# Patient Record
Sex: Female | Born: 1955
Health system: Southern US, Community
[De-identification: ages and names within clinical notes are randomized; demographics above are authoritative.]

## PROBLEM LIST (undated history)

## (undated) DIAGNOSIS — I739 Peripheral vascular disease, unspecified: Secondary | ICD-10-CM

## (undated) DIAGNOSIS — R519 Headache, unspecified: Secondary | ICD-10-CM

## (undated) DIAGNOSIS — M1712 Unilateral primary osteoarthritis, left knee: Secondary | ICD-10-CM

## (undated) DIAGNOSIS — G4730 Sleep apnea, unspecified: Secondary | ICD-10-CM

## (undated) DIAGNOSIS — M545 Low back pain, unspecified: Secondary | ICD-10-CM

## (undated) DIAGNOSIS — E785 Hyperlipidemia, unspecified: Secondary | ICD-10-CM

## (undated) DIAGNOSIS — N289 Disorder of kidney and ureter, unspecified: Secondary | ICD-10-CM

## (undated) DIAGNOSIS — M1990 Unspecified osteoarthritis, unspecified site: Secondary | ICD-10-CM

## (undated) DIAGNOSIS — E119 Type 2 diabetes mellitus without complications: Secondary | ICD-10-CM

## (undated) DIAGNOSIS — K219 Gastro-esophageal reflux disease without esophagitis: Secondary | ICD-10-CM

## (undated) DIAGNOSIS — Z950 Presence of cardiac pacemaker: Secondary | ICD-10-CM

## (undated) DIAGNOSIS — Z973 Presence of spectacles and contact lenses: Secondary | ICD-10-CM

## (undated) DIAGNOSIS — F329 Major depressive disorder, single episode, unspecified: Secondary | ICD-10-CM

## (undated) DIAGNOSIS — E1149 Type 2 diabetes mellitus with other diabetic neurological complication: Secondary | ICD-10-CM

## (undated) DIAGNOSIS — Z862 Personal history of diseases of the blood and blood-forming organs and certain disorders involving the immune mechanism: Secondary | ICD-10-CM

## (undated) DIAGNOSIS — K7581 Nonalcoholic steatohepatitis (NASH): Secondary | ICD-10-CM

## (undated) DIAGNOSIS — Z9289 Personal history of other medical treatment: Secondary | ICD-10-CM

## (undated) DIAGNOSIS — M7500 Adhesive capsulitis of unspecified shoulder: Secondary | ICD-10-CM

## (undated) DIAGNOSIS — E042 Nontoxic multinodular goiter: Secondary | ICD-10-CM

## (undated) DIAGNOSIS — F419 Anxiety disorder, unspecified: Secondary | ICD-10-CM

## (undated) DIAGNOSIS — G4733 Obstructive sleep apnea (adult) (pediatric): Secondary | ICD-10-CM

## (undated) DIAGNOSIS — L408 Other psoriasis: Secondary | ICD-10-CM

## (undated) DIAGNOSIS — D649 Anemia, unspecified: Secondary | ICD-10-CM

## (undated) DIAGNOSIS — E7800 Pure hypercholesterolemia, unspecified: Secondary | ICD-10-CM

## (undated) DIAGNOSIS — E1140 Type 2 diabetes mellitus with diabetic neuropathy, unspecified: Secondary | ICD-10-CM

## (undated) DIAGNOSIS — T7840XA Allergy, unspecified, initial encounter: Secondary | ICD-10-CM

## (undated) DIAGNOSIS — G609 Hereditary and idiopathic neuropathy, unspecified: Secondary | ICD-10-CM

## (undated) DIAGNOSIS — I872 Venous insufficiency (chronic) (peripheral): Secondary | ICD-10-CM

## (undated) DIAGNOSIS — H269 Unspecified cataract: Secondary | ICD-10-CM

## (undated) DIAGNOSIS — Z86010 Personal history of colonic polyps: Secondary | ICD-10-CM

## (undated) DIAGNOSIS — Z972 Presence of dental prosthetic device (complete) (partial): Secondary | ICD-10-CM

## (undated) DIAGNOSIS — K08109 Complete loss of teeth, unspecified cause, unspecified class: Secondary | ICD-10-CM

## (undated) DIAGNOSIS — Z800 Family history of malignant neoplasm of digestive organs: Secondary | ICD-10-CM

## (undated) DIAGNOSIS — Z780 Asymptomatic menopausal state: Secondary | ICD-10-CM

## (undated) DIAGNOSIS — J189 Pneumonia, unspecified organism: Secondary | ICD-10-CM

## (undated) DIAGNOSIS — I10 Essential (primary) hypertension: Secondary | ICD-10-CM

## (undated) DIAGNOSIS — R151 Fecal smearing: Secondary | ICD-10-CM

## (undated) DIAGNOSIS — L03116 Cellulitis of left lower limb: Secondary | ICD-10-CM

## (undated) DIAGNOSIS — R159 Full incontinence of feces: Secondary | ICD-10-CM

## (undated) DIAGNOSIS — R51 Headache: Secondary | ICD-10-CM

## (undated) DIAGNOSIS — L03115 Cellulitis of right lower limb: Secondary | ICD-10-CM

## (undated) HISTORY — DX: Hyperlipidemia, unspecified: E78.5

## (undated) HISTORY — DX: Unspecified cataract: H26.9

## (undated) HISTORY — DX: Family history of malignant neoplasm of digestive organs: Z80.0

## (undated) HISTORY — DX: Presence of cardiac pacemaker: Z95.0

## (undated) HISTORY — DX: Hereditary and idiopathic neuropathy, unspecified: G60.9

## (undated) HISTORY — DX: Anxiety disorder, unspecified: F41.9

## (undated) HISTORY — DX: Personal history of colonic polyps: Z86.010

## (undated) HISTORY — DX: Other psoriasis: L40.8

## (undated) HISTORY — DX: Unilateral primary osteoarthritis, left knee: M17.12

## (undated) HISTORY — PX: LEG SURGERY: SHX1003

## (undated) HISTORY — PX: FLEXIBLE SIGMOIDOSCOPY: SHX1649

## (undated) HISTORY — DX: Nonalcoholic steatohepatitis (NASH): K75.81

## (undated) HISTORY — PX: OTHER SURGICAL HISTORY: SHX169

## (undated) HISTORY — PX: JOINT REPLACEMENT: SHX530

## (undated) HISTORY — DX: Venous insufficiency (chronic) (peripheral): I87.2

## (undated) HISTORY — DX: Pure hypercholesterolemia, unspecified: E78.00

## (undated) HISTORY — DX: Type 2 diabetes mellitus with other diabetic neurological complication: E11.49

## (undated) HISTORY — DX: Cellulitis of left lower limb: L03.116

## (undated) HISTORY — PX: VARICOSE VEIN SURGERY: SHX832

## (undated) HISTORY — DX: Unspecified osteoarthritis, unspecified site: M19.90

## (undated) HISTORY — DX: Essential (primary) hypertension: I10

## (undated) HISTORY — PX: COLONOSCOPY: SHX174

## (undated) HISTORY — DX: Obstructive sleep apnea (adult) (pediatric): G47.33

## (undated) HISTORY — DX: Allergy, unspecified, initial encounter: T78.40XA

## (undated) HISTORY — PX: GASTRIC BYPASS: SHX52

## (undated) HISTORY — PX: CATARACT EXTRACTION W/ INTRAOCULAR LENS  IMPLANT, BILATERAL: SHX1307

## (undated) HISTORY — DX: Nontoxic multinodular goiter: E04.2

## (undated) HISTORY — DX: Sleep apnea, unspecified: G47.30

## (undated) HISTORY — PX: MULTIPLE TOOTH EXTRACTIONS: SHX2053

## (undated) HISTORY — PX: SHOULDER ARTHROSCOPY W/ ROTATOR CUFF REPAIR: SHX2400

## (undated) HISTORY — DX: Cellulitis of right lower limb: L03.115

## (undated) HISTORY — PX: TONSILLECTOMY: SUR1361

## (undated) HISTORY — DX: Personal history of diseases of the blood and blood-forming organs and certain disorders involving the immune mechanism: Z86.2

## (undated) HISTORY — DX: Major depressive disorder, single episode, unspecified: F32.9

## (undated) HISTORY — DX: Low back pain: M54.5

## (undated) HISTORY — DX: Asymptomatic menopausal state: Z78.0

---

## 1998-10-16 ENCOUNTER — Encounter: Admission: RE | Admit: 1998-10-16 | Discharge: 1999-01-14 | Payer: Self-pay | Admitting: Family Medicine

## 1999-07-06 ENCOUNTER — Other Ambulatory Visit: Admission: RE | Admit: 1999-07-06 | Discharge: 1999-07-06 | Payer: Self-pay | Admitting: Obstetrics and Gynecology

## 1999-08-13 ENCOUNTER — Encounter: Payer: Self-pay | Admitting: Obstetrics and Gynecology

## 1999-08-13 ENCOUNTER — Ambulatory Visit (HOSPITAL_COMMUNITY): Admission: RE | Admit: 1999-08-13 | Discharge: 1999-08-13 | Payer: Self-pay | Admitting: Obstetrics and Gynecology

## 2000-05-07 ENCOUNTER — Encounter: Payer: Self-pay | Admitting: Orthopedic Surgery

## 2000-05-07 ENCOUNTER — Ambulatory Visit (HOSPITAL_COMMUNITY): Admission: RE | Admit: 2000-05-07 | Discharge: 2000-05-07 | Payer: Self-pay | Admitting: Orthopedic Surgery

## 2000-05-22 ENCOUNTER — Encounter: Payer: Self-pay | Admitting: Orthopedic Surgery

## 2000-05-22 ENCOUNTER — Ambulatory Visit (HOSPITAL_COMMUNITY): Admission: RE | Admit: 2000-05-22 | Discharge: 2000-05-22 | Payer: Self-pay | Admitting: Orthopedic Surgery

## 2000-05-22 ENCOUNTER — Ambulatory Visit (HOSPITAL_COMMUNITY): Admission: RE | Admit: 2000-05-22 | Discharge: 2000-05-22 | Payer: Self-pay | Admitting: Gastroenterology

## 2000-05-22 ENCOUNTER — Encounter: Payer: Self-pay | Admitting: Gastroenterology

## 2000-05-29 ENCOUNTER — Encounter: Admission: RE | Admit: 2000-05-29 | Discharge: 2000-08-27 | Payer: Self-pay | Admitting: Family Medicine

## 2000-06-23 ENCOUNTER — Encounter: Admission: RE | Admit: 2000-06-23 | Discharge: 2000-09-21 | Payer: Self-pay | Admitting: Orthopedic Surgery

## 2000-10-22 ENCOUNTER — Encounter: Admission: RE | Admit: 2000-10-22 | Discharge: 2000-11-12 | Payer: Self-pay | Admitting: Orthopedic Surgery

## 2001-01-23 ENCOUNTER — Encounter: Payer: Self-pay | Admitting: Obstetrics and Gynecology

## 2001-01-23 ENCOUNTER — Ambulatory Visit (HOSPITAL_COMMUNITY): Admission: RE | Admit: 2001-01-23 | Discharge: 2001-01-23 | Payer: Self-pay | Admitting: Obstetrics and Gynecology

## 2001-06-24 HISTORY — PX: KNEE ARTHROSCOPY: SUR90

## 2004-06-24 HISTORY — PX: CARDIAC CATHETERIZATION: SHX172

## 2004-09-24 ENCOUNTER — Ambulatory Visit: Payer: Self-pay | Admitting: Endocrinology

## 2004-09-25 ENCOUNTER — Encounter: Admission: RE | Admit: 2004-09-25 | Discharge: 2004-12-24 | Payer: Self-pay | Admitting: Endocrinology

## 2004-09-26 ENCOUNTER — Ambulatory Visit (HOSPITAL_COMMUNITY): Admission: RE | Admit: 2004-09-26 | Discharge: 2004-09-26 | Payer: Self-pay | Admitting: Endocrinology

## 2004-10-09 ENCOUNTER — Ambulatory Visit: Payer: Self-pay | Admitting: Endocrinology

## 2004-10-23 ENCOUNTER — Ambulatory Visit: Payer: Self-pay | Admitting: Endocrinology

## 2004-11-13 ENCOUNTER — Ambulatory Visit: Payer: Self-pay | Admitting: Endocrinology

## 2004-11-29 ENCOUNTER — Ambulatory Visit (HOSPITAL_COMMUNITY): Admission: RE | Admit: 2004-11-29 | Discharge: 2004-11-29 | Payer: Self-pay | Admitting: Family Medicine

## 2004-12-04 ENCOUNTER — Ambulatory Visit: Payer: Self-pay | Admitting: Endocrinology

## 2005-01-15 ENCOUNTER — Ambulatory Visit: Payer: Self-pay | Admitting: Endocrinology

## 2005-01-18 ENCOUNTER — Ambulatory Visit: Payer: Self-pay | Admitting: Endocrinology

## 2005-01-24 ENCOUNTER — Ambulatory Visit: Payer: Self-pay

## 2005-01-24 HISTORY — PX: OTHER SURGICAL HISTORY: SHX169

## 2005-02-08 ENCOUNTER — Ambulatory Visit: Payer: Self-pay | Admitting: Cardiology

## 2005-02-12 ENCOUNTER — Ambulatory Visit: Payer: Self-pay | Admitting: Endocrinology

## 2005-02-15 ENCOUNTER — Inpatient Hospital Stay (HOSPITAL_BASED_OUTPATIENT_CLINIC_OR_DEPARTMENT_OTHER): Admission: RE | Admit: 2005-02-15 | Discharge: 2005-02-15 | Payer: Self-pay

## 2005-02-15 ENCOUNTER — Ambulatory Visit: Payer: Self-pay | Admitting: Cardiology

## 2005-03-15 ENCOUNTER — Ambulatory Visit: Payer: Self-pay | Admitting: Endocrinology

## 2005-06-05 ENCOUNTER — Ambulatory Visit: Payer: Self-pay | Admitting: Internal Medicine

## 2005-08-06 ENCOUNTER — Emergency Department (HOSPITAL_COMMUNITY): Admission: EM | Admit: 2005-08-06 | Discharge: 2005-08-07 | Payer: Self-pay | Admitting: Emergency Medicine

## 2005-08-20 ENCOUNTER — Ambulatory Visit: Payer: Self-pay | Admitting: Gastroenterology

## 2005-08-21 ENCOUNTER — Ambulatory Visit: Payer: Self-pay | Admitting: Endocrinology

## 2005-08-25 ENCOUNTER — Emergency Department (HOSPITAL_COMMUNITY): Admission: EM | Admit: 2005-08-25 | Discharge: 2005-08-25 | Payer: Self-pay | Admitting: Emergency Medicine

## 2005-09-05 ENCOUNTER — Ambulatory Visit: Payer: Self-pay | Admitting: Endocrinology

## 2005-09-12 ENCOUNTER — Encounter (INDEPENDENT_AMBULATORY_CARE_PROVIDER_SITE_OTHER): Payer: Self-pay | Admitting: Specialist

## 2005-09-12 ENCOUNTER — Ambulatory Visit: Payer: Self-pay | Admitting: Gastroenterology

## 2006-02-26 ENCOUNTER — Ambulatory Visit: Payer: Self-pay | Admitting: Endocrinology

## 2006-03-05 ENCOUNTER — Ambulatory Visit: Payer: Self-pay | Admitting: Endocrinology

## 2006-03-12 ENCOUNTER — Ambulatory Visit: Payer: Self-pay | Admitting: Endocrinology

## 2006-03-12 ENCOUNTER — Other Ambulatory Visit: Admission: RE | Admit: 2006-03-12 | Discharge: 2006-03-12 | Payer: Self-pay | Admitting: Obstetrics and Gynecology

## 2006-04-09 ENCOUNTER — Ambulatory Visit: Payer: Self-pay | Admitting: Endocrinology

## 2006-04-09 LAB — CONVERTED CEMR LAB
Albumin: 3.6 g/dL (ref 3.5–5.2)
Alkaline Phosphatase: 123 units/L — ABNORMAL HIGH (ref 39–117)
BUN: 21 mg/dL (ref 6–23)
Basophils Absolute: 0 10*3/uL (ref 0.0–0.1)
Bilirubin Urine: NEGATIVE
Cholesterol: 163 mg/dL (ref 0–200)
Creatinine, Ser: 1.1 mg/dL (ref 0.4–1.2)
Creatinine,U: 53.2 mg/dL
Glomerular Filtration Rate, Af Am: 67 mL/min/{1.73_m2}
HDL: 37.9 mg/dL — ABNORMAL LOW (ref >39.0)
Ketones, ur: NEGATIVE mg/dL
Leukocytes, UA: NEGATIVE
Lymphocytes Relative: 20.4 % (ref 12.0–46.0)
MCHC: 33.4 g/dL (ref 30.0–36.0)
MCV: 81.5 fL (ref 78.0–100.0)
Microalb Creat Ratio: 13.2 mg/g (ref 0.0–30.0)
Monocytes Absolute: 0.5 10*3/uL (ref 0.2–0.7)
Nitrite: NEGATIVE
Platelets: 260 10*3/uL (ref 150–400)
TSH: 3.4 microintl units/mL (ref 0.35–5.50)
Total Bilirubin: 0.6 mg/dL (ref 0.3–1.2)
Total Protein, Urine: NEGATIVE mg/dL
Total Protein: 7.1 g/dL (ref 6.0–8.3)

## 2006-04-16 ENCOUNTER — Ambulatory Visit: Payer: Self-pay | Admitting: Endocrinology

## 2006-04-16 HISTORY — PX: ELECTROCARDIOGRAM: SHX264

## 2006-05-14 ENCOUNTER — Ambulatory Visit: Payer: Self-pay | Admitting: Endocrinology

## 2006-06-11 ENCOUNTER — Ambulatory Visit: Payer: Self-pay | Admitting: Endocrinology

## 2006-06-24 HISTORY — PX: ROTATOR CUFF REPAIR: SHX139

## 2006-07-09 ENCOUNTER — Ambulatory Visit: Payer: Self-pay | Admitting: Endocrinology

## 2006-07-12 ENCOUNTER — Ambulatory Visit: Payer: Self-pay | Admitting: Family Medicine

## 2006-07-15 ENCOUNTER — Ambulatory Visit: Payer: Self-pay | Admitting: Endocrinology

## 2006-07-30 ENCOUNTER — Ambulatory Visit: Payer: Self-pay | Admitting: Endocrinology

## 2006-09-03 ENCOUNTER — Encounter: Payer: Self-pay | Admitting: Vascular Surgery

## 2006-09-03 ENCOUNTER — Ambulatory Visit: Payer: Self-pay | Admitting: Vascular Surgery

## 2006-09-03 ENCOUNTER — Ambulatory Visit (HOSPITAL_COMMUNITY): Admission: RE | Admit: 2006-09-03 | Discharge: 2006-09-03 | Payer: Self-pay | Admitting: Endocrinology

## 2006-09-03 ENCOUNTER — Ambulatory Visit: Payer: Self-pay | Admitting: Endocrinology

## 2006-09-10 ENCOUNTER — Ambulatory Visit: Payer: Self-pay | Admitting: Endocrinology

## 2006-10-08 ENCOUNTER — Ambulatory Visit: Payer: Self-pay | Admitting: Endocrinology

## 2006-10-08 LAB — CONVERTED CEMR LAB
ALT: 19 units/L (ref 0–40)
Albumin: 3.6 g/dL (ref 3.5–5.2)
HDL: 40.2 mg/dL (ref >39.0)
Hgb A1c MFr Bld: 11 % — ABNORMAL HIGH (ref 4.6–6.0)
Total CHOL/HDL Ratio: 4.3
Total Protein: 7.6 g/dL (ref 6.0–8.3)
Triglycerides: 210 mg/dL (ref 0–149)
VLDL: 42 mg/dL — ABNORMAL HIGH (ref 0–40)

## 2006-12-12 ENCOUNTER — Ambulatory Visit: Payer: Self-pay | Admitting: Endocrinology

## 2006-12-12 LAB — CONVERTED CEMR LAB
ALT: 21 units/L (ref 0–40)
AST: 18 units/L (ref 0–37)
Alkaline Phosphatase: 154 units/L — ABNORMAL HIGH (ref 39–117)
Bilirubin, Direct: 0.1 mg/dL (ref 0.0–0.3)
Hgb A1c MFr Bld: 11.7 % — ABNORMAL HIGH (ref 4.6–6.0)
LDL Cholesterol: 98 mg/dL (ref 0–99)
Total Bilirubin: 0.3 mg/dL (ref 0.3–1.2)
Total CHOL/HDL Ratio: 4.8

## 2006-12-17 ENCOUNTER — Ambulatory Visit: Payer: Self-pay | Admitting: Endocrinology

## 2006-12-24 ENCOUNTER — Ambulatory Visit: Payer: Self-pay | Admitting: Internal Medicine

## 2007-01-12 ENCOUNTER — Ambulatory Visit: Payer: Self-pay | Admitting: Endocrinology

## 2007-01-19 ENCOUNTER — Encounter: Payer: Self-pay | Admitting: Endocrinology

## 2007-03-24 ENCOUNTER — Ambulatory Visit: Payer: Self-pay | Admitting: Pulmonary Disease

## 2007-04-06 ENCOUNTER — Encounter: Payer: Self-pay | Admitting: Pulmonary Disease

## 2007-04-06 ENCOUNTER — Ambulatory Visit (HOSPITAL_BASED_OUTPATIENT_CLINIC_OR_DEPARTMENT_OTHER): Admission: RE | Admit: 2007-04-06 | Discharge: 2007-04-06 | Payer: Self-pay | Admitting: Pulmonary Disease

## 2007-04-10 ENCOUNTER — Ambulatory Visit: Payer: Self-pay | Admitting: Endocrinology

## 2007-04-14 ENCOUNTER — Ambulatory Visit: Payer: Self-pay | Admitting: Pulmonary Disease

## 2007-04-23 ENCOUNTER — Telehealth: Payer: Self-pay | Admitting: Endocrinology

## 2007-05-08 ENCOUNTER — Ambulatory Visit: Payer: Self-pay | Admitting: Pulmonary Disease

## 2007-05-12 ENCOUNTER — Ambulatory Visit: Payer: Self-pay | Admitting: Endocrinology

## 2007-05-12 DIAGNOSIS — M79609 Pain in unspecified limb: Secondary | ICD-10-CM

## 2007-05-13 ENCOUNTER — Encounter: Payer: Self-pay | Admitting: Pulmonary Disease

## 2007-05-13 DIAGNOSIS — E042 Nontoxic multinodular goiter: Secondary | ICD-10-CM | POA: Insufficient documentation

## 2007-05-13 DIAGNOSIS — Z862 Personal history of diseases of the blood and blood-forming organs and certain disorders involving the immune mechanism: Secondary | ICD-10-CM | POA: Insufficient documentation

## 2007-05-13 DIAGNOSIS — E785 Hyperlipidemia, unspecified: Secondary | ICD-10-CM

## 2007-05-13 HISTORY — DX: Nontoxic multinodular goiter: E04.2

## 2007-05-13 HISTORY — DX: Hyperlipidemia, unspecified: E78.5

## 2007-05-13 HISTORY — DX: Personal history of diseases of the blood and blood-forming organs and certain disorders involving the immune mechanism: Z86.2

## 2007-06-02 ENCOUNTER — Encounter: Payer: Self-pay | Admitting: Endocrinology

## 2007-06-02 ENCOUNTER — Ambulatory Visit: Payer: Self-pay

## 2007-06-12 ENCOUNTER — Ambulatory Visit: Payer: Self-pay | Admitting: Pulmonary Disease

## 2007-06-12 DIAGNOSIS — G4733 Obstructive sleep apnea (adult) (pediatric): Secondary | ICD-10-CM

## 2007-06-12 DIAGNOSIS — E669 Obesity, unspecified: Secondary | ICD-10-CM | POA: Insufficient documentation

## 2007-06-12 HISTORY — DX: Obstructive sleep apnea (adult) (pediatric): G47.33

## 2007-07-06 ENCOUNTER — Encounter: Payer: Self-pay | Admitting: Pulmonary Disease

## 2007-09-14 ENCOUNTER — Emergency Department (HOSPITAL_COMMUNITY): Admission: EM | Admit: 2007-09-14 | Discharge: 2007-09-15 | Payer: Self-pay | Admitting: Emergency Medicine

## 2007-09-14 ENCOUNTER — Telehealth (INDEPENDENT_AMBULATORY_CARE_PROVIDER_SITE_OTHER): Payer: Self-pay | Admitting: *Deleted

## 2007-09-15 ENCOUNTER — Encounter (INDEPENDENT_AMBULATORY_CARE_PROVIDER_SITE_OTHER): Payer: Self-pay | Admitting: *Deleted

## 2007-09-15 ENCOUNTER — Ambulatory Visit: Payer: Self-pay | Admitting: Endocrinology

## 2007-09-15 DIAGNOSIS — M25569 Pain in unspecified knee: Secondary | ICD-10-CM

## 2007-09-15 DIAGNOSIS — R51 Headache: Secondary | ICD-10-CM

## 2007-09-15 DIAGNOSIS — R519 Headache, unspecified: Secondary | ICD-10-CM | POA: Insufficient documentation

## 2007-09-18 ENCOUNTER — Telehealth (INDEPENDENT_AMBULATORY_CARE_PROVIDER_SITE_OTHER): Payer: Self-pay | Admitting: *Deleted

## 2007-09-21 ENCOUNTER — Ambulatory Visit: Payer: Self-pay | Admitting: Endocrinology

## 2007-09-22 LAB — CONVERTED CEMR LAB: Sed Rate: 67 mm/hr — ABNORMAL HIGH (ref 0–22)

## 2007-09-23 ENCOUNTER — Telehealth: Payer: Self-pay | Admitting: Endocrinology

## 2007-09-23 ENCOUNTER — Encounter: Payer: Self-pay | Admitting: Endocrinology

## 2007-09-24 ENCOUNTER — Telehealth: Payer: Self-pay | Admitting: Endocrinology

## 2007-09-28 ENCOUNTER — Ambulatory Visit: Payer: Self-pay | Admitting: Endocrinology

## 2007-10-06 ENCOUNTER — Inpatient Hospital Stay (HOSPITAL_COMMUNITY): Admission: AD | Admit: 2007-10-06 | Discharge: 2007-10-09 | Payer: Self-pay | Admitting: Internal Medicine

## 2007-10-06 ENCOUNTER — Ambulatory Visit: Payer: Self-pay | Admitting: Internal Medicine

## 2007-10-06 DIAGNOSIS — IMO0002 Reserved for concepts with insufficient information to code with codable children: Secondary | ICD-10-CM

## 2007-10-06 DIAGNOSIS — F329 Major depressive disorder, single episode, unspecified: Secondary | ICD-10-CM

## 2007-10-06 DIAGNOSIS — F3289 Other specified depressive episodes: Secondary | ICD-10-CM

## 2007-10-06 DIAGNOSIS — F32A Depression, unspecified: Secondary | ICD-10-CM | POA: Insufficient documentation

## 2007-10-06 HISTORY — DX: Major depressive disorder, single episode, unspecified: F32.9

## 2007-10-06 HISTORY — DX: Depression, unspecified: F32.A

## 2007-10-06 HISTORY — DX: Other specified depressive episodes: F32.89

## 2007-10-07 ENCOUNTER — Encounter (INDEPENDENT_AMBULATORY_CARE_PROVIDER_SITE_OTHER): Payer: Self-pay | Admitting: General Surgery

## 2007-10-16 ENCOUNTER — Ambulatory Visit: Payer: Self-pay | Admitting: Endocrinology

## 2007-10-29 ENCOUNTER — Encounter: Payer: Self-pay | Admitting: Endocrinology

## 2007-11-19 ENCOUNTER — Ambulatory Visit: Payer: Self-pay | Admitting: Endocrinology

## 2007-11-19 DIAGNOSIS — M545 Low back pain, unspecified: Secondary | ICD-10-CM

## 2007-11-19 DIAGNOSIS — M25519 Pain in unspecified shoulder: Secondary | ICD-10-CM

## 2007-11-19 HISTORY — DX: Low back pain, unspecified: M54.50

## 2007-11-20 ENCOUNTER — Encounter: Payer: Self-pay | Admitting: Endocrinology

## 2007-11-23 ENCOUNTER — Telehealth (INDEPENDENT_AMBULATORY_CARE_PROVIDER_SITE_OTHER): Payer: Self-pay | Admitting: *Deleted

## 2007-11-30 ENCOUNTER — Telehealth (INDEPENDENT_AMBULATORY_CARE_PROVIDER_SITE_OTHER): Payer: Self-pay | Admitting: *Deleted

## 2007-12-03 ENCOUNTER — Encounter: Payer: Self-pay | Admitting: Endocrinology

## 2007-12-10 ENCOUNTER — Ambulatory Visit (HOSPITAL_COMMUNITY): Admission: RE | Admit: 2007-12-10 | Discharge: 2007-12-11 | Payer: Self-pay | Admitting: Orthopedic Surgery

## 2007-12-17 ENCOUNTER — Ambulatory Visit: Payer: Self-pay | Admitting: Endocrinology

## 2007-12-24 ENCOUNTER — Encounter: Admission: RE | Admit: 2007-12-24 | Discharge: 2007-12-24 | Payer: Self-pay | Admitting: Neurology

## 2008-01-25 ENCOUNTER — Ambulatory Visit (HOSPITAL_COMMUNITY): Admission: RE | Admit: 2008-01-25 | Discharge: 2008-01-25 | Payer: Self-pay | Admitting: Obstetrics and Gynecology

## 2008-01-25 LAB — HM MAMMOGRAPHY: HM Mammogram: NORMAL

## 2008-01-29 ENCOUNTER — Encounter (INDEPENDENT_AMBULATORY_CARE_PROVIDER_SITE_OTHER): Payer: Self-pay | Admitting: *Deleted

## 2008-01-29 ENCOUNTER — Ambulatory Visit: Payer: Self-pay | Admitting: Endocrinology

## 2008-01-29 DIAGNOSIS — R209 Unspecified disturbances of skin sensation: Secondary | ICD-10-CM

## 2008-01-31 ENCOUNTER — Encounter: Admission: RE | Admit: 2008-01-31 | Discharge: 2008-01-31 | Payer: Self-pay | Admitting: Orthopedic Surgery

## 2008-02-02 ENCOUNTER — Encounter: Payer: Self-pay | Admitting: Endocrinology

## 2008-02-03 ENCOUNTER — Telehealth (INDEPENDENT_AMBULATORY_CARE_PROVIDER_SITE_OTHER): Payer: Self-pay | Admitting: *Deleted

## 2008-02-05 ENCOUNTER — Telehealth (INDEPENDENT_AMBULATORY_CARE_PROVIDER_SITE_OTHER): Payer: Self-pay | Admitting: *Deleted

## 2008-02-10 ENCOUNTER — Encounter: Payer: Self-pay | Admitting: Endocrinology

## 2008-02-11 ENCOUNTER — Ambulatory Visit: Payer: Self-pay | Admitting: Endocrinology

## 2008-02-11 LAB — CONVERTED CEMR LAB
ALT: 41 units/L — ABNORMAL HIGH (ref 0–35)
AST: 58 units/L — ABNORMAL HIGH (ref 0–37)
Alkaline Phosphatase: 167 units/L — ABNORMAL HIGH (ref 39–117)
Calcium: 9.6 mg/dL (ref 8.4–10.5)
Chloride: 104 meq/L (ref 96–112)
Creatinine, Ser: 1.1 mg/dL (ref 0.4–1.2)
Creatinine,U: 133.7 mg/dL
Eosinophils Absolute: 0.2 10*3/uL (ref 0.0–0.7)
Eosinophils Relative: 3.1 % (ref 0.0–5.0)
HCT: 41 % (ref 36.0–46.0)
Hemoglobin, Urine: NEGATIVE
Leukocytes, UA: NEGATIVE
Lymphocytes Relative: 25.8 % (ref 12.0–46.0)
Microalb Creat Ratio: 28.4 mg/g (ref 0.0–30.0)
Monocytes Relative: 7.4 % (ref 3.0–12.0)
Neutro Abs: 4.9 10*3/uL (ref 1.4–7.7)
Nitrite: NEGATIVE
Potassium: 3.4 meq/L — ABNORMAL LOW (ref 3.5–5.1)
Sodium: 141 meq/L (ref 135–145)
Specific Gravity, Urine: 1.025 (ref 1.000–1.03)
Total Bilirubin: 0.5 mg/dL (ref 0.3–1.2)
Total Protein, Urine: NEGATIVE mg/dL
Triglycerides: 185 mg/dL — ABNORMAL HIGH (ref 0–149)
Urine Glucose: NEGATIVE mg/dL
VLDL: 37 mg/dL (ref 0–40)

## 2008-02-12 ENCOUNTER — Encounter: Payer: Self-pay | Admitting: Internal Medicine

## 2008-02-15 LAB — CONVERTED CEMR LAB
HCV Ab: NEGATIVE
Hep A IgM: NEGATIVE

## 2008-02-18 ENCOUNTER — Telehealth: Payer: Self-pay | Admitting: Endocrinology

## 2008-02-18 ENCOUNTER — Encounter: Payer: Self-pay | Admitting: Endocrinology

## 2008-02-18 ENCOUNTER — Ambulatory Visit: Payer: Self-pay | Admitting: Endocrinology

## 2008-02-18 DIAGNOSIS — Z780 Asymptomatic menopausal state: Secondary | ICD-10-CM | POA: Insufficient documentation

## 2008-02-18 DIAGNOSIS — E1140 Type 2 diabetes mellitus with diabetic neuropathy, unspecified: Secondary | ICD-10-CM | POA: Insufficient documentation

## 2008-02-18 DIAGNOSIS — E1149 Type 2 diabetes mellitus with other diabetic neurological complication: Secondary | ICD-10-CM

## 2008-02-18 HISTORY — DX: Asymptomatic menopausal state: Z78.0

## 2008-02-18 HISTORY — DX: Type 2 diabetes mellitus with diabetic neuropathy, unspecified: E11.40

## 2008-02-18 HISTORY — DX: Type 2 diabetes mellitus with other diabetic neurological complication: E11.49

## 2008-02-22 ENCOUNTER — Ambulatory Visit (HOSPITAL_COMMUNITY): Admission: RE | Admit: 2008-02-22 | Discharge: 2008-02-22 | Payer: Self-pay | Admitting: Endocrinology

## 2008-02-22 ENCOUNTER — Telehealth (INDEPENDENT_AMBULATORY_CARE_PROVIDER_SITE_OTHER): Payer: Self-pay | Admitting: *Deleted

## 2008-02-22 ENCOUNTER — Encounter: Payer: Self-pay | Admitting: Endocrinology

## 2008-03-07 ENCOUNTER — Encounter: Payer: Self-pay | Admitting: Endocrinology

## 2008-04-22 ENCOUNTER — Emergency Department (HOSPITAL_COMMUNITY): Admission: EM | Admit: 2008-04-22 | Discharge: 2008-04-22 | Payer: Self-pay | Admitting: Emergency Medicine

## 2008-04-28 ENCOUNTER — Telehealth (INDEPENDENT_AMBULATORY_CARE_PROVIDER_SITE_OTHER): Payer: Self-pay | Admitting: *Deleted

## 2008-08-19 ENCOUNTER — Telehealth (INDEPENDENT_AMBULATORY_CARE_PROVIDER_SITE_OTHER): Payer: Self-pay | Admitting: *Deleted

## 2008-08-22 ENCOUNTER — Telehealth (INDEPENDENT_AMBULATORY_CARE_PROVIDER_SITE_OTHER): Payer: Self-pay | Admitting: *Deleted

## 2008-11-04 ENCOUNTER — Telehealth (INDEPENDENT_AMBULATORY_CARE_PROVIDER_SITE_OTHER): Payer: Self-pay | Admitting: *Deleted

## 2008-11-08 ENCOUNTER — Ambulatory Visit: Payer: Self-pay | Admitting: Endocrinology

## 2008-12-16 ENCOUNTER — Telehealth (INDEPENDENT_AMBULATORY_CARE_PROVIDER_SITE_OTHER): Payer: Self-pay | Admitting: *Deleted

## 2008-12-20 ENCOUNTER — Telehealth: Payer: Self-pay | Admitting: Endocrinology

## 2009-01-03 ENCOUNTER — Telehealth: Payer: Self-pay | Admitting: Endocrinology

## 2009-01-12 ENCOUNTER — Other Ambulatory Visit: Admission: RE | Admit: 2009-01-12 | Discharge: 2009-01-12 | Payer: Self-pay | Admitting: Internal Medicine

## 2009-01-12 ENCOUNTER — Ambulatory Visit: Payer: Self-pay | Admitting: Internal Medicine

## 2009-01-12 ENCOUNTER — Encounter: Payer: Self-pay | Admitting: Internal Medicine

## 2009-01-12 DIAGNOSIS — G609 Hereditary and idiopathic neuropathy, unspecified: Secondary | ICD-10-CM

## 2009-01-12 DIAGNOSIS — B373 Candidiasis of vulva and vagina: Secondary | ICD-10-CM

## 2009-01-12 DIAGNOSIS — G629 Polyneuropathy, unspecified: Secondary | ICD-10-CM

## 2009-01-12 HISTORY — DX: Polyneuropathy, unspecified: G62.9

## 2009-01-12 HISTORY — DX: Hereditary and idiopathic neuropathy, unspecified: G60.9

## 2009-01-12 LAB — CONVERTED CEMR LAB
AST: 31 units/L (ref 0–37)
BUN: 17 mg/dL (ref 6–23)
Basophils Relative: 0.6 % (ref 0.0–3.0)
Bilirubin, Direct: 0.1 mg/dL (ref 0.0–0.3)
Chloride: 106 meq/L (ref 96–112)
Cholesterol: 185 mg/dL (ref 0–200)
Creatinine, Ser: 0.9 mg/dL (ref 0.4–1.2)
Direct LDL: 110.9 mg/dL
Eosinophils Absolute: 0.2 10*3/uL (ref 0.0–0.7)
Eosinophils Relative: 3.4 % (ref 0.0–5.0)
Folate: 16.9 ng/mL
HDL: 35.9 mg/dL — ABNORMAL LOW (ref >39.00)
Hgb A1c MFr Bld: 12.3 % — ABNORMAL HIGH (ref 4.6–6.5)
Leukocytes, UA: NEGATIVE
Microalb Creat Ratio: 25 mg/g (ref 0.0–30.0)
Monocytes Relative: 7.5 % (ref 3.0–12.0)
Platelets: 246 10*3/uL (ref 150.0–400.0)
Potassium: 3.6 meq/L (ref 3.5–5.1)
RBC: 4.87 M/uL (ref 3.87–5.11)
RDW: 14.4 % (ref 11.5–14.6)
Sodium: 141 meq/L (ref 135–145)
TSH: 2.74 microintl units/mL (ref 0.35–5.50)
Total Protein: 6.8 g/dL (ref 6.0–8.3)
Triglycerides: 268 mg/dL — ABNORMAL HIGH (ref 0.0–149.0)
Urobilinogen, UA: 0.2 (ref 0.0–1.0)
VLDL: 53.6 mg/dL — ABNORMAL HIGH (ref 0.0–40.0)
Vitamin B-12: 301 pg/mL (ref 211–911)
pH: 5.5 (ref 5.0–8.0)

## 2009-01-17 ENCOUNTER — Encounter: Payer: Self-pay | Admitting: Internal Medicine

## 2009-02-14 ENCOUNTER — Telehealth: Payer: Self-pay | Admitting: Endocrinology

## 2009-03-20 ENCOUNTER — Telehealth: Payer: Self-pay | Admitting: Endocrinology

## 2009-05-02 ENCOUNTER — Ambulatory Visit: Payer: Self-pay | Admitting: Internal Medicine

## 2009-05-02 DIAGNOSIS — L309 Dermatitis, unspecified: Secondary | ICD-10-CM

## 2009-05-02 HISTORY — DX: Dermatitis, unspecified: L30.9

## 2009-06-02 ENCOUNTER — Observation Stay (HOSPITAL_COMMUNITY): Admission: EM | Admit: 2009-06-02 | Discharge: 2009-06-02 | Payer: Self-pay | Admitting: Emergency Medicine

## 2009-06-20 ENCOUNTER — Telehealth: Payer: Self-pay | Admitting: Endocrinology

## 2009-07-21 ENCOUNTER — Telehealth: Payer: Self-pay | Admitting: Endocrinology

## 2009-08-21 ENCOUNTER — Telehealth (INDEPENDENT_AMBULATORY_CARE_PROVIDER_SITE_OTHER): Payer: Self-pay | Admitting: *Deleted

## 2009-08-22 ENCOUNTER — Encounter: Payer: Self-pay | Admitting: Endocrinology

## 2009-11-16 ENCOUNTER — Telehealth (INDEPENDENT_AMBULATORY_CARE_PROVIDER_SITE_OTHER): Payer: Self-pay | Admitting: *Deleted

## 2009-12-19 ENCOUNTER — Telehealth: Payer: Self-pay | Admitting: Endocrinology

## 2010-01-09 ENCOUNTER — Telehealth: Payer: Self-pay | Admitting: Endocrinology

## 2010-02-01 ENCOUNTER — Telehealth: Payer: Self-pay | Admitting: Endocrinology

## 2010-03-14 ENCOUNTER — Telehealth: Payer: Self-pay | Admitting: Endocrinology

## 2010-03-20 ENCOUNTER — Ambulatory Visit: Payer: Self-pay | Admitting: Endocrinology

## 2010-03-20 DIAGNOSIS — I10 Essential (primary) hypertension: Secondary | ICD-10-CM

## 2010-03-20 DIAGNOSIS — R21 Rash and other nonspecific skin eruption: Secondary | ICD-10-CM | POA: Insufficient documentation

## 2010-03-20 DIAGNOSIS — E7800 Pure hypercholesterolemia, unspecified: Secondary | ICD-10-CM

## 2010-03-20 HISTORY — DX: Essential (primary) hypertension: I10

## 2010-03-20 HISTORY — DX: Pure hypercholesterolemia, unspecified: E78.00

## 2010-03-28 ENCOUNTER — Telehealth: Payer: Self-pay | Admitting: Endocrinology

## 2010-03-28 ENCOUNTER — Ambulatory Visit: Payer: Self-pay | Admitting: Endocrinology

## 2010-03-28 LAB — CONVERTED CEMR LAB
AST: 20 units/L (ref 0–37)
BUN: 12 mg/dL (ref 6–23)
Basophils Absolute: 0.1 10*3/uL (ref 0.0–0.1)
Bilirubin, Direct: 0.2 mg/dL (ref 0.0–0.3)
Calcium: 9.7 mg/dL (ref 8.4–10.5)
Direct LDL: 85.9 mg/dL
Glucose, Bld: 146 mg/dL — ABNORMAL HIGH (ref 70–99)
HCT: 40.7 % (ref 36.0–46.0)
HDL: 38.7 mg/dL — ABNORMAL LOW (ref >39.00)
Hemoglobin, Urine: NEGATIVE
Hemoglobin: 13.8 g/dL (ref 12.0–15.0)
Hgb A1c MFr Bld: 12.9 % — ABNORMAL HIGH (ref 4.6–6.5)
Lymphocytes Relative: 23.2 % (ref 12.0–46.0)
MCV: 79.7 fL (ref 78.0–100.0)
Monocytes Absolute: 0.6 10*3/uL (ref 0.1–1.0)
Monocytes Relative: 7.7 % (ref 3.0–12.0)
Neutro Abs: 5.4 10*3/uL (ref 1.4–7.7)
Nitrite: NEGATIVE
Platelets: 243 10*3/uL (ref 150.0–400.0)
Potassium: 3.7 meq/L (ref 3.5–5.1)
RBC: 5.11 M/uL (ref 3.87–5.11)
RDW: 16.1 % — ABNORMAL HIGH (ref 11.5–14.6)
Specific Gravity, Urine: 1.02 (ref 1.000–1.030)
TSH: 1.92 microintl units/mL (ref 0.35–5.50)
Total CHOL/HDL Ratio: 4
Total Protein, Urine: NEGATIVE mg/dL
Triglycerides: 246 mg/dL — ABNORMAL HIGH (ref 0.0–149.0)
VLDL: 49.2 mg/dL — ABNORMAL HIGH (ref 0.0–40.0)
WBC: 8.3 10*3/uL (ref 4.5–10.5)
pH: 7.5 (ref 5.0–8.0)

## 2010-04-16 ENCOUNTER — Telehealth: Payer: Self-pay | Admitting: Endocrinology

## 2010-05-24 LAB — HM DIABETES EYE EXAM: HM Diabetic Eye Exam: NORMAL

## 2010-05-28 ENCOUNTER — Ambulatory Visit: Payer: Self-pay | Admitting: Family Medicine

## 2010-05-28 ENCOUNTER — Encounter: Payer: Self-pay | Admitting: Endocrinology

## 2010-05-28 DIAGNOSIS — I872 Venous insufficiency (chronic) (peripheral): Secondary | ICD-10-CM | POA: Insufficient documentation

## 2010-05-28 DIAGNOSIS — L408 Other psoriasis: Secondary | ICD-10-CM | POA: Insufficient documentation

## 2010-05-28 HISTORY — DX: Other psoriasis: L40.8

## 2010-05-28 HISTORY — DX: Venous insufficiency (chronic) (peripheral): I87.2

## 2010-05-28 LAB — CONVERTED CEMR LAB
ALT: 18 units/L (ref 0–35)
Calcium: 9.1 mg/dL (ref 8.4–10.5)
Chloride: 101 meq/L (ref 96–112)
Cholesterol: 169 mg/dL (ref 0–200)
Creatinine, Ser: 0.77 mg/dL (ref 0.40–1.20)
Eosinophils Absolute: 0.2 10*3/uL (ref 0.0–0.7)
Glucose, Bld: 291 mg/dL — ABNORMAL HIGH (ref 70–99)
HCT: 43.1 % (ref 36.0–46.0)
HDL: 43 mg/dL (ref >39)
Hemoglobin: 14.1 g/dL (ref 12.0–15.0)
LDL Cholesterol: 94 mg/dL (ref 0–99)
Lymphs Abs: 1.9 10*3/uL (ref 0.7–4.0)
MCHC: 32.7 g/dL (ref 30.0–36.0)
Neutrophils Relative %: 64 % (ref 43–77)
Platelets: 265 10*3/uL (ref 150–400)
Potassium: 3.8 meq/L (ref 3.5–5.3)
Sodium: 138 meq/L (ref 135–145)
Total Bilirubin: 0.3 mg/dL (ref 0.3–1.2)
Total CHOL/HDL Ratio: 3.9
Total Protein: 7 g/dL (ref 6.0–8.3)
VLDL: 32 mg/dL (ref 0–40)
Vitamin B-12: 300 pg/mL (ref 211–911)

## 2010-05-28 LAB — HM DIABETES FOOT EXAM

## 2010-06-08 ENCOUNTER — Telehealth: Payer: Self-pay | Admitting: Endocrinology

## 2010-06-11 ENCOUNTER — Ambulatory Visit: Payer: Self-pay | Admitting: Endocrinology

## 2010-06-20 ENCOUNTER — Ambulatory Visit: Payer: Self-pay | Admitting: Family Medicine

## 2010-06-26 ENCOUNTER — Telehealth: Payer: Self-pay | Admitting: Endocrinology

## 2010-07-12 ENCOUNTER — Ambulatory Visit: Admission: RE | Admit: 2010-07-12 | Discharge: 2010-07-12 | Payer: Self-pay | Source: Home / Self Care

## 2010-07-12 LAB — CONVERTED CEMR LAB
Albumin/Creatinine Ratio, Urine, POC: ABNORMAL
Creatinine,U: 50 mg/dL
Microalbumin U total vol: 80 mg/L

## 2010-07-16 ENCOUNTER — Encounter: Payer: Self-pay | Admitting: Orthopedic Surgery

## 2010-07-16 ENCOUNTER — Telehealth: Payer: Self-pay | Admitting: Endocrinology

## 2010-07-17 ENCOUNTER — Telehealth (INDEPENDENT_AMBULATORY_CARE_PROVIDER_SITE_OTHER): Payer: Self-pay | Admitting: *Deleted

## 2010-07-19 ENCOUNTER — Encounter: Payer: Self-pay | Admitting: Endocrinology

## 2010-07-26 NOTE — Assessment & Plan Note (Signed)
Summary: f/u/rh   Vital Signs:  Patient profile:   55 year old female Weight:      297 pounds Temp:     97.9 degrees F oral Pulse rate:   91 / minute Pulse rhythm:   regular BP sitting:   146 / 81  (left arm) Cuff size:   large  Vitals Entered By: Audelia Hives CMA (June 20, 2010 10:08 AM)  Primary Provider:  Loanne Drilling   History of Present Illness: pt is here for f/u DM.  she is continuing to see Dr. Loanne Drilling most recently 9 days ago, wherein her insulin was adjusted to 250 in the am and 100 in the pm.  however, pt continues to do self titration out of fear of hypoglycemia fluctuating with 100-250 units in the am.  pt again forgot her log book, but per her report her CBGs in the am are 275-389.  however she randomly checks her blood sugars getting an occasional reading of 100s prior to bedtime.    she continues to have lower extremity pain and numbness.  she was unable to fill the lyrica due to cost. symptoms are otherwise stable.   Allergies: 1)  ! Keflex 2)  ! Neosporin 3)  ! Adhesive Tape  Physical Exam  General:  Well-developed,well-nourished,in no acute distress; alert,appropriate and cooperative throughout examination Lungs:  Normal respiratory effort, chest expands symmetrically. Lungs are clear to auscultation, no crackles or wheezes. Heart:  Normal rate and regular rhythm. S1 and S2 normal without gallop, murmur, click, rub or other extra sounds. Extremities:  trace 1+ pitting edema bilaterally venous stasis changes of the right lower extremity, unchanged from previous. Skin:  + psoriasis on the dorsum of left foot   Impression & Recommendations:  Problem # 1:  PERIPHERAL NEUROPATHY (ICD-356.9) Assessment Unchanged  added neurontin 300 three times a day instead of lyrica.  pt advised this may cause some drowsiness and adjust dosage as tolerated to three times a day dosing.   Orders: [place]- Est Level  3 ([address])  Problem # 2:  DIABETES MELLITUS, WITH NEUROLOGICAL  COMPLICATIONS (ACZ-660.63) Assessment: Unchanged  Her updated medication list for this problem includes:    Adult Aspirin Low Strength 81 Mg Tbdp (Aspirin) .Marland Kitchen... Take 1 by mouth qd    Novolin 70/30 70-30 % Susp (Insulin isophane & regular) .Marland Kitchen... 250 units each am and 100 each pm    Accuretic 20-12.5 Mg Tabs (Quinapril-hydrochlorothiazide) .Marland Kitchen... 2 tablets by mouth daily    Metformin Hcl 500 Mg Tabs (Metformin hcl) ..... One tab by mouth two times a day for dm  encouraged pt compliance with the current regiment.  since her insulin was just recently changed, i did not change her doses today.  advised pt to check her sugars 5 times daily, qam/qhs and 2 hrs postprandial.  pt expressed understanding in that regard.  additionally pt advised to either bring glucometer or sugar log to her next appointment.  results discussed with the pt.   in regards to Dr. Loanne Drilling, she states she is trying to change over due to the high co pays. will continue to follow along.   Orders: [place]- Est Level  3 ([address])  Complete Medication List: 1)  Adult Aspirin Low Strength 81 Mg Tbdp (Aspirin) .... Take 1 by mouth qd 2)  Cyanocobalamin 1000 Mcg/ml Soln (Cyanocobalamin) .... Administer 1 ml intramuscularly once a month 3)  Vicodin 5-500 Mg Tabs (Hydrocodone-acetaminophen) .... Q4h as needed pain 4)  Insulin Syringe 29g X 1/2" 1  Ml Misc (Insulin syringe-needle u-100) .... Use two times a day 5)  Novolin 70/30 70-30 % Susp (Insulin isophane & regular) .... 250 units each am and 100 each pm 6)  Onetouch Ultra Test Strp (Glucose blood) .... Use two times a day, and lancets 7)  Furosemide 20 Mg Tabs (Furosemide) .... Take 1 by mouth qd 8)  Venlafaxine Hcl 225 Mg Xr24h-tab (Venlafaxine hcl) .Marland Kitchen.. 1 once daily 9)  Fluconazole 200 Mg Tabs (Fluconazole) .... Once daily for 5 days 10)  Lipitor 40 Mg Tabs (Atorvastatin calcium) .... Take 2 tablets by mouth at bedtime 11)  Accuretic 20-12.5 Mg Tabs (Quinapril-hydrochlorothiazide)  .... 2 tablets by mouth daily 12)  Triamcinolone Acetonide 0.5 % Crea (Triamcinolone acetonide) .... Three times a day as needed for itching 13)  Metformin Hcl 500 Mg Tabs (Metformin hcl) .... One tab by mouth two times a day for dm 14)  Neurontin 300 Mg Caps (Gabapentin) .... One tab by mouth three times a day for neuropathy  Patient Instructions: 1)  it was a pleasure caring for you today.  2)  please make an appointment for 3-4 weeks f/u DM 3)  PLEASE CHECK YOUR BLOOD SUGAR EVERY MORNING AND EVENING AS WELL AS 2 HOURS AFTER EACH MEAL! 4)  take medication as it is prescribed.  Prescriptions: METFORMIN HCL 500 MG TABS (METFORMIN HCL) one tab by mouth two times a day for DM  #180 x 0   Entered and Authorized by:   Willaim Bane MD   Signed by:   Willaim Bane MD on 06/20/2010   Method used:   Print then Give to Patient   RxID:   [id number] NEURONTIN 300 MG CAPS (GABAPENTIN) one tab by mouth three times a day for neuropathy  #90 x 0   Entered and Authorized by:   Willaim Bane MD   Signed by:   Willaim Bane MD on 06/20/2010   Method used:   Print then Give to Patient   RxID:   [id number]    Orders Added: 1)  [facility]- Est Level  3 [[address]]

## 2010-07-26 NOTE — Progress Notes (Signed)
Summary: Rx refill req  Phone Note Refill Request Message from:  Patient on March 14, 2010 8:15 AM  Refills Requested: Medication #1:  VENLAFAXINE HCL 225 MG XR24H-TAB qd   Dosage confirmed as above?Dosage Confirmed   Supply Requested: 3 months Pt advised that there willbe on additional refill until OV.   Method Requested: Fax to [facility] Initial call taken by: Crissie Sickles, CMA,  March 14, 2010 8:16 AM    Prescriptions: VENLAFAXINE HCL 225 MG XR24H-TAB (VENLAFAXINE HCL) qd  #30 x 1   Entered by:   Crissie Sickles, CMA   Authorized by:   Donavan Foil MD   Signed by:   Crissie Sickles, CMA on 03/14/2010   Method used:   Faxed to ...       [facility] (retail)       [address] [place], [place]  [address]       Ph: [phone]       Fax: [phone]   RxID:   [id number]

## 2010-07-26 NOTE — Assessment & Plan Note (Signed)
Summary: ?ulcer foot, it has been over a year/cd   Vital Signs:  Patient profile:   55 year old female Height:      67 inches (170.18 cm) Weight:      298 pounds (135.45 kg) BMI:     46.84 O2 Sat:      95 % on Room air Temp:     98.5 degrees F (36.94 degrees C) oral Pulse rate:   84 / minute BP sitting:   128 / 72  (left arm) Cuff size:   large  Vitals Entered By: Rebeca Alert MA (March 20, 2010 3:20 PM)  O2 Flow:  Room air CC: Ulcers on left foot, leg/foot pain/pt is no longer taking Lisinopril-HCTZ, Zocor, Vicodin, Furosemide, Fluconazole or Lidex/aj Is Patient Diabetic? Yes   Primary Elliotte Marsalis:  Loanne Drilling  CC:  Ulcers on left foot, leg/foot pain/pt is no longer taking Lisinopril-HCTZ, Zocor, Vicodin, Furosemide, and Fluconazole or Lidex/aj.  History of Present Illness: pt states few mos of moderate rash at the left foot, and assoc itching.   she takes lipitor as rx'ed.   no cbg redcord, but pt states cbg's are high.  Current Medications (verified): 1)  Lisinopril-Hydrochlorothiazide 20-12.5 Mg  Tabs (Lisinopril-Hydrochlorothiazide) .... Take 2 By Mouth Qd 2)  Adult Aspirin Low Strength 81 Mg  Tbdp (Aspirin) .... Take 1 By Mouth Qd 3)  Vitamin B-12 Cr 1000 Mcg  Tbcr (Cyanocobalamin) .... Take 1 Injection Q Month 4)  Cyanocobalamin 1000 Mcg/ml Soln (Cyanocobalamin) .... Administer 1 Ml Intramuscularly Once A Month 5)  Zocor 80 Mg Tabs (Simvastatin) .... Take 1 Tablet By Mouth At Bedtime 6)  Vicodin 5-500 Mg  Tabs (Hydrocodone-Acetaminophen) .... Q4h As Needed Pain 7)  Insulin Syringe 29g X 1/2" 1 Ml  Misc (Insulin Syringe-Needle U-100) .... Use Two Times A Day 8)  Novolin 70/30 70-30 %  Susp (Insulin Isophane & Regular) .Marland KitchenMarland KitchenMarland Kitchen 175 Units Qam and 75 Qpm 9)  Onetouch Ultra Test  Strp (Glucose Blood) .... Use Two Times A Day 10)  Furosemide 20 Mg Tabs (Furosemide) .... Take 1 By Mouth Qd 11)  Venlafaxine Hcl 225 Mg Xr24h-Tab (Venlafaxine Hcl) .... Qd 12)  Fluconazole 200 Mg  Tabs (Fluconazole) .... Once Daily For 5 Days 13)  Lidex 0.05 % Crea (Fluocinonide) .... Three Times A Day As Needed Rash 14)  Lipitor 40 Mg Tabs (Atorvastatin Calcium) .... Take 2 Tablets By Mouth At Bedtime 15)  Accuretic 20-12.5 Mg Tabs (Quinapril-Hydrochlorothiazide) .... 2 Tablets By Mouth Daily  Allergies (verified): 1)  ! Keflex 2)  ! Neosporin 3)  ! Adhesive Tape  Past History:  Past Medical History: Last updated: 05/13/2007 DYSLIPIDEMIA (ICD-272.4) GOITER, MULTINODULAR (ICD-241.1) ANEMIA, PERNICIOUS, HX OF (ICD-V12.3) PAIN IN SOFT TISSUES OF LIMB (ICD-729.5) DIABETES MELLITUS, TYPE I (ICD-250.01) NASH OA Knees DM Peripheral Sensory Neuropathy  Review of Systems  The patient denies fever.         she has 2 years of moderate numbness and pain at the feet, somewhat less at night.    Physical Exam  General:  morbidly obese.  no distress  Pulses:  dorsalis pedis intact bilat.   Extremities:  no deformity.  no ulcer on the feet.  feet are of normal temp.   there is slight rust-colored hyperpigmentation of the legs. 1+ right pedal edema and 1+ left pedal edema.   on the dorsal aspect of the left foot, there are 2 shiny plaques, each 4 cm diameter. Neurologic:  sensation is intact to touch on  the feet, but decreased from normal.   Impression & Recommendations:  Problem # 1:  SKIN RASH (WPV-948.0) uncertain etiology  Problem # 2:  HYPERCHOLESTEROLEMIA (ICD-272.0)  Problem # 3:  DIABETES MELLITUS, WITH NEUROLOGICAL COMPLICATIONS (XKP-537.48) needs increased rx  Medications Added to Medication List This Visit: 1)  Novolin 70/30 70-30 % Susp (Insulin isophane & regular) .... 250 units each am and 100 each pm 2)  Onetouch Ultra Test Strp (Glucose blood) .... Use two times a day, and lancets 3)  Venlafaxine Hcl 225 Mg Xr24h-tab (Venlafaxine hcl) .Marland Kitchen.. 1 once daily 4)  Triamcinolone Acetonide 0.5 % Crea (Triamcinolone acetonide) .... Three times a day as needed for  itching  Other Orders: TLB-Lipid Panel (80061-LIPID) TLB-BMP (Basic Metabolic Panel-BMET) (27078-MLJQGBE) TLB-CBC Platelet - w/Differential (85025-CBCD) TLB-Hepatic/Liver Function Pnl (80076-HEPATIC) TLB-TSH (Thyroid Stimulating Hormone) (84443-TSH) TLB-A1C / Hgb A1C (Glycohemoglobin) (83036-A1C) TLB-Microalbumin/Creat Ratio, Urine (82043-MALB) TLB-Udip w/ Micro (81001-URINE) Est. Patient Level III (01007)  Patient Instructions: 1)  in my opinion, you are completely and permanently disabled.  2)  let me know if you decide to see a dermatologist.  in the meantime, take triamcinolone cream three times a day as needed for itching.  3)  check your blood sugar 1 time a day.  vary the time of day when you check, between before the 3 meals, and at bedtime.  also check if you have symptoms of your blood sugar being too high or too low.  please keep a record of the readings and bring it to your next appointment here.  please call us sooner if you are having low blood sugar episodes. 4)  Please schedule a follow-up appointment in 1 month. 5)  increase insulin to 250 units with breakfast, and 100 units with the evening meal. 6)  (update:  pt was gived lab forms, but no results.  pt will be called to see if she did labs). Prescriptions: ONETOUCH ULTRA TEST  STRP (GLUCOSE BLOOD) use two times a day, and lancets  #200 x 0   Entered and Authorized by:   Donavan Foil MD   Signed by:   Donavan Foil MD on 03/20/2010   Method used:   Faxed to ...       [facility] (retail)       [place], [place]  [address]       Ph: [phone]       Fax: [phone]   RxID:   [id number] LIPITOR 40 MG TABS (ATORVASTATIN CALCIUM) Take 2 tablets by mouth at bedtime  #180 x 0   Entered and Authorized by:   Donavan Foil MD   Signed by:   Donavan Foil MD on 03/20/2010   Method used:   Faxed to ...       [facility] (retail)       [place], [place]  [address]       Ph: [phone]       Fax: [phone]   RxID:   [id number] VENLAFAXINE HCL 225 MG XR24H-TAB (VENLAFAXINE HCL) 1 once daily  #90 x 0   Entered and Authorized by:   Donavan Foil MD   Signed by:   Donavan Foil MD on 03/20/2010   Method used:   Faxed to ...       [facility] (retail)  [place], [place]  [address]       Ph: [phone]       Fax: [phone]   RxID:   [id number] FUROSEMIDE 20 MG TABS (FUROSEMIDE) take 1 by mouth qd  #90 x 0   Entered and Authorized by:   Donavan Foil MD   Signed by:   Donavan Foil MD on 03/20/2010   Method used:   Faxed to ...       [facility] (retail)       [place], [place]  [address]       Ph: [phone]       Fax: [phone]   RxID:   [id number] NOVOLIN 70/30 70-30 %  SUSP (INSULIN ISOPHANE & REGULAR) 250 units each am and 100 each pm  #35 vials x 0   Entered and Authorized by:   Donavan Foil MD   Signed by:   Donavan Foil MD on 03/20/2010   Method used:   Faxed to ...       [facility] (retail)       [place], [place]  [address]       Ph: [phone]       Fax: [phone]   RxID:   [id number] INSULIN SYRINGE 29G X 1/2" 1 ML  MISC (INSULIN SYRINGE-NEEDLE U-100) use two times a day  #180 x 0   Entered and Authorized by:   Donavan Foil MD   Signed by:   Donavan Foil MD on 03/20/2010   Method used:   Faxed to ...       [facility] (retail)       [place], [place]  [address]       Ph: [phone]       Fax: [phone]   RxID:   [id number] CYANOCOBALAMIN 1000 MCG/ML SOLN (CYANOCOBALAMIN) Administer 1 ml intramuscularly once a month  #10 cc x 0   Entered and Authorized by:   Donavan Foil MD   Signed by:   Donavan Foil MD on 03/20/2010   Method used:   Faxed to ...        [facility] (retail)       [place], [place]  [address]       Ph: [phone]       Fax: [phone]   RxID:   [id number] ACCURETIC 20-12.5 MG TABS (QUINAPRIL-HYDROCHLOROTHIAZIDE) 2 tablets by mouth daily  #180 x 0   Entered and Authorized by:   Donavan Foil MD   Signed by:   Donavan Foil MD on 03/20/2010   Method used:   Faxed to ...       [facility] (retail)       [place], [place]  [address]       Ph: [phone]       Fax: [phone]   RxID:   [id number] ACCURETIC 20-12.5 MG TABS (QUINAPRIL-HYDROCHLOROTHIAZIDE) 2 tablets by mouth daily  #180 x 0   Entered and Authorized by:   Donavan Foil MD   Signed by:   Donavan Foil MD on 03/20/2010   Method used:  Print then Give to Patient   RxID:   [id number] TRIAMCINOLONE ACETONIDE 0.5 % CREA (TRIAMCINOLONE ACETONIDE) three times a day as needed for itching  #1 med tube x 2   Entered and Authorized by:   Donavan Foil MD   Signed by:   Donavan Foil MD on 03/20/2010   Method used:   Electronically to        [facility] [phone]* (retail)       [address]       [place], [place]  [address]       Ph: [phone]       Fax: [phone]   RxID:   [id number]

## 2010-07-26 NOTE — Progress Notes (Signed)
Summary: med request  Phone Note From Pharmacy   Caller: [facility] Dept. Summary of Call: Received refill request from [facility] for pt for Diflucan 232m tablets 1 tablet by mouth for 5 days-Please advise Initial call taken by: ARebeca AlertCMA (Deborra Medina,  June 08, 2010 2:47 PM  Follow-up for Phone Call        please refill x 1 ov overdue Follow-up by: SDonavan FoilMD,  June 08, 2010 2:52 PM  Additional Follow-up for Phone Call Additional follow up Details #1::        [facility] 06/11/2010 9:45am Additional Follow-up by: ARebeca AlertCMA (Deborra Medina,  June 08, 2010 3:24 PM    New/Updated Medications: DIFLUCAN 200 MG TABS (FLUCONAZOLE) take 1 tablet by mouth once daily for 5 days Prescriptions: DIFLUCAN 200 MG TABS (FLUCONAZOLE) take 1 tablet by mouth once daily for 5 days  #5 x 0   Entered by:   ARebeca AlertCMA (AOlivia Lopez de Gutierrez   Authorized by:   SDonavan FoilMD   Signed by:   ARebeca AlertCMA (ASocial Circle on 06/08/2010   Method used:   [facility]       [address] [place]  [address]      Ph: [phone]      Fax: [phone]  RxID:   [id number]

## 2010-07-26 NOTE — Progress Notes (Signed)
Summary: simvastatin/lisinopril rx  Phone Note From Pharmacy   Caller: [facility] Department Summary of Call: refill request sent in from pharmacy for Simvastatin to be changed to either Lipitor 77m or Crestor 147mand for Lisinopril-HCTZ to be changed to Accuretic 20/12.81m89mPharmacy states pt would receive RX's free and/or at a reduced cost if changed.--pt was scheduled for OV to discuss changes with Dr. EllLoanne Drillingis month but canceled both appts-please advise Initial call taken by: AshRebeca Alert,  December 19, 2009 4:15 PM  Follow-up for Phone Call        ok - change simvastatin to lipitor 40 and may change lisinopril HCT to accuretic as requested - thanks  Follow-up by: ValRowe Clack,  December 19, 2009 4:56 PM    New/Updated Medications: LIPITOR 40 MG TABS (ATORVASTATIN CALCIUM) Take 2 tablets by mouth at bedtime ACCURETIC 20-12.5 MG TABS (QUINAPRIL-HYDROCHLOROTHIAZIDE) 2 tablets by mouth daily Prescriptions: ACCURETIC 20-12.5 MG TABS (QUINAPRIL-HYDROCHLOROTHIAZIDE) 2 tablets by mouth daily  #60 x 5   Entered by:   AshRebeca Alert   Authorized by:   SeaDonavan Foil   Signed by:   AshRebeca Alert on 12/20/2009   Method used:   [facility] ...       [facility]       [address]  [address]    Ph: [phone]    Fax: [phone]:   [id number] 40 MG TABS (ATORVASTATIN CALCIUM) Take 2 tablets by mouth at bedtime  #60 x 5   Entered by:   AshRebeca Alert   Authorized by:   SeaDonavan Foil   Signed by:   AshRebeca Alert on 12/20/2009   Method used:   [facility]       [facility]       [address]  [address]    Ph: [phone]    Fax: [phone]:   [id number]

## 2010-07-26 NOTE — Progress Notes (Signed)
Summary: rx refill req  Phone Note Refill Request Message from:  Fax from Pharmacy on April 16, 2010 4:33 PM  Refills Requested: Medication #1:  FLUCONAZOLE 200 MG TABS once daily for 5 days   Dosage confirmed as above?Dosage Confirmed   Last Refilled: 01/08/2010 please advise-[facility].   Method Requested: Fax to [facility] Initial call taken by: Rebeca Alert MA,  April 16, 2010 4:34 PM  Follow-up for Phone Call        please refill x 1 Follow-up by: Donavan Foil MD,  April 16, 2010 4:37 PM    Prescriptions: FLUCONAZOLE 200 MG TABS (FLUCONAZOLE) once daily for 5 days  #5 x 0   Entered by:   Rebeca Alert MA   Authorized by:   Donavan Foil MD   Signed by:   Rebeca Alert MA on 04/16/2010   Method used:   Faxed to ...       [facility] (retail)       [address] [place], [place]  [address]       Ph: [phone]       Fax: [phone]   RxID:   [id number]

## 2010-07-26 NOTE — Progress Notes (Signed)
  Phone Note From Pharmacy   Caller: [facility] Dept. Summary of Call: Pharmacy would like to know if MD would like to change Lisinopril to Accuretic 20/12.63m and Simvastin to Lipitor 466mor Crestor 1034mPharmacy states pt could get RX's free if changed. Please advise? Initial call taken by: ChrGardenia PhlegmA,  Nov 16, 2009 9:05 AM  Follow-up for Phone Call        ov would be necessary to evaluate these medical conditions. Follow-up by: SeaDonavan Foil,  Nov 16, 2009 12:16 PM  Additional Follow-up for Phone Call Additional follow up Details #1::        Left message for pt to return my call. Additional Follow-up by: ChrGardenia PhlegmA,  Nov 16, 2009 12:56 PM    Additional Follow-up for Phone Call Additional follow up Details #2::    Pts husband scheduling appt for pt Follow-up by: ChrGardenia PhlegmA,  Nov 17, 2009 8:51 AM

## 2010-07-26 NOTE — Progress Notes (Signed)
Summary: Paperwork Estate manager/land agent)  Phone Note Outgoing Call   Summary of Call: Left message for [place] to return my call. MD would like to see if they will just send paperwork to medical records so MD doesn't have to fill out paperwork and charge her since pt doesn't have insurance. Initial call taken by: Gardenia Phlegm RMA,  August 21, 2009 9:30 AM  Follow-up for Phone Call        Paperwork completed. Follow-up by: Ernestene Mention,  August 22, 2009 11:44 AM

## 2010-07-26 NOTE — Assessment & Plan Note (Signed)
Summary: np,df   Vital Signs:  Patient profile:   55 year old female Height:      67 inches Weight:      299 pounds BMI:     47.00 Temp:     98.2 degrees F oral Pulse rate:   73 / minute BP sitting:   147 / 79  (right arm) Cuff size:   large  Vitals Entered By: Schuyler Amor CMA (May 28, 2010 1:38 PM) CC: NEW PT Is Patient Diabetic? Yes Pain Assessment Patient in pain? yes     Location: bilateral foot Intensity: 5   Primary Joslynn Jamroz:  Loanne Drilling  CC:  NEW PT.  History of Present Illness: pt is here to establish/transfer her medical care.  1. DM II- pt is currently on NPH 70/30 150Units in the AM and 100 Units in the pm without any meal insulin.  per her report her HgA1C is 12-13.  she states being compliant however, she was instructed by her previous physician to increase the am to 250 units.  but pt was hesitant to do so, thus she will do anywhere from 100-200 units in the am depending upon her FBG.  typically her fasting sugars are 200-400s.  with that she also has peripheral neuropathy with numbness and tingling in bilateral lower extremities for which she is currently not on any medication currently.  she did acheive some improved glycemic control with nutrasystem which was concomitant with weight loss however, due to finances she was unable to continue the program resulting in regaining of some of her weight.   2. Peripheral neuropathy-bilateral lower extremity pain, not currently on any medication.  pt is s/p EMG, however, no discernable etiology obtained.  pt does have a history of B12 deficiency by which she gives herself IM injections.   Allergies: 1)  ! Keflex 2)  ! Neosporin 3)  ! Adhesive Tape  Past History:  Past medical, surgical, family and social histories (including risk factors) reviewed, and no changes noted (except as noted below).  Past Medical History: DYSLIPIDEMIA (ICD-272.4) GOITER, MULTINODULAR (ICD-241.1) ANEMIA, PERNICIOUS, HX OF  (ICD-V12.3) PAIN IN SOFT TISSUES OF LIMB (ICD-729.5) DIABETES MELLITUS, TYPE I (ICD-250.01) NASH OA Knees DM Peripheral Sensory Neuropathy Depression OSA  Past Surgical History: Reviewed history from 10/06/2007 and no changes required. Exercise Myoview (01/24/2005) EKG (04/16/2006) arthroscopy right knee - '03 vein stripping righ leg- remotef Tonsillectomy  Family History: Reviewed history from 10/06/2007 and no changes required. father-deceased @ 69: pneumonia, HTN, Lipid, cirrhosis, colon cancer mother - deceased @50 : cerebral aneurysm. Neg- breast cancer, DM, CAD Sister with colon cancer  Social History: Reviewed history from 02/18/2008 and no changes required. HSG married '91  [id number] x2, 1son '91 8.5 lbs, 1 daughter '92 10lbs unemployed  Review of Systems       denies all not mentioned in the HPI  Physical Exam  General:  Well-developed,well-nourished,in no acute distress; alert,appropriate and cooperative throughout examination Eyes:  No corneal or conjunctival inflammation noted. EOMI. Perrla. Funduscopic exam benign, without hemorrhages, exudates or papilledema. Vision grossly normal. Ears:  External ear exam shows no significant lesions or deformities.  Otoscopic examination reveals clear canals, tympanic membranes are intact bilaterally without bulging, retraction, inflammation or discharge. Hearing is grossly normal bilaterally. Nose:  External nasal examination shows no deformity or inflammation. Nasal mucosa are pink and moist without lesions or exudates. Mouth:  Oral mucosa and oropharynx without lesions or exudates.  Teeth in good repair. Neck:  No deformities, masses, or  tenderness noted. Lungs:  Normal respiratory effort, chest expands symmetrically. Lungs are clear to auscultation, no crackles or wheezes. Heart:  Normal rate and regular rhythm. S1 and S2 normal without gallop, murmur, click, rub or other extra sounds. Abdomen:  Bowel sounds  positive,abdomen soft and non-tender without masses, organomegaly or hernias noted., obese Neurologic:  Poor sensation to light touch bilateral lower extremities. Skin:  superficial scaly patches on dorsum of left foot, venous stasis changes on RLE with mild varicosities present.   Diabetes Management Exam:    Foot Exam (with socks and/or shoes not present):       Sensory-Pinprick/Light touch:          Left medial foot (L-4): diminished          Left dorsal foot (L-5): diminished          Left lateral foot (S-1): diminished          Right medial foot (L-4): diminished          Right dorsal foot (L-5): diminished          Right lateral foot (S-1): diminished       Inspection:          Left foot: abnormal             Comments: callous, multiple areas of scaly erythematous patches on the dorsum of the foot    Eye Exam:       Eye Exam done elsewhere          Date: 05/24/2010          Results: normal          Done by: OSH   Impression & Recommendations:  Problem # 1:  DIABETES MELLITUS, WITH NEUROLOGICAL COMPLICATIONS (CRF-543.60) Assessment Deteriorated  Her updated medication list for this problem includes:    Adult Aspirin Low Strength 81 Mg Tbdp (Aspirin) .Marland Kitchen... Take 1 by mouth qd    Novolin 70/30 70-30 % Susp (Insulin isophane & regular) .Marland Kitchen... 250 units each am and 100 each pm    Accuretic 20-12.5 Mg Tabs (Quinapril-hydrochlorothiazide) .Marland Kitchen... 2 tablets by mouth daily    Metformin Hcl 500 Mg Tabs (Metformin hcl) ..... One tab by mouth two times a day for dm  Orders: Comp Met-[facility] (67703-40352) Lipid-[facility] (48185-90931) A1C-[facility] (12162) UA Microalbumin-[facility] (44695)  added metformin.  may need to adjust based on creatinine.  currently pending. will follow up results.   Problem # 2:  HYPERCHOLESTEROLEMIA (ICD-272.0) Assessment: Unchanged  Her updated medication list for this problem includes:    Lipitor 40 Mg Tabs (Atorvastatin calcium) .Marland Kitchen... Take 2 tablets by mouth at  bedtime  Orders: Comp Met-[facility] [id number]) Lipid-[facility] (83358-25189)  continue current regiment  Problem # 3:  PERIPHERAL NEUROPATHY (ICD-356.9) Assessment: Unchanged  Orders: TSH-[facility] (84210-31281) B12-[facility] (18867-73736)  Peripheral neuropathy likely due to uncontrolled diabetes.  will start lyrica slowly esp with concomitant effexor.  will monitor closely.  awaiting labs   Problem # 4:  PSORIASIS (ICD-696.1) Assessment: New continue lidex cream as needed  will montor closely.   Problem # 5:  ROUTINE GENERAL MEDICAL EXAM@HEALTH  CARE FACL (ICD-V70.0) Assessment: Unchanged  Orders: Mammogram (Mammogram) Colonoscopy (Colon)  +Fhx colon cancer in the 57s.  will check colonoscopy.   will check mammogram for well woman component.  Complete Medication List: 1)  Adult Aspirin Low Strength 81 Mg Tbdp (Aspirin) .... Take 1 by mouth qd 2)  Cyanocobalamin 1000 Mcg/ml Soln (Cyanocobalamin) .... Administer 1 ml intramuscularly once a month 3)  Vicodin 5-500 Mg Tabs (Hydrocodone-acetaminophen) .... Q4h as needed pain 4)  Insulin Syringe 29g X 1/2" 1 Ml Misc (Insulin syringe-needle u-100) .... Use two times a day 5)  Novolin 70/30 70-30 % Susp (Insulin isophane & regular) .... 250 units each am and 100 each pm 6)  Onetouch Ultra Test Strp (Glucose blood) .... Use two times a day, and lancets 7)  Furosemide 20 Mg Tabs (Furosemide) .... Take 1 by mouth qd 8)  Venlafaxine Hcl 225 Mg Xr24h-tab (Venlafaxine hcl) .Marland Kitchen.. 1 once daily 9)  Fluconazole 200 Mg Tabs (Fluconazole) .... Once daily for 5 days 10)  Lipitor 40 Mg Tabs (Atorvastatin calcium) .... Take 2 tablets by mouth at bedtime 11)  Accuretic 20-12.5 Mg Tabs (Quinapril-hydrochlorothiazide) .... 2 tablets by mouth daily 12)  Triamcinolone Acetonide 0.5 % Crea (Triamcinolone acetonide) .... Three times a day as needed for itching 13)  Metformin Hcl 500 Mg Tabs (Metformin hcl) .... One tab by mouth two times a day for dm 14)  Lyrica 50 Mg  Caps (Pregabalin) .... One tab by mouth nightly for one week then one tab by mouth two times a day for one week then one tab by mouth three times a day for neuropathy  Other Orders: CBC w/Diff-[facility] (62836)  Patient Instructions: 1)  it was a pleasure to meet you today.  2)  Please make a follow up appointment for 2 weeks for results and DM management.  3)  starting lyrica for nerve pain.  be cautious with effexor as it may tend to make you more sleepy.  take initially at night and allow adequate time for sleep.  discontinue if you notice any adverse reactions to the medication and call the office.  4)  go to the emergency room if with any fever, chills, difficulty breathing, chest pain, or any other concerning symptoms.  Prescriptions: LYRICA 50 MG CAPS (PREGABALIN) one tab by mouth nightly for one week then one tab by mouth two times a day for one week then one tab by mouth three times a day for neuropathy  #90 x 0   Entered and Authorized by:   Willaim Bane MD   Signed by:   Willaim Bane MD on 05/28/2010   Method used:   Print then Give to Patient   RxID:   [id number] METFORMIN HCL 500 MG TABS (METFORMIN HCL) one tab by mouth two times a day for DM  #60 x 0   Entered and Authorized by:   Willaim Bane MD   Signed by:   Willaim Bane MD on 05/28/2010   Method used:   Print then Give to Patient   RxID:   [id number]    Orders Added: 1)  Comp Met-[facility] [96759-16384] 2)  Lipid-[facility] [66599-35701] 3)  TSH-[facility] [77939-03009] 4)  B12-[facility] [23300-76226] 5)  CBC w/Diff-[facility] [85025] 6)  A1C-[facility] [83036] 7)  Mammogram [Mammogram] 8)  Colonoscopy [Colon] 9)  UA Microalbumin-[facility] [33354] 10)  [place]- [facility] Level 4 [56256]

## 2010-07-26 NOTE — Progress Notes (Signed)
  Phone Note Other Incoming   Request: Send information Summary of Call: Request for records received from [facility]. Request forwarded to [facility].     

## 2010-07-26 NOTE — Assessment & Plan Note (Signed)
Summary: Follow up per MD/aj   Vital Signs:  Patient profile:   55 year old female Height:      67 inches (170.18 cm) Weight:      304.50 pounds (138.41 kg) BMI:     47.86 O2 Sat:      98 % on Room air Temp:     98.4 degrees F (36.89 degrees C) oral Pulse rate:   73 / minute Pulse rhythm:   regular BP sitting:   110 / 62  (left arm) Cuff size:   large  Vitals Entered By: Rebeca Alert CMA Deborra Medina) (June 11, 2010 10:03 AM)  O2 Flow:  Room air CC: Follow-up visit/aj Comments Pt is due for mammogram and maybe due for pap   Primary Dayzha Pogosyan:  Loanne Drilling  CC:  Follow-up visit/aj.  History of Present Illness: pt states she feels well in general, except for difficulty with concentration.  she takes less than the prescribed insulin dosage, out of fear of hypoglycemia.  no cbg record, but states cbg's are 200-300's.    Current Medications (verified): 1)  Adult Aspirin Low Strength 81 Mg  Tbdp (Aspirin) .... Take 1 By Mouth Qd 2)  Cyanocobalamin 1000 Mcg/ml Soln (Cyanocobalamin) .... Administer 1 Ml Intramuscularly Once A Month 3)  Vicodin 5-500 Mg  Tabs (Hydrocodone-Acetaminophen) .... Q4h As Needed Pain 4)  Insulin Syringe 29g X 1/2" 1 Ml  Misc (Insulin Syringe-Needle U-100) .... Use Two Times A Day 5)  Novolin 70/30 70-30 %  Susp (Insulin Isophane & Regular) .... 250 Units Each Am and 100 Each Pm 6)  Onetouch Ultra Test  Strp (Glucose Blood) .... Use Two Times A Day, and Lancets 7)  Furosemide 20 Mg Tabs (Furosemide) .... Take 1 By Mouth Qd 8)  Venlafaxine Hcl 225 Mg Xr24h-Tab (Venlafaxine Hcl) .Marland Kitchen.. 1 Once Daily 9)  Fluconazole 200 Mg Tabs (Fluconazole) .... Once Daily For 5 Days 10)  Lipitor 40 Mg Tabs (Atorvastatin Calcium) .... Take 2 Tablets By Mouth At Bedtime 11)  Accuretic 20-12.5 Mg Tabs (Quinapril-Hydrochlorothiazide) .... 2 Tablets By Mouth Daily 12)  Triamcinolone Acetonide 0.5 % Crea (Triamcinolone Acetonide) .... Three Times A Day As Needed For Itching 13)  Metformin  Hcl 500 Mg Tabs (Metformin Hcl) .... One Tab By Mouth Two Times A Day For Dm 14)  Lyrica 50 Mg Caps (Pregabalin) .... One Tab By Mouth Nightly For One Week Then One Tab By Mouth Two Times A Day For One Week Then One Tab By Mouth Three Times A Day For Neuropathy 15)  Diflucan 200 Mg Tabs (Fluconazole) .... Take 1 Tablet By Mouth Once Daily For 5 Days  Allergies (verified): 1)  ! Keflex 2)  ! Neosporin 3)  ! Adhesive Tape  Past History:  Past Medical History: Last updated: 05/28/2010 DYSLIPIDEMIA (ICD-272.4) GOITER, MULTINODULAR (ICD-241.1) ANEMIA, PERNICIOUS, HX OF (ICD-V12.3) PAIN IN SOFT TISSUES OF LIMB (ICD-729.5) DIABETES MELLITUS, TYPE I (ICD-250.01) NASH OA Knees DM Peripheral Sensory Neuropathy Depression OSA  Review of Systems  The patient denies hypoglycemia.    Physical Exam  General:  morbidly obese.  no distress  Neck:  there is a ? of a right thyroid nodule   Impression & Recommendations:  Problem # 1:  DIABETES MELLITUS, WITH NEUROLOGICAL COMPLICATIONS (QBH-419.37) needs increased rx therapy limited by cost considerations.  Problem # 2:  ? of thyroid nodule  Other Orders: Est. Patient Level II (90240)  Patient Instructions: 1)  check your blood sugar 1 time a day.  vary the  time of day when you check, between before the 3 meals, and at bedtime.  also check if you have symptoms of your blood sugar being too high or too low.  please keep a record of the readings and bring it to your next appointment here.  please call us sooner if you are having low blood sugar episodes. 2)  Please schedule a follow-up appointment in 2 months. 3)  increase insulin to 250 units with breakfast, and 100 units with the evening meal. 4)  if you wish, we can wait until you get medicaid, to check out the thyroid nodule.   Orders Added: 1)  Est. Patient Level II [79987]

## 2010-07-26 NOTE — Progress Notes (Signed)
  Phone Note Refill Request Message from:  Fax from Pharmacy on February 01, 2010 11:38 AM  Refills Requested: Medication #1:  NOVOLIN 70/30 70-30 %  SUSP 175 units qam and 75 qpm   Dosage confirmed as above?Dosage Confirmed   Last Refilled: 02/14/2009  Method Requested: Fax to [facility] Initial call taken by: Rebeca Alert MA,  February 01, 2010 11:38 AM    Prescriptions: NOVOLIN 70/30 70-30 %  SUSP (INSULIN ISOPHANE & REGULAR) 175 units qam and 75 qpm  #30 x 1   Entered by:   Rebeca Alert MA   Authorized by:   Donavan Foil MD   Signed by:   Rebeca Alert MA on 02/01/2010   Method used:   Faxed to ...       [facility] (retail)       [address] [place], [place]  [address]       Ph: [phone]       Fax: [phone]   RxID:   [id number]

## 2010-07-26 NOTE — Progress Notes (Signed)
Summary: rx refill req  Phone Note Refill Request Message from:  Patient on March 28, 2010 9:02 AM  Refills Requested: Medication #1:  CYANOCOBALAMIN 1000 MCG/ML SOLN Administer 1 ml intramuscularly once a month   Dosage confirmed as above?Dosage Confirmed   Notes: [employer] [address]  Method Requested: Electronic Initial call taken by: Rebeca Alert MA,  March 28, 2010 9:02 AM    Prescriptions: CYANOCOBALAMIN 1000 MCG/ML SOLN (CYANOCOBALAMIN) Administer 1 ml intramuscularly once a month  #10 cc x 0   Entered by:   Rebeca Alert MA   Authorized by:   Donavan Foil MD   Signed by:   Rebeca Alert MA on 03/28/2010   Method used:   Electronically to        [facility] [phone]* (retail)       [address]       [place], [place]  [address]       Ph: [phone]       Fax: [phone]   RxID:   [id number]

## 2010-07-26 NOTE — Progress Notes (Signed)
Summary: rx refill req  Phone Note Refill Request Message from:  Fax from Pharmacy on June 26, 2010 11:04 AM  Refills Requested: Medication #1:  FLUCONAZOLE 200 MG TABS once daily for 5 days   Dosage confirmed as above?Dosage Confirmed   Last Refilled: 06/11/2010  Method Requested: Fax to [facility] Next Appointment Scheduled: none Initial call taken by: Rebeca Alert CMA ([place]),  June 26, 2010 11:06 AM    Prescriptions: FLUCONAZOLE 200 MG TABS (FLUCONAZOLE) once daily for 5 days  #5 x 0   Entered by:   Rebeca Alert CMA ([place])   Authorized by:   Donavan Foil MD   Signed by:   Rebeca Alert CMA ([place]) on 06/26/2010   Method used:   Faxed to ...       [facility] (retail)       [address] [place], [place]  [address]       Ph: [phone]       Fax: [phone]   RxID:   [id number]

## 2010-07-26 NOTE — Progress Notes (Signed)
Summary: [facility]_ [facility]  ROI_ [facility]   Imported By: Audie Clear 07/20/2010 15:47:57  _____________________________________________________________________  External Attachment:    Type:   Image     Comment:   External Document

## 2010-07-26 NOTE — Assessment & Plan Note (Signed)
Summary: F/U  [facility]   Vital Signs:  Patient profile:   55 year old female Height:      67 inches Weight:      308 pounds BMI:     48.41 Temp:     98.2 degrees F oral Pulse rate:   78 / minute BP sitting:   149 / 73  (left arm) Cuff size:   large  Vitals Entered By: Schuyler Amor CMA (July 12, 2010 1:33 PM)  CC: F/U DM Is Patient Diabetic? Yes Pain Assessment Patient in pain? yes     Location: right hand Intensity: 7   Primary Provider:  Loanne Drilling  CC:  F/U DM.  History of Present Illness: pt presents for follow up of DM  1. pt states she is compliant with her current regiment of 250/100.  she states her FBS are at best 120 but may be up to 200s in the am.  she did have one episode of asymptomatic hypoglycemia of 67 due to lack of eating.  her avg blood sugar is 200s.    2. DM peripheral neuropathy-improved on neurontin.  she does notice some somnolence so she takes it twice daily.    3. she also notes sharp right hand pain no associated with any known trauma, radiation, or paresthesias for the past 2-3 days that is intermittent.  she has not tried any medication.    Allergies: 1)  ! Keflex 2)  ! Neosporin 3)  ! Adhesive Tape  Past History:  Past medical, surgical, family and social histories (including risk factors) reviewed, and no changes noted (except as noted below).  Past Medical History: Reviewed history from 05/28/2010 and no changes required. DYSLIPIDEMIA (ICD-272.4) GOITER, MULTINODULAR (ICD-241.1) ANEMIA, PERNICIOUS, HX OF (ICD-V12.3) PAIN IN SOFT TISSUES OF LIMB (ICD-729.5) DIABETES MELLITUS, TYPE I (ICD-250.01) NASH OA Knees DM Peripheral Sensory Neuropathy Depression OSA  Past Surgical History: Reviewed history from 10/06/2007 and no changes required. Exercise Myoview (01/24/2005) EKG (04/16/2006) arthroscopy right knee - '03 vein stripping righ leg- remotef Tonsillectomy  Family History: Reviewed history from 10/06/2007 and no changes  required. father-deceased @ 51: pneumonia, HTN, Lipid, cirrhosis, colon cancer mother - deceased @50 : cerebral aneurysm. Neg- breast cancer, DM, CAD Sister with colon cancer  Social History: Reviewed history from 05/28/2010 and no changes required. HSG married '91  [id number] x2, 1son '91 8.5 lbs, 1 daughter '92 10lbs unemployed  Physical Exam  General:  Well-developed,well-nourished,in no acute distress; alert,appropriate and cooperative throughout examination Lungs:  Normal respiratory effort, chest expands symmetrically. Lungs are clear to auscultation, no crackles or wheezes. Heart:  Normal rate and regular rhythm. S1 and S2 normal without gallop, murmur, click, rub or other extra sounds. Extremities:  right hand with some mild tenderness to palpation the dorsum of the hand with ecchymosis present.  no wrist pain, no joint swelling, full ROM. unable to elicit pain.  Skin:  bilateral hands with thickened hands with fissures and cracking.  extremely dry.   Impression & Recommendations:  Problem # 1:  HYPERTENSION (ICD-401.9) Assessment Unchanged  Her updated medication list for this problem includes:    Furosemide 20 Mg Tabs (Furosemide) .Marland Kitchen... Take 1 by mouth qd    Accuretic 20-12.5 Mg Tabs (Quinapril-hydrochlorothiazide) .Marland Kitchen... 2 tablets by mouth daily    Norvasc 5 Mg Tabs (Amlodipine besylate) ..... One tab by mouth daily for hypertension  BP not at goal.  pt currently on max of quinapril and HCTZ.  added norvasc.  pt to follow up in  2 weeks for BP check.  pt given pneumovax today as well.   Orders: [place]- Est  Level 4 (53664)  Problem # 2:  DIABETES MELLITUS, WITH NEUROLOGICAL COMPLICATIONS (QIH-474.25) Assessment: Improved  Her updated medication list for this problem includes:    Adult Aspirin Low Strength 81 Mg Tbdp (Aspirin) .Marland Kitchen... Take 1 by mouth qd    Novolin 70/30 70-30 % Susp (Insulin isophane & regular) .Marland Kitchen... 250 units each am and 100 each pm    Accuretic  20-12.5 Mg Tabs (Quinapril-hydrochlorothiazide) .Marland Kitchen... 2 tablets by mouth daily    Metformin Hcl 500 Mg Tabs (Metformin hcl) ..... One tab by mouth two times a day for dm  Orders: A1C-[facility] (95638) UA Microalbumin-[facility] (75643) [place]- Est  Level 4 (32951)  continue current medication regiment.  awaiting A1C measurement.   Problem # 3:  HAND PAIN, RIGHT (ICD-729.5) Assessment: New  unsure of etiology at this time, but clinically suggestive of neuropathic origiin.  will continue neurotin and pt may take tylenol as needed for the pain.    Orders: [place]- Est  Level 4 (88416)  Complete Medication List: 1)  Adult Aspirin Low Strength 81 Mg Tbdp (Aspirin) .... Take 1 by mouth qd 2)  Cyanocobalamin 1000 Mcg/ml Soln (Cyanocobalamin) .... Administer 1 ml intramuscularly once a month 3)  Vicodin 5-500 Mg Tabs (Hydrocodone-acetaminophen) .... Q4h as needed pain 4)  Insulin Syringe 29g X 1/2" 1 Ml Misc (Insulin syringe-needle u-100) .... Use two times a day 5)  Novolin 70/30 70-30 % Susp (Insulin isophane & regular) .... 250 units each am and 100 each pm 6)  Onetouch Ultra Test Strp (Glucose blood) .... Use two times a day, and lancets 7)  Furosemide 20 Mg Tabs (Furosemide) .... Take 1 by mouth qd 8)  Venlafaxine Hcl 225 Mg Xr24h-tab (Venlafaxine hcl) .Marland Kitchen.. 1 once daily 9)  Fluconazole 200 Mg Tabs (Fluconazole) .... Once daily for 5 days 10)  Lipitor 40 Mg Tabs (Atorvastatin calcium) .... Take 2 tablets by mouth at bedtime 11)  Accuretic 20-12.5 Mg Tabs (Quinapril-hydrochlorothiazide) .... 2 tablets by mouth daily 12)  Triamcinolone Acetonide 0.5 % Crea (Triamcinolone acetonide) .... Three times a day as needed for itching 13)  Metformin Hcl 500 Mg Tabs (Metformin hcl) .... One tab by mouth two times a day for dm 14)  Neurontin 300 Mg Caps (Gabapentin) .... One tab by mouth three times a day for neuropathy 15)  Norvasc 5 Mg Tabs (Amlodipine besylate) .... One tab by mouth daily for hypertension  Other  Orders: Pneumococcal Vaccine (60630) Admin 1st Vaccine [phone])  Patient Instructions: 1)  It was a pleasure to care for you today.  2)  Please schedule a follow-up appointment in 2 weeks for BP check and follow up diabetes. 3)  Increase your evening insulin to 150 Units.   4)  continue to check your blood sugar 5 times daily-once in the morning and evening as well as 2 hours after eating.  5)  you may take tylenol as needed for musculoskeletal pain.  6)  return to the ED if with worsening pain, any stroke symptoms, chest pain, or any other concerning symptoms.  7)  You may check your blood pressure at home as you see necessary.   Prescriptions: NORVASC 5 MG TABS (AMLODIPINE BESYLATE) one tab by mouth daily for hypertension  #30 x 1   Entered and Authorized by:   Willaim Bane MD   Signed by:   Willaim Bane MD on 07/12/2010   Method  used:   Print then Give to Patient   RxID:   [id number]    Orders Added: 1)  A1C-[facility] [83036] 2)  UA Microalbumin-[facility] [82044] 3)  Pneumococcal Vaccine [90732] 4)  Admin 1st Vaccine [90471] 5)  [place]- Est  Level 4 [85277]   Immunizations Administered:  Pneumonia Vaccine:    Vaccine Type: Pneumovax    Site: left deltoid    Mfr: merk & co    Dose: 0.5 ml    Route: IM    Given by: Schuyler Amor CMA    Exp. Date: 11/16/2011    Lot #: [id number]    VIS given: 05/29/09 version given July 12, 2010.   Immunizations Administered:  Pneumonia Vaccine:    Vaccine Type: Pneumovax    Site: left deltoid    Mfr: merk & co    Dose: 0.5 ml    Route: IM    Given by: Schuyler Amor CMA    Exp. Date: 11/16/2011    Lot #: [id number]    VIS given: 05/29/09 version given July 12, 2010.  Laboratory Results   Urine Tests  Date/Time Received: July 12, 2010 2:09 PM  Date/Time Reported: July 12, 2010 2:31 PM   Microalbumin (urine): 80 mg/L Creatinine: 25m/dL  A:C Ratio 30 - 300 Abnormal Comments: ...............test performed  by.......Marland Kitchenonnie A. JMartinique MLS (ASCP)cm   Blood Tests   Date/Time Received: July 12, 2010 2:09 PM  Date/Time Reported: July 12, 2010 2:30 PM   HGBA1C: 11.1%   (Normal Range: Non-Diabetic - 3-6%   Control Diabetic - 6-8%)  Comments: ...............test performed by.......Marland Kitchenonnie A. JMartinique MLS (ASCP)cm

## 2010-07-26 NOTE — Progress Notes (Signed)
Summary: Rash  Phone Note Call from Patient   Caller: Patient [phone] Summary of Call: pt called stating that rash she was treated for last OV with Dr. Asa Lente has not gotten any better. pt says the cream has did not help. pt is requesting a stronger medication. Initial call taken by: Crissie Sickles, [place],  July 21, 2009 9:49 AM  Follow-up for Phone Call        rx sent Follow-up by: Donavan Foil MD,  July 21, 2009 12:51 PM  Additional Follow-up for Phone Call Additional follow up Details #1::        pt informed Additional Follow-up by: Crissie Sickles, CMA,  July 21, 2009 1:20 PM    New/Updated Medications: LIDEX 0.05 % CREA (FLUOCINONIDE) three times a day as needed rash Prescriptions: VENLAFAXINE HCL 225 MG XR24H-TAB (VENLAFAXINE HCL) qd  #30 x 4   Entered by:   Crissie Sickles, CMA   Authorized by:   Donavan Foil MD   Signed by:   Crissie Sickles, CMA on 07/21/2009   Method used:   Faxed to ...       [facility] (retail)       [place], [place]  [address]       Ph: [phone]       Fax: [phone]   RxID:   [id number] LIDEX 0.05 % CREA (FLUOCINONIDE) three times a day as needed rash  #1 med tube x 1   Entered and Authorized by:   Donavan Foil MD   Signed by:   Donavan Foil MD on 07/21/2009   Method used:   Electronically to        [facility] [phone]* (retail)       [address]       [place],   [address]       Ph: [phone]       Fax: [phone]   RxID:   [id number]

## 2010-07-26 NOTE — Progress Notes (Signed)
Summary: rx refill req  Phone Note Refill Request Message from:  Fax from Pharmacy on July 16, 2010 11:20 AM  Refills Requested: Medication #1:  FLUCONAZOLE 200 MG TABS once daily for 5 days   Dosage confirmed as above?Dosage Confirmed  Medication #2:  ACCURETIC 20-12.5 MG TABS 2 tablets by mouth daily   Dosage confirmed as above?Dosage Confirmed  Method Requested: Fax to Local Pharmacy Next Appointment Scheduled: 08/06/2010 Initial call taken by: Rebeca Alert CMA ([place]),  July 16, 2010 11:28 AM    Prescriptions: ACCURETIC 20-12.5 MG TABS (QUINAPRIL-HYDROCHLOROTHIAZIDE) 2 tablets by mouth daily  #180 x 0   Entered by:   Rebeca Alert CMA ([place])   Authorized by:   Donavan Foil MD   Signed by:   Rebeca Alert CMA ([place]) on 07/16/2010   Method used:   Faxed to ...       [facility] (retail)       [address], [place]  [address]       Ph: [phone]       Fax: [phone]   RxID:   [id number] LIPITOR 40 MG TABS (ATORVASTATIN CALCIUM) Take 2 tablets by mouth at bedtime  #180 x 0   Entered by:   Rebeca Alert CMA ([place])   Authorized by:   Donavan Foil MD   Signed by:   Rebeca Alert CMA ([place]) on 07/16/2010   Method used:   Faxed to ...       [facility] (retail)       [address] [place], [place]  [address]       Ph: [phone]       Fax: [phone]   RxID:   [id number]

## 2010-07-26 NOTE — Medication Information (Signed)
Summary: Medication Assistance Program / [place] / [place] By: Rise Patience 08/24/2009 14:52:41  _____________________________________________________________________  External Attachment:    Type:   Image     Comment:   External Document

## 2010-07-26 NOTE — Progress Notes (Signed)
Summary: Venlafaxine  Phone Note Refill Request Message from:  Fax from Pharmacy on January 09, 2010 10:51 AM  Refills Requested: Medication #1:  VENLAFAXINE HCL 225 MG XR24H-TAB qd   Dosage confirmed as above?Dosage Confirmed   Last Refilled: 07/21/2009  Method Requested: Fax to [facility] Initial call taken by: Rebeca Alert MA,  January 09, 2010 10:51 AM    Prescriptions: VENLAFAXINE HCL 225 MG XR24H-TAB (VENLAFAXINE HCL) qd  #30 x 1   Entered by:   Rebeca Alert MA   Authorized by:   Donavan Foil MD   Signed by:   Rebeca Alert MA on 01/09/2010   Method used:   Faxed to ...       [facility] (retail)       [address] [place], [place]  [address]       Ph: [phone]       Fax: [phone]   RxID:   [id number]

## 2010-08-06 ENCOUNTER — Ambulatory Visit (INDEPENDENT_AMBULATORY_CARE_PROVIDER_SITE_OTHER): Payer: Self-pay | Admitting: Family Medicine

## 2010-08-06 ENCOUNTER — Encounter: Payer: Self-pay | Admitting: Family Medicine

## 2010-08-06 VITALS — BP 148/80 | HR 86 | Temp 98.5°F | Ht 70.0 in | Wt 308.0 lb

## 2010-08-06 DIAGNOSIS — E1149 Type 2 diabetes mellitus with other diabetic neurological complication: Secondary | ICD-10-CM

## 2010-08-06 DIAGNOSIS — I10 Essential (primary) hypertension: Secondary | ICD-10-CM

## 2010-08-06 DIAGNOSIS — Z0000 Encounter for general adult medical examination without abnormal findings: Secondary | ICD-10-CM

## 2010-08-06 DIAGNOSIS — Z1231 Encounter for screening mammogram for malignant neoplasm of breast: Secondary | ICD-10-CM

## 2010-08-06 MED ORDER — AMLODIPINE BESYLATE 10 MG PO TABS: 10.0000 mg | ORAL_TABLET | Freq: Every day | ORAL | Status: DC

## 2010-08-06 MED ORDER — GABAPENTIN 300 MG PO CAPS: 300.0000 mg | ORAL_CAPSULE | Freq: Three times a day (TID) | ORAL | Status: DC

## 2010-08-06 MED ORDER — METFORMIN HCL 1000 MG PO TABS: 1000.0000 mg | ORAL_TABLET | Freq: Two times a day (BID) | ORAL | Status: DC

## 2010-08-06 NOTE — Patient Instructions (Signed)
It was a pleasure to care for you today.  Please make a follow up appointment in 2 weeks for blood pressure check.  Take new medication as prescribed.  Return to the ED or immediately to the office if with any worsening headache, blurry vision, hypoglycemia, etc.

## 2010-08-06 NOTE — Progress Notes (Signed)
Subjective:     [name] is an 55 y.o. female who presents for follow up of diabetes. Current symptoms include: hypoglycemia  and paresthesia of the feet. Patient denies nausea, polydipsia, polyuria, visual disturbances, vomiting and weight loss. Evaluation to date has included: hemoglobin A1C and microalbuminuria. Home sugars: BGs range between 100 and 200 in the am. Current treatments: added metformin however, she is only taking 1g once daily at night.  she also has been recently added on neurontin for her neuropathy which she is only taking once daily as well. . Last dilated eye exam 2011.    Pt also states she has a positive family history of colon cancer in dad and sister in their 4-50s.   She currently denies any hematochezia, melena or weight loss.   She also is currently UTD on her pap smear.  However, she does need a mammogram.   Review of Systems Pertinent items are noted in HPI.    Objective:    BP 148/80  Pulse 86  Temp(Src) 98.5 F (36.9 C) (Oral)  Ht 5' 10"  (1.778 m)  Wt 308 lb (139.708 kg)  BMI 44.19 kg/m2  General Appearance:    Alert, cooperative, no distress, appears stated age  Head:    Normocephalic, without obvious abnormality, atraumatic  Eyes:    PERRL, conjunctiva/corneas clear, EOM's intact, fundi    benign, both eyes  Ears:    Normal TM's and external ear canals, both ears  Nose:   Nares normal, septum midline, mucosa normal, no drainage    or sinus tenderness  Throat:   Lips, mucosa, and tongue normal; teeth and gums normal  Neck:   Supple, symmetrical, trachea midline, no adenopathy;    thyroid:  no enlargement/tenderness/nodules; no carotid   bruit or JVD  Back:     Symmetric, no curvature, ROM normal, no CVA tenderness  Lungs:     Clear to auscultation bilaterally, respirations unlabored  Chest Wall:    No tenderness or deformity   Heart:    Regular rate and rhythm, S1 and S2 normal, no murmur, rub   or gallop  Breast Exam:    No tenderness,  masses, or nipple abnormality  Abdomen:     Soft, non-tender, bowel sounds active all four quadrants,    no masses, no organomegaly  Genitalia:    Normal female without lesion, discharge or tenderness  Rectal:    Normal tone, normal prostate, no masses or tenderness;   guaiac negative stool  Extremities:   Extremities normal, atraumatic, no cyanosis or edema  Pulses:   2+ and symmetric all extremities  Skin:   Skin color, texture, turgor normal, no rashes or lesions  Lymph nodes:   Cervical, supraclavicular, and axillary nodes normal  Neurologic:   CNII-XII intact, normal strength, sensation and reflexes    throughout    Laboratory: No components found with this basename: A1C      Assessment:    Diabetes related complications: peripheral neuropathy and cardiovascular disease    Plan:    Addressed ADA diet. Encouraged aerobic exercise. Discussed ways to avoid symptomatic hypoglycemia. Increased dose of metformin; see  medication orders. Nutritionist referral. Follow up in 2 weeks or as needed.  Increased Neurontin to 361m TID Increased norvasc to 176mdaily for better blood pressure control.

## 2010-08-06 NOTE — Progress Notes (Signed)
Asha  To complete the referrals for this pt.

## 2010-08-06 NOTE — Progress Notes (Deleted)
  Subjective:    Patient ID: [name], female    DOB: June 09, 1956, 55 y.o.   MRN: [id number]  HPI    Review of Systems     Objective:   Physical Exam        Assessment & Plan:

## 2010-08-09 ENCOUNTER — Other Ambulatory Visit: Payer: Self-pay | Admitting: Family Medicine

## 2010-08-09 DIAGNOSIS — E1149 Type 2 diabetes mellitus with other diabetic neurological complication: Secondary | ICD-10-CM

## 2010-08-09 MED ORDER — ONETOUCH ULTRA MINI W/DEVICE KIT: PACK | Status: DC

## 2010-08-10 ENCOUNTER — Ambulatory Visit (HOSPITAL_COMMUNITY): Payer: Self-pay

## 2010-08-17 ENCOUNTER — Encounter (HOSPITAL_COMMUNITY): Payer: Self-pay

## 2010-08-17 ENCOUNTER — Ambulatory Visit (HOSPITAL_COMMUNITY)
Admission: RE | Admit: 2010-08-17 | Discharge: 2010-08-17 | Disposition: A | Discharge status: Home / Self Care | Payer: Self-pay | Source: Ambulatory Visit | Attending: Family Medicine | Admitting: Family Medicine

## 2010-08-17 DIAGNOSIS — Z1231 Encounter for screening mammogram for malignant neoplasm of breast: Secondary | ICD-10-CM

## 2010-08-22 ENCOUNTER — Ambulatory Visit: Payer: Self-pay

## 2010-08-24 ENCOUNTER — Ambulatory Visit: Payer: Self-pay | Admitting: Family Medicine

## 2010-08-27 ENCOUNTER — Ambulatory Visit: Payer: Self-pay | Admitting: Family Medicine

## 2010-08-27 ENCOUNTER — Encounter: Payer: Self-pay | Admitting: Family Medicine

## 2010-08-27 ENCOUNTER — Ambulatory Visit (INDEPENDENT_AMBULATORY_CARE_PROVIDER_SITE_OTHER): Payer: Self-pay | Admitting: Family Medicine

## 2010-08-27 DIAGNOSIS — I10 Essential (primary) hypertension: Secondary | ICD-10-CM

## 2010-08-27 DIAGNOSIS — L408 Other psoriasis: Secondary | ICD-10-CM

## 2010-08-27 DIAGNOSIS — E1149 Type 2 diabetes mellitus with other diabetic neurological complication: Secondary | ICD-10-CM

## 2010-08-27 MED ORDER — CLOBETASOL PROPIONATE 0.05 % EX CREA: TOPICAL_CREAM | Freq: Two times a day (BID) | CUTANEOUS | Status: AC

## 2010-08-27 MED ORDER — METOPROLOL SUCCINATE ER 50 MG PO TB24: 50.0000 mg | ORAL_TABLET | Freq: Every day | ORAL | Status: DC

## 2010-08-27 NOTE — Progress Notes (Signed)
Medical Nutrition Therapy:  Appt start time: 4818 end time:  1600.  Assessment:  Primary concerns today: Weight management, Blood sugar control and appetite management. Usual eating pattern is erratic; usually eats breakfast, but frequently misses meals.  Experiences hypoglycemia (i.e., 50's) 2-3 X mo.  Everyday foods include 2, 2-liter bottles of diet cola, 1-2 c coffee w/ artif swtnr & flvrd creamer or sk milk 3 X wk.  Avoided foods include most fruit (but will eat oranges, apples, bananas).  No consistent physical activity, but would like to start; esp loves to swim.   Ms. Forrest lost 40 lb on Nutri-system in 2010, and she has lost 30 lb w/ Wt [employer]chers in the past.  24-hr recall suggests intake of ~1800 kcal: B- none; L (2 PM)- large diet coke, 2 hotdogs, med onion rings; D (8 PM)- large can of chx noodle soup, whole sleeve Saltines, handful Pringles, diet Coke; Snk ( PM)- soft Sweet Tarts, diet Coke.  On many days, night time eating is much greater.  Ms. Rincon describes herself as "addicted" to both TV and food.  She often feels out of control w/ respect to eating at night.  Ms. Pokorney' husband has MS, and he requires quite a lot of care.  She usually does not go to bed until 3 or 4 AM, and is always up by ~8 AM to help her husband.    Progress Towards Goal(s):  In progress.   Nutritional Diagnosis:  NB-1.5 Disordered eating pattern As related to compulsive eating.  As evidenced by pt report of frequently feeling out of control with respect to night-time eating. NB-2.1 Physical inactivity As related to poor motivation.  As evidenced by no current exercise.  .    Intervention:  Nutrition education.   Monitoring/Evaluation:  Dietary intake, activity, blood sugars, and weight in 4 week(s).

## 2010-08-27 NOTE — Patient Instructions (Signed)
-   Eat at least 3 meals and 1-2 snacks per day.  Aim for no more than 5 hours between eating. - SLEEP DEPRIVATION IS PROBABLY CONTRIBUTING TO YOUR WEIGHT MANAGEMENT PROBLEMS!  This means to start using your CPAP machine and to start going to bed earlier!!!! - The way to go to bed earlier, is to cut back by about an hour each week, then continue to go to bed progressively earlier each week.   - Obtain twice as many veg's as protein or carbohydrate foods for both lunch and dinner, and USE A SMALL PLATE. - Physical activity:  Stepper at home for 3-5 min 3-5 X wk.  Record # minutes you exercise; bring to follow-up appt.

## 2010-08-27 NOTE — Patient Instructions (Signed)
It was a pleasure to care for you today.  Stop the norvasc.  Start the metoprolol.  Please be cautious that it may cause some slowing of the heart rate so change positions slowly.  Be watchful of lightheadedness or dizziness.   I also started a higher dose medication of the steroid cream for the psoriasis.  Please use as instructed and increase moisturizing lotions.  Please follow up in 2-3 weeks for blood pressure.

## 2010-08-27 NOTE — Progress Notes (Signed)
Subjective:     [name] is a 55 y.o. female.  Chief Complaint: follow up DM, HTN, and psoriasis   HTN-pt stable.  Started on norvasc but now complains of lower extremity swelling.  She is otherwise taking lisinopril/hctz 20/12.5 two tabs twice daily.  She denies any other complaints.   DM-pt states fasting blood sugars are 130s-160s.  She is unsure of her other blood sugar readings.  In regards to her neuropathy she is still only remembering to take neurontin at the most twice daily.    Psoriasis-pt states her symptoms are worsening despite triamcinolone cream.  Her hands and shins are most affected.    Review of Systems Pertinent items are noted in HPI.    Objective:    BP 130/82  Pulse 77  Temp(Src) 98.1 F (36.7 C) (Oral)  Ht 5' 10"  (1.778 m)  Wt 323 lb 12.8 oz (146.875 kg)  BMI 46.46 kg/m2 BP 130/82  Pulse 77  Temp(Src) 98.1 F (36.7 C) (Oral)  Ht 5' 10"  (1.778 m)  Wt 323 lb 12.8 oz (146.875 kg)  BMI 46.46 kg/m2 General appearance: alert and cooperative Lungs: clear to auscultation bilaterally Heart: regular rate and rhythm, S1, S2 normal, no murmur, click, rub or gallop Abdomen: soft, non-tender; bowel sounds normal; no masses,  no organomegaly Skin: Skin color, texture, turgor normal. No rashes or lesions or psoriatic lesions on her bilateral hands with skin thickening and fissures, affected areas on her shins greatest on left LE shin    Assessment:     1. Other psoriasis   2. Unspecified essential hypertension   3. Type II or unspecified type diabetes mellitus without mention of complication, uncontrolled         Plan:     1. D/c norvasc.  Start metoprolol XL 50 mg daily.  2. Start clobetasol cream for psoriatic lesions 3. Neuropathy-continue neurontin pt may take two tabs po in the am and then one tab po in the evening.  4. Follow up in 2 weeks for BP check

## 2010-09-10 ENCOUNTER — Ambulatory Visit (INDEPENDENT_AMBULATORY_CARE_PROVIDER_SITE_OTHER): Payer: Self-pay | Admitting: Family Medicine

## 2010-09-10 DIAGNOSIS — E1149 Type 2 diabetes mellitus with other diabetic neurological complication: Secondary | ICD-10-CM

## 2010-09-10 NOTE — Progress Notes (Signed)
Medical Nutrition Therapy:  Appt start time: 5974 end time:  1630.  Assessment:  Primary concerns today: Weight management and Blood sugar control. [name] said BG has been great, but she has not been exercising yet.  She said stress has been part of the reason, especially the constant care of her husband who has MS, and she doesn't feel that she handles the stress well.  She has the grandson of a friend renting a room at their house, which has caused more stress.  [name] says she is addicted to TV, and this is another obstacle.  The reality is that her night time eating and TV is her one escape and stress relief, and her only leisure time during the day.  She has not yet used her CPAP machine for sleep; simply has not got around to dusting and rinsing it off, and doesn't feel like it when she is finally getting to bed ~3 AM.  At least this bedtime represents an improvement over the 5 AM bedtime she was previously doing.  I have not yet emphasized dietary changes for [name] to work on so as to not overwhelm her.   Progress Towards Goal(s):  In progress.   Nutritional Diagnosis:  No progress on disordered eating pattern (NB-1.5) as related to compulsive eating as evidenced by pt report of continued out-of-control night-time eating.  No progress on physical inactivity (NB-2.1) as related to poor motivation as evidenced by no current exercise yet.    Intervention:  Nutrition counseling.   Monitoring/Evaluation:  Dietary intake in 2 week(s).   Nutritional Diagnosis:  NB-1.5 Disordered eating pattern As related to compulsive eating.  As evidenced by pt report of frequently feeling out of control with respect to night-time eating. NB-2.1 Physical inactivity As related to poor motivation.  As evidenced by no current exercise.  Marland Kitchen

## 2010-09-10 NOTE — Patient Instructions (Addendum)
-   TV:  Ask yourself if what you are watching is enhancing your life.  Is it feeding your soul?  Try this experiment:  Pick a [employer] channel show; pay attn to how you feel during and after it.  Contrast this to one of the celebrity shows.  (Planet Earth; Frozen Planet are great choices.) - CPAP machine nightly!  Dust it off, and rinse it when you get home this afternoon.   - Bedtime goals:  No later than 2 AM this week, and 1 AM next week.   - Record:  # minutes of exercise (& what type), days CPAP is used, bedtimes.   (Food goals to work toward:  Eat at least 3 meals and 1-2 snacks per day.  Aim for no more than 5 hours between eating.) Unlimited foods:  Vegetables (nonstarchy) and fruits (except eat only 1/2 banana at a time).   Obtain twice as many veg's as protein or carbohydrate foods for both lunch and dinner.  Happy Birthday!  NEXT APPT:  1:30 ON APR 3 (EVEN THOUGH YOUR REMINDER PHONE CALL WILL SAY 2 PM).   At this appt, we will explore some more specific dietary recommendations.

## 2010-09-17 ENCOUNTER — Encounter: Payer: Self-pay | Admitting: Family Medicine

## 2010-09-17 ENCOUNTER — Ambulatory Visit (INDEPENDENT_AMBULATORY_CARE_PROVIDER_SITE_OTHER): Payer: Self-pay | Admitting: Family Medicine

## 2010-09-17 VITALS — BP 114/69 | HR 76 | Temp 97.7°F | Ht 70.0 in | Wt 323.0 lb

## 2010-09-17 DIAGNOSIS — E1149 Type 2 diabetes mellitus with other diabetic neurological complication: Secondary | ICD-10-CM

## 2010-09-17 LAB — BASIC METABOLIC PANEL
BUN: 17 mg/dL (ref 6–23)
Chloride: 101 mEq/L (ref 96–112)
Potassium: 4.3 mEq/L (ref 3.5–5.3)
Sodium: 141 mEq/L (ref 135–145)

## 2010-09-17 MED ORDER — GABAPENTIN 300 MG PO CAPS: 600.0000 mg | ORAL_CAPSULE | Freq: Three times a day (TID) | ORAL | Status: DC

## 2010-09-17 NOTE — Progress Notes (Signed)
Diabetes Mellitus Type II, Follow-up Patient here for follow-up of Type 2 diabetes mellitus.  Current symptoms/problems include paresthesia of the feet and have been worsening. Symptoms have been present for several months.  Known diabetic complications: peripheral neuropathy and peripheral vascular disease Cardiovascular risk factors: advanced age (older than 83 for men, 58 for women), diabetes mellitus, dyslipidemia, hypertension, obesity (BMI >= 30 kg/m2) and sedentary lifestyle Current diabetic medications include metformin and 70/30 insulin.   Eye exam current (within one year): yes Weight trend: stable Prior visit with dietician: yes - with successful lifestyle changes and improved sleep and diet Current diet: pt attempting to adjust eating habits and make healthier choices. Current exercise: none  Current monitoring regimen: home blood tests - 3 times daily Home blood sugar records: fasting range: 100s Any episodes of hypoglycemia? yes - only with decreased po intake.    Is She on ACE inhibitor or angiotensin II receptor blocker?  Yes  quinapril (Accupril) no arb  She also is being seen for her psoriasis which is improved with application of topical steroids twice daily.   Regarding her hypertension, she is using her CPAP machine and having decreased night time awakenings.    Regarding her neuropathy. She states her feet are still burning despite the medication.  She denies any lesions.    VS: see above Gen: nad, aao4 Chest: CTAB CV: RRR no m/r/g Extr: LLE with anterior skin changes from previous vein surgery, 2+DP bilaterally, no lesions notable on exam, tr pitting edema bilateral lower extremities, Bilateral hands with marked improvement with decreased fissures and erythema. Increased mobility.   A/P:  1. DMII with complications: check WLN9G today with urine microalbumin and BMP for creatinine.  Continue current regiment 2. Neuropathy: increase neurontin to 622m TID 3.  Well adult care: pap uptodate. mammo up to date 4. Psoriasis-continue topical BID application 5. F/u one month.

## 2010-09-17 NOTE — Patient Instructions (Signed)
It was a pleasure to care for you today.  Please complete your labs today.  Please make a follow up appointment in one month to reassess your neuropathy and DM.

## 2010-09-18 LAB — MICROALBUMIN / CREATININE URINE RATIO: Creatinine, Urine: 100.4 mg/dL

## 2010-09-20 ENCOUNTER — Telehealth: Payer: Self-pay | Admitting: Family Medicine

## 2010-09-20 NOTE — Telephone Encounter (Signed)
Saw Dr Megan Salon last week and was told that she could have some Lasix - it was not called in and now patient needs it.  Not sure what to do  [facility]

## 2010-09-20 NOTE — Telephone Encounter (Signed)
Pt returned call and will be waiting at phone for you to call her back.  Please leave notation in chart that contact was made.

## 2010-09-25 ENCOUNTER — Ambulatory Visit (INDEPENDENT_AMBULATORY_CARE_PROVIDER_SITE_OTHER): Payer: Self-pay | Admitting: Family Medicine

## 2010-09-25 DIAGNOSIS — E1149 Type 2 diabetes mellitus with other diabetic neurological complication: Secondary | ICD-10-CM

## 2010-09-25 LAB — BASIC METABOLIC PANEL
Calcium: 9.6 mg/dL (ref 8.4–10.5)
GFR calc Af Amer: >60 mL/min (ref >60)
GFR calc non Af Amer: >60 mL/min (ref >60)
Glucose, Bld: 290 mg/dL — ABNORMAL HIGH (ref 70–99)
Potassium: 4.1 mEq/L (ref 3.5–5.1)
Sodium: 138 mEq/L (ref 135–145)

## 2010-09-25 LAB — CBC
HCT: 41.2 % (ref 36.0–46.0)
Hemoglobin: 13.8 g/dL (ref 12.0–15.0)
RBC: 5.11 MIL/uL (ref 3.87–5.11)
RDW: 14.8 % (ref 11.5–15.5)
WBC: 10.4 10*3/uL (ref 4.0–10.5)

## 2010-09-25 LAB — DIFFERENTIAL
Eosinophils Relative: 2 % (ref 0–5)
Lymphocytes Relative: 18 % (ref 12–46)
Lymphs Abs: 1.9 10*3/uL (ref 0.7–4.0)
Monocytes Absolute: 0.7 10*3/uL (ref 0.1–1.0)
Monocytes Relative: 7 % (ref 3–12)
Neutro Abs: 7.5 10*3/uL (ref 1.7–7.7)

## 2010-09-25 LAB — GLUCOSE, CAPILLARY

## 2010-09-25 NOTE — Progress Notes (Signed)
Medical Nutrition Therapy:  Appt start time: 8333 end time:  1630.  Assessment:  Primary concerns today: Weight management and Blood sugar control. 24-hr recall:  (Up ~8-9 AM; back to bed till 12 PM);  B (12 PM)- 2 instant high-fiber brown sugar cinnamon oatmeal, 2 c coffee w/ ff vanilla creamer, art swtnr; L (3 PM)- 2 slc baloney sandwich on whwht w/ mustard, water; D (PM)- 4 oz catfish, 2 c canned no-salt green beans, water; Snk (11 PM)- 6 oz albacore tuna, handful Pringles, diet flavored water.  Eulanda has moved her bedtimes up, as early as 12:40 AM last night.  She has also been using her CPAP machine nightly.  She has not been able to increase activity b/c her back has been too painful.  FBG has been 120s to 150s, continued improvement since starting metformin.  Timber stopped drinking sodas Sunday.  She is drinking exclusively water and diet flavored water.  She has also started making an effort to eat discrete meals rather than grazing all day.  We discussed today the effects of hyperpalatable foods on appetite, as well as the need for Doshia to find fulfillment and pleasure in her life independently of food.    Progress Towards Goal(s):  In progress.   Nutritional Diagnosis:  Some progress on disordered eating pattern (NB-1.5) as related to compulsive eating as evidenced by yesterday's evening snack of tuna and flavored water.  No progress on physical inactivity (NB-2.1) as related to back pain as evidenced by no current exercise yet.    Intervention:  Nutrition counseling.   Monitoring/Evaluation:  Dietary intake in 2 weeks.

## 2010-09-25 NOTE — Patient Instructions (Addendum)
-   April 25, 8:30 AM at [facility] ([facility] wing of hospital; ground floor across from parking deck):  [name] will be speaking on The End of Overeating.   - Highly palatable foods make [name] feel good.  Google [name] (60 Minutes interview, 09-23-10 or other info).   - Use your Food Decision algorithm this week, and write about it at least once.  Bring to next appt.  - Also in your journal:  Record minutes of exercise you do.    - Physical activity:  Pay attention to movements that are easier on your back, and try to incorporate a few minutes at a time of these activities.  (Try your husband's arm cycle ergometer.) - Obtain twice as many veg's as protein or carbohydrate foods for both lunch and dinner.  Choose fresh or frozen veg's instead of canned.

## 2010-09-26 ENCOUNTER — Other Ambulatory Visit: Payer: Self-pay | Admitting: Endocrinology

## 2010-09-26 NOTE — Telephone Encounter (Signed)
R'cd Fax from [facility] for refills for Novolin 70/30 and Diflucan (25m-1 tablet daily for 5 days). Please advise regarding Diflucan rx.

## 2010-09-27 NOTE — Telephone Encounter (Signed)
i reviewed centricity emr.  Pt now goes to [facility].

## 2010-09-27 NOTE — Telephone Encounter (Signed)
Rx faxed to [facility].

## 2010-10-02 ENCOUNTER — Other Ambulatory Visit: Payer: Self-pay | Admitting: Family Medicine

## 2010-10-02 MED ORDER — INSULIN ASPART PROT & ASPART (70-30 MIX) 100 UNIT/ML ~~LOC~~ SUSP: 250.0000 [IU] | Freq: Every day | SUBCUTANEOUS | Status: DC

## 2010-10-02 MED ORDER — INSULIN ASPART PROT & ASPART (70-30 MIX) 100 UNIT/ML ~~LOC~~ SUSP: 150.0000 [IU] | Freq: Every day | SUBCUTANEOUS | Status: DC

## 2010-10-02 NOTE — Telephone Encounter (Signed)
Refilled via fax request.  Called and verified large dose of insulin.  Also, she takes 150 units qhs (not prn).

## 2010-10-03 ENCOUNTER — Other Ambulatory Visit: Payer: Self-pay

## 2010-10-03 MED ORDER — INSULIN ASPART PROT & ASPART (70-30 MIX) 100 UNIT/ML ~~LOC~~ SUSP: 250.0000 [IU] | Freq: Every day | SUBCUTANEOUS | Status: DC

## 2010-10-03 MED ORDER — INSULIN ASPART PROT & ASPART (70-30 MIX) 100 UNIT/ML ~~LOC~~ SUSP: 150.0000 [IU] | Freq: Every day | SUBCUTANEOUS | Status: DC

## 2010-10-03 NOTE — Telephone Encounter (Signed)
A user error has taken place: encounter opened in error, closed for administrative reasons.

## 2010-10-03 NOTE — Telephone Encounter (Signed)
[facility] called stating pt was at their pharmacy to pick up refills that were requested 04/03. Per pharmacy pt states she is continuing to receive medication through them. Rx's refilled.

## 2010-10-10 ENCOUNTER — Emergency Department (HOSPITAL_COMMUNITY): Payer: Self-pay

## 2010-10-10 ENCOUNTER — Emergency Department (HOSPITAL_COMMUNITY)
Admission: EM | Admit: 2010-10-10 | Discharge: 2010-10-10 | Disposition: A | Discharge status: Home / Self Care | Payer: Self-pay | Attending: Emergency Medicine | Admitting: Emergency Medicine

## 2010-10-10 DIAGNOSIS — S91009A Unspecified open wound, unspecified ankle, initial encounter: Secondary | ICD-10-CM | POA: Insufficient documentation

## 2010-10-10 DIAGNOSIS — G8929 Other chronic pain: Secondary | ICD-10-CM | POA: Insufficient documentation

## 2010-10-10 DIAGNOSIS — S81009A Unspecified open wound, unspecified knee, initial encounter: Secondary | ICD-10-CM | POA: Insufficient documentation

## 2010-10-10 DIAGNOSIS — S8010XA Contusion of unspecified lower leg, initial encounter: Secondary | ICD-10-CM | POA: Insufficient documentation

## 2010-10-10 DIAGNOSIS — Z79899 Other long term (current) drug therapy: Secondary | ICD-10-CM | POA: Insufficient documentation

## 2010-10-10 DIAGNOSIS — M79609 Pain in unspecified limb: Secondary | ICD-10-CM | POA: Insufficient documentation

## 2010-10-10 DIAGNOSIS — M549 Dorsalgia, unspecified: Secondary | ICD-10-CM | POA: Insufficient documentation

## 2010-10-10 DIAGNOSIS — Z794 Long term (current) use of insulin: Secondary | ICD-10-CM | POA: Insufficient documentation

## 2010-10-10 DIAGNOSIS — Z7982 Long term (current) use of aspirin: Secondary | ICD-10-CM | POA: Insufficient documentation

## 2010-10-10 DIAGNOSIS — R209 Unspecified disturbances of skin sensation: Secondary | ICD-10-CM | POA: Insufficient documentation

## 2010-10-10 DIAGNOSIS — S8990XA Unspecified injury of unspecified lower leg, initial encounter: Secondary | ICD-10-CM | POA: Insufficient documentation

## 2010-10-10 DIAGNOSIS — E7800 Pure hypercholesterolemia, unspecified: Secondary | ICD-10-CM | POA: Insufficient documentation

## 2010-10-10 DIAGNOSIS — I10 Essential (primary) hypertension: Secondary | ICD-10-CM | POA: Insufficient documentation

## 2010-10-10 DIAGNOSIS — X58XXXA Exposure to other specified factors, initial encounter: Secondary | ICD-10-CM | POA: Insufficient documentation

## 2010-10-10 DIAGNOSIS — E119 Type 2 diabetes mellitus without complications: Secondary | ICD-10-CM | POA: Insufficient documentation

## 2010-10-11 ENCOUNTER — Encounter: Payer: Self-pay | Admitting: Family Medicine

## 2010-10-11 ENCOUNTER — Ambulatory Visit (INDEPENDENT_AMBULATORY_CARE_PROVIDER_SITE_OTHER): Payer: Self-pay | Admitting: Family Medicine

## 2010-10-11 VITALS — BP 145/72 | HR 84 | Temp 98.3°F | Ht 68.75 in | Wt 328.0 lb

## 2010-10-11 DIAGNOSIS — L02419 Cutaneous abscess of limb, unspecified: Secondary | ICD-10-CM

## 2010-10-11 DIAGNOSIS — L03115 Cellulitis of right lower limb: Secondary | ICD-10-CM | POA: Insufficient documentation

## 2010-10-11 DIAGNOSIS — I10 Essential (primary) hypertension: Secondary | ICD-10-CM

## 2010-10-11 HISTORY — DX: Cellulitis of right lower limb: L03.115

## 2010-10-11 MED ORDER — TRIAMTERENE-HCTZ 37.5-25 MG PO TABS: 1.0000 | ORAL_TABLET | Freq: Every day | ORAL | Status: DC

## 2010-10-11 MED ORDER — CLINDAMYCIN HCL 300 MG PO CAPS: 300.0000 mg | ORAL_CAPSULE | Freq: Three times a day (TID) | ORAL | Status: AC

## 2010-10-11 MED ORDER — QUINAPRIL HCL 40 MG PO TABS: 40.0000 mg | ORAL_TABLET | Freq: Every day | ORAL | Status: DC

## 2010-10-11 NOTE — Patient Instructions (Signed)
It was a pleasure to care for you today.  Please take the antibiotics as prescribed.  Please keep the area clean and dry.   Please return in one week for suture removal. I changed your blood pressure medication and you will need your labs checked in one to two weeks for potassium and creatinine.   Continue elevation and you may use compression stockings as needed for swelling.

## 2010-10-11 NOTE — Progress Notes (Signed)
Subjective:     [name] is a 55 y.o. female and is here for follow up from ED and follow up of DM. The patient reports problems - Right LE lesion s/p sutures, LE pain, and bilateral lower extremity swelling. . Pt states that her blood sugar is now back in the 200s but she ran out of her metformin.  She denies any fever,chills, nausea, or vomiting.  She is currently keeping her LE clean and dry. She continues to take her accupril with refractory edema.  She has a history of venous insufficiency greatest on the right lower extremity. She continues to complain of le paresthesias and pain along with weight gain.   History   Social History  . Marital Status: Married    Spouse Name: N/A    Number of Children: N/A  . Years of Education: N/A   Occupational History  . Not on file.   Social History Main Topics  . Smoking status: Former Smoker    Quit date: 08/06/1994  . Smokeless tobacco: Not on file  . Alcohol Use: Not on file  . Drug Use: Not on file  . Sexually Active: Not on file   Other Topics Concern  . Not on file   Social History Narrative  . No narrative on file   Health Maintenance  Topic Date Due  . Pap Smear  09/09/1972  . Colonoscopy  09/09/2004  . Influenza Vaccine  03/25/2011  . Mammogram  08/17/2012  . Tetanus/tdap  01/19/2015    The following portions of the patient's history were reviewed and updated as appropriate: allergies, current medications, past family history, past medical history, past social history, past surgical history and problem list.  Review of Systems Pertinent items are noted in HPI.   Objective:    BP 145/72  Pulse 84  Temp(Src) 98.3 F (36.8 C) (Oral)  Ht 5' 8.75" (1.746 m)  Wt 328 lb (148.78 kg)  BMI 48.79 kg/m2 General appearance: alert and cooperative Extremities: edema 1-2+ pitting edema with tenderness to palpation, 1+ DP bilaterally Pulses:  L brachial 2+ R brachial 2+  L radial 2+ R radial 2+  L inguinal 2+ R inguinal 2+    L popliteal 2+ R popliteal 2+  L posterior tibial 2+ R posterior tibial 2+  L dorsalis pedis 1+ R dorsalis pedis tr   Skin:  Vascular congestion changes on R LE with approximately 0.5 cm area ulcer w/ purulent discharge on the anterior aspect on her shin, sutures are present. No active bleeding notable.  Tenderness to palpation, +surrounding erythema Assessment:    1. Cellulitis 2. Refractory edema 3. Venous insufficiency 4. Hypertension 5. DM II     Plan:    1. Clindamycin for 10 days-keep the area clean and dry. RTC 1 week for suture removal. Wound culture taken.  2. Refractory edema-d/c accupretic. Restart accupril, start maxide.  Due to the pt being on ACEi and K sparing diuretic pt needs serial labs to monitor for hyperkalemia.  3. DM II-refilll metformin.  Pt concerned about weight gain.  Discussed with pt food journal and exercise.  Continue current care.  See After Visit Summary for Counseling Recommendations

## 2010-10-14 LAB — WOUND CULTURE
Gram Stain: NONE SEEN
Gram Stain: NONE SEEN

## 2010-10-16 ENCOUNTER — Ambulatory Visit (INDEPENDENT_AMBULATORY_CARE_PROVIDER_SITE_OTHER): Payer: Self-pay | Admitting: Family Medicine

## 2010-10-16 DIAGNOSIS — E1149 Type 2 diabetes mellitus with other diabetic neurological complication: Secondary | ICD-10-CM

## 2010-10-16 NOTE — Patient Instructions (Addendum)
-   When you eat soup, get in the habit of ALWAYS adding frozen veg's to it.  Microwave veg's in bowl; add soup, and reheat.  STOCK UP ON FROZEN VEG'S, AND USE THESE INSTEAD OF CANNED.   - Eat at least 3 meals and 1-2 snacks per day.  Aim for no more than 5 hours between eating.  - PLAN AHEAD FOR MEALS AND SNACKS.   - Make a list of 7-10 meals that are quick and easy to prepare, taste good, and that meet your nutritional needs (which means include 1/2 vegetables, 1/4 protein, and 1/4 starch).  Use this list as a basis for shopping, and keep it accessible.  Bring this to your next appt.    - When you are inclined to make a poor food choice, use the three Ds:  Delay (no urge lasts forever); Distract (get your mind on something other than food); Distance (get away from the food source).  - Write a list of benefits of taking better care of yourself.  Bring this to your next appt.    - AT YOUR NEXT APPT, WE WILL TALK ABOUT MORE SPECIFICS WITH REGARD TO CARBOHYDRATES IN YOUR DIET.  For now, let the "plate" model be your guide:  Twice as many veg's as protein or carbohydrate foods for both lunch and dinner.

## 2010-10-16 NOTE — Progress Notes (Signed)
Medical Nutrition Therapy:  Appt start time: 7494 end time:  1630.  Assessment:  Primary concerns today: Weight management and Blood sugar control. Whittney's daughter Elmyra Ricks accompanied her today.  Kista has been pretty successful getting to bed by 1 AM.  She is using her CPAP nightly.  24-hr recall suggests intake of 1400 kcal: B (2 PM)- 3 scrmbld eggs in marg, 2 toast w/ 1 tbsp marg, 4 sausage patties, coffee w/ diet creamer, 2 pkts Swt 'n Low; L- none; D (8 PM)- 1 hot dog, 2 c tomato soup, 1 sleeve saltines, diet flvrd water; Snk ( PM)- 1/2 can Pringles.  Roisin has been eating 2-3 meals/day.  BG have been running high since running out of her Metformin.  She will fill her Rxs tomorrow.  Jynesis has not managed to do any exercise, especially since her leg injury last week, when she had to go to the ER.  She gets the stitches out on Thur.  Today we talked about Stella's need to place herself as a priority, that this process is not so much about weight loss as it is about learning to take care of herself.    Progress Towards Goal(s):  In progress.   Nutritional Diagnosis:  Some progress on disordered eating pattern (NB-1.5) as related to compulsive eating as evidenced by yesterday's evening snack of tuna and flavored water.  No progress on physical inactivity (NB-2.1) as related to back pain as evidenced by no current exercise yet.    Intervention:  Nutrition counseling.   Monitoring/Evaluation:  Dietary intake in 2 weeks.

## 2010-10-18 ENCOUNTER — Ambulatory Visit (INDEPENDENT_AMBULATORY_CARE_PROVIDER_SITE_OTHER): Payer: Self-pay | Admitting: *Deleted

## 2010-10-18 ENCOUNTER — Ambulatory Visit: Payer: Self-pay

## 2010-10-18 DIAGNOSIS — L03119 Cellulitis of unspecified part of limb: Secondary | ICD-10-CM

## 2010-10-18 DIAGNOSIS — L02419 Cutaneous abscess of limb, unspecified: Secondary | ICD-10-CM

## 2010-10-18 DIAGNOSIS — L03115 Cellulitis of right lower limb: Secondary | ICD-10-CM

## 2010-10-18 NOTE — Progress Notes (Signed)
Patient in for suture removal to ulcer on right leg.  Wound is open with sutures in place.  There is much redness and swelling to leg around ulcer. Patient states leg is painful . She only started antibiotic that she was given last week,  yesterday . Dr. Erin Hearing consulted and he advises patient needs to be seen by MD today.

## 2010-10-18 NOTE — Assessment & Plan Note (Signed)
Patient had run out of metformin and just got it filled yesterday.  She reports high blood sugars since running out, which likely contributed to infection.

## 2010-10-18 NOTE — Assessment & Plan Note (Signed)
Pt has chronic venous stasis changes bilaterally, but right leg is much more erythematous, warm, and painful, indicating infection.

## 2010-10-18 NOTE — Assessment & Plan Note (Signed)
Patient with pain and erythema, and open ulcer that was sutured in ED.  No signs of systemic infection. Patient was Rx clindamycin a week ago for the cellulitis and LE ulcer, but did not get it filled today.  Advised completing the course of antibiotics.

## 2010-10-18 NOTE — Patient Instructions (Addendum)
  It was good to see you. Please keep a dressing on your leg wound until you see Dr. Megan Salon next month. It may take several weeks to heal. If the wound becomes more painful, the area of redness gets bigger, or you start having fevers or chills, please contact the office and ask to be seen. Please take the clindamycin (antibiotic) as prescribed. You should also keep your leg elevated as much as possible. It is also important to keep your blood sugar under good control because this will help the healing. If you blood sugars in the morning are usually above 200, please call the office and ask to be seen.

## 2010-10-20 NOTE — Progress Notes (Signed)
  Subjective:    Patient ID: [name], female    DOB: [birth year], 55 y.o.   MRN: [id number]  HPI  Patient presented to nurse's clinic to have sutures removed from her leg wound.  The patient says that she has had skin changes on her legs for a long time, but  A few weeks ago noticed an open sore.  About one week ago, she says she was cleaning the sore with hydrogen peroxide, and it began bleeding profusely.  She says she could not get the bleeding to stop and her kitchen "looked like a murder scene from a movie."  She called EMS and was taken to the ED, where they put stitches in.   The patient reports that she had been off her metformin for a few weeks because she ran out, and her blood sugars had been high, fastings above 200.  She says she was also prescribed an antibiotic for her leg,  But she could not get that filled til yesterday either.    Review of Systems  Constitutional: Negative for fever and chills.  HENT: Negative for congestion.   Eyes: Negative for visual disturbance.  Respiratory: Negative for cough and shortness of breath.   Cardiovascular: Positive for leg swelling. Negative for chest pain.  Gastrointestinal: Negative for nausea and vomiting.  Genitourinary: Negative for dysuria.  Musculoskeletal: Positive for arthralgias.  Skin: Positive for color change and wound.  Neurological: Negative for headaches.       Objective:   Physical Exam  Vitals reviewed. Cardiovascular: Intact distal pulses.   Musculoskeletal:       Patient's lower right leg has a 1.5 x 1 cm open wound with several sutures.  There is an area or erythema around the wound.  Patient has trace edema.  She has bilateral chronic venous stasis changes in her leg, but the right leg appears to have a cellulitis, around the ulcer.    Skin: Skin is warm. There is erythema.          Assessment & Plan:

## 2010-10-23 ENCOUNTER — Telehealth: Payer: Self-pay | Admitting: Family Medicine

## 2010-10-23 NOTE — Telephone Encounter (Signed)
Pt was started on abx last wed by Dr. Barbra Sarks, says she had to stop on Sat b/c it caused severe diarrhea. Pt now feels like she has come down with something, wants to know if MD wants to prescribe something else or should she be seen again?

## 2010-10-23 NOTE — Telephone Encounter (Signed)
Had 3 days of the antibiotic then stopped due to diarrhea. Bled last night. Large scab now. Used teflon bandage. Uses health dept for meds. askes that we send something else in. Per Dr. Erin Hearing will need to be seen. Pt will make appt for tomorrow

## 2010-10-24 ENCOUNTER — Ambulatory Visit (INDEPENDENT_AMBULATORY_CARE_PROVIDER_SITE_OTHER): Payer: Self-pay | Admitting: Family Medicine

## 2010-10-24 ENCOUNTER — Emergency Department (HOSPITAL_COMMUNITY)
Admission: EM | Admit: 2010-10-24 | Discharge: 2010-10-24 | Disposition: A | Discharge status: Home / Self Care | Payer: Self-pay | Attending: Emergency Medicine | Admitting: Emergency Medicine

## 2010-10-24 ENCOUNTER — Encounter: Payer: Self-pay | Admitting: Family Medicine

## 2010-10-24 VITALS — BP 132/80 | HR 80 | Temp 98.2°F | Ht 69.0 in | Wt 321.1 lb

## 2010-10-24 DIAGNOSIS — L03119 Cellulitis of unspecified part of limb: Secondary | ICD-10-CM

## 2010-10-24 DIAGNOSIS — L02419 Cutaneous abscess of limb, unspecified: Secondary | ICD-10-CM

## 2010-10-24 DIAGNOSIS — L03115 Cellulitis of right lower limb: Secondary | ICD-10-CM

## 2010-10-24 DIAGNOSIS — I10 Essential (primary) hypertension: Secondary | ICD-10-CM | POA: Insufficient documentation

## 2010-10-24 DIAGNOSIS — E119 Type 2 diabetes mellitus without complications: Secondary | ICD-10-CM | POA: Insufficient documentation

## 2010-10-24 DIAGNOSIS — Z0000 Encounter for general adult medical examination without abnormal findings: Secondary | ICD-10-CM | POA: Insufficient documentation

## 2010-10-24 NOTE — Assessment & Plan Note (Signed)
Cellulitis appears to be much improved, will not restart any antibiotics at this time.  Advised patient to continue to monitor closely.  Plan to follow-up in one week for recheck tomake sure it is continuing to heal.  Given red flags for signs of infection to return sooner.

## 2010-10-24 NOTE — Progress Notes (Signed)
  Subjective:    Patient ID: [name], female    DOB: [birth year], 55 y.o.   MRN: [id number]  HPI Here for follow-up of right lower leg cellulitis April 18th: went to ER for LE wound, states wound not stop bleeding. History of chornic LE venous stasis.  XR shows no osetomyletis.  Had sutures placed. April 19th:  Seen by Dr. Megan Salon who started clindamycin April 25th:  Patient picked up clindamycin and started April 26th:  Patient seen in clinic, sutured removed, advised to continue taking clindamycin  Patient states she was only able to take 3 days worth due to diarrhea.  Today states she a sore throat.  No fevr, chills, cough, dyspnea.   Review of Systems See hpi    Objective:   Physical Exam  Constitutional: She appears well-developed and well-nourished.  HENT:  Head: Normocephalic and atraumatic.  Right Ear: External ear normal.  Left Ear: External ear normal.  Mouth/Throat: Oropharynx is clear and moist. No oropharyngeal exudate.  Eyes: Conjunctivae are normal. Pupils are equal, round, and reactive to light.  Neck: Neck supple.  Cardiovascular: Normal rate and regular rhythm.   Pulmonary/Chest: Effort normal and breath sounds normal. No respiratory distress. She has no wheezes. She has no rales.  Abdominal: Soft.  Skin:       Right LE with some chronic venous stasis changes.  Has overlying eschar.  Minimal surround erythema.  Tender to palpation.  No fluctuance or drainage.  psoriatic plaques noted on left shin.        Assessment & Plan:

## 2010-10-24 NOTE — Patient Instructions (Signed)
Drink lots of water to stay well hydrated Follow-up in 1 week with Dr. Megan Salon  Let us know sooner if you notice increased redness, pus drainage, fevers, or other concerns

## 2010-10-25 ENCOUNTER — Encounter: Payer: Self-pay | Admitting: Family Medicine

## 2010-10-25 ENCOUNTER — Ambulatory Visit (INDEPENDENT_AMBULATORY_CARE_PROVIDER_SITE_OTHER): Payer: Self-pay | Admitting: Family Medicine

## 2010-10-25 VITALS — BP 114/74 | HR 79 | Temp 98.2°F | Wt 319.0 lb

## 2010-10-25 DIAGNOSIS — L03115 Cellulitis of right lower limb: Secondary | ICD-10-CM

## 2010-10-25 DIAGNOSIS — L02419 Cutaneous abscess of limb, unspecified: Secondary | ICD-10-CM

## 2010-10-25 MED ORDER — TRAMADOL HCL 50 MG PO TABS: 50.0000 mg | ORAL_TABLET | Freq: Two times a day (BID) | ORAL | Status: DC | PRN

## 2010-10-25 NOTE — Patient Instructions (Signed)
Elevate your foot. Take Tramadol as needed as directed for pain. Follow up with Dr. Megan Salon on Monday I hope you feel better!  - Dr. Sherilyn Cooter

## 2010-10-26 NOTE — Assessment & Plan Note (Addendum)
Cellulitis appears to be much improved, will not restart any antibiotics at this time. Bleeding of wound was likely secondary to unknown trauma with peripheral neuropathy. Advised patient to continue to monitor closely.  Plan to follow-up with Dr. Megan Salon as previously scheduled. Given red flags for signs of infection to return sooner. Short course of Tramadol for pain - refill at PCP discretion.

## 2010-10-26 NOTE — Progress Notes (Signed)
  Subjective:    Patient ID: [name], female    DOB: [birth year], 55 y.o.   MRN: [id number]  HPI  Here for follow-up of right lower leg cellulitis April 18th: went to ER for LE wound, states wound not stop bleeding. History of chornic LE venous stasis.  XR shows no osetomyletis.  Had sutures placed. April 19th:  Seen by Dr. Megan Salon who started clindamycin April 25th:  Patient picked up clindamycin and started April 26th:  Patient seen in clinic, sutured removed, advised to continue taking clindamycin April 29th: Stopped taking clindamycin secondary to diarrhea May 2nd: Seen by Dr. Doreene Nest - cellulitis was much improved, patient was advised on wound care and antibiotics were not restarted May 2nd: Wound started bleeding while patient was at [facility]. Patient went to ER - no antibiotics were started, wound was cleaned and re-bandaged  Denies fever, diarrhea currently, nausea, emesis, sore elsewhere, drainage from wound. Does not recall if she hit her leg   Pertinent past medical history reviewed   Review of Systems As per HPI     Objective:   Physical Exam  Constitutional: She appears well-developed and well-nourished.  HENT:  Head: Normocephalic and atraumatic.  Right Ear: External ear normal.  Left Ear: External ear normal.  Mouth/Throat: Oropharynx is clear and moist. No oropharyngeal exudate.  Eyes: Conjunctivae are normal. Pupils are equal, round, and reactive to light.  Neck: Neck supple.  Cardiovascular: Normal rate and regular rhythm.  1+ dorsalis pedis pulses Pulmonary/Chest: Effort normal and breath sounds normal. No respiratory distress. She has no wheezes. She has no rales.  Abdominal: Soft.  Skin:       Right LE with some chronic venous stasis changes.  Has overlying eschar.  Minimal surround erythema.  Tender to palpation.  No fluctuance or drainage.  Extremities: Decreased sensation bilateral lower extremities from soles of feet to mid shins        Assessment & Plan:

## 2010-10-29 ENCOUNTER — Encounter: Payer: Self-pay | Admitting: Family Medicine

## 2010-10-29 ENCOUNTER — Ambulatory Visit (INDEPENDENT_AMBULATORY_CARE_PROVIDER_SITE_OTHER): Payer: Self-pay | Admitting: Family Medicine

## 2010-10-29 DIAGNOSIS — I872 Venous insufficiency (chronic) (peripheral): Secondary | ICD-10-CM

## 2010-10-29 DIAGNOSIS — E1149 Type 2 diabetes mellitus with other diabetic neurological complication: Secondary | ICD-10-CM

## 2010-10-29 DIAGNOSIS — I10 Essential (primary) hypertension: Secondary | ICD-10-CM

## 2010-10-29 LAB — COMPREHENSIVE METABOLIC PANEL
ALT: 20 U/L (ref 0–35)
AST: 23 U/L (ref 0–37)
BUN: 18 mg/dL (ref 6–23)
Calcium: 9.8 mg/dL (ref 8.4–10.5)
Creat: 0.99 mg/dL (ref 0.40–1.20)
Total Bilirubin: 0.3 mg/dL (ref 0.3–1.2)

## 2010-10-29 NOTE — Patient Instructions (Signed)
It was a pleasure to care for you today.   Please make a follow up appointment in 2 weeks to follow up LE ulcer and DM.  Return if with any fever, chills, increased redness, swelling, discharge, or any other concerning symptoms.   Continue taking medication as instructed.

## 2010-10-29 NOTE — Progress Notes (Signed)
Subjective:    [name] is a 55 y.o. female who presents for evaluation of a possible skin infection located Right lower extremity.  Pt recently seen by multiple providers and s/p suture removal and antibiotics. Symptoms include moderate pain and erythema located right lower extremity. Patient denies chills and fever greater than 100. Precipitating event: break in the skin. Treatment to date has included elevation of the area, antibiotics started 2 weeks ago and wound care with moderate relief.  2. Pt also followed for her DM.  She states she has recently  The following portions of the patient's history were reviewed and updated as appropriate: allergies, current medications, past family history, past medical history, past social history, past surgical history and problem list.  Review of Systems Pertinent items are noted in HPI.     Objective:    BP 138/74  Pulse 86  Temp(Src) 98.3 F (36.8 C) (Oral)  Ht 5' 9"  (1.753 m)  Wt 320 lb 6.4 oz (145.332 kg)  BMI 47.31 kg/m2 General appearance: alert and cooperative Lungs: clear to auscultation bilaterally Heart: regular rate and rhythm, S1, S2 normal, no murmur, click, rub or gallop Extremities: venous stasis dermatitis noted and Right lower extremity with chronic venous changes along with superimposed ulcer with minimal drainage, +surrounding erythema with some tenderness to palpation.  appears better healed.  Skin: see above LE findings     Assessment:    Cellulitis of the Right lower extremity.  DM II with complications Hypertension   Plan:    Wound dressing applied. Follow up in 2 weeks for wound check.   2. DM II with HTN-check CMP because she is on ACE1 and K sparing diuretic.  Edema improved per patient report.   3. Venous insufficiency-chronic venous dermatitis s/p vein stripping.  Will check venous ultrasound.  And since pt also c/o LE pain and has risk factors for arterial insufficiency will add arterial ultrasound as  well.

## 2010-10-31 ENCOUNTER — Encounter: Payer: Self-pay | Admitting: Family Medicine

## 2010-10-31 ENCOUNTER — Ambulatory Visit (INDEPENDENT_AMBULATORY_CARE_PROVIDER_SITE_OTHER): Payer: Self-pay | Admitting: Family Medicine

## 2010-10-31 DIAGNOSIS — E11622 Type 2 diabetes mellitus with other skin ulcer: Secondary | ICD-10-CM | POA: Insufficient documentation

## 2010-10-31 DIAGNOSIS — E1169 Type 2 diabetes mellitus with other specified complication: Secondary | ICD-10-CM

## 2010-10-31 DIAGNOSIS — L97909 Non-pressure chronic ulcer of unspecified part of unspecified lower leg with unspecified severity: Secondary | ICD-10-CM

## 2010-10-31 NOTE — Assessment & Plan Note (Addendum)
Precepted with Dr. Andria Frames.  Overall appears to be healing.  There are a couple superficial veins that are likely the source of significant bleeding.  Used silver nitrate sticks to cauterize a couple of the superficial veins do hopefully help with the bleeding.  Advised her to continue using the gauze and ACE bandage.  No need for antibiotics at this point.  Precautions given.  Close follow up.  If bleeding persist would work on getting her with either the Vascular surgeons or wound care.

## 2010-10-31 NOTE — Progress Notes (Signed)
  Subjective:    Patient ID: [name], female    DOB: Jun 06, 1956, 55 y.o.   MRN: [id number]  HPI 1. Right shin ulcer:  She has had this ulcer for the past month.  She has seen multiple different providers in the office and has been to the ED twice.  The ulcer has continued to bleed.  She has had two episodes in the past week where it was bleeding profusely.  One required pressure in the ED.  She is concerned about changing the covering because she doesn't want it to start bleeding again.  It is still bleeding some and has soaked through the gauze and ACE bandage.  PMHx: Hx of vein stripping of that leg about 30 years ago.  Has been to Vascular surgery and had it sclerosed but hasn't been able to go back there because she lost her insurance.   Review of Systems Denies purulent material coming from the leg.  Denies redness, swelling, or warmth.  Denies light-headedness, chest pain, or shortness of breath    Objective:   Physical Exam  Constitutional: She appears well-developed and well-nourished.  HENT:  Head: Normocephalic and atraumatic.  Right Ear: External ear normal.  Left Ear: External ear normal.  Mouth/Throat: Oropharynx is clear and moist. No oropharyngeal exudate.  Eyes: Conjunctivae are normal. Pupils are equal, round, and reactive to light.  Neck: Neck supple.  Cardiovascular: Normal rate and regular rhythm.   Pulmonary/Chest: Effort normal and breath sounds normal. No respiratory distress. She has no wheezes. She has no rales.  Abdominal: Soft.  Skin:       Right LE with some chronic venous stasis changes.  Has about a 1.5x1.5 cm ulcer on the anterior shin.  There is no active bleeding.  There does appear to be multiple superficial veins near the surface that could begin bleeding.  No signs of secondary infection.  Minimal surround erythema.  Tender to palpation.  No fluctuance or drainage.          Assessment & Plan:

## 2010-11-06 ENCOUNTER — Encounter: Payer: Self-pay | Admitting: Family Medicine

## 2010-11-06 ENCOUNTER — Ambulatory Visit (HOSPITAL_COMMUNITY)
Admission: RE | Admit: 2010-11-06 | Discharge: 2010-11-06 | Disposition: A | Discharge status: Home / Self Care | Payer: Self-pay | Source: Ambulatory Visit | Attending: Family Medicine | Admitting: Family Medicine

## 2010-11-06 ENCOUNTER — Ambulatory Visit (INDEPENDENT_AMBULATORY_CARE_PROVIDER_SITE_OTHER): Payer: Self-pay | Admitting: Family Medicine

## 2010-11-06 ENCOUNTER — Ambulatory Visit: Payer: Self-pay | Admitting: Family Medicine

## 2010-11-06 DIAGNOSIS — E1169 Type 2 diabetes mellitus with other specified complication: Secondary | ICD-10-CM

## 2010-11-06 DIAGNOSIS — M7989 Other specified soft tissue disorders: Secondary | ICD-10-CM | POA: Insufficient documentation

## 2010-11-06 DIAGNOSIS — L97909 Non-pressure chronic ulcer of unspecified part of unspecified lower leg with unspecified severity: Secondary | ICD-10-CM

## 2010-11-06 DIAGNOSIS — E1149 Type 2 diabetes mellitus with other diabetic neurological complication: Secondary | ICD-10-CM

## 2010-11-06 DIAGNOSIS — E11622 Type 2 diabetes mellitus with other skin ulcer: Secondary | ICD-10-CM

## 2010-11-06 DIAGNOSIS — M79609 Pain in unspecified limb: Secondary | ICD-10-CM | POA: Insufficient documentation

## 2010-11-06 DIAGNOSIS — G629 Polyneuropathy, unspecified: Secondary | ICD-10-CM

## 2010-11-06 DIAGNOSIS — G609 Hereditary and idiopathic neuropathy, unspecified: Secondary | ICD-10-CM

## 2010-11-06 MED ORDER — TRAMADOL HCL (ER BIPHASIC) 100 MG PO CP24: 1.0000 | ORAL_CAPSULE | Freq: Two times a day (BID) | ORAL | Status: DC | PRN

## 2010-11-06 MED ORDER — GABAPENTIN 300 MG PO CAPS: ORAL_CAPSULE | ORAL | Status: DC

## 2010-11-06 MED ORDER — PREGABALIN 75 MG PO CAPS: ORAL_CAPSULE | ORAL | Status: DC

## 2010-11-06 NOTE — Discharge Summary (Signed)
NAMESANDE, [name]                 ACCOUNT NO.:  [id number]   MEDICAL RECORD NO.:  [id number]          PATIENT TYPE:  INP   LOCATION:  5032                         FACILITY:  [facility]   PHYSICIAN:  [name] Knuckles. Norins, MD  DATE OF BIRTH:  1955/10/20   DATE OF ADMISSION:  10/06/2007  DATE OF DISCHARGE:  10/09/2007                               DISCHARGE SUMMARY   ADMITTING DIAGNOSIS:  Boil on the right forearm with cellulitis.   DISCHARGE DIAGNOSIS:  Boil on the right forearm with cellulitis.   CONSULTANTS:  [name] Ape. Rise Patience, MD for Surgery.   PROCEDURES:  Incision and drainage at the bedside.   HISTORY OF PRESENT ILLNESS:  The patient is a 55 year old diabetic  patient of [name] who presented to the office on the day of  admission for a painful right forearm.  She had a lesion on her forearm  for several months.  She admits that she would pick at this lesion.  Over the past 7-10 days prior to admission, she developed a red,  swollen, draining wound on her right forearm with surrounding erythema.  On examination in the office, she had significant erythema, calor from  the wrist to the elbow with a 3 cm furuncle with an open center.  She  also had a tenderness in the axilla with a question of adenopathy.  She  was admitted to the hospital for IV vancomycin for possible community-  acquired MRSA infection.   Please see the office generated H&P and Dr. Cheral Bay consult note for  details of past medical history, family history, social history and  exam.   HOSPITAL COURSE:  The patient was admitted to the hospital.  Wound  culture was obtained.  This grew out abundant staph aureus.  Final  sensitivities were pansensitive except penicillin.   The patient was seen in consultation by Dr. Rise [name].  He performed  incision and drainage of the bedside.  Tissue culture from that  procedure were pending at this dictation.   The patient did well over several days in hospital  with improvement and  the erythema and tenderness about her forearm.  The patient's laboratory  remained stable during her hospitalization.   With the patient having had incision and drainage.  With sensitivities  regards for staph aureus returned showing sensitivity to oral  medications.  She is now for discharge home.   DISCHARGE EXAMINATION:  GENERAL:  The patient is awake, alert, she is in  the bed.  This is an overweight woman in no distress.  VITALS:  Temperature was 98.3, blood pressure 132/74, pulse 79,  respirations 20, O2 sats 96% on room air.  CHEST:  The patient is moving air well.  No wheezes were audible.  CARDIOVASCULAR:  The patient had a regular rate and rhythm.  DERM:  The patient has got a dressing over the right forearm.  This was  gently peeled back and it revealed that the surrounding erythema was  markedly reduced.  There was no streaking from the wound.  There is no  tenderness along the inner aspect of  the proximal upper extremity.   FINAL LABORATORY:  Wound culture with abundant staph aureus was  pansensitive except to penicillin.  Final CBC was done at admission  actually with a hemoglobin 12.4 grams and a white count at 10,000.  Chemistries at admission were unremarkable except for a glucose of 288.  No additional labs were drawn.   DISCHARGE MEDICATIONS:  The patient will resume all of her home  medications.  She will also take doxycycline 100 mg p.o. b.i.d.  We will  obtain wound care instructions from the Surgical Service for the patient  to follow at home.  She will be asked to see Dr. Rise [name] on Monday or  Tuesday in the office.  She will see [name] in 7-10 days for  followup.   The patient's condition at the time of discharge dictation is stable and  improved.      [name] Knuckles Norins, MD  Electronically Signed     MEN/MEDQ  D:  10/09/2007  T:  10/09/2007  Job:  [id number]   cc:   Hilliard Clark A. Loanne Drilling, MD  [place] Rise [name], M.D.

## 2010-11-06 NOTE — Patient Instructions (Signed)
The ulcer is healing well.  Continue to keep it clean. For the neuropathy we will switch from Neurontin to Lyrica.  Taper up the Lyrica and taper down the Neurontin as directed You can also take Tramadol as needed. Please schedule a follow up appointment in 4 weeks

## 2010-11-06 NOTE — Progress Notes (Signed)
  Subjective:    Patient ID: [name], female    DOB: 1956/03/15, 55 y.o.   MRN: [id number]  HPI  1. Right shin ulcer:    Last visit:  She has had this ulcer for the past month.  She has seen multiple different providers in the office and has been to the ED twice.  The ulcer has continued to bleed.  She has had two episodes in the past week where it was bleeding profusely.  One required pressure in the ED.  She is concerned about changing the covering because she doesn't want it to start bleeding again.  It is still bleeding some and has soaked through the gauze and ACE bandage.  This visit: The ulcer is healing well.  It is still draining a little bit but she hasn't had any more bleeding.  She is having some sharp pains in that area now  PMHx: Hx of vein stripping of that leg about 30 years ago.  Has been to Vascular surgery and had it sclerosed but hasn't been able to go back there because she lost her insurance.  2. Peripheral neuropathy:  It is getting worse.  She is on Neurontin and Ultram and it is not controlling it.  She would like to switch to something stronger.  Described as "putting her feet in snow".  It does keep her up at night.   Review of Systems  Denies purulent material coming from the leg.  Denies redness, swelling, or warmth.  Denies light-headedness, chest pain, or shortness of breath    Objective:   Physical Exam  Constitutional: She appears well-developed and well-nourished.  HENT:  Head: Normocephalic and atraumatic.  Right Ear: External ear normal.  Left Ear: External ear normal.  Mouth/Throat: Oropharynx is clear and moist. No oropharyngeal exudate.  Eyes: Conjunctivae are normal. Pupils are equal, round, and reactive to light.  Neck: Neck supple.  Cardiovascular: Normal rate and regular rhythm.   Pulmonary/Chest: Effort normal and breath sounds normal. No respiratory distress. She has no wheezes. She has no rales.  Abdominal: Soft.  Neurological:    Decreased sensation bilateral feet  Skin:       Right LE with some chronic venous stasis changes.  Has about a 1.5x1.5 cm ulcer on the anterior shin that is more healed compared to last time.  There is no active bleeding.  There does appear to be multiple superficial veins near the surface that could begin bleeding.  No signs of secondary infection.  Minimal surrounding erythema.  Tender to palpation.  No fluctuance or drainage.          Assessment & Plan:

## 2010-11-06 NOTE — Procedures (Signed)
[name], [name]                 ACCOUNT NO.:  [id number]   MEDICAL RECORD NO.:  [id number]          PATIENT TYPE:  OUT   LOCATION:  SLEEP CENTER                 FACILITY:  [facility]   PHYSICIAN:  Kathee Delton, MD,FCCPDATE OF BIRTH:  Dec 04, 1955   DATE OF STUDY:  04/06/2007                            NOCTURNAL POLYSOMNOGRAM   REFERRING PHYSICIAN:  Kathee Delton, MD,FCCP   INDICATION FOR STUDY:  Hypersomnia with sleep apnea.   EPWORTH SLEEPINESS SCORE:  8.   SLEEP ARCHITECTURE:  The patient had a total sleep time of 372 minutes  with very little slow wave sleep or REM.  Sleep onset latency was normal  at 20 minutes, and REM onset was very prolonged at 247 minutes.  Sleep  efficiency was decreased at 89%.   RESPIRATORY DATA:  The patient was found to have 232 obstructive  hypopneas and 21 obstructive apneas. This gave the patient an  apnea/hypopnea index of 41 events per hour.  The events were not  positional, and there was very loud snoring noted throughout.   OXYGEN DATA:  There was O2 desaturation as low as 76% with the patient's  obstructive events.   CARDIAC DATA:  Occasional PVCs and PACs were noted throughout.   MOVEMENT-PARASOMNIA:  The patient was found to have 29 leg jerks with  very little sleep disruption.   IMPRESSIONS-RECOMMENDATIONS:  1. Severe obstructive sleep apnea/hypopnea syndrome with an      apnea/hypopnea index of 41 events per hour and O2 desaturation as      low as 76%.  Treatment for this degree of sleep apnea can include      weight loss, upper airway surgery, oral appliance, as well as CPAP.      However, CPAP with weight loss offers the patient her best chance      of resolution of her obstructive sleep apnea.  2. Occasional PVCs and PACs but no overly significant cardiac      arrhythmias were noted.      Kathee Delton, MD,FCCP  [place], [place] Board of Sleep  Medicine  Electronically Signed    KMC/MEDQ  D:  04/14/2007 06:48:03   T:  04/14/2007 15:09:22  Job:  [id number]

## 2010-11-06 NOTE — Op Note (Signed)
NAMEHARLEY, [name]                 ACCOUNT NO.:  [id number]   MEDICAL RECORD NO.:  [id number]          PATIENT TYPE:  INP   LOCATION:  5032                         FACILITY:  [facility]   PHYSICIAN:  Orson Ape. Weatherly, M.D.DATE OF BIRTH:  1955/09/13   DATE OF PROCEDURE:  10/07/2007  DATE OF DISCHARGE:                               OPERATIVE REPORT   ,   PREOPERATIVE DIAGNOSES:  Abscess, right forearm.   OPERATION:  Debridement and drainage of abscess right forearm.   ANESTHESIA:  Local anesthesia at bedside.   HISTORY:  [name] is a 55 year old female who is admitted by Dr.  [name] Hedges and associates with an abscess on the right forearm of about, she  says, 3 weeks' duration.  Over the last 3 days, it has markedly  increased and she was seen by Dr Loanne Drilling  and placed on, as she is a  diabetic, Lantus insulin.  She said that she had had low lesion on the  forearm for several months which she would pick and then over the 3 days  this has been increased with erythema may approximately about the size  of 3 inches or so.  The area looks as if it is possibly of staph MRSA  infection I think.  Gram-positive bacteria has been seen on the culture  that was done from Dr. [name] Hedges' office and I was asked to see the  patient, and on examination, there is obviously an acute inflammatory  process with drainage and multiple little opens that have been  made by  squeezing, etc.  She has already been started on vancomycin and  I would  recommend that we go ahead and just excise this area and sent it for  path exam and also do repeat cultures and most likely we will see a kind  of a chronic abscess.  The other possibility could be a little sebaceous  cyst or possibly an insect bite.  I think it is very unlikely that this  likely to be a tumor.  The patient agreed to do this at bedside and we  prepped the area, then made a little wheel of 1% Xylocaine plain in the  central area that is about 2 cm in  size and very little bleeding was  controlled with a 4 x4s.  The underlying muscles were found to be  inflamed, but not really on actual abscess.  We looked the pockets of  infection for gonorrhea and the subcutaneous tissues grown out towards  the axial elbow __________ .  We cultured the area and then placed a  little Betadine solution with an 4x4s and a light dressing over it.  We  will continue with the arm elevated, the IV antibiotics and await for  the results of the cultures and I am also going sent it for pathology  exam.  The patient is in agreement with this plan and hopefully the  inflammation will now start decreasing, and after we identify the  organism, probably we can continue on oral antibiotics and follow up in  the office in approximately 4 to  5 days.           ______________________________  Orson Ape. Rise Patience, M.D.     WJW/MEDQ  D:  10/07/2007  T:  10/08/2007  Job:  [id number]   cc:   Heinz Knuckles. Norins, MD

## 2010-11-06 NOTE — Consult Note (Signed)
[name], [name]                 ACCOUNT NO.:  [id number]   MEDICAL RECORD NO.:  [id number]          PATIENT TYPE:  INP   LOCATION:  5032                         FACILITY:  [facility]   PHYSICIAN:  Orson Ape. Weatherly, M.D.DATE OF BIRTH:  [birth year]   DATE OF CONSULTATION:  10/07/2007  DATE OF DISCHARGE:                                 CONSULTATION   REFERRING PHYSICIAN:  Heinz Knuckles. Norins, M.D.   REASON FOR CONSULTATION:  Cellulitis/abscess of the right forearm.   HISTORY OF PRESENT ILLNESS:  This is a 55 year old white female who  began getting a bump on her arm approximately three weeks ago.  She  states that she is a picker and began to pick this bump.  She states  that it began to get worse and over the next several weeks began to  increase in size and increase in pain.  She states that by the beginning  of this week, the pain was getting quite bad and at that time requested  to see Dr. [name] Hedges.  After his evaluation of her, he felt that she needed  to be admitted to the hospital for IV antibiotics as well as possible  I&D.  Once the patient was admitted, she was placed on IV Vancomycin and  preliminary cultures showed gram positive cocci in pairs and clusters.  At this time her white blood cell count is normal at 10,000.  She is  currently having some minimal purulent and serosanguineous drainage.  At  this time, Dr. [name] Hedges felt that this area probably needed to be I&D'd  and therefore we were consulted.   REVIEW OF SYSTEMS:  See HPI.  Otherwise all other systems are negative.   FAMILY HISTORY:  Noncontributory.   PAST MEDICAL HISTORY:  1. Obstructive sleep apnea.  2. Dyslipidemia.  3. Pernicious anemia.  4. Diabetes.  5. Hypertension.   PAST SURGICAL HISTORY:  1. She had an arthroscopy of her right knee in 2003 and she had vein      stripping of her right leg.  2. Tonsillectomy.   SOCIAL HISTORY:  The patient has been married since 21.  She has one  son and one  daughter.  She works as a host at [employer].   ALLERGIES:  1. KEFLEX.  2. NEOSPORIN.  3. PLASTIC TAPE.   HOME MEDICATIONS:  1. Simvastatin 80 mg daily.  2. Lisinopril 20/120 two tablets daily.  3. Aspirin 81 mg daily.  4. Effexor 75 mg three tablets at bedtime.  5. Furosemide 20 mg daily.  6. Lantus 200 units subcu daily at bedtime.   PHYSICAL EXAMINATION:  GENERAL:  This is a pleasant 55 year old white  female who is obese and sitting up in bed in no acute distress.  VITAL SIGNS:  Temperature 97.6, pulse 75, respirations 20, blood  pressure 150/75.  HEENT:  Eyes; sclerae not injected.  Pupils equal, round, and reactive  to light.  Ears, nose, mouth, and throat with no obvious masses.  NECK:  Supple.  Trachea is midline.  No thyromegaly.  LUNGS:  Clear to auscultation bilaterally with no wheezes,  rhonchi, or  rales noted.  Respiratory effort is normal.  HEART:  Regular rate and rhythm.  Normal S1 and S2 with no murmurs,  rubs, or gallops.  ABDOMEN:  Soft, nontender, and nondistended with positive bowel sounds  and obese.  No obvious masses or hernias.  MUSCULOSKELETAL:  All four extremities are symmetrical with no cyanosis,  clubbing, or edema.  SKIN:  Unremarkable except for a right forearm abscess that measures 3.5  x 3.5 cm of induration.  There is an open area that has erupted  producing some purulent and serosanguineous drainage.  There is an area  of erythema that measures approximately 5 x 5 cm surrounding the  abscess.  The patient's arm is quite tender just proximal to her elbow  that proceeds down into her mid forearm region.   LABORATORY DATA:  Labs that were done on April 14 show a white blood  cell count of 10,000, hemoglobin 12.4, hematocrit 36.2, platelets  291,000.  Sodium 133, potassium 4.3, BUN 10, creatinine 0.92, glucose  288.  Preliminary wound culture shows gram positive cocci in pairs and  clusters.  No diagnostics have been done.   IMPRESSION:  1.  Right forearm cellulitis/abscess.  2. Diabetes.  3. Dyslipidemia.  4. Hypertension.  5. Obstructive sleep apnea.   PLAN:  At this time, I will have an I&D tray brought up to the room for  possible bedside I&D.  If Dr. Rise Patience feels that this can be done at  the bedside, then we will proceed with a bedside I&D, however, if he  feels that this is too large or the patient will be unable to get  adequate pain control during the procedure, then the patient may need to  go to the OR to have this done.  Therefore in preparation for the latter  possibility, I will make the patient NPO for right now.  If we are able  to do the bedside I&D then I will resume the patient's current diet  after the procedure.  Any further recommendations will be per Dr.  Rise Patience after his evaluation of the patient.      Henreitta Cea, PA    ______________________________  Orson Ape. Rise Patience, M.D.   KEO/MEDQ  D:  10/07/2007  T:  10/07/2007  Job:  [id number]   cc:   Heinz Knuckles. Norins, MD

## 2010-11-06 NOTE — Op Note (Signed)
[name], [name]                 ACCOUNT NO.:  [id number]   MEDICAL RECORD NO.:  [id number]          PATIENT TYPE:  AMB   LOCATION:  DAY                          FACILITY:  [facility]   PHYSICIAN:  Ronald A. Gioffre, M.D.DATE OF BIRTH:  Nov 10, 1955   DATE OF PROCEDURE:  12/10/2007  DATE OF DISCHARGE:                               OPERATIVE REPORT   SURGEON:  Kipp Brood. Gladstone Lighter, M.D.   ASSISTANT:  Evert Kohl, P.A.   PREOPERATIVE DIAGNOSES:  1. Complete retracted tear of the right rotator cuff tendon.  2. Severe impingement syndrome right shoulder.   POSTOPERATIVE DIAGNOSES:  1. Complete retracted tear of the right rotator cuff tendon.  2. Severe impingement syndrome right shoulder.   OPERATION:  1. Open acromionectomy and acromioplasty right shoulder.  2. Repair of a complete retracted tear tendon of rotator cuff right      shoulder utilizing two PEEK anchors, each had 4 strands, 4 needles      and strands.  The graft used was TissueMend.   PROCEDURE IN DETAIL:  Under general anesthesia routine orthopedic prep  and draping of the right shoulder was carried out.  She was given 1 gram  of IV vancomycin preop because of past history of staph infection in her  skin.  At this time the patient prior to surgery had an interscalene  nerve block.  She was taken back to surgery under general anesthesia.  Routine orthopedic prep and draping of the right shoulder was carried  out.  Incision was made over the anterior aspect of the right shoulder.  Bleeders identified and cauterized.  At that time the incision was  carried down to the acromion.  Self-retaining retractors were advanced.  Following that I stripped the deltoid tendon from the acromion and split  the proximal part of the deltoid muscle.  Once we were down there I  excised the subdeltoid bursa and I noticed the extreme tightness of the  subacromial space.  He had marked overgrowth and downsloping of the  acromion.  Basically we  had a great deal of difficulty getting a finger  between the humeral head and the acromion.  I then inserted a Bennett  retractor to protect the cuff and did a partial acromionectomy with the  oscillating saw then utilized the bur to even out the undersurface of  the acromion.  Following that I then easily was able to identify the  tear.  The rotator cuff was split longitudinally and transversely and  retracted off of the humeral head.  I gently retracted the tendon and  utilized the bur to bur down the lateral articular surface of the  humerus to a layer of good bleeding bone.  I then thoroughly irrigated  out the area and sutured the longitudinal tear from side-to-side with  Ethibond suture #1 Ethibond.  Following that I put two PEEK anchors.  Each anchor had four sutures.  I inserted two those to the proximal  humerus.  I brought the tendon down under tension into the bleeding bed  that we created and then used the suture  anchors to suture down the  rotator cuff.  Once that was done then I utilized a second anchor for  reinforcement by utilizing a TissueMend graft over the graft site repair  site.  I thoroughly irrigated out the area and  closed the deltoid tendon and muscle in the usual fashion.  Subcu was  closed with 3-0 Vicryl, skin was closed with metal staples.  A sterile  Neosporin dressing was applied.  She was then placed in a shoulder  immobilizer.  She left the operating room in satisfactory condition.           ______________________________  Kipp Brood Gladstone Lighter, M.D.     RAG/MEDQ  D:  12/10/2007  T:  12/10/2007  Job:  [id number]   cc:   Hilliard Clark A. Loanne Drilling, [place]  [place] [address]

## 2010-11-06 NOTE — Assessment & Plan Note (Signed)
Overall worse.  Not controlled with Neurontin and Ultram.  She was requesting something stronger.  Will switch to Lyrica and increase the dose of Ultram.  Advised the patient the increased risk of medication interactions with all of her medicines.  She understood her risks and wanted to switch.  She was also asking about "IVIG".  I am not familiar with that treatment but it may be something worth looking into.

## 2010-11-06 NOTE — Assessment & Plan Note (Signed)
[place]                             PULMONARY OFFICE NOTE   TIJA, BISS                          MRN:          [id number]  DATE:03/24/2007                            DOB:          04/09/1956    HISTORY OF PRESENT ILLNESS:  The patient is a 55 year old female who I  have been asked to see for possible sleep apnea.  The patient states  that she recently accompanied her son to the sleep center, and while  staying with him that night the sleep technicians heard her snoring very  loudly and observed apneas through the infrared camera.  The patient  admits to having loud snoring, but denies choking arousals while at  home.  She typically gets to bed between 12 and 1 a.m. and gets up  between 6:30 and 7:30 a.m. to start her day.  She is not rested upon  arising.  The patient works at [employer] and has significant sleep pressure  during the day with periods of inactivity.  She will doze with watching  TV or movies.  She rarely has a problem with driving.  Of note, the  patient's weight is up 10 pounds from 2 years ago.   PAST MEDICAL HISTORY:  1. Hypertension.  2. Diabetes.  3. Dyslipidemia.  4. History of knee surgery.   CURRENT MEDICATIONS:  Not available   The patient is intolerant/allergic to [place] and [place].   SOCIAL HISTORY:  The patient is married and has children.  She has a  history of smoking 1-2 packs per day for 24 years.  She has not smoked  in 12 years.   FAMILY HISTORY:  Remarkable for father having colon cancer as well as  sister.   REVIEW OF SYSTEMS:  As per history of present illness, also see patient  intake form documented on the chart.   PHYSICAL EXAMINATION:  IN GENERAL:  She is an obese female in no acute  distress.  Blood pressure is 118/76, pulse 95, temperature is 98, weight is 310  pounds.  She is 5 feet 9-1/2 inches tall.  O2 saturation on room air is  97%.  HEENT:  Pupils are equal, round, and reactive to  light and  accommodation, extraocular muscles are intact.  Nares are narrowed but  patent.  Oropharynx does show significant elongation of the soft palate  and uvula.  NECK:  Supple without JVD or lymphadenopathy.  No palpable thyromegaly.  CHEST:  Totally clear.  CARDIAC EXAM:  Reveals regular rate and rhythm, no murmurs, rubs, or  gallops.  ABDOMEN:  Soft, nontender, with good bowel sounds.  GENITAL/RECTAL/BREAST EXAM:  Not done and not indicated.  LOWER EXTREMITIES:  Show definite edema on the right with compression  hose in place.   IMPRESSION:  Probable obstructive sleep apnea.  The patient is morbidly  obese, has abnormal upper airway anatomy, and has a history that is  classic for this.  I had a long discussion with her about sleep apnea,  and have recommended a sleep study at this point.  The patient is  in  agreement.   PLAN:  1. Schedule for nocturnal polysomnogram.  2. Work on weight loss.  3. The patient will followup after the above.     Kathee Delton, MD,FCCP  Electronically Signed    [facility]/Med[facility]  DD: 04/03/2007  DT: 04/03/2007  Job #: [id number]   cc:   Hilliard Clark A. Loanne Drilling, MD

## 2010-11-06 NOTE — Assessment & Plan Note (Signed)
Overall healing.  No signs of secondary infection.  Will continue to monitor.  Would consider sending her to a vascular surgeon when ulcer heals.  They may be able to try sclerosing some of the superficial veins.

## 2010-11-07 ENCOUNTER — Other Ambulatory Visit: Payer: Self-pay | Admitting: *Deleted

## 2010-11-07 DIAGNOSIS — E7800 Pure hypercholesterolemia, unspecified: Secondary | ICD-10-CM

## 2010-11-07 MED ORDER — ATORVASTATIN CALCIUM 80 MG PO TABS: 80.0000 mg | ORAL_TABLET | Freq: Every day | ORAL | Status: DC

## 2010-11-09 NOTE — Cardiovascular Report (Signed)
[name], [name]                 ACCOUNT NO.:  [id number]   MEDICAL RECORD NO.:  [id number]          PATIENT TYPE:  OIB   LOCATION:  6501                         FACILITY:  [facility]   PHYSICIAN:  Minus Breeding, M.D.   DATE OF BIRTH:  [birth year]   DATE OF PROCEDURE:  02/15/2005  DATE OF DISCHARGE:                              CARDIAC CATHETERIZATION   PRIMARY CARE PHYSICIAN:  Sean A. Loanne Drilling, M.D.   PROCEDURES:  1.  Left heart catheterization.  2.  Coronary arteriography.   INDICATIONS FOR PROCEDURE:  The patient with chest pain and Cardiolite  suggestive of inferior ischemia.   DESCRIPTION OF PROCEDURE:  Left heart catheterization is performed via the  right femoral artery.  The artery was cannulated using the anterior wall  puncture.  A #6 French arterial sheath was inserted via the modified  Seldinger technique.  Judkins and pigtail catheter was utilized.  The  patient tolerated the procedure well and left the lab in stable condition.   HEMODYNAMICS:  LV 155/22.   The left main had luminal irregularities.  The LAD had a mid 30% stenosis  after the first diagonal.  The first diagonal was large and normal.  The  circumflex in the AV groove was normal.  There was a large branching ramus  intermediate with osteal 30% stenosis.  There was a small first obtuse  marginal.  This was normal.  There were two moderate size posterolaterals  with luminal irregularities.   Right coronary artery was large dominant vessel.  He had proximal 25%  stenosis.   Left ventriculogram:  The left ventriculogram was obtained in the RAO  projection.  The EF was 65% with normal wall motion.   CONCLUSION:  1.  Mild coronary plaque.  2.  Normal left ventricular function.   PLAN:  No further cardiovascular testing is suggested.  The patient will  continue to have aggressive primary risk reduction.           ______________________________  Minus Breeding, M.D.     JH/MEDQ  D:  02/15/2005  T:   02/16/2005  Job:  [id number]   cc:   Hilliard Clark A. Loanne Drilling, M.D. [facility]  [address]  [place] [address]

## 2010-11-12 ENCOUNTER — Encounter: Payer: Self-pay | Admitting: Family Medicine

## 2010-11-12 ENCOUNTER — Ambulatory Visit (INDEPENDENT_AMBULATORY_CARE_PROVIDER_SITE_OTHER): Payer: Self-pay | Admitting: Family Medicine

## 2010-11-12 DIAGNOSIS — G629 Polyneuropathy, unspecified: Secondary | ICD-10-CM

## 2010-11-12 DIAGNOSIS — E119 Type 2 diabetes mellitus without complications: Secondary | ICD-10-CM

## 2010-11-12 DIAGNOSIS — E1149 Type 2 diabetes mellitus with other diabetic neurological complication: Secondary | ICD-10-CM

## 2010-11-12 DIAGNOSIS — G609 Hereditary and idiopathic neuropathy, unspecified: Secondary | ICD-10-CM

## 2010-11-12 MED ORDER — GABAPENTIN 300 MG PO CAPS: ORAL_CAPSULE | ORAL | Status: DC

## 2010-11-12 MED ORDER — OXYCODONE-ACETAMINOPHEN 5-325 MG PO TABS: 1.0000 | ORAL_TABLET | Freq: Four times a day (QID) | ORAL | Status: DC | PRN

## 2010-11-12 NOTE — Progress Notes (Signed)
Subjective:    [name] is a 55 y.o. female who presents for follow-up of Type 2 diabetes mellitus.  Current symptoms/problems include paresthesia of the feet and have been worsening. Symptoms have been present for several months. Pt presents for follow up of her lower extremity ulcer, which is slowly healing.  She continues to complain of severe diabetic peripheral neuropathy uncontrolled on her regimen of effexor and neurontin 128m/d.  Tramadol is also ineffective.    Known diabetic complications: peripheral neuropathy Cardiovascular risk factors: diabetes mellitus, dyslipidemia, hypertension and obesity (BMI >= 30 kg/m2) Current diabetic medications include oral agent (monotherapy): metformin (generic) and insulin injections: NPH/Reg 70/30: 250/150.  Eye exam current (within one year): yes Weight trend: fluctuating a bit Prior visit with dietician: yes - still underway Current diet: diabetic Current exercise: none  Current monitoring regimen: home blood tests - daily Home blood sugar records: stable without hypoglycemic episodes Any episodes of hypoglycemia? no  Is She on ACE inhibitor or angiotensin II receptor blocker?  Yes  lisinopril (Prinivil) she is also on maxide-hctz with recently normal cr and potassium  The following portions of the patient's history were reviewed and updated as appropriate: allergies, current medications, past family history, past medical history, past social history, past surgical history and problem list.  Review of Systems Pertinent items are noted in HPI.    Objective:    BP 128/65  Pulse 74  Temp(Src) 98.3 F (36.8 C) (Oral)  Wt 322 lb (146.058 kg)  General:  alert and cooperative  Oropharynx: lips, mucosa, and tongue normal; teeth and gums normal   Eyes:  conjunctivae/corneas clear. PERRL, EOM's intact. Fundi benign.   Ears:  normal TM's and external ear canals both ears  Neck: no adenopathy, no carotid bruit, no JVD, supple,  symmetrical, trachea midline and thyroid not enlarged, symmetric, no tenderness/mass/nodules  Thyroid:  no palpable nodule  Lung: clear to auscultation bilaterally  Heart:  regular rate and rhythm, S1, S2 normal, no murmur, click, rub or gallop  Abdomen: soft, non-tender; bowel sounds normal; no masses,  no organomegaly  Extremities: venous stasis dermatitis noted and left lower extremity ulcer with central weeping no purulent discharge, mild surrounding erythema, with some tenderness, +psoriatic changes in addition to venous changes.   Skin: warm and dry, no hyperpigmentation, vitiligo, or suspicious lesions and see about  Pulses: n/a  Neuro: abnormal neurological exam unchanged from prior examinations   Lab Review Glucose, Bld (mg/dL)  Date Value  10/29/2010 89   09/17/2010 233*  05/28/2010 291*  04/09/2006 358*     CO2 (mEq/L)  Date Value  10/29/2010 28   09/17/2010 26   05/28/2010 26      BUN (mg/dL)  Date Value  10/29/2010 18   09/17/2010 17   05/28/2010 16      Creat (mg/dL)  Date Value  10/29/2010 0.99   09/17/2010 0.91      Creatinine, Ser (mg/dL)  Date Value  05/28/2010 0.77   03/28/2010 0.7   06/02/2009 0.85    hgA1c 7.2    Assessment:    Diabetes Mellitus type II, under fair control.  LE ulcer DM neuropathy   Plan:    1.  Rx changes: none 2.  Education: Reviewed 'ABCs' of diabetes management (respective goals in parentheses):  A1C (<7), blood pressure (<130/80), and cholesterol (LDL <100). 3.  Compliance at present is estimated to be good. Efforts to improve compliance (if necessary) will be directed at regular blood sugar monitoring: 3  times daily. 4. Follow up: 1 months  5. Increased neurontin to 900 TID for total of 2734m/d.  Pt may have max of 36065md. Also added percocet for additional pain relief.  Pt already on maximum of effexor 22536m.   6. Wound ctr referral for ulcer.

## 2010-11-12 NOTE — Patient Instructions (Addendum)
-   Physical activity:  Use the arm ergometer at least 15 minutes  4 X wk and 100 crunches 4 X wk.  (If 100 crunches feels easy for you, increase by 25, as tolerated.)  With any exercise, stop if it's painful.   - Reminder:  [place] and at least a snack.  PRIORITIES:  You need three meals and at least one snack EVERY day regardless of what is going on! - Obtain twice as many veg's as protein or carbohydrate foods for both lunch and dinner. - GOAL:  Spend at least 45- 60 minutes writing 3 X wk.

## 2010-11-12 NOTE — Patient Instructions (Signed)
It was a pleasure to care for you today.  Please schedule a follow up for one month for wound care, hypertension and diabetes.  Please take medication as instructed.  Stop the tramadol.

## 2010-11-14 ENCOUNTER — Other Ambulatory Visit: Payer: Self-pay | Admitting: Family Medicine

## 2010-11-14 MED ORDER — VENLAFAXINE HCL ER 75 MG PO CP24: 75.0000 mg | ORAL_CAPSULE | Freq: Every day | ORAL | Status: DC

## 2010-11-21 ENCOUNTER — Telehealth: Payer: Self-pay | Admitting: *Deleted

## 2010-11-21 NOTE — Telephone Encounter (Signed)
Helene Kelp from [facility] calls to let MD know that Lyrica is not available thru [facility] .

## 2010-11-22 ENCOUNTER — Encounter (HOSPITAL_BASED_OUTPATIENT_CLINIC_OR_DEPARTMENT_OTHER): Payer: Self-pay | Attending: Internal Medicine

## 2010-11-22 DIAGNOSIS — M79609 Pain in unspecified limb: Secondary | ICD-10-CM | POA: Insufficient documentation

## 2010-11-22 DIAGNOSIS — I2510 Atherosclerotic heart disease of native coronary artery without angina pectoris: Secondary | ICD-10-CM | POA: Insufficient documentation

## 2010-11-22 DIAGNOSIS — Z79899 Other long term (current) drug therapy: Secondary | ICD-10-CM | POA: Insufficient documentation

## 2010-11-22 DIAGNOSIS — I10 Essential (primary) hypertension: Secondary | ICD-10-CM | POA: Insufficient documentation

## 2010-11-22 DIAGNOSIS — Z794 Long term (current) use of insulin: Secondary | ICD-10-CM | POA: Insufficient documentation

## 2010-11-22 DIAGNOSIS — L97809 Non-pressure chronic ulcer of other part of unspecified lower leg with unspecified severity: Secondary | ICD-10-CM | POA: Insufficient documentation

## 2010-11-22 DIAGNOSIS — E1142 Type 2 diabetes mellitus with diabetic polyneuropathy: Secondary | ICD-10-CM | POA: Insufficient documentation

## 2010-11-22 DIAGNOSIS — E1149 Type 2 diabetes mellitus with other diabetic neurological complication: Secondary | ICD-10-CM | POA: Insufficient documentation

## 2010-11-22 DIAGNOSIS — I872 Venous insufficiency (chronic) (peripheral): Secondary | ICD-10-CM | POA: Insufficient documentation

## 2010-11-22 DIAGNOSIS — Z8614 Personal history of Methicillin resistant Staphylococcus aureus infection: Secondary | ICD-10-CM | POA: Insufficient documentation

## 2010-11-22 DIAGNOSIS — Z7982 Long term (current) use of aspirin: Secondary | ICD-10-CM | POA: Insufficient documentation

## 2010-11-22 NOTE — Telephone Encounter (Signed)
Helene Kelp from  [facility] calls back asking if MD could switch Lyrica to Cymbalta and if so,   send in a new RX. Will forward to MD.

## 2010-11-23 NOTE — Assessment & Plan Note (Signed)
[facility]  NAME:  [name], [name]                 ACCOUNT NO.:  [id number]  MEDICAL RECORD NO.:  [id number]      DATE OF BIRTH:  03/17/1956  PHYSICIAN:  Ricard Dillon, M.D.      VISIT DATE:                                  OFFICE VISIT   [name] is a 55 year old woman with type 2 diabetes and diabetic neuropathy.  She tells me that she developed a painful bleeding area on the right anterior leg 2 months ago.  She treated this with topical agents including hydrogen peroxide.  Nevertheless the bleeding from this was severe enough that she apparently went to an ER in Urgent Care. This was sutured and followed by Dr. Loanne Drilling.  One week later she was at a dollar store, felt liquid coming from her leg and again there was extensive bleeding from this area.  She again apparently was seen in an Urgent Care of the ER.  Since then the lesion has been very painful. She has had to increase her Neurontin.  C&S was apparently done that was negative at some point.  I have researched her in [facility] ER.  She did have an x-ray on April 18 that showed no evidence of osteomyelitis.  She underwent a venous evaluation on May 15 that was negative for any significant DVT.  There was no evidence of superficial thrombosis.  No evidence of a Baker's cyst.  PAST MEDICAL HISTORY:  Type 2 diabetes with diabetic neuropathy.  She has nonocclusive coronary artery disease, hypertension, rotator cuff surgery.  She had an MRSA infection on her right arm that required surgical I&D in 2009, although I do not actually see the cultures.  She has had previous right knee surgery.  MEDICATIONS:  List reviewed and she is on: 1. ASA. 2. Lipitor. 3. Gabapentin. 4. NovoLog insulin 70/30, 250 units in the morning and 150 units at     night. 5. She is on metformin 1000 units b.i.d. 6. Accupril 40 daily. 7. Hydrochlorothiazide/ACE inhibitor 37.5/25, 1 daily. 8. Effexor 75, 3 times daily.  REVIEW OF  SYSTEMS:  CARDIAC:  She does not complain of exertional chest pain.  RESPIRATORY:  No cough.  EXTREMITIES:  There may be a claudication complaint here which makes it difficult for her to walk, however, it is difficult to sort out from her neuropathic pain which she says is fairly constant.  PHYSICAL EXAMINATION:  VITAL SIGNS:  Her temperature was 97.7, pulse 79, respirations 20, blood pressure 112/67, CBG was 67. EXTREMITIES:  The area in question was on the right anterior leg measuring 1 x 1.4 x 0.3.  There was some surrounding surface eschar here which was manually debrided.  There was a fair amount of bleeding from one area of this that required a fair amount of silver nitrate and pressure to stop.  I wonder whether there might be a very superficial varicosity here.  In any case the wound was cultured, although there was no evidence of infection, no soft tissue cellulitis.  There is very significant surrounding stasis dermatitis mostly inferior to this wound. Also of note is her peripheral circulation, it is very difficult to obtain.  I even had difficulty feeling anything in her femoral pulses. Her popliteal, dorsalis pedis,  and posterior tibial were absent on the right. GENERAL/RESPIRATORY:  Clear. CARDIAC:  No convincing evidence of heart failure.  IMPRESSION:  Venous stasis ulceration on the right leg.  We applied collagen to this under Kerlix and Coban wrap.  We have ordered arterial Doppler's and ABI evaluation.  A culture was done, although no empiric antibiotics were started at this time.  It was felt that a 2 layer compression would be all that would be safe given her underlying likely PAD.  We will see her back in a weeks' time and she will be called earlier if the cultures return positive.          ______________________________ Ricard Dillon, M.D.     MGR/MEDQ  D:  11/22/2010  T:  11/23/2010  Job:  [id number]

## 2010-11-26 NOTE — Telephone Encounter (Signed)
Spoke with Dr. Megan Salon and she advises that patient is on Neurontin and also is on Effexor and she does not need Cymbalta. Message left for HD to call back

## 2010-11-26 NOTE — Telephone Encounter (Signed)
[facility] notified.

## 2010-11-29 ENCOUNTER — Encounter (HOSPITAL_BASED_OUTPATIENT_CLINIC_OR_DEPARTMENT_OTHER): Payer: Self-pay | Attending: Internal Medicine

## 2010-11-29 DIAGNOSIS — Z8614 Personal history of Methicillin resistant Staphylococcus aureus infection: Secondary | ICD-10-CM | POA: Insufficient documentation

## 2010-11-29 DIAGNOSIS — M79609 Pain in unspecified limb: Secondary | ICD-10-CM | POA: Insufficient documentation

## 2010-11-29 DIAGNOSIS — Z7982 Long term (current) use of aspirin: Secondary | ICD-10-CM | POA: Insufficient documentation

## 2010-11-29 DIAGNOSIS — I10 Essential (primary) hypertension: Secondary | ICD-10-CM | POA: Insufficient documentation

## 2010-11-29 DIAGNOSIS — L97809 Non-pressure chronic ulcer of other part of unspecified lower leg with unspecified severity: Secondary | ICD-10-CM | POA: Insufficient documentation

## 2010-11-29 DIAGNOSIS — Z794 Long term (current) use of insulin: Secondary | ICD-10-CM | POA: Insufficient documentation

## 2010-11-29 DIAGNOSIS — I872 Venous insufficiency (chronic) (peripheral): Secondary | ICD-10-CM | POA: Insufficient documentation

## 2010-11-29 DIAGNOSIS — E1149 Type 2 diabetes mellitus with other diabetic neurological complication: Secondary | ICD-10-CM | POA: Insufficient documentation

## 2010-11-29 DIAGNOSIS — Z79899 Other long term (current) drug therapy: Secondary | ICD-10-CM | POA: Insufficient documentation

## 2010-11-29 DIAGNOSIS — E1142 Type 2 diabetes mellitus with diabetic polyneuropathy: Secondary | ICD-10-CM | POA: Insufficient documentation

## 2010-11-29 DIAGNOSIS — I2510 Atherosclerotic heart disease of native coronary artery without angina pectoris: Secondary | ICD-10-CM | POA: Insufficient documentation

## 2010-12-04 ENCOUNTER — Encounter: Payer: Self-pay | Admitting: Family Medicine

## 2010-12-04 ENCOUNTER — Ambulatory Visit (INDEPENDENT_AMBULATORY_CARE_PROVIDER_SITE_OTHER): Payer: Self-pay | Admitting: Family Medicine

## 2010-12-04 VITALS — BP 113/69 | HR 80 | Temp 97.8°F | Wt 327.0 lb

## 2010-12-04 DIAGNOSIS — E1149 Type 2 diabetes mellitus with other diabetic neurological complication: Secondary | ICD-10-CM

## 2010-12-04 DIAGNOSIS — Z860100 Personal history of colon polyps, unspecified: Secondary | ICD-10-CM

## 2010-12-04 DIAGNOSIS — R748 Abnormal levels of other serum enzymes: Secondary | ICD-10-CM | POA: Insufficient documentation

## 2010-12-04 HISTORY — DX: Personal history of colon polyps, unspecified: Z86.0100

## 2010-12-04 HISTORY — DX: Personal history of colonic polyps: Z86.010

## 2010-12-04 NOTE — Progress Notes (Signed)
Medical Nutrition Therapy:  Appt start time: 1500 end time:  1600.  Assessment:  Primary concerns today: Weight management and Blood sugar control.  A lot of good things have happened for [name] and her husband, including getting their bathroom modified so her husband can use it with his wheelchair.  This has increased his confidence and independence, which has freed up some of [name]'s time and alleviated much of her stress.  Still, she has not made any significant behavior changes.  FBG have been 62-265, with most 119-149.  [name] has been out of Metformin for almost 2 wk, which she is filling today.  Did not do a 24-hr recall today b/c [name] had been sick Fri thru Sun, and is just starting to get back to normal eating.  She said she has been getting veg's about 1 X day.  Recent physical activity includes none.  [name] has a friend who has asked her to join [employer], which she is considering.    Progress Towards Goal(s):  In progress.   Nutritional Diagnosis:  NB-1.5 Disordered eating pattern As related to compulsive eating.  As evidenced by no behavior changes per patient. NB-2.1 Physical inactivity As related to pain and poor fitness level.  As evidenced by no current exercise.    Intervention:  Nutrition counseling.  Monitoring/Evaluation:  Dietary intake, exercise, BG, and body weight in 4 weeks.

## 2010-12-04 NOTE — Patient Instructions (Addendum)
-   Look into a scholarship at the [facility]:  [phone]. - Goals:    1. At least 3 meals and one snack a day.  (Snacks might be yogurt, fruit, or veg's.)  2. Vegetables twice a day.   3. Drink at least 6 to 8 cups of water per day, with at least 4 cups being plain water.      - Record your successes with above daily goals, using an Nurse, mental health.  Email Jeannie every Friday.   - Keep me posted re. Weight Watchers and also how much you are exercising.   - Cereals:  Look for at least 5 grams of fiber per serving, i.e., All Bran, Wheat Chex, Frosted Mini-Wheats, most bran cereal, Shredded Wheat 'n Bran.

## 2010-12-04 NOTE — Patient Instructions (Signed)
It was a pleasure to care for you today.  Please make a follow up appointment in 2 months for re-evaluation.  Please call and schedule your colonscopy within the next month.  Return sooner if you have any feelings of decreased urine output, fever, chills, nausea, vomiting, worsening of your leg, poor control of your sugars, or any other concerning symptom.

## 2010-12-04 NOTE — Progress Notes (Signed)
Subjective:     [name] is a 55 y.o. female and is here for a follow up of lower extremity ulcer and DM neuropathy. The patient reports no problems.  History   Social History  . Marital Status: Married    Spouse Name: N/A    Number of Children: N/A  . Years of Education: N/A   Occupational History  . Not on file.   Social History Main Topics  . Smoking status: Former Smoker    Quit date: 08/06/1994  . Smokeless tobacco: Former Systems developer    Quit date: 10/18/1994  . Alcohol Use: Not on file  . Drug Use: Not on file  . Sexually Active: Not on file   Other Topics Concern  . Not on file   Social History Narrative  . No narrative on file   Health Maintenance  Topic Date Due  . Pap Smear  09/09/1972  . Colonoscopy  09/09/2004  . Influenza Vaccine  03/25/2011  . Mammogram  08/17/2012  . Tetanus/tdap  01/19/2015    The following portions of the patient's history were reviewed and updated as appropriate: allergies, current medications, past family history, past medical history, past social history, past surgical history and problem list.  Review of Systems Pertinent items are noted in HPI.   Objective:    BP 113/69  Pulse 80  Temp(Src) 97.8 F (36.6 C) (Oral)  Wt 327 lb (148.326 kg) General appearance: alert, cooperative and appears stated age Lungs: clear to auscultation bilaterally Heart: regular rate and rhythm, S1, S2 normal, no murmur, click, rub or gallop Extremities: RLE with overlying bandage. unable to examine, LLE with psoriatic changes on the anterior surface, improving    Labs: elevated alk phos, nl ast/alt Assessment:    1. Hypertension-well controlled 2. DM II with neuropathy-improving with neurontin and current medication regiment.  Last HgA1c 7.2 3. Well adult care-+FHx colon cancer, h/o colon polyps  4. Elevated alk phos     Plan:    1. Continue wound center evaluation every thurs.  2. Cont current diabetic regiment 3. Colonoscopy referral for  CRCS 4. UTD on pap and mammo.  5. HTN on ACEi and maxide/hctz-currently normotensive and normal K+ and creatinine.  Monitor with serial labs every 3-6 mo.  Next due in 2 mo 6. Will check GGT for elev alk phos 7. F/u in 2 mo.  See After Visit Summary for Counseling Recommendations

## 2010-12-24 ENCOUNTER — Ambulatory Visit (INDEPENDENT_AMBULATORY_CARE_PROVIDER_SITE_OTHER): Payer: Self-pay | Admitting: Endocrinology

## 2010-12-24 ENCOUNTER — Encounter: Payer: Self-pay | Admitting: Endocrinology

## 2010-12-24 ENCOUNTER — Encounter (HOSPITAL_BASED_OUTPATIENT_CLINIC_OR_DEPARTMENT_OTHER): Payer: Self-pay | Attending: Internal Medicine

## 2010-12-24 DIAGNOSIS — E1149 Type 2 diabetes mellitus with other diabetic neurological complication: Secondary | ICD-10-CM

## 2010-12-24 DIAGNOSIS — L97809 Non-pressure chronic ulcer of other part of unspecified lower leg with unspecified severity: Secondary | ICD-10-CM | POA: Insufficient documentation

## 2010-12-24 DIAGNOSIS — E119 Type 2 diabetes mellitus without complications: Secondary | ICD-10-CM | POA: Insufficient documentation

## 2010-12-24 DIAGNOSIS — I872 Venous insufficiency (chronic) (peripheral): Secondary | ICD-10-CM | POA: Insufficient documentation

## 2010-12-24 NOTE — Progress Notes (Signed)
Subjective:    Patient ID: [name], female    DOB: 1956-02-05, 55 y.o.   MRN: [id number]  HPI Pt is taking 250 units qam, and 150 in the evening.  no cbg record, but states cbg's are well-controlled.  She has mild hypoglycemia (40's).  This usually happens in the afternoon, approx 4-5/month.  It happens more often with a missed or delayed meal. Past Medical History  Diagnosis Date  . GOITER, MULTINODULAR 05/13/2007  . DIABETES MELLITUS, WITH NEUROLOGICAL COMPLICATIONS 3/71/6967  . HYPERCHOLESTEROLEMIA 03/20/2010  . DYSLIPIDEMIA 05/13/2007  . DEPRESSION, CHRONIC 10/06/2007  . OBSTRUCTIVE SLEEP APNEA 06/12/2007  . PERIPHERAL NEUROPATHY 01/12/2009  . HYPERTENSION 03/20/2010  . UNSPECIFIED VENOUS INSUFFICIENCY 05/28/2010  . PSORIASIS 05/28/2010  . BACK PAIN, LUMBAR 11/19/2007  . ANEMIA, PERNICIOUS, HX OF 05/13/2007  . ASYMPTOMATIC POSTMENOPAUSAL STATUS 02/18/2008  . Hx of colonic polyps 12/04/2010  . NASH (nonalcoholic steatohepatitis)     Past Surgical History  Procedure Date  . Knee arthroscopy 2003    right  . Varicose vein surgery     Remotef  . Tonsillectomy   . Exercise myoview 01/24/2005  . Electrocardiogram 04/16/2006    History   Social History  . Marital Status: Married    Spouse Name: N/A    Number of Children: 2  . Years of Education: HSG   Occupational History  . Unemployed    Social History Main Topics  . Smoking status: Former Smoker    Quit date: 08/06/1994  . Smokeless tobacco: Former Systems developer    Quit date: 10/18/1994  . Alcohol Use: Not on file  . Drug Use: Not on file  . Sexually Active: Not on file   Other Topics Concern  . Not on file   Social History Narrative   Married '[id number] x 2, 1 son '91 8.5lbs, 1 daughter '92 10lbs    Current Outpatient Prescriptions on File Prior to Visit  Medication Sig Dispense Refill  . aspirin 81 MG tablet Take 81 mg by mouth daily.        Marland Kitchen atorvastatin (LIPITOR) 80 MG tablet Take 1 tablet (80 mg total)  by mouth daily.  90 tablet  0  . Blood Glucose Monitoring Suppl (ONE TOUCH ULTRA MINI) W/DEVICE KIT Use glucometer for home glucose monitoring, one time daily  1 each  0  . clobetasol (TEMOVATE) 0.05 % cream Apply topically 2 (two) times daily.  30 g  0  . fish oil-omega-3 fatty acids 1000 MG capsule Take 2 g by mouth daily.        Marland Kitchen gabapentin (NEURONTIN) 300 MG capsule Take three tabs po TID for neuropathy  270 capsule  1  . insulin aspart protamine-insulin aspart (NOVOLOG 70/30) (70-30) 100 UNIT/ML injection Inject 150 Units into the skin at bedtime.  40 mL  2  . metFORMIN (GLUCOPHAGE) 1000 MG tablet Take 1 tablet (1,000 mg total) by mouth 2 (two) times daily with meals.  60 tablet  11  . metoprolol (TOPROL XL) 50 MG 24 hr tablet Take 1 tablet (50 mg total) by mouth daily.  30 tablet  11  . oxyCODONE-acetaminophen (ROXICET) 5-325 MG per tablet Take 1 tablet by mouth every 6 (six) hours as needed for pain.  60 tablet  0  . quinapril (ACCUPRIL) 40 MG tablet Take 1 tablet (40 mg total) by mouth daily.  30 tablet  11  . triamterene-hydrochlorothiazide (MAXZIDE-25) 37.5-25 MG per tablet Take 1 tablet by mouth daily.  30 tablet  11  .  venlafaxine (EFFEXOR-XR) 75 MG 24 hr capsule Take 1 capsule (75 mg total) by mouth daily. Ignore previous sig.  Take 3 caps daily.  90 capsule  0    Allergies  Allergen Reactions  . Keflex (Cephalexin) Hives  . Neosporin (Triple Antibiotic) Rash    Family History  Problem Relation Age of Onset  . Colon cancer Father   . Cancer Father     Colon Cancer  . Hyperlipidemia Father   . Hypertension Father   . Cirrhosis Father   . Colon cancer Sister   . Cancer Sister     Colon Cancer  . Aneurysm Mother   . Drug abuse Neg Hx   . Heart disease Neg Hx     CAD    BP 122/72  Pulse 79  Temp(Src) 98.5 F (36.9 C) (Oral)  Ht 5' 9"  (1.753 m)  Wt 331 lb (150.141 kg)  BMI 48.88 kg/m2  SpO2 95%    Review of Systems Denies loc    Objective:   Physical  Exam Feet: (sees wound care). SKIN:  Insulin injection sites at the anterior abdomen are normal.      Lab Results  Component Value Date   HGBA1C 7.2 11/12/2010   Assessment & Plan:  Dm, she needs some adjustment in her therapy.  this is the best control this pt should aim for, given this regimen, which does match insulin to her changing needs throughout the day

## 2010-12-24 NOTE — Patient Instructions (Addendum)
Decrease the morning insulin to 225 units.  Continue 150 units with the evening meal. good diet and exercise habits significanly improve the control of your diabetes.  please let me know if you wish to be referred to a dietician.  high blood sugar is very risky to your health.  you should see an eye doctor every year. controlling your blood pressure and cholesterol drastically reduces the damage diabetes does to your body.  this also applies to quitting smoking.  please discuss these with your doctor.  you should take an aspirin every day, unless you have been advised by a doctor not to. check your blood sugar 1 time a day.  vary the time of day when you check, between before the 3 meals, and at bedtime.  also check if you have symptoms of your blood sugar being too high or too low.  please keep a record of the readings and bring it to your next appointment here.  please call us sooner if you are having low blood sugar episodes. Please make a follow-up appointment in 3 months. (update: i left message on phone-tree:  rx as we discussed).

## 2011-01-01 ENCOUNTER — Other Ambulatory Visit: Payer: Self-pay | Admitting: Family Medicine

## 2011-01-01 DIAGNOSIS — E7800 Pure hypercholesterolemia, unspecified: Secondary | ICD-10-CM

## 2011-01-01 MED ORDER — ATORVASTATIN CALCIUM 80 MG PO TABS: 80.0000 mg | ORAL_TABLET | Freq: Every day | ORAL | Status: DC

## 2011-01-03 ENCOUNTER — Ambulatory Visit: Payer: Self-pay | Admitting: Family Medicine

## 2011-01-10 ENCOUNTER — Ambulatory Visit (INDEPENDENT_AMBULATORY_CARE_PROVIDER_SITE_OTHER): Payer: Self-pay | Admitting: Family Medicine

## 2011-01-10 DIAGNOSIS — E1149 Type 2 diabetes mellitus with other diabetic neurological complication: Secondary | ICD-10-CM

## 2011-01-10 NOTE — Patient Instructions (Addendum)
-   Yesterday's intake:    Hotdogs, [place] fries, crackers, and soup are all high in sodium.    Hotdogs, [place] fries, and crackers are all high in fat as well.    Where were yesterday's vegetables?  Where was yesterday's breakfast? - Reminder:  You need to keep your appointments in a calendar! - Reminder:  Look into scholarship at the [facility]. - Lack of sleep is a serious problem for your health.    - GOALS:  1. At least 3 meals and one snack a day. (Snacks might be yogurt, fruit, or veg's.)   2. Vegetables twice a day.   3. Get to bed no later than midnight every night.  - Try microwaving onions and squash.

## 2011-01-10 NOTE — Progress Notes (Signed)
Medical Nutrition Therapy:  Appt start time: 1200 end time:  1230.  Assessment:  Primary concerns today: Weight management and Blood sugar control.  Yenty is planning to join [employer] in August when she can afford it.  She has not recorded successes, as discussed at last appt, but she did make a list of why she is not doing better meeting her goals.  This list included the following:  "lazy, poor memory, tired, sleepy, distracted, and too much else on my mind."  I pointed out to Arthella that most of these could be improved with more and better quality sleep.  She had run out of distilled water for her CPAP machine, but now has some, and will re-start using her CPAP tonight.  Kessler has had company, so has had 2-5 hrs of sleep for several nights.  24-hr recall: (up ~ 9 AM); B- none; Snk (12 PM)- 16 oz; L (2 PM)- 1 can tomato soup w/ f-f milk, 1 sleeve Ritz crackers, diet sweet tea; Snk (6 PM)- 1-2 c Frosted Miniwheats; D (10 PM)- 2 hotdogs in buns w/ mustard, French fries (from frozen), diet sweet tea; Snk (6 PM)- 1-2 c Frosted Miniwheats.  Anitha's R leg wound is not healing well.  The physician there has advised her to NOT exercise, and to keep her legs elevated as much as she can until healed.    Progress Towards Goal(s):  In progress.   Nutritional Diagnosis:  NB-1.5 Disordered eating pattern As related to compulsive eating.  As evidenced by no behavior changes per patient. NB-2.1 Physical inactivity As related to pain and poor fitness level.  As evidenced by no current exercise.    Intervention:  Nutrition counseling.  Monitoring/Evaluation:  Dietary intake, exercise, BG, and body weight in 3 weeks.

## 2011-01-17 ENCOUNTER — Encounter: Payer: Self-pay | Admitting: Family Medicine

## 2011-01-17 ENCOUNTER — Other Ambulatory Visit: Payer: Self-pay | Admitting: Gastroenterology

## 2011-01-24 ENCOUNTER — Encounter (HOSPITAL_BASED_OUTPATIENT_CLINIC_OR_DEPARTMENT_OTHER): Payer: Self-pay | Attending: Internal Medicine

## 2011-01-24 DIAGNOSIS — I872 Venous insufficiency (chronic) (peripheral): Secondary | ICD-10-CM | POA: Insufficient documentation

## 2011-01-24 DIAGNOSIS — L97809 Non-pressure chronic ulcer of other part of unspecified lower leg with unspecified severity: Secondary | ICD-10-CM | POA: Insufficient documentation

## 2011-01-24 DIAGNOSIS — E119 Type 2 diabetes mellitus without complications: Secondary | ICD-10-CM | POA: Insufficient documentation

## 2011-01-31 ENCOUNTER — Ambulatory Visit: Payer: Self-pay | Admitting: Family Medicine

## 2011-01-31 ENCOUNTER — Other Ambulatory Visit (HOSPITAL_BASED_OUTPATIENT_CLINIC_OR_DEPARTMENT_OTHER): Payer: Self-pay | Admitting: Internal Medicine

## 2011-01-31 LAB — GLUCOSE, CAPILLARY: Glucose-Capillary: 323 mg/dL — ABNORMAL HIGH (ref 70–99)

## 2011-02-15 ENCOUNTER — Ambulatory Visit (AMBULATORY_SURGERY_CENTER): Payer: Self-pay | Admitting: *Deleted

## 2011-02-15 VITALS — Ht 69.0 in | Wt 337.2 lb

## 2011-02-15 DIAGNOSIS — Z1211 Encounter for screening for malignant neoplasm of colon: Secondary | ICD-10-CM

## 2011-02-15 NOTE — Progress Notes (Signed)
Pt given sample of Suprep. [name]

## 2011-02-21 ENCOUNTER — Encounter: Payer: Self-pay | Admitting: Gastroenterology

## 2011-02-21 ENCOUNTER — Ambulatory Visit (AMBULATORY_SURGERY_CENTER): Payer: Self-pay | Admitting: Gastroenterology

## 2011-02-21 DIAGNOSIS — D126 Benign neoplasm of colon, unspecified: Secondary | ICD-10-CM

## 2011-02-21 DIAGNOSIS — Z800 Family history of malignant neoplasm of digestive organs: Secondary | ICD-10-CM

## 2011-02-21 DIAGNOSIS — Z1211 Encounter for screening for malignant neoplasm of colon: Secondary | ICD-10-CM

## 2011-02-21 DIAGNOSIS — Z86010 Personal history of colonic polyps: Secondary | ICD-10-CM

## 2011-02-21 LAB — GLUCOSE, CAPILLARY: Glucose-Capillary: 184 mg/dL — ABNORMAL HIGH (ref 70–99)

## 2011-02-21 MED ORDER — SODIUM CHLORIDE 0.9 % IV SOLN: 500.0000 mL | INTRAVENOUS | Status: DC

## 2011-02-21 NOTE — Progress Notes (Signed)
Called Dr. Hilarie Fredrickson this a.m.- she mixed this a.m.'s prep with grape juice.  Dr. Hilarie Fredrickson told her that was "fine, just mention it to Dr .Deatra Ina"  Tilman Neat, CRNA administering propofol.

## 2011-02-21 NOTE — Patient Instructions (Signed)
Polyps, Colon  A polyp is extra tissue that grows inside your body. Colon polyps grow in the large intestine. The large intestine, also called the colon, is part of your digestive system. It is a long, hollow tube at the end of your digestive tract where your body makes and stores stool. Most polyps are not dangerous. They are benign. This means they are not cancerous. But over time, some types of polyps can turn into cancer. Polyps that are smaller than a pea are usually not harmful. But larger polyps could someday become or may already be cancerous. To be safe, doctors remove all polyps and test them.  WHO GETS POLYPS? Anyone can get polyps, but certain people are more likely than others. You may have a greater chance of getting polyps if:  You are over 50.   You have had polyps before.   Someone in your family has had polyps.   Someone in your family has had cancer of the large intestine.   Find out if someone in your family has had polyps. You may also be more likely to get polyps if you:   Eat a lot of fatty foods   Smoke   Drink alcohol   Do not exercise  Eat too much  SYMPTOMS Most small polyps do not cause symptoms. People often do not know they have one until their caregiver finds it during a regular checkup or while testing them for something else. Some people do have symptoms like these:  Bleeding from the anus. You might notice blood on your underwear or on toilet paper after you have had a bowel movement.   Constipation or diarrhea that lasts more than a week.   Blood in the stool. Blood can make stool look black or it can show up as red streaks in the stool.  If you have any of these symptoms, see your caregiver. HOW DOES THE DOCTOR TEST FOR POLYPS? The doctor can use four tests to check for polyps:  Digital rectal exam. The caregiver wears gloves and checks your rectum (the last part of the large intestine) to see if it feels normal. This test would find polyps only  in the rectum. Your caregiver may need to do one of the other tests listed below to find polyps higher up in the intestine.   Barium enema. The caregiver puts a liquid called barium into your rectum before taking x-rays of your large intestine. Barium makes your intestine look white in the pictures. Polyps are dark, so they are easy to see.   Sigmoidoscopy. With this test, the caregiver can see inside your large intestine. A thin flexible tube is placed into your rectum. The device is called a sigmoidoscope, which has a light and a tiny video camera in it. The caregiver uses the sigmoidoscope to look at the last third of your large intestine.   Colonoscopy. This test is like sigmoidoscopy, but the caregiver looks at all of the large intestine. It usually requires sedation. This is the most common method for finding and removing polyps.  TREATMENT  The caregiver will remove the polyp during sigmoidoscopy or colonoscopy. The polyp is then tested for cancer.   If you have had polyps, your caregiver may want you to get tested regularly in the future.  PREVENTION There is not one sure way to prevent polyps. You might be able to lower your risk of getting them if you:  Eat more fruits and vegetables and less fatty food.  Do not smoke.   Avoid alcohol.   Exercise every day.   Lose weight if you are overweight.   Eating more calcium and folate can also lower your risk of getting polyps. Some foods that are rich in calcium are milk, cheese, and broccoli. Some foods that are rich in folate are chickpeas, kidney beans, and spinach.   Aspirin might help prevent polyps. Studies are under way.  Document Released: 03/06/2004 Document Re-Released: 11/28/2009 [facility] Patient Information 2011 [place].

## 2011-02-22 ENCOUNTER — Telehealth: Payer: Self-pay

## 2011-02-22 NOTE — Telephone Encounter (Signed)
Left message

## 2011-02-28 ENCOUNTER — Encounter (HOSPITAL_BASED_OUTPATIENT_CLINIC_OR_DEPARTMENT_OTHER): Payer: Self-pay | Attending: Internal Medicine

## 2011-02-28 ENCOUNTER — Other Ambulatory Visit: Payer: Self-pay | Admitting: Gastroenterology

## 2011-02-28 DIAGNOSIS — I872 Venous insufficiency (chronic) (peripheral): Secondary | ICD-10-CM | POA: Insufficient documentation

## 2011-02-28 DIAGNOSIS — L97809 Non-pressure chronic ulcer of other part of unspecified lower leg with unspecified severity: Secondary | ICD-10-CM | POA: Insufficient documentation

## 2011-03-01 ENCOUNTER — Encounter: Payer: Self-pay | Admitting: Family Medicine

## 2011-03-01 ENCOUNTER — Other Ambulatory Visit: Payer: Self-pay | Admitting: *Deleted

## 2011-03-01 DIAGNOSIS — F329 Major depressive disorder, single episode, unspecified: Secondary | ICD-10-CM

## 2011-03-01 MED ORDER — VENLAFAXINE HCL ER 75 MG PO CP24: ORAL_CAPSULE | ORAL | Status: DC

## 2011-03-01 NOTE — Telephone Encounter (Signed)
Received refill request from pharmacy for effexor XR. Consulted Dr. Erin Hearing and he advises can send in a month's worth and patient will need appointment before next refill. Patient notified of this.

## 2011-03-01 NOTE — Telephone Encounter (Signed)
Called patient to clarify dosage on the Effexor XR. States she has been taking 75 mg. 3 capsules daily. Has been on this dose over a year she states. Dr. McDiarmid ok'd refill and faxed to pharmacy. Will need appointment for further refills.

## 2011-03-12 ENCOUNTER — Encounter: Payer: Self-pay | Admitting: Family Medicine

## 2011-03-12 ENCOUNTER — Ambulatory Visit (INDEPENDENT_AMBULATORY_CARE_PROVIDER_SITE_OTHER): Payer: Self-pay | Admitting: Family Medicine

## 2011-03-12 DIAGNOSIS — M545 Low back pain: Secondary | ICD-10-CM

## 2011-03-12 DIAGNOSIS — IMO0002 Reserved for concepts with insufficient information to code with codable children: Secondary | ICD-10-CM

## 2011-03-12 DIAGNOSIS — L259 Unspecified contact dermatitis, unspecified cause: Secondary | ICD-10-CM

## 2011-03-12 MED ORDER — DOXYCYCLINE HYCLATE 100 MG PO TABS: 100.0000 mg | ORAL_TABLET | Freq: Two times a day (BID) | ORAL | Status: AC

## 2011-03-12 NOTE — Assessment & Plan Note (Signed)
Back pain worse, no red flags.  Pt desires referral to ortho.  I will send that in today.

## 2011-03-12 NOTE — Progress Notes (Signed)
  Subjective:    Patient ID: TEKA CHANDA, female    DOB: [birth year], 55 y.o.   MRN: [id number]  HPI  Back Pain- chronic pain x several years in lower back.  Over the last 4-6 weeks, pain has been more constant.  She has pain from back through buttocks down both legs.  She denies weakness, or loss of bowel or bladder.    Boil on wrist- has been there for 2 weeks, seems to be getting bigger but has not drained.  No streaking.  Painful. No fevers.    Skin irritation- hands with dry irritated itchy skin.  Has not tried anything for it.   Review of Systems Denies CP, SOB, HA, N/V/D, fever     Objective:   Physical Exam Vital signs reviewed General appearance - alert, well appearing, and in no distress and oriented to person, place, and time Arm- left arm with 1.5 cm red indurated area with scab.  No surrounding redness, no heat Hands- dry, crusted skin on several fingers of each hand.  Some white scale present and some breaks in skin.  Pt was consented for procedure including risks of bleeding and infection.  The area was cleaned with alcohol.  3 cc of lidocaine were injected.  The area was cleaned with betadine and the I and D was done with an 11 blade needle.   A pressure dressing was applied.       Assessment & Plan:  CELLULITIS/ABSCESS, ARM New small abcess on right wrist.  I and D today.  Since no pus was drained, will start on abx today.  BACK PAIN, LUMBAR Back pain worse, no red flags.  Pt desires referral to ortho.  I will send that in today.    Pt to RTC if boil is worsening and for f/u with new doctor.

## 2011-03-12 NOTE — Assessment & Plan Note (Signed)
ezcema vs psoriasis.  Advised OTC cortisone BID and lotion during day and vaseline at night.  RTC for her PCP to follow up.

## 2011-03-12 NOTE — Patient Instructions (Addendum)
Please keep a bandage on your abcess and clean it with soap and water daily  Please come back if it gets worse instead of better or you can't tolerate the antibiotic I sent in doxycycline to your pharmacy  We sent in your referral for orthopedics.  Your new doctor is Dr. Nehemiah Settle

## 2011-03-12 NOTE — Assessment & Plan Note (Addendum)
New small abcess on right wrist.  I and D today.  Since no pus was drained, will start on abx today.

## 2011-03-19 LAB — DIFFERENTIAL
Basophils Absolute: 0
Lymphocytes Relative: 19
Lymphs Abs: 1.9
Neutro Abs: 7.3

## 2011-03-19 LAB — COMPREHENSIVE METABOLIC PANEL
ALT: 33
BUN: 10
CO2: 26
Calcium: 8.6
Creatinine, Ser: 0.92
GFR calc non Af Amer: >60
Glucose, Bld: 288 — ABNORMAL HIGH
Total Protein: 6.7

## 2011-03-19 LAB — WOUND CULTURE

## 2011-03-19 LAB — TISSUE CULTURE: Gram Stain: NONE SEEN

## 2011-03-19 LAB — CBC
HCT: 36.2
Hemoglobin: 12.4
MCHC: 34.3
MCV: 79.3
RBC: 4.57
RDW: 15.5

## 2011-03-21 LAB — DIFFERENTIAL
Eosinophils Relative: 3
Lymphocytes Relative: 24
Lymphs Abs: 1.5
Monocytes Relative: 7

## 2011-03-21 LAB — CBC
MCHC: 33.3
MCV: 78.6
Platelets: 234
RDW: 15.8 — ABNORMAL HIGH

## 2011-03-21 LAB — COMPREHENSIVE METABOLIC PANEL
ALT: 31
AST: 38 — ABNORMAL HIGH
Albumin: 3.4 — ABNORMAL LOW
CO2: 30
Calcium: 9.6
Creatinine, Ser: 0.92
GFR calc Af Amer: >60
GFR calc non Af Amer: >60
Sodium: 138
Total Protein: 6.9

## 2011-03-21 LAB — URINE MICROSCOPIC-ADD ON

## 2011-03-21 LAB — URINALYSIS, ROUTINE W REFLEX MICROSCOPIC
Glucose, UA: NEGATIVE
Protein, ur: NEGATIVE
Specific Gravity, Urine: 1.024
Urobilinogen, UA: 1

## 2011-03-21 LAB — BASIC METABOLIC PANEL
CO2: 25
Chloride: 102
GFR calc Af Amer: >60
Sodium: 136

## 2011-03-21 LAB — PROTIME-INR: Prothrombin Time: 12.8

## 2011-03-26 ENCOUNTER — Ambulatory Visit (INDEPENDENT_AMBULATORY_CARE_PROVIDER_SITE_OTHER): Payer: Self-pay | Admitting: Family Medicine

## 2011-03-26 ENCOUNTER — Encounter: Payer: Self-pay | Admitting: Family Medicine

## 2011-03-26 DIAGNOSIS — F329 Major depressive disorder, single episode, unspecified: Secondary | ICD-10-CM

## 2011-03-26 DIAGNOSIS — M545 Low back pain: Secondary | ICD-10-CM

## 2011-03-26 MED ORDER — METHOCARBAMOL 500 MG PO TABS: 1000.0000 mg | ORAL_TABLET | Freq: Four times a day (QID) | ORAL | Status: AC

## 2011-03-26 MED ORDER — VENLAFAXINE HCL ER 75 MG PO CP24: ORAL_CAPSULE | ORAL | Status: DC

## 2011-03-26 MED ORDER — LIDOCAINE 5 % EX PTCH: 1.0000 | MEDICATED_PATCH | CUTANEOUS | Status: AC

## 2011-03-26 NOTE — Patient Instructions (Signed)
It was nice meeting you today.  Please try the lidocaine patches to see if these help you.  Also I would like for you to make an appointment with your new primary care doctor, Dr. Nehemiah Settle.

## 2011-03-26 NOTE — Assessment & Plan Note (Signed)
Seems to be acute exacerbation of chronic back pain.  I will start muscle relaxants, will use Robaxin as this is a little less sedating than other muscle relaxants and she does have complaint of her other medicines make her sedated. We'll avoid narcotic pain medications as well because of their sedating effect. I will start on lidocaine patches to be applied to the lower back daily to see if this helps relieve her pain. I've also refilled her Effexor. She will followup in the next couple weeks with her primary care physician.

## 2011-03-26 NOTE — Progress Notes (Signed)
  Subjective:    Patient ID: [name], female    DOB: 09/12/1955, 55 y.o.   MRN: [id number]  HPI 1.  low back pain: Comes in today with complaint of low back pain. Pain is chronic but has a nature of waxing and waning. Her pain does radiate into her buttocks and down the back of her legs and her feet. She is to like her right leg is going to give out at times. Recently her pain has been getting worse and feels like her back catches. She has been having to care for her husband more recently because he has MS. Currently she is using gabapentin 900 mg 3 times a day which says seems to help some.  She is also on Effexor her and tramadol which does not help her a whole lot. Does have a previous MRI from 2009 that shows a herniated nucleus pulposus on the left side of L4-L5. She has been referred to orthopedics for her knee pain as well as her back pain however she has the orange card and received a letter that no one was able to see her at this time. She denies fever, chills, urinary dysfunction, bowel dysfunction.   Review of Systems     Objective:   Physical Exam Generally: Morbidly obese no acute distress MSK: Difficulty with climbing onto the exam table. Initial inspection is normal. She does have tenderness to palpation in the lumbar paraspinal musculature most evident at L4-L5 level, with some associated spasm. Straight leg raise with pain however she does not have symptoms of radiculopathy. Strength is 5 out of 5 in lower extremities bilaterally reflexes at patella and Achilles are 2+ bilaterally. Corky Sox test is tight bilaterally and with mild pain. She is unable to do hyperextension or one legged stork test.        Assessment & Plan:

## 2011-03-28 ENCOUNTER — Encounter (HOSPITAL_BASED_OUTPATIENT_CLINIC_OR_DEPARTMENT_OTHER): Payer: Self-pay | Attending: Internal Medicine

## 2011-03-28 DIAGNOSIS — L97809 Non-pressure chronic ulcer of other part of unspecified lower leg with unspecified severity: Secondary | ICD-10-CM | POA: Insufficient documentation

## 2011-03-28 DIAGNOSIS — I872 Venous insufficiency (chronic) (peripheral): Secondary | ICD-10-CM | POA: Insufficient documentation

## 2011-04-04 ENCOUNTER — Ambulatory Visit (INDEPENDENT_AMBULATORY_CARE_PROVIDER_SITE_OTHER): Payer: Self-pay | Admitting: Family Medicine

## 2011-04-04 ENCOUNTER — Encounter: Payer: Self-pay | Admitting: Family Medicine

## 2011-04-04 VITALS — BP 157/64 | HR 74 | Temp 98.3°F | Ht 69.0 in | Wt 339.7 lb

## 2011-04-04 DIAGNOSIS — E1149 Type 2 diabetes mellitus with other diabetic neurological complication: Secondary | ICD-10-CM

## 2011-04-04 LAB — TSH: TSH: 4.449 u[IU]/mL (ref 0.350–4.500)

## 2011-04-04 NOTE — Patient Instructions (Signed)
1.  Please check your blood sugars 4 times a day (before meals and at bedtime) 2.  Continue to avoid dietary sugar (including pasta, rice, potato, breads, etc) 3.  Do the three back exercises I taught you - twice in the morning and twice at night.  Hold each position for 15 seconds 4.  Use heat for your back (15 minutes at a time.  You may do this 2-4 times a day) 5.  Pick up the lidocaine patches.

## 2011-04-04 NOTE — Progress Notes (Signed)
  Subjective:    Patient ID: [name], female    DOB: Oct 13, 1955, 55 y.o.   MRN: [id number]  HPI Patient seen for followup of DM2, Back pain, and weight. Subjective:     [name] is a 55 y.o. female who presents for follow up of diabetes.. Current symptoms include: none. Patient denies foot ulcerations, hyperglycemia, hypoglycemia , polydipsia, polyuria, visual disturbances, vomiting and weight loss. Evaluation to date has been: fasting blood sugar and hemoglobin A1C. Home sugars: patient checks her blood sugars twice daily - at night and fasting. Current treatments: Continued insulin which has been somewhat effective and Continued metformin which has been effective.   Patient also seen for chronic back pain, which has continued to get worse.  The pain is made worse with ambulation and activity.  Her pain is constant with radiation down her legs bilaterally.  Has not picked up lidocaine patches due to lack of money.  She has continued to gain weight, with a #30 wt gain over the past 6 months.  She reports no changes in her diet, though admits to "cheating" and eating sweats.  She tries to avoid pasta and grains.  Review of Systems Pertinent items are noted in HPI.    Objective:    General appearance: alert, cooperative and no distress Head: Normocephalic, without obvious abnormality, atraumatic Eyes: conjunctivae/corneas clear. PERRL, EOM's intact. Fundi benign. Back: symmetric, no curvature. ROM normal. No CVA tenderness. Heart: regular rate and rhythm, S1, S2 normal, no murmur, click, rub or gallop Abdomen: obese, nontender Extremities: edema 2+ nonpitting edema with chronic venous stasis changes and Homans sign is negative, no sign of DVT Pulses: 2+ and symmetric    Patient was evaluated for proper footwear and sizing.  Laboratory: No components found with this basename: A1C      Assessment:    Diabetes mellitus Type II, under fair control.  Back Pain Obesity  Plan:      Discussed general issues about diabetes pathophysiology and management. Addressed ADA diet. Recommended patient check her blood sugars 4 times a day.  Needs to bring log book next appointment to see when her blood sugars are high.  Consider changing regimen to LA insulin at night with SA insulin premeal.   Will have patient use lidocaine patches. Stretching Pool therapy Heating pad    Review of Systems     Objective:   Physical Exam        Assessment & Plan:

## 2011-04-15 ENCOUNTER — Telehealth: Payer: Self-pay | Admitting: *Deleted

## 2011-04-15 ENCOUNTER — Encounter: Payer: Self-pay | Admitting: Family Medicine

## 2011-04-15 ENCOUNTER — Ambulatory Visit (INDEPENDENT_AMBULATORY_CARE_PROVIDER_SITE_OTHER): Payer: Self-pay | Admitting: Family Medicine

## 2011-04-15 DIAGNOSIS — I10 Essential (primary) hypertension: Secondary | ICD-10-CM

## 2011-04-15 DIAGNOSIS — R609 Edema, unspecified: Secondary | ICD-10-CM

## 2011-04-15 DIAGNOSIS — G4733 Obstructive sleep apnea (adult) (pediatric): Secondary | ICD-10-CM

## 2011-04-15 LAB — COMPREHENSIVE METABOLIC PANEL
ALT: 19 U/L (ref 0–35)
AST: 24 U/L (ref 0–37)
Albumin: 4 g/dL (ref 3.5–5.2)
Calcium: 9.5 mg/dL (ref 8.4–10.5)
Chloride: 102 mEq/L (ref 96–112)
Potassium: 3.9 mEq/L (ref 3.5–5.3)
Sodium: 140 mEq/L (ref 135–145)
Total Protein: 7.1 g/dL (ref 6.0–8.3)

## 2011-04-15 MED ORDER — TORSEMIDE 20 MG PO TABS: 20.0000 mg | ORAL_TABLET | Freq: Every day | ORAL | Status: DC

## 2011-04-15 NOTE — Telephone Encounter (Signed)
Faxed referral form and notes to [facility] orthopedic surgery, they will review notes and decide whether or not they will see patient.Busick, Kevin Fenton

## 2011-04-15 NOTE — Patient Instructions (Addendum)
It was good to see today I am going to draw some baseline labs to evaluate your lower extremity swelling I'm starting you on torsemide for your swelling I'm getting an echocardiogram of the heart  Follow up with Dr. Nehemiah Settle in one to 2 weeks Be sure to wear your CPAP nightly Call with any questions God Bless Shanda Howells MD

## 2011-04-16 ENCOUNTER — Ambulatory Visit (HOSPITAL_COMMUNITY)
Admission: RE | Admit: 2011-04-16 | Discharge: 2011-04-16 | Disposition: A | Discharge status: Home / Self Care | Payer: Self-pay | Source: Ambulatory Visit | Attending: Family Medicine | Admitting: Family Medicine

## 2011-04-16 DIAGNOSIS — R609 Edema, unspecified: Secondary | ICD-10-CM

## 2011-04-16 DIAGNOSIS — I10 Essential (primary) hypertension: Secondary | ICD-10-CM | POA: Insufficient documentation

## 2011-04-16 DIAGNOSIS — E119 Type 2 diabetes mellitus without complications: Secondary | ICD-10-CM | POA: Insufficient documentation

## 2011-04-16 DIAGNOSIS — I509 Heart failure, unspecified: Secondary | ICD-10-CM | POA: Insufficient documentation

## 2011-04-16 DIAGNOSIS — I059 Rheumatic mitral valve disease, unspecified: Secondary | ICD-10-CM | POA: Insufficient documentation

## 2011-04-16 LAB — BRAIN NATRIURETIC PEPTIDE: Brain Natriuretic Peptide: 48.3 pg/mL (ref 0.0–100.0)

## 2011-04-17 DIAGNOSIS — R609 Edema, unspecified: Secondary | ICD-10-CM | POA: Insufficient documentation

## 2011-04-17 NOTE — Assessment & Plan Note (Signed)
Overall history and physical exam higly concerning for heart failure. Pt noted with multiple CV risk factors. Will check Cr and K as well as BNP. Will also set up pt for 2D ECHO. Wil place pt on torsemide for diuresis in setting of morbid obesity. WIl follow up in 1 week.   -Pt is noted to be going to [facility] for the weekene. CV red flags discussed at length.

## 2011-04-17 NOTE — Progress Notes (Signed)
  Subjective:    Patient ID: [name], female    DOB: [birth year], 55 y.o.   MRN: [id number]  HPI LE swelling x 3 days. Pt states that she has a baseline history of this in the past. However, this has gotten acutely worse. Pt does report high sodium intake above baseline over the last week. Other sxs include orthopnea, DOE. No chest pain. Pt also with baseline hx/o sleep apnea. Pt has not been wearing CPAP.    Review of Systems See HPI     Objective:   Physical Exam Gen: up in chair, NAD, morbidly obese  HEENT: large neck girth, neck full ROM CV: RRR, no murmurs auscultated PULM: distant lung sounds, no wheezes ABD: S/NT/morbidly obese EXT: 2+ peripheral pulses, 2-3+ pitting edema bilaterally          Assessment & Plan:

## 2011-04-17 NOTE — Assessment & Plan Note (Signed)
Likely major contribution of volume and sodium to this today. Will start pt on torsemide to help with fluid/Na status. Will also check Cr and K. Discussed low salt diet. Will follow up in 1 week.

## 2011-04-17 NOTE — Assessment & Plan Note (Signed)
Currenty not using. Stressed compliance.

## 2011-04-23 ENCOUNTER — Encounter: Payer: Self-pay | Admitting: Family Medicine

## 2011-04-23 ENCOUNTER — Ambulatory Visit (INDEPENDENT_AMBULATORY_CARE_PROVIDER_SITE_OTHER): Payer: Self-pay | Admitting: Family Medicine

## 2011-04-23 VITALS — BP 158/66 | HR 73 | Temp 97.8°F | Ht 69.0 in | Wt 330.5 lb

## 2011-04-23 DIAGNOSIS — M1712 Unilateral primary osteoarthritis, left knee: Secondary | ICD-10-CM

## 2011-04-23 DIAGNOSIS — M1710 Unilateral primary osteoarthritis, unspecified knee: Secondary | ICD-10-CM

## 2011-04-23 DIAGNOSIS — M545 Low back pain: Secondary | ICD-10-CM

## 2011-04-23 DIAGNOSIS — Z23 Encounter for immunization: Secondary | ICD-10-CM

## 2011-04-23 HISTORY — DX: Unilateral primary osteoarthritis, left knee: M17.12

## 2011-04-23 NOTE — Patient Instructions (Signed)
Osteoarthritis Osteoarthritis is the most common form of arthritis. It is redness, soreness, and swelling (inflammation) affecting the cartilage. Cartilage acts as a cushion, covering the ends of bones where they meet to form a joint. CAUSES  Over time, the cartilage begins to wear away. This causes bone to rub on bone. This produces pain and stiffness in the affected joints. Factors that contribute to this problem are:  Excessive body weight.   Age.   Overuse of joints.  SYMPTOMS   People with osteoarthritis usually experience joint pain, swelling, or stiffness.   Over time, the joint may lose its normal shape.   Small deposits of bone (osteophytes) may grow on the edges of the joint.   Bits of bone or cartilage can break off and float inside the joint space. This may cause more pain and damage.   Osteoarthritis can lead to depression, anxiety, feelings of helplessness, and limitations on daily activities.  The most commonly affected joints are in the:  Ends of the fingers.   Thumbs.   Neck.   Lower back.   Knees.   Hips.  DIAGNOSIS  Diagnosis is mostly based on your symptoms and exam. Tests may be helpful, including:  X-rays of the affected joint.   A computerized magnetic scan (MRI).   Blood tests to rule out other types of arthritis.   Joint fluid tests. This involves using a needle to draw fluid from the joint and examining the fluid under a microscope.  TREATMENT  Goals of treatment are to control pain, improve joint function, maintain a normal body weight, and maintain a healthy lifestyle. Treatment approaches may include:  A prescribed exercise program with rest and joint relief.   Weight control with nutritional education.   Pain relief techniques such as:   Properly applied heat and cold.   Electric pulses delivered to nerve endings under the skin (transcutaneous electrical nerve stimulation, TENS).   Massage.   Certain supplements. Ask your  caregiver before using any supplements, especially in combination with prescribed drugs.   Medicines to control pain, such as:   Acetaminophen.   Nonsteroidal anti-inflammatory drugs (NSAIDs), such as naproxen.   Narcotic or central-acting agents, such as tramadol. This drug carries a risk of addiction and is generally prescribed for short-term use.   Corticosteroids. These can be given orally or as injection. This is a short-term treatment, not recommended for routine use.   Surgery to reposition the bones and relieve pain (osteotomy) or to remove loose pieces of bone and cartilage. Joint replacement may be needed in advanced states of osteoarthritis.  HOME CARE INSTRUCTIONS  Your caregiver can recommend specific types of exercise. These may include:  Strengthening exercises. These are done to strengthen the muscles that support joints affected by arthritis. They can be performed with weights or with exercise bands to add resistance.   Aerobic activities. These are exercises, such as brisk walking or low-impact aerobics, that get your heart pumping. They can help keep your lungs and circulatory system in shape.   Range-of-motion activities. These keep your joints limber.   Balance and agility exercises. These help you maintain daily living skills.  Learning about your condition and being actively involved in your care will help improve the course of your osteoarthritis. SEEK MEDICAL CARE IF:   You feel hot or your skin turns red.   You develop a rash in addition to your joint pain.   You have an oral temperature above 102 F (38.9 C).  FOR  [place] of Arthritis and Musculoskeletal and Skin Diseases: www.niams.SouthExposed.es [employer] on Aging: http://kim-miller.com/ American College of Rheumatology: www.rheumatology.org Document Released: 06/10/2005 Document Revised: 02/20/2011 Document Reviewed: 09/21/2009 [facility] Patient Information 2012 [place].

## 2011-04-23 NOTE — Assessment & Plan Note (Signed)
I discussed with the patient that she does not except by [facility] orthopedics for evaluation of her back pain. I will look into other options and discussed this at the patient's next visit.

## 2011-04-23 NOTE — Assessment & Plan Note (Signed)
I discussed at length with the patient management options including NSAIDs, steroid injections, knee replacement. Due to the patient's age and hasn't to refer the patient for orthopedic evaluation for a replacement due to the likely need for revision at a future date. She has not had a trial of steroid injections. I discussed the risks and benefits of receiving steroid injections, which we did today. Consent was signed and is on the chart.

## 2011-04-23 NOTE — Progress Notes (Signed)
  Subjective:    Patient ID: [name], female    DOB: 18-Apr-1956, 55 y.o.   MRN: [id number]  HPI Patient seen for bilateral knee pain that started several years ago and has been gradually getting worse over the past several years. The right knee pain is worse than the left the pain. The pain gets worse with use and at the end of the day. Needs better with rest. Patient takes exercise Tylenol to help with pain. She also uses Percocet for chronic back pain. She has difficulty ambulating and climbing stairs. The pain is located on the medial and lateral aspects of the knee joint. Is no radiation of the pain.   Review of Systems  Constitutional: Negative for fever, chills and fatigue.  Musculoskeletal: Positive for back pain, arthralgias and gait problem. Negative for myalgias and joint swelling.  Skin: Positive for color change.  Neurological: Negative for dizziness, tremors, weakness, light-headedness, numbness and headaches.       Objective:   Physical Exam  Constitutional: She is oriented to person, place, and time. She appears well-developed and well-nourished.  Musculoskeletal:       Pain along the medial and lateral aspects of the meniscus bilaterally. There is crepitus in the right knee joint with flexion and extension. Knee extension is to 0 and knee flexion is to 100.  Neurological: She is alert and oriented to person, place, and time. She has normal reflexes. Coordination normal.  Skin: Skin is warm and dry.       Chronic venous stasis changes the lower extremities bilaterally.          Assessment & Plan:  #1 osteoarthritis of the right knee. I discussed at length with the patient management options including NSAIDs, steroid injections, knee replacement. Due to the patient's age and hasn't to refer the patient for orthopedic evaluation for a replacement due to the likely need for revision at a future date. She has not had a trial of steroid injections. I discussed the  risks and benefits of receiving steroid injections, which we did today. Consent was signed and is on the chart.  The skin was cleansed with Betadine. Kenalog 20 mg and bupivacaine 0.25% 2 mL was injected into the right knee joint.  There was minimal bleeding noted. A Band-Aid was placed over the entry site. Followup in 2 weeks.

## 2011-04-25 ENCOUNTER — Ambulatory Visit (INDEPENDENT_AMBULATORY_CARE_PROVIDER_SITE_OTHER): Payer: Self-pay | Admitting: Family Medicine

## 2011-04-25 ENCOUNTER — Encounter: Payer: Self-pay | Admitting: Family Medicine

## 2011-04-25 VITALS — BP 175/80 | HR 73 | Temp 97.6°F | Ht 69.0 in | Wt 326.0 lb

## 2011-04-25 DIAGNOSIS — L559 Sunburn, unspecified: Secondary | ICD-10-CM | POA: Insufficient documentation

## 2011-04-25 NOTE — Patient Instructions (Signed)
Patient given verbal instructions

## 2011-04-25 NOTE — Assessment & Plan Note (Signed)
Patient sunburn this rash seems to be only in the sun exposed areas. Patient was on doxycycline and not wear any sunscreen. Discussed that at this point and should continue to improve on its own she wear sunscreen when she is out side and it should resolve in the next 2 days patient given red flags and when to seek medical attention Patient will be following up with her primary care provider in the next week for unrelated problems

## 2011-04-25 NOTE — Progress Notes (Signed)
  Subjective:    Patient ID: TANAYIA WAHLQUIST, female    DOB: 1956-01-10, 55 y.o.   MRN: [id number]  HPI 55 year old female coming in with rash. Patient states it has been around for approximately one day states it is only on her forearms as well as her chest. Denies that the seems to be spreading but may have some on her face. Patient has not had any new medication other than a steroid injection and fairly recently did get off her doxycycline that she was being treated for a cellulitis. Patient denies any fevers or chills denies any sick contacts denies any new animals or any new detergents or anything else in the environment that it causes rash. Patient states it does not itch but does seem to be warm and kind of hurt.   Review of Systems Patient denies fever, chills, shortness of breath, chest pain, trouble swallowing, or abdominal pain    Objective:   Physical Exam General: Well-appearing obese female Skin: Patient does have a macular type rash on the dorsal aspect of the forearm as well as her upper chest in the sun exposed areas of her skin. Patient has not had any rash on her back stomach or any non-sun exposed skin areas       Assessment & Plan:

## 2011-05-08 ENCOUNTER — Ambulatory Visit (INDEPENDENT_AMBULATORY_CARE_PROVIDER_SITE_OTHER): Payer: Self-pay | Admitting: Family Medicine

## 2011-05-08 ENCOUNTER — Encounter: Payer: Self-pay | Admitting: Family Medicine

## 2011-05-08 DIAGNOSIS — E11622 Type 2 diabetes mellitus with other skin ulcer: Secondary | ICD-10-CM

## 2011-05-08 DIAGNOSIS — L97909 Non-pressure chronic ulcer of unspecified part of unspecified lower leg with unspecified severity: Secondary | ICD-10-CM

## 2011-05-08 DIAGNOSIS — E1149 Type 2 diabetes mellitus with other diabetic neurological complication: Secondary | ICD-10-CM

## 2011-05-08 DIAGNOSIS — M545 Low back pain: Secondary | ICD-10-CM

## 2011-05-08 DIAGNOSIS — M1710 Unilateral primary osteoarthritis, unspecified knee: Secondary | ICD-10-CM

## 2011-05-08 DIAGNOSIS — E1169 Type 2 diabetes mellitus with other specified complication: Secondary | ICD-10-CM

## 2011-05-08 NOTE — Assessment & Plan Note (Signed)
Encourage patient to check blood sugars twice daily rotating times a week she checks her blood sugars. Continue taking metformin and insulin. Bring logbook to next appointment.

## 2011-05-08 NOTE — Patient Instructions (Signed)
Knee Injection Joint injections are shots. Your caregiver will place a needle into your knee joint. The needle is used to put medicine into the joint. These shots can be used to help treat different painful knee conditions such as osteoarthritis, bursitis, local flare-ups of rheumatoid arthritis, and pseudogout. Anti-inflammatory medicines such as corticosteroids and anesthetics are the most common medicines used for joint and soft tissue injections.  PROCEDURE  The skin over the kneecap will be cleaned with an antiseptic solution.   Your caregiver will inject a small amount of a local anesthetic (a medicine like Novocaine) just under the skin in the area that was cleaned.   After the area becomes numb, a second injection is done. This second injection usually includes an anesthetic and an anti-inflammatory medicine called a steroid or cortisone. The needle is carefully placed in between the kneecap and the knee, and the medicine is injected into the joint space.   After the injection is done, the needle is removed. Your caregiver may place a bandage over the injection site. The whole procedure takes no more than a couple of minutes.  BEFORE THE PROCEDURE  Wash all of the skin around the entire knee area. Try to remove any loose, scaling skin. There is no other specific preparation necessary unless advised otherwise by your caregiver. LET YOUR CAREGIVER KNOW ABOUT:   Allergies.   Medications taken including herbs, eye drops, over the counter medications, and creams.   Use of steroids (by mouth or creams).   Possible pregnancy, if applicable.   Previous problems with anesthetics or Novocaine.   History of blood clots (thrombophlebitis).   History of bleeding or blood problems.   Previous surgery.   Other health problems.  RISKS AND COMPLICATIONS Side effects from cortisone shots are rare. They include:   Slight bruising of the skin.   Shrinkage of the normal fatty tissue under the  skin where the shot was given.   Increase in pain after the shot.   Infection.   Weakening of tendons or tendon rupture.   Allergic reaction to the medicine.   Diabetics may have a temporary increase in their blood sugar after a shot.   Cortisone can temporarily weaken the immune system. While receiving these shots, you should not get certain vaccines. Also, avoid contact with anyone who has chickenpox or measles. Especially if you have never had these diseases or have not been previously immunized. Your immune system may not be strong enough to fight off the infection while the cortisone is in your system.  AFTER THE PROCEDURE   You can go home after the procedure.   You may need to put ice on the joint 15 to 20 minutes every 3 or 4 hours until the pain goes away.   You may need to put an elastic bandage on the joint.  HOME CARE INSTRUCTIONS   Only take over-the-counter or prescription medicines for pain, discomfort, or fever as directed by your caregiver.   You should avoid stressing the joint. Unless advised otherwise, avoid activities that put a lot of pressure on a knee joint, such as:   Jogging.   Bicycling.   Recreational climbing.   Hiking.   Laying down and elevating the leg/knee above the level of your heart can help to minimize swelling.  SEEK MEDICAL CARE IF:   You have repeated or worsening swelling.   There is drainage from the puncture area.   You develop red streaking that extends above or below the  site where the needle was inserted.  SEEK IMMEDIATE MEDICAL CARE IF:   You develop a fever.   You have pain that gets worse even though you are taking pain medicine.   The area is red and warm, and you have trouble moving the joint.  MAKE SURE YOU:   Understand these instructions.   Will watch your condition.   Will get help right away if you are not doing well or get worse.  Document Released: 09/01/2006 Document Revised: 02/20/2011 Document  Reviewed: 05/29/2007 [facility] Patient Information 2012 [place].

## 2011-05-08 NOTE — Progress Notes (Signed)
  Subjective:    Patient ID: [name], female    DOB: 01/17/1956, 55 y.o.   MRN: [id number]  HPI [name] returns to clinic for followup of right knee pain, back pain, diabetes. The patient reports great results with the right knee injection that occurred last appointment. She reports no difficulties or complications. Her pain in her knee is 0/10 and reports being able to ambulate without difficulty. She also rates her back pain had a 0-1/10 today with the pain being 4/10 at its worst since the knee injection. She does have continued left knee pain which she rates 6- 7/10.  For her diabetes, she reports improved fasting blood sugars in the 60s to 80s. She did not her logbook to the appointment today. She reports no hypoglycemic episodes. She denies polyuria, polydipsia, or blurred vision.  The patient has a new wound on her right lower extremity that started yesterday after rubbing the area within. The wound has continued to weep. It is nonpainful, red, or warm. Has not changed since yesterday.  Review of Systems  Constitutional: Negative for fever and fatigue.  Respiratory: Negative for shortness of breath.   Cardiovascular: Negative for chest pain and leg swelling.  Gastrointestinal: Negative for nausea, vomiting and diarrhea.  Musculoskeletal: Negative for joint swelling and gait problem.  Neurological: Negative for dizziness and headaches.       Objective:   Physical Exam  Constitutional: She is oriented to person, place, and time. She appears well-developed and well-nourished.  HENT:  Head: Normocephalic and atraumatic.  Cardiovascular: Normal rate, regular rhythm and normal heart sounds.   Pulmonary/Chest: Effort normal and breath sounds normal. No respiratory distress. She has no wheezes. She has no rales. She exhibits no tenderness.  Abdominal: Soft. Bowel sounds are normal. She exhibits no distension and no mass. There is no tenderness. There is no rebound and no guarding.      obese  Musculoskeletal: She exhibits edema.       Tenderness to the left medial and lateral knee joint line. Mild crepitus to flexion and extension.  Neurological: She is alert and oriented to person, place, and time.  Skin: Skin is warm and dry. No rash noted. No erythema. No pallor.       A chronic venous stasis changes the lower extremity bilaterally. There is a 0.5 cm open ulcer on her right lower extremity.          Assessment & Plan:

## 2011-05-08 NOTE — Assessment & Plan Note (Signed)
New right lower leg wound.  Due to patient's history of wound ulcers, will refer the patient to wound care.  Appointment was made for the patient on November 29.

## 2011-05-08 NOTE — Assessment & Plan Note (Signed)
Left knee injected today with Kenalog 20 mg (2 mL) and bupivacaine 0.25% 2 mL after the skin was cleaned with Betadine. Consent was signed and on the chart. Patient tolerated the procedure without difficulty.

## 2011-05-08 NOTE — Assessment & Plan Note (Signed)
Improved following right knee injection, likely due to improved body mechanics with ambulation. We'll hold on pain management referral of less become symptomatic again.

## 2011-05-20 ENCOUNTER — Ambulatory Visit (INDEPENDENT_AMBULATORY_CARE_PROVIDER_SITE_OTHER): Payer: Self-pay | Admitting: Family Medicine

## 2011-05-20 ENCOUNTER — Encounter: Payer: Self-pay | Admitting: Family Medicine

## 2011-05-20 DIAGNOSIS — L02219 Cutaneous abscess of trunk, unspecified: Secondary | ICD-10-CM | POA: Insufficient documentation

## 2011-05-20 DIAGNOSIS — L03019 Cellulitis of unspecified finger: Secondary | ICD-10-CM

## 2011-05-20 DIAGNOSIS — IMO0001 Reserved for inherently not codable concepts without codable children: Secondary | ICD-10-CM | POA: Insufficient documentation

## 2011-05-20 MED ORDER — DOXYCYCLINE HYCLATE 100 MG PO TABS: 100.0000 mg | ORAL_TABLET | Freq: Two times a day (BID) | ORAL | Status: AC

## 2011-05-20 NOTE — Assessment & Plan Note (Signed)
Drained today 11 blade. Gave prescription for doxycycline for wound on abdomen.

## 2011-05-20 NOTE — Assessment & Plan Note (Signed)
Start doxycycline today. See back on Thursday or Friday for recheck. 2-3 times a day warm compresses.

## 2011-05-20 NOTE — Patient Instructions (Signed)
Please make an appointment to be seen on Thursday for recheck of your skin infection. Please use warm compresses to 3 times a day on both your abdomen and your finger. Please put bacitracin on your finger until it heals and put bacitracin on abdomen if the wound opens. I have written for doxycycline to take for 14 days. Please try not to pick on your skin because this can allow in bacteria. You can make an appointment for a future date for skin tag removal

## 2011-05-20 NOTE — Progress Notes (Signed)
  Subjective:    Patient ID: [name], female    DOB: September 30, 1955, 55 y.o.   MRN: [id number]  HPI Abscess or cellulitis abdomen-patient with one week of swelling and hardness in a focal area of the abdomen. No fevers. Patient with significant history of MRSA infection in past. Patient was on doxycycline and ended the antibiotic course one month ago. Patient reports some serosanguineous drainage with some minimal pus 2 or 3 days ago. She thinks that the redness has increased slightly however it is not increasing very fast. She says that the area of induration has decreased significantly in size.  Patient reports frequent picking at skin. She also reports very frequent handwashing. Paronychia right hand-1 week of pain and swelling on the tip of the third finger of left hand. No drainage no swelling. She has had similar problems before which have help with draining.  Review of Systems     Objective:   Physical Exam  Vital signs reviewed General appearance - alert, well appearing, and in no distress and oriented to person, place, and time  Abdomen-there is an area on the left side with 2 cm of induration and 5 cm of redness with some skin peeling. No fluctuance.  Hands-Third finger on left hand with paronychia. Significant swelling and fluctuance in this area. Very dry skin on both hands.     Assessment & Plan:  Pt was consented for procedure including risks of bleeding and infection.    The area was cleaned with betadine and the incision was done with an 11 blade needle underneath the cuticle lifting up the cuticle. Pus was drained..   A gauze dressing was applied.

## 2011-05-22 ENCOUNTER — Other Ambulatory Visit: Payer: Self-pay | Admitting: Family Medicine

## 2011-05-22 DIAGNOSIS — F329 Major depressive disorder, single episode, unspecified: Secondary | ICD-10-CM

## 2011-05-22 MED ORDER — VENLAFAXINE HCL ER 75 MG PO CP24: ORAL_CAPSULE | ORAL | Status: DC

## 2011-05-23 ENCOUNTER — Encounter (HOSPITAL_BASED_OUTPATIENT_CLINIC_OR_DEPARTMENT_OTHER): Payer: Self-pay | Attending: Internal Medicine

## 2011-05-23 DIAGNOSIS — Z8614 Personal history of Methicillin resistant Staphylococcus aureus infection: Secondary | ICD-10-CM | POA: Insufficient documentation

## 2011-05-23 DIAGNOSIS — I2510 Atherosclerotic heart disease of native coronary artery without angina pectoris: Secondary | ICD-10-CM | POA: Insufficient documentation

## 2011-05-23 DIAGNOSIS — E119 Type 2 diabetes mellitus without complications: Secondary | ICD-10-CM | POA: Insufficient documentation

## 2011-05-23 DIAGNOSIS — X58XXXA Exposure to other specified factors, initial encounter: Secondary | ICD-10-CM | POA: Insufficient documentation

## 2011-05-23 DIAGNOSIS — I872 Venous insufficiency (chronic) (peripheral): Secondary | ICD-10-CM | POA: Insufficient documentation

## 2011-05-23 DIAGNOSIS — I10 Essential (primary) hypertension: Secondary | ICD-10-CM | POA: Insufficient documentation

## 2011-05-23 DIAGNOSIS — S81009A Unspecified open wound, unspecified knee, initial encounter: Secondary | ICD-10-CM | POA: Insufficient documentation

## 2011-05-23 DIAGNOSIS — L02419 Cutaneous abscess of limb, unspecified: Secondary | ICD-10-CM | POA: Insufficient documentation

## 2011-05-23 NOTE — Progress Notes (Signed)
[facility]  NAME:  [name], [name]                 ACCOUNT NO.:  [id number]  MEDICAL RECORD NO.:  [id number]      DATE OF BIRTH:  12-06-1955  PHYSICIAN:  Ricard Dillon, M.D. VISIT DATE:  05/23/2011                                  OFFICE VISIT   [name] is a patient we know fairly well from a stay in our clinic from May 2012 through August 2012.  At that point, she had a venous stasis wound on her right leg.  This eventually healed.  This initially had been complicated by a cellulitis around the area.  The patient is a type 2 diabetic.  She has nonocclusive coronary artery disease, hypertension.  She had an MRSA infection in her right arm that required surgical I and D in 2009.  She has had previous right total knee replacement.  On this occasion, she arrives with two problems, firstly she had a recurrent wound on the right anterior leg.  She has also recently been treated at her primary [facility] office for an infection on her left anterior abdominal wall.  She is here for our review of both of these.  PHYSICAL EXAMINATION:  Her temperature was 98.2, pulse 69, respirations 18, blood pressure 154/83.  Her blood glucose is 283.  The wound over the right anterior lower extremity measures 1.4 x 1 x 0.1.  This does not appear to be in need of debridement, however, there was surrounding erythema here, some of this is her venous stasis, probably most of it, although I could not exactly rule out cellulitis.  She is, as mentioned, on doxycycline already.  The other area was on the left anterior abdomen in the flank area.  This was an indurated area that I felt might have an underlying abscess.  I anesthetized the skin with lidocaine injectable and made a small incision, however, there was only a mild amount of sanguineous discharge, nevertheless, I did culture this area.  IMPRESSION:  Recurrent venous stasis wound on the right leg.  She is already on  doxycycline.  I did not re-culture this area.  We applied foam TCA to the leg and put her back in a Unna boot with a Kerlix, Coban wrap.  To the left abdominal wall, we simply applied a dry dressing. This should be allowed to drain.  As mentioned, I did do a deep culture of the left abdominal abscess.  However, there was really not a lot to obtain which was surprising.  She is to continue on doxycycline.  We will review this again in a week.          ______________________________ Ricard Dillon, M.D.     MGR/MEDQ  D:  05/23/2011  T:  05/23/2011  Job:  [id number]

## 2011-05-24 ENCOUNTER — Encounter: Payer: Self-pay | Admitting: Family Medicine

## 2011-05-24 ENCOUNTER — Ambulatory Visit (INDEPENDENT_AMBULATORY_CARE_PROVIDER_SITE_OTHER): Payer: Self-pay | Admitting: Family Medicine

## 2011-05-24 VITALS — BP 156/81 | HR 82 | Temp 97.7°F | Ht 69.0 in | Wt 322.0 lb

## 2011-05-24 DIAGNOSIS — L03319 Cellulitis of trunk, unspecified: Secondary | ICD-10-CM

## 2011-05-24 DIAGNOSIS — L02219 Cutaneous abscess of trunk, unspecified: Secondary | ICD-10-CM

## 2011-05-24 NOTE — Assessment & Plan Note (Signed)
Managed by wound care

## 2011-05-24 NOTE — Progress Notes (Signed)
  Subjective:    Patient ID: [name], female    DOB: 1955/07/15, 55 y.o.   MRN: [id number]  HPI  Patient does not desire a full visit today. She comes in to tell me that she is now being followed by the wound care center for her infections. She is taking the doxycycline but would like a wound care center to manage her from this point on. She will return if she needs [place] for these wounds.  Review of Systems     Objective:   Physical Exam        Assessment & Plan:

## 2011-05-27 ENCOUNTER — Encounter (HOSPITAL_BASED_OUTPATIENT_CLINIC_OR_DEPARTMENT_OTHER): Payer: Self-pay | Attending: Internal Medicine

## 2011-05-27 DIAGNOSIS — I872 Venous insufficiency (chronic) (peripheral): Secondary | ICD-10-CM | POA: Insufficient documentation

## 2011-05-27 DIAGNOSIS — E119 Type 2 diabetes mellitus without complications: Secondary | ICD-10-CM | POA: Insufficient documentation

## 2011-05-27 DIAGNOSIS — Z79899 Other long term (current) drug therapy: Secondary | ICD-10-CM | POA: Insufficient documentation

## 2011-05-27 DIAGNOSIS — L97809 Non-pressure chronic ulcer of other part of unspecified lower leg with unspecified severity: Secondary | ICD-10-CM | POA: Insufficient documentation

## 2011-05-27 DIAGNOSIS — I739 Peripheral vascular disease, unspecified: Secondary | ICD-10-CM | POA: Insufficient documentation

## 2011-05-27 DIAGNOSIS — Z8614 Personal history of Methicillin resistant Staphylococcus aureus infection: Secondary | ICD-10-CM | POA: Insufficient documentation

## 2011-05-27 DIAGNOSIS — I2510 Atherosclerotic heart disease of native coronary artery without angina pectoris: Secondary | ICD-10-CM | POA: Insufficient documentation

## 2011-05-27 DIAGNOSIS — I10 Essential (primary) hypertension: Secondary | ICD-10-CM | POA: Insufficient documentation

## 2011-05-27 DIAGNOSIS — L02219 Cutaneous abscess of trunk, unspecified: Secondary | ICD-10-CM | POA: Insufficient documentation

## 2011-05-27 DIAGNOSIS — L03319 Cellulitis of trunk, unspecified: Secondary | ICD-10-CM | POA: Insufficient documentation

## 2011-05-29 ENCOUNTER — Other Ambulatory Visit: Payer: Self-pay | Admitting: Family Medicine

## 2011-05-29 DIAGNOSIS — I10 Essential (primary) hypertension: Secondary | ICD-10-CM

## 2011-05-29 MED ORDER — METOPROLOL SUCCINATE ER 50 MG PO TB24: 50.0000 mg | ORAL_TABLET | Freq: Every day | ORAL | Status: DC

## 2011-06-10 ENCOUNTER — Encounter: Payer: Self-pay | Admitting: Family Medicine

## 2011-06-10 ENCOUNTER — Ambulatory Visit (INDEPENDENT_AMBULATORY_CARE_PROVIDER_SITE_OTHER): Payer: Self-pay | Admitting: Family Medicine

## 2011-06-10 VITALS — BP 129/71 | HR 100 | Ht 69.0 in | Wt 324.0 lb

## 2011-06-10 DIAGNOSIS — K062 Gingival and edentulous alveolar ridge lesions associated with trauma: Secondary | ICD-10-CM

## 2011-06-10 DIAGNOSIS — K1370 Unspecified lesions of oral mucosa: Secondary | ICD-10-CM

## 2011-06-10 DIAGNOSIS — Z9181 History of falling: Secondary | ICD-10-CM

## 2011-06-10 DIAGNOSIS — R296 Repeated falls: Secondary | ICD-10-CM

## 2011-06-10 MED ORDER — MAGIC MOUTHWASH W/LIDOCAINE: 5.0000 mL | Freq: Three times a day (TID) | ORAL | Status: DC | PRN

## 2011-06-10 NOTE — Progress Notes (Signed)
  Subjective:    Patient ID: [name], female    DOB: 1956/04/14, 55 y.o.   MRN: [id number]  HPI Frequent falls x2-3 months: In the past 2-3 months patient has fallen 5-6 times. Sometimes experiences a little the dizziness prior fall but not always. A couple time she got up quickly and lost balance and fell. Last fall was in a parking lot and all of a sudden she lost balance and fell. No visual changes. No vision loss. No loss of consciousness. No blacking out. No palpitations. No chest pain. No shortness of breath. No vertigo. Patient has severe neuropathy. On arriving for this. Very rare low blood sugar. Blood sugars currently in the 200s. Has severe knee arthritis and chronic back problems-per patient report.  Ill fitting dentures: After visit over patient return to care area requesting Magic mouthwash. States that dentures are ill fitting and are sometimes uncomfortable. Has had Magic mouthwash in past and this helps. States she does not have money at this time to get dentures refitted. No ulcers. No lesions. Some redness at times at areas of irritation.  In the hall of clinic at time of request, did not remove dentures for exam.  Review of Systems As per above    Objective:   Physical Exam  Constitutional: She is oriented to person, place, and time. She appears well-developed and well-nourished.  HENT:  Head: Normocephalic and atraumatic.       Dental/gum exam not performed  Eyes: Right eye exhibits no discharge. Left eye exhibits no discharge.  Neck: Normal range of motion.  Cardiovascular: Normal rate, regular rhythm and normal heart sounds.   No murmur heard. Pulmonary/Chest: Effort normal and breath sounds normal. No respiratory distress. She has no wheezes.  Musculoskeletal: She exhibits no edema.       Strength 5 out of 5 in upper and lower extremities  Equal bilateral  Decrease sensation in feet bilateral  Neurological: She is alert and oriented to person, place, and time.  She has normal reflexes. She displays normal reflexes. She exhibits normal muscle tone. Coordination normal.       Cranial nerve exam within normal limits-except for droop of right side of mouth when smiling.-Patient has history of Bell's palsy states this is her baseline.  Gait slowed-walks with cane.  Skin: No rash noted.  Psychiatric: She has a normal mood and affect. Her behavior is normal.          Assessment & Plan:

## 2011-06-10 NOTE — Patient Instructions (Signed)
I think your falls are due to neuropathy and lack of balance associated with your neuropathy.  I feel that physical therapy could really benefit you.  My office will call you with your physical therapy appointment.  Return in 1 month or sooner if needed to follow up with primary care md regarding this issue.

## 2011-06-12 DIAGNOSIS — K062 Gingival and edentulous alveolar ridge lesions associated with trauma: Secondary | ICD-10-CM | POA: Insufficient documentation

## 2011-06-12 DIAGNOSIS — Z9181 History of falling: Secondary | ICD-10-CM | POA: Insufficient documentation

## 2011-06-12 NOTE — Assessment & Plan Note (Signed)
I feel that her falls are most likely related to severe neuropathy in her feet bilateral-causing her to have like a per perception. Will refer her to physical therapy for evaluation and treatment for balance issues. Also patient may need to have assistive devices fitted for her. Patient to return in one to 2 months for followup. Will call patient with appointment for physical therapy.

## 2011-06-12 NOTE — Assessment & Plan Note (Signed)
Gave patient prescription for Magic mouthwash. Patient to use as needed. Encouraged patient to go see dentist since irritation can lead to ulcers. Patient states she will get an appointment with the provider who gave her the dentures initially--so she can get these refitted.

## 2011-06-20 ENCOUNTER — Encounter (HOSPITAL_BASED_OUTPATIENT_CLINIC_OR_DEPARTMENT_OTHER): Payer: Self-pay

## 2011-06-21 ENCOUNTER — Encounter: Payer: Self-pay | Admitting: Rehabilitative and Restorative Service Providers"

## 2011-06-27 ENCOUNTER — Encounter (HOSPITAL_BASED_OUTPATIENT_CLINIC_OR_DEPARTMENT_OTHER): Payer: Self-pay | Attending: Internal Medicine

## 2011-06-27 DIAGNOSIS — G589 Mononeuropathy, unspecified: Secondary | ICD-10-CM | POA: Insufficient documentation

## 2011-06-27 DIAGNOSIS — I10 Essential (primary) hypertension: Secondary | ICD-10-CM | POA: Insufficient documentation

## 2011-06-27 DIAGNOSIS — I2510 Atherosclerotic heart disease of native coronary artery without angina pectoris: Secondary | ICD-10-CM | POA: Insufficient documentation

## 2011-06-27 DIAGNOSIS — I739 Peripheral vascular disease, unspecified: Secondary | ICD-10-CM | POA: Insufficient documentation

## 2011-06-27 DIAGNOSIS — Z7982 Long term (current) use of aspirin: Secondary | ICD-10-CM | POA: Insufficient documentation

## 2011-06-27 DIAGNOSIS — E119 Type 2 diabetes mellitus without complications: Secondary | ICD-10-CM | POA: Insufficient documentation

## 2011-06-27 DIAGNOSIS — Z79899 Other long term (current) drug therapy: Secondary | ICD-10-CM | POA: Insufficient documentation

## 2011-06-27 DIAGNOSIS — I872 Venous insufficiency (chronic) (peripheral): Secondary | ICD-10-CM | POA: Insufficient documentation

## 2011-06-27 DIAGNOSIS — L97809 Non-pressure chronic ulcer of other part of unspecified lower leg with unspecified severity: Secondary | ICD-10-CM | POA: Insufficient documentation

## 2011-07-02 ENCOUNTER — Ambulatory Visit: Payer: Self-pay | Attending: Family Medicine | Admitting: Physical Therapy

## 2011-07-02 DIAGNOSIS — IMO0001 Reserved for inherently not codable concepts without codable children: Secondary | ICD-10-CM | POA: Insufficient documentation

## 2011-07-02 DIAGNOSIS — R269 Unspecified abnormalities of gait and mobility: Secondary | ICD-10-CM | POA: Insufficient documentation

## 2011-07-04 ENCOUNTER — Ambulatory Visit: Payer: Self-pay | Admitting: Physical Therapy

## 2011-07-09 ENCOUNTER — Ambulatory Visit: Payer: Self-pay | Admitting: Physical Therapy

## 2011-07-12 ENCOUNTER — Ambulatory Visit: Payer: Self-pay | Admitting: Family Medicine

## 2011-07-12 ENCOUNTER — Ambulatory Visit: Payer: Self-pay | Admitting: Physical Therapy

## 2011-07-16 ENCOUNTER — Ambulatory Visit: Payer: Self-pay | Admitting: Physical Therapy

## 2011-07-17 ENCOUNTER — Other Ambulatory Visit: Payer: Self-pay | Admitting: Family Medicine

## 2011-07-17 DIAGNOSIS — G629 Polyneuropathy, unspecified: Secondary | ICD-10-CM

## 2011-07-17 DIAGNOSIS — E1149 Type 2 diabetes mellitus with other diabetic neurological complication: Secondary | ICD-10-CM

## 2011-07-17 MED ORDER — GABAPENTIN 300 MG PO CAPS: ORAL_CAPSULE | ORAL | Status: DC

## 2011-07-17 NOTE — Telephone Encounter (Signed)
Patient has appointment with Dr. Nehemiah Settle 01/29. Consulted with Dr. Doreene Nest and  She advises may send in enough to last until that appointment. Message left on voicemail. [facility]

## 2011-07-17 NOTE — Telephone Encounter (Signed)
[name] is calling about the refill request for Neurontin that [facility].  She has been out of them for 3 weeks now.  They were giving her samples, but they cannot do that anymore.

## 2011-07-18 ENCOUNTER — Ambulatory Visit: Payer: Self-pay | Admitting: Physical Therapy

## 2011-07-22 ENCOUNTER — Ambulatory Visit: Payer: Self-pay | Admitting: Physical Therapy

## 2011-07-23 ENCOUNTER — Ambulatory Visit: Payer: Self-pay | Admitting: Family Medicine

## 2011-07-25 ENCOUNTER — Ambulatory Visit: Payer: Self-pay | Admitting: Physical Therapy

## 2011-07-29 ENCOUNTER — Ambulatory Visit: Payer: Self-pay | Attending: Family Medicine | Admitting: Physical Therapy

## 2011-07-29 DIAGNOSIS — R269 Unspecified abnormalities of gait and mobility: Secondary | ICD-10-CM | POA: Insufficient documentation

## 2011-07-29 DIAGNOSIS — IMO0001 Reserved for inherently not codable concepts without codable children: Secondary | ICD-10-CM | POA: Insufficient documentation

## 2011-08-01 ENCOUNTER — Ambulatory Visit: Payer: Self-pay | Admitting: Physical Therapy

## 2011-08-01 ENCOUNTER — Encounter (HOSPITAL_BASED_OUTPATIENT_CLINIC_OR_DEPARTMENT_OTHER): Payer: Self-pay

## 2011-08-23 ENCOUNTER — Ambulatory Visit (INDEPENDENT_AMBULATORY_CARE_PROVIDER_SITE_OTHER): Payer: Self-pay | Admitting: Family Medicine

## 2011-08-23 ENCOUNTER — Encounter: Payer: Self-pay | Admitting: Family Medicine

## 2011-08-23 VITALS — BP 146/84 | HR 89 | Temp 98.8°F | Ht 69.0 in | Wt 312.2 lb

## 2011-08-23 DIAGNOSIS — J069 Acute upper respiratory infection, unspecified: Secondary | ICD-10-CM | POA: Insufficient documentation

## 2011-08-23 DIAGNOSIS — J029 Acute pharyngitis, unspecified: Secondary | ICD-10-CM

## 2011-08-23 DIAGNOSIS — K069 Disorder of gingiva and edentulous alveolar ridge, unspecified: Secondary | ICD-10-CM

## 2011-08-23 DIAGNOSIS — K056 Periodontal disease, unspecified: Secondary | ICD-10-CM | POA: Insufficient documentation

## 2011-08-23 NOTE — Progress Notes (Signed)
  Subjective:    Patient ID: [name], female    DOB: 1956-03-23, 56 y.o.   MRN: [id number]  HPI 56 yo here for 1-2 days of cough and sore throat.  Very concerned because her husband is admitted to he hospital with pneumonia. NO dyspnea, no fever but felt warm, no diarrhea or emesis.  Mouth sores:  Also notes several months of sore lower gums where he dentures rub  I have reviewed patient's  PMH, FH, and Social history and Medications as related to this visit. No COPD, asthma.  Non smoker- quit 17 years ago Review of Systems See hpi    Objective:   Physical Exam  GEN: Alert & Oriented, No acute distress HEENT: [place]/AT. EOMI, PERRLA, no conjunctival injection or scleral icterus.  Right tympanic membranes intact without erythema or effusion, left obscurred by cerumen  .  Nares without edema or rhinorrhea.  Oropharynx is without erythema or exudates.  No anterior or posterior cervical lymphadenopathy.   Oral mucosa without any obvious ulcers, no signs of infection. Edentulous. CV:  Regular Rate & Rhythm, no murmur Respiratory:  Normal work of breathing, CTAB        Assessment & Plan:

## 2011-08-23 NOTE — Assessment & Plan Note (Signed)
No obvious ulcer or abrasion or infection.  Advised OTC creams for canker sores toh elp with pain and cure is really to get a better fitting or permanent dental prosthesis.

## 2011-08-23 NOTE — Patient Instructions (Signed)

## 2011-08-23 NOTE — Assessment & Plan Note (Signed)
Neg strep.  Minimal symptoms at this point, advised supportive care, discussed red flags for return

## 2011-08-26 ENCOUNTER — Ambulatory Visit (INDEPENDENT_AMBULATORY_CARE_PROVIDER_SITE_OTHER): Payer: Self-pay | Admitting: Family Medicine

## 2011-08-26 ENCOUNTER — Encounter: Payer: Self-pay | Admitting: Family Medicine

## 2011-08-26 VITALS — BP 159/79 | HR 78 | Temp 97.8°F | Ht 69.0 in | Wt 315.0 lb

## 2011-08-26 DIAGNOSIS — J069 Acute upper respiratory infection, unspecified: Secondary | ICD-10-CM

## 2011-08-26 MED ORDER — BENZONATATE 100 MG PO CAPS: 100.0000 mg | ORAL_CAPSULE | Freq: Four times a day (QID) | ORAL | Status: DC | PRN

## 2011-08-26 NOTE — Progress Notes (Signed)
  Subjective:    Patient ID: [name], female    DOB: May 12, 1956, 56 y.o.   MRN: [id number]  HPI Pt is here for eval of 1 week of cough and subjective fevers.  No fevers in the last 3 days.  Cough is still productive and she has a burning in her chest when she coughs.  She is able to walk around without SOB.  Husband is in the hospital with PNA.   She has some diarrhea but denies N/V.    Review of Systems See above    Objective:   Physical Exam Vital signs reviewed General appearance - alert, well appearing, and in no distress and oriented to person, place, and time Heart - normal rate, regular rhythm, normal S1, S2, no murmurs, rubs, clicks or gallops Lungs- no wheeze, CTAB HEENT- nose- some clear drainage present.         Assessment & Plan:

## 2011-08-26 NOTE — Assessment & Plan Note (Signed)
Continued viral cough. Recommend Mucinex, Tessalon Perles, lots of fluids. To return if gets another fever or feels worse.

## 2011-08-26 NOTE — Patient Instructions (Signed)
I have sent in [facility] for you. These will help with your cough Please drink lots of warm fluids, try honey for the cough, and take Tylenol for discomfort You can also try Mucinex to help break it up

## 2011-09-11 ENCOUNTER — Telehealth: Payer: Self-pay | Admitting: Family Medicine

## 2011-09-11 NOTE — Telephone Encounter (Signed)
Pt is taking neurontin 3 pills 3xtimes daily and didn't get enough to last her a whole month.  Needs at least 270 pills for the month. [facility]

## 2011-09-11 NOTE — Telephone Encounter (Signed)
Left message on voicemail to call our office back.  Nolene Ebbs, RN

## 2011-09-11 NOTE — Telephone Encounter (Addendum)
Spoke with patient. stares she has  a current RX for gabapentin from pharmacy but she only gets 10 days worth  at the time.  States she is taking 300 mg, three tabs three times daily. The rx that was given by Dr. Nehemiah Settle is for # 90 tabs.. Not on med list when this was sent in. Last we have noted is Jul 17, 2011 . She had appointment with Dr. Nehemiah Settle on 07/23/2011 and did not come in. Has been seen  twice  in March here by another provider for a problem  . Scheduled appointment to come in next week for appointment with PCP.

## 2011-09-11 NOTE — Telephone Encounter (Signed)
[place] and was told they have a current RX from Dr. Nehemiah Settle from 07/23/2011 for # 90 tabs  taking 3 tabs, three times daily.

## 2011-09-17 ENCOUNTER — Encounter: Payer: Self-pay | Admitting: Family Medicine

## 2011-09-17 ENCOUNTER — Emergency Department (HOSPITAL_COMMUNITY): Payer: Self-pay

## 2011-09-17 ENCOUNTER — Emergency Department (HOSPITAL_COMMUNITY)
Admission: EM | Admit: 2011-09-17 | Discharge: 2011-09-17 | Disposition: A | Discharge status: Home / Self Care | Payer: Self-pay | Attending: Emergency Medicine | Admitting: Emergency Medicine

## 2011-09-17 ENCOUNTER — Encounter (HOSPITAL_COMMUNITY): Payer: Self-pay

## 2011-09-17 DIAGNOSIS — I10 Essential (primary) hypertension: Secondary | ICD-10-CM | POA: Insufficient documentation

## 2011-09-17 DIAGNOSIS — W010XXA Fall on same level from slipping, tripping and stumbling without subsequent striking against object, initial encounter: Secondary | ICD-10-CM | POA: Insufficient documentation

## 2011-09-17 DIAGNOSIS — R51 Headache: Secondary | ICD-10-CM | POA: Insufficient documentation

## 2011-09-17 DIAGNOSIS — R220 Localized swelling, mass and lump, head: Secondary | ICD-10-CM | POA: Insufficient documentation

## 2011-09-17 DIAGNOSIS — S0083XA Contusion of other part of head, initial encounter: Secondary | ICD-10-CM

## 2011-09-17 DIAGNOSIS — E1142 Type 2 diabetes mellitus with diabetic polyneuropathy: Secondary | ICD-10-CM | POA: Insufficient documentation

## 2011-09-17 DIAGNOSIS — S1093XA Contusion of unspecified part of neck, initial encounter: Secondary | ICD-10-CM | POA: Insufficient documentation

## 2011-09-17 DIAGNOSIS — Z794 Long term (current) use of insulin: Secondary | ICD-10-CM | POA: Insufficient documentation

## 2011-09-17 DIAGNOSIS — S0003XA Contusion of scalp, initial encounter: Secondary | ICD-10-CM | POA: Insufficient documentation

## 2011-09-17 DIAGNOSIS — E1149 Type 2 diabetes mellitus with other diabetic neurological complication: Secondary | ICD-10-CM | POA: Insufficient documentation

## 2011-09-17 MED ORDER — HYDROCODONE-ACETAMINOPHEN 5-325 MG PO TABS
1.0000 | ORAL_TABLET | Freq: Once | ORAL | Status: AC
  Administered 2011-09-17: 1 via ORAL
  Filled 2011-09-17: qty 1

## 2011-09-17 MED ORDER — TETANUS-DIPHTH-ACELL PERTUSSIS 5-2.5-18.5 LF-MCG/0.5 IM SUSP
0.5000 mL | Freq: Once | INTRAMUSCULAR | Status: AC
  Administered 2011-09-17: 0.5 mL via INTRAMUSCULAR
  Filled 2011-09-17: qty 0.5

## 2011-09-17 MED ORDER — METFORMIN HCL 1000 MG PO TABS: 1000.0000 mg | ORAL_TABLET | Freq: Two times a day (BID) | ORAL | Status: DC

## 2011-09-17 NOTE — ED Notes (Signed)
Patient fell today hitting blunt object, patient with swelling to left eye and nose, patient with PERRL, and equal muscular eye movement bilatrerally

## 2011-09-17 NOTE — ED Provider Notes (Signed)
History     CSN: [id number]  Arrival date & time 09/17/11  1551   First MD Initiated Contact with Patient 09/17/11 1654      Chief Complaint  Patient presents with  . Fall    (Consider location/radiation/quality/duration/timing/severity/associated sxs/prior treatment) HPI Comments: Patient was at her daughter's house and notes that she has unsteadiness on her feet due to diabetic neuropathy at baseline and tripped and fell forward.  She hit her face on the side of the tub.  She did not loose consciousness.  She has swelling and pain around her left eye her nose and her left cheek.  She notes that she's on aspirin but no other blood thinners or anticoagulation agents.  She denies pain elsewhere.  No vision changes.  Patient is a 56 y.o. female presenting with fall. The history is provided by the patient. No language interpreter was used.  Fall The accident occurred 1 to 2 hours ago. The fall occurred while walking. She landed on a hard floor. The volume of blood lost was minimal. The point of impact was the head. The pain is present in the head. The pain is mild. She was ambulatory at the scene. There was no entrapment after the fall. There was no drug use involved in the accident. There was no alcohol use involved in the accident. Associated symptoms include headaches. Pertinent negatives include no fever, no abdominal pain, no nausea and no vomiting.    Past Medical History  Diagnosis Date  . GOITER, MULTINODULAR 05/13/2007  . DIABETES MELLITUS, WITH NEUROLOGICAL COMPLICATIONS 1/60/1093  . HYPERCHOLESTEROLEMIA 03/20/2010  . DYSLIPIDEMIA 05/13/2007  . DEPRESSION, CHRONIC 10/06/2007  . OBSTRUCTIVE SLEEP APNEA 06/12/2007    uses CPAP  . PERIPHERAL NEUROPATHY 01/12/2009  . HYPERTENSION 03/20/2010  . UNSPECIFIED VENOUS INSUFFICIENCY 05/28/2010  . PSORIASIS 05/28/2010  . BACK PAIN, LUMBAR 11/19/2007  . ANEMIA, PERNICIOUS, HX OF 05/13/2007  . ASYMPTOMATIC POSTMENOPAUSAL STATUS 02/18/2008  .  Hx of colonic polyps 12/04/2010  . NASH (nonalcoholic steatohepatitis)     Past Surgical History  Procedure Date  . Knee arthroscopy 2003    right  . Varicose vein surgery     Remotef  . Tonsillectomy   . Exercise myoview 01/24/2005  . Electrocardiogram 04/16/2006  . Rotator cuff repair 2008    right    Family History  Problem Relation Age of Onset  . Cancer Father     Colon Cancer  . Hyperlipidemia Father   . Hypertension Father   . Cirrhosis Father   . Colon cancer Father 35  . Cancer Sister     Colon Cancer  . Colon cancer Sister 12  . Aneurysm Mother   . Drug abuse Neg Hx   . Heart disease Neg Hx     CAD  . Stomach cancer Neg Hx     History  Substance Use Topics  . Smoking status: Former Smoker    Quit date: 08/06/1994  . Smokeless tobacco: Former Systems developer    Quit date: 10/18/1994  . Alcohol Use: No    OB History    Grav Para Term Preterm Abortions TAB SAB Ect Mult Living                  Review of Systems  Constitutional: Negative.  Negative for fever and chills.  HENT: Positive for facial swelling.   Eyes: Negative.  Negative for discharge and redness.  Respiratory: Negative.  Negative for cough and shortness of breath.   Cardiovascular: Negative.  Negative for chest pain.  Gastrointestinal: Negative.  Negative for nausea, vomiting, abdominal pain and diarrhea.  Genitourinary: Negative.  Negative for dysuria and vaginal discharge.  Musculoskeletal: Negative.  Negative for back pain.  Skin: Negative.  Negative for color change and rash.  Neurological: Positive for headaches. Negative for syncope.  Hematological: Negative.  Negative for adenopathy.  Psychiatric/Behavioral: Negative.  Negative for confusion.  All other systems reviewed and are negative.    Allergies  Keflex and Neosporin  Home Medications   Current Outpatient Rx  Name Route Sig Dispense Refill  . ASPIRIN 81 MG PO TABS Oral Take 81 mg by mouth daily.      . ATORVASTATIN CALCIUM  80 MG PO TABS Oral Take 80 mg by mouth daily.    Marland Kitchen BENZONATATE 100 MG PO CAPS Oral Take 100 mg by mouth every 6 (six) hours as needed. For cough    . ONETOUCH ULTRA MINI W/DEVICE KIT  Use glucometer for home glucose monitoring, one time daily 1 each 0  . OMEGA-3 FATTY ACIDS 1000 MG PO CAPS Oral Take 2 g by mouth daily.      Marland Kitchen GABAPENTIN 300 MG PO CAPS Oral Take 900 mg by mouth 3 (three) times daily. Take three tabs po TID for neuropathy    . INSULIN ASPART PROT & ASPART (70-30) 100 UNIT/ML [place] SUSP Subcutaneous Inject 230 Units into the skin daily before breakfast.     . INSULIN ASPART PROT & ASPART (70-30) 100 UNIT/ML [place] SUSP Subcutaneous Inject 150 Units into the skin at bedtime.    Marland Kitchen METFORMIN HCL 1000 MG PO TABS Oral Take 1,000 mg by mouth 2 (two) times daily with a meal.    . METOPROLOL SUCCINATE ER 50 MG PO TB24 Oral Take 50 mg by mouth daily.    . QUINAPRIL HCL 40 MG PO TABS Oral Take 40 mg by mouth daily.    . TORSEMIDE 20 MG PO TABS Oral Take 20 mg by mouth daily.    . TRIAMTERENE-HCTZ 37.5-25 MG PO TABS Oral Take 1 tablet by mouth daily.    . VENLAFAXINE HCL ER 75 MG PO CP24 Oral Take 225 mg by mouth daily.    . OXYCODONE-ACETAMINOPHEN 5-325 MG PO TABS Oral Take 1 tablet by mouth every 6 (six) hours as needed for pain. 60 tablet 0    BP 160/52  Pulse 68  Temp(Src) 98.3 F (36.8 C) (Oral)  Resp 18  SpO2 92%  Physical Exam  Nursing note and vitals reviewed. Constitutional: She is oriented to person, place, and time. She appears well-developed and well-nourished.  Non-toxic appearance. She does not have a sickly appearance.  HENT:  Head: Normocephalic.         Dried blood present in her nose and on her tongue.  No lacs to tongue on exam.  Pt wears dentures, so not tooth fractures.    Eyes: Conjunctivae, EOM and lids are normal. Pupils are equal, round, and reactive to light. No scleral icterus.  Neck: Trachea normal and normal range of motion. Neck supple.  Cardiovascular:  Normal rate, regular rhythm and normal heart sounds.   Pulmonary/Chest: Effort normal and breath sounds normal. No respiratory distress. She has no wheezes. She has no rales.  Abdominal: Soft. Normal appearance. There is no tenderness. There is no rebound, no guarding and no CVA tenderness.  Musculoskeletal: Normal range of motion.  Neurological: She is alert and oriented to person, place, and time. She has normal strength.  Skin: Skin  is warm, dry and intact. No rash noted.  Psychiatric: She has a normal mood and affect. Her behavior is normal. Judgment and thought content normal.    ED Course  Procedures (including critical care time)  Ct Head Wo Contrast  09/17/2011  *RADIOLOGY REPORT*  Clinical Data: Fall  CT HEAD WITHOUT CONTRAST,CT MAXILLOFACIAL WITHOUT CONTRAST  Technique:  Contiguous axial images were obtained from the base of the skull through the vertex without contrast.,Technique: Multidetector CT imaging of the maxillofacial structures was performed. Multiplanar CT image reconstructions were also generated.  Comparison: 09/15/2007  Findings: No skull fracture is noted.  Paranasal sinuses and mastoid air cells are unremarkable.  No intracranial hemorrhage, mass effect or midline shift.  No acute infarction.  No mass lesion is noted on this unenhanced scan.  IMPRESSION: No acute intracranial abnormality.  CT maxillofacial without IV contrast findings:  Comparison exam 06/02/2009  Axial images shows no nasal bone fracture.  There is mild left perinasal soft tissue swelling.  There is left periorbital anteriorly soft tissue swelling.  Mild soft tissue swelling and subcutaneous stranding of the left face.  No facial hematoma or fluid collection is noted.  No intra orbital hematoma.  Bilateral eye globe is symmetrical in appearance.  Coronal images shows no mandibular fracture. There is chronic mucosal thickening superior aspect of the left maxillary sinus without change from prior exam.  No  paranasal sinuses air fluid levels are noted.  No mandibular fracture is noted.  Sagittal images shows  patent oral pharyngeal and nasopharyngeal airway.  No retropharyngeal soft tissue swelling.  No zygomatic fracture is noted.  Impression: 1.  There is left periorbital soft tissue swelling and subcutaneous stranding.  No intra orbital hematoma.  Mild soft tissue swelling noted left perinasal region and left face.  No facial fluid collection is noted. 2.  No facial fractures are noted. 3.  Stable chronic mucosal thickening superior aspect of the left maxillary sinus. 4.  No nasal bone fracture is identified.  Original Report Authenticated By: Lahoma Crocker, M.D.   Ct Maxillofacial Wo Cm  09/17/2011  *RADIOLOGY REPORT*  Clinical Data: Fall  CT HEAD WITHOUT CONTRAST,CT MAXILLOFACIAL WITHOUT CONTRAST  Technique:  Contiguous axial images were obtained from the base of the skull through the vertex without contrast.,Technique: Multidetector CT imaging of the maxillofacial structures was performed. Multiplanar CT image reconstructions were also generated.  Comparison: 09/15/2007  Findings: No skull fracture is noted.  Paranasal sinuses and mastoid air cells are unremarkable.  No intracranial hemorrhage, mass effect or midline shift.  No acute infarction.  No mass lesion is noted on this unenhanced scan.  IMPRESSION: No acute intracranial abnormality.  CT maxillofacial without IV contrast findings:  Comparison exam 06/02/2009  Axial images shows no nasal bone fracture.  There is mild left perinasal soft tissue swelling.  There is left periorbital anteriorly soft tissue swelling.  Mild soft tissue swelling and subcutaneous stranding of the left face.  No facial hematoma or fluid collection is noted.  No intra orbital hematoma.  Bilateral eye globe is symmetrical in appearance.  Coronal images shows no mandibular fracture. There is chronic mucosal thickening superior aspect of the left maxillary sinus without change from  prior exam.  No paranasal sinuses air fluid levels are noted.  No mandibular fracture is noted.  Sagittal images shows  patent oral pharyngeal and nasopharyngeal airway.  No retropharyngeal soft tissue swelling.  No zygomatic fracture is noted.  Impression: 1.  There is left periorbital soft tissue  swelling and subcutaneous stranding.  No intra orbital hematoma.  Mild soft tissue swelling noted left perinasal region and left face.  No facial fluid collection is noted. 2.  No facial fractures are noted. 3.  Stable chronic mucosal thickening superior aspect of the left maxillary sinus. 4.  No nasal bone fracture is identified.  Original Report Authenticated By: Lahoma Crocker, M.D.     MDM  Patient with fall there was a mechanical fall.  I did obtain CT scans of her head and face to rule out intracranial hemorrhage given her headache and facial fractures given the swelling and tenderness on exam.  She shows no signs of intracranial bleeding or fractures at this time.  She has a normal mental status I believe is safe for discharge home      [place], MD 09/17/11 [phone]

## 2011-09-17 NOTE — ED Notes (Signed)
Pt reports falling frequently due to diabetic neuropathy, today stood up in the bathroom and fell hitting left face on the bathtub, swellling and bruising of the left eye, swelling of the nose, left arm pain, pain 8/10

## 2011-09-17 NOTE — Progress Notes (Signed)
This encounter was created in error - please disregard.

## 2011-09-17 NOTE — Discharge Instructions (Signed)

## 2011-10-02 ENCOUNTER — Telehealth: Payer: Self-pay | Admitting: *Deleted

## 2011-10-02 ENCOUNTER — Other Ambulatory Visit: Payer: Self-pay | Admitting: Family Medicine

## 2011-10-02 MED ORDER — QUINAPRIL HCL 40 MG PO TABS: 40.0000 mg | ORAL_TABLET | Freq: Every day | ORAL | Status: DC

## 2011-10-02 NOTE — Telephone Encounter (Signed)
Received refill request from  [facility] for Accupril 40 mg, one tab daily. Will route message to Dr. Nehemiah Settle. Can be faxed to [phone], Phone number [phone]. Or send message back to [place] and I will call in.

## 2011-10-02 NOTE — Telephone Encounter (Signed)
Medication refilled

## 2011-10-08 ENCOUNTER — Encounter: Payer: Self-pay | Admitting: Family Medicine

## 2011-10-08 ENCOUNTER — Ambulatory Visit (INDEPENDENT_AMBULATORY_CARE_PROVIDER_SITE_OTHER): Payer: Self-pay | Admitting: Family Medicine

## 2011-10-08 VITALS — BP 161/78 | HR 69 | Temp 98.0°F | Ht 69.0 in | Wt 328.0 lb

## 2011-10-08 DIAGNOSIS — E1149 Type 2 diabetes mellitus with other diabetic neurological complication: Secondary | ICD-10-CM

## 2011-10-08 DIAGNOSIS — E11622 Type 2 diabetes mellitus with other skin ulcer: Secondary | ICD-10-CM

## 2011-10-08 DIAGNOSIS — E1169 Type 2 diabetes mellitus with other specified complication: Secondary | ICD-10-CM

## 2011-10-08 DIAGNOSIS — E1142 Type 2 diabetes mellitus with diabetic polyneuropathy: Secondary | ICD-10-CM

## 2011-10-08 DIAGNOSIS — I10 Essential (primary) hypertension: Secondary | ICD-10-CM

## 2011-10-08 DIAGNOSIS — L97909 Non-pressure chronic ulcer of unspecified part of unspecified lower leg with unspecified severity: Secondary | ICD-10-CM

## 2011-10-08 MED ORDER — TRIAMTERENE-HCTZ 37.5-25 MG PO TABS: 1.0000 | ORAL_TABLET | Freq: Every morning | ORAL | Status: DC

## 2011-10-08 MED ORDER — GABAPENTIN 300 MG PO CAPS: 900.0000 mg | ORAL_CAPSULE | Freq: Three times a day (TID) | ORAL | Status: DC

## 2011-10-08 MED ORDER — VENLAFAXINE HCL ER 75 MG PO CP24: 225.0000 mg | ORAL_CAPSULE | Freq: Every morning | ORAL | Status: DC

## 2011-10-08 MED ORDER — METOPROLOL SUCCINATE ER 50 MG PO TB24: 50.0000 mg | ORAL_TABLET | Freq: Every morning | ORAL | Status: DC

## 2011-10-08 MED ORDER — ATORVASTATIN CALCIUM 80 MG PO TABS: 80.0000 mg | ORAL_TABLET | Freq: Every day | ORAL | Status: DC

## 2011-10-08 MED ORDER — OXYCODONE-ACETAMINOPHEN 5-325 MG PO TABS: 1.0000 | ORAL_TABLET | Freq: Four times a day (QID) | ORAL | Status: DC | PRN

## 2011-10-08 NOTE — Assessment & Plan Note (Signed)
Has not been taking maxzide.  Will restart and recheck at next appt.

## 2011-10-08 NOTE — Patient Instructions (Addendum)
If fasting blood sugar is less than 100, take 75 units of insulin If fasting blood sugar is less than 150, take 115 units of insulin If fasting blood sugar is greater than 150, take full dose.  If blood sugar before dinner is less than 100, take 50 units of insulin If blood sugar before dinner is less than 150, take 100 units of insulin If blood sugar before dinner is greater than 150, take full dose  Please check blood sugars four times a day - blood sugar values and how much insulin you took. Please bring your log book to your next appointment.

## 2011-10-08 NOTE — Assessment & Plan Note (Signed)
Patient to call wound center.  If needs referral, will provide.

## 2011-10-08 NOTE — Assessment & Plan Note (Addendum)
Stressed importance of taking insulin all the time due to long acting component.  Though I was hesitant to make regimen more complicated, will adjust insulin to the following:  If fasting blood sugar is less than 100, take 75 units of insulin If fasting blood sugar is less than 150, take 115 units of insulin If fasting blood sugar is greater than 150, take full dose.  If blood sugar before dinner is less than 100, take 50 units of insulin If blood sugar before dinner is less than 150, take 100 units of insulin If blood sugar before dinner is greater than 150, take full dose  Check blood sugars 4 times daily.  Follow up in 2 weeks with blood sugar log.

## 2011-10-08 NOTE — Progress Notes (Signed)
  Subjective:    Patient ID: [name], female    DOB: [birth year], 56 y.o.   MRN: [id number]  HPI Patient seen for follow up of DM2.  Fasting blood sugar 189 this morning.  Symptomatic around 90.  If less than 130, does not take insulin.  Has been taking metformin. Has been trying to improve diet, although weight still increasing.  Has wound on right lower leg for 3 weeks with extreme pain.  Has been seen at wound care in the past for similar wounds in the past several months.  No purulent drainage from site.  HTN - trying to watch salt intake.  Has not been taking Maxzide.    Review of Systems     Objective:   Physical Exam  Constitutional: She is oriented to person, place, and time. She appears well-developed and well-nourished.  HENT:  Head: Normocephalic and atraumatic.  Cardiovascular: Normal rate.   Neurological: She is alert and oriented to person, place, and time.  Skin:       Right lower extremity erythematous though not hot to touch. Large Band-Aid covering wound  Psychiatric: She has a normal mood and affect. Her behavior is normal. Judgment and thought content normal.       Assessment & Plan:

## 2011-10-10 ENCOUNTER — Encounter (HOSPITAL_BASED_OUTPATIENT_CLINIC_OR_DEPARTMENT_OTHER): Payer: Self-pay | Attending: Internal Medicine

## 2011-10-10 DIAGNOSIS — E1142 Type 2 diabetes mellitus with diabetic polyneuropathy: Secondary | ICD-10-CM | POA: Insufficient documentation

## 2011-10-10 DIAGNOSIS — Z794 Long term (current) use of insulin: Secondary | ICD-10-CM | POA: Insufficient documentation

## 2011-10-10 DIAGNOSIS — E1149 Type 2 diabetes mellitus with other diabetic neurological complication: Secondary | ICD-10-CM | POA: Insufficient documentation

## 2011-10-10 DIAGNOSIS — E1169 Type 2 diabetes mellitus with other specified complication: Secondary | ICD-10-CM | POA: Insufficient documentation

## 2011-10-10 DIAGNOSIS — L97509 Non-pressure chronic ulcer of other part of unspecified foot with unspecified severity: Secondary | ICD-10-CM | POA: Insufficient documentation

## 2011-10-10 DIAGNOSIS — I739 Peripheral vascular disease, unspecified: Secondary | ICD-10-CM | POA: Insufficient documentation

## 2011-10-10 DIAGNOSIS — I872 Venous insufficiency (chronic) (peripheral): Secondary | ICD-10-CM | POA: Insufficient documentation

## 2011-10-10 DIAGNOSIS — I10 Essential (primary) hypertension: Secondary | ICD-10-CM | POA: Insufficient documentation

## 2011-10-10 DIAGNOSIS — I2510 Atherosclerotic heart disease of native coronary artery without angina pectoris: Secondary | ICD-10-CM | POA: Insufficient documentation

## 2011-10-10 NOTE — Progress Notes (Signed)
[facility]  NAME:  [name], [name]                 ACCOUNT NO.:  [id number]  MEDICAL RECORD NO.:  [id number]      DATE OF BIRTH:  1956-04-05  PHYSICIAN:  Ricard Dillon, M.D.      VISIT DATE:                                  OFFICE VISIT   [name] is a lady we know from previous stays here with severe venous stasis ulceration in the right leg.  She is 56 years old.  She tells me that wound developed on her right anterior leg actually just below where the previous one was.  We had discharged her from this clinic in December 2012.  About a week ago, this developed surrounding erythema and pain, and she is here for our review of this.  There is no trauma history here that she is aware of.  She is a type 2 diabetic on insulin. She also has diabetic neuropathy, hypertension, coronary artery disease, and peripheral vascular disease.  On examination, her temperature was 98.5, pulse 79, respirations 18, blood pressure 159/71.  The area in question was a superficial wound over that measuring 2.6 x 3.4 x 0.1 on the right anterior leg.  This is just below her previous wound.  The more concerning thing was an intense area of painful erythema around this with warmth.  I did culture the surface of the wound, although there was nothing deeper here that really looked amendable to culturing.  IMPRESSION:  A new wound on the right anterior leg in the setting of significant venous stasis and some degree of PAD.  I cannot actually put my hands on her previous vascular studies, although I know these were done, and we will look at this next week.  In the mean time, we have started her on Septra DS 1 p.o. b.i.d., and we will do a wound check early next week.          ______________________________ Ricard Dillon, M.D.     MGR/MEDQ  D:  10/10/2011  T:  10/10/2011  Job:  [id number]

## 2011-10-22 ENCOUNTER — Ambulatory Visit: Payer: Self-pay | Admitting: Family Medicine

## 2011-10-24 ENCOUNTER — Encounter (HOSPITAL_BASED_OUTPATIENT_CLINIC_OR_DEPARTMENT_OTHER): Payer: Self-pay | Attending: Internal Medicine

## 2011-10-24 DIAGNOSIS — I872 Venous insufficiency (chronic) (peripheral): Secondary | ICD-10-CM | POA: Insufficient documentation

## 2011-10-24 DIAGNOSIS — I10 Essential (primary) hypertension: Secondary | ICD-10-CM | POA: Insufficient documentation

## 2011-10-24 DIAGNOSIS — Z794 Long term (current) use of insulin: Secondary | ICD-10-CM | POA: Insufficient documentation

## 2011-10-24 DIAGNOSIS — L02419 Cutaneous abscess of limb, unspecified: Secondary | ICD-10-CM | POA: Insufficient documentation

## 2011-10-24 DIAGNOSIS — E119 Type 2 diabetes mellitus without complications: Secondary | ICD-10-CM | POA: Insufficient documentation

## 2011-10-24 DIAGNOSIS — L97809 Non-pressure chronic ulcer of other part of unspecified lower leg with unspecified severity: Secondary | ICD-10-CM | POA: Insufficient documentation

## 2011-10-24 DIAGNOSIS — Z79899 Other long term (current) drug therapy: Secondary | ICD-10-CM | POA: Insufficient documentation

## 2011-11-07 ENCOUNTER — Encounter (HOSPITAL_BASED_OUTPATIENT_CLINIC_OR_DEPARTMENT_OTHER): Payer: Self-pay

## 2011-11-10 ENCOUNTER — Emergency Department (HOSPITAL_COMMUNITY): Payer: Self-pay

## 2011-11-10 ENCOUNTER — Encounter (HOSPITAL_COMMUNITY): Payer: Self-pay | Admitting: Emergency Medicine

## 2011-11-10 ENCOUNTER — Emergency Department (HOSPITAL_COMMUNITY)
Admission: EM | Admit: 2011-11-10 | Discharge: 2011-11-10 | Disposition: A | Discharge status: Home / Self Care | Payer: Self-pay | Attending: Emergency Medicine | Admitting: Emergency Medicine

## 2011-11-10 DIAGNOSIS — E119 Type 2 diabetes mellitus without complications: Secondary | ICD-10-CM | POA: Insufficient documentation

## 2011-11-10 DIAGNOSIS — R071 Chest pain on breathing: Secondary | ICD-10-CM | POA: Insufficient documentation

## 2011-11-10 DIAGNOSIS — I10 Essential (primary) hypertension: Secondary | ICD-10-CM | POA: Insufficient documentation

## 2011-11-10 DIAGNOSIS — E7800 Pure hypercholesterolemia, unspecified: Secondary | ICD-10-CM | POA: Insufficient documentation

## 2011-11-10 DIAGNOSIS — Z79899 Other long term (current) drug therapy: Secondary | ICD-10-CM | POA: Insufficient documentation

## 2011-11-10 MED ORDER — OXYCODONE-ACETAMINOPHEN 5-325 MG PO TABS
2.0000 | ORAL_TABLET | Freq: Once | ORAL | Status: AC
  Administered 2011-11-10: 2 via ORAL
  Filled 2011-11-10: qty 2

## 2011-11-10 MED ORDER — OXYCODONE-ACETAMINOPHEN 5-325 MG PO TABS: 2.0000 | ORAL_TABLET | Freq: Once | ORAL | Status: AC

## 2011-11-10 NOTE — ED Notes (Signed)
Patient transported to X-ray 

## 2011-11-10 NOTE — ED Notes (Signed)
Pt reports taking extra strength Excedrin this morning with moderate relief.

## 2011-11-10 NOTE — ED Provider Notes (Signed)
Medical screening examination/treatment/procedure(s) were performed by non-physician practitioner and as supervising physician I was immediately available for consultation/collaboration.   Ezequiel Essex, MD 11/10/11 2352
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
## 2011-11-28 ENCOUNTER — Encounter (HOSPITAL_BASED_OUTPATIENT_CLINIC_OR_DEPARTMENT_OTHER): Payer: Medicaid Other | Attending: Internal Medicine

## 2011-11-28 DIAGNOSIS — I872 Venous insufficiency (chronic) (peripheral): Secondary | ICD-10-CM | POA: Insufficient documentation

## 2011-11-28 DIAGNOSIS — E1169 Type 2 diabetes mellitus with other specified complication: Secondary | ICD-10-CM | POA: Insufficient documentation

## 2011-11-28 DIAGNOSIS — L97809 Non-pressure chronic ulcer of other part of unspecified lower leg with unspecified severity: Secondary | ICD-10-CM | POA: Insufficient documentation

## 2011-12-12 NOTE — Progress Notes (Signed)
[facility]  NAME:  [name], [name]                      ACCOUNT NO.:  MEDICAL RECORD NO.:  [id number]      DATE OF BIRTH:  07/25/1955  PHYSICIAN:  Elesa Hacker, M.D.              VISIT DATE:                                  OFFICE VISIT   ADMISSION DIAGNOSES:  Diabetes mellitus with chronic venous insufficiency and leg ulceration.  DISCHARGE DIAGNOSES:  Diabetes mellitus with chronic venous insufficiency and leg ulceration.  CLINIC COURSE:  The patient was started on Silvercel, Unna boot, Lasix and Coban.  Her course was one of gradual improvement.  On December 12, 2011 the wound was completely healed.  Her discharge condition is good.  RECOMMENDATIONS:  Elevation when possible and support stockings.  See [place] p.r.n.     Elesa Hacker, M.D.     RA/MEDQ  D:  12/12/2011  T:  12/12/2011  Job:  [id number]

## 2011-12-30 ENCOUNTER — Other Ambulatory Visit: Payer: Self-pay | Admitting: Family Medicine

## 2011-12-30 DIAGNOSIS — E119 Type 2 diabetes mellitus without complications: Secondary | ICD-10-CM

## 2011-12-30 MED ORDER — INSULIN ASPART PROT & ASPART (70-30 MIX) 100 UNIT/ML ~~LOC~~ SUSP: 230.0000 [IU] | Freq: Every day | SUBCUTANEOUS | Status: DC

## 2011-12-30 NOTE — Telephone Encounter (Signed)
Faxed in refill to [facility] department for novolin 70/30. 250 units in AM and 150 units PM with sliding scale as previously directed.

## 2012-01-02 ENCOUNTER — Emergency Department (HOSPITAL_COMMUNITY)
Admission: EM | Admit: 2012-01-02 | Discharge: 2012-01-02 | Disposition: A | Discharge status: Home / Self Care | Payer: Medicaid Other | Attending: Emergency Medicine | Admitting: Emergency Medicine

## 2012-01-02 ENCOUNTER — Encounter (HOSPITAL_COMMUNITY): Payer: Self-pay | Admitting: *Deleted

## 2012-01-02 ENCOUNTER — Encounter (HOSPITAL_BASED_OUTPATIENT_CLINIC_OR_DEPARTMENT_OTHER): Payer: Medicaid Other | Attending: Internal Medicine

## 2012-01-02 DIAGNOSIS — I739 Peripheral vascular disease, unspecified: Secondary | ICD-10-CM | POA: Insufficient documentation

## 2012-01-02 DIAGNOSIS — Z8614 Personal history of Methicillin resistant Staphylococcus aureus infection: Secondary | ICD-10-CM | POA: Insufficient documentation

## 2012-01-02 DIAGNOSIS — G4733 Obstructive sleep apnea (adult) (pediatric): Secondary | ICD-10-CM | POA: Insufficient documentation

## 2012-01-02 DIAGNOSIS — E1169 Type 2 diabetes mellitus with other specified complication: Secondary | ICD-10-CM | POA: Insufficient documentation

## 2012-01-02 DIAGNOSIS — A498 Other bacterial infections of unspecified site: Secondary | ICD-10-CM | POA: Insufficient documentation

## 2012-01-02 DIAGNOSIS — E162 Hypoglycemia, unspecified: Secondary | ICD-10-CM

## 2012-01-02 DIAGNOSIS — I872 Venous insufficiency (chronic) (peripheral): Secondary | ICD-10-CM | POA: Insufficient documentation

## 2012-01-02 DIAGNOSIS — I10 Essential (primary) hypertension: Secondary | ICD-10-CM | POA: Insufficient documentation

## 2012-01-02 DIAGNOSIS — D649 Anemia, unspecified: Secondary | ICD-10-CM | POA: Insufficient documentation

## 2012-01-02 DIAGNOSIS — Z87891 Personal history of nicotine dependence: Secondary | ICD-10-CM | POA: Insufficient documentation

## 2012-01-02 DIAGNOSIS — L0390 Cellulitis, unspecified: Secondary | ICD-10-CM

## 2012-01-02 DIAGNOSIS — G589 Mononeuropathy, unspecified: Secondary | ICD-10-CM | POA: Insufficient documentation

## 2012-01-02 DIAGNOSIS — L02419 Cutaneous abscess of limb, unspecified: Secondary | ICD-10-CM | POA: Insufficient documentation

## 2012-01-02 DIAGNOSIS — L03119 Cellulitis of unspecified part of limb: Secondary | ICD-10-CM | POA: Insufficient documentation

## 2012-01-02 DIAGNOSIS — E785 Hyperlipidemia, unspecified: Secondary | ICD-10-CM | POA: Insufficient documentation

## 2012-01-02 DIAGNOSIS — L97509 Non-pressure chronic ulcer of other part of unspecified foot with unspecified severity: Secondary | ICD-10-CM | POA: Insufficient documentation

## 2012-01-02 DIAGNOSIS — Z794 Long term (current) use of insulin: Secondary | ICD-10-CM | POA: Insufficient documentation

## 2012-01-02 DIAGNOSIS — Z79899 Other long term (current) drug therapy: Secondary | ICD-10-CM | POA: Insufficient documentation

## 2012-01-02 DIAGNOSIS — Z7982 Long term (current) use of aspirin: Secondary | ICD-10-CM | POA: Insufficient documentation

## 2012-01-02 DIAGNOSIS — I2510 Atherosclerotic heart disease of native coronary artery without angina pectoris: Secondary | ICD-10-CM | POA: Insufficient documentation

## 2012-01-02 DIAGNOSIS — E7800 Pure hypercholesterolemia, unspecified: Secondary | ICD-10-CM | POA: Insufficient documentation

## 2012-01-02 LAB — GLUCOSE, CAPILLARY
Glucose-Capillary: 69 mg/dL — ABNORMAL LOW (ref 70–99)
Glucose-Capillary: 88 mg/dL (ref 70–99)
Glucose-Capillary: 92 mg/dL (ref 70–99)

## 2012-01-02 MED ORDER — HYDROCODONE-ACETAMINOPHEN 5-325 MG PO TABS: 2.0000 | ORAL_TABLET | ORAL | Status: DC | PRN

## 2012-01-02 NOTE — ED Notes (Signed)
Pt reports CBG 198 this am, took 200units novolog 70/30, did not eat afterwards, went to PCP and was checked after having glucerna and snacks and was 69. Pt was complaining of weakness, dizziness then. Currently feels "okay." CBG in dept 88. Sts a "good BS for her is anything less than 200."

## 2012-01-02 NOTE — ED Provider Notes (Signed)
History     CSN: [id number]  Arrival date & time 01/02/12  1455   First MD Initiated Contact with Patient 01/02/12 1617      Chief Complaint  Patient presents with  . Hypoglycemia    (Consider location/radiation/quality/duration/timing/severity/associated sxs/prior treatment) HPI Comments: Patient comes in today with a chief complaint of hypoglycemia.  This morning the patient had a CBG of 198.  She then took her Insulin and Metformin, but did not eat.  She then went to her Wound Management doctor and was feeling lightheaded and dizzy.  Her blood sugar was found to be 69.  She was then given Glucerna and crackers and sent to the ED.  Upon arrival in the ED she is asymptomatic and has a blood sugar of 88.  She has been seeing Wound Management for a cellulitis of her RLE.  Cellulitis has been present for the past 2 weeks.  She reports that she saw Wound Management today and they switched her antibiotic to Doxycycline.  She denies any fever or chills.  She reports that the area is painful.  The history is provided by the patient.    Past Medical History  Diagnosis Date  . GOITER, MULTINODULAR 05/13/2007  . DIABETES MELLITUS, WITH NEUROLOGICAL COMPLICATIONS 7/90/2409  . HYPERCHOLESTEROLEMIA 03/20/2010  . DYSLIPIDEMIA 05/13/2007  . DEPRESSION, CHRONIC 10/06/2007  . OBSTRUCTIVE SLEEP APNEA 06/12/2007    uses CPAP  . PERIPHERAL NEUROPATHY 01/12/2009  . HYPERTENSION 03/20/2010  . UNSPECIFIED VENOUS INSUFFICIENCY 05/28/2010  . PSORIASIS 05/28/2010  . BACK PAIN, LUMBAR 11/19/2007  . ANEMIA, PERNICIOUS, HX OF 05/13/2007  . ASYMPTOMATIC POSTMENOPAUSAL STATUS 02/18/2008  . Hx of colonic polyps 12/04/2010  . NASH (nonalcoholic steatohepatitis)     Past Surgical History  Procedure Date  . Knee arthroscopy 2003    right  . Varicose vein surgery     Remotef  . Tonsillectomy   . Exercise myoview 01/24/2005  . Electrocardiogram 04/16/2006  . Rotator cuff repair 2008    right    Family  History  Problem Relation Age of Onset  . Cancer Father     Colon Cancer  . Hyperlipidemia Father   . Hypertension Father   . Cirrhosis Father   . Colon cancer Father 66  . Cancer Sister     Colon Cancer  . Colon cancer Sister 41  . Aneurysm Mother   . Drug abuse Neg Hx   . Heart disease Neg Hx     CAD  . Stomach cancer Neg Hx     History  Substance Use Topics  . Smoking status: Former Smoker    Quit date: 08/06/1994  . Smokeless tobacco: Former Systems developer    Quit date: 10/18/1994  . Alcohol Use: No    OB History    Grav Para Term Preterm Abortions TAB SAB Ect Mult Living                  Review of Systems  Constitutional: Negative for fever, chills and diaphoresis.  Respiratory: Negative for shortness of breath.   Gastrointestinal: Negative for nausea, vomiting and abdominal pain.  Skin:       Cellulitis of right LE  Neurological: Negative for dizziness, syncope, weakness, light-headedness, numbness and headaches.  All other systems reviewed and are negative.    Allergies  Keflex; Adhesive; and Neosporin  Home Medications   Current Outpatient Rx  Name Route Sig Dispense Refill  . ASPIRIN EC 81 MG PO TBEC Oral Take 81 mg by mouth  daily.    . ATORVASTATIN CALCIUM 40 MG PO TABS Oral Take 80 mg by mouth at bedtime. Pt takes two tabs of 40 mg = 80 mg at night    . ONETOUCH ULTRA MINI W/DEVICE KIT  Use glucometer for home glucose monitoring, one time daily 1 each 0  . INSULIN ASPART PROT & ASPART (70-30) 100 UNIT/ML [place] SUSP Subcutaneous Inject 150 Units into the skin at bedtime. If blood sugar before dinner is less than 100, take 50 units of insulin If blood sugar before dinner is less than 150, take 100 units of insulin If blood sugar before dinner is greater than 150, take full dose    . INSULIN ASPART PROT & ASPART (70-30) 100 UNIT/ML [place] SUSP Subcutaneous Inject 230 Units into the skin daily before breakfast. If fasting blood sugar is less than 100, take 75 units of  insulin If fasting blood sugar is less than 150, take 115 units of insulin If fasting blood sugar is greater than 150, take full dose. 360 mL 12  . METFORMIN HCL 1000 MG PO TABS Oral Take 1,000 mg by mouth 2 (two) times daily with a meal.    . METOPROLOL SUCCINATE ER 50 MG PO TB24 Oral Take 1 tablet (50 mg total) by mouth every morning. 30 tablet 11  . QUINAPRIL HCL 40 MG PO TABS Oral Take 1 tablet (40 mg total) by mouth daily. 30 tablet 11  . TORSEMIDE 20 MG PO TABS Oral Take 20 mg by mouth daily.    . TRIAMTERENE-HCTZ 37.5-25 MG PO TABS Oral Take 1 each (1 tablet total) by mouth every morning. 30 tablet 11  . VENLAFAXINE HCL ER 75 MG PO CP24 Oral Take 3 capsules (225 mg total) by mouth every morning. 30 capsule 11    BP 131/63  Pulse 58  Temp 98.3 F (36.8 C) (Oral)  Resp 22  SpO2 93%  Physical Exam  Nursing note and vitals reviewed. Constitutional: She is oriented to person, place, and time. She appears well-developed and well-nourished.  HENT:  Head: Normocephalic and atraumatic.  Mouth/Throat: Oropharynx is clear and moist.  Eyes: EOM are normal. Pupils are equal, round, and reactive to light.  Neck: Normal range of motion. Neck supple.  Cardiovascular: Normal rate, regular rhythm and normal heart sounds.   Pulmonary/Chest: Effort normal and breath sounds normal.  Abdominal: Soft. There is no tenderness.  Musculoskeletal: Normal range of motion.  Neurological: She is alert and oriented to person, place, and time. She has normal strength. No cranial nerve deficit or sensory deficit. Gait normal.  Skin:     Psychiatric: She has a normal mood and affect.    ED Course  Procedures (including critical care time)   Labs Reviewed  GLUCOSE, CAPILLARY   No results found.   No diagnosis found.    MDM  Patient being treated by Wound Management for Cellulitis of RLE.  While at Wound Management she was feeling lightheaded and had CBG of 69.  She was given Glucerna and food.   Upon arrival in the ED her blood sugar was 88 and she was asymptomatic.  Patient instructed to eat small regular meals and to follow up with PCP regarding her DM medications and fluctuating blood sugars.        Sherlyn Lees [place], PA-C 01/03/12 [phone]

## 2012-01-05 NOTE — ED Provider Notes (Signed)
Medical screening examination/treatment/procedure(s) were performed by non-physician practitioner and as supervising physician I was immediately available for consultation/collaboration.    Dot Lanes, MD 01/05/12 [phone]

## 2012-01-06 ENCOUNTER — Emergency Department (HOSPITAL_COMMUNITY): Payer: Medicaid Other

## 2012-01-06 ENCOUNTER — Inpatient Hospital Stay (HOSPITAL_COMMUNITY)
Admission: EM | Admit: 2012-01-06 | Discharge: 2012-01-09 | DRG: 603 | Disposition: A | Discharge status: Home / Self Care | Payer: Medicaid Other | Attending: Family Medicine | Admitting: Family Medicine

## 2012-01-06 ENCOUNTER — Encounter (HOSPITAL_COMMUNITY): Payer: Self-pay | Admitting: *Deleted

## 2012-01-06 DIAGNOSIS — K7689 Other specified diseases of liver: Secondary | ICD-10-CM | POA: Diagnosis present

## 2012-01-06 DIAGNOSIS — Z794 Long term (current) use of insulin: Secondary | ICD-10-CM

## 2012-01-06 DIAGNOSIS — E669 Obesity, unspecified: Secondary | ICD-10-CM | POA: Diagnosis present

## 2012-01-06 DIAGNOSIS — E876 Hypokalemia: Secondary | ICD-10-CM | POA: Diagnosis not present

## 2012-01-06 DIAGNOSIS — E1149 Type 2 diabetes mellitus with other diabetic neurological complication: Secondary | ICD-10-CM | POA: Diagnosis present

## 2012-01-06 DIAGNOSIS — Z860100 Personal history of colon polyps, unspecified: Secondary | ICD-10-CM

## 2012-01-06 DIAGNOSIS — L03119 Cellulitis of unspecified part of limb: Secondary | ICD-10-CM

## 2012-01-06 DIAGNOSIS — E119 Type 2 diabetes mellitus without complications: Secondary | ICD-10-CM

## 2012-01-06 DIAGNOSIS — L02419 Cutaneous abscess of limb, unspecified: Secondary | ICD-10-CM

## 2012-01-06 DIAGNOSIS — I10 Essential (primary) hypertension: Secondary | ICD-10-CM | POA: Diagnosis present

## 2012-01-06 DIAGNOSIS — G4733 Obstructive sleep apnea (adult) (pediatric): Secondary | ICD-10-CM | POA: Diagnosis present

## 2012-01-06 DIAGNOSIS — L97809 Non-pressure chronic ulcer of other part of unspecified lower leg with unspecified severity: Secondary | ICD-10-CM | POA: Diagnosis present

## 2012-01-06 DIAGNOSIS — E042 Nontoxic multinodular goiter: Secondary | ICD-10-CM | POA: Diagnosis present

## 2012-01-06 DIAGNOSIS — Z7982 Long term (current) use of aspirin: Secondary | ICD-10-CM

## 2012-01-06 DIAGNOSIS — E7800 Pure hypercholesterolemia, unspecified: Secondary | ICD-10-CM | POA: Diagnosis present

## 2012-01-06 DIAGNOSIS — Z79899 Other long term (current) drug therapy: Secondary | ICD-10-CM

## 2012-01-06 DIAGNOSIS — I872 Venous insufficiency (chronic) (peripheral): Secondary | ICD-10-CM | POA: Diagnosis present

## 2012-01-06 DIAGNOSIS — M7989 Other specified soft tissue disorders: Secondary | ICD-10-CM

## 2012-01-06 DIAGNOSIS — L408 Other psoriasis: Secondary | ICD-10-CM | POA: Diagnosis present

## 2012-01-06 DIAGNOSIS — E1142 Type 2 diabetes mellitus with diabetic polyneuropathy: Secondary | ICD-10-CM | POA: Diagnosis present

## 2012-01-06 DIAGNOSIS — Z6841 Body Mass Index (BMI) 40.0 and over, adult: Secondary | ICD-10-CM

## 2012-01-06 LAB — CBC WITH DIFFERENTIAL/PLATELET
HCT: 40.8 % (ref 36.0–46.0)
Hemoglobin: 12.5 g/dL (ref 12.0–15.0)
Lymphocytes Relative: 22 % (ref 12–46)
Lymphs Abs: 2 10*3/uL (ref 0.7–4.0)
Monocytes Absolute: 0.7 10*3/uL (ref 0.1–1.0)
Monocytes Relative: 8 % (ref 3–12)
Neutro Abs: 5.9 10*3/uL (ref 1.7–7.7)
Neutrophils Relative %: 65 % (ref 43–77)
RBC: 5.16 MIL/uL — ABNORMAL HIGH (ref 3.87–5.11)
WBC: 8.9 10*3/uL (ref 4.0–10.5)

## 2012-01-06 LAB — BASIC METABOLIC PANEL
BUN: 10 mg/dL (ref 6–23)
CO2: 28 mEq/L (ref 19–32)
Chloride: 104 mEq/L (ref 96–112)
Creatinine, Ser: 0.81 mg/dL (ref 0.50–1.10)
Potassium: 3.6 mEq/L (ref 3.5–5.1)

## 2012-01-06 MED ORDER — SODIUM CHLORIDE 0.9 % IV SOLN
INTRAVENOUS | Status: DC
  Administered 2012-01-06: 16:00:00 via INTRAVENOUS

## 2012-01-06 MED ORDER — VANCOMYCIN HCL 1000 MG IV SOLR
1250.0000 mg | Freq: Three times a day (TID) | INTRAVENOUS | Status: DC
  Filled 2012-01-06 (×2): qty 1250

## 2012-01-06 MED ORDER — ATORVASTATIN CALCIUM 80 MG PO TABS
80.0000 mg | ORAL_TABLET | Freq: Every day | ORAL | Status: DC
  Administered 2012-01-07 – 2012-01-08 (×3): 80 mg via ORAL
  Filled 2012-01-06 (×4): qty 1

## 2012-01-06 MED ORDER — LISINOPRIL 40 MG PO TABS
40.0000 mg | ORAL_TABLET | Freq: Every day | ORAL | Status: DC
  Administered 2012-01-07 – 2012-01-09 (×3): 40 mg via ORAL
  Filled 2012-01-06 (×3): qty 1

## 2012-01-06 MED ORDER — CIPROFLOXACIN IN D5W 400 MG/200ML IV SOLN
400.0000 mg | Freq: Once | INTRAVENOUS | Status: AC
  Administered 2012-01-06: 400 mg via INTRAVENOUS
  Filled 2012-01-06: qty 200

## 2012-01-06 MED ORDER — ONDANSETRON HCL 4 MG/2ML IJ SOLN: 4.0000 mg | Freq: Three times a day (TID) | INTRAMUSCULAR | Status: DC | PRN

## 2012-01-06 MED ORDER — ONDANSETRON HCL 4 MG/2ML IJ SOLN
4.0000 mg | Freq: Once | INTRAMUSCULAR | Status: AC
  Administered 2012-01-06: 4 mg via INTRAVENOUS
  Filled 2012-01-06: qty 2

## 2012-01-06 MED ORDER — ACETAMINOPHEN 650 MG RE SUPP: 650.0000 mg | Freq: Four times a day (QID) | RECTAL | Status: DC | PRN

## 2012-01-06 MED ORDER — SODIUM CHLORIDE 0.9 % IV SOLN: INTRAVENOUS | Status: DC

## 2012-01-06 MED ORDER — LEVOFLOXACIN IN D5W 750 MG/150ML IV SOLN
750.0000 mg | INTRAVENOUS | Status: DC
  Administered 2012-01-07 – 2012-01-08 (×2): 750 mg via INTRAVENOUS
  Filled 2012-01-06 (×2): qty 150

## 2012-01-06 MED ORDER — ACETAMINOPHEN 325 MG PO TABS
650.0000 mg | ORAL_TABLET | Freq: Four times a day (QID) | ORAL | Status: DC | PRN
Start: 2012-01-06 — End: 2012-01-09

## 2012-01-06 MED ORDER — QUINAPRIL HCL 10 MG PO TABS: 40.0000 mg | ORAL_TABLET | Freq: Every day | ORAL | Status: DC

## 2012-01-06 MED ORDER — ASPIRIN EC 81 MG PO TBEC
81.0000 mg | DELAYED_RELEASE_TABLET | Freq: Every day | ORAL | Status: DC
  Administered 2012-01-07 – 2012-01-09 (×3): 81 mg via ORAL
  Filled 2012-01-06 (×3): qty 1

## 2012-01-06 MED ORDER — CLOBETASOL PROPIONATE 0.05 % EX CREA
TOPICAL_CREAM | Freq: Two times a day (BID) | CUTANEOUS | Status: DC
  Administered 2012-01-07: 1 via TOPICAL
  Administered 2012-01-07 – 2012-01-09 (×5): via TOPICAL
  Filled 2012-01-06 (×2): qty 15

## 2012-01-06 MED ORDER — INSULIN ASPART 100 UNIT/ML ~~LOC~~ SOLN
0.0000 [IU] | Freq: Three times a day (TID) | SUBCUTANEOUS | Status: DC
  Administered 2012-01-07 – 2012-01-08 (×4): 5 [IU] via SUBCUTANEOUS
  Administered 2012-01-08: 09:00:00 via SUBCUTANEOUS
  Administered 2012-01-08: 5 [IU] via SUBCUTANEOUS
  Administered 2012-01-09 (×2): 8 [IU] via SUBCUTANEOUS

## 2012-01-06 MED ORDER — HYDROMORPHONE HCL PF 1 MG/ML IJ SOLN
1.0000 mg | Freq: Once | INTRAMUSCULAR | Status: AC
  Administered 2012-01-06: 1 mg via INTRAVENOUS
  Filled 2012-01-06: qty 1

## 2012-01-06 MED ORDER — SODIUM CHLORIDE 0.9 % IV SOLN
INTRAVENOUS | Status: DC
  Administered 2012-01-08: 03:00:00 via INTRAVENOUS

## 2012-01-06 MED ORDER — HYDROCODONE-ACETAMINOPHEN 5-325 MG PO TABS
1.0000 | ORAL_TABLET | ORAL | Status: DC | PRN
  Administered 2012-01-07 – 2012-01-09 (×7): 2 via ORAL
  Filled 2012-01-06 (×7): qty 2

## 2012-01-06 MED ORDER — TRIAMTERENE-HCTZ 37.5-25 MG PO TABS
1.0000 | ORAL_TABLET | Freq: Every day | ORAL | Status: DC
  Administered 2012-01-07 – 2012-01-09 (×3): 1 via ORAL
  Filled 2012-01-06 (×3): qty 1

## 2012-01-06 MED ORDER — VENLAFAXINE HCL ER 75 MG PO CP24
225.0000 mg | ORAL_CAPSULE | Freq: Every day | ORAL | Status: DC
  Administered 2012-01-07 – 2012-01-09 (×3): 225 mg via ORAL
  Filled 2012-01-06 (×3): qty 1

## 2012-01-06 MED ORDER — HYDROMORPHONE HCL PF 1 MG/ML IJ SOLN: 1.0000 mg | INTRAMUSCULAR | Status: DC | PRN

## 2012-01-06 MED ORDER — ACETAMINOPHEN 325 MG PO TABS: 650.0000 mg | ORAL_TABLET | ORAL | Status: DC | PRN

## 2012-01-06 MED ORDER — SODIUM CHLORIDE 0.9 % IV BOLUS (SEPSIS)
500.0000 mL | Freq: Once | INTRAVENOUS | Status: AC
  Administered 2012-01-06: 500 mL via INTRAVENOUS

## 2012-01-06 MED ORDER — METOPROLOL SUCCINATE ER 50 MG PO TB24
50.0000 mg | ORAL_TABLET | Freq: Every day | ORAL | Status: DC
  Administered 2012-01-07 – 2012-01-09 (×3): 50 mg via ORAL
  Filled 2012-01-06 (×3): qty 1

## 2012-01-06 MED ORDER — HEPARIN SODIUM (PORCINE) 5000 UNIT/ML IJ SOLN
5000.0000 [IU] | Freq: Three times a day (TID) | INTRAMUSCULAR | Status: DC
  Administered 2012-01-07 – 2012-01-09 (×9): 5000 [IU] via SUBCUTANEOUS
  Filled 2012-01-06 (×11): qty 1

## 2012-01-06 MED ORDER — GABAPENTIN 300 MG PO CAPS
900.0000 mg | ORAL_CAPSULE | Freq: Three times a day (TID) | ORAL | Status: DC
  Administered 2012-01-07 – 2012-01-09 (×8): 900 mg via ORAL
  Filled 2012-01-06 (×10): qty 3

## 2012-01-06 NOTE — ED Notes (Signed)
Pt states "was sent here from the [facility], they said I need IV abx, may need an amputation, it scared me"

## 2012-01-06 NOTE — Progress Notes (Addendum)
ANTIBIOTIC CONSULT NOTE - INITIAL  Pharmacy Consult for Vancomycin Indication: cellulitis  Allergies  Allergen Reactions  . Keflex (Cephalexin) Hives  . Adhesive (Tape) Rash  . Neosporin (Neomycin-Bacitracin Zn-Polymyx) Rash   Labs:  Basename 01/06/12 1600  WBC 8.9  HGB 12.5  PLT 292  LABCREA --  CREATININE 0.81   Estimated Creatinine Clearance: 122.5 ml/min (by C-G formula based on Cr of 0.81). No results found for this basename: VANCOTROUGH:2,VANCOPEAK:2,VANCORANDOM:2,GENTTROUGH:2,GENTPEAK:2,GENTRANDOM:2,TOBRATROUGH:2,TOBRAPEAK:2,TOBRARND:2,AMIKACINPEAK:2,AMIKACINTROU:2,AMIKACIN:2, in the last 72 hours   Microbiology: No results found for this or any previous visit (from the past 720 hour(s)).  Medical History: Past Medical History  Diagnosis Date  . GOITER, MULTINODULAR 05/13/2007  . DIABETES MELLITUS, WITH NEUROLOGICAL COMPLICATIONS 02/08/5630  . HYPERCHOLESTEROLEMIA 03/20/2010  . DYSLIPIDEMIA 05/13/2007  . DEPRESSION, CHRONIC 10/06/2007  . OBSTRUCTIVE SLEEP APNEA 06/12/2007    uses CPAP  . PERIPHERAL NEUROPATHY 01/12/2009  . HYPERTENSION 03/20/2010  . UNSPECIFIED VENOUS INSUFFICIENCY 05/28/2010  . PSORIASIS 05/28/2010  . BACK PAIN, LUMBAR 11/19/2007  . ANEMIA, PERNICIOUS, HX OF 05/13/2007  . ASYMPTOMATIC POSTMENOPAUSAL STATUS 02/18/2008  . Hx of colonic polyps 12/04/2010  . NASH (nonalcoholic steatohepatitis)     Assessment: 56 year old beginning empiric antibiotics for cellulitis Also on Levaquin, allergy to cephalosporins Weight = 154 kg CrCl > 100 ml/min  Goal of Therapy:  Vancomycin trough level 10-15 mcg/ml  Plan:  1) Vancomycin 2000 mg iv x 1 now 1) Vancomycin 1500 mg IV q 12 hours 2) Follow up plan, Scr, cultures  Thank you. Anette Guarneri, PharmD 01/06/2012,11:56 PM

## 2012-01-06 NOTE — Progress Notes (Signed)
VASCULAR LAB PRELIMINARY  PRELIMINARY  PRELIMINARY  PRELIMINARY  Right lower extremity venous duplex completed.    Preliminary report:  Right:  No obvious evidence of DVT, superficial thrombosis, or Baker's cyst.  Technically difficult study due to edema in the calf.   Yzabella Crunk, RVT 01/06/2012, 4:25 PM

## 2012-01-06 NOTE — ED Provider Notes (Signed)
History     CSN: [id number]  Arrival date & time 01/06/12  1355   First MD Initiated Contact with Patient 01/06/12 [phone]      Chief Complaint  Patient presents with  . Wound Infection    (Consider location/radiation/quality/duration/timing/severity/associated sxs/prior treatment) HPI Comments: [name] is a 57 y.o. Female who was sent here after a brief evaluation at the wound care center, today, by a nurse. Her wound culture returned positive for an Escherichia coli wound infection. Lines that were drawn around the reddened area on her own have been exceeded by the redness and swelling. Patient denies fever, chills, nausea, or vomiting. She has no weakness, dizziness, abdominal pain, or back pain. She's able to walk. She states her sugars have been running about 110.  She's been taking her medications as usual. There are no palliative or exacerbating factors. She is taking doxycycline.   The history is provided by the patient.    Past Medical History  Diagnosis Date  . GOITER, MULTINODULAR 05/13/2007  . DIABETES MELLITUS, WITH NEUROLOGICAL COMPLICATIONS 5/95/6387  . HYPERCHOLESTEROLEMIA 03/20/2010  . DYSLIPIDEMIA 05/13/2007  . DEPRESSION, CHRONIC 10/06/2007  . OBSTRUCTIVE SLEEP APNEA 06/12/2007    uses CPAP  . PERIPHERAL NEUROPATHY 01/12/2009  . HYPERTENSION 03/20/2010  . UNSPECIFIED VENOUS INSUFFICIENCY 05/28/2010  . PSORIASIS 05/28/2010  . BACK PAIN, LUMBAR 11/19/2007  . ANEMIA, PERNICIOUS, HX OF 05/13/2007  . ASYMPTOMATIC POSTMENOPAUSAL STATUS 02/18/2008  . Hx of colonic polyps 12/04/2010  . NASH (nonalcoholic steatohepatitis)     Past Surgical History  Procedure Date  . Knee arthroscopy 2003    right  . Varicose vein surgery     Remotef  . Tonsillectomy   . Exercise myoview 01/24/2005  . Electrocardiogram 04/16/2006  . Rotator cuff repair 2008    right    Family History  Problem Relation Age of Onset  . Cancer Father     Colon Cancer  . Hyperlipidemia Father     . Hypertension Father   . Cirrhosis Father   . Colon cancer Father 24  . Cancer Sister     Colon Cancer  . Colon cancer Sister 41  . Aneurysm Mother   . Drug abuse Neg Hx   . Heart disease Neg Hx     CAD  . Stomach cancer Neg Hx     History  Substance Use Topics  . Smoking status: Former Smoker    Quit date: 08/06/1994  . Smokeless tobacco: Former Systems developer    Quit date: 10/18/1994  . Alcohol Use: No    OB History    Grav Para Term Preterm Abortions TAB SAB Ect Mult Living                  Review of Systems  All other systems reviewed and are negative.    Allergies  Keflex; Adhesive; and Neosporin  Home Medications   Current Outpatient Rx  Name Route Sig Dispense Refill  . ASPIRIN EC 81 MG PO TBEC Oral Take 81 mg by mouth daily.    . ATORVASTATIN CALCIUM 40 MG PO TABS Oral Take 80 mg by mouth at bedtime. Pt takes two tabs of 40 mg = 80 mg at night    . ONETOUCH ULTRA MINI W/DEVICE KIT  Use glucometer for home glucose monitoring, one time daily 1 each 0  . GABAPENTIN 300 MG PO CAPS Oral Take 900 mg by mouth 3 (three) times daily. Pt takes three 300 mg 3 times daily = 2700  mg    . INSULIN ASPART PROT & ASPART (70-30) 100 UNIT/ML [place] SUSP Subcutaneous Inject 75-230 Units into the skin daily with breakfast. Inject 230 units into skin daily before breakfast. If fasting blood sugar is less than 100, take 75 units of insulin. If fasting blood sugar is less than 150, take 115 units of insulin. If blood sugar is greater than 150, take full dose    . METFORMIN HCL 1000 MG PO TABS Oral Take 1,000 mg by mouth 2 (two) times daily with a meal.    . METOPROLOL SUCCINATE ER 50 MG PO TB24 Oral Take 50 mg by mouth daily. Take with or immediately following a meal.    . QUINAPRIL HCL 40 MG PO TABS Oral Take 40 mg by mouth daily.    . TORSEMIDE 20 MG PO TABS Oral Take 20 mg by mouth daily.    . TRIAMTERENE-HCTZ 37.5-25 MG PO TABS Oral Take 1 tablet by mouth daily.    . VENLAFAXINE HCL ER 75  MG PO CP24 Oral Take 225 mg by mouth daily. Pt takes 3 capsules of 75 mg daily = 225 mg      BP 154/55  Pulse 68  Temp 98.5 F (36.9 C) (Oral)  Resp 16  Wt 330 lb (149.687 kg)  SpO2 95%  Physical Exam  Nursing note and vitals reviewed. Constitutional: She is oriented to person, place, and time. She appears well-developed and well-nourished.  HENT:  Head: Normocephalic and atraumatic.  Eyes: Conjunctivae and EOM are normal. Pupils are equal, round, and reactive to light.  Neck: Normal range of motion and phonation normal. Neck supple.  Cardiovascular: Normal rate, regular rhythm and intact distal pulses.   Pulmonary/Chest: Effort normal and breath sounds normal. She exhibits no tenderness.  Abdominal: Soft. She exhibits no distension. There is no tenderness. There is no guarding.  Musculoskeletal: Normal range of motion. She exhibits edema (Right leg, swollen, as compared to the left).       Draining wound, anterior lower shin area drainage; material is purulent. Diffuse redness of the anterior shin. The drawn lines have been eclipsed by about 300%. She has diffuse calf tenderness. No palpable popliteal cord.  Neurological: She is alert and oriented to person, place, and time. She has normal strength. She exhibits normal muscle tone.  Skin: Skin is warm and dry.  Psychiatric: She has a normal mood and affect. Her behavior is normal. Judgment and thought content normal.    ED Course  Procedures (including critical care time)  Emergency department treatment: IV fluid bolus, and drip. IV Cipro, and IV Dilaudid.    Labs Reviewed  CBC WITH DIFFERENTIAL - Abnormal; Notable for the following:    RBC 5.16 (*)     MCH 24.2 (*)     RDW 15.6 (*)     All other components within normal limits  BASIC METABOLIC PANEL - Abnormal; Notable for the following:    GFR calc non Af Amer 79 (*)     All other components within normal limits  URINALYSIS, ROUTINE W REFLEX MICROSCOPIC  URINE CULTURE     Dg Tibia/fibula Right  01/06/2012  *RADIOLOGY REPORT*  Clinical Data: Soft tissue wound anteriorly.  Diabetes.  RIGHT TIBIA AND FIBULA - 2 VIEW  Comparison: 10/10/2010  Findings: Vascular clips are present.  There is a chronic focal cortical prominence of the anterior mid tibial diaphysis.  The remainder the bone appears normal.  Chronic osteomyelitis is a possibility, but not favored.  The remainder the bones appear unremarkable other than degenerative change of the knee.  IMPRESSION: Focal cortical prominence of the anterior mid tibial diaphysis. This was present previously and appears quite similar.  Though not favored, chronic osteomyelitis cannot be excluded.  Original Report Authenticated By: Jules Schick, M.D.     1. Cellulitis, leg       MDM  Cellulitis, right lower leg, associated with chronic wound. Possible subacute or chronic osteomyelitis. This can be evaluated further with a focal CT of the mid tibia versus an MRI. Doubt sepsis, metabolic instability, or impending vascular collapse. Patient needs to be admitted for further management.    Plan: [place] [facility] (PCP)        Richarda Blade, MD 01/06/12 [phone]

## 2012-01-06 NOTE — H&P (Signed)
[facility] Admission History and Physical Service Pager: [phone]  Patient name: [name] Medical record number: [id number] Date of birth: Nov 16, 1955 Age: 56 y.o. Gender: female  Primary Care Provider: Garret Reddish, MD  Chief Complaint: right leg cellulitis  Assessment and Plan: LATESE DUFAULT is a 56 y.o. year old female presenting with about 1 week of increased right leg pain and swelling.  1. Right leg cellulitis - Given x-ray in [facility] showing possible osteomyelitis and our x-ray showing likely chronic osteomyelitis with currently worsened symptoms, high index of suspicion for osteomyelitis v. Cellulitis 1. Had one dose of cipro in [facility]; Start Vanc/Levaquin for empiric broad-spectrum coverage, dosing per pharmacy (patient allergic to keflex with hives so zosyn not a good option) 2. Likely order AM MRI right leg to evaluate for deep tissue infection/osteomyelitis 3. Consider orthopedic surgery c/s if +osteomyelitis 4. F/u wound and blood cultures, understanding that already received one dose cipro 5. F/u CBC, BMP 6. Wound care c/s in AM  7. Pain: tylenol prn, norco prn 2. Eczema - Per patient, worsening over past few weeks.   1. Clobetasol cream BID to bilateral hands. 3. Diabetes - Pt has h/o DM on metformin and novolog 70/30, with recent hypoglycemic episodes in 60s. 1. Moderate SSI for now 2. Holding home metformin and novolog 70/30 3. Consider increasing if very elevated CBGs, although err on the side of hyperglycemia vs. hypoglycemia during hospitalization 4. F/u HgbA1c 4. Hypercholesterolemia 1. Continue home lipitor 2. Continue home aspirin 81  5. Diabetic Neuropathy  1. Continue home gabapentin 6. Hypertension  1. Continue home metoprolol, quinapril, and triamterene-HCTZ 7. Depression 1. Continue home venlafaxine XR 8. FEN/GI: NS@100cc /hr, Carb modified diet 9. Prophylaxis: Heparin SQ 10. Disposition: Admit to [facility] inpatient service  for cellulitis, possibly osteomyelitis  History of Present Illness: [name] is a 56 y.o. year old female presenting with about 1 week of increased right leg pain, redness, and swelling.  Pain is crampy/throbbing, 5/10 that sometimes worsens to the point of making patient cry.  She states that years ago she had vein stripping and implantation of metal clips in the right leg, and the area became infected and required surgery after which she did not receive a skin graft.  Then, about 9 months ago, she started having erythema, swelling, and pain on and off with bloody drainage of this area that did not get a graft as well as drainage from a birth mark just below this area.  She has been going to the wound center for this.  For the past week, symptoms have been worse and make it difficult for the patient to walk.  Four days ago, pt was diagnosed with cellulitis at the wound center and was told to go to [facility] ED for follow-up, where x-rays and an ultrasound of the right leg were done.  At that time, no clots were seen but Dr. Junious Silk noted a possible infection to the bone, so pt received one dose of ciprofloxacin 400 mg IV and was sent here to [facility] ED.    Patient denies fever, nausea, vomiting, abdominal pain, chest pain, worsened SOB.  Has some SOB at baseline and has not been using CPAP machine at home for the past year due to it needing servicing.   Patient Active Problem List  Diagnosis  . CANDIDIASIS OF VULVA AND VAGINA  . GOITER, MULTINODULAR  . DIABETES MELLITUS, WITH NEUROLOGICAL COMPLICATIONS  . HYPERCHOLESTEROLEMIA  . DYSLIPIDEMIA  .  DEPRESSION, CHRONIC  . OBSTRUCTIVE SLEEP APNEA  . PERIPHERAL NEUROPATHY  . HYPERTENSION  . UNSPECIFIED VENOUS INSUFFICIENCY  . CELLULITIS/ABSCESS, ARM  . DERMATITIS, HANDS  . PSORIASIS  . SHOULDER PAIN, RIGHT  . KNEE PAIN  . BACK PAIN, LUMBAR  . Pain in Soft Tissues of Limb  . NUMBNESS  . SKIN RASH  . HEADACHE  . ANEMIA, PERNICIOUS, HX  OF  . ASYMPTOMATIC POSTMENOPAUSAL STATUS  . Cellulitis of leg, right  . Diabetic ulcer of lower extremity  . Hx of colonic polyps  . Elevated alkaline phosphatase level  . Edema  . Osteoarthritis of knee  . Sunburn  . Cellulitis/abscess - trunk  . Paronychia of third finger of left hand  . Frequent falls  . Denture irritation  . URI (upper respiratory infection)  . Sore gums   Past Medical History: Past Medical History  Diagnosis Date  . GOITER, MULTINODULAR 05/13/2007  . DIABETES MELLITUS, WITH NEUROLOGICAL COMPLICATIONS 6/83/4196  . HYPERCHOLESTEROLEMIA 03/20/2010  . DYSLIPIDEMIA 05/13/2007  . DEPRESSION, CHRONIC 10/06/2007  . OBSTRUCTIVE SLEEP APNEA 06/12/2007    uses CPAP  . PERIPHERAL NEUROPATHY 01/12/2009  . HYPERTENSION 03/20/2010  . UNSPECIFIED VENOUS INSUFFICIENCY 05/28/2010  . PSORIASIS 05/28/2010  . BACK PAIN, LUMBAR 11/19/2007  . ANEMIA, PERNICIOUS, HX OF 05/13/2007  . ASYMPTOMATIC POSTMENOPAUSAL STATUS 02/18/2008  . Hx of colonic polyps 12/04/2010  . NASH (nonalcoholic steatohepatitis)   Hasn't used CPAP x 1 year  Past Surgical History: Past Surgical History  Procedure Date  . Knee arthroscopy 2003    right  . Varicose vein surgery     Remotef  . Tonsillectomy   . Exercise myoview 01/24/2005  . Electrocardiogram 04/16/2006  . Rotator cuff repair 2008    right   Social History: History  Substance Use Topics  . Smoking status: Former Smoker    Quit date: 08/06/1994  . Smokeless tobacco: Former Systems developer    Quit date: 10/18/1994  . Alcohol Use: No   For any additional social history documentation, please refer to relevant sections of EMR.  Family History: Family History  Problem Relation Age of Onset  . Cancer Father     Colon Cancer  . Hyperlipidemia Father   . Hypertension Father   . Cirrhosis Father   . Colon cancer Father 12  . Cancer Sister     Colon Cancer  . Colon cancer Sister 46  . Aneurysm Mother   . Drug abuse Neg Hx   . Heart disease  Neg Hx     CAD  . Stomach cancer Neg Hx    Allergies: Allergies  Allergen Reactions  . Keflex (Cephalexin) Hives  . Adhesive (Tape) Rash  . Neosporin (Neomycin-Bacitracin Zn-Polymyx) Rash   No current facility-administered medications on file prior to encounter.   Current Outpatient Prescriptions on File Prior to Encounter  Medication Sig Dispense Refill  . aspirin EC 81 MG tablet Take 81 mg by mouth daily.      Marland Kitchen atorvastatin (LIPITOR) 40 MG tablet Take 80 mg by mouth at bedtime. Pt takes two tabs of 40 mg = 80 mg at night      . Blood Glucose Monitoring Suppl (ONE TOUCH ULTRA MINI) W/DEVICE KIT Use glucometer for home glucose monitoring, one time daily  1 each  0  . gabapentin (NEURONTIN) 300 MG capsule Take 900 mg by mouth 3 (three) times daily. Pt takes three 300 mg 3 times daily = 2700 mg      . insulin aspart protamine-insulin  aspart (NOVOLOG 70/30) (70-30) 100 UNIT/ML injection Inject 75-230 Units into the skin daily with breakfast. Inject 230 units into skin daily before breakfast. If fasting blood sugar is less than 100, take 75 units of insulin. If fasting blood sugar is less than 150, take 115 units of insulin. If blood sugar is greater than 150, take full dose      . metFORMIN (GLUCOPHAGE) 1000 MG tablet Take 1,000 mg by mouth 2 (two) times daily with a meal.      . metoprolol succinate (TOPROL-XL) 50 MG 24 hr tablet Take 50 mg by mouth daily. Take with or immediately following a meal.      . quinapril (ACCUPRIL) 40 MG tablet Take 40 mg by mouth daily.      Marland Kitchen torsemide (DEMADEX) 20 MG tablet Take 20 mg by mouth daily.      Marland Kitchen triamterene-hydrochlorothiazide (MAXZIDE-25) 37.5-25 MG per tablet Take 1 tablet by mouth daily.      Marland Kitchen venlafaxine XR (EFFEXOR-XR) 75 MG 24 hr capsule Take 225 mg by mouth daily. Pt takes 3 capsules of 75 mg daily = 225 mg      . DISCONTD: insulin aspart protamine-insulin aspart (NOVOLOG 70/30) (70-30) 100 UNIT/ML injection Inject 150 Units into the skin at  bedtime. If blood sugar before dinner is less than 100, take 50 units of insulin If blood sugar before dinner is less than 150, take 100 units of insulin If blood sugar before dinner is greater than 150, take full dose      . DISCONTD: Alum & Mag Hydroxide-Simeth (MAGIC MOUTHWASH W/LIDOCAINE) SOLN Take 5 mLs by mouth 3 (three) times daily as needed.  100 mL  0   Review Of Systems: Per HPI with the following additions: Worsening eczematous rash on bilateral hands; New falls within the past 6 mo, and falling onto her knees; relapsing/remitting low back pain that occasionally is severe; knee problems; treadmill test in recent past with 40% block in one artery; somewhat a brittle diabetic with recent BG down to 60s.  Otherwise 12 point review of systems was performed and was unremarkable.  Physical Exam: BP 143/55  Pulse 69  Temp 98.7 F (37.1 C) (Oral)  Resp 24  Ht 5' 9"  (1.753 m)  Wt 339 lb 9.6 oz (154.042 kg)  BMI 50.15 kg/m2  SpO2 92% Exam: General: Pleasant, obese female sitting in bed in gown in NAD HEENT: Normocephalic, MMM, EOMI Cardiovascular: Distant heart sounds, RRR Respiratory: CTAB, no wheezes, rales, ronchi, no increased WOB Abdomen: Soft/nontender/nondistended, obese, with NABS and no organomegaly noted Extremities: moving all four extremities symmetrically; right leg with erythema up to knee and uclerated area with increased erythema and moderate amount of swelling at mid-shin, not draining currently Skin: Generalized scabbed areas, worse at bilateral extensor surfaces of hands, right worse than left, appears like eczematous rash Neuro: alert and oriented x 3  Labs and Imaging: CBC BMET   Lab 01/06/12 1600  WBC 8.9  HGB 12.5  HCT 40.8  PLT 292    Lab 01/06/12 1600  NA 142  K 3.6  CL 104  CO2 28  BUN 10  CREATININE 0.81  GLUCOSE 75  CALCIUM 9.8     Right tib-fib x-ray: Focal cortical prominence of the anterior mid tibial diaphysis.  This was present  previously and appears quite similar. Though not  favored, chronic osteomyelitis cannot be excluded.  Conni Slipper, MD 01/06/2012, 10:01 PM   PGY-2 addendum. Agree with excellent note of PGY-1.  Briefly  Mrs. Rieger is a 56 y/o with hx of chronic bilateral venostasis changes and new cellulitis of right LE. This cellulitis is concerning due to the extension and also the possibility of Osteomyelitis. Xrays at [facility] showed unchanged cortical prominence of tibia but can't exclude osteo. We will treat with broad spectrum abx and MRSA coverage and will f/u clinically. MRI would be the next step to determine depth of infection into bone tissue. For her other medical conditions will continue during hospitalization her ambulatory regimen except her DM treatment. Pt has mentioned that Glucose were low at home and has been here below 100. We know that in an infection tight glucose control is preferred but we are concern of her glucose levels even without taking her insulin today. Will start here only SSI moderated and adjust as needed. F/u A1C tomorrow to restart if needed home 70/30 regimen.  D. [place] Philippa Sicks, MD Family Medicine  PGY-2

## 2012-01-07 ENCOUNTER — Inpatient Hospital Stay (HOSPITAL_COMMUNITY): Payer: Medicaid Other

## 2012-01-07 LAB — CBC
Hemoglobin: 10.8 g/dL — ABNORMAL LOW (ref 12.0–15.0)
MCH: 23.9 pg — ABNORMAL LOW (ref 26.0–34.0)
MCH: 24.6 pg — ABNORMAL LOW (ref 26.0–34.0)
MCV: 79.5 fL (ref 78.0–100.0)
MCV: 80.3 fL (ref 78.0–100.0)
Platelets: 293 10*3/uL (ref 150–400)
RBC: 4.51 MIL/uL (ref 3.87–5.11)
RBC: 4.79 MIL/uL (ref 3.87–5.11)

## 2012-01-07 LAB — BASIC METABOLIC PANEL
CO2: 26 mEq/L (ref 19–32)
Calcium: 8.8 mg/dL (ref 8.4–10.5)
Glucose, Bld: 237 mg/dL — ABNORMAL HIGH (ref 70–99)
Potassium: 3.4 mEq/L — ABNORMAL LOW (ref 3.5–5.1)
Sodium: 140 mEq/L (ref 135–145)

## 2012-01-07 LAB — GLUCOSE, CAPILLARY: Glucose-Capillary: 228 mg/dL — ABNORMAL HIGH (ref 70–99)

## 2012-01-07 MED ORDER — POTASSIUM CHLORIDE CRYS ER 20 MEQ PO TBCR
20.0000 meq | EXTENDED_RELEASE_TABLET | Freq: Once | ORAL | Status: AC
  Administered 2012-01-07: 20 meq via ORAL
  Filled 2012-01-07: qty 1

## 2012-01-07 MED ORDER — INSULIN GLARGINE 100 UNIT/ML ~~LOC~~ SOLN
50.0000 [IU] | Freq: Two times a day (BID) | SUBCUTANEOUS | Status: DC
  Administered 2012-01-07 – 2012-01-09 (×5): 50 [IU] via SUBCUTANEOUS

## 2012-01-07 MED ORDER — METRONIDAZOLE IN NACL 5-0.79 MG/ML-% IV SOLN
500.0000 mg | Freq: Three times a day (TID) | INTRAVENOUS | Status: DC
  Administered 2012-01-07 – 2012-01-08 (×3): 500 mg via INTRAVENOUS
  Filled 2012-01-07 (×6): qty 100

## 2012-01-07 MED ORDER — LIVING WELL WITH DIABETES BOOK
Freq: Once | Status: AC
  Administered 2012-01-07: 13:00:00
  Filled 2012-01-07: qty 1

## 2012-01-07 MED ORDER — VANCOMYCIN HCL 1000 MG IV SOLR
2000.0000 mg | Freq: Once | INTRAVENOUS | Status: AC
  Administered 2012-01-07: 2000 mg via INTRAVENOUS
  Filled 2012-01-07: qty 2000

## 2012-01-07 MED ORDER — SODIUM CHLORIDE 0.9 % IV SOLN
750.0000 mg | Freq: Once | INTRAVENOUS | Status: DC
  Filled 2012-01-07: qty 750

## 2012-01-07 MED ORDER — VANCOMYCIN HCL 1000 MG IV SOLR
1500.0000 mg | Freq: Two times a day (BID) | INTRAVENOUS | Status: DC
  Administered 2012-01-08 – 2012-01-09 (×3): 1500 mg via INTRAVENOUS
  Filled 2012-01-07 (×4): qty 1500

## 2012-01-07 MED ORDER — GADOBENATE DIMEGLUMINE 529 MG/ML IV SOLN
20.0000 mL | Freq: Once | INTRAVENOUS | Status: AC
  Administered 2012-01-07: 20 mL via INTRAVENOUS

## 2012-01-07 NOTE — Progress Notes (Signed)
Referral received for diet concerns.  Will see and talk with patient and print out ed'l materials on diet choices.  Also ordered RD consult to talk with patient regarding nutrtional requirements. Ordered assistance for pt to watch diabetes videos on nutrition and glucose control and chronic complications of diabetes Will follow glucose levels as well.  Please order HgbA1C, as there is no documented value since April of this year with a result of 9.3%. Thank you, Rosita Kea, RN, CNS, Diabetes Coordinator [phone])

## 2012-01-07 NOTE — Progress Notes (Signed)
PCP Social Visit Note [name], MD, PGY-2  I have seen patient and discussed current hospitalization. I agree with the excellent care provided by the primary team of [facility] and want to thank them for their continued efforts in caring for [name] Finland, MD, PGY1 01/07/2012 1:04 PM

## 2012-01-07 NOTE — H&P (Signed)
[facility] Attending Admission Note: Dorcas Mcmurray MD [phone] pager office [phone] I  have seen and examined this patient this morning, reviewed their chart. I have discussed this patient with the resident. I agree with the resident's findings, assessment and care plan. I am a little concerned that the area of her recurrent ulcer is evidently at teh site of a 'birthmark". On exam  Today, the whole right shin is red, macerated and any lesions poorly demarcated. My concern would be if this is an underlying neoplasm (Squamous cell etc) that has been unrecognized. I am not sure how to further define this. I have discussed with teh inpatient team and attending, Dr. Andria Frames. I agree with treatment of cellulitis and MRI to further eval her bony abnormality. At some point she might benefit from a small skin punch biopsy.

## 2012-01-07 NOTE — Care Management Note (Signed)
  Page 2 of 2   01/09/2012     2:46:18 PM   CARE MANAGEMENT NOTE 01/09/2012  Patient:  [name], [name]   Account Number:  [id number]  Date Initiated:  01/07/2012  Documentation initiated by:  Magdalen Spatz  Subjective/Objective Assessment:   likely chronic osteomyelitis     Action/Plan:   Admit to [facility] inpatient service for cellulitis, possibly osteomyelitis; upon d/c, discharge pt on vancomycin+(levaquin or avelox or carbepenem) with home health   Anticipated DC Date:  01/09/2012   Anticipated DC Plan:  [place]  CM consult      Choice offered to / List presented to:             Status of service:  Completed, signed off Medicare Important Message given?   (If response is "NO", the following Medicare IM given date fields will be blank) Date Medicare IM given:   Date Additional Medicare IM given:    Discharge Disposition:  HOME/SELF CARE  Per UR Regulation:  Reviewed for med. necessity/level of care/duration of stay  If discussed at [place] of Stay Meetings, dates discussed:    Comments:    01-09-12 PT recommending rolling walker.  Patietn concerned regarding cost. Corinne Ports with [facility] . A 2 wheel rolling walking costs $64.00.  Patient denied. States her husband has a 4 wheel walker at home that he does not use.  PT showing patient how to adjust walker to correct height. Magdalen Spatz RN BSN [phone]  01-09-12 Patient refusing home health at present. MD aware. MD will discuss home health with patient again at her follow up appointment. Magdalen Spatz RN BSN [phone]   01-07-12 Spoke with patient at bedside. Patient waiting on MRI to be done this afternoon.  Mentioned possibilty of going home with home health RN for IV antibiotics ( noted in progress note ) . Patient stated she didn't think that is necessary , but agreeable if needed. Patient lives in [place] , list of [place] in [place] given to patient . Patient  wants [place] , if home health needed at discharge.  Face sheet in EPIC is correct.  Patietn lives with her husband who has MS.  Patient's daughter lives nine miles away from patient.  Patient feels like she could adminster IV antibiotics to her self at home, but if she needed assistance her daughter would be available.  Will continue to follow.  Magdalen Spatz RN BSN [phone]

## 2012-01-07 NOTE — Progress Notes (Signed)
Discussed in rounds.  Seen today by attending, Dr. Nori Riis.  Agree with treatment for cellulitis, possible osteo and work up as described by Dr. Darene Lamer.

## 2012-01-07 NOTE — Progress Notes (Signed)
PGY-1 Daily Progress Note Family Medicine Teaching Service Conni Slipper, MD Service Pager: [phone]   Subjective: Patient doing well today, with continued pain and nervous about leg  (did not sleep overnight)  Objective:  VITALS Temp:  [97.8 F (36.6 C)-98.7 F (37.1 C)] 98.3 F (36.8 C) (07/16 0626) Pulse Rate:  [63-75] 75  (07/16 0626) Resp:  [16-24] 20  (07/16 0626) BP: (137-154)/(45-55) 140/54 mmHg (07/16 0626) SpO2:  [92 %-95 %] 94 % (07/16 0626) Weight:  [330 lb (149.687 kg)-339 lb 9.6 oz (154.042 kg)] 339 lb 9.6 oz (154.042 kg) (07/15 1951)  In/Out  Intake/Output Summary (Last 24 hours) at 01/07/12 0950 Last data filed at 01/07/12 0650  Gross per 24 hour  Intake 1761.66 ml  Output   1375 ml  Net 386.66 ml    Physical Exam: Exam:  General: Pleasant, obese female sitting in bed in gown in NAD  HEENT: Normocephalic, EOMI Cardiovascular: Distant heart sounds secondary to habitus, RRR  Respiratory: CTAB, no wheezes, rales, ronchi, no increased WOB  Abdomen: Soft/nontender/nondistended, obese, with NABS and no organomegaly noted  Extremities: moving all four extremities symmetrically; right leg with erythema up to knee and shallowly uclerated area with increased erythema and moderate amount of swelling at mid-shin, not draining currently  Skin: Generalized scabbed areas, worse at bilateral extensor surfaces of hands, right worse than left, appears like eczematous rash  Neuro: alert and oriented x 3   MEDS - reviewed   Labs and imaging:   CBC  Lab 01/07/12 0555 01/07/12 0002 01/06/12 1600  WBC 7.1 7.4 8.9  HGB 10.8* 11.8* 12.5  HCT 36.2 38.1 40.8  PLT 231 293 292   BMET/CMET  Lab 01/07/12 0555 01/07/12 0002 01/06/12 1600  NA 140 -- 142  K 3.4* -- 3.6  CL 103 -- 104  CO2 26 -- 28  BUN 12 -- 10  CREATININE 0.84 0.83 0.81  CALCIUM 8.8 -- 9.8  PROT -- -- --  BILITOT -- -- --  ALKPHOS -- -- --  ALT -- -- --  AST -- -- --  GLUCOSE 237* -- 75   Dg  Tibia/fibula Right  01/06/2012  RIGHT TIBIA AND FIBULA - 2 VIEW  -  IMPRESSION: Focal cortical prominence of the anterior mid tibial diaphysis. This was present previously and appears quite similar.  Though not favored, chronic osteomyelitis cannot be excluded.  Original Report Authenticated By: Jules Schick, M.D.   Wound culture - no wbc, squamous cells, or organisms  Assessment  [name] is a 56 y.o. year old female presenting with about 1 week of increased right leg pain and swelling.  1. Right leg cellulitis - Given x-ray in [facility] showing possible osteomyelitis and our x-ray showing likely chronic osteomyelitis with currently worsened symptoms, high index of suspicion for osteomyelitis v. Cellulitis  1. Had one dose of cipro in [facility]; Start Vanc/Levaquin for empiric broad-spectrum coverage, dosing per pharmacy (patient allergic to keflex with hives so zosyn not a good option) 2. 7/16 Started flagyl 500 mg q8hrs IV for anaerobic coverage 3. F/u MRI right leg to evaluate for deep tissue infection/osteomyelitis 4. Consider orthopedic surgery c/s if +osteomyelitis 5. F/u wound and blood cultures, understanding that already received one dose cipro 6. F/u AM CBC, BMP 7. Regular dressing changes 8. Pain: tylenol prn, norco prn  9. PT/OT c/s once decide if involve ortho or not, to evaluate ambulation with leg pain and any home health needs 2. Eczema - Per patient, worsening  over past few weeks.  1. Clobetasol cream BID to bilateral hands.  3. Diabetes - Pt has h/o DM on metformin and novolog 70/30, with recent hypoglycemic episodes in 60s.  1. Moderate SSI for now + added lantus 50 units BID on 7/16;  2. Holding home metformin and novolog 70/30 F/u HgbA1c  3. Evaluate CBG as treat infection to avoid hypoglycemia with current management 4. Hypercholesterolemia  1. Continue home lipitor 2. Continue home aspirin 81  5. Diabetic Neuropathy  1. Continue home gabapentin  6. Hypertension   1. Continue home metoprolol, quinapril, and triamterene-HCTZ  7. Depression  1. Continue home venlafaxine XR ] 8. Hypoglycemia - lab value of K 3.4 7/16 with no symptoms 1. repleted with KCl 20 meq po x 1; f/u K tomorrow 9. FEN/GI: NS@100cc /hr, Carb modified diet 10. Prophylaxis: Heparin SQ 11. Disposition: Admit to FM inpatient service for cellulitis, possibly osteomyelitis; upon d/c, discharge pt on vancomycin+(levaquin or avelox or carbepenem) with home health    Conni Slipper, MD FMTS PGY-1

## 2012-01-07 NOTE — Discharge Summary (Signed)
Discharge Summary 01/09/2012 3:37 PM  [name] DOB: 03-04-1956 MRN: [id number]  Date of Admission: 01/06/2012 Date of Discharge: 01/09/2012   PCP: Garret Reddish, MD Consultants: none  Reason for Admission: Right leg cellulitis with concern for osteomyelitis  Discharge Diagnosis Primary 1. Right leg cellulitis Secondary 1. Eczema 2. Diabetes 3. Hypercholesterolemia 4. Diabetic neuropathy 5. HTN 6. Depression 7. Hypokalemia  Hospital Course: [name] is a 56 y.o. year old female presenting with about 1 week of increased right leg pain and swelling.   1. Right leg cellulitis - Pt had x-ray that could not rule out chronic osteomyelitis and was admitted with worsened symptoms. Received one dose of ciprofloxacin in the [facility] and on admission at [facility] was started on vancomycin/levaquin for empiric broad-spectrum coverage (pt allergic to keflex with hives in past so did not give zosyn).  Started flagyl for anaerobic coverage.  MRI right leg done and was negative for osteomyelitis.  Wound culture showed methicillin sensitive staph aureus.  BCx x 2 were NGTD.  Pt remained afebrile and with stable blood counts through hospital course and wound was dressed regularly.  Pt given norco prn pain.  PT consult recommended home health PT for gait/balance, but patient did not have the financial resources for this and therefore wanted to reconsider it as an outpatient.  She was discharged on PO augmentin x 14 days for staph aureus and gm negative coverage (h/o keflex allergy, but on questioning, pt stated this was >30 years ago and she is not even sure it is an allergy for her anymore), norco prn pain, and close PCP f/u and patient states she will continue to visit the [place].  Given the chronicity of this area of her skin that she calls a "birthmark" giving her issues, consider punch biopsy to evaluate for neoplasm as outpatient.  Pt to discuss PT with PCP.  Social work also  discussed Insurance underwriter with patient. Eczema - Per patient, worsening over past few weeks. Gave pt clobetasol cream BID to bilateral hands with significant improvement.  Discontinued this medication on day of discharge to avoid causing steroid-induced atrophy.  Re-evaluate as outpatient. 2. Diabetes - Pt has h/o DM on metformin and novolog 70/30, with recent hypoglycemic episodes in 60s, with A1c 9.1.  Kept patient on SSI during hospital course and added lantus 50 units BID.  Held home metformin after contrast studies as well as 70/30 novolog.  On discharge, resumed home metformin and home dose of novolog 70/30 and discontinued SSI and lantus.  Re-evaluate as outpatient as patient has elevated A1c. 3. Hypercholesterolemia - Pt stable on continued home  lipitor and home aspirin 81. 4. Diabetic Neuropathy - Continued home gabapentin.  On day of discharge, pt was requesting increased gabapentin dose and will follow this up with PCP. 5. Hypertension - Continued home metoprolol, quinapril, and triamterene-HCTZ with pt remaining stable. 6. Depression - Continued home venlafaxine XR with pt remaining stable.  Pt was anxious about leg throughout hospitalization and on occasion was having a tough time feeling like she was able to take deep breaths.  She was given an incentive spirometer and requested taking this home.   7. Hypokalemia - Pt had lab value of K+ 3.4 on 7/16 with no symptoms and was repleted with KCl 49mq PO x 1 with improvement in labs.  Physical exam: BP 157/52  Pulse 59  Temp 97.9 F (36.6 C) (Oral)  Resp 18  Ht 5' 9"  (1.753 m)  Wt 339 lb 9.6 oz (154.042 kg)  BMI 50.15 kg/m2  SpO2 96% Exam:  General: Pleasant, obese female sitting in bed in gown in NAD  HEENT: Normocephalic, EOMI  Cardiovascular: Distant heart sounds secondary to habitus, RRR  Respiratory: CTAB, no wheezes, rales, ronchi, no increased WOB  Abdomen: Soft/nontender/nondistended, obese, with NABS and no  organomegaly noted  Extremities: moving all four extremities symmetrically; right lower leg wrapped Skin: Generalized scabbed eczematous areas on extensor surface of hands that look much improved  Neuro: alert and oriented x 3  Procedures: None  Discharge Medications Medication List  As of 01/09/2012  3:37 PM   START taking these medications         amoxicillin-clavulanate 875-125 MG per tablet   Commonly known as: AUGMENTIN   Take 1 tablet by mouth 2 (two) times daily.      HYDROcodone-acetaminophen 5-325 MG per tablet   Commonly known as: NORCO/VICODIN   Take 1-2 tablets by mouth every 4 (four) hours as needed.         CONTINUE taking these medications         aspirin EC 81 MG tablet      atorvastatin 40 MG tablet   Commonly known as: LIPITOR      gabapentin 300 MG capsule   Commonly known as: NEURONTIN      insulin aspart protamine-insulin aspart (70-30) 100 UNIT/ML injection   Commonly known as: NOVOLOG 70/30      metFORMIN 1000 MG tablet   Commonly known as: GLUCOPHAGE      metoprolol succinate 50 MG 24 hr tablet   Commonly known as: TOPROL-XL      ONE TOUCH ULTRA MINI W/DEVICE Kit   Use glucometer for home glucose monitoring, one time daily      quinapril 40 MG tablet   Commonly known as: ACCUPRIL      torsemide 20 MG tablet   Commonly known as: DEMADEX      triamterene-hydrochlorothiazide 37.5-25 MG per tablet   Commonly known as: MAXZIDE-25      venlafaxine XR 75 MG 24 hr capsule   Commonly known as: EFFEXOR-XR          Where to get your medications    These are the prescriptions that you need to pick up.   You may get these medications from any pharmacy.         amoxicillin-clavulanate 875-125 MG per tablet   HYDROcodone-acetaminophen 5-325 MG per tablet            Pertinent Hospital Labs CBC    Component Value Date/Time   WBC 5.8 01/09/2012 0500   RBC 4.88 01/09/2012 0500   HGB 11.8* 01/09/2012 0500   HCT 38.9 01/09/2012 0500   PLT  216 01/09/2012 0500   MCV 79.7 01/09/2012 0500   MCH 24.2* 01/09/2012 0500   MCHC 30.3 01/09/2012 0500   RDW 15.7* 01/09/2012 0500   LYMPHSABS 2.0 01/06/2012 1600   MONOABS 0.7 01/06/2012 1600   EOSABS 0.4 01/06/2012 1600   BASOSABS 0.1 01/06/2012 1600    BMET    Component Value Date/Time   NA 138 01/08/2012 0550   K 4.1 01/08/2012 0550   CL 101 01/08/2012 0550   CO2 26 01/08/2012 0550   GLUCOSE 209* 01/08/2012 0550   GLUCOSE 358* 04/09/2006 0904   BUN 12 01/08/2012 0550   CREATININE 0.80 01/08/2012 0550   CREATININE 0.86 04/15/2011 1553   CALCIUM 9.4 01/08/2012 0550   GFRNONAA 80* 01/08/2012 0550  GFRAA >90 01/08/2012 0550   A1c - 9.1  MRI Tibia/fibula Right - IMPRESSION:  1. Findings compatible with cellulitis of the right lower leg.  Milder degree of subcutaneous edema in the left lower leg could be  due to dependent change and/or cellulitis. Negative for abscess or  osteomyelitis.  2. Bilateral fatty atrophy of the musculature of the lower legs  most consistent with disuse.  Discharge instructions: see AVS  Condition at discharge: stable  Disposition:  Home  Pending Tests: None  Follow up: Follow-up Information    Follow up with Garret Reddish, MD on 01/14/2012. (3pm)    Contact information:   [address] [phone]          Follow up Issues:   - Discuss PT as outpatient - PT recommended Home Health PT but patient was worried about cost.  - Pt was planning to apply for medicaid, with help from SW.  - Make sure pt taking 2 wk course of augmentin, no side effects  - Consider punch biopsy of right leg "birthmark"   - DM control with elevated A1c.  - The patient may benefit from information regarding [employer].

## 2012-01-07 NOTE — Progress Notes (Signed)
Nutrition Brief Note:  RD noted consult for diet education.  Attempted to meet with pt this afternoon, however not available.  RD will check back with pt tomorrow.  Brynda Greathouse, MS RD LDN Clinical Inpatient Dietitian Pager: [phone] Weekend/After hours pager: [phone]

## 2012-01-07 NOTE — Progress Notes (Signed)
Inpatient Diabetes Program Recommendations  AACE/ADA: New Consensus Statement on Inpatient Glycemic Control (2009)  Target Ranges:  Prepandial:   less than 140 mg/dL      Peak postprandial:   less than 180 mg/dL (1-2 hours)      Critically ill patients:  140 - 180 mg/dL   Reason for Visit: Insulin management while in the hospital.  Pt takes 70/30 insulin at home which consists of 70% basal NPH and 30% either Regular or Novolog meal coverage.   Pt takes 70/30 insulin before breakfast at home, dosage according to fasting glucose: If fasting cbg is less than 100, pt takes 75 units 70/30, if cbg is 100 - 150, pt takes 115 units, if cbg is greater than 150 mg/dL, pt takes 230 units. Basal insulin portion of 70/30 ranges from 50 units (NPH) to 153 units basal (NPH). Pt ordered a total of 100 units basal Lantus which exceeds max amount of highest dose of 70/30. Lantus alone does not have peak times in order to cover meals/po intake.  Would recommend giving 50 units of Lantus once a day and addition of Novolog meal coverage of 8 units tidwc to start in addition to the present correction scale ordered. I am concerned that 100 units of basal insulin may be too high and cause fasting hypoglycemia. Will follow and glad to assist in any way.  Note: Thank you, Rosita Kea, RN, CNS, Diabetes Coordinator [phone])

## 2012-01-07 NOTE — Progress Notes (Signed)
[facility]  NAME:  [name], [name]                 ACCOUNT NO.:  [id number]  MEDICAL RECORD NO.:  [id number]      DATE OF BIRTH:  10/25/1955  PHYSICIAN:  Theodoro Kos, DO       VISIT DATE:  01/06/2012                                  OFFICE VISIT   The patient is a patient of Dr. Dellia Nims.  He sees her for the right lower extremity diabetic ulcer.  She had a wrap this past week and had a little bit of redness.  She comes in today for a nurse check with significant increase in the redness and the depth of the ulceration.  It is warm to touch, swollen, extremely tender, erythematous, and cellulitic in nature.  She does not have a Homans sign but is swollen up to her knee from her toes up with the upper middle and lower third of her lower leg involved.  MEDICATIONS:  As a review, her medications include aspirin, Lipitor, tramadol, gabapentin, insulin, metformin, Toprol, Effexor, hydrochlorothiazide, and she was on Septra back in April.  ALLERGIES:  Include KEFLEX and NEOSPORIN.  PAST MEDICAL HISTORY:  MRSA, hypertension, diabetes, neuropathy, coronary artery disease, peripheral vascular disease.  PAST SURGICAL HISTORY:  Include right knee surgery, tonsillectomy, and rotator cuff surgery.  Her cultures from a week ago show moderate E. coli with few group B strep and she is susceptible to Cipro, resistant to Bactrim and gentamicin and ampicillin.  We did not give her the prescription for Cipro but sent her to the emergency room for further evaluation and had discussion with her about the need for IV antibiotics in order to prevent this from getting much worse and leading to an amputation and we will see her back in a week.     Lyndee Leo Sanger, DO     CS/MEDQ  D:  01/06/2012  T:  01/07/2012  Job:  [id number]

## 2012-01-08 LAB — BASIC METABOLIC PANEL
CO2: 26 mEq/L (ref 19–32)
Calcium: 9.4 mg/dL (ref 8.4–10.5)
Creatinine, Ser: 0.8 mg/dL (ref 0.50–1.10)
GFR calc non Af Amer: 80 mL/min — ABNORMAL LOW (ref >90)
Glucose, Bld: 209 mg/dL — ABNORMAL HIGH (ref 70–99)
Sodium: 138 mEq/L (ref 135–145)

## 2012-01-08 LAB — CBC
HCT: 38.6 % (ref 36.0–46.0)
Hemoglobin: 11.7 g/dL — ABNORMAL LOW (ref 12.0–15.0)
MCH: 24.1 pg — ABNORMAL LOW (ref 26.0–34.0)
MCHC: 30.3 g/dL (ref 30.0–36.0)
MCV: 79.6 fL (ref 78.0–100.0)
Platelets: 230 K/uL (ref 150–400)
RBC: 4.85 MIL/uL (ref 3.87–5.11)
RDW: 15.8 % — ABNORMAL HIGH (ref 11.5–15.5)
WBC: 6.1 K/uL (ref 4.0–10.5)

## 2012-01-08 LAB — GLUCOSE, CAPILLARY
Glucose-Capillary: 190 mg/dL — ABNORMAL HIGH (ref 70–99)
Glucose-Capillary: 249 mg/dL — ABNORMAL HIGH (ref 70–99)

## 2012-01-08 MED ORDER — LEVOFLOXACIN 750 MG PO TABS
750.0000 mg | ORAL_TABLET | Freq: Every day | ORAL | Status: DC
  Administered 2012-01-09: 750 mg via ORAL
  Filled 2012-01-08 (×4): qty 1

## 2012-01-08 MED ORDER — METRONIDAZOLE 500 MG PO TABS
500.0000 mg | ORAL_TABLET | Freq: Three times a day (TID) | ORAL | Status: DC
  Administered 2012-01-08 – 2012-01-09 (×3): 500 mg via ORAL
  Filled 2012-01-08 (×6): qty 1

## 2012-01-08 NOTE — Progress Notes (Signed)
PGY-1 Daily Progress Note Family Medicine Teaching Service Conni Slipper, MD Service Pager: [phone]   Subjective: Patient doing well today, though complains that nervous about leg prognosis and of hands being itchy, requesting benadryl.  Objective:  VITALS Temp:  [97.7 F (36.5 C)-98.6 F (37 C)] 98.6 F (37 C) (07/17 0553) Pulse Rate:  [56-64] 56  (07/17 0553) Resp:  [18-20] 20  (07/17 0553) BP: (120-175)/(53-65) 175/62 mmHg (07/17 0553) SpO2:  [90 %-95 %] 94 % (07/17 0553)  In/Out  Intake/Output Summary (Last 24 hours) at 01/08/12 0947 Last data filed at 01/08/12 0600  Gross per 24 hour  Intake 2236.67 ml  Output      0 ml  Net 2236.67 ml    Physical Exam: Exam:  General: Pleasant, obese female sitting in bed in gown in NAD  HEENT: Normocephalic, EOMI Cardiovascular: Distant heart sounds secondary to habitus, RRR  Respiratory: CTAB, no wheezes, rales, ronchi, no increased WOB  Abdomen: Soft/nontender/nondistended, obese, with NABS and no organomegaly noted  Extremities: moving all four extremities symmetrically; right leg with erythema that has decreased below knee now to mid-leg; shallowly uclerated area with stable amt of erythema and moderate amount of swelling at mid-shin, minimal drainage  Skin: Generalized scabbed eczematous areas on extensor surface of hands that look much improved  Neuro: alert and oriented x 3   MEDS - reviewed   Labs and imaging:   A1c - 9.1  CBC  Lab 01/08/12 0550 01/07/12 0555 01/07/12 0002  WBC 6.1 7.1 7.4  HGB 11.7* 10.8* 11.8*  HCT 38.6 36.2 38.1  PLT 230 231 293   BMET/CMET  Lab 01/08/12 0550 01/07/12 0555 01/07/12 0002 01/06/12 1600  NA 138 140 -- 142  K 4.1 3.4* -- 3.6  CL 101 103 -- 104  CO2 26 26 -- 28  BUN 12 12 -- 10  CREATININE 0.80 0.84 0.83 --  CALCIUM 9.4 8.8 -- 9.8  PROT -- -- -- --  BILITOT -- -- -- --  ALKPHOS -- -- -- --  ALT -- -- -- --  AST -- -- -- --  GLUCOSE 209* 237* -- 75   Dg  Tibia/fibula Right  01/06/2012  RIGHT TIBIA AND FIBULA - 2 VIEW  -  IMPRESSION: Focal cortical prominence of the anterior mid tibial diaphysis. This was present previously and appears quite similar.  Though not favored, chronic osteomyelitis cannot be excluded.  Original Report Authenticated By: Jules Schick, M.D.   Wound culture - no wbc, squamous cells, or organisms  Assessment  [name] is a 56 y.o. year old female presenting with about 1 week of increased right leg pain and swelling.  1. Right leg cellulitis - Given x-ray in [facility] showing possible osteomyelitis and our x-ray showing likely chronic osteomyelitis with currently worsened symptoms, high index of suspicion for osteomyelitis v. Cellulitis  1. Had one dose of cipro in [facility]; Start Vanc/Levaquin for empiric broad-spectrum coverage, dosing per pharmacy (patient allergic to keflex with hives so zosyn not a good option) 2. 7/16 Started flagyl 500 mg q8hrs IV for anaerobic coverage 3. F/u MRI right leg to evaluate for deep tissue infection/osteomyelitis 4. Consider orthopedic surgery c/s if +osteomyelitis 5. F/u wound and blood cultures, understanding that already received one dose cipro; BCx x 2 NGTD; wound cx with moderate staph aureus 6. F/u AM CBC, BMP 7. Regular dressing changes 8. Pain: tylenol prn, norco prn  9. PT/OT c/s ordered to evaluate ambulation with leg pain and any  home health needs 2. Eczema - Per patient, worsening over past few weeks.  1. Clobetasol cream BID to bilateral hands.  2. Add benadryl prn given itching 3. Diabetes - Pt has h/o DM on metformin and novolog 70/30, with recent hypoglycemic episodes in 60s, with A1c 9.1 1. Moderate SSI for now + added lantus 50 units BID on 7/16;  2. Holding home metformin and novolog 70/30 F/u HgbA1c  3. Evaluate CBG as treat infection to avoid hypoglycemia with current management 4. F/u management as outpatient with elevated A1c 4. Hypercholesterolemia  1. Continue  home lipitor 2. Continue home aspirin 81  5. Diabetic Neuropathy  1. Continue home gabapentin  6. Hypertension  1. Continue home metoprolol, quinapril, and triamterene-HCTZ  7. Depression  1. Continue home venlafaxine XR ] 8. Hypokalemia - lab value of K 3.4 7/16 with no symptoms 1. repleted with KCl 20 meq po x 1 with improvement in labs 9. FEN/GI: NS@100cc /hr, Carb modified diet 10. Prophylaxis: Heparin SQ 11. Disposition: Admit to FM inpatient service for cellulitis, possibly osteomyelitis; upon d/c, discharge pt on vancomycin+(levaquin or avelox or carbepenem) with home health; revisit DM management   Conni Slipper, MD FMTS PGY-1

## 2012-01-08 NOTE — Evaluation (Signed)
Physical Therapy Evaluation Patient Details Name: [name] MRN: [id number] DOB: [birth year] Today's Date: 01/08/2012 Time: 9675-9163 PT Time Calculation (min): 30 min  PT Assessment / Plan / Recommendation Clinical Impression  [name] is 56 y/o female admitted for cellulitis of RLE. Presents to physical therapy today with impaired balance and gait deficits affecting her safety at home (pt reporting multiple falls recently). She will benefit physical therapy in the acute setting for mobility, balance and gait training so as to maximize safety for d/c home. I would also recommend HHPT for f/u. She appears to have a difficult family situation at home where she is the primary caregiver of her husband who has progressive MS and is w/c bound. I have recommended to her to get an aide for her husband to decrease her physical responsibilities (pt reports he is eligible for at least 30 hours a week but is reluctant to take this assistance). I also recommended that she ask her daughter to come help with transfers at home.     PT Assessment  Patient needs continued PT services    Follow Up Recommendations  Home health PT    Barriers to Discharge Decreased caregiver support      Equipment Recommendations  None recommended by PT    Recommendations for Other Services     Frequency Min 3X/week    Precautions / Restrictions Precautions Precautions: Fall Precaution Comments: pt reports multiple falls recently         Mobility  Bed Mobility Bed Mobility: Supine to Sit Supine to Sit: 6: Modified independent (Device/Increase time);HOB elevated (50 degrees) Transfers Transfers: Sit to Stand;Stand to Sit Sit to Stand: 6: Modified independent (Device/Increase time);With upper extremity assist;From bed Stand to Sit: 6: Modified independent (Device/Increase time);With upper extremity assist;To chair/3-in-1;With armrests Ambulation/Gait Ambulation/Gait Assistance: 4: Min guard Ambulation Distance  (Feet): 150 Feet Assistive device: Straight cane Ambulation/Gait Assistance Details: wide base and decreased step length, decreased speed, no overt imbalance but pt does not appear to have great reaction time if she was challenged  Gait Pattern: Step-through pattern;Decreased stride length;Wide base of support    Exercises     PT Diagnosis: Difficulty walking;Abnormality of gait;Acute pain  PT Problem List: Decreased activity tolerance;Impaired sensation;Obesity;Decreased knowledge of use of DME;Decreased mobility;Decreased balance;Pain;Decreased skin integrity PT Treatment Interventions: DME instruction;Gait training;Functional mobility training;Therapeutic activities;Therapeutic exercise;Patient/family education;Neuromuscular re-education;Balance training   PT Goals Acute Rehab PT Goals PT Goal Formulation: With patient Time For Goal Achievement: 01/22/12 Potential to Achieve Goals: Fair Pt will Transfer Bed to Chair/Chair to Bed: with modified independence PT Transfer Goal: Bed to Chair/Chair to Bed - Progress: Goal set today Pt will Ambulate: >150 feet;with modified independence;with least restrictive assistive device;with gait velocity >(comment) ft/second (1.8 ft/sec ) PT Goal: Ambulate - Progress: Goal set today Pt will Perform Home Exercise Program: Independently PT Goal: Perform Home Exercise Program - Progress: Goal set today Additional Goals Additional Goal #1: Pt will demonstrate decreased risk of falls with Berg balance score >/=45/56.  PT Goal: Additional Goal #1 - Progress: Goal set today  Visit Information  Last PT Received On: 01/08/12 Assistance Needed: +1    Subjective Data  Subjective: I've been getting up around the room without my cane.  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 She assists husband to his hover round in the morning but uses hoyer to get him back to bed in the evening. Prior Function Level of Independence: Independent Able to Take Stairs?: Yes (slowly) Driving: Yes (reports some difficulty because of neuropathy) Vocation: On disability Comments: no insurance, reports she has been falling a lot more recently and just finished up time at outpatient physical therapy for balance training but couldn't afford to continue Communication Communication: No difficulties    Cognition  Overall Cognitive Status: Appears within functional limits for tasks assessed/performed Arousal/Alertness: Awake/alert Orientation Level: Appears intact for tasks assessed Behavior During Session: [facility] for tasks performed    Extremity/Trunk Assessment Right Upper Extremity Assessment RUE ROM/Strength/Tone: [facility] for tasks assessed RUE Sensation: WFL - Light Touch;WFL - Proprioception RUE Coordination: WFL - gross/fine motor Left Upper Extremity Assessment LUE ROM/Strength/Tone: WFL for tasks assessed LUE Sensation: WFL - Light Touch;WFL - Proprioception LUE Coordination: WFL - gross/fine motor Right Lower Extremity Assessment RLE ROM/Strength/Tone: WFL for tasks assessed RLE Sensation: History of peripheral neuropathy;Deficits RLE Sensation Deficits: appears able to feel pressure but c/o burning/cold sensation at times and at times feet completely numb RLE Coordination: WFL - gross/fine motor Left Lower Extremity Assessment LLE ROM/Strength/Tone: WFL for tasks assessed LLE Sensation: History of peripheral neuropathy;Deficits LLE Sensation Deficits: appears able to feel pressure but c/o burning/cold  sensation at times and at times feet completely numb LLE Coordination: WFL - gross/fine motor   Balance Static Standing Balance Static Standing - Balance Support: No upper extremity supported Static Standing - Level of Assistance: Other (comment) Static Standing - Comment/# of Minutes: unable to assume full romberg positioning because of tissue approximation; unable to assume tandem stance with RLE in front but able to take a larger step forward and hold it with minA Single Leg Stance - Left Leg: 5  (able to assume SLS but unable to hold past 5 sec) Tandem Stance - Right Leg: 0  (minA)  End of Session PT - End of Session Equipment Utilized During Treatment: Gait belt Activity Tolerance: Patient tolerated treatment well Patient left: in chair;with call bell/phone within reach  GP     [place] 01/08/2012, 12:35 PM

## 2012-01-08 NOTE — Progress Notes (Signed)
Discussed in rounds.  Agree with management by Dr. T. 

## 2012-01-08 NOTE — Progress Notes (Signed)
Inpatient Diabetes Program Recommendations  AACE/ADA: New Consensus Statement on Inpatient Glycemic Control (2009)  Target Ranges:  Prepandial:   less than 140 mg/dL      Peak postprandial:   less than 180 mg/dL (1-2 hours)      Critically ill patients:  140 - 180 mg/dL   Reason for Visit: Hyperglycemia  Inpatient Diabetes Program Recommendations Insulin - Meal Coverage: Please add meal coverage of 6-8units Novolog tidwc please.  Note: Pls disregard note from yesterday, as 100 units of Lantus per day is not causing  Hypoglycemia thus far. However, pt does need meal coverage (percentage of meal coverage at home is 8 to 25 units tidwc. Thank you, Rosita Kea, RN, CNS, Diabetes Coordinator [phone])

## 2012-01-08 NOTE — Progress Notes (Signed)
Nutrition Education Note  RD consulted for nutrition education regarding diabetes.   Lab Results  Component Value Date   HGBA1C 9.1* 01/07/2012    RD provided "Carbohydrate Counting for Diabetes" handout from the Academy of Nutrition and Dietetics. Discussed different food groups and their effects on blood sugar, emphasizing carbohydrate-containing foods. Provided list of carbohydrates and recommended serving sizes of common foods.  Discussed importance of controlled and consistent carbohydrate intake throughout the day. Provided examples of ways to balance meals/snacks and encouraged intake of high-fiber, whole grain complex carbohydrates.  Discussed low CHO snacks with pt. Pt also with wt goals- would like to lose 150 lbs.  Discussed basic principles of healthy eating.  Expect good compliance.  Pt very motivated.  Does not want to have an amputation.  Pt was able to demonstrate knowledge gained through practice with nutrition fact panel and ordering lunch.  Body mass index is 50.15 kg/(m^2). Pt meets criteria for morbid obesity based on current BMI.  Current diet order is Low CHO Modified, patient is consuming approximately 100% of meals at this time. Labs and medications reviewed. No further nutrition interventions warranted at this time. RD contact information provided. If additional nutrition issues arise, please re-consult RD.  Brynda Greathouse, MS RD LDN Clinical Inpatient Dietitian Pager: [phone] Weekend/After hours pager: [phone]

## 2012-01-09 LAB — GLUCOSE, CAPILLARY
Glucose-Capillary: 271 mg/dL — ABNORMAL HIGH (ref 70–99)
Glucose-Capillary: 261 mg/dL — ABNORMAL HIGH (ref 70–99)

## 2012-01-09 LAB — CBC
HCT: 38.9 % (ref 36.0–46.0)
Hemoglobin: 11.8 g/dL — ABNORMAL LOW (ref 12.0–15.0)
MCHC: 30.3 g/dL (ref 30.0–36.0)
RBC: 4.88 MIL/uL (ref 3.87–5.11)

## 2012-01-09 LAB — WOUND CULTURE: Special Requests: NORMAL

## 2012-01-09 MED ORDER — AMOXICILLIN-POT CLAVULANATE 875-125 MG PO TABS: 1.0000 | ORAL_TABLET | Freq: Two times a day (BID) | ORAL | Status: AC

## 2012-01-09 MED ORDER — HYDROCODONE-ACETAMINOPHEN 5-325 MG PO TABS: 1.0000 | ORAL_TABLET | ORAL | Status: AC | PRN

## 2012-01-09 NOTE — Discharge Summary (Signed)
Seen and examined.  Agree with DC as outlined by Dr. Darene Lamer.

## 2012-01-09 NOTE — Progress Notes (Addendum)
Patient wants MD to consider adding Metformin to her med's as she takes at home Thanks  Will restart this 48 hours after her contrast study. [place]

## 2012-01-09 NOTE — Progress Notes (Signed)
Patient discharged to home with instructions and spirometer, verbalized understanding, escorted by daughter.

## 2012-01-09 NOTE — Progress Notes (Addendum)
Physical Therapy Treatment Patient Details Name: [name] MRN: [id number] DOB: 1956/05/06 Today's Date: 01/09/2012 Time: 7092-9574 PT Time Calculation (min): 31 min  PT Assessment / Plan / Recommendation Comments on Treatment Session  Pt tolerated session well and will likely d/c this afternoon. Treatment session focused on pt education about safety and ways to decrease fall risk while out in the community as well as using a RW. Pt verabilized understanding of physical therapist's recommendation to use RW while out in community to decrease fall risk.    Follow Up Recommendations  Outpatient PT (Pt has appointment to see if she qualifies for Medicaid-when pt has insurance, recommend outpatient PT)    Barriers to Discharge        Equipment Recommendations  None recommended by PT (has walker at home (belongs to husband))    Recommendations for Other Services    Frequency Min 3X/week   Plan Discharge plan remains appropriate    Precautions / Restrictions Precautions Precautions: Fall Precaution Comments: pt reports multiple recent falls in the last 8 months Restrictions Weight Bearing Restrictions: No       Mobility  Bed Mobility Details for Bed Mobility Assistance: pt was in bathroom upon presentation Transfers Transfers: Stand to Sit Stand to Sit: 6: Modified independent (Device/Increase time);With upper extremity assist;To bed Details for Transfer Assistance: Modified independent due to increased time to preform tasks Ambulation/Gait Ambulation/Gait Assistance: 5: Supervision Ambulation Distance (Feet): 200 Feet Assistive device: Straight cane;Rolling walker Ambulation/Gait Assistance Details: Pt initially used straight cane but also used RW for increased stability and safety to decrease fall risk. Pt exhibited less lateral trunk lean and increased in speed and stability with RW. Gait Pattern: Step-through pattern;Decreased stride length;Wide base of support      PT  Goals Acute Rehab PT Goals PT Transfer Goal: Bed to Chair/Chair to Bed - Progress: Met PT Goal: Ambulate - Progress: Progressing toward goal PT Goal: Perform Home Exercise Program - Progress: Progressing toward goal  Visit Information  Last PT Received On: 01/09/12 Assistance Needed: +1    Subjective Data  Subjective: I am getting ready to go home.   Cognition  Overall Cognitive Status: Appears within functional limits for tasks assessed/performed Arousal/Alertness: Awake/alert Orientation Level: Appears intact for tasks assessed Behavior During Session: [facility] for tasks performed    Balance  Balance Balance Assessed: Yes  End of Session PT - End of Session Equipment Utilized During Treatment: Gait belt Activity Tolerance: Patient tolerated treatment well Patient left: in bed;with call bell/phone within reach;with nursing in room   GP     Jazari Ober, [place] 01/09/2012, 3:30 PM

## 2012-01-09 NOTE — Progress Notes (Signed)
I have read and agree with the below note.  Shelah Lewandowsky PT, DPT Pager: [phone]

## 2012-01-09 NOTE — Progress Notes (Addendum)
ANTIBIOTIC CONSULT NOTE - FOLLOW UP  Pharmacy Consult for Vancomycin Indication: Cellulitis  Allergies  Allergen Reactions  . Keflex (Cephalexin) Hives  . Adhesive (Tape) Rash  . Neosporin (Neomycin-Bacitracin Zn-Polymyx) Rash    Patient Measurements: Height: 5' 9"  (175.3 cm) Weight: 339 lb 9.6 oz (154.042 kg) IBW/kg (Calculated) : 66.2  Adjusted Body Weight: C  Vital Signs: Temp: 98.2 F (36.8 C) (07/18 0545) Temp src: Oral (07/18 0545) BP: 134/94 mmHg (07/18 0545) Pulse Rate: 66  (07/18 0545) Intake/Output from previous day: 07/17 0701 - 07/18 0700 In: 1160 [P.O.:360; I.V.:800] Out: -  Intake/Output from this shift:    Labs:  Basename 01/09/12 0500 01/08/12 0550 01/07/12 0555 01/07/12 0002  WBC 5.8 6.1 7.1 --  HGB 11.8* 11.7* 10.8* --  PLT 216 230 231 --  LABCREA -- -- -- --  CREATININE -- 0.80 0.84 0.83   Estimated Creatinine Clearance: 124.1 ml/min (by C-G formula based on Cr of 0.8). No results found for this basename: VANCOTROUGH:2,VANCOPEAK:2,VANCORANDOM:2,GENTTROUGH:2,GENTPEAK:2,GENTRANDOM:2,TOBRATROUGH:2,TOBRAPEAK:2,TOBRARND:2,AMIKACINPEAK:2,AMIKACINTROU:2,AMIKACIN:2, in the last 72 hours   Microbiology: Recent Results (from the past 720 hour(s))  CULTURE, BLOOD (ROUTINE X 2)     Status: Normal (Preliminary result)   Collection Time   01/07/12 12:17 AM      Component Value Range Status Comment   Specimen Description BLOOD RIGHT HAND   Final    Special Requests BOTTLES DRAWN AEROBIC AND ANAEROBIC 5CC EA   Final    Culture  Setup Time 01/07/2012 09:03   Final    Culture     Final    Value:        BLOOD CULTURE RECEIVED NO GROWTH TO DATE CULTURE WILL BE HELD FOR 5 DAYS BEFORE ISSUING A FINAL NEGATIVE REPORT   Report Status PENDING   Incomplete   WOUND CULTURE     Status: Normal   Collection Time   01/07/12 12:21 AM      Component Value Range Status Comment   Specimen Description WOUND RIGHT LEG   Final    Special Requests Normal   Final    Gram Stain      Final    Value: NO WBC SEEN     NO SQUAMOUS EPITHELIAL CELLS SEEN     NO ORGANISMS SEEN   Culture     Final    Value: MODERATE STAPHYLOCOCCUS AUREUS     Note: RIFAMPIN AND GENTAMICIN SHOULD NOT BE USED AS SINGLE DRUGS FOR TREATMENT OF STAPH INFECTIONS.   Report Status 01/09/2012 FINAL   Final    Organism ID, Bacteria STAPHYLOCOCCUS AUREUS   Final   CULTURE, BLOOD (ROUTINE X 2)     Status: Normal (Preliminary result)   Collection Time   01/07/12 12:26 AM      Component Value Range Status Comment   Specimen Description BLOOD LEFT HAND   Final    Special Requests BOTTLES DRAWN AEROBIC AND ANAEROBIC 5CC EA   Final    Culture  Setup Time 01/07/2012 09:03   Final    Culture     Final    Value:        BLOOD CULTURE RECEIVED NO GROWTH TO DATE CULTURE WILL BE HELD FOR 5 DAYS BEFORE ISSUING A FINAL NEGATIVE REPORT   Report Status PENDING   Incomplete     Anti-infectives     Start     Dose/Rate Route Frequency Ordered Stop   01/09/12 0000   levofloxacin (LEVAQUIN) tablet 750 mg        750 mg  Oral Daily 01/08/12 1128     01/08/12 1300   vancomycin (VANCOCIN) 1,500 mg in sodium chloride 0.9 % 500 mL IVPB        1,500 mg 250 mL/hr over 120 Minutes Intravenous Every 12 hours 01/07/12 0159     01/08/12 1300   metroNIDAZOLE (FLAGYL) tablet 500 mg        500 mg Oral 3 times per day 01/08/12 1128     01/07/12 1230   metroNIDAZOLE (FLAGYL) IVPB 500 mg  Status:  Discontinued        500 mg 100 mL/hr over 60 Minutes Intravenous Every 8 hours 01/07/12 1115 01/08/12 1126   01/07/12 0215   vancomycin (VANCOCIN) 2,000 mg in sodium chloride 0.9 % 500 mL IVPB        2,000 mg 250 mL/hr over 120 Minutes Intravenous  Once 01/07/12 0214 01/07/12 0428   01/07/12 0200   vancomycin (VANCOCIN) 750 mg in sodium chloride 0.9 % 150 mL IVPB  Status:  Discontinued        750 mg 150 mL/hr over 60 Minutes Intravenous  Once 01/07/12 0158 01/07/12 0213   01/07/12 0000   levofloxacin (LEVAQUIN) IVPB 750 mg  Status:   Discontinued        750 mg 100 mL/hr over 90 Minutes Intravenous Every 24 hours 01/06/12 2356 01/08/12 1126   01/07/12 0000   vancomycin (VANCOCIN) 1,250 mg in sodium chloride 0.9 % 250 mL IVPB  Status:  Discontinued        1,250 mg 166.7 mL/hr over 90 Minutes Intravenous Every 8 hours 01/06/12 2359 01/07/12 0159   01/06/12 1530   ciprofloxacin (CIPRO) IVPB 400 mg        400 mg 200 mL/hr over 60 Minutes Intravenous  Once 01/06/12 1524 01/06/12 1756          Assessment: Ms. Pippen continues on Vancomycin for R LEcellulitis. Patient also on Levaquin. Noted tib/fib MRI is negative for osteo. Patient has been afebrile, WBC is WNL, and wound cx is growing MSSA. Scr has been stable. D/w Dr. Darene Lamer, FPTS is planning on discharging the patient home on PO abx (Levaquin + Flagyl). They plan to d/c Vanc today.  Goal of Therapy:  Vancomycin trough level 10-15 mcg/ml  Plan:  - Continue Vanc for now, FPTS plans to d/c today, so will hold off on checking a trough for now - Consider Bactrim as home antibiotic upon discharge for better activity against MSSA.  Bitha Fauteux K. Posey Pronto, PharmD, BCPS.  Clinical Pharmacist Pager [phone]. 01/09/2012 10:51 AM

## 2012-01-10 LAB — GLUCOSE, CAPILLARY
Glucose-Capillary: 222 mg/dL — ABNORMAL HIGH (ref 70–99)
Glucose-Capillary: 217 mg/dL — ABNORMAL HIGH (ref 70–99)

## 2012-01-13 LAB — CULTURE, BLOOD (ROUTINE X 2)

## 2012-01-14 ENCOUNTER — Encounter: Payer: Self-pay | Admitting: Family Medicine

## 2012-01-14 ENCOUNTER — Ambulatory Visit (INDEPENDENT_AMBULATORY_CARE_PROVIDER_SITE_OTHER): Payer: Medicaid Other | Admitting: Family Medicine

## 2012-01-14 VITALS — BP 146/71 | HR 62 | Ht 69.0 in | Wt 331.0 lb

## 2012-01-14 DIAGNOSIS — L03115 Cellulitis of right lower limb: Secondary | ICD-10-CM

## 2012-01-14 DIAGNOSIS — L02419 Cutaneous abscess of limb, unspecified: Secondary | ICD-10-CM

## 2012-01-14 DIAGNOSIS — L03119 Cellulitis of unspecified part of limb: Secondary | ICD-10-CM

## 2012-01-14 DIAGNOSIS — I10 Essential (primary) hypertension: Secondary | ICD-10-CM

## 2012-01-14 NOTE — Patient Instructions (Addendum)
Dear [name],   It was great to see you today. Thank you for coming to clinic. Please read below regarding the issues that we discussed.   1. I think you should follow up at wound care on Thursday.  2. I am slightly worried that you have not been on the antibiotic but I am encouraged that things have not gotten worse. If you start to develop fever, chills, worsening redness, swelling, I want you to come back in.  3. Please bring all of your medicines in next time so we can review your blood pressure and diabetes regimens.  4. Make sure to use your CPAP.   Please follow up in clinic in 2-4 weeks to follow up with your diabetes. Please call earlier if you have any questions or concerns.   Sincerely,  Dr. Garret Reddish

## 2012-01-15 NOTE — Assessment & Plan Note (Signed)
STRONGLY encouraged patient to fill and take augmentin-patient plans to start first dose tonight after picking up from health department. Patient is at high risk for rehospitalization if she does not take this medication. Fortunately, cellulitis appears stable at this tie.   Advised patient to keep wound care appointment-venous stasis changes as well as cellulitis with previous unna boot  Which patient may benefit from. Due to edema and pour wound healing, will also refill torsemide.   Per d/c summary-consider punch biopsy of right leg "birthmark". Do not think prudent at this time given acute infection.

## 2012-01-15 NOTE — Assessment & Plan Note (Addendum)
Mild elevation today. Patient unclear if taking maxzide-to bring all meds to next visit for review. Will not add another agent until current regimen clear. Did restart torsemide. Consider BMET next visit.   See edema comments from 2012 but plan was for echo at that time which was never completed. Would strongly consider once medicaid available.

## 2012-01-15 NOTE — Progress Notes (Signed)
Subjective:  1. Right leg cellulits0Patient presents for hospital follow up after recent hospitalization for right leg cellullitis. Patient had an MRI at that time which ruled out osteomyeltis. Hospitalized from 7/15 to 7/18. Treated with vanc/levaquin and eventually sent home on 14 days of augmentin for staph aureus and gm negative coverage. Patient states that from 7/19-7/22 she did not take medication as she could not afford it. She now has the resources to purchase it and is planning on doing that today. Patient states despite not being on medicine that she has been afebrile and that wound has been stable.   Patient also has a wound care appointment for Thursday (encouraged patient to folllow up and keep this appointment).   Patient is also still considering PT sessions but needs pending medicaid to be finalized first so asks to defer.   2. HTN-mildly elevated today. Reviewed meds and patient unclear if taking maxzide. Has not been taking torsemide as ran out. No CP/SOB  ROS--See HPI  Past Medical History-smoking status noted: nonsmoker. Reviewed problem list.  Medications- reviewed and updated Chief complaint-noted  Objective:  BP 146/71  Pulse 62  Ht 5' 9"  (1.753 m)  Wt 331 lb (150.141 kg)  BMI 48.88 kg/m2 Gen : NAD, morbidly obese CV: distant heart sounds but no appreciable murmur and regular rate and rhythm Lungs: CTAB  Extremities: shallow ulcerated lesion on lower 1/3 of shin. Mild erythema and warmth extending as far as 2-3 cm above shin. 1+ distal pulses.  Gait: walks with cain Neuro: alert and oriented x 3

## 2012-01-23 ENCOUNTER — Encounter (HOSPITAL_BASED_OUTPATIENT_CLINIC_OR_DEPARTMENT_OTHER): Payer: Medicaid Other | Attending: Internal Medicine

## 2012-01-23 DIAGNOSIS — I872 Venous insufficiency (chronic) (peripheral): Secondary | ICD-10-CM | POA: Insufficient documentation

## 2012-01-23 DIAGNOSIS — E1169 Type 2 diabetes mellitus with other specified complication: Secondary | ICD-10-CM | POA: Insufficient documentation

## 2012-01-23 DIAGNOSIS — L97809 Non-pressure chronic ulcer of other part of unspecified lower leg with unspecified severity: Secondary | ICD-10-CM | POA: Insufficient documentation

## 2012-01-29 ENCOUNTER — Encounter: Payer: Self-pay | Admitting: Family Medicine

## 2012-01-29 ENCOUNTER — Ambulatory Visit (INDEPENDENT_AMBULATORY_CARE_PROVIDER_SITE_OTHER): Payer: Medicaid Other | Admitting: Family Medicine

## 2012-01-29 VITALS — BP 147/76 | HR 71 | Temp 98.0°F | Ht 69.0 in | Wt 333.0 lb

## 2012-01-29 DIAGNOSIS — E1142 Type 2 diabetes mellitus with diabetic polyneuropathy: Secondary | ICD-10-CM

## 2012-01-29 DIAGNOSIS — F3289 Other specified depressive episodes: Secondary | ICD-10-CM

## 2012-01-29 DIAGNOSIS — L03119 Cellulitis of unspecified part of limb: Secondary | ICD-10-CM

## 2012-01-29 DIAGNOSIS — L03115 Cellulitis of right lower limb: Secondary | ICD-10-CM

## 2012-01-29 DIAGNOSIS — I10 Essential (primary) hypertension: Secondary | ICD-10-CM

## 2012-01-29 DIAGNOSIS — E1149 Type 2 diabetes mellitus with other diabetic neurological complication: Secondary | ICD-10-CM

## 2012-01-29 DIAGNOSIS — L02419 Cutaneous abscess of limb, unspecified: Secondary | ICD-10-CM

## 2012-01-29 DIAGNOSIS — R609 Edema, unspecified: Secondary | ICD-10-CM

## 2012-01-29 DIAGNOSIS — F329 Major depressive disorder, single episode, unspecified: Secondary | ICD-10-CM

## 2012-01-29 MED ORDER — METOPROLOL SUCCINATE ER 50 MG PO TB24: 50.0000 mg | ORAL_TABLET | Freq: Every day | ORAL | Status: DC

## 2012-01-29 MED ORDER — VENLAFAXINE HCL ER 75 MG PO CP24: 225.0000 mg | ORAL_CAPSULE | Freq: Every day | ORAL | Status: DC

## 2012-01-29 MED ORDER — TRIAMTERENE-HCTZ 37.5-25 MG PO TABS: 1.0000 | ORAL_TABLET | Freq: Every day | ORAL | Status: DC

## 2012-01-29 NOTE — Patient Instructions (Signed)
Dear Leda Gauze,   It was great to see you today. Thank you for coming to clinic. Please read below regarding the issues that we discussed.   1. For your diabetes:  A. we are going to check sugars 4x per day and record (before breakfast and dinner adn 2 hours after each meal).   B. Insulin before breakfast and before dinner (instead of before bedtime).   C. Continue to take metformin  D. Follow up in 2 weeks.  2. For your blood pressure, we are going to restart the combination pill and see you back in 2 weeks.  3. For your cellulitis, Please make sure you don't have fevers or expanding redness. Come in immediately if this occurs.   Make a visit to talk specifically about your recurrent yeast infections. Please call earlier if you have any questions or concerns.   Sincerely,  Dr. Garret Reddish

## 2012-01-30 ENCOUNTER — Encounter (HOSPITAL_BASED_OUTPATIENT_CLINIC_OR_DEPARTMENT_OTHER): Payer: Self-pay

## 2012-01-30 NOTE — Assessment & Plan Note (Signed)
Educated patient on proper use of novolog 70/30 before dinner instead of before bedtime. Just restarted metformin 2 weeks ago. Plan is to log sugars over next 2 weeks and reeval. Hope with appropriate timing of medication, restarting metformin, may see a1c closer to goal in October on next check.   Need to consider repeat diabetic education classes with patient.

## 2012-01-30 NOTE — Progress Notes (Signed)
Subjective:   1. Cellulitis follow up-patient states she could only afford 5 days of Augmentin. She has been without fever/chills/nausea/vomiting/increasing pain or swelling. She is unable to see if erythema is spreading around the wound because wound care placed her in a unna boot a few days after last visit (no redness above unna boot). . It is to be removed tomorrow at wound care.   2. HTN-blood pressure elevated today. Review of patient's meds reveals that she has been out of Maxzide for at least a month.   3. DIABETES II, high insulin requirement Medications taking and tolerating-yes, does report had been out of metformin until 2 weeks ago. Discussed timing of 70/30 insulin and patient states that she takes it before breakfast and at bedtime and has been doing this for years.   Blood Sugars per patient-fasting-mornings with wide variation of 100-200. Then checas at nighttime and typically low 200s before her insulin shot.   Hypoglycemia symptoms (shaky, sweaty, hungry,  anxious, tremor, palpitations, confusion, behavior change)-yes occasionally after insulin at night     ROS--See HPI  Past Medical History-hyperlipidemia, diabetic neuropathy.  Reviewed problem list.  Medications- reviewed and updated Chief complaint-noted  Objective:  Gen: NAD, obese CV: RRR no mrg Lungs: CTAB MSK: unnaboot in place over right leg with no extending erythema, warmth, induration.    Assessment/Plan: See problem oriented charted  Patient wants to make an appointment to discuss recurrent yeast infections. Admits likely due to poor blood sugar control.

## 2012-01-30 NOTE — Assessment & Plan Note (Signed)
Venous stasis changes. Appears from previous notes concern for heart failure. Will need to reevaluated at future visit-does not appear patient ever received echocardiogram as planned.

## 2012-01-30 NOTE — Assessment & Plan Note (Signed)
Patient was not taking Maxzide. Have refilled that and metoprolol at this time. DId not refill torsemide (patient did not receive any refills last visit)  as appears being used for edema without known heart failure-will need to eval for heart failure per edema section (but once again limited by no insurance-medicaid pending) , otherwise should treat with compression and elevation.

## 2012-01-30 NOTE — Assessment & Plan Note (Signed)
Concerned that patient took abbreviated course after period of not treating. Fortunately, no systemic signs. Patient to follow up with wound care who will alert [place] if any signs of worsening cellulitis.

## 2012-02-12 ENCOUNTER — Ambulatory Visit: Payer: Self-pay | Admitting: Family Medicine

## 2012-02-25 ENCOUNTER — Encounter: Payer: Self-pay | Admitting: Family Medicine

## 2012-02-25 ENCOUNTER — Ambulatory Visit (INDEPENDENT_AMBULATORY_CARE_PROVIDER_SITE_OTHER): Payer: Medicaid Other | Admitting: Family Medicine

## 2012-02-25 VITALS — BP 134/73 | HR 89 | Temp 98.3°F | Ht 69.0 in | Wt 328.0 lb

## 2012-02-25 DIAGNOSIS — E1149 Type 2 diabetes mellitus with other diabetic neurological complication: Secondary | ICD-10-CM

## 2012-02-25 DIAGNOSIS — M1710 Unilateral primary osteoarthritis, unspecified knee: Secondary | ICD-10-CM

## 2012-02-25 DIAGNOSIS — R609 Edema, unspecified: Secondary | ICD-10-CM

## 2012-02-25 DIAGNOSIS — E1142 Type 2 diabetes mellitus with diabetic polyneuropathy: Secondary | ICD-10-CM

## 2012-02-25 DIAGNOSIS — I10 Essential (primary) hypertension: Secondary | ICD-10-CM

## 2012-02-25 NOTE — Assessment & Plan Note (Addendum)
Well controlled when compliant with medications. Still holding torsemide.

## 2012-02-25 NOTE — Assessment & Plan Note (Signed)
Patient currently without symptoms to push me towards echocardiogram. Believe patient needs to wear compression stockings. Would get echo if any worsening SOB, orthopnea, PND, increasing swelling despite compliance.

## 2012-02-25 NOTE — Assessment & Plan Note (Signed)
Poorly controlled based off CBGs. Tempted to go up on insulin but believe diet may be more important factor. Will do diabetes education and make decision for change based off of a1c in octobor.

## 2012-02-25 NOTE — Progress Notes (Signed)
Subjective:   1. Uncontrolled DM-patient instructed to check CBGs 4x daily including AM, before dinner, and postprandial by 2 hours. Has not checked postprandials and did not bring log book as discussed on last visit. Patient states morning CBGs typically 200-300 while before dinner CBGs 100-190. She has not had any repeat episodes of hypoglycemia once switching nighttime insulin to before her meal. She has noted an occasional before dinner reading of 70-100 for which she feels some shakiness, sweatiness, hunger but mid 70s has been her lowest. Admits she likely needs some help with her diet choices and very open to seeing DM educator. Patient also requests inquiry about lap band but after discussion, decides she needs to work on diet and exercise before pursuing this option.   2. HTN-compliant with medication changes without side effects and BP well controlled today. Denies any CP, HA, SOB, blurry vision, LE edema, transient weakness, orthopnea, PND.  BP Readings from Last 3 Encounters:  02/25/12 134/73  01/29/12 147/76  01/14/12 146/71    3. Osteoarthritis of knee-left knee has had worsening pain starting 2 weeks ago which has been improving to baseline. Wants to know if there is anything else she can do besides OTC medicines.   4. Venous stasis changes-unnaboot still not removed. Has not been wearing compression stockings on left leg.   ROS--See HPI  Past Medical History-dperession, OSA, arthritis  Reviewed problem list.  Medications- reviewed and updated Chief complaint-noted  Objective: BP 134/73  Pulse 89  Temp 98.3 F (36.8 C) (Oral)  Ht 5' 9"  (1.753 m)  Wt 328 lb (148.78 kg)  BMI 48.44 kg/m2 Gen: NAD CV: RRR no mrg Lungs: CTAB Ext: unnaboot on right leg, 1+ edema left leg  Assessment/Plan: See problem oriented charted

## 2012-02-25 NOTE — Patient Instructions (Signed)
Your blood pressure looked better today.  I think we can improve your diabetes by getting you back in with diabetic education to help with food choices.  Keep taking your insulin and blood pressure medicines as prescribed.  Once your boot is off, consider the [facility] for water aerobics.  If we can see progress with food choices, exercise, and weight loss, we will continue to have the discussion about lap band or gastric bypass.

## 2012-02-25 NOTE — Assessment & Plan Note (Signed)
Poor control in left knee, encouraged patient to elevate leg and ice it for 1 week and follow up if not improved.

## 2012-02-27 ENCOUNTER — Encounter (HOSPITAL_BASED_OUTPATIENT_CLINIC_OR_DEPARTMENT_OTHER): Payer: Medicaid Other | Attending: Internal Medicine

## 2012-02-27 DIAGNOSIS — I872 Venous insufficiency (chronic) (peripheral): Secondary | ICD-10-CM | POA: Insufficient documentation

## 2012-02-27 DIAGNOSIS — L97809 Non-pressure chronic ulcer of other part of unspecified lower leg with unspecified severity: Secondary | ICD-10-CM | POA: Insufficient documentation

## 2012-02-27 DIAGNOSIS — E119 Type 2 diabetes mellitus without complications: Secondary | ICD-10-CM | POA: Insufficient documentation

## 2012-02-27 LAB — GLUCOSE, CAPILLARY: Glucose-Capillary: 277 mg/dL — ABNORMAL HIGH (ref 70–99)

## 2012-02-28 ENCOUNTER — Ambulatory Visit: Payer: Medicaid Other | Admitting: Dietician

## 2012-03-02 ENCOUNTER — Encounter: Payer: Self-pay | Admitting: Family Medicine

## 2012-03-04 ENCOUNTER — Ambulatory Visit (INDEPENDENT_AMBULATORY_CARE_PROVIDER_SITE_OTHER): Payer: Self-pay | Admitting: Family Medicine

## 2012-03-04 DIAGNOSIS — E119 Type 2 diabetes mellitus without complications: Secondary | ICD-10-CM

## 2012-03-04 DIAGNOSIS — M179 Osteoarthritis of knee, unspecified: Secondary | ICD-10-CM

## 2012-03-04 DIAGNOSIS — F3289 Other specified depressive episodes: Secondary | ICD-10-CM

## 2012-03-04 DIAGNOSIS — M1710 Unilateral primary osteoarthritis, unspecified knee: Secondary | ICD-10-CM

## 2012-03-04 DIAGNOSIS — F329 Major depressive disorder, single episode, unspecified: Secondary | ICD-10-CM

## 2012-03-04 DIAGNOSIS — M545 Low back pain, unspecified: Secondary | ICD-10-CM

## 2012-03-04 DIAGNOSIS — E785 Hyperlipidemia, unspecified: Secondary | ICD-10-CM

## 2012-03-04 DIAGNOSIS — I10 Essential (primary) hypertension: Secondary | ICD-10-CM

## 2012-03-04 NOTE — Patient Instructions (Addendum)
Dear [name],   It was great to see you again today.   1. I am going to resend all your prescriptions to [facility]. 2. Due to your continued knee and back pain, I am going to get you reestablished with your former doctors  Please follow up in clinic in 4 weeks to follow up on your diabetes and other issues. Please call earlier if you have any questions or concerns.   Sincerely,  Dr. Garret Reddish  Health Maintenance Due  Topic Date Due  . Ophthalmology Exam  05/25/2011  . Foot Exam  05/29/2011  . Urine Microalbumin  09/17/2011  . Pap Smear  01/13/2012

## 2012-03-05 DIAGNOSIS — M545 Low back pain, unspecified: Secondary | ICD-10-CM

## 2012-03-05 HISTORY — DX: Low back pain, unspecified: M54.50

## 2012-03-05 LAB — GLUCOSE, CAPILLARY: Glucose-Capillary: 213 mg/dL — ABNORMAL HIGH (ref 70–99)

## 2012-03-05 MED ORDER — METFORMIN HCL 1000 MG PO TABS: 1000.0000 mg | ORAL_TABLET | Freq: Two times a day (BID) | ORAL | Status: DC

## 2012-03-05 MED ORDER — ASPIRIN EC 81 MG PO TBEC: 81.0000 mg | DELAYED_RELEASE_TABLET | Freq: Every day | ORAL | Status: DC

## 2012-03-05 MED ORDER — GABAPENTIN 300 MG PO CAPS: 900.0000 mg | ORAL_CAPSULE | Freq: Three times a day (TID) | ORAL | Status: DC

## 2012-03-05 MED ORDER — METOPROLOL SUCCINATE ER 50 MG PO TB24: 50.0000 mg | ORAL_TABLET | Freq: Every day | ORAL | Status: DC

## 2012-03-05 MED ORDER — AMLODIPINE BESYLATE 10 MG PO TABS: 10.0000 mg | ORAL_TABLET | Freq: Every day | ORAL | Status: DC

## 2012-03-05 MED ORDER — INSULIN ASPART PROT & ASPART (70-30 MIX) 100 UNIT/ML ~~LOC~~ SUSP: 75.0000 [IU] | Freq: Every day | SUBCUTANEOUS | Status: DC

## 2012-03-05 MED ORDER — QUINAPRIL HCL 40 MG PO TABS: 40.0000 mg | ORAL_TABLET | Freq: Every day | ORAL | Status: DC

## 2012-03-05 MED ORDER — ATORVASTATIN CALCIUM 40 MG PO TABS: 80.0000 mg | ORAL_TABLET | Freq: Every day | ORAL | Status: DC

## 2012-03-05 MED ORDER — VENLAFAXINE HCL ER 75 MG PO CP24: 225.0000 mg | ORAL_CAPSULE | Freq: Every day | ORAL | Status: DC

## 2012-03-05 MED ORDER — GLUCOSE BLOOD VI STRP: ORAL_STRIP | Status: DC

## 2012-03-05 MED ORDER — LUMBAR BACK BRACE/SUPPORT PAD MISC: 1.0000 | Freq: Every day | Status: DC

## 2012-03-05 NOTE — Progress Notes (Signed)
Subjective:   1. Back pain-patient reports pain for 5-6 years centered in her low back which can be up to a 7-8/10 sharp pain with standing even for brief periods of time such as doing the dishes. Improved with sitting and putting a pillow behind her back. Patient states had MRI previously which showed degenerative disk disease  And possibly a pinched nerve. She was previously seen by Lonia Farber, MD before she lost insurance who did a rotator cuff surgery and she says she wants a referral to discuss back surgery. Patient hen declines and states that she wants to research to find a doctor that works well for her that takes medicaid and she is unsure. SHe plans to call. Patient is willing to do PT. Patient also aware that weight loss is probably most important to help her pain. Her mother had multiple back surgeries with little benefit and she is concerned that surgery may not help.   2. Knee pain-bilateral knee pain for 10 years.She has to walk with a cane due to her back and knee pain and states she cannot walk very far without stopping for rest due to pain. Patient previously seen by Alphonzo Severance, MD for arthroscopic surgery after a fall. She says he told her she would have ot have knee replacements by the age of 72. Pain is a dull ache 4/10 worse with standing improved with sitting or rest. She would like to reestablish with Dr. Marlou Sa.   3. Refills-patient now has medicaid and needs all medications sent to [place] [facility]. She also requests test strips.   ROS--See HPI  Past Medical History-DM, depression, HLD Reviewed problem list.  Medications- reviewed and updated Chief complaint-noted  Objective: There were no vitals taken for this visit. Gen: NAD CV: RRR no mrg Lungs: CTAB MSK:  Back - Normal skin, Spine with normal alignment and no deformity.  No tenderness to vertebral process palpation.  Paraspinous muscles are not tender and without spasm.   Range of motion is full at neck.  Patient cannot reach her toes by approximately 1 foot.  Knees-full range of motion. Severe crepitus noted. NO joint effusions. Skin normal around knee.   Assessment/Plan: See problem oriented charted

## 2012-03-05 NOTE — Assessment & Plan Note (Signed)
Considering referral to orthopedic surgery per patietn request as previously established with Lonia Farber, MD. Stressed to patient that weight loss and PT may be most effective. Patient willing to try PT and considering back brace. Have ordered back brace and sent to [facility] pharmacy-may need to handwrite at next appointment.

## 2012-03-05 NOTE — Assessment & Plan Note (Signed)
Encouraged weight loss. Referred to Alphonzo Severance, MD of orthopedic surgery as patient previously established with him and wants to follow up.

## 2012-03-06 ENCOUNTER — Telehealth: Payer: Self-pay | Admitting: Family Medicine

## 2012-03-06 ENCOUNTER — Other Ambulatory Visit: Payer: Self-pay | Admitting: *Deleted

## 2012-03-06 DIAGNOSIS — F329 Major depressive disorder, single episode, unspecified: Secondary | ICD-10-CM

## 2012-03-06 DIAGNOSIS — I10 Essential (primary) hypertension: Secondary | ICD-10-CM

## 2012-03-06 DIAGNOSIS — E785 Hyperlipidemia, unspecified: Secondary | ICD-10-CM

## 2012-03-06 DIAGNOSIS — E119 Type 2 diabetes mellitus without complications: Secondary | ICD-10-CM

## 2012-03-06 MED ORDER — GLUCOSE BLOOD VI STRP: ORAL_STRIP | Status: DC

## 2012-03-06 MED ORDER — ASPIRIN EC 81 MG PO TBEC: 81.0000 mg | DELAYED_RELEASE_TABLET | Freq: Every day | ORAL | Status: DC

## 2012-03-06 MED ORDER — VENLAFAXINE HCL ER 75 MG PO CP24: 225.0000 mg | ORAL_CAPSULE | Freq: Every day | ORAL | Status: DC

## 2012-03-06 MED ORDER — METFORMIN HCL 1000 MG PO TABS: 1000.0000 mg | ORAL_TABLET | Freq: Two times a day (BID) | ORAL | Status: DC

## 2012-03-06 MED ORDER — METOPROLOL SUCCINATE ER 50 MG PO TB24: 50.0000 mg | ORAL_TABLET | Freq: Every day | ORAL | Status: DC

## 2012-03-06 MED ORDER — QUINAPRIL HCL 40 MG PO TABS: 40.0000 mg | ORAL_TABLET | Freq: Every day | ORAL | Status: DC

## 2012-03-06 MED ORDER — ATORVASTATIN CALCIUM 40 MG PO TABS: 80.0000 mg | ORAL_TABLET | Freq: Every day | ORAL | Status: DC

## 2012-03-06 MED ORDER — AMLODIPINE BESYLATE 10 MG PO TABS: 10.0000 mg | ORAL_TABLET | Freq: Every day | ORAL | Status: DC

## 2012-03-06 MED ORDER — GABAPENTIN 300 MG PO CAPS: 900.0000 mg | ORAL_CAPSULE | Freq: Three times a day (TID) | ORAL | Status: DC

## 2012-03-06 MED ORDER — INSULIN ASPART PROT & ASPART (70-30 MIX) 100 UNIT/ML ~~LOC~~ SUSP: 75.0000 [IU] | Freq: Every day | SUBCUTANEOUS | Status: DC

## 2012-03-06 NOTE — Telephone Encounter (Signed)
Will have donna check [place] tracks on Monday.  MD informed about placard and DM shoes, he will complete and give back to me. Donella Pascarella, [name]

## 2012-03-06 NOTE — Telephone Encounter (Signed)
Patient is calling with info requested earlier.  The correct MD on the MCD card should be corrected in their computer system by tomorrow and she will get the corrected card by next week.  She also needs a referral/RX for diabetic shoes and she needs a Handicap [place] because hers has expired.

## 2012-03-09 NOTE — Telephone Encounter (Signed)
LM with husband for pt to call back.  Please inform of message from MD as well as let her know that we will process the referral next month.  Unfortunately we see where she has medicaid but our name is not on the card.  Usually these change in the sytem the 1st of every month, we will process it as soon as it changes.[name], [name]

## 2012-03-09 NOTE — Telephone Encounter (Signed)
Placard and DM shoes filled out and placed in front office box for patient to pick up. Nursing to inform patient.

## 2012-03-17 ENCOUNTER — Ambulatory Visit: Payer: Medicaid Other | Attending: Family Medicine | Admitting: Physical Therapy

## 2012-03-17 DIAGNOSIS — M545 Low back pain, unspecified: Secondary | ICD-10-CM | POA: Insufficient documentation

## 2012-03-17 DIAGNOSIS — IMO0001 Reserved for inherently not codable concepts without codable children: Secondary | ICD-10-CM | POA: Insufficient documentation

## 2012-03-17 DIAGNOSIS — M2560 Stiffness of unspecified joint, not elsewhere classified: Secondary | ICD-10-CM | POA: Insufficient documentation

## 2012-03-17 DIAGNOSIS — R293 Abnormal posture: Secondary | ICD-10-CM | POA: Insufficient documentation

## 2012-03-17 DIAGNOSIS — M25569 Pain in unspecified knee: Secondary | ICD-10-CM | POA: Insufficient documentation

## 2012-03-26 ENCOUNTER — Encounter (HOSPITAL_BASED_OUTPATIENT_CLINIC_OR_DEPARTMENT_OTHER): Payer: Medicaid Other

## 2012-03-26 ENCOUNTER — Encounter (HOSPITAL_BASED_OUTPATIENT_CLINIC_OR_DEPARTMENT_OTHER): Payer: Medicaid Other | Attending: Internal Medicine

## 2012-03-26 DIAGNOSIS — L97809 Non-pressure chronic ulcer of other part of unspecified lower leg with unspecified severity: Secondary | ICD-10-CM | POA: Insufficient documentation

## 2012-03-26 DIAGNOSIS — E1169 Type 2 diabetes mellitus with other specified complication: Secondary | ICD-10-CM | POA: Insufficient documentation

## 2012-03-30 ENCOUNTER — Encounter: Payer: Medicaid Other | Admitting: Physical Therapy

## 2012-04-03 ENCOUNTER — Encounter: Payer: Self-pay | Admitting: Family Medicine

## 2012-04-03 ENCOUNTER — Ambulatory Visit (INDEPENDENT_AMBULATORY_CARE_PROVIDER_SITE_OTHER): Payer: Medicaid Other | Admitting: Family Medicine

## 2012-04-03 VITALS — BP 139/61 | HR 73 | Ht 69.0 in | Wt 331.0 lb

## 2012-04-03 DIAGNOSIS — E1149 Type 2 diabetes mellitus with other diabetic neurological complication: Secondary | ICD-10-CM

## 2012-04-03 DIAGNOSIS — Z23 Encounter for immunization: Secondary | ICD-10-CM

## 2012-04-03 DIAGNOSIS — G609 Hereditary and idiopathic neuropathy, unspecified: Secondary | ICD-10-CM

## 2012-04-03 NOTE — Patient Instructions (Addendum)
Make sure to call diabetes education back as this is likely one of the most important things we can do to control your blood sugar.  Schedule an appointment at the front with Dr. Valentina Lucks our pharmacist to see if he can give [place] any extra advice on how to control your blood sugar.   Here is your new sliding scale:  In the morning If fasting blood sugar is less than 100, take 80 units of insulin  If fasting blood sugar is less than 150, take 120 units of insulin  If fasting blood sugar is greater than 150, take full dose of 235 units of insulin.   MAKE SURE TO EAT BREAKFAST LUNCH AND DINNER!!!!!!!!!!!!!!!! Do not eat after midnight.   Before dinner: If blood sugar before dinner is less than 100, take 55 units of insulin  If blood sugar before dinner is less than 150, take 105 units of insulin  If blood sugar before dinner is greater than 150, take full dose of 155 units.  Check blood sugars 4 times daily. Follow up in 2 weeks with blood sugar log.  See me back in 3 months or sooner if having low blood sugars.

## 2012-04-03 NOTE — Progress Notes (Signed)
Subjective:   1. DIABETES Type II Medications taking and tolerating-yes, high doses 230 in AM and 150 in PM with sliding scales Patient admits to missing multiple doses before dinner as she has taken it at bedtime her entire life.  Blood Sugars per patient-fasting-180s-260s. Mealtime at 9pm and before meal 150s-280.  Diet-eating a lot of sweets Regular Exercise-working on getting [employer], no exercise at this time  Last eye exam-unsure Last foot exam-unsure, will perform next visit Last microalbumin/on ace inhibitor-on ace Daily foot monitoring-yes  ROS- (+)Polyuria/Polydipsia/nocturia, (decreased over years, no recent changes) Vision changes, (yes on neurontin) feet or hand numbness/pain/tingling. Hypoglycemia symptoms (shaky, sweaty, hungry,  anxious, tremor, palpitations, confusion, behavior change)-yes when she doesnt eat breakfast or lunch. Has been as low as 55.   Diabetic Labs:  A1c continues to climb today to 9.5 up from 9.1 at last visit.   ROS--See HPI  Past Medical History-smoking status noted: does not smoke.  Reviewed problem list.  Medications- reviewed and updated Chief complaint-noted  Objective: BP 139/61  Pulse 73  Ht 5' 9"  (1.753 m)  Wt 331 lb (150.141 kg)  BMI 48.88 kg/m2 Gen: NAD, morbidly obese CV: RRR no mrg Lungs: CTAB Ext: 1+ pitting edema on both legs, venous stasis changes both legs with slightly red but not particularly warm calves bilaterally R worse than left, patient with bandage over small wound that is healing  Assessment/Plan: See problem oriented charted

## 2012-04-06 MED ORDER — GLUCOSE BLOOD VI STRP: ORAL_STRIP | Status: DC

## 2012-04-06 NOTE — Assessment & Plan Note (Addendum)
Poorly controlled. Have increased very high dose of 70/30 at each meal by 5 units. STrongly encouraged patient to keep schedule of taking insulin before dinner instead of at bedtime. Have some concerns about absorption of insulin at such high doses so will refer to our pharmacy clinic.   Will refer to optho. STRONGLY encouraged patient to keep appointment with diabetes educator (she will have to call) as dietary indiscretions are a huge part of poor control (patient says eats diet high in sweets).

## 2012-04-07 ENCOUNTER — Ambulatory Visit: Payer: Medicaid Other | Attending: Family Medicine | Admitting: Physical Therapy

## 2012-04-07 ENCOUNTER — Ambulatory Visit: Payer: Medicaid Other | Admitting: Physical Therapy

## 2012-04-07 DIAGNOSIS — M2560 Stiffness of unspecified joint, not elsewhere classified: Secondary | ICD-10-CM | POA: Insufficient documentation

## 2012-04-07 DIAGNOSIS — R293 Abnormal posture: Secondary | ICD-10-CM | POA: Insufficient documentation

## 2012-04-07 DIAGNOSIS — M545 Low back pain, unspecified: Secondary | ICD-10-CM | POA: Insufficient documentation

## 2012-04-07 DIAGNOSIS — M25569 Pain in unspecified knee: Secondary | ICD-10-CM | POA: Insufficient documentation

## 2012-04-07 DIAGNOSIS — IMO0001 Reserved for inherently not codable concepts without codable children: Secondary | ICD-10-CM | POA: Insufficient documentation

## 2012-04-09 ENCOUNTER — Ambulatory Visit: Payer: Medicaid Other | Admitting: Pharmacist

## 2012-04-14 ENCOUNTER — Encounter: Payer: Medicaid Other | Admitting: Physical Therapy

## 2012-04-14 ENCOUNTER — Ambulatory Visit: Payer: Medicaid Other | Admitting: Physical Therapy

## 2012-04-17 ENCOUNTER — Encounter: Payer: Self-pay | Admitting: Pharmacist

## 2012-04-17 ENCOUNTER — Ambulatory Visit (INDEPENDENT_AMBULATORY_CARE_PROVIDER_SITE_OTHER): Payer: Medicaid Other | Admitting: Pharmacist

## 2012-04-17 VITALS — BP 144/77 | Ht 68.5 in | Wt 335.1 lb

## 2012-04-17 DIAGNOSIS — E1149 Type 2 diabetes mellitus with other diabetic neurological complication: Secondary | ICD-10-CM

## 2012-04-17 MED ORDER — EXENATIDE 5 MCG/0.02ML ~~LOC~~ SOPN: 5.0000 ug | PEN_INJECTOR | Freq: Two times a day (BID) | SUBCUTANEOUS | Status: DC

## 2012-04-17 NOTE — Patient Instructions (Addendum)
Start Byetta 74mg twice daily prior to meals.  Continue 70/30 insulin same for now.   [facility] Consider eating more green vegetables!  Next visit in 2 weeks with me.

## 2012-04-17 NOTE — Progress Notes (Signed)
Patient ID: [name], female   DOB: 06-19-1956, 56 y.o.   MRN: [id number] Reviewed and agree with Dr. Graylin Shiver documentation and management.
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
## 2012-04-17 NOTE — Assessment & Plan Note (Signed)
Diabetes of 17 yrs duration (7 years on inuslin tx) currently under poor control of blood glucose based on   Lab Results  Component Value Date   HGBA1C 9.5 04/03/2012    ,home fasting CBG readings of 250-350  and random CBG readings of similar level with occasional low readings of 70-90.  Control is suboptimal due to poor regimen for daily routine. Reports mild hypoglycemic events which were appropriately managed.   Able to verbalize appropriate hypoglycemia management plan. Continued 70/30 insulin at this time. Will ADD Byetta and reevaluate after changes in diet, exercise and living situation (husband with MS  moving to care facility) have happened in the next two weeks.   We anticipate changing to a basal and bolus insulin regimen.   Written patient instructions provided.  Consider change from gabapentin to Lyrica at next visit if OK with PCP to minimize sedation. Follow up in  Pharmacist Clinic Visit 2 weeks.   Total time in face to face counseling 59mnutes.  Patient seen with MWenda Low PharmD Candidate and ADewaine Oats OSM IV

## 2012-04-21 ENCOUNTER — Ambulatory Visit: Payer: Medicaid Other | Admitting: Physical Therapy

## 2012-04-25 ENCOUNTER — Encounter: Payer: Self-pay | Admitting: Family Medicine

## 2012-04-28 ENCOUNTER — Ambulatory Visit: Payer: Medicaid Other | Attending: Family Medicine | Admitting: Physical Therapy

## 2012-04-28 DIAGNOSIS — R293 Abnormal posture: Secondary | ICD-10-CM | POA: Insufficient documentation

## 2012-04-28 DIAGNOSIS — IMO0001 Reserved for inherently not codable concepts without codable children: Secondary | ICD-10-CM | POA: Insufficient documentation

## 2012-04-28 DIAGNOSIS — M2560 Stiffness of unspecified joint, not elsewhere classified: Secondary | ICD-10-CM | POA: Insufficient documentation

## 2012-04-28 DIAGNOSIS — M545 Low back pain, unspecified: Secondary | ICD-10-CM | POA: Insufficient documentation

## 2012-04-28 DIAGNOSIS — M25569 Pain in unspecified knee: Secondary | ICD-10-CM | POA: Insufficient documentation

## 2012-05-01 ENCOUNTER — Ambulatory Visit: Payer: Medicaid Other | Admitting: Pharmacist

## 2012-05-01 ENCOUNTER — Encounter: Payer: Medicaid Other | Attending: Family Medicine | Admitting: *Deleted

## 2012-05-01 ENCOUNTER — Encounter: Payer: Self-pay | Admitting: *Deleted

## 2012-05-01 VITALS — Ht 69.0 in | Wt 341.2 lb

## 2012-05-01 DIAGNOSIS — IMO0001 Reserved for inherently not codable concepts without codable children: Secondary | ICD-10-CM | POA: Insufficient documentation

## 2012-05-01 DIAGNOSIS — E1165 Type 2 diabetes mellitus with hyperglycemia: Secondary | ICD-10-CM

## 2012-05-01 DIAGNOSIS — Z713 Dietary counseling and surveillance: Secondary | ICD-10-CM | POA: Insufficient documentation

## 2012-05-01 NOTE — Patient Instructions (Addendum)
Goals:  Aim for 3 Carb Choices (45 grams) per meal +/- 1 either way  Aim for 1-2 Carbs per snack if hungry  Consider reading food labels for total carb of foods in your home  Ask MD for Rx for Accu Chek strips which are covered by Medicaid  Continue checking your BG daily, consider checking at alternate times of day

## 2012-05-01 NOTE — Progress Notes (Signed)
  Medical Nutrition Therapy:  Appt start time: 0945 end time:  1045.  Assessment:  Primary concerns today: patient here for diabetes education. She lives with her invalid husband who is very negative about his life, which is stressful for her. She states history of DM2 for 17 years and her current A1c = 9.5% on 04/03/2012. She stays up late at night watching tv or on the computer and sleeps until late morning. Meal preparation is limited to very simple meals or skipped. She also states history of leg wound, [facility] and she is currently having oozing from the leg wound.  MEDICATIONS: see list. Diabetes medications include Byetta BID, Novolin 70/30 235 units in AM and 155 units in PM and Metformin 1000 mg BID.   DIETARY INTAKE:  Usual eating pattern includes 2-3 meals and stated no snacks per day.  Everyday foods include simple foods including starches and often high fat meats.  Avoided foods include regular soda, and she states she is staying away from chips,.    24-hr recall:  B ( 1-2 PM): forgets to eat often, cereal with fruit, skim OR   Snk ( AM): none  L (8 PM): soup with crackers OR casserole, diet soda or Crystal Light Snk ( PM): chews ice OR occasionally chocolate candy  D (12 AM): PNB or meat sandwich Snk ( PM): none stated Beverages: coffee with cream and Sugar Free [place] Vanilla, Crystal Light, water,   Usual physical activity: cares for invalid husband, would enjoy pool exercises when leg is not oozing,   Estimated energy needs: 1500 calories 170 g carbohydrates 112 g protein 42 g fat  Progress Towards Goal(s):  In progress.   Nutritional Diagnosis:  NB-1.1 Food and nutrition-related knowledge deficit As related to diabetes management.  As evidenced by A1c of 9.5%.    Intervention:  Nutrition counseling and diabetes education initiated. Discussed basic physiology of diabetes, SMBG and rationale of checking BG at alternate times of day, A1c, Carb  Counting and reading food labels, and benefits of increased activity. Also discussed food shopping to have healthy foods in the house to make food preparation easier, and planning meals ahead of time to improve nutritional intake. Goals:  Aim for 3 Carb Choices (45 grams) per meal +/- 1 either way  Aim for 1-2 Carbs per snack if hungry  Consider reading food labels for total carb of foods in your home  Ask MD for Rx for Accu Chek strips which are covered by Medicaid  Continue checking your BG daily, consider checking at alternate times of day   Handouts given during visit include: Living Well with Diabetes Carb Counting and Food Label handouts Meal Plan Card Grocery List Menu Planner  Monitoring/Evaluation:  Dietary intake, exercise, reading food labels, and body weight in 6 weeks.

## 2012-05-02 ENCOUNTER — Encounter: Payer: Self-pay | Admitting: Family Medicine

## 2012-05-04 ENCOUNTER — Encounter: Payer: Self-pay | Admitting: Home Health Services

## 2012-05-05 ENCOUNTER — Encounter: Payer: Self-pay | Admitting: Home Health Services

## 2012-05-07 ENCOUNTER — Encounter (HOSPITAL_BASED_OUTPATIENT_CLINIC_OR_DEPARTMENT_OTHER): Payer: Medicaid Other | Attending: Internal Medicine

## 2012-05-07 DIAGNOSIS — L02419 Cutaneous abscess of limb, unspecified: Secondary | ICD-10-CM | POA: Insufficient documentation

## 2012-05-07 DIAGNOSIS — E119 Type 2 diabetes mellitus without complications: Secondary | ICD-10-CM | POA: Insufficient documentation

## 2012-05-07 DIAGNOSIS — A498 Other bacterial infections of unspecified site: Secondary | ICD-10-CM | POA: Insufficient documentation

## 2012-05-07 DIAGNOSIS — L97809 Non-pressure chronic ulcer of other part of unspecified lower leg with unspecified severity: Secondary | ICD-10-CM | POA: Insufficient documentation

## 2012-05-07 DIAGNOSIS — L03119 Cellulitis of unspecified part of limb: Secondary | ICD-10-CM | POA: Insufficient documentation

## 2012-05-07 DIAGNOSIS — I872 Venous insufficiency (chronic) (peripheral): Secondary | ICD-10-CM | POA: Insufficient documentation

## 2012-05-08 ENCOUNTER — Ambulatory Visit (INDEPENDENT_AMBULATORY_CARE_PROVIDER_SITE_OTHER): Payer: Medicaid Other | Admitting: Pharmacist

## 2012-05-08 VITALS — Ht 68.5 in | Wt 336.0 lb

## 2012-05-08 DIAGNOSIS — E1165 Type 2 diabetes mellitus with hyperglycemia: Secondary | ICD-10-CM

## 2012-05-08 MED ORDER — INSULIN GLARGINE 100 UNIT/ML ~~LOC~~ SOLN: 100.0000 [IU] | Freq: Two times a day (BID) | SUBCUTANEOUS | Status: DC

## 2012-05-08 MED ORDER — EXENATIDE 10 MCG/0.04ML ~~LOC~~ SOPN: 10.0000 ug | PEN_INJECTOR | Freq: Two times a day (BID) | SUBCUTANEOUS | Status: DC

## 2012-05-08 MED ORDER — INSULIN ASPART 100 UNIT/ML ~~LOC~~ SOLN: 60.0000 [IU] | Freq: Three times a day (TID) | SUBCUTANEOUS | Status: DC

## 2012-05-08 NOTE — Patient Instructions (Addendum)
Increase Byetta to 77mg twice daily Finish current supply of 70/30 insulin.  THEN D/C  Once completed 70/30 START Lantus 100units twice daily prior to two meals AND Novolog 60 units prior to mid-day meal and 90 units prior to evening meal.   Follow up in 2-3 weeks in Pharmacy Clinic.   Please bring your blood glucose meter to that visit.   Short-term goal should be another 5 # weight loss.

## 2012-05-08 NOTE — Progress Notes (Signed)
  Subjective:    Patient ID: [name], female    DOB: [birth year], 56 y.o.   MRN: [id number]  HPI Patient with complicated hx arives in good spirits.   She states that her social situation remains unchanged with her Husband remaining to live at home at this time.   He was considering placement. She remains stressed but was able to achieve the 5# weight loss goal we had set at last visit.   She was seen by nutritionist and by wound care of right leg would since my last visit.   She reports adherence with current regimen. Denies any GI symptoms with Byetta.   She is willing to switch insulin tx to improve control .    Review of Systems     Objective:   Physical Exam  Blood glucose readings from meter downloaded: Average 202 However range of 54-366 with 7/40 readings being low      Assessment & Plan:   Diabetes currently under suboptimal  control of blood glucose based on   Lab Results  Component Value Date   HGBA1C 9.5 04/03/2012    ,home fasting CBG readings of > 200 and random CBG readings of 54 -366. Control is suboptimal due to many factors including . Reports hypoglycemic events which are mangaged acceptably.  Able to verbalize appropriate hypoglycemia management plan.  Will finish and then discontinue 70/30 insulin.    Initiated a trial of basal insulin Lantus (insulin glargine) to 100 units twice daily and. Initiated a trial of rapid insulin Novolog (insulin aspart) at a dose of 60 units prior to mid-day meal and 90 units prior to evening meal.  Increased dose of Byetta to 31mg prior to meals BID.   Written patient instructions provided.  Follow up in  Pharmacist Clinic Visit in 2-3 weeks.  Asked to bring meter to visit. [facility]  Total time in face to face counseling 550mutes.

## 2012-05-08 NOTE — Assessment & Plan Note (Signed)
Diabetes currently under suboptimal  control of blood glucose based on   Lab Results  Component Value Date   HGBA1C 9.5 04/03/2012    ,home fasting CBG readings of > 200 and random CBG readings of 54 -366. Control is suboptimal due to many factors including . Reports hypoglycemic events which are mangaged acceptably.  Able to verbalize appropriate hypoglycemia management plan.  Will finish and then discontinue 70/30 insulin.    Initiated a trial of basal insulin Lantus (insulin glargine) to 100 units twice daily and. Initiated a trial of rapid insulin Novolog (insulin aspart) at a dose of 60 units prior to mid-day meal and 90 units prior to evening meal.  Increased dose of Byetta to 61mg prior to meals BID.   Written patient instructions provided.  Follow up in  Pharmacist Clinic Visit in 2-3 weeks.  Asked to bring meter to visit. [facility]  Total time in face to face counseling 563mutes.

## 2012-05-08 NOTE — Progress Notes (Signed)
[facility]  NAME:  [name], [name]                      ACCOUNT NO.:  MEDICAL RECORD NO.:  [id number]      DATE OF BIRTH:  Oct 04, 1955  PHYSICIAN:  Ricard Dillon, M.D. VISIT DATE:  05/07/2012                                  OFFICE VISIT   LOCATION:  [place].  HISTORY:  [name] is a 56 year old woman that we know well from previous stays here with predominantly venous insufficiency ulcers on her right leg.  She is also a diabetic.  We last discharged her on March 26, 2012  with the wounds on the right anterior leg, resolved.  She tells me she did well for until about a week ago.  She noted some drainage on her compression stocking.  The leg progressively developed increasing drainage, pain, swelling and warmth.  There is now 2 open areas on the anterior leg.  She is in here for our review of this.  She is not running a fever.  During her last stay here in July 2013, we did a duplex ultrasound of the right leg which was negative for DVT.  I do not actually see her arterial studies although her dorsalis pedis pulse has been easily palpable in this clinic.  On examination, the area in question is on the right anterior leg. There are 2 open areas both of which are small; however there is drainage.  More worrisome than this, there is some chronic surrounding erythema which I think is mostly chronic stasis dermatitis.  However, there is swelling, warmth, and pain around this area.  The differential in my mind, came down to cellulitis versus deep venous thrombosis.  I think if this is probably cellulitis.  There was no tenderness, swelling and warmth on the back of her leg and venous thrombosis tends to be more confluent in this.  IMPRESSIONS:  Recurrent ulcers on the anterior leg, which are probably mostly venous insufficiency in etiology.  There is no trauma.  I think the whole thing has been complicated by cellulitis.  Previous C and S  of this area grew E. Coli 2 or 3 months ago.  Therefore, I prescribed ciprofloxacin.  I wanted her to be able to watch this.  So, we used Kerlix and an Ace wrap that she can change.  I have advised her that if the swelling progresses up her legs and/or she develops systemic toxicity like fever, she will need to be attended to in the emergency room.  Other than that, we will see her back early next week in follow up.          ______________________________ Ricard Dillon, M.D.     MGR/MEDQ  D:  05/07/2012  T:  05/08/2012  Job:  [id number]

## 2012-05-12 ENCOUNTER — Encounter: Payer: Self-pay | Admitting: Pharmacist

## 2012-05-12 ENCOUNTER — Other Ambulatory Visit: Payer: Self-pay | Admitting: Family Medicine

## 2012-05-12 DIAGNOSIS — Z1231 Encounter for screening mammogram for malignant neoplasm of breast: Secondary | ICD-10-CM

## 2012-05-12 NOTE — Progress Notes (Signed)
Patient ID: [name], female   DOB: 1956-04-13, 56 y.o.   MRN: [id number] Reviewed and agree with Dr. Graylin Shiver documentation and management.

## 2012-05-20 ENCOUNTER — Ambulatory Visit: Payer: Medicaid Other | Admitting: Family Medicine

## 2012-05-28 ENCOUNTER — Encounter (HOSPITAL_BASED_OUTPATIENT_CLINIC_OR_DEPARTMENT_OTHER): Payer: Medicaid Other | Attending: Internal Medicine

## 2012-05-28 ENCOUNTER — Ambulatory Visit (INDEPENDENT_AMBULATORY_CARE_PROVIDER_SITE_OTHER): Payer: Medicaid Other | Admitting: Pharmacist

## 2012-05-28 ENCOUNTER — Encounter: Payer: Self-pay | Admitting: Pharmacist

## 2012-05-28 VITALS — BP 179/81 | HR 59 | Temp 98.5°F | Ht 68.5 in | Wt 335.0 lb

## 2012-05-28 DIAGNOSIS — L97809 Non-pressure chronic ulcer of other part of unspecified lower leg with unspecified severity: Secondary | ICD-10-CM | POA: Insufficient documentation

## 2012-05-28 DIAGNOSIS — G609 Hereditary and idiopathic neuropathy, unspecified: Secondary | ICD-10-CM

## 2012-05-28 DIAGNOSIS — IMO0001 Reserved for inherently not codable concepts without codable children: Secondary | ICD-10-CM

## 2012-05-28 DIAGNOSIS — I872 Venous insufficiency (chronic) (peripheral): Secondary | ICD-10-CM | POA: Insufficient documentation

## 2012-05-28 DIAGNOSIS — I8390 Asymptomatic varicose veins of unspecified lower extremity: Secondary | ICD-10-CM | POA: Insufficient documentation

## 2012-05-28 DIAGNOSIS — E1149 Type 2 diabetes mellitus with other diabetic neurological complication: Secondary | ICD-10-CM

## 2012-05-28 DIAGNOSIS — E1165 Type 2 diabetes mellitus with hyperglycemia: Secondary | ICD-10-CM

## 2012-05-28 DIAGNOSIS — E1169 Type 2 diabetes mellitus with other specified complication: Secondary | ICD-10-CM | POA: Insufficient documentation

## 2012-05-28 MED ORDER — INSULIN PEN NEEDLE 29G X 12.7MM MISC: 1.0000 | Freq: Two times a day (BID) | Status: DC

## 2012-05-28 MED ORDER — GLUCOSE BLOOD VI STRP: ORAL_STRIP | Status: DC

## 2012-05-28 MED ORDER — "INSULIN SYRINGE-NEEDLE U-100 30G X 1/2"" 1 ML MISC": 1.0000 | Freq: Two times a day (BID) | Status: DC

## 2012-05-28 MED ORDER — "INSULIN SYRINGE-NEEDLE U-100 30G X 1/2"" 1 ML MISC": Status: DC

## 2012-05-28 MED ORDER — ACCU-CHEK FASTCLIX LANCETS MISC: 1.0000 | Freq: Three times a day (TID) | Status: DC

## 2012-05-28 NOTE — Patient Instructions (Addendum)
Start taking Lantus 100 units twice a day.  Start taking Novolog: Inject 60 units prior to mid-day meal and 90 units prior to evening meal.  Do not skip meals, especially breakfast.  Schedule an appointment with Dr. Valentina Lucks in early January.

## 2012-05-29 NOTE — Progress Notes (Signed)
Patient ID: [name], female   DOB: 06-29-1955, 56 y.o.   MRN: [id number] Reviewed and agree with Dr. Graylin Shiver documentation and management.

## 2012-05-29 NOTE — Assessment & Plan Note (Signed)
Continues to complain of neuropathic pain most likely related to poor control of diabetes.   Patient is interested in considering a trial of another agent.   Discussed possibility of starting lyrica if CBG improvement does NOT improve pain.   Will defer decision to Dr. Yong Channel for consideration.   She is currently running low on gabapentin supply.

## 2012-05-29 NOTE — Progress Notes (Signed)
  Subjective:    Patient ID: [name], female    DOB: 1956/05/10, 56 y.o.   MRN: [id number]  HPI Patient arrives in fair spirits.  She states that her home stress situation remains unchanged.   She continues to be stressed by her husband's health and his demands on her.   She has increased her Byetta dose to 33mg twice daily by using two shots of the Byetta 558m pens.     She is almost out of the previous supply of the Novoloin 70/30.   She plans to start Byetta 1023mpens 1 injection twice daily prior to meals,  Novolog 60units prior to mid-day meal and 90 units prior to late day meal, AND Lantus 100 units twice daily at the same time as other shots.    She was happy that she lost 1 pound since last visit however she realizes she has NOT met her goal of 5 lbs.   We reestablished that as our goal for the next visit.    Review of Systems     Objective:   Physical Exam  Downloaded CBG readings from [employer]:  11/6-12/5 Average 198 with Range of 53-366       Assessment & Plan:   Diabetes currently under poor control of blood glucose based on  . Lab Results  Component Value Date   HGBA1C 9.5 04/03/2012     ,home fasting CBG readings > 200 routinely and readings of < 80 when she takes her insulin and then is distracted or avoids eating due to stress.  Control is suboptimal due to stress and suboptimal drug regimen. Reports hypoglycemic events with novolin 70/30 and skipping meals..  Able to verbalize appropriate hypoglycemia management plan. Started basal insulin Lantus (insulin glargine) with initial dose of 100units BID.   This dose was selected as the dose of 70/30 was > 300 units daily.  She will also start Novolog with two meals of her usual day.   Written patient instructions provided.  Follow up in  Pharmacist Clinic Visit in early January.   Supply prescriptions for needles AND strips sent to Pharmacy.   Total time in face to face counseling 45 minutes.  Patient seen with  StaNicoletta BaharmD candidate AND NatEverlean Alstromed Student.      .Marland Kitchen

## 2012-05-29 NOTE — Assessment & Plan Note (Signed)
Diabetes currently under poor control of blood glucose based on  . Lab Results  Component Value Date   HGBA1C 9.5 04/03/2012     ,home fasting CBG readings > 200 routinely and readings of < 80 when she takes her insulin and then is distracted or avoids eating due to stress.  Control is suboptimal due to stress and suboptimal drug regimen. Reports hypoglycemic events with novolin 70/30 and skipping meals..  Able to verbalize appropriate hypoglycemia management plan. Started basal insulin Lantus (insulin glargine) with initial dose of 100units BID.   This dose was selected as the dose of 70/30 was > 300 units daily.  She will also start Novolog with two meals of her usual day.   Written patient instructions provided.  Follow up in  Pharmacist Clinic Visit in early January.   Supply prescriptions for needles AND strips sent to Pharmacy.   Total time in face to face counseling 45 minutes.  Patient seen with Nicoletta Ba, PharmD candidate AND Everlean Alstrom, Med Student.

## 2012-05-31 ENCOUNTER — Other Ambulatory Visit: Payer: Self-pay | Admitting: Family Medicine

## 2012-06-02 ENCOUNTER — Ambulatory Visit (HOSPITAL_COMMUNITY)
Admission: RE | Admit: 2012-06-02 | Discharge: 2012-06-02 | Disposition: A | Discharge status: Home / Self Care | Payer: Medicaid Other | Source: Ambulatory Visit | Attending: Family Medicine | Admitting: Family Medicine

## 2012-06-02 DIAGNOSIS — Z1231 Encounter for screening mammogram for malignant neoplasm of breast: Secondary | ICD-10-CM | POA: Insufficient documentation

## 2012-06-04 ENCOUNTER — Encounter (HOSPITAL_BASED_OUTPATIENT_CLINIC_OR_DEPARTMENT_OTHER): Payer: Medicaid Other

## 2012-06-04 ENCOUNTER — Ambulatory Visit: Payer: Medicaid Other | Admitting: Family Medicine

## 2012-06-04 ENCOUNTER — Encounter: Payer: Self-pay | Admitting: Family Medicine

## 2012-06-04 DIAGNOSIS — N182 Chronic kidney disease, stage 2 (mild): Secondary | ICD-10-CM | POA: Insufficient documentation

## 2012-06-09 ENCOUNTER — Other Ambulatory Visit: Payer: Self-pay | Admitting: Family Medicine

## 2012-06-11 ENCOUNTER — Ambulatory Visit: Payer: Medicaid Other | Admitting: *Deleted

## 2012-06-12 ENCOUNTER — Encounter: Payer: Self-pay | Admitting: Pharmacist

## 2012-06-12 ENCOUNTER — Ambulatory Visit (INDEPENDENT_AMBULATORY_CARE_PROVIDER_SITE_OTHER): Payer: Medicaid Other | Admitting: Pharmacist

## 2012-06-12 VITALS — BP 125/79 | HR 65 | Ht 68.5 in | Wt 329.1 lb

## 2012-06-12 DIAGNOSIS — G609 Hereditary and idiopathic neuropathy, unspecified: Secondary | ICD-10-CM

## 2012-06-12 DIAGNOSIS — E1165 Type 2 diabetes mellitus with hyperglycemia: Secondary | ICD-10-CM

## 2012-06-12 DIAGNOSIS — I10 Essential (primary) hypertension: Secondary | ICD-10-CM

## 2012-06-12 MED ORDER — AMLODIPINE BESYLATE 10 MG PO TABS: 10.0000 mg | ORAL_TABLET | Freq: Every day | ORAL | Status: DC

## 2012-06-12 MED ORDER — EXENATIDE 10 MCG/0.04ML ~~LOC~~ SOPN: 10.0000 ug | PEN_INJECTOR | Freq: Two times a day (BID) | SUBCUTANEOUS | Status: DC

## 2012-06-12 MED ORDER — PREGABALIN 75 MG PO CAPS: 75.0000 mg | ORAL_CAPSULE | Freq: Two times a day (BID) | ORAL | Status: DC

## 2012-06-12 MED ORDER — TRIAMTERENE-HCTZ 37.5-25 MG PO TABS: 1.0000 | ORAL_TABLET | Freq: Every day | ORAL | Status: DC

## 2012-06-12 MED ORDER — INSULIN GLARGINE 100 UNIT/ML ~~LOC~~ SOLN: 100.0000 [IU] | Freq: Two times a day (BID) | SUBCUTANEOUS | Status: DC

## 2012-06-12 MED ORDER — INSULIN ASPART 100 UNIT/ML ~~LOC~~ SOLN: 60.0000 [IU] | Freq: Two times a day (BID) | SUBCUTANEOUS | Status: DC

## 2012-06-12 NOTE — Assessment & Plan Note (Signed)
Discontinued Gabapentin and initiated lyrica 69m BID in attempt to minimize daytime sedation and improve chronic pain control.   Encouraged continued weight loss plan

## 2012-06-12 NOTE — Patient Instructions (Addendum)
Take Lantus, Byetta AND Novolog prior to meals twice daily.   Stop the GABApentin  And start Lyrica twice daily.   Enjoy the holidays!   Keep up the great work you are doing on losing weight!!!  See you again early in the year.

## 2012-06-12 NOTE — Progress Notes (Signed)
  Subjective:    Patient ID: [name], female    DOB: 27-Apr-1956, 56 y.o.   MRN: [id number]  HPI Patient arrives for CBG follow up and medication review prior to end of year AND change of insurance coverage.  She states she will have Medicare coverage in March of 2014.    She is interested in several changes to medications including a change from her Gabapentin to lyrica as discussed previously and discussed with Dr. Yong Channel.    She is ready to start Lantus, Novolog and Byetta regimen in the next few days.     Review of Systems     Objective:   Physical Exam        Assessment & Plan:   Diabetes currently under poor control of blood glucose based on   Lab Results  Component Value Date   HGBA1C 9.5 04/03/2012   wide variety of random CBG readings due to continued use of 70/30.   She plans to switch over to Lantus/Novolog and Byetta regimen in the next few days.  Control is suboptimal due to stressful lifestyle, poor diet and exercise regimen combined with suboptimal insulin regimen.  Blood glucose reconrds show low readings in the late afternoon most likely due to 70/30.   Goal would be to avoid these  hypoglycemic events.  Able to verbalize appropriate hypoglycemia management plan. Continued plan to start basal insulin Lantus (insulin glargine) at 100 units BID dose. Continued plan to start rapid insulin Novolog (insulin aspart) 60-90 units prior to each of TWO meals.  Written patient instructions provided.  Refilled multiple medications with 90 day supplies to help cover her supply of medication issue until March and her period of new insurance coverage.  Follow up with Dr. Yong Channel next week and then  Pharmacist Clinic Visit in late January.   Total time in face to face counseling 45 minutes.  Patient seen with Nicoletta Ba, PharmD Candidate  Discontinued Gabapentin and initiated lyrica 71m BID in attempt to minimize daytime sedation and improve chronic pain control.   Encouraged  continued weight loss plan.

## 2012-06-12 NOTE — Assessment & Plan Note (Signed)
Diabetes currently under poor control of blood glucose based on   Lab Results  Component Value Date   HGBA1C 9.5 04/03/2012   wide variety of random CBG readings due to continued use of 70/30.   She plans to switch over to Lantus/Novolog and Byetta regimen in the next few days.  Control is suboptimal due to stressful lifestyle, poor diet and exercise regimen combined with suboptimal insulin regimen.  Blood glucose reconrds show low readings in the late afternoon most likely due to 70/30.   Goal would be to avoid these  hypoglycemic events.  Able to verbalize appropriate hypoglycemia management plan. Continued plan to start basal insulin Lantus (insulin glargine) at 100 units BID dose. Continued plan to start rapid insulin Novolog (insulin aspart) 60-90 units prior to each of TWO meals.  Written patient instructions provided.  Refilled multiple medications with 90 day supplies to help cover her supply of medication issue until March and her period of new insurance coverage.  Follow up with Dr. Yong Channel next week and then  Pharmacist Clinic Visit in late January.   Total time in face to face counseling 45 minutes.  Patient seen with Nicoletta Ba, PharmD Candidate  Discontinued Gabapentin and initiated lyrica 19m BID in attempt to minimize daytime sedation and improve chronic pain control.   Encouraged continued weight loss plan

## 2012-06-15 NOTE — Progress Notes (Signed)
Patient ID: [name], female   DOB: [birth year], 56 y.o.   MRN: [id number] Reviewed: Agree with Dr. Graylin Shiver management and documentation.

## 2012-06-18 ENCOUNTER — Ambulatory Visit: Payer: Medicaid Other

## 2012-06-19 ENCOUNTER — Encounter: Payer: Self-pay | Admitting: Family Medicine

## 2012-06-19 ENCOUNTER — Ambulatory Visit (INDEPENDENT_AMBULATORY_CARE_PROVIDER_SITE_OTHER): Payer: Medicaid Other | Admitting: Family Medicine

## 2012-06-19 VITALS — BP 150/69 | HR 73 | Temp 98.1°F | Ht 68.5 in | Wt 323.0 lb

## 2012-06-19 DIAGNOSIS — I10 Essential (primary) hypertension: Secondary | ICD-10-CM

## 2012-06-19 DIAGNOSIS — E1165 Type 2 diabetes mellitus with hyperglycemia: Secondary | ICD-10-CM

## 2012-06-19 DIAGNOSIS — G609 Hereditary and idiopathic neuropathy, unspecified: Secondary | ICD-10-CM

## 2012-06-19 DIAGNOSIS — D509 Iron deficiency anemia, unspecified: Secondary | ICD-10-CM | POA: Insufficient documentation

## 2012-06-19 DIAGNOSIS — E785 Hyperlipidemia, unspecified: Secondary | ICD-10-CM

## 2012-06-19 DIAGNOSIS — R634 Abnormal weight loss: Secondary | ICD-10-CM

## 2012-06-19 DIAGNOSIS — D649 Anemia, unspecified: Secondary | ICD-10-CM

## 2012-06-19 HISTORY — DX: Iron deficiency anemia, unspecified: D50.9

## 2012-06-19 LAB — LIPID PANEL
Total CHOL/HDL Ratio: 4.5 Ratio
VLDL: 61 mg/dL — ABNORMAL HIGH (ref 0–40)

## 2012-06-19 LAB — HEPATIC FUNCTION PANEL
Alkaline Phosphatase: 212 U/L — ABNORMAL HIGH (ref 39–117)
Indirect Bilirubin: 0.4 mg/dL (ref 0.0–0.9)
Total Bilirubin: 0.5 mg/dL (ref 0.3–1.2)

## 2012-06-19 LAB — CBC
Platelets: 329 10*3/uL (ref 150–400)
RBC: 5.38 MIL/uL — ABNORMAL HIGH (ref 3.87–5.11)
WBC: 9.1 10*3/uL (ref 4.0–10.5)

## 2012-06-19 LAB — TSH: TSH: 2.9 u[IU]/mL (ref 0.350–4.500)

## 2012-06-19 MED ORDER — TRIAMTERENE-HCTZ 37.5-25 MG PO TABS: 1.0000 | ORAL_TABLET | Freq: Every day | ORAL | Status: DC

## 2012-06-19 NOTE — Assessment & Plan Note (Signed)
Poorly controlled due to noncompliance/misunderstanding as she only got 9 pills of Maxzide and she wasn't sure if she should take it regularly. F/u with BP at next appointment and expect improved control when taking full regimen.

## 2012-06-19 NOTE — Progress Notes (Signed)
Subjective:   1. DIABETES Type II Medications taking and tolerating-yes but typically only noon dose of Lantus 100 units, Byetta, Novolog 100 units. States CBGs as low as 120 at night so she doesn't take any insulin at that time then awakens in AM with sugars over 300. Only takes evening dose if Blood sugar over 200.   Blood Sugars per patient-fasting-typically 250-350. Before dinner 120 low-200 high.  Diet-went to DM education, only occasionally eating sweets, has lost 18 lbs over last few months. Overall feels like eating better.  Regular Exercise- no exercise at this time. Planning on silver sneakers once gets medicare in march.   Last eye exam-needs to wait until medicare.  Last foot exam-unsure, will perform next visit (planned on today but has unnaboot) Last microalbumin/on ace inhibitor-on ace Daily foot monitoring-yes  ROS- (+)Polyuria/Polydipsia/nocturia, (decreased over years, no recent changes) Vision changes, (yes on gabapentin) feet or hand numbness/pain/tingling. Hypoglycemia symptoms (shaky, sweaty, hungry,  anxious, tremor, palpitations, confusion, behavior change)-none since change in meds.   Diabetic Labs:  Lab Results  Component Value Date   HGBA1C 9.5 04/03/2012   HGBA1C 9.1* 01/07/2012   HGBA1C 9.3 10/08/2011   2. Diabetic Neuropathy-never received Lyrica. She complains of continued pain and daytime somnolence from gabapentin. She also doesn't like the dosing of medicine. She had planned to switch to Lyrica but they did not have it at pharmacy when she left. States she has now run out of medicaid and won't have medicare until march. Has gabapentin and will continue to take at this time. Also discussed possible worsened swelling (patient already with venous insufficiency and on amlodipine) and patient once again would like to take more time to consider change.   3. Hypertension- BP Readings from Last 3 Encounters:  06/19/12 150/69  06/12/12 125/79  05/28/12 179/81    Home BP monitoring-no Compliant with medications-yes without side effects with exception of triamterene/hctz which she didn't know if she was supposed to take.  Denies any CP, HA,  worsening blurry vision, worsening LE edema, transient weakness, orthopnea, PND.    ROS--See HPI \  Past Medical History: . Diabetes mellitus out of control  . HYPERCHOLESTEROLEMIA  . DEPRESSION, CHRONIC  . OBSTRUCTIVE SLEEP APNEA  . PERIPHERAL NEUROPATHY  . HYPERTENSION  . UNSPECIFIED VENOUS INSUFFICIENCY  . Diabetic ulcer of lower extremity  . Osteoarthritis of knee  . Low back pain  . CKD (chronic kidney disease), stage II    Reviewed problem list.  Medications- reviewed and updated Chief complaint-noted  Objective: BP 150/69  Pulse 73  Temp 98.1 F (36.7 C) (Oral)  Ht 5' 8.5" (1.74 m)  Wt 323 lb (146.512 kg)  BMI 48.40 kg/m2 Gen: NAD CV: RRR no mrg Lungs: CTAB Skin: right leg wrapped in unna boot, 1+ edema on left leg with associated venous stasis changes.   Assessment/Plan: See problem oriented charted  *Please note that over 50% of 50 minute office visit was spent on counseling as patient is not taking insulin as prescribed, still struggles with dietary indiscretions and has a # of concerns about potential medication changes. . Instructed patient to follow instructions given by Dr. Valentina Lucks and to only not take insulin if sugar is running low after doses and to report to me immediately by phone or mychart.   *Labwork today including CBC for history of anemia in hospital, LFTs for longterm drug use,  Fasting lipids for hyperlipidemia with only LDL since 2011, B12 for neuropathy and history of low B12,  TSH for weight loss.

## 2012-06-19 NOTE — Assessment & Plan Note (Signed)
Moderate control. Patient never got Lyrica and is losing insurance. She would like to continue gabapentin at present-her concerns are that Lyrica may cause some sleepiness as well and cost. She will continue to consider.

## 2012-06-19 NOTE — Patient Instructions (Addendum)
Great to see you today. Congrats again on the 18 lbs weight loss. I am thrilled to hear about your hard work.  I want you to take the insulin as prescribed before each meal. Don't take the evening dose before dinner only if you are not going to eat or ir your blood sugar is very low. If you are getting lows when you take all the medicine, I want you to call us so we can adjust.   We are going to consider the Lyrica once your medicare goes through. See me in 2 months and Dr. Valentina Lucks in 1 month. We will do a   Health Maintenance Due  Topic Date Due  . Foot Exam  05/29/2011  . Pap Smear  01/13/2012

## 2012-06-19 NOTE — Assessment & Plan Note (Addendum)
Poorly controlled due to compliance issues with prescribed regimen. Did praise patient for 18 lbs weight loss through counseling with Dr. Valentina Lucks. Reinforced that patient should take Lantus twice a day for baseline control of blood sugars and need for novolog, byetta at meals. Encouraged patient to check CBGs regularly.  Follow up 1 month with pharmacy clinic and 2 months with me. Would follow up sooner but patient needs to minimize visits due to losing insurance until march.   Deferred eye exam until she has insurance as well and diabetic foot exam until out of unnaboot.

## 2012-06-22 ENCOUNTER — Encounter: Payer: Self-pay | Admitting: *Deleted

## 2012-06-22 ENCOUNTER — Encounter: Payer: Medicaid Other | Attending: Family Medicine | Admitting: *Deleted

## 2012-06-22 VITALS — Ht 69.0 in | Wt 323.7 lb

## 2012-06-22 DIAGNOSIS — Z713 Dietary counseling and surveillance: Secondary | ICD-10-CM | POA: Insufficient documentation

## 2012-06-22 DIAGNOSIS — E1165 Type 2 diabetes mellitus with hyperglycemia: Secondary | ICD-10-CM

## 2012-06-22 DIAGNOSIS — IMO0001 Reserved for inherently not codable concepts without codable children: Secondary | ICD-10-CM | POA: Insufficient documentation

## 2012-06-22 NOTE — Progress Notes (Signed)
  Medical Nutrition Therapy:  Appt start time: 1230 end time:  1300  Assessment:  Primary concerns today: patient returns for diabetes education. Weight loss of almost 20 pounds noted! She is drinking almost 32 oz water every day. No potatoes, candy or chips since last visit, eating all vegetables and meat now. Son is home from [employer] school and she states he has been very supportive. Still struggles with disabled husband.   MEDICATIONS: see list. Diabetes medications include Byetta BID, insulin changed from Novolin 70/30 235 units in AM and 155 units in PM to Lantus @ 100 units BID and Novolog ordered as 60 units at mid day meal and 90 at supper meal. HOWEVER, she has been taking it 100 at mid day and 60 at supper, so she is hoping that by correcting these doses that will bring down her FBG.  Metformin 1000 mg BID.   DIETARY INTAKE:  Usual eating pattern includes 2-3 meals and stated no snacks per day.  Everyday foods include simple foods including starches and often high fat meats.  Avoided foods include regular soda, and she states she is staying away from chips,.    24-hr recall:  B ( 1-2 PM): forgets to eat often, cereal with fruit, skim OR   Snk ( AM): none  L (8 PM): soup with crackers OR casserole, diet soda or Crystal Light Snk ( PM): chews ice OR occasionally chocolate candy  D (12 AM): PNB or meat sandwich Snk ( PM): none stated Beverages: coffee with cream and Sugar Free [place] Vanilla, Crystal Light, water,   Usual physical activity: cares for invalid husband, would enjoy pool exercises when leg is not oozing,   Estimated energy needs: 1500 calories 170 g carbohydrates 112 g protein 42 g fat  Progress Towards Goal(s):  In progress.   Nutritional Diagnosis:  NB-1.1 Food and nutrition-related knowledge deficit As related to diabetes management.  As evidenced by A1c of 9.5%.    Intervention:  Nutrition counseling and diabetes education continued. Commended her on her  continued weight loss. Encouraged her to continue with 3 meals a day as she is able and with increased water intake. Reviewed the action of her 2 types of insulin and encouraged her to take the Lantus at consistent times.  Reviewed carb counting again and the rationale of SMBG and how to trouble shoot those results.  Goals:  Continue to aim for 3 Carb Choices (45 grams) per meal +/- 1 either way  Continue to aim for 1-2 Carbs per snack if hungry  Consider reading food labels for total carb of foods in your home  Continue checking your BG daily, consider checking at alternate times of day  Continue drinking 32 oz water each day   Start taking Lantus at more consistent times of day  Start taking Novolog as prescribed by MD with 60 units at mid day meal and 90 units at evening meal.  Contact MD if you start having low blood glucose as your weight continues to decrease so they can adjust your insulin doses as needed. Handouts given during visit include: Carb Counting and Food Label handouts Grocery List Menu Planner  Monitoring/Evaluation:  Dietary intake, exercise, reading food labels, and body weight in 12 weeks. She will not have insurance coverage until March when she will be going on Medicare.

## 2012-06-22 NOTE — Patient Instructions (Addendum)
Goals:  Continue to aim for 3 Carb Choices (45 grams) per meal +/- 1 either way  Continue to aim for 1-2 Carbs per snack if hungry  Consider reading food labels for total carb of foods in your home  Continue checking your BG daily, consider checking at alternate times of day  Continue drinking 32 oz water each day  Start taking Lantus at more consistent times of day  Start taking Novolog as prescribed by MD with 60 units at mid day meal and 90 units at evening meal.  Contact MD if you start having low blood glucose as your weight continues to decrease so they can adjust your insulin doses as needed.

## 2012-06-25 ENCOUNTER — Encounter (HOSPITAL_BASED_OUTPATIENT_CLINIC_OR_DEPARTMENT_OTHER): Payer: Medicaid Other | Attending: Internal Medicine

## 2012-06-25 DIAGNOSIS — I8390 Asymptomatic varicose veins of unspecified lower extremity: Secondary | ICD-10-CM | POA: Insufficient documentation

## 2012-06-25 DIAGNOSIS — L97809 Non-pressure chronic ulcer of other part of unspecified lower leg with unspecified severity: Secondary | ICD-10-CM | POA: Insufficient documentation

## 2012-06-25 DIAGNOSIS — E1169 Type 2 diabetes mellitus with other specified complication: Secondary | ICD-10-CM | POA: Insufficient documentation

## 2012-06-25 DIAGNOSIS — I872 Venous insufficiency (chronic) (peripheral): Secondary | ICD-10-CM | POA: Insufficient documentation

## 2012-07-09 LAB — GLUCOSE, CAPILLARY

## 2012-07-21 ENCOUNTER — Ambulatory Visit: Payer: Medicaid Other | Admitting: Pharmacist

## 2012-07-30 ENCOUNTER — Encounter (HOSPITAL_BASED_OUTPATIENT_CLINIC_OR_DEPARTMENT_OTHER): Payer: Medicaid Other

## 2012-07-31 ENCOUNTER — Telehealth: Payer: Self-pay | Admitting: Family Medicine

## 2012-07-31 NOTE — Telephone Encounter (Signed)
I spoke with patient after the first message and advised that we have 2 samples of Lantus pens I can give her.  This is only going to last for 3 days however. She stated she would come and pick up. States medicare will go into effect in March .

## 2012-07-31 NOTE — Telephone Encounter (Signed)
Pt is now out of her Lantus today and she has no money or insurance and wants to know if we have any samples until march when she gets her insurance back.  pls advise

## 2012-07-31 NOTE — Telephone Encounter (Signed)
Pt is asking to change her meds to something different instead of Lantus since she cannot afford this.

## 2012-08-02 NOTE — Telephone Encounter (Signed)
Spoke with patient. Has enough lantus and novolog to get her through until the end of the week. Will have medicaid reportedly in March so we will need to tide her over until then when we can transition back to lantus/novolog.   We are going to transition back to a 70/30 mix but as patient has no insurance will have to transition to [place].   Patient is going to sign up for patient portal today and send me a message early this week and I will send in her prescription.   I am going to request Dr. Graylin Shiver input for her regimen as he is helping me manage her diabetes. She previously was on 230 units novolog 70/30 in AM and 155 in PM but has intermittent hypoglycemia between meals with this regimen but also a poorly controlled a1c. Given we will be using regular insulin my thought would be to use lower doses such as 200 in AM and 100 in PM (currently on 350 units of lantus/novolog combined).

## 2012-08-20 ENCOUNTER — Encounter (HOSPITAL_BASED_OUTPATIENT_CLINIC_OR_DEPARTMENT_OTHER): Payer: Self-pay | Attending: Internal Medicine

## 2012-08-20 DIAGNOSIS — L97809 Non-pressure chronic ulcer of other part of unspecified lower leg with unspecified severity: Secondary | ICD-10-CM | POA: Insufficient documentation

## 2012-08-20 DIAGNOSIS — L02419 Cutaneous abscess of limb, unspecified: Secondary | ICD-10-CM | POA: Insufficient documentation

## 2012-08-20 DIAGNOSIS — I872 Venous insufficiency (chronic) (peripheral): Secondary | ICD-10-CM | POA: Insufficient documentation

## 2012-08-20 DIAGNOSIS — E1169 Type 2 diabetes mellitus with other specified complication: Secondary | ICD-10-CM | POA: Insufficient documentation

## 2012-08-24 ENCOUNTER — Encounter (HOSPITAL_BASED_OUTPATIENT_CLINIC_OR_DEPARTMENT_OTHER): Payer: Medicare HMO | Attending: Internal Medicine

## 2012-08-24 DIAGNOSIS — L97809 Non-pressure chronic ulcer of other part of unspecified lower leg with unspecified severity: Secondary | ICD-10-CM | POA: Insufficient documentation

## 2012-08-24 DIAGNOSIS — I872 Venous insufficiency (chronic) (peripheral): Secondary | ICD-10-CM | POA: Insufficient documentation

## 2012-08-24 DIAGNOSIS — E1169 Type 2 diabetes mellitus with other specified complication: Secondary | ICD-10-CM | POA: Insufficient documentation

## 2012-08-24 DIAGNOSIS — L539 Erythematous condition, unspecified: Secondary | ICD-10-CM | POA: Insufficient documentation

## 2012-08-27 ENCOUNTER — Encounter: Payer: Self-pay | Admitting: Pharmacist

## 2012-08-27 ENCOUNTER — Ambulatory Visit (HOSPITAL_COMMUNITY)
Admission: RE | Admit: 2012-08-27 | Discharge: 2012-08-27 | Disposition: A | Discharge status: Home / Self Care | Payer: Medicare HMO | Source: Ambulatory Visit | Attending: Family Medicine | Admitting: Family Medicine

## 2012-08-27 ENCOUNTER — Ambulatory Visit (INDEPENDENT_AMBULATORY_CARE_PROVIDER_SITE_OTHER): Payer: Medicare HMO | Admitting: Pharmacist

## 2012-08-27 ENCOUNTER — Ambulatory Visit (INDEPENDENT_AMBULATORY_CARE_PROVIDER_SITE_OTHER): Payer: Medicare HMO | Admitting: Family Medicine

## 2012-08-27 ENCOUNTER — Other Ambulatory Visit: Payer: Self-pay | Admitting: Family Medicine

## 2012-08-27 VITALS — BP 138/70 | HR 63 | Resp 18 | Ht 69.0 in | Wt 326.7 lb

## 2012-08-27 VITALS — BP 138/70 | HR 63 | Ht 69.0 in | Wt 326.7 lb

## 2012-08-27 DIAGNOSIS — I10 Essential (primary) hypertension: Secondary | ICD-10-CM

## 2012-08-27 DIAGNOSIS — IMO0002 Reserved for concepts with insufficient information to code with codable children: Secondary | ICD-10-CM | POA: Insufficient documentation

## 2012-08-27 DIAGNOSIS — G8929 Other chronic pain: Secondary | ICD-10-CM | POA: Insufficient documentation

## 2012-08-27 MED ORDER — TRIAMTERENE-HCTZ 37.5-25 MG PO TABS: 1.0000 | ORAL_TABLET | Freq: Every day | ORAL | Status: DC

## 2012-08-27 MED ORDER — INSULIN GLARGINE 100 UNIT/ML ~~LOC~~ SOLN: 100.0000 [IU] | Freq: Two times a day (BID) | SUBCUTANEOUS | Status: DC

## 2012-08-27 MED ORDER — METFORMIN HCL 1000 MG PO TABS: 1000.0000 mg | ORAL_TABLET | Freq: Two times a day (BID) | ORAL | Status: DC

## 2012-08-27 MED ORDER — INSULIN ASPART 100 UNIT/ML ~~LOC~~ SOLN: 60.0000 [IU] | Freq: Two times a day (BID) | SUBCUTANEOUS | Status: DC

## 2012-08-27 MED ORDER — PREGABALIN 50 MG PO CAPS: 50.0000 mg | ORAL_CAPSULE | Freq: Two times a day (BID) | ORAL | Status: DC

## 2012-08-27 NOTE — Patient Instructions (Addendum)
Stop 70/30 insulin  Restart Lantus 100units twice daily  Restart Novolog 60units prior to lunch and 90 units prior to later day meal.  Continue Byetta 15mg twice daily Restart Metformin 10070mtwice daily.   Next visit with Dr. KoValentina Lucksn 4-5 weeks.

## 2012-08-27 NOTE — Progress Notes (Signed)
  Subjective:    Patient ID: [name], female    DOB: 1955-12-13, 57 y.o.   MRN: [id number]  HPI  Patient arrives in good spirits.   Continues to have stress related to her husband and his progressive MS.   She has recently been seen by wound management and is taking Bactrim and Cipro for right lower leg cellulitis.  She has ~ 5 more days of tx.     Her insurance coverage has change as she NOW has Medicare.   She has been taking 70/30 insulin due to cost for the past few weeks.   She desires to switch back to Lantus and Novolog.   Her control is reported to be fair, denies hypoglycemia.      Review of Systems     Objective:   Physical Exam  Meter     Download report showed avg CBG ~248.       Assessment & Plan:   Diabetes of many yrs duration currently under fair control of blood glucose based on   Lab Results  Component Value Date   HGBA1C 9.5 04/03/2012    ,home CBG readings < 300 and denies any recent readings < 100. Control is suboptimal due to stress, high level of insulin resistance, current cellulitis of Right leg and suboptimal dosing of insulin and other DM meds.  Restarted basal insulin Lantus (insulin glargine) at 100units BID.  Restarted rapid insulin Novolog (insulin aspart) to 60 units prior to lunch and 90 units prior to evening meal.  Written patient instructions provided.  Deferred chronic neuropathic pain and cellulitis follow up with Dr. Yong Channel today.  Follow up in  Pharmacist Clinic Visit in 4-5 weeks.   Total time in face to face counseling 50 minutes.  Patient seen with Luna Fuse, PharmD candidate.

## 2012-08-27 NOTE — Assessment & Plan Note (Signed)
Diabetes of many yrs duration currently under fair control of blood glucose based on   Lab Results  Component Value Date   HGBA1C 9.5 04/03/2012    ,home CBG readings < 300 and denies any recent readings < 100. Control is suboptimal due to stress, high level of insulin resistance, current cellulitis of Right leg and suboptimal dosing of insulin and other DM meds.  Restarted basal insulin Lantus (insulin glargine) at 100units BID.  Restarted rapid insulin Novolog (insulin aspart) to 60 units prior to lunch and 90 units prior to evening meal.  Written patient instructions provided.  Deferred chronic neuropathic pain and cellulitis follow up with Dr. Yong Channel today.  Follow up in  Pharmacist Clinic Visit in 4-5 weeks.   Total time in face to face counseling 50 minutes.  Patient seen with Luna Fuse, PharmD candidate.

## 2012-08-27 NOTE — Patient Instructions (Addendum)
1. For your knees, let's start out with x-rays. Depending on what they look like we may send you back to Dr. Marlou Sa. Another option is to do steroid injections (depends on severity).  2. For your diabetic neuropathy, let's give Lyrica a try considering you have burning pain even on the gabapentin. Remember the side effects we talked about. You can switch back if need be.  3. I am going to refer you to our patient centered medical home team for help with your diabetes and the fact that you are high risk for falls.   Thanks, Dr. Yong Channel  Please see me at minimum every 3 months. We need to catch up on a few things though so please schedule my next available appointment.   Health Maintenance Due  Topic Date Due  . Foot Exam  05/29/2011  . Pap Smear  01/13/2012  . Hemoglobin A1c  10/02/2012  . Influenza Vaccine  02/22/2013  . Ophthalmology Exam  05/18/2013  . Mammogram  06/03/2014  . Pneumococcal Polysaccharide Vaccine (#2) 07/13/2015  . Colonoscopy  02/21/2016  . Tetanus/tdap  09/16/2021

## 2012-08-28 NOTE — Progress Notes (Signed)
Subjective:   1. Knee pain/high risk of falls-bilateral knee pain for 10 years L slightly worse than R. 4/10 constantly if up on her feet, slight relief with resting. Walks with cane. Stops multiple times on [employer] due to pain. Previously we had discussed referring to previous orthopedist Dr. Marlou Sa who she says did arthroscopic surgery on her knees many years ago. Was told needed knee replacements by the age of 18. Patient has worked on weight loss and is down 15 lbs from peak weight of 341 but with minimal relief. She has had 4 falls in last 6 months (2 on the ice) but states at least one of these falls was due to severity of knee pain.  Patient does have numbness and tingling in her feet related to neuropathy and difficult to tell if any radiation of pain.   2. Diabetic Neuropathy-Patient on gabapentin 376m TID previously due to cost concerns. Has heard some good things about Lyrica and would like to try it. She still has moderate burning pain in her feet even with gabapentin. No fevers/chills. Does have a cellulitis of right leg currently on cipro and bactrim and cared for at wound care center and leg is currently wrapped. Her right big toe also had some slight pus 3 weeks ago which has resolved with the antibiotics (still has a few days). Slight redness around the nailbed continues to improve as well.   ROS--See HPI  Past Medical History-DM, depression, HLD Reviewed problem list.  Medications- reviewed and updated Chief complaint-noted  Objective: BP 138/70  Pulse 63  Resp 18  Ht 5' 9"  (1.753 m)  Wt 326 lb 11.2 oz (148.19 kg)  BMI 48.22 kg/m2 Gen: NAD CV: RRR no mrg Lungs: CTAB Knees-full range of motion. Severe crepitus noted. NO joint effusions. Skin normal around knee.  Diabetic foot exam-patient without sensation on dorsum of foot. She does have some sensation around her ankle but it is decreased bilaterally compared to area around her knee. Right 1st toenail with mild erythema.  Appears ingrown. No pus noted or able to be expressed.   Assessment/Plan:  Ingrown toenail-Patient may have had an infection related to her ingrown toenail. Encouraged her to lift toenail up daily and use warm soaks with soapy water TID. Continue antibiotics for cellulitis. If doesn't continue to improve, may need to remove toenail but given history of poor wound healing I am tentative to be aggressive with this.   Additionally discussed with patient due to multiple medical problems will need frequent visits in order to address multiple issues such as back pain, knee pain, DM, risk of falls, diabetic neuropathy with at minimum visits likely every 2 months.

## 2012-08-28 NOTE — Progress Notes (Signed)
Patient ID: [name], female   DOB: 1955-12-01, 57 y.o.   MRN: [id number] Reviewed:  Agree with Dr. Graylin Shiver documentation and management.

## 2012-08-28 NOTE — Assessment & Plan Note (Addendum)
Moderate to severe tricompartmental disease bilaterally on x-rays (unfortunately did not order as standing as intended) consistent with exam and history. Will trial injection and give 2 months. If relief not as desired, plan referral to Dr. Marlou Sa of orthopedics for consideration of knee replacement. Weight loss would certainly help patient.

## 2012-08-28 NOTE — Assessment & Plan Note (Signed)
Well controlled off amlodipine and given history of peripheral edema, will hold amlodipine at this time. May restart in future if needed.

## 2012-08-28 NOTE — Assessment & Plan Note (Signed)
Will refer to geriatrics clinic for further assessment. Hopeful that better management of knee pain will reduce risk of falls but this certainly will not reduce severe neuropathy that she has due to DM.

## 2012-08-28 NOTE — Assessment & Plan Note (Signed)
Moderate control but patient would like to see if we can improve management. Will change to Lyrica 50 mg BID and titrate up to 127m BID next visit with goal 1537mBID.

## 2012-08-31 ENCOUNTER — Telehealth: Payer: Self-pay | Admitting: *Deleted

## 2012-08-31 NOTE — Telephone Encounter (Signed)
Message copied by Bobbye Riggs on Mon Aug 31, 2012  2:22 PM ------      Message from: Marin Olp      Created: Fri Aug 28, 2012 12:36 PM       LVM for patient to return call: I am referring her to geriatrics clinic for her fall risk. I am planning to do an injection of both knees at a future visit (will need visit dedicated to this) due to moderate-severe arthritis. If not satisfactory results in 2-3 months, will refer to Dr. Marlou Sa.  ------

## 2012-08-31 NOTE — Telephone Encounter (Signed)
If pt returns call, please schedule her an appt with geri clinic and appt with Annie Main.  Please make sure she knows that the geri clinic one is discuss her falling and that the knee injection appointment will ONLY be to get the injections, other issues will have to be discussed at later appointment. [place], [name]

## 2012-08-31 NOTE — Progress Notes (Signed)
So since [name] is 40 should wouldn't be in cohort for falls. Must be 79 and older.  Was your visit with her last week to review her DM?  If so, I need to schedule a visit with her, if you did not discuss DM with her then I don't need to see her.

## 2012-09-01 NOTE — Progress Notes (Signed)
It looks as if she did her PCMH requirements at her last visit and she under 58 years of age for fall risk cohort.  However, Dr Walker Kehr wouldn't mind seeing her to assess her function etc.

## 2012-09-08 ENCOUNTER — Ambulatory Visit (INDEPENDENT_AMBULATORY_CARE_PROVIDER_SITE_OTHER): Payer: Medicare HMO | Admitting: Family Medicine

## 2012-09-08 ENCOUNTER — Encounter: Payer: Self-pay | Admitting: Family Medicine

## 2012-09-08 VITALS — BP 140/72 | HR 71 | Temp 97.6°F | Ht 69.0 in | Wt 319.0 lb

## 2012-09-08 DIAGNOSIS — M1710 Unilateral primary osteoarthritis, unspecified knee: Secondary | ICD-10-CM

## 2012-09-08 DIAGNOSIS — M25569 Pain in unspecified knee: Secondary | ICD-10-CM

## 2012-09-08 MED ORDER — METHYLPREDNISOLONE ACETATE 80 MG/ML IJ SUSP
160.0000 mg | Freq: Once | INTRAMUSCULAR | Status: AC
  Administered 2012-09-08: 160 mg via INTRA_ARTICULAR

## 2012-09-08 MED ORDER — PREGABALIN 50 MG PO CAPS: 50.0000 mg | ORAL_CAPSULE | Freq: Two times a day (BID) | ORAL | Status: DC

## 2012-09-08 NOTE — Patient Instructions (Signed)
Try the Lyrica.  For your blood pressure, you may need to restart amlodipine. See me within the month to follow up on diabetes and back pain.   Knee Injection Joint injections are shots. Your caregiver will place a needle into your knee joint. The needle is used to put medicine into the joint. These shots can be used to help treat different painful knee conditions such as osteoarthritis, bursitis, local flare-ups of rheumatoid arthritis, and pseudogout. Anti-inflammatory medicines such as corticosteroids and anesthetics are the most common medicines used for joint and soft tissue injections.  PROCEDURE  The skin over the kneecap will be cleaned with an antiseptic solution.  Your caregiver will inject a small amount of a local anesthetic (a medicine like Novocaine) just under the skin in the area that was cleaned.  After the area becomes numb, a second injection is done. This second injection usually includes an anesthetic and an anti-inflammatory medicine called a steroid or cortisone. The needle is carefully placed in between the kneecap and the knee, and the medicine is injected into the joint space.  After the injection is done, the needle is removed. Your caregiver may place a bandage over the injection site. The whole procedure takes no more than a couple of minutes. BEFORE THE PROCEDURE  Wash all of the skin around the entire knee area. Try to remove any loose, scaling skin. There is no other specific preparation necessary unless advised otherwise by your caregiver. LET YOUR CAREGIVER KNOW ABOUT:   Allergies.  Medications taken including herbs, eye drops, over the counter medications, and creams.  Use of steroids (by mouth or creams).  Possible pregnancy, if applicable.  Previous problems with anesthetics or Novocaine.  History of blood clots (thrombophlebitis).  History of bleeding or blood problems.  Previous surgery.  Other health problems. RISKS AND COMPLICATIONS Side  effects from cortisone shots are rare. They include:   Slight bruising of the skin.  Shrinkage of the normal fatty tissue under the skin where the shot was given.  Increase in pain after the shot.  Infection.  Weakening of tendons or tendon rupture.  Allergic reaction to the medicine.  Diabetics may have a temporary increase in their blood sugar after a shot.  Cortisone can temporarily weaken the immune system. While receiving these shots, you should not get certain vaccines. Also, avoid contact with anyone who has chickenpox or measles. Especially if you have never had these diseases or have not been previously immunized. Your immune system may not be strong enough to fight off the infection while the cortisone is in your system. AFTER THE PROCEDURE   You can go home after the procedure.  You may need to put ice on the joint 15 to 20 minutes every 3 or 4 hours until the pain goes away.  You may need to put an elastic bandage on the joint. HOME CARE INSTRUCTIONS   Only take over-the-counter or prescription medicines for pain, discomfort, or fever as directed by your caregiver.  You should avoid stressing the joint. Unless advised otherwise, avoid activities that put a lot of pressure on a knee joint, such as:  Jogging.  Bicycling.  Recreational climbing.  Hiking.  Laying down and elevating the leg/knee above the level of your heart can help to minimize swelling. SEEK MEDICAL CARE IF:   You have repeated or worsening swelling.  There is drainage from the puncture area.  You develop red streaking that extends above or below the site where the needle  was inserted. SEEK IMMEDIATE MEDICAL CARE IF:   You develop a fever.  You have pain that gets worse even though you are taking pain medicine.  The area is red and warm, and you have trouble moving the joint. MAKE SURE YOU:   Understand these instructions.  Will watch your condition.  Will get help right away if you  are not doing well or get worse. Document Released: 09/01/2006 Document Revised: 09/02/2011 Document Reviewed: 05/29/2007 [facility] Patient Information 2013 [place].

## 2012-09-09 ENCOUNTER — Encounter: Payer: Self-pay | Admitting: *Deleted

## 2012-09-09 NOTE — Progress Notes (Signed)
Subjective:   1.  Hypertension- BP Readings from Last 3 Encounters:  09/08/12 140/72  08/27/12 138/70  08/28/12 138/70   Home BP monitoring-no Compliant with medications-yes without side effects, stopped amlodipine last visit Denies any CP, HA, SOB, blurry vision, LE edema, transient weakness other than that due to pain, orthopnea, PND.   2. OA knees-discussed x-ray results. Counseled patient on referral at this time vs injection today. Pain 4/10. Patient would liek to trial bilateral injections and refer if not good relief for 2-3 months. Counseled patient on risks of some wt gain and higher CBGs.   ROS--See HPI  Past Medical History Patient Active Problem List  Diagnosis  . GOITER, MULTINODULAR  . DM (diabetes mellitus), type 2, uncontrolled  . HYPERCHOLESTEROLEMIA  . DEPRESSION, CHRONIC  . OBSTRUCTIVE SLEEP APNEA  . PERIPHERAL NEUROPATHY  . HYPERTENSION  . UNSPECIFIED VENOUS INSUFFICIENCY  . DERMATITIS, HANDS  . PSORIASIS  . Edema  . Osteoarthritis of knee  . At high risk for falls  . Low back pain  . CKD (chronic kidney disease), stage II  . Anemia   Reviewed problem list.  Medications- reviewed and updated Chief complaint-noted  Objective: BP 140/72  Pulse 71  Temp(Src) 97.6 F (36.4 C) (Oral)  Ht 5' 9"  (1.753 m)  Wt 319 lb (144.697 kg)  BMI 47.09 kg/m2 Gen: NAD, resting comfortably, anxious when discussing injection CV: RRR no murmurs rubs or gallops Lungs: CTAB no crackles, wheeze, rhonchi Neuro: grossly normal, moves all extremities Bilateral knees with moderate crepitus.   Bilateral Knee Injections for OA: Consent obtained and verified. Sterile betadine prep. Furthur cleansed with alcohol. Topical analgesic spray: Ethyl chloride. Joint: bilateral knee injections Approached in typical fashion with: medial approach Completed without difficulty Meds: 4 cc lidocaine 1%, 1 cc 92mmL Needle:1.5 inch 25 gauge Aftercare instructions and Red flags  advised. Patient experienced immediate relief of symptoms and noted 0-1/10 pain with walking  Assessment/Plan:  Discussed need for return visit to discuss back pain and DM as well as f/u neuropathy on lyrica.  DId tell patient she can titrate lyrica up to 1010mBID if no relief of pain at 2 weeks.

## 2012-09-09 NOTE — Assessment & Plan Note (Signed)
Patient experienced immediate relief after injection. Will follow over next 2-3 months and refer vs repeat injection at 3 months.

## 2012-09-09 NOTE — Assessment & Plan Note (Signed)
With large cuff, systolic BP mildly elevated. Previously <130. Given patients history of edema and leg wounds, will try to avoid amlodipine if able. If elevated at next appointment, will restart.

## 2012-09-24 ENCOUNTER — Encounter (HOSPITAL_BASED_OUTPATIENT_CLINIC_OR_DEPARTMENT_OTHER): Payer: Medicare HMO | Attending: Internal Medicine

## 2012-09-24 DIAGNOSIS — I872 Venous insufficiency (chronic) (peripheral): Secondary | ICD-10-CM | POA: Insufficient documentation

## 2012-09-24 DIAGNOSIS — E1169 Type 2 diabetes mellitus with other specified complication: Secondary | ICD-10-CM | POA: Insufficient documentation

## 2012-09-24 DIAGNOSIS — L97809 Non-pressure chronic ulcer of other part of unspecified lower leg with unspecified severity: Secondary | ICD-10-CM | POA: Insufficient documentation

## 2012-09-28 ENCOUNTER — Ambulatory Visit: Payer: Medicare HMO | Admitting: Pharmacist

## 2012-10-08 ENCOUNTER — Telehealth: Payer: Self-pay | Admitting: Family Medicine

## 2012-10-08 DIAGNOSIS — E1165 Type 2 diabetes mellitus with hyperglycemia: Secondary | ICD-10-CM

## 2012-10-08 DIAGNOSIS — I10 Essential (primary) hypertension: Secondary | ICD-10-CM

## 2012-10-08 DIAGNOSIS — E1149 Type 2 diabetes mellitus with other diabetic neurological complication: Secondary | ICD-10-CM

## 2012-10-08 DIAGNOSIS — E785 Hyperlipidemia, unspecified: Secondary | ICD-10-CM

## 2012-10-08 DIAGNOSIS — F329 Major depressive disorder, single episode, unspecified: Secondary | ICD-10-CM

## 2012-10-08 DIAGNOSIS — G609 Hereditary and idiopathic neuropathy, unspecified: Secondary | ICD-10-CM

## 2012-10-08 NOTE — Telephone Encounter (Signed)
Husband is calling because they are changing their pharmacy to [facility] and they need all of her Rx's faxed to [phone].  On the cover letter it needs to have her DOB, her address and Member ID # which is [id number].

## 2012-10-12 ENCOUNTER — Telehealth: Payer: Self-pay | Admitting: Family Medicine

## 2012-10-12 MED ORDER — "INSULIN SYRINGE-NEEDLE U-100 30G X 1/2"" 1 ML MISC": Status: DC

## 2012-10-12 MED ORDER — METFORMIN HCL 1000 MG PO TABS: 1000.0000 mg | ORAL_TABLET | Freq: Two times a day (BID) | ORAL | Status: DC

## 2012-10-12 MED ORDER — INSULIN PEN NEEDLE 29G X 12.7MM MISC: 1.0000 | Freq: Four times a day (QID) | Status: DC

## 2012-10-12 MED ORDER — ACCU-CHEK FASTCLIX LANCETS MISC: 1.0000 | Freq: Three times a day (TID) | Status: DC

## 2012-10-12 MED ORDER — ATORVASTATIN CALCIUM 40 MG PO TABS: 80.0000 mg | ORAL_TABLET | Freq: Every day | ORAL | Status: DC

## 2012-10-12 MED ORDER — TRIAMTERENE-HCTZ 37.5-25 MG PO TABS: 1.0000 | ORAL_TABLET | Freq: Every day | ORAL | Status: DC

## 2012-10-12 MED ORDER — QUINAPRIL HCL 40 MG PO TABS: 40.0000 mg | ORAL_TABLET | Freq: Every day | ORAL | Status: DC

## 2012-10-12 MED ORDER — METOPROLOL SUCCINATE ER 50 MG PO TB24: 50.0000 mg | ORAL_TABLET | Freq: Every day | ORAL | Status: DC

## 2012-10-12 MED ORDER — VENLAFAXINE HCL ER 75 MG PO CP24: 225.0000 mg | ORAL_CAPSULE | Freq: Every day | ORAL | Status: DC

## 2012-10-12 MED ORDER — GLUCOSE BLOOD VI STRP: ORAL_STRIP | Status: DC

## 2012-10-12 MED ORDER — ASPIRIN EC 81 MG PO TBEC: 81.0000 mg | DELAYED_RELEASE_TABLET | Freq: Every day | ORAL | Status: DC

## 2012-10-12 MED ORDER — EXENATIDE 10 MCG/0.04ML ~~LOC~~ SOPN: 10.0000 ug | PEN_INJECTOR | Freq: Two times a day (BID) | SUBCUTANEOUS | Status: DC

## 2012-10-12 MED ORDER — PREGABALIN 50 MG PO CAPS: 50.0000 mg | ORAL_CAPSULE | Freq: Two times a day (BID) | ORAL | Status: DC

## 2012-10-12 MED ORDER — INSULIN ASPART 100 UNIT/ML ~~LOC~~ SOLN: 60.0000 [IU] | Freq: Two times a day (BID) | SUBCUTANEOUS | Status: DC

## 2012-10-12 MED ORDER — INSULIN GLARGINE 100 UNIT/ML ~~LOC~~ SOLN: 100.0000 [IU] | Freq: Two times a day (BID) | SUBCUTANEOUS | Status: DC

## 2012-10-12 NOTE — Telephone Encounter (Signed)
Clarification needed for injection of 2 times daily or 4 times daily.

## 2012-10-12 NOTE — Telephone Encounter (Signed)
Placed in to be faxed area.

## 2012-10-12 NOTE — Telephone Encounter (Signed)
4x daily. Thanks Janett Billow.

## 2012-10-12 NOTE — Telephone Encounter (Signed)
The pharmacy is confused because the Rx for the needles Says 4X daily as well as 2X daily.  I think that it should be 2X daily as this is what her medications say, but will forward to MD for clarification [name], [name]

## 2012-10-12 NOTE — Telephone Encounter (Signed)
Verified with pharmacy .

## 2012-10-13 ENCOUNTER — Ambulatory Visit: Payer: Medicare HMO | Admitting: Pharmacist

## 2012-10-13 ENCOUNTER — Ambulatory Visit: Payer: Medicare HMO | Admitting: Family Medicine

## 2012-10-20 ENCOUNTER — Telehealth: Payer: Self-pay | Admitting: *Deleted

## 2012-10-20 DIAGNOSIS — E7800 Pure hypercholesterolemia, unspecified: Secondary | ICD-10-CM

## 2012-10-20 NOTE — Telephone Encounter (Signed)
Received fax from [facility] requesting prior authorization of atorvastatin .  Form placed in MD's box for completion.  Nolene Ebbs, RN

## 2012-10-21 NOTE — Assessment & Plan Note (Signed)
Has been on atorvastatin 80 since 10/2010 and previously LDL difficult to control apparently. Most recent LDL <50. Prior Auth filled out 10/21/12.

## 2012-10-21 NOTE — Telephone Encounter (Signed)
Placed in area to be faxed.

## 2012-10-22 ENCOUNTER — Ambulatory Visit (INDEPENDENT_AMBULATORY_CARE_PROVIDER_SITE_OTHER): Payer: Medicare HMO | Admitting: Pharmacist

## 2012-10-22 ENCOUNTER — Encounter: Payer: Self-pay | Admitting: Pharmacist

## 2012-10-22 ENCOUNTER — Ambulatory Visit (INDEPENDENT_AMBULATORY_CARE_PROVIDER_SITE_OTHER): Payer: Medicare HMO | Admitting: Family Medicine

## 2012-10-22 ENCOUNTER — Encounter: Payer: Self-pay | Admitting: Family Medicine

## 2012-10-22 VITALS — BP 146/78 | HR 63 | Ht 69.0 in | Wt 319.0 lb

## 2012-10-22 VITALS — BP 162/78 | HR 60 | Ht 69.0 in | Wt 319.0 lb

## 2012-10-22 DIAGNOSIS — E1165 Type 2 diabetes mellitus with hyperglycemia: Secondary | ICD-10-CM

## 2012-10-22 DIAGNOSIS — M1710 Unilateral primary osteoarthritis, unspecified knee: Secondary | ICD-10-CM

## 2012-10-22 DIAGNOSIS — G609 Hereditary and idiopathic neuropathy, unspecified: Secondary | ICD-10-CM

## 2012-10-22 DIAGNOSIS — I10 Essential (primary) hypertension: Secondary | ICD-10-CM

## 2012-10-22 LAB — COMPREHENSIVE METABOLIC PANEL
ALT: 12 U/L (ref 0–35)
AST: 14 U/L (ref 0–37)
Albumin: 3.9 g/dL (ref 3.5–5.2)
CO2: 29 mEq/L (ref 19–32)
Calcium: 9.8 mg/dL (ref 8.4–10.5)
Chloride: 104 mEq/L (ref 96–112)
Potassium: 3.6 mEq/L (ref 3.5–5.3)
Sodium: 143 mEq/L (ref 135–145)
Total Protein: 6.6 g/dL (ref 6.0–8.3)

## 2012-10-22 LAB — POCT GLYCOSYLATED HEMOGLOBIN (HGB A1C): Hemoglobin A1C: 10.2

## 2012-10-22 MED ORDER — TRIAMTERENE-HCTZ 37.5-25 MG PO TABS: 1.0000 | ORAL_TABLET | Freq: Every day | ORAL | Status: DC

## 2012-10-22 MED ORDER — INSULIN ASPART 100 UNIT/ML ~~LOC~~ SOLN: 60.0000 [IU] | Freq: Every day | SUBCUTANEOUS | Status: DC

## 2012-10-22 NOTE — Assessment & Plan Note (Signed)
Poorly controlled today but patient has not taken meds yet. At last visit without amlodipine, BP was 140/72. WIll make no changes. Asked patient to take meds before next visit.   Patient on both triamterene and ace-i. At risk for hyperkalemia so will check potassium today. If elevated, will stop triamterene (even without hyperkalemia will strongly consider stopping as appears patient used to be on this while she was on torsemide and she is no longer taking this).

## 2012-10-22 NOTE — Assessment & Plan Note (Signed)
a1c worsened today to >10. Patient very upset by this information. SHe is trying very hard to improve her control but has many barriers (financial, home stressors with husband who has MS, polypharmacy and difficulty understanding all medications). I appreciate pharmacy's help with comanagement and agree with change to 60 short acting in am and 90 long acting in PM given afternoon hypoglycemia when using 70/30 in AM. WIll plan to convert back to lantus if it becomes available with [facility] help.

## 2012-10-22 NOTE — Patient Instructions (Addendum)
It was nice to see you today!  Use your Novolin 70/30 in the evening for the second meal. Inject 100 units.  Use your Novolog at your first meal. Inject 60 units. No Lantus at this time due to not available.   When you can afford to get back on the Byetta, start it again.  If you see multiple readings <70 in one week, let us know.  Return to see Dr. Yong Channel or Dr. Valentina Lucks in a month.

## 2012-10-22 NOTE — Assessment & Plan Note (Signed)
Injection 3/18. Pain significantly improved since that time. Hopeful she will have good response for up to 3 months then would consider repeat injection. If fails conservative measures, will refer to ortho.

## 2012-10-22 NOTE — Patient Instructions (Addendum)
1. Knee pain-i am glad the injection helped.  2. Blood pressure-please continue current medications. We are going to check some labs today.  3. DM-see other handout. Let me know if they get you the new medications through the help.  4. Look online for  bariatric surgery information classes. This likely could help you in the long run.  5. Make sure to go get your pap smear and have your results sent to [place]. We are happy to do it for you if you like.  6. Go back and see the wound care doctors.   See Dr. Valentina Lucks in 1 month for diabetes and see me in 2 months to follow up on knee pain,  Dr. Yong Channel  P.s. You are welcome to see me sooner if you want to talk about the eczema on your hands.

## 2012-10-22 NOTE — Progress Notes (Signed)
  Subjective:    Patient ID: [name], female    DOB: 04/25/1956, 57 y.o.   MRN: [id number]  HPI Patient arrives to clinic in good spirits but under significant stress due to her husband's MS and financial constraints.  She brings with her a glucometer and her medications.  She reports that she could not afford her Byetta or Lantus so she has been taking Novolin 70/30 60 units in the morning and 90 units in the evening. She reports that she has ~15 vials at home and a few vials of Novolog.   Patient reports that she is still interested in losing weight and would like to restart the Byetta when she can afford it (Nov 11, 2012).    Review of Systems     Objective:   Physical Exam   Novolog Samples 2 vials Lot# [id number] Exp: 06/2013     Assessment & Plan:   Diabetes of many yrs duration currently under fair control of blood glucose based on   Lab Results  Component Value Date   HGBA1C 9.5 04/03/2012    ,home fasting CBG readings of 107-326 and random CBG readings of 67-471. Control is suboptimal due to financial difficulties in affording medications. Blood glucose log shows multiple hypoglycemic events, mostly occurring in the late afternoon.  Able to verbalize appropriate hypoglycemia management plan. Instructed patient to take Novolog (insulin aspart) 60 units with her first meal of the day and to take Novolin 70/30 insulin 100 units with her second meal of the day. Patient to restart Byetta when she can afford it. Patient was referred to [employer] (met with Erenest Rasher today). Written patient instructions provided.  Prefer to return to Lantus 100units BID, Novolog 60 Units with early meal and 90 units with late meal AND Byetta with her two meals daily.   Follow up in  Pharmacist Clinic Visit in one month.   Total time in face to face counseling 20 minutes.  Patient seen with Nicoletta Ba, PharmD Candidate.

## 2012-10-22 NOTE — Telephone Encounter (Signed)
Received fax from [facility] authorization for atorvastatin denied.  Info placed in Dr. Ansel Bong box.  Nolene Ebbs, RN

## 2012-10-22 NOTE — Assessment & Plan Note (Signed)
Diabetes of many yrs duration currently under fair control of blood glucose based on   Lab Results  Component Value Date   HGBA1C 9.5 04/03/2012    ,home fasting CBG readings of 107-326 and random CBG readings of 67-471. Control is suboptimal due to financial difficulties in affording medications. Blood glucose log shows multiple hypoglycemic events, mostly occurring in the late afternoon.  Able to verbalize appropriate hypoglycemia management plan. Instructed patient to take Novolog (insulin aspart) 60 units with her first meal of the day and to take Novolin 70/30 insulin 100 units with her second meal of the day. Patient to restart Byetta when she can afford it. Patient was referred to [employer] (met with Erenest Rasher today). Written patient instructions provided.  Prefer to return to Lantus 100units BID, Novolog 60 Units with early meal and 90 units with late meal AND Byetta with her two meals daily.   Follow up in  Pharmacist Clinic Visit in one month.   Total time in face to face counseling 20 minutes.  Patient seen with Nicoletta Ba, PharmD Candidate.

## 2012-10-22 NOTE — Assessment & Plan Note (Signed)
Change back to gabapentin due to affordability with understanding that control is suboptimal.

## 2012-10-22 NOTE — Progress Notes (Signed)
Subjective:   # Hypertension BP Readings from Last 3 Encounters:  10/22/12 146/78  10/22/12 162/78  09/08/12 140/72   Home BP monitoring-no Compliant with medications-yes(amlodipine 25m, metoprolol 562m quinapril [id number]) without side effects. Patient did not take yet today.  Denies any CP, HA, SOB, blurry vision, LE edema, transient weakness other than that due to pain, orthopnea, PND.   # OA knees injection 3/18 into bilateral knees. Pain 1/10 right knee. Left knee 1-2/10 down from 4/10 in both knees. Numbness and tingling and pain in feet but that's from neuropathy. No falls related to pain as patient had previously.   # DIABETES Type II with neuropathy Medications taking and tolerating-cannot afford byetta or lantus. Has been taking 70/30 60 units in AM and 90 units in PM. She has had frequent afternoon hypoglycemic events before nighttime meal. Byetta seemed to be helping pating with weight loss and she is hopeful to restart it.  CBGs  typically 300s over last few weeks with above reported hypoglycemic events. Patient couldn't afford lyrica and is taking gabapentin again.   Diet-went to DM education, only occasionally eating sweets, has lost 18 lbs over last few months (stable since last visit). Overall feels like eating better.  Regular Exercise- no exercise at this time. Planning on silver sneakers soon.   Last eye exam-will refer to optho today  Last foot exam-08/27/12 Last microalbumin/on ace inhibitor-on ace Daily foot monitoring-yes On aspirin-yes On statin-yes  ROS- continues to endorse Polyuria/Polydipsia/nocturia and worsened since out of insulin lantus. Denies vision changes. ENdorses neuropathy (on gabapentin)   ROS--See HPI  Past Medical History-poorly controlled DM, high risk falls, venous insufficiency, HLD, HTN, depression, OSA Reviewed problem list.  Medications- reviewed and updated Chief complaint-noted  Objective: BP 146/78  Pulse 63  Ht  5' 9"  (1.753 m)  Wt 319 lb (144.697 kg)  BMI 47.09 kg/m2 Gen: NAD, resting comfortably in chair, becomes anxious at times when discussing life stressors at home CV: RRR no murmurs rubs or gallops Lungs: CTAB no crackles, wheeze, rhonchi Neuro: grossly normal, moves all extremities Bilateral knees with moderate crepitus.  Ext: venous stasis changes on bilateral legs. Mild erythema to about 2/3 up right calf without associated warmth or edema. Bilateral 1+ edema.   Assessment/Plan:  2 month follow up for OA HM-Kathy RicMarvel Planll perform PAP, patient's gynecologist and patient to have records sent here.

## 2012-10-23 NOTE — Telephone Encounter (Signed)
Attempted to resubmit as 86m daily as denial appeared to have issue with 2 470mtablets not 8084motal dosing.

## 2012-10-23 NOTE — Addendum Note (Signed)
Addended by: Marin Olp on: 10/23/2012 12:27 PM   Modules accepted: Orders

## 2012-10-23 NOTE — Progress Notes (Signed)
Patient ID: [name], female   DOB: [birth year], 57 y.o.   MRN: [id number] Reviewed:  Agree with Dr. Graylin Shiver documentation and management.

## 2012-10-26 ENCOUNTER — Telehealth: Payer: Self-pay | Admitting: Family Medicine

## 2012-10-26 MED ORDER — ATORVASTATIN CALCIUM 40 MG PO TABS: 40.0000 mg | ORAL_TABLET | Freq: Every day | ORAL | Status: DC

## 2012-10-26 NOTE — Telephone Encounter (Signed)
See phone note 4/17-addendum added.

## 2012-10-26 NOTE — Addendum Note (Signed)
Addended by: Marin Olp on: 10/26/2012 03:41 PM   Modules accepted: Orders

## 2012-10-26 NOTE — Telephone Encounter (Signed)
Changed atorvastatin to 40 mg as 80 mg dose was rejected by [facility] despite prior authorization.

## 2012-10-27 NOTE — Telephone Encounter (Signed)
Received fax from [employer]---Atorvastatin 80 mg tablet available for patient and no authorization is required.  Nolene Ebbs, RN

## 2012-12-18 ENCOUNTER — Encounter: Payer: Self-pay | Admitting: Family Medicine

## 2012-12-18 ENCOUNTER — Ambulatory Visit (INDEPENDENT_AMBULATORY_CARE_PROVIDER_SITE_OTHER): Payer: Medicare HMO | Admitting: Family Medicine

## 2012-12-18 VITALS — BP 156/84 | HR 71 | Temp 98.1°F | Ht 69.0 in | Wt 318.0 lb

## 2012-12-18 DIAGNOSIS — L02419 Cutaneous abscess of limb, unspecified: Secondary | ICD-10-CM

## 2012-12-18 DIAGNOSIS — S81801A Unspecified open wound, right lower leg, initial encounter: Secondary | ICD-10-CM | POA: Insufficient documentation

## 2012-12-18 DIAGNOSIS — L03115 Cellulitis of right lower limb: Secondary | ICD-10-CM

## 2012-12-18 MED ORDER — SULFAMETHOXAZOLE-TRIMETHOPRIM 800-160 MG PO TABS: 2.0000 | ORAL_TABLET | Freq: Two times a day (BID) | ORAL | Status: DC

## 2012-12-18 NOTE — Assessment & Plan Note (Signed)
Patient with prior episodes of cellulitis treated with hyperbaric treatment, also has been hospitalized for treatment failure in the past.  Will start with oral abx to cover MRSA (Bactrim) and application of Unna boot.  She is to keep leg elevated.  I have very low suspicion for VTE, as edema is mostly anterior aspect of leg and associated with superficial opening in the skin.  She has a previously scheduled appt with her primary physician and will keep that for July 3rd. We discussed red flags that should prompt her to come back or call before then, including possible side effects from Bactrim (including SJS).

## 2012-12-18 NOTE — Progress Notes (Signed)
  Subjective:    Patient ID: [name], female    DOB: January 25, 1956, 57 y.o.   MRN: [id number]  HPI Patient here for SDA for complaint of R leg redness and edema.  She has DM with most recent A1C in May 2014 of 10%.  She reports worsening redness over the past 2 days, similar to prior episodes of cellulitis in lower extremities.  Had been seeing wound center and having hyperbaric treatment for 2 yrs for healing of DM wound.  The current area of concern came about fairly rapidly over past 2 days.  No fevers or chills; has had some generalized malaise.   Has had to cut the elastic on her tube socks to accommodate the leg swelling.  No calf pain.  No shortness of breath.    Review of Systems     Objective:   Physical Exam Generally well appearing, no apparent distress HEENT neck supple.  LEs: Anterior aspect Right leg with marked erythema above ankle, up to area distal to knee.  Deroofed blister 3x2cm in middle of R shin.  No calf tenderness, no cords.  Full active/passive ROM ankles bilaterally. No fluctuance over area of redness on R shin.        Assessment & Plan:

## 2012-12-18 NOTE — Patient Instructions (Addendum)
It was a pleasure to see you today. For your right leg soft tissue infection ("cellulitis"), I am ordering the following:  1. We are placing an Unna boot on the leg to keep the swelling controlled and for the antimicrobial properties to fight infection;  2. I sent a prescription for Bactrim DS (also known as Septra DS), take 2 tablets together, by mouth, twice daily for 10 days (total of 4 tabs in a day).     It is important that you keep the appointment already scheduled with Dr. Yong Channel for Thursday, July 3 at 2:00pm.    Call our office if you have fevers over 101F, if you feel that you are becoming more ill, if you have increasing pain in the right leg, or with other concerns.  Stop the Bactrim immediately and call our office if you develop a generalized rash, or if you have lesions or burning in your mouth.

## 2012-12-24 ENCOUNTER — Encounter: Payer: Self-pay | Admitting: Family Medicine

## 2012-12-24 ENCOUNTER — Ambulatory Visit (INDEPENDENT_AMBULATORY_CARE_PROVIDER_SITE_OTHER): Payer: Medicare HMO | Admitting: Family Medicine

## 2012-12-24 VITALS — BP 146/73 | HR 70 | Temp 98.4°F | Wt 325.0 lb

## 2012-12-24 DIAGNOSIS — IMO0002 Reserved for concepts with insufficient information to code with codable children: Secondary | ICD-10-CM

## 2012-12-24 DIAGNOSIS — L02419 Cutaneous abscess of limb, unspecified: Secondary | ICD-10-CM

## 2012-12-24 DIAGNOSIS — L03119 Cellulitis of unspecified part of limb: Secondary | ICD-10-CM

## 2012-12-24 DIAGNOSIS — IMO0001 Reserved for inherently not codable concepts without codable children: Secondary | ICD-10-CM

## 2012-12-24 DIAGNOSIS — I10 Essential (primary) hypertension: Secondary | ICD-10-CM

## 2012-12-24 DIAGNOSIS — E1165 Type 2 diabetes mellitus with hyperglycemia: Secondary | ICD-10-CM

## 2012-12-24 DIAGNOSIS — L03115 Cellulitis of right lower limb: Secondary | ICD-10-CM

## 2012-12-24 MED ORDER — QUINAPRIL HCL 40 MG PO TABS: 40.0000 mg | ORAL_TABLET | Freq: Every day | ORAL | Status: DC

## 2012-12-24 NOTE — Progress Notes (Signed)
[facility] Garret Reddish, MD Phone: [phone]  Subjective:   # Right leg redness and edema Small ulcer noted on right leg likely due to venous stasis changes a week ago. Had unnaboot applied at that time and started on bactrim. Patient denies fever/chills/expanding redness. Actually think intensity of redness has improved. Edema has improved as well. Denies chest pain or shortness of breath. Compliant with bactrim and has kept unnaboot on as instructed.   # DIABETES Type II Medications taking and tolerating-taking 60 units of novolog and 60 units of 70/30 before lunch as well as  90 units of novolog and 60 units of 70/30 before dinner. Patient has 12 vials of 70/30. 4 of novolog and 4 of lantus. She has been considering "experimenting with lantus". She has not called in past as instructed for highs or lows. Stressed this to patient. Often forgets doses.  Blood Sugars per patient-only checking in AM and typically around 300 or greater Diet-multiple dietary indiscretions, eats enormous dinner and snacks through evening Regular Exercise-no  Health Maintenance Due  Topic Date Due  . Pap Smear  01/13/2012  On Aspirin-yes On statin-yes lipitor 2m.  Daily foot monitoring-most days  ROS- Endorses Polyuria,Polydipsia, nocturia. Denies Vision changes. Endorses feet or hand numbness/pain/tingling with known neuropathy. Endorses Hypoglycemia symptoms (shaky, sweaty, hungry,  anxious, tremor, palpitations, confusion, behavior change) at least 2-3x a week in late afternoon when she eats a lighter lunch and doesn't eat breakfast.   Hemoglobin a1c:  Lab Results  Component Value Date   HGBA1C 10.2 10/22/2012   HGBA1C 9.5 04/03/2012   HGBA1C 9.1* 01/07/2012   # Hypertension BP Readings from Last 3 Encounters:  12/24/12 146/73  12/18/12 156/84  10/22/12 146/78  Home BP monitoring-no Compliant with medications-does not have lisinopril or triamterene-hctz. Has amlodipine and  metoprolol.  Denies any CP, HA, SOB, blurry vision, worsening LE edema (actually improved in right leg from last week), transient weakness, orthopnea, PND.    ROS--See HPI  Past Medical History Patient Active Problem List   Diagnosis Date Noted  . DM (diabetes mellitus), type 2, uncontrolled 02/18/2008    Priority: High  . At high risk for falls 06/12/2011    Priority: Medium  . HYPERCHOLESTEROLEMIA 03/20/2010    Priority: Medium  . HYPERTENSION 03/20/2010    Priority: Medium  . PERIPHERAL NEUROPATHY 01/12/2009    Priority: Medium  . DEPRESSION, CHRONIC 10/06/2007    Priority: Medium  . OBSTRUCTIVE SLEEP APNEA 06/12/2007    Priority: Medium  . CKD (chronic kidney disease), stage II 06/04/2012    Priority: Low  . Edema 04/17/2011    Priority: Low  . UNSPECIFIED VENOUS INSUFFICIENCY 05/28/2010    Priority: Low  . Cellulitis of leg, right 12/18/2012  . Anemia 06/19/2012  . Low back pain 03/05/2012  . Osteoarthritis of knee 04/23/2011  . PSORIASIS 05/28/2010  . DERMATITIS, HANDS 05/02/2009  . GOITER, MULTINODULAR 05/13/2007   Reviewed problem list.  Medications- reviewed and updated Chief complaint-noted  Objective: BP 146/73  Pulse 70  Temp(Src) 98.4 F (36.9 C) (Oral)  Wt 325 lb (147.419 kg)  BMI 47.97 kg/m2 Gen: NAD, morbidly obese, tearful at times upset that she is not doing better with DM CV: RRR no murmurs rubs or gallops Lungs: CTAB no crackles, wheeze, rhonchi Skin/Ext: Unnaboot in place and removed. Bilateral lower extremities with 1+ edema with R>L. Both legs erythematous with venous stasis changes. Right leg with mildly more intense erythema compared to left going up  to 10cm below tibial plateau. Small 3 x 2 cm lesion in middle of right shin that appears to be healing. No warmth to surrounding tissues.  Redressed area with unnaboot.  Normal strength and ROM in lower extremities.   Assessment/Plan:

## 2012-12-24 NOTE — Assessment & Plan Note (Addendum)
Poorly controlled but improved. Interesting that better though only on amlodipine and metoprolol. Will try to restart quinapril by [facility] in hopes of reaching goal <140/90. Sent at this time.

## 2012-12-24 NOTE — Assessment & Plan Note (Signed)
Appears to be improving with unnaboot and bactrim. To complete therapy. Rewrapped with unnaboot. Follow up in 1 week for removal and monitoring.

## 2012-12-24 NOTE — Patient Instructions (Addendum)
1. The leg is looking better. Finish the antibiotics. See me in 1 week for [place] to take the boot off again.   *Call our office if you have fevers over 101F, if you feel that you are becoming more ill, if you have increasing pain in the right leg, or with other concerns.   2. For your diabetes:  *DO NOT TAKE NOVOLOG/ASPART or LANTUS. ONLY TAKE 70/30.   *Take 40 units of 70/30 before lunch and 100 units of 70/30 before dinner.   *do not make any changes without calling us. We want to know about any low blood sugars.   *See me in 1 week. Check your blood sugar in the morning, before lunch, 2 hours after lunch, before dinner, and 2 hours after dinner.   *We will have you see Dr. Valentina Lucks in 1 month, you can book that today.   3. For your blood pressure:  *it was slightly high.   * I am going to try to get your quinapril again through [facility].   See me next week, Dr. Yong Channel

## 2012-12-24 NOTE — Assessment & Plan Note (Addendum)
Poorly controlled. Frequent missed doses due to finanacial and home stressors. Patient has large # of vials 70/30 and consulted with Dr. Valentina Lucks. We will try to run down 70/30 supply then transition to Lantus (likely at least 80 days at current rates). She will take 40 units 70/30 before breakfast (decreased by 20 to try to prevent afternoon lows) and 100 units 70/30 in PM. Continue metformin. She will take byetta if she can get it again.   Sent to [facility] after visit for [facility] aid as well as [facility] coordinator to see if patient can get more help on medications (# of Rx confuses her and frequent changes in pharmacies). I went through each medication with patient on AVS and told her what they were for. She brought all meds but insulin to this visit and I praised her for this.   I am concerned by patient's changing and "experimenting" with insulin without consulting our office and i stressed to her that I wanted to know about any lows and any plans she had. F/u 1 week with me. 1 month with Dr. Valentina Lucks.

## 2013-01-06 ENCOUNTER — Ambulatory Visit (INDEPENDENT_AMBULATORY_CARE_PROVIDER_SITE_OTHER): Payer: Medicare HMO | Admitting: Family Medicine

## 2013-01-06 ENCOUNTER — Encounter: Payer: Self-pay | Admitting: Family Medicine

## 2013-01-06 VITALS — BP 134/68 | HR 69 | Temp 98.0°F | Wt 313.8 lb

## 2013-01-06 DIAGNOSIS — IMO0001 Reserved for inherently not codable concepts without codable children: Secondary | ICD-10-CM

## 2013-01-06 DIAGNOSIS — L02419 Cutaneous abscess of limb, unspecified: Secondary | ICD-10-CM

## 2013-01-06 DIAGNOSIS — G609 Hereditary and idiopathic neuropathy, unspecified: Secondary | ICD-10-CM

## 2013-01-06 DIAGNOSIS — L03119 Cellulitis of unspecified part of limb: Secondary | ICD-10-CM

## 2013-01-06 DIAGNOSIS — L03115 Cellulitis of right lower limb: Secondary | ICD-10-CM

## 2013-01-06 DIAGNOSIS — I10 Essential (primary) hypertension: Secondary | ICD-10-CM

## 2013-01-06 DIAGNOSIS — E1165 Type 2 diabetes mellitus with hyperglycemia: Secondary | ICD-10-CM

## 2013-01-06 NOTE — Patient Instructions (Addendum)
Sign up for mychart. I want you to let me know about ANY blood sugar less than 90.  Ask your son to set an alarm at night before dinner to help you remember to taking your nighttime dose of insulin.  Bring this sheet to next visit.   1. The leg is looking better. We can leave the boot off for now. As long as it is still just a scab and not open next Monday, you can start going to the [facility] for swimming.    *Call our office if you have fevers over 101F, if you feel that you are becoming more ill, if you have increasing pain in the right leg, or with other concerns.   *Please wear compression stockings as often as possible and elevate your legs! Bring them with you to next visit.   2. For your diabetes:  *DO NOT TAKE NOVOLOG/ASPART or LANTUS. ONLY TAKE 70/30.    *call me when you have about a week left of 70/30.   *Take 60 units of 70/30 before lunch and 110 units of 70/30 before dinner.   *do not make any changes without calling us. We want to know about any low blood sugars less than 90.   *See me in 1 week. Check your blood sugar in the morning, before lunch, 2 hours after lunch, before dinner, and 2 hours after dinner.   3. For your blood pressure:  *keep taking current medications. It looks great today!   See Dr. Valentina Lucks in 2 weeks and me in a month, Dr. Yong Channel  Health Maintenance Due  Topic Date Due  . Pap Smear -you can schedule a visit before a month for a well woman exam 01/13/2012

## 2013-01-07 NOTE — Assessment & Plan Note (Addendum)
Keep AM dose at 60 units since no afternoon lows and add 110 units at night (increase in 10). Patient still hoping to get Byetta [facility] helping her). Very poorly controlled (has lost about 10 lbs likely due to diuresis from hyperglycemia) as patient only taking 60 units of insulin about 1/2 the days of the week when plan was for 40 in Am and 100 in PM. Patient made adjustment again without contacting us (going up to 60) and I have stressed to her we want to be involved in any changes. She is going to try to get son to set alarm for nighttime insulin for her. She is going to sign up for mychart to send me messages if any lows on new regimen. Follow up in 2 weeks.   With history of neuropathy and frequent lesions on lower legs, will send to podiatry to help prevent diabetic wounds on feet (To this point these have been minimal).

## 2013-01-07 NOTE — Assessment & Plan Note (Signed)
cellultis has resolved. Small wound left healing well. Per AVS for care. Discussed compression stockings to help. Patient with good pulses but could still consider ABIs at some point due to poor wound healing though likely related to venous stasis primarily.

## 2013-01-07 NOTE — Assessment & Plan Note (Addendum)
Well controlled. Continue current medications: amlodipine 75m, qunapril 465m metoprolol.

## 2013-01-07 NOTE — Progress Notes (Signed)
[name] Garret Reddish, MD Phone: [phone]  Subjective:   # DIABETES Type II Medications taking and tolerating-taking 60 units 70/30 in AM mainly, forgets at least 3x a week to take evening dose. Was told last time to take 40 units at night and 100 in PM. No afternoon lows with 60 units in AM even if taking dose nitght before.  Blood Sugars per patient-fasting-400-500 in AM with some in 300s if takes insulin the night before.  Regular Exercise-no, considering [employer] for swimming  Health Maintenance Due  Topic Date Due  . Pap Smear -scheduled with her OBGYN 01/13/2012  UPtodate on DM HM.  Last microalbumin/on ace inhibitor-on ace On Aspirin-yes On statin-yes Daily foot monitoring-yes  ROS- Endorses Polyuria,Polydipsia, nocturia. No recent changes in vision. Endorses chronic issues with neuropathy-hits legs frequently due to pain. Denies Hypoglycemia symptoms (shaky, sweaty, hungry, weakness, anxious, tremor, palpitations, confusion, behavior change).   Hemoglobin a1c:  Lab Results  Component Value Date   HGBA1C 10.2 10/22/2012   HGBA1C 9.5 04/03/2012   HGBA1C 9.1* 01/07/2012   # Right leg redness and edema Small ulcer noted on right leg likely due to venous stasis changes on 12/18/12 when patient came in for office visit. Has had unnaboot x2 in this time. Finished course of bactrim for local cellulitis. Patient denies fever/chills/expanding redness. Diffuse redness in leg c/w venous stasis changes at baseline. Leg previously larger than left by some degree now after unnaboot and compression R<L in size.    # Hypertension BP Readings from Last 3 Encounters:  01/06/13 134/68  12/24/12 146/73  12/18/12 156/84   Home BP monitoring-no Compliant with medications-es without side effects, got quinapril in mail so now on amlodipine, metoprolol, and quinapril Denies any CP, HA, SOB, blurry vision, worsened LE edema, transient weakness, orthopnea, PND.    ROS--See HPI  Past Medical History Patient Active Problem List   Diagnosis Date Noted  . DM (diabetes mellitus), type 2, uncontrolled 02/18/2008    Priority: High  . At high risk for falls 06/12/2011    Priority: Medium  . HYPERCHOLESTEROLEMIA 03/20/2010    Priority: Medium  . HYPERTENSION 03/20/2010    Priority: Medium  . PERIPHERAL NEUROPATHY 01/12/2009    Priority: Medium  . DEPRESSION, CHRONIC 10/06/2007    Priority: Medium  . OBSTRUCTIVE SLEEP APNEA 06/12/2007    Priority: Medium  . CKD (chronic kidney disease), stage II 06/04/2012    Priority: Low  . Edema 04/17/2011    Priority: Low  . UNSPECIFIED VENOUS INSUFFICIENCY 05/28/2010    Priority: Low  . Cellulitis of leg, right 12/18/2012  . Anemia 06/19/2012  . Low back pain 03/05/2012  . Osteoarthritis of knee 04/23/2011  . PSORIASIS 05/28/2010  . DERMATITIS, HANDS 05/02/2009  . GOITER, MULTINODULAR 05/13/2007  Reviewed problem list.  Medications- reviewed and updated Chief complaint-noted  Objective: BP 134/68  Pulse 69  Temp(Src) 98 F (36.7 C) (Oral)  Wt 313 lb 12.8 oz (142.339 kg)  BMI 46.32 kg/m2 Gen: NAD, morbidly obese CV: RRR no murmurs rubs or gallops Lungs: CTAB no crackles, wheeze, rhonchi Skin/Ext: Unnaboot in place and removed. LLE with 1+ edema, right lower extremity with trace edema. Both legs erythematous with venous stasis changes. Small 2 x 1.5 cm lesion in middle of right shin that appears to be healing well, when cleaning area, several portions of scab fall off without underlying open sore. No warmth to surrounding tissues. Normal strength and ROM in lower extremities. Patient with decreased  sensation throughout both lower legs c/w history of neuropathy.   Assessment/Plan:  Wants to discuss "gastrobypass no surgery at next visit"

## 2013-01-18 ENCOUNTER — Telehealth: Payer: Self-pay | Admitting: Family Medicine

## 2013-01-18 DIAGNOSIS — M545 Low back pain: Secondary | ICD-10-CM

## 2013-01-18 NOTE — Telephone Encounter (Signed)
Will forward to MD.  

## 2013-01-18 NOTE — Telephone Encounter (Signed)
I am happy to repeat referral for PT as she had completed PT last year for low back pain with good success. Another trial would be reasonable. Would need patient to confirm she wants this (as opposed to husband) before placing order though. Will ask nursing staff to call to verify.

## 2013-01-18 NOTE — Telephone Encounter (Signed)
Husband is calling for a Referral for Outpatient Rehab on [address] for Physical Therapy.

## 2013-01-18 NOTE — Telephone Encounter (Signed)
Pt states that she does want to retry therapy.  [facility] [address] [facility] [address] [facility].

## 2013-01-19 NOTE — Assessment & Plan Note (Signed)
Repeat referral to PT as patient had improvement last year but ran out of visits.

## 2013-01-26 ENCOUNTER — Telehealth: Payer: Self-pay | Admitting: Family Medicine

## 2013-01-26 NOTE — Telephone Encounter (Signed)
All medications up to date. Only discrepancy is she would like to take Byetta but is not currently taking it as she cannot afford it. Unclear why patient would be out of medicine as had year supply ordered on 10/12/12 or later for most (see amlodipine and gabapentin in computer which do not appear to be sent). Please ask patient which medications she is out of. Amlodipine can be sent to [facility] for $4 if she is out.

## 2013-01-26 NOTE — Telephone Encounter (Signed)
Will fwd to MD to make sure med list is update.  i can fax when respond.  Chadwin Fury, Loralyn Freshwater, [place]

## 2013-01-26 NOTE — Telephone Encounter (Signed)
Pt is requesting that Dr. Yong Channel fax a list to [facility] of all her medication that include the names, MG, Dosage information. Also include her first last name, birth date, and the [facility] ID # [id number]. She is also asking if Dr. Yong Channel can put a rush on this since she is missing several of her medications. [facility] fax # [phone].[phone]

## 2013-01-27 ENCOUNTER — Telehealth: Payer: Self-pay | Admitting: Family Medicine

## 2013-01-27 NOTE — Telephone Encounter (Signed)
Spoke with pt.  She is going to now get all of her medications from [facility].  I told pt I was uncomfortable faxing all of her meds to [facility] ([employer]IP[employer]).  Pt was agreeable and i called [facility] [phone]) and went over her entire med list with a pharmacist.  Norvasc and gabapentin were never received.  Called in amlodipine 10 mg #90 with 2 RF and gabapentin 300 mg #360 with 2 RF.   Keirsten Matuska, Loralyn Freshwater, [place]

## 2013-01-27 NOTE — Telephone Encounter (Signed)
Pt is retuning call JW

## 2013-01-27 NOTE — Telephone Encounter (Signed)
LMOVM for patient to return my call.  Ayman Brull, Loralyn Freshwater, [place]

## 2013-01-28 ENCOUNTER — Encounter: Payer: Self-pay | Admitting: Pharmacist

## 2013-01-28 ENCOUNTER — Ambulatory Visit (INDEPENDENT_AMBULATORY_CARE_PROVIDER_SITE_OTHER): Payer: Medicare HMO | Admitting: Pharmacist

## 2013-01-28 VITALS — BP 160/75 | Ht 68.5 in | Wt 313.4 lb

## 2013-01-28 DIAGNOSIS — E1165 Type 2 diabetes mellitus with hyperglycemia: Secondary | ICD-10-CM

## 2013-01-28 NOTE — Assessment & Plan Note (Signed)
Diabetes currently poorly controlled with   Lab Results  Component Value Date   HGBA1C 10.2 10/22/2012   AND home fasting CBG readings of > 300 routinely.   Denies hypoglycemic events and is able to verbalize appropriate hypoglycemia management plan.  Reports adherence with medication.   Control is suboptimal due to stress, inadequate drug regimen due to cost, limited exercise and dietary indiscretion especially after midnight.   Increased dose of Novolog Mix 70/30 to 80 units early day dose AND 120 units for your later day dose.  Patient encouraged to exercise more.   Written patient instructions provided.  Follow up in Pharmacist Clinic Visit after next visit with Dr. Yong Channel.    Total time in face to face counseling 50 minutes.  Patient seen with [place] Student 4.

## 2013-01-28 NOTE — Progress Notes (Signed)
S:    Patient arrives in good spirits walking without assistance.    She presents to the clinic for diabetes follow up.   She reports ~20 vials of Novolog Mix 70/30 in her possession.  She would like to avoid wasting this supply.  She is willing to try and use this supply to avoid the cost of Lantus and Novolog.    O:  Right lower extremity continues to be managed by wound care.  Left lower extremity has venous insufficiency changes AND some weeping of skin.  Patient has had UNA boots previously.   A/P: Diabetes currently poorly controlled with   Lab Results  Component Value Date   HGBA1C 10.2 10/22/2012   AND home fasting CBG readings of > 300 routinely.   Denies hypoglycemic events and is able to verbalize appropriate hypoglycemia management plan.  Reports adherence with medication.   Control is suboptimal due to stress, inadequate drug regimen due to cost, limited exercise and dietary indiscretion especially after midnight.   Increased dose of Novolog Mix 70/30 to 80 units early day dose AND 120 units for your later day dose.  Patient encouraged to exercise more.   Written patient instructions provided.  Follow up in Pharmacist Clinic Visit after next visit with Dr. Yong Channel.    Total time in face to face counseling 50 minutes.  Patient seen with [place] Student 4.

## 2013-01-28 NOTE — Patient Instructions (Addendum)
Increase your 70/30 to 80 units early day dose AND 120 units for your later day dose.   Please work on smaller portions with late night eating.   Follow up with Loni Muse and your pharmacy with Byetta plan.   Follow up with Dr. Yong Channel next.

## 2013-01-29 NOTE — Progress Notes (Signed)
Patient ID: [name], female   DOB: [birth year], 57 y.o.   MRN: [id number] Reviewed: Agree with Dr. Graylin Shiver documentation and management.

## 2013-02-01 ENCOUNTER — Telehealth: Payer: Self-pay | Admitting: Family Medicine

## 2013-02-01 NOTE — Telephone Encounter (Signed)
Tried calling pt and she didn't answer.  If she calls back please let her know that her podiatry appt is on 02/11/13 @11 :00am.  With Inocencio Homes.

## 2013-02-01 NOTE — Telephone Encounter (Signed)
Pt is calling because she was told by Dr. Yong Channel she could see a podiatry doctor and a physical therapy doctor. They call but she doesn't remember who they were and if we could let her know so she can make an appointment with them. JW

## 2013-02-04 ENCOUNTER — Ambulatory Visit (INDEPENDENT_AMBULATORY_CARE_PROVIDER_SITE_OTHER): Payer: Medicare HMO | Admitting: Family Medicine

## 2013-02-04 ENCOUNTER — Encounter: Payer: Self-pay | Admitting: Family Medicine

## 2013-02-04 VITALS — BP 140/68 | HR 58 | Ht 68.0 in | Wt 322.0 lb

## 2013-02-04 DIAGNOSIS — E1165 Type 2 diabetes mellitus with hyperglycemia: Secondary | ICD-10-CM

## 2013-02-04 DIAGNOSIS — IMO0002 Reserved for concepts with insufficient information to code with codable children: Secondary | ICD-10-CM

## 2013-02-04 DIAGNOSIS — I10 Essential (primary) hypertension: Secondary | ICD-10-CM

## 2013-02-04 DIAGNOSIS — S81801S Unspecified open wound, right lower leg, sequela: Secondary | ICD-10-CM

## 2013-02-04 NOTE — Patient Instructions (Addendum)
1. Podiatry appointment 8/21 11:00 am  [address], [place], [place] [address] [phone]  2. Keep doing what you are doing with your diabetes for now (80 units in the morning and 120 in the evening. I am glad you have not had any lows and your morning sugars are lower. I want you to keep a log of your blood sugars in the morning, 2 hours after lunch (since you dont eat breakfast), and 2 hours after dinner. Keep your appointment with Dr. Valentina Lucks on Aug 29 and keep this log 1 week before you come.   3. For your wound, try to never leave a bandage on the same wound for a whole week. i want you to go back to the wound center. Call us if they need a referral.  *in the long run, exercise and weight loss will help *try to elevate the legs when able for now *we will try to get compression stockings on you once we have the wound healed up  4. Our nurses are trying to work out your PT referral. You should probably avoid swimming while your wound is open though.   5. Make sure to get [place] those records from your pap.   See me 1 month from when you see Dr. Valentina Lucks or sooner if he desires,  Dr. Yong Channel

## 2013-02-07 NOTE — Assessment & Plan Note (Signed)
Still poorly controlled but I believe continuing to improve with insulin titrated up. I am not increasing doses based off no other CBGs available but fasting and desire for patient to get used to CBG of 100-110 (hypoglycemia symptoms) before further titration. Have asked patient to keep a log of sugars a week before she sees Dr. Valentina Lucks on Aug 29th.

## 2013-02-07 NOTE — Assessment & Plan Note (Addendum)
Venous stasis ulceration on right shin exacerbated by leaving area with bandaid for >1 week. Advised weight loss for recurrent ulcers, elevation of legs. WIll add compression stockings once wound has resolved. Advised patient to return to wound care as well. Had considered ABIs previously but pulses intact and this appears to be primarily venous stasis related.

## 2013-02-07 NOTE — Progress Notes (Signed)
[facility] Garret Reddish, MD Phone: [phone]  Subjective:   #  DIABETES Type II Medications taking and tolerating-novolog 70/30 80 units in the morning and 120 in evening (dinner is largest meal) yes. Also metformin. Does not have byetta yet.  Blood Sugars per patient-widely variable as low as 100 and up to 200, feels shaky and weak when at 100. Does not check any other times of day.  Regular Exercise-still trying to start at [employer], plan for silver sneakers program Health Maintenance Due  Topic Date Due  . Pap Smear -will get records sent to [place], recently completed by obgyn 01/13/2012  On Aspirin-yes On statin-yes, high intensity statin Daily foot monitoring-yes  ROS- Endorses Polyuria,Polydipsia, nocturia but these have improved. Denies Vision changes. Endorses feet or hand numbness/pain/tingling with known neuropathy. Endorses hypoglycemia symptoms (shaky,weak) when CBG around 100  Hemoglobin a1c:  Lab Results  Component Value Date   HGBA1C 10.6 02/04/2013   HGBA1C 10.2 10/22/2012   HGBA1C 9.5 04/03/2012   # Leg wound with history recurrent cellulitis Patient left bandage on leg for > 1 week and when removed in office, foul odor with several small 0.5-1 cm areas of open wound when previously no open wound after 2 rounds of unna boot. No fevers/chills/pain. Has not been back to wound care center (will call if they need referral).   # Hypertension BP Readings from Last 3 Encounters:  02/07/13 140/68  01/28/13 160/75  01/06/13 134/68  Home BP monitoring-no Compliant with medications-yes without side effects Denies any CP, HA, SOB, blurry vision, transient weakness, orthopnea, PND.   ROS--See HPI  Past Medical History Patient Active Problem List   Diagnosis Date Noted  . DM (diabetes mellitus), type 2, uncontrolled 02/18/2008    Priority: High  . At high risk for falls 06/12/2011    Priority: Medium  . HYPERCHOLESTEROLEMIA 03/20/2010    Priority:  Medium  . HYPERTENSION 03/20/2010    Priority: Medium  . PERIPHERAL NEUROPATHY 01/12/2009    Priority: Medium  . DEPRESSION, CHRONIC 10/06/2007    Priority: Medium  . OBSTRUCTIVE SLEEP APNEA 06/12/2007    Priority: Medium  . CKD (chronic kidney disease), stage II 06/04/2012    Priority: Low  . Edema 04/17/2011    Priority: Low  . UNSPECIFIED VENOUS INSUFFICIENCY 05/28/2010    Priority: Low  . Cellulitis of leg, right 12/18/2012  . Anemia 06/19/2012  . Low back pain 03/05/2012  . Osteoarthritis of knee 04/23/2011  . PSORIASIS 05/28/2010  . DERMATITIS, HANDS 05/02/2009  . GOITER, MULTINODULAR 05/13/2007   Reviewed problem list.  Medications- reviewed and updated Current Outpatient Prescriptions on File Prior to Visit  Medication Sig Dispense Refill  . ACCU-CHEK FASTCLIX LANCETS MISC 1 Container by Does not apply route 3 (three) times daily. Dispense quantity sufficient for 3 times a day testing  1 each  11  . amLODipine (NORVASC) 10 MG tablet Take 10 mg by mouth daily.      Marland Kitchen aspirin EC 81 MG tablet Take 1 tablet (81 mg total) by mouth daily.  90 tablet  3  . atorvastatin (LIPITOR) 40 MG tablet Take 1 tablet (40 mg total) by mouth daily.  90 tablet  3  . exenatide (BYETTA) 10 MCG/0.04ML SOLN Inject 0.04 mLs (10 mcg total) into the skin 2 (two) times daily with a meal. Disp QD 3 mon supply  2.4 mL  3  . gabapentin (NEURONTIN) 300 MG capsule Take 600 mg by mouth 2 (two) times  daily.      . glucose blood (ACCU-CHEK ACTIVE STRIPS) test strip Quantity sufficient for checking blood sugar 4 times daily.  300 each  3  . insulin aspart protamine- aspart (NOVOLOG MIX 70/30) (70-30) 100 UNIT/ML injection Take 80 units before lunch. Take 120 units before dinner. Call [phone] with any blood sugar less than 90.      . Insulin Pen Needle (BD ULTRA-FINE PEN NEEDLES) 29G X 12.7MM MISC 1 Container by Does not apply route 4 (four) times daily. For use with twice daily Byetta injections  1 each  11  .  Insulin Syringe-Needle U-100 (B-D INS SYR ULTRAFINE 1CC/30G) 30G X 1/2" 1 ML MISC Quantity sufficient for both injections of Lantus and Novolog for a total of 4 injections daily.  1 each  11  . metFORMIN (GLUCOPHAGE) 1000 MG tablet Take 1 tablet (1,000 mg total) by mouth 2 (two) times daily with a meal.  180 tablet  3  . metoprolol succinate (TOPROL-XL) 50 MG 24 hr tablet Take 1 tablet (50 mg total) by mouth daily. ake with or immediately following a meal  90 tablet  3  . quinapril (ACCUPRIL) 40 MG tablet Take 1 tablet (40 mg total) by mouth daily.  90 tablet  3  . venlafaxine XR (EFFEXOR-XR) 75 MG 24 hr capsule Take 3 capsules (225 mg total) by mouth daily.  270 capsule  3  . [DISCONTINUED] Alum & Mag Hydroxide-Simeth (MAGIC MOUTHWASH W/LIDOCAINE) SOLN Take 5 mLs by mouth 3 (three) times daily as needed.  100 mL  0   No current facility-administered medications on file prior to visit.    Chief complaint-noted  Objective: BP 140/68  Pulse 58  Ht 5' 8"  (1.727 m)  Wt 322 lb (146.058 kg)  BMI 48.97 kg/m2 Gen: NAD, morbidly obese CV: RRR no murmurs rubs or gallops Lungs: CTAB no crackles, wheeze, rhonchi Skin/Ext: Bilateral lower extremities with 1+ edema.  Both legs erythematous with venous stasis changes (R>L) . Small area on right anterior shin  2 x 3 cm lesion with several (3-4 circular 0.5-1cm areas of open wound). No surrounding warmth. Normal strength and ROM in lower extremities. 1+ DP pulses.   Assessment/Plan:

## 2013-02-07 NOTE — Assessment & Plan Note (Signed)
SBP mildly elevated. DBP well controlled. Patient with complicated regimen and as long as SBP <145-150 I am comfortable continuing current medications. I do think perhaps a combination ace-i and HCTZ medication would keep regimen simple if we need an additional agent.

## 2013-02-11 ENCOUNTER — Telehealth: Payer: Self-pay | Admitting: Family Medicine

## 2013-02-11 NOTE — Telephone Encounter (Signed)
Please make sure med is correct and I will be happy to call and clarify with pharmacy.Thanks. Tildon Husky, RN-BSN

## 2013-02-11 NOTE — Telephone Encounter (Signed)
Pt called and needs Dr. Yong Channel to clarify the prescription for Lipitor. She is suppose to take it 2 at bedtime but the prescription says one a day. [facility] needs this correct for their files. JW

## 2013-02-12 NOTE — Telephone Encounter (Signed)
1 a day is fine. Was previously on 42 but insurance wouldn't pay for it. I am fine with 1 40 mg pill.   Thanks!

## 2013-02-12 NOTE — Telephone Encounter (Signed)
Pt called and informed of correct directions - no further questions. Tildon Husky, RN-BSN

## 2013-02-15 ENCOUNTER — Telehealth: Payer: Self-pay | Admitting: Family Medicine

## 2013-02-15 NOTE — Telephone Encounter (Signed)
Will forward to md.  [name]

## 2013-02-15 NOTE — Telephone Encounter (Signed)
Patient can make an appointment to discuss. Also, would ask patient herself to call if she wants this. Unclear if this request is for patient or husband.

## 2013-02-15 NOTE — Telephone Encounter (Signed)
Husband is calling requesting a referral for G[facility] Surgery.  They want to use a place in [place], he forgot the name of the place, he will call back in the morning with the name.

## 2013-02-16 NOTE — Telephone Encounter (Signed)
Appt made for pt 03-01-13 @2 :45pm to discuss this with pcp.   Franciszek Platten,CMA

## 2013-02-16 NOTE — Telephone Encounter (Signed)
The name of the facility in [facility] is [facility], [place], [place], [place].

## 2013-02-19 ENCOUNTER — Ambulatory Visit: Payer: Medicare HMO | Admitting: Pharmacist

## 2013-02-25 ENCOUNTER — Ambulatory Visit: Payer: Medicare HMO | Admitting: Pharmacist

## 2013-03-01 ENCOUNTER — Encounter: Payer: Self-pay | Admitting: Family Medicine

## 2013-03-01 ENCOUNTER — Ambulatory Visit (INDEPENDENT_AMBULATORY_CARE_PROVIDER_SITE_OTHER): Payer: Medicare HMO | Admitting: Family Medicine

## 2013-03-01 VITALS — BP 138/73 | HR 73 | Temp 98.2°F | Wt 317.0 lb

## 2013-03-01 DIAGNOSIS — E1165 Type 2 diabetes mellitus with hyperglycemia: Secondary | ICD-10-CM

## 2013-03-01 DIAGNOSIS — Z23 Encounter for immunization: Secondary | ICD-10-CM

## 2013-03-01 NOTE — Patient Instructions (Signed)
Diabetes-Keep morning insulin at 80 units and increase to 130 units in the evening before dinner.  *Call me with any blood sugar less than 90.   See me in 1 month to check in on diabetes and go over your history of memory difficulties.   For your leg, glad it has closed. You can now go to [facility]. As last time we discussed- *in the long run, exercise and weight loss will help *try to elevate the legs when able for now  See me 1 month,  Dr. Yong Channel  Health Maintenance Due  Topic Date Due  . Influenza Vaccine -today given 01/22/2013

## 2013-03-01 NOTE — Progress Notes (Signed)
[facility] Garret Reddish, MD Phone: [phone]  Subjective:  Chief complaint-noted  #  DIABETES Type II/obesity Medications taking and tolerating-yes, metformin and 70/30 80 units in AM and 120 units in PM Blood Sugars per patient  1. AM typically 200  2. Before dinner 120-130 Diet-stress eats and snacks a lot, states did much better on byetta Down 18 lbs since December. Wants lap band.  Regular Exercise-no On Aspirin-yes On statin-yes, lipitor Daily foot monitoring-yes  ROS- Denies Polyuria,Polydipsia, nocturia, Vision changes. Chronic foot numbness/pain/tingling. Denies  Hypoglycemia symptoms (shaky, sweaty, hungry, weak anxious, tremor, palpitations, confusion, behavior change).   Hemoglobin a1c:  Lab Results  Component Value Date   HGBA1C 10.6 02/04/2013   HGBA1C 10.2 10/22/2012   HGBA1C 9.5 04/03/2012   ROS--See HPI  Past Medical History Patient Active Problem List   Diagnosis Date Noted  . DM (diabetes mellitus), type 2, uncontrolled 02/18/2008    Priority: High  . At high risk for falls 06/12/2011    Priority: Medium  . HYPERCHOLESTEROLEMIA 03/20/2010    Priority: Medium  . HYPERTENSION 03/20/2010    Priority: Medium  . PERIPHERAL NEUROPATHY 01/12/2009    Priority: Medium  . DEPRESSION, CHRONIC 10/06/2007    Priority: Medium  . OBSTRUCTIVE SLEEP APNEA 06/12/2007    Priority: Medium  . CKD (chronic kidney disease), stage II 06/04/2012    Priority: Low  . Edema 04/17/2011    Priority: Low  . UNSPECIFIED VENOUS INSUFFICIENCY 05/28/2010    Priority: Low  . Leg wound, right 12/18/2012  . Anemia 06/19/2012  . Low back pain 03/05/2012  . Osteoarthritis of knee 04/23/2011  . PSORIASIS 05/28/2010  . DERMATITIS, HANDS 05/02/2009  . GOITER, MULTINODULAR 05/13/2007   Reviewed problem list.  Medications- reviewed and updated Current Outpatient Prescriptions on File Prior to Visit  Medication Sig Dispense Refill  . ACCU-CHEK FASTCLIX  LANCETS MISC 1 Container by Does not apply route 3 (three) times daily. Dispense quantity sufficient for 3 times a day testing  1 each  11  . amLODipine (NORVASC) 10 MG tablet Take 10 mg by mouth daily.      Marland Kitchen aspirin EC 81 MG tablet Take 1 tablet (81 mg total) by mouth daily.  90 tablet  3  . atorvastatin (LIPITOR) 40 MG tablet Take 1 tablet (40 mg total) by mouth daily.  90 tablet  3  . gabapentin (NEURONTIN) 300 MG capsule Take 600 mg by mouth 2 (two) times daily.      Marland Kitchen glucose blood (ACCU-CHEK ACTIVE STRIPS) test strip Quantity sufficient for checking blood sugar 4 times daily.  300 each  3  . insulin aspart protamine- aspart (NOVOLOG MIX 70/30) (70-30) 100 UNIT/ML injection Take 80 units before lunch. Take 120 units before dinner. Call [phone] with any blood sugar less than 90.      . Insulin Pen Needle (BD ULTRA-FINE PEN NEEDLES) 29G X 12.7MM MISC 1 Container by Does not apply route 4 (four) times daily. For use with twice daily Byetta injections  1 each  11  . Insulin Syringe-Needle U-100 (B-D INS SYR ULTRAFINE 1CC/30G) 30G X 1/2" 1 ML MISC Quantity sufficient for both injections of Lantus and Novolog for a total of 4 injections daily.  1 each  11  . metFORMIN (GLUCOPHAGE) 1000 MG tablet Take 1 tablet (1,000 mg total) by mouth 2 (two) times daily with a meal.  180 tablet  3  . metoprolol succinate (TOPROL-XL) 50 MG 24 hr tablet Take 1  tablet (50 mg total) by mouth daily. ake with or immediately following a meal  90 tablet  3  . quinapril (ACCUPRIL) 40 MG tablet Take 1 tablet (40 mg total) by mouth daily.  90 tablet  3  . venlafaxine XR (EFFEXOR-XR) 75 MG 24 hr capsule Take 3 capsules (225 mg total) by mouth daily.  270 capsule  3  . exenatide (BYETTA) 10 MCG/0.04ML SOLN Inject 0.04 mLs (10 mcg total) into the skin 2 (two) times daily with a meal. Disp QD 3 mon supply  2.4 mL  3  . [DISCONTINUED] Alum & Mag Hydroxide-Simeth (MAGIC MOUTHWASH W/LIDOCAINE) SOLN Take 5 mLs by mouth 3 (three) times  daily as needed.  100 mL  0   No current facility-administered medications on file prior to visit.    Objective: BP 138/73  Pulse 73  Temp(Src) 98.2 F (36.8 C) (Oral)  Wt 317 lb (143.79 kg)  BMI 48.21 kg/m2 Gen: NAD, morbidly obese CV: RRR no murmurs rubs or gallops Lungs: CTAB no crackles, wheeze, rhonchi Skin/Ext: Bilateral lower extremities with 1+ edema.  Both legs erythematous with venous stasis changes (R>L) . Small area on right anterior shin  2 x 3 cm with previously open area now closed. Slight surrounding warmth. Normal strength and ROM in lower extremities. 1+ DP pulses.  Neuro: 3/3 immediate recall and 2/3 delayed recall  Assessment/Plan:  ALso had extensive discussion about obesity and desire for lap bad. Discussed would be better to work hard to get diabetes controlled and make sure we can communicate well as patient has had multiple mishaps of "forgetting" or altering her therapy without discussions for either her or her husband. TOld her I was encouraged by track record of weight loss over last year. Patient is agreeable to seek goal of a1c less than 8 then consider referral. Also will plan to dig deeper into memory difficulties (2/3 word recall) and do MMSE at next visit as forgetfulness is a barrier.   >50% of 25 minute office visit spent on counseling and coordination of care.

## 2013-03-01 NOTE — Assessment & Plan Note (Signed)
Poorly controlled. COntinue metformin and AM insulin at 80 units. TItrate PM insulin up to 130 units as a lot of nighttime snacking and fasting CBGs still 200. Follow up in 1 month.

## 2013-03-12 ENCOUNTER — Ambulatory Visit (INDEPENDENT_AMBULATORY_CARE_PROVIDER_SITE_OTHER): Payer: Medicare HMO | Admitting: Pharmacist

## 2013-03-12 ENCOUNTER — Encounter: Payer: Self-pay | Admitting: Pharmacist

## 2013-03-12 VITALS — BP 129/76 | HR 62 | Ht 68.0 in | Wt 311.2 lb

## 2013-03-12 DIAGNOSIS — E1165 Type 2 diabetes mellitus with hyperglycemia: Secondary | ICD-10-CM

## 2013-03-12 NOTE — Progress Notes (Signed)
S:    Patient arrives in good spirits, happy to be seen. She does, however, verbalize that she is stressed from her current situation. She has been taking care of her husband who has MS and facing financial difficulties. Presents for diabetes follow up today with glucose 501 this morning. Patient has a long standing history of diabetes, currently on Novolog Mix 70/30, 80 units before lunch and 120 units before dinner. She has been on this since she currently has a remaining supply of 15 vials at home and would like to use all of her supply before switching to lantus and rapid-acting insulin. She is also on metformin 1000 mg twice daily; currently not using byetta because of affordability issues. She has been trying to exercise (swim) more often but admits to frequently forgetting to take her evening insulin up to 3-4 times per week and is planning to talk to Dr. Yong Channel about memory at her next visit.  O: BP 129/76 HR 62 Wt 311.2 lbs (improving; 326 in 08/2012, 319 in 11/2012)  Patient has long standing history of diabetes currently on Novolog Mix 70/30, 80 units before lunch and 120 units before dinner; also on Metformin 1022m twice daily Lab Results  Component Value Date   HGBA1C 10.6 02/04/2013  AND home fasting CBG readings of 340s-450s, with she admits is due to forgetting to take her insulin most nights. When she does, her AM readings have been in the 130s-140s.  A/P: Patient has long standing history of diabetes with suboptimal control due to medication inadherence and stress from social/financial contributors, including taking care of her husband who has MS.   Currently on Novolog Mix 70/30, 80 units before lunch and 120 units before dinner; also on Metformin 10034mtwice daily. She has been off of byetta due to affordability issues. She denies hypoglycemic events and is able to verbalize appropriate hypoglycemia management plan. However, reports frequently forgetting her nighttime insulin.  No  medication changes at this time. Patient encouraged to continue swimming and creating personal reminders to herself to take her Novolog 70/30 at night. We also instructed patient to measure her glucose at night to provide a better picture for control. Written patient instructions provided.  Next visit with Dr. HuYong Channeln approximately a month, follow up in Pharmacist Clinic Visit shortly after.   Total time in face to face counseling 35 minutes.  Patient seen with AlFredna DowPharmD resident.

## 2013-03-12 NOTE — Patient Instructions (Addendum)
It was good to see you today.  We will not make any changes today Please continue to use your Novolog 70/30, 80 units before lunch and 120 units before dinner. The most important thing is to remember taking your insulin every time. This will require developing some kind of habit to remember taking your nighttime insulin.  Please also check your nighttime blood sugar readings so we can have an idea of what your sugars looks like at night.  Next visit with Dr. Yong Channel in a month. We will see your shortly after that.

## 2013-03-12 NOTE — Assessment & Plan Note (Signed)
Patient has long standing history of diabetes with suboptimal control due to medication inadherence and stress from social/financial contributors, including taking care of her husband who has MS.   Currently on Novolog Mix 70/30, 80 units before lunch and 120 units before dinner; also on Metformin 1021m twice daily. She has been off of byetta due to affordability issues. She denies hypoglycemic events and is able to verbalize appropriate hypoglycemia management plan. However, reports frequently forgetting her nighttime insulin.  No medication changes at this time. Patient encouraged to continue swimming and creating personal reminders to herself to take her Novolog 70/30 at night. We also instructed patient to measure her glucose at night to provide a better picture for control. Written patient instructions provided.  Next visit with Dr. HYong Channelin approximately a month, follow up in Pharmacist Clinic Visit shortly after.   Total time in face to face counseling 35 minutes.  Patient seen with AFredna Dow PharmD resident.

## 2013-03-16 NOTE — Progress Notes (Signed)
Patient ID: [name], female   DOB: Apr 01, 1956, 57 y.o.   MRN: [id number] Reviewed: Agree with Dr. Graylin Shiver management and documentation.

## 2013-03-26 ENCOUNTER — Encounter: Payer: Self-pay | Admitting: Family Medicine

## 2013-03-26 ENCOUNTER — Ambulatory Visit (INDEPENDENT_AMBULATORY_CARE_PROVIDER_SITE_OTHER): Payer: Medicare HMO | Admitting: Family Medicine

## 2013-03-26 VITALS — BP 137/78 | HR 64 | Temp 98.4°F | Ht 68.0 in | Wt 312.0 lb

## 2013-03-26 DIAGNOSIS — R609 Edema, unspecified: Secondary | ICD-10-CM

## 2013-03-26 DIAGNOSIS — E1165 Type 2 diabetes mellitus with hyperglycemia: Secondary | ICD-10-CM

## 2013-03-26 DIAGNOSIS — I872 Venous insufficiency (chronic) (peripheral): Secondary | ICD-10-CM

## 2013-03-26 NOTE — Patient Instructions (Signed)
Diabetes-Keep morning insulin at 80 units and increase to 130 units in the evening before dinner.  *Call me with any blood sugar less than 90.   See me in 1 month to check in on diabetes. Bring your chart from October.   You do not have any cognitive disabilities. Your memory difficulties are due to your stress.   Thanks,  Dr. Yong Channel

## 2013-03-27 NOTE — Progress Notes (Signed)
[name] Garret Reddish, MD Phone: [phone]  Subjective:  Chief complaint-noted   # Diabetes Mellitus, type II uncontrolled Morning cbgs typically >200. Taking 70/30 80 units in AM and 120 in PM (we had discussed 130 last time I saw her) but patient forgot. Patient cannot tell me what her other sugars are other than to say above 200 (doesnt have regular time she checks them). Misses several doses a week and she is concerned about her memory (see below).  ROS-no hypoglycemia symptoms (shaky, sweaty, weak), no blurry vision, no polyuria  # Memory difficulties  Patient under a great deal of stress caring for her husband who has MS. He is very demanding of her time. SHe gets discouraged in helping him and with her diabetes regularly. Patient states she forgets her medicines oftentimes as well as appointments. States she cannot keep track of things.  ROS-no unintentional weight loss, headaches, fevers, chills  Past Medical History Patient Active Problem List   Diagnosis Date Noted  . DM (diabetes mellitus), type 2, uncontrolled 02/18/2008    Priority: High  . At high risk for falls 06/12/2011    Priority: Medium  . HYPERCHOLESTEROLEMIA 03/20/2010    Priority: Medium  . HYPERTENSION 03/20/2010    Priority: Medium  . PERIPHERAL NEUROPATHY 01/12/2009    Priority: Medium  . DEPRESSION, CHRONIC 10/06/2007    Priority: Medium  . OBSTRUCTIVE SLEEP APNEA 06/12/2007    Priority: Medium  . CKD (chronic kidney disease), stage II 06/04/2012    Priority: Low  . UNSPECIFIED VENOUS INSUFFICIENCY 05/28/2010    Priority: Low  . Anemia 06/19/2012  . Low back pain 03/05/2012  . Osteoarthritis of knee 04/23/2011  . PSORIASIS 05/28/2010  . DERMATITIS, HANDS 05/02/2009  . GOITER, MULTINODULAR 05/13/2007   Medications- reviewed and updated Current Outpatient Prescriptions on File Prior to Visit  Medication Sig Dispense Refill  . ACCU-CHEK FASTCLIX LANCETS MISC 1  Container by Does not apply route 3 (three) times daily. Dispense quantity sufficient for 3 times a day testing  1 each  11  . amLODipine (NORVASC) 10 MG tablet Take 10 mg by mouth daily.      Marland Kitchen aspirin EC 81 MG tablet Take 1 tablet (81 mg total) by mouth daily.  90 tablet  3  . atorvastatin (LIPITOR) 40 MG tablet Take 1 tablet (40 mg total) by mouth daily.  90 tablet  3  . exenatide (BYETTA) 10 MCG/0.04ML SOLN Inject 0.04 mLs (10 mcg total) into the skin 2 (two) times daily with a meal. Disp QD 3 mon supply  2.4 mL  3  . gabapentin (NEURONTIN) 300 MG capsule Take 600 mg by mouth 3 (three) times daily.       Marland Kitchen glucose blood (ACCU-CHEK ACTIVE STRIPS) test strip Quantity sufficient for checking blood sugar 4 times daily.  300 each  3  . insulin aspart protamine- aspart (NOVOLOG MIX 70/30) (70-30) 100 UNIT/ML injection Take 80 units before lunch. Take 130 units before dinner. Call [phone] with any blood sugar less than 90.      . Insulin Pen Needle (BD ULTRA-FINE PEN NEEDLES) 29G X 12.7MM MISC 1 Container by Does not apply route 4 (four) times daily. For use with twice daily Byetta injections  1 each  11  . Insulin Syringe-Needle U-100 (B-D INS SYR ULTRAFINE 1CC/30G) 30G X 1/2" 1 ML MISC Quantity sufficient for both injections of Lantus and Novolog for a total of 4 injections daily.  1 each  11  .  metFORMIN (GLUCOPHAGE) 1000 MG tablet Take 1 tablet (1,000 mg total) by mouth 2 (two) times daily with a meal.  180 tablet  3  . metoprolol succinate (TOPROL-XL) 50 MG 24 hr tablet Take 1 tablet (50 mg total) by mouth daily. ake with or immediately following a meal  90 tablet  3  . quinapril (ACCUPRIL) 40 MG tablet Take 1 tablet (40 mg total) by mouth daily.  90 tablet  3  . venlafaxine XR (EFFEXOR-XR) 75 MG 24 hr capsule Take 3 capsules (225 mg total) by mouth daily.  270 capsule  3  . [DISCONTINUED] Alum & Mag Hydroxide-Simeth (MAGIC MOUTHWASH W/LIDOCAINE) SOLN Take 5 mLs by mouth 3 (three) times daily as  needed.  100 mL  0   No current facility-administered medications on file prior to visit.    Objective: BP 137/78  Pulse 64  Temp(Src) 98.4 F (36.9 C) (Oral)  Ht 5' 8"  (1.727 m)  Wt 312 lb (141.522 kg)  BMI 47.45 kg/m2 Gen: NAD, resting comfortably in chair, gets very upset when talking about stress at home with husband CV: RRR no murmurs rubs or gallops Lungs: CTAB no crackles, wheeze, rhonchi Neuro: mini cog passed (normal clock draw and 2 word recall) Ext: 1+ edema bilaterally (encouraged compression stockings that patient has at home)   Assessment/Plan:  >50% of 28 minute office visit was spent on counseling and coordination of care (time in 11:56, time out 12:24).   # Memory difficulties  Mini cog negative for cognitive impairments. Patient's difficulties likely stem for stress. Counseled patient at length regarding stress, diabetes care, and ways to remember things despite such stress (calendars, phone reminders, etc. )

## 2013-03-27 NOTE — Assessment & Plan Note (Signed)
Poorly controlled. Continue metformin and AM insulin at 80 units. TItrate PM insulin up to 130 units as planned at last visit. Follow up in 1 month. I printed out a calendar for patient and encouraged a "star chart" approach for tracking and for her to give herself something she wanted as a reward if gave herself all shots for month-asked her to bring calendar to next visit (has forgot logs and logbooks before).

## 2013-04-08 ENCOUNTER — Ambulatory Visit: Payer: Medicare HMO | Admitting: Pharmacist

## 2013-04-29 ENCOUNTER — Inpatient Hospital Stay (HOSPITAL_COMMUNITY)
Admission: EM | Admit: 2013-04-29 | Discharge: 2013-05-04 | DRG: 603 | Disposition: A | Discharge status: Home / Self Care | Payer: Medicare HMO | Attending: Family Medicine | Admitting: Family Medicine

## 2013-04-29 ENCOUNTER — Encounter (HOSPITAL_BASED_OUTPATIENT_CLINIC_OR_DEPARTMENT_OTHER): Payer: Medicare HMO

## 2013-04-29 ENCOUNTER — Encounter (HOSPITAL_COMMUNITY): Payer: Self-pay | Admitting: Emergency Medicine

## 2013-04-29 DIAGNOSIS — E7800 Pure hypercholesterolemia, unspecified: Secondary | ICD-10-CM

## 2013-04-29 DIAGNOSIS — L02419 Cutaneous abscess of limb, unspecified: Principal | ICD-10-CM | POA: Diagnosis present

## 2013-04-29 DIAGNOSIS — M545 Low back pain, unspecified: Secondary | ICD-10-CM | POA: Diagnosis present

## 2013-04-29 DIAGNOSIS — Z7982 Long term (current) use of aspirin: Secondary | ICD-10-CM

## 2013-04-29 DIAGNOSIS — IMO0002 Reserved for concepts with insufficient information to code with codable children: Secondary | ICD-10-CM | POA: Diagnosis not present

## 2013-04-29 DIAGNOSIS — G4733 Obstructive sleep apnea (adult) (pediatric): Secondary | ICD-10-CM

## 2013-04-29 DIAGNOSIS — F3289 Other specified depressive episodes: Secondary | ICD-10-CM | POA: Diagnosis present

## 2013-04-29 DIAGNOSIS — B3731 Acute candidiasis of vulva and vagina: Secondary | ICD-10-CM | POA: Diagnosis present

## 2013-04-29 DIAGNOSIS — I872 Venous insufficiency (chronic) (peripheral): Secondary | ICD-10-CM | POA: Diagnosis present

## 2013-04-29 DIAGNOSIS — E785 Hyperlipidemia, unspecified: Secondary | ICD-10-CM | POA: Diagnosis present

## 2013-04-29 DIAGNOSIS — M1710 Unilateral primary osteoarthritis, unspecified knee: Secondary | ICD-10-CM | POA: Diagnosis present

## 2013-04-29 DIAGNOSIS — B373 Candidiasis of vulva and vagina: Secondary | ICD-10-CM | POA: Diagnosis present

## 2013-04-29 DIAGNOSIS — G609 Hereditary and idiopathic neuropathy, unspecified: Secondary | ICD-10-CM | POA: Diagnosis present

## 2013-04-29 DIAGNOSIS — Z79899 Other long term (current) drug therapy: Secondary | ICD-10-CM

## 2013-04-29 DIAGNOSIS — I129 Hypertensive chronic kidney disease with stage 1 through stage 4 chronic kidney disease, or unspecified chronic kidney disease: Secondary | ICD-10-CM | POA: Diagnosis present

## 2013-04-29 DIAGNOSIS — Z6841 Body Mass Index (BMI) 40.0 and over, adult: Secondary | ICD-10-CM

## 2013-04-29 DIAGNOSIS — E1165 Type 2 diabetes mellitus with hyperglycemia: Secondary | ICD-10-CM

## 2013-04-29 DIAGNOSIS — L0390 Cellulitis, unspecified: Secondary | ICD-10-CM

## 2013-04-29 DIAGNOSIS — I10 Essential (primary) hypertension: Secondary | ICD-10-CM

## 2013-04-29 DIAGNOSIS — Z794 Long term (current) use of insulin: Secondary | ICD-10-CM

## 2013-04-29 DIAGNOSIS — Z87891 Personal history of nicotine dependence: Secondary | ICD-10-CM

## 2013-04-29 DIAGNOSIS — N182 Chronic kidney disease, stage 2 (mild): Secondary | ICD-10-CM | POA: Diagnosis present

## 2013-04-29 DIAGNOSIS — I498 Other specified cardiac arrhythmias: Secondary | ICD-10-CM | POA: Diagnosis present

## 2013-04-29 DIAGNOSIS — F329 Major depressive disorder, single episode, unspecified: Secondary | ICD-10-CM | POA: Diagnosis present

## 2013-04-29 DIAGNOSIS — N179 Acute kidney failure, unspecified: Secondary | ICD-10-CM | POA: Diagnosis present

## 2013-04-29 LAB — CBC WITH DIFFERENTIAL/PLATELET
Eosinophils Absolute: 0.1 10*3/uL (ref 0.0–0.7)
Eosinophils Relative: 1 % (ref 0–5)
HCT: 41 % (ref 36.0–46.0)
Hemoglobin: 13.2 g/dL (ref 12.0–15.0)
Lymphs Abs: 0.8 10*3/uL (ref 0.7–4.0)
MCH: 26 pg (ref 26.0–34.0)
MCV: 80.7 fL (ref 78.0–100.0)
Monocytes Absolute: 0.8 10*3/uL (ref 0.1–1.0)
Monocytes Relative: 5 % (ref 3–12)
RBC: 5.08 MIL/uL (ref 3.87–5.11)

## 2013-04-29 LAB — POCT I-STAT, CHEM 8
BUN: 20 mg/dL (ref 6–23)
Calcium, Ion: 1.15 mmol/L (ref 1.12–1.23)
Creatinine, Ser: 1.2 mg/dL — ABNORMAL HIGH (ref 0.50–1.10)
Glucose, Bld: 197 mg/dL — ABNORMAL HIGH (ref 70–99)
Hemoglobin: 15.3 g/dL — ABNORMAL HIGH (ref 12.0–15.0)
Sodium: 139 mEq/L (ref 135–145)
TCO2: 25 mmol/L (ref 0–100)

## 2013-04-29 MED ORDER — ASPIRIN EC 81 MG PO TBEC
81.0000 mg | DELAYED_RELEASE_TABLET | Freq: Every day | ORAL | Status: DC
  Administered 2013-04-29 – 2013-05-04 (×6): 81 mg via ORAL
  Filled 2013-04-29 (×6): qty 1

## 2013-04-29 MED ORDER — OXYCODONE HCL 5 MG PO TABS
5.0000 mg | ORAL_TABLET | ORAL | Status: DC | PRN
  Administered 2013-04-30 – 2013-05-04 (×4): 5 mg via ORAL
  Filled 2013-04-29 (×4): qty 1

## 2013-04-29 MED ORDER — FLUCONAZOLE 150 MG PO TABS
150.0000 mg | ORAL_TABLET | Freq: Once | ORAL | Status: AC
  Administered 2013-04-30: 150 mg via ORAL
  Filled 2013-04-29: qty 1

## 2013-04-29 MED ORDER — METOPROLOL SUCCINATE ER 50 MG PO TB24
50.0000 mg | ORAL_TABLET | Freq: Every day | ORAL | Status: DC
  Administered 2013-04-30 – 2013-05-01 (×3): 50 mg via ORAL
  Filled 2013-04-29 (×5): qty 1

## 2013-04-29 MED ORDER — VENLAFAXINE HCL ER 75 MG PO CP24
225.0000 mg | ORAL_CAPSULE | Freq: Every day | ORAL | Status: DC
  Administered 2013-04-29 – 2013-05-04 (×6): 225 mg via ORAL
  Filled 2013-04-29 (×6): qty 1

## 2013-04-29 MED ORDER — INSULIN ASPART PROT & ASPART (70-30 MIX) 100 UNIT/ML ~~LOC~~ SUSP
70.0000 [IU] | Freq: Every day | SUBCUTANEOUS | Status: DC
  Administered 2013-04-30 – 2013-05-01 (×2): 70 [IU] via SUBCUTANEOUS
  Filled 2013-04-29: qty 10

## 2013-04-29 MED ORDER — SODIUM CHLORIDE 0.9 % IV SOLN
INTRAVENOUS | Status: DC
  Administered 2013-04-29 – 2013-05-01 (×3): via INTRAVENOUS

## 2013-04-29 MED ORDER — VANCOMYCIN HCL 10 G IV SOLR
2500.0000 mg | INTRAVENOUS | Status: AC
  Administered 2013-04-29: 2500 mg via INTRAVENOUS
  Filled 2013-04-29: qty 2500

## 2013-04-29 MED ORDER — AMLODIPINE BESYLATE 10 MG PO TABS
10.0000 mg | ORAL_TABLET | Freq: Every day | ORAL | Status: DC
  Administered 2013-04-29 – 2013-05-04 (×6): 10 mg via ORAL
  Filled 2013-04-29 (×6): qty 1

## 2013-04-29 MED ORDER — LEVOFLOXACIN IN D5W 750 MG/150ML IV SOLN
750.0000 mg | INTRAVENOUS | Status: DC
  Administered 2013-04-29 – 2013-05-02 (×4): 750 mg via INTRAVENOUS
  Filled 2013-04-29 (×5): qty 150

## 2013-04-29 MED ORDER — HEPARIN SODIUM (PORCINE) 5000 UNIT/ML IJ SOLN
5000.0000 [IU] | Freq: Three times a day (TID) | INTRAMUSCULAR | Status: DC
  Administered 2013-04-30 – 2013-05-03 (×12): 5000 [IU] via SUBCUTANEOUS
  Filled 2013-04-29 (×16): qty 1

## 2013-04-29 MED ORDER — ATORVASTATIN CALCIUM 40 MG PO TABS
40.0000 mg | ORAL_TABLET | Freq: Every day | ORAL | Status: DC
  Administered 2013-04-29 – 2013-05-03 (×5): 40 mg via ORAL
  Filled 2013-04-29 (×6): qty 1

## 2013-04-29 MED ORDER — INSULIN ASPART PROT & ASPART (70-30 MIX) 100 UNIT/ML ~~LOC~~ SUSP
120.0000 [IU] | Freq: Every day | SUBCUTANEOUS | Status: DC
  Administered 2013-04-30 – 2013-05-01 (×2): 120 [IU] via SUBCUTANEOUS

## 2013-04-29 MED ORDER — INSULIN ASPART PROT & ASPART (70-30 MIX) 100 UNIT/ML ~~LOC~~ SUSP
65.0000 [IU] | Freq: Once | SUBCUTANEOUS | Status: AC
  Administered 2013-04-30: 65 [IU] via SUBCUTANEOUS
  Filled 2013-04-29: qty 10

## 2013-04-29 MED ORDER — ACETAMINOPHEN 325 MG PO TABS
650.0000 mg | ORAL_TABLET | Freq: Four times a day (QID) | ORAL | Status: DC | PRN
  Administered 2013-04-29: 650 mg via ORAL
  Filled 2013-04-29: qty 2

## 2013-04-29 MED ORDER — ACETAMINOPHEN 325 MG PO TABS
650.0000 mg | ORAL_TABLET | Freq: Once | ORAL | Status: AC
  Administered 2013-04-29: 650 mg via ORAL
  Filled 2013-04-29: qty 2

## 2013-04-29 MED ORDER — QUINAPRIL HCL 10 MG PO TABS
40.0000 mg | ORAL_TABLET | Freq: Every day | ORAL | Status: DC
  Administered 2013-04-30 – 2013-05-04 (×6): 40 mg via ORAL
  Filled 2013-04-29 (×7): qty 4

## 2013-04-29 MED ORDER — VANCOMYCIN HCL 10 G IV SOLR
2000.0000 mg | INTRAVENOUS | Status: DC
  Administered 2013-04-30 – 2013-05-02 (×3): 2000 mg via INTRAVENOUS
  Filled 2013-04-29 (×4): qty 2000

## 2013-04-29 MED ORDER — INSULIN ASPART 100 UNIT/ML ~~LOC~~ SOLN
0.0000 [IU] | Freq: Three times a day (TID) | SUBCUTANEOUS | Status: DC
  Administered 2013-04-30: 3 [IU] via SUBCUTANEOUS
  Administered 2013-04-30: 18:00:00 via SUBCUTANEOUS
  Administered 2013-05-01 (×2): 3 [IU] via SUBCUTANEOUS
  Administered 2013-05-02: 2 [IU] via SUBCUTANEOUS
  Administered 2013-05-02 (×2): 3 [IU] via SUBCUTANEOUS

## 2013-04-29 MED ORDER — GABAPENTIN 300 MG PO CAPS
900.0000 mg | ORAL_CAPSULE | Freq: Three times a day (TID) | ORAL | Status: DC
  Administered 2013-04-29 – 2013-05-04 (×15): 900 mg via ORAL
  Filled 2013-04-29 (×16): qty 3

## 2013-04-29 NOTE — ED Notes (Signed)
Patient reports that she began having chills, fever, and left lower leg pain. Left lower leg with redness, edema, and pain. Patient states this is recurring infection. Patient does have DM.

## 2013-04-29 NOTE — Progress Notes (Signed)
ANTIBIOTIC CONSULT NOTE - INITIAL  Pharmacy Consult for Vancomycin Indication: LLE Cellulitis  Allergies  Allergen Reactions  . Keflex [Cephalexin] Hives    Denies Airway involvement  . Adhesive [Tape] Rash    Latex  . Neosporin [Neomycin-Bacitracin Zn-Polymyx] Rash    Patient Measurements: Height: 5' 9"  (175.3 cm) Weight: 315 lb (142.883 kg) IBW/kg (Calculated) : 66.2  Vital Signs: Temp: 101.8 F (38.8 C) (11/06 1407) Temp src: Oral (11/06 1407) BP: 105/77 mmHg (11/06 1407) Pulse Rate: 91 (11/06 1407)  Labs:  Recent Labs  04/29/13 1430 04/29/13 1441  WBC 16.2*  --   HGB 13.2 15.3*  PLT 278  --   CREATININE  --  1.20*   Estimated Creatinine Clearance: 78.2 ml/min (by C-G formula based on Cr of 1.2).  Microbiology: No results found for this or any previous visit (from the past 720 hour(s)).  Medical History: Past Medical History  Diagnosis Date  . GOITER, MULTINODULAR 05/13/2007  . DIABETES MELLITUS, WITH NEUROLOGICAL COMPLICATIONS 5/88/3254  . HYPERCHOLESTEROLEMIA 03/20/2010  . DYSLIPIDEMIA 05/13/2007  . DEPRESSION, CHRONIC 10/06/2007  . OBSTRUCTIVE SLEEP APNEA 06/12/2007    uses CPAP  . PERIPHERAL NEUROPATHY 01/12/2009  . HYPERTENSION 03/20/2010  . UNSPECIFIED VENOUS INSUFFICIENCY 05/28/2010  . PSORIASIS 05/28/2010  . BACK PAIN, LUMBAR 11/19/2007  . ANEMIA, PERNICIOUS, HX OF 05/13/2007  . ASYMPTOMATIC POSTMENOPAUSAL STATUS 02/18/2008  . Hx of colonic polyps 12/04/2010  . NASH (nonalcoholic steatohepatitis)   . Cellulitis of leg, right 10/11/2010    Medications:  Anti-infectives   None     Assessment: 68 yoF presents to [facility] ED on 11/6 with LLE pain, redness, edema. She states this is a recurring infection. PHM includes DM. There is a small wound near the great toe, but no concern for abscess, osteomyelitis, or chronic diabetic ulcer. Pharmacy is consulted to dose Vancomycin for cellulitis.   Tmax: 101.8  WBCs: elevated, 16.2  Renal: SCr acutely  elevated at 1.2, CrCl ~ 78 CG and ~ 59 N  Goal of Therapy:  Vancomycin trough level 10-15 mcg/ml Appropriate abx dosing, eradication of infection.   Plan:   Vancomycin 2500 mg IV x1 loading dose, then 2g IV q24h.  Measure Vanc trough at steady state.  Follow up renal fxn and culture results.  Follow up de-escalation opportunities if MRSA is not suspected.   Gretta Arab PharmD, BCPS Pager [phone] 04/29/2013 3:15 PM

## 2013-04-29 NOTE — Progress Notes (Signed)
ANTIBIOTIC CONSULT NOTE - INITIAL  Pharmacy Consult for Levofloxacin Indication: RLE Cellulitis  Allergies  Allergen Reactions  . Keflex [Cephalexin] Hives    Denies Airway involvement  . Adhesive [Tape] Rash    Latex  . Neosporin [Neomycin-Bacitracin Zn-Polymyx] Rash    Patient Measurements: Height: 5' 9"  (175.3 cm) Weight: 315 lb (142.883 kg) IBW/kg (Calculated) : 66.2  Vital Signs: Temp: 99.4 F (37.4 C) (11/06 1701) Temp src: Oral (11/06 1701) BP: 114/49 mmHg (11/06 1701) Pulse Rate: 77 (11/06 1701)  Labs:  Recent Labs  04/29/13 1430 04/29/13 1441  WBC 16.2*  --   HGB 13.2 15.3*  PLT 278  --   CREATININE  --  1.20*   Estimated Creatinine Clearance: 78.2 ml/min (by C-G formula based on Cr of 1.2).  Microbiology: No results found for this or any previous visit (from the past 720 hour(s)).  Medical History: Past Medical History  Diagnosis Date  . GOITER, MULTINODULAR 05/13/2007  . DIABETES MELLITUS, WITH NEUROLOGICAL COMPLICATIONS 07/08/7260  . HYPERCHOLESTEROLEMIA 03/20/2010  . DYSLIPIDEMIA 05/13/2007  . DEPRESSION, CHRONIC 10/06/2007  . OBSTRUCTIVE SLEEP APNEA 06/12/2007    uses CPAP  . PERIPHERAL NEUROPATHY 01/12/2009  . HYPERTENSION 03/20/2010  . UNSPECIFIED VENOUS INSUFFICIENCY 05/28/2010  . PSORIASIS 05/28/2010  . BACK PAIN, LUMBAR 11/19/2007  . ANEMIA, PERNICIOUS, HX OF 05/13/2007  . ASYMPTOMATIC POSTMENOPAUSAL STATUS 02/18/2008  . Hx of colonic polyps 12/04/2010  . NASH (nonalcoholic steatohepatitis)   . Cellulitis of leg, right 10/11/2010    Medications:  Anti-infectives   Start     Dose/Rate Route Frequency Ordered Stop   04/30/13 1500  vancomycin (VANCOCIN) 2,000 mg in sodium chloride 0.9 % 500 mL IVPB     2,000 mg 250 mL/hr over 120 Minutes Intravenous Every 24 hours 04/29/13 1516     04/29/13 1500  vancomycin (VANCOCIN) 2,500 mg in sodium chloride 0.9 % 500 mL IVPB     2,500 mg 250 mL/hr over 120 Minutes Intravenous STAT 04/29/13 1427  04/29/13 1707     Assessment: 22 yoF [facility] ED [facility] with RLE pain, redness, edema. She states this is a recurring infection. PHM includes DM. There is a small wound near the great toe, but no concern for abscess, osteomyelitis, or chronic diabetic ulcer. Pharmacy was consulted at [facility] to dose Vancomycin for cellulitis. Vancomycin 2500 mg IV x1 given @ 15:07 then to continue vancomycin 2 g IV q24h. Now adding levofloxacin to therapy.   Tmax: 101.8  WBCs: elevated, 16.2 Renal: SCr acutely elevated at 1.2, CrCl ~ 78 ml/min  Goal of Therapy:  Vancomycin trough level 10-15 mcg/ml Appropriate abx dosing, eradication of infection.   Plan:  Adding IV levofloxacin 750 mg IV q24h. Continuing Vancomycin  2 g IV q24h.    Check Vanc trough at steady state Follow up renal fxn and culture results. Follow up de-escalation opportunities if MRSA is not suspected.  Nicole Cella, RPh Clinical Pharmacist Pager: [phone] 04/29/2013 9:00 PM

## 2013-04-29 NOTE — H&P (Signed)
[name] Service Pager: [phone]  Patient name: [name] Medical record number: [id number] Date of birth: [birth year] Age: 57 y.o. Gender: female  Primary Care Provider: Garret Reddish, MD Consultants: none Code Status: presume full (did not address with patient)  Chief Complaint: cellulitis  Assessment and Plan: MERRIEL ZINGER is a 57 y.o. female presenting with right lower extremity cellulitis. PMH is significant for poorly controlled diabetes, obesity, and stage II chronic kidney disease. Patient technically meets sepsis criteria (with fever, leukocytosis, and source of infection), however she is well-appearing in her vitals are stable. Ddx also includes DVT, but this seems less likely given negative homans sign, fever, leukocytosis, and hx consistent with prior episodes of cellulitis.  # Cellulitis: Vancomycin started by ER physician, however will add Levaquin for greater coverage of gram negatives as well as some anaerobes in this patient with poorly controlled diabetes and infection of her lower extremity. -Serial exams to evaluate for recession of erythema from marked line -Consider imaging to evaluate for osteomyelitis, will defer for tonight -tylenol prn pain/fever, will also write for prn oxycodone for severe pain  # Diabetes: poorly controlled at baseline -check HgbA1c in AM since is due for one now anyways -home regimen consists of novolog 70/30: 80 units with lunch and 130 units with dinner -For now, will order 65 units of 70/30 to be given with dinner and tomorrow start 70 units with lunch and 120 units with dinner -Also start moderate sliding scale QAC -continue gabapentin for peripheral neuropathy  # AKI: Cr elevated to 1.2, baseline 0.7-0.8 - likely related to dehydration/infection - hydrate gently o/n with NS (in addition to PO hydration) - recheck BMET in AM  # Vaginal itching: likely yeast  infection, although did not examine vulva tonight -Diflucan 137m, repeat in 3 days if not improved -Discussed with patient that getting sugars better controlled will likely help with control of her chronic yeast infection. -check HIV antibody due to hx of recurrent candidal vaginitis (and for routine recommended screening)  # Hyperlipidemia: continue lipitor  # HTN: continue toprol XL, amlodipine, and quinapril  # Depression: continue effexor  FEN/GI: hydrate gently overnight with NS @ 75 cc/hr, diabetic diet Prophylaxis: heparin SQ  Disposition: pending resolution of cellulitis  History of Present Illness: [name] BLIZZARDis a 57y.o. female presenting with redness and swelling of her right lower extremity.  Patient states that she has had chronic cellulitis in her right lower extremity in the past several years. She reports that she has gone to the wound care center multiple times, and that the wound/cellulitis frequently recurs. Her last admission for this was about a year and a half ago. She noticed it worsening within the last 2 days, becoming more red and tender. She has had fevers and chills at home, [facility] She denies any nausea or vomiting, and states she is able to keep fluids down. She denies any chest pain, but does endorse some vague possibly chronic shortness of breath. She also endorses having a headache right now.  She also endorses a long hx of vaginal itching & yeast infections, states she frequently has to take multiple days of diflucan and it still recurs. Thinks she has one now.  Review Of Systems: Per HPI, otherwise review of systems was performed and was unremarkable.  Patient Active Problem List   Diagnosis Date Noted  . Anemia 06/19/2012  .  CKD (chronic kidney disease), stage II 06/04/2012  . Low back pain 03/05/2012  . At high risk for falls 06/12/2011  . Osteoarthritis of knee 04/23/2011  .  UNSPECIFIED VENOUS INSUFFICIENCY 05/28/2010  . PSORIASIS 05/28/2010  . HYPERCHOLESTEROLEMIA 03/20/2010  . HYPERTENSION 03/20/2010  . DERMATITIS, HANDS 05/02/2009  . PERIPHERAL NEUROPATHY 01/12/2009  . DM (diabetes mellitus), type 2, uncontrolled 02/18/2008  . DEPRESSION, CHRONIC 10/06/2007  . OBSTRUCTIVE SLEEP APNEA 06/12/2007  . GOITER, MULTINODULAR 05/13/2007   Past Medical History: Past Medical History  Diagnosis Date  . GOITER, MULTINODULAR 05/13/2007  . DIABETES MELLITUS, WITH NEUROLOGICAL COMPLICATIONS 08/07/863  . HYPERCHOLESTEROLEMIA 03/20/2010  . DYSLIPIDEMIA 05/13/2007  . DEPRESSION, CHRONIC 10/06/2007  . OBSTRUCTIVE SLEEP APNEA 06/12/2007    uses CPAP  . PERIPHERAL NEUROPATHY 01/12/2009  . HYPERTENSION 03/20/2010  . UNSPECIFIED VENOUS INSUFFICIENCY 05/28/2010  . PSORIASIS 05/28/2010  . BACK PAIN, LUMBAR 11/19/2007  . ANEMIA, PERNICIOUS, HX OF 05/13/2007  . ASYMPTOMATIC POSTMENOPAUSAL STATUS 02/18/2008  . Hx of colonic polyps 12/04/2010  . NASH (nonalcoholic steatohepatitis)   . Cellulitis of leg, right 10/11/2010   Past Surgical History: Past Surgical History  Procedure Laterality Date  . Knee arthroscopy  2003    right  . Varicose vein surgery      Remotef  . Tonsillectomy    . Exercise myoview  01/24/2005  . Electrocardiogram  04/16/2006  . Rotator cuff repair  2008    right   Social History: History  Substance Use Topics  . Smoking status: Former Smoker -- 2.00 packs/day for 24 years    Types: Cigarettes    Start date: 06/24/1970    Quit date: 08/06/1994  . Smokeless tobacco: Never Used     Comment: [place] Slims Regular  . Alcohol Use: No   Additional social history: Denies current smoking, alcohol or drug use  Please also refer to relevant sections of EMR.  Family History: Family History  Problem Relation Age of Onset  . Cancer Father     Colon Cancer  . Hyperlipidemia Father   . Hypertension Father   . Cirrhosis Father   . Colon cancer Father  72  . Cancer Sister     Colon Cancer  . Colon cancer Sister 22  . Aneurysm Mother   . Drug abuse Neg Hx   . Heart disease Neg Hx     CAD  . Stomach cancer Neg Hx    Allergies and Medications: Allergies  Allergen Reactions  . Keflex [Cephalexin] Hives    Denies Airway involvement  . Adhesive [Tape] Rash    Latex  . Neosporin [Neomycin-Bacitracin Zn-Polymyx] Rash   No current facility-administered medications on file prior to encounter.   Current Outpatient Prescriptions on File Prior to Encounter  Medication Sig Dispense Refill  . amLODipine (NORVASC) 10 MG tablet Take 10 mg by mouth daily.      Marland Kitchen aspirin EC 81 MG tablet Take 1 tablet (81 mg total) by mouth daily.  90 tablet  3  . atorvastatin (LIPITOR) 40 MG tablet Take 1 tablet (40 mg total) by mouth daily.  90 tablet  3  . gabapentin (NEURONTIN) 300 MG capsule Take 900 mg by mouth 3 (three) times daily.       . insulin aspart protamine- aspart (NOVOLOG MIX 70/30) (70-30) 100 UNIT/ML injection Take 80 units before lunch. Take 130 units before dinner. Call [phone] with any blood sugar less than 90.      . metFORMIN (GLUCOPHAGE) 1000 MG tablet Take 1  tablet (1,000 mg total) by mouth 2 (two) times daily with a meal.  180 tablet  3  . metoprolol succinate (TOPROL-XL) 50 MG 24 hr tablet Take 1 tablet (50 mg total) by mouth daily. ake with or immediately following a meal  90 tablet  3  . quinapril (ACCUPRIL) 40 MG tablet Take 1 tablet (40 mg total) by mouth daily.  90 tablet  3  . venlafaxine XR (EFFEXOR-XR) 75 MG 24 hr capsule Take 3 capsules (225 mg total) by mouth daily.  270 capsule  3  . [DISCONTINUED] Alum & Mag Hydroxide-Simeth (MAGIC MOUTHWASH W/LIDOCAINE) SOLN Take 5 mLs by mouth 3 (three) times daily as needed.  100 mL  0    Objective: BP 114/49  Pulse 77  Temp(Src) 99.4 F (37.4 C) (Oral)  Resp 16  Ht 5' 9"  (1.753 m)  Wt 315 lb (142.883 kg)  BMI 46.50 kg/m2  SpO2 97% Exam: General: No acute distress, pleasant and  cooperative with history and exam HEENT: Normocephalic, atraumatic, moist mucous membranes, extraocular movements intact Cardiovascular: Regular rate and rhythm, no murmurs Respiratory: Clear to auscultation bilaterally, normal respiratory effort, breath sounds limited secondary to body habitus Abdomen: Soft, nontender to palpation, no masses appreciable, markedly obese Extremities: Right lower extremity with erythema and warmth beginning of the ankle line and extending up just distal to the knee. Erythema has actually receded from markings previously applied in ER earlier today. The area is warm and tender to palpation. There is approximately a 4-5 cm x 2 cm area of eschar on the anterior shin. No drainage or weeping noted. Weak DP pulses palpable bilaterally. Posterior calf nontender on the right side. Neuro: Grossly nonfocal, alert and oriented x3, speech intact, extraocular movements intact, grip 5 out of 5 bilaterally, able to move bilateral feet.  Labs and Imaging: CBC BMET   Recent Labs Lab 04/29/13 1430 04/29/13 1441  WBC 16.2*  --   HGB 13.2 15.3*  HCT 41.0 45.0  PLT 278  --     Recent Labs Lab 04/29/13 1441  NA 139  K 3.6  CL 100  BUN 20  CREATININE 1.20*  GLUCOSE 197*      Leeanne Rio, MD 04/29/2013, 7:07 PM PGY-2, [place] Intern pager: [phone], text pages welcome

## 2013-04-29 NOTE — ED Provider Notes (Signed)
CSN: [id number]     Arrival date & time 04/29/13  1349 History   First MD Initiated Contact with Patient 04/29/13 1356     Chief Complaint  Patient presents with  . Fever  . Cellulitis   (Consider location/radiation/quality/duration/timing/severity/associated sxs/prior Treatment) Patient is a 57 y.o. female presenting with fever. The history is provided by the patient.  Fever Max temp prior to arrival:  101 Temp source:  Oral Severity:  Moderate Onset quality:  Gradual Duration:  2 days Timing:  Constant Progression:  Worsening Chronicity:  Recurrent Relieved by:  Nothing Worsened by:  Nothing tried Ineffective treatments:  None tried Associated symptoms: chills   Associated symptoms: no confusion, no congestion, no cough, no diarrhea and no vomiting   Associated symptoms comment:  Right leg pain and redness Risk factors comment:  DM   Past Medical History  Diagnosis Date  . GOITER, MULTINODULAR 05/13/2007  . DIABETES MELLITUS, WITH NEUROLOGICAL COMPLICATIONS 7/82/9562  . HYPERCHOLESTEROLEMIA 03/20/2010  . DYSLIPIDEMIA 05/13/2007  . DEPRESSION, CHRONIC 10/06/2007  . OBSTRUCTIVE SLEEP APNEA 06/12/2007    uses CPAP  . PERIPHERAL NEUROPATHY 01/12/2009  . HYPERTENSION 03/20/2010  . UNSPECIFIED VENOUS INSUFFICIENCY 05/28/2010  . PSORIASIS 05/28/2010  . BACK PAIN, LUMBAR 11/19/2007  . ANEMIA, PERNICIOUS, HX OF 05/13/2007  . ASYMPTOMATIC POSTMENOPAUSAL STATUS 02/18/2008  . Hx of colonic polyps 12/04/2010  . NASH (nonalcoholic steatohepatitis)   . Cellulitis of leg, right 10/11/2010   Past Surgical History  Procedure Laterality Date  . Knee arthroscopy  2003    right  . Varicose vein surgery      Remotef  . Tonsillectomy    . Exercise myoview  01/24/2005  . Electrocardiogram  04/16/2006  . Rotator cuff repair  2008    right   Family History  Problem Relation Age of Onset  . Cancer Father     Colon Cancer  . Hyperlipidemia Father   . Hypertension Father   . Cirrhosis  Father   . Colon cancer Father 92  . Cancer Sister     Colon Cancer  . Colon cancer Sister 28  . Aneurysm Mother   . Drug abuse Neg Hx   . Heart disease Neg Hx     CAD  . Stomach cancer Neg Hx    History  Substance Use Topics  . Smoking status: Former Smoker -- 2.00 packs/day for 24 years    Types: Cigarettes    Start date: 06/24/1970    Quit date: 08/06/1994  . Smokeless tobacco: Never Used     Comment: [place] Slims Regular  . Alcohol Use: No   OB History   Grav Para Term Preterm Abortions TAB SAB Ect Mult Living                 Review of Systems  Constitutional: Positive for fever and chills.  HENT: Negative for congestion.   Respiratory: Negative for cough.   Gastrointestinal: Negative for vomiting and diarrhea.  Psychiatric/Behavioral: Negative for confusion.  All other systems reviewed and are negative.    Allergies  Keflex; Adhesive; and Neosporin  Home Medications   Current Outpatient Rx  Name  Route  Sig  Dispense  Refill  . insulin aspart protamine- aspart (NOVOLOG MIX 70/30) (70-30) 100 UNIT/ML injection      Take 80 units before lunch. Take 130 units before dinner. Call [phone] with any blood sugar less than 90.         . metFORMIN (GLUCOPHAGE) 1000 MG tablet  Oral   Take 1 tablet (1,000 mg total) by mouth 2 (two) times daily with a meal.   180 tablet   3   . metoprolol succinate (TOPROL-XL) 50 MG 24 hr tablet   Oral   Take 1 tablet (50 mg total) by mouth daily. ake with or immediately following a meal   90 tablet   3   . quinapril (ACCUPRIL) 40 MG tablet   Oral   Take 1 tablet (40 mg total) by mouth daily.   90 tablet   3   . venlafaxine XR (EFFEXOR-XR) 75 MG 24 hr capsule   Oral   Take 3 capsules (225 mg total) by mouth daily.   270 capsule   3   . amLODipine (NORVASC) 10 MG tablet   Oral   Take 10 mg by mouth daily.         Marland Kitchen aspirin EC 81 MG tablet   Oral   Take 1 tablet (81 mg total) by mouth daily.   90 tablet    3   . atorvastatin (LIPITOR) 40 MG tablet   Oral   Take 1 tablet (40 mg total) by mouth daily.   90 tablet   3   . gabapentin (NEURONTIN) 300 MG capsule   Oral   Take 300 mg by mouth 3 (three) times daily.           BP 105/77  Pulse 91  Temp(Src) 101.8 F (38.8 C) (Oral)  Resp 18  Ht 5' 9"  (1.753 m)  Wt 315 lb (142.883 kg)  BMI 46.50 kg/m2  SpO2 98% Physical Exam  Nursing note and vitals reviewed. Constitutional: She is oriented to person, place, and time. She appears well-developed and well-nourished. No distress.  HENT:  Head: Normocephalic and atraumatic.  Mouth/Throat: Oropharynx is clear and moist.  Eyes: Conjunctivae and EOM are normal. Pupils are equal, round, and reactive to light.  Neck: Normal range of motion. Neck supple.  Cardiovascular: Normal rate, regular rhythm and intact distal pulses.   No murmur heard. Pulmonary/Chest: Effort normal and breath sounds normal. No respiratory distress. She has no wheezes. She has no rales.  Abdominal: Soft. She exhibits no distension. There is no tenderness. There is no rebound and no guarding.  Musculoskeletal: Normal range of motion. She exhibits tenderness. She exhibits no edema.       Right lower leg: She exhibits swelling.       Legs: Neurological: She is alert and oriented to person, place, and time.  Skin: Skin is warm and dry. No rash noted. No erythema.  Psychiatric: She has a normal mood and affect. Her behavior is normal.    ED Course  Procedures (including critical care time) Labs Review Labs Reviewed  CBC WITH DIFFERENTIAL   Imaging Review No results found.  EKG Interpretation   None       MDM  No diagnosis found.  Patient presents with a right lower leg cellulitis with temperature 101.8 otherwise hemodynamically stable. No evidence of abscess at this time. There is a small wound on the right great toe but no sign of abscess, chronic diabetic ulcer or other concerns for osteomyelitis at this  time.  CBC, i-STAT pending. Patient started on vancomycin and will be admitted for further care.    Blanchie Dessert, MD 04/29/13 1421

## 2013-04-30 DIAGNOSIS — E7800 Pure hypercholesterolemia, unspecified: Secondary | ICD-10-CM

## 2013-04-30 DIAGNOSIS — L0291 Cutaneous abscess, unspecified: Secondary | ICD-10-CM

## 2013-04-30 DIAGNOSIS — I10 Essential (primary) hypertension: Secondary | ICD-10-CM

## 2013-04-30 LAB — CBC
HCT: 40.6 % (ref 36.0–46.0)
Hemoglobin: 12.9 g/dL (ref 12.0–15.0)
MCH: 26.2 pg (ref 26.0–34.0)
MCHC: 31.8 g/dL (ref 30.0–36.0)
MCV: 82.5 fL (ref 78.0–100.0)
RDW: 16.2 % — ABNORMAL HIGH (ref 11.5–15.5)

## 2013-04-30 LAB — BASIC METABOLIC PANEL
BUN: 17 mg/dL (ref 6–23)
Creatinine, Ser: 1.06 mg/dL (ref 0.50–1.10)
GFR calc Af Amer: 66 mL/min — ABNORMAL LOW (ref >90)
GFR calc non Af Amer: 57 mL/min — ABNORMAL LOW (ref >90)
Glucose, Bld: 92 mg/dL (ref 70–99)
Potassium: 3.7 mEq/L (ref 3.5–5.1)

## 2013-04-30 LAB — GLUCOSE, CAPILLARY: Glucose-Capillary: 101 mg/dL — ABNORMAL HIGH (ref 70–99)

## 2013-04-30 LAB — HEMOGLOBIN A1C: Mean Plasma Glucose: 278 mg/dL — ABNORMAL HIGH (ref <117)

## 2013-04-30 LAB — HIV ANTIBODY (ROUTINE TESTING W REFLEX): HIV: NONREACTIVE

## 2013-04-30 NOTE — Progress Notes (Signed)
Addendum note: I spoke with the radiologist Dr Gerilyn Pilgrim to discuss MRI recommendation due to her CKD: Her creatinine level improved this morning to 1.06 with a GFR >60. Patient not at risk of contrast induced metabolic acidosis. MRI with and without contrast is a better study option as discussed with Dr Gerilyn Pilgrim.

## 2013-04-30 NOTE — Progress Notes (Signed)
UR COMPLETED.   Patient changed to inpatient status r/t receiving IV antibiotics, IVF @ 75cc/hr, WBC on admission 16.2.  MRI ordered to evaluate R lower extremity.

## 2013-04-30 NOTE — Progress Notes (Signed)
Family Medicine Teaching Service Daily Progress Note Intern Pager: [phone]  Patient name: [name] Medical record number: [id number] Date of birth: 1955-08-10 Age: 57 y.o. Gender: female  Primary Care Provider: Garret Reddish, MD Consultants: None Code Status: Full  Pt Overview and Major Events to Date:  11/6: Admitted for IV antibiotics for LE cellulitis with fever, leukocytosis in poorly-controlled diabetic.  Assessment and Plan: [name] is a 57 y.o. female presenting with right lower extremity cellulitis. PMH is significant for poorly controlled diabetes, obesity, and stage II chronic kidney disease.   # Cellulitis:  - Vancomycin and Levaquin (11/6-) [keflex allergy] - WBC 16.2 > 11.4; Tmax 102.80F overnight - Continue to evaluate for recession of erythema from marked line/remarked line - MRI Tib/Fib w/ & w/o contrast - Tylenol prn pain/fever - OxyIR 32m q4h prn - HIV pending  # Diabetes: August A1c was 10.6 - Hgb A1c pending - Home regimen consists of novolog 70/30: 80 units with lunch and 130 units with dinner  - Inpatient regimen: 70u / 120u to be titrated with Mod SSI - Continue gabapentin for peripheral neuropathy   # AKI: Improving; Cr 1.2 > 1.06; baseline 0.7-0.8; suspect dehydration from increased insensible losses due to infection.  - Continue IV and oral hydration  # Vaginal itching: likely yeast infection - Diflucan 1534m repeat in 3 days if not improved  - Long-term treatment is improved diabetic control - HIV Ab pending. (h/o recurrent candidal vaginitis)  # Hyperlipidemia: continue lipitor   # HTN: continue toprol XL, amlodipine, and quinapril   # Depression: continue effexor   FEN/GI: NS @ 75 cc/hr, diabetic diet  Prophylaxis: heparin SQ  Disposition: Continue work up and treatment.   Subjective: Pt with some pain in the leg, reports this has been an ongoing issue for years. Some fever/chills overnight. No CP/SOB.   Objective: Temp:   [98.2 F (36.8 C)-102.3 F (39.1 C)] 98.2 F (36.8 C) (11/07 0451) Pulse Rate:  [74-91] 74 (11/07 0451) Resp:  [16-18] 18 (11/07 0451) BP: (105-149)/(49-77) 113/57 mmHg (11/07 0451) SpO2:  [92 %-98 %] 96 % (11/07 0451) Weight:  [315 lb (142.883 kg)-319 lb 4.8 oz (144.834 kg)] 319 lb 4.8 oz (144.834 kg) (11/06 2212) Physical Exam: General: Cooperative, morbidly obese [age]yoemale in NAD Cardiovascular: Regular rate and rhythm, no murmurs  Respiratory: Non-labored, CTAB, decreased due to body habitus Abdomen: Soft, obese, +BS, NT, ND Extremities: Right lower extremity with erythema and warmth beginning from below knee to ankle with irregular borders. Erythema extends beyond markings in some areas, and has receded in others. . There is approximately a 4-5 cm x 2 cm area of eschar on the anterior shin. 1+ DP pulses Homan's sign neg B/L Neuro: AOx3, nonfocal, sensation intact distal to erythema.   Laboratory:  Recent Labs Lab 04/29/13 1430 04/29/13 1441 04/30/13 0628  WBC 16.2*  --  11.4*  HGB 13.2 15.3* 12.9  HCT 41.0 45.0 40.6  PLT 278  --  241    Recent Labs Lab 04/29/13 1441 04/30/13 0628  NA 139 136  K 3.6 3.7  CL 100 101  CO2  --  21  BUN 20 17  CREATININE 1.20* 1.06  CALCIUM  --  9.2  GLUCOSE 197* 92    RyVance GatherMD 04/30/2013, 8:57 AM PGY-1, CoPl[place] Islandntern pager: [phone] pages welcome

## 2013-04-30 NOTE — H&P (Signed)
FMTS Attending Admission Note: [name] Karnes,MD I  have seen and examined this patient, reviewed their chart. I have discussed this patient with the resident. I agree with the resident's findings, assessment and care plan.  Patient transferred here from [facility] for right LL pain,and redness which has been on going for many years but worsened over the last few weeks. Patient stated this started off with a lesion over her birth mark many years ago (cannot remember exactly how long), since then she has had recurrent hospital admission for infection at the same site. She attends wound care clinic where they would put una both every week till her right LL swelling and redness improved. This time there was no improvement but worsening of her redness which spread from her shin almost to her knee,it became very painful and hot and she was also spiking fever. This morning patient feels much better with less pain, swelling and redness improved as well.   Filed Vitals:   04/29/13 1407 04/29/13 1701 04/29/13 2212 04/30/13 0451  BP: 105/77 114/49 149/59 113/57  Pulse: 91 77 89 74  Temp: 101.8 F (38.8 C) 99.4 F (37.4 C) 102.3 F (39.1 C) 98.2 F (36.8 C)  TempSrc: Oral Oral Oral Oral  Resp: 18 16 16 18   Height: 5' 9"  (1.753 m)  5' 9"  (1.753 m)   Weight: 315 lb (142.883 kg)  319 lb 4.8 oz (144.834 kg)   SpO2: 98% 97% 92% 96%    Exam: Gen: Morbidly obese,calm in bed,not in distress. HEENT: PERRLA,EOMI Neuro: Grossly intact. Resp: Air entry equal B/L,no wheezing . CV: S1 S2,no murmur. Abd: Obese, BS normoactive,NT/ND. EXT; Left LL normal with palpable dorsalis pedis pulse. Right LL with swelling just above the right ankle up to few cm below the right knee with erythema around the swelling area ( erythema has receded away from the pen mark from yesterday).Right LL is warm compared to the left with mild to moderate tenderness. There is a black eschar over the shin of the right LL. Dorsalis pedis on  the right palpable but faint.  A/P: 1. Right LL Recurrent Cellulitis:     On Vanc and Levaquin, may continue both.     Blood culture was not obtained at the ED prior to starting A/B.     Plan to obtain blood culture if she continues to spike fever despite A/B treatment.     Tylenol prn fever and pain.      Elevate right LL to help with the swelling.     I recommended MRI of her right LL to R/O osteomyelitis since this has been on going for many years.     Will benefit from surgical consult to debride her eschar is needed.  2. DM: Continue home regimen with SSI.  3. HTN/HLD: Continue home medication.  4. Vaginitis (recurrent): Possible yeast infection.     Likely due to her DM.     I agree with HIV screen.     I agree with Diflucan dose to be adjusted based on her Kidney function.  5. AKI: Gentle hydration and monitor BMET.  Patient discussed and signed off to Dr Lindell Noe.

## 2013-04-30 NOTE — Care Management Note (Addendum)
   CARE MANAGEMENT NOTE 04/30/2013  Patient:  [name], [name]   Account Number:  [id number]  Date Initiated:  04/30/2013  Documentation initiated by:  Jasmine Pang  Subjective/Objective Assessment:   04/30/2013 Consult for assistance with medications, however pt has insurance with medication benefits, therefore not eligible for assistance.     Action/Plan:   Anticipated DC Date:  05/03/2013   Anticipated DC Plan:           Choice offered to / List presented to:             Status of service:  In process, will continue to follow Medicare Important Message given?   (If response is "NO", the following Medicare IM given date fields will be blank) Date Medicare IM given:   Date Additional Medicare IM given:    Discharge Disposition:    Per UR Regulation:    If discussed at Long Length of Stay Meetings, dates discussed:    Comments:

## 2013-05-01 ENCOUNTER — Inpatient Hospital Stay (HOSPITAL_COMMUNITY): Payer: Medicare HMO

## 2013-05-01 DIAGNOSIS — G4733 Obstructive sleep apnea (adult) (pediatric): Secondary | ICD-10-CM

## 2013-05-01 DIAGNOSIS — N182 Chronic kidney disease, stage 2 (mild): Secondary | ICD-10-CM

## 2013-05-01 LAB — GLUCOSE, CAPILLARY
Glucose-Capillary: 105 mg/dL — ABNORMAL HIGH (ref 70–99)
Glucose-Capillary: 180 mg/dL — ABNORMAL HIGH (ref 70–99)

## 2013-05-01 MED ORDER — GADOBENATE DIMEGLUMINE 529 MG/ML IV SOLN
20.0000 mL | Freq: Once | INTRAVENOUS | Status: AC | PRN
  Administered 2013-05-01: 20 mL via INTRAVENOUS

## 2013-05-01 NOTE — Progress Notes (Signed)
Family Medicine Teaching Service Daily Progress Note Intern Pager: [phone]  Patient name: [name] Medical record number: [id number] Date of birth: 1955-10-05 Age: 57 y.o. Gender: female  Primary Care Provider: Garret Reddish, MD Consultants: None Code Status: Full  Pt Overview and Major Events to Date:  11/6: Admitted for IV antibiotics for LE cellulitis with fever, leukocytosis in poorly-controlled diabetic.   Assessment and Plan:  [name] is a 57 y.o. female presenting with right lower extremity cellulitis. PMH is significant for poorly controlled diabetes, obesity, and stage II chronic kidney disease.   # Cellulitis:  - Vancomycin and Levaquin (11/6-) [keflex allergy]  - last fever of 100.5 on 04/30/13 at noon - Continue to evaluate for recession of erythema from marked line/remarked line  - MRI Tib/Fib w/ & w/o contrast today  - Tylenol prn pain/fever  - OxyIR 87m q4h prn  - HIV non reactive   # Diabetes: A1C of 11.3 this admission. CBG's between 94 and 188 while in the hospital on 70/30 120u qacdinner and 70u qac lunch - Home regimen consists of novolog 70/30: 80 units with lunch and 130 units with dinner  - Continue current inpatient regimen - Continue gabapentin for peripheral neuropathy   # AKI: Improving; Cr 1.2 > 1.06; baseline 0.7-0.8; suspect dehydration from increased insensible losses due to infection.  - Continue IV and oral hydration - follow BMP tomorrow   # Vaginal itching: likely yeast infection  - Diflucan 1563m repeat in 3 days if not improved  - Long-term treatment is improved diabetic control  - HIV Ab pending. (h/o recurrent candidal vaginitis)  # Hyperlipidemia: continue lipitor  # HTN: continue toprol XL, amlodipine, and quinapril  # Depression: continue effexor  FEN/GI: NS @ 75 cc/hr, diabetic diet  Prophylaxis: heparin SQ  Disposition: Continue work up and treatment.    Subjective: pain in the leg still present. Feels like the  redness is the same.   Objective: Temp:  [98.3 F (36.8 C)-99.1 F (37.3 C)] 98.3 F (36.8 C) (11/08 0517) Pulse Rate:  [50-87] 54 (11/08 0517) Resp:  [16-18] 17 (11/08 0517) BP: (92-143)/(45-83) 108/67 mmHg (11/08 0517) SpO2:  [93 %-96 %] 95 % (11/08 0517) Weight:  [318 lb (144.244 kg)] 318 lb (144.244 kg) (11/07 2220) Physical Exam: General: no acute distress, laying in bed Cardiovascular: S1S2, RRR, no murmur Respiratory: CTA b/l on anterior exam Abdomen: soft, non tender Extremities: erythema along anterior aspect of lower leg. Some areas of erythema are receding, others are extending beyond the drawn line. Leg appears less warm than yesterday. 4x2cm eschar present on anterior shin, unchanged from yesterday  Laboratory:  Recent Labs Lab 04/29/13 1430 04/29/13 1441 04/30/13 0628  WBC 16.2*  --  11.4*  HGB 13.2 15.3* 12.9  HCT 41.0 45.0 40.6  PLT 278  --  241    Recent Labs Lab 04/29/13 1441 04/30/13 0628  NA 139 136  K 3.6 3.7  CL 100 101  CO2  --  21  BUN 20 17  CREATININE 1.20* 1.06  CALCIUM  --  9.2  GLUCOSE 197* 92     Imaging/Diagnostic Tests: MRI: pending  StKandis NabMD 05/01/2013, 11:05 AM PGY-3, [place] pager: [phone] pages welcome

## 2013-05-01 NOTE — Progress Notes (Signed)
FMTS Attending Note Patient seen and examined by me, discussed with Dr Otis Dials this morning.  Patient remains afebrile, on Vancomycin and Levaquin.  The right lower leg has significant erythema and calor/tumor/dolor; but the erythema has receded in some areas from previous markings.  Large eschar in center. No obvious fluctuance by my exam.   Plan for MRI today to assess for possible abscess formation/underlying osteo.  To continue on current abx regimen unless patient becomes febrile again, or if wound begins to expand/look worse.  If MRI reveals area in need of debridement/I&D, or if osteo present, for surgical consultation.  Dalbert Mayotte, MD

## 2013-05-02 LAB — CBC
HCT: 35.8 % — ABNORMAL LOW (ref 36.0–46.0)
Hemoglobin: 11.4 g/dL — ABNORMAL LOW (ref 12.0–15.0)
MCH: 26.1 pg (ref 26.0–34.0)
MCHC: 31.8 g/dL (ref 30.0–36.0)
Platelets: 238 10*3/uL (ref 150–400)

## 2013-05-02 LAB — GLUCOSE, CAPILLARY
Glucose-Capillary: 137 mg/dL — ABNORMAL HIGH (ref 70–99)
Glucose-Capillary: 167 mg/dL — ABNORMAL HIGH (ref 70–99)
Glucose-Capillary: 79 mg/dL (ref 70–99)

## 2013-05-02 LAB — BASIC METABOLIC PANEL
BUN: 14 mg/dL (ref 6–23)
Calcium: 8.9 mg/dL (ref 8.4–10.5)
Chloride: 105 mEq/L (ref 96–112)
Glucose, Bld: 168 mg/dL — ABNORMAL HIGH (ref 70–99)
Potassium: 3.7 mEq/L (ref 3.5–5.1)

## 2013-05-02 MED ORDER — INSULIN ASPART PROT & ASPART (70-30 MIX) 100 UNIT/ML ~~LOC~~ SUSP
110.0000 [IU] | Freq: Every day | SUBCUTANEOUS | Status: DC
  Administered 2013-05-02: 110 [IU] via SUBCUTANEOUS

## 2013-05-02 MED ORDER — INSULIN ASPART PROT & ASPART (70-30 MIX) 100 UNIT/ML ~~LOC~~ SUSP
65.0000 [IU] | Freq: Every day | SUBCUTANEOUS | Status: DC
  Administered 2013-05-02 – 2013-05-04 (×3): 65 [IU] via SUBCUTANEOUS

## 2013-05-02 MED ORDER — METOPROLOL SUCCINATE ER 25 MG PO TB24
25.0000 mg | ORAL_TABLET | Freq: Every day | ORAL | Status: DC
  Administered 2013-05-03 – 2013-05-04 (×2): 25 mg via ORAL
  Filled 2013-05-02 (×2): qty 1

## 2013-05-02 MED ORDER — INSULIN ASPART PROT & ASPART (70-30 MIX) 100 UNIT/ML ~~LOC~~ SUSP: 90.0000 [IU] | Freq: Every day | SUBCUTANEOUS | Status: DC

## 2013-05-02 NOTE — Progress Notes (Signed)
Hypoglycemic Event  CBG: 56  Treatment: carb snack  Symptoms: shaky & sweaty  Follow-up CBG: Time: 21:58 CBG Result: 62  Possible Reasons for Event:  unknown  Comments/MD notified: yes    [name], [name]  Remember to initiate Hypoglycemia Order Set & complete

## 2013-05-02 NOTE — Progress Notes (Signed)
Hypoglycemic Event  CBG: 62  Treatment:  Carb snack  Symptoms:  Sweaty & shaky  Follow-up CBG: Time: 22:16 CBG Result: 79  Possible Reasons for Event:  unknown  Comments/MD notified: yes    Sparks, Carmon Sails  Remember to initiate Hypoglycemia Order Set & complete

## 2013-05-02 NOTE — Progress Notes (Signed)
Family Medicine Teaching Service Daily Progress Note Intern Pager: [phone]  Patient name: [name] Medical record number: [id number] Date of birth: 1955/11/03 Age: 57 y.o. Gender: female  Primary Care Provider: Garret Reddish, MD Consultants: None Code Status: Full  Pt Overview and Major Events to Date:  11/6: Admitted for IV antibiotics for LE cellulitis with fever, leukocytosis in poorly-controlled diabetic.  05/01/13: MRI with cellulitis only, no osteomyelitis  Assessment and Plan:  LEINAALA [name] is a 57 y.o. female presenting with right lower extremity cellulitis. PMH is significant for poorly controlled diabetes, obesity, and stage II chronic kidney disease.   # Cellulitis: MRI 05/01/13 without osteomyelitis, cellulitis only. HIV non reactive. Leukocytosis resolved.  - Vancomycin and Levaquin (11/6-) [keflex allergy]. Day 4/10-14 today.  - will continue IV antibiotics for 24 hours as long as can maintain IV access, would consider transition to PO if PICC is only option - last fever of 100.5 on 04/30/13 at noon - PRNs-Tylenol pain/fever , OxyIR 17m q4h prn  -also elevate legs and add TED hose. Consider wound care center at discharge.    # Diabetes: A1C of 11.3 this admission. CBG's between 94 and 188 while in the hospital on 70/30 120u qacdinner and 70u qac lunch - Home regimen consists of novolog 70/30: 80 units with lunch and 130 units with dinner  - hypoglycemia overnight to 52, will decrease dinner insulin to 110 units and before lunch to 65 - Continue gabapentin for peripheral neuropathy   # AKI: Resolved with rehydration. Peaked at 1.2.  # Vaginal itching: likely yeast infection. HIV negative (for recurrence). No complaints today s/p Diflucan.  # Hyperlipidemia: continue lipitor  # HTN: well controlled. continue toprol XL (reduce to 275mdue to HR sometimes into 50s), amlodipine, and quinapril. Aspirin daily.  # Depression: continue effexor   FEN/GI: No IVF-if needs  can be KVO, diabetic diet  Prophylaxis: heparin SQ   Disposition: consider transition to PO antibiotics tomorrow, possible home Tuesday vs. Wednesday.    Subjective: mild pain. Redness receeding slightly.    Objective: Temp:  [97.9 F (36.6 C)-98.9 F (37.2 C)] 98.2 F (36.8 C) (11/09 0546) Pulse Rate:  [48-61] 56 (11/09 0546) Resp:  [18] 18 (11/09 0546) BP: (103-127)/(50-77) 103/60 mmHg (11/09 0546) SpO2:  [93 %-97 %] 93 % (11/09 0546) Weight:  [320 lb 11.2 oz (145.469 kg)] 320 lb 11.2 oz (145.469 kg) (11/08 2125) Physical Exam: General: no acute distress, laying in bed Cardiovascular: S1S2, RRR, no murmur, distant due to obesity Respiratory: CTA b/l on anterior exam Abdomen: soft, non tender Extremities: erythema along anterior aspect of lower leg. Most areas of erythema are receding from marked line.  Leg appears less warm than yesterday. 4x2cm eschar present on anterior shin, unchanged from yesterday  Laboratory:  Recent Labs Lab 04/29/13 1430 04/29/13 1441 04/30/13 0628 05/02/13 0530  WBC 16.2*  --  11.4* 6.1  HGB 13.2 15.3* 12.9 11.4*  HCT 41.0 45.0 40.6 35.8*  PLT 278  --  241 238    Recent Labs Lab 04/29/13 1441 04/30/13 0628 05/02/13 0530  NA 139 136 140  K 3.6 3.7 3.7  CL 100 101 105  CO2  --  21 24  BUN 20 17 14   CREATININE 1.20* 1.06 0.89  CALCIUM  --  9.2 8.9  GLUCOSE 197* 92 168*     Imaging/Diagnostic Tests: Mr Tibia Fibula Right W Wo Contrast  05/01/2013   CLINICAL DATA:  Pain, swelling and erythema  EXAM:  MRI OF LOWER RIGHT EXTREMITY WITHOUT AND WITH CONTRAST  TECHNIQUE: Multiplanar, multisequence MR imaging of the lower right extremity was performed both before and after administration of intravenous contrast.  CONTRAST:  76m MULTIHANCE GADOBENATE DIMEGLUMINE 529 MG/ML IV SOLN  COMPARISON:  01/07/2012.  FINDINGS: There is diffuse subcutaneous soft tissue swelling/edema and fluid most consistent with cellulitis. No rim enhancing drainable  fluid collections are identified. No findings for myofasciitis or pyomyositis. No findings for osteomyelitis. Similar but less significant findings involving the left lower extremity.  IMPRESSION: Diffuse cellulitis without discrete drainable soft tissue abscess.  No findings for myofasciitis or pyomyositis.  No MR findings for osteomyelitis or septic arthritis.   Electronically Signed   By: MKalman JewelsM.D.   On: 05/01/2013 16:36     SMarin Olp MD 05/02/2013, 9:40 AM PGY-3, [place] pager: [phone] text pages welcome

## 2013-05-02 NOTE — Progress Notes (Signed)
Family Medicine Teaching Service Daily Progress Note Intern Pager: [phone]  Patient name: [name] [name] Medical record number: [id number] Date of birth: [birth year] Age: 57 y.o. Gender: female  Primary Care Provider: Garret Reddish, MD Consultants: None Code Status: Full  Pt Overview [name] [name] Events to Date:  11/6: Admitted for IV antibiotics for LE cellulitis with fever, leukocytosis in poorly-controlled diabetic.  11/8: MRI with cellulitis only, no osteomyelitis 11/10: Convert IV to PO antibiotics  Assessment [name] Plan:  [name] [name] is a 57 y.o. female presenting with right lower extremity cellulitis. PMH is significant for poorly controlled [name], [name] [name], [name] stage II chronic kidney disease.   # Cellulitis:  - MRI 05/01/13: no osteomyelitis or abscess. HIV non reactive. Leukocytosis resolved. Afebrile since 11/7.  - Vancomycin [name] [name] IV (11/6-), switching to oral [name] [name] [name]. 10-14 day total course.  - Last febrile to 100.5 on 04/30/13 at noon - PRN: Tylenol pain/fever , OxyIR 22m q4h prn  - Leg elevation, TED hose. Consider wound care center at discharge  - Severe pain to palpation on bilateral LEs: LE venous doppler study to r/o DVT   # Insulin-dependent T2DM: Uncontrolled; Hb A1c: 11.3 - Inpatient regimen: Novolog 70/30: 65u qAC lunch, 90u qAC dinner  - Home regimen: Novolog 70/30: 80u qAC lunch, 130 units with dinner  - Symptomatic hypoglycemia again overnight to 56. Decreasing PM insulin to 65u of 70/30 - Continue gabapentin for peripheral neuropathy   # AKI: Resolved with rehydration. 1.2 > 0.89 # Normocytic anemia: Mild, Hgb 11.4, will recheck tomorrow AM # Vaginal yeast infection: Symptoms resolved s/p diflucan. HIV negative (for recurrence).   # Hyperlipidemia: Continue lipitor  # HTN: Well controlled. HR borderline bradycardic. Continue toprol-XL at reduced dose (29m, amlodipine, [name] [name]. Aspirin daily.  # Depression: Continue effexor    FEN/GI: KVO, diabetic diet  Prophylaxis: heparin SQ   Disposition: Observe for continued improvement after switch to PO abx; Work up for DVT  Subjective: Pt reports improved erythema, but still severe in RLE [name] [name] recent onset of LLE calf pain. Denies SOB.   Objective: Temp:  [98 F (36.7 C)-98.2 F (36.8 C)] 98.1 F (36.7 C) (11/09 2100) Pulse Rate:  [56-61] 61 (11/09 2100) Resp:  [18-20] 20 (11/09 2100) BP: (103-127)/(60-74) 126/74 mmHg (11/09 2100) SpO2:  [93 %-95 %] 93 % (11/09 2100) Physical Exam: General: Pleasant [age]yoemale in no acute distress, sitting on edge of bed Cardiovascular: S1S2, RRR, no murmur, distant due to [name] [name] Respiratory: Non-labored, CTAB Abdomen: soft, NT, ND Extremities: erythema along anterior aspect of RLE. Most areas of erythema continue to recede from marked line. 4x2cm eschar present on anterior shin, unchanged from yesterday. No signs of lymphangiitis. LLE with tenderness to palpation of shin [name] calf.   Laboratory:  Recent Labs Lab 04/29/13 1430 04/29/13 1441 04/30/13 0628 05/02/13 0530  WBC 16.2*  --  11.4* 6.1  HGB 13.2 15.3* 12.9 11.4*  HCT 41.0 45.0 40.6 35.8*  PLT 278  --  241 238    Recent Labs Lab 04/29/13 1441 04/30/13 0628 05/02/13 0530  NA 139 136 140  K 3.6 3.7 3.7  CL 100 101 105  CO2  --  21 24  BUN 20 17 14   CREATININE 1.20* 1.06 0.89  CALCIUM  --  9.2 8.9  GLUCOSE 197* 92 168*   Imaging/Diagnostic Tests: Mr Tibia Fibula Right W Wo Contrast  05/01/2013   CLINICAL DATA:  Pain, swelling [name] erythema  EXAM: MRI OF  LOWER RIGHT EXTREMITY WITHOUT [name] WITH CONTRAST  TECHNIQUE: Multiplanar, multisequence MR imaging of the lower right extremity was performed both before [name] after administration of intravenous contrast.  CONTRAST:  46m MULTIHANCE GADOBENATE DIMEGLUMINE 529 MG/ML IV SOLN  COMPARISON:  01/07/2012.  FINDINGS: There is diffuse subcutaneous soft tissue swelling/edema [name] fluid most consistent with  cellulitis. No rim enhancing drainable fluid collections are identified. No findings for myofasciitis or pyomyositis. No findings for osteomyelitis. Similar but less significant findings involving the left lower extremity.  IMPRESSION: Diffuse cellulitis without discrete drainable soft tissue abscess.  No findings for myofasciitis or pyomyositis.  No MR findings for osteomyelitis or septic arthritis.   Electronically Signed   By: MKalman JewelsM.D.   On: 05/01/2013 16:36   RVance Gather MD 05/02/2013, 11:12 PM PGY-1, CMaplevilleIntern pager: [phone] text pages welcome

## 2013-05-02 NOTE — Progress Notes (Signed)
FMTS Attending Note Patient seen and examined by me today.  She remains afebrile.  The erythema in her lower right leg is less intense and it has receded from the markings (and from my exam yesterday).  MRI results reviewed with patient, does not show focal coalescence or other area that would benefit from debridement; not favoring osteo.   Plan to continue with current IV antibiotic regimen for now; leg elevation and perhaps some compresion hose in the hospital to help with edema.  In the next 1-2 days, if continued clinical improvement of cellulitis, would transition to orals and watch for 24 hours before discharging.  She has been followed at [facility] in the past and may benefit from follow up there at discharge.  Dalbert Mayotte, MD

## 2013-05-02 NOTE — Progress Notes (Signed)
Hypoglycemic Event  CBG: 52  Treatment: carb snack  Symptoms: shaky  Follow-up CBG: Time: 00:56 CBG Result: 82  Possible Reasons for Event:  Unknown; pt stated she ate 100% breakfast, lunch, dinner & ate night snack  Comments/MD notified: yes   Sparks, Carmon Sails  Remember to initiate Hypoglycemia Order Set & complete

## 2013-05-02 NOTE — Progress Notes (Signed)
ANTIBIOTIC CONSULT NOTE - FOLLOW UP  Pharmacy Consult for Vancomycin and Levaquin Indication: RLE cellulitis  Allergies  Allergen Reactions  . Keflex [Cephalexin] Hives    Denies Airway involvement  . Adhesive [Tape] Rash    Latex  . Neosporin [Neomycin-Bacitracin Zn-Polymyx] Rash    Patient Measurements: Height: 5' 9"  (175.3 cm) Weight: 320 lb 11.2 oz (145.469 kg) IBW/kg (Calculated) : 66.2  Vital Signs: Temp: 98 F (36.7 C) (11/09 0940) Temp src: Oral (11/09 0940) BP: 121/65 mmHg (11/09 0940) Pulse Rate: 60 (11/09 0940) Intake/Output from previous day: 11/08 0701 - 11/09 0700 In: 1901.3 [P.O.:480; I.V.:1421.3] Out: -  Intake/Output from this shift: Total I/O In: 240 [P.O.:240] Out: -   Labs:  Recent Labs  04/29/13 1430 04/29/13 1441 04/30/13 0628 05/02/13 0530  WBC 16.2*  --  11.4* 6.1  HGB 13.2 15.3* 12.9 11.4*  PLT 278  --  241 238  CREATININE  --  1.20* 1.06 0.89   Estimated Creatinine Clearance: 106.5 ml/min (by C-G formula based on Cr of 0.89).  Assessment: 57yof continues on day #4 vancomycin and levaquin for RLE cellulitis. Erythema improving per MD note. Renal function stable. Plan to change to oral antibiotics within 1-2 days.  11/6 Vancomycin >> 11/6 Levaquin>> 11/7 PO Diflucan x 1  No cultures  Goal of Therapy:  Vancomycin trough level 10-15 mcg/ml  Plan:  1) Continue vancomycin 2g IV q24 - hold off on trough since switching to orals soon 2) Continue levaquin 717m IV q24  MDeboraha Sprang11/02/2013,1:30 PM

## 2013-05-02 NOTE — Progress Notes (Signed)
FMTS Attending Note Patient seen and examined by me today, discussed with Dr Yong Channel and I agree with his assessment and plan as per his note.  Patient remains afebrile and showing signs of clinical improvement with IV Vancomycin.  Would prefer to continue IV vanc for the time being before transitioning to oral abx. If she were to lose IV access, could consider transition to oral anti-MRSA regimen of abx and watch overnight for improvement.  If she were to spike a fever or show any clinical signs of regression, I would opt for IV access via PICC or central line for continued IV vancomycin. To draw blood cx x2 if spikes fever.  Dalbert Mayotte, MD

## 2013-05-03 DIAGNOSIS — M7989 Other specified soft tissue disorders: Secondary | ICD-10-CM

## 2013-05-03 DIAGNOSIS — M79609 Pain in unspecified limb: Secondary | ICD-10-CM

## 2013-05-03 LAB — GLUCOSE, CAPILLARY
Glucose-Capillary: 107 mg/dL — ABNORMAL HIGH (ref 70–99)
Glucose-Capillary: 209 mg/dL — ABNORMAL HIGH (ref 70–99)
Glucose-Capillary: 296 mg/dL — ABNORMAL HIGH (ref 70–99)

## 2013-05-03 MED ORDER — SULFAMETHOXAZOLE-TMP DS 800-160 MG PO TABS
1.0000 | ORAL_TABLET | Freq: Two times a day (BID) | ORAL | Status: DC
  Administered 2013-05-03 – 2013-05-04 (×3): 1 via ORAL
  Filled 2013-05-03 (×4): qty 1

## 2013-05-03 MED ORDER — INSULIN ASPART PROT & ASPART (70-30 MIX) 100 UNIT/ML ~~LOC~~ SUSP
65.0000 [IU] | Freq: Every day | SUBCUTANEOUS | Status: DC
  Administered 2013-05-03: 65 [IU] via SUBCUTANEOUS

## 2013-05-03 MED ORDER — LEVOFLOXACIN 750 MG PO TABS
750.0000 mg | ORAL_TABLET | Freq: Every day | ORAL | Status: DC
  Administered 2013-05-03 – 2013-05-04 (×2): 750 mg via ORAL
  Filled 2013-05-03 (×2): qty 1

## 2013-05-03 NOTE — Progress Notes (Signed)
Inpatient Diabetes Program Recommendations  AACE/ADA: New Consensus Statement on Inpatient Glycemic Control (2013)  Target Ranges:  Prepandial:   less than 140 mg/dL      Peak postprandial:   less than 180 mg/dL (1-2 hours)      Critically ill patients:  140 - 180 mg/dL     Results for [name], [name] (MRN [id number]) as of 05/03/2013 12:01  Ref. Range 05/02/2013 07:30 05/02/2013 11:28 05/02/2013 16:15 05/02/2013 21:40 05/02/2013 21:58 05/02/2013 22:15  Glucose-Capillary Latest Range: 70-99 mg/dL 137 (H) 167 (H) 159 (H) 56 (L) 62 (L) 79    **Noted patient had hypoglycemia last night at bedtime after receiving 110 units of Novolog 70/30 insulin.  Evening dose of 70/30 insulin reduced to 90 units for this evening.  **Confused as to why patient is taking 70/30 insulin with lunch and supper versus taking it with breakfast and supper.  Concerned that 70/30 insulin will overlap when given so close together.    **MD- Please consider switching 70/30 insulin to breakfast and supper   Note: Spoke with patient about her elevated A1c of 11.3%.  Explained what an A1c is and what it measures.  Reminded patient that her goal A1c is 7% or less per ADA standards to prevent both acute and long-term complications.  Encouraged patient to check her CBGs at least bid at home and to record all CBGs in a logbook for his PCP to review.  Also reviewed basic carbohydrate counting principles with patient and reviewed how to read a food label with patient.  Gave patient several informational pamphlets on carbohydrate counting to take home.  Patient seems very motivated to change her eating habits and explained to me that her CBG control here in the hospital is the best it has ever been   Will follow. Wyn Quaker RN, MSN, CDE Diabetes Coordinator Inpatient Diabetes Program Team Pager: [phone] (8a-10p)

## 2013-05-03 NOTE — Discharge Summary (Signed)
[facility] Discharge Summary  Patient name: [name] Medical record number: [id number] Date of birth: [birth year] Age: 57 y.o. Gender: female Date of Admission: 04/29/2013  Date of Discharge: 05/04/2013 Admitting Physician: Willeen Niece, MD  Primary Care Provider: Garret Reddish, MD Consultants: Diabetes coordinator  Indication for Hospitalization: RLE cellulitis  Discharge Diagnoses/Problem List:  Right lower extremity cellulitis Patient Active Problem List   Diagnosis Date Noted  . Anemia 06/19/2012  . CKD (chronic kidney disease), stage II 06/04/2012  . Low back pain 03/05/2012  . At high risk for falls 06/12/2011  . Osteoarthritis of knee 04/23/2011  . UNSPECIFIED VENOUS INSUFFICIENCY 05/28/2010  . PSORIASIS 05/28/2010  . HYPERCHOLESTEROLEMIA 03/20/2010  . HYPERTENSION 03/20/2010  . DERMATITIS, HANDS 05/02/2009  . PERIPHERAL NEUROPATHY 01/12/2009  . DM (diabetes mellitus), type 2, uncontrolled 02/18/2008  . DEPRESSION, CHRONIC 10/06/2007  . OBSTRUCTIVE SLEEP APNEA 06/12/2007  . GOITER, MULTINODULAR 05/13/2007   Disposition: Discharged home  Discharge Condition: Stable, improved  Discharge Exam:  General: Pleasant 57yo female in no acute distress Cardiovascular: S1S2, RRR, no murmur, distant due to body habitus Respiratory: Non-labored, CTAB Abdomen: soft, NT, ND Extremities: erythema along anterior aspect of RLE receded from demarcations. 4x2cm solid eschar present on anterior shin, unchanged from yesterday. No signs of lymphangiitis. LLE with tenderness to palpation of shin.   Brief Hospital Course:  [name] is a 57 y.o. female presenting on 11/6 with recurrent right lower extremity cellulitis, fever, and leukocytosis. PMH is significant for poorly controlled diabetes, obesity, and stage II chronic kidney disease.  She was admitted for IV antibiotics, vancomycin and levaquin, with defervescence the following day.  An MRI on  05/01/13 showed no osteomyelitis or abscess. Bilateral LE venous doppler study showed no sign of DVT. HIV was non reactive. The erythema continued to slowly recede from pen markings around its original perimeter, leukocytosis resolved and antibiotics were transitioned to oral levaquin and bactrim on 11/10. She was discharged on 11/11 with prescriptions to complete a 14 total day course. Leg elevation and TED hose were also employed and encouraged to continue on discharge.   There is an eschar on RLE in the center of the cellulitis which seems chronic per records. This failure to resolve is concerning and a biopsy is recommended as an outpatient.   The patient's poorly controlled insulin-dependent T2DM (last Hb A1c: 11.3) was treated with a decreased dose of her home regimen of Novolog 70/30: 80u qAC lunch, 130 units qAC with dinner. This caused 2 episodes of late evening symptomatic hypoglycemia. The PM dose was decreased to 65u with resolution of this, though with poorer control of blood sugars. On discharge, her recommended doses were 70u with lunch and 70u with dinner. As there is a history of non-adherence with multifactorial etiology, it was thought that this simplification may aid in compliance. Gabapentin was continued for peripheral neuropathy. She requested and received diabetes education prior to discharge and handouts concerning dietary carb counting.   Diflucan was given for and successfully treated a vaginal yeast infection. Lipitor and effexor were continued. Anti-hypertensives were continued with good BP control, but some occasional bradycardia. Toprol-XL dose was decreased with good response.   Issues for Follow Up:  - f/u Resolution of cellulitis - RLE eschar requires biopsy, and may benefit from wound care center - Reinforcement of diabetic diet, as pt requested education about it in the hospital.  - f/u social stressors, as pt's husband has MS and requires a large  investment of time  and energy to care for.   Significant Procedures: None  Significant Labs and Imaging:   Recent Labs Lab 04/29/13 1430 04/29/13 1441 04/30/13 0628 05/02/13 0530  WBC 16.2*  --  11.4* 6.1  HGB 13.2 15.3* 12.9 11.4*  HCT 41.0 45.0 40.6 35.8*  PLT 278  --  241 238    Recent Labs Lab 04/29/13 1441 04/30/13 0628 05/02/13 0530  NA 139 136 140  K 3.6 3.7 3.7  CL 100 101 105  CO2  --  21 24  GLUCOSE 197* 92 168*  BUN 20 17 14   CREATININE 1.20* 1.06 0.89  CALCIUM  --  9.2 8.9    LE VENOUS DOPPLER STUDY Summary: - No evidence of deep vein or superficial thrombosis involving the right lower extremity and left lower extremity. - No evidence of Baker's cyst on the right or left.  Results/Tests Pending at Time of Discharge: None  Discharge Medications:    Medication List    ASK your doctor about these medications       amLODipine 10 MG tablet  Commonly known as:  NORVASC  Take 10 mg by mouth daily.     aspirin EC 81 MG tablet  Take 1 tablet (81 mg total) by mouth daily.     atorvastatin 40 MG tablet  Commonly known as:  LIPITOR  Take 1 tablet (40 mg total) by mouth daily.     gabapentin 300 MG capsule  Commonly known as:  NEURONTIN  Take 900 mg by mouth 3 (three) times daily.     insulin aspart protamine- aspart (70-30) 100 UNIT/ML injection  Commonly known as:  NOVOLOG MIX 70/30  Take 80 units before lunch. Take 130 units before dinner. Call [phone] with any blood sugar less than 90.     metFORMIN 1000 MG tablet  Commonly known as:  GLUCOPHAGE  Take 1 tablet (1,000 mg total) by mouth 2 (two) times daily with a meal.     metoprolol succinate 50 MG 24 hr tablet  Commonly known as:  TOPROL-XL  Take 1 tablet (50 mg total) by mouth daily. ake with or immediately following a meal     quinapril 40 MG tablet  Commonly known as:  ACCUPRIL  Take 1 tablet (40 mg total) by mouth daily.     venlafaxine XR 75 MG 24 hr capsule  Commonly known as:  EFFEXOR-XR  Take  3 capsules (225 mg total) by mouth daily.        Discharge Instructions: Please refer to Patient Instructions section of EMR for full details.  Patient was counseled important signs and symptoms that should prompt return to medical care, changes in medications, dietary instructions, activity restrictions, and follow up appointments.   Follow-Up Appointments:     Follow-up Information   Follow up with Garret Reddish, MD On 05/11/2013. (1:45pm)    Specialty:  Family Medicine   Contact information:   [address] [place] [address] [phone]       Vance Gather, MD 05/03/2013, 12:11 PM PGY-1, [place]

## 2013-05-03 NOTE — Plan of Care (Signed)
Problem: Limited Adherence to Nutrition-Related Recommendations (NB-1.6) Goal: Nutrition education Formal process to instruct or train a patient/client in a skill or to impart knowledge to help patients/clients voluntarily manage or modify food choices and eating behavior to maintain or improve health. Outcome: Completed/Met Date Met:  05/03/13  RD consulted for nutrition education regarding diabetes.     Lab Results  Component Value Date    HGBA1C 11.3* 04/30/2013    RD provided "Carbohydrate Counting for People with Diabetes" handout from the Academy of Nutrition and Dietetics. Discussed different food groups and their effects on blood sugar, emphasizing carbohydrate-containing foods. Provided list of carbohydrates and recommended serving sizes of common foods.  Discussed importance of controlled and consistent carbohydrate intake throughout the day. Provided examples of ways to balance meals/snacks and encouraged intake of high-fiber, whole grain complex carbohydrates. Teach back method used.  Expect fair compliance.  Body mass index is 47.34 kg/(m^2). Pt meets criteria for Obesity Class III based on current BMI.  Current diet order is Carbohydrate Modified Medium Calorie, patient is consuming approximately 100% of meals at this time. Labs and medications reviewed. No further nutrition interventions warranted at this time. If additional nutrition issues arise, please re-consult RD.  Arthur Holms, RD, LDN Pager #: [phone] After-Hours Pager #: [phone]

## 2013-05-03 NOTE — Progress Notes (Signed)
Attending Addendum  I examined the patient and discussed the assessment and plan with Dr. Berkley Harvey. I have reviewed the note and agree.  Patient doing well with improving cellulitis, transitioned to PO meds. Adjusted insulin to avoid nighttime hypoglycemia. Anticipate d/c in two days.     [name], [place]

## 2013-05-03 NOTE — Progress Notes (Signed)
VASCULAR LAB PRELIMINARY  PRELIMINARY  PRELIMINARY  PRELIMINARY  Bilateral lower extremity venous Dopplers completed.    Preliminary report:  There is no DVT or SVT noted in the bilateral lower extremities.  Palmina Clodfelter, RVT 05/03/2013, 12:31 PM

## 2013-05-04 LAB — GLUCOSE, CAPILLARY
Glucose-Capillary: 153 mg/dL — ABNORMAL HIGH (ref 70–99)
Glucose-Capillary: 243 mg/dL — ABNORMAL HIGH (ref 70–99)

## 2013-05-04 MED ORDER — INSULIN ASPART PROT & ASPART (70-30 MIX) 100 UNIT/ML ~~LOC~~ SUSP: SUBCUTANEOUS | Status: DC

## 2013-05-04 MED ORDER — LEVOFLOXACIN 750 MG PO TABS: 750.0000 mg | ORAL_TABLET | Freq: Every day | ORAL | Status: DC

## 2013-05-04 MED ORDER — OXYCODONE HCL 5 MG PO TABS: 5.0000 mg | ORAL_TABLET | ORAL | Status: DC | PRN

## 2013-05-04 MED ORDER — SULFAMETHOXAZOLE-TMP DS 800-160 MG PO TABS: 1.0000 | ORAL_TABLET | Freq: Two times a day (BID) | ORAL | Status: DC

## 2013-05-04 NOTE — Progress Notes (Signed)
Discharged home.  Patient educated on discharge instructions, discharge medications, and follow-up appointments.  Patient given information on a diabetic diet with meal planning.  Patient verbalized understanding.  AVS signed.  Belongings gathered.  Patient refused 1700 CBG due to the fact that patient was being discharged and awaiting ride.  Telemetry removed, box returned and CMT notified.  Patient escorted out via wheelchair with NT.

## 2013-05-04 NOTE — Progress Notes (Signed)
Attending Addendum  I examined the patient and discussed the assessment and plan with Dr. Bonner Puna. I have reviewed the note and agree. Patient medically stable for d/c with improved R LE cellulitis. Will need biopsy of wound on RLE after completing cellulitis treatment.     [name], [place]

## 2013-05-04 NOTE — Progress Notes (Signed)
Family Medicine Teaching Service Daily Progress Note Intern Pager: [phone]  Patient name: [name] Medical record number: [id number] Date of birth: August 28, 1955 Age: 57 y.o. Gender: female  Primary Care Provider: Garret Reddish, MD Consultants: None Code Status: Full  Pt Overview and Major Events to Date:  11/6: Admitted for IV antibiotics for LE cellulitis with fever, leukocytosis in poorly-controlled diabetic.  11/8: MRI with cellulitis only, no osteomyelitis 11/10: Convert IV to PO antibiotics 11/11: Tolerating po abx, cellulitis slowly continues to improve, ready to go home.   Assessment and Plan:  [name] is a 57 y.o. female presenting with right lower extremity cellulitis. PMH is significant for poorly controlled diabetes, obesity, and stage II chronic kidney disease.   # Cellulitis:  - MRI 05/01/13: no osteomyelitis or abscess. HIV non reactive. Leukocytosis resolved. Afebrile since 11/7.  - Vancomycin and Levaquin IV (11/6-11/10) > oral levaquin and bactrim (11/10-) 10-14 day total course.  - Last febrile to 100.5 on 04/30/13 at noon - PRN: Tylenol pain/fever , OxyIR 4m q4h prn  - Leg elevation, TED hose. Consider wound care center at discharge  - Bilateral LE venous doppler study showed no sign of DVT  # Eschar on RLE seems chronic per records. Concerning for failure to resolve.  - Recommend biopsy soon as outpatient.   # Insulin-dependent T2DM: Uncontrolled; Hb A1c: 11.3 - Inpatient regimen: Novolog 70/30: 65u qAC lunch, 90u qAC dinner  - Home regimen was: Novolog 70/30: 80u qAC lunch, 130 units with dinner  - No hypoglycemia with reduced PM lantus of 65u of 70/30 - Continue gabapentin for peripheral neuropathy  - Received diabetes education yesterday.  - Will D/C on 70u BID and have PCP follow up adherence from there.   # Obesity # AKI: Resolved with rehydration. 1.2 > 0.89 # Normocytic anemia: Mild, Hgb 11.4, will recheck tomorrow AM # Vaginal yeast  infection: Symptoms resolved s/p diflucan. HIV negative (for recurrence).   # Hyperlipidemia: Continue lipitor  # HTN: Well controlled. HR borderline bradycardic. Continue toprol-XL at reduced dose (22m, amlodipine, and quinapril. Aspirin daily.  # Depression: Continue effexor   FEN/GI: KVO, diabetic diet  Prophylaxis: heparin SQ   Disposition: Observe for continued improvement after switch to PO abx  Subjective: Pt reports improved erythema, tolerating abx, tolerating po, wants to go home.   Objective: Temp:  [97.9 F (36.6 C)-98.4 F (36.9 C)] 98 F (36.7 C) (11/11 0451) Pulse Rate:  [56-60] 56 (11/11 0451) Resp:  [19-20] 19 (11/11 0451) BP: (127-145)/(61-87) 134/87 mmHg (11/11 0451) SpO2:  [95 %-97 %] 95 % (11/11 0451) Physical Exam: General: [name] Cardiovascular: S1S2, RRR, no murmur, distant due to body habitus Respiratory: Non-labored, CTAB Abdomen: soft, NT, ND Extremities: erythema along anterior aspect of RLE receded from demarcations. 4x2cm solid eschar present on anterior shin, unchanged from yesterday. No signs of lymphangiitis. LLE with tenderness to palpation of shin.   Laboratory:  Recent Labs Lab 04/29/13 1430 04/29/13 1441 04/30/13 0628 05/02/13 0530  WBC 16.2*  --  11.4* 6.1  HGB 13.2 15.3* 12.9 11.4*  HCT 41.0 45.0 40.6 35.8*  PLT 278  --  241 238    Recent Labs Lab 04/29/13 1441 04/30/13 0628 05/02/13 0530  NA 139 136 140  K 3.6 3.7 3.7  CL 100 101 105  CO2  --  21 24  BUN 20 17 14   CREATININE 1.20* 1.06 0.89  CALCIUM  --  9.2 8.9  GLUCOSE  197* 92 168*   Bilateral LE venous dopplers negative for DVT   Vance Gather, MD 05/04/2013, 9:34 AM PGY-1, [place] Intern pager: [phone], text pages welcome

## 2013-05-08 NOTE — Discharge Summary (Signed)
Attending Addendum  I examined the patient and discussed the discharge plan with Dr. Bonner Puna. I have reviewed the note and agree.    [name], [place]

## 2013-05-11 ENCOUNTER — Ambulatory Visit: Payer: Medicare HMO | Admitting: Family Medicine

## 2013-05-25 ENCOUNTER — Telehealth: Payer: Self-pay | Admitting: Family Medicine

## 2013-05-25 DIAGNOSIS — I10 Essential (primary) hypertension: Secondary | ICD-10-CM

## 2013-05-25 NOTE — Telephone Encounter (Signed)
Discussed with patient that since she is having only local anesthesia for cataract surgery, she does not need further cardiac evaluation. She mentioned to me that she had a stress test within 10 years that was abnormal but later had a "normal test from my groin" which she believes was catheterization.

## 2013-06-22 ENCOUNTER — Telehealth: Payer: Self-pay | Admitting: Family Medicine

## 2013-06-22 NOTE — Telephone Encounter (Signed)
Will complete and fax within 2 weeks. Please inform patient.

## 2013-06-22 NOTE — Telephone Encounter (Signed)
Pt called and would like Dr. Yong Channel to write a letter of support for Avya stating that gastric bypass surgery would help her. Please fax it to [phone] ATTN: Marlan Palau

## 2013-06-23 ENCOUNTER — Telehealth: Payer: Self-pay | Admitting: Family Medicine

## 2013-06-23 DIAGNOSIS — E7800 Pure hypercholesterolemia, unspecified: Secondary | ICD-10-CM

## 2013-06-23 MED ORDER — ATORVASTATIN CALCIUM 40 MG PO TABS: 40.0000 mg | ORAL_TABLET | Freq: Every day | ORAL | Status: DC

## 2013-06-23 NOTE — Telephone Encounter (Signed)
LMOVM for pt to call back. Demaris Leavell, [name]

## 2013-06-23 NOTE — Telephone Encounter (Signed)
Pt called and needs a refill on her Lipitor sent to her online pharmacy. jw

## 2013-06-23 NOTE — Telephone Encounter (Signed)
Attempted to e-prescribe to [facility] and [place] (only only pharmacy currently listed).

## 2013-06-28 ENCOUNTER — Ambulatory Visit: Payer: Self-pay | Admitting: Bariatrics

## 2013-06-30 ENCOUNTER — Ambulatory Visit: Payer: Self-pay | Admitting: Bariatrics

## 2013-06-30 DIAGNOSIS — E669 Obesity, unspecified: Secondary | ICD-10-CM

## 2013-06-30 LAB — CBC WITH DIFFERENTIAL/PLATELET
Basophil #: 0.1 10*3/uL (ref 0.0–0.1)
Basophil %: 1.1 %
EOS PCT: 5.1 %
Eosinophil #: 0.4 10*3/uL (ref 0.0–0.7)
HCT: 40.5 % (ref 35.0–47.0)
HGB: 13.2 g/dL (ref 12.0–16.0)
Lymphocyte #: 1.7 10*3/uL (ref 1.0–3.6)
Lymphocyte %: 24.6 %
MCH: 25.8 pg — ABNORMAL LOW (ref 26.0–34.0)
MCHC: 32.5 g/dL (ref 32.0–36.0)
MCV: 80 fL (ref 80–100)
MONO ABS: 0.6 x10 3/mm (ref 0.2–0.9)
MONOS PCT: 7.9 %
NEUTROS ABS: 4.3 10*3/uL (ref 1.4–6.5)
Neutrophil %: 61.3 %
Platelet: 266 10*3/uL (ref 150–440)
RBC: 5.1 10*6/uL (ref 3.80–5.20)
RDW: 15.9 % — AB (ref 11.5–14.5)
WBC: 7.1 10*3/uL (ref 3.6–11.0)

## 2013-06-30 LAB — HEMOGLOBIN A1C: Hemoglobin A1C: 11.2 % — ABNORMAL HIGH (ref 4.2–6.3)

## 2013-06-30 LAB — COMPREHENSIVE METABOLIC PANEL
ANION GAP: 7 (ref 7–16)
Albumin: 3.4 g/dL (ref 3.4–5.0)
Alkaline Phosphatase: 173 U/L — ABNORMAL HIGH
BUN: 21 mg/dL — ABNORMAL HIGH (ref 7–18)
Bilirubin,Total: 0.4 mg/dL (ref 0.2–1.0)
CREATININE: 1.03 mg/dL (ref 0.60–1.30)
Calcium, Total: 9.3 mg/dL (ref 8.5–10.1)
Chloride: 103 mmol/L (ref 98–107)
Co2: 29 mmol/L (ref 21–32)
EGFR (African American): >60
GFR CALC NON AF AMER: 60 — AB
Glucose: 238 mg/dL — ABNORMAL HIGH (ref 65–99)
Osmolality: 288 (ref 275–301)
Potassium: 3.8 mmol/L (ref 3.5–5.1)
SGOT(AST): 15 U/L (ref 15–37)
SGPT (ALT): 23 U/L (ref 12–78)
SODIUM: 139 mmol/L (ref 136–145)
Total Protein: 7.5 g/dL (ref 6.4–8.2)

## 2013-06-30 LAB — TSH: Thyroid Stimulating Horm: 5.7 u[IU]/mL — ABNORMAL HIGH

## 2013-06-30 LAB — APTT: Activated PTT: 26.4 secs (ref 23.6–35.9)

## 2013-06-30 LAB — FOLATE: FOLIC ACID: 14.7 ng/mL (ref 3.1–100.0)

## 2013-06-30 LAB — IRON AND TIBC
Iron Bind.Cap.(Total): 331 ug/dL (ref 250–450)
Iron Saturation: 15 %
Iron: 48 ug/dL — ABNORMAL LOW (ref 50–170)
Unbound Iron-Bind.Cap.: 283 ug/dL

## 2013-06-30 LAB — PHOSPHORUS: Phosphorus: 3.2 mg/dL (ref 2.5–4.9)

## 2013-06-30 LAB — PROTIME-INR
INR: 0.9
PROTHROMBIN TIME: 12.2 s (ref 11.5–14.7)

## 2013-06-30 LAB — FERRITIN: Ferritin (ARMC): 98 ng/mL (ref 8–388)

## 2013-06-30 LAB — LIPASE, BLOOD: LIPASE: 171 U/L (ref 73–393)

## 2013-06-30 LAB — BILIRUBIN, DIRECT: Bilirubin, Direct: <0.1 mg/dL (ref 0.00–0.20)

## 2013-06-30 LAB — MAGNESIUM: Magnesium: 1.7 mg/dL — ABNORMAL LOW

## 2013-06-30 LAB — AMYLASE: AMYLASE: 35 U/L (ref 25–115)

## 2013-07-05 ENCOUNTER — Ambulatory Visit (HOSPITAL_BASED_OUTPATIENT_CLINIC_OR_DEPARTMENT_OTHER): Payer: Medicare HMO | Attending: Bariatrics

## 2013-07-05 ENCOUNTER — Encounter: Payer: Self-pay | Admitting: Family Medicine

## 2013-07-05 ENCOUNTER — Ambulatory Visit (INDEPENDENT_AMBULATORY_CARE_PROVIDER_SITE_OTHER): Payer: Medicare HMO | Admitting: Family Medicine

## 2013-07-05 VITALS — Ht 68.0 in | Wt 305.0 lb

## 2013-07-05 VITALS — BP 132/70 | HR 64 | Temp 97.8°F | Wt 305.0 lb

## 2013-07-05 DIAGNOSIS — E1165 Type 2 diabetes mellitus with hyperglycemia: Secondary | ICD-10-CM

## 2013-07-05 DIAGNOSIS — Z9989 Dependence on other enabling machines and devices: Secondary | ICD-10-CM

## 2013-07-05 DIAGNOSIS — R0989 Other specified symptoms and signs involving the circulatory and respiratory systems: Secondary | ICD-10-CM | POA: Insufficient documentation

## 2013-07-05 DIAGNOSIS — I10 Essential (primary) hypertension: Secondary | ICD-10-CM

## 2013-07-05 DIAGNOSIS — IMO0001 Reserved for inherently not codable concepts without codable children: Secondary | ICD-10-CM

## 2013-07-05 DIAGNOSIS — R0609 Other forms of dyspnea: Secondary | ICD-10-CM | POA: Insufficient documentation

## 2013-07-05 DIAGNOSIS — E7800 Pure hypercholesterolemia, unspecified: Secondary | ICD-10-CM

## 2013-07-05 DIAGNOSIS — G4733 Obstructive sleep apnea (adult) (pediatric): Secondary | ICD-10-CM

## 2013-07-05 DIAGNOSIS — Z6841 Body Mass Index (BMI) 40.0 and over, adult: Secondary | ICD-10-CM | POA: Insufficient documentation

## 2013-07-05 DIAGNOSIS — IMO0002 Reserved for concepts with insufficient information to code with codable children: Secondary | ICD-10-CM

## 2013-07-05 LAB — LDL CHOLESTEROL, DIRECT: LDL DIRECT: 113 mg/dL — AB

## 2013-07-05 NOTE — Patient Instructions (Addendum)
Diabetes  Restart morning insulin at 80 units and 120 units in the evening before dinner. Keep taking metformin.   *Call me with any blood sugar less than 90.   *call me or send me a message in about 2 weeks to let me know what your sugars are doing, i may increase your doses.   *see me back in 1 month  High cholesterol  Call [facility] and tell them I e-prescribed the medicine.   Call us with the fax # if not and I will fax to them the lipitor  We will recheck your cholesterol today and then show how much it has improved in about 6 weeks.   Gastric Bypass  I will send your doctor a letter soon  Thanks for thinking about getting them to send me the information as well. That will be helpful  Great job on losing 7 lbs since i saw you last!   Thanks,  Dr. Yong Channel

## 2013-07-05 NOTE — Progress Notes (Signed)
[name] Reddish, MD Phone: [phone]  Subjective:  Chief complaint-noted   # DIABETES Type II Medications taking and tolerating-yes, metformin and 70/30 taking 70 and 70 in AM/PM. Sometimes takes 80 at night.  Blood Sugars per patient-fasting-200-300 in AM, 200-300 at night.  Diet-working on gastric bypass surgery, down 15 lbs from hospital d/c Regular Exercise-not yet, encouraged  On Aspirin-yes On statin-no, out for 1 month Daily foot monitoring-yes  ROS- Endorses occasional Polyuria,Polydipsia. Denies  Hypoglycemia symptoms (shaky, sweaty, hungry, weak anxious, tremor, palpitations, confusion, behavior change).   Hemoglobin a1c:  Lab Results  Component Value Date   HGBA1C 11.3* 04/30/2013   HGBA1C 10.6 02/04/2013   HGBA1C 10.2 10/22/2012   Hypertension/hyperlipidemia BP Readings from Last 3 Encounters:  07/05/13 156/76-repeat improved to <140/90  05/04/13 134/87  03/26/13 137/78  Compliant with medications-yes without side effects, amlodipine, metoprolol, quinapril. Ran out of lipitor 1 month ago.  ROS-Denies any CP, SOB, blurry vision.   Past Medical History Patient Active Problem List   Diagnosis Date Noted  . DM (diabetes mellitus), type 2, uncontrolled 02/18/2008    Priority: High  . At high risk for falls 06/12/2011    Priority: Medium  . HYPERCHOLESTEROLEMIA 03/20/2010    Priority: Medium  . HYPERTENSION 03/20/2010    Priority: Medium  . PERIPHERAL NEUROPATHY 01/12/2009    Priority: Medium  . DEPRESSION, CHRONIC 10/06/2007    Priority: Medium  . OBSTRUCTIVE SLEEP APNEA 06/12/2007    Priority: Medium  . CKD (chronic kidney disease), stage II 06/04/2012    Priority: Low  . UNSPECIFIED VENOUS INSUFFICIENCY 05/28/2010    Priority: Low  . Anemia 06/19/2012  . Low back pain 03/05/2012  . Osteoarthritis of knee 04/23/2011  . PSORIASIS 05/28/2010  . DERMATITIS, HANDS 05/02/2009  . GOITER, MULTINODULAR 05/13/2007    Medications- reviewed and  updated Current Outpatient Prescriptions  Medication Sig Dispense Refill  . amLODipine (NORVASC) 10 MG tablet Take 10 mg by mouth daily.      Marland Kitchen aspirin EC 81 MG tablet Take 1 tablet (81 mg total) by mouth daily.  90 tablet  3  . atorvastatin (LIPITOR) 40 MG tablet Take 1 tablet (40 mg total) by mouth daily.  90 tablet  3  . gabapentin (NEURONTIN) 300 MG capsule Take 900 mg by mouth 3 (three) times daily.       . insulin aspart protamine- aspart (NOVOLOG MIX 70/30) (70-30) 100 UNIT/ML injection Take 80 units before lunch and 100 units before dinner. Call [phone] with any blood sugar less than 90.      . metFORMIN (GLUCOPHAGE) 1000 MG tablet Take 1 tablet (1,000 mg total) by mouth 2 (two) times daily with a meal.  180 tablet  3  . metoprolol succinate (TOPROL-XL) 50 MG 24 hr tablet Take 1 tablet (50 mg total) by mouth daily. ake with or immediately following a meal  90 tablet  3  . quinapril (ACCUPRIL) 40 MG tablet Take 1 tablet (40 mg total) by mouth daily.  90 tablet  3  . venlafaxine XR (EFFEXOR-XR) 75 MG 24 hr capsule Take 3 capsules (225 mg total) by mouth daily.  270 capsule  3  . [DISCONTINUED] Alum & Mag Hydroxide-Simeth (MAGIC MOUTHWASH W/LIDOCAINE) SOLN Take 5 mLs by mouth 3 (three) times daily as needed.  100 mL  0   No current facility-administered medications for this visit.    Objective: BP 132/70  Pulse 64  Temp(Src) 97.8 F (36.6 C) (Oral)  Wt 305 lb (138.347 kg)  Gen: NAD, resting comfortably, obese CV: RRR no murmurs rubs or gallops Lungs: CTAB no crackles, wheeze, rhonchi Ext: trace edema, right leg with moderate erythema (similar to baseline) without any warmth or tenderness  Assessment/Plan:  DM (diabetes mellitus), type 2, uncontrolled Poorly controlled with most CBGs >200. Titrated home insulin back up to 80 units in AM and 120 in PM. Patient will call in 2 weeks b/c previously on 120-130 in PM and may need to continue to titrate up. Discussed hypoglycemia and to  call if this occurred.   Patient is working toward gastric bypass and I think this will be very helpful for patient's diabetes.   HYPERCHOLESTEROLEMIA Off of atorvastatin x 1 month. Asked patient to call [facility] and if they do not have rx, to call me so I can fax instead of e prescribe. Check LDL today-suspect very elevated and will need assist of statin to improve.   HYPERTENSION Well controlled on manual repeat. Continue current meds: quinapril, metoprolol, ,amlodipine.       Orders Placed This Encounter  Procedures  . LDL Cholesterol, Direct    Meds ordered this encounter  Medications  . insulin aspart protamine- aspart (NOVOLOG MIX 70/30) (70-30) 100 UNIT/ML injection    Sig: Take 80 units before lunch and 100 units before dinner. Call [phone] with any blood sugar less than 90.

## 2013-07-05 NOTE — Assessment & Plan Note (Signed)
Well controlled on manual repeat. Continue current meds: quinapril, metoprolol, ,amlodipine.

## 2013-07-05 NOTE — Assessment & Plan Note (Addendum)
Poorly controlled with most CBGs >200. Titrated home insulin back up to 80 units in AM and 120 in PM. Patient will call in 2 weeks b/c previously on 120-130 in PM and may need to continue to titrate up. Discussed hypoglycemia and to call if this occurred.   Patient is working toward gastric bypass and I think this will be very helpful for patient's diabetes.

## 2013-07-05 NOTE — Assessment & Plan Note (Signed)
Off of atorvastatin x 1 month. Asked patient to call [facility] and if they do not have rx, to call me so I can fax instead of e prescribe. Check LDL today-suspect very elevated and will need assist of statin to improve.

## 2013-07-06 ENCOUNTER — Encounter: Payer: Self-pay | Admitting: Family Medicine

## 2013-07-06 ENCOUNTER — Ambulatory Visit (HOSPITAL_BASED_OUTPATIENT_CLINIC_OR_DEPARTMENT_OTHER): Payer: Commercial Managed Care - HMO

## 2013-07-08 ENCOUNTER — Encounter: Payer: Self-pay | Admitting: Family Medicine

## 2013-07-08 NOTE — Telephone Encounter (Signed)
Have completed letter and routed to blue team to fax to bariatric surgeons

## 2013-07-10 DIAGNOSIS — G4733 Obstructive sleep apnea (adult) (pediatric): Secondary | ICD-10-CM

## 2013-07-10 NOTE — Sleep Study (Signed)
   NAME: [name] DATE OF BIRTH:  06-Mar-1956 MEDICAL RECORD NUMBER [id number]  LOCATION: [facility]  PHYSICIAN: Yeimi Debnam D  DATE OF STUDY: 07/05/2013  SLEEP STUDY TYPE: Nocturnal Polysomnogram               REFERRING PHYSICIAN: Ladora Daniel, MD  INDICATION FOR STUDY: Hypersomnia with sleep apnea  EPWORTH SLEEPINESS SCORE:   9/24 HEIGHT: 5' 8"  (172.7 cm)  WEIGHT: 305 lb (138.347 kg)    Body mass index is 46.39 kg/(m^2).  NECK SIZE: 19 in.  MEDICATIONS: Charted for review  SLEEP ARCHITECTURE: Split study protocol. During the diagnostic phase, total sleep time 152 minutes with sleep efficiency 82.8%. Stage I was 2.3%, stage II 97.7%, stage III and REM were absent. Sleep latency 24.5 minutes, awake after sleep onset 7 minutes, arousal index 1.2. Bedtime medication: None.  RESPIRATORY DATA: Split study protocol. Apnea hypopneas index (AHI) 15 per hour. There were 38 total events recorded, including 8 obstructive apneas, 1 central apnea, 29 hypopneas. Events were associated with non-supine sleep position. REM was absent. CPAP was titrated to 12 CWP with occasional residual hypopneas before study time ended. A few central apneas were noted during titration. A medium ResMed air fit F10 mask was worn with heated humidifier.  OXYGEN DATA: Moderate snoring before CPAP with oxygen desaturation to a nadir of 80% on room air. After CPAP control, snoring was prevented and mean oxygen saturation held 92.1% on room air.  CARDIAC DATA: Sinus rhythm with PVCs  MOVEMENT/PARASOMNIA: A few limb jerks were noted with little effect on sleep. Bathroom x1.  IMPRESSION/ RECOMMENDATION:   1) Moderate obstructive sleep apnea/hypoxia syndrome, AHI 15 per hour with nonsupine events. Moderate snoring with oxygen desaturation to a nadir of 80% on room air.  2) Successful CPAP titration to 12 CWP with a few residual hypopneas and an occasional central apnea. She wore a medium ResMed  air fit F10 fullface mask with heated humidifier. Snoring was prevented and mean oxygen saturation held 92.1% on room air. 3) A previous diagnostic polysomnogram on 04/06/2007 had recorded AHI of 41 per hour. Body weight for that study is not available.  Deneise Lever Diplomate, American Board of Sleep Medicine  Signed Baird Lyons M.D. ELECTRONICALLY SIGNED ON:  07/10/2013, 9:56 AM [place] PH: [phone]   FX: [phone] [place]

## 2013-07-12 ENCOUNTER — Encounter: Payer: Self-pay | Admitting: Family Medicine

## 2013-07-25 ENCOUNTER — Ambulatory Visit: Payer: Self-pay | Admitting: Bariatrics

## 2013-07-26 ENCOUNTER — Other Ambulatory Visit: Payer: Self-pay | Admitting: Family Medicine

## 2013-07-28 ENCOUNTER — Telehealth: Payer: Self-pay | Admitting: Family Medicine

## 2013-07-28 NOTE — Telephone Encounter (Signed)
[facility] rep called stating that she has been unable to contact patient. She states she has sent out numerous mailings & phone calls. They are not closing referral and would like Dr. Yong Channel to speak to patient regarding referral at her next visit. Patient scheduled to see Dr. Yong Channel on 2/12 @ 1:45 pm.

## 2013-08-05 ENCOUNTER — Ambulatory Visit: Payer: Medicare HMO | Admitting: Family Medicine

## 2013-08-11 ENCOUNTER — Ambulatory Visit: Payer: Medicare HMO | Admitting: Family Medicine

## 2013-08-16 ENCOUNTER — Other Ambulatory Visit: Payer: Self-pay | Admitting: Family Medicine

## 2013-08-16 DIAGNOSIS — Z1231 Encounter for screening mammogram for malignant neoplasm of breast: Secondary | ICD-10-CM

## 2013-08-25 ENCOUNTER — Ambulatory Visit (HOSPITAL_COMMUNITY)
Admission: RE | Admit: 2013-08-25 | Discharge: 2013-08-25 | Disposition: A | Discharge status: Home / Self Care | Payer: Medicare HMO | Source: Ambulatory Visit | Attending: Family Medicine | Admitting: Family Medicine

## 2013-08-25 ENCOUNTER — Encounter: Payer: Self-pay | Admitting: *Deleted

## 2013-08-25 DIAGNOSIS — Z1231 Encounter for screening mammogram for malignant neoplasm of breast: Secondary | ICD-10-CM | POA: Insufficient documentation

## 2013-08-26 ENCOUNTER — Ambulatory Visit (INDEPENDENT_AMBULATORY_CARE_PROVIDER_SITE_OTHER): Payer: Medicare HMO | Admitting: Cardiology

## 2013-08-26 ENCOUNTER — Encounter: Payer: Self-pay | Admitting: Cardiology

## 2013-08-26 VITALS — BP 137/67 | HR 57 | Ht 68.0 in | Wt 311.0 lb

## 2013-08-26 DIAGNOSIS — I2510 Atherosclerotic heart disease of native coronary artery without angina pectoris: Secondary | ICD-10-CM

## 2013-08-26 DIAGNOSIS — E785 Hyperlipidemia, unspecified: Secondary | ICD-10-CM

## 2013-08-26 DIAGNOSIS — G4733 Obstructive sleep apnea (adult) (pediatric): Secondary | ICD-10-CM

## 2013-08-26 DIAGNOSIS — Z9989 Dependence on other enabling machines and devices: Secondary | ICD-10-CM

## 2013-08-26 DIAGNOSIS — I10 Essential (primary) hypertension: Secondary | ICD-10-CM

## 2013-08-26 HISTORY — DX: Atherosclerotic heart disease of native coronary artery without angina pectoris: I25.10

## 2013-08-26 NOTE — Progress Notes (Signed)
Patient ID: [name], female   DOB: 01-19-1956, 58 y.o.   MRN: [id number]     Patient Name: [name] Date of Encounter: 08/26/2013  Primary Care Provider:  Garret Reddish, MD Primary Cardiologist:  Dorothy Spark  Problem List   Past Medical History  Diagnosis Date  . GOITER, MULTINODULAR 05/13/2007  . DIABETES MELLITUS, WITH NEUROLOGICAL COMPLICATIONS 5/64/3329  . HYPERCHOLESTEROLEMIA 03/20/2010  . DYSLIPIDEMIA 05/13/2007  . DEPRESSION, CHRONIC 10/06/2007  . OBSTRUCTIVE SLEEP APNEA 06/12/2007    uses CPAP  . PERIPHERAL NEUROPATHY 01/12/2009  . HYPERTENSION 03/20/2010  . UNSPECIFIED VENOUS INSUFFICIENCY 05/28/2010  . PSORIASIS 05/28/2010  . BACK PAIN, LUMBAR 11/19/2007  . ANEMIA, PERNICIOUS, HX OF 05/13/2007  . ASYMPTOMATIC POSTMENOPAUSAL STATUS 02/18/2008  . Hx of colonic polyps 12/04/2010  . NASH (nonalcoholic steatohepatitis)   . Cellulitis of leg, right 10/11/2010   Past Surgical History  Procedure Laterality Date  . Knee arthroscopy  2003    right  . Varicose vein surgery      Remotef  . Tonsillectomy    . Exercise myoview  01/24/2005  . Electrocardiogram  04/16/2006  . Rotator cuff repair  2008    right    Allergies  Allergies  Allergen Reactions  . Keflex [Cephalexin] Hives    Denies Airway involvement  . Adhesive [Tape] Rash    Latex  . Neosporin [Neomycin-Bacitracin Zn-Polymyx] Rash    HPI  A very pleasant 58 year old female with prior medical history of morbid obesity, obstructive sleep apnea, hypertension, insulin-dependent diabetes mellitus, hyperlipidemia, and known nonobstructive coronary artery disease who is being evaluated for possible gastric bypass surgery and is coming for preoperative evaluation. The patient has prior cardiac workup in 2006 when she underwent a stress test for abnormal EKG. The stress test was nonconclusive and she underwent cardiac catheterization that showed 30% stenosis in the LAD in 25% stenosis in RCA she was  treated medically. The patient states that she has been fairly active and doesn't have any chest pain, palpitations or syncope. She also denies orthopnea paroxysmal nocturnal dyspnea or significant lower extremity edema. She develops dyspnea on moderate exertion. She doesn't have family history of coronary artery disease she also has a history of objective sleep apnea using CPAP machine at home.  Home Medications  Prior to Admission medications   Medication Sig Start Date End Date Taking? Authorizing Provider  amLODipine (NORVASC) 10 MG tablet Take 10 mg by mouth daily.   Yes Historical Provider, MD  aspirin EC 81 MG tablet Take 1 tablet (81 mg total) by mouth daily. 10/12/12  Yes Marin Olp, MD  atorvastatin (LIPITOR) 40 MG tablet Take 1 tablet (40 mg total) by mouth daily. 06/23/13  Yes Marin Olp, MD  gabapentin (NEURONTIN) 300 MG capsule Take 900 mg by mouth 3 (three) times daily.    Yes Marin Olp, MD  insulin aspart protamine- aspart (NOVOLOG MIX 70/30) (70-30) 100 UNIT/ML injection Take 80 units before lunch and 100 units before dinner. Call [phone] with any blood sugar less than 90. 05/04/13  Yes Vance Gather, MD  metFORMIN (GLUCOPHAGE) 1000 MG tablet Take 1 tablet (1,000 mg total) by mouth 2 (two) times daily with a meal. 10/12/12 11/01/21 Yes Marin Olp, MD  metoprolol succinate (TOPROL-XL) 50 MG 24 hr tablet Take 1 tablet (50 mg total) by mouth daily. ake with or immediately following a meal 10/12/12  Yes Marin Olp, MD  quinapril (ACCUPRIL) 40 MG tablet Take 1 tablet (  40 mg total) by mouth daily. 12/24/12  Yes Marin Olp, MD  venlafaxine XR (EFFEXOR-XR) 75 MG 24 hr capsule Take 3 capsules (225 mg total) by mouth daily. 10/12/12  Yes Marin Olp, MD    Family History  Family History  Problem Relation Age of Onset  . Hyperlipidemia Father   . Hypertension Father   . Cirrhosis Father   . Colon cancer Father 51  . Colon cancer Sister 19  . Aneurysm  Mother   . Drug abuse Neg Hx   . CAD Neg Hx   . Stomach cancer Neg Hx   . Cerebral aneurysm Mother     Social History  History   Social History  . Marital Status: Married    Spouse Name: N/A    Number of Children: 2  . Years of Education: HSG   Occupational History  . Unemployed    Social History Main Topics  . Smoking status: Former Smoker -- 2.00 packs/day for 24 years    Types: Cigarettes    Start date: 06/24/1970    Quit date: 08/06/1994  . Smokeless tobacco: Never Used     Comment: [place] Slims Regular  . Alcohol Use: No  . Drug Use: No  . Sexual Activity: Not on file   Other Topics Concern  . Not on file   Social History Narrative   Married '91   [id number] x 2, 1 son '91 8.5lbs, 1 daughter '92 10lbs           Review of Systems, as per HPI, otherwise negative General:  No chills, fever, night sweats or weight changes.  Cardiovascular:  No chest pain, dyspnea on exertion, edema, orthopnea, palpitations, paroxysmal nocturnal dyspnea. Dermatological: No rash, lesions/masses Respiratory: No cough, dyspnea Urologic: No hematuria, dysuria Abdominal:   No nausea, vomiting, diarrhea, bright red blood per rectum, melena, or hematemesis Neurologic:  No visual changes, wkns, changes in mental status. All other systems reviewed and are otherwise negative except as noted above.  Physical Exam  Blood pressure 137/67, pulse 57, height 5' 8"  (1.727 m), weight 311 lb (141.069 kg).  General: Pleasant, NAD, obese Psych: Normal affect. Neuro: Alert and oriented X 3. Moves all extremities spontaneously. HEENT: Normal  Neck: Supple without bruits or JVD. Lungs:  Resp regular and unlabored, CTA. Heart: RRR no s3, s4, or murmurs. Abdomen: Soft, non-tender, non-distended, BS + x 4.  Extremities: No clubbing, cyanosis, mild B/P non-pitting edema. DP/PT/Radials 2+ and equal bilaterally.  Labs:  No results found for this basename: CKTOTAL, CKMB, TROPONINI,  in the last  72 hours Lab Results  Component Value Date   WBC 6.1 05/02/2013   HGB 11.4* 05/02/2013   HCT 35.8* 05/02/2013   MCV 81.9 05/02/2013   PLT 238 05/02/2013   No results found for this basename: NA, K, CL, CO2, BUN, CREATININE, CALCIUM, LABALBU, PROT, BILITOT, ALKPHOS, ALT, AST, GLUCOSE,  in the last 168 hours Lab Results  Component Value Date   CHOL 136 06/19/2012   HDL 30* 06/19/2012   LDLCALC 45 06/19/2012   TRIG 304* 06/19/2012   Accessory Clinical Findings  echocardiogram  ECG - sinus bradycardia, 57 beats per minute, rightward axis, nonspecific ST-T wave abnormalities.    Assessment & Plan  58 year old female with history of diabetes, hypertension, hyperlipidemia, obstructive sleep apnea, morbid obesity that is coming for preoperative evaluation for gastric bypass surgery. The patient has known nonobstructive coronary artery disease based on cardiac catheterization in 2006. This cardiac catheterization was  performed based on abnormal EKG and the patient has never been symptomatic. She gets short of breath on moderate exertion however her dyspnea can most probably be attributed to her obesity. Her symptoms haven't changed since her last cardiac workup and her EKG is unchanged as well. She can certainly achieve 4 METS and has no signs suggestive obstructive CAD or congestive heart failure. She is currently considered a low risk for an intermediate risk surgery. There is no contraindication from cardiac standpoint for this patient to undergo gastric bypass surgery. We recommend that she continues taking metoprolol in the perioperative period.  Thank you for the referral please call us with you have any questions.  Dorothy Spark, MD, [facility] 08/26/2013, 2:35 PM

## 2013-08-26 NOTE — Patient Instructions (Signed)
**Note De-Identified  Obfuscation** Your physician recommends that you continue on your current medications as directed. Please refer to the Current Medication list given to you today.  Your physician recommends that you schedule a follow-up appointment in: as needed

## 2013-10-06 ENCOUNTER — Encounter: Payer: Self-pay | Admitting: Family Medicine

## 2013-10-06 ENCOUNTER — Ambulatory Visit (INDEPENDENT_AMBULATORY_CARE_PROVIDER_SITE_OTHER): Payer: Medicare HMO | Admitting: Family Medicine

## 2013-10-06 VITALS — BP 153/79 | HR 73 | Temp 98.3°F | Ht 68.0 in | Wt 306.6 lb

## 2013-10-06 DIAGNOSIS — IMO0002 Reserved for concepts with insufficient information to code with codable children: Secondary | ICD-10-CM

## 2013-10-06 DIAGNOSIS — IMO0001 Reserved for inherently not codable concepts without codable children: Secondary | ICD-10-CM

## 2013-10-06 DIAGNOSIS — E1165 Type 2 diabetes mellitus with hyperglycemia: Secondary | ICD-10-CM

## 2013-10-06 DIAGNOSIS — I10 Essential (primary) hypertension: Secondary | ICD-10-CM

## 2013-10-06 LAB — POCT GLYCOSYLATED HEMOGLOBIN (HGB A1C): HEMOGLOBIN A1C: 12.3

## 2013-10-06 NOTE — Assessment & Plan Note (Signed)
SBP poorly controlled today but patient took medicine within 30 minutes of check. Will continue to monitor for now, may have some decrease after bypass and weight loss.

## 2013-10-06 NOTE — Patient Instructions (Signed)
I would encourage you to take your insulin regularly. Try finding something at home to set an alarm. Your a1c continues to go up. Hopefully the exercise and the gastric bypass will help.   Blood pressure up slightly but we will continue to monitor for now as you just took your medicine before coming in.   Health Maintenance Due  Topic Date Due  . Foot Exam - today 08/27/2013   Thanks and see [place] every 3 months at least (Im ok with waiting until after surgery),  Dr. Yong Channel

## 2013-10-06 NOTE — Progress Notes (Signed)
Garret Reddish, MD Phone: [phone]  Subjective:   [name] is a 58 y.o. year old very pleasant female patient who presents with the following:  DIABETES Type II Medications taking and tolerating-novolog 70/30 80-140 units int he morning, states rarely takes PM dose due to forgetting Blood Sugars per patient-fasting- 200-300 (occasionally over 400 and she takes 140 units) Regular Exercise-started at [facility] 1 week ago   There are no preventive care reminders to display for this patient. On Aspirin-yes On statin-yes Daily foot monitoring-yes  ROS- Denies Polyuria,Polydipsia, nocturia, Vision changes. Endorses foot numbness/pain/tingling improved by gabapentin. Denies Hypoglycemia symptoms (shaky, sweaty, hungry, weak anxious, tremor, palpitations, confusion, behavior change).   Hemoglobin a1c:  Lab Results  Component Value Date   HGBA1C 12.3 10/06/2013   HGBA1C 11.3* 04/30/2013   HGBA1C 10.6 02/04/2013   Hypertension BP Readings from Last 3 Encounters:  10/06/13 153/79  08/26/13 137/67  07/05/13 132/70  Home BP monitoring-no Compliant with medications-yes without side effects but just took doses before leaving 30 minutes ago.  ROS-Denies any chest pain or shortness of breath  Past Medical History- Patient Active Problem List   Diagnosis Date Noted  . DM (diabetes mellitus), type 2, uncontrolled 02/18/2008    Priority: High  . At high risk for falls 06/12/2011    Priority: Medium  . HYPERCHOLESTEROLEMIA 03/20/2010    Priority: Medium  . HYPERTENSION 03/20/2010    Priority: Medium  . PERIPHERAL NEUROPATHY 01/12/2009    Priority: Medium  . DEPRESSION, CHRONIC 10/06/2007    Priority: Medium  . OBSTRUCTIVE SLEEP APNEA 06/12/2007    Priority: Medium  . CKD (chronic kidney disease), stage II 06/04/2012    Priority: Low  . UNSPECIFIED VENOUS INSUFFICIENCY 05/28/2010    Priority: Low  . Hyperlipidemia 08/26/2013  . CAD (coronary artery disease), native coronary  artery 08/26/2013  . Morbid obesity 08/26/2013  . OSA on CPAP 08/26/2013  . Anemia 06/19/2012  . Low back pain 03/05/2012  . Osteoarthritis of knee 04/23/2011  . PSORIASIS 05/28/2010  . DERMATITIS, HANDS 05/02/2009  . GOITER, MULTINODULAR 05/13/2007   Medications- reviewed and updated Current Outpatient Prescriptions  Medication Sig Dispense Refill  . amLODipine (NORVASC) 10 MG tablet Take 10 mg by mouth daily.      Marland Kitchen aspirin EC 81 MG tablet Take 1 tablet (81 mg total) by mouth daily.  90 tablet  3  . atorvastatin (LIPITOR) 40 MG tablet Take 1 tablet (40 mg total) by mouth daily.  90 tablet  3  . gabapentin (NEURONTIN) 300 MG capsule Take 900 mg by mouth 3 (three) times daily.       . insulin aspart protamine- aspart (NOVOLOG MIX 70/30) (70-30) 100 UNIT/ML injection Take 80 units before lunch and 100 units before dinner. Call [phone] with any blood sugar less than 90.      . metFORMIN (GLUCOPHAGE) 1000 MG tablet Take 1 tablet (1,000 mg total) by mouth 2 (two) times daily with a meal.  180 tablet  3  . metoprolol succinate (TOPROL-XL) 50 MG 24 hr tablet Take 1 tablet (50 mg total) by mouth daily. ake with or immediately following a meal  90 tablet  3  . quinapril (ACCUPRIL) 40 MG tablet Take 1 tablet (40 mg total) by mouth daily.  90 tablet  3  . venlafaxine XR (EFFEXOR-XR) 75 MG 24 hr capsule Take 3 capsules (225 mg total) by mouth daily.  270 capsule  3  . [DISCONTINUED] Alum & Mag Hydroxide-Simeth (MAGIC MOUTHWASH W/LIDOCAINE)  SOLN Take 5 mLs by mouth 3 (three) times daily as needed.  100 mL  0   No current facility-administered medications for this visit.    Objective: BP 153/79  Pulse 73  Temp(Src) 98.3 F (36.8 C) (Oral)  Ht 5' 8"  (1.727 m)  Wt 306 lb 9.6 oz (139.073 kg)  BMI 46.63 kg/m2 Gen: NAD, resting comfortably, morbidly obese CV: RRR no murmurs rubs or gallops Lungs: CTAB no crackles, wheeze, rhonchi Ext: trace edema DM foot exam: no sensation to monofilament on  bottom of feet, 2+ DP and PT pulses bilaterall, patient had recently peeled some skin off of left big toe near nail and there is mild local skin irritation with some erythema without warmth  Assessment/Plan:  DM (diabetes mellitus), type 2, uncontrolled Very poorly controlled and worsening. Little I can do for patient as she admits she just continues to forget PM doses. Counseled on use of alarm as well as importance of regular use of BID insulin. Would prefer for her to be on Lantus but she has not been able to afford this in the past and still has 4 vials of 70/30. I also suspect she has missed many AM doses of insulin as well based on how much insulin she has left. She seems to have put all her hope in her bypass and is forgetting daily maintenance of diabetes. I am hopeful she will see drastically decreased insulin need after gastric bypass and we can find a suitable regimen for her.   Patient was previously lost to [facility] coordination due to inability to maintain contact. I also fear for patient's compliance with diet after gastric bypass and regular follow up needed.   HYPERTENSION SBP poorly controlled today but patient took medicine within 30 minutes of check. Will continue to monitor for now, may have some decrease after bypass and weight loss.    Patient mentioned she may need to crush meds after surgery. Discussed with Dr. Valentina Lucks and if that is case only needed changes would be to venlafaxine IR 31m TID and Metoprolol tartrate 271mBID.   Orders Placed This Encounter  Procedures  . POCT A1C

## 2013-10-06 NOTE — Assessment & Plan Note (Addendum)
Very poorly controlled and worsening. Little I can do for patient as she admits she just continues to forget PM doses. Counseled on use of alarm as well as importance of regular use of BID insulin. Would prefer for her to be on Lantus but she has not been able to afford this in the past and still has 4 vials of 70/30. I also suspect she has missed many AM doses of insulin as well based on how much insulin she has left. She seems to have put all her hope in her bypass and is forgetting daily maintenance of diabetes. I am hopeful she will see drastically decreased insulin need after gastric bypass and we can find a suitable regimen for her.   Patient was previously lost to [facility] coordination due to inability to maintain contact. I also fear for patient's compliance with diet after gastric bypass and regular follow up needed.

## 2013-10-28 ENCOUNTER — Inpatient Hospital Stay (HOSPITAL_COMMUNITY)
Admission: EM | Admit: 2013-10-28 | Discharge: 2013-10-31 | DRG: 603 | Disposition: A | Discharge status: Home / Self Care | Payer: Medicare HMO | Attending: Family Medicine | Admitting: Family Medicine

## 2013-10-28 ENCOUNTER — Emergency Department (HOSPITAL_COMMUNITY): Payer: Medicare HMO

## 2013-10-28 ENCOUNTER — Encounter (HOSPITAL_COMMUNITY): Payer: Self-pay | Admitting: Radiology

## 2013-10-28 ENCOUNTER — Telehealth: Payer: Self-pay | Admitting: *Deleted

## 2013-10-28 DIAGNOSIS — R739 Hyperglycemia, unspecified: Secondary | ICD-10-CM

## 2013-10-28 DIAGNOSIS — M179 Osteoarthritis of knee, unspecified: Secondary | ICD-10-CM

## 2013-10-28 DIAGNOSIS — Z79899 Other long term (current) drug therapy: Secondary | ICD-10-CM

## 2013-10-28 DIAGNOSIS — L03119 Cellulitis of unspecified part of limb: Principal | ICD-10-CM

## 2013-10-28 DIAGNOSIS — E876 Hypokalemia: Secondary | ICD-10-CM | POA: Diagnosis present

## 2013-10-28 DIAGNOSIS — R079 Chest pain, unspecified: Secondary | ICD-10-CM

## 2013-10-28 DIAGNOSIS — F3289 Other specified depressive episodes: Secondary | ICD-10-CM | POA: Diagnosis present

## 2013-10-28 DIAGNOSIS — L03116 Cellulitis of left lower limb: Secondary | ICD-10-CM

## 2013-10-28 DIAGNOSIS — I872 Venous insufficiency (chronic) (peripheral): Secondary | ICD-10-CM

## 2013-10-28 DIAGNOSIS — N182 Chronic kidney disease, stage 2 (mild): Secondary | ICD-10-CM | POA: Diagnosis present

## 2013-10-28 DIAGNOSIS — I10 Essential (primary) hypertension: Secondary | ICD-10-CM | POA: Diagnosis present

## 2013-10-28 DIAGNOSIS — R109 Unspecified abdominal pain: Secondary | ICD-10-CM

## 2013-10-28 DIAGNOSIS — L0390 Cellulitis, unspecified: Secondary | ICD-10-CM | POA: Diagnosis present

## 2013-10-28 DIAGNOSIS — G629 Polyneuropathy, unspecified: Secondary | ICD-10-CM | POA: Diagnosis present

## 2013-10-28 DIAGNOSIS — E1142 Type 2 diabetes mellitus with diabetic polyneuropathy: Secondary | ICD-10-CM | POA: Diagnosis present

## 2013-10-28 DIAGNOSIS — E662 Morbid (severe) obesity with alveolar hypoventilation: Secondary | ICD-10-CM | POA: Diagnosis present

## 2013-10-28 DIAGNOSIS — Z7982 Long term (current) use of aspirin: Secondary | ICD-10-CM

## 2013-10-28 DIAGNOSIS — Z6841 Body Mass Index (BMI) 40.0 and over, adult: Secondary | ICD-10-CM

## 2013-10-28 DIAGNOSIS — M7989 Other specified soft tissue disorders: Secondary | ICD-10-CM

## 2013-10-28 DIAGNOSIS — E669 Obesity, unspecified: Secondary | ICD-10-CM | POA: Diagnosis present

## 2013-10-28 DIAGNOSIS — F329 Major depressive disorder, single episode, unspecified: Secondary | ICD-10-CM | POA: Diagnosis present

## 2013-10-28 DIAGNOSIS — M1710 Unilateral primary osteoarthritis, unspecified knee: Secondary | ICD-10-CM

## 2013-10-28 DIAGNOSIS — E1165 Type 2 diabetes mellitus with hyperglycemia: Secondary | ICD-10-CM

## 2013-10-28 DIAGNOSIS — R0602 Shortness of breath: Secondary | ICD-10-CM

## 2013-10-28 DIAGNOSIS — I129 Hypertensive chronic kidney disease with stage 1 through stage 4 chronic kidney disease, or unspecified chronic kidney disease: Secondary | ICD-10-CM | POA: Diagnosis present

## 2013-10-28 DIAGNOSIS — Z794 Long term (current) use of insulin: Secondary | ICD-10-CM

## 2013-10-28 DIAGNOSIS — E1140 Type 2 diabetes mellitus with diabetic neuropathy, unspecified: Secondary | ICD-10-CM | POA: Diagnosis present

## 2013-10-28 DIAGNOSIS — E785 Hyperlipidemia, unspecified: Secondary | ICD-10-CM | POA: Diagnosis present

## 2013-10-28 DIAGNOSIS — IMO0002 Reserved for concepts with insufficient information to code with codable children: Secondary | ICD-10-CM

## 2013-10-28 DIAGNOSIS — Z87891 Personal history of nicotine dependence: Secondary | ICD-10-CM

## 2013-10-28 DIAGNOSIS — G4730 Sleep apnea, unspecified: Secondary | ICD-10-CM | POA: Diagnosis present

## 2013-10-28 DIAGNOSIS — I498 Other specified cardiac arrhythmias: Secondary | ICD-10-CM | POA: Diagnosis not present

## 2013-10-28 DIAGNOSIS — I2510 Atherosclerotic heart disease of native coronary artery without angina pectoris: Secondary | ICD-10-CM | POA: Diagnosis present

## 2013-10-28 DIAGNOSIS — G4733 Obstructive sleep apnea (adult) (pediatric): Secondary | ICD-10-CM | POA: Diagnosis present

## 2013-10-28 DIAGNOSIS — L02419 Cutaneous abscess of limb, unspecified: Principal | ICD-10-CM | POA: Diagnosis present

## 2013-10-28 DIAGNOSIS — E1149 Type 2 diabetes mellitus with other diabetic neurological complication: Secondary | ICD-10-CM | POA: Diagnosis present

## 2013-10-28 HISTORY — DX: Cellulitis of left lower limb: L03.116

## 2013-10-28 LAB — CBC WITH DIFFERENTIAL/PLATELET
BASOS PCT: 0 % (ref 0–1)
Basophils Absolute: 0 10*3/uL (ref 0.0–0.1)
EOS ABS: 0 10*3/uL (ref 0.0–0.7)
EOS PCT: 0 % (ref 0–5)
HCT: 41 % (ref 36.0–46.0)
Hemoglobin: 13.6 g/dL (ref 12.0–15.0)
Lymphocytes Relative: 10 % — ABNORMAL LOW (ref 12–46)
Lymphs Abs: 1.4 10*3/uL (ref 0.7–4.0)
MCH: 26.2 pg (ref 26.0–34.0)
MCHC: 33.2 g/dL (ref 30.0–36.0)
MCV: 79 fL (ref 78.0–100.0)
MONO ABS: 1 10*3/uL (ref 0.1–1.0)
MONOS PCT: 7 % (ref 3–12)
NEUTROS PCT: 83 % — AB (ref 43–77)
Neutro Abs: 10.9 10*3/uL — ABNORMAL HIGH (ref 1.7–7.7)
Platelets: 206 10*3/uL (ref 150–400)
RBC: 5.19 MIL/uL — ABNORMAL HIGH (ref 3.87–5.11)
RDW: 14.7 % (ref 11.5–15.5)
WBC: 13.2 10*3/uL — ABNORMAL HIGH (ref 4.0–10.5)

## 2013-10-28 LAB — URINALYSIS, ROUTINE W REFLEX MICROSCOPIC
Glucose, UA: 250 mg/dL — AB
Hgb urine dipstick: NEGATIVE
Ketones, ur: 15 mg/dL — AB
Leukocytes, UA: NEGATIVE
NITRITE: NEGATIVE
Protein, ur: 100 mg/dL — AB
SPECIFIC GRAVITY, URINE: 1.031 — AB (ref 1.005–1.030)
UROBILINOGEN UA: 0.2 mg/dL (ref 0.0–1.0)
pH: 5.5 (ref 5.0–8.0)

## 2013-10-28 LAB — CBG MONITORING, ED
Glucose-Capillary: 214 mg/dL — ABNORMAL HIGH (ref 70–99)
Glucose-Capillary: 207 mg/dL — ABNORMAL HIGH (ref 70–99)

## 2013-10-28 LAB — URINE MICROSCOPIC-ADD ON

## 2013-10-28 LAB — COMPREHENSIVE METABOLIC PANEL
ALT: 12 U/L (ref 0–35)
AST: 17 U/L (ref 0–37)
Albumin: 3.2 g/dL — ABNORMAL LOW (ref 3.5–5.2)
Alkaline Phosphatase: 155 U/L — ABNORMAL HIGH (ref 39–117)
BUN: 24 mg/dL — AB (ref 6–23)
CO2: 23 mEq/L (ref 19–32)
CREATININE: 1.18 mg/dL — AB (ref 0.50–1.10)
Calcium: 9.5 mg/dL (ref 8.4–10.5)
Chloride: 93 mEq/L — ABNORMAL LOW (ref 96–112)
GFR calc non Af Amer: 49 mL/min — ABNORMAL LOW (ref >90)
GFR, EST AFRICAN AMERICAN: 57 mL/min — AB (ref >90)
Glucose, Bld: 230 mg/dL — ABNORMAL HIGH (ref 70–99)
POTASSIUM: 3.3 meq/L — AB (ref 3.7–5.3)
Sodium: 137 mEq/L (ref 137–147)
TOTAL PROTEIN: 7.5 g/dL (ref 6.0–8.3)
Total Bilirubin: 0.6 mg/dL (ref 0.3–1.2)

## 2013-10-28 LAB — I-STAT CG4 LACTIC ACID, ED: Lactic Acid, Venous: 1.68 mmol/L (ref 0.5–2.2)

## 2013-10-28 LAB — TROPONIN I

## 2013-10-28 LAB — GLUCOSE, CAPILLARY: Glucose-Capillary: 199 mg/dL — ABNORMAL HIGH (ref 70–99)

## 2013-10-28 LAB — LIPASE, BLOOD: LIPASE: 32 U/L (ref 11–59)

## 2013-10-28 MED ORDER — ASPIRIN EC 81 MG PO TBEC
81.0000 mg | DELAYED_RELEASE_TABLET | Freq: Every day | ORAL | Status: DC
  Administered 2013-10-29 – 2013-10-31 (×3): 81 mg via ORAL
  Filled 2013-10-28 (×3): qty 1

## 2013-10-28 MED ORDER — PIPERACILLIN-TAZOBACTAM 3.375 G IVPB: 3.3750 g | Freq: Once | INTRAVENOUS | Status: DC

## 2013-10-28 MED ORDER — VENLAFAXINE HCL ER 75 MG PO CP24
225.0000 mg | ORAL_CAPSULE | Freq: Every day | ORAL | Status: DC
  Administered 2013-10-29 – 2013-10-31 (×3): 225 mg via ORAL
  Filled 2013-10-28 (×3): qty 1

## 2013-10-28 MED ORDER — METOPROLOL SUCCINATE ER 50 MG PO TB24
50.0000 mg | ORAL_TABLET | Freq: Every day | ORAL | Status: DC
  Administered 2013-10-29 – 2013-10-30 (×2): 50 mg via ORAL
  Filled 2013-10-28 (×3): qty 1

## 2013-10-28 MED ORDER — HEPARIN SODIUM (PORCINE) 5000 UNIT/ML IJ SOLN
5000.0000 [IU] | Freq: Three times a day (TID) | INTRAMUSCULAR | Status: DC
  Administered 2013-10-29 – 2013-10-31 (×8): 5000 [IU] via SUBCUTANEOUS
  Filled 2013-10-28 (×11): qty 1

## 2013-10-28 MED ORDER — AMLODIPINE BESYLATE 10 MG PO TABS
10.0000 mg | ORAL_TABLET | Freq: Every day | ORAL | Status: DC
  Administered 2013-10-29: 10 mg via ORAL
  Filled 2013-10-28 (×2): qty 1

## 2013-10-28 MED ORDER — PIPERACILLIN-TAZOBACTAM 3.375 G IVPB
3.3750 g | Freq: Three times a day (TID) | INTRAVENOUS | Status: DC
  Administered 2013-10-29 (×3): 3.375 g via INTRAVENOUS
  Filled 2013-10-28 (×5): qty 50

## 2013-10-28 MED ORDER — SODIUM CHLORIDE 0.9 % IJ SOLN
3.0000 mL | Freq: Two times a day (BID) | INTRAMUSCULAR | Status: DC
  Administered 2013-10-28 – 2013-10-29 (×2): 3 mL via INTRAVENOUS

## 2013-10-28 MED ORDER — SODIUM CHLORIDE 0.9 % IV SOLN
INTRAVENOUS | Status: AC
  Administered 2013-10-28: 18:00:00 via INTRAVENOUS

## 2013-10-28 MED ORDER — CLINDAMYCIN PHOSPHATE 600 MG/50ML IV SOLN
600.0000 mg | Freq: Once | INTRAVENOUS | Status: DC
  Filled 2013-10-28: qty 50

## 2013-10-28 MED ORDER — GABAPENTIN 300 MG PO CAPS
900.0000 mg | ORAL_CAPSULE | Freq: Three times a day (TID) | ORAL | Status: DC
  Administered 2013-10-29 – 2013-10-31 (×8): 900 mg via ORAL
  Filled 2013-10-28 (×10): qty 3

## 2013-10-28 MED ORDER — SODIUM CHLORIDE 0.9 % IV BOLUS (SEPSIS)
1000.0000 mL | Freq: Once | INTRAVENOUS | Status: AC
  Administered 2013-10-28: 1000 mL via INTRAVENOUS

## 2013-10-28 MED ORDER — VANCOMYCIN HCL IN DEXTROSE 1-5 GM/200ML-% IV SOLN
1000.0000 mg | Freq: Once | INTRAVENOUS | Status: AC
  Administered 2013-10-28: 1000 mg via INTRAVENOUS
  Filled 2013-10-28: qty 200

## 2013-10-28 MED ORDER — MORPHINE SULFATE 4 MG/ML IJ SOLN
4.0000 mg | Freq: Once | INTRAMUSCULAR | Status: AC
  Administered 2013-10-28: 4 mg via INTRAVENOUS
  Filled 2013-10-28: qty 1

## 2013-10-28 MED ORDER — INSULIN ASPART 100 UNIT/ML ~~LOC~~ SOLN
0.0000 [IU] | Freq: Three times a day (TID) | SUBCUTANEOUS | Status: DC
  Administered 2013-10-29: 11 [IU] via SUBCUTANEOUS
  Administered 2013-10-29: 4 [IU] via SUBCUTANEOUS
  Administered 2013-10-29: 11 [IU] via SUBCUTANEOUS
  Administered 2013-10-30: 7 [IU] via SUBCUTANEOUS
  Administered 2013-10-30 (×2): 11 [IU] via SUBCUTANEOUS
  Administered 2013-10-31 (×2): 7 [IU] via SUBCUTANEOUS

## 2013-10-28 MED ORDER — ONDANSETRON HCL 4 MG/2ML IJ SOLN
4.0000 mg | Freq: Three times a day (TID) | INTRAMUSCULAR | Status: AC | PRN
  Filled 2013-10-28: qty 2

## 2013-10-28 MED ORDER — ONDANSETRON HCL 4 MG/2ML IJ SOLN
4.0000 mg | INTRAMUSCULAR | Status: AC
  Administered 2013-10-28: 4 mg via INTRAVENOUS
  Filled 2013-10-28: qty 2

## 2013-10-28 MED ORDER — ATORVASTATIN CALCIUM 40 MG PO TABS
40.0000 mg | ORAL_TABLET | Freq: Every day | ORAL | Status: DC
  Administered 2013-10-29 – 2013-10-31 (×3): 40 mg via ORAL
  Filled 2013-10-28 (×3): qty 1

## 2013-10-28 MED ORDER — VANCOMYCIN HCL 10 G IV SOLR
1250.0000 mg | Freq: Two times a day (BID) | INTRAVENOUS | Status: DC
  Administered 2013-10-29 (×2): 1250 mg via INTRAVENOUS
  Filled 2013-10-28 (×3): qty 1250

## 2013-10-28 MED ORDER — SODIUM CHLORIDE 0.9 % IV SOLN
INTRAVENOUS | Status: DC
  Administered 2013-10-28 – 2013-10-30 (×3): via INTRAVENOUS

## 2013-10-28 MED ORDER — QUINAPRIL HCL 10 MG PO TABS
40.0000 mg | ORAL_TABLET | Freq: Every day | ORAL | Status: DC
  Administered 2013-10-29: 40 mg via ORAL
  Filled 2013-10-28 (×3): qty 4

## 2013-10-28 MED ORDER — PIPERACILLIN-TAZOBACTAM 3.375 G IVPB 30 MIN
3.3750 g | Freq: Once | INTRAVENOUS | Status: AC
  Administered 2013-10-28: 3.375 g via INTRAVENOUS
  Filled 2013-10-28: qty 50

## 2013-10-28 MED ORDER — INSULIN GLARGINE 100 UNIT/ML ~~LOC~~ SOLN
25.0000 [IU] | Freq: Every day | SUBCUTANEOUS | Status: DC
  Administered 2013-10-29: 25 [IU] via SUBCUTANEOUS
  Filled 2013-10-28 (×2): qty 0.25

## 2013-10-28 MED ORDER — SODIUM CHLORIDE 0.9 % IV SOLN
2000.0000 mg | Freq: Once | INTRAVENOUS | Status: AC
  Administered 2013-10-29: 2000 mg via INTRAVENOUS
  Filled 2013-10-28: qty 2000

## 2013-10-28 MED ORDER — IOHEXOL 300 MG/ML  SOLN
100.0000 mL | Freq: Once | INTRAMUSCULAR | Status: AC | PRN
  Administered 2013-10-28: 100 mL via INTRAVENOUS

## 2013-10-28 MED ORDER — IOHEXOL 300 MG/ML  SOLN
50.0000 mL | Freq: Once | INTRAMUSCULAR | Status: AC | PRN
  Administered 2013-10-28: 50 mL via ORAL

## 2013-10-28 NOTE — Progress Notes (Signed)
ANTIBIOTIC CONSULT NOTE - INITIAL  Pharmacy Consult for Vancocin and Zosyn Indication: cellulitis  Allergies  Allergen Reactions  . Keflex [Cephalexin] Hives    Denies Airway involvement  . Adhesive [Tape] Rash    Latex  . Neosporin [Neomycin-Bacitracin Zn-Polymyx] Rash    Patient Measurements: Height: 5' 8.11" (173 cm) Weight: 306 lb 7 oz (139 kg) IBW/kg (Calculated) : 64.15  Vital Signs: Temp: 98.8 F (37.1 C) (05/07 2300) Temp src: Oral (05/07 2300) BP: 139/79 mmHg (05/07 2300) Pulse Rate: 82 (05/07 2300)  Labs:  Recent Labs  10/28/13 1305  WBC 13.2*  HGB 13.6  PLT 206  CREATININE 1.18*   Estimated Creatinine Clearance: 76.3 ml/min (by C-G formula based on Cr of 1.18).   Microbiology: No results found for this or any previous visit (from the past 720 hour(s)).  Medical History: Past Medical History  Diagnosis Date  . GOITER, MULTINODULAR 05/13/2007  . DIABETES MELLITUS, WITH NEUROLOGICAL COMPLICATIONS 09/30/8117  . HYPERCHOLESTEROLEMIA 03/20/2010  . DYSLIPIDEMIA 05/13/2007  . DEPRESSION, CHRONIC 10/06/2007  . OBSTRUCTIVE SLEEP APNEA 06/12/2007    uses CPAP  . PERIPHERAL NEUROPATHY 01/12/2009  . HYPERTENSION 03/20/2010  . UNSPECIFIED VENOUS INSUFFICIENCY 05/28/2010  . PSORIASIS 05/28/2010  . BACK PAIN, LUMBAR 11/19/2007  . ANEMIA, PERNICIOUS, HX OF 05/13/2007  . ASYMPTOMATIC POSTMENOPAUSAL STATUS 02/18/2008  . Hx of colonic polyps 12/04/2010  . NASH (nonalcoholic steatohepatitis)   . Cellulitis of leg, right 10/11/2010    Medications:  Prescriptions prior to admission  Medication Sig Dispense Refill  . acetaminophen (TYLENOL) 325 MG tablet Take 650 mg by mouth every 6 (six) hours as needed for headache.      Marland Kitchen amLODipine (NORVASC) 10 MG tablet Take 10 mg by mouth daily.      Marland Kitchen aspirin EC 81 MG tablet Take 1 tablet (81 mg total) by mouth daily.  90 tablet  3  . atorvastatin (LIPITOR) 40 MG tablet Take 1 tablet (40 mg total) by mouth daily.  90 tablet  3  .  gabapentin (NEURONTIN) 300 MG capsule Take 900 mg by mouth 3 (three) times daily.       . insulin aspart protamine- aspart (NOVOLOG MIX 70/30) (70-30) 100 UNIT/ML injection Take 80 units before lunch and 100 units before dinner. Call [phone] with any blood sugar less than 90.      . metFORMIN (GLUCOPHAGE) 1000 MG tablet Take 1 tablet (1,000 mg total) by mouth 2 (two) times daily with a meal.  180 tablet  3  . metoprolol succinate (TOPROL-XL) 50 MG 24 hr tablet Take 1 tablet (50 mg total) by mouth daily. ake with or immediately following a meal  90 tablet  3  . quinapril (ACCUPRIL) 40 MG tablet Take 1 tablet (40 mg total) by mouth daily.  90 tablet  3  . venlafaxine XR (EFFEXOR-XR) 75 MG 24 hr capsule Take 3 capsules (225 mg total) by mouth daily.  270 capsule  3   Scheduled:  . sodium chloride   Intravenous STAT  . [START ON 10/29/2013] amLODipine  10 mg Oral Daily  . [START ON 10/29/2013] aspirin EC  81 mg Oral Daily  . [START ON 10/29/2013] atorvastatin  40 mg Oral Daily  . gabapentin  900 mg Oral TID  . heparin  5,000 Units Subcutaneous 3 times per day  . [START ON 10/29/2013] insulin aspart  0-20 Units Subcutaneous TID WC  . insulin glargine  25 Units Subcutaneous QHS  . [START ON 10/29/2013] metoprolol succinate  50  mg Oral Daily  . [START ON 10/29/2013] piperacillin-tazobactam (ZOSYN)  IV  3.375 g Intravenous Q8H  . [START ON 10/29/2013] quinapril  40 mg Oral Daily  . sodium chloride  3 mL Intravenous Q12H  . [START ON 10/29/2013] vancomycin  1,250 mg Intravenous Q12H  . vancomycin  2,000 mg Intravenous Once  . [START ON 10/29/2013] venlafaxine XR  225 mg Oral Daily   Infusions:  . sodium chloride      Assessment: 58yo female c/o N/V and elevated CBG at home, noted in ED to have erythematous area on LLE warm and tender, negative for DVT, has h/o RLE cellulitis, to begin IV ABX.  Goal of Therapy:  Vancomycin trough level 10-15 mcg/ml  Plan:  Rec'd vanc 1g at ~1500 and Zosyn 3.375g IV at ~1900;  will load with vancomycin 2019m IV then begin vancomycin 1254mIV Q12H and Zosyn 3.375g IV Q8H and monitor CBC, Cx, levels prn.  VeWynona NeatPharmD, BCPS  10/28/2013,11:21 PM

## 2013-10-28 NOTE — Telephone Encounter (Signed)
Spoke with patient's sister Antonieta Loveless concerning patient's recent elevated GBS.  It was running 221-400s in the mornings over the past 2 days and she hasn't been able to eat anything given the nausea and vomiting she is having.  Pt was offered a 2pm appt today but sister insisted that she needed to be seen sooner.  She has been having hot flashes and sweating more than usual.  Informed sister that it would be best if she took patient to ED for this.  Will send message to MD so he is aware. Jazmin Hartsell,CMA

## 2013-10-28 NOTE — ED Provider Notes (Signed)
CSN: [id number]     Arrival date & time 10/28/13  1249 History   First MD Initiated Contact with Patient 10/28/13 1303     Chief Complaint  Patient presents with  . Hyperglycemia  . Shortness of Breath     (Consider location/radiation/quality/duration/timing/severity/associated sxs/prior Treatment) HPI Comments: Patient is a 58 year old female with a history of dyslipidemia, diabetes mellitus, hypertension, and back pain who presents to the emergency department for a variety of complaints. Patient states that 2 days ago she noted she had a blood sugar of 574. Patient states that she took Lantus for this, but is not supposed to have this prescription; she only had one bottle left because the medication was too expensive. She states that this improved her blood sugar to between 200-300 for the next 2 days. She states she normally runs in the 200s. Over the last 48 hours, patient states that she has had anorexia, chills, and sweats intermittently. She also endorses shortness of breath, nausea, dry heaves, and nonbloody emesis x2. Patient states she was confused yesterday and was unable to recall where she was. The symptoms however has resolved and friend at bedside states that patient is acting normally and mentating at baseline. Patient noticed to have an area of redness to her left lower extremity which was warm to touch and tender; she has a history of right lower extremity cellulitis.  Patient denies documented fever, chest pain, loss of consciousness, diarrhea, melena or hematochezia, urinary symptoms, vaginal complaints, new or worsening numbness/tingling. Patient denies a history of abdominal surgeries. She endorses compliance with her diabetes regimen despite her anorexia.  Patient is a 58 y.o. female presenting with hyperglycemia and shortness of breath. The history is provided by the patient. No language interpreter was used.  Hyperglycemia Associated symptoms: abdominal pain, diaphoresis,  fatigue, nausea, shortness of breath and vomiting   Associated symptoms: no dysuria   Shortness of Breath Associated symptoms: abdominal pain, diaphoresis and vomiting     Past Medical History  Diagnosis Date  . GOITER, MULTINODULAR 05/13/2007  . DIABETES MELLITUS, WITH NEUROLOGICAL COMPLICATIONS 6/37/8588  . HYPERCHOLESTEROLEMIA 03/20/2010  . DYSLIPIDEMIA 05/13/2007  . DEPRESSION, CHRONIC 10/06/2007  . OBSTRUCTIVE SLEEP APNEA 06/12/2007    uses CPAP  . PERIPHERAL NEUROPATHY 01/12/2009  . HYPERTENSION 03/20/2010  . UNSPECIFIED VENOUS INSUFFICIENCY 05/28/2010  . PSORIASIS 05/28/2010  . BACK PAIN, LUMBAR 11/19/2007  . ANEMIA, PERNICIOUS, HX OF 05/13/2007  . ASYMPTOMATIC POSTMENOPAUSAL STATUS 02/18/2008  . Hx of colonic polyps 12/04/2010  . NASH (nonalcoholic steatohepatitis)   . Cellulitis of leg, right 10/11/2010   Past Surgical History  Procedure Laterality Date  . Knee arthroscopy  2003    right  . Varicose vein surgery      Remotef  . Tonsillectomy    . Exercise myoview  01/24/2005  . Electrocardiogram  04/16/2006  . Rotator cuff repair  2008    right   Family History  Problem Relation Age of Onset  . Hyperlipidemia Father   . Hypertension Father   . Cirrhosis Father   . Colon cancer Father 57  . Colon cancer Sister 42  . Aneurysm Mother   . Drug abuse Neg Hx   . CAD Neg Hx   . Stomach cancer Neg Hx   . Cerebral aneurysm Mother    History  Substance Use Topics  . Smoking status: Former Smoker -- 2.00 packs/day for 24 years    Types: Cigarettes    Start date: 06/24/1970    Quit date:  08/06/1994  . Smokeless tobacco: Never Used     Comment: [place] Slims Regular  . Alcohol Use: No   OB History   Grav Para Term Preterm Abortions TAB SAB Ect Mult Living                  Review of Systems  Constitutional: Positive for chills, diaphoresis, appetite change (anorexia) and fatigue.  Respiratory: Positive for shortness of breath.   Gastrointestinal: Positive for  nausea, vomiting and abdominal pain. Negative for diarrhea and blood in stool.  Genitourinary: Negative for dysuria and hematuria.  Skin: Positive for color change. Negative for wound.  Neurological: Negative for syncope, weakness and numbness.  All other systems reviewed and are negative.     Allergies  Keflex; Adhesive; and Neosporin  Home Medications   Prior to Admission medications   Medication Sig Start Date End Date Taking? Authorizing Provider  acetaminophen (TYLENOL) 325 MG tablet Take 650 mg by mouth every 6 (six) hours as needed for headache.   Yes Historical Provider, MD  amLODipine (NORVASC) 10 MG tablet Take 10 mg by mouth daily.   Yes Historical Provider, MD  aspirin EC 81 MG tablet Take 1 tablet (81 mg total) by mouth daily. 10/12/12  Yes Marin Olp, MD  atorvastatin (LIPITOR) 40 MG tablet Take 1 tablet (40 mg total) by mouth daily. 06/23/13  Yes Marin Olp, MD  gabapentin (NEURONTIN) 300 MG capsule Take 900 mg by mouth 3 (three) times daily.    Yes Marin Olp, MD  insulin aspart protamine- aspart (NOVOLOG MIX 70/30) (70-30) 100 UNIT/ML injection Take 80 units before lunch and 100 units before dinner. Call [phone] with any blood sugar less than 90. 05/04/13  Yes Vance Gather, MD  metFORMIN (GLUCOPHAGE) 1000 MG tablet Take 1 tablet (1,000 mg total) by mouth 2 (two) times daily with a meal. 10/12/12 11/01/21 Yes Marin Olp, MD  metoprolol succinate (TOPROL-XL) 50 MG 24 hr tablet Take 1 tablet (50 mg total) by mouth daily. ake with or immediately following a meal 10/12/12  Yes Marin Olp, MD  quinapril (ACCUPRIL) 40 MG tablet Take 1 tablet (40 mg total) by mouth daily. 12/24/12  Yes Marin Olp, MD  venlafaxine XR (EFFEXOR-XR) 75 MG 24 hr capsule Take 3 capsules (225 mg total) by mouth daily. 10/12/12  Yes Marin Olp, MD   BP 146/67  Pulse 74  Temp(Src) 98.3 F (36.8 C) (Oral)  Resp 18  SpO2 98%  Physical Exam  Nursing note and vitals  reviewed. Constitutional: She is oriented to person, place, and time. She appears well-developed and well-nourished. No distress.  Nontoxic/nonseptic appearing  HENT:  Head: Normocephalic and atraumatic.  Mouth/Throat: No oropharyngeal exudate.  Oropharynx clear. Dry mm.  Eyes: Conjunctivae and EOM are normal. Pupils are equal, round, and reactive to light. No scleral icterus.  Neck: Normal range of motion.  Cardiovascular: Normal rate, regular rhythm, normal heart sounds and intact distal pulses.   Pulmonary/Chest: Effort normal and breath sounds normal. No respiratory distress. She has no wheezes. She has no rales.  No tachypnea or dyspnea. Chest expansion symmetric.  Abdominal: Soft. She exhibits no mass. There is tenderness. There is no rebound and no guarding.  Morbidly obese abdomen is soft to palpation. Patient has tenderness in her bilateral lower quadrants, right greater than left. Patient has some tenderness appreciated to be at McBurney's point. No peritoneal signs.  Musculoskeletal: Normal range of motion.  Left knee: Normal.       Left ankle: Normal.       Left lower leg: She exhibits tenderness and swelling. She exhibits no bony tenderness, no deformity and no laceration.       Legs: Neurological: She is alert and oriented to person, place, and time. No cranial nerve deficit. Coordination normal.  GCS 15. Patient moves extremities without ataxia. Speech is goal oriented.  Skin: Skin is warm and dry. She is not diaphoretic. There is erythema. No pallor.  Patient has area of erythema to her distal left lower extremity extending from her posterior ankle to her proximal medial calf. Area of erythema it is mildly tender to palpation as well as warm to the touch. No red streaking appreciated. Findings consistent with cellulitis. No erythema, swelling, or tenderness of the left knee. Normal range of motion of left knee and left ankle joint.  Psychiatric: She has a normal mood and  affect. Her behavior is normal.    ED Course  Procedures (including critical care time) Labs Review Labs Reviewed  CBC WITH DIFFERENTIAL - Abnormal; Notable for the following:    WBC 13.2 (*)    RBC 5.19 (*)    Neutrophils Relative % 83 (*)    Neutro Abs 10.9 (*)    Lymphocytes Relative 10 (*)    All other components within normal limits  COMPREHENSIVE METABOLIC PANEL - Abnormal; Notable for the following:    Potassium 3.3 (*)    Chloride 93 (*)    Glucose, Bld 230 (*)    BUN 24 (*)    Creatinine, Ser 1.18 (*)    Albumin 3.2 (*)    Alkaline Phosphatase 155 (*)    GFR calc non Af Amer 49 (*)    GFR calc Af Amer 57 (*)    All other components within normal limits  CBG MONITORING, ED - Abnormal; Notable for the following:    Glucose-Capillary 214 (*)    All other components within normal limits  LIPASE, BLOOD  URINALYSIS, ROUTINE W REFLEX MICROSCOPIC  I-STAT CG4 LACTIC ACID, ED    Imaging Review No results found.   EKG Interpretation   Date/Time:  Thursday Oct 28 2013 12:52:20 EDT Ventricular Rate:  70 PR Interval:  160 QRS Duration: 100 QT Interval:  498 QTC Calculation: 537 R Axis:   39 Text Interpretation:  Sinus rhythm Sinus pause Anteroseptal infarct, age  indeterminate Prolonged QT interval Similar to previous except QT  Confirmed by ZAVITZ  MD, JOSHUA ([id number]) on 10/28/2013 3:50:15 PM      MDM   1. Cellulitis 2. Dehydration 3. Elevated creatinine 4. Abdominal pain   58 year old female presents to the emergency department for a variety of complaints including hyperglycemia, which has resolved, shortness of breath, fatigue, anorexia, and nausea/vomiting. Physical exam significant for a cellulitis to the left lower extremity as well as abdominal tenderness to palpation. Imaging order to rule out necrotizing fasciitis/sub-Q gas, though my suspicion for this is low given leukocytosis of 13.2 and normal lactate.   Chest x-ray also ordered for further evaluation  of shortness of breath. Patient satting between 94-100% on room air during my encounter with her in the exam room. No tachypnea, dyspnea, retractions, or accessory muscle use noted. Patient also endorsing hyperglycemia. This seems to have resolved and CBG on arrival to 14. Patient has a slight metabolic acidosis; however, believe this to be secondary to her infection rather than DKA.  Physical exam noted to have abdominal tenderness  to palpation. No evidence of acute surgical abdomen, however, difficult to assess given body habitus. Will obtain CT abdomen and pelvis for further evaluation of symptoms.  IV fluids ordered as patient appears dehydrated and creatinine function mildly elevated. Also ordered vancomycin be started for cellulitis. Patient has a history of this for which she was admitted 6 months ago. Patient signed out to Jeannett Senior, PA-C at end of shift who will follow patient care and disposition appropriately.    Filed Vitals:   10/28/13 1251 10/28/13 1302 10/28/13 1309  BP: 146/67    Pulse: 74 77 74  Temp: 98.3 F (36.8 C)    TempSrc: Oral    Resp: 25 36 18  SpO2: 100% 99% 98%     Antonietta Breach, PA-C 10/28/13 1556

## 2013-10-28 NOTE — ED Notes (Signed)
Patient transported to CT 

## 2013-10-28 NOTE — ED Notes (Signed)
[facility] present to transport patient to [facility] 

## 2013-10-28 NOTE — ED Notes (Signed)
[facility] called and made aware of need for transport Per [facility] staff, there will be a 2 hour delay for transport Patient is aware

## 2013-10-28 NOTE — H&P (Signed)
[facility] Admission History and Physical Service Pager: [phone]  Patient name: [name] Medical record number: [id number] Date of birth: October 13, 1955 Age: 58 y.o. Gender: female  Primary Care Provider: Garret Reddish, MD Consultants: None  Code Status: Full   Chief Complaint: lower extremity swelling   Assessment and Plan: IYLAH DWORKIN is a 58 y.o. female presenting with LLE erythema/edema c/w cellulitis.  PMH is significant for poorly controlled diabetes, OSA/obesity, HTN, remote CAD, recurrent LE cellultitis and stage II chronic kidney disease.  # Recurrent Cellulitis, LE in diabetic pt - Pt with hospitalization in November 2014 for similar presentation, but on R extremity.   - Admit to telemetry, vitals per unit - Will cover with Vanc/Zosyn per IDSA 2014 guidelines for soft tissue infection in decreased immunity host (Diabetic).  Pt previous allergy to keflex, however, she unsure if she ever had a rash or not to this.  Will go ahead and give a dose and if reaction, switch to IV Levaquin for gram negative and Pseudomonas coverage and can consider Flagyl for anaerobes.  Has taken PO augmentin w/o problems in the past  - Will draw BCx, however, after ABx given  - Repeat AM CBC and BMET  - F/U LE duplex on L  - Consider MRI to r/o underlying infxn   # Dyspnea - Could very well be obesity hypoventilation syndrome vs MSK in nature due to cough.  CXR unrevealing.  Echo in 2012 showing EF 60% w/ RV enlargement.  Expect this is 2/2 to her obesity hypoventilation and OSA.   - Negative LLE Duplex.  Well's score of 0, consider D-Dimer if persistent SOB or hypoxemia.  If positive, would evaluate further with CTA - O2 PRN for sats > 92% - F/U Troponin and BNP  - Consider repeat echo to evaluate for RV/LV dysfunction and Pulmonary HTN   # Abdominal Pain - CT abdomen negative for acute process - Continue to follow exams, benign on initial   # Hx of CAD - 30 %  stenosis of LAD and 25% of RCA stenosis on cath from 02/15/2005.  Heart Score 1, TIMI score 2.  - Will get troponin x 3 and repeat EKG in AM, however her chest pain more c/w MSK in nature 2/2 cough.  With her RF and DM, could be masked.  - Continue home Beta Blocker/ASA/Lipitor/ACE - May need repeat stress testing in outpatient vs Lexiscan Myoview if inpatient w/u is negative for acute event.    # HTN  - Stable, continue home medications (Norvasc/Accupril)   # Hyperlipidemia  -Continue home Lipitor   # DM II, poorly controlled - Last A1C on 4/15 showing 12.3.  Hold home Insulin-aspart, and glucophage  - Will start Lantus at 25 U tonight and resistant SSI.  However, with her home insulin regimen and concurrent infection, expect she will need large increase in her Lantus dosing  #Diabetic Neuropathy - No evidence of ulcerations on her feet -Continue home neurontin 900 mg TID  #OSA - Continue home CPAP   #Hx of Depression -Continue home Effexor   # CKD Stage II -baseline somewhere between 0.8 to 1.0.  1.18 on admission today  - Expect some renal insult 2/2 contrast and IV vancomycin - Monitor creatinine daily - IVF @ 100 cc/hr  - Limit nephrotoxic agents   FEN/GI: NS @ 100 cc/hr, Carb modified diet.  Prophylaxis: SQ heparin   Disposition: Telemetry pending further evaluation. PT/OT consulted   History of Present  Illness: [name] is a 58 y.o. female presenting with LLE erythema.  PMH is significant for poorly controlled diabetes, OSA/obesity, HTN, remote CAD, recurrent LE cellultitis and stage II chronic kidney disease.   Pt states she was in her normal state of health up until three days ago when she noticed her sugars were extremely elevated (578) and became alarmed 2/2 this.  She started to have some generalized weakness, nausea, two episodes of Non bloody/non bilious vomiting during that time and one episode of non bloody diarrhea.  As well, she was limited to being in bed due  to her generalized malaise and noticed some increased cough during that time, non productive.  She describes some chest pain a/w her cough, but no substernal pressure, not worse with walking or movement and not relieved by rest.  She sleeps with two pillows due to comfort and unsure if she could lay flat without becoming short of breath.  She is also unsure if she has any PND.  Denies increase in LE edema recently, denies HA, blurred vision, diplopia, recent sick contacts, travel, numbness or tingling down her arms, weight loss.  Pt called the ambulance today due to prolonged fatigue and elevated sugars (250s today) prior to be transported to the ED.   In the ED pt had extensive evaluation including CMET showing hypokalemia to 3.3, baseline creatinine 1.18 (baseline 0.8-1.0), CBC w/ leukocytosis to 13.2 w/ left shift, normal lactic acid at 1.68, troponin < 0.30, Lower extremity x-rays to r/o subq gas which were negative, EKG unchanged from previous except QTc 537, CXR no acute disease, CT abdomen which was negative for acute process.  She was given one dose of vancomycin   She has a remote smoking history of 1-2 ppd for 20-30 yrs but quit over 15 yrs ago.  No alcohol use and no IVDA.  She is on disability due to her chronic conditions.    Review Of Systems: Per HPI   Patient Active Problem List   Diagnosis Date Noted  . Cellulitis of left lower leg 10/28/2013  . Hyperlipidemia 08/26/2013  . CAD (coronary artery disease), native coronary artery 08/26/2013  . Morbid obesity 08/26/2013  . OSA on CPAP 08/26/2013  . Anemia 06/19/2012  . CKD (chronic kidney disease), stage II 06/04/2012  . Low back pain 03/05/2012  . At high risk for falls 06/12/2011  . Osteoarthritis of knee 04/23/2011  . UNSPECIFIED VENOUS INSUFFICIENCY 05/28/2010  . PSORIASIS 05/28/2010  . HYPERCHOLESTEROLEMIA 03/20/2010  . HYPERTENSION 03/20/2010  . DERMATITIS, HANDS 05/02/2009  . PERIPHERAL NEUROPATHY 01/12/2009  . DM  (diabetes mellitus), type 2, uncontrolled 02/18/2008  . DEPRESSION, CHRONIC 10/06/2007  . OBSTRUCTIVE SLEEP APNEA 06/12/2007  . GOITER, MULTINODULAR 05/13/2007   Past Medical History: Past Medical History  Diagnosis Date  . GOITER, MULTINODULAR 05/13/2007  . DIABETES MELLITUS, WITH NEUROLOGICAL COMPLICATIONS 10/07/6061  . HYPERCHOLESTEROLEMIA 03/20/2010  . DYSLIPIDEMIA 05/13/2007  . DEPRESSION, CHRONIC 10/06/2007  . OBSTRUCTIVE SLEEP APNEA 06/12/2007    uses CPAP  . PERIPHERAL NEUROPATHY 01/12/2009  . HYPERTENSION 03/20/2010  . UNSPECIFIED VENOUS INSUFFICIENCY 05/28/2010  . PSORIASIS 05/28/2010  . BACK PAIN, LUMBAR 11/19/2007  . ANEMIA, PERNICIOUS, HX OF 05/13/2007  . ASYMPTOMATIC POSTMENOPAUSAL STATUS 02/18/2008  . Hx of colonic polyps 12/04/2010  . NASH (nonalcoholic steatohepatitis)   . Cellulitis of leg, right 10/11/2010   Past Surgical History: Past Surgical History  Procedure Laterality Date  . Knee arthroscopy  2003    right  . Varicose vein surgery  Remotef  . Tonsillectomy    . Exercise myoview  01/24/2005  . Electrocardiogram  04/16/2006  . Rotator cuff repair  2008    right   Social History: History  Substance Use Topics  . Smoking status: Former Smoker -- 2.00 packs/day for 24 years    Types: Cigarettes    Start date: 06/24/1970    Quit date: 08/06/1994  . Smokeless tobacco: Never Used     Comment: [place] Slims Regular  . Alcohol Use: No   Family History: Family History  Problem Relation Age of Onset  . Hyperlipidemia Father   . Hypertension Father   . Cirrhosis Father   . Colon cancer Father 49  . Colon cancer Sister 17  . Aneurysm Mother   . Drug abuse Neg Hx   . CAD Neg Hx   . Stomach cancer Neg Hx   . Cerebral aneurysm Mother    Allergies and Medications: Allergies  Allergen Reactions  . Keflex [Cephalexin] Hives    Denies Airway involvement  . Adhesive [Tape] Rash    Latex  . Neosporin [Neomycin-Bacitracin Zn-Polymyx] Rash   No  current facility-administered medications on file prior to encounter.   Current Outpatient Prescriptions on File Prior to Encounter  Medication Sig Dispense Refill  . amLODipine (NORVASC) 10 MG tablet Take 10 mg by mouth daily.      Marland Kitchen aspirin EC 81 MG tablet Take 1 tablet (81 mg total) by mouth daily.  90 tablet  3  . atorvastatin (LIPITOR) 40 MG tablet Take 1 tablet (40 mg total) by mouth daily.  90 tablet  3  . gabapentin (NEURONTIN) 300 MG capsule Take 900 mg by mouth 3 (three) times daily.       . insulin aspart protamine- aspart (NOVOLOG MIX 70/30) (70-30) 100 UNIT/ML injection Take 80 units before lunch and 100 units before dinner. Call [phone] with any blood sugar less than 90.      . metFORMIN (GLUCOPHAGE) 1000 MG tablet Take 1 tablet (1,000 mg total) by mouth 2 (two) times daily with a meal.  180 tablet  3  . metoprolol succinate (TOPROL-XL) 50 MG 24 hr tablet Take 1 tablet (50 mg total) by mouth daily. ake with or immediately following a meal  90 tablet  3  . quinapril (ACCUPRIL) 40 MG tablet Take 1 tablet (40 mg total) by mouth daily.  90 tablet  3  . venlafaxine XR (EFFEXOR-XR) 75 MG 24 hr capsule Take 3 capsules (225 mg total) by mouth daily.  270 capsule  3  . [DISCONTINUED] Alum & Mag Hydroxide-Simeth (MAGIC MOUTHWASH W/LIDOCAINE) SOLN Take 5 mLs by mouth 3 (three) times daily as needed.  100 mL  0    Objective: BP 145/72  Pulse 93  Temp(Src) 98.2 F (36.8 C) (Oral)  Resp 18  SpO2 95% Exam: General: NAD, comfortable in bed, obese  HEENT: [place]/AT, MMM, EOMI B/L  Neck: No JVD, no LAD  Cardiovascular: RRR, no murmurs appreciated  Respiratory: CTAB, no wheezes  Abdomen: Soft/NT/ND, NABS, no HSM  Extremities: + 2 pulses B/L LE, trace pitting edema B/L  Skin: LLE erythema from foot to mid shin/posterior calf, TTP LLE, - Homan sign B/L, chronic venous changes on R side  Neuro: No focal Deficits   Labs and Imaging: CBC BMET   Recent Labs Lab 10/28/13 1305  WBC 13.2*   HGB 13.6  HCT 41.0  PLT 206    Recent Labs Lab 10/28/13 1305  NA 137  K 3.3*  CL 93*  CO2 23  BUN 24*  CREATININE 1.18*  GLUCOSE 230*  CALCIUM 9.5     EKG - NSR, unchanged from previous except QTc 537 ms   DG Ankle - No acute changes or gas noted   CXR - No acute Cardiopulmonary disease  CT abdomen Pelvis   IMPRESSION:  No acute intra-abdominal or pelvic process.  Moderate fat containing periumbilical hernia.  Nolon Rod, DO 10/28/2013, 5:54 PM PGY-2, [facility] pager: [phone], text pages welcome

## 2013-10-28 NOTE — ED Notes (Signed)
Patient placed on bedpan, attempting to provide urine sample.

## 2013-10-28 NOTE — Progress Notes (Signed)
Left lower extremity venous duplex completed.  Left:  No evidence of DVT, superficial thrombosis, or Baker's cyst.  Right:  Negative for DVT in the common femoral vein.  

## 2013-10-28 NOTE — ED Notes (Signed)
Report to [place] at [facility] transfer delay at present time

## 2013-10-28 NOTE — ED Notes (Signed)
Bed: GM71 Expected date:  Expected time:  Means of arrival:  Comments: EMS- 58 yr old, weakness

## 2013-10-28 NOTE — ED Provider Notes (Signed)
Medical screening examination/treatment/procedure(s) were performed by non-physician practitioner and as supervising physician I was immediately available for consultation/collaboration.   EKG Interpretation   Date/Time:  Thursday Oct 28 2013 12:52:20 EDT Ventricular Rate:  70 PR Interval:  160 QRS Duration: 100 QT Interval:  498 QTC Calculation: 537 R Axis:   39 Text Interpretation:  Sinus rhythm Sinus pause Anteroseptal infarct, age  indeterminate Prolonged QT interval Similar to previous except QT  Confirmed by Reather Converse  MD, Edward Trevino ([id number]) on 10/28/2013 3:50:15 PM        Mariea Clonts, MD 10/28/13 [phone]

## 2013-10-28 NOTE — ED Notes (Signed)
Pt attempted to urinate in bed pan but was unsuccessful at this time

## 2013-10-28 NOTE — ED Notes (Addendum)
Per EMS patient has had difficulty stabilizing high blood sugar for two days, nausea, dry heaves, has been unable to take medications today including neuropathy medication. Pt states she has not eaten for 2 days, has had SOB, was confused yesterday, unable to tell where she was, states confusion has resolved.

## 2013-10-29 ENCOUNTER — Encounter (HOSPITAL_COMMUNITY): Payer: Self-pay | Admitting: *Deleted

## 2013-10-29 DIAGNOSIS — Z6841 Body Mass Index (BMI) 40.0 and over, adult: Secondary | ICD-10-CM | POA: Diagnosis not present

## 2013-10-29 DIAGNOSIS — L03119 Cellulitis of unspecified part of limb: Principal | ICD-10-CM

## 2013-10-29 DIAGNOSIS — E1165 Type 2 diabetes mellitus with hyperglycemia: Secondary | ICD-10-CM

## 2013-10-29 DIAGNOSIS — L02419 Cutaneous abscess of limb, unspecified: Secondary | ICD-10-CM | POA: Diagnosis not present

## 2013-10-29 DIAGNOSIS — IMO0001 Reserved for inherently not codable concepts without codable children: Secondary | ICD-10-CM

## 2013-10-29 DIAGNOSIS — R7309 Other abnormal glucose: Secondary | ICD-10-CM

## 2013-10-29 DIAGNOSIS — E1142 Type 2 diabetes mellitus with diabetic polyneuropathy: Secondary | ICD-10-CM | POA: Diagnosis not present

## 2013-10-29 DIAGNOSIS — E662 Morbid (severe) obesity with alveolar hypoventilation: Secondary | ICD-10-CM | POA: Diagnosis not present

## 2013-10-29 LAB — BASIC METABOLIC PANEL
BUN: 20 mg/dL (ref 6–23)
CHLORIDE: 98 meq/L (ref 96–112)
CO2: 22 mEq/L (ref 19–32)
Calcium: 8.7 mg/dL (ref 8.4–10.5)
Creatinine, Ser: 1.16 mg/dL — ABNORMAL HIGH (ref 0.50–1.10)
GFR calc non Af Amer: 50 mL/min — ABNORMAL LOW (ref >90)
GFR, EST AFRICAN AMERICAN: 59 mL/min — AB (ref >90)
Glucose, Bld: 185 mg/dL — ABNORMAL HIGH (ref 70–99)
POTASSIUM: 3.4 meq/L — AB (ref 3.7–5.3)
Sodium: 137 mEq/L (ref 137–147)

## 2013-10-29 LAB — CBC WITH DIFFERENTIAL/PLATELET
BASOS PCT: 0 % (ref 0–1)
Basophils Absolute: 0 10*3/uL (ref 0.0–0.1)
EOS ABS: 0.1 10*3/uL (ref 0.0–0.7)
Eosinophils Relative: 2 % (ref 0–5)
HCT: 38.5 % (ref 36.0–46.0)
Hemoglobin: 12.2 g/dL (ref 12.0–15.0)
Lymphocytes Relative: 15 % (ref 12–46)
Lymphs Abs: 1.1 10*3/uL (ref 0.7–4.0)
MCH: 25.4 pg — ABNORMAL LOW (ref 26.0–34.0)
MCHC: 31.7 g/dL (ref 30.0–36.0)
MCV: 80.2 fL (ref 78.0–100.0)
Monocytes Absolute: 0.8 10*3/uL (ref 0.1–1.0)
Monocytes Relative: 11 % (ref 3–12)
NEUTROS ABS: 5.4 10*3/uL (ref 1.7–7.7)
NEUTROS PCT: 72 % (ref 43–77)
Platelets: 199 10*3/uL (ref 150–400)
RBC: 4.8 MIL/uL (ref 3.87–5.11)
RDW: 14.9 % (ref 11.5–15.5)
WBC: 7.5 10*3/uL (ref 4.0–10.5)

## 2013-10-29 LAB — CREATININE, SERUM
CREATININE: 1.1 mg/dL (ref 0.50–1.10)
GFR, EST AFRICAN AMERICAN: 62 mL/min — AB (ref >90)
GFR, EST NON AFRICAN AMERICAN: 54 mL/min — AB (ref >90)

## 2013-10-29 LAB — CBC
HEMATOCRIT: 40 % (ref 36.0–46.0)
Hemoglobin: 12.7 g/dL (ref 12.0–15.0)
MCH: 25.4 pg — ABNORMAL LOW (ref 26.0–34.0)
MCHC: 31.8 g/dL (ref 30.0–36.0)
MCV: 80 fL (ref 78.0–100.0)
Platelets: 192 10*3/uL (ref 150–400)
RBC: 5 MIL/uL (ref 3.87–5.11)
RDW: 14.8 % (ref 11.5–15.5)
WBC: 10.3 10*3/uL (ref 4.0–10.5)

## 2013-10-29 LAB — GLUCOSE, CAPILLARY
GLUCOSE-CAPILLARY: 172 mg/dL — AB (ref 70–99)
Glucose-Capillary: 252 mg/dL — ABNORMAL HIGH (ref 70–99)
Glucose-Capillary: 279 mg/dL — ABNORMAL HIGH (ref 70–99)
Glucose-Capillary: 343 mg/dL — ABNORMAL HIGH (ref 70–99)

## 2013-10-29 LAB — TROPONIN I
Troponin I: <0.3 ng/mL (ref <0.30)
Troponin I: <0.3 ng/mL (ref <0.30)

## 2013-10-29 LAB — PRO B NATRIURETIC PEPTIDE: Pro B Natriuretic peptide (BNP): 537.1 pg/mL — ABNORMAL HIGH (ref 0–125)

## 2013-10-29 LAB — TSH: TSH: 8.58 u[IU]/mL — AB (ref 0.350–4.500)

## 2013-10-29 MED ORDER — INSULIN GLARGINE 100 UNIT/ML ~~LOC~~ SOLN
35.0000 [IU] | Freq: Every day | SUBCUTANEOUS | Status: DC
  Administered 2013-10-29: 35 [IU] via SUBCUTANEOUS
  Filled 2013-10-29 (×2): qty 0.35

## 2013-10-29 MED ORDER — SULFAMETHOXAZOLE-TMP DS 800-160 MG PO TABS
1.0000 | ORAL_TABLET | Freq: Two times a day (BID) | ORAL | Status: DC
  Administered 2013-10-29 – 2013-10-31 (×4): 1 via ORAL
  Filled 2013-10-29 (×5): qty 1

## 2013-10-29 MED ORDER — LEVOFLOXACIN 750 MG PO TABS
750.0000 mg | ORAL_TABLET | Freq: Every day | ORAL | Status: DC
  Administered 2013-10-29: 750 mg via ORAL
  Filled 2013-10-29 (×2): qty 1

## 2013-10-29 MED ORDER — POTASSIUM CHLORIDE CRYS ER 20 MEQ PO TBCR
40.0000 meq | EXTENDED_RELEASE_TABLET | Freq: Once | ORAL | Status: AC
  Administered 2013-10-29: 40 meq via ORAL
  Filled 2013-10-29: qty 2

## 2013-10-29 NOTE — Progress Notes (Signed)
Inpatient Diabetes Program Recommendations  AACE/ADA: New Consensus Statement on Inpatient Glycemic Control (2013)  Target Ranges:  Prepandial:   less than 140 mg/dL      Peak postprandial:   less than 180 mg/dL (1-2 hours)      Critically ill patients:  140 - 180 mg/dL   Reason for Visit: Diabetes Consult  Diabetes history: DM2 Outpatient Diabetes medications: 70/30 80 units acL, 100 units acDinner, metformin 1000 mg bid Current orders for Inpatient glycemic control: Lantus 35 units QHS and Novolog resistant Q4H  Pt states she is having bariatric surgery on 5/19 at [facility]. Has been on Lantus and Novolog in the past, but states she could not afford it and was changed to 70/30. Monitors blood sugars at home and usually they run in the 200 - 300s. Also requests prescription for glucose strips and lancets.   Inpatient Diabetes Program Recommendations Insulin - Basal: Increase Lantus to 35 units QHS - continue to titrate until FBS < 180 mg/dL Insulin - Meal Coverage: Consider addition of Novolog 10 units tidwc for meal coverage insulin if pt eats >50% meal HgbA1C: 12.3% - uncontrolled  Note: Lengthy discussion regarding glucose control and importance of reducing HgbA1C.  Pt  Looking forward to bariatric surgery in 2 weeks. To f/u with PCP for glucose management.  Thank you. Lorenda Peck, RD, LDN, CDE Inpatient Diabetes Coordinator [phone]

## 2013-10-29 NOTE — H&P (Signed)
FMTS Attending Admit Note Patient seen and examined by me, discussed with Dr Awanda Mink and I agree with his A/P.  Patient's Left Lower Extremity edema/erythema is markedly improved per her report; she reports to feeling somewhat fatigued but overall better than yesterday.  She presented initially because of widely fluctuating CBGs (to the 500's), her usual home glucose readings are closer to 200.  She denies chest pain, cough, diarrhea/vomiting, or dysuria. She has had a negative lower extremity doppler US. Exam Alert, no apparent distress. EXTS: left leg with trace erythema along shin and calf, about 2/3 up the shin.  Full active ROM ankle and knee bilaterally. Great toe (L) with appearance of onychomycosis and recent trauma to the nailbed; no skin maceration between digits.  Palpable dp pulses bilat.  A/P: Patient with DM and soft tissue infection in LLE; clinically improving on Vanc/Zosyn.  Would continue to monitor for improvement on IV abx today, transition to oral abx (Bactrim DS 2 tabs BID) or doxycycline later this evening for MRSA coverage, for possible discharge tomorrow.  Watch for blood culture results, although unlikely to turn positive. Patient is primary caregiver for her husband who has progressive MS, requires 24-hour care at home. Her son and daughter are helping out while she is in the hospital. Dalbert Mayotte, MD

## 2013-10-29 NOTE — Progress Notes (Signed)
Utilization review completed. Catherine Cubero, RN, BSN. 

## 2013-10-29 NOTE — Progress Notes (Signed)
Family Medicine Teaching Service Daily Progress Note Intern Pager: [phone]  Patient name: [name] Medical record number: [id number] Date of birth: [birth year] Age: 58 y.o. Gender: female  Primary Care Provider: Garret Reddish, MD Consultants: none Code Status: full   Pt Overview and Major Events to Date:  5/7: admitted for lower extremity swelling   ABX/Culture Vanc 5/7>> Zosyn 5/7>>  Blood culture 5/7  Assessment and Plan: [name] is a 58 y.o. female presenting with LLE erythema/edema c/w cellulitis. PMH is significant for poorly controlled diabetes, OSA/obesity, HTN, remote CAD, recurrent LE cellultitis and stage II chronic kidney disease.   # Recurrent Cellulitis, LE in diabetic pt - improving today. Will transition to bactrim or doxycycline once she has received 24 hours of IV ABX tonight around 1900.   - Vanc  -  BCx, however, after ABx given  -  LE duplex on L: no DVT.   # Dyspnea - Improved today and most likely 2/2 to her obesity hypoventilation and OSA.  - Negative LLE Duplex.  - BNP 537 - O2 PRN for sats > 92%   # Abdominal Pain: improved today. Lipase normal and lactic acid normal  - CT abdomen negative for acute process  - Continue to follow exams, benign on initial   # Hx of CAD - 30 % stenosis of LAD and 25% of RCA stenosis on cath from 02/15/2005. Heart Score 1, TIMI score 2.  - troponin (-) x 3 .  - Continue home Beta Blocker/ASA/Lipitor/ACEi  #Prolonged Qtc: observed on initial EKG, resolved with am EKG on 5/8 - EKG: sinus rhythm with occasional PVC, QTc 451  # HTN:  Stable,  - continue home medications (Norvasc/Accupril)   # Hyperlipidemia: stable  -Continue home Lipitor   # DM II, poorly controlled - Last A1C on 4/15 showing 12.3. Hold home Insulin-aspart, and glucophage  - insulin 35 units  - Severe SSI  - CBG's   #Diabetic Neuropathy - No evidence of ulcerations on her feet  -Continue home neurontin 900 mg TID   #OSA  -  Continue home CPAP   #Hx of Depression  -Continue home Effexor   # CKD Stage II -baseline somewhere between 0.8 to 1.0. 1.18 on admission today  - Expect some renal insult 2/2 contrast and IV vancomycin  - Monitor creatinine daily  - IVF @ 100 cc/hr  - Limit nephrotoxic agents   FEN/GI: NS @ 100 cc/hr, Carb modified diet.  Prophylaxis: SQ heparin   Disposition: pending improvement   Subjective: Patient feeling well today. She doesn't have any complaints.   Objective: Temp:  [98.1 F (36.7 C)-98.8 F (37.1 C)] 98.3 F (36.8 C) (05/08 1316) Pulse Rate:  [64-93] 65 (05/08 1316) Resp:  [13-21] 18 (05/08 1316) BP: (98-147)/(51-79) 113/66 mmHg (05/08 1316) SpO2:  [91 %-97 %] 91 % (05/08 1316) Weight:  [306 lb 7 oz (139 kg)] 306 lb 7 oz (139 kg) (05/07 2300) Physical Exam: General: NAD, comfortable in bed, obese  Neck: No JVD, no LAD  Cardiovascular: RRR, no murmurs appreciated  Respiratory: CTAB, no wheezes  Abdomen: Soft/NT/ND, NABS, no HSM  Extremities: + 2 pulses B/L LE, trace pitting edema B/L  Skin: LLE erythema from foot to mid shin/posterior calf, TTP LLE,  chronic venous changes on R side  Neuro: No focal Deficits    Laboratory:  Recent Labs Lab 10/28/13 0015 10/28/13 1305 10/29/13 0716  WBC 10.3 13.2* 7.5  HGB 12.7 13.6 12.2  HCT 40.0 41.0  38.5  PLT 192 206 199    Recent Labs Lab 10/28/13 0015 10/28/13 1305 10/29/13 0716  NA  --  137 137  K  --  3.3* 3.4*  CL  --  93* 98  CO2  --  23 22  BUN  --  24* 20  CREATININE 1.10 1.18* 1.16*  CALCIUM  --  9.5 8.7  PROT  --  7.5  --   BILITOT  --  0.6  --   ALKPHOS  --  155*  --   ALT  --  12  --   AST  --  17  --   GLUCOSE  --  230* 185*    Recent Labs Lab 10/28/13 1253 10/28/13 1648 10/28/13 2342 10/29/13 0741 10/29/13 1150  GLUCAP 214* 207* 199* 172* 252*    Recent Labs Lab 10/28/13 0015  PROBNP 537.1*       Recent Labs Lab 10/28/13 0015 10/28/13 1619 10/29/13 0716  10/29/13 1030  TROPONINI <0.30 <0.30 <0.30 <0.30     EKG - NSR, unchanged from previous except QTc 537 ms    Imaging/Diagnostic Tests: DG Ankle - No acute changes or gas noted   CXR - No acute Cardiopulmonary disease   CT abdomen Pelvis  IMPRESSION:  No acute intra-abdominal or pelvic process.  Moderate fat containing periumbilical hernia.   Rosemarie Ax, MD 10/29/2013, 2:25 PM PGY-1, [place] Intern pager: [phone], text pages welcome

## 2013-10-29 NOTE — Progress Notes (Signed)
Nutrition Brief Note  Patient identified on the Malnutrition Screening Tool (MST) Report for recent weight lost without trying and eating poorly because of a decreased appetite.  Wt Readings from Last 15 Encounters:  10/28/13 306 lb 7 oz (139 kg)  10/06/13 306 lb 9.6 oz (139.073 kg)  08/26/13 311 lb (141.069 kg)  07/05/13 305 lb (138.347 kg)  07/05/13 305 lb (138.347 kg)  05/01/13 320 lb 11.2 oz (145.469 kg)  03/26/13 312 lb (141.522 kg)  03/12/13 311 lb 3.2 oz (141.159 kg)  03/01/13 317 lb (143.79 kg)  02/04/13 322 lb (146.058 kg)  01/28/13 313 lb 6.4 oz (142.157 kg)  01/06/13 313 lb 12.8 oz (142.339 kg)  12/24/12 325 lb (147.419 kg)  12/18/12 318 lb (144.244 kg)  10/22/12 319 lb (144.697 kg)    Body mass index is 46.44 kg/(m^2). Patient meets criteria for Obesity Class III based on current BMI.   Current diet order is Carbohydrate Modified.  Patient reports her appetite is much better. Labs and medications reviewed.   No nutrition interventions warranted at this time. If nutrition issues arise, please consult RD.   Arthur Holms, RD, LDN Pager #: [phone] After-Hours Pager #: [phone]

## 2013-10-29 NOTE — Progress Notes (Signed)
OT Cancellation Note  Patient Details Name: [name] MRN: [id number] DOB: 03-11-1956   Cancelled Treatment:    Reason Eval/Treat Not Completed: OT screened, no needs identified, will sign off.   10/29/2013 Luther [name] OTR/L Pager [phone] Office [phone]

## 2013-10-29 NOTE — Progress Notes (Signed)
FMTS Attending Note  The plan of care was discussed with the resident team. I agree with the assessment and plan as documented by the resident.   Transition to PO antibiotics after 24 hours of IV. As patient is a diabetic would consider coverage PO coverage for pseudomonas however given her prolonged QTc cannot use Flouroquinolones, therefore transition to PO Bactrim and observe for 24 hours, if patient worsens she will need IV antibiotics   Dossie Arbour MD

## 2013-10-29 NOTE — Evaluation (Signed)
Physical Therapy Evaluation/ Discharge Patient Details Name: [name] MRN: [id number] DOB: November 28, 1955 Today's Date: 10/29/2013   History of Present Illness  Pt admitted to [facility] with hyperglycemia and cellulitis LLE  Clinical Impression  Pt very pleasant, moving well but clearly overwhelmed with caring for spouse at home. Pt has been neglecting her diet, health and exercise due to her report of very demanding spouse who is total assist for care at home. Pt educated for setting a schedule to be able to meet her and spouses needs with understanding of allotted time for her health. Pt educated for diet, need for increased exercise and ways to make that happen as well as stress relief. Pt would greatly benefit from [facility] for spouse to reduce her burden and assist with her own health care. Pt educated for checking her feet daily due to neuropathy. Pt pleased and relieved after education and session and states she will follow through with education. Pt currently at baseline, all education completed and no further acute needs. Will sign off with pt agreement and recommend daily ambulation with staff.     Follow Up Recommendations No PT follow up    Equipment Recommendations  None recommended by PT    Recommendations for Other Services       Precautions / Restrictions Precautions Precautions: None Restrictions Weight Bearing Restrictions: No      Mobility  Bed Mobility Overal bed mobility: Modified Independent                Transfers Overall transfer level: Modified independent                  Ambulation/Gait Ambulation/Gait assistance: Modified independent (Device/Increase time) Ambulation Distance (Feet): 500 Feet Assistive device: None Gait Pattern/deviations: Step-through pattern   Gait velocity interpretation: Below normal speed for age/gender General Gait Details: pt with standing rest halfway of grossly 4 min  Stairs            Wheelchair  Mobility    Modified Rankin (Stroke Patients Only)       Balance                                             Pertinent Vitals/Pain No pain    Home Living Family/patient expects to be discharged to:: Private residence Living Arrangements: Spouse/significant other Available Help at Discharge: Available PRN/intermittently;Family Type of Home: House Home Access: Ramped entrance     Home Layout: One level Home Equipment: Clinical cytogeneticist - 2 wheels;Grab bars - toilet Additional Comments: 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 House adapted by independent living as [employer] and she had equipment she could use if needed from him. Hoyer lift at home for transferring him bed to and from scooter    Prior Function Level of Independence: Independent               Hand Dominance        Extremity/Trunk Assessment   Upper Extremity Assessment: Generalized weakness           Lower Extremity Assessment: Generalized weakness      Cervical / Trunk Assessment: Normal  Communication   Communication: No difficulties  Cognition Arousal/Alertness: Awake/alert Behavior During Therapy: WFL for tasks assessed/performed Overall Cognitive Status: Within Functional Limits for tasks assessed  General Comments      Exercises        Assessment/Plan    PT Assessment Patent does not need any further PT services  PT Diagnosis     PT Problem List    PT Treatment Interventions     PT Goals (Current goals can be found in the Care Plan section) Acute Rehab PT Goals PT Goal Formulation: No goals set, d/c therapy    Frequency     Barriers to discharge        Co-evaluation               End of Session   Activity Tolerance: Patient tolerated treatment well Patient left: in chair;with call bell/phone within reach Nurse Communication: Mobility status         Time: 5366-4403 PT Time  Calculation (min): 32 min   Charges:   PT Evaluation $Initial PT Evaluation Tier I: 1 Procedure PT Treatments $Self Care/Home Management: 23-37   PT G Codes:          Danaysha Kirn B Asencion Guisinger 10/29/2013, 11:02 AM Elwyn Reach, [place]

## 2013-10-30 LAB — GLUCOSE, CAPILLARY
GLUCOSE-CAPILLARY: 261 mg/dL — AB (ref 70–99)
Glucose-Capillary: 239 mg/dL — ABNORMAL HIGH (ref 70–99)
Glucose-Capillary: 266 mg/dL — ABNORMAL HIGH (ref 70–99)
Glucose-Capillary: 282 mg/dL — ABNORMAL HIGH (ref 70–99)

## 2013-10-30 LAB — BASIC METABOLIC PANEL
BUN: 30 mg/dL — AB (ref 6–23)
CO2: 20 mEq/L (ref 19–32)
CREATININE: 1.48 mg/dL — AB (ref 0.50–1.10)
Calcium: 8.4 mg/dL (ref 8.4–10.5)
Chloride: 98 mEq/L (ref 96–112)
GFR calc Af Amer: 44 mL/min — ABNORMAL LOW (ref >90)
GFR, EST NON AFRICAN AMERICAN: 38 mL/min — AB (ref >90)
Glucose, Bld: 240 mg/dL — ABNORMAL HIGH (ref 70–99)
Potassium: 4.6 mEq/L (ref 3.7–5.3)
Sodium: 133 mEq/L — ABNORMAL LOW (ref 137–147)

## 2013-10-30 LAB — CBC WITH DIFFERENTIAL/PLATELET
Basophils Absolute: 0 10*3/uL (ref 0.0–0.1)
Basophils Relative: 0 % (ref 0–1)
EOS ABS: 0.3 10*3/uL (ref 0.0–0.7)
EOS PCT: 7 % — AB (ref 0–5)
HCT: 37.7 % (ref 36.0–46.0)
HEMOGLOBIN: 11.7 g/dL — AB (ref 12.0–15.0)
Lymphocytes Relative: 21 % (ref 12–46)
Lymphs Abs: 1.1 10*3/uL (ref 0.7–4.0)
MCH: 25.3 pg — AB (ref 26.0–34.0)
MCHC: 31 g/dL (ref 30.0–36.0)
MCV: 81.4 fL (ref 78.0–100.0)
Monocytes Absolute: 0.7 10*3/uL (ref 0.1–1.0)
Monocytes Relative: 13 % — ABNORMAL HIGH (ref 3–12)
Neutro Abs: 3.1 10*3/uL (ref 1.7–7.7)
Neutrophils Relative %: 59 % (ref 43–77)
PLATELETS: 188 10*3/uL (ref 150–400)
RBC: 4.63 MIL/uL (ref 3.87–5.11)
RDW: 15.3 % (ref 11.5–15.5)
WBC: 5.1 10*3/uL (ref 4.0–10.5)

## 2013-10-30 MED ORDER — SODIUM CHLORIDE 0.9 % IV BOLUS (SEPSIS)
500.0000 mL | Freq: Once | INTRAVENOUS | Status: AC
  Administered 2013-10-30: 500 mL via INTRAVENOUS

## 2013-10-30 MED ORDER — INSULIN GLARGINE 100 UNIT/ML ~~LOC~~ SOLN
40.0000 [IU] | Freq: Every day | SUBCUTANEOUS | Status: DC
  Administered 2013-10-30: 40 [IU] via SUBCUTANEOUS
  Filled 2013-10-30 (×2): qty 0.4

## 2013-10-30 MED ORDER — SODIUM CHLORIDE 0.9 % IV SOLN
INTRAVENOUS | Status: AC
  Administered 2013-10-30: 18:00:00 via INTRAVENOUS
  Administered 2013-10-30: 125 mL/h via INTRAVENOUS
  Administered 2013-10-31: 02:00:00 via INTRAVENOUS

## 2013-10-30 MED ORDER — SODIUM CHLORIDE 0.9 % IV SOLN: INTRAVENOUS | Status: DC

## 2013-10-30 MED ORDER — INSULIN ASPART 100 UNIT/ML ~~LOC~~ SOLN
5.0000 [IU] | Freq: Three times a day (TID) | SUBCUTANEOUS | Status: DC
  Administered 2013-10-30 – 2013-10-31 (×4): 5 [IU] via SUBCUTANEOUS

## 2013-10-30 MED ORDER — LEVOFLOXACIN 750 MG PO TABS
750.0000 mg | ORAL_TABLET | ORAL | Status: DC
  Administered 2013-10-31: 750 mg via ORAL
  Filled 2013-10-30: qty 1

## 2013-10-30 NOTE — Progress Notes (Signed)
Family Medicine Teaching Service Daily Progress Note Intern Pager: [phone]  Patient name: [name] Medical record number: [id number] Date of birth: Jul 17, 1955 Age: 58 y.o. Gender: female  Primary Care Provider: Garret Reddish, MD Consultants: none Code Status: full   Pt Overview and Major Events to Date:  5/7: admitted for lower extremity swelling  5/8 PM: transitioned to bactrim/levaquin from Vanc/zosyn  ABX/Culture Vanc 5/7>> 5/8 Zosyn 5/7>> 5/8 Bactrim 5/8 >> Levaquin 5/8>>  Blood culture 5/7  Assessment and Plan: IVAL BASQUEZ is a 58 y.o. female presenting with LLE erythema/edema c/w cellulitis. PMH is significant for poorly controlled diabetes, OSA/obesity, HTN, remote CAD, recurrent LE cellultitis and stage II chronic kidney disease.   # Recurrent Cellulitis, LE in diabetic pt - last cellulitis was on opposite leg, now on left leg. continues to improve today now on day 3 of antibiotics with transition to bactrim and levaquin yesterday evening (had started levaquin as QT no longer prolonged)   -  BCx NGTD, however, after ABx given  -  LE duplex on L: no DVT.   # AKI with known CKD Stage II- creatinine bumped today from 0.9-1.1 baseline to 1.5 -blood pressure this AM 99/60-plan to hold quinapril and amlodipine and continue metoprolol  -bolus 500 cc and then start IVF x 16 hours -change levaquin to every other day for now until GFR >49. Ordered Levaquin per pharmacy.  -may be due to vancomycin and iv contrast which have been stopped -am bmet (consider further workup if not improving)  # Dyspnea - Improved today and most likely 2/2 to her obesity hypoventilation and OSA.  - Negative LLE Duplex.  - BNP 537 - O2 PRN for sats > 92%   # Abdominal Pain: resolved yesterday. Lipase normal and lactic acid normal  - CT abdomen negative for acute process  - Continue to follow exams, benign on initial   # Hx of CAD - 30 % stenosis of LAD and 25% of RCA stenosis on cath from  02/15/2005. Heart Score 1, TIMI score 2.  - troponin (-) x 3 .  - Continue home Beta Blocker/ASA/Lipitor -hold ace-i for now due to [facility] but restart as able  #Prolonged Qtc: observed on initial EKG, resolved with am EKG on 5/8 - EKG: sinus rhythm with occasional PVC, QTc 451 on 5/8 -repeat EKG today  # HTN:  Stable,  - continue home medications (Norvasc/Accupril)   # Hyperlipidemia: stable  -Continue home Lipitor   # DM II, poorly controlled - Last A1C on 4/15 showing 12.3. Hold home Insulin-aspart, and glucophage  - insulin 35 units --> increase to 40 units today - Severe SSI and add 5 units with meals - CBG's  - will resume outpatient regimen on discharge and this will be titrated/changed after upcoming gastric bypass  #Diabetic Neuropathy - No evidence of ulcerations on her feet  -Continue home neurontin 900 mg TID   #OSA  - Continue home CPAP   #Hx of Depression  -Continue home Effexor   FEN/GI: NS @ 125 cc/hr x 20 hours, Carb modified diet.  Prophylaxis: SQ heparin   Disposition: pending improvement in creatinine, hopeful for d/c tomorrow if creatinine improves  Subjective: Patient feeling well today. She doesn't have any complaints. Is worried about the AKI but explained we would monitor  Objective: Temp:  [97.7 F (36.5 C)-98.3 F (36.8 C)] 98 F (36.7 C) (05/09 0440) Pulse Rate:  [60-65] 60 (05/09 0440) Resp:  [18] 18 (05/09 0440) BP: (113-137)/(66-72)  137/70 mmHg (05/09 0440) SpO2:  [91 %-94 %] 94 % (05/09 0440) Physical Exam: General: NAD, comfortable in bed, obese  Neck: No JVD, no LAD  Cardiovascular: RRR, no murmurs appreciated  Respiratory: CTAB, no wheezes  Abdomen: Soft/NT/ND, NABS, no HSM  Extremities: + 2 pulses B/L LE, trace pitting edema B/L  Skin: LLE erythema from foot to mid shin/posterior calf (continues to lighten and regress from marked lines), TTP LLE,  chronic venous changes on R side  Neuro: No focal Deficits   Laboratory:  Recent  Labs Lab 10/28/13 1305 10/29/13 0716 10/30/13 0700  WBC 13.2* 7.5 5.1  HGB 13.6 12.2 11.7*  HCT 41.0 38.5 37.7  PLT 206 199 188    Recent Labs Lab 10/28/13 1305 10/29/13 0716 10/30/13 0700  NA 137 137 133*  K 3.3* 3.4* 4.6  CL 93* 98 98  CO2 23 22 20   BUN 24* 20 30*  CREATININE 1.18* 1.16* 1.48*  CALCIUM 9.5 8.7 8.4  PROT 7.5  --   --   BILITOT 0.6  --   --   ALKPHOS 155*  --   --   ALT 12  --   --   AST 17  --   --   GLUCOSE 230* 185* 240*    Recent Labs Lab 10/29/13 0741 10/29/13 1150 10/29/13 1652 10/29/13 2100 10/30/13 0740  GLUCAP 172* 252* 279* 343* 239*    Recent Labs Lab 10/28/13 0015  PROBNP 537.1*     Recent Labs Lab 10/28/13 0015 10/28/13 1619 10/29/13 0716 10/29/13 1030  TROPONINI <0.30 <0.30 <0.30 <0.30   Imaging/Diagnostic Tests: DG Ankle - No acute changes or gas noted   CXR - No acute Cardiopulmonary disease   CT abdomen Pelvis  IMPRESSION:  No acute intra-abdominal or pelvic process.  Moderate fat containing periumbilical hernia.  Marin Olp, MD 10/30/2013, 9:53 AM PGY-3, [place] Intern pager: [phone], text pages welcome

## 2013-10-30 NOTE — Progress Notes (Signed)
RT spoke to patient about wearing CPAP and patient stated that she didn't want to wear tonight. Advised if she changed her mind to give RT a call. RT will continue to monitor.

## 2013-10-30 NOTE — Progress Notes (Signed)
FMTS ATTENDING  NOTE [name] Mandeville,MD I  have seen and examined this patient, reviewed their chart. I have discussed this patient with the resident. I agree with the resident's findings, assessment and care plan. 

## 2013-10-30 NOTE — Progress Notes (Signed)
MD contacted about BP 99/60 and HR 59 to clarify which scheduled 10am BP medications are okay to give. Orders received to hold amlodipine.

## 2013-10-31 LAB — CBC WITH DIFFERENTIAL/PLATELET
BASOS ABS: 0.1 10*3/uL (ref 0.0–0.1)
Basophils Relative: 1 % (ref 0–1)
EOS PCT: 7 % — AB (ref 0–5)
Eosinophils Absolute: 0.4 10*3/uL (ref 0.0–0.7)
HCT: 37.3 % (ref 36.0–46.0)
Hemoglobin: 11.5 g/dL — ABNORMAL LOW (ref 12.0–15.0)
Lymphocytes Relative: 26 % (ref 12–46)
Lymphs Abs: 1.5 10*3/uL (ref 0.7–4.0)
MCH: 25.4 pg — ABNORMAL LOW (ref 26.0–34.0)
MCHC: 30.8 g/dL (ref 30.0–36.0)
MCV: 82.3 fL (ref 78.0–100.0)
MONOS PCT: 12 % (ref 3–12)
Monocytes Absolute: 0.7 10*3/uL (ref 0.1–1.0)
NEUTROS PCT: 54 % (ref 43–77)
Neutro Abs: 3.1 10*3/uL (ref 1.7–7.7)
PLATELETS: 224 10*3/uL (ref 150–400)
RBC: 4.53 MIL/uL (ref 3.87–5.11)
RDW: 15.3 % (ref 11.5–15.5)
WBC: 5.8 10*3/uL (ref 4.0–10.5)

## 2013-10-31 LAB — BASIC METABOLIC PANEL
BUN: 22 mg/dL (ref 6–23)
CHLORIDE: 104 meq/L (ref 96–112)
CO2: 21 mEq/L (ref 19–32)
Calcium: 8.8 mg/dL (ref 8.4–10.5)
Creatinine, Ser: 1.18 mg/dL — ABNORMAL HIGH (ref 0.50–1.10)
GFR calc non Af Amer: 49 mL/min — ABNORMAL LOW (ref >90)
GFR, EST AFRICAN AMERICAN: 57 mL/min — AB (ref >90)
Glucose, Bld: 221 mg/dL — ABNORMAL HIGH (ref 70–99)
POTASSIUM: 5.2 meq/L (ref 3.7–5.3)
Sodium: 140 mEq/L (ref 137–147)

## 2013-10-31 LAB — GLUCOSE, CAPILLARY
GLUCOSE-CAPILLARY: 240 mg/dL — AB (ref 70–99)
GLUCOSE-CAPILLARY: 242 mg/dL — AB (ref 70–99)
Glucose-Capillary: 203 mg/dL — ABNORMAL HIGH (ref 70–99)

## 2013-10-31 MED ORDER — METOPROLOL SUCCINATE ER 25 MG PO TB24: 25.0000 mg | ORAL_TABLET | Freq: Every day | ORAL | Status: DC

## 2013-10-31 MED ORDER — AMLODIPINE BESYLATE 10 MG PO TABS
10.0000 mg | ORAL_TABLET | Freq: Every day | ORAL | Status: DC
  Administered 2013-10-31: 10 mg via ORAL
  Filled 2013-10-31: qty 1

## 2013-10-31 MED ORDER — LEVOFLOXACIN 750 MG PO TABS: 750.0000 mg | ORAL_TABLET | ORAL | Status: DC

## 2013-10-31 MED ORDER — METOPROLOL SUCCINATE ER 25 MG PO TB24
25.0000 mg | ORAL_TABLET | Freq: Every day | ORAL | Status: DC
  Filled 2013-10-31: qty 1

## 2013-10-31 MED ORDER — SULFAMETHOXAZOLE-TMP DS 800-160 MG PO TABS: 1.0000 | ORAL_TABLET | Freq: Two times a day (BID) | ORAL | Status: DC

## 2013-10-31 MED ORDER — LEVOFLOXACIN 750 MG PO TABS: 750.0000 mg | ORAL_TABLET | Freq: Every day | ORAL | Status: DC

## 2013-10-31 MED ORDER — INSULIN ASPART 100 UNIT/ML ~~LOC~~ SOLN
5.0000 [IU] | Freq: Three times a day (TID) | SUBCUTANEOUS | Status: DC
Start: 2013-10-31 — End: 2013-10-31

## 2013-10-31 NOTE — Progress Notes (Signed)
Family Medicine Teaching Service Daily Progress Note Intern Pager: [phone]  Patient name: [name] Medical record number: [id number] Date of birth: Jan 17, 1956 Age: 58 y.o. Gender: female  Primary Care Provider: Garret Reddish, MD Consultants: none Code Status: full   Pt Overview and Major Events to Date:  5/7: admitted for lower extremity swelling  5/8 PM: transitioned to bactrim/levaquin from Vanc/zosyn  ABX/Culture Vanc 5/7>> 5/8 Zosyn 5/7>> 5/8 Bactrim 5/8 >> Levaquin 5/8>>  Blood culture 5/7  Assessment and Plan: [name] is a 58 y.o. female presenting with LLE erythema/edema c/w cellulitis. PMH is significant for poorly controlled diabetes, OSA/obesity, HTN, remote CAD, recurrent LE cellultitis and stage II chronic kidney disease.   # Recurrent Cellulitis, LE in diabetic pt - last cellulitis was on opposite leg, now on left leg. continues to improve today now on day 3 of antibiotics with transition to bactrim and levaquin yesterday evening (had started levaquin as QT no longer prolonged)   -  BCx NGTD, however, after ABx given  -  LE duplex on L: no DVT.   # AKI with known CKD Stage II- creatinine decreased today from 0.9-1.48>1.18 -change levaquin to every other day for now until GFR >49.  - Ordered Levaquin per pharmacy.   # Dyspnea - Improved today and most likely 2/2 to her obesity hypoventilation and OSA.  - Negative LLE Duplex.  - BNP 537 - O2 PRN for sats > 92%   # Abdominal Pain: resolved yesterday. Lipase normal and lactic acid normal  - CT abdomen negative for acute process  - Continue to follow exams, benign on initial   # Hx of CAD - 30 % stenosis of LAD and 25% of RCA stenosis on cath from 02/15/2005. Heart Score 1, TIMI score 2.  - troponin (-) x 3 .  - changed toprol 50 mg to 25 mg  - restarted norvasc, will start acei upon discharge with f/u this week for BMP check   #Prolonged Qtc: observed on initial EKG, resolved with am EKG on 5/8 -  EKG: sinus rhythm with occasional PVC, QTc 451 on 5/8 -repeat EKG today  # HTN:  Stable,  - continue home medications (Norvasc/Accupril)   # Hyperlipidemia: stable  -Continue home Lipitor   # DM II, poorly controlled - Last A1C on 4/15 showing 12.3. Hold home Insulin-aspart, and glucophage  -  increase to 40 units - Severe SSI and add 5 units with meals - CBG's  - will resume outpatient regimen on discharge and this will be titrated/changed after upcoming gastric bypass  #Diabetic Neuropathy - No evidence of ulcerations on her feet  -Continue home neurontin 900 mg TID   #OSA  - Continue home CPAP   #Hx of Depression  -Continue home Effexor   FEN/GI: NS @ 125 cc/hr x 20 hours, Carb modified diet.  Prophylaxis: SQ heparin   Disposition: dc today with f/u in office to trend Cr.   Subjective: patient feeling well today with no complaints.   Objective: Temp:  [97.2 F (36.2 C)-98.3 F (36.8 C)] 97.2 F (36.2 C) (05/10 0522) Pulse Rate:  [49-59] 50 (05/10 0522) Resp:  [17-18] 17 (05/10 0522) BP: (95-156)/(60-80) 156/80 mmHg (05/10 0522) SpO2:  [95 %-98 %] 97 % (05/10 0522) Physical Exam: General: NAD, comfortable in bed, obese  Cardiovascular: RRR, no murmurs appreciated  Respiratory: CTAB, no wheezes  Abdomen: Soft/NT/ND, NABS, no HSM  Extremities: + 2 pulses B/L LE, trace pitting edema B/L  Skin: LLE  erythema from foot to mid shin/posterior calf (continues to lighten and regress from marked lines),  chronic venous changes on R side  Neuro: No focal Deficits   Laboratory:  Recent Labs Lab 10/29/13 0716 10/30/13 0700 10/31/13 0526  WBC 7.5 5.1 5.8  HGB 12.2 11.7* 11.5*  HCT 38.5 37.7 37.3  PLT 199 188 224    Recent Labs Lab 10/28/13 1305 10/29/13 0716 10/30/13 0700 10/31/13 0526  NA 137 137 133* 140  K 3.3* 3.4* 4.6 5.2  CL 93* 98 98 104  CO2 23 22 20 21   BUN 24* 20 30* 22  CREATININE 1.18* 1.16* 1.48* 1.18*  CALCIUM 9.5 8.7 8.4 8.8  PROT 7.5  --    --   --   BILITOT 0.6  --   --   --   ALKPHOS 155*  --   --   --   ALT 12  --   --   --   AST 17  --   --   --   GLUCOSE 230* 185* 240* 221*    Recent Labs Lab 10/30/13 0740 10/30/13 1207 10/30/13 1651 10/30/13 2206 10/31/13 0751  GLUCAP 239* 266* 282* 261* 203*    Recent Labs Lab 10/28/13 0015  PROBNP 537.1*     Recent Labs Lab 10/28/13 0015 10/28/13 1619 10/29/13 0716 10/29/13 1030  TROPONINI <0.30 <0.30 <0.30 <0.30   Imaging/Diagnostic Tests: DG Ankle - No acute changes or gas noted   CXR - No acute Cardiopulmonary disease   CT abdomen Pelvis  IMPRESSION:  No acute intra-abdominal or pelvic process.  Moderate fat containing periumbilical hernia.  Rosemarie Ax, MD 10/31/2013, 8:33 AM PGY-3, [place] Intern pager: [phone], text pages welcome

## 2013-10-31 NOTE — Progress Notes (Signed)
NURSING PROGRESS NOTE  CARRI SPILLERS [id number] Discharge Data: 10/31/2013 6:20 PM Attending Provider: No att. providers found WUJ:[id number], Annie Main, MD   Nena Jordan Common to be D/C'd Home per MD order.    All IV's will be discontinued and monitored for bleeding.  All belongings will be returned to patient for patient to take home.  Last Documented Vital Signs:  Blood pressure 164/80, pulse 55, temperature 97.2 F (36.2 C), temperature source Oral, resp. rate 17, height 5' 8.11" (1.73 m), weight 139 kg (306 lb 7 oz), SpO2 97.00%.  Joslyn Hy, MSN, RN, CMSRN    \

## 2013-10-31 NOTE — Discharge Instructions (Signed)
Ms. Doke, you were admitted for cellulitis. We will need you to take the rest of the antibiotics as prescribed.  One thing to watch for is if your feel fatigued, check your heart rate, if it is below 55 beats per minutes then call the family medicine clinic so they may advise you about your medications.   I also want you to hold off taking your metformin and quinapril for one more day and drink plenty of water. This is to make sure that your kidneys are doing ok.   Please follow up as scheduled.   Cellulitis Cellulitis is an infection of the skin and the tissue beneath it. The infected area is usually red and tender. Cellulitis occurs most often in the arms and lower legs.  CAUSES  Cellulitis is caused by bacteria that enter the skin through cracks or cuts in the skin. The most common types of bacteria that cause cellulitis are Staphylococcus and Streptococcus. SYMPTOMS   Redness and warmth.  Swelling.  Tenderness or pain.  Fever. DIAGNOSIS  Your caregiver can usually determine what is wrong based on a physical exam. Blood tests may also be done. TREATMENT  Treatment usually involves taking an antibiotic medicine. HOME CARE INSTRUCTIONS   Take your antibiotics as directed. Finish them even if you start to feel better.  Keep the infected arm or leg elevated to reduce swelling.  Apply a warm cloth to the affected area up to 4 times per day to relieve pain.  Only take over-the-counter or prescription medicines for pain, discomfort, or fever as directed by your caregiver.  Keep all follow-up appointments as directed by your caregiver. SEEK MEDICAL CARE IF:   You notice red streaks coming from the infected area.  Your red area gets larger or turns dark in color.  Your bone or joint underneath the infected area becomes painful after the skin has healed.  Your infection returns in the same area or another area.  You notice a swollen bump in the infected area.  You develop new  symptoms. SEEK IMMEDIATE MEDICAL CARE IF:   You have a fever.  You feel very sleepy.  You develop vomiting or diarrhea.  You have a general ill feeling (malaise) with muscle aches and pains. MAKE SURE YOU:   Understand these instructions.  Will watch your condition.  Will get help right away if you are not doing well or get worse. Document Released: 03/20/2005 Document Revised: 12/10/2011 Document Reviewed: 08/26/2011 [facility] Patient Information 2014 [place].

## 2013-10-31 NOTE — Discharge Summary (Signed)
[facility] Discharge Summary  Patient name: [name] Medical record number: [id number] Date of birth: 07/05/1955 Age: 58 y.o. Gender: female Date of Admission: 10/28/2013  Date of Discharge: 10/31/13 Admitting Physician: Willeen Niece, MD  Primary Care Provider: Garret Reddish, MD Consultants: none  Indication for Hospitalization: left leg swelling  Discharge Diagnoses/Problem List:  Patient Active Problem List   Diagnosis Date Noted  . Cellulitis of left lower leg 10/28/2013  . Cellulitis 10/28/2013  . Hyperlipidemia 08/26/2013  . CAD (coronary artery disease), native coronary artery 08/26/2013  . Morbid obesity 08/26/2013  . OSA on CPAP 08/26/2013  . Anemia 06/19/2012  . CKD (chronic kidney disease), stage II 06/04/2012  . Low back pain 03/05/2012  . At high risk for falls 06/12/2011  . Osteoarthritis of knee 04/23/2011  . UNSPECIFIED VENOUS INSUFFICIENCY 05/28/2010  . PSORIASIS 05/28/2010  . HYPERCHOLESTEROLEMIA 03/20/2010  . HYPERTENSION 03/20/2010  . DERMATITIS, HANDS 05/02/2009  . PERIPHERAL NEUROPATHY 01/12/2009  . DM (diabetes mellitus), type 2, uncontrolled 02/18/2008  . DEPRESSION, CHRONIC 10/06/2007  . OBSTRUCTIVE SLEEP APNEA 06/12/2007  . GOITER, MULTINODULAR 05/13/2007     Disposition: home   Discharge Condition: improved   Discharge Exam:  General: NAD, comfortable in bed, obese  Cardiovascular: RRR, no murmurs appreciated  Respiratory: CTAB, no wheezes  Abdomen: Soft/NT/ND, NABS, no HSM  Extremities: + 2 pulses B/L LE, trace pitting edema B/L  Skin: LLE erythema from foot to mid shin/posterior calf (continues to lighten and regress from marked lines), chronic venous changes on R side  Neuro: No focal Deficits    Brief Hospital Course:  [name] is a 58 y.o. female presenting with LLE erythema/edema c/w cellulitis. PMH is significant for poorly controlled diabetes, OSA/obesity, HTN, remote CAD, recurrent LE  cellultitis and stage II chronic kidney disease.   # Recurrent Cellulitis, LE in diabetic pt - Patient with history of cellulitis but in right leg during last admission in November 2014. Presented with Left LE cellulitis and swelling. She was started on Vanc/zosyn. Blood culture has been no growth to date. Lower left venous duplex was negative for DVT.  EKG was repeated and her QTc was normalized to 451. She was transitioned to bactrim and levaquin and did well. Her erythema was regressing inside the lines demarcated on presentation. She was discharged to complete a 10 day course of antibiotics.   # AKI with known CKD Stage II-  Patient with a baseline of cr 0.9-1.1. Noticed to have an elevation in her Cr to 1.48. She was given fluids with quinapril and amlodipine being held. Levaquin was changed to every other day dosing due to her GFR. The elevation in Cr could have been related to vanc or iv contrast. Cr. Improved prior to discharge and levaquin was changed to daily dosing. Her metformin and quinapril were held for another day after discharge and will need f/u bmp.     # Dyspnea - Complained about upon admission. CXR was unrevealing. Her dyspnea is most likely 2/2 to obesity hypoventilation and OSA. Pro-BNp was 537 and troponin's were negative. Resolved prior to discharge.   # Abdominal Pain: CT abdomen was negative for acute process.  Lipase normal and lactic acid normal. Resolved during admission.   # Hx of CAD - 30 % stenosis of LAD and 25% of RCA stenosis on cath from 02/15/2005. Heart Score 1, TIMI score 2.  Patient with continued bradycardia (55-60) during admission and was transitioned to metoprolol 25 mg  from 50 mg. She was continued on home norvasc and quinapril was held an extra day after discharge due to AKI as described above.   #Prolonged Qtc: observed on initial EKG, resolved with am EKG on 5/8.   # HTN: Slightly elevated on admission but not all home medications were continued. Follow  up as necessary as outpatient.   # Hyperlipidemia: Continued home Lipitor   # DM II, poorly controlled - Last A1C on 4/15 showing 12.3. Hold home Insulin-aspart, and glucophage. Metformin as held another day after discharge due to AKI as described above. She will resume outpatient regimen one day after discharge and this will be titrated/changed after upcoming gastric bypass   #Diabetic Neuropathy - No evidence of ulcerations on her feet. Continue home neurontin 900 mg TID   #OSA: On home CPAP   #Hx of Depression: Continue home Effexor    Issues for Follow Up:  1. Increase in Cr. Will need f/u BMP to check cr. Metformin and ACEi were held an extra day after discharge.  2. Bradycardia: metoprolol was decreased from 50 mg to 25 mg prior to discharge.  3. Cellulitis is resolved with ABX.   Significant Procedures: none  Significant Labs and Imaging:   Recent Labs Lab 10/29/13 0716 10/30/13 0700 10/31/13 0526  WBC 7.5 5.1 5.8  HGB 12.2 11.7* 11.5*  HCT 38.5 37.7 37.3  PLT 199 188 224    Recent Labs Lab 10/28/13 0015  10/28/13 1305 10/29/13 0716 10/30/13 0700 10/31/13 0526  NA  --   --  137 137 133* 140  K  --   < > 3.3* 3.4* 4.6 5.2  CL  --   --  93* 98 98 104  CO2  --   --  23 22 20 21   GLUCOSE  --   --  230* 185* 240* 221*  BUN  --   --  24* 20 30* 22  CREATININE 1.10  --  1.18* 1.16* 1.48* 1.18*  CALCIUM  --   --  9.5 8.7 8.4 8.8  ALKPHOS  --   --  155*  --   --   --   AST  --   --  17  --   --   --   ALT  --   --  12  --   --   --   ALBUMIN  --   --  3.2*  --   --   --   < > = values in this interval not displayed.     Recent Labs Lab 10/28/13 0015 10/28/13 1619 10/29/13 0716 10/29/13 1030  TROPONINI <0.30 <0.30 <0.30 <0.30    Recent Labs Lab 10/30/13 1651 10/30/13 2206 10/31/13 0751 10/31/13 0929 10/31/13 1155  GLUCAP 282* 261* 203* 240* 242*    Recent Labs Lab 10/28/13 0015  PROBNP 537.1*   Urinalysis    Component Value Date/Time    COLORURINE YELLOW 10/28/2013 1620   APPEARANCEUR CLOUDY* 10/28/2013 1620   LABSPEC 1.031* 10/28/2013 1620   PHURINE 5.5 10/28/2013 1620   GLUCOSEU 250* 10/28/2013 1620   GLUCOSEU 100 (?) 03/28/2010 1026   HGBUR NEGATIVE 10/28/2013 1620   BILIRUBINUR SMALL* 10/28/2013 1620   KETONESUR 15* 10/28/2013 1620   PROTEINUR 100* 10/28/2013 1620   UROBILINOGEN 0.2 10/28/2013 1620   NITRITE NEGATIVE 10/28/2013 1620   LEUKOCYTESUR NEGATIVE 10/28/2013 1620   DG Ankle - No acute changes or gas noted   CXR - No acute Cardiopulmonary disease   CT  abdomen Pelvis  IMPRESSION:  No acute intra-abdominal or pelvic process.  Moderate fat containing periumbilical hernia.  Results/Tests Pending at Time of Discharge: blood culture (5/8)   Discharge Medications:    Medication List         acetaminophen 325 MG tablet  Commonly known as:  TYLENOL  Take 650 mg by mouth every 6 (six) hours as needed for headache.     amLODipine 10 MG tablet  Commonly known as:  NORVASC  Take 10 mg by mouth daily.     aspirin EC 81 MG tablet  Take 1 tablet (81 mg total) by mouth daily.     atorvastatin 40 MG tablet  Commonly known as:  LIPITOR  Take 1 tablet (40 mg total) by mouth daily.     gabapentin 300 MG capsule  Commonly known as:  NEURONTIN  Take 900 mg by mouth 3 (three) times daily.     insulin aspart 100 UNIT/ML injection  Commonly known as:  novoLOG  Inject 5 Units into the skin 3 (three) times daily with meals.     insulin aspart protamine- aspart (70-30) 100 UNIT/ML injection  Commonly known as:  NOVOLOG MIX 70/30  Take 80 units before lunch and 100 units before dinner. Call [phone] with any blood sugar less than 90.     levofloxacin 750 MG tablet  Commonly known as:  LEVAQUIN  Take 1 tablet (750 mg total) by mouth every other day.     metFORMIN 1000 MG tablet  Commonly known as:  GLUCOPHAGE  Take 1 tablet (1,000 mg total) by mouth 2 (two) times daily with a meal.     metoprolol succinate 25 MG 24 hr tablet   Commonly known as:  TOPROL-XL  Take 1 tablet (25 mg total) by mouth daily.     quinapril 40 MG tablet  Commonly known as:  ACCUPRIL  Take 1 tablet (40 mg total) by mouth daily.     sulfamethoxazole-trimethoprim 800-160 MG per tablet  Commonly known as:  BACTRIM DS  Take 1 tablet by mouth every 12 (twelve) hours.     venlafaxine XR 75 MG 24 hr capsule  Commonly known as:  EFFEXOR-XR  Take 3 capsules (225 mg total) by mouth daily.        Discharge Instructions: Please refer to Patient Instructions section of EMR for full details.  Patient was counseled important signs and symptoms that should prompt return to medical care, changes in medications, dietary instructions, activity restrictions, and follow up appointments.   Follow-Up Appointments: Follow-up Information   Follow up with Melrose Nakayama, MD On 11/05/2013. (2:00)    Specialty:  Family Medicine   Contact information:   [place] [address] [phone]       Rosemarie Ax, MD 10/31/2013, 1:14 PM PGY-1, [place]

## 2013-10-31 NOTE — Progress Notes (Signed)
FMTS ATTENDING  NOTE [name] Marmo,MD I  have seen and examined this patient, reviewed their chart. I have discussed this patient with the resident. I agree with the resident's findings, assessment and care plan. 

## 2013-11-01 NOTE — Care Management Note (Signed)
    Page 1 of 1   11/01/2013     5:55:06 PM CARE MANAGEMENT NOTE 11/01/2013  Patient:  [name], [name]   Account Number:  [id number]  Date Initiated:  11/01/2013  Documentation initiated by:  Tomi Bamberger  Subjective/Objective Assessment:   dx high blood sugar  admit- lives with spouse.     Action/Plan:   Anticipated DC Date:  10/31/2013   Anticipated DC Plan:  [place]  CM consult      Choice offered to / List presented to:             Status of service:  Completed, signed off Medicare Important Message given?  YES (If response is "NO", the following Medicare IM given date fields will be blank) Date Medicare IM given:  10/28/2013 Date Additional Medicare IM given:    Discharge Disposition:  HOME/SELF CARE  Per UR Regulation:  Reviewed for med. necessity/level of care/duration of stay  If discussed at [place] of Stay Meetings, dates discussed:    Comments:

## 2013-11-02 NOTE — Discharge Summary (Signed)
FMTS ATTENDING  NOTE [name] Whitfill,MD I  have seen and examined this patient, reviewed their chart. I have discussed this patient with the resident. I agree with the resident's findings, assessment and care plan. 

## 2013-11-03 ENCOUNTER — Emergency Department: Payer: Self-pay | Admitting: Emergency Medicine

## 2013-11-03 LAB — URINALYSIS, COMPLETE
BLOOD: NEGATIVE
Bacteria: NONE SEEN
Bilirubin,UR: NEGATIVE
Glucose,UR: >=500 mg/dL (ref 0–75)
KETONE: NEGATIVE
Nitrite: NEGATIVE
Ph: 6 (ref 4.5–8.0)
Protein: NEGATIVE
RBC,UR: <1 /HPF (ref 0–5)
Specific Gravity: 1.013 (ref 1.003–1.030)

## 2013-11-03 LAB — CBC
HCT: 37.7 % (ref 35.0–47.0)
HGB: 12.2 g/dL (ref 12.0–16.0)
MCH: 25.7 pg — AB (ref 26.0–34.0)
MCHC: 32.4 g/dL (ref 32.0–36.0)
MCV: 79 fL — ABNORMAL LOW (ref 80–100)
Platelet: 245 10*3/uL (ref 150–440)
RBC: 4.76 10*6/uL (ref 3.80–5.20)
RDW: 16.1 % — AB (ref 11.5–14.5)
WBC: 6.5 10*3/uL (ref 3.6–11.0)

## 2013-11-03 LAB — COMPREHENSIVE METABOLIC PANEL
ALBUMIN: 3.2 g/dL — AB (ref 3.4–5.0)
ALK PHOS: 199 U/L — AB
ALT: 24 U/L (ref 12–78)
Anion Gap: 7 (ref 7–16)
BUN: 11 mg/dL (ref 7–18)
Bilirubin,Total: 0.4 mg/dL (ref 0.2–1.0)
CALCIUM: 9.6 mg/dL (ref 8.5–10.1)
CO2: 28 mmol/L (ref 21–32)
Chloride: 102 mmol/L (ref 98–107)
Creatinine: 1.22 mg/dL (ref 0.60–1.30)
EGFR (African American): 56 — ABNORMAL LOW
EGFR (Non-African Amer.): 48 — ABNORMAL LOW
Glucose: 329 mg/dL — ABNORMAL HIGH (ref 65–99)
OSMOLALITY: 286 (ref 275–301)
Potassium: 3.9 mmol/L (ref 3.5–5.1)
SGOT(AST): 22 U/L (ref 15–37)
Sodium: 137 mmol/L (ref 136–145)
TOTAL PROTEIN: 7.6 g/dL (ref 6.4–8.2)

## 2013-11-04 LAB — CULTURE, BLOOD (ROUTINE X 2)
CULTURE: NO GROWTH
Culture: NO GROWTH

## 2013-11-05 ENCOUNTER — Inpatient Hospital Stay: Payer: Medicare HMO | Admitting: Family Medicine

## 2013-11-08 ENCOUNTER — Ambulatory Visit: Payer: Self-pay | Admitting: Bariatrics

## 2013-11-08 LAB — COMPREHENSIVE METABOLIC PANEL
ALK PHOS: 157 U/L — AB
Albumin: 3.5 g/dL (ref 3.4–5.0)
Anion Gap: 5 — ABNORMAL LOW (ref 7–16)
BILIRUBIN TOTAL: 0.4 mg/dL (ref 0.2–1.0)
BUN: 22 mg/dL — AB (ref 7–18)
CHLORIDE: 99 mmol/L (ref 98–107)
Calcium, Total: 9.8 mg/dL (ref 8.5–10.1)
Co2: 32 mmol/L (ref 21–32)
Creatinine: 1.14 mg/dL (ref 0.60–1.30)
EGFR (Non-African Amer.): 53 — ABNORMAL LOW
GLUCOSE: 365 mg/dL — AB (ref 65–99)
OSMOLALITY: 290 (ref 275–301)
POTASSIUM: 4 mmol/L (ref 3.5–5.1)
SGOT(AST): 15 U/L (ref 15–37)
SGPT (ALT): 19 U/L (ref 12–78)
SODIUM: 136 mmol/L (ref 136–145)
TOTAL PROTEIN: 7.5 g/dL (ref 6.4–8.2)

## 2013-11-08 LAB — CBC WITH DIFFERENTIAL/PLATELET
Basophil #: 0.1 10*3/uL (ref 0.0–0.1)
Basophil %: 0.7 %
EOS PCT: 2.8 %
Eosinophil #: 0.2 10*3/uL (ref 0.0–0.7)
HCT: 39.4 % (ref 35.0–47.0)
HGB: 12.6 g/dL (ref 12.0–16.0)
LYMPHS PCT: 17.4 %
Lymphocyte #: 1.5 10*3/uL (ref 1.0–3.6)
MCH: 25.2 pg — ABNORMAL LOW (ref 26.0–34.0)
MCHC: 31.9 g/dL — ABNORMAL LOW (ref 32.0–36.0)
MCV: 79 fL — ABNORMAL LOW (ref 80–100)
Monocyte #: 0.5 x10 3/mm (ref 0.2–0.9)
Monocyte %: 5.5 %
NEUTROS PCT: 73.6 %
Neutrophil #: 6.2 10*3/uL (ref 1.4–6.5)
Platelet: 283 10*3/uL (ref 150–440)
RBC: 4.98 10*6/uL (ref 3.80–5.20)
RDW: 15.9 % — ABNORMAL HIGH (ref 11.5–14.5)
WBC: 8.4 10*3/uL (ref 3.6–11.0)

## 2013-11-09 ENCOUNTER — Inpatient Hospital Stay: Payer: Self-pay | Admitting: Bariatrics

## 2013-11-09 HISTORY — PX: GASTRIC BYPASS: SHX52

## 2013-11-10 LAB — BASIC METABOLIC PANEL
Anion Gap: 4 — ABNORMAL LOW (ref 7–16)
BUN: 14 mg/dL (ref 7–18)
CREATININE: 1.28 mg/dL (ref 0.60–1.30)
Calcium, Total: 8.8 mg/dL (ref 8.5–10.1)
Chloride: 106 mmol/L (ref 98–107)
Co2: 28 mmol/L (ref 21–32)
EGFR (African American): 53 — ABNORMAL LOW
EGFR (Non-African Amer.): 46 — ABNORMAL LOW
GLUCOSE: 152 mg/dL — AB (ref 65–99)
Osmolality: 279 (ref 275–301)
Potassium: 4 mmol/L (ref 3.5–5.1)
Sodium: 138 mmol/L (ref 136–145)

## 2013-11-10 LAB — CBC WITH DIFFERENTIAL/PLATELET
Basophil #: 0.1 10*3/uL (ref 0.0–0.1)
Basophil %: 0.5 %
EOS ABS: 0.1 10*3/uL (ref 0.0–0.7)
Eosinophil %: 0.5 %
HCT: 40.3 % (ref 35.0–47.0)
HGB: 12.8 g/dL (ref 12.0–16.0)
Lymphocyte #: 1.4 10*3/uL (ref 1.0–3.6)
Lymphocyte %: 11.2 %
MCH: 25.3 pg — ABNORMAL LOW (ref 26.0–34.0)
MCHC: 31.7 g/dL — ABNORMAL LOW (ref 32.0–36.0)
MCV: 80 fL (ref 80–100)
MONOS PCT: 4.6 %
Monocyte #: 0.6 x10 3/mm (ref 0.2–0.9)
Neutrophil #: 10.5 10*3/uL — ABNORMAL HIGH (ref 1.4–6.5)
Neutrophil %: 83.2 %
Platelet: 293 10*3/uL (ref 150–440)
RBC: 5.06 10*6/uL (ref 3.80–5.20)
RDW: 16.2 % — AB (ref 11.5–14.5)
WBC: 12.6 10*3/uL — ABNORMAL HIGH (ref 3.6–11.0)

## 2013-11-10 LAB — PATHOLOGY REPORT

## 2013-11-10 LAB — MAGNESIUM: Magnesium: 1.6 mg/dL — ABNORMAL LOW

## 2013-12-01 ENCOUNTER — Ambulatory Visit: Payer: Self-pay | Admitting: Bariatrics

## 2013-12-02 ENCOUNTER — Encounter: Payer: Self-pay | Admitting: Family Medicine

## 2013-12-02 ENCOUNTER — Ambulatory Visit (INDEPENDENT_AMBULATORY_CARE_PROVIDER_SITE_OTHER): Payer: Medicare HMO | Admitting: Family Medicine

## 2013-12-02 VITALS — BP 152/72 | HR 75 | Temp 98.4°F | Wt 276.0 lb

## 2013-12-02 DIAGNOSIS — Z9884 Bariatric surgery status: Secondary | ICD-10-CM

## 2013-12-02 DIAGNOSIS — IMO0002 Reserved for concepts with insufficient information to code with codable children: Secondary | ICD-10-CM

## 2013-12-02 DIAGNOSIS — I2510 Atherosclerotic heart disease of native coronary artery without angina pectoris: Secondary | ICD-10-CM

## 2013-12-02 DIAGNOSIS — N182 Chronic kidney disease, stage 2 (mild): Secondary | ICD-10-CM

## 2013-12-02 DIAGNOSIS — I10 Essential (primary) hypertension: Secondary | ICD-10-CM

## 2013-12-02 DIAGNOSIS — IMO0001 Reserved for inherently not codable concepts without codable children: Secondary | ICD-10-CM

## 2013-12-02 DIAGNOSIS — E1165 Type 2 diabetes mellitus with hyperglycemia: Secondary | ICD-10-CM

## 2013-12-02 NOTE — Patient Instructions (Signed)
I have altered your medicines, please see list. I want you to take all the medications listed.  I would like for you to call your bariatric surgeon. You have a history of nonobstructive coronary artery disease and if you can take aspirin, I would like for you to. Please let me know if you cannot due to the surgery but start taking it if he allows you to (the risk here is ulcers in your stomach). I am ok if you cannot as long as you are taking your cholesterol medicine.  For your diabetes, only take metformin for now. If your blood sugars are over 250, please give me a call. Log your sugars and follow up with [place] in a month to meet your new doctor.   Thanks and it has been a pleasure being your physician,  Dr. Yong Channel

## 2013-12-04 ENCOUNTER — Encounter: Payer: Self-pay | Admitting: Family Medicine

## 2013-12-04 DIAGNOSIS — Z9884 Bariatric surgery status: Secondary | ICD-10-CM | POA: Insufficient documentation

## 2013-12-04 HISTORY — DX: Bariatric surgery status: Z98.84

## 2013-12-04 NOTE — Assessment & Plan Note (Signed)
Now off large dose insulin with bariatric surgery. Majority of CBGs <200 with only prn insulin (3x >200 in last 2 weeks). GIven 40 lbs weight loss from bariatric surgery, have asked patient to stop insulin and start metformin back. I asked her to call me if she has readings over 200 over the next few weeks and she will see me at the end of the month. WIll wait on a1c for another 2 months but suspect drastic improvement will be evident.

## 2013-12-04 NOTE — Progress Notes (Signed)
Garret Reddish, MD Phone: [phone]  Subjective:   [name] is a 58 y.o. year old very pleasant female patient who presents with the following:  Patient recently had a Duodenal switch with sleeve gastrectomy. She was taken off of metformin, quinapril, metoprolol, atorvastatin, and aspirin. SHe remains on prn insulin, amlodipine, venlafaxine, and gabapentin. She presents today for medication management.   DIABETES Type II Medications taking and tolerating- was taken off of all medications after recent bariatric surgery except for prn insulin Blood Sugars per patient-fasting- typically 90-130. She has had some that were 200-250 (3x in last 2 weeks) for which she gives herself 40 units of insulin without hypoglycemia after Diet-small meals due to bariatric surgery Regular Exercise-yes going to gym several days a week including doing some swimming  On Aspirin-no, taken off after surgery On statin-no, taken off after surgery Daily foot monitoring-yes  ROS- Denies Polyuria,Polydipsia, nocturia, Vision changes, feet or hand numbness/pain/tingling. Denies Hypoglycemia symptoms other than in first week after surgery (sweaty, hungry, weak anxious, tremor, palpitations, confusion, behavior change).   Hemoglobin a1c:  Lab Results  Component Value Date   HGBA1C 12.3 10/06/2013   HGBA1C 11.3* 04/30/2013   HGBA1C 10.6 02/04/2013    Hypertension Nonobstructive CAD CKD Stage II BP Readings from Last 3 Encounters:  12/02/13 152/72  10/31/13 164/80  10/06/13 153/79   Home BP monitoring-no Compliant with medications-amlodipine only as was taken off of quinapril ROS-Denies any CP, HA, SOB, blurry vision, LE edema, transient weakness, orthopnea, PND.   Past Medical History- Patient Active Problem List   Diagnosis Date Noted  . S/P bariatric surgery-duodenal switch with sleeve gastrectomy 12/04/2013    Priority: High  . Morbid obesity 08/26/2013    Priority: High  . DM (diabetes  mellitus), type 2, uncontrolled 02/18/2008    Priority: High  . Nonobstructive CAD  08/26/2013    Priority: Medium  . At high risk for falls 06/12/2011    Priority: Medium  . HYPERTENSION 03/20/2010    Priority: Medium  . PERIPHERAL NEUROPATHY 01/12/2009    Priority: Medium  . DEPRESSION, CHRONIC 10/06/2007    Priority: Medium  . OBSTRUCTIVE SLEEP APNEA 06/12/2007    Priority: Medium  . GOITER, MULTINODULAR 05/13/2007    Priority: Medium  . Anemia 06/19/2012    Priority: Low  . CKD (chronic kidney disease), stage II 06/04/2012    Priority: Low  . Low back pain 03/05/2012    Priority: Low  . Osteoarthritis of knee 04/23/2011    Priority: Low  . UNSPECIFIED VENOUS INSUFFICIENCY 05/28/2010    Priority: Low  . PSORIASIS 05/28/2010    Priority: Low  . HYPERCHOLESTEROLEMIA 03/20/2010    Priority: Low  . DERMATITIS, HANDS 05/02/2009    Priority: Low   Medications- reviewed and updated Current Outpatient Prescriptions  Medication Sig Dispense Refill  . amLODipine (NORVASC) 10 MG tablet Take 10 mg by mouth daily.      Marland Kitchen atorvastatin (LIPITOR) 40 MG tablet Take 1 tablet (40 mg total) by mouth daily.  90 tablet  3  . gabapentin (NEURONTIN) 300 MG capsule Take 900 mg by mouth 3 (three) times daily.       . metFORMIN (GLUCOPHAGE) 1000 MG tablet Take 1 tablet (1,000 mg total) by mouth 2 (two) times daily with a meal.  180 tablet  3  . quinapril (ACCUPRIL) 40 MG tablet Take 1 tablet (40 mg total) by mouth daily.  90 tablet  3  . venlafaxine XR (EFFEXOR-XR) 75 MG 24  hr capsule Take 3 capsules (225 mg total) by mouth daily.  270 capsule  3  . [DISCONTINUED] Alum & Mag Hydroxide-Simeth (MAGIC MOUTHWASH W/LIDOCAINE) SOLN Take 5 mLs by mouth 3 (three) times daily as needed.  100 mL  0   No current facility-administered medications for this visit.    Objective: BP 152/72  Pulse 75  Temp(Src) 98.4 F (36.9 C) (Oral)  Wt 276 lb (125.193 kg) Gen: NAD, resting comfortably CV: RRR no  murmurs rubs or gallops Lungs: CTAB no crackles, wheeze, rhonchi Abdomen: well healing scars on abdomen. soft/nontender/nondistended/normal bowel sounds. No rebound or guarding.  Ext: 1+ edema Skin: warm, dry, no rash   Assessment/Plan:  DM (diabetes mellitus), type 2, uncontrolled Now off large dose insulin with bariatric surgery. Majority of CBGs <200 with only prn insulin (3x >200 in last 2 weeks). GIven 40 lbs weight loss from bariatric surgery, have asked patient to stop insulin and start metformin back. I asked her to call me if she has readings over 200 over the next few weeks and she will see me at the end of the month. WIll wait on a1c for another 2 months but suspect drastic improvement will be evident.   S/P bariatric surgery-duodenal switch with sleeve gastrectomy Patient has lost 40 lbs. Was taken off multiple medications some of which I have restarted.   HYPERTENSION SBP poorly controlled. Will restart quinapril. Hold off on low dose metoprolol for now.   Nonobstructive CAD  I think risks of aspirin with only nonobstructive CAD likely outweigh benefits of it but will ask patient to seek bariatric surgeons input on this topic.    CKD (chronic kidney disease), stage II Restart quinapril. Recent readings of creatinine with GFR in stage III range but will monitor for now, these seem to have been around time of surgery.

## 2013-12-04 NOTE — Assessment & Plan Note (Addendum)
Patient has lost 40 lbs. Was taken off multiple medications some of which I have restarted.

## 2013-12-04 NOTE — Assessment & Plan Note (Signed)
I think risks of aspirin with only nonobstructive CAD likely outweigh benefits of it but will ask patient to seek bariatric surgeons input on this topic.

## 2013-12-04 NOTE — Assessment & Plan Note (Signed)
Restart quinapril. Recent readings of creatinine with GFR in stage III range but will monitor for now, these seem to have been around time of surgery.

## 2013-12-04 NOTE — Assessment & Plan Note (Signed)
SBP poorly controlled. Will restart quinapril. Hold off on low dose metoprolol for now.

## 2013-12-13 ENCOUNTER — Telehealth: Payer: Self-pay | Admitting: Family Medicine

## 2013-12-13 DIAGNOSIS — E1149 Type 2 diabetes mellitus with other diabetic neurological complication: Secondary | ICD-10-CM

## 2013-12-13 MED ORDER — GLUCOSE BLOOD VI STRP: ORAL_STRIP | Status: DC

## 2013-12-13 MED ORDER — ACCU-CHEK AVIVA PLUS W/DEVICE KIT: PACK | Status: DC

## 2013-12-13 NOTE — Telephone Encounter (Signed)
Changed to aviva plus as [facility] no longer carries prior strips/meter

## 2013-12-13 NOTE — Addendum Note (Signed)
Addended by: Marin Olp on: 12/13/2013 01:50 PM   Modules accepted: Orders

## 2013-12-13 NOTE — Telephone Encounter (Signed)
Pt called and needs a refill on her test strips. She needs this called into the [facility] at [address]. She is completley out. jw

## 2013-12-13 NOTE — Telephone Encounter (Signed)
Sent in Rx.

## 2013-12-13 NOTE — Addendum Note (Signed)
Addended by: Marin Olp on: 12/13/2013 03:13 PM   Modules accepted: Orders

## 2013-12-15 ENCOUNTER — Other Ambulatory Visit: Payer: Self-pay | Admitting: Family Medicine

## 2013-12-15 NOTE — Telephone Encounter (Signed)
Patient needs test strip ASAP. Completely out.

## 2013-12-15 NOTE — Telephone Encounter (Signed)
Blue team-can you check with pharmacy to make sure this is filled as this is the 2nd time I have sent this during this week. Ask them to call patient.I have sent in 2 different types as well per requests received.

## 2013-12-16 NOTE — Telephone Encounter (Signed)
[facility] (spoke with Lilia Pro) they have the Rx and it is being processed.  She states that they will stop the additional refill request from coming, and that the pt should have them in the next few days.  Pt informed. [place], [name]

## 2013-12-20 ENCOUNTER — Ambulatory Visit (INDEPENDENT_AMBULATORY_CARE_PROVIDER_SITE_OTHER): Payer: Medicare HMO | Admitting: Family Medicine

## 2013-12-20 ENCOUNTER — Encounter: Payer: Self-pay | Admitting: Family Medicine

## 2013-12-20 VITALS — BP 169/75 | HR 56 | Temp 98.3°F | Wt 263.0 lb

## 2013-12-20 DIAGNOSIS — I10 Essential (primary) hypertension: Secondary | ICD-10-CM

## 2013-12-20 DIAGNOSIS — H9190 Unspecified hearing loss, unspecified ear: Secondary | ICD-10-CM

## 2013-12-20 DIAGNOSIS — H9193 Unspecified hearing loss, bilateral: Secondary | ICD-10-CM

## 2013-12-20 MED ORDER — HYDROCHLOROTHIAZIDE 25 MG PO TABS
25.0000 mg | ORAL_TABLET | Freq: Every day | ORAL | Status: DC
Start: 2013-12-20 — End: 2014-02-20

## 2013-12-20 NOTE — Assessment & Plan Note (Signed)
Considered audiology vs. ENT but given tinnitus, Refer to ENT for further evaluation. I have viewed TM in past but did not reinspect today. Suspect tinnitus simply from sensorineural hearing loss.

## 2013-12-20 NOTE — Patient Instructions (Signed)
Hypertension - Take Hydrochlorothiazide, one pill daily, regularly. Follow up in 1 to 2 weeks to meet new physician and discuss how you are tolerating new medication.  Hearing - We will refer you to an [place] (ENT) doctor.   It was a pleasure caring for you this afternoon!

## 2013-12-20 NOTE — Progress Notes (Signed)
Garret Reddish, MD Phone: [phone]  Subjective:   [name] is a 57 y.o. year old very pleasant female patient who presents with the following:  Hypertension BP Readings from Last 3 Encounters:  12/20/13 169/75  12/02/13 152/72  10/31/13 164/80  Home BP monitoring-no Compliant with medications-yes without side effects, amlodipine and quinapril. Had been taken off metoprolol by bariatric surgeon.  ROS-Denies any CP, HA, SOB, blurry vision, LE edema worsening.   Hearing loss Slowly worsening over several years. States people have to repeat things for her and louder at times. She has not had her hearing checked. Does have some mild humming in her left ear. She thinks left hearing is worse than right.  ROS- no headaches. No ear pain. No history of trauma.   Also of note discussed with bariatric surgeon and decision made to avoid aspirin for now.   Past Medical History- Patient Active Problem List   Diagnosis Date Noted  . S/P bariatric surgery-duodenal switch with sleeve gastrectomy 12/04/2013    Priority: High  . Morbid obesity 08/26/2013    Priority: High  . DM (diabetes mellitus), type 2, uncontrolled 02/18/2008    Priority: High  . Nonobstructive CAD  08/26/2013    Priority: Medium  . At high risk for falls 06/12/2011    Priority: Medium  . HYPERTENSION 03/20/2010    Priority: Medium  . PERIPHERAL NEUROPATHY 01/12/2009    Priority: Medium  . DEPRESSION, CHRONIC 10/06/2007    Priority: Medium  . OBSTRUCTIVE SLEEP APNEA 06/12/2007    Priority: Medium  . GOITER, MULTINODULAR 05/13/2007    Priority: Medium  . Anemia 06/19/2012    Priority: Low  . CKD (chronic kidney disease), stage II 06/04/2012    Priority: Low  . Low back pain 03/05/2012    Priority: Low  . Osteoarthritis of knee 04/23/2011    Priority: Low  . UNSPECIFIED VENOUS INSUFFICIENCY 05/28/2010    Priority: Low  . PSORIASIS 05/28/2010    Priority: Low  . HYPERCHOLESTEROLEMIA 03/20/2010   Priority: Low  . DERMATITIS, HANDS 05/02/2009    Priority: Low  . Hearing loss 12/20/2013   Medications- reviewed and updated Current Outpatient Prescriptions  Medication Sig Dispense Refill  . amLODipine (NORVASC) 10 MG tablet TAKE 1 TABLET EVERY DAY  90 tablet  2  . atorvastatin (LIPITOR) 40 MG tablet Take 1 tablet (40 mg total) by mouth daily.  90 tablet  3  . gabapentin (NEURONTIN) 300 MG capsule TAKE 2 CAPSULES TWICE DAILY  (D/C  LYRICA)  360 capsule  2  . metFORMIN (GLUCOPHAGE) 1000 MG tablet TAKE 1 TABLET TWICE DAILY  WITH  A  MEAL  180 tablet  3  . quinapril (ACCUPRIL) 40 MG tablet Take 1 tablet (40 mg total) by mouth daily.  90 tablet  3  . venlafaxine XR (EFFEXOR-XR) 75 MG 24 hr capsule TAKE 3 CAPSULES EVERY DAY  270 capsule  3    Objective: BP 169/75  Pulse 56  Temp(Src) 98.3 F (36.8 C) (Oral)  Wt 263 lb (119.296 kg) Gen: NAD, resting comfortably on table  CV: RRR no murmurs rubs or gallops Lungs: CTAB no crackles, wheeze, rhonchi Abdomen: soft/nontender/nondistended/normal bowel sounds. No rebound or guarding.  Ext: no edema Skin: warm, dry Neuro: grossly normal, moves all extremities  Assessment/Plan:  Hearing loss Considered audiology vs. ENT but given tinnitus, Refer to ENT for further evaluation. I have viewed TM in past but did not reinspect today. Suspect tinnitus simply from sensorineural hearing loss.  HYPERTENSION Poorly controlled on quinapril and amlodipine alone. Added HCTZ. Check bmet. Follow up in 1-2 weeks. On exam and history, no signs end organ damage.    Orders Placed This Encounter  Procedures  . Basic metabolic panel   Meds ordered this encounter  Medications  . hydrochlorothiazide (HYDRODIURIL) 25 MG tablet    Sig: Take 1 tablet (25 mg total) by mouth daily.    Dispense:  30 tablet    Refill:  3

## 2013-12-20 NOTE — Assessment & Plan Note (Addendum)
Poorly controlled on quinapril and amlodipine alone. Added HCTZ. Check bmet. Follow up in 1-2 weeks. On exam and history, no signs end organ damage.

## 2013-12-21 LAB — BASIC METABOLIC PANEL
BUN: 10 mg/dL (ref 6–23)
CALCIUM: 9.7 mg/dL (ref 8.4–10.5)
CO2: 30 mEq/L (ref 19–32)
CREATININE: 0.88 mg/dL (ref 0.50–1.10)
Chloride: 100 mEq/L (ref 96–112)
Glucose, Bld: 218 mg/dL — ABNORMAL HIGH (ref 70–99)
Potassium: 3.7 mEq/L (ref 3.5–5.3)
Sodium: 140 mEq/L (ref 135–145)

## 2013-12-22 ENCOUNTER — Ambulatory Visit: Payer: Self-pay | Admitting: Bariatrics

## 2014-01-03 ENCOUNTER — Telehealth: Payer: Self-pay | Admitting: Family Medicine

## 2014-01-03 NOTE — Telephone Encounter (Signed)
Emergency Line / After Hours Call  Pt's husband called the Emergency Line because he is concerned about her blood pressure medicine. She had gastric bypass surgery on May 19th. At recent visit on June 29 with Dr. Yong Channel (previous PCP) she was started on new medication (HCTZ). He thinks she has been very dizzy on this medicine. Since Saturday has been stumbling with walking. She has not complained of CP, SOB, speech problems. Husband not able to provide reliable information regarding BP numbers at home (said one reading with systolic lower than diastolic).  Due to stumbling with walking over the last 1-2 das (which could represent posterior CVA) advised husband to bring pt to ER for further evaluation tonight. If they do not want to do that tonight, advised calling clinic for appointment in the AM to have her evaluated. Recommended that in the meantime it is okay to stop the HCTZ if she feels it is negatively affecting her.   Chrisandra Netters, MD Family Medicine PGY-3

## 2014-01-04 ENCOUNTER — Ambulatory Visit (INDEPENDENT_AMBULATORY_CARE_PROVIDER_SITE_OTHER): Payer: Commercial Managed Care - HMO | Admitting: Family Medicine

## 2014-01-04 ENCOUNTER — Encounter: Payer: Self-pay | Admitting: Family Medicine

## 2014-01-04 VITALS — BP 112/60 | HR 60 | Ht 68.0 in | Wt 257.0 lb

## 2014-01-04 DIAGNOSIS — I10 Essential (primary) hypertension: Secondary | ICD-10-CM

## 2014-01-04 DIAGNOSIS — M545 Low back pain, unspecified: Secondary | ICD-10-CM

## 2014-01-04 DIAGNOSIS — W19XXXA Unspecified fall, initial encounter: Secondary | ICD-10-CM | POA: Insufficient documentation

## 2014-01-04 DIAGNOSIS — Y92009 Unspecified place in unspecified non-institutional (private) residence as the place of occurrence of the external cause: Secondary | ICD-10-CM

## 2014-01-04 MED ORDER — IBUPROFEN 400 MG PO TABS: 400.0000 mg | ORAL_TABLET | Freq: Three times a day (TID) | ORAL | Status: DC | PRN

## 2014-01-04 NOTE — Assessment & Plan Note (Signed)
Likely due to hypotension. Patient instructed to continue to hold HCTZ. Continue home BP monitoring, if rising to follow up soon. F/U with PCP in 2 wks for reassessment. Fall precaution instruction given. Tail bone mildly tender but no sign of inflammation or swelling. Ibuprofen recommended prn pain.

## 2014-01-04 NOTE — Progress Notes (Signed)
Subjective:     Patient ID: [name], female   DOB: 04-06-1956, 58 y.o.   MRN: [id number]  HPI [id number] here for follow up after starting HCTZ in addition to other BP medications she is on,she stated since she started her HCTZ she has been feeling dizzy to the extent that she fell yesterday. She called the emergency line and was advised to stop her HCTZ. She stated she did not take her medication since yesterday. She continued to take other antihypertensive medication. Fall:Patient fell on her tail bone yesterday and has been having pain of about 8/10 in severity. She attributed this to use of HCTZ which makes her feel dizzy, she denies head injury, no LOC. Denies dizziness today. Back pain:C/O low back pain which has worsened since she fell yesterday, she stated pain has been on going for months. Patient is concern about this because she has to lift her husband most of the time. Pain is about 8/10 in severity, aching in nature, worse with ambulation and weight lifting.  Current Outpatient Prescriptions on File Prior to Visit  Medication Sig Dispense Refill  . ACCU-CHEK SMARTVIEW test strip CHECK BLOOD SUGARS 4 TIMES DAILY  100 each  12  . amLODipine (NORVASC) 10 MG tablet TAKE 1 TABLET EVERY DAY  90 tablet  2  . atorvastatin (LIPITOR) 40 MG tablet Take 1 tablet (40 mg total) by mouth daily.  90 tablet  3  . Blood Glucose Monitoring Suppl (ACCU-CHEK AVIVA PLUS) W/DEVICE KIT Please provide 1 device  1 kit  0  . gabapentin (NEURONTIN) 300 MG capsule TAKE 2 CAPSULES TWICE DAILY  (D/C  LYRICA)  360 capsule  2  . glucose blood (ACCU-CHEK ACTIVE STRIPS) test strip Quantity sufficient for checking blood sugar 4 times daily.  300 each  3  . glucose blood test strip Check 4x a day.  100 each  12  . hydrochlorothiazide (HYDRODIURIL) 25 MG tablet Take 1 tablet (25 mg total) by mouth daily.  30 tablet  3  . metFORMIN (GLUCOPHAGE) 1000 MG tablet TAKE 1 TABLET TWICE DAILY  WITH  A  MEAL  180 tablet  3  .  quinapril (ACCUPRIL) 40 MG tablet Take 1 tablet (40 mg total) by mouth daily.  90 tablet  3  . venlafaxine XR (EFFEXOR-XR) 75 MG 24 hr capsule TAKE 3 CAPSULES EVERY DAY  270 capsule  3  . [DISCONTINUED] Alum & Mag Hydroxide-Simeth (MAGIC MOUTHWASH W/LIDOCAINE) SOLN Take 5 mLs by mouth 3 (three) times daily as needed.  100 mL  0   No current facility-administered medications on file prior to visit.   Past Medical History  Diagnosis Date  . GOITER, MULTINODULAR 05/13/2007  . DIABETES MELLITUS, WITH NEUROLOGICAL COMPLICATIONS 0/93/8182  . HYPERCHOLESTEROLEMIA 03/20/2010  . DYSLIPIDEMIA 05/13/2007  . DEPRESSION, CHRONIC 10/06/2007  . OBSTRUCTIVE SLEEP APNEA 06/12/2007    uses CPAP  . PERIPHERAL NEUROPATHY 01/12/2009  . HYPERTENSION 03/20/2010  . UNSPECIFIED VENOUS INSUFFICIENCY 05/28/2010  . PSORIASIS 05/28/2010  . BACK PAIN, LUMBAR 11/19/2007  . ANEMIA, PERNICIOUS, HX OF 05/13/2007  . ASYMPTOMATIC POSTMENOPAUSAL STATUS 02/18/2008  . Hx of colonic polyps 12/04/2010  . Cellulitis of leg, right 10/11/2010  . NASH (nonalcoholic steatohepatitis)   . Cellulitis of left lower leg 10/28/2013      Review of Systems  Respiratory: Negative.   Cardiovascular: Negative.   Gastrointestinal: Negative.   Musculoskeletal: Positive for back pain.  Neurological: Negative for light-headedness and headaches.  All other systems reviewed and are  negative.  Filed Vitals:   01/04/14 1036  BP: 122/62  Pulse: 60  Height: 5' 8"  (1.727 m)  Weight: 257 lb (116.574 kg)       Objective:   Physical Exam  Nursing note and vitals reviewed. Constitutional: She appears well-developed. No distress.  Cardiovascular: Normal rate, regular rhythm, normal heart sounds and intact distal pulses.   No murmur heard. Pulmonary/Chest: Effort normal and breath sounds normal. No respiratory distress. She has no wheezes.  Abdominal: Soft. Bowel sounds are normal. She exhibits no distension and no mass. There is no tenderness.   Musculoskeletal:       Lumbar back: She exhibits decreased range of motion.  + mild bony tenderness around the tail bone. No bony tenderness of her lumbosacral joint. ROM of the spine reduced due to pain.       Assessment:     HTN Fall Back pain      Plan:     Check problem list

## 2014-01-04 NOTE — Assessment & Plan Note (Signed)
BP looks good despite not taking HCTZ. Plan to continue to hold HCTZ. Advised to continue home BP check. F/U in 2 wks with her PCP for reassessment.

## 2014-01-04 NOTE — Patient Instructions (Addendum)
It was nice seeing you today [name], your blood pressure looks good even without taking your new medication (HCTZ),please continue to hold your HCTZ and check your BP at home. If elevated please give [place] a call. Your fall can be due to low BP, as mentioned hold the HCTZ for now. Your tail bone pain should get better with time. Use Ibuprofen prn pain.

## 2014-01-04 NOTE — Assessment & Plan Note (Signed)
Ibuprofen 458m prn pain recommended. Prescription sent to her pharmacy. F/U with PCP for further management.

## 2014-01-21 ENCOUNTER — Ambulatory Visit (INDEPENDENT_AMBULATORY_CARE_PROVIDER_SITE_OTHER): Payer: Commercial Managed Care - HMO | Admitting: Family Medicine

## 2014-01-21 ENCOUNTER — Encounter: Payer: Self-pay | Admitting: Family Medicine

## 2014-01-21 VITALS — BP 128/78 | HR 63 | Wt 248.0 lb

## 2014-01-21 DIAGNOSIS — M170 Bilateral primary osteoarthritis of knee: Secondary | ICD-10-CM

## 2014-01-21 DIAGNOSIS — H9192 Unspecified hearing loss, left ear: Secondary | ICD-10-CM

## 2014-01-21 DIAGNOSIS — H905 Unspecified sensorineural hearing loss: Secondary | ICD-10-CM

## 2014-01-21 DIAGNOSIS — H9190 Unspecified hearing loss, unspecified ear: Secondary | ICD-10-CM

## 2014-01-21 DIAGNOSIS — IMO0001 Reserved for inherently not codable concepts without codable children: Secondary | ICD-10-CM

## 2014-01-21 DIAGNOSIS — E1165 Type 2 diabetes mellitus with hyperglycemia: Secondary | ICD-10-CM

## 2014-01-21 DIAGNOSIS — Z9884 Bariatric surgery status: Secondary | ICD-10-CM

## 2014-01-21 DIAGNOSIS — IMO0002 Reserved for concepts with insufficient information to code with codable children: Secondary | ICD-10-CM

## 2014-01-21 DIAGNOSIS — M1710 Unilateral primary osteoarthritis, unspecified knee: Secondary | ICD-10-CM

## 2014-01-21 LAB — POCT GLYCOSYLATED HEMOGLOBIN (HGB A1C): Hemoglobin A1C: 8.7

## 2014-01-21 MED ORDER — GLUCOSE BLOOD VI STRP: 1.0000 | ORAL_STRIP | Freq: Four times a day (QID) | Status: DC

## 2014-01-21 NOTE — Patient Instructions (Signed)
Good work on the weight and sugar control!  Your hemoglobin A1c is 8.7 today.  Follow up in a month and we can discuss whether you would like to have a referral for your knees

## 2014-01-21 NOTE — Progress Notes (Signed)
Patient ID: [name], female   DOB: 04/13/1956, 58 y.o.   MRN: [id number]   Subjective:    Patient ID: [name], female    DOB: 03/30/1956, 58 y.o.   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  Weight significantly down  Has had diarrhea since that time, followed up by surgeons and given additional medication (can't remember name of it)  Has tried OTC medications like loperamide ROS: no blood in stool, no abdominal pain  # Left hearing loss  Has had progressive hearing loss of left ear, has had prior visits where ENT referral was supposed to be ordered but hasn't had this done  No pain associated with the ear  No drainage/bleeding from ear  Review of Systems   See HPI for ROS. Objective:  BP 128/78  Pulse 63  Wt 248 lb (112.492 kg)  General: NAD, obese HEENT: Right TM pearly gray, minimal cerumen in canal. Left TM not visualized with apparent complete occlusion of ?impacted dark cerumen, no erythema of canal. Cardiac: RRR, normal heart sounds, no murmurs. 2+ radial and PT pulses bilaterally Respiratory: CTAB, normal effort Extremities: no edema or cyanosis. WWP. Mild generalized tenderness to palpation of both knees, ROM is largely intact, no effusions noted. Skin: warm and dry. Right lower shin with chronic changes related to old, severe cellulitis Neuro: alert and oriented, no focal deficits     Assessment & Plan:    See Problem List Documentation

## 2014-01-23 NOTE — Assessment & Plan Note (Signed)
Left sided hearing loss. On exam TM not visualized from suspected impacted cerumen on the TM.  Plan: irrigation rather than q-tips as she has been using, ENT referral.

## 2014-01-23 NOTE — Assessment & Plan Note (Signed)
Has persistent diarrhea after surgery Plan: deferred to surgeon as she has already been followed for this and started on medications.

## 2014-01-23 NOTE — Assessment & Plan Note (Signed)
Significant bilateral osteoarthritis on chart review of plain films done in 2014. Pt endorsing continued pain. Not interested in additional injections as they helped "for a little while" Plan: OTC pain regimen, went over some exercises (especially in pool for the next 1 week), f/u in 1 month and if she is still interested, ortho referral for surgical eval.

## 2014-01-23 NOTE — Assessment & Plan Note (Signed)
A1c 8.9, significantly improved compared to prior. CBGs by report appear to be better controlled, 150-250s while off insulin (previously on 200 units in the past). Foot exam today shows no sensation to both feet. Plan: continue metformin, encouraged continued weight loss and diet management.

## 2014-01-24 ENCOUNTER — Telehealth: Payer: Self-pay | Admitting: Gastroenterology

## 2014-01-24 NOTE — Telephone Encounter (Signed)
Pt was not available, husband states he will let pt know that I called.

## 2014-01-26 NOTE — Telephone Encounter (Signed)
Pt states that her middle sister was just diagnosed with colon cancer. Pt just had gastric bypass surgery and states she has seen some dark stools and is concerned. Pt scheduled to come see Dr. Deatra Ina in September. Pt aware of appt.

## 2014-02-18 ENCOUNTER — Ambulatory Visit (INDEPENDENT_AMBULATORY_CARE_PROVIDER_SITE_OTHER): Payer: Commercial Managed Care - HMO | Admitting: Family Medicine

## 2014-02-18 ENCOUNTER — Encounter: Payer: Self-pay | Admitting: Family Medicine

## 2014-02-18 VITALS — BP 163/79 | HR 66 | Temp 98.5°F | Wt 238.0 lb

## 2014-02-18 DIAGNOSIS — I10 Essential (primary) hypertension: Secondary | ICD-10-CM

## 2014-02-18 DIAGNOSIS — Z9884 Bariatric surgery status: Secondary | ICD-10-CM

## 2014-02-18 DIAGNOSIS — M170 Bilateral primary osteoarthritis of knee: Secondary | ICD-10-CM

## 2014-02-18 DIAGNOSIS — M1710 Unilateral primary osteoarthritis, unspecified knee: Secondary | ICD-10-CM

## 2014-02-18 MED ORDER — METOPROLOL SUCCINATE ER 25 MG PO TB24: 25.0000 mg | ORAL_TABLET | Freq: Every day | ORAL | Status: DC

## 2014-02-18 NOTE — Patient Instructions (Addendum)
Try decreasing the metformin to 1/2 tablet twice a day for 2 weeks. If symptoms improve, try decreasing to 1/2 tablet once a day for 2 weeks. If symptoms improve even more, try increasing again and see if the symptoms worsen. If they do, please call the clinic and get an appointment to see me and we will discuss other diabetes medications/check your A1c.  Another over the counter medication you can try is Loperamide 38m, over the counter. You can take up to 815ma day. Don't try this while you are decreasing or increasing the metformin.   Please schedule an appt for 2 weeks for a nursing visit blood pressure check

## 2014-02-18 NOTE — Progress Notes (Signed)
Patient ID: [name], female   DOB: [birth year], 58 y.o.   MRN: [id number]   Subjective:    Patient ID: [name], female    DOB: 05-Nov-1955, 58 y.o.   MRN: [id number]  HPI  CC: follow up  # Diarrhea:  Still having symptoms, seen by GI and taken off HCTZ.   Continues to use lomotil but without much change  Has had multiple episodes out in public where she loses control of bowels.  She is wondering if the metformin might be contributing  ROS: no bloody or dark stools  # Knee pain/osteoarthritis  Continues to have pain, working on exercises  Does not want to be referred to ortho at this time  # Hypertension  Stopped hctz after GI Visit  No CP, no SOb, no headache, no changes in vision  Review of Systems   See HPI for ROS. All other systems reviewed and are negative. Objective:  BP 163/79  Pulse 66  Temp(Src) 98.5 F (36.9 C) (Oral)  Wt 238 lb (107.956 kg) Vitals reviewed  General: NAD Cardiac: RRR, normal heart sounds, no murmurs. 2+ radial and PT pulses bilaterally  Respiratory: CTAB, normal effort  Extremities: no edema or cyanosis. WWP. Mild generalized tenderness to palpation of both knees, ROM is intact, no effusions appreciated.   Assessment & Plan:  See Problem List Documentation

## 2014-02-20 NOTE — Assessment & Plan Note (Signed)
BP elevated today after discontinuing HCTZ. No evidence of hypertensive urgency. On chart review she had toprol XL dose decreased after hospitalization 3 months ago, and discontinued at outpatient visit however no mention of reason for this. Patient denies any adverse effects of the medication for causing discontinuation. Patient's heart rate not bradycardic over past several visits. Plan: restart toprol xl 48m, nursing BP visit 2 weeks, f/u 2 months

## 2014-02-20 NOTE — Assessment & Plan Note (Signed)
Continued symptoms despite treatment, recently seen by GI.  Plan: given better control of diabetes and continued weight loss, will undergo trial of titrating/discontinuing metformin and restarting. If symptoms related to metformin, will have her follow up and will need to consider change in therapy.

## 2014-02-20 NOTE — Assessment & Plan Note (Signed)
Patient symptoms stable at this time. Plan: patient will get GI symptoms under control, and at that she will consider doing referral for possible joint replacement.

## 2014-03-04 ENCOUNTER — Ambulatory Visit (INDEPENDENT_AMBULATORY_CARE_PROVIDER_SITE_OTHER): Payer: Commercial Managed Care - HMO | Admitting: *Deleted

## 2014-03-04 VITALS — BP 150/72 | HR 52

## 2014-03-04 DIAGNOSIS — I10 Essential (primary) hypertension: Secondary | ICD-10-CM

## 2014-03-04 NOTE — Progress Notes (Signed)
   Pt in nurse clinic for blood pressure check.  BP 150/72, heart rate 52.  Pt stated she have had spells of dizziness recently, but not today.  Pt denies any chest pain, SOB, visual changes.  Pt has not taken any blood pressure medication today prior to appointment.  Will forward to PCP.  Derl Barrow, RN

## 2014-03-23 ENCOUNTER — Ambulatory Visit: Payer: Commercial Managed Care - HMO | Admitting: Gastroenterology

## 2014-04-14 ENCOUNTER — Ambulatory Visit (INDEPENDENT_AMBULATORY_CARE_PROVIDER_SITE_OTHER): Payer: Commercial Managed Care - HMO | Admitting: Family Medicine

## 2014-04-14 ENCOUNTER — Encounter: Payer: Self-pay | Admitting: Family Medicine

## 2014-04-14 VITALS — BP 138/60 | HR 78 | Temp 98.3°F | Wt 220.0 lb

## 2014-04-14 DIAGNOSIS — I10 Essential (primary) hypertension: Secondary | ICD-10-CM

## 2014-04-14 DIAGNOSIS — Z23 Encounter for immunization: Secondary | ICD-10-CM

## 2014-04-14 DIAGNOSIS — F32A Depression, unspecified: Secondary | ICD-10-CM

## 2014-04-14 DIAGNOSIS — F329 Major depressive disorder, single episode, unspecified: Secondary | ICD-10-CM

## 2014-04-14 DIAGNOSIS — E1165 Type 2 diabetes mellitus with hyperglycemia: Secondary | ICD-10-CM

## 2014-04-14 DIAGNOSIS — IMO0002 Reserved for concepts with insufficient information to code with codable children: Secondary | ICD-10-CM

## 2014-04-14 DIAGNOSIS — L309 Dermatitis, unspecified: Secondary | ICD-10-CM

## 2014-04-14 LAB — POCT GLYCOSYLATED HEMOGLOBIN (HGB A1C): HEMOGLOBIN A1C: 7.1

## 2014-04-14 MED ORDER — TRIAMCINOLONE ACETONIDE 0.5 % EX OINT: 1.0000 "application " | TOPICAL_OINTMENT | Freq: Two times a day (BID) | CUTANEOUS | Status: DC

## 2014-04-14 NOTE — Assessment & Plan Note (Signed)
On effexor x 3 years; dual benefit with neuropathic pain. No evidence of depression currently. Can attempt titrate off or continue. Will address again at next visit.

## 2014-04-14 NOTE — Progress Notes (Signed)
Patient ID: [name], female   DOB: 1955-08-24, 57 y.o.   MRN: [id number]   Subjective:    Patient ID: [name], female    DOB: 29-Jan-1956, 58 y.o.   MRN: [id number]  HPI  CC: f/u  # Hypertension:  Restarted toprol XL at last visit: tolerating well  Taking all meds: amlodipine, toprol, quinapril ROS: no syncope (some 10-15 seconds of lightheadedness occasionally), no CP, no SOB  # Diabetes  Recent CBG:  Checking 2-3 times a day. No lows.   Tried titrating metformin down and stopped for 1-2 weeks, no effect on her diarrhea, restarted and back on 1044m BID ROS: no polyuria/dysuria, no changes in vision  # Depression  Has been on effexor since 2012 (dual effect with neuropathic pain)  Denies any current feelings of depression  Has missed some doses and does notice a difference.  Would like to continue this for now  # Hand rash  Present for few years, off/on  Itchy, dry, skin cracks and bleeds  States the rash was almost resolved/at its best when she was consistently swimming in a pool  Review of Systems   See HPI for ROS. All other systems reviewed and are negative.  Past medical history, surgical, family, and social history reviewed and updated in the EMR as appropriate. Objective:  BP 138/60  Pulse 78  Temp(Src) 98.3 F (36.8 C) (Oral)  Wt 220 lb (99.791 kg) Vitals reviewed  General: NAD CV: RRR, normal s1/s2, no murmurs. 2+ radial and PT pulses bilaterally Resp: CTAB, normal effort Abdomen: soft, nontender, +BS Ext: no edema or cyanosis, WWP.  Assessment & Plan:  See Problem List Documentation

## 2014-04-14 NOTE — Assessment & Plan Note (Signed)
Advised moisturizer, medium potency topical steroid. F/u as needed.

## 2014-04-14 NOTE — Assessment & Plan Note (Signed)
At goal.  Plan: continue toprol xl 107m, amlodipine 181m quinapril 409mF/u 3 months

## 2014-04-14 NOTE — Patient Instructions (Addendum)
Your A1c was 7.1. Keep up the good work! You can stop checking your sugars so often a day, maybe once a day. If the a1c stays below 7.5 we can stop checking completely. Continue taking the metformin 1021m twice a day.  For rash:  Daily moisturizer like Eucerin cream. After 1-2 weeks try using the steroid cream 1-2 times a day. Thin layer.   See you again in 3 months.

## 2014-04-14 NOTE — Assessment & Plan Note (Signed)
A1c 7.1. Excellent control with weight loss and metformin. Advised to no longer check CBG 4 times a day, may do it 1 time a day and if A1c continues to be well controlled at next visit can stop completely. F/u 3 months

## 2014-05-25 ENCOUNTER — Encounter: Payer: Self-pay | Admitting: Gastroenterology

## 2014-05-25 ENCOUNTER — Ambulatory Visit (INDEPENDENT_AMBULATORY_CARE_PROVIDER_SITE_OTHER): Payer: Commercial Managed Care - HMO | Admitting: Gastroenterology

## 2014-05-25 ENCOUNTER — Other Ambulatory Visit: Payer: Commercial Managed Care - HMO

## 2014-05-25 VITALS — BP 156/78 | HR 68 | Ht 68.5 in | Wt 212.2 lb

## 2014-05-25 DIAGNOSIS — N823 Fistula of vagina to large intestine: Secondary | ICD-10-CM

## 2014-05-25 DIAGNOSIS — R197 Diarrhea, unspecified: Secondary | ICD-10-CM

## 2014-05-25 HISTORY — DX: Fistula of vagina to large intestine: N82.3

## 2014-05-25 MED ORDER — CHOLESTYRAMINE 4 G PO PACK: 4.0000 g | PACK | Freq: Three times a day (TID) | ORAL | Status: DC

## 2014-05-25 NOTE — Patient Instructions (Addendum)
You have been scheduled for a flexible sigmoidoscopy. Please follow the written instructions given to you at your visit today. If you use inhalers (even only as needed), please bring them with you on the day of your procedure.  Use 2 Imodium every 6 hours as needed Go to the basement for labs   Call if bloated or distended

## 2014-05-25 NOTE — Addendum Note (Signed)
Addended by: Oda Kilts on: 05/25/2014 04:48 PM   Modules accepted: Orders

## 2014-05-25 NOTE — Progress Notes (Signed)
_                                                                                                                History of Present Illness:  [name] 58 year old white female with diabetes, status post gastric bypass surgery, referred for evaluation of diarrhea.  Since her surgery in early 2015 has had severe diarrhea over 10 times a day with urgency and incontinence.  She will awaken at night to move her bowels.  She's tried Lomotil without much improvement.  She denies rectal bleeding or abdominal pain.  She has lost over 100 pounds since surgery .  She claims to be passing gas through her vagina and she has seen brown discharge as well.  She has not had any UTIs.  Adenomatous polyps were removed in 2012.   Past Medical History  Diagnosis Date  . GOITER, MULTINODULAR 05/13/2007  . DIABETES MELLITUS, WITH NEUROLOGICAL COMPLICATIONS 08/05/9415  . HYPERCHOLESTEROLEMIA 03/20/2010  . DYSLIPIDEMIA 05/13/2007  . DEPRESSION, CHRONIC 10/06/2007  . OBSTRUCTIVE SLEEP APNEA 06/12/2007    uses CPAP  . PERIPHERAL NEUROPATHY 01/12/2009  . HYPERTENSION 03/20/2010  . UNSPECIFIED VENOUS INSUFFICIENCY 05/28/2010  . PSORIASIS 05/28/2010  . BACK PAIN, LUMBAR 11/19/2007  . ANEMIA, PERNICIOUS, HX OF 05/13/2007  . ASYMPTOMATIC POSTMENOPAUSAL STATUS 02/18/2008  . Hx of colonic polyps 12/04/2010  . Cellulitis of leg, right 10/11/2010  . NASH (nonalcoholic steatohepatitis)   . Cellulitis of left lower leg 10/28/2013   Past Surgical History  Procedure Laterality Date  . Knee arthroscopy  2003    right  . Varicose vein surgery      Remotef  . Tonsillectomy    . Exercise myoview  01/24/2005  . Electrocardiogram  04/16/2006  . Rotator cuff repair  2008    right   family history includes Aneurysm in her mother; Cerebral aneurysm in her mother; Cirrhosis in her father; Colon cancer (age of onset: 71) in her father and sister; Hyperlipidemia in her father; Hypertension in her father. There is no  history of Drug abuse, CAD, or Stomach cancer. Current Outpatient Prescriptions  Medication Sig Dispense Refill  . amLODipine (NORVASC) 10 MG tablet TAKE 1 TABLET EVERY DAY 90 tablet 2  . atorvastatin (LIPITOR) 40 MG tablet Take 1 tablet (40 mg total) by mouth daily. 90 tablet 3  . Blood Glucose Monitoring Suppl (ACCU-CHEK AVIVA PLUS) W/DEVICE KIT Please provide 1 device 1 kit 0  . gabapentin (NEURONTIN) 300 MG capsule TAKE 2 CAPSULES TWICE DAILY  (D/C  LYRICA) 360 capsule 2  . glucose blood (ACCU-CHEK SMARTVIEW) test strip 1 each by Other route 4 (four) times daily. Use as instructed to check sugar 100 each 12  . ibuprofen (ADVIL,MOTRIN) 400 MG tablet Take 1 tablet (400 mg total) by mouth every 8 (eight) hours as needed. 30 tablet 0  . metFORMIN (GLUCOPHAGE) 1000 MG tablet TAKE 1 TABLET TWICE DAILY  WITH  A  MEAL 180 tablet 3  . metoprolol succinate (TOPROL-XL) 25 MG 24 hr  tablet Take 1 tablet (25 mg total) by mouth daily. 30 tablet 2  . quinapril (ACCUPRIL) 40 MG tablet Take 1 tablet (40 mg total) by mouth daily. 90 tablet 3  . triamcinolone ointment (KENALOG) 0.5 % Apply 1 application topically 2 (two) times daily. 30 g 0  . venlafaxine XR (EFFEXOR-XR) 75 MG 24 hr capsule TAKE 3 CAPSULES EVERY DAY 270 capsule 3  . [DISCONTINUED] Alum & Mag Hydroxide-Simeth (MAGIC MOUTHWASH W/LIDOCAINE) SOLN Take 5 mLs by mouth 3 (three) times daily as needed. 100 mL 0   No current facility-administered medications for this visit.   Allergies as of 05/25/2014 - Review Complete 05/25/2014  Allergen Reaction Noted  . Keflex [cephalexin] Hives   . Adhesive [tape] Rash 11/10/2011  . Neosporin [neomycin-bacitracin zn-polymyx] Rash     reports that she quit smoking about 19 years ago. Her smoking use included Cigarettes. She started smoking about 43 years ago. She has a 48 pack-year smoking history. She has never used smokeless tobacco. She reports that she does not drink alcohol or use illicit drugs.   Review  of Systems: Pertinent positive and negative review of systems were noted in the above HPI section. All other review of systems were otherwise negative.  Vital signs were reviewed in today's medical record Physical Exam: General: Well developed , well nourished, no acute distress Skin: anicteric Head: Normocephalic and atraumatic Eyes:  sclerae anicteric, EOMI Ears: Normal auditory acuity Mouth: No deformity or lesions Neck: Supple, no masses or thyromegaly Lungs: Clear throughout to auscultation Heart: Regular rate and rhythm; no murmurs, rubs or bruits Abdomen: Soft, non tender and non distended. No masses, hepatosplenomegaly or hernias noted. Normal Bowel sounds Rectal:deferred Musculoskeletal: Symmetrical with no gross deformities  Skin: No lesions on visible extremities Pulses:  Normal pulses noted Extremities: No clubbing, cyanosis, edema or deformities noted Neurological: Alert oriented x 4, grossly nonfocal Cervical Nodes:  No significant cervical adenopathy Inguinal Nodes: No significant inguinal adenopathy Psychological:  Alert and cooperative. Normal mood and affect  See Assessment and Plan under Problem List

## 2014-05-25 NOTE — Assessment & Plan Note (Signed)
Severe diarrhea with incontinence since undergoing gastric bypass surgery.  Topping syndrome with rapid transit and malabsorption are considerations.  Doubt infection although pseudomembranous colitis should be ruled out.  Recommendations #1 trial of Imodium 2 tabs every 6 hours #2 begin cholestyramine one packet 4 times a day #3 stool pathogen panel  Patient was carefully instructed to stop Imodium if she develops constipation or abdominal bloating

## 2014-05-25 NOTE — Assessment & Plan Note (Signed)
Symptoms of passage of air through the vagina and brown discharge raise the question of a fistula.  Recommendations #1 sigmoidoscopy #2 GYN exam #3 to consider CT the abdomen and pelvis pending results of above

## 2014-05-26 LAB — TISSUE TRANSGLUTAMINASE, IGA: Tissue Transglutaminase Ab, IgA: 1 U/mL (ref <4)

## 2014-05-26 LAB — GLIADIN ANTIBODIES, SERUM
GLIADIN IGA: 9 U/mL (ref <20)
Gliadin IgG: 2 U/mL (ref <20)

## 2014-05-27 ENCOUNTER — Encounter: Payer: Self-pay | Admitting: Family Medicine

## 2014-05-27 ENCOUNTER — Telehealth: Payer: Self-pay | Admitting: Gastroenterology

## 2014-05-27 ENCOUNTER — Encounter: Payer: Self-pay | Admitting: Gastroenterology

## 2014-05-27 LAB — RETICULIN ANTIBODIES, IGA W TITER: Reticulin Ab, IgA: NEGATIVE

## 2014-05-27 NOTE — Telephone Encounter (Signed)
Advised patient to take what dr Deatra Ina has directed her to take since he is the one treating her diarrhea. Okay to use Imodium or Imodium AD. Recommended she recollect the stool for pathogen since she was unable to get the specimen to the lab before 24 hrs.

## 2014-05-27 NOTE — Progress Notes (Unsigned)
Patient called and said that a referral has not been put in yet for her to have colonoscopy which is scheduled with [facility] next Tuesday.

## 2014-05-27 NOTE — Progress Notes (Unsigned)
Check silverback.  Referral has been put in as of 05/27/14 @ 8:05am.  At this time it is suspended, so we are waiting on approval from insurance.  LM with husband to have pt call back. Herbie Lehrmann, [name]

## 2014-05-31 ENCOUNTER — Ambulatory Visit (AMBULATORY_SURGERY_CENTER): Payer: Commercial Managed Care - HMO | Admitting: Gastroenterology

## 2014-05-31 ENCOUNTER — Encounter: Payer: Self-pay | Admitting: Gastroenterology

## 2014-05-31 ENCOUNTER — Other Ambulatory Visit: Payer: Commercial Managed Care - HMO

## 2014-05-31 VITALS — BP 165/77 | HR 47 | Temp 97.0°F | Resp 25 | Ht 68.5 in | Wt 212.0 lb

## 2014-05-31 DIAGNOSIS — K621 Rectal polyp: Secondary | ICD-10-CM

## 2014-05-31 DIAGNOSIS — N823 Fistula of vagina to large intestine: Secondary | ICD-10-CM

## 2014-05-31 DIAGNOSIS — R197 Diarrhea, unspecified: Secondary | ICD-10-CM

## 2014-05-31 DIAGNOSIS — D128 Benign neoplasm of rectum: Secondary | ICD-10-CM

## 2014-05-31 DIAGNOSIS — K599 Functional intestinal disorder, unspecified: Secondary | ICD-10-CM

## 2014-05-31 DIAGNOSIS — D129 Benign neoplasm of anus and anal canal: Secondary | ICD-10-CM

## 2014-05-31 LAB — GLUCOSE, CAPILLARY
GLUCOSE-CAPILLARY: 125 mg/dL — AB (ref 70–99)
Glucose-Capillary: 110 mg/dL — ABNORMAL HIGH (ref 70–99)

## 2014-05-31 MED ORDER — SODIUM CHLORIDE 0.9 % IV SOLN: 500.0000 mL | INTRAVENOUS | Status: DC

## 2014-05-31 NOTE — Patient Instructions (Addendum)
Discharge instructions given. Low carbohydrate diet given. Office visit in 4 weeks.l Resume previous medications.  Dumping Syndrome Diet Dumping syndrome is term used to describe a group of symptoms that occur when the stomach empties too quickly. Dumping syndrome usually happens after a stomach surgery or gastric bypass surgery for weight loss. The symptoms may occur 10 minutes to 3 hours after eating.  SYMPTOMS  Symptoms of the stomach emptying too quickly are:  Abdominal cramps.  Abdominal pain.  Nausea.  Feeling dizzy.  Feeling weak.  High heart rate.  Sweating. These symptoms can be controlled by changing the type of food you eat. Avoiding sweet and fatty foods can be helpful. Foods that are too sweet are known as concentrated sweets.  CHOOSING FOODS  Breads and Starches. These should be limited in general, depending on type of gastric surgery.  Allowed: Whole wheat bread, brown rice, whole grain pasta, corn, beans, potatoes without added fat.  Avoid: Sweet rolls, pastries, sugary cereals of any type. Vegetables  Allowed: Fresh, frozen, and canned vegetables.  Avoid: Creamed or breaded vegetables. Vegetables in a cheese sauce. Fruit  Allowed: All fresh, canned in natural juice or light syrup, or frozen fruits.  Avoid: Canned fruit in heavy syrup, commercially prepared fruit smoothies, fruit juice. Meat and Meat Substitutes  Allowed: Plain beef, chicken, fish, [place], lamb, veal, pork, ham. Eggs, soy meat substitutes, nuts.  Avoid: Creamed, breaded or fried meat, fish, or fowl. Full-fat sausage products. Milk  Allowed: Low-fat or fat-free milk, low-fat plain yogurt or light yogurt products.  Avoid: Milk (whole, 2%). Half and half, heavy cream, whipping cream. Soups and Combination Foods  Allowed: Bouillon, broth, vegetable soups, clear soups, consomms. Homemade soups made with low-fat ingredients  Avoid: Cream soups, chowders. Macaroni and cheese,  pizza. Desserts and Sweets  Allowed: Sugar-free gelatin, sugar-free pudding.  Avoid: All desserts including sugar-free pies, cakes, candies, cookies. Fats and Oils  Allowed: Small amounts of added fat (1 tsp) including butter, margarines, dressings, vegetable oils, shortening, mayonnaise, sugar-free nondairy cream. Reduced-fat cream cheese, low-fat sour cream, peanut butter (1 tbs).  Avoid: Bacon, cream, cream cheese, sour cream, fried foods. Beverages  Allowed: Sugar-free flavored water, decaf unsweetened tea and coffee. Sugar-free hot chocolate.  Avoid: Hot chocolate. Regular sugar and diet carbonated beverages, juice, any other regular sugar sweetened beverages Condiments  Allowed: Sugar substitutes, sugar-free jam and jelly, sugar-free syrup.  Avoid: Honey, sugar, syrup, jam, jelly, chocolate syrup, molasses, brown sugar, corn syrup, agave nectar. Document Released: 05/30/2011 Document Revised: 09/02/2011 Document Reviewed: 05/30/2011 [employer] Patient Information 2015 [place], [place]. This information is not intended to replace advice given to you by your health care provider. Make sure you discuss any questions you have with your health care provider.      YOU HAD AN ENDOSCOPIC PROCEDURE TODAY AT [facility]: Refer to the procedure report that was given to you for any specific questions about what was found during the examination.  If the procedure report does not answer your questions, please call your gastroenterologist to clarify.  If you requested that your care partner not be given the details of your procedure findings, then the procedure report has been included in a sealed envelope for you to review at your convenience later.  YOU SHOULD EXPECT: Some feelings of bloating in the abdomen. Passage of more gas than usual.  Walking can help get rid of the air that was put into your GI tract during the procedure and reduce the bloating. If  you had a lower  endoscopy (such as a colonoscopy or flexible sigmoidoscopy) you may notice spotting of blood in your stool or on the toilet paper. If you underwent a bowel prep for your procedure, then you may not have a normal bowel movement for a few days.  DIET: Your first meal following the procedure should be a light meal and then it is ok to progress to your normal diet.  A half-sandwich or bowl of soup is an example of a good first meal.  Heavy or fried foods are harder to digest and may make you feel nauseous or bloated.  Likewise meals heavy in dairy and vegetables can cause extra gas to form and this can also increase the bloating.  Drink plenty of fluids but you should avoid alcoholic beverages for 24 hours.  ACTIVITY: Your care partner should take you home directly after the procedure.  You should plan to take it easy, moving slowly for the rest of the day.  You can resume normal activity the day after the procedure however you should NOT DRIVE or use heavy machinery for 24 hours (because of the sedation medicines used during the test).    SYMPTOMS TO REPORT IMMEDIATELY: A gastroenterologist can be reached at any hour.  During normal business hours, 8:30 AM to 5:00 PM Monday through Friday, call [phone].  After hours and on weekends, please call the GI answering service at [phone] who will take a message and have the physician on call contact you.   Following lower endoscopy (colonoscopy or flexible sigmoidoscopy):  Excessive amounts of blood in the stool  Significant tenderness or worsening of abdominal pains  Swelling of the abdomen that is new, acute  Fever of 100F or higher  FOLLOW UP: If any biopsies were taken you will be contacted by phone or by letter within the next 1-3 weeks.  Call your gastroenterologist if you have not heard about the biopsies in 3 weeks.  Our staff will call the home number listed on your records the next business day following your procedure to check on you  and address any questions or concerns that you may have at that time regarding the information given to you following your procedure. This is a courtesy call and so if there is no answer at the home number and we have not heard from you through the emergency physician on call, we will assume that you have returned to your regular daily activities without incident.  SIGNATURES/CONFIDENTIALITY: You and/or your care partner have signed paperwork which will be entered into your electronic medical record.  These signatures attest to the fact that that the information above on your After Visit Summary has been reviewed and is understood.  Full responsibility of the confidentiality of this discharge information lies with you and/or your care-partner.

## 2014-05-31 NOTE — Op Note (Signed)
[facility]  [address] [place], [id number]   FLEXIBLE SIGMOIDOSCOPY PROCEDURE REPORT  PATIENT: [name], [name]  MR#: [id number] BIRTHDATE: [birth year] , 11  yrs. old GENDER: female ENDOSCOPIST: Inda Castle, MD REFERRED BY: PROCEDURE DATE:  05/31/2014 PROCEDURE:   Sigmoidoscopy with biopsy ASA CLASS:   Class II INDICATIONS:unexplained diarrhea. MEDICATIONS: Monitored anesthesia care and Propofol 180 mg IV  DESCRIPTION OF PROCEDURE:   After the risks benefits and alternatives of the procedure were thoroughly explained, informed consent was obtained.  Digital exam revealed no abnormalities of the rectum. The LB PCF-Q180 [id number]  endoscope was introduced through the anus  and advanced to the descending colon , The exam was Without limitations.    The quality of the prep was    .  The instrument was then slowly withdrawn as the mucosa was fully examined.         COLON FINDINGS: A sessile polyp measuring 2 mm in size was found in the rectum.  A polypectomy was performed with cold forceps.   The colon mucosa was otherwise normal.  Random biopsies were taken to rule out microscopic colitis  Retroflexed views revealed no abnormalities.    The scope was then withdrawn from the patient and the procedure terminated.  COMPLICATIONS: There were no immediate complications.  ENDOSCOPIC IMPRESSION: 1.   Sessile polyp was found in the rectum; polypectomy was performed with cold forceps 2.   The colon mucosa was otherwise normal  RECOMMENDATIONS: Await biopsy results low carbohydrate diet Imodium and cholestyramine GYN exam Office visit 4 weeks  REPEAT EXAM:  eSigned:  Inda Castle, MD 05/31/2014 3:11 PM   CC: Tawanna Sat, MD

## 2014-05-31 NOTE — Progress Notes (Signed)
Called to room to assist during endoscopic procedure.  Patient ID and intended procedure confirmed with present staff. Received instructions for my participation in the procedure from the performing physician.  

## 2014-05-31 NOTE — Progress Notes (Signed)
A/ox3 pleased with MAC, report to celia RN

## 2014-06-01 ENCOUNTER — Telehealth: Payer: Self-pay

## 2014-06-01 LAB — GASTROINTESTINAL PATHOGEN PANEL PCR
C. DIFFICILE TOX A/B, PCR: NEGATIVE
Campylobacter, PCR: NEGATIVE
Cryptosporidium, PCR: NEGATIVE
E COLI (STEC) STX1/STX2, PCR: NEGATIVE
E coli (ETEC) LT/ST PCR: NEGATIVE
E coli 0157, PCR: NEGATIVE
GIARDIA LAMBLIA, PCR: NEGATIVE
Norovirus, PCR: NEGATIVE
Rotavirus A, PCR: NEGATIVE
Salmonella, PCR: NEGATIVE
Shigella, PCR: NEGATIVE

## 2014-06-01 NOTE — Telephone Encounter (Signed)
Left message on answering machine. 

## 2014-06-07 ENCOUNTER — Encounter: Payer: Self-pay | Admitting: Gastroenterology

## 2014-06-27 ENCOUNTER — Encounter (HOSPITAL_COMMUNITY)
Admission: RE | Admit: 2014-06-27 | Discharge: 2014-06-27 | Disposition: A | Discharge status: Home / Self Care | Payer: Commercial Managed Care - HMO | Source: Ambulatory Visit | Attending: Obstetrics and Gynecology | Admitting: Obstetrics and Gynecology

## 2014-06-27 DIAGNOSIS — Z79899 Other long term (current) drug therapy: Secondary | ICD-10-CM | POA: Diagnosis not present

## 2014-06-27 DIAGNOSIS — Z87891 Personal history of nicotine dependence: Secondary | ICD-10-CM | POA: Diagnosis not present

## 2014-06-27 DIAGNOSIS — Z7982 Long term (current) use of aspirin: Secondary | ICD-10-CM | POA: Diagnosis not present

## 2014-06-27 DIAGNOSIS — N823 Fistula of vagina to large intestine: Secondary | ICD-10-CM | POA: Diagnosis present

## 2014-06-27 DIAGNOSIS — Z881 Allergy status to other antibiotic agents status: Secondary | ICD-10-CM | POA: Diagnosis not present

## 2014-06-27 DIAGNOSIS — R197 Diarrhea, unspecified: Secondary | ICD-10-CM | POA: Diagnosis not present

## 2014-06-27 DIAGNOSIS — Z9884 Bariatric surgery status: Secondary | ICD-10-CM | POA: Diagnosis not present

## 2014-06-27 LAB — BASIC METABOLIC PANEL
Anion gap: 6 (ref 5–15)
BUN: 15 mg/dL (ref 6–23)
CO2: 32 mmol/L (ref 19–32)
Calcium: 9.8 mg/dL (ref 8.4–10.5)
Chloride: 105 mEq/L (ref 96–112)
Creatinine, Ser: 0.84 mg/dL (ref 0.50–1.10)
GFR calc Af Amer: 86 mL/min — ABNORMAL LOW (ref >90)
GFR calc non Af Amer: 75 mL/min — ABNORMAL LOW (ref >90)
GLUCOSE: 139 mg/dL — AB (ref 70–99)
Potassium: 3.1 mmol/L — ABNORMAL LOW (ref 3.5–5.1)
SODIUM: 143 mmol/L (ref 135–145)

## 2014-06-27 LAB — CBC
HCT: 37.9 % (ref 36.0–46.0)
Hemoglobin: 11.9 g/dL — ABNORMAL LOW (ref 12.0–15.0)
MCH: 25.5 pg — AB (ref 26.0–34.0)
MCHC: 31.4 g/dL (ref 30.0–36.0)
MCV: 81.2 fL (ref 78.0–100.0)
PLATELETS: 198 10*3/uL (ref 150–400)
RBC: 4.67 MIL/uL (ref 3.87–5.11)
RDW: 14.6 % (ref 11.5–15.5)
WBC: 5.5 10*3/uL (ref 4.0–10.5)

## 2014-06-27 NOTE — Patient Instructions (Signed)
   Your procedure is scheduled on: Jun 29 2014 at 730am  Enter through the [facility] at:6am Pick up the phone at the desk and dial [phone] and inform us of your arrival.  Please call this number if you have any problems the morning of surgery: [phone]  Remember: Do not eat food after midnight: jan 5 Do not drink clear liquids after:jan 5 Take these medicines the morning of surgery with a SIP OF WATER: do not take metformin day of surgery.. Please take blood pressure as you normally do....  Do not wear jewelry, make-up, or FINGER nail polish No metal in your hair or on your body. Do not wear lotions, powders, perfumes.  You may wear deodorant.  Do not bring valuables to the hospital. Contacts, dentures or bridgework may not be worn into surgery.  Leave suitcase in the car. After Surgery it may be brought to your room. For patients being admitted to the hospital, checkout time is 11:00am the day of discharge.    Patients discharged on the day of surgery will not be allowed to drive home.

## 2014-06-28 ENCOUNTER — Encounter (HOSPITAL_COMMUNITY): Payer: Self-pay | Admitting: Obstetrics and Gynecology

## 2014-06-28 DIAGNOSIS — R151 Fecal smearing: Secondary | ICD-10-CM | POA: Diagnosis present

## 2014-06-28 NOTE — Anesthesia Preprocedure Evaluation (Addendum)
Anesthesia Evaluation  Patient identified by MRN, date of birth, ID band Patient awake    Reviewed: Allergy & Precautions, NPO status , Patient's Chart, lab work & pertinent test results, reviewed documented beta blocker date and time   Airway Mallampati: II  TM Distance: >3 FB Neck ROM: Full    Dental  (+) Edentulous Lower, Edentulous Upper   Pulmonary sleep apnea , former smoker,  breath sounds clear to auscultation  Pulmonary exam normal       Cardiovascular hypertension, Pt. on medications and Pt. on home beta blockers + CAD and + Peripheral Vascular Disease Rhythm:Regular Rate:Normal  Non obstructive CAD   Neuro/Psych PSYCHIATRIC DISORDERS Depression  Neuromuscular disease    GI/Hepatic (+) Hepatitis -, UnspecifiedS/P Gastric bypass   Endo/Other  diabetes, Well Controlled, Type 2, Oral Hypoglycemic Agents  Renal/GU Renal InsufficiencyRenal disease  negative genitourinary   Musculoskeletal  (+) Arthritis -, Osteoarthritis,  Chronic LBP   Abdominal (+) + obese,   Peds  Hematology  (+) anemia , Hx/o Pernicious anemia   Anesthesia Other Findings   Reproductive/Obstetrics Recto vaginal fistula                            Anesthesia Physical Anesthesia Plan  ASA: III  Anesthesia Plan: General   Post-op Pain Management:    Induction: Intravenous  Airway Management Planned: LMA  Additional Equipment:   Intra-op Plan:   Post-operative Plan: Extubation in OR  Informed Consent: I have reviewed the patients History and Physical, chart, labs and discussed the procedure including the risks, benefits and alternatives for the proposed anesthesia with the patient or authorized representative who has indicated his/her understanding and acceptance.   Dental advisory given  Plan Discussed with: CRNA, Anesthesiologist and Surgeon  Anesthesia Plan Comments:         Anesthesia Quick  Evaluation

## 2014-06-28 NOTE — H&P (Signed)
[name] is an 59 y.o. female [phone] presents to office s/p gastric bypass, weight loss of 104lbs, with diarrhea 1-10x/day.  Pt feels diarrhea/stool coming from vagina.  Advised to see GYN prn.  Pt describes explosive diarrhea.  She has folds of tissue in vulvar area and butticks given extreme weight loss.  Office evaluation did not reveal fistula, but "pieces" of stool present in upper vagina.  Feels that severely limits life.    Pertinent Gynecological History: Menses: post-menopausal Sexually transmitted diseases: no past history Previous GYN Procedures: DNC  Last pap: normal Date: 7/14 WNL, HR HPV neg OB History: [id number], s/p SAB, TAB, SVD x2 (9-10#)   Menstrual History:  No LMP recorded. Patient is postmenopausal.    Past Medical History  Diagnosis Date  . GOITER, MULTINODULAR 05/13/2007  . DIABETES MELLITUS, WITH NEUROLOGICAL COMPLICATIONS 08/13/2540  . HYPERCHOLESTEROLEMIA 03/20/2010  . DYSLIPIDEMIA 05/13/2007  . DEPRESSION, CHRONIC 10/06/2007  . OBSTRUCTIVE SLEEP APNEA 06/12/2007    uses CPAP  . PERIPHERAL NEUROPATHY 01/12/2009  . HYPERTENSION 03/20/2010  . UNSPECIFIED VENOUS INSUFFICIENCY 05/28/2010  . PSORIASIS 05/28/2010  . BACK PAIN, LUMBAR 11/19/2007  . ANEMIA, PERNICIOUS, HX OF 05/13/2007  . ASYMPTOMATIC POSTMENOPAUSAL STATUS 02/18/2008  . Hx of colonic polyps 12/04/2010  . Cellulitis of leg, right 10/11/2010  . NASH (nonalcoholic steatohepatitis)   . Cellulitis of left lower leg 10/28/2013  . Fecal soiling 06/28/2014    Past Surgical History  Procedure Laterality Date  . Knee arthroscopy  2003    right  . Varicose vein surgery      Remotef  . Tonsillectomy    . Exercise myoview  01/24/2005  . Electrocardiogram  04/16/2006  . Rotator cuff repair  2008    right  . Gastric bypass  11/09/2013    Family History  Problem Relation Age of Onset  . Hyperlipidemia Father   . Hypertension Father   . Cirrhosis Father   . Colon cancer Father 17  . Colon cancer Sister  38  . Aneurysm Mother   . Cerebral aneurysm Mother   . Drug abuse Neg Hx   . CAD Neg Hx   . Stomach cancer Neg Hx   . Colon cancer Sister     Social History:  reports that she quit smoking about 19 years ago. Her smoking use included Cigarettes. She started smoking about 44 years ago. She has a 48 pack-year smoking history. She has never used smokeless tobacco. She reports that she does not drink alcohol or use illicit drugs.married, homemaker, caregiver  Allergies:  Allergies  Allergen Reactions  . Keflex [Cephalexin] Hives    Denies Airway involvement  . Adhesive [Tape] Rash    Latex  . Neosporin [Neomycin-Bacitracin Zn-Polymyx] Rash    No prescriptions prior to admission  extensive med list: amlodipine, ASA, atorvastatin, cholestyramine, diphenoxylate atropine, gabapentin, HCTZ, ketorlac eyedrops, metformin, metoprolol, oflox, prilosec, prednisolone eye drops, quinipril, triamterence HCTZ, venlafexine  Review of Systems  Constitutional: Negative.   HENT: Negative.   Eyes: Negative.   Respiratory: Negative.   Cardiovascular: Negative.   Gastrointestinal: Positive for diarrhea.  Genitourinary: Negative.   Musculoskeletal: Negative.   Skin: Negative.   Neurological: Negative.   Psychiatric/Behavioral: Negative.     There were no vitals taken for this visit. Physical Exam  Constitutional: She is oriented to person, place, and time.  obese  HENT:  Head: Normocephalic and atraumatic.  Cardiovascular: Normal rate and regular rhythm.   Respiratory: Effort normal and breath sounds normal.  GI:  Soft. Bowel sounds are normal.  Obese, scarring from gastric bypass  Musculoskeletal: Normal range of motion.  Neurological: She is alert and oriented to person, place, and time.  Skin: Skin is warm and dry.  Psychiatric: She has a normal mood and affect. Her behavior is normal.    No results found for this or any previous visit (from the past 24 hour(s)).  No results  found.  Assessment/Plan: 58yo with fecal soiling, ? Rectovaginal fistula Exam under anesthesia - dye into rectum, see if leak into vagina D/w pt unable to find RV fistula on exam in office If unable to ID fistula or prove presence, will return to GI/Gen surgery   Bovard-Stuckert, Kamani Lewter 06/28/2014, 7:58 PM

## 2014-06-29 ENCOUNTER — Ambulatory Visit (HOSPITAL_COMMUNITY): Payer: Commercial Managed Care - HMO | Admitting: Anesthesiology

## 2014-06-29 ENCOUNTER — Encounter (HOSPITAL_COMMUNITY)
Admission: RE | Disposition: A | Discharge status: Home / Self Care | Payer: Self-pay | Source: Ambulatory Visit | Attending: Obstetrics and Gynecology

## 2014-06-29 ENCOUNTER — Ambulatory Visit (HOSPITAL_COMMUNITY)
Admission: RE | Admit: 2014-06-29 | Discharge: 2014-06-29 | Disposition: A | Discharge status: Home / Self Care | Payer: Commercial Managed Care - HMO | Source: Ambulatory Visit | Attending: Obstetrics and Gynecology | Admitting: Obstetrics and Gynecology

## 2014-06-29 DIAGNOSIS — Z881 Allergy status to other antibiotic agents status: Secondary | ICD-10-CM | POA: Insufficient documentation

## 2014-06-29 DIAGNOSIS — Z79899 Other long term (current) drug therapy: Secondary | ICD-10-CM | POA: Insufficient documentation

## 2014-06-29 DIAGNOSIS — R197 Diarrhea, unspecified: Secondary | ICD-10-CM | POA: Insufficient documentation

## 2014-06-29 DIAGNOSIS — Z7982 Long term (current) use of aspirin: Secondary | ICD-10-CM | POA: Diagnosis not present

## 2014-06-29 DIAGNOSIS — Z87891 Personal history of nicotine dependence: Secondary | ICD-10-CM | POA: Insufficient documentation

## 2014-06-29 DIAGNOSIS — E119 Type 2 diabetes mellitus without complications: Secondary | ICD-10-CM | POA: Diagnosis not present

## 2014-06-29 DIAGNOSIS — Z9884 Bariatric surgery status: Secondary | ICD-10-CM | POA: Diagnosis not present

## 2014-06-29 DIAGNOSIS — R151 Fecal smearing: Secondary | ICD-10-CM | POA: Diagnosis not present

## 2014-06-29 DIAGNOSIS — E669 Obesity, unspecified: Secondary | ICD-10-CM | POA: Diagnosis not present

## 2014-06-29 DIAGNOSIS — N898 Other specified noninflammatory disorders of vagina: Secondary | ICD-10-CM | POA: Diagnosis not present

## 2014-06-29 HISTORY — PX: EXAMINATION UNDER ANESTHESIA: SHX1540

## 2014-06-29 HISTORY — DX: Fecal smearing: R15.1

## 2014-06-29 LAB — GLUCOSE, CAPILLARY
Glucose-Capillary: 110 mg/dL — ABNORMAL HIGH (ref 70–99)
Glucose-Capillary: 138 mg/dL — ABNORMAL HIGH (ref 70–99)

## 2014-06-29 SURGERY — EXAM UNDER ANESTHESIA
Anesthesia: General | Site: Uterus

## 2014-06-29 MED ORDER — GLYCOPYRROLATE 0.2 MG/ML IJ SOLN
INTRAMUSCULAR | Status: DC | PRN
  Administered 2014-06-29: 0.2 mg via INTRAVENOUS

## 2014-06-29 MED ORDER — PROPOFOL 10 MG/ML IV BOLUS
INTRAVENOUS | Status: DC | PRN
  Administered 2014-06-29: 170 mg via INTRAVENOUS

## 2014-06-29 MED ORDER — LACTATED RINGERS IV SOLN
INTRAVENOUS | Status: DC
  Administered 2014-06-29: 07:00:00 via INTRAVENOUS

## 2014-06-29 MED ORDER — PROPOFOL 10 MG/ML IV BOLUS
INTRAVENOUS | Status: AC
  Filled 2014-06-29: qty 20

## 2014-06-29 MED ORDER — EPHEDRINE SULFATE 50 MG/ML IJ SOLN
INTRAMUSCULAR | Status: DC | PRN
  Administered 2014-06-29: 10 mg via INTRAVENOUS

## 2014-06-29 MED ORDER — MIDAZOLAM HCL 2 MG/2ML IJ SOLN
INTRAMUSCULAR | Status: DC | PRN
  Administered 2014-06-29: 1 mg via INTRAVENOUS

## 2014-06-29 MED ORDER — ATROPINE SULFATE 0.4 MG/ML IJ SOLN
INTRAMUSCULAR | Status: AC
Start: 2014-06-29 — End: 2014-06-29
  Filled 2014-06-29: qty 1

## 2014-06-29 MED ORDER — SCOPOLAMINE 1 MG/3DAYS TD PT72: 1.0000 | MEDICATED_PATCH | Freq: Once | TRANSDERMAL | Status: DC

## 2014-06-29 MED ORDER — LIDOCAINE HCL (CARDIAC) 20 MG/ML IV SOLN
INTRAVENOUS | Status: AC
  Filled 2014-06-29: qty 5

## 2014-06-29 MED ORDER — MIDAZOLAM HCL 2 MG/2ML IJ SOLN
INTRAMUSCULAR | Status: AC
  Filled 2014-06-29: qty 2

## 2014-06-29 MED ORDER — LIDOCAINE HCL (CARDIAC) 20 MG/ML IV SOLN
INTRAVENOUS | Status: DC | PRN
  Administered 2014-06-29: 60 mg via INTRAVENOUS

## 2014-06-29 MED ORDER — METHYLENE BLUE 1 % INJ SOLN
INTRAMUSCULAR | Status: DC | PRN
  Administered 2014-06-29: 1 mL

## 2014-06-29 MED ORDER — ATROPINE SULFATE 0.4 MG/ML IJ SOLN
INTRAMUSCULAR | Status: DC | PRN
  Administered 2014-06-29: 0.4 mg via INTRAVENOUS

## 2014-06-29 MED ORDER — GLYCOPYRROLATE 0.2 MG/ML IJ SOLN
INTRAMUSCULAR | Status: AC
  Filled 2014-06-29: qty 1

## 2014-06-29 MED ORDER — EPHEDRINE 5 MG/ML INJ
INTRAVENOUS | Status: AC
  Filled 2014-06-29: qty 10

## 2014-06-29 MED ORDER — METHYLENE BLUE 1 % INJ SOLN
INTRAMUSCULAR | Status: AC
  Filled 2014-06-29: qty 1

## 2014-06-29 MED ORDER — FENTANYL CITRATE 0.05 MG/ML IJ SOLN
INTRAMUSCULAR | Status: AC
  Filled 2014-06-29: qty 5

## 2014-06-29 MED ORDER — ONDANSETRON HCL 4 MG/2ML IJ SOLN
INTRAMUSCULAR | Status: DC | PRN
  Administered 2014-06-29: 4 mg via INTRAVENOUS

## 2014-06-29 MED ORDER — 0.9 % SODIUM CHLORIDE (POUR BTL) OPTIME
TOPICAL | Status: DC | PRN
  Administered 2014-06-29: 1000 mL

## 2014-06-29 MED ORDER — FENTANYL CITRATE 0.05 MG/ML IJ SOLN
INTRAMUSCULAR | Status: DC | PRN
  Administered 2014-06-29: 50 ug via INTRAVENOUS

## 2014-06-29 MED ORDER — ONDANSETRON HCL 4 MG/2ML IJ SOLN
INTRAMUSCULAR | Status: AC
  Filled 2014-06-29: qty 2

## 2014-06-29 MED ORDER — METOPROLOL SUCCINATE ER 25 MG PO TB24
25.0000 mg | ORAL_TABLET | Freq: Every day | ORAL | Status: DC
  Administered 2014-06-29: 25 mg via ORAL
  Filled 2014-06-29 (×2): qty 1

## 2014-06-29 SURGICAL SUPPLY — 16 items
CATH SILICONE 16FRX5CC (CATHETERS) ×1 IMPLANT
CLOTH BEACON ORANGE TIMEOUT ST (SAFETY) ×2 IMPLANT
COVER BACK TABLE 60X90IN (DRAPES) ×2 IMPLANT
COVER LIGHT HANDLE  1/PK (MISCELLANEOUS) ×2
COVER LIGHT HANDLE 1/PK (MISCELLANEOUS) IMPLANT
DRAPE SHEET LG 3/4 BI-LAMINATE (DRAPES) ×1 IMPLANT
DRAPE UNDERBUTTOCKS STRL (DRAPE) ×1 IMPLANT
GLOVE BIO SURGEON STRL SZ 6.5 (GLOVE) ×1 IMPLANT
GLOVE BIOGEL PI IND STRL 7.0 (GLOVE) IMPLANT
GLOVE BIOGEL PI INDICATOR 7.0 (GLOVE) ×1
GLOVE SURG SS PI 6.5 STRL IVOR (GLOVE) ×1 IMPLANT
GLOVE SURG SS PI 7.0 STRL IVOR (GLOVE) ×1 IMPLANT
GOWN STRL REUS W/TWL LRG LVL3 (GOWN DISPOSABLE) ×4 IMPLANT
LEGGING LITHOTOMY PAIR STRL (DRAPES) ×1 IMPLANT
SPONGE LAP 4X18 X RAY DECT (DISPOSABLE) ×2 IMPLANT
SYR 3ML 23GX1 SAFETY (SYRINGE) ×1 IMPLANT

## 2014-06-29 NOTE — Interval H&P Note (Signed)
History and Physical Interval Note:  06/29/2014 7:20 AM  [name]  has presented today for surgery, with the diagnosis of Rectovaginal Communication, [id number], [phone]  The various methods of treatment have been discussed with the patient and family. After consideration of risks, benefits and other options for treatment, the patient has consented to  Procedure(s): EXAM UNDER ANESTHESIA (N/A) as a surgical intervention .  The patient's history has been reviewed, patient examined, no change in status, stable for surgery.  I have reviewed the patient's chart and labs.  Questions were answered to the patient's satisfaction.     Bovard-Stuckert, Shawnee Higham

## 2014-06-29 NOTE — Transfer of Care (Signed)
Immediate Anesthesia Transfer of Care Note  Patient: [name]  Procedure(s) Performed: Procedure(s): EXAM UNDER ANESTHESIA (N/A)  Patient Location: PACU  Anesthesia Type:General  Level of Consciousness: awake, alert  and oriented  Airway & Oxygen Therapy: Patient Spontanous Breathing and Patient connected to nasal cannula oxygen  Post-op Assessment: Report given to PACU RN and Post -op Vital signs reviewed and stable  Post vital signs: Reviewed and stable  Complications: No apparent anesthesia complications

## 2014-06-29 NOTE — Anesthesia Procedure Notes (Signed)
Procedure Name: LMA Insertion Date/Time: 06/29/2014 7:29 AM Performed by: Elenore Paddy Pre-anesthesia Checklist: Patient identified, Emergency Drugs available, Suction available, Timeout performed and Patient being monitored Patient Re-evaluated:Patient Re-evaluated prior to inductionOxygen Delivery Method: Circle system utilized Preoxygenation: Pre-oxygenation with 100% oxygen Intubation Type: IV induction LMA: LMA inserted LMA Size: 4.0 Number of attempts: 1 Placement Confirmation: positive ETCO2 and breath sounds checked- equal and bilateral

## 2014-06-29 NOTE — Discharge Instructions (Signed)

## 2014-06-29 NOTE — Op Note (Signed)
NAMEGOLDIE, TREGONING                 ACCOUNT NO.:  [id number]  MEDICAL RECORD NO.:  [id number]  LOCATION:  [facility]                          FACILITY:  [facility]  PHYSICIAN:  Thornell Sartorius, MD        DATE OF BIRTH:  05/22/1956  DATE OF PROCEDURE:  06/29/2014 DATE OF DISCHARGE:                              OPERATIVE REPORT   PREOPERATIVE DIAGNOSIS:  Rectovaginal communication, fecal smearing.  POSTOPERATIVE DIAGNOSIS:  Intact rectovaginal septum.  PROCEDURE:  Exam under anesthesia.  SURGEON:  Thornell Sartorius, MD  ANESTHESIA:  General by MAC.  IV FLUIDS:  900 mL.  URINE OUTPUT:  None.  EBL:  None.  PATHOLOGY:  None.  COMPLICATIONS:  None.  DESCRIPTION OF PROCEDURE:  After informed consent was reviewed with the patient including what we are hoping to identify with the use of dye in the rectum, the patient states that she has stool in her vagina.  She was transported to the operating room, placed on the table in supine position.  General anesthesia was induced, found to be adequate.  She was then placed in the [place].  She was not prepped.  A large open-sided speculum was placed into her vagina.  A careful inspection revealed no stool and merely physiologic creamy white discharge.  The speculum was slowly removed.  No spill was noted.  With pressure on the rectum, no stool was forced out.  The several R2 4 x 8 sponges were placed in her vagina, clipped to the drape, and an Asepto was filled with methylene blue-dyed saline.  This was placed in her anus and approximately 60 mL of this were injected, some was extravasated through the anus.  Gloves were carefully watched to make sure no blue was on them and the sponges were removed from the vagina.  There was no blue dying on the sponges.  Using a retractor in the anterior aspect of the vagina, a rectal exam was performed visualizing the posterior wall of the vagina and again no stool in the vagina.  There was no communication  noted.  The patient was returned to supine position, awake and in stable condition, transferred to the PACU.  Sponge, lap, and needle counts were correct x2 per the staff.     Thornell Sartorius, MD     JB/MEDQ  D:  06/29/2014  T:  06/29/2014  Job:  [id number]

## 2014-06-29 NOTE — OR Nursing (Signed)
Pt states her physician released her from using c-pap last may. Dr. Lyndle Herrlich aware. No need to use sleep apnea protocol. Darin Engels rn

## 2014-06-29 NOTE — Brief Op Note (Signed)
06/29/2014  8:03 AM  PATIENT:  [name]  59 y.o. female  PRE-OPERATIVE DIAGNOSIS:  Rectovaginal Communication, [id number], [phone]  POST-OPERATIVE DIAGNOSIS:  intact rectal vaginal septum   PROCEDURE:  Procedure(s): EXAM UNDER ANESTHESIA (N/A)  SURGEON:  Surgeon(s) and Role:    * Janyth Contes, MD - Primary  ANESTHESIA:   general by MAC  EBL:  Total I/O In: 900 [I.V.:900] Out: - none  BLOOD ADMINISTERED:none  DRAINS: none   LOCAL MEDICATIONS USED:  NONE  SPECIMEN:  No Specimen  DISPOSITION OF SPECIMEN:  N/A  COUNTS:  YES  TOURNIQUET:  * No tourniquets in log *  DICTATION: .Other Dictation: Dictation Number [id number]  PLAN OF CARE: Discharge to home after PACU  PATIENT DISPOSITION:  PACU - hemodynamically stable.   Delay start of Pharmacological VTE agent (>24hrs) due to surgical blood loss or risk of bleeding: not applicable

## 2014-06-30 ENCOUNTER — Encounter (HOSPITAL_COMMUNITY): Payer: Self-pay | Admitting: Obstetrics and Gynecology

## 2014-06-30 NOTE — Anesthesia Postprocedure Evaluation (Signed)
  Anesthesia Post-op Note  Patient: [name]  Procedure(s) Performed: Procedure(s): EXAM UNDER ANESTHESIA (N/A) Patient is awake and responsive. Pain and nausea are reasonably well controlled. Vital signs are stable and clinically acceptable. Oxygen saturation is clinically acceptable. There are no apparent anesthetic complications at this time. Patient is ready for discharge.

## 2014-07-11 ENCOUNTER — Ambulatory Visit: Payer: Commercial Managed Care - HMO | Admitting: Gastroenterology

## 2014-07-13 ENCOUNTER — Ambulatory Visit: Payer: Commercial Managed Care - HMO | Admitting: Family Medicine

## 2014-08-02 ENCOUNTER — Ambulatory Visit: Payer: Commercial Managed Care - HMO | Admitting: Family Medicine

## 2014-08-03 ENCOUNTER — Other Ambulatory Visit: Payer: Self-pay | Admitting: Family Medicine

## 2014-08-03 DIAGNOSIS — Z1231 Encounter for screening mammogram for malignant neoplasm of breast: Secondary | ICD-10-CM

## 2014-08-26 ENCOUNTER — Ambulatory Visit (INDEPENDENT_AMBULATORY_CARE_PROVIDER_SITE_OTHER): Payer: Commercial Managed Care - HMO | Admitting: Family Medicine

## 2014-08-26 ENCOUNTER — Encounter: Payer: Self-pay | Admitting: Family Medicine

## 2014-08-26 VITALS — BP 129/70 | HR 59 | Ht 69.0 in | Wt 195.0 lb

## 2014-08-26 DIAGNOSIS — H9193 Unspecified hearing loss, bilateral: Secondary | ICD-10-CM

## 2014-08-26 DIAGNOSIS — I10 Essential (primary) hypertension: Secondary | ICD-10-CM

## 2014-08-26 DIAGNOSIS — E7800 Pure hypercholesterolemia, unspecified: Secondary | ICD-10-CM

## 2014-08-26 DIAGNOSIS — IMO0002 Reserved for concepts with insufficient information to code with codable children: Secondary | ICD-10-CM

## 2014-08-26 DIAGNOSIS — G629 Polyneuropathy, unspecified: Secondary | ICD-10-CM | POA: Diagnosis not present

## 2014-08-26 DIAGNOSIS — E1165 Type 2 diabetes mellitus with hyperglycemia: Secondary | ICD-10-CM | POA: Diagnosis not present

## 2014-08-26 DIAGNOSIS — F329 Major depressive disorder, single episode, unspecified: Secondary | ICD-10-CM | POA: Diagnosis not present

## 2014-08-26 DIAGNOSIS — F32A Depression, unspecified: Secondary | ICD-10-CM

## 2014-08-26 LAB — POCT GLYCOSYLATED HEMOGLOBIN (HGB A1C): Hemoglobin A1C: 6.5

## 2014-08-26 MED ORDER — ATORVASTATIN CALCIUM 40 MG PO TABS: 40.0000 mg | ORAL_TABLET | Freq: Every day | ORAL | Status: DC

## 2014-08-26 MED ORDER — METFORMIN HCL 1000 MG PO TABS: ORAL_TABLET | ORAL | Status: DC

## 2014-08-26 MED ORDER — AMLODIPINE BESYLATE 10 MG PO TABS: 10.0000 mg | ORAL_TABLET | Freq: Every day | ORAL | Status: DC

## 2014-08-26 MED ORDER — QUINAPRIL HCL 40 MG PO TABS: 40.0000 mg | ORAL_TABLET | Freq: Every day | ORAL | Status: DC

## 2014-08-26 MED ORDER — GABAPENTIN 300 MG PO CAPS: 900.0000 mg | ORAL_CAPSULE | Freq: Three times a day (TID) | ORAL | Status: DC

## 2014-08-26 MED ORDER — METOPROLOL SUCCINATE ER 25 MG PO TB24: 25.0000 mg | ORAL_TABLET | Freq: Every day | ORAL | Status: DC

## 2014-08-26 MED ORDER — GLUCOSE BLOOD VI STRP: ORAL_STRIP | Status: DC

## 2014-08-26 NOTE — Progress Notes (Signed)
   Subjective:    Patient ID: [name], female    DOB: 08-08-1955, 59 y.o.   MRN: [id number]  HPI  CC: follow up  # Diabetes:  Doing well with current medications, taking as prescribed with no missed doses  Ran out of test strips, so no recent tests ROS: lower extremity pain/neuropathy at baseline, no changes in vision  # Weight loss  S/p bariatric surgery  Down 10lbs since January visit  Still having issues with diarrhea/incontinence... Had a study to evaluate for recto-vaginal fistula but none was found ROS: diarrhea  # Hypertension  Adherent to medication regimen  Doing well and no concerns ROS: no CP, no SOB  # Hair loss  Has been steadily losing hair over the past 1+ year.  Trying some OTC vitamin supplements recommended by daughter  # Depression  Doing well, no current issues  Still has significant stressors with taking care of husband and his health issues  Taking effexor 3 81m tablets a day  Review of Systems   See HPI for ROS. All other systems reviewed and are negative.  Past medical history, surgical, family, and social history reviewed and updated in the EMR as appropriate. Objective:  BP 129/70 mmHg  Pulse 59  Ht 5' 9"  (1.753 m)  Wt 195 lb (88.451 kg)  BMI 28.78 kg/m2 Vitals and nursing note reviewed  General: NAD, pleasant HEENT: Both TMs obstructed by cerumen too deep to attempt removal CV: RRR, normal s1s2 no mrg appreciated Resp: CTAB normal effort Ext: no edema or cyanosis  Assessment & Plan:  See Problem List Documentation

## 2014-08-26 NOTE — Patient Instructions (Addendum)
Debrox over the counter ear drops  Use the shower head to run water in the ears every day for 10-15 seconds. Don't use QTIPs because this pushes the earwax back into the canal  Try spray bottle with specific nozzle to spray more water into the ear:  Elephant Ear Washer Bottle System by [employer] - Ear Wax Remover  from [employer]   Or Rhino Ear Washer.  Can buy [employer] or ask pharmacy    Do the above for at least 1 month, if still not better come back and we can look in again and see

## 2014-08-28 NOTE — Assessment & Plan Note (Signed)
Stable. Taking 984m gabapentin TID, still getting relief with this. No changes.

## 2014-08-28 NOTE — Assessment & Plan Note (Signed)
Stable. Taking 229m effexor daily. Recommended to her to titrate down to 1571mdaily, if stable provide 15044mablets on next refill.

## 2014-08-28 NOTE — Assessment & Plan Note (Signed)
Pt continues to do well. A1c 6.5 today. On metformin. Recommended patient stop testing CBG. F/u 3 months.

## 2014-08-28 NOTE — Assessment & Plan Note (Addendum)
Stable. At goal. Pt brings in older medication no longer on EMR list triamterene-hctz, instructed to discontinue this and continue quinapril, toprol xl, amlodipine. Record BP as outpatient and if elevated schedule an appt sooner.

## 2014-08-29 NOTE — Assessment & Plan Note (Signed)
Continues to complain of hearing loss. Both TMs with impact cerumen unable to remove in office. Debrox and copious irrigation, no Qtips, return in 1 month if unable to remove and reassess if able to manually remove with scraper.

## 2014-08-31 ENCOUNTER — Ambulatory Visit (HOSPITAL_COMMUNITY)
Admission: RE | Admit: 2014-08-31 | Discharge: 2014-08-31 | Disposition: A | Discharge status: Home / Self Care | Payer: Commercial Managed Care - HMO | Source: Ambulatory Visit | Attending: Family Medicine | Admitting: Family Medicine

## 2014-08-31 DIAGNOSIS — Z1231 Encounter for screening mammogram for malignant neoplasm of breast: Secondary | ICD-10-CM | POA: Diagnosis not present

## 2014-09-09 ENCOUNTER — Telehealth: Payer: Self-pay | Admitting: Family Medicine

## 2014-09-09 DIAGNOSIS — M170 Bilateral primary osteoarthritis of knee: Secondary | ICD-10-CM

## 2014-09-09 NOTE — Telephone Encounter (Signed)
Husband called and requesting a referral for his wife to see an ortho doctor about her knees. Please call when this is done. jw

## 2014-09-09 NOTE — Telephone Encounter (Signed)
Will forward to MD. Yvanna Vidas,CMA  

## 2014-09-12 NOTE — Telephone Encounter (Signed)
Referral order made. Moderate to severe tricompartmental disease noted on x-rays 2014. See note dated 01/21/2014 regarding patient preference to defer knee replacement consultation until further out from gastric bypass surgery.

## 2014-10-15 NOTE — Consult Note (Signed)
PATIENT NAME:  [name], [name] MR#:  [id number] DATE OF BIRTH:  [birth year]  DATE OF CONSULTATION:  11/09/2013  REFERRING PHYSICIAN:  Dr. Duke Salvia CONSULTING PHYSICIAN:  Epifanio Lesches, MD  PRIMARY CARE PHYSICIAN: Nonlocal.  REASON FOR CONSULTATION: Diabetes management.  HISTORY OF PRESENT ILLNESS: This is a 59 year old obese who had gastric bypass today, and I was asked to oversee the patient for diabetes management. The patient right now has pain and she is started on PCA pump for postoperative pain. The patient's blood sugars have been running around 300s and we are asked to see the patient. The patient right now is n.p.o. The patient is right now on sliding scale with coverage. She is on NovoLog before meals and at bedtime with coverage.  PAST MEDICAL HISTORY: Significant for hypertension, diabetes mellitus type 2, hyperlipidemia, and depression.  HOME MEDICATIONS: Venlafaxine 75 mg p.o. daily, quinapril 40 mg p.o. daily, Novolin 70/30 (she says she takes 80 units in the morning and 100 units at night), metformin 1 gram p.o. b.i.d., Neurontin 300 mg p.o. t.i.d., atorvastatin 40 mg p.o. daily, aspirin 81 mg p.o. daily, and amlodipine 10 mg p.o. daily.   PAST SURGICAL HISTORY: Significant for knee arthroplasty, vein stripping on the right leg and rotator cuff repair.   SOCIAL HISTORY: Quit smoking. No alcohol. No drugs.   ALLERGIES: KEFLEX AND NEOSPORIN.   FAMILY HISTORY: No hypertension or diabetes.   PRESENT MEDICATIONS:  1.  Lovenox 40 mg subcu b.i.d. 2.  Morphine PCA. 3.  NovoLog insulin sliding scale with coverage before meals and at bedtime; she is on low-dose coverage with 2/4/11/29/08 units.  4.  IV fluids 1/2 normal saline with KCl 125 mL/h. 5.  Phenergan 12.5 mg IV q. 4 hours p.r.n. for nausea and vomiting.   DIET: She is n.p.o. The patient is supposed to have upper GI series tomorrow. After that she will be probably started on a diet.   REVIEW OF SYSTEMS: Right now she is in  pain because of surgery, unable to give me any complaints, but not appearing to be in any respiratory distress.  PHYSICAL EXAMINATION:  VITAL SIGNS: Temperature 97.5, heart rate 67, blood pressure 151/82, sats 93% on room air.  GENERAL: She is an alert, awake, oriented, obese female in slight discomfort because of the pain and complains of dry mouth. HEENT: Pupils equally reacting to light. Extraocular movements are intact. No conjunctivitis. Hearing is intact. Mucous membranes are dry.  NECK: Supple. No JVD. No carotid bruit. Normal range of motion.  RESPIRATORY: Clear to auscultation. No wheeze. No rales.  CARDIOVASCULAR: S1, S2 regular. PMI is not displaced. Good femoral and pedal pulses. No pedal edema.  ABDOMEN: The patient is status post surgery with lap band and biliopancreatic diversion.  MUSCULOSKELETAL: The patient's strength is 5/5 in upper extremities and lower extremities.  SKIN: No skin rashes.  NEUROLOGIC: Cranial nerves II through XII intact. DTRs 2+ bilateral. No dysarthria or dysphagia.  PSYCH: Mood and affect are within normal limits.   DIAGNOSTIC DATA: Laboratory: WBC yesterday 8.4, hemoglobin 12.6, hematocrit 39.4, and platelets 283,000.  Electrolytes: Sodium 136, potassium 4, chloride 99, bicarb 32, BUN 22, creatinine 1.14, glucose 365. These labs are from May 18th. Blood sugars today morning, 290. They are ranging between 330 to 320. The patient received multiple doses of insulin Aspart. She received a total of 73 units of insulin Aspart from morning to afternoon.   ASSESSMENT AND PLAN: A 59 year old female patient with morbid obesity status post gastric bypass,  found to have elevated blood sugars. The patient has a history of diabetes mellitus type 2. Now n.p.o. so I would recommend continuing holding the p.o. metformin and NovoLog 70/30. Continue her on sliding scale with coverage, but she needs more frequent monitoring with every 6 hour coverage with high-dose insulin,  which is 2/4/11/29/08 units of regular insulin. The patient will be followed by [place] every day and make further recommendations.   The patient's other diagnoses include hypertension and hyperlipidemia. The patient right now is n.p.o. so hold off all the p.o. medications and if needed can use hydralazine 20 mg q. 6 hours p.r.n. for systolic blood pressure more than 160/90.   TIME SPENT ON THIS CONSULTATION: About 55 minutes.   ____________________________ Epifanio Lesches, MD sk:sb D: 11/09/2013 14:52:23 ET T: 11/09/2013 15:18:34 ET JOB#: [id number]  cc: Epifanio Lesches, MD, <Dictator> Epifanio Lesches MD ELECTRONICALLY SIGNED 11/18/2013 12:49

## 2014-10-15 NOTE — Op Note (Signed)
PATIENT NAME:  [name], [name] MR#:  [id number] DATE OF BIRTH:  1955-09-07  DATE OF PROCEDURE:  11/09/2013  PROCEDURE PERFORMED: Laparoscopic duodenal switch with biliary pancreatic diversion with loop ileostomy, reduction of incarcerated omentum with lysis of adhesions and repair of umbilical hernia.   SURGEON: Arletta Bale, M.D.  ASSISTANT: Liliane Bade, PA.  ESTIMATED BLOOD LOSS: 50 mL.  SURGICAL SPECIMEN: Lateral stomach.   PREOPERATIVE DIAGNOSES: Long-standing morbid obesity associated with diabetes and hypertension along with obstructive sleep apnea.  POSTPROCEDURE DIAGNOSES: Long-standing morbid obesity associated with diabetes and hypertension along with obstructive sleep apnea with incarcerated umbilical hernia containing portions of omentum and periumbilical omental adhesions. Also, Spigelian hernia in the right lower quadrant.   DESCRIPTION OF PROCEDURE: The patient was brought to the operating room and placed in the supine position. General anesthesia was obtained with orotracheal intubation. The abdomen was sterilely prepped and draped after a Foley catheter inserted. TED hose and Thromboguards were applied. The patient had a 5 mm Optiview trocar introduced in the left upper quadrant of the abdomen under direct visualization. Four additional trocars were introduced across the upper abdomen. The patient was noted to have omental adhesions in the periumbilical area. To access these, an additional trocar was introduced in the right lower quadrant of the abdomen. The omental adhesions were taken down by use of the Harmonic scalpel. It was then followed by identification of a 1 cm fascial defect at the level of the umbilicus. Within it was portions of the omentum, which were reduced. This allowed mobilizing the omentum out of the central abdominal area. The ileocecal valve was identified. The bowel was then followed proximally 300 cm, at which point it was secured to the gastrocolic ligament.  Division of the arcade vessels along the distal greater curvature of the stomach was then performed. This was followed by mobilizing the inner curve of the duodenum to a point approximately 3 cm inferior to the pylorus. Blunt dissection used to create a window on the outer curve of the duodenum, at which point a blue load GI stapler was used to divide the duodenum. Some of the lateral attachments of the outer curve of the proximal duodenum were divided by use of the Harmonic scalpel, allowing increased mobility of the proximal duodenum.   At this point, a ileal duodenal anastomosis was performed. This was done with a 2 layered approach with running 2-0 Vicryl approximating the seromuscular aspects of the duodenum to the ileum. Enterotomies were made on the antimesenteric border of the ileum and the anterior aspect of the duodenum. A running 3-0 Vicryl suture was used to complete the inner layer of the anastomosis and over running of the anterior aspect then performed with an additional running 3-0 Vicryl suture. Insufflation of the stomach with occlusion of the ileum proximal and distal anastomosis was performed. There was no air leak identified. Next, a lateral gastrectomy was performed. This was accomplished with elevation of the left lobe of the liver. In the process, there was noted to be a hiatal hernia previous identified by way of the upper GI series. This was managed by division of the upper aspect of the gastrohepatic ligament and division of the peritoneum across the anterior hiatus. Blunt dissection was used to reduce the herniated peritoneum and mobilize the anterior esophagus away from the underlying pericardium. The peritoneum was incised along the lateral margin of the right crural musculature. Blunt dissection used to reduce herniated lesser sac fatty tissue. Phrenoesophageal ligaments divided along the left crural  margin and blunt dissection was performed circumferential to the distal esophagus  freeing it from the pleural surfaces, the esophagus and the overlying pericardium. This extended into the lower mediastinum over a distance of approximately 6 to 7 cm resulting in delivery of 1.5 cm of esophagus in the abdominal cavity.   Posterior crural repair was then performed with 2 interrupted 0 Ethibond sutures. Next, additional vascular pedicles were divided along the more distal aspect of the greater curvature of the stomach. A 40-French ViSiGi device was deployed in the area of the antrum and the stomach decompressed. It was then followed by a series of GIA staple firings to create a medially based gastric tube effect. The first firing was a black load stapler followed by a series of green load staples followed by tan load staples. The staple line ultimately being brought out just lateral to the angle of His leaving a small dog ear of stomach. This dog ear of the stomach was then pexied to the left crural margin to prevent proximal migration into the lower mediastinum. Again, the ViSiGi device was used for insufflation of the area of the anastomosis while along the staple line a saline bath was performed. No air leak identified. At this time, no additional intervention was felt to be necessary at the region of the gastric procedure. The umbilicus was addressed. A small incision was made in the inferior fold of the umbilicus. Two 0 Vicryl sutures were placed with a needle suture system to approximate the fascial margins of the umbilicus. A similar suture was used to close the 12 mm trocar site in the right upper quadrant. This was done under direct visualization. It should noted that the lateral stomach was retrieved through this site prior to closure of the fascia and reduction of the pneumoperitoneum. At this point, the residual pneumoperitoneum was relieved. The wound was injected with 0.25% Marcaine with epinephrine and closed with 4-0 Monocryl in the dermis followed by Dermabond.    ____________________________ Venia Carbon. Duke Salvia, MD mat:aw D: 11/11/2013 06:30:03 ET T: 11/11/2013 06:58:43 ET JOB#: [id number]  cc: Legrand Como A. Duke Salvia, MD, <Dictator> Ladora Daniel MD ELECTRONICALLY SIGNED 11/23/2013 8:08

## 2014-11-09 ENCOUNTER — Telehealth: Payer: Self-pay | Admitting: Family Medicine

## 2014-11-09 DIAGNOSIS — E1165 Type 2 diabetes mellitus with hyperglycemia: Secondary | ICD-10-CM

## 2014-11-09 DIAGNOSIS — IMO0002 Reserved for concepts with insufficient information to code with codable children: Secondary | ICD-10-CM

## 2014-11-09 NOTE — Telephone Encounter (Signed)
[name] is calling on behalf of [name] and she needs a [employer] referral for this patient to be seen tomorrow afternoon, if possible.

## 2014-11-09 NOTE — Telephone Encounter (Signed)
Referral to optometry placed.

## 2014-11-09 NOTE — Telephone Encounter (Signed)
Will forward to MD to place referral for patient.  Will also send to referral coordinator so she is aware.  Taiwan Talcott,CMA

## 2014-11-09 NOTE — Telephone Encounter (Signed)
Jerene Pitch is calling on behalf of [facility] and she needs a [facility] referral for this patient to be seen tomorrow afternoon, if possible. Address: [address], [place], [place] [address]; Phone: [phone]. Thank you, Fonda Kinder, ASA

## 2014-11-10 DIAGNOSIS — H35363 Drusen (degenerative) of macula, bilateral: Secondary | ICD-10-CM | POA: Diagnosis not present

## 2014-11-10 DIAGNOSIS — H5210 Myopia, unspecified eye: Secondary | ICD-10-CM | POA: Diagnosis not present

## 2014-12-15 ENCOUNTER — Telehealth: Payer: Self-pay | Admitting: Family Medicine

## 2014-12-15 DIAGNOSIS — IMO0002 Reserved for concepts with insufficient information to code with codable children: Secondary | ICD-10-CM

## 2014-12-15 DIAGNOSIS — E1165 Type 2 diabetes mellitus with hyperglycemia: Secondary | ICD-10-CM

## 2014-12-15 NOTE — Telephone Encounter (Signed)
This patient is calling because she needs a referral placed for her to see Dr. Baird Cancer with [facility],  Address: [address], [place], [place] [address], [phone]. Sadie Reynolds, ASA

## 2014-12-15 NOTE — Telephone Encounter (Signed)
Will forward to MD. Jazmin Hartsell,CMA  

## 2014-12-20 NOTE — Telephone Encounter (Signed)
Referral placed.

## 2014-12-21 ENCOUNTER — Other Ambulatory Visit: Payer: Self-pay | Admitting: Bariatrics

## 2014-12-21 DIAGNOSIS — R194 Change in bowel habit: Secondary | ICD-10-CM

## 2014-12-21 DIAGNOSIS — R110 Nausea: Secondary | ICD-10-CM

## 2014-12-21 DIAGNOSIS — I10 Essential (primary) hypertension: Secondary | ICD-10-CM | POA: Diagnosis not present

## 2014-12-21 DIAGNOSIS — E782 Mixed hyperlipidemia: Secondary | ICD-10-CM | POA: Diagnosis not present

## 2014-12-21 DIAGNOSIS — Z9884 Bariatric surgery status: Secondary | ICD-10-CM | POA: Diagnosis not present

## 2014-12-23 ENCOUNTER — Other Ambulatory Visit: Payer: Self-pay | Admitting: Bariatrics

## 2014-12-23 DIAGNOSIS — H3531 Nonexudative age-related macular degeneration: Secondary | ICD-10-CM | POA: Diagnosis not present

## 2014-12-23 DIAGNOSIS — E11329 Type 2 diabetes mellitus with mild nonproliferative diabetic retinopathy without macular edema: Secondary | ICD-10-CM | POA: Diagnosis not present

## 2014-12-23 DIAGNOSIS — R110 Nausea: Secondary | ICD-10-CM

## 2014-12-23 DIAGNOSIS — R194 Change in bowel habit: Secondary | ICD-10-CM

## 2014-12-27 ENCOUNTER — Ambulatory Visit
Admission: RE | Admit: 2014-12-27 | Discharge: 2014-12-27 | Disposition: A | Discharge status: Home / Self Care | Payer: Commercial Managed Care - HMO | Source: Ambulatory Visit | Attending: Bariatrics | Admitting: Bariatrics

## 2014-12-27 DIAGNOSIS — R110 Nausea: Secondary | ICD-10-CM | POA: Diagnosis not present

## 2014-12-27 DIAGNOSIS — Z9889 Other specified postprocedural states: Secondary | ICD-10-CM | POA: Insufficient documentation

## 2014-12-27 DIAGNOSIS — R194 Change in bowel habit: Secondary | ICD-10-CM

## 2014-12-27 DIAGNOSIS — K6389 Other specified diseases of intestine: Secondary | ICD-10-CM | POA: Diagnosis not present

## 2015-01-05 ENCOUNTER — Encounter: Payer: Self-pay | Admitting: Family Medicine

## 2015-01-05 ENCOUNTER — Ambulatory Visit (INDEPENDENT_AMBULATORY_CARE_PROVIDER_SITE_OTHER): Payer: Commercial Managed Care - HMO | Admitting: Family Medicine

## 2015-01-05 VITALS — Ht 69.0 in | Wt 193.5 lb

## 2015-01-05 DIAGNOSIS — E119 Type 2 diabetes mellitus without complications: Secondary | ICD-10-CM

## 2015-01-05 NOTE — Patient Instructions (Addendum)
-   Pro-Stat Sugar-free protein supplement, mixed in water or Crystal Light.    (Be sure to consume some plain water each day as well.)  - Try to get most of your protein from real food, using Pro-Stat as supplement.  - You can order more at [employer] or at [employer]. - Eat at least 3 REAL meals and 1-2 snacks per day.  Aim for no more than 5 hours between eating.  Eat breakfast within one hour of getting up.   - A REAL meal = include at least some protein and (double) veg's.  - Limit nuts to 1/4 cup per day.   - Come up with a list of snacks you will enjoy, and email that list to [email or web address] for review.  Be specific, and list all ingredients and quantities.    Here is what you want to ask your insurance company:  - Does my policy cover MEDICAL NUTRITION THERAPY (CPT code [phone] - initial appt and [phone] - follow-up appts)? - If yes, does it cover this service for ANY diagnosis?  If not all diagnoses, what diagnoses are covered? - What is my co-pay? - Am I limited to a certain number of visits per year? - If no, is NUTRITION COUNSELING COVERED under Preventive Services (CPT code [id number])?  Please let me know what they say.    Please schedule your appt for Thur, Sept 1 at 1:30.  Ask for 60 min.

## 2015-01-05 NOTE — Progress Notes (Signed)
Bariatric Surgeon: Legrand Como A. Tyner, MD   Medical Nutrition Therapy:  Appt start time: 1430 end time:  9892.  Assessment:  Primary concerns today: Weight management.   [name] had bariatric (roux-en-Y) surg Nov 05, 2013 (through a [facility] surgical practice), and has lost >135 lb since then.  She has had IBS for many yrs, but since the surgery, she has had very loose, even liquid, stools 1-10 X day, with incontinence.  She has recently had a GI workup, but has not yet been to her surgeon for results.  [name] has been advised to eat 80-90 g per day, which has been difficult to achieve.  Other dietary recommendations she has been given:  Avoid corn, fatty meats, most carb's, carbonated drinks, certain veg's, sugar, and high-Na foods.    She has tried to go walking at times, but hasn't been able to exercise much since her surgery b/c of incontinence.  [name] became tearful today as she talked about living with this problem, which she has not been able to link to any particular food or trigger.  [name] has been checking BG daily; usually <110.  Last A1C was 6.5 (March 2016).      Learning Readiness: Change in progress  Barriers to learning/adherence to lifestyle change: diarrhea and incontenence currently prevents exercise.    Usual eating pattern includes 1-2 meals and 3-4 snacks per day. Frequent foods and beverages include oz 60-90 oz Crystal Light diet tea, water, 1-3 c coffee w/ s-f, f-f flavrored creamer, 2 pks artif swtnr, eggs, Cheerios w/ f-f milk.  Avoided foods include fatty meats, fried/fast fds, starchy and sweet foods, watermelon.   Usual physical activity includes occasional walking.  [name] takes care of her husband, who has MS, which is physically demanding.    24-hr recall: (Up at 10 AM) B (    )-  --- Snk (    )-  --- L (12:30 PM)-  2 slc deli [place], 2 slc cheese, 2 peeled cucumbersSnk ( PM)-   Snk (4 PM)-   4 saltines, 2 tsp peanut butter D ( PM)-  1/4 c potatoes, 2-3 oz venison,  1 c summer squash Snk ( PM)-  1/2 c salted cashews Typical day? Yes.  except that dinner does not usually include any starch, and usually has breakfast.      Progress Towards Goal(s):  In progress.   Nutritional Diagnosis:  Bradford-3.3 Overweight/obesity As related to history of severe obesity.  As evidenced by BMI still >28.    Intervention:  Nutrition education.  Handouts given during visit include:  AVS  Lists of protein, veg's, and starch foods.    Demonstrated degree of understanding via:  Teach Back   Monitoring/Evaluation:  Dietary intake, exercise, and body weight in 7 week(s).  No appt available before then.

## 2015-01-09 ENCOUNTER — Encounter: Payer: Self-pay | Admitting: Family Medicine

## 2015-01-25 DIAGNOSIS — K623 Rectal prolapse: Secondary | ICD-10-CM | POA: Diagnosis not present

## 2015-01-25 DIAGNOSIS — R195 Other fecal abnormalities: Secondary | ICD-10-CM | POA: Diagnosis not present

## 2015-02-01 ENCOUNTER — Telehealth: Payer: Self-pay | Admitting: Family Medicine

## 2015-02-01 DIAGNOSIS — R159 Full incontinence of feces: Secondary | ICD-10-CM

## 2015-02-01 DIAGNOSIS — Z9884 Bariatric surgery status: Secondary | ICD-10-CM

## 2015-02-01 DIAGNOSIS — R197 Diarrhea, unspecified: Secondary | ICD-10-CM

## 2015-02-01 NOTE — Telephone Encounter (Signed)
Will forward to MD. Harden Bramer,CMA  

## 2015-02-01 NOTE — Telephone Encounter (Signed)
Referral order placed.

## 2015-02-01 NOTE — Telephone Encounter (Signed)
Pt called because she needs a referral to go see Dr. Truddie Hidden who is a colon specialist. The office number is [phone]. Her appointment is for 02/14/15 at 12:45 pm. [name]

## 2015-02-02 ENCOUNTER — Telehealth: Payer: Self-pay | Admitting: Family Medicine

## 2015-02-02 NOTE — Telephone Encounter (Signed)
Per Martinique Holmes at [facility], patient has no OUT OF NETWORK benefits.

## 2015-02-02 NOTE — Telephone Encounter (Signed)
LMOVM. Please advise patient that Dr. Truddie Hidden, her colon specialist is not in Network with [facility] and I am unable to place a referral for her upcoming appointment with him. If she has any additional questions, she will need to contact [facility] and find out which colon specialist ARE in network. Thanks.

## 2015-02-15 DIAGNOSIS — Z9884 Bariatric surgery status: Secondary | ICD-10-CM | POA: Diagnosis not present

## 2015-02-15 DIAGNOSIS — E119 Type 2 diabetes mellitus without complications: Secondary | ICD-10-CM | POA: Diagnosis not present

## 2015-02-15 DIAGNOSIS — R197 Diarrhea, unspecified: Secondary | ICD-10-CM | POA: Diagnosis not present

## 2015-02-15 DIAGNOSIS — I10 Essential (primary) hypertension: Secondary | ICD-10-CM | POA: Diagnosis not present

## 2015-02-15 DIAGNOSIS — G4733 Obstructive sleep apnea (adult) (pediatric): Secondary | ICD-10-CM | POA: Diagnosis not present

## 2015-02-23 ENCOUNTER — Ambulatory Visit: Payer: Commercial Managed Care - HMO | Admitting: Family Medicine

## 2015-02-28 ENCOUNTER — Ambulatory Visit: Payer: Commercial Managed Care - HMO | Admitting: Family Medicine

## 2015-04-17 ENCOUNTER — Other Ambulatory Visit: Payer: Self-pay

## 2015-04-17 ENCOUNTER — Other Ambulatory Visit: Payer: Self-pay | Admitting: Family Medicine

## 2015-04-17 NOTE — Telephone Encounter (Signed)
Effexor-XR last refilled 12/15/13 for #270 with 3 refills. Ok to refill this medication?

## 2015-04-17 NOTE — Telephone Encounter (Signed)
No, this is not my patient- see PCP. Forwarding to [facility]. Thanks Janett Billow!

## 2015-04-18 MED ORDER — VENLAFAXINE HCL ER 75 MG PO CP24: ORAL_CAPSULE | ORAL | Status: DC

## 2015-04-18 NOTE — Telephone Encounter (Signed)
Will forward to MD.  Sent to previous pcp by epic error. Rodneshia Greenhouse,CMA

## 2015-04-18 NOTE — Addendum Note (Signed)
Addended by: Valerie Roys on: 04/18/2015 08:51 AM   Modules accepted: Orders

## 2015-05-11 DIAGNOSIS — R9431 Abnormal electrocardiogram [ECG] [EKG]: Secondary | ICD-10-CM | POA: Diagnosis not present

## 2015-05-11 DIAGNOSIS — E1169 Type 2 diabetes mellitus with other specified complication: Secondary | ICD-10-CM | POA: Diagnosis not present

## 2015-05-11 DIAGNOSIS — I10 Essential (primary) hypertension: Secondary | ICD-10-CM | POA: Diagnosis not present

## 2015-05-11 DIAGNOSIS — I739 Peripheral vascular disease, unspecified: Secondary | ICD-10-CM | POA: Diagnosis not present

## 2015-05-11 DIAGNOSIS — E782 Mixed hyperlipidemia: Secondary | ICD-10-CM | POA: Diagnosis not present

## 2015-05-11 DIAGNOSIS — I700 Atherosclerosis of aorta: Secondary | ICD-10-CM | POA: Diagnosis not present

## 2015-05-11 DIAGNOSIS — Z0000 Encounter for general adult medical examination without abnormal findings: Secondary | ICD-10-CM | POA: Diagnosis not present

## 2015-05-11 DIAGNOSIS — E1140 Type 2 diabetes mellitus with diabetic neuropathy, unspecified: Secondary | ICD-10-CM | POA: Diagnosis not present

## 2015-05-17 DIAGNOSIS — Z01411 Encounter for gynecological examination (general) (routine) with abnormal findings: Secondary | ICD-10-CM | POA: Diagnosis not present

## 2015-05-17 DIAGNOSIS — Z130 Encounter for screening for diseases of the blood and blood-forming organs and certain disorders involving the immune mechanism: Secondary | ICD-10-CM | POA: Diagnosis not present

## 2015-05-17 DIAGNOSIS — Z6830 Body mass index (BMI) 30.0-30.9, adult: Secondary | ICD-10-CM | POA: Diagnosis not present

## 2015-05-17 DIAGNOSIS — R151 Fecal smearing: Secondary | ICD-10-CM | POA: Diagnosis not present

## 2015-05-24 DIAGNOSIS — K623 Rectal prolapse: Secondary | ICD-10-CM | POA: Insufficient documentation

## 2015-05-24 DIAGNOSIS — F439 Reaction to severe stress, unspecified: Secondary | ICD-10-CM | POA: Insufficient documentation

## 2015-05-24 HISTORY — DX: Rectal prolapse: K62.3

## 2015-05-24 HISTORY — DX: Reaction to severe stress, unspecified: F43.9

## 2015-05-25 ENCOUNTER — Other Ambulatory Visit
Admission: RE | Admit: 2015-05-25 | Discharge: 2015-05-25 | Disposition: A | Discharge status: Home / Self Care | Payer: Commercial Managed Care - HMO | Source: Ambulatory Visit | Attending: Bariatrics | Admitting: Bariatrics

## 2015-05-25 DIAGNOSIS — Z9884 Bariatric surgery status: Secondary | ICD-10-CM | POA: Diagnosis not present

## 2015-05-25 LAB — CBC WITH DIFFERENTIAL/PLATELET
BASOS ABS: 0 10*3/uL (ref 0–0.1)
BASOS PCT: 1 %
Eosinophils Absolute: 0.2 10*3/uL (ref 0–0.7)
Eosinophils Relative: 4 %
HEMATOCRIT: 38.8 % (ref 35.0–47.0)
HEMOGLOBIN: 12.9 g/dL (ref 12.0–16.0)
LYMPHS PCT: 32 %
Lymphs Abs: 1.4 10*3/uL (ref 1.0–3.6)
MCH: 27.2 pg (ref 26.0–34.0)
MCHC: 33.2 g/dL (ref 32.0–36.0)
MCV: 82 fL (ref 80.0–100.0)
Monocytes Absolute: 0.4 10*3/uL (ref 0.2–0.9)
Monocytes Relative: 8 %
NEUTROS ABS: 2.5 10*3/uL (ref 1.4–6.5)
NEUTROS PCT: 55 %
Platelets: 174 10*3/uL (ref 150–440)
RBC: 4.72 MIL/uL (ref 3.80–5.20)
RDW: 14 % (ref 11.5–14.5)
WBC: 4.5 10*3/uL (ref 3.6–11.0)

## 2015-05-25 LAB — FERRITIN: FERRITIN: 47 ng/mL (ref 11–307)

## 2015-05-25 LAB — COMPREHENSIVE METABOLIC PANEL
ALBUMIN: 4 g/dL (ref 3.5–5.0)
ALK PHOS: 124 U/L (ref 38–126)
ALT: 20 U/L (ref 14–54)
ANION GAP: 7 (ref 5–15)
AST: 24 U/L (ref 15–41)
BILIRUBIN TOTAL: 0.5 mg/dL (ref 0.3–1.2)
BUN: 17 mg/dL (ref 6–20)
CALCIUM: 9.8 mg/dL (ref 8.9–10.3)
CO2: 29 mmol/L (ref 22–32)
Chloride: 109 mmol/L (ref 101–111)
Creatinine, Ser: 0.66 mg/dL (ref 0.44–1.00)
GLUCOSE: 103 mg/dL — AB (ref 65–99)
POTASSIUM: 3.3 mmol/L — AB (ref 3.5–5.1)
Sodium: 145 mmol/L (ref 135–145)
TOTAL PROTEIN: 6.9 g/dL (ref 6.5–8.1)

## 2015-05-25 LAB — FOLATE: Folate: 13.3 ng/mL (ref >5.9)

## 2015-05-25 LAB — MAGNESIUM: Magnesium: 2 mg/dL (ref 1.7–2.4)

## 2015-05-25 LAB — IRON: Iron: 39 ug/dL (ref 28–170)

## 2015-05-25 LAB — TSH: TSH: 1.705 u[IU]/mL (ref 0.350–4.500)

## 2015-05-26 LAB — HEMOGLOBIN A1C: HEMOGLOBIN A1C: 5.9 % (ref 4.0–6.0)

## 2015-05-26 LAB — PREALBUMIN: Prealbumin: 20.4 mg/dL (ref 18–38)

## 2015-05-26 LAB — VITAMIN B12: VITAMIN B 12: 210 pg/mL (ref 180–914)

## 2015-05-27 LAB — PTH, INTACT AND CALCIUM
Calcium, Total (PTH): 9.6 mg/dL (ref 8.7–10.3)
PTH: 104 pg/mL — ABNORMAL HIGH (ref 15–65)

## 2015-05-27 LAB — VITAMIN D 25 HYDROXY (VIT D DEFICIENCY, FRACTURES): Vit D, 25-Hydroxy: 18.3 ng/mL — ABNORMAL LOW (ref 30.0–100.0)

## 2015-05-27 LAB — VITAMIN E: Alpha-Tocopherol: 9.5 mg/L (ref 5.3–16.8)

## 2015-05-27 LAB — ZINC: Zinc: 63 ug/dL (ref 56–134)

## 2015-05-27 LAB — COPPER, SERUM: Copper: 127 ug/dL (ref 72–166)

## 2015-05-27 LAB — VITAMIN A: VITAMIN A (RETINOIC ACID): 49 ug/dL (ref 20–65)

## 2015-05-27 LAB — VITAMIN B1: Vitamin B1 (Thiamine): 168.5 nmol/L (ref 66.5–200.0)

## 2015-05-27 LAB — C-PEPTIDE: C-Peptide: 4.9 ng/mL — ABNORMAL HIGH (ref 1.1–4.4)

## 2015-05-27 LAB — INSULIN, RANDOM: Insulin: 27.9 u[IU]/mL — ABNORMAL HIGH (ref 2.6–24.9)

## 2015-05-28 LAB — MISC LABCORP TEST (SEND OUT): Labcorp test code: 121200

## 2015-07-24 ENCOUNTER — Other Ambulatory Visit: Payer: Self-pay | Admitting: Family Medicine

## 2015-07-24 DIAGNOSIS — I10 Essential (primary) hypertension: Secondary | ICD-10-CM

## 2015-07-24 DIAGNOSIS — E1140 Type 2 diabetes mellitus with diabetic neuropathy, unspecified: Secondary | ICD-10-CM

## 2015-07-24 DIAGNOSIS — E7800 Pure hypercholesterolemia, unspecified: Secondary | ICD-10-CM

## 2015-07-24 MED ORDER — AMLODIPINE BESYLATE 10 MG PO TABS: 10.0000 mg | ORAL_TABLET | Freq: Every day | ORAL | Status: DC

## 2015-07-24 MED ORDER — ATORVASTATIN CALCIUM 40 MG PO TABS: 40.0000 mg | ORAL_TABLET | Freq: Every day | ORAL | Status: DC

## 2015-07-24 MED ORDER — METFORMIN HCL 1000 MG PO TABS: ORAL_TABLET | ORAL | Status: DC

## 2015-07-24 MED ORDER — METOPROLOL SUCCINATE ER 25 MG PO TB24: 25.0000 mg | ORAL_TABLET | Freq: Every day | ORAL | Status: DC

## 2015-07-24 MED ORDER — QUINAPRIL HCL 40 MG PO TABS: 40.0000 mg | ORAL_TABLET | Freq: Every day | ORAL | Status: DC

## 2015-07-24 MED ORDER — VENLAFAXINE HCL ER 75 MG PO CP24: ORAL_CAPSULE | ORAL | Status: DC

## 2015-07-24 MED ORDER — GABAPENTIN 300 MG PO CAPS: 900.0000 mg | ORAL_CAPSULE | Freq: Three times a day (TID) | ORAL | Status: DC

## 2015-07-25 ENCOUNTER — Encounter: Payer: Self-pay | Admitting: Family Medicine

## 2015-07-25 ENCOUNTER — Ambulatory Visit (INDEPENDENT_AMBULATORY_CARE_PROVIDER_SITE_OTHER): Payer: Medicare Other | Admitting: Family Medicine

## 2015-07-25 VITALS — BP 171/98 | HR 111 | Temp 97.9°F | Ht 69.0 in | Wt 213.0 lb

## 2015-07-25 DIAGNOSIS — F32A Depression, unspecified: Secondary | ICD-10-CM

## 2015-07-25 DIAGNOSIS — F329 Major depressive disorder, single episode, unspecified: Secondary | ICD-10-CM

## 2015-07-25 DIAGNOSIS — Z23 Encounter for immunization: Secondary | ICD-10-CM

## 2015-07-25 DIAGNOSIS — I10 Essential (primary) hypertension: Secondary | ICD-10-CM | POA: Diagnosis not present

## 2015-07-25 DIAGNOSIS — E1140 Type 2 diabetes mellitus with diabetic neuropathy, unspecified: Secondary | ICD-10-CM

## 2015-07-25 MED ORDER — CLOBETASOL PROPIONATE 0.05 % EX CREA: 1.0000 "application " | TOPICAL_CREAM | Freq: Two times a day (BID) | CUTANEOUS | Status: DC

## 2015-07-25 MED ORDER — VITAMIN D3 5000 UNIT/ML PO LIQD
1.0000 mL | Freq: Every day | ORAL | Status: DC
Start: 2015-07-25 — End: 2015-10-25

## 2015-07-25 NOTE — Patient Instructions (Signed)
Nitrile gloves   Use the steroid cream as directed  Come back in about 6 weeks.    50,000 units of Vitamin D3, take this each week in divided doses. This would be about 7000 units per day. Do this for about 8 weeks and then we re-check your level with blood work.

## 2015-07-25 NOTE — Progress Notes (Signed)
   Subjective:    Patient ID: [name], female    DOB: [birth year], 60 y.o.   MRN: [id number]  HPI  CC: follow up   # Hypertension:  Having issues with taking her medicines  Uses a pill box, often will forget to take them. Also having issue with swallowing the pills ROS: no HA, no CP  # Diabetes  Going very well. Seems to be tolerating metformin  Has continued to also work on her diet with her husband (also diagnosed with DM) ROS: no polyuria/polydipsia  # Stress/anxiety  Has 2 primary stressors in her life: taking care of her husband, and her ongoing issue with uncontrollable diarrhea since having bariatric surgery  Diarrhea sounds to be a bigger cause of stress: she has difficulty going out in public because she cannot tell when she will have this, and really gets almost no warning. She is wearing depends. She is going to go to another specialist at [facility] sometime soon. She was also told by her current surgeon she may need a revision to give more of her intestine back.  Social Hx: former smoker  Review of Systems   See HPI for ROS.   Past medical history, surgical, family, and social history reviewed and updated in the EMR as appropriate. Objective:  BP 171/98 mmHg  Pulse 111  Temp(Src) 97.9 F (36.6 C) (Oral)  Ht 5' 9"  (1.753 m)  Wt 213 lb (96.616 kg)  BMI 31.44 kg/m2 Vitals and nursing note reviewed  General: no apparent distress CV: borderline tachycardic, regular rhythm, 2+ radial pulses bilaterally  Resp: clear to auscultation bilaterally, normal effort  Assessment & Plan:  Essential hypertension Not at goal today, did not take medicines. She seems to be having issues with adherence. Asked her to continue current dosing of amlodipine 10, quinapril 40, toprol xl 25 and schedule clinic BP visit/visit with me in about 6 weeks.   Type 2 diabetes, controlled, with neuropathy Stable and well controlled. A1c in December was 5.9. Continue metformin, if  stays stable below 6.5 may consider titrating off metformin. Follow up 3-6 months.  Chronic depression Has recent worsening in face of multiple stressors including taking care of husband and her own personal issue of chronic diarrhea. Given support in visit today. No medication changes. Follow up 6 weeks

## 2015-07-31 NOTE — Assessment & Plan Note (Signed)
Has recent worsening in face of multiple stressors including taking care of husband and her own personal issue of chronic diarrhea. Given support in visit today. No medication changes. Follow up 6 weeks

## 2015-07-31 NOTE — Assessment & Plan Note (Signed)
Stable and well controlled. A1c in December was 5.9. Continue metformin, if stays stable below 6.5 may consider titrating off metformin. Follow up 3-6 months.

## 2015-07-31 NOTE — Assessment & Plan Note (Signed)
Not at goal today, did not take medicines. She seems to be having issues with adherence. Asked her to continue current dosing of amlodipine 10, quinapril 40, toprol xl 25 and schedule clinic BP visit/visit with me in about 6 weeks.

## 2015-08-02 DIAGNOSIS — R159 Full incontinence of feces: Secondary | ICD-10-CM

## 2015-08-02 HISTORY — DX: Full incontinence of feces: R15.9

## 2015-08-08 DIAGNOSIS — R159 Full incontinence of feces: Secondary | ICD-10-CM | POA: Diagnosis not present

## 2015-08-11 ENCOUNTER — Telehealth: Payer: Self-pay | Admitting: Family Medicine

## 2015-08-11 MED ORDER — BETAMETHASONE DIPROPIONATE AUG 0.05 % EX CREA: TOPICAL_CREAM | Freq: Two times a day (BID) | CUTANEOUS | Status: DC

## 2015-08-11 NOTE — Telephone Encounter (Signed)
Spoke with Sherrie Sport with [employer] and he stated that Betamethasone is only $39.37 for 3 months but it is also a tier 4.  Will forward to MD. Johnney Ou

## 2015-08-11 NOTE — Telephone Encounter (Signed)
Pt called because she came for a rash and was prescribed some cream. The issue is that the cream is a tier 4 and her insurance only covers tier 1-3. Can he call in something in one of those tiers. Jw

## 2015-08-14 NOTE — Telephone Encounter (Signed)
Sent in [place] on 2/17.

## 2015-08-25 DIAGNOSIS — R159 Full incontinence of feces: Secondary | ICD-10-CM | POA: Diagnosis not present

## 2015-09-07 DIAGNOSIS — H2511 Age-related nuclear cataract, right eye: Secondary | ICD-10-CM | POA: Diagnosis not present

## 2015-09-07 DIAGNOSIS — H25011 Cortical age-related cataract, right eye: Secondary | ICD-10-CM | POA: Diagnosis not present

## 2015-09-07 DIAGNOSIS — E113391 Type 2 diabetes mellitus with moderate nonproliferative diabetic retinopathy without macular edema, right eye: Secondary | ICD-10-CM | POA: Diagnosis not present

## 2015-09-07 DIAGNOSIS — Z961 Presence of intraocular lens: Secondary | ICD-10-CM | POA: Diagnosis not present

## 2015-09-07 DIAGNOSIS — E113392 Type 2 diabetes mellitus with moderate nonproliferative diabetic retinopathy without macular edema, left eye: Secondary | ICD-10-CM | POA: Diagnosis not present

## 2015-09-25 ENCOUNTER — Other Ambulatory Visit: Payer: Self-pay | Admitting: *Deleted

## 2015-09-27 MED ORDER — GLUCOSE BLOOD VI STRP: 1.0000 | ORAL_STRIP | Freq: Four times a day (QID) | Status: DC

## 2015-10-19 ENCOUNTER — Ambulatory Visit (INDEPENDENT_AMBULATORY_CARE_PROVIDER_SITE_OTHER): Payer: Medicare Other | Admitting: Family Medicine

## 2015-10-19 VITALS — BP 160/90 | HR 62 | Ht 69.0 in

## 2015-10-19 DIAGNOSIS — M545 Low back pain: Secondary | ICD-10-CM

## 2015-10-19 DIAGNOSIS — E1140 Type 2 diabetes mellitus with diabetic neuropathy, unspecified: Secondary | ICD-10-CM | POA: Diagnosis not present

## 2015-10-19 DIAGNOSIS — M7502 Adhesive capsulitis of left shoulder: Secondary | ICD-10-CM

## 2015-10-19 LAB — POCT GLYCOSYLATED HEMOGLOBIN (HGB A1C): Hemoglobin A1C: 6.3

## 2015-10-19 NOTE — Patient Instructions (Signed)
Abduction exercise:

## 2015-10-19 NOTE — Progress Notes (Signed)
   Subjective:    Patient ID: [name], female    DOB: 1955-07-19, 60 y.o.   MRN: [id number]  HPI  CC: left shoulder pain  # Left shoulder pain:  Present for past 2-3 months  Doesn't remember any specific injury  Feels like it has been getting worse  Noticed for past few weeks has had difficulty with raising arm above shoulder  Ibuprofen does not really help.  ROS: no weakness, no numbness  # Low back pain  Present for years  Has had back brace in the past, however she has lost significant weight from bypass and this no longer fits  She gets pain from lifting her husband (paraplegic) and wants to get another brace for support  # Hypertension  States that she did not take her BP medicines today  Denies any lightheadedness or   Social Hx: former smoker, quit 1996 48 pack year history   Review of Systems   See HPI for ROS.   Past medical history, surgical, family, and social history reviewed and updated in the EMR as appropriate. Objective:  BP 160/90 mmHg  Pulse 62  Ht 5' 9"  (1.753 m)  Wt   SpO2 97% Vitals and nursing note reviewed  General: no apparent distress  Back: tender to palpation midline lumbar spine, no deformity noted MSK: left shoulder unable to abduct actively or passively past 90 degrees. Internal rotation/adduction limited (back scratch) on the left compared to the right. There is pain to palpation of AC, anterior, lateral aspects of the shoulder but no specific point tenderness.   Assessment & Plan:  Adhesive capsulitis of left shoulder Exam seems most consistent with frozen shoulder. She does have a history of DM (though has been well controlled for the past year), no specific injury that she recalls, has had surgery on right shoulder for rotator cuff repair. Discussed trial of steroid injection in office today vs referral to sports medicine for possible ultrasound guided injection, she decided to go with sports medicine. Also discussed  physical therapy referral. Follow up as needed .

## 2015-10-20 DIAGNOSIS — M7502 Adhesive capsulitis of left shoulder: Secondary | ICD-10-CM | POA: Insufficient documentation

## 2015-10-20 NOTE — Assessment & Plan Note (Signed)
Persistent. Rx for ne wback brace that will fit since she seemed to get good relief with the use of this while trying to move her husband. Follow up as needed.

## 2015-10-20 NOTE — Assessment & Plan Note (Signed)
Exam seems most consistent with frozen shoulder. She does have a history of DM (though has been well controlled for the past year), no specific injury that she recalls, has had surgery on right shoulder for rotator cuff repair. Discussed trial of steroid injection in office today vs referral to sports medicine for possible ultrasound guided injection, she decided to go with sports medicine. Also discussed physical therapy referral. Follow up as needed .

## 2015-10-25 ENCOUNTER — Encounter: Payer: Self-pay | Admitting: Gastroenterology

## 2015-10-25 ENCOUNTER — Ambulatory Visit (INDEPENDENT_AMBULATORY_CARE_PROVIDER_SITE_OTHER): Payer: Medicare Other | Admitting: Gastroenterology

## 2015-10-25 VITALS — BP 150/80 | HR 68 | Ht 69.0 in | Wt 218.0 lb

## 2015-10-25 DIAGNOSIS — Z800 Family history of malignant neoplasm of digestive organs: Secondary | ICD-10-CM

## 2015-10-25 DIAGNOSIS — R197 Diarrhea, unspecified: Secondary | ICD-10-CM

## 2015-10-25 DIAGNOSIS — R151 Fecal smearing: Secondary | ICD-10-CM

## 2015-10-25 DIAGNOSIS — Z86010 Personal history of colonic polyps: Secondary | ICD-10-CM

## 2015-10-25 NOTE — Patient Instructions (Addendum)
You have been scheduled for a colonoscopy. Please follow written instructions given to you at your visit today.  Please pick up your prep supplies at the pharmacy within the next 1-3 days. If you use inhalers (even only as needed), please bring them with you on the day of your procedure. Your physician has requested that you go to www.startemmi.com and enter the access code given to you at your visit today. This web site gives a general overview about your procedure. However, you should still follow specific instructions given to you by our office regarding your preparation for the procedure.  Today your blood pressure was elevated. Please follow up with your Primary Care Provider for blood pressure management.  If you are age 29 or older, your body mass index should be between 23-30. Your Body mass index is 32.18 kg/(m^2). If this is out of the aforementioned range listed, please consider follow up with your Primary Care Provider.  If you are age 71 or younger, your body mass index should be between 19-25. Your Body mass index is 32.18 kg/(m^2). If this is out of the aformentioned range listed, please consider follow up with your Primary Care Provider.   Thank you for choosing [facility]  Dr Wilfrid Lund III

## 2015-10-25 NOTE — Progress Notes (Signed)
[facility] Progress Note  Chief Complaint: Diarrhea and family history of colon cancer.  Subjective History:  This is the first time [name] is seeing me for her chronic diarrhea. She last saw Dr. Deatra Ina in December 2015, which appears to been her only office visit with him. 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 Her operative report is not available, she believes she has "short bowel syndrome". Dr. Deatra Ina performed a sigmoidoscopy in December 2015, random biopsies were were negative, and she had a hyperplastic polyp. There was some concern that she might have a colovaginal fistula because she reported seeing some brown liquid and sometimes air past per the vagina. However, I reviewed operative reports and the gynecologist were no fistula was found. Her last full colonoscopy was in August 2012, when 3 tubular adenomas were found.  She continues to have a severe diarrhea daily, with multiple episodes of incontinence, and this often keeps her from leaving the house. It is probably made worse by significant stress do to her chronically ill husband who has multiple sclerosis. Someone has prescribed her Creon 36, 2 tablets twice a day, and has not been helpful. She has also been on a GLP 2 agonist by injection for the last 4 weeks. This seems to been prescribed by primary care, and thus far it has not been helpful. She has nocturnal diarrhea, denies rectal bleeding or weight loss. ROS: Cardiovascular:  no chest pain Respiratory: no dyspnea  The patient's Past Medical, Family and Social History were reviewed and are on file in the EMR. Reports father and 2 sisters had CRC, at least 2 of which were under age 82 Objective:  Med list reviewed  Vital signs in last 24 hrs: Filed Vitals:   10/25/15 1133  BP: 142/78  Pulse: 68    Physical Exam   HEENT: sclera anicteric, oral mucosa moist without lesions  Neck: supple, no thyromegaly, JVD or  lymphadenopathy  Cardiac: RRR without murmurs, S1S2 heard, no peripheral edema  Pulm: clear to auscultation bilaterally, normal RR and effort noted  Abdomen: soft, No tenderness, with active bowel sounds. No guarding or palpable hepatosplenomegaly.  Skin; warm and dry, no jaundice or rash  Recent Labs:    Radiologic studies:  SBFT 12/2014 - shows quick passage from efferent limb to colon 01/2015 Defecogram - severe rectal prolapse  @ASSESSMENTPLANBEGIN @ Assessment: Encounter Diagnoses  Name Primary?  . Diarrhea, unspecified type Yes  . Fecal soiling   . Family history of colon cancer   . Personal history of colonic polyps     She appears to have diarrhea due to altered anatomy after gastric bypass causing some degree of malabsorption, though not leading to weight loss, as well as self-reported IBS. Bacterial overgrowth should be considered. Without knowing the details of her surgery, it is not clear to me that a GLP P2 agonist is exactly indicated in this scenario. Moreover, it has not improved her diarrhea. She reports failure of Lomotil and antispasmodic medicines in the past. Her reported family history of colon cancer is quite bothersome for the possibility of HNPCC.  Plan:  Colonoscopy due to family history of colon cancer and personal history of colon polyps. Additional right colon biopsies will also be taken due to the diarrhea, and order to rule out microscopic colitis. After that, it is probably worth a trial of Flagyl for possible bacterial overgrowth, as rifaximin may not be helpful given her altered surgical anatomy. I will have to  research Viberzi to see if he can be used in a gastric bypass patient. It would be worth trying what she can, since she reports that she will be returning to her bariatric surgeon to consider further surgery "to give me more small bowel".  35 minutes total time, over half spent reviewing records counseling and coordinating care.   [name]

## 2015-10-27 ENCOUNTER — Ambulatory Visit: Payer: Medicare Other | Admitting: Family Medicine

## 2015-10-30 ENCOUNTER — Ambulatory Visit (AMBULATORY_SURGERY_CENTER): Payer: Medicare Other | Admitting: Gastroenterology

## 2015-10-30 ENCOUNTER — Encounter: Payer: Self-pay | Admitting: Gastroenterology

## 2015-10-30 VITALS — BP 141/72 | HR 46 | Temp 98.6°F | Resp 13 | Ht 69.0 in | Wt 218.0 lb

## 2015-10-30 DIAGNOSIS — Z86010 Personal history of colonic polyps: Secondary | ICD-10-CM

## 2015-10-30 DIAGNOSIS — D122 Benign neoplasm of ascending colon: Secondary | ICD-10-CM

## 2015-10-30 DIAGNOSIS — D124 Benign neoplasm of descending colon: Secondary | ICD-10-CM

## 2015-10-30 DIAGNOSIS — R197 Diarrhea, unspecified: Secondary | ICD-10-CM

## 2015-10-30 DIAGNOSIS — Z800 Family history of malignant neoplasm of digestive organs: Secondary | ICD-10-CM | POA: Diagnosis not present

## 2015-10-30 DIAGNOSIS — G4733 Obstructive sleep apnea (adult) (pediatric): Secondary | ICD-10-CM | POA: Diagnosis not present

## 2015-10-30 MED ORDER — SODIUM CHLORIDE 0.9 % IV SOLN: 500.0000 mL | INTRAVENOUS | Status: DC

## 2015-10-30 MED ORDER — METRONIDAZOLE 500 MG PO TABS: 500.0000 mg | ORAL_TABLET | Freq: Three times a day (TID) | ORAL | Status: DC

## 2015-10-30 NOTE — Progress Notes (Signed)
To pacu vss patent aw

## 2015-10-30 NOTE — Op Note (Signed)
[facility] Patient Name: [name] Procedure Date: 10/30/2015 1:14 PM MRN: [id number] Endoscopist: Mallie Mussel L. Loletha Carrow , MD Age: 60 Date of Birth: 03/26/1956 Gender: Female Procedure:                Colonoscopy Indications:              Screening patient at increased risk: Family history                            of colorectal cancer in multiple 1st-degree                            relatives, Surveillance: Personal history of                            adenomatous polyps on last colonoscopy > 3 years                            ago, Incidental diarrhea noted Medicines:                Monitored Anesthesia Care Procedure:                Pre-Anesthesia Assessment:                           - Prior to the procedure, a History and Physical                            was performed, and patient medications and                            allergies were reviewed. The patient's tolerance of                            previous anesthesia was also reviewed. The risks                            and benefits of the procedure and the sedation                            options and risks were discussed with the patient.                            All questions were answered, and informed consent                            was obtained. Prior Anticoagulants: The patient has                            taken aspirin, last dose was 1 day prior to                            procedure. ASA Grade Assessment: II - A patient  with mild systemic disease. After reviewing the                            risks and benefits, the patient was deemed in                            satisfactory condition to undergo the procedure.                           After obtaining informed consent, the colonoscope                            was passed under direct vision. Throughout the                            procedure, the patient's blood pressure, pulse, and   oxygen saturations were monitored continuously. The                            Model CF-HQ190L [id number]) scope was introduced                            through the anus and advanced to the the cecum,                            identified by appendiceal orifice and ileocecal                            valve. The colonoscopy was performed without                            difficulty. The patient tolerated the procedure                            well. The quality of the bowel preparation was                            good. The ileocecal valve, appendiceal orifice, and                            rectum were photographed. The bowel preparation                            used was Miralax. Scope In: 1:18:29 PM Scope Out: 1:49:58 PM Scope Withdrawal Time: 0 hours 25 minutes 53 seconds  Total Procedure Duration: 0 hours 31 minutes 29 seconds  Findings:                 The perianal and digital rectal examinations were                            normal.                           A 12 mm polyp was found in the proximal ascending  colon. The polyp was semi-pedunculated. The polyp                            was removed with a hot snare. Resection and                            retrieval were complete.                           Two sessile polyps were found in the mid descending                            colon and proximal ascending colon. The polyps were                            4 to 6 mm in size. These polyps were removed with a                            cold snare. Resection and retrieval were complete.                            The descending colon polypectomy site oozed                            slightly, requiring treatment with cautery.                           Normal mucosa was found in the entire colon.                            Biopsies for histology were taken with a cold                            forceps from the right colon and left colon for                             evaluation of microscopic colitis.                           The exam was otherwise without abnormality on                            direct and retroflexion views. Complications:            No immediate complications. Estimated Blood Loss:     Estimated blood loss was minimal. Impression:               - One 12 mm polyp in the proximal ascending colon,                            removed with a hot snare. Resected and retrieved.                           - Two 4 to 6 mm  polyps in the mid descending colon                            and in the proximal ascending colon, removed with a                            cold snare. Resected and retrieved. The descending                            colon polypectomy site oozed slightly, requiring                            treatment with cautery.                           - Normal mucosa in the entire examined colon.                            Biopsied.                           - The examination was otherwise normal on direct                            and retroflexion views. Recommendation:           - Patient has a contact number available for                            emergencies. The signs and symptoms of potential                            delayed complications were discussed with the                            patient. Return to normal activities tomorrow.                            Written discharge instructions were provided to the                            patient.                           - Resume previous diet.                           - No aspirin, ibuprofen, naproxen, or other                            non-steroidal anti-inflammatory drugs for 7 days                            after polyp removal.                           - Await  pathology results.                           - Repeat colonoscopy is recommended for                            surveillance. The colonoscopy date will be                             determined after pathology results from today's                            exam become available for review.                           - Metronidazole 500 mg three times daily for 7 days                            (empiric treatment for possible small bowel                            bacterial overgrowth). Prescribe #21 tablets , no                            refills Dariush Mcnellis L. Loletha Carrow, MD 10/30/2015 2:01:34 PM This report has been signed electronically.

## 2015-10-30 NOTE — Progress Notes (Signed)
Called to room to assist during endoscopic procedure.  Patient ID and intended procedure confirmed with present staff. Received instructions for my participation in the procedure from the performing physician.  

## 2015-10-30 NOTE — Patient Instructions (Addendum)
No aspirin, ibuprofen, naproxen or other non-steroidal anti-inflammatory drugs for 1 week.   Await pathology results for final recommendations. Prescription for Metronidazole sent to [facility]:   Refer to the procedure report that was given to you for any specific questions about what was found during the examination.  If the procedure report does not answer your questions, please call your gastroenterologist to clarify.  If you requested that your care partner not be given the details of your procedure findings, then the procedure report has been included in a sealed envelope for you to review at your convenience later.  YOU SHOULD EXPECT: Some feelings of bloating in the abdomen. Passage of more gas than usual.  Walking can help get rid of the air that was put into your GI tract during the procedure and reduce the bloating. If you had a lower endoscopy (such as a colonoscopy or flexible sigmoidoscopy) you may notice spotting of blood in your stool or on the toilet paper. If you underwent a bowel prep for your procedure, you may not have a normal bowel movement for a few days.  Please Note:  You might notice some irritation and congestion in your nose or some drainage.  This is from the oxygen used during your procedure.  There is no need for concern and it should clear up in a day or so.  SYMPTOMS TO REPORT IMMEDIATELY:   Following lower endoscopy (colonoscopy or flexible sigmoidoscopy):  Excessive amounts of blood in the stool  Significant tenderness or worsening of abdominal pains  Swelling of the abdomen that is new, acute  Fever of 100F or higher   For urgent or emergent issues, a gastroenterologist can be reached at any hour by calling [phone].   DIET: Your first meal following the procedure should be a small meal and then it is ok to progress to your normal diet. Heavy or fried foods are harder to digest and  may make you feel nauseous or bloated.  Likewise, meals heavy in dairy and vegetables can increase bloating.  Drink plenty of fluids but you should avoid alcoholic beverages for 24 hours.  ACTIVITY:  You should plan to take it easy for the rest of today and you should NOT DRIVE or use heavy machinery until tomorrow (because of the sedation medicines used during the test).    FOLLOW UP: Our staff will call the number listed on your records the next business day following your procedure to check on you and address any questions or concerns that you may have regarding the information given to you following your procedure. If we do not reach you, we will leave a message.  However, if you are feeling well and you are not experiencing any problems, there is no need to return our call.  We will assume that you have returned to your regular daily activities without incident.  If any biopsies were taken you will be contacted by phone or by letter within the next 1-3 weeks.  Please call us at [phone] if you have not heard about the biopsies in 3 weeks.    SIGNATURES/CONFIDENTIALITY: You and/or your care partner have signed paperwork which will be entered into your electronic medical record.  These signatures attest to the fact that that the information above on your After Visit Summary has been reviewed and is understood.  Full responsibility of the confidentiality of this discharge information lies with you and/or  your care-partner.

## 2015-10-31 ENCOUNTER — Telehealth: Payer: Self-pay | Admitting: *Deleted

## 2015-10-31 NOTE — Telephone Encounter (Signed)
Left message on f/u call back

## 2015-11-01 ENCOUNTER — Telehealth: Payer: Self-pay | Admitting: Family Medicine

## 2015-11-01 NOTE — Telephone Encounter (Signed)
Patient states she found one, no longer needs this.

## 2015-11-01 NOTE — Telephone Encounter (Signed)
Need copy of medication list to be picked up before 1:00 pm

## 2015-11-03 ENCOUNTER — Encounter: Payer: Self-pay | Admitting: Gastroenterology

## 2015-11-09 ENCOUNTER — Ambulatory Visit: Payer: Medicare Other | Admitting: Family Medicine

## 2015-11-17 ENCOUNTER — Ambulatory Visit (INDEPENDENT_AMBULATORY_CARE_PROVIDER_SITE_OTHER): Payer: Medicare Other | Admitting: Family Medicine

## 2015-11-17 ENCOUNTER — Encounter: Payer: Self-pay | Admitting: Family Medicine

## 2015-11-17 VITALS — BP 187/57 | HR 60 | Temp 98.5°F | Ht 68.0 in

## 2015-11-17 DIAGNOSIS — R5383 Other fatigue: Secondary | ICD-10-CM

## 2015-11-17 DIAGNOSIS — E1140 Type 2 diabetes mellitus with diabetic neuropathy, unspecified: Secondary | ICD-10-CM

## 2015-11-17 DIAGNOSIS — E559 Vitamin D deficiency, unspecified: Secondary | ICD-10-CM

## 2015-11-17 DIAGNOSIS — I10 Essential (primary) hypertension: Secondary | ICD-10-CM | POA: Diagnosis not present

## 2015-11-17 DIAGNOSIS — M5489 Other dorsalgia: Secondary | ICD-10-CM

## 2015-11-17 LAB — TSH: TSH: 3.26 mIU/L

## 2015-11-17 MED ORDER — PNEUMOCOCCAL VAC POLYVALENT 25 MCG/0.5ML IJ INJ
0.5000 mL | INJECTION | INTRAMUSCULAR | Status: AC
  Administered 2015-11-17: 0.5 mL via INTRAMUSCULAR

## 2015-11-17 NOTE — Patient Instructions (Addendum)
Goal blood pressure is less than 150/90.  Take your blood pressure medicines every single day, check your BP at home with your cuff and if it is consistently above that number you need to return to the clinic and we need to discuss changing/adding new medicines.    Due for your pap smear

## 2015-11-17 NOTE — Progress Notes (Signed)
   Subjective:    Patient ID: [name], female    DOB: [birth year], 60 y.o.   MRN: [id number]  HPI  CC: question about testing  # Question about tests:  Visited by nurse from [facility], told she should get "genetic testing" done along with TSH and vitamin D  She is not sure what the genetic testing is  # Hypertension  Did not take BP medications today ROS: no CP, no SOB, no HA  # Back pain  Has continued back pain which she says is worsened trying to take care of her husband, they were unable to get an eletric hoyer so she continues to use the manual which is very painful for her  Pain is primarily midline upper and lower back  Also says she was always 5'10" but has recently been measuring 5'8"  # Vitamin D deficiency  Never had level re-checked  Not taking any supplementation right now  Does complain of worsening fatigue over past several months  Social Hx: never smoker  Review of Systems   See HPI for ROS.   Past medical history, surgical, family, and social history reviewed and updated in the EMR as appropriate. Objective:  BP 187/57 mmHg  Pulse 60  Temp(Src) 98.5 F (36.9 C) (Oral)  SpO2 97% Vitals and nursing note reviewed  General: no apparent distress  CV: normal rate, regular rhythm Resp: clear to auscultation bilaterally, normal effort Back: mildly tender midline spine thoracic and lumbar, no deformities noted Extremities:: DM foot exam done, decreased sensation to monofilament testing both great toes  Assessment & Plan:  Essential hypertension Not at goal, however also did not take medicines today. Discussed importance of taking medicines every day, to check BP at home and call if >150/90.  Midline back pain Discussed x-rays as a possibility of compression fractures vs kyphosis; she does have a history of vitamin D deficiency but no other risk factors that I see that would indicate early DEXA scanning. Ordered x-ray lumbar/thorax that  she can get at her convience if she wishes.  Vitamin D deficiency Re-check since this was not done after therapy.

## 2015-11-18 LAB — VITAMIN D 25 HYDROXY (VIT D DEFICIENCY, FRACTURES): Vit D, 25-Hydroxy: 17 ng/mL — ABNORMAL LOW (ref 30–100)

## 2015-11-19 NOTE — Assessment & Plan Note (Signed)
Not at goal, however also did not take medicines today. Discussed importance of taking medicines every day, to check BP at home and call if >150/90.

## 2015-11-30 ENCOUNTER — Telehealth (HOSPITAL_BASED_OUTPATIENT_CLINIC_OR_DEPARTMENT_OTHER): Payer: Self-pay | Admitting: Family Medicine

## 2015-11-30 MED ORDER — VITAMIN D3 1.25 MG (50000 UT) PO TABS: 1.0000 | ORAL_TABLET | ORAL | Status: DC

## 2015-11-30 NOTE — ED Notes (Signed)
Called and discussed still other wise Vitamin D results, will send in additional supplementation. She has gastric bypass, and I was unable to find if any dose form needs to be changed, so will send in usual 50k units x 8 weeks.   Leone Brand, MD 11/30/15 [phone]

## 2015-12-27 ENCOUNTER — Ambulatory Visit (INDEPENDENT_AMBULATORY_CARE_PROVIDER_SITE_OTHER): Payer: PRIVATE HEALTH INSURANCE | Admitting: Diagnostic Neuroimaging

## 2015-12-27 ENCOUNTER — Encounter: Payer: Self-pay | Admitting: *Deleted

## 2015-12-27 VITALS — BP 161/73 | HR 50 | Ht 68.0 in | Wt 220.8 lb

## 2015-12-27 DIAGNOSIS — E1142 Type 2 diabetes mellitus with diabetic polyneuropathy: Secondary | ICD-10-CM | POA: Diagnosis not present

## 2015-12-27 DIAGNOSIS — R269 Unspecified abnormalities of gait and mobility: Secondary | ICD-10-CM

## 2015-12-27 NOTE — Progress Notes (Signed)
[facility]  PATIENT: [name] DOB: [birth year]  REFERRING CLINICIAN: [facility] HISTORY FROM: patient  REASON FOR VISIT: new consult    HISTORICAL  CHIEF COMPLAINT:  Chief Complaint  Patient presents with  . Evaluate for peripheral neuropathy    rm 7, New Pt, "have had neuropathy for a long time; today I have mild pain in both feet and hands"    HISTORY OF PRESENT ILLNESS:   60 year old right-handed female here for evaluation of neuropathy. Patient has history of diabetes, hypertension, hypercholesterolemia. May 2015 patient had gastric bypass surgery. Patient has history of Hemoccult A1c in the 12-13 range, which has significantly improved after gastric bypass surgery and now is in the 6.3 range.  Patient has history of numbness and tingling in the bottom of her feet for at least 10 years. She also some numbness in the main or hands for past 7 years. Patient was diagnosed with diabetic peripheral neuropathy. Patient has pain with standing, walking, having more problems with balance and walking. Patient has fallen at least 1-2 times per year. Patient able to walk for 20 minutes at a time.  Patient has had electrical testing in the past consistent with neuropathy.    REVIEW OF SYSTEMS: Full 14 system review of systems performed and negative with exception of: Fatigue diarrhea rash.  ALLERGIES: Allergies  Allergen Reactions  . Keflex [Cephalexin] Hives    Denies Airway involvement  . Adhesive [Tape] Rash    Latex  . Latex Rash  . Neosporin [Neomycin-Bacitracin Zn-Polymyx] Rash    HOME MEDICATIONS: Outpatient Prescriptions Prior to Visit  Medication Sig Dispense Refill  . amLODipine (NORVASC) 10 MG tablet Take 1 tablet (10 mg total) by mouth daily. 90 tablet 3  . aspirin 81 MG tablet Take 81 mg by mouth daily.    Marland Kitchen atorvastatin (LIPITOR) 40 MG tablet Take 1 tablet (40 mg total) by mouth daily. 90 tablet 3  . Blood Glucose  Monitoring Suppl (ACCU-CHEK AVIVA PLUS) W/DEVICE KIT Please provide 1 device 1 kit 0  . Cholecalciferol (VITAMIN D3) 10000 units TABS Take 1 capsule by mouth daily.    . Cholecalciferol (VITAMIN D3) 50000 units TABS Take 1 tablet by mouth once a week. For 8 weeks 8 tablet 0  . gabapentin (NEURONTIN) 300 MG capsule Take 3 capsules (900 mg total) by mouth 3 (three) times daily. 810 capsule 3  . glucose blood (ACCU-CHEK SMARTVIEW) test strip 1 each by Other route 4 (four) times daily. Use as instructed to check sugar 100 each 12  . ibuprofen (ADVIL,MOTRIN) 400 MG tablet Take 1 tablet (400 mg total) by mouth every 8 (eight) hours as needed. 30 tablet 0  . metoprolol succinate (TOPROL-XL) 25 MG 24 hr tablet Take 1 tablet (25 mg total) by mouth daily. 90 tablet 3  . metroNIDAZOLE (FLAGYL) 500 MG tablet Take 1 tablet (500 mg total) by mouth 3 (three) times daily. 21 tablet 0  . quinapril (ACCUPRIL) 40 MG tablet Take 1 tablet (40 mg total) by mouth daily. 90 tablet 3  . Teduglutide, rDNA, (GATTEX [place]) Inject into the skin.    Marland Kitchen venlafaxine XR (EFFEXOR-XR) 75 MG 24 hr capsule TAKE 3 CAPSULES EVERY DAY 270 capsule 3   No facility-administered medications prior to visit.    PAST MEDICAL HISTORY: Past Medical History  Diagnosis Date  . GOITER, MULTINODULAR 05/13/2007  . DIABETES MELLITUS, WITH NEUROLOGICAL COMPLICATIONS 03/09/3845  . HYPERCHOLESTEROLEMIA 03/20/2010  . DYSLIPIDEMIA 05/13/2007  . DEPRESSION,  CHRONIC 10/06/2007  . OBSTRUCTIVE SLEEP APNEA 06/12/2007    uses CPAP  . PERIPHERAL NEUROPATHY 01/12/2009  . HYPERTENSION 03/20/2010  . UNSPECIFIED VENOUS INSUFFICIENCY 05/28/2010  . PSORIASIS 05/28/2010  . BACK PAIN, LUMBAR 11/19/2007  . ANEMIA, PERNICIOUS, HX OF 05/13/2007  . ASYMPTOMATIC POSTMENOPAUSAL STATUS 02/18/2008  . Hx of colonic polyps 12/04/2010  . Cellulitis of leg, right 10/11/2010  . NASH (nonalcoholic steatohepatitis)   . Cellulitis of left lower leg 10/28/2013  . Fecal soiling 06/28/2014    . Allergy   . Anxiety   . Arthritis   . Cataract   . Sleep apnea     PAST SURGICAL HISTORY: Past Surgical History  Procedure Laterality Date  . Knee arthroscopy  2003    right  . Varicose vein surgery      Remotef  . Tonsillectomy    . Exercise myoview  01/24/2005  . Electrocardiogram  04/16/2006  . Rotator cuff repair  2008    right  . Gastric bypass  11/09/2013  . Examination under anesthesia N/A 06/29/2014    Procedure: EXAM UNDER ANESTHESIA;  Surgeon: Janyth Contes, MD;  Location: [facility];  Service: Gynecology;  Laterality: N/A;  . Gastric bypass    . Flexible sigmoidoscopy    . Colonoscopy      FAMILY HISTORY: Family History  Problem Relation Age of Onset  . Hyperlipidemia Father   . Hypertension Father   . Cirrhosis Father   . Colon cancer Father 59  . Colon cancer Sister 75  . Aneurysm Mother   . Cerebral aneurysm Mother   . Drug abuse Neg Hx   . CAD Neg Hx   . Stomach cancer Neg Hx   . Colon cancer Sister     SOCIAL HISTORY:  Social History   Social History  . Marital Status: Married    Spouse Name: Marya Amsler  . Number of Children: 2  . Years of Education: [employer]   Occupational History  . Unemployed     disabled   Social History Main Topics  . Smoking status: Former Smoker -- 2.00 packs/day for 24 years    Types: Cigarettes    Start date: 06/24/1970    Quit date: 08/06/1994  . Smokeless tobacco: Never Used  . Alcohol Use: No  . Drug Use: No  . Sexual Activity: No   Other Topics Concern  . Not on file   Social History Narrative   Married '91, husband has MS, wheelchair bound   [id number] x 2, 1 son '91 8.5lbs, 1 daughter '92 10lbs      Caffeine use- coffee 3-4 cups daily        PHYSICAL EXAM  GENERAL EXAM/CONSTITUTIONAL: Vitals:  Filed Vitals:   12/27/15 1058  BP: 161/73  Pulse: 50  Height: 5' 8"  (1.727 m)  Weight: 220 lb 12.8 oz (100.154 kg)     Body mass index is 33.58 kg/(m^2).  Visual Acuity Screening   Right  eye Left eye Both eyes  Without correction:     With correction: 20/30 20/30      Patient is in no distress; well developed, nourished and groomed; neck is supple  CARDIOVASCULAR:  Examination of carotid arteries is normal; no carotid bruits  Regular rate and rhythm, no murmurs  Examination of peripheral vascular system by observation and palpation is normal  EYES:  Ophthalmoscopic exam of optic discs and posterior segments is normal; no papilledema or hemorrhages  MUSCULOSKELETAL:  Gait, strength, tone, movements noted in Neurologic exam  below  NEUROLOGIC: MENTAL STATUS:  No flowsheet data found.  awake, alert, oriented to person, place and time  recent and remote memory intact  normal attention and concentration  language fluent, comprehension intact, naming intact,   fund of knowledge appropriate  CRANIAL NERVE:   2nd - no papilledema on fundoscopic exam  2nd, 3rd, 4th, 6th - pupils equal and reactive to light, visual fields full to confrontation, extraocular muscles intact, no nystagmus  5th - facial sensation symmetric  7th - facial strength symmetric  8th - hearing intact  9th - palate elevates symmetrically, uvula midline  11th - shoulder shrug symmetric  12th - tongue protrusion midline  MOTOR:   normal bulk and tone, full strength in the BUE, BLE  SENSORY:   normal and symmetric to light touch; DECR VIB, TEMP AND PP IN THE FEET  COORDINATION:   finger-nose-finger, fine finger movements normal  REFLEXES:   deep tendon reflexes --> BUE 1; KNEES 1; ANKLES 0  GAIT/STATION:   narrow based gait; CAUTIOUS GAIT; romberg is negative     DIAGNOSTIC DATA (LABS, IMAGING, TESTING) - I reviewed patient records, labs, notes, testing and imaging myself where available.  Lab Results  Component Value Date   WBC 4.5 05/25/2015   HGB 12.9 05/25/2015   HCT 38.8 05/25/2015   MCV 82.0 05/25/2015   PLT 174 05/25/2015      Component Value  Date/Time   NA 145 05/25/2015 1551   NA 138 11/10/2013 0526   K 3.3* 05/25/2015 1551   K 4.0 11/10/2013 0526   CL 109 05/25/2015 1551   CL 106 11/10/2013 0526   CO2 29 05/25/2015 1551   CO2 28 11/10/2013 0526   GLUCOSE 103* 05/25/2015 1551   GLUCOSE 152* 11/10/2013 0526   GLUCOSE 358* 04/09/2006 0904   BUN 17 05/25/2015 1551   BUN 14 11/10/2013 0526   CREATININE 0.66 05/25/2015 1551   CREATININE 0.88 12/20/2013 1644   CREATININE 1.28 11/10/2013 0526   CALCIUM 9.8 05/25/2015 1551   CALCIUM 9.6 05/25/2015 1551   CALCIUM 8.8 11/10/2013 0526   PROT 6.9 05/25/2015 1551   PROT 7.5 11/08/2013 1618   ALBUMIN 4.0 05/25/2015 1551   ALBUMIN 3.5 11/08/2013 1618   AST 24 05/25/2015 1551   AST 15 11/08/2013 1618   ALT 20 05/25/2015 1551   ALT 19 11/08/2013 1618   ALKPHOS 124 05/25/2015 1551   ALKPHOS 157* 11/08/2013 1618   BILITOT 0.5 05/25/2015 1551   BILITOT 0.4 11/08/2013 1618   GFRNONAA >60 05/25/2015 1551   GFRNONAA 46* 11/10/2013 0526   GFRAA >60 05/25/2015 1551   GFRAA 53* 11/10/2013 0526   Lab Results  Component Value Date   CHOL 136 06/19/2012   HDL 30* 06/19/2012   LDLCALC 45 06/19/2012   LDLDIRECT 113* 07/05/2013   TRIG 304* 06/19/2012   CHOLHDL 4.5 06/19/2012   Lab Results  Component Value Date   HGBA1C 6.3 10/19/2015   Lab Results  Component Value Date   VITAMINB12 210 05/25/2015   Lab Results  Component Value Date   TSH 3.26 11/17/2015    02/01/08 MRI lumbar spine  - The main observation is an HNP on the left at L4-5.     ASSESSMENT AND PLAN  60 y.o. year old female here with diabetic peripheral neuropathy since 2007. Patient has continued numbness and tingling in the feet. She has some balance difficulty. Symptoms have stabilized since gastric bypass surgery, which in turn has improved her balance and  walking.   Dx:  1. Diabetic polyneuropathy associated with type 2 diabetes mellitus (HCC)   2. Gait difficulty      PLAN: - No further  neurodiagnostic testing at this time - Agree with continued treatment for diabetes and neuropathy with current medications  Return for return to vocational rehab.    Penni Bombard, MD 2/0/2334, 35:68 AM Certified in Neurology, Neurophysiology and Neuroimaging  [facility] [address] [place], [place] [address] [phone]

## 2015-12-27 NOTE — Patient Instructions (Signed)
Thank you for coming to see [place] at [facility]. I hope we have been able to provide you high quality care today.  You may receive a patient satisfaction survey over the next few weeks. We would appreciate your feedback and comments so that we may continue to improve ourselves and the health of our patients.   - continue gabapentin for diabetic nerve pain / neuropathy - continue vitamin supplements - continue mild-moderate exercise - caution with balance and walking   ~~~~~~~~~~~~~~~~~~~~~~~~~~~~~~~~~~~~~~~~~~~~~~~~~~~~~~~~~~~~~~~~~  DR. Aaliyah Gavel'S GUIDE TO HAPPY AND HEALTHY LIVING These are some of my general health and wellness recommendations. Some of them may apply to you better than others. Please use common sense as you try these suggestions and feel free to ask me any questions.   ACTIVITY/FITNESS Mental, social, emotional and physical stimulation are very important for brain and body health. Try learning a new activity (arts, music, language, sports, games).  Keep moving your body to the best of your abilities. You can do this at home, inside or outside, the park, community center, gym or anywhere you like. Consider a physical therapist or personal trainer to get started. Consider the app Sworkit. Fitness trackers such as smart-watches, smart-phones or Fitbits can help as well.   NUTRITION Eat more plants: colorful vegetables, nuts, seeds and berries.  Eat less sugar, salt, preservatives and processed foods.  Avoid toxins such as cigarettes and alcohol.  Drink water when you are thirsty. Warm water with a slice of lemon is an excellent morning drink to start the day.  Consider these websites for more information The Nutrition Source (https://www.henry-hernandez.biz/) [employer] Nutrition (WindowBlog.ch)   RELAXATION Consider practicing mindfulness meditation or other relaxation techniques such as deep breathing,  prayer, yoga, tai chi, massage. See website mindful.org or the apps Headspace or Calm to help get started.   SLEEP Try to get at least 7-8+ hours sleep per day. Regular exercise and reduced caffeine will help you sleep better. Practice good sleep hygeine techniques. See website sleep.org for more information.   PLANNING Prepare estate planning, living will, healthcare POA documents. Sometimes this is best planned with the help of an attorney. Theconversationproject.org and agingwithdignity.org are excellent resources.

## 2016-01-10 DIAGNOSIS — K912 Postsurgical malabsorption, not elsewhere classified: Secondary | ICD-10-CM | POA: Diagnosis not present

## 2016-01-10 DIAGNOSIS — R197 Diarrhea, unspecified: Secondary | ICD-10-CM | POA: Diagnosis not present

## 2016-01-10 DIAGNOSIS — Z9884 Bariatric surgery status: Secondary | ICD-10-CM | POA: Diagnosis not present

## 2016-01-17 DIAGNOSIS — Z9884 Bariatric surgery status: Secondary | ICD-10-CM | POA: Diagnosis not present

## 2016-01-17 DIAGNOSIS — K912 Postsurgical malabsorption, not elsewhere classified: Secondary | ICD-10-CM | POA: Diagnosis not present

## 2016-01-17 DIAGNOSIS — G8918 Other acute postprocedural pain: Secondary | ICD-10-CM | POA: Diagnosis not present

## 2016-01-17 DIAGNOSIS — R197 Diarrhea, unspecified: Secondary | ICD-10-CM | POA: Diagnosis not present

## 2016-01-17 DIAGNOSIS — R638 Other symptoms and signs concerning food and fluid intake: Secondary | ICD-10-CM | POA: Diagnosis not present

## 2016-01-17 DIAGNOSIS — R5381 Other malaise: Secondary | ICD-10-CM | POA: Diagnosis not present

## 2016-01-17 DIAGNOSIS — R5383 Other fatigue: Secondary | ICD-10-CM | POA: Diagnosis not present

## 2016-01-17 DIAGNOSIS — Z5181 Encounter for therapeutic drug level monitoring: Secondary | ICD-10-CM | POA: Diagnosis not present

## 2016-02-20 ENCOUNTER — Ambulatory Visit (INDEPENDENT_AMBULATORY_CARE_PROVIDER_SITE_OTHER): Payer: Medicare Other | Admitting: *Deleted

## 2016-02-20 ENCOUNTER — Ambulatory Visit (HOSPITAL_COMMUNITY)
Admission: RE | Admit: 2016-02-20 | Discharge: 2016-02-20 | Disposition: A | Discharge status: Home / Self Care | Payer: Medicare Other | Source: Ambulatory Visit | Attending: Family Medicine | Admitting: Family Medicine

## 2016-02-20 ENCOUNTER — Encounter: Payer: Self-pay | Admitting: *Deleted

## 2016-02-20 VITALS — BP 181/59 | HR 51 | Temp 98.5°F | Ht 67.5 in | Wt 213.6 lb

## 2016-02-20 DIAGNOSIS — Z0000 Encounter for general adult medical examination without abnormal findings: Secondary | ICD-10-CM | POA: Diagnosis not present

## 2016-02-20 DIAGNOSIS — Z23 Encounter for immunization: Secondary | ICD-10-CM | POA: Diagnosis not present

## 2016-02-20 DIAGNOSIS — R001 Bradycardia, unspecified: Secondary | ICD-10-CM

## 2016-02-20 DIAGNOSIS — R9431 Abnormal electrocardiogram [ECG] [EKG]: Secondary | ICD-10-CM | POA: Insufficient documentation

## 2016-02-20 DIAGNOSIS — I499 Cardiac arrhythmia, unspecified: Secondary | ICD-10-CM

## 2016-02-20 NOTE — Progress Notes (Signed)
Subjective:   [name] is a 60 y.o. female who presents for an Initial Medicare Annual Wellness Visit.  Cardiac Risk Factors include: diabetes mellitus;dyslipidemia;hypertension;obesity (BMI >30kg/m2);smoking/ tobacco exposure     Objective:    Today's Vitals   02/20/16 1451  BP: (!) 181/59  Pulse: (!) 51  Temp: 98.5 F (36.9 C)  TempSrc: Oral  SpO2: 98%  Weight: 213 lb 9.6 oz (96.9 kg)  Height: 5' 7.5" (1.715 m)   Body mass index is 32.96 kg/m.  Patient reports missing 2-3 days per week of her BP meds including today. BP 181/59 and pulse 47-51. On recheck BP 160/64 manually and pulse 58 and irregular. ECG obtained per Dr. Nori Riis. 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 amLODipine (NORVASC) 10 MG tablet Take 1 tablet (10 mg total) by mouth daily.  Marland Kitchen aspirin 81 MG tablet Take 81 mg by mouth daily.  Marland Kitchen atorvastatin (LIPITOR) 40 MG tablet Take 1 tablet (40 mg total) by mouth daily.  . Blood Glucose Monitoring Suppl (ACCU-CHEK AVIVA PLUS) W/DEVICE KIT Please provide 1 device  . Cholecalciferol (VITAMIN D3) 10000 units TABS Take 1 capsule by mouth daily.  Marland Kitchen gabapentin (NEURONTIN) 300 MG capsule Take 3 capsules (900 mg total) by mouth 3 (three) times daily.  Marland Kitchen glucose blood (ACCU-CHEK SMARTVIEW) test strip 1 each by Other route 4 (four) times daily. Use as instructed to check sugar  . metoprolol succinate (TOPROL-XL) 25 MG 24 hr tablet Take 1 tablet (25 mg total) by mouth daily.  . quinapril (ACCUPRIL) 40 MG tablet Take 1 tablet (40 mg total) by mouth daily.  . Teduglutide, rDNA, (GATTEX [place]) Inject into the skin.  Marland Kitchen venlafaxine XR (EFFEXOR-XR) 75 MG 24 hr capsule TAKE 3 CAPSULES EVERY DAY  . Cholecalciferol (VITAMIN D3) 50000 units TABS Take 1 tablet by mouth once a  week. For 8 weeks  . ibuprofen (ADVIL,MOTRIN) 400 MG tablet Take 1 tablet (400 mg total) by mouth every 8 (eight) hours as needed. (Patient not taking: Reported on 02/20/2016)  . [DISCONTINUED] metroNIDAZOLE (FLAGYL) 500 MG tablet Take 1 tablet (500 mg total) by mouth 3 (three) times daily. (Patient not taking: Reported on 02/20/2016)   No facility-administered encounter medications on file as of 02/20/2016.     Allergies (verified) Keflex [cephalexin]; Adhesive [tape]; Latex; and Neosporin [neomycin-bacitracin zn-polymyx]   History: Past Medical History:  Diagnosis Date  . Allergy   . ANEMIA, PERNICIOUS, HX OF 05/13/2007  . Anxiety   . Arthritis   . ASYMPTOMATIC POSTMENOPAUSAL STATUS 02/18/2008  . BACK PAIN, LUMBAR 11/19/2007  . Cataract   . Cellulitis of left lower leg 10/28/2013  . Cellulitis of leg, right 10/11/2010  . DEPRESSION, CHRONIC 10/06/2007  . DIABETES MELLITUS, WITH NEUROLOGICAL COMPLICATIONS 12/22/1599  . DYSLIPIDEMIA 05/13/2007  . Fecal soiling 06/28/2014  . GOITER, MULTINODULAR 05/13/2007  . Hx of colonic polyps 12/04/2010  . HYPERCHOLESTEROLEMIA 03/20/2010  . HYPERTENSION 03/20/2010  . NASH (nonalcoholic steatohepatitis)   . OBSTRUCTIVE SLEEP APNEA 06/12/2007   uses CPAP  . PERIPHERAL NEUROPATHY 01/12/2009  . PSORIASIS 05/28/2010  . Sleep apnea   . UNSPECIFIED VENOUS INSUFFICIENCY 05/28/2010   Past Surgical History:  Procedure Laterality Date  . COLONOSCOPY    . ELECTROCARDIOGRAM  04/16/2006  . EXAMINATION UNDER ANESTHESIA N/A 06/29/2014  Procedure: EXAM UNDER ANESTHESIA;  Surgeon: Janyth Contes, MD;  Location: [facility];  Service: Gynecology;  Laterality: N/A;  . Exercise myoview  01/24/2005  . FLEXIBLE SIGMOIDOSCOPY    . GASTRIC BYPASS  11/09/2013  . GASTRIC BYPASS    . KNEE ARTHROSCOPY  2003   right  . ROTATOR CUFF REPAIR  2008   right  . TONSILLECTOMY    . VARICOSE VEIN SURGERY     Remotef   Family History  Problem Relation Age of Onset  .  Hyperlipidemia Father   . Hypertension Father   . Cirrhosis Father   . Colon cancer Father 2  . Colon cancer Sister 32  . Aneurysm Mother   . Cerebral aneurysm Mother   . Colon cancer Sister   . Drug abuse Neg Hx   . CAD Neg Hx   . Stomach cancer Neg Hx    Social History   Occupational History  . Unemployed     disabled   Social History Main Topics  . Smoking status: Former Smoker    Packs/day: 2.00    Years: 24.00    Types: Cigarettes    Start date: 06/24/1970    Quit date: 08/06/1994  . Smokeless tobacco: Never Used  . Alcohol use No  . Drug use: No  . Sexual activity: No    Tobacco Counseling Counseling given: No Patient is a former smoker and does not plan to restart  Activities of Daily Living In your present state of health, do you have any difficulty performing the following activities: 02/20/2016  Hearing? Y  Vision? Y  Difficulty concentrating or making decisions? Y  Walking or climbing stairs? Y  Dressing or bathing? N  Doing errands, shopping? N  Preparing Food and eating ? N  Using the Toilet? N  In the past six months, have you accidently leaked urine? N  Do you have problems with loss of bowel control? Y  Managing your Medications? N  Managing your Finances? N  Housekeeping or managing your Housekeeping? N  Some recent data might be hidden   Home Safety:  My home has a working smoke alarm:  Yes X 2           My home throw rugs have been fastened down to the floor or removed:  Removed I have non-slip mats in the bathtub and shower:  , but will obtainNo         All my home's stairs have railings or bannisters: One level home with ramp in front and deck in back with steps with railings         My home's floors, stairs and hallways are free from clutter, wires and cords:  Yes       Immunizations and Health Maintenance Immunization History  Administered Date(s) Administered  . Influenza Split 04/23/2011, 04/03/2012  . Influenza,inj,Quad PF,36+ Mos  03/01/2013, 04/14/2014, 07/25/2015  . Pneumococcal Polysaccharide-23 07/12/2010, 11/17/2015  . Td 01/18/2005  . Tdap 09/17/2011   Health Maintenance Due  Topic Date Due  . ZOSTAVAX  09/10/2014  . PAP SMEAR  01/14/2015  . OPHTHALMOLOGY EXAM  11/10/2015  . INFLUENZA VACCINE  01/23/2016   Patient will obtain zostavax at local pharmacy Patient will sched f/u with PCP for pap Patient will call eye Dr for exam. List of providers who take [facility] given to patient  Flu vaccine given today  Patient Care Team: Smiley Houseman, MD as PCP - General (Family Medicine) Kennith Center, RD as Dietitian (  Family Medicine) Macarthur Critchley, OD as Referring Physician (Optometry) Ladora Daniel, MD as Attending Physician [facility])  Indicate any recent Medical Services you may have received from other than [facility] providers in the past year (date may be approximate).     Assessment:   This is a routine wellness examination for [name].   Hearing/Vision screen  Hearing Screening   Method: Audiometry   125Hz  250Hz  500Hz  1000Hz  2000Hz  3000Hz  4000Hz  6000Hz  8000Hz   Right ear:   40 40 Fail  40    Left ear:   40 40 40  40      Dietary issues and exercise activities discussed: Current Exercise Habits: Home exercise routine (wears fitbit and usually meets goal of 6000 steps per day), Type of exercise: walking, Time (Minutes): 30, Frequency (Times/Week): 2, Weekly Exercise (Minutes/Week): 60, Intensity: Moderate  Goals    . Blood Pressure < 140/90    . HEMOGLOBIN A1C < 7.0    . Weight (lb) < 198 lb (89.8 kg)          7% weight loss      Depression Screen PHQ 2/9 Scores 02/20/2016 11/17/2015 10/19/2015 07/25/2015 08/26/2014 07/05/2013 07/05/2013  PHQ - 2 Score 2 0 0 0 0 1 1  PHQ- 9 Score 8 - - - - - -  Patient given contact ingo for BH   Fall Risk Fall Risk  02/20/2016  Falls in the past year? Yes  Number falls in past yr: 2 or more  Injury with Fall? No  Risk Factor Category  High Fall Risk  Risk for fall  due to : History of fall(s);Impaired balance/gait  Follow up Education provided;Falls prevention discussed    Cognitive Function: Mini-Cog passed with score 5/5  TUG Test:  Done in 13 seconds. Patient used both hands to push out of chair and to sit back down. Used slow tentative steps. Fall prevention discussed and literature given.  Screening Tests Health Maintenance  Topic Date Due  . ZOSTAVAX  09/10/2014  . PAP SMEAR  01/14/2015  . OPHTHALMOLOGY EXAM  11/10/2015  . INFLUENZA VACCINE  01/23/2016  . HEMOGLOBIN A1C  04/19/2016  . MAMMOGRAM  08/30/2016  . FOOT EXAM  11/16/2016  . COLONOSCOPY  10/30/2018  . TETANUS/TDAP  09/16/2021  . PNEUMOCOCCAL POLYSACCHARIDE VACCINE  Completed  . Hepatitis C Screening  Completed  . HIV Screening  Completed      Plan:     During the course of the visit, [name] was educated and counseled about the following appropriate screening and preventive services:   Vaccines to include Pneumoccal, Influenza, Td, Zostavax  Electrocardiogram  Cardiovascular disease screening  Colorectal cancer screening  Bone density screening  Diabetes screening  Glaucoma screening  Mammography/PAP  Nutrition counseling  Patient Instructions (the written plan) were given to the patient.    Velora Heckler, RN   02/20/2016    ADDENDUM  I have reviewed this visit and discussed with Howell Rucks, RN, BSN, and agree with her documentation.  Smiley Houseman, MD PGY-2 [facility] Pager [phone]

## 2016-02-20 NOTE — Patient Instructions (Signed)
Diabetes and Foot Care Diabetes may cause you to have problems because of poor blood supply (circulation) to your feet and legs. This may cause the skin on your feet to become thinner, break easier, and heal more slowly. Your skin may become dry, and the skin may peel and crack. You may also have nerve damage in your legs and feet causing decreased feeling in them. You may not notice minor injuries to your feet that could lead to infections or more serious problems. Taking care of your feet is one of the most important things you can do for yourself.  HOME CARE INSTRUCTIONS  Wear shoes at all times, even in the house. Do not go barefoot. Bare feet are easily injured.  Check your feet daily for blisters, cuts, and redness. If you cannot see the bottom of your feet, use a mirror or ask someone for help.  Wash your feet with warm water (do not use hot water) and mild soap. Then pat your feet and the areas between your toes until they are completely dry. Do not soak your feet as this can dry your skin.  Apply a moisturizing lotion or petroleum jelly (that does not contain alcohol and is unscented) to the skin on your feet and to dry, brittle toenails. Do not apply lotion between your toes.  Trim your toenails straight across. Do not dig under them or around the cuticle. File the edges of your nails with an emery board or nail file.  Do not cut corns or calluses or try to remove them with medicine.  Wear clean socks or stockings every day. Make sure they are not too tight. Do not wear knee-high stockings since they may decrease blood flow to your legs.  Wear shoes that fit properly and have enough cushioning. To break in new shoes, wear them for just a few hours a day. This prevents you from injuring your feet. Always look in your shoes before you put them on to be sure there are no objects inside.  Do not cross your legs. This may decrease the blood flow to your feet.  If you find a minor scrape,  cut, or break in the skin on your feet, keep it and the skin around it clean and dry. These areas may be cleansed with mild soap and water. Do not cleanse the area with peroxide, alcohol, or iodine.  When you remove an adhesive bandage, be sure not to damage the skin around it.  If you have a wound, look at it several times a day to make sure it is healing.  Do not use heating pads or hot water bottles. They may burn your skin. If you have lost feeling in your feet or legs, you may not know it is happening until it is too late.  Make sure your health care provider performs a complete foot exam at least annually or more often if you have foot problems. Report any cuts, sores, or bruises to your health care provider immediately. SEEK MEDICAL CARE IF:   You have an injury that is not healing.  You have cuts or breaks in the skin.  You have an ingrown nail.  You notice redness on your legs or feet.  You feel burning or tingling in your legs or feet.  You have pain or cramps in your legs and feet.  Your legs or feet are numb.  Your feet always feel cold. SEEK IMMEDIATE MEDICAL CARE IF:   There is increasing redness,  swelling, or pain in or around a wound.  There is a red line that goes up your leg.  Pus is coming from a wound.  You develop a fever or as directed by your health care provider.  You notice a bad smell coming from an ulcer or wound.   This information is not intended to replace advice given to you by your health care provider. Make sure you discuss any questions you have with your health care provider.   Document Released: 06/07/2000 Document Revised: 02/10/2013 Document Reviewed: 11/17/2012 Elsevier Interactive Patient Education 2016 [place] in the Home  Falls can cause injuries. They can happen to people of all ages. There are many things you can do to make your home safe and to help prevent falls.  WHAT CAN I DO ON THE OUTSIDE OF MY  HOME?  Regularly fix the edges of walkways and driveways and fix any cracks.  Remove anything that might make you trip as you walk through a door, such as a raised step or threshold.  Trim any bushes or trees on the path to your home.  Use bright outdoor lighting.  Clear any walking paths of anything that might make someone trip, such as rocks or tools.  Regularly check to see if handrails are loose or broken. Make sure that both sides of any steps have handrails.  Any raised decks and porches should have guardrails on the edges.  Have any leaves, snow, or ice cleared regularly.  Use sand or salt on walking paths during winter.  Clean up any spills in your garage right away. This includes oil or grease spills. WHAT CAN I DO IN THE BATHROOM?   Use night lights.  Install grab bars by the toilet and in the tub and shower. Do not use towel bars as grab bars.  Use non-skid mats or decals in the tub or shower.  If you need to sit down in the shower, use a plastic, non-slip stool.  Keep the floor dry. Clean up any water that spills on the floor as soon as it happens.  Remove soap buildup in the tub or shower regularly.  Attach bath mats securely with double-sided non-slip rug tape.  Do not have throw rugs and other things on the floor that can make you trip. WHAT CAN I DO IN THE BEDROOM?  Use night lights.  Make sure that you have a light by your bed that is easy to reach.  Do not use any sheets or blankets that are too big for your bed. They should not hang down onto the floor.  Have a firm chair that has side arms. You can use this for support while you get dressed.  Do not have throw rugs and other things on the floor that can make you trip. WHAT CAN I DO IN THE KITCHEN?  Clean up any spills right away.  Avoid walking on wet floors.  Keep items that you use a lot in easy-to-reach places.  If you need to reach something above you, use a strong step stool that has a  grab bar.  Keep electrical cords out of the way.  Do not use floor polish or wax that makes floors slippery. If you must use wax, use non-skid floor wax.  Do not have throw rugs and other things on the floor that can make you trip. WHAT CAN I DO WITH MY STAIRS?  Do not leave any items on the stairs.  Make sure that there are handrails on both sides of the stairs and use them. Fix handrails that are broken or loose. Make sure that handrails are as long as the stairways.  Check any carpeting to make sure that it is firmly attached to the stairs. Fix any carpet that is loose or worn.  Avoid having throw rugs at the top or bottom of the stairs. If you do have throw rugs, attach them to the floor with carpet tape.  Make sure that you have a light switch at the top of the stairs and the bottom of the stairs. If you do not have them, ask someone to add them for you. WHAT ELSE CAN I DO TO HELP PREVENT FALLS?  Wear shoes that:  Do not have high heels.  Have rubber bottoms.  Are comfortable and fit you well.  Are closed at the toe. Do not wear sandals.  If you use a stepladder:  Make sure that it is fully opened. Do not climb a closed stepladder.  Make sure that both sides of the stepladder are locked into place.  Ask someone to hold it for you, if possible.  Clearly mark and make sure that you can see:  Any grab bars or handrails.  First and last steps.  Where the edge of each step is.  Use tools that help you move around (mobility aids) if they are needed. These include:  Canes.  Walkers.  Scooters.  Crutches.  Turn on the lights when you go into a dark area. Replace any light bulbs as soon as they burn out.  Set up your furniture so you have a clear path. Avoid moving your furniture around.  If any of your floors are uneven, fix them.  If there are any pets around you, be aware of where they are.  Review your medicines with your doctor. Some medicines can make  you feel dizzy. This can increase your chance of falling. Ask your doctor what other things that you can do to help prevent falls.   This information is not intended to replace advice given to you by your health care provider. Make sure you discuss any questions you have with your health care provider.   Document Released: 04/06/2009 Document Revised: 10/25/2014 Document Reviewed: 07/15/2014 Elsevier Interactive Patient Education 2016 [place] Maintenance, Female Adopting a healthy lifestyle and getting preventive care can go a long way to promote health and wellness. Talk with your health care provider about what schedule of regular examinations is right for you. This is a good chance for you to check in with your provider about disease prevention and staying healthy. In between checkups, there are plenty of things you can do on your own. Experts have done a lot of research about which lifestyle changes and preventive measures are most likely to keep you healthy. Ask your health care provider for more information. WEIGHT AND DIET  Eat a healthy diet  Be sure to include plenty of vegetables, fruits, low-fat dairy products, and lean protein.  Do not eat a lot of foods high in solid fats, added sugars, or salt.  Get regular exercise. This is one of the most important things you can do for your health.  Most adults should exercise for at least 150 minutes each week. The exercise should increase your heart rate and make you sweat (moderate-intensity exercise).  Most adults should also do strengthening exercises at least twice a week. This is in addition to the  moderate-intensity exercise.  Maintain a healthy weight  Body mass index (BMI) is a measurement that can be used to identify possible weight problems. It estimates body fat based on height and weight. Your health care provider can help determine your BMI and help you achieve or maintain a healthy weight.  For females 68  years of age and older:   A BMI below 18.5 is considered underweight.  A BMI of 18.5 to 24.9 is normal.  A BMI of 25 to 29.9 is considered overweight.  A BMI of 30 and above is considered obese.  Watch levels of cholesterol and blood lipids  You should start having your blood tested for lipids and cholesterol at 61 years of age, then have this test every 5 years.  You may need to have your cholesterol levels checked more often if:  Your lipid or cholesterol levels are high.  You are older than 61 years of age.  You are at high risk for heart disease.  CANCER SCREENING   Lung Cancer  Lung cancer screening is recommended for adults 43-29 years old who are at high risk for lung cancer because of a history of smoking.  A yearly low-dose CT scan of the lungs is recommended for people who:  Currently smoke.  Have quit within the past 15 years.  Have at least a 30-pack-year history of smoking. A pack year is smoking an average of one pack of cigarettes a day for 1 year.  Yearly screening should continue until it has been 15 years since you quit.  Yearly screening should stop if you develop a health problem that would prevent you from having lung cancer treatment.  Breast Cancer  Practice breast self-awareness. This means understanding how your breasts normally appear and feel.  It also means doing regular breast self-exams. Let your health care provider know about any changes, no matter how small.  If you are in your 20s or 30s, you should have a clinical breast exam (CBE) by a health care provider every 1-3 years as part of a regular health exam.  If you are 61 or older, have a CBE every year. Also consider having a breast X-ray (mammogram) every year.  If you have a family history of breast cancer, talk to your health care provider about genetic screening.  If you are at high risk for breast cancer, talk to your health care provider about having an MRI and a mammogram  every year.  Breast cancer gene (BRCA) assessment is recommended for women who have family members with BRCA-related cancers. BRCA-related cancers include:  Breast.  Ovarian.  Tubal.  Peritoneal cancers.  Results of the assessment will determine the need for genetic counseling and BRCA1 and BRCA2 testing. Cervical Cancer Your health care provider may recommend that you be screened regularly for cancer of the pelvic organs (ovaries, uterus, and vagina). This screening involves a pelvic examination, including checking for microscopic changes to the surface of your cervix (Pap test). You may be encouraged to have this screening done every 3 years, beginning at age 48.  For women ages 5-65, health care providers may recommend pelvic exams and Pap testing every 3 years, or they may recommend the Pap and pelvic exam, combined with testing for human papilloma virus (HPV), every 5 years. Some types of HPV increase your risk of cervical cancer. Testing for HPV may also be done on women of any age with unclear Pap test results.  Other health care providers may not recommend  any screening for nonpregnant women who are considered low risk for pelvic cancer and who do not have symptoms. Ask your health care provider if a screening pelvic exam is right for you.  If you have had past treatment for cervical cancer or a condition that could lead to cancer, you need Pap tests and screening for cancer for at least 20 years after your treatment. If Pap tests have been discontinued, your risk factors (such as having a new sexual partner) need to be reassessed to determine if screening should resume. Some women have medical problems that increase the chance of getting cervical cancer. In these cases, your health care provider may recommend more frequent screening and Pap tests. Colorectal Cancer  This type of cancer can be detected and often prevented.  Routine colorectal cancer screening usually begins at 61  years of age and continues through 61 years of age.  Your health care provider may recommend screening at an earlier age if you have risk factors for colon cancer.  Your health care provider may also recommend using home test kits to check for hidden blood in the stool.  A small camera at the end of a tube can be used to examine your colon directly (sigmoidoscopy or colonoscopy). This is done to check for the earliest forms of colorectal cancer.  Routine screening usually begins at age 20.  Direct examination of the colon should be repeated every 5-10 years through 61 years of age. However, you may need to be screened more often if early forms of precancerous polyps or small growths are found. Skin Cancer  Check your skin from head to toe regularly.  Tell your health care provider about any new moles or changes in moles, especially if there is a change in a mole's shape or color.  Also tell your health care provider if you have a mole that is larger than the size of a pencil eraser.  Always use sunscreen. Apply sunscreen liberally and repeatedly throughout the day.  Protect yourself by wearing long sleeves, pants, a wide-brimmed hat, and sunglasses whenever you are outside. HEART DISEASE, DIABETES, AND HIGH BLOOD PRESSURE   High blood pressure causes heart disease and increases the risk of stroke. High blood pressure is more likely to develop in:  People who have blood pressure in the high end of the normal range (130-139/85-89 mm Hg).  People who are overweight or obese.  People who are African American.  If you are 45-64 years of age, have your blood pressure checked every 3-5 years. If you are 75 years of age or older, have your blood pressure checked every year. You should have your blood pressure measured twice--once when you are at a hospital or clinic, and once when you are not at a hospital or clinic. Record the average of the two measurements. To check your blood pressure  when you are not at a hospital or clinic, you can use:  An automated blood pressure machine at a pharmacy.  A home blood pressure monitor.  If you are between 51 years and 35 years old, ask your health care provider if you should take aspirin to prevent strokes.  Have regular diabetes screenings. This involves taking a blood sample to check your fasting blood sugar level.  If you are at a normal weight and have a low risk for diabetes, have this test once every three years after 61 years of age.  If you are overweight and have a high risk for diabetes,  consider being tested at a younger age or more often. PREVENTING INFECTION  Hepatitis B  If you have a higher risk for hepatitis B, you should be screened for this virus. You are considered at high risk for hepatitis B if:  You were born in a country where hepatitis B is common. Ask your health care provider which countries are considered high risk.  Your parents were born in a high-risk country, and you have not been immunized against hepatitis B (hepatitis B vaccine).  You have HIV or AIDS.  You use needles to inject street drugs.  You live with someone who has hepatitis B.  You have had sex with someone who has hepatitis B.  You get hemodialysis treatment.  You take certain medicines for conditions, including cancer, organ transplantation, and autoimmune conditions. Hepatitis C  Blood testing is recommended for:  Everyone born from [birth year] through 1966.  Anyone with known risk factors for hepatitis C. Sexually transmitted infections (STIs)  You should be screened for sexually transmitted infections (STIs) including gonorrhea and chlamydia if:  You are sexually active and are younger than 61 years of age.  You are older than 60 years of age and your health care provider tells you that you are at risk for this type of infection.  Your sexual activity has changed since you were last screened and you are at an increased risk  for chlamydia or gonorrhea. Ask your health care provider if you are at risk.  If you do not have HIV, but are at risk, it may be recommended that you take a prescription medicine daily to prevent HIV infection. This is called pre-exposure prophylaxis (PrEP). You are considered at risk if:  You are sexually active and do not regularly use condoms or know the HIV status of your partner(s).  You take drugs by injection.  You are sexually active with a partner who has HIV. Talk with your health care provider about whether you are at high risk of being infected with HIV. If you choose to begin PrEP, you should first be tested for HIV. You should then be tested every 3 months for as long as you are taking PrEP.  PREGNANCY   If you are premenopausal and you may become pregnant, ask your health care provider about preconception counseling.  If you may become pregnant, take 400 to 800 micrograms (mcg) of folic acid every day.  If you want to prevent pregnancy, talk to your health care provider about birth control (contraception). OSTEOPOROSIS AND MENOPAUSE   Osteoporosis is a disease in which the bones lose minerals and strength with aging. This can result in serious bone fractures. Your risk for osteoporosis can be identified using a bone density scan.  If you are 71 years of age or older, or if you are at risk for osteoporosis and fractures, ask your health care provider if you should be screened.  Ask your health care provider whether you should take a calcium or vitamin D supplement to lower your risk for osteoporosis.  Menopause may have certain physical symptoms and risks.  Hormone replacement therapy may reduce some of these symptoms and risks. Talk to your health care provider about whether hormone replacement therapy is right for you.  HOME CARE INSTRUCTIONS   Schedule regular health, dental, and eye exams.  Stay current with your immunizations.   Do not use any tobacco products  including cigarettes, chewing tobacco, or electronic cigarettes.  If you are pregnant, do not drink alcohol.  If you are breastfeeding, limit how much and how often you drink alcohol.  Limit alcohol intake to no more than 1 drink per day for nonpregnant women. One drink equals 12 ounces of beer, 5 ounces of wine, or 1 ounces of hard liquor.  Do not use street drugs.  Do not share needles.  Ask your health care provider for help if you need support or information about quitting drugs.  Tell your health care provider if you often feel depressed.  Tell your health care provider if you have ever been abused or do not feel safe at home.   This information is not intended to replace advice given to you by your health care provider. Make sure you discuss any questions you have with your health care provider.   Document Released: 12/24/2010 Document Revised: 07/01/2014 Document Reviewed: 05/12/2013 Elsevier Interactive Patient Education [employer].

## 2016-03-04 ENCOUNTER — Other Ambulatory Visit: Payer: Medicare Other

## 2016-03-06 ENCOUNTER — Encounter
Admission: RE | Admit: 2016-03-06 | Discharge: 2016-03-06 | Disposition: A | Discharge status: Home / Self Care | Payer: Medicare Other | Source: Ambulatory Visit | Attending: Bariatrics | Admitting: Bariatrics

## 2016-03-06 DIAGNOSIS — Z01812 Encounter for preprocedural laboratory examination: Secondary | ICD-10-CM | POA: Diagnosis not present

## 2016-03-06 DIAGNOSIS — K912 Postsurgical malabsorption, not elsewhere classified: Secondary | ICD-10-CM | POA: Diagnosis not present

## 2016-03-06 DIAGNOSIS — Z0183 Encounter for blood typing: Secondary | ICD-10-CM | POA: Insufficient documentation

## 2016-03-06 HISTORY — DX: Low back pain, unspecified: M54.50

## 2016-03-06 HISTORY — DX: Adhesive capsulitis of unspecified shoulder: M75.00

## 2016-03-06 HISTORY — DX: Full incontinence of feces: R15.9

## 2016-03-06 HISTORY — DX: Low back pain: M54.5

## 2016-03-06 LAB — SURGICAL PCR SCREEN
MRSA, PCR: NEGATIVE
Staphylococcus aureus: POSITIVE — AB

## 2016-03-06 LAB — TYPE AND SCREEN
ABO/RH(D): O POS
Antibody Screen: NEGATIVE

## 2016-03-06 NOTE — Pre-Procedure Instructions (Signed)
Office sent the same orders a 2nd time called office for Levaquin dose.

## 2016-03-06 NOTE — Patient Instructions (Signed)
  Your procedure is scheduled on: 03/12/16 Tues Report to Same Day Surgery 2nd floor medical mall To find out your arrival time please call [phone] between 1PM - 3PM on 03/11/16 Mon  Remember: Instructions that are not followed completely may result in serious medical risk, up to and including death, or upon the discretion of your surgeon and anesthesiologist your surgery may need to be rescheduled.    _x___ 1. Do not eat food or drink liquids after midnight. No gum chewing or hard candies.     __x__ 2. No Alcohol for 24 hours before or after surgery.   __x__3. No Smoking for 24 prior to surgery.   ____  4. Bring all medications with you on the day of surgery if instructed.    __x__ 5. Notify your doctor if there is any change in your medical condition     (cold, fever, infections).     Do not wear jewelry, make-up, hairpins, clips or nail polish.  Do not wear lotions, powders, or perfumes. You may wear deodorant.  Do not shave 48 hours prior to surgery. Men may shave face and neck.  Do not bring valuables to the hospital.    [facility] is not responsible for any belongings or valuables.               Contacts, dentures or bridgework may not be worn into surgery.  Leave your suitcase in the car. After surgery it may be brought to your room.  For patients admitted to the hospital, discharge time is determined by your treatment team.   Patients discharged the day of surgery will not be allowed to drive home.    Please read over the following fact sheets that you were given:   [facility] Preparing for Surgery and or MRSA Information   _x___ Take these medicines the morning of surgery with A SIP OF WATER:    1. amLODipine (NORVASC) 10 MG tablet  2.atorvastatin (LIPITOR) 40 MG tablet  3.gabapentin (NEURONTIN) 300 MG capsule  4.metoprolol succinate (TOPROL-XL) 25 MG 24 hr tablet  5.quinapril (ACCUPRIL) 40 MG tablet  6.venlafaxine XR (EFFEXOR-XR) 75 MG 24 hr capsule  ____  Fleet Enema (as directed)   _x___ Use CHG Soap or sage wipes as directed on instruction sheet   ____ Use inhalers on the day of surgery and bring to hospital day of surgery  ____ Stop metformin 2 days prior to surgery    ____ Take 1/2 of usual insulin dose the night before surgery and none on the morning of           surgery.   ____ Stop aspirin or coumadin, or plavix  _x__ Stop Anti-inflammatories such as Advil, Aleve, Ibuprofen, Motrin, Naproxen,          Naprosyn, Goodies powders or aspirin products. Ok to take Tylenol.   ____ Stop supplements until after surgery.    ____ Bring C-Pap to the hospital.

## 2016-03-06 NOTE — Progress Notes (Signed)
[facility] Phone: [phone]   Date of Visit: 03/08/2016   HPI:  DM:  - stopped taking Metformin since last visit since patient's A1c has been at goal  - A1c: 6.4 today < 6.3 (09/2015) < 5.9 (05/2015) < 6.5 (08/2014) - ophthalmology exam: due;discussed with patient  - UTD on foot exam  - UTD on PNA vaccination (PNA-23V in 10/2015)  HTN: - usually takes Norvasc and Metoprolol XL daily but usually takes in the afternoon  - has not taking medications today - misses doses abuot 3 times a week  - denies chest pain, sob, orthopnea - does not check BP at home   Vitamin D Deficiency:  - reports she takes 10000 units five times a day  - she also has a weekly Vit D medicaiton on her med list  -  last checked 10/2015 17 (from 18.3)  Reports she was not feeling well the past two days. She had feer of 101.25F. Reports of dry throat/sore throat, post-nasal drip, cough with yellow sputum. No sinus pain, no ear pain. No nausea, vomiting, abdominal pain, sob, diarrhea. She is feeling better today and reports she is "almost . + sick contact: daughter was ill last week.   Reports she is getting surgery this coming Tuesday as she was diagnosed with short bowel syndrome as a complication of her bariatric surgery in 2015. She has issues with chronic diarrhea and hopes this surgery will improve/resolve her symptoms.   ROS: See HPI.  [place]:  PMH:  HTN, Venous Insufficiency, Nonobstructive CAD, OSA, DM2 with neuropathy, Hypercholesterolemia CKD II Multinodular goiter (last TSH 3.26; US soft tissue neck in 09/2004 showed multinodular goiter with specific small nodule for which was recommended to be followed by repeat US in 3-6 months Anemia S/p Bariatric surgery 2015: has chronic diarrhea Vit D Deficiency: last checked 10/2015 17 (from 18.3) History of tobacco use: 1ppd/day for 24 years; no longer smoking History of Colonic Tubular Adenoma: Last colonoscopy in 10/2015 with 3  biopsies with tubular adenoma; GI recommends follow up colonoscopy in 3 years.   PHYSICAL EXAM: BP 138/72   Pulse (!) 57   Temp 98.8 F (37.1 C) (Oral)   Ht 5' 8"  (1.727 m)   Wt 210 lb 6.4 oz (95.4 kg)   BMI 31.99 kg/m   Repeat BP: 138/72 (manual) GEN: NAD HEENT: Atraumatic, normocephalic, neck supple without cervical lymphadenopathy, EOMI, sclera clear, OP normal. Sinuses nontender to palpation, Right TM normal. Left TM: left portion normal, not fully visualized due to wax.  CV: RRR, no murmurs, rubs, or gallops PULM: CTAB, normal effort SKIN: No rash or cyanosis; warm and well-perfused PSYCH: Mood and affect euthymic, normal rate and volume of speech NEURO: Awake, alert, no focal deficits grossly, normal speech   ASSESSMENT/PLAN:  Health Maintenance: - Pap: will do at follow up visit in 2 weeks - Zostavax: reports she will get at pharmacy  - influenza vaccine: obtained in clinic today   Essential hypertension Initially elevated but normal on repeat. Misses multiple doses of medications. Additionally heart rate is mildly low; she does report of some fatigue. I will consider decreasing beta blocker at next visit to see if it helps with this symptom. Asked patient to record BP daily and bring to next visit in 2 weeks. Asked patient to remember to take meds.  - BMP today   Type 2 diabetes, controlled, with neuropathy A1c stable from 09/2015. Has been off Metformin since April.  Due for  ophthalmology exam   HYPERCHOLESTEROLEMIA Repeat lipid panel (fasting) in two weeks.   Vitamin D deficiency Last checked 10/2015 17 (from 18.3). Repeat level today   Viral Pharyngitis with Cough: Likely viral in etiology. Is currently afebrile and well appearing with no lung findings on exam. Symptoms improving and almost resolved per patienet. No sign of Group B strep pharyngitis;Centor score 0, so unlikely. Since symptoms are improving and no findings on lung exam, CXR is not indicated  currently.  - monitor; return if symptoms worsen or do not improve.   FOLLOW UP: Follow up in 2 weeks for HTN and PAP   Smiley Houseman, MD PGY [place]

## 2016-03-06 NOTE — Pre-Procedure Instructions (Signed)
Dr Bernette Mayers office notified regarding no Emend po and requested dosage for Levaquin.

## 2016-03-08 ENCOUNTER — Ambulatory Visit (INDEPENDENT_AMBULATORY_CARE_PROVIDER_SITE_OTHER): Payer: Medicare Other | Admitting: Internal Medicine

## 2016-03-08 ENCOUNTER — Encounter: Payer: Self-pay | Admitting: Internal Medicine

## 2016-03-08 VITALS — BP 138/72 | HR 57 | Temp 98.8°F | Ht 68.0 in | Wt 210.4 lb

## 2016-03-08 DIAGNOSIS — I10 Essential (primary) hypertension: Secondary | ICD-10-CM | POA: Diagnosis not present

## 2016-03-08 DIAGNOSIS — E559 Vitamin D deficiency, unspecified: Secondary | ICD-10-CM | POA: Diagnosis not present

## 2016-03-08 DIAGNOSIS — J029 Acute pharyngitis, unspecified: Secondary | ICD-10-CM | POA: Diagnosis not present

## 2016-03-08 DIAGNOSIS — E7800 Pure hypercholesterolemia, unspecified: Secondary | ICD-10-CM

## 2016-03-08 DIAGNOSIS — E1140 Type 2 diabetes mellitus with diabetic neuropathy, unspecified: Secondary | ICD-10-CM

## 2016-03-08 LAB — BASIC METABOLIC PANEL
BUN: 11 mg/dL (ref 7–25)
CHLORIDE: 104 mmol/L (ref 98–110)
CO2: 28 mmol/L (ref 20–31)
CREATININE: 0.7 mg/dL (ref 0.50–0.99)
Calcium: 9.4 mg/dL (ref 8.6–10.4)
Glucose, Bld: 88 mg/dL (ref 65–99)
Potassium: 4 mmol/L (ref 3.5–5.3)
Sodium: 141 mmol/L (ref 135–146)

## 2016-03-08 LAB — POCT GLYCOSYLATED HEMOGLOBIN (HGB A1C): Hemoglobin A1C: 6.4

## 2016-03-08 NOTE — Pre-Procedure Instructions (Signed)
Nasal swab results faxed to Dr. Bernette Mayers office. Spoke with Elmyra Ricks at his office regarding fax.

## 2016-03-08 NOTE — Patient Instructions (Addendum)
Please check your blood pressure daily and keep a log  Please remember to take your blood pressure medications daily  Follow up in 2 weeks for blood pressure and your PAP smear  We will get lab work today: to check kidney function and vitamin D level Please come in fasting for your next appointment so we can check your cholesterol .

## 2016-03-09 ENCOUNTER — Encounter: Payer: Self-pay | Admitting: Internal Medicine

## 2016-03-09 DIAGNOSIS — D126 Benign neoplasm of colon, unspecified: Secondary | ICD-10-CM | POA: Insufficient documentation

## 2016-03-09 DIAGNOSIS — E559 Vitamin D deficiency, unspecified: Secondary | ICD-10-CM | POA: Insufficient documentation

## 2016-03-09 HISTORY — DX: Benign neoplasm of colon, unspecified: D12.6

## 2016-03-09 HISTORY — DX: Vitamin D deficiency, unspecified: E55.9

## 2016-03-09 LAB — VITAMIN D 25 HYDROXY (VIT D DEFICIENCY, FRACTURES): Vit D, 25-Hydroxy: 24 ng/mL — ABNORMAL LOW (ref 30–100)

## 2016-03-09 NOTE — Assessment & Plan Note (Signed)
A1c stable from 09/2015. Has been off Metformin since April.  Due for ophthalmology exam

## 2016-03-09 NOTE — Assessment & Plan Note (Addendum)
Initially elevated but normal on repeat. Misses multiple doses of medications. Additionally heart rate is mildly low; she does report of some fatigue. I will consider decreasing beta blocker at next visit to see if it helps with this symptom. Asked patient to record BP daily and bring to next visit in 2 weeks. Asked patient to remember to take meds.  - BMP today

## 2016-03-09 NOTE — Assessment & Plan Note (Signed)
Last checked 10/2015 17 (from 18.3). Repeat level today

## 2016-03-09 NOTE — Assessment & Plan Note (Addendum)
Repeat lipid panel (fasting) in two weeks.

## 2016-03-12 ENCOUNTER — Encounter
Admission: RE | Disposition: A | Discharge status: Home / Self Care | Payer: Self-pay | Source: Ambulatory Visit | Attending: Bariatrics

## 2016-03-12 ENCOUNTER — Telehealth: Payer: Self-pay | Admitting: Internal Medicine

## 2016-03-12 ENCOUNTER — Inpatient Hospital Stay: Payer: Medicare Other | Admitting: Anesthesiology

## 2016-03-12 ENCOUNTER — Inpatient Hospital Stay
Admission: RE | Admit: 2016-03-12 | Discharge: 2016-03-14 | DRG: 331 | Disposition: A | Discharge status: Home / Self Care | Payer: Medicare Other | Source: Ambulatory Visit | Attending: Bariatrics | Admitting: Bariatrics

## 2016-03-12 ENCOUNTER — Encounter: Payer: Self-pay | Admitting: *Deleted

## 2016-03-12 DIAGNOSIS — K912 Postsurgical malabsorption, not elsewhere classified: Principal | ICD-10-CM | POA: Diagnosis present

## 2016-03-12 DIAGNOSIS — Z9889 Other specified postprocedural states: Secondary | ICD-10-CM

## 2016-03-12 DIAGNOSIS — Z800 Family history of malignant neoplasm of digestive organs: Secondary | ICD-10-CM | POA: Diagnosis not present

## 2016-03-12 DIAGNOSIS — G4730 Sleep apnea, unspecified: Secondary | ICD-10-CM | POA: Diagnosis present

## 2016-03-12 DIAGNOSIS — Z87891 Personal history of nicotine dependence: Secondary | ICD-10-CM

## 2016-03-12 DIAGNOSIS — R197 Diarrhea, unspecified: Secondary | ICD-10-CM | POA: Diagnosis not present

## 2016-03-12 DIAGNOSIS — I10 Essential (primary) hypertension: Secondary | ICD-10-CM | POA: Diagnosis not present

## 2016-03-12 DIAGNOSIS — Z823 Family history of stroke: Secondary | ICD-10-CM | POA: Diagnosis not present

## 2016-03-12 DIAGNOSIS — R339 Retention of urine, unspecified: Secondary | ICD-10-CM | POA: Diagnosis not present

## 2016-03-12 DIAGNOSIS — Z8249 Family history of ischemic heart disease and other diseases of the circulatory system: Secondary | ICD-10-CM | POA: Diagnosis not present

## 2016-03-12 DIAGNOSIS — E119 Type 2 diabetes mellitus without complications: Secondary | ICD-10-CM | POA: Diagnosis present

## 2016-03-12 DIAGNOSIS — E039 Hypothyroidism, unspecified: Secondary | ICD-10-CM | POA: Diagnosis present

## 2016-03-12 DIAGNOSIS — K909 Intestinal malabsorption, unspecified: Secondary | ICD-10-CM | POA: Diagnosis present

## 2016-03-12 DIAGNOSIS — I2510 Atherosclerotic heart disease of native coronary artery without angina pectoris: Secondary | ICD-10-CM | POA: Diagnosis not present

## 2016-03-12 DIAGNOSIS — Z9884 Bariatric surgery status: Secondary | ICD-10-CM

## 2016-03-12 HISTORY — PX: LAPAROSCOPY: SHX197

## 2016-03-12 HISTORY — DX: Intestinal malabsorption, unspecified: K90.9

## 2016-03-12 LAB — GLUCOSE, CAPILLARY
Glucose-Capillary: 108 mg/dL — ABNORMAL HIGH (ref 65–99)
Glucose-Capillary: 203 mg/dL — ABNORMAL HIGH (ref 65–99)

## 2016-03-12 LAB — ABO/RH: ABO/RH(D): O POS

## 2016-03-12 SURGERY — LAPAROSCOPY, DIAGNOSTIC
Anesthesia: General

## 2016-03-12 MED ORDER — GABAPENTIN 300 MG PO CAPS
900.0000 mg | ORAL_CAPSULE | Freq: Three times a day (TID) | ORAL | Status: DC
Start: 2016-03-13 — End: 2016-03-14
  Administered 2016-03-13 – 2016-03-14 (×5): 900 mg via ORAL
  Filled 2016-03-12 (×5): qty 3

## 2016-03-12 MED ORDER — LEVOFLOXACIN IN D5W 500 MG/100ML IV SOLN
500.0000 mg | INTRAVENOUS | Status: AC
  Administered 2016-03-12: 500 mg via INTRAVENOUS

## 2016-03-12 MED ORDER — LEVOFLOXACIN IN D5W 500 MG/100ML IV SOLN
INTRAVENOUS | Status: AC
  Filled 2016-03-12: qty 100

## 2016-03-12 MED ORDER — QUINAPRIL HCL 10 MG PO TABS
40.0000 mg | ORAL_TABLET | Freq: Every day | ORAL | Status: DC
Start: 2016-03-13 — End: 2016-03-13
  Filled 2016-03-12: qty 4

## 2016-03-12 MED ORDER — HYDROMORPHONE HCL 1 MG/ML IJ SOLN
1.0000 mg | INTRAMUSCULAR | Status: DC | PRN
  Administered 2016-03-12: 1 mg via INTRAVENOUS
  Filled 2016-03-12: qty 2
  Filled 2016-03-12: qty 1

## 2016-03-12 MED ORDER — FENTANYL CITRATE (PF) 100 MCG/2ML IJ SOLN
INTRAMUSCULAR | Status: DC | PRN
  Administered 2016-03-12 (×2): 50 ug via INTRAVENOUS
  Administered 2016-03-12: 100 ug via INTRAVENOUS

## 2016-03-12 MED ORDER — FAMOTIDINE IN NACL 20-0.9 MG/50ML-% IV SOLN
20.0000 mg | Freq: Two times a day (BID) | INTRAVENOUS | Status: AC
  Administered 2016-03-12 – 2016-03-13 (×2): 20 mg via INTRAVENOUS
  Filled 2016-03-12 (×3): qty 50

## 2016-03-12 MED ORDER — SODIUM CHLORIDE 0.9 % IV SOLN
INTRAVENOUS | Status: DC
  Administered 2016-03-12: 50 mL/h via INTRAVENOUS

## 2016-03-12 MED ORDER — ENOXAPARIN SODIUM 30 MG/0.3ML ~~LOC~~ SOLN
30.0000 mg | Freq: Two times a day (BID) | SUBCUTANEOUS | Status: DC
  Administered 2016-03-13 – 2016-03-14 (×3): 30 mg via SUBCUTANEOUS
  Filled 2016-03-12 (×3): qty 0.3

## 2016-03-12 MED ORDER — ONDANSETRON HCL 4 MG/2ML IJ SOLN
4.0000 mg | INTRAMUSCULAR | Status: DC | PRN
  Administered 2016-03-12 – 2016-03-13 (×3): 4 mg via INTRAVENOUS
  Filled 2016-03-12 (×3): qty 2

## 2016-03-12 MED ORDER — ENALAPRILAT 1.25 MG/ML IV SOLN
1.2500 mg | Freq: Four times a day (QID) | INTRAVENOUS | Status: DC | PRN
  Administered 2016-03-13 (×2): 1.25 mg via INTRAVENOUS
  Filled 2016-03-12 (×3): qty 1

## 2016-03-12 MED ORDER — ACETAMINOPHEN 10 MG/ML IV SOLN
INTRAVENOUS | Status: DC | PRN
  Administered 2016-03-12: 1000 mg via INTRAVENOUS

## 2016-03-12 MED ORDER — PROPOFOL 10 MG/ML IV BOLUS
INTRAVENOUS | Status: DC | PRN
  Administered 2016-03-12: 120 mg via INTRAVENOUS

## 2016-03-12 MED ORDER — DEXAMETHASONE SODIUM PHOSPHATE 10 MG/ML IJ SOLN
INTRAMUSCULAR | Status: DC | PRN
  Administered 2016-03-12: 10 mg via INTRAVENOUS

## 2016-03-12 MED ORDER — AMLODIPINE BESYLATE 10 MG PO TABS
10.0000 mg | ORAL_TABLET | Freq: Every day | ORAL | Status: DC
  Administered 2016-03-13 – 2016-03-14 (×2): 10 mg via ORAL
  Filled 2016-03-12 (×2): qty 1

## 2016-03-12 MED ORDER — FENTANYL CITRATE (PF) 100 MCG/2ML IJ SOLN
INTRAMUSCULAR | Status: AC
  Administered 2016-03-12: 25 ug via INTRAVENOUS
  Filled 2016-03-12: qty 2

## 2016-03-12 MED ORDER — MIDAZOLAM HCL 2 MG/2ML IJ SOLN
INTRAMUSCULAR | Status: DC | PRN
  Administered 2016-03-12: 2 mg via INTRAVENOUS

## 2016-03-12 MED ORDER — SCOPOLAMINE 1 MG/3DAYS TD PT72
1.0000 | MEDICATED_PATCH | Freq: Once | TRANSDERMAL | Status: DC
  Administered 2016-03-12: 1.5 mg via TRANSDERMAL

## 2016-03-12 MED ORDER — ONDANSETRON HCL 4 MG/2ML IJ SOLN: 4.0000 mg | Freq: Once | INTRAMUSCULAR | Status: DC | PRN

## 2016-03-12 MED ORDER — SCOPOLAMINE 1 MG/3DAYS TD PT72
MEDICATED_PATCH | TRANSDERMAL | Status: AC
  Administered 2016-03-12: 1.5 mg via TRANSDERMAL
  Filled 2016-03-12: qty 1

## 2016-03-12 MED ORDER — BUPIVACAINE-EPINEPHRINE (PF) 0.25% -1:200000 IJ SOLN
INTRAMUSCULAR | Status: AC
  Filled 2016-03-12: qty 60

## 2016-03-12 MED ORDER — BUPIVACAINE-EPINEPHRINE 0.25% -1:200000 IJ SOLN
INTRAMUSCULAR | Status: DC | PRN
  Administered 2016-03-12: 60 mL

## 2016-03-12 MED ORDER — POTASSIUM CHLORIDE IN NACL 20-0.45 MEQ/L-% IV SOLN
INTRAVENOUS | Status: DC
  Administered 2016-03-12 – 2016-03-14 (×5): via INTRAVENOUS
  Filled 2016-03-12 (×9): qty 1000

## 2016-03-12 MED ORDER — SUGAMMADEX SODIUM 200 MG/2ML IV SOLN
INTRAVENOUS | Status: DC | PRN
  Administered 2016-03-12: 200 mg via INTRAVENOUS

## 2016-03-12 MED ORDER — FENTANYL CITRATE (PF) 100 MCG/2ML IJ SOLN
25.0000 ug | INTRAMUSCULAR | Status: DC | PRN
  Administered 2016-03-12: 25 ug via INTRAVENOUS

## 2016-03-12 MED ORDER — VENLAFAXINE HCL ER 75 MG PO CP24
75.0000 mg | ORAL_CAPSULE | Freq: Every day | ORAL | Status: DC
  Administered 2016-03-13 – 2016-03-14 (×2): 75 mg via ORAL
  Filled 2016-03-12 (×2): qty 1

## 2016-03-12 MED ORDER — ATROPINE SULFATE 0.4 MG/ML IJ SOLN
INTRAMUSCULAR | Status: DC | PRN
  Administered 2016-03-12: 0.4 mg via INTRAVENOUS

## 2016-03-12 MED ORDER — PREMIER PROTEIN SHAKE
2.0000 [oz_av] | ORAL | Status: DC
  Administered 2016-03-14 (×3): 2 [oz_av] via ORAL

## 2016-03-12 MED ORDER — ACETAMINOPHEN 160 MG/5ML PO SOLN
650.0000 mg | ORAL | Status: DC | PRN
  Filled 2016-03-12: qty 20.3

## 2016-03-12 MED ORDER — ACETAMINOPHEN 10 MG/ML IV SOLN
INTRAVENOUS | Status: AC
  Filled 2016-03-12: qty 100

## 2016-03-12 MED ORDER — GLYCOPYRROLATE 0.2 MG/ML IJ SOLN
INTRAMUSCULAR | Status: DC | PRN
  Administered 2016-03-12: 0.2 mg via INTRAVENOUS

## 2016-03-12 MED ORDER — GLYCOPYRROLATE 0.2 MG/ML IJ SOLN
INTRAMUSCULAR | Status: AC
  Filled 2016-03-12: qty 1

## 2016-03-12 MED ORDER — LIDOCAINE HCL (CARDIAC) 20 MG/ML IV SOLN
INTRAVENOUS | Status: DC | PRN
  Administered 2016-03-12: 30 mg via INTRAVENOUS

## 2016-03-12 MED ORDER — METOPROLOL SUCCINATE ER 25 MG PO TB24
25.0000 mg | ORAL_TABLET | Freq: Every day | ORAL | Status: DC
  Administered 2016-03-13 – 2016-03-14 (×2): 25 mg via ORAL
  Filled 2016-03-12 (×2): qty 1

## 2016-03-12 MED ORDER — METRONIDAZOLE IN NACL 5-0.79 MG/ML-% IV SOLN
500.0000 mg | Freq: Once | INTRAVENOUS | Status: AC
  Administered 2016-03-12: 500 mg via INTRAVENOUS
  Filled 2016-03-12: qty 100

## 2016-03-12 MED ORDER — DIPHENHYDRAMINE HCL 50 MG/ML IJ SOLN: 25.0000 mg | Freq: Four times a day (QID) | INTRAMUSCULAR | Status: DC | PRN

## 2016-03-12 MED ORDER — ROCURONIUM BROMIDE 100 MG/10ML IV SOLN
INTRAVENOUS | Status: DC | PRN
  Administered 2016-03-12: 10 mg via INTRAVENOUS
  Administered 2016-03-12: 20 mg via INTRAVENOUS
  Administered 2016-03-12: 30 mg via INTRAVENOUS
  Administered 2016-03-12: 20 mg via INTRAVENOUS

## 2016-03-12 MED ORDER — ONDANSETRON HCL 4 MG/2ML IJ SOLN
INTRAMUSCULAR | Status: DC | PRN
  Administered 2016-03-12: 4 mg via INTRAVENOUS

## 2016-03-12 MED ORDER — HYDROCODONE-ACETAMINOPHEN 7.5-325 MG/15ML PO SOLN
5.0000 mL | ORAL | Status: DC | PRN
  Administered 2016-03-13 – 2016-03-14 (×2): 10 mL via ORAL
  Filled 2016-03-12 (×2): qty 15

## 2016-03-12 SURGICAL SUPPLY — 54 items
APPLIER CLIP ROT 10 11.4 M/L (STAPLE)
APR CLP MED LRG 11.4X10 (STAPLE)
BANDAGE ELASTIC 6 LF NS (GAUZE/BANDAGES/DRESSINGS) ×6 IMPLANT
BLADE SURG SZ11 CARB STEEL (BLADE) ×3 IMPLANT
BNDG CMPR MED 5X6 ELC HKLP NS (GAUZE/BANDAGES/DRESSINGS) ×2
CANISTER SUCT 1200ML W/VALVE (MISCELLANEOUS) ×3 IMPLANT
CHLORAPREP W/TINT 26ML (MISCELLANEOUS) ×3 IMPLANT
CLIP APPLIE ROT 10 11.4 M/L (STAPLE) IMPLANT
DEFOGGER SCOPE WARMER CLEARIFY (MISCELLANEOUS) ×3 IMPLANT
DRAPE UTILITY 15X26 TOWEL STRL (DRAPES) ×6 IMPLANT
FILTER LAP SMOKE EVAC STRL (MISCELLANEOUS) ×3 IMPLANT
GLOVE BIO SURGEON STRL SZ7 (GLOVE) ×3 IMPLANT
GLOVE BIOGEL PI IND STRL 8.5 (GLOVE) ×1 IMPLANT
GLOVE BIOGEL PI INDICATOR 8.5 (GLOVE) ×2
GLOVE SURG SYN 8.0 (GLOVE) ×3 IMPLANT
GLOVE SURG SYN 8.0 PF PI (GLOVE) ×1 IMPLANT
GOWN STRL REUS W/ TWL LRG LVL3 (GOWN DISPOSABLE) ×5 IMPLANT
GOWN STRL REUS W/TWL LRG LVL3 (GOWN DISPOSABLE) ×15
GRASPER SUT TROCAR 14GX15 (MISCELLANEOUS) ×3 IMPLANT
IRRIGATION STRYKERFLOW (MISCELLANEOUS) ×1 IMPLANT
IRRIGATOR STRYKERFLOW (MISCELLANEOUS)
IV NS 1000ML (IV SOLUTION)
IV NS 1000ML BAXH (IV SOLUTION) ×1 IMPLANT
KIT RM TURNOVER STRD PROC AR (KITS) ×3 IMPLANT
LABEL OR SOLS (LABEL) ×1 IMPLANT
LIQUID BAND (GAUZE/BANDAGES/DRESSINGS) ×3 IMPLANT
NDL HYPO 25X1 1.5 SAFETY (NEEDLE) IMPLANT
NDL SAFETY 22GX1.5 (NEEDLE) ×3 IMPLANT
NEEDLE HYPO 25X1 1.5 SAFETY (NEEDLE) ×3 IMPLANT
NS IRRIG 500ML POUR BTL (IV SOLUTION) ×3 IMPLANT
PACK LAP CHOLECYSTECTOMY (MISCELLANEOUS) ×3 IMPLANT
RELOAD BLUE (STAPLE) ×2 IMPLANT
RELOAD GOLD (STAPLE) ×2 IMPLANT
RELOAD GREEN (STAPLE) ×5 IMPLANT
RELOAD STAPLE 60 2.6 WHT THN (STAPLE) IMPLANT
RELOAD STAPLE 60 3.8 GOLD REG (STAPLE) IMPLANT
RELOAD STAPLE 60 4.1 GRN THCK (STAPLE) IMPLANT
RELOAD STAPLER GOLD 60MM (STAPLE) ×1 IMPLANT
RELOAD STAPLER GREEN 60MM (STAPLE) ×1 IMPLANT
RELOAD STAPLER WHITE 60MM (STAPLE) ×2 IMPLANT
SHEARS HARMONIC ACE PLUS 45CM (MISCELLANEOUS) IMPLANT
SLEEVE ENDOPATH XCEL 5M (ENDOMECHANICALS) ×6 IMPLANT
SLEEVE GASTRECTOMY 36FR VISIGI (MISCELLANEOUS) ×3 IMPLANT
STAPLER ECHELON LONG 60 440 (INSTRUMENTS) ×3 IMPLANT
STAPLER RELOAD GOLD 60MM (STAPLE) ×3
STAPLER RELOAD GREEN 60MM (STAPLE) ×3
STAPLER RELOAD WHITE 60MM (STAPLE) ×6
SUT DEVICE BRAIDED 2.0X39 (SUTURE) ×4 IMPLANT
SUT MNCRL AB 4-0 PS2 18 (SUTURE) ×3 IMPLANT
SUT VIC AB 0 CT2 27 (SUTURE) ×3 IMPLANT
SYR 20CC LL (SYRINGE) ×3 IMPLANT
TROCAR XCEL 12X100 BLDLESS (ENDOMECHANICALS) ×1 IMPLANT
TROCAR XCEL NON-BLD 5MMX100MML (ENDOMECHANICALS) ×3 IMPLANT
TUBING INSUFFLATOR HEATED (MISCELLANEOUS) ×3 IMPLANT

## 2016-03-12 NOTE — Progress Notes (Signed)
Levofloxacin Pre-op per pharmacy:  Patient condition/labs as above - will give 500 mg levofloxacin IV preop.

## 2016-03-12 NOTE — Telephone Encounter (Signed)
Attempted to call Ms. Septer to discuss lab results from recent visit. Went to [employer]; did not leave message. Will attempt again tomorrow.

## 2016-03-12 NOTE — Transfer of Care (Signed)
Immediate Anesthesia Transfer of Care Note  Patient: [name]  Procedure(s) Performed: Procedure(s): LAPAROSCOPIC ANASTOMOSIS OF INTESTINE (ENTEROENTEROSTOMY) (N/A)  Patient Location: PACU  Anesthesia Type:General  Level of Consciousness: sedated  Airway & Oxygen Therapy: Patient connected to face mask oxygen  Post-op Assessment: Post -op Vital signs reviewed and stable  Post vital signs: stable  Last Vitals:  Vitals:   03/12/16 1514 03/12/16 1834  BP: (!) 138/55 (!) 152/79  Pulse: (!) 49 62  Resp: 17 (!) 8  Temp: 36.8 C 36.2 C    Last Pain:  Vitals:   03/12/16 1834  TempSrc: Temporal         Complications: No apparent anesthesia complications

## 2016-03-12 NOTE — Anesthesia Preprocedure Evaluation (Signed)
Anesthesia Evaluation  Patient identified by MRN, date of birth, ID band Patient awake    Reviewed: Allergy & Precautions, NPO status , Patient's Chart, lab work & pertinent test results, reviewed documented beta blocker date and time   Airway Mallampati: II  TM Distance: >3 FB Neck ROM: Full    Dental  (+) Edentulous Lower, Edentulous Upper   Pulmonary sleep apnea , former smoker,    Pulmonary exam normal breath sounds clear to auscultation       Cardiovascular hypertension, Pt. on medications and Pt. on home beta blockers + CAD and + Peripheral Vascular Disease  Normal cardiovascular exam Rhythm:Regular Rate:Normal  Non obstructive CAD   Neuro/Psych PSYCHIATRIC DISORDERS Anxiety Depression Peripheral neuropathy  Neuromuscular disease    GI/Hepatic (+) Hepatitis -, UnspecifiedS/P Gastric bypass   Endo/Other  diabetes, Well Controlled, Type 2, Oral Hypoglycemic Agents  Renal/GU Renal InsufficiencyRenal disease  negative genitourinary   Musculoskeletal  (+) Arthritis , Osteoarthritis,  Chronic LBP   Abdominal (+) + obese,   Peds negative pediatric ROS (+)  Hematology  (+) anemia , Hx/o Pernicious anemia   Anesthesia Other Findings   Reproductive/Obstetrics Recto vaginal fistula                             Anesthesia Physical  Anesthesia Plan  ASA: III  Anesthesia Plan: General   Post-op Pain Management:    Induction: Intravenous  Airway Management Planned: Oral ETT  Additional Equipment:   Intra-op Plan:   Post-operative Plan: Extubation in OR  Informed Consent: I have reviewed the patients History and Physical, chart, labs and discussed the procedure including the risks, benefits and alternatives for the proposed anesthesia with the patient or authorized representative who has indicated his/her understanding and acceptance.   Dental advisory given  Plan Discussed with:  CRNA, Anesthesiologist and Surgeon  Anesthesia Plan Comments:         Anesthesia Quick Evaluation

## 2016-03-12 NOTE — Interval H&P Note (Signed)
History and Physical Interval Note:  03/12/2016 3:33 PM  [name]  has presented today for surgery, with the diagnosis of post surgical malabsorption  The various methods of treatment have been discussed with the patient and family. After consideration of risks, benefits and other options for treatment, the patient has consented to  Procedure(s): LAPAROSCOPIC ANASTOMOSIS OF INTESTINE (ENTEROENTEROSTOMY) (N/A) as a surgical intervention .  The patient's history has been reviewed, patient examined, no change in status, stable for surgery.  I have reviewed the patient's chart and labs.  Questions were answered to the patient's satisfaction.     Ladora Daniel

## 2016-03-12 NOTE — H&P (Signed)
Chief Complaint  Admin Visit  Patient's Care Team  Other: Janyth Contes MD: [address], [place], [place] [address], Ph [phone], Fax [phone] NPI: [id number] Patient's Pharmacies  [facility] [address] [facility]): [address] [place], [place] [address], Ph [phone], Fax [phone] Vitals  03/06/2016 11:08 am Ht:  5 ft 8 in  Wt:  214 lbs   BMI:  32.5   BP:  153/70 sitting L wrist  Pulse:  65 bpm      Allergies   Reviewed Allergies   ADHESIVE: Rash    KEFLEX: Hives    LATEX    NEOSPORIN (NEO-BAC-POLYM): Rash    Medications   Reviewed Medications       Accu-Chek Nano  07/07/15 filled  PRESCRIPTION [employer]UTIONS   Accu-Chek SmartView Control Solution  02/25/16 filled  PRESCRIPTION SOLUTIONS   Accu-Chek SmartView Test Strips  02/25/16 filled  PRESCRIPTION [employer]UTIONS   Alcohol Prep Pads  02/25/16 filled  PRESCRIPTION SOLUTIONS   amLODIPine 10 mg tablet  01/08/16 filled  PRESCRIPTION SOLUTIONS   aspirin 81 mg tablet,delayed release  10/27/12 filled  [place]   atorvastatin 40 mg tablet  01/08/16 filled  [place] MIS 30G  02/25/16 filled  PRESCRIPTION SOLUTIONS   gabapentin 300 mg capsule  01/08/16 filled  PRESCRIPTION SOLUTIONS   Gattex 30-Vial 5 mg subcutaneous kit  01/23/16 filled  PRESCRIPTION SOLUTIONS   Gmate Lancets 30 gauge  09/28/15 filled  PRESCRIPTION SOLUTIONS   lancing device  09/28/15 filled  PRESCRIPTION SOLUTIONS   metoprolol succinate ER 25 mg tablet,extended release 24 hr  01/08/16 filled  PRES[employer] SOLUTIONS   ondansetron 4 mg disintegrating tablet  Take 1 tablet(s) every 4-6 hours by oral route as needed.  01/17/16 filled  PRESCRIPTION SOLUTIONS   quinapril 40 mg tablet  01/08/16 filled  [place] MIS LANCING  02/25/16 filled  PRESCRIPTION SOLUTIONS   triamterene 37.5 mg-hydrochlorothiazide 25 mg tablet  06/22/13 filled  [place]    venlafaxine ER 75 mg capsule,extended release 24 hr  01/08/16 filled  PRESCRIPTION SOLUTIONS   Problems  Reviewed Problems Hypothyroidism  Diabetes mellitus  Hypercholesterolemia  Feeling stressed - Onset: 05/24/2015  Obstructive sleep apnea syndrome  Benign hypertension  Rectal prolapse - Onset: 05/24/2015  Nausea  Diarrhea  History of bariatric surgical procedure - Onset: 05/24/2015  Family History  Reviewed Family History Mother  - Hypertensive disorder    - Completed stroke   Father  - Hypertensive disorder    - Malignant neoplastic disease   Social History  Reviewed Social History General Surgery/Bariatric Smoking Status: Former smoker (Notes: quit 1996) Non-smoker Chewing Tobacco Use: N Alcohol: N Exercise: Y  Surgical History  Reviewed Surgical History Knee Surgery  Other - Rotator Cuff  Laparascopic duodenal switch - 11/09/2013  Laparascopic duodenal switch - 11/09/2013  GYN History  (not configured)  Obstetric History  Obstetric History not reviewed (last reviewed 02/15/2015) Past Medical History  Reviewed Past Medical History CPAP: Y Diabetes: Y Diarrhea: Y Dizziness: Y Excessive Thirst: Y Frequent Urination: Y High Cholesterol: Y Hypertension: Y Joint Pain: Y MRSA: Y Nausea/ Vomiting: Y Numbness/Tingling: Y Sleep apnea: Y Snoring: Y Swelling of Legs/Feet/Hands: Y Varicose Veins: Y Weight Gain: Y Weight loss: Y Notes: Eye Disease, Menopause, Slow to Heal after cuts,MRSA  Screening  None recorded.  HPI  Presents today for admin visit for sx on 9/19. Patient does taking 27m of  Aspirin but will stop taking today. Denies usage of NSAIDs, Vitamin E, fish oil, and blood thinners. Denies any recent or new medical history since previous visit. Pt is ready for bariatric surgery  ROS   Constitutional: Constitutional: no significant weight gain or loss, no fever, and (normal) chills.  Eyes: Eyes: no irritation, dry eyes, or vision change and (normal)  blurred vision and seeing double (diplopia).  ENMT: Ears: no difficulty hearing or ear pain and (normal) ringing in the ears (tinnitus). Nose: no frequent nosebleeds or nose/sinus problems. Mouth/Throat: no sore throat or mouth ulcers and (normal) swollen glands.  Cardiovascular: Cardiovascular: no palpitations or chest pain and limb swelling (Some); Varicose Veins.  Respiratory: Respiratory: no cough, wheezing, shortness of breath, or coughing up blood.  Gastrointestinal: Gastrointestinal: no vomiting, diarrhea, or abdominal pain and not vomiting blood and (normal) color: red blood in bowel movement (hematochezia).  Genitourinary: Genitourinary: no incontinence, hematuria, difficulty urinating, or increased frequency.  Musculoskeletal: Musculoskeletal: no muscle aches or weakness and arthralgias/joint pain.  Integumentary: Skin: no jaundice, rashes, or abnormal mole and (normal) breast pain.  Neurologic: Neurologic: no weakness, seizures, dizziness, headaches, or loss of consciousness and numbness (/Tingling).  Psychiatric: Psych: no sleep disturbances and depression [place]/Anxiety); Mood Disturbance.  Endocrine: Endocrine: no fatigue and (normal) dry skin.  Hematologic/Lymphatic: Hematologic/Lymphatic no bruising or blood clot.  Allergic/Immunologic: Allergy/Immunologic: no itching, hives, runny nose, sinus pressure, or frequent sneezing.  Physical Exam   Patient is a 60 year old female.  Constitutional: Level of Distress: NAD. Ambulation: ambulating normally.  Psychiatric: Insight: good judgement. Mental Status: normal mood and affect and active and alert. Orientation: to time, place, and person. Memory: recent memory normal and remote memory normal.  Head: Head: normocephalic and atraumatic.  Eyes: Lids and Conjunctivae: no discharge or pallor and non-injected. EOM: EOMI. Sclerae: non-icteric.  ENMT: Ears: no lesions on external ear. Nose: no lesions on external nose or nasal discharge.  Neck:  Neck: supple, FROM, trachea midline, and no masses. Lymph Nodes: no cervical LAD, supraclavicular LAD, axillary LAD, or inguinal LAD. Thyroid: no enlargement or nodules and non-tender.  Lungs: Respiratory effort: no dyspnea. Auscultation: no wheezing, rales/crackles, or rhonchi and breath sounds normal, good air movement, and CTA except as noted.  Cardiovascular: Heart Auscultation: normal S1 and S2; no murmurs, rubs, or gallops; and RRR. Neck vessels: no carotid bruits. Pulses including femoral / pedal: normal throughout.  Abdomen: Inspection and Palpation: no tenderness, guarding, masses, rebound tenderness, or CVA tenderness and soft and non-distended. Liver: non-tender and no hepatomegaly. Spleen: non-tender and no splenomegaly. Hernia: none palpable.  Musculoskeletal:: Motor Strength and Tone: normal tone and motor strength. Joints, Bones, and Muscles: no contractures, malalignment, tenderness, or bony abnormalities and normal movement of all extremities. Extremities: no cyanosis, edema, varicosities, or palpable cord.  Neurologic: Gait and Station: normal gait and station. Cranial Nerves: grossly intact.  Skin: Inspection and palpation: no rash, lesions, ulcer, induration, nodules, or jaundice and good turgor. Nails: normal.  Procedure Documentation  None recorded.  Assessment / Plan   ASSESSMENT:  1. Administrative visit for surgery on 03/12/2016 PLAN:  PRE-OPERATIVE ORDERS  Antibiotics: Give within 60 minutes prior to incision. Send antibiotic to OR/procedure room with patient unless otherwise stated   Colon Surgery/Bariatric Surgery   __ Cefoxitin (Mefoxin) ____ gram(s) IVPB X 1  __ Cefazolin (Ancef) ____ gram(s) IVPB X 1   If allergic to Cephalosporins or PCN give __ Cipro 476m IV OCTOR followed by Flagyl 5064mIV OCTOR NO SUBSTITUES X Metronidazole (  Flagyl) 544m IVPB X 1. Begin in OR Holding __ Clindamycin (Cleocin) ____ IVPB X 1. Begin in OR Holding. Follow with Gentamicin  (Garamycin) 1.51mkg IVPB X 1. __ Clindamycin (Cleocin) ____ IVPB X 1 X Levofloxacin (Levaquin) mg IVPB X 1 __ Vancomycin (Vancocin) gram(s) IVPB X 1. Begin in OR Holding. Follow with Gentamicin (Garamycin) 1.2m51mg IVPB X 1 Other Medication Orders __ Ofirmev 1gm IV X Scopolamine patch X Emend 26m67mSCD Stockings   1. Administrative reason for encounter  Z02.9: Encounter for administrative examinations, unspecified

## 2016-03-12 NOTE — Anesthesia Procedure Notes (Signed)
Procedure Name: Intubation Date/Time: 03/12/2016 4:00 PM Performed by: Jonna Clark Pre-anesthesia Checklist: Patient identified, Patient being monitored, Timeout performed, Emergency Drugs available and Suction available Patient Re-evaluated:Patient Re-evaluated prior to inductionOxygen Delivery Method: Circle system utilized Preoxygenation: Pre-oxygenation with 100% oxygen Intubation Type: IV induction Ventilation: Mask ventilation without difficulty Laryngoscope Size: Mac and 3 Grade View: Grade I Tube type: Oral Tube size: 7.0 mm Number of attempts: 1 Placement Confirmation: ETT inserted through vocal cords under direct vision,  positive ETCO2 and breath sounds checked- equal and bilateral Secured at: 21 cm Tube secured with: Tape Dental Injury: Teeth and Oropharynx as per pre-operative assessment

## 2016-03-12 NOTE — Op Note (Signed)
PATIENT: [name] [birth year]  PROCEDURE PERFORMED: Procedure(s): LAPAROSCOPIC ANASTOMOSIS OF INTESTINE (ENTEROENTEROSTOMY) (N/A), DUODENOJEJUNAL ANASTOMOSIS PRE-OP DIAGNOSIS: Post surgical malabsorption POST-OP DIAGNOSIS: Post surgical malabsorption ESTIMATED BLOOD LOSS: Minimal SURGEON: Arletta Bale A  ASSISTANT: Liliane Bade PA  PROCEDURE NOTE: The patient was brought to the operating room and placed in the supine position. General anesthesia was obtained with orotracheal intubation. Foley catheter inserted sterilely. TED hose and Thromboguards applied. A foot board applied at the enatd of the operative bed. The chest and abdomen were sterilely prepped and draped. A 5 mm Optiview trocar introduced under direct visualization in the left upper quadrant of the abdomen. Pneumoperitoneum obtained. Four additional trocars introduced across the upper abdomen. The cecum and distal ileum identified. The small bowel was then followed approximate 280 cm cm, at which point the area of the prior duodenal ileal anastomosis was identified because of the ongoing diarrhea the old was to length and the common channel. A new ileal ileal anastomosis was  constructed approximately 10 cm proximal to this duodenal ileal anastomosis. Enterotomies being made on the antimesenteric margin of the prior biliary limb and common channel. A blue load stapler was then delivered and fired at the 50 cm mark. The resulting enterotomy was closed with a repeat firing of a white load GI stapler. Tight torsion sutures placed distal to this anastomosis. Next the duodenal ileal anastomosis was resected with a green load stapler at the line of the prior anastomotic region. Next the bowel was March 2 100 cm proximally and a duodenal ileal anastomosis reconstructed.This was done with securing seromuscular layers of the distal duodenal staple line area and the antimesenteric border of the jejunum. Enterotomies then made on the anterior  aspect of the duodenum and opposing portion of the ileum. A full-thickness running 3-0 Polysorb suture used to complete the anastomosis, the suture line then reinforced anteriorly with an additional running 2-0 Polysorb suture. The intestine was occluded distally and insufflation of the gastric tube in area of anastomosis performed. A saline bath performed, no air leak identified. At this point the ViSiGi device was withdrawn.  At this point the fascia and peritoneum of the 12 mm trocar site was closed with 0 Vicryl suture as passed by a needle suture passing system under direct visualization. The pneumoperitoneum relieved. The trocars removed. The wounds injected with 0.25% Marcaine and closed with 4-0 Monocryl in the dermis followed by Dermabond.

## 2016-03-13 ENCOUNTER — Telehealth: Payer: Self-pay | Admitting: Internal Medicine

## 2016-03-13 ENCOUNTER — Encounter: Payer: Self-pay | Admitting: Bariatrics

## 2016-03-13 DIAGNOSIS — R339 Retention of urine, unspecified: Secondary | ICD-10-CM

## 2016-03-13 LAB — COMPREHENSIVE METABOLIC PANEL
ALT: 37 U/L (ref 14–54)
ANION GAP: 6 (ref 5–15)
AST: 49 U/L — ABNORMAL HIGH (ref 15–41)
Albumin: 3.6 g/dL (ref 3.5–5.0)
Alkaline Phosphatase: 108 U/L (ref 38–126)
BUN: 15 mg/dL (ref 6–20)
CHLORIDE: 109 mmol/L (ref 101–111)
CO2: 24 mmol/L (ref 22–32)
CREATININE: 0.82 mg/dL (ref 0.44–1.00)
Calcium: 9.1 mg/dL (ref 8.9–10.3)
Glucose, Bld: 156 mg/dL — ABNORMAL HIGH (ref 65–99)
POTASSIUM: 4.5 mmol/L (ref 3.5–5.1)
Sodium: 139 mmol/L (ref 135–145)
Total Bilirubin: 0.5 mg/dL (ref 0.3–1.2)
Total Protein: 6.6 g/dL (ref 6.5–8.1)

## 2016-03-13 LAB — CBC WITH DIFFERENTIAL/PLATELET
Basophils Absolute: 0 10*3/uL (ref 0–0.1)
Basophils Relative: 0 %
EOS PCT: 0 %
Eosinophils Absolute: 0 10*3/uL (ref 0–0.7)
HCT: 36.9 % (ref 35.0–47.0)
Hemoglobin: 12.7 g/dL (ref 12.0–16.0)
LYMPHS ABS: 0.8 10*3/uL — AB (ref 1.0–3.6)
LYMPHS PCT: 9 %
MCH: 27.6 pg (ref 26.0–34.0)
MCHC: 34.4 g/dL (ref 32.0–36.0)
MCV: 80.2 fL (ref 80.0–100.0)
MONO ABS: 0.3 10*3/uL (ref 0.2–0.9)
Monocytes Relative: 4 %
NEUTROS PCT: 87 %
Neutro Abs: 7.9 10*3/uL — ABNORMAL HIGH (ref 1.4–6.5)
PLATELETS: 209 10*3/uL (ref 150–440)
RBC: 4.6 MIL/uL (ref 3.80–5.20)
RDW: 13.9 % (ref 11.5–14.5)
WBC: 9.1 10*3/uL (ref 3.6–11.0)

## 2016-03-13 MED ORDER — LISINOPRIL 20 MG PO TABS
40.0000 mg | ORAL_TABLET | Freq: Every day | ORAL | Status: DC
  Administered 2016-03-13 – 2016-03-14 (×2): 40 mg via ORAL
  Filled 2016-03-13 (×2): qty 2

## 2016-03-13 MED ORDER — FAMOTIDINE 20 MG PO TABS: 20.0000 mg | ORAL_TABLET | Freq: Two times a day (BID) | ORAL | Status: DC

## 2016-03-13 MED ORDER — FAMOTIDINE 20 MG PO TABS
20.0000 mg | ORAL_TABLET | Freq: Two times a day (BID) | ORAL | Status: DC
  Administered 2016-03-13 – 2016-03-14 (×2): 20 mg via ORAL
  Filled 2016-03-13 (×2): qty 1

## 2016-03-13 MED ORDER — KETOROLAC TROMETHAMINE 15 MG/ML IJ SOLN
15.0000 mg | Freq: Four times a day (QID) | INTRAMUSCULAR | Status: DC
  Administered 2016-03-13 – 2016-03-14 (×6): 15 mg via INTRAVENOUS
  Filled 2016-03-13 (×6): qty 1

## 2016-03-13 NOTE — Progress Notes (Signed)
Subjective: Interval History: has no complaint of nausea and lower abd pain because of inability to void.  Objective: Vital signs in last 24 hours: Temp:  [97.2 F (36.2 C)-98.1 F (36.7 C)] 98.1 F (36.7 C) (09/20 1100) Pulse Rate:  [42-66] 52 (09/20 1100) Resp:  [8-22] 19 (09/20 1100) BP: (140-200)/(52-80) 166/78 (09/20 1100) SpO2:  [90 %-99 %] 97 % (09/20 1100)  Intake/Output from previous day: 09/19 0701 - 09/20 0700 In: 1937.5 [I.V.:1887.5] Out: 50 [Urine:50] Intake/Output this shift: Total I/O In: 1625 [I.V.:1625] Out: 1375 [Urine:1375]  General appearance: alert and no distress Lungs: clear to auscultation bilaterally Abdomen: soft, non-tender; bowel sounds normal; no masses,  no organomegaly  Results for orders placed or performed during the hospital encounter of 03/12/16 (from the past 24 hour(s))  Glucose, capillary     Status: Abnormal   Collection Time: 03/12/16  6:36 PM  Result Value Ref Range   Glucose-Capillary 203 (H) 65 - 99 mg/dL  CBC WITH DIFFERENTIAL     Status: Abnormal   Collection Time: 03/13/16  5:29 AM  Result Value Ref Range   WBC 9.1 3.6 - 11.0 K/uL   RBC 4.60 3.80 - 5.20 MIL/uL   Hemoglobin 12.7 12.0 - 16.0 g/dL   HCT 36.9 35.0 - 47.0 %   MCV 80.2 80.0 - 100.0 fL   MCH 27.6 26.0 - 34.0 pg   MCHC 34.4 32.0 - 36.0 g/dL   RDW 13.9 11.5 - 14.5 %   Platelets 209 150 - 440 K/uL   Neutrophils Relative % 87 %   Neutro Abs 7.9 (H) 1.4 - 6.5 K/uL   Lymphocytes Relative 9 %   Lymphs Abs 0.8 (L) 1.0 - 3.6 K/uL   Monocytes Relative 4 %   Monocytes Absolute 0.3 0.2 - 0.9 K/uL   Eosinophils Relative 0 %   Eosinophils Absolute 0.0 0 - 0.7 K/uL   Basophils Relative 0 %   Basophils Absolute 0.0 0 - 0.1 K/uL  Comprehensive metabolic panel     Status: Abnormal   Collection Time: 03/13/16  5:29 AM  Result Value Ref Range   Sodium 139 135 - 145 mmol/L   Potassium 4.5 3.5 - 5.1 mmol/L   Chloride 109 101 - 111 mmol/L   CO2 24 22 - 32 mmol/L   Glucose,  Bld 156 (H) 65 - 99 mg/dL   BUN 15 6 - 20 mg/dL   Creatinine, Ser 0.82 0.44 - 1.00 mg/dL   Calcium 9.1 8.9 - 10.3 mg/dL   Total Protein 6.6 6.5 - 8.1 g/dL   Albumin 3.6 3.5 - 5.0 g/dL   AST 49 (H) 15 - 41 U/L   ALT 37 14 - 54 U/L   Alkaline Phosphatase 108 38 - 126 U/L   Total Bilirubin 0.5 0.3 - 1.2 mg/dL   GFR calc non Af Amer >60 >60 mL/min   GFR calc Af Amer >60 >60 mL/min   Anion gap 6 5 - 15    Studies/Results: No results found.  Scheduled Meds: . amLODipine  10 mg Oral Daily  . enoxaparin (LOVENOX) injection  30 mg Subcutaneous Q12H  . famotidine  20 mg Oral BID  . gabapentin  900 mg Oral TID  . ketorolac  15 mg Intravenous Q6H  . lisinopril  40 mg Oral Daily  . metoprolol succinate  25 mg Oral Daily  . [START ON 03/14/2016] protein supplement shake  2 oz Oral Q2H  . scopolamine      . venlafaxine  XR  75 mg Oral Q breakfast   Continuous Infusions: . 0.45 % NaCl with KCl 20 mEq / L 125 mL/hr at 03/13/16 1531   PRN Meds:acetaminophen (TYLENOL) oral liquid 160 mg/5 mL, diphenhydrAMINE, enalaprilat, HYDROcodone-acetaminophen, HYDROmorphone (DILAUDID) injection, ondansetron (ZOFRAN) IV  Assessment/Plan: Treat nausea prn, foley per urology, encourage ambulation. Home when advances with above. Will resume home bp meds.   LOS: 1 day   Ladora Daniel

## 2016-03-13 NOTE — Progress Notes (Signed)
CONCERNING: IV to Oral Route Change Policy  RECOMMENDATION: This patient is receiving famotidine by the intravenous route.  Based on criteria approved by the Pharmacy and Therapeutics Committee, the intravenous medication(s) is/are being converted to the equivalent oral dose form(s).   DESCRIPTION: These criteria include:  The patient is eating (either orally or via tube) and/or has been taking other orally administered medications for a least 24 hours  The patient has no evidence of active gastrointestinal bleeding or impaired GI absorption (gastrectomy, short bowel, patient on TNA or NPO).  If you have questions about this conversion, please contact the Pharmacy Department  []   [phone] )  [facility] [x]   [phone] )  [facility] []   [phone] )  [facility] []   [phone] )  [facility] []   [phone] )  [place], [facility] 03/13/2016 9:37 AM

## 2016-03-13 NOTE — Consult Note (Signed)
Urology Consult  I have been asked to see the patient by Dr. Duke Salvia, for evaluation and management of urinary retention, difficult Foley.  Chief Complaint: Inability to void  History of Present Illness: [name] is a 60 y.o. year old female postop day 1 status  laparoscopic anastomosis of the intestines, duodenojejunal anastomosis for history of postsurgical malabsorption.  Overnight, she had difficulty voiding and only had 50 cc out. Foley catheter was attempted by the nursing staff twice and unable to be advanced into the bladder. The urethra was able to be visualized.  She denies a personal history of urinary retention. No voiding complaints at baseline. She has had a Foley catheter previously at the time of her pregnancy.  Past Medical History:  Diagnosis Date  . Allergy   . ANEMIA, PERNICIOUS, HX OF 05/13/2007  . Anxiety   . Arthritis   . ASYMPTOMATIC POSTMENOPAUSAL STATUS 02/18/2008  . BACK PAIN, LUMBAR 11/19/2007  . Bowel incontinence   . Cancer ([place])    pre canceer polyps colon  . Cataract   . Cellulitis of left lower leg 10/28/2013  . Cellulitis of leg, right 10/11/2010  . DEPRESSION, CHRONIC 10/06/2007  . DIABETES MELLITUS, WITH NEUROLOGICAL COMPLICATIONS 3/90/3009  . DYSLIPIDEMIA 05/13/2007  . Fecal soiling 06/28/2014  . Frozen shoulder    Lt  . GOITER, MULTINODULAR 05/13/2007  . Hx of colonic polyps 12/04/2010  . HYPERCHOLESTEROLEMIA 03/20/2010  . HYPERTENSION 03/20/2010  . Lower back pain   . NASH (nonalcoholic steatohepatitis)   . OBSTRUCTIVE SLEEP APNEA 06/12/2007   uses CPAP...has not used for 2.5 years  . PERIPHERAL NEUROPATHY 01/12/2009  . PSORIASIS 05/28/2010  . Sleep apnea   . UNSPECIFIED VENOUS INSUFFICIENCY 05/28/2010    Past Surgical History:  Procedure Laterality Date  . COLONOSCOPY    . ELECTROCARDIOGRAM  04/16/2006  . EXAMINATION UNDER ANESTHESIA N/A 06/29/2014   Procedure: EXAM UNDER ANESTHESIA;  Surgeon: Janyth Contes, MD;  Location: [facility];  Service: Gynecology;  Laterality: N/A;  . Exercise myoview  01/24/2005  . FLEXIBLE SIGMOIDOSCOPY    . GASTRIC BYPASS  11/09/2013  . GASTRIC BYPASS    . KNEE ARTHROSCOPY  2003   right  . LAPAROSCOPY N/A 03/12/2016   Procedure: LAPAROSCOPIC ANASTOMOSIS OF INTESTINE (ENTEROENTEROSTOMY);  Surgeon: Ladora Daniel, MD;  Location: [facility];  Service: General;  Laterality: N/A;  . LEG SURGERY     metal and pins in lower left leg  . mrsa Right    arm  . ROTATOR CUFF REPAIR  2008   right  . SHOULDER ARTHROSCOPY W/ ROTATOR CUFF REPAIR Right   . TONSILLECTOMY    . VARICOSE VEIN SURGERY     Remotef    Home Medications:  Current Meds  Medication Sig  . amLODipine (NORVASC) 10 MG tablet Take 1 tablet (10 mg total) by mouth daily.  Marland Kitchen aspirin 81 MG tablet Take 81 mg by mouth daily.  Marland Kitchen atorvastatin (LIPITOR) 40 MG tablet Take 1 tablet (40 mg total) by mouth daily.  Marland Kitchen gabapentin (NEURONTIN) 300 MG capsule Take 3 capsules (900 mg total) by mouth 3 (three) times daily.  . metoprolol succinate (TOPROL-XL) 25 MG 24 hr tablet Take 1 tablet (25 mg total) by mouth daily.  . quinapril (ACCUPRIL) 40 MG tablet Take 1 tablet (40 mg total) by mouth daily.  . Teduglutide, rDNA, (GATTEX) 5 MG KIT Inject 5 mg into the skin daily.  Marland Kitchen venlafaxine XR (EFFEXOR-XR) 75 MG 24 hr capsule  TAKE 3 CAPSULES EVERY DAY    Allergies:  Allergies  Allergen Reactions  . Keflex [Cephalexin] Hives    Denies Airway involvement  . Adhesive [Tape] Rash    Latex  . Latex Rash  . Neosporin [Neomycin-Bacitracin Zn-Polymyx] Rash    Family History  Problem Relation Age of Onset  . Hyperlipidemia Father   . Hypertension Father   . Cirrhosis Father   . Colon cancer Father 67  . Colon cancer Sister 79  . Aneurysm Mother   . Cerebral aneurysm Mother   . Colon cancer Sister   . Drug abuse Neg Hx   . CAD Neg Hx   . Stomach cancer Neg Hx     Social History:  reports that she quit smoking about 21 years ago. Her smoking  use included Cigarettes. She started smoking about 45 years ago. She has a 48.00 pack-year smoking history. She has never used smokeless tobacco. She reports that she does not drink alcohol or use drugs.  ROS: A complete review of systems was performed.  All systems are negative except for pertinent findings as noted other than incisional pain and mild nausea.  Physical Exam:  Vital signs in last 24 hours: Temp:  [97.2 F (36.2 C)-98.2 F (36.8 C)] 98.1 F (36.7 C) (09/20 0453) Pulse Rate:  [42-66] 47 (09/20 0933) Resp:  [8-22] 18 (09/20 0453) BP: (138-200)/(52-80) 198/68 (09/20 0933) SpO2:  [90 %-99 %] 98 % (09/20 0453) Weight:  [210 lb 9 oz (95.5 kg)] 210 lb 9 oz (95.5 kg) (09/19 1514) Constitutional:  Alert and oriented, No acute distress HEENT: [place] AT, moist mucus membranes.  Trachea midline, no masses Cardiovascular: No clubbing, cyanosis, or edema. Respiratory: Normal respiratory effort, no increased work of breathing GI: Abdomen is soft, nontender, nondistended, no abdominal masses.  Obese.  Incisions clean dry and intact. GU: Normal external genitalia. Normal urethral meatus.  Skin: No rashes, bruises or suspicious lesions Neurologic: Grossly intact, no focal deficits, moving all 4 extremities Psychiatric: Normal mood and affect   Laboratory Data:   Recent Labs  03/13/16 0529  WBC 9.1  HGB 12.7  HCT 36.9    Recent Labs  03/13/16 0529  NA 139  K 4.5  CL 109  CO2 24  GLUCOSE 156*  BUN 15  CREATININE 0.82  CALCIUM 9.1   No results for input(s): LABPT, INR in the last 72 hours. No results for input(s): LABURIN in the last 72 hours. Results for orders placed or performed during the hospital encounter of 03/06/16  Surgical pcr screen     Status: Abnormal   Collection Time: 03/06/16  2:19 PM  Result Value Ref Range Status   MRSA, PCR NEGATIVE NEGATIVE Final   Staphylococcus aureus POSITIVE (A) NEGATIVE Final    Procedure: Patient was prepped and draped for  Foley catheter placement in the standard fashion. 2 assistants were used for labial retraction. The urethral meatus was clearly visualized. A 16 French council tip catheter was able to be advanced through the urethra with no resistance or difficulty. There was return of approximately 700 cc of clear yellow urine. The balloon was filled with 10 cc of sterile water.  Impression/Assessment:  1.  Difficult Foley-status post placement by urologist, advanced easily  2.  Urinary retention- postop day 1 likely secondary to narcotics, medications, and immobility  Plan:  -Recommend leaving Foley catheter in 2-3 days given history of some difficulty with catheter placement by nursing staff and fairly significant bladder distention at the  time of placement -Consider Flomax for few days if able to tolerate capusule  03/13/2016, 11:09 AM  Hollice Espy,  MD

## 2016-03-13 NOTE — Anesthesia Postprocedure Evaluation (Signed)
Anesthesia Post Note  Patient: [name]  Procedure(s) Performed: Procedure(s) (LRB): LAPAROSCOPIC ANASTOMOSIS OF INTESTINE (ENTEROENTEROSTOMY) (N/A)  Patient location during evaluation: PACU Anesthesia Type: General Level of consciousness: awake and alert and oriented Pain management: pain level controlled Vital Signs Assessment: post-procedure vital signs reviewed and stable Respiratory status: spontaneous breathing Cardiovascular status: blood pressure returned to baseline Anesthetic complications: no    Last Vitals:  Vitals:   03/13/16 0029 03/13/16 0453  BP:  (!) 170/58  Pulse: 61 63  Resp:  18  Temp:  36.7 C    Last Pain:  Vitals:   03/13/16 0453  TempSrc: Oral  PainSc:                  Alta Shober

## 2016-03-13 NOTE — Progress Notes (Signed)
Pt had only 50 ml output throughout night. Pt was bladder scanned this AM with 618 ml noted. Primary nurse and secondary nurse was unsuccessful in attempting to place in and out cath. Dr. Duke Salvia notified and orders given for a urology consult to be ordered. Primary nurse to continue to monitor.

## 2016-03-13 NOTE — Progress Notes (Signed)
Pt manual BP 200/80 and manual HR 64.  Pt asymptomatic.  Dr. Duke Salvia notified and ordered to give scheduled anti-hypertensive medications early.  BP meds administered.  Will re-check BP in 1 hour.  Will continue to monitor.

## 2016-03-13 NOTE — Progress Notes (Signed)
Initial Nutrition Assessment  DOCUMENTATION CODES:   Not applicable  INTERVENTION:  -Recommend close follow-up with bariatric RD at Dr Bernette Mayers office at discharge regarding diet progression and overall nutrition.   -Discussed bariatric diet progression focusing mostly on liquids at this time until follow-up with MD post op.  Discussed importance of protein, how to pick an appropriate protein supplement, etc.  Discussed importance of sugar free items to assist with dumping syndrome.     NUTRITION DIAGNOSIS:   Inadequate oral intake related to altered GI function as evidenced by per patient/family report.    GOAL:   Patient will meet greater than or equal to 90% of their needs    MONITOR:   PO intake, Supplement acceptance, Weight trends, Labs  REASON FOR ASSESSMENT:   Consult Diet education  ASSESSMENT:      Pt s/p lap anastomosis of intestines, duodenojejunal anastomosis.  Pt reports had duodenal switch in 2015 and MD went back in to add more intestines to not bypass as much intestines as before.  Reports has dumping syndrome and has tried everything.  Taking sips of liquids this am, noted straw at bedside. Reports that she did take a protein supplement after surgery in 2015 but was not drinking one prior to this admission.    Medications reviewed: NS with KCL at 118m/hr Labs reviewed: glucose 156  Diet Order:  Diet bariatric clear liquid Room service appropriate? Yes with Assist; Fluid consistency: Thin  Skin:   none  Last BM:   PTA  Height:   Ht Readings from Last 1 Encounters:  03/12/16 5' 8"  (1.727 m)    Weight:   Wt Readings from Last 1 Encounters:  03/12/16 210 lb 9 oz (95.5 kg)    Ideal Body Weight:     BMI:  Body mass index is 32.02 kg/m.  Estimated Nutritional Needs:   Kcal:  14818-5909kcals/d  Protein:  81-102 g/d  Fluid:  >/= 16062md  EDUCATION NEEDS:   Education needs addressed  [name] B. [name] ResidesRDPatillasLDPotter [name](pager) Weekend/On-Call pager ([phone]

## 2016-03-13 NOTE — Telephone Encounter (Signed)
Called number noted in chart. Mr. Forsman, her husband answered the phone. Patient is currently in the hospital for scheduled surgery. I told Mr. Halteman that patient's vitamin D level has improved from prior but still low. I would like her to continue Vitamin D for now and we will recheck in 3 months.

## 2016-03-14 NOTE — Discharge Summary (Signed)
Physician Discharge Summary  Patient ID: [name] MRN: [id number] DOB/AGE: [birth year] 60 y.o.  Admit date: 03/12/2016 Discharge date: 03/14/2016  Admission Diagnoses:chronic diarrhea associated with malabsorption status post duodenal switch Discharge Diagnoses:  Active Problems:   Malabsorption    Procedure(s): LAPAROSCOPIC ANASTOMOSIS OF INTESTINE (ENTEROENTEROSTOMY) (N/A)  Discharged Condition: good  Hospital Course: patient tolerated operative procedure well with creating a common channel of 500 cm. Her initial course associated with aggravated nausea and hypertension both of which improved with medications. Patient also had urinary retention managed by way of placement of Foley catheter.Patient noted be ambulatory on the second postoperative day.  Consults: urology  Significant Diagnostic Studies: none  Treatments: IV hydration  Discharge Exam: Blood pressure (!) 152/56, pulse (!) 50, temperature 98 F (36.7 C), temperature source Oral, resp. rate 18, height _0  (1.727 m), weight 95.5 kg (210 lb 9 oz), SpO2 92 %. General appearance: alert and cooperative Resp: clear to auscultation bilaterally GI: soft, non-tender; bowel sounds normal; no masses,  no organomegaly  Disposition: 01-Home or Self Care  Discharge Instructions    Ambulate hourly while awake    Complete by:  As directed    Call MD for:  persistant nausea and vomiting    Complete by:  As directed    Call MD for:  severe uncontrolled pain    Complete by:  As directed    Call MD for:  temperature >101 F    Complete by:  As directed    Diet bariatric full liquid    Complete by:  As directed    Discharge instructions    Complete by:  As directed    F/u in office as scheduled ( two weeks)   Incentive spirometry    Complete by:  As directed    Perform hourly while awake       Medication List    TAKE these medications   ACCU-CHEK AVIVA PLUS w/Device Kit Please provide 1 device   amLODipine 10 MG  tablet Commonly known as:  NORVASC Take 1 tablet (10 mg total) by mouth daily.   aspirin 81 MG tablet Take 81 mg by mouth daily.   atorvastatin 40 MG tablet Commonly known as:  LIPITOR Take 1 tablet (40 mg total) by mouth daily.   gabapentin 300 MG capsule Commonly known as:  NEURONTIN Take 3 capsules (900 mg total) by mouth 3 (three) times daily.   GATTEX 5 MG Kit Generic drug:  Teduglutide (rDNA) Inject 5 mg into the skin daily.   glucose blood test strip Commonly known as:  ACCU-CHEK SMARTVIEW 1 each by Other route 4 (four) times daily. Use as instructed to check sugar   metoprolol succinate 25 MG 24 hr tablet Commonly known as:  TOPROL-XL Take 1 tablet (25 mg total) by mouth daily.   quinapril 40 MG tablet Commonly known as:  ACCUPRIL Take 1 tablet (40 mg total) by mouth daily.   venlafaxine XR 75 MG 24 hr capsule Commonly known as:  EFFEXOR-XR TAKE 3 CAPSULES EVERY DAY   Vitamin D3 10000 units Tabs Take 1 capsule by mouth daily.        Signed: Ladora Daniel 03/14/2016, 12:15 PM

## 2016-03-15 ENCOUNTER — Encounter: Payer: Self-pay | Admitting: Emergency Medicine

## 2016-03-15 ENCOUNTER — Emergency Department
Admission: EM | Admit: 2016-03-15 | Discharge: 2016-03-16 | Disposition: A | Discharge status: Other Acute Inpatient Hospital | Payer: Medicare Other | Attending: Student in an Organized Health Care Education/Training Program | Admitting: Student in an Organized Health Care Education/Training Program

## 2016-03-15 DIAGNOSIS — Z87891 Personal history of nicotine dependence: Secondary | ICD-10-CM | POA: Diagnosis not present

## 2016-03-15 DIAGNOSIS — Z79899 Other long term (current) drug therapy: Secondary | ICD-10-CM | POA: Diagnosis not present

## 2016-03-15 DIAGNOSIS — Z85038 Personal history of other malignant neoplasm of large intestine: Secondary | ICD-10-CM | POA: Insufficient documentation

## 2016-03-15 DIAGNOSIS — I129 Hypertensive chronic kidney disease with stage 1 through stage 4 chronic kidney disease, or unspecified chronic kidney disease: Secondary | ICD-10-CM | POA: Diagnosis not present

## 2016-03-15 DIAGNOSIS — K922 Gastrointestinal hemorrhage, unspecified: Secondary | ICD-10-CM | POA: Insufficient documentation

## 2016-03-15 DIAGNOSIS — E1122 Type 2 diabetes mellitus with diabetic chronic kidney disease: Secondary | ICD-10-CM | POA: Insufficient documentation

## 2016-03-15 DIAGNOSIS — R531 Weakness: Secondary | ICD-10-CM | POA: Diagnosis not present

## 2016-03-15 DIAGNOSIS — R404 Transient alteration of awareness: Secondary | ICD-10-CM | POA: Diagnosis not present

## 2016-03-15 DIAGNOSIS — Z7982 Long term (current) use of aspirin: Secondary | ICD-10-CM | POA: Diagnosis not present

## 2016-03-15 DIAGNOSIS — R5383 Other fatigue: Secondary | ICD-10-CM | POA: Diagnosis present

## 2016-03-15 DIAGNOSIS — N182 Chronic kidney disease, stage 2 (mild): Secondary | ICD-10-CM | POA: Insufficient documentation

## 2016-03-15 DIAGNOSIS — Z91040 Latex allergy status: Secondary | ICD-10-CM | POA: Diagnosis not present

## 2016-03-15 DIAGNOSIS — D62 Acute posthemorrhagic anemia: Secondary | ICD-10-CM | POA: Diagnosis not present

## 2016-03-15 HISTORY — DX: Gastrointestinal hemorrhage, unspecified: K92.2

## 2016-03-15 LAB — BASIC METABOLIC PANEL
ANION GAP: 4 — AB (ref 5–15)
BUN: 36 mg/dL — ABNORMAL HIGH (ref 6–20)
CALCIUM: 8.9 mg/dL (ref 8.9–10.3)
CO2: 25 mmol/L (ref 22–32)
CREATININE: 0.92 mg/dL (ref 0.44–1.00)
Chloride: 108 mmol/L (ref 101–111)
Glucose, Bld: 167 mg/dL — ABNORMAL HIGH (ref 65–99)
Potassium: 4.3 mmol/L (ref 3.5–5.1)
SODIUM: 137 mmol/L (ref 135–145)

## 2016-03-15 LAB — CBC
HCT: 24.2 % — ABNORMAL LOW (ref 35.0–47.0)
HEMOGLOBIN: 8.2 g/dL — AB (ref 12.0–16.0)
MCH: 27.6 pg (ref 26.0–34.0)
MCHC: 34 g/dL (ref 32.0–36.0)
MCV: 81.1 fL (ref 80.0–100.0)
PLATELETS: 235 10*3/uL (ref 150–440)
RBC: 2.98 MIL/uL — AB (ref 3.80–5.20)
RDW: 14.1 % (ref 11.5–14.5)
WBC: 9 10*3/uL (ref 3.6–11.0)

## 2016-03-15 LAB — PREPARE RBC (CROSSMATCH)

## 2016-03-15 MED ORDER — SODIUM CHLORIDE 0.9 % IV SOLN
8.0000 mg/h | INTRAVENOUS | Status: DC
  Administered 2016-03-15: 8 mg/h via INTRAVENOUS
  Filled 2016-03-15: qty 80

## 2016-03-15 MED ORDER — SODIUM CHLORIDE 0.9 % IV SOLN
80.0000 mg | Freq: Once | INTRAVENOUS | Status: AC
  Administered 2016-03-15: 80 mg via INTRAVENOUS
  Filled 2016-03-15: qty 80

## 2016-03-15 MED ORDER — PANTOPRAZOLE SODIUM 40 MG IV SOLR: 40.0000 mg | Freq: Two times a day (BID) | INTRAVENOUS | Status: DC

## 2016-03-15 NOTE — ED Notes (Signed)
Pharmacy called and notified of need of protonix drip.

## 2016-03-15 NOTE — ED Triage Notes (Signed)
Per [facility], patient comes from home with c/o generalized weakness. Patient was here Tuesday for bowel surgery ("they needed to make my bowels longer") and discharged yesterday. Since then patient has been feeling weak. Had 3 falls at home to her knees. 5 incisions noted to abdomen. No signs of infection noted. Patient A&O x4.

## 2016-03-15 NOTE — ED Notes (Signed)
Consent for blood transfusion has been signed.

## 2016-03-15 NOTE — ED Notes (Signed)
Blood bank called to check on status of blood. They state 2 units are now ready for pick up.

## 2016-03-15 NOTE — ED Notes (Addendum)
[facility]

## 2016-03-15 NOTE — ED Provider Notes (Addendum)
[facility] Emergency Department Provider Note    First MD Initiated Contact with Patient 03/15/16 1803     (approximate)  I have reviewed the triage vital signs and the nursing notes.   HISTORY  Chief Complaint Fatigue    HPI [name] is a 61 y.o. female who presents with 1 day of fatigue after being discharged from the hospital for laparoscopic enterostomy procedure. States that she went home yesterday was feeling very fatigued. States that she had several dark colored stools yesterday. Presented to the ER today. She's had several falls down to her knees from near syncopal symptoms upon standing. She denies any chest pain, shortness of breath. Is not on any blood thinners. Denies any history of gastritis.   Past Medical History:  Diagnosis Date  . Allergy   . ANEMIA, PERNICIOUS, HX OF 05/13/2007  . Anxiety   . Arthritis   . ASYMPTOMATIC POSTMENOPAUSAL STATUS 02/18/2008  . BACK PAIN, LUMBAR 11/19/2007  . Bowel incontinence   . Cancer ([place])    pre canceer polyps colon  . Cataract   . Cellulitis of left lower leg 10/28/2013  . Cellulitis of leg, right 10/11/2010  . DEPRESSION, CHRONIC 10/06/2007  . DIABETES MELLITUS, WITH NEUROLOGICAL COMPLICATIONS 9/62/9528  . DYSLIPIDEMIA 05/13/2007  . Fecal soiling 06/28/2014  . Frozen shoulder    Lt  . GOITER, MULTINODULAR 05/13/2007  . Hx of colonic polyps 12/04/2010  . HYPERCHOLESTEROLEMIA 03/20/2010  . HYPERTENSION 03/20/2010  . Lower back pain   . NASH (nonalcoholic steatohepatitis)   . OBSTRUCTIVE SLEEP APNEA 06/12/2007   uses CPAP...has not used for 2.5 years  . PERIPHERAL NEUROPATHY 01/12/2009  . PSORIASIS 05/28/2010  . Sleep apnea   . UNSPECIFIED VENOUS INSUFFICIENCY 05/28/2010    Patient Active Problem List   Diagnosis Date Noted  . Malabsorption 03/12/2016  . Tubular adenoma of colon 03/09/2016  . Vitamin D deficiency 03/09/2016  . Adhesive capsulitis of left shoulder 10/20/2015  . Diarrhea  05/25/2014  . Rectovaginal fistula 05/25/2014  . Fall at home 01/04/2014  . Hearing loss 12/20/2013  . S/P bariatric surgery-duodenal switch with sleeve gastrectomy 12/04/2013  . Nonobstructive CAD  08/26/2013  . Anemia 06/19/2012  . CKD (chronic kidney disease), stage II 06/04/2012  . Low back pain 03/05/2012  . At high risk for falls 06/12/2011  . Osteoarthritis of knee 04/23/2011  . UNSPECIFIED VENOUS INSUFFICIENCY 05/28/2010  . PSORIASIS 05/28/2010  . HYPERCHOLESTEROLEMIA 03/20/2010  . Essential hypertension 03/20/2010  . Chronic dermatitis of hands 05/02/2009  . Peripheral neuropathy ([place]) 01/12/2009  . Type 2 diabetes, controlled, with neuropathy ([place]) 02/18/2008  . Chronic depression 10/06/2007  . OBSTRUCTIVE SLEEP APNEA 06/12/2007  . GOITER, MULTINODULAR 05/13/2007    Past Surgical History:  Procedure Laterality Date  . COLONOSCOPY    . ELECTROCARDIOGRAM  04/16/2006  . EXAMINATION UNDER ANESTHESIA N/A 06/29/2014   Procedure: EXAM UNDER ANESTHESIA;  Surgeon: Janyth Contes, MD;  Location: [facility];  Service: Gynecology;  Laterality: N/A;  . Exercise myoview  01/24/2005  . FLEXIBLE SIGMOIDOSCOPY    . GASTRIC BYPASS  11/09/2013  . GASTRIC BYPASS    . KNEE ARTHROSCOPY  2003   right  . LAPAROSCOPY N/A 03/12/2016   Procedure: LAPAROSCOPIC ANASTOMOSIS OF INTESTINE (ENTEROENTEROSTOMY);  Surgeon: Ladora Daniel, MD;  Location: [facility];  Service: General;  Laterality: N/A;  . LEG SURGERY     metal and pins in lower left leg  . mrsa Right    arm  .  ROTATOR CUFF REPAIR  2008   right  . SHOULDER ARTHROSCOPY W/ ROTATOR CUFF REPAIR Right   . TONSILLECTOMY    . VARICOSE VEIN SURGERY     Remotef    Prior to Admission medications   Medication Sig Start Date End Date Taking? Authorizing Provider  amLODipine (NORVASC) 10 MG tablet Take 1 tablet (10 mg total) by mouth daily. 07/24/15   Leone Brand, MD  aspirin 81 MG tablet Take 81 mg by mouth daily.    Historical Provider,  MD  atorvastatin (LIPITOR) 40 MG tablet Take 1 tablet (40 mg total) by mouth daily. 07/24/15   Leone Brand, MD  Blood Glucose Monitoring Suppl (ACCU-CHEK AVIVA PLUS) W/DEVICE KIT Please provide 1 device 12/13/13   Marin Olp, MD  Cholecalciferol (VITAMIN D3) 10000 units TABS Take 1 capsule by mouth daily.    Historical Provider, MD  gabapentin (NEURONTIN) 300 MG capsule Take 3 capsules (900 mg total) by mouth 3 (three) times daily. 07/24/15   Leone Brand, MD  glucose blood (ACCU-CHEK SMARTVIEW) test strip 1 each by Other route 4 (four) times daily. Use as instructed to check sugar 09/27/15   Leone Brand, MD  metoprolol succinate (TOPROL-XL) 25 MG 24 hr tablet Take 1 tablet (25 mg total) by mouth daily. 07/24/15   Leone Brand, MD  quinapril (ACCUPRIL) 40 MG tablet Take 1 tablet (40 mg total) by mouth daily. 07/24/15   Leone Brand, MD  Teduglutide, rDNA, (GATTEX) 5 MG KIT Inject 5 mg into the skin daily.    Historical Provider, MD  venlafaxine XR (EFFEXOR-XR) 75 MG 24 hr capsule TAKE 3 CAPSULES EVERY DAY 07/24/15   Leone Brand, MD    Allergies Keflex [cephalexin]; Adhesive [tape]; Latex; and Neosporin [neomycin-bacitracin zn-polymyx]  Family History  Problem Relation Age of Onset  . Hyperlipidemia Father   . Hypertension Father   . Cirrhosis Father   . Colon cancer Father 71  . Colon cancer Sister 48  . Aneurysm Mother   . Cerebral aneurysm Mother   . Colon cancer Sister   . Drug abuse Neg Hx   . CAD Neg Hx   . Stomach cancer Neg Hx     Social History Social History  Substance Use Topics  . Smoking status: Former Smoker    Packs/day: 2.00    Years: 24.00    Types: Cigarettes    Start date: 06/24/1970    Quit date: 08/06/1994  . Smokeless tobacco: Never Used  . Alcohol use No    Review of Systems Patient denies headaches, rhinorrhea, blurry vision, numbness, shortness of breath, chest pain, edema, cough, abdominal pain, nausea, vomiting, diarrhea, dysuria,  fevers, rashes or hallucinations unless otherwise stated above in HPI. ____________________________________________   PHYSICAL EXAM:  VITAL SIGNS: Vitals:   03/15/16 2030 03/15/16 2130  BP: (!) 121/49 (!) 106/52  Pulse: 60 60  Resp: 17 17  Temp:      Constitutional: Alert and oriented. Ill appearing and very pale Eyes: Conjunctivae are pale PERRL. EOMI. Head: Atraumatic. Nose: No congestion/rhinnorhea. Mouth/Throat: Mucous membranes are moist.  Oropharynx non-erythematous. Neck: No stridor. Painless ROM. No cervical spine tenderness to palpation Hematological/Lymphatic/Immunilogical: No cervical lymphadenopathy. Cardiovascular: Normal rate, regular rhythm. Grossly normal heart sounds.  Good peripheral circulation. Respiratory: Normal respiratory effort.  No retractions. Lungs CTAB. Gastrointestinal: Soft and nontender.  Appears appropriate postop. Incisions are clean dry and intact intact. She has frankly positive gross maroon-colored stools that are guaiac positive.  No distention. No abdominal bruits. No CVA tenderness. Musculoskeletal: No lower extremity tenderness nor edema.  No joint effusions. Neurologic:  Normal speech and language. No gross focal neurologic deficits are appreciated. No gait instability. Skin:  Skin is warm, dry and intact. No rash noted. Psychiatric: Mood and affect are normal. Speech and behavior are normal.  ____________________________________________   LABS (all labs ordered are listed, but only abnormal results are displayed)  Results for orders placed or performed during the hospital encounter of 03/15/16 (from the past 24 hour(s))  Basic metabolic panel     Status: Abnormal   Collection Time: 03/15/16  6:07 PM  Result Value Ref Range   Sodium 137 135 - 145 mmol/L   Potassium 4.3 3.5 - 5.1 mmol/L   Chloride 108 101 - 111 mmol/L   CO2 25 22 - 32 mmol/L   Glucose, Bld 167 (H) 65 - 99 mg/dL   BUN 36 (H) 6 - 20 mg/dL   Creatinine, Ser 0.92 0.44 -  1.00 mg/dL   Calcium 8.9 8.9 - 10.3 mg/dL   GFR calc non Af Amer >60 >60 mL/min   GFR calc Af Amer >60 >60 mL/min   Anion gap 4 (L) 5 - 15  CBC     Status: Abnormal   Collection Time: 03/15/16  6:07 PM  Result Value Ref Range   WBC 9.0 3.6 - 11.0 K/uL   RBC 2.98 (L) 3.80 - 5.20 MIL/uL   Hemoglobin 8.2 (L) 12.0 - 16.0 g/dL   HCT 24.2 (L) 35.0 - 47.0 %   MCV 81.1 80.0 - 100.0 fL   MCH 27.6 26.0 - 34.0 pg   MCHC 34.0 32.0 - 36.0 g/dL   RDW 14.1 11.5 - 14.5 %   Platelets 235 150 - 440 K/uL   ____________________________________________ ED ECG REPORT I, Merlyn Lot, the attending physician, personally viewed and interpreted this ECG.   Date: 03/15/2016  EKG Time: 18:02  Rate: 60  Rhythm: sinus  Axis: normal  Intervals:normal intervals  ST&T Change: non specific ST changes likely secondary to demand ischemia in setting of acute anemia  ____________________________________________  RADIOLOGY   ____________________________________________   PROCEDURES  Procedure(s) performed: none    Critical Care performed: yes CRITICAL CARE Performed by: Merlyn Lot   Total critical care time: 50 minutes  Critical care time was exclusive of separately billable procedures and treating other patients.  Critical care was necessary to treat or prevent imminent or life-threatening deterioration.  Critical care was time spent personally by me on the following activities: development of treatment plan with patient and/or surrogate as well as nursing, discussions with consultants, evaluation of patient's response to treatment, examination of patient, obtaining history from patient or surrogate, ordering and performing treatments and interventions, ordering and review of laboratory studies, ordering and review of radiographic studies, pulse oximetry and re-evaluation of patient's condition.  ____________________________________________   INITIAL IMPRESSION / ASSESSMENT AND PLAN  / ED COURSE  Pertinent labs & imaging results that were available during my care of the patient were reviewed by me and considered in my medical decision making (see chart for details).  DDX: Acute blood loss anemia, upper GI bleed, lower GI bleed, coagulopathy, sepsis  Erynne Kealey Jessee is a 60 y.o. who presents to the ED with complaint of fatigue and lightheadedness and appears markedly pale status post GI procedure. Patient arrives afebrile and hemodynamically stable but with evidence of active maroon-colored hematochezia concerning for upper GI bleed given recent anastomosis. Her abdominal  exam is soft and appropriate in the postop setting. Do not feel emergent CT imaging clinically indicated. I do feel patient will require surgical and GI evaluation given an anastomotic bleed is the most likely in this clinical setting.  The patient will be placed on continuous pulse oximetry and telemetry for monitoring.  Laboratory evaluation will be sent to evaluate for the above complaints.     Clinical Course  Comment By Time  Spoke with Dr. love who is on-call for Dr. Duke Salvia  the surgeon  who performed the procedure and he agrees to accept the patient to [facility] for further evaluation of her acute blood loss anemia likely secondary to anastomosis bleed. Patient will be started on Protonix as she does have a history of reflux.  I will transfuse the patient with 2 units of packed red cells due to her acute symptomatic blood loss anemia secondary to have probable active GI bleed. Patient remained hemodynamically stable. Currently awaiting transfer to [facility]. Merlyn Lot, MD 09/22 2048     ____________________________________________   FINAL CLINICAL IMPRESSION(S) / ED DIAGNOSES  Final diagnoses:  Acute blood loss anemia  Gastrointestinal hemorrhage, unspecified gastritis, unspecified gastrointestinal hemorrhage type      NEW MEDICATIONS STARTED DURING THIS VISIT:  New Prescriptions     No medications on file     Note:  This document was prepared using Dragon voice recognition software and may include unintentional dictation errors.    Merlyn Lot, MD 03/15/16 2135    Merlyn Lot, MD 03/15/16 2214

## 2016-03-16 DIAGNOSIS — K449 Diaphragmatic hernia without obstruction or gangrene: Secondary | ICD-10-CM | POA: Diagnosis not present

## 2016-03-16 DIAGNOSIS — R197 Diarrhea, unspecified: Secondary | ICD-10-CM | POA: Diagnosis not present

## 2016-03-16 DIAGNOSIS — Z9889 Other specified postprocedural states: Secondary | ICD-10-CM | POA: Diagnosis not present

## 2016-03-16 DIAGNOSIS — K921 Melena: Secondary | ICD-10-CM | POA: Diagnosis not present

## 2016-03-16 DIAGNOSIS — Z79899 Other long term (current) drug therapy: Secondary | ICD-10-CM | POA: Diagnosis not present

## 2016-03-16 DIAGNOSIS — R1110 Vomiting, unspecified: Secondary | ICD-10-CM | POA: Diagnosis not present

## 2016-03-16 DIAGNOSIS — R531 Weakness: Secondary | ICD-10-CM | POA: Diagnosis not present

## 2016-03-17 DIAGNOSIS — R197 Diarrhea, unspecified: Secondary | ICD-10-CM | POA: Diagnosis not present

## 2016-03-17 DIAGNOSIS — R1110 Vomiting, unspecified: Secondary | ICD-10-CM | POA: Diagnosis not present

## 2016-03-17 DIAGNOSIS — Z79899 Other long term (current) drug therapy: Secondary | ICD-10-CM | POA: Diagnosis not present

## 2016-03-17 DIAGNOSIS — R531 Weakness: Secondary | ICD-10-CM | POA: Diagnosis not present

## 2016-03-17 DIAGNOSIS — Z9889 Other specified postprocedural states: Secondary | ICD-10-CM | POA: Diagnosis not present

## 2016-03-17 DIAGNOSIS — K449 Diaphragmatic hernia without obstruction or gangrene: Secondary | ICD-10-CM | POA: Diagnosis not present

## 2016-03-17 DIAGNOSIS — K921 Melena: Secondary | ICD-10-CM | POA: Diagnosis not present

## 2016-03-18 DIAGNOSIS — R1110 Vomiting, unspecified: Secondary | ICD-10-CM | POA: Diagnosis not present

## 2016-03-18 DIAGNOSIS — K921 Melena: Secondary | ICD-10-CM | POA: Diagnosis not present

## 2016-03-18 DIAGNOSIS — R531 Weakness: Secondary | ICD-10-CM | POA: Diagnosis not present

## 2016-03-18 DIAGNOSIS — R197 Diarrhea, unspecified: Secondary | ICD-10-CM | POA: Diagnosis not present

## 2016-03-18 DIAGNOSIS — K449 Diaphragmatic hernia without obstruction or gangrene: Secondary | ICD-10-CM | POA: Diagnosis not present

## 2016-03-18 DIAGNOSIS — Z9889 Other specified postprocedural states: Secondary | ICD-10-CM | POA: Diagnosis not present

## 2016-03-18 DIAGNOSIS — Z79899 Other long term (current) drug therapy: Secondary | ICD-10-CM | POA: Diagnosis not present

## 2016-03-19 DIAGNOSIS — R531 Weakness: Secondary | ICD-10-CM | POA: Diagnosis not present

## 2016-03-19 DIAGNOSIS — Z79899 Other long term (current) drug therapy: Secondary | ICD-10-CM | POA: Diagnosis not present

## 2016-03-19 DIAGNOSIS — Z9889 Other specified postprocedural states: Secondary | ICD-10-CM | POA: Diagnosis not present

## 2016-03-19 DIAGNOSIS — K921 Melena: Secondary | ICD-10-CM | POA: Diagnosis not present

## 2016-03-19 DIAGNOSIS — R1110 Vomiting, unspecified: Secondary | ICD-10-CM | POA: Diagnosis not present

## 2016-03-19 DIAGNOSIS — R197 Diarrhea, unspecified: Secondary | ICD-10-CM | POA: Diagnosis not present

## 2016-03-19 DIAGNOSIS — K449 Diaphragmatic hernia without obstruction or gangrene: Secondary | ICD-10-CM | POA: Diagnosis not present

## 2016-03-19 LAB — TYPE AND SCREEN
ABO/RH(D): O POS
ANTIBODY SCREEN: NEGATIVE
UNIT DIVISION: 0
UNIT DIVISION: 0

## 2016-03-20 DIAGNOSIS — R1110 Vomiting, unspecified: Secondary | ICD-10-CM | POA: Diagnosis not present

## 2016-03-20 DIAGNOSIS — K449 Diaphragmatic hernia without obstruction or gangrene: Secondary | ICD-10-CM | POA: Diagnosis not present

## 2016-03-20 DIAGNOSIS — Z79899 Other long term (current) drug therapy: Secondary | ICD-10-CM | POA: Diagnosis not present

## 2016-03-20 DIAGNOSIS — K921 Melena: Secondary | ICD-10-CM | POA: Diagnosis not present

## 2016-03-20 DIAGNOSIS — R197 Diarrhea, unspecified: Secondary | ICD-10-CM | POA: Diagnosis not present

## 2016-03-20 DIAGNOSIS — R531 Weakness: Secondary | ICD-10-CM | POA: Diagnosis not present

## 2016-03-20 DIAGNOSIS — Z9889 Other specified postprocedural states: Secondary | ICD-10-CM | POA: Diagnosis not present

## 2016-03-21 DIAGNOSIS — K449 Diaphragmatic hernia without obstruction or gangrene: Secondary | ICD-10-CM | POA: Diagnosis not present

## 2016-03-21 DIAGNOSIS — K921 Melena: Secondary | ICD-10-CM | POA: Diagnosis not present

## 2016-03-21 DIAGNOSIS — R531 Weakness: Secondary | ICD-10-CM | POA: Diagnosis not present

## 2016-03-21 DIAGNOSIS — R197 Diarrhea, unspecified: Secondary | ICD-10-CM | POA: Diagnosis not present

## 2016-03-21 DIAGNOSIS — Z9889 Other specified postprocedural states: Secondary | ICD-10-CM | POA: Diagnosis not present

## 2016-03-21 DIAGNOSIS — R1110 Vomiting, unspecified: Secondary | ICD-10-CM | POA: Diagnosis not present

## 2016-03-21 DIAGNOSIS — Z79899 Other long term (current) drug therapy: Secondary | ICD-10-CM | POA: Diagnosis not present

## 2016-03-22 ENCOUNTER — Ambulatory Visit: Payer: Medicare Other | Admitting: Internal Medicine

## 2016-04-08 ENCOUNTER — Other Ambulatory Visit: Payer: Self-pay | Admitting: Family Medicine

## 2016-04-08 DIAGNOSIS — Z1231 Encounter for screening mammogram for malignant neoplasm of breast: Secondary | ICD-10-CM

## 2016-04-19 ENCOUNTER — Ambulatory Visit: Payer: Medicare Other

## 2016-05-08 ENCOUNTER — Ambulatory Visit
Admission: RE | Admit: 2016-05-08 | Discharge: 2016-05-08 | Disposition: A | Discharge status: Home / Self Care | Payer: Medicare Other | Source: Ambulatory Visit | Attending: Family Medicine | Admitting: Family Medicine

## 2016-05-08 ENCOUNTER — Other Ambulatory Visit: Payer: Self-pay | Admitting: Family Medicine

## 2016-05-08 DIAGNOSIS — Z1231 Encounter for screening mammogram for malignant neoplasm of breast: Secondary | ICD-10-CM

## 2016-05-10 ENCOUNTER — Encounter: Payer: Self-pay | Admitting: Internal Medicine

## 2016-05-14 ENCOUNTER — Other Ambulatory Visit: Payer: Self-pay | Admitting: Internal Medicine

## 2016-05-14 NOTE — Telephone Encounter (Signed)
Pt needs a new glucose meter, pt would like the nano. Pt also needs test strips. Pt uses [facility]. Please advise. Thanks! ep

## 2016-05-20 ENCOUNTER — Other Ambulatory Visit: Payer: Self-pay | Admitting: *Deleted

## 2016-05-21 MED ORDER — ACCU-CHEK NANO SMARTVIEW W/DEVICE KIT: PACK | 0 refills | Status: DC

## 2016-05-21 MED ORDER — GLUCOSE BLOOD VI STRP: ORAL_STRIP | 12 refills | Status: DC

## 2016-05-29 DIAGNOSIS — H25811 Combined forms of age-related cataract, right eye: Secondary | ICD-10-CM | POA: Diagnosis not present

## 2016-06-05 DIAGNOSIS — Z9884 Bariatric surgery status: Secondary | ICD-10-CM | POA: Diagnosis not present

## 2016-06-05 DIAGNOSIS — R195 Other fecal abnormalities: Secondary | ICD-10-CM | POA: Diagnosis not present

## 2016-08-12 ENCOUNTER — Ambulatory Visit: Payer: Medicare Other | Admitting: Family Medicine

## 2016-08-28 DIAGNOSIS — Z9884 Bariatric surgery status: Secondary | ICD-10-CM | POA: Diagnosis not present

## 2016-08-28 DIAGNOSIS — Z5181 Encounter for therapeutic drug level monitoring: Secondary | ICD-10-CM | POA: Diagnosis not present

## 2016-08-28 DIAGNOSIS — K623 Rectal prolapse: Secondary | ICD-10-CM | POA: Diagnosis not present

## 2016-08-28 DIAGNOSIS — R638 Other symptoms and signs concerning food and fluid intake: Secondary | ICD-10-CM | POA: Diagnosis not present

## 2016-08-28 DIAGNOSIS — K912 Postsurgical malabsorption, not elsewhere classified: Secondary | ICD-10-CM | POA: Diagnosis not present

## 2016-09-19 DIAGNOSIS — E119 Type 2 diabetes mellitus without complications: Secondary | ICD-10-CM | POA: Diagnosis not present

## 2016-09-19 DIAGNOSIS — Z713 Dietary counseling and surveillance: Secondary | ICD-10-CM | POA: Diagnosis not present

## 2016-09-26 ENCOUNTER — Other Ambulatory Visit: Payer: Self-pay | Admitting: Pharmacy Technician

## 2016-09-26 NOTE — Patient Outreach (Signed)
At 1 time patient has been D/C on Metformin but is now taking the 1072m Twice daily. Patient had a hospitilization last year as well. Still on Quinapril and Atorvastatin and is not experiencing any barriers at this time. Gave patient my info for future if needed-03/27. Patient also has new Dr. however didn't remember his name.

## 2016-09-27 ENCOUNTER — Telehealth: Payer: Self-pay | Admitting: *Deleted

## 2016-09-27 NOTE — Telephone Encounter (Signed)
Received faxed refill request from [facility] for glucometer, alcohol pads, test strips, lancets, lancet device and control solution. Fax placed in PCP's box for review. Hubbard Hartshorn, RN, BSN

## 2016-10-01 NOTE — Telephone Encounter (Signed)
Completed and placed in the fax box.

## 2016-10-16 ENCOUNTER — Other Ambulatory Visit: Payer: Self-pay | Admitting: *Deleted

## 2016-10-16 NOTE — Telephone Encounter (Signed)
This request is from a different pharmacy.  You can refuse this refill request if you already sent supplies to somewhere else.  Derl Barrow, RN

## 2016-10-16 NOTE — Telephone Encounter (Signed)
Hi Lauren,   I received this refill request for this patient. However, I completed a form for all of her diabetic supplies a few weeks ago. Can you look into this?   Thanks [employer]

## 2016-10-24 NOTE — Progress Notes (Signed)
[facility] Phone: [phone]   Date of Visit: 10/25/2016   HPI:  Here for follow up of chronic medical conditions:   HTN: Reports she has been out of her medications for about 1 month: Norvasc 20m, Metoprolol, Quinapril. - no HA, blurred vision, chest pain, sob - does not check BP at home - report that one of her specialty doctors did lab work a few days ago. She asked them to fax the results to our clinic.   DM2:   - reports she has not been checking her cbgs lately because she has been out of strips - has been off Metformin since 09/2015.  - no polyuria or polydypsia  - reports that she has not been following a healthy diet as she did before - reports she had been out of neurontin for a while and then found a bottle so she restarted at 9052mTID which made her drowsy so she stopped.  Anemia:  Patient reports that she had blood work done at another provider's office and they are to fax [place] results. Reports her vit D level is low and they recommended taking Vit D 10,000 IU daily. She also reports that her Vit B12 was low and her iron was low. No bloody or dark tarry stools. No blood in urine.   Medication Management: - reviewed medications. She has been out of all medications (as noted above) - reports she had been out of effexor. She is not sure why she took this medication. Per chart review, this medication was for depression and for neuropathy. She reports that it did not seem to help. Reports she has had some new stressors in her life recently. Her husband was diagnosed with MS in 2017 and his health has deteriorated quickly. One of her good friends is living with her and has been a great support system. Patient denies SI/HI. We did not have time to further delve into discussion of her mood.   ROS: See HPI.  [place] Heights[place]H: HTN, Venous Insufficiency, Nonobstructive CAD, OSA, DM2 with neuropathy, Hypercholesterolemia CKD II Multinodular goiter (last  TSH 3.26; [place] tissue neck in 09/2004 showed multinodular goiter with specific small nodule for which was recommended to be followed by repeat US[place]orean 3-6 months Anemia S/p Bariatric surgery 2015: has chronic diarrhea Vit D Deficiency: last checked 10/2015 17 (from 18.3) History of tobacco use: 1ppd/day for 24 years; no longer smoking History of Colonic Tubular Adenoma: Last colonoscopy in 10/2015 with 3 biopsies with tubular adenoma; GI recommends follow up colonoscopy in 3 years.  Psoriasis Depression  PHYSICAL EXAM: BP 138/70   Pulse 68   Temp 98.4 F (36.9 C) (Oral)  GEN: NAD HEENT: neck supple, EOMI, sclera clear  CV: RRR, no murmurs, rubs, or gallops PULM: CTAB, normal effort SKIN: No rash or cyanosis; warm and well-perfused EXTR: No lower extremity edema or calf tenderness PSYCH: Mood and affect euthymic, normal rate and volume of speech NEURO: Awake, alert, no focal deficits grossly, normal speech   ASSESSMENT/PLAN:  Type 2 diabetes, controlled, with neuropathy A1c up tp 7.5 from prior normal. Likely due issues with adherence to a good diet. Discussed diet changes prior to restarting Metformin. Patient due to eye exam. Refilled strips so that patient is able to check cbgs. Follow up in 3 months for DM  Essential hypertension BP is below her goal for age. Will restart Metoprolol but at 12.34m38maily as her HR is already in the upper 60s.  She needs beta blocker due to history of CAD. Additionally will start low dose ACE for kidney protection in the setting of Diabetes.   Medication Management: - will hold off restarting Effexor or another agent for Depression. We did not have enough time to delve into her mood. She denies SI/HI. Follow up in 3-4 weeks for this.   Will also look for fax of her blood work from other provider. If I do not receive this, then she will need CBC, CMP, Lipid, and Vitamin D.    Smiley Houseman, MD PGY [facility]

## 2016-10-25 ENCOUNTER — Ambulatory Visit (INDEPENDENT_AMBULATORY_CARE_PROVIDER_SITE_OTHER): Payer: Medicare Other | Admitting: Internal Medicine

## 2016-10-25 ENCOUNTER — Encounter: Payer: Self-pay | Admitting: Internal Medicine

## 2016-10-25 VITALS — BP 138/70 | HR 68 | Temp 98.4°F

## 2016-10-25 DIAGNOSIS — E7800 Pure hypercholesterolemia, unspecified: Secondary | ICD-10-CM

## 2016-10-25 DIAGNOSIS — E1140 Type 2 diabetes mellitus with diabetic neuropathy, unspecified: Secondary | ICD-10-CM

## 2016-10-25 DIAGNOSIS — I10 Essential (primary) hypertension: Secondary | ICD-10-CM

## 2016-10-25 DIAGNOSIS — Z79899 Other long term (current) drug therapy: Secondary | ICD-10-CM | POA: Diagnosis not present

## 2016-10-25 LAB — POCT GLYCOSYLATED HEMOGLOBIN (HGB A1C): Hemoglobin A1C: 7.5

## 2016-10-25 MED ORDER — GLUCOSE BLOOD VI STRP: ORAL_STRIP | 12 refills | Status: DC

## 2016-10-25 MED ORDER — VITAMIN D3 250 MCG (10000 UT) PO TABS: 1.0000 | ORAL_TABLET | Freq: Every day | ORAL | 0 refills | Status: DC

## 2016-10-25 MED ORDER — ATORVASTATIN CALCIUM 40 MG PO TABS: 40.0000 mg | ORAL_TABLET | Freq: Every day | ORAL | 0 refills | Status: DC

## 2016-10-25 MED ORDER — GABAPENTIN 100 MG PO CAPS: 100.0000 mg | ORAL_CAPSULE | Freq: Three times a day (TID) | ORAL | 0 refills | Status: DC

## 2016-10-25 MED ORDER — ASPIRIN 81 MG PO TABS: 81.0000 mg | ORAL_TABLET | Freq: Every day | ORAL | 0 refills | Status: DC

## 2016-10-25 MED ORDER — QUINAPRIL HCL 5 MG PO TABS: 5.0000 mg | ORAL_TABLET | Freq: Every day | ORAL | 0 refills | Status: DC

## 2016-10-25 MED ORDER — METOPROLOL SUCCINATE ER 25 MG PO TB24: 12.5000 mg | ORAL_TABLET | Freq: Every day | ORAL | 0 refills | Status: DC

## 2016-10-25 NOTE — Assessment & Plan Note (Signed)
BP is below her goal for age. Will restart Metoprolol but at 12.20m daily as her HR is already in the upper 60s. She needs beta blocker due to history of CAD. Additionally will start low dose ACE for kidney protection in the setting of Diabetes.

## 2016-10-25 NOTE — Patient Instructions (Addendum)
  Diet Recommendations for Diabetes   Starchy (carb) foods include: Bread, rice, pasta, potatoes, corn, crackers, bagels, muffins, all baked goods.  (Fruits, milk, and yogurt also have carbohydrate, but most of these foods will not spike your blood sugar as the starchy foods will.)  A few fruits do cause high blood sugars; use small portions of bananas (limit to 1/2 at a time), grapes, and most tropical fruits.    Protein foods include: Meat, fish, poultry, eggs, dairy foods, and beans such as pinto and kidney beans (beans also provide carbohydrate).   1. Eat at least 3 meals and 1-2 snacks per day. Never go more than 4-5 hours while awake without eating.  2. Limit starchy foods to TWO per meal and ONE per snack. ONE portion of a starchy  food is equal to the following:   - ONE slice of bread (or its equivalent, such as half of a hamburger bun).   - 1/2 cup of a "scoopable" starchy food such as potatoes or rice.   - 15 grams of carbohydrate as shown on food label.  3. Both lunch and dinner should include a protein food, a carb food, and vegetables.   - Obtain twice as many veg's as protein or carbohydrate foods for both lunch and dinner.   - Fresh or frozen veg's are best.   - Try to keep frozen veg's on hand for a quick vegetable serving.    4. Breakfast should always include protein.     Please make an appointment in 1 month. For PAP and mood.

## 2016-10-25 NOTE — Assessment & Plan Note (Signed)
A1c up tp 7.5 from prior normal. Likely due issues with adherence to a good diet. Discussed diet changes prior to restarting Metformin. Patient due to eye exam. Refilled strips so that patient is able to check cbgs. Follow up in 3 months for DM

## 2016-11-21 NOTE — Progress Notes (Signed)
[facility] Phone: [phone]   Date of Visit: 11/22/2016   HPI:  Patient here for pap smear and to discuss mood.   Last pap in our chart was in 12/2008 with normal cytology.   S/p Bariatric Surgery 2015 with revision in 2017 - [facility]. Lab results will be placed in chart. Briefly, patient has iron deficiency anemia, Vitamin B12 deficiency, Vitamin D deficiency. Unable to see CBC, Vit K, Vit D, Copper  - patient reports she was started on Vitamin D and Vitamin B12 supplements - reports that she was on Vit B12 injections in the past. Was told to take PO tabs after recent lab results.  - reports the specialist referred her to hematology due to her anemia. She is taking iron supplements.   History of Multinodular Goiter:  - noted on US soft tissue neck in 09/2014 showed multinoduler goiter with a small nodule inferiorly in the right lobe likely benign. The stability of node can be addressed by performing follow up thyroid US in 3-6 months. Patient does not recall repeat ultrasound.  - TSH checked in March normal  Depression/Stress/Anxiety:  - main factor she believes is contributing is her husband's worsening health. He has MS and she is his main caregiver. Her husband's mood has also been down due to his worsening health.  - she feels that she is irritable towards her children  - reports of feeling down, some anhedonia, sleep disturbances (difficulty falling asleep but also taking naps during the day), tired/decreased energy. Denies SI/HI  - reports of racing thoughts of all the responsibilities she has had. Denies grandiose beliefs, inappropriate euphoria, markedly increased energy.  - reports she cries at times due to the stress - has not seen behavioral therapy - was on Venlafaxine in the past but did not see much difference. Last took over 6 months ago. Would like to try another medication.   ROS: See HPI.  [place]:  PMH:   HTN, Venous Insufficiency, Nonobstructive CAD, OSA, DM2 with peripheral neuropathy, Hypercholesterolemia CKD II Multinodular goiter Anemia S/p Bariatric surgery 2015 with revision Sept 2017 Vit D Deficiency, Vit B12 Deficiency, Iron deficiency Anemia  History of tobacco use: 1ppd/day for 24 years; no longer smoking History of Colonic Tubular Adenoma: Last colonoscopy in 10/2015 with 3 biopsies with tubular adenoma; GI recommends follow up colonoscopy in 3 years.   PHYSICAL EXAM: BP 122/64   Pulse 64   Temp 98.4 F (36.9 C) (Oral)   Wt 219 lb (99.3 kg)   SpO2 96%   BMI 34.30 kg/m  GEN: NAD HEENT:neck supple, no thyroid nodules noted on exam, EOMI, sclera clear  CV: RRR, no murmurs, rubs, or gallops PULM: CTAB, normal effort PSYCH: Mood and affect euthymic, normal rate and volume of speech NEURO: Awake, alert, no focal deficits grossly, normal speech  ASSESSMENT/PLAN:  Health maintenance:  - Pap today  - will need to discuss indication for Zostervax at next visit   Chronic depression PHQ9 score of 7 (somewhat difficult) and GAD7 score of 3. No SI/HI. Would like to start a different antidepressant. Will start Zoloft 41m daily for 1 week, then increase to 558mdaily. Interested in speaking with behavioral health. Contact information given. Follow up in 4 weeks (or sooner if needed).   S/P bariatric surgery-duodenal switch with sleeve gastrectomy With Vitamin B12 and Vitamin D deficiency, as well as iron deficiency anemia. Patient started supplements in late may. Plan to repeat labs in 3-4  months to monitor.   GOITER, MULTINODULAR Exam is unremarkable today. TSH in March normal. Will order Thyroid US to monitor nodule noted on imaging in 2006 per radiology recommendations.   Smiley Houseman, MD PGY [facility]

## 2016-11-22 ENCOUNTER — Other Ambulatory Visit (HOSPITAL_COMMUNITY)
Admission: RE | Admit: 2016-11-22 | Discharge: 2016-11-22 | Disposition: A | Discharge status: Home / Self Care | Payer: Medicare Other | Source: Ambulatory Visit | Attending: Family Medicine | Admitting: Family Medicine

## 2016-11-22 ENCOUNTER — Encounter: Payer: Self-pay | Admitting: Internal Medicine

## 2016-11-22 ENCOUNTER — Ambulatory Visit (INDEPENDENT_AMBULATORY_CARE_PROVIDER_SITE_OTHER): Payer: Medicare Other | Admitting: Internal Medicine

## 2016-11-22 VITALS — BP 122/64 | HR 64 | Temp 98.4°F | Wt 219.0 lb

## 2016-11-22 DIAGNOSIS — F329 Major depressive disorder, single episode, unspecified: Secondary | ICD-10-CM | POA: Diagnosis not present

## 2016-11-22 DIAGNOSIS — Z9884 Bariatric surgery status: Secondary | ICD-10-CM | POA: Diagnosis not present

## 2016-11-22 DIAGNOSIS — Z124 Encounter for screening for malignant neoplasm of cervix: Secondary | ICD-10-CM

## 2016-11-22 DIAGNOSIS — E042 Nontoxic multinodular goiter: Secondary | ICD-10-CM | POA: Diagnosis not present

## 2016-11-22 DIAGNOSIS — F32A Depression, unspecified: Secondary | ICD-10-CM

## 2016-11-22 LAB — LIPID PANEL
CHOL/HDL RATIO, SERUM: 2.52 (ref 0–5.0)
CHOLESTEROL, TOTAL: 189 mg/dL (ref 125–200)
HDL Cholesterol: 75 mg/dL (ref >46)
LDL Cholesterol: 101 mg/dL (ref <129)
NON-HDL CHOLESTEROL (CALC): 114 (ref <159)
Triglycerides: 63 mg/dL (ref <149)

## 2016-11-22 LAB — TSH: TSH: 2.5 u[IU]/mL (ref 0.4–3.33)

## 2016-11-22 MED ORDER — SERTRALINE HCL 25 MG PO TABS: ORAL_TABLET | ORAL | 0 refills | Status: DC

## 2016-11-22 NOTE — Assessment & Plan Note (Signed)
PHQ9 score of 7 (somewhat difficult) and GAD7 score of 3. No SI/HI. Would like to start a different antidepressant. Will start Zoloft 51m daily for 1 week, then increase to 53mdaily. Interested in speaking with behavioral health. Contact information given. Follow up in 4 weeks (or sooner if needed).

## 2016-11-22 NOTE — Assessment & Plan Note (Signed)
With Vitamin B12 and Vitamin D deficiency, as well as iron deficiency anemia. Patient started supplements in late may. Plan to repeat labs in 3-4 months to monitor.

## 2016-11-22 NOTE — Patient Instructions (Addendum)
Let's start you on Sertraline. Take one tablet a day for 1 week, then two tablets a day (45m).   Follow up in 4 weeks for mood.   We made an appointment for your thyroid ultrasound

## 2016-11-22 NOTE — Assessment & Plan Note (Signed)
Exam is unremarkable today. TSH in March normal. Will order Thyroid US to monitor nodule noted on imaging in 2006 per radiology recommendations.

## 2016-11-26 ENCOUNTER — Encounter: Payer: Self-pay | Admitting: Internal Medicine

## 2016-11-26 LAB — CYTOLOGY - PAP
DIAGNOSIS: NEGATIVE
HPV (WINDOPATH): NOT DETECTED

## 2016-11-26 NOTE — Progress Notes (Signed)
Sent letter to inform patient of normal pap results.

## 2016-11-28 ENCOUNTER — Encounter: Payer: Self-pay | Admitting: Bariatrics

## 2016-11-29 ENCOUNTER — Ambulatory Visit (HOSPITAL_COMMUNITY): Admission: RE | Admit: 2016-11-29 | Payer: Medicare Other | Source: Ambulatory Visit

## 2016-12-04 ENCOUNTER — Ambulatory Visit: Payer: Medicare Other | Admitting: Oncology

## 2016-12-10 NOTE — Progress Notes (Signed)
[place]  [phone] [phone]  ID: MEDIA PIZZINI OB: [birth year]  MR#: [id number]  QQP#:[id number]  Patient Care Team: Smiley Houseman, MD as PCP - General (Family Medicine) Kennith Center, RD as Dietitian (Family Medicine) Macarthur Critchley, [place] as Referring Physician (Optometry) Duke Salvia Venia Carbon, MD as Attending Physician [facility])  CHIEF COMPLAINT: Iron deficiency anemia.  INTERVAL HISTORY: Patient is a 61 year old female with history of adjuvant bypass surgery was noted to have a declining hemoglobin on routine blood work. She currently feels weak and fatigued, but otherwise feels well. She has no neurologic complaints. She denies any recent fevers or illnesses. She has a good appetite and denies weight loss. She denies any chest pain or shortness of breath. She denies any nausea, vomiting, constipation, diarrhea. She has no urinary complaint. Patient otherwise feels well but offers no further specific complaints.  REVIEW OF SYSTEMS:   Review of Systems  Constitutional: Positive for malaise/fatigue. Negative for fever and weight loss.  Respiratory: Negative.  Negative for cough and shortness of breath.   Cardiovascular: Negative.  Negative for chest pain and leg swelling.  Gastrointestinal: Negative.  Negative for abdominal pain, blood in stool and melena.  Genitourinary: Negative.   Musculoskeletal: Negative.   Skin: Negative.  Negative for rash.  Neurological: Positive for weakness.  Psychiatric/Behavioral: Negative.  The patient is not nervous/anxious.     As per HPI. Otherwise, a complete review of systems is negative.  PAST MEDICAL HISTORY: Past Medical History:  Diagnosis Date  . Allergy   . ANEMIA, PERNICIOUS, HX OF 05/13/2007  . Anxiety   . Arthritis   . ASYMPTOMATIC POSTMENOPAUSAL STATUS 02/18/2008  . BACK PAIN, LUMBAR 11/19/2007  . Bowel incontinence   . Cancer ([place])    pre canceer polyps colon  . Cataract   .  Cellulitis of left lower leg 10/28/2013  . Cellulitis of leg, right 10/11/2010  . DEPRESSION, CHRONIC 10/06/2007  . DIABETES MELLITUS, WITH NEUROLOGICAL COMPLICATIONS 01/03/4579  . DYSLIPIDEMIA 05/13/2007  . Fecal soiling 06/28/2014  . Frozen shoulder    Lt  . GOITER, MULTINODULAR 05/13/2007  . Hx of colonic polyps 12/04/2010  . HYPERCHOLESTEROLEMIA 03/20/2010  . HYPERTENSION 03/20/2010  . Lower back pain   . NASH (nonalcoholic steatohepatitis)   . OBSTRUCTIVE SLEEP APNEA 06/12/2007   uses CPAP...has not used for 2.5 years  . PERIPHERAL NEUROPATHY 01/12/2009  . PSORIASIS 05/28/2010  . Sleep apnea   . UNSPECIFIED VENOUS INSUFFICIENCY 05/28/2010    PAST SURGICAL HISTORY: Past Surgical History:  Procedure Laterality Date  . COLONOSCOPY    . ELECTROCARDIOGRAM  04/16/2006  . EXAMINATION UNDER ANESTHESIA N/A 06/29/2014   Procedure: EXAM UNDER ANESTHESIA;  Surgeon: Janyth Contes, MD;  Location: [facility];  Service: Gynecology;  Laterality: N/A;  . Exercise myoview  01/24/2005  . FLEXIBLE SIGMOIDOSCOPY    . GASTRIC BYPASS  11/09/2013  . GASTRIC BYPASS    . KNEE ARTHROSCOPY  2003   right  . LAPAROSCOPY N/A 03/12/2016   Procedure: LAPAROSCOPIC ANASTOMOSIS OF INTESTINE (ENTEROENTEROSTOMY);  Surgeon: Ladora Daniel, MD;  Location: [facility];  Service: General;  Laterality: N/A;  . LEG SURGERY     metal and pins in lower left leg  . mrsa Right    arm  . ROTATOR CUFF REPAIR  2008   right  . SHOULDER ARTHROSCOPY W/ ROTATOR CUFF REPAIR Right   . TONSILLECTOMY    . VARICOSE VEIN SURGERY     [place]    FAMILY  HISTORY: Family History  Problem Relation Age of Onset  . Hyperlipidemia Father   . Hypertension Father   . Cirrhosis Father   . Colon cancer Father 69  . Colon cancer Sister 32  . Aneurysm Mother   . Cerebral aneurysm Mother   . Colon cancer Sister   . Drug abuse Neg Hx   . CAD Neg Hx   . Stomach cancer Neg Hx     ADVANCED DIRECTIVES (Y/N):  N  HEALTH MAINTENANCE: Social  History  Substance Use Topics  . Smoking status: Former Smoker    Packs/day: 2.00    Years: 24.00    Types: Cigarettes    Start date: 06/24/1970    Quit date: 08/06/1994  . Smokeless tobacco: Never Used  . Alcohol use No     Colonoscopy:  PAP:  Bone density:  Lipid panel:  Allergies  Allergen Reactions  . Keflex [Cephalexin] Hives    Denies Airway involvement  . Adhesive [Tape] Rash    Latex  . Latex Rash  . Neosporin [Neomycin-Bacitracin Zn-Polymyx] Rash    Current Outpatient Prescriptions  Medication Sig Dispense Refill  . aspirin 81 MG tablet Take 1 tablet (81 mg total) by mouth daily. 90 tablet 0  . atorvastatin (LIPITOR) 40 MG tablet Take 1 tablet (40 mg total) by mouth daily. 90 tablet 0  . Blood Glucose Monitoring Suppl (ACCU-CHEK AVIVA PLUS) W/DEVICE KIT Please provide 1 device 1 kit 0  . Blood Glucose Monitoring Suppl (ACCU-CHEK NANO SMARTVIEW) w/Device KIT Use to check blood glucose four times a day 1 kit 0  . Cholecalciferol (VITAMIN D3) 10000 units TABS Take 1 capsule by mouth daily. 90 tablet 0  . gabapentin (NEURONTIN) 100 MG capsule Take 1 capsule (100 mg total) by mouth 3 (three) times daily. 270 capsule 0  . glucose blood (ACCU-CHEK SMARTVIEW) test strip 1 each by Other route 4 (four) times daily. Use as instructed to check sugar 100 each 12  . glucose blood (ACCU-CHEK SMARTVIEW) test strip CHECK BLOOD SUGARS 4 TIMES DAILY 120 each 12  . metoprolol succinate (TOPROL-XL) 25 MG 24 hr tablet Take 0.5 tablets (12.5 mg total) by mouth daily. 90 tablet 0  . omeprazole (PRILOSEC) 20 MG capsule Take 20 mg by mouth 2 (two) times daily before a meal.    . quinapril (ACCUPRIL) 5 MG tablet Take 1 tablet (5 mg total) by mouth daily. 90 tablet 0  . sertraline (ZOLOFT) 25 MG tablet Take 1 tablet daily for 1 week, then increase to 2 tablets daily. 60 tablet 0  . Teduglutide, rDNA, (GATTEX) 5 MG KIT Inject 5 mg into the skin daily.     No current facility-administered  medications for this visit.     OBJECTIVE: Vitals:   12/11/16 1113  BP: 131/78  Pulse: (!) 53  Resp: 20  Temp: (!) 96.6 F (35.9 C)     Body mass index is 33.63 kg/m.    ECOG FS:1 - Symptomatic but completely ambulatory  General: Well-developed, well-nourished, no acute distress. Eyes: Pink conjunctiva, anicteric sclera. HEENT: Normocephalic, moist mucous membranes, clear oropharnyx. Lungs: Clear to auscultation bilaterally. Heart: Regular rate and rhythm. No rubs, murmurs, or gallops. Abdomen: Soft, nontender, nondistended. No organomegaly noted, normoactive bowel sounds. Musculoskeletal: No edema, cyanosis, or clubbing. Neuro: Alert, answering all questions appropriately. Cranial nerves grossly intact. Skin: No rashes or petechiae noted. Psych: Normal affect. Lymphatics: No cervical, calvicular, axillary or inguinal LAD.   LAB RESULTS:  Lab Results  Component Value  Date   NA 137 03/15/2016   K 4.3 03/15/2016   CL 108 03/15/2016   CO2 25 03/15/2016   GLUCOSE 167 (H) 03/15/2016   BUN 36 (H) 03/15/2016   CREATININE 0.92 03/15/2016   CALCIUM 8.9 03/15/2016   PROT 6.6 03/13/2016   ALBUMIN 3.6 03/13/2016   AST 49 (H) 03/13/2016   ALT 37 03/13/2016   ALKPHOS 108 03/13/2016   BILITOT 0.5 03/13/2016   GFRNONAA >60 03/15/2016   GFRAA >60 03/15/2016    Lab Results  Component Value Date   WBC 5.5 12/11/2016   NEUTROABS 7.9 (H) 03/13/2016   HGB 11.1 (L) 12/11/2016   HCT 34.2 (L) 12/11/2016   MCV 71.7 (L) 12/11/2016   PLT 233 12/11/2016   Lab Results  Component Value Date   IRON 27 (L) 12/11/2016   TIBC 434 12/11/2016   IRONPCTSAT 6 (L) 12/11/2016   Lab Results  Component Value Date   FERRITIN 20 12/11/2016     STUDIES: No results found.  ASSESSMENT: Iron deficiency anemia  PLAN:    1. Iron deficiency anemia: Patient's hemoglobin is mildly decreased, but she does have significantly decreased iron stores. She also was noted to have a decreased vitamin  B-12 level. The remainder of her laboratory work was either negative or within normal limits. Patient will return to clinic in 1 and 2 weeks for 510 mg of IV Feraheme. She will also receive 1000 g intramuscular B-12. Patient will then return to clinic in 3 months with repeat laboratory work and further evaluation.  Approximately 45 minutes was spent in discussion of which greater than 50% was consultation.   Patient expressed understanding and was in agreement with this plan. She also understands that She can call clinic at any time with any questions, concerns, or complaints.    Lloyd Huger, MD   12/13/2016 4:39 PM

## 2016-12-11 ENCOUNTER — Inpatient Hospital Stay: Payer: Medicare Other | Attending: Oncology | Admitting: Oncology

## 2016-12-11 ENCOUNTER — Inpatient Hospital Stay: Payer: Medicare Other

## 2016-12-11 VITALS — BP 131/78 | HR 53 | Temp 96.6°F | Resp 20 | Wt 214.7 lb

## 2016-12-11 DIAGNOSIS — G4733 Obstructive sleep apnea (adult) (pediatric): Secondary | ICD-10-CM | POA: Diagnosis not present

## 2016-12-11 DIAGNOSIS — G629 Polyneuropathy, unspecified: Secondary | ICD-10-CM | POA: Diagnosis not present

## 2016-12-11 DIAGNOSIS — Z7982 Long term (current) use of aspirin: Secondary | ICD-10-CM | POA: Insufficient documentation

## 2016-12-11 DIAGNOSIS — Z79899 Other long term (current) drug therapy: Secondary | ICD-10-CM | POA: Insufficient documentation

## 2016-12-11 DIAGNOSIS — F329 Major depressive disorder, single episode, unspecified: Secondary | ICD-10-CM | POA: Insufficient documentation

## 2016-12-11 DIAGNOSIS — I872 Venous insufficiency (chronic) (peripheral): Secondary | ICD-10-CM | POA: Diagnosis not present

## 2016-12-11 DIAGNOSIS — Z87891 Personal history of nicotine dependence: Secondary | ICD-10-CM | POA: Diagnosis not present

## 2016-12-11 DIAGNOSIS — E7800 Pure hypercholesterolemia, unspecified: Secondary | ICD-10-CM | POA: Diagnosis not present

## 2016-12-11 DIAGNOSIS — E538 Deficiency of other specified B group vitamins: Secondary | ICD-10-CM | POA: Insufficient documentation

## 2016-12-11 DIAGNOSIS — I10 Essential (primary) hypertension: Secondary | ICD-10-CM | POA: Diagnosis not present

## 2016-12-11 DIAGNOSIS — F419 Anxiety disorder, unspecified: Secondary | ICD-10-CM | POA: Insufficient documentation

## 2016-12-11 DIAGNOSIS — D509 Iron deficiency anemia, unspecified: Secondary | ICD-10-CM | POA: Diagnosis not present

## 2016-12-11 DIAGNOSIS — L409 Psoriasis, unspecified: Secondary | ICD-10-CM | POA: Diagnosis not present

## 2016-12-11 DIAGNOSIS — Z9884 Bariatric surgery status: Secondary | ICD-10-CM | POA: Insufficient documentation

## 2016-12-11 DIAGNOSIS — E119 Type 2 diabetes mellitus without complications: Secondary | ICD-10-CM | POA: Diagnosis not present

## 2016-12-11 LAB — FERRITIN: Ferritin: 20 ng/mL (ref 11–307)

## 2016-12-11 LAB — CBC
HEMATOCRIT: 34.2 % — AB (ref 35.0–47.0)
Hemoglobin: 11.1 g/dL — ABNORMAL LOW (ref 12.0–16.0)
MCH: 23.3 pg — ABNORMAL LOW (ref 26.0–34.0)
MCHC: 32.5 g/dL (ref 32.0–36.0)
MCV: 71.7 fL — ABNORMAL LOW (ref 80.0–100.0)
Platelets: 233 10*3/uL (ref 150–440)
RBC: 4.77 MIL/uL (ref 3.80–5.20)
RDW: 16.9 % — AB (ref 11.5–14.5)
WBC: 5.5 10*3/uL (ref 3.6–11.0)

## 2016-12-11 LAB — VITAMIN B12: VITAMIN B 12: 163 pg/mL — AB (ref 180–914)

## 2016-12-11 LAB — IRON AND TIBC
Iron: 27 ug/dL — ABNORMAL LOW (ref 28–170)
SATURATION RATIOS: 6 % — AB (ref 10.4–31.8)
TIBC: 434 ug/dL (ref 250–450)
UIBC: 407 ug/dL

## 2016-12-11 LAB — RETICULOCYTES
RBC.: 4.87 MIL/uL (ref 3.80–5.20)
Retic Count, Absolute: 77.9 10*3/uL (ref 19.0–183.0)
Retic Ct Pct: 1.6 % (ref 0.4–3.1)

## 2016-12-11 LAB — LACTATE DEHYDROGENASE: LDH: 144 U/L (ref 98–192)

## 2016-12-11 LAB — DAT, POLYSPECIFIC AHG (ARMC ONLY): Polyspecific AHG test: NEGATIVE

## 2016-12-11 LAB — FOLATE: FOLATE: 8.2 ng/mL (ref >5.9)

## 2016-12-11 NOTE — Progress Notes (Signed)
Patient here today for new evaluation regarding anemia.

## 2016-12-12 LAB — HAPTOGLOBIN: Haptoglobin: 147 mg/dL (ref 34–200)

## 2016-12-13 DIAGNOSIS — E538 Deficiency of other specified B group vitamins: Secondary | ICD-10-CM | POA: Insufficient documentation

## 2016-12-13 HISTORY — DX: Deficiency of other specified B group vitamins: E53.8

## 2016-12-20 ENCOUNTER — Inpatient Hospital Stay: Payer: Medicare Other

## 2016-12-20 VITALS — BP 143/55 | HR 60 | Resp 20

## 2016-12-20 DIAGNOSIS — E7800 Pure hypercholesterolemia, unspecified: Secondary | ICD-10-CM | POA: Diagnosis not present

## 2016-12-20 DIAGNOSIS — G4733 Obstructive sleep apnea (adult) (pediatric): Secondary | ICD-10-CM | POA: Diagnosis not present

## 2016-12-20 DIAGNOSIS — Z9884 Bariatric surgery status: Secondary | ICD-10-CM | POA: Diagnosis not present

## 2016-12-20 DIAGNOSIS — Z87891 Personal history of nicotine dependence: Secondary | ICD-10-CM | POA: Diagnosis not present

## 2016-12-20 DIAGNOSIS — E538 Deficiency of other specified B group vitamins: Secondary | ICD-10-CM | POA: Diagnosis not present

## 2016-12-20 DIAGNOSIS — G629 Polyneuropathy, unspecified: Secondary | ICD-10-CM | POA: Diagnosis not present

## 2016-12-20 DIAGNOSIS — E119 Type 2 diabetes mellitus without complications: Secondary | ICD-10-CM | POA: Diagnosis not present

## 2016-12-20 DIAGNOSIS — D509 Iron deficiency anemia, unspecified: Secondary | ICD-10-CM | POA: Diagnosis not present

## 2016-12-20 DIAGNOSIS — Z79899 Other long term (current) drug therapy: Secondary | ICD-10-CM | POA: Diagnosis not present

## 2016-12-20 DIAGNOSIS — I10 Essential (primary) hypertension: Secondary | ICD-10-CM | POA: Diagnosis not present

## 2016-12-20 DIAGNOSIS — Z7982 Long term (current) use of aspirin: Secondary | ICD-10-CM | POA: Diagnosis not present

## 2016-12-20 DIAGNOSIS — I872 Venous insufficiency (chronic) (peripheral): Secondary | ICD-10-CM | POA: Diagnosis not present

## 2016-12-20 DIAGNOSIS — L409 Psoriasis, unspecified: Secondary | ICD-10-CM | POA: Diagnosis not present

## 2016-12-20 MED ORDER — SODIUM CHLORIDE 0.9 % IV SOLN
510.0000 mg | Freq: Once | INTRAVENOUS | Status: AC
  Administered 2016-12-20: 510 mg via INTRAVENOUS
  Filled 2016-12-20: qty 17

## 2016-12-20 MED ORDER — SODIUM CHLORIDE 0.9 % IV SOLN
Freq: Once | INTRAVENOUS | Status: AC
  Administered 2016-12-20: 14:00:00 via INTRAVENOUS
  Filled 2016-12-20: qty 1000

## 2016-12-20 MED ORDER — CYANOCOBALAMIN 1000 MCG/ML IJ SOLN
1000.0000 ug | Freq: Once | INTRAMUSCULAR | Status: AC
  Administered 2016-12-20: 1000 ug via INTRAMUSCULAR

## 2016-12-23 ENCOUNTER — Other Ambulatory Visit: Payer: Self-pay | Admitting: *Deleted

## 2016-12-23 MED ORDER — SERTRALINE HCL 25 MG PO TABS: 50.0000 mg | ORAL_TABLET | Freq: Every day | ORAL | 0 refills | Status: DC

## 2016-12-25 NOTE — Progress Notes (Signed)
[facility] Phone: [phone]   Date of Visit: 12/26/2016   HPI:  Patient is here for follow up for mood. She also reports of lesion on her right buttock/thigh area.   Depression/Stress/Anxiety: - as mentioned before, the main factor contributing to this is her husbands worsening health. Her husband's mood has also been down which is not helping his relationship with her.  - she denies SI/HI - reports that taking to someone about her feelings is what seems to help the most. Her friend to lives with her and her husband is a good support system. Her daughter is also supportive but she does not want to "burden her". - she is not sure if Zoloft is helping. He is taking 62m but taking 1 tablet (273m BID. She wonders if taking it together at the same time would make a difference.  - PHQ9-3, somewhat difficult  Sore on right Buttock: - patient noticed sore on the right buttock/thigh region several days ago while she driving home from vacation - she reports the area is painful - she denies drainage from the area - she denies fevers, chills, nausea, vomiting, abdominal pain, diarrhea - has not tried anything for this - no past history of similar symptoms  ROS: See HPI.  [place] PMH: HTN DM2 HLD CKD2 S/p gastric bypass, Vitamin B12 Deficiency, Fe Deficiency anemia, Vitamin D Deficiency Nonobstructive CAD Venous Insufficiency Peripheral Neuropathy OSA Goiter (multinodular) OA Psoriasis Depression Low Back Pain  PHYSICAL EXAM: BP (!) 142/62   Pulse 63   Temp 98.7 F (37.1 C) (Oral)   Wt 218 lb (98.9 kg)   SpO2 98%   BMI 34.14 kg/m  GEN: NAD, pleasant  CV: RRR, Pulse 63, no murmurs, rubs, or gallops PULM: CTAB, normal effort SKIN: Right Buttock/thing region: 2.5 by 1.5cm area of what looks like a stage 2 pressure wound with surrounding erythema. There is no induration or signs of abscess but she is tender to touch there. Mild increased warmth  in that area.; warm and well-perfused EXTR: No lower extremity edema or calf tenderness, varicose veins RLE >LLE PSYCH: Mood and affect euthymic, normal rate and volume of speech NEURO: Awake, alert, no focal deficits grossly, normal speech  Diabetic Foot Exam - Simple   Simple Foot Form Diabetic Foot exam was performed with the following findings:  Yes 12/26/2016 10:10 AM  Visual Inspection See comments:  Yes Sensation Testing See comments:  Yes Pulse Check Posterior Tibialis and Dorsalis pulse intact bilaterally:  Yes Comments Varicose veins noted RLE > LLE. Some oncychomycosis of the toe nails noted. No ulcers. Decreased senation to monofilament testing (only able to sense on the arch of the feet bilaterally)      ASSESSMENT/PLAN: Depression: PHQ9 has improved but when speaking to the patient her symptoms continue to be the same. She may have been overthinking when answering the PHQ-9. After discussion with patient, she would like to remain on the same dose of Zoloft. She would like to see if counseling helps. Contact information for Dr. KaDoneta Publicealth given.  - follow up in 4 weeks  Sore On Buttock:  Clinically looks like a stage 2 pressure ulcer but I am uncertain why she has this because she is active. This could be a cellulitis. No sign of abscess. She is allergic to Keflex.  - Clindamycin 300 QID x 7 days  - return precautions discussed  Peripheral neuropathy:  Likely due to DM2 but also contributed by vitamin deficiencies  due to her gastric bypass. She is receiving IV iron and IM vitamin B12 and PO Vitamin D which should help. She also has onychomycosis of the toenails and  I think she would benefit from referral to podiatry.    Smiley Houseman, MD PGY [place]

## 2016-12-26 ENCOUNTER — Encounter: Payer: Self-pay | Admitting: Internal Medicine

## 2016-12-26 ENCOUNTER — Ambulatory Visit (INDEPENDENT_AMBULATORY_CARE_PROVIDER_SITE_OTHER): Payer: Medicare Other | Admitting: Internal Medicine

## 2016-12-26 VITALS — BP 142/62 | HR 63 | Temp 98.7°F | Wt 218.0 lb

## 2016-12-26 DIAGNOSIS — B351 Tinea unguium: Secondary | ICD-10-CM

## 2016-12-26 DIAGNOSIS — F32A Depression, unspecified: Secondary | ICD-10-CM

## 2016-12-26 DIAGNOSIS — L989 Disorder of the skin and subcutaneous tissue, unspecified: Secondary | ICD-10-CM

## 2016-12-26 DIAGNOSIS — F329 Major depressive disorder, single episode, unspecified: Secondary | ICD-10-CM | POA: Diagnosis not present

## 2016-12-26 DIAGNOSIS — G63 Polyneuropathy in diseases classified elsewhere: Secondary | ICD-10-CM | POA: Diagnosis not present

## 2016-12-26 MED ORDER — CLINDAMYCIN HCL 300 MG PO CAPS: 300.0000 mg | ORAL_CAPSULE | Freq: Four times a day (QID) | ORAL | 0 refills | Status: DC

## 2016-12-26 NOTE — Patient Instructions (Signed)
Continue Zoloft as we discussed Follow up in 4 weeks We made a referral to podiatry (foot doctor)

## 2016-12-27 ENCOUNTER — Inpatient Hospital Stay: Payer: Medicare Other | Attending: Oncology

## 2016-12-27 VITALS — BP 172/71 | HR 55 | Temp 98.9°F | Resp 16

## 2016-12-27 DIAGNOSIS — D509 Iron deficiency anemia, unspecified: Secondary | ICD-10-CM | POA: Insufficient documentation

## 2016-12-27 DIAGNOSIS — Z79899 Other long term (current) drug therapy: Secondary | ICD-10-CM | POA: Insufficient documentation

## 2016-12-27 MED ORDER — SODIUM CHLORIDE 0.9 % IV SOLN
510.0000 mg | Freq: Once | INTRAVENOUS | Status: AC
  Administered 2016-12-27: 510 mg via INTRAVENOUS
  Filled 2016-12-27: qty 17

## 2016-12-27 MED ORDER — SODIUM CHLORIDE 0.9 % IV SOLN
Freq: Once | INTRAVENOUS | Status: AC
  Administered 2016-12-27: 15:00:00 via INTRAVENOUS
  Filled 2016-12-27: qty 1000

## 2016-12-31 ENCOUNTER — Ambulatory Visit (HOSPITAL_COMMUNITY): Payer: Medicare Other

## 2017-01-01 DIAGNOSIS — K432 Incisional hernia without obstruction or gangrene: Secondary | ICD-10-CM | POA: Diagnosis not present

## 2017-01-01 DIAGNOSIS — K429 Umbilical hernia without obstruction or gangrene: Secondary | ICD-10-CM | POA: Diagnosis not present

## 2017-01-02 ENCOUNTER — Other Ambulatory Visit: Payer: Self-pay | Admitting: Bariatrics

## 2017-01-02 DIAGNOSIS — K429 Umbilical hernia without obstruction or gangrene: Secondary | ICD-10-CM

## 2017-01-03 ENCOUNTER — Ambulatory Visit (INDEPENDENT_AMBULATORY_CARE_PROVIDER_SITE_OTHER): Payer: Medicare Other | Admitting: *Deleted

## 2017-01-03 ENCOUNTER — Ambulatory Visit (HOSPITAL_COMMUNITY)
Admission: RE | Admit: 2017-01-03 | Discharge: 2017-01-03 | Disposition: A | Discharge status: Home / Self Care | Payer: Medicare Other | Source: Ambulatory Visit | Attending: Family Medicine | Admitting: Family Medicine

## 2017-01-03 VITALS — BP 144/60 | HR 50

## 2017-01-03 DIAGNOSIS — Z9884 Bariatric surgery status: Secondary | ICD-10-CM | POA: Diagnosis not present

## 2017-01-03 DIAGNOSIS — E042 Nontoxic multinodular goiter: Secondary | ICD-10-CM | POA: Diagnosis not present

## 2017-01-03 DIAGNOSIS — Z0130 Encounter for examination of blood pressure without abnormal findings: Secondary | ICD-10-CM

## 2017-01-03 NOTE — Progress Notes (Signed)
   Patient in nurse clinic for blood pressure check.  Patient denies any chest pain, dizziness, visual changes or headaches.  Patient's heart rate is low 50, BP 144/60 manually left arm.  Precept with Dr. Gwendlyn Deutscher; patient is currently metoprolol 25 mg daily.  Medication could be cause of low rate. If patient is not having any symptoms to continue to monitor. Return precautions given. Will forward to PCP.  Derl Barrow, RN

## 2017-01-06 ENCOUNTER — Telehealth: Payer: Self-pay | Admitting: Internal Medicine

## 2017-01-06 NOTE — Telephone Encounter (Signed)
Attempted to call patient to discuss thyroid US results. Went to [facility]. Left message to call back. [name]

## 2017-01-07 ENCOUNTER — Telehealth: Payer: Self-pay | Admitting: Internal Medicine

## 2017-01-07 DIAGNOSIS — E042 Nontoxic multinodular goiter: Secondary | ICD-10-CM

## 2017-01-07 NOTE — Telephone Encounter (Signed)
Called patient to discuss Thyroid US report. Will need repeat US in 1 year. Questions answered

## 2017-01-13 ENCOUNTER — Ambulatory Visit
Admission: RE | Admit: 2017-01-13 | Discharge: 2017-01-13 | Disposition: A | Discharge status: Home / Self Care | Payer: Medicare Other | Source: Ambulatory Visit | Attending: Bariatrics | Admitting: Bariatrics

## 2017-01-13 DIAGNOSIS — Z9884 Bariatric surgery status: Secondary | ICD-10-CM | POA: Insufficient documentation

## 2017-01-13 DIAGNOSIS — K449 Diaphragmatic hernia without obstruction or gangrene: Secondary | ICD-10-CM | POA: Insufficient documentation

## 2017-01-13 DIAGNOSIS — I700 Atherosclerosis of aorta: Secondary | ICD-10-CM | POA: Insufficient documentation

## 2017-01-13 DIAGNOSIS — M5136 Other intervertebral disc degeneration, lumbar region: Secondary | ICD-10-CM | POA: Diagnosis not present

## 2017-01-13 DIAGNOSIS — K429 Umbilical hernia without obstruction or gangrene: Secondary | ICD-10-CM | POA: Diagnosis not present

## 2017-01-13 DIAGNOSIS — I517 Cardiomegaly: Secondary | ICD-10-CM | POA: Diagnosis not present

## 2017-01-13 HISTORY — DX: Type 2 diabetes mellitus without complications: E11.9

## 2017-01-13 HISTORY — DX: Disorder of kidney and ureter, unspecified: N28.9

## 2017-01-13 LAB — POCT I-STAT CREATININE: Creatinine, Ser: 0.9 mg/dL (ref 0.44–1.00)

## 2017-01-13 MED ORDER — IOPAMIDOL (ISOVUE-300) INJECTION 61%
100.0000 mL | Freq: Once | INTRAVENOUS | Status: AC | PRN
  Administered 2017-01-13: 100 mL via INTRAVENOUS

## 2017-01-21 DIAGNOSIS — E039 Hypothyroidism, unspecified: Secondary | ICD-10-CM | POA: Diagnosis not present

## 2017-01-21 DIAGNOSIS — K210 Gastro-esophageal reflux disease with esophagitis: Secondary | ICD-10-CM | POA: Diagnosis not present

## 2017-01-21 DIAGNOSIS — K429 Umbilical hernia without obstruction or gangrene: Secondary | ICD-10-CM | POA: Diagnosis not present

## 2017-01-21 DIAGNOSIS — I10 Essential (primary) hypertension: Secondary | ICD-10-CM | POA: Diagnosis not present

## 2017-01-21 DIAGNOSIS — E7800 Pure hypercholesterolemia, unspecified: Secondary | ICD-10-CM | POA: Diagnosis not present

## 2017-01-21 DIAGNOSIS — Z7982 Long term (current) use of aspirin: Secondary | ICD-10-CM | POA: Diagnosis not present

## 2017-01-21 DIAGNOSIS — K319 Disease of stomach and duodenum, unspecified: Secondary | ICD-10-CM | POA: Diagnosis not present

## 2017-01-21 DIAGNOSIS — I2510 Atherosclerotic heart disease of native coronary artery without angina pectoris: Secondary | ICD-10-CM | POA: Diagnosis not present

## 2017-01-21 DIAGNOSIS — Z636 Dependent relative needing care at home: Secondary | ICD-10-CM | POA: Diagnosis not present

## 2017-01-21 DIAGNOSIS — E1122 Type 2 diabetes mellitus with diabetic chronic kidney disease: Secondary | ICD-10-CM | POA: Diagnosis not present

## 2017-01-21 DIAGNOSIS — K219 Gastro-esophageal reflux disease without esophagitis: Secondary | ICD-10-CM | POA: Diagnosis not present

## 2017-01-21 DIAGNOSIS — G4730 Sleep apnea, unspecified: Secondary | ICD-10-CM | POA: Diagnosis not present

## 2017-01-21 DIAGNOSIS — E1142 Type 2 diabetes mellitus with diabetic polyneuropathy: Secondary | ICD-10-CM | POA: Diagnosis not present

## 2017-01-21 DIAGNOSIS — K432 Incisional hernia without obstruction or gangrene: Secondary | ICD-10-CM | POA: Diagnosis not present

## 2017-01-21 DIAGNOSIS — Z87891 Personal history of nicotine dependence: Secondary | ICD-10-CM | POA: Diagnosis not present

## 2017-01-21 DIAGNOSIS — N189 Chronic kidney disease, unspecified: Secondary | ICD-10-CM | POA: Diagnosis not present

## 2017-01-21 DIAGNOSIS — E119 Type 2 diabetes mellitus without complications: Secondary | ICD-10-CM | POA: Diagnosis not present

## 2017-01-21 DIAGNOSIS — I129 Hypertensive chronic kidney disease with stage 1 through stage 4 chronic kidney disease, or unspecified chronic kidney disease: Secondary | ICD-10-CM | POA: Diagnosis not present

## 2017-01-21 DIAGNOSIS — K2960 Other gastritis without bleeding: Secondary | ICD-10-CM | POA: Diagnosis not present

## 2017-01-22 ENCOUNTER — Other Ambulatory Visit: Payer: Self-pay | Admitting: Internal Medicine

## 2017-01-22 NOTE — Progress Notes (Deleted)
   [facility] Phone: [phone]   Date of Visit: 01/23/2017   HPI:  Depression/Anxiety:   ROS: See HPI.  [place]:  PMH:  Chronic Venous Insufficiency Nonobstructive CAD DM2 HTN CKDII HLD Peripheral Neuropathy OSA Hx of tubular adenoma of colon Hx of multinodular goiter Psoriasis Vitamin D Deficiency Iron Deficiency Anemia Vitamin B12 Deficiency S/p Bariatric Surgery Depression    PHYSICAL EXAM: There were no vitals taken for this visit. Gen: *** HEENT: *** Heart: *** Lungs: *** Neuro: *** Ext: ***  ASSESSMENT/PLAN:  Health maintenance:  -***  No problem-specific Assessment & Plan notes found for this encounter.  FOLLOW UP: Follow up in *** for ***  Smiley Houseman, MD PGY [place]

## 2017-01-23 ENCOUNTER — Ambulatory Visit: Payer: Medicare Other | Admitting: Internal Medicine

## 2017-02-09 NOTE — Progress Notes (Signed)
   [facility] Phone: [phone]   Date of Visit: 02/10/2017   HPI:  Anxiety/Depression:  - reports she is doing a little better since starting the Sertraline 128m daily - reports that she gets worried about her daughter when she goes out and also has the stress of caring for her husband who has MS.  - GAD7 4 (somewhat difficult), PHQ9 7 (somewhat difficult). No SI/HI - we discussed how it might be beneficial to find support group for those with lovedones who have MS. She agreed to this.   Diabetic Neuropathy: - reports that Gabapentin makes her very sleepy  - she takes one in the morning and one at night (1081mBID) but this dose is still too much. Reports she used to be on 9 pills a day without any side effects.  - She continues to have symptoms of neuropathy and asks if there is anything else we could try  - reports that she was on Lyrica years ago but did not feel a difference. Is willing to try again.   DM2: - last A1c: 7.5 - Medications: Gattex (taking for short bowel syndrome)  - denies polyuria/polydipsia   ROS: See HPI.  [place]  PMH: HTN DM2 with neuropathy HLD CKD2 Depression Psoriasis OA Knee Venous Insufficiency Nonobstructive CAD OSA Hx Tubular Adenoma of Colon S/p gastric bypass with vitamin deficiencies  PHYSICAL EXAM: BP 138/64   Pulse 78   Temp 98.4 F (36.9 C) (Oral)   Ht 5' 7"  (1.702 m)   Wt 213 lb (96.6 kg)   SpO2 98%   BMI 33.36 kg/m  GEN: NAD PSYCH: Mood and affect euthymic, normal rate and volume of speech NEURO: Awake, alert, no focal deficits grossly, normal speech   ASSESSMENT/PLAN:  Health maintenance:  -Shingrix sent to pharmacy   Type 2 diabetes, controlled, with neuropathy A1c is 6.5 today. She is on Gattex for short bowel syndrome. Per patient she had a trial off of this a while ago and her symptoms returned therefore she would like to continue medication. Follow-up in 3-6 months for repeat  A1c  Peripheral neuropathy Not tolerating gabapentin. Will switch to Lyrica 75 mg twice a day. Patient to message me on my chart in a few weeks to see if there is improvement.   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 No SI or HI. Continue sertraline 100 mg daily. I think she would really benefit from taking part in support group. Patient agrees. She will look into support group resources in the community.  FOLLOW UP: Follow up in 3 mo  KaSmiley HousemanMD PGY 3 [place]

## 2017-02-10 ENCOUNTER — Ambulatory Visit (INDEPENDENT_AMBULATORY_CARE_PROVIDER_SITE_OTHER): Payer: Medicare Other | Admitting: Internal Medicine

## 2017-02-10 ENCOUNTER — Encounter: Payer: Self-pay | Admitting: Internal Medicine

## 2017-02-10 VITALS — BP 138/64 | HR 78 | Temp 98.4°F | Ht 67.0 in | Wt 213.0 lb

## 2017-02-10 DIAGNOSIS — G6289 Other specified polyneuropathies: Secondary | ICD-10-CM

## 2017-02-10 DIAGNOSIS — E1140 Type 2 diabetes mellitus with diabetic neuropathy, unspecified: Secondary | ICD-10-CM

## 2017-02-10 DIAGNOSIS — F32A Depression, unspecified: Secondary | ICD-10-CM

## 2017-02-10 DIAGNOSIS — F329 Major depressive disorder, single episode, unspecified: Secondary | ICD-10-CM | POA: Diagnosis not present

## 2017-02-10 LAB — POCT GLYCOSYLATED HEMOGLOBIN (HGB A1C): Hemoglobin A1C: 6.5

## 2017-02-10 MED ORDER — PREGABALIN 75 MG PO CAPS: 75.0000 mg | ORAL_CAPSULE | Freq: Two times a day (BID) | ORAL | 0 refills | Status: DC

## 2017-02-10 MED ORDER — ZOSTER VAC RECOMB ADJUVANTED 50 MCG/0.5ML IM SUSR: 0.5000 mL | Freq: Once | INTRAMUSCULAR | 1 refills | Status: AC

## 2017-02-10 NOTE — Assessment & Plan Note (Addendum)
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 No SI or HI. Continue sertraline 100 mg daily. I think she would really benefit from taking part in support group. Patient agrees. She will look into support group resources in the community.

## 2017-02-10 NOTE — Assessment & Plan Note (Signed)
A1c is 6.5 today. She is on Gattex for short bowel syndrome. Per patient she had a trial off of this a while ago and her symptoms returned therefore she would like to continue medication. Follow-up in 3-6 months for repeat A1c

## 2017-02-10 NOTE — Assessment & Plan Note (Signed)
Not tolerating gabapentin. Will switch to Lyrica 75 mg twice a day. Patient to message me on my chart in a few weeks to see if there is improvement.

## 2017-02-10 NOTE — Patient Instructions (Addendum)
Let's try Lyrica for your neuropathy. Stop Gabapentin.  I think it would be beneficial if you see if there are support groups for those who have loved ones with MS.  Your A1c is doing very well.  See if there are any community resources for MS patients.  Let me know what dose of metoprolol you are taking.  Follow up in about 3 months

## 2017-02-11 ENCOUNTER — Encounter: Payer: Self-pay | Admitting: Internal Medicine

## 2017-02-19 DIAGNOSIS — Z9884 Bariatric surgery status: Secondary | ICD-10-CM | POA: Diagnosis not present

## 2017-02-19 DIAGNOSIS — K2960 Other gastritis without bleeding: Secondary | ICD-10-CM | POA: Diagnosis not present

## 2017-02-19 DIAGNOSIS — K439 Ventral hernia without obstruction or gangrene: Secondary | ICD-10-CM | POA: Diagnosis not present

## 2017-02-21 ENCOUNTER — Ambulatory Visit: Payer: PRIVATE HEALTH INSURANCE | Admitting: Podiatry

## 2017-02-25 ENCOUNTER — Telehealth: Payer: Self-pay | Admitting: Psychology

## 2017-02-25 NOTE — Telephone Encounter (Signed)
Patient called and left a VM.  Reviewed Dr. Perlie Mayo note.  Called patient and left a VM.

## 2017-03-06 NOTE — Telephone Encounter (Addendum)
Patient called stating that she had previously called and did not hear back.  I did return her call on 9/4 and left a VM.  I returned it again today and her mailbox is full.  Number was [phone].  I tried her at the number she called from [phone] as well.  I reached her there.  Dr. Dallas Schimke thought she should be seen.  Patient agrees that she is stressed.  Scheduled appointment for [facility]. 9/21 at 1:30.    Patient was able to report back appointment time date and time.

## 2017-03-11 NOTE — Progress Notes (Deleted)
[name]  [phone] [phone]) [phone]  ID: [name] OB: [birth year]  MR#: [id number]  QIH#:[id number]  Patient Care Team: [name] Houseman, MD as PCP - General (Family Medicine) [name], RD as Dietitian (Family Medicine) [name], [name] as Referring Physician (Optometry) [name] Salvia Venia Carbon, MD as Attending Physician [name])  CHIEF COMPLAINT: Iron deficiency anemia.  INTERVAL HISTORY: Patient is a 61 year old female with history of adjuvant bypass surgery was noted to have a declining hemoglobin on routine blood work. She currently feels weak and fatigued, but otherwise feels well. She has no neurologic complaints. She denies any recent fevers or illnesses. She has a good appetite and denies weight loss. She denies any chest pain or shortness of breath. She denies any nausea, vomiting, constipation, diarrhea. She has no urinary complaint. Patient otherwise feels well but offers no further specific complaints.  REVIEW OF SYSTEMS:   Review of Systems  Constitutional: Positive for malaise/fatigue. Negative for fever and weight loss.  Respiratory: Negative.  Negative for cough and shortness of breath.   Cardiovascular: Negative.  Negative for chest pain and leg swelling.  Gastrointestinal: Negative.  Negative for abdominal pain, blood in stool and melena.  Genitourinary: Negative.   Musculoskeletal: Negative.   Skin: Negative.  Negative for rash.  Neurological: Positive for weakness.  Psychiatric/Behavioral: Negative.  The patient is not nervous/anxious.     As per HPI. Otherwise, a complete review of systems is negative.  PAST MEDICAL HISTORY: Past Medical History:  Diagnosis Date  . Allergy   . ANEMIA, PERNICIOUS, HX OF 05/13/2007  . Anxiety   . Arthritis   . ASYMPTOMATIC POSTMENOPAUSAL STATUS 02/18/2008  . BACK PAIN, LUMBAR 11/19/2007  . Bowel incontinence   . Cancer ([place])    pre canceer polyps colon  . Cataract   .  Cellulitis of left lower leg 10/28/2013  . Cellulitis of leg, right 10/11/2010  . DEPRESSION, CHRONIC 10/06/2007  . Diabetes mellitus without complication ([place])   . DIABETES MELLITUS, WITH NEUROLOGICAL COMPLICATIONS 8/75/6433  . DYSLIPIDEMIA 05/13/2007  . Fecal soiling 06/28/2014  . Frozen shoulder    Lt  . GOITER, MULTINODULAR 05/13/2007  . Hx of colonic polyps 12/04/2010  . HYPERCHOLESTEROLEMIA 03/20/2010  . HYPERTENSION 03/20/2010  . Lower back pain   . NASH (nonalcoholic steatohepatitis)   . OBSTRUCTIVE SLEEP APNEA 06/12/2007   uses CPAP...has not used for 2.5 years  . PERIPHERAL NEUROPATHY 01/12/2009  . PSORIASIS 05/28/2010  . Renal insufficiency    stage 1 kd  . Sleep apnea   . UNSPECIFIED VENOUS INSUFFICIENCY 05/28/2010    PAST SURGICAL HISTORY: Past Surgical History:  Procedure Laterality Date  . COLONOSCOPY    . ELECTROCARDIOGRAM  04/16/2006  . EXAMINATION UNDER ANESTHESIA N/A 06/29/2014   Procedure: EXAM UNDER ANESTHESIA;  Surgeon: Janyth Contes, MD;  Location: [facility];  Service: Gynecology;  Laterality: N/A;  . Exercise myoview  01/24/2005  . FLEXIBLE SIGMOIDOSCOPY    . GASTRIC BYPASS  11/09/2013  . GASTRIC BYPASS    . KNEE ARTHROSCOPY  2003   right  . LAPAROSCOPY N/A 03/12/2016   Procedure: LAPAROSCOPIC ANASTOMOSIS OF INTESTINE (ENTEROENTEROSTOMY);  Surgeon: Ladora Daniel, MD;  Location: [facility];  Service: General;  Laterality: N/A;  . LEG SURGERY     metal and pins in lower left leg  . mrsa Right    arm  . ROTATOR CUFF REPAIR  2008   right  . SHOULDER ARTHROSCOPY W/ ROTATOR CUFF REPAIR Right   .  TONSILLECTOMY    . VARICOSE VEIN SURGERY     Remotef    FAMILY HISTORY: Family History  Problem Relation Age of Onset  . Hyperlipidemia Father   . Hypertension Father   . Cirrhosis Father   . Colon cancer Father 52  . Colon cancer Sister 50  . Aneurysm Mother   . Cerebral aneurysm Mother   . Colon cancer Sister   . Drug abuse Neg Hx   . CAD Neg Hx   .  Stomach cancer Neg Hx     ADVANCED DIRECTIVES (Y/N):  N  HEALTH MAINTENANCE: Social History  Substance Use Topics  . Smoking status: Former Smoker    Packs/day: 2.00    Years: 24.00    Types: Cigarettes    Start date: 06/24/1970    Quit date: 08/06/1994  . Smokeless tobacco: Never Used  . Alcohol use No     Colonoscopy:  PAP:  Bone density:  Lipid panel:  Allergies  Allergen Reactions  . Keflex [Cephalexin] Hives    Denies Airway involvement  . Adhesive [Tape] Rash    Latex  . Latex Rash  . Neosporin [Neomycin-Bacitracin Zn-Polymyx] Rash    Current Outpatient Prescriptions  Medication Sig Dispense Refill  . aspirin 81 MG tablet Take 1 tablet (81 mg total) by mouth daily. 90 tablet 0  . atorvastatin (LIPITOR) 40 MG tablet Take 1 tablet (40 mg total) by mouth daily. 90 tablet 0  . Blood Glucose Monitoring Suppl (ACCU-CHEK AVIVA PLUS) W/DEVICE KIT Please provide 1 device 1 kit 0  . Blood Glucose Monitoring Suppl (ACCU-CHEK NANO SMARTVIEW) w/Device KIT Use to check blood glucose four times a day 1 kit 0  . Cholecalciferol (VITAMIN D3) 10000 units TABS Take 1 capsule by mouth daily. 90 tablet 0  . glucose blood (ACCU-CHEK SMARTVIEW) test strip 1 each by Other route 4 (four) times daily. Use as instructed to check sugar 100 each 12  . glucose blood (ACCU-CHEK SMARTVIEW) test strip CHECK BLOOD SUGARS 4 TIMES DAILY 120 each 12  . metoprolol succinate (TOPROL-XL) 25 MG 24 hr tablet Take 0.5 tablets (12.5 mg total) by mouth daily. 90 tablet 0  . omeprazole (PRILOSEC) 20 MG capsule Take 20 mg by mouth 2 (two) times daily before a meal.    . pregabalin (LYRICA) 75 MG capsule Take 1 capsule (75 mg total) by mouth 2 (two) times daily. 60 capsule 0  . quinapril (ACCUPRIL) 5 MG tablet Take 1 tablet (5 mg total) by mouth daily. 90 tablet 0  . sertraline (ZOLOFT) 25 MG tablet TAKE 2 TABLETS BY MOUTH EVERY DAY 60 tablet 0  . Teduglutide, rDNA, (GATTEX) 5 MG KIT Inject 5 mg into the skin  daily.     No current facility-administered medications for this visit.     OBJECTIVE: There were no vitals filed for this visit.   There is no height or weight on file to calculate BMI.    ECOG FS:1 - Symptomatic but completely ambulatory  General: Well-developed, well-nourished, no acute distress. Eyes: Pink conjunctiva, anicteric sclera. HEENT: Normocephalic, moist mucous membranes, clear oropharnyx. Lungs: Clear to auscultation bilaterally. Heart: Regular rate and rhythm. No rubs, murmurs, or gallops. Abdomen: Soft, nontender, nondistended. No organomegaly noted, normoactive bowel sounds. Musculoskeletal: No edema, cyanosis, or clubbing. Neuro: Alert, answering all questions appropriately. Cranial nerves grossly intact. Skin: No rashes or petechiae noted. Psych: Normal affect. Lymphatics: No cervical, calvicular, axillary or inguinal LAD.   LAB RESULTS:  Lab Results  Component Value  Date   NA 137 03/15/2016   K 4.3 03/15/2016   CL 108 03/15/2016   CO2 25 03/15/2016   GLUCOSE 167 (H) 03/15/2016   BUN 36 (H) 03/15/2016   CREATININE 0.90 01/13/2017   CALCIUM 8.9 03/15/2016   PROT 6.6 03/13/2016   ALBUMIN 3.6 03/13/2016   AST 49 (H) 03/13/2016   ALT 37 03/13/2016   ALKPHOS 108 03/13/2016   BILITOT 0.5 03/13/2016   GFRNONAA >60 03/15/2016   GFRAA >60 03/15/2016    Lab Results  Component Value Date   WBC 5.5 12/11/2016   NEUTROABS 7.9 (H) 03/13/2016   HGB 11.1 (L) 12/11/2016   HCT 34.2 (L) 12/11/2016   MCV 71.7 (L) 12/11/2016   PLT 233 12/11/2016   Lab Results  Component Value Date   IRON 27 (L) 12/11/2016   TIBC 434 12/11/2016   IRONPCTSAT 6 (L) 12/11/2016   Lab Results  Component Value Date   FERRITIN 20 12/11/2016     STUDIES: No results found.  ASSESSMENT: Iron deficiency anemia  PLAN:    1. Iron deficiency anemia: Patient's hemoglobin is mildly decreased, but she does have significantly decreased iron stores. She also was noted to have a  decreased vitamin B-12 level. The remainder of her laboratory work was either negative or within normal limits. Patient will return to clinic in 1 and 2 weeks for 510 mg of IV Feraheme. She will also receive 1000 g intramuscular B-12. Patient will then return to clinic in 3 months with repeat laboratory work and further evaluation.  Approximately 45 minutes was spent in discussion of which greater than 50% was consultation.   Patient expressed understanding and was in agreement with this plan. She also understands that She can call clinic at any time with any questions, concerns, or complaints.    Lloyd Huger, MD   03/11/2017 10:08 PM

## 2017-03-12 ENCOUNTER — Other Ambulatory Visit: Payer: Self-pay

## 2017-03-12 DIAGNOSIS — D509 Iron deficiency anemia, unspecified: Secondary | ICD-10-CM

## 2017-03-13 ENCOUNTER — Inpatient Hospital Stay: Payer: Medicare Other

## 2017-03-13 ENCOUNTER — Inpatient Hospital Stay: Payer: Medicare Other | Admitting: Oncology

## 2017-03-14 ENCOUNTER — Ambulatory Visit: Payer: Medicare Other

## 2017-03-24 NOTE — Progress Notes (Deleted)
[facility]  [phone]) [phone] Fax:(336) [phone]  ID: [name] OB: [birth year]  MR#: [id number]  QMG#:[id number]  Patient Care Team: Smiley Houseman, MD as PCP - General (Family Medicine) Kennith Center, RD as Dietitian (Family Medicine) Macarthur Critchley, [place] as Referring Physician (Optometry) Duke Salvia Venia Carbon, MD as Attending Physician [name])  CHIEF COMPLAINT: Iron deficiency anemia.  INTERVAL HISTORY: Patient is a 61 year old female with history of adjuvant bypass surgery was noted to have a declining hemoglobin on routine blood work. She currently feels weak and fatigued, but otherwise feels well. She has no neurologic complaints. She denies any recent fevers or illnesses. She has a good appetite and denies weight loss. She denies any chest pain or shortness of breath. She denies any nausea, vomiting, constipation, diarrhea. She has no urinary complaint. Patient otherwise feels well but offers no further specific complaints.  REVIEW OF SYSTEMS:   Review of Systems  Constitutional: Positive for malaise/fatigue. Negative for fever and weight loss.  Respiratory: Negative.  Negative for cough and shortness of breath.   Cardiovascular: Negative.  Negative for chest pain and leg swelling.  Gastrointestinal: Negative.  Negative for abdominal pain, blood in stool and melena.  Genitourinary: Negative.   Musculoskeletal: Negative.   Skin: Negative.  Negative for rash.  Neurological: Positive for weakness.  Psychiatric/Behavioral: Negative.  The patient is not nervous/anxious.     As per HPI. Otherwise, a complete review of systems is negative.  PAST MEDICAL HISTORY: Past Medical History:  Diagnosis Date  . Allergy   . ANEMIA, PERNICIOUS, HX OF 05/13/2007  . Anxiety   . Arthritis   . ASYMPTOMATIC POSTMENOPAUSAL STATUS 02/18/2008  . BACK PAIN, LUMBAR 11/19/2007  . Bowel incontinence   . Cancer ([place])    pre canceer polyps colon  . Cataract   .  Cellulitis of left lower leg 10/28/2013  . Cellulitis of leg, right 10/11/2010  . DEPRESSION, CHRONIC 10/06/2007  . Diabetes mellitus without complication ([place])   . DIABETES MELLITUS, WITH NEUROLOGICAL COMPLICATIONS 8/91/6945  . DYSLIPIDEMIA 05/13/2007  . Fecal soiling 06/28/2014  . Frozen shoulder    Lt  . GOITER, MULTINODULAR 05/13/2007  . Hx of colonic polyps 12/04/2010  . HYPERCHOLESTEROLEMIA 03/20/2010  . HYPERTENSION 03/20/2010  . Lower back pain   . NASH (nonalcoholic steatohepatitis)   . OBSTRUCTIVE SLEEP APNEA 06/12/2007   uses CPAP...has not used for 2.5 years  . PERIPHERAL NEUROPATHY 01/12/2009  . PSORIASIS 05/28/2010  . Renal insufficiency    stage 1 kd  . Sleep apnea   . UNSPECIFIED VENOUS INSUFFICIENCY 05/28/2010    PAST SURGICAL HISTORY: Past Surgical History:  Procedure Laterality Date  . COLONOSCOPY    . ELECTROCARDIOGRAM  04/16/2006  . EXAMINATION UNDER ANESTHESIA N/A 06/29/2014   Procedure: EXAM UNDER ANESTHESIA;  Surgeon: Janyth Contes, MD;  Location: [facility];  Service: Gynecology;  Laterality: N/A;  . Exercise myoview  01/24/2005  . FLEXIBLE SIGMOIDOSCOPY    . GASTRIC BYPASS  11/09/2013  . GASTRIC BYPASS    . KNEE ARTHROSCOPY  2003   right  . LAPAROSCOPY N/A 03/12/2016   Procedure: LAPAROSCOPIC ANASTOMOSIS OF INTESTINE (ENTEROENTEROSTOMY);  Surgeon: Ladora Daniel, MD;  Location: [facility];  Service: General;  Laterality: N/A;  . LEG SURGERY     metal and pins in lower left leg  . mrsa Right    arm  . ROTATOR CUFF REPAIR  2008   right  . SHOULDER ARTHROSCOPY W/ ROTATOR CUFF REPAIR Right   .  TONSILLECTOMY    . VARICOSE VEIN SURGERY     Remotef    FAMILY HISTORY: Family History  Problem Relation Age of Onset  . Hyperlipidemia Father   . Hypertension Father   . Cirrhosis Father   . Colon cancer Father 52  . Colon cancer Sister 50  . Aneurysm Mother   . Cerebral aneurysm Mother   . Colon cancer Sister   . Drug abuse Neg Hx   . CAD Neg Hx   .  Stomach cancer Neg Hx     ADVANCED DIRECTIVES (Y/N):  N  HEALTH MAINTENANCE: Social History  Substance Use Topics  . Smoking status: Former Smoker    Packs/day: 2.00    Years: 24.00    Types: Cigarettes    Start date: 06/24/1970    Quit date: 08/06/1994  . Smokeless tobacco: Never Used  . Alcohol use No     Colonoscopy:  PAP:  Bone density:  Lipid panel:  Allergies  Allergen Reactions  . Keflex [Cephalexin] Hives    Denies Airway involvement  . Adhesive [Tape] Rash    Latex  . Latex Rash  . Neosporin [Neomycin-Bacitracin Zn-Polymyx] Rash    Current Outpatient Prescriptions  Medication Sig Dispense Refill  . aspirin 81 MG tablet Take 1 tablet (81 mg total) by mouth daily. 90 tablet 0  . atorvastatin (LIPITOR) 40 MG tablet Take 1 tablet (40 mg total) by mouth daily. 90 tablet 0  . Blood Glucose Monitoring Suppl (ACCU-CHEK AVIVA PLUS) W/DEVICE KIT Please provide 1 device 1 kit 0  . Blood Glucose Monitoring Suppl (ACCU-CHEK NANO SMARTVIEW) w/Device KIT Use to check blood glucose four times a day 1 kit 0  . Cholecalciferol (VITAMIN D3) 10000 units TABS Take 1 capsule by mouth daily. 90 tablet 0  . glucose blood (ACCU-CHEK SMARTVIEW) test strip 1 each by Other route 4 (four) times daily. Use as instructed to check sugar 100 each 12  . glucose blood (ACCU-CHEK SMARTVIEW) test strip CHECK BLOOD SUGARS 4 TIMES DAILY 120 each 12  . metoprolol succinate (TOPROL-XL) 25 MG 24 hr tablet Take 0.5 tablets (12.5 mg total) by mouth daily. 90 tablet 0  . omeprazole (PRILOSEC) 20 MG capsule Take 20 mg by mouth 2 (two) times daily before a meal.    . pregabalin (LYRICA) 75 MG capsule Take 1 capsule (75 mg total) by mouth 2 (two) times daily. 60 capsule 0  . quinapril (ACCUPRIL) 5 MG tablet Take 1 tablet (5 mg total) by mouth daily. 90 tablet 0  . sertraline (ZOLOFT) 25 MG tablet TAKE 2 TABLETS BY MOUTH EVERY DAY 60 tablet 0  . Teduglutide, rDNA, (GATTEX) 5 MG KIT Inject 5 mg into the skin  daily.     No current facility-administered medications for this visit.     OBJECTIVE: There were no vitals filed for this visit.   There is no height or weight on file to calculate BMI.    ECOG FS:1 - Symptomatic but completely ambulatory  General: Well-developed, well-nourished, no acute distress. Eyes: Pink conjunctiva, anicteric sclera. HEENT: Normocephalic, moist mucous membranes, clear oropharnyx. Lungs: Clear to auscultation bilaterally. Heart: Regular rate and rhythm. No rubs, murmurs, or gallops. Abdomen: Soft, nontender, nondistended. No organomegaly noted, normoactive bowel sounds. Musculoskeletal: No edema, cyanosis, or clubbing. Neuro: Alert, answering all questions appropriately. Cranial nerves grossly intact. Skin: No rashes or petechiae noted. Psych: Normal affect. Lymphatics: No cervical, calvicular, axillary or inguinal LAD.   LAB RESULTS:  Lab Results  Component Value  Date   NA 137 03/15/2016   K 4.3 03/15/2016   CL 108 03/15/2016   CO2 25 03/15/2016   GLUCOSE 167 (H) 03/15/2016   BUN 36 (H) 03/15/2016   CREATININE 0.90 01/13/2017   CALCIUM 8.9 03/15/2016   PROT 6.6 03/13/2016   ALBUMIN 3.6 03/13/2016   AST 49 (H) 03/13/2016   ALT 37 03/13/2016   ALKPHOS 108 03/13/2016   BILITOT 0.5 03/13/2016   GFRNONAA >60 03/15/2016   GFRAA >60 03/15/2016    Lab Results  Component Value Date   WBC 5.5 12/11/2016   NEUTROABS 7.9 (H) 03/13/2016   HGB 11.1 (L) 12/11/2016   HCT 34.2 (L) 12/11/2016   MCV 71.7 (L) 12/11/2016   PLT 233 12/11/2016   Lab Results  Component Value Date   IRON 27 (L) 12/11/2016   TIBC 434 12/11/2016   IRONPCTSAT 6 (L) 12/11/2016   Lab Results  Component Value Date   FERRITIN 20 12/11/2016     STUDIES: No results found.  ASSESSMENT: Iron deficiency anemia  PLAN:    1. Iron deficiency anemia: Patient's hemoglobin is mildly decreased, but she does have significantly decreased iron stores. She also was noted to have a  decreased vitamin B-12 level. The remainder of her laboratory work was either negative or within normal limits. Patient will return to clinic in 1 and 2 weeks for 510 mg of IV Feraheme. She will also receive 1000 g intramuscular B-12. Patient will then return to clinic in 3 months with repeat laboratory work and further evaluation.  Approximately 45 minutes was spent in discussion of which greater than 50% was consultation.   Patient expressed understanding and was in agreement with this plan. She also understands that She can call clinic at any time with any questions, concerns, or complaints.    Lloyd Huger, MD   03/24/2017 11:41 AM

## 2017-03-27 ENCOUNTER — Ambulatory Visit (INDEPENDENT_AMBULATORY_CARE_PROVIDER_SITE_OTHER): Payer: PRIVATE HEALTH INSURANCE | Admitting: Orthopedic Surgery

## 2017-03-27 ENCOUNTER — Inpatient Hospital Stay: Payer: Medicare Other

## 2017-03-27 ENCOUNTER — Inpatient Hospital Stay: Payer: Medicare Other | Admitting: Oncology

## 2017-03-30 NOTE — Progress Notes (Signed)
[place]  [phone] [phone]  ID: [name] OB: 18-Apr-1956  MR#: [id number]  AGT#:[id number]  Patient Care Team: Smiley Houseman, MD as PCP - General (Family Medicine) Kennith Center, RD as Dietitian (Family Medicine) Macarthur Critchley, [place] as Referring Physician (Optometry) Duke Salvia Venia Carbon, MD as Attending Physician [facility]  CHIEF COMPLAINT: Iron deficiency anemia.  INTERVAL HISTORY: Patient returns to clinic today for repeat laboratory work and further evaluation. After receiving IV iron in 3 months ago, she she feels significantly improved and no longer complains of weakness or fatigue. She currently feels well and is asymptomatic. She has no neurologic complaints. She denies any recent fevers or illnesses. She has a good appetite and denies weight loss. She denies any chest pain or shortness of breath. She denies any nausea, vomiting, constipation, diarrhea. She has no urinary complaint. Patient offers no specific complaints today.  REVIEW OF SYSTEMS:   Review of Systems  Constitutional: Negative.  Negative for fever, malaise/fatigue and weight loss.  Respiratory: Negative.  Negative for cough and shortness of breath.   Cardiovascular: Negative.  Negative for chest pain and leg swelling.  Gastrointestinal: Negative.  Negative for abdominal pain, blood in stool and melena.  Genitourinary: Negative.  Negative for hematuria.  Musculoskeletal: Negative.   Skin: Negative.  Negative for rash.  Neurological: Negative for weakness.  Psychiatric/Behavioral: Negative.  The patient is not nervous/anxious.     As per HPI. Otherwise, a complete review of systems is negative.  PAST MEDICAL HISTORY: Past Medical History:  Diagnosis Date  . Allergy   . ANEMIA, PERNICIOUS, HX OF 05/13/2007  . Anxiety   . Arthritis   . ASYMPTOMATIC POSTMENOPAUSAL STATUS 02/18/2008  . BACK PAIN, LUMBAR 11/19/2007  . Bowel incontinence   . Cancer ([place])    pre canceer polyps colon  . Cataract   . Cellulitis of left lower leg 10/28/2013  . Cellulitis of leg, right 10/11/2010  . DEPRESSION, CHRONIC 10/06/2007  . Diabetes mellitus without complication ([place])   . DIABETES MELLITUS, WITH NEUROLOGICAL COMPLICATIONS 08/17/8248  . DYSLIPIDEMIA 05/13/2007  . Fecal soiling 06/28/2014  . Frozen shoulder    Lt  . GOITER, MULTINODULAR 05/13/2007  . Hx of colonic polyps 12/04/2010  . HYPERCHOLESTEROLEMIA 03/20/2010  . HYPERTENSION 03/20/2010  . Lower back pain   . NASH (nonalcoholic steatohepatitis)   . OBSTRUCTIVE SLEEP APNEA 06/12/2007   uses CPAP...has not used for 2.5 years  . PERIPHERAL NEUROPATHY 01/12/2009  . PSORIASIS 05/28/2010  . Renal insufficiency    stage 1 kd  . Sleep apnea   . UNSPECIFIED VENOUS INSUFFICIENCY 05/28/2010    PAST SURGICAL HISTORY: Past Surgical History:  Procedure Laterality Date  . COLONOSCOPY    . ELECTROCARDIOGRAM  04/16/2006  . EXAMINATION UNDER ANESTHESIA N/A 06/29/2014   Procedure: EXAM UNDER ANESTHESIA;  Surgeon: Janyth Contes, MD;  Location: [facility];  Service: Gynecology;  Laterality: N/A;  . Exercise myoview  01/24/2005  . FLEXIBLE SIGMOIDOSCOPY    . GASTRIC BYPASS  11/09/2013  . GASTRIC BYPASS    . KNEE ARTHROSCOPY  2003   right  . LAPAROSCOPY N/A 03/12/2016   Procedure: LAPAROSCOPIC ANASTOMOSIS OF INTESTINE (ENTEROENTEROSTOMY);  Surgeon: Ladora Daniel, MD;  Location: [facility];  Service: General;  Laterality: N/A;  . LEG SURGERY     metal and pins in lower left leg  . mrsa Right    arm  . ROTATOR CUFF REPAIR  2008   right  . SHOULDER ARTHROSCOPY W/  ROTATOR CUFF REPAIR Right   . TONSILLECTOMY    . VARICOSE VEIN SURGERY     Remotef    FAMILY HISTORY: Family History  Problem Relation Age of Onset  . Hyperlipidemia Father   . Hypertension Father   . Cirrhosis Father   . Colon cancer Father 28  . Colon cancer Sister 70  . Aneurysm Mother   . Cerebral aneurysm Mother   . Colon cancer Sister   .  Drug abuse Neg Hx   . CAD Neg Hx   . Stomach cancer Neg Hx     ADVANCED DIRECTIVES (Y/N):  N  HEALTH MAINTENANCE: Social History  Substance Use Topics  . Smoking status: Former Smoker    Packs/day: 2.00    Years: 24.00    Types: Cigarettes    Start date: 06/24/1970    Quit date: 08/06/1994  . Smokeless tobacco: Never Used  . Alcohol use No     Colonoscopy:  PAP:  Bone density:  Lipid panel:  Allergies  Allergen Reactions  . Keflex [Cephalexin] Hives    Denies Airway involvement  . Adhesive [Tape] Rash    Latex  . Latex Rash  . Neosporin [Neomycin-Bacitracin Zn-Polymyx] Rash    Current Outpatient Prescriptions  Medication Sig Dispense Refill  . amLODipine (NORVASC) 10 MG tablet Take 10 mg by mouth.    Marland Kitchen aspirin 81 MG tablet Take 1 tablet (81 mg total) by mouth daily. 90 tablet 0  . atorvastatin (LIPITOR) 40 MG tablet Take 1 tablet (40 mg total) by mouth daily. 90 tablet 0  . Blood Glucose Monitoring Suppl (ACCU-CHEK AVIVA PLUS) W/DEVICE KIT Please provide 1 device 1 kit 0  . Blood Glucose Monitoring Suppl (ACCU-CHEK NANO SMARTVIEW) w/Device KIT Use to check blood glucose four times a day 1 kit 0  . Cholecalciferol (VITAMIN D3) 10000 units TABS Take 1 capsule by mouth daily. 90 tablet 0  . Cholecalciferol (VITAMIN D3) 5000 units TABS Take by mouth.    . diphenoxylate-atropine (LOMOTIL) 2.5-0.025 MG tablet diphenoxylate-atropine 2.5 mg-0.025 mg tablet    . gabapentin (NEURONTIN) 300 MG capsule Take 300 mg by mouth.    Marland Kitchen glucose blood (ACCU-CHEK SMARTVIEW) test strip 1 each by Other route 4 (four) times daily. Use as instructed to check sugar 100 each 12  . glucose blood (ACCU-CHEK SMARTVIEW) test strip CHECK BLOOD SUGARS 4 TIMES DAILY 120 each 12  . ketorolac (ACULAR) 0.5 % ophthalmic solution ketorolac 0.5 % eye drops    . metFORMIN (GLUCOPHAGE) 1000 MG tablet metformin 1,000 mg tablet    . metoprolol succinate (TOPROL-XL) 25 MG 24 hr tablet Take 0.5 tablets (12.5 mg  total) by mouth daily. 90 tablet 0  . metoprolol succinate (TOPROL-XL) 25 MG 24 hr tablet metoprolol succinate ER 25 mg tablet,extended release 24 hr    . omeprazole (PRILOSEC) 20 MG capsule Take 20 mg by mouth 2 (two) times daily before a meal.    . PARoxetine (PAXIL) 10 MG tablet Paxil 10 mg tablet  Take 1 tablet every day by oral route.    Marland Kitchen PARoxetine Mesylate (BRISDELLE) 7.5 MG CAPS [place] 7.5 mg capsule  Take 1 capsule every day by oral route for 30 days.    . pregabalin (LYRICA) 50 MG capsule Lyrica 50 mg capsule    . pregabalin (LYRICA) 75 MG capsule Take 1 capsule (75 mg total) by mouth 2 (two) times daily. 60 capsule 0  . quinapril (ACCUPRIL) 5 MG tablet Take 1 tablet (5 mg total)  by mouth daily. 90 tablet 0  . sertraline (ZOLOFT) 25 MG tablet TAKE 2 TABLETS BY MOUTH EVERY DAY 60 tablet 0  . sucralfate (CARAFATE) 1 GM/10ML suspension Carafate 100 mg/mL oral suspension  Take 10 mL 4 times a day by oral route as directed.    . Teduglutide, rDNA, (GATTEX) 5 MG KIT Inject 5 mg into the skin daily.    . Teduglutide, rDNA, (GATTEX) 5 MG KIT Gattex 30-Vial 5 mg subcutaneous kit    . triamterene-hydrochlorothiazide (MAXZIDE-25) 37.5-25 MG tablet triamterene 37.5 mg-hydrochlorothiazide 25 mg tablet     No current facility-administered medications for this visit.     OBJECTIVE: Vitals:   04/01/17 1203  BP: (!) 175/73  Pulse: 60  Resp: 20  Temp: 98.1 F (36.7 C)     Body mass index is 34.05 kg/m.    ECOG FS:1 - Symptomatic but completely ambulatory  General: Well-developed, well-nourished, no acute distress. Eyes: Pink conjunctiva, anicteric sclera. Lungs: Clear to auscultation bilaterally. Heart: Regular rate and rhythm. No rubs, murmurs, or gallops. Abdomen: Soft, nontender, nondistended. No organomegaly noted, normoactive bowel sounds. Musculoskeletal: No edema, cyanosis, or clubbing. Neuro: Alert, answering all questions appropriately. Cranial nerves grossly intact. Skin:  No rashes or petechiae noted. Psych: Normal affect.   LAB RESULTS:  Lab Results  Component Value Date   NA 137 03/15/2016   K 4.3 03/15/2016   CL 108 03/15/2016   CO2 25 03/15/2016   GLUCOSE 167 (H) 03/15/2016   BUN 36 (H) 03/15/2016   CREATININE 0.90 01/13/2017   CALCIUM 8.9 03/15/2016   PROT 6.6 03/13/2016   ALBUMIN 3.6 03/13/2016   AST 49 (H) 03/13/2016   ALT 37 03/13/2016   ALKPHOS 108 03/13/2016   BILITOT 0.5 03/13/2016   GFRNONAA >60 03/15/2016   GFRAA >60 03/15/2016    Lab Results  Component Value Date   WBC 5.0 04/01/2017   NEUTROABS 3.2 04/01/2017   HGB 13.1 04/01/2017   HCT 40.2 04/01/2017   MCV 82.0 04/01/2017   PLT 174 04/01/2017   Lab Results  Component Value Date   IRON 44 04/01/2017   TIBC 306 04/01/2017   IRONPCTSAT 14 04/01/2017   Lab Results  Component Value Date   FERRITIN 54 04/01/2017     STUDIES: Xr Knee 3 View Left  Result Date: 04/01/2017 AP lateral merchant left knee reviewed.  Tricompartmental arthritis arthritis is noted.  Worse in the lateral and patellofemoral compartments.  End-stage arthritis is present with mild osteopenia in the bones.  No fractures noted  Xr Knee 3 View Right  Result Date: 04/01/2017 AP lateral merchant right knee reviewed.  Tricompartmental arthritis is present worse in the lateral and patellofemoral compartments.  Some vascular clips noted in the lower tibial region.  No fractures present bones are mildly osteopenic.   ASSESSMENT: Iron deficiency anemia  PLAN:    1. Iron deficiency anemia: Patient's hemoglobin and iron stores are now within normal limits. She is also asymptomatic. Previously, the remainder of her laboratory work was either negative or within normal limits. She last received 510 mg of IV Feraheme on December 27, 2016. She does not require additional treatment today. She does not require additional B-12 either. Return to clinic in 4 months for repeat laboratory work and further  evaluation.  Approximately 20 minutes was spent in discussion of which greater than 50% was consultation.   Patient expressed understanding and was in agreement with this plan. She also understands that She can call clinic at any  time with any questions, concerns, or complaints.    Lloyd Huger, MD   04/04/2017 1:37 PM

## 2017-03-31 ENCOUNTER — Ambulatory Visit (INDEPENDENT_AMBULATORY_CARE_PROVIDER_SITE_OTHER): Payer: Medicare Other | Admitting: Orthopedic Surgery

## 2017-03-31 ENCOUNTER — Ambulatory Visit (INDEPENDENT_AMBULATORY_CARE_PROVIDER_SITE_OTHER): Payer: PRIVATE HEALTH INSURANCE | Admitting: Orthopedic Surgery

## 2017-03-31 ENCOUNTER — Ambulatory Visit (INDEPENDENT_AMBULATORY_CARE_PROVIDER_SITE_OTHER): Payer: Medicare Other

## 2017-03-31 ENCOUNTER — Encounter (INDEPENDENT_AMBULATORY_CARE_PROVIDER_SITE_OTHER): Payer: Self-pay | Admitting: Orthopedic Surgery

## 2017-03-31 DIAGNOSIS — G8929 Other chronic pain: Secondary | ICD-10-CM

## 2017-03-31 DIAGNOSIS — M25561 Pain in right knee: Secondary | ICD-10-CM | POA: Diagnosis not present

## 2017-03-31 DIAGNOSIS — M25562 Pain in left knee: Secondary | ICD-10-CM

## 2017-04-01 ENCOUNTER — Inpatient Hospital Stay: Payer: Medicare Other | Attending: Oncology | Admitting: Oncology

## 2017-04-01 ENCOUNTER — Inpatient Hospital Stay: Payer: Medicare Other

## 2017-04-01 VITALS — BP 175/73 | HR 60 | Temp 98.1°F | Resp 20 | Wt 217.4 lb

## 2017-04-01 DIAGNOSIS — Z7984 Long term (current) use of oral hypoglycemic drugs: Secondary | ICD-10-CM | POA: Insufficient documentation

## 2017-04-01 DIAGNOSIS — E7800 Pure hypercholesterolemia, unspecified: Secondary | ICD-10-CM | POA: Diagnosis not present

## 2017-04-01 DIAGNOSIS — D509 Iron deficiency anemia, unspecified: Secondary | ICD-10-CM | POA: Diagnosis not present

## 2017-04-01 DIAGNOSIS — Z9884 Bariatric surgery status: Secondary | ICD-10-CM | POA: Insufficient documentation

## 2017-04-01 DIAGNOSIS — E119 Type 2 diabetes mellitus without complications: Secondary | ICD-10-CM | POA: Insufficient documentation

## 2017-04-01 DIAGNOSIS — L409 Psoriasis, unspecified: Secondary | ICD-10-CM | POA: Diagnosis not present

## 2017-04-01 DIAGNOSIS — G4733 Obstructive sleep apnea (adult) (pediatric): Secondary | ICD-10-CM | POA: Insufficient documentation

## 2017-04-01 DIAGNOSIS — F419 Anxiety disorder, unspecified: Secondary | ICD-10-CM | POA: Diagnosis not present

## 2017-04-01 DIAGNOSIS — G629 Polyneuropathy, unspecified: Secondary | ICD-10-CM | POA: Insufficient documentation

## 2017-04-01 DIAGNOSIS — Z87891 Personal history of nicotine dependence: Secondary | ICD-10-CM | POA: Insufficient documentation

## 2017-04-01 DIAGNOSIS — F329 Major depressive disorder, single episode, unspecified: Secondary | ICD-10-CM | POA: Insufficient documentation

## 2017-04-01 DIAGNOSIS — Z7982 Long term (current) use of aspirin: Secondary | ICD-10-CM | POA: Diagnosis not present

## 2017-04-01 DIAGNOSIS — E538 Deficiency of other specified B group vitamins: Secondary | ICD-10-CM

## 2017-04-01 DIAGNOSIS — I872 Venous insufficiency (chronic) (peripheral): Secondary | ICD-10-CM | POA: Insufficient documentation

## 2017-04-01 DIAGNOSIS — Z79899 Other long term (current) drug therapy: Secondary | ICD-10-CM | POA: Diagnosis not present

## 2017-04-01 DIAGNOSIS — I10 Essential (primary) hypertension: Secondary | ICD-10-CM | POA: Insufficient documentation

## 2017-04-01 LAB — CBC WITH DIFFERENTIAL/PLATELET
Basophils Absolute: 0 10*3/uL (ref 0–0.1)
Basophils Relative: 1 %
EOS ABS: 0.2 10*3/uL (ref 0–0.7)
Eosinophils Relative: 4 %
HEMATOCRIT: 40.2 % (ref 35.0–47.0)
HEMOGLOBIN: 13.1 g/dL (ref 12.0–16.0)
LYMPHS ABS: 1.3 10*3/uL (ref 1.0–3.6)
Lymphocytes Relative: 25 %
MCH: 26.8 pg (ref 26.0–34.0)
MCHC: 32.7 g/dL (ref 32.0–36.0)
MCV: 82 fL (ref 80.0–100.0)
MONOS PCT: 7 %
Monocytes Absolute: 0.4 10*3/uL (ref 0.2–0.9)
NEUTROS PCT: 63 %
Neutro Abs: 3.2 10*3/uL (ref 1.4–6.5)
Platelets: 174 10*3/uL (ref 150–440)
RBC: 4.9 MIL/uL (ref 3.80–5.20)
RDW: 15.7 % — ABNORMAL HIGH (ref 11.5–14.5)
WBC: 5 10*3/uL (ref 3.6–11.0)

## 2017-04-01 LAB — IRON AND TIBC
Iron: 44 ug/dL (ref 28–170)
Saturation Ratios: 14 % (ref 10.4–31.8)
TIBC: 306 ug/dL (ref 250–450)
UIBC: 263 ug/dL

## 2017-04-01 LAB — FERRITIN: FERRITIN: 54 ng/mL (ref 11–307)

## 2017-04-01 NOTE — Progress Notes (Signed)
No treatment today per Dr. Grayland Ormond. LJ

## 2017-04-01 NOTE — Progress Notes (Signed)
Office Visit Note   Patient: [name]           Date of Birth: 11-Jan-1956           MRN: [id number] Visit Date: 03/31/2017 Requested by: Smiley Houseman, MD [address] [place], [place] [address] PCP: Smiley Houseman, MD  Subjective: Chief Complaint  Patient presents with  . Knee Pain    bilateral    HPI: [name] is a 61 year old patient with bilateral knee pain for years.  Left is worse than right.  She would like to discuss surgery.  She was last seen 17 years ago by me.  Reports cracking weakness giving way as well as pain which wakes her from sleep at night.  She reports pain when she turns.  She does want to exercise.  She is concerned about falling.  Doesn't take much in way of medication for the problem.  She did have a lap band surgery gastric bypass surgery which caused her to lose 140 pounds but she has some issues with that surgery and has to have some type of umbilical hernia repaired.  She is on medication for gastric related problems which is very expensive.  Her husband has progressive MS he is in a wheelchair and she is his caregiver.              ROS: All systems reviewed are negative as they relate to the chief complaint within the history of present illness.  Patient denies  fevers or chills.   Assessment & Plan: Visit Diagnoses:  1. Chronic pain of both knees     Plan: Impression is bilateral knee arthritis.  I think [name] is likely heading for knee replacement on the left-hand side.  We'll see her back at the end of October.  Would like to get her preapproved for Synvisc in both knees.  She has had cortisone before which did not help.  Will inject her knees with the gel see if that helps and potentially schedule knee replacement on the left at that time for some time later this year.  Follow-Up Instructions: No Follow-up on file.   Orders:  Orders Placed This Encounter  Procedures  . XR KNEE 3 VIEW RIGHT  . XR KNEE 3 VIEW LEFT   No orders  of the defined types were placed in this encounter.     Procedures: No procedures performed   Clinical Data: No additional findings.  Objective: Vital Signs: There were no vitals taken for this visit.  Physical Exam:   Constitutional: Patient appears well-developed HEENT:  Head: Normocephalic Eyes:EOM are normal Neck: Normal range of motion Cardiovascular: Normal rate Pulmonary/chest: Effort normal Neurologic: Patient is alert Skin: Skin is warm Psychiatric: Patient has normal mood and affect    Ortho Exam: Orthopedic exam demonstrates slight valgus alignment in the left greater than the right.  Pedal pulses palpable.  There is some venous varicosities on the right leg compared to the left but no calf tenderness is present.  Extensor mechanism is intact and range of motion is full in both knees.  No groin pain with internal/external rotation of the leg.  Collateral and cruciate ligaments are stable in skin is otherwise intact.  Specialty Comments:  No specialty comments available.  Imaging: No results found.   PMFS History: Patient Active Problem List   Diagnosis Date Noted  . B12 deficiency 12/13/2016  . Malabsorption 03/12/2016  . Tubular adenoma of colon 03/09/2016  . Vitamin D deficiency  03/09/2016  . Diarrhea 05/25/2014  . Rectovaginal fistula 05/25/2014  . Fall at home 01/04/2014  . S/P bariatric surgery-duodenal switch with sleeve gastrectomy 12/04/2013  . Nonobstructive CAD  08/26/2013  . Iron deficiency anemia 06/19/2012  . CKD (chronic kidney disease), stage II 06/04/2012  . Low back pain 03/05/2012  . At high risk for falls 06/12/2011  . Osteoarthritis of knee 04/23/2011  . UNSPECIFIED VENOUS INSUFFICIENCY 05/28/2010  . PSORIASIS 05/28/2010  . HYPERCHOLESTEROLEMIA 03/20/2010  . Essential hypertension 03/20/2010  . Chronic dermatitis of hands 05/02/2009  . Peripheral neuropathy 01/12/2009  . Type 2 diabetes, controlled, with neuropathy ([place])  02/18/2008  . Chronic depression 10/06/2007  . OBSTRUCTIVE SLEEP APNEA 06/12/2007  . GOITER, MULTINODULAR 05/13/2007   Past Medical History:  Diagnosis Date  . Allergy   . ANEMIA, PERNICIOUS, HX OF 05/13/2007  . Anxiety   . Arthritis   . ASYMPTOMATIC POSTMENOPAUSAL STATUS 02/18/2008  . BACK PAIN, LUMBAR 11/19/2007  . Bowel incontinence   . Cancer ([place])    pre canceer polyps colon  . Cataract   . Cellulitis of left lower leg 10/28/2013  . Cellulitis of leg, right 10/11/2010  . DEPRESSION, CHRONIC 10/06/2007  . Diabetes mellitus without complication ([place])   . DIABETES MELLITUS, WITH NEUROLOGICAL COMPLICATIONS 1/58/3094  . DYSLIPIDEMIA 05/13/2007  . Fecal soiling 06/28/2014  . Frozen shoulder    Lt  . GOITER, MULTINODULAR 05/13/2007  . Hx of colonic polyps 12/04/2010  . HYPERCHOLESTEROLEMIA 03/20/2010  . HYPERTENSION 03/20/2010  . Lower back pain   . NASH (nonalcoholic steatohepatitis)   . OBSTRUCTIVE SLEEP APNEA 06/12/2007   uses CPAP...has not used for 2.5 years  . PERIPHERAL NEUROPATHY 01/12/2009  . PSORIASIS 05/28/2010  . Renal insufficiency    stage 1 kd  . Sleep apnea   . UNSPECIFIED VENOUS INSUFFICIENCY 05/28/2010    Family History  Problem Relation Age of Onset  . Hyperlipidemia Father   . Hypertension Father   . Cirrhosis Father   . Colon cancer Father 29  . Colon cancer Sister 36  . Aneurysm Mother   . Cerebral aneurysm Mother   . Colon cancer Sister   . Drug abuse Neg Hx   . CAD Neg Hx   . Stomach cancer Neg Hx     Past Surgical History:  Procedure Laterality Date  . COLONOSCOPY    . ELECTROCARDIOGRAM  04/16/2006  . EXAMINATION UNDER ANESTHESIA N/A 06/29/2014   Procedure: EXAM UNDER ANESTHESIA;  Surgeon: Janyth Contes, MD;  Location: [facility];  Service: Gynecology;  Laterality: N/A;  . Exercise myoview  01/24/2005  . FLEXIBLE SIGMOIDOSCOPY    . GASTRIC BYPASS  11/09/2013  . GASTRIC BYPASS    . KNEE ARTHROSCOPY  2003   right  . LAPAROSCOPY N/A 03/12/2016     Procedure: LAPAROSCOPIC ANASTOMOSIS OF INTESTINE (ENTEROENTEROSTOMY);  Surgeon: Ladora Daniel, MD;  Location: [facility];  Service: General;  Laterality: N/A;  . LEG SURGERY     metal and pins in lower left leg  . mrsa Right    arm  . ROTATOR CUFF REPAIR  2008   right  . SHOULDER ARTHROSCOPY W/ ROTATOR CUFF REPAIR Right   . TONSILLECTOMY    . VARICOSE VEIN SURGERY     Remotef   Social History   Occupational History  . Unemployed     disabled   Social History Main Topics  . Smoking status: Former Smoker    Packs/day: 2.00    Years: 24.00  Types: Cigarettes    Start date: 06/24/1970    Quit date: 08/06/1994  . Smokeless tobacco: Never Used  . Alcohol use No  . Drug use: No  . Sexual activity: No

## 2017-04-01 NOTE — Progress Notes (Signed)
Pt in for follow up had to cancel in September.  Pt reports improved energy levels after feraheme.  Denies any difficulties today.

## 2017-04-03 ENCOUNTER — Telehealth (INDEPENDENT_AMBULATORY_CARE_PROVIDER_SITE_OTHER): Payer: Self-pay

## 2017-04-03 NOTE — Telephone Encounter (Signed)
IC patient about bilateral gel injections. Received BV and insurance requires $35 copay plus 20% of allowable amount. Per patient she would like to decline at this time. States she cannot afford gel injections at this time.

## 2017-04-07 ENCOUNTER — Ambulatory Visit (INDEPENDENT_AMBULATORY_CARE_PROVIDER_SITE_OTHER): Payer: PRIVATE HEALTH INSURANCE | Admitting: Orthopaedic Surgery

## 2017-04-09 DIAGNOSIS — K2960 Other gastritis without bleeding: Secondary | ICD-10-CM | POA: Diagnosis not present

## 2017-04-09 DIAGNOSIS — Z01818 Encounter for other preprocedural examination: Secondary | ICD-10-CM | POA: Diagnosis not present

## 2017-04-09 DIAGNOSIS — K429 Umbilical hernia without obstruction or gangrene: Secondary | ICD-10-CM | POA: Diagnosis not present

## 2017-04-09 DIAGNOSIS — Z9884 Bariatric surgery status: Secondary | ICD-10-CM | POA: Diagnosis not present

## 2017-04-11 ENCOUNTER — Ambulatory Visit (INDEPENDENT_AMBULATORY_CARE_PROVIDER_SITE_OTHER): Payer: Medicare Other | Admitting: Psychology

## 2017-04-11 ENCOUNTER — Ambulatory Visit: Payer: Medicare Other

## 2017-04-11 DIAGNOSIS — F32A Depression, unspecified: Secondary | ICD-10-CM

## 2017-04-11 DIAGNOSIS — F329 Major depressive disorder, single episode, unspecified: Secondary | ICD-10-CM

## 2017-04-11 NOTE — Progress Notes (Signed)
Reason for follow-up:  [name] saw [facility] to manage current stress associated with care-taking responsibilities.   Issues discussed:  [name] discussed the stress of caring for her husband, who is disabled due to [place] and depends on her for most daily activities. She has some support and help from her children, and a close friend who moved in with them in the past year but even with this help she feel overwhelmed and constantly worries about her husband. She reports her husband can be challenging and say hurtful things due to his own frustration. [name] reported some of her own medical challenges. Two years ago she received gastric bypass surgery and has had complications stemming from that. She will have surgery next Tuesday to correct some intestine and and bile reflux issues.   [name] discussed activities that bring her joy including having time to herself, spending time with her children and mowing the yard while listening to music.

## 2017-04-11 NOTE — Assessment & Plan Note (Signed)
Assessment/Plan Recommendations:   Shardee presented in the session with appropriate affect, though at times she became tearful discussing the stress of caring for her husband. She was very chatty and eat ease with the [facility]. Debora's GAD7 score (5) indicates mild anxiety and her PHQ9 score (5) indicates mild depression, though she did not complete items 3 and 5, thus scores should be interpreted in that context. She denied item 9, suicidal ideation. Debora appears to have some good support from her children and a close friend and also leans into her faith when she is overwhelmed, all good coping strategies for her.  [facility] used reflecting listening to validate Azuree's current feelings of stress and engaged her in a conversation about positive aspects of her life. Teddy Spike will continue to make time for herself and leave the house, which helps with her reduce her anxiety and stress. [facility] also introduced deep breathing to Debora for times when she feels unable to control her worries and practices this in session. Debora will practice this twice daily for 2 minutes and will return to see [facility] in 3 weeks.

## 2017-04-15 DIAGNOSIS — Z881 Allergy status to other antibiotic agents status: Secondary | ICD-10-CM | POA: Diagnosis not present

## 2017-04-15 DIAGNOSIS — T447X5A Adverse effect of beta-adrenoreceptor antagonists, initial encounter: Secondary | ICD-10-CM | POA: Diagnosis not present

## 2017-04-15 DIAGNOSIS — E1140 Type 2 diabetes mellitus with diabetic neuropathy, unspecified: Secondary | ICD-10-CM | POA: Diagnosis not present

## 2017-04-15 DIAGNOSIS — E1122 Type 2 diabetes mellitus with diabetic chronic kidney disease: Secondary | ICD-10-CM | POA: Diagnosis not present

## 2017-04-15 DIAGNOSIS — Z79899 Other long term (current) drug therapy: Secondary | ICD-10-CM | POA: Diagnosis not present

## 2017-04-15 DIAGNOSIS — Z87891 Personal history of nicotine dependence: Secondary | ICD-10-CM | POA: Diagnosis not present

## 2017-04-15 DIAGNOSIS — E785 Hyperlipidemia, unspecified: Secondary | ICD-10-CM | POA: Insufficient documentation

## 2017-04-15 DIAGNOSIS — I129 Hypertensive chronic kidney disease with stage 1 through stage 4 chronic kidney disease, or unspecified chronic kidney disease: Secondary | ICD-10-CM | POA: Diagnosis not present

## 2017-04-15 DIAGNOSIS — J9811 Atelectasis: Secondary | ICD-10-CM | POA: Diagnosis not present

## 2017-04-15 DIAGNOSIS — E7800 Pure hypercholesterolemia, unspecified: Secondary | ICD-10-CM | POA: Diagnosis not present

## 2017-04-15 DIAGNOSIS — D631 Anemia in chronic kidney disease: Secondary | ICD-10-CM | POA: Diagnosis not present

## 2017-04-15 DIAGNOSIS — K2960 Other gastritis without bleeding: Secondary | ICD-10-CM | POA: Diagnosis not present

## 2017-04-15 DIAGNOSIS — T465X5A Adverse effect of other antihypertensive drugs, initial encounter: Secondary | ICD-10-CM | POA: Diagnosis not present

## 2017-04-15 DIAGNOSIS — K429 Umbilical hernia without obstruction or gangrene: Secondary | ICD-10-CM | POA: Insufficient documentation

## 2017-04-15 DIAGNOSIS — N182 Chronic kidney disease, stage 2 (mild): Secondary | ICD-10-CM | POA: Diagnosis not present

## 2017-04-15 DIAGNOSIS — R001 Bradycardia, unspecified: Secondary | ICD-10-CM | POA: Diagnosis not present

## 2017-04-15 DIAGNOSIS — K439 Ventral hernia without obstruction or gangrene: Secondary | ICD-10-CM | POA: Diagnosis not present

## 2017-04-15 DIAGNOSIS — Z9884 Bariatric surgery status: Secondary | ICD-10-CM | POA: Diagnosis not present

## 2017-04-15 DIAGNOSIS — Z8249 Family history of ischemic heart disease and other diseases of the circulatory system: Secondary | ICD-10-CM | POA: Diagnosis not present

## 2017-04-15 DIAGNOSIS — I10 Essential (primary) hypertension: Secondary | ICD-10-CM | POA: Diagnosis not present

## 2017-04-15 DIAGNOSIS — K219 Gastro-esophageal reflux disease without esophagitis: Secondary | ICD-10-CM | POA: Insufficient documentation

## 2017-04-15 DIAGNOSIS — Z91048 Other nonmedicinal substance allergy status: Secondary | ICD-10-CM | POA: Diagnosis not present

## 2017-04-15 DIAGNOSIS — Z91040 Latex allergy status: Secondary | ICD-10-CM | POA: Diagnosis not present

## 2017-04-15 DIAGNOSIS — R509 Fever, unspecified: Secondary | ICD-10-CM | POA: Diagnosis not present

## 2017-04-15 DIAGNOSIS — E1165 Type 2 diabetes mellitus with hyperglycemia: Secondary | ICD-10-CM | POA: Diagnosis not present

## 2017-04-15 DIAGNOSIS — Z800 Family history of malignant neoplasm of digestive organs: Secondary | ICD-10-CM | POA: Diagnosis not present

## 2017-04-15 DIAGNOSIS — R112 Nausea with vomiting, unspecified: Secondary | ICD-10-CM | POA: Diagnosis not present

## 2017-04-15 DIAGNOSIS — E538 Deficiency of other specified B group vitamins: Secondary | ICD-10-CM | POA: Diagnosis not present

## 2017-04-15 HISTORY — DX: Hyperlipidemia, unspecified: E78.5

## 2017-04-15 HISTORY — DX: Umbilical hernia without obstruction or gangrene: K42.9

## 2017-04-15 HISTORY — DX: Gastro-esophageal reflux disease without esophagitis: K21.9

## 2017-04-16 DIAGNOSIS — I10 Essential (primary) hypertension: Secondary | ICD-10-CM | POA: Diagnosis not present

## 2017-04-16 DIAGNOSIS — J9811 Atelectasis: Secondary | ICD-10-CM | POA: Diagnosis not present

## 2017-04-23 ENCOUNTER — Ambulatory Visit (INDEPENDENT_AMBULATORY_CARE_PROVIDER_SITE_OTHER): Payer: PRIVATE HEALTH INSURANCE | Admitting: Orthopedic Surgery

## 2017-04-25 ENCOUNTER — Ambulatory Visit: Payer: Medicare Other

## 2017-05-02 ENCOUNTER — Ambulatory Visit: Payer: Medicare Other

## 2017-05-07 DIAGNOSIS — G4733 Obstructive sleep apnea (adult) (pediatric): Secondary | ICD-10-CM | POA: Diagnosis not present

## 2017-05-07 DIAGNOSIS — D508 Other iron deficiency anemias: Secondary | ICD-10-CM | POA: Diagnosis not present

## 2017-05-07 DIAGNOSIS — Z9884 Bariatric surgery status: Secondary | ICD-10-CM | POA: Diagnosis not present

## 2017-05-07 LAB — LIPID PANEL
Cholesterol: 241 — AB (ref 0–200)
HDL: 64 (ref 35–70)
LDL Cholesterol: 154
TRIGLYCERIDES: 114 (ref 40–160)

## 2017-05-22 ENCOUNTER — Emergency Department (HOSPITAL_COMMUNITY)
Admission: EM | Admit: 2017-05-22 | Discharge: 2017-05-22 | Disposition: A | Discharge status: Home / Self Care | Payer: Medicare Other | Attending: Emergency Medicine | Admitting: Emergency Medicine

## 2017-05-22 ENCOUNTER — Encounter (HOSPITAL_COMMUNITY): Payer: Self-pay | Admitting: Emergency Medicine

## 2017-05-22 ENCOUNTER — Emergency Department (HOSPITAL_COMMUNITY): Payer: Medicare Other

## 2017-05-22 ENCOUNTER — Other Ambulatory Visit: Payer: Self-pay

## 2017-05-22 DIAGNOSIS — W010XXA Fall on same level from slipping, tripping and stumbling without subsequent striking against object, initial encounter: Secondary | ICD-10-CM | POA: Insufficient documentation

## 2017-05-22 DIAGNOSIS — Y929 Unspecified place or not applicable: Secondary | ICD-10-CM | POA: Insufficient documentation

## 2017-05-22 DIAGNOSIS — Y9301 Activity, walking, marching and hiking: Secondary | ICD-10-CM | POA: Insufficient documentation

## 2017-05-22 DIAGNOSIS — S79912A Unspecified injury of left hip, initial encounter: Secondary | ICD-10-CM | POA: Diagnosis not present

## 2017-05-22 DIAGNOSIS — M25552 Pain in left hip: Secondary | ICD-10-CM | POA: Diagnosis not present

## 2017-05-22 DIAGNOSIS — Z87891 Personal history of nicotine dependence: Secondary | ICD-10-CM | POA: Insufficient documentation

## 2017-05-22 DIAGNOSIS — I129 Hypertensive chronic kidney disease with stage 1 through stage 4 chronic kidney disease, or unspecified chronic kidney disease: Secondary | ICD-10-CM | POA: Insufficient documentation

## 2017-05-22 DIAGNOSIS — N182 Chronic kidney disease, stage 2 (mild): Secondary | ICD-10-CM | POA: Diagnosis not present

## 2017-05-22 DIAGNOSIS — S7002XA Contusion of left hip, initial encounter: Secondary | ICD-10-CM | POA: Diagnosis not present

## 2017-05-22 DIAGNOSIS — Z91040 Latex allergy status: Secondary | ICD-10-CM | POA: Insufficient documentation

## 2017-05-22 DIAGNOSIS — Z85038 Personal history of other malignant neoplasm of large intestine: Secondary | ICD-10-CM | POA: Insufficient documentation

## 2017-05-22 DIAGNOSIS — E1140 Type 2 diabetes mellitus with diabetic neuropathy, unspecified: Secondary | ICD-10-CM | POA: Insufficient documentation

## 2017-05-22 DIAGNOSIS — Z7982 Long term (current) use of aspirin: Secondary | ICD-10-CM | POA: Insufficient documentation

## 2017-05-22 DIAGNOSIS — Y999 Unspecified external cause status: Secondary | ICD-10-CM | POA: Insufficient documentation

## 2017-05-22 MED ORDER — OXYCODONE-ACETAMINOPHEN 5-325 MG PO TABS
1.0000 | ORAL_TABLET | ORAL | Status: DC | PRN
  Administered 2017-05-22: 1 via ORAL
  Filled 2017-05-22: qty 1

## 2017-05-22 NOTE — ED Provider Notes (Signed)
[facility] EMERGENCY DEPARTMENT Provider Note   CSN: [id number] Arrival date & time: 05/22/17  1521     History   Chief Complaint Chief Complaint  Patient presents with  . Fall  . Hip Pain    HPI [name] is a 61 y.o. female.  HPI  61 year old female presents with left hip pain after a fall.  She was walking when she tripped and fell into a wall.  She has a large area of swelling to her left lateral hip/thigh.  She has been able to walk but it is painful.  Denies any other injuries.  Was given a Percocet in the waiting room for pain.  Pain is moderate.  Hurts most when moving her leg.  Has chronic neuropathy and chronic numbness in her feet but no new numbness and no weakness.  Past Medical History:  Diagnosis Date  . Allergy   . ANEMIA, PERNICIOUS, HX OF 05/13/2007  . Anxiety   . Arthritis   . ASYMPTOMATIC POSTMENOPAUSAL STATUS 02/18/2008  . BACK PAIN, LUMBAR 11/19/2007  . Bowel incontinence   . Cancer ([place])    pre canceer polyps colon  . Cataract   . Cellulitis of left lower leg 10/28/2013  . Cellulitis of leg, right 10/11/2010  . DEPRESSION, CHRONIC 10/06/2007  . Diabetes mellitus without complication ([place])   . DIABETES MELLITUS, WITH NEUROLOGICAL COMPLICATIONS 7/84/6962  . DYSLIPIDEMIA 05/13/2007  . Fecal soiling 06/28/2014  . Frozen shoulder    Lt  . GOITER, MULTINODULAR 05/13/2007  . Hx of colonic polyps 12/04/2010  . HYPERCHOLESTEROLEMIA 03/20/2010  . HYPERTENSION 03/20/2010  . Lower back pain   . NASH (nonalcoholic steatohepatitis)   . OBSTRUCTIVE SLEEP APNEA 06/12/2007   uses CPAP...has not used for 2.5 years  . PERIPHERAL NEUROPATHY 01/12/2009  . PSORIASIS 05/28/2010  . Renal insufficiency    stage 1 kd  . Sleep apnea   . UNSPECIFIED VENOUS INSUFFICIENCY 05/28/2010    Patient Active Problem List   Diagnosis Date Noted  . B12 deficiency 12/13/2016  . Malabsorption 03/12/2016  . Tubular adenoma of colon 03/09/2016  . Vitamin D  deficiency 03/09/2016  . Diarrhea 05/25/2014  . Rectovaginal fistula 05/25/2014  . Fall at home 01/04/2014  . S/P bariatric surgery-duodenal switch with sleeve gastrectomy 12/04/2013  . Nonobstructive CAD  08/26/2013  . Iron deficiency anemia 06/19/2012  . CKD (chronic kidney disease), stage II 06/04/2012  . Low back pain 03/05/2012  . At high risk for falls 06/12/2011  . Osteoarthritis of knee 04/23/2011  . UNSPECIFIED VENOUS INSUFFICIENCY 05/28/2010  . PSORIASIS 05/28/2010  . HYPERCHOLESTEROLEMIA 03/20/2010  . Essential hypertension 03/20/2010  . Chronic dermatitis of hands 05/02/2009  . Peripheral neuropathy 01/12/2009  . Type 2 diabetes, controlled, with neuropathy ([place]) 02/18/2008  . Chronic depression 10/06/2007  . OBSTRUCTIVE SLEEP APNEA 06/12/2007  . GOITER, MULTINODULAR 05/13/2007    Past Surgical History:  Procedure Laterality Date  . COLONOSCOPY    . ELECTROCARDIOGRAM  04/16/2006  . EXAMINATION UNDER ANESTHESIA N/A 06/29/2014   Procedure: EXAM UNDER ANESTHESIA;  Surgeon: Janyth Contes, MD;  Location: [facility];  Service: Gynecology;  Laterality: N/A;  . Exercise myoview  01/24/2005  . FLEXIBLE SIGMOIDOSCOPY    . GASTRIC BYPASS  11/09/2013  . GASTRIC BYPASS    . KNEE ARTHROSCOPY  2003   right  . LAPAROSCOPY N/A 03/12/2016   Procedure: LAPAROSCOPIC ANASTOMOSIS OF INTESTINE (ENTEROENTEROSTOMY);  Surgeon: Ladora Daniel, MD;  Location: [facility];  Service: General;  Laterality: N/A;  . LEG SURGERY     metal and pins in lower left leg  . mrsa Right    arm  . ROTATOR CUFF REPAIR  2008   right  . SHOULDER ARTHROSCOPY W/ ROTATOR CUFF REPAIR Right   . TONSILLECTOMY    . VARICOSE VEIN SURGERY     Remotef    OB History    No data available       Home Medications    Prior to Admission medications   Medication Sig Start Date End Date Taking? Authorizing Provider  amLODipine (NORVASC) 10 MG tablet Take 10 mg by mouth.    [provider]  aspirin 81 MG  tablet Take 1 tablet (81 mg total) by mouth daily. 10/25/16   Smiley Houseman, MD  atorvastatin (LIPITOR) 40 MG tablet Take 1 tablet (40 mg total) by mouth daily. 10/25/16   Smiley Houseman, MD  Blood Glucose Monitoring Suppl (ACCU-CHEK AVIVA PLUS) W/DEVICE KIT Please provide 1 device 12/13/13   Marin Olp, MD  Blood Glucose Monitoring Suppl (ACCU-CHEK NANO SMARTVIEW) w/Device KIT Use to check blood glucose four times a day 05/21/16   Smiley Houseman, MD  Cholecalciferol (VITAMIN D3) 10000 units TABS Take 1 capsule by mouth daily. 10/25/16   Smiley Houseman, MD  Cholecalciferol (VITAMIN D3) 5000 units TABS Take by mouth.    [provider]  diphenoxylate-atropine (LOMOTIL) 2.5-0.025 MG tablet diphenoxylate-atropine 2.5 mg-0.025 mg tablet    [provider]  gabapentin (NEURONTIN) 300 MG capsule Take 300 mg by mouth.    [provider]  glucose blood (ACCU-CHEK SMARTVIEW) test strip 1 each by Other route 4 (four) times daily. Use as instructed to check sugar 09/27/15   Leone Brand, MD  glucose blood (ACCU-CHEK SMARTVIEW) test strip CHECK BLOOD SUGARS 4 TIMES DAILY 10/25/16   Smiley Houseman, MD  ketorolac (ACULAR) 0.5 % ophthalmic solution ketorolac 0.5 % eye drops    [provider]  metFORMIN (GLUCOPHAGE) 1000 MG tablet metformin 1,000 mg tablet    [provider]  metoprolol succinate (TOPROL-XL) 25 MG 24 hr tablet Take 0.5 tablets (12.5 mg total) by mouth daily. 10/25/16   Smiley Houseman, MD  metoprolol succinate (TOPROL-XL) 25 MG 24 hr tablet metoprolol succinate ER 25 mg tablet,extended release 24 hr    [provider]  omeprazole (PRILOSEC) 20 MG capsule Take 20 mg by mouth 2 (two) times daily before a meal.    [provider]  PARoxetine (PAXIL) 10 MG tablet Paxil 10 mg tablet  Take 1 tablet every day by oral route.    [provider]  PARoxetine Mesylate (BRISDELLE) 7.5 MG CAPS [place] 7.5  mg capsule  Take 1 capsule every day by oral route for 30 days.    [provider]  pregabalin (LYRICA) 50 MG capsule Lyrica 50 mg capsule    [provider]  pregabalin (LYRICA) 75 MG capsule Take 1 capsule (75 mg total) by mouth 2 (two) times daily. 02/10/17   Smiley Houseman, MD  quinapril (ACCUPRIL) 5 MG tablet Take 1 tablet (5 mg total) by mouth daily. 10/25/16   Smiley Houseman, MD  sertraline (ZOLOFT) 25 MG tablet TAKE 2 TABLETS BY MOUTH EVERY DAY 01/22/17   Smiley Houseman, MD  sucralfate (CARAFATE) 1 GM/10ML suspension Carafate 100 mg/mL oral suspension  Take 10 mL 4 times a day by oral route as directed.    [provider]  Teduglutide, rDNA, (GATTEX) 5 MG KIT Inject 5 mg into the skin daily.    [provider]  Teduglutide, rDNA, (GATTEX) 5 MG KIT Gattex 30-Vial 5 mg subcutaneous kit    [provider]  triamterene-hydrochlorothiazide (MAXZIDE-25) 37.5-25 MG tablet triamterene 37.5 mg-hydrochlorothiazide 25 mg tablet    [provider]    Family History Family History  Problem Relation Age of Onset  . Hyperlipidemia Father   . Hypertension Father   . Cirrhosis Father   . Colon cancer Father 31  . Colon cancer Sister 67  . Aneurysm Mother   . Cerebral aneurysm Mother   . Colon cancer Sister   . Drug abuse Neg Hx   . CAD Neg Hx   . Stomach cancer Neg Hx     Social History Social History   Tobacco Use  . Smoking status: Former Smoker    Packs/day: 2.00    Years: 24.00    Pack years: 48.00    Types: Cigarettes    Start date: 06/24/1970    Last attempt to quit: 08/06/1994    Years since quitting: 22.8  . Smokeless tobacco: Never Used  Substance Use Topics  . Alcohol use: No    Alcohol/week: 0.0 oz  . Drug use: No     Allergies   Keflex [cephalexin]; Adhesive [tape]; Latex; and Neosporin [neomycin-bacitracin zn-polymyx]   Review of Systems Review of Systems  Musculoskeletal: Positive for  arthralgias and joint swelling.  Skin: Negative for wound.  Neurological: Negative for weakness.  All other systems reviewed and are negative.    Physical Exam Updated Vital Signs BP (!) 166/73   Pulse (!) 53   Temp 98.2 F (36.8 C) (Oral)   Resp 16   SpO2 98%   Physical Exam  Constitutional: She is oriented to person, place, and time. She appears well-developed and well-nourished.  HENT:  Head: Normocephalic and atraumatic.  Right Ear: External ear normal.  Left Ear: External ear normal.  Nose: Nose normal.  Eyes: Right eye exhibits no discharge. Left eye exhibits no discharge.  Cardiovascular: Normal rate and regular rhythm.  Pulses:      Dorsalis pedis pulses are 2+ on the left side.  Pulmonary/Chest: Effort normal.  Musculoskeletal:       Left hip: She exhibits tenderness and swelling. She exhibits normal range of motion.       Left knee: No tenderness found.       Left upper leg: She exhibits no tenderness.       Legs: Neurological: She is alert and oriented to person, place, and time.  Skin: Skin is warm and dry.  Nursing note and vitals reviewed.    ED Treatments / Results  Labs (all labs ordered are listed, but only abnormal results are displayed) Labs Reviewed - No data to display  EKG  EKG Interpretation None       Radiology Dg Hip Unilat With Pelvis 2-3 Views Left  Result Date: 05/22/2017 CLINICAL DATA:  Left hip pain after fall. EXAM: DG HIP (WITH OR WITHOUT PELVIS) 2-3V LEFT COMPARISON:  CT abdomen pelvis dated January 13, 2017. FINDINGS: There is no evidence of hip fracture or dislocation. There is no evidence of arthropathy or other focal bone abnormality. The sacroiliac joints and pubic symphysis are intact. The bones are osteopenic. Atherosclerotic vascular calcifications. IMPRESSION: No acute osseous abnormality. If occult hip fracture is suspected or if the patient is unable to bear weight, MRI is the preferred modality for further evaluation.  Electronically Signed   By: Titus Dubin M.D.   On: 05/22/2017 16:01    Procedures Procedures (including critical care time)  Medications Ordered in ED Medications  oxyCODONE-acetaminophen (PERCOCET/ROXICET) 5-325 MG per tablet 1 tablet (1 tablet Oral Given 05/22/17 1543)     Initial Impression / Assessment and Plan / ED Course  I have reviewed the triage vital signs and the nursing notes.  Pertinent labs & imaging results that were available during my care of the patient were reviewed by me and considered in my medical decision making (see chart for details).     Patient's x-ray shows no acute bony abnormality.  She is able to ambulate without difficulty to the bathroom and back.  She bears weight easily.  This appears to be contusion rather than a fracture or occult fracture.  At this point, treat with RICE protocol and follow-up with PCP if symptoms are not improving.  Discussed return precautions.  Highly doubt occult fracture.  Final Clinical Impressions(s) / ED Diagnoses   Final diagnoses:  Contusion of left hip, initial encounter    ED Discharge Orders    None       Sherwood Gambler, MD 05/22/17 2030

## 2017-05-22 NOTE — ED Triage Notes (Signed)
Pt to ER for evaluation of left hip pain after mechanical fall this morning. Significant swelling to left hip noted, patient ambulatory with pain and difficulty. Pt is a/o x4. NAD. VSS.

## 2017-05-23 ENCOUNTER — Ambulatory Visit: Payer: Medicare Other

## 2017-06-19 NOTE — Progress Notes (Signed)
   [facility] Phone: [phone]   Date of Visit: 06/20/2017   HPI:  Discharge from umbilicus:  - patient had a jejunojejunostomy performed on 04/15/2017 by Dr. Duke Salvia - she reports of initially having blood tinged drainage from her umbilicus starting in November then changed to white/yellow discharge. She had a post op visit about 3 weeks after the surgery but the drainage has been getting worse. She has to wear two bandages for the drainage.  - denies any fevers or chills - she had some abdominal cramping this morning which self resolved after a few hours - having her normal bowel movements - no vomiting. Has some nausea, but has medication given by her surgeon  - she spoke with the surgery clinic who was able to get an appointment on 06/30/17.   ROS: See HPI.  [place]:  PMH: HTN DM2 with neuropathy HLD CKD2 Depression Psoriasis OA Knee Venous Insufficiency Nonobstructive CAD OSA Hx Tubular Adenoma of Colon S/p gastric bypass   PHYSICAL EXAM: BP 120/60   Pulse (!) 59   Temp 98.7 F (37.1 C) (Oral)   Wt 218 lb (98.9 kg)   SpO2 99%   BMI 34.14 kg/m  GEN: NAD, nontoxic appearing CV: RRR, no murmurs, rubs, or gallops PULM: CTAB, normal effort ABD: Soft, reports of some mild discomfort with palpation of her abdomen but no guarding or rebound, nondistended, NABS, no organomegaly. There is some mild erythema around the umbilicus in the area where the adhesive from her band-aid contacts the skin. There is some yellow thick drainage from her umbilicus.  SKIN: No rash or cyanosis; warm and well-perfused EXTR: No lower extremity edema or calf tenderness PSYCH: Mood and affect euthymic, normal rate and volume of speech NEURO: Awake, alert, no focal deficits grossly, normal speech;  ASSESSMENT/PLAN:  1. Umbilicus discharge: after jejunojejunostomy performed on 04/15/2017. The redness around the umbilicus is most consistent with dermatitis due to the  adhesive. The pus like drainage is concerning for a possible infection. Patient is stable with no fevers and stable vitals. Contacted Dr. Duke Salvia who recommended starting patient on Bactrim DS BID. He will also call his office and have her be seen early next week (Monday or Wednesday). Called patient to inform and also discussed ED precautions.  Rx sent for Bactrim DS BID x 7 days.    Smiley Houseman, MD PGY [place]

## 2017-06-20 ENCOUNTER — Ambulatory Visit (INDEPENDENT_AMBULATORY_CARE_PROVIDER_SITE_OTHER): Payer: Medicare Other | Admitting: Internal Medicine

## 2017-06-20 ENCOUNTER — Encounter: Payer: Self-pay | Admitting: Internal Medicine

## 2017-06-20 ENCOUNTER — Other Ambulatory Visit: Payer: Self-pay

## 2017-06-20 VITALS — BP 120/60 | HR 59 | Temp 98.7°F | Wt 218.0 lb

## 2017-06-20 DIAGNOSIS — R198 Other specified symptoms and signs involving the digestive system and abdomen: Secondary | ICD-10-CM | POA: Diagnosis not present

## 2017-06-20 MED ORDER — SULFAMETHOXAZOLE-TRIMETHOPRIM 800-160 MG PO TABS: 1.0000 | ORAL_TABLET | Freq: Two times a day (BID) | ORAL | 0 refills | Status: DC

## 2017-06-20 NOTE — Patient Instructions (Addendum)
Please check your medications and let me know what medications you are taking so we have an accurate medication list. Your heart rate is slightly low. Depending on what medications you are currently taking, we may change the dose of one medication so your heart rate is a little higher. Therefore, please let me know your medications soon.   I left a message with your surgeon. I will call you as soon as he gets back to me.    Follow up in mid February for diabetes.

## 2017-06-25 DIAGNOSIS — T8149XA Infection following a procedure, other surgical site, initial encounter: Secondary | ICD-10-CM | POA: Diagnosis not present

## 2017-06-27 ENCOUNTER — Ambulatory Visit: Payer: Medicare Other

## 2017-06-30 DIAGNOSIS — R198 Other specified symptoms and signs involving the digestive system and abdomen: Secondary | ICD-10-CM | POA: Diagnosis not present

## 2017-06-30 DIAGNOSIS — Z4889 Encounter for other specified surgical aftercare: Secondary | ICD-10-CM | POA: Diagnosis not present

## 2017-07-18 ENCOUNTER — Ambulatory Visit: Payer: Medicare Other

## 2017-07-18 ENCOUNTER — Other Ambulatory Visit: Payer: Self-pay

## 2017-07-18 ENCOUNTER — Encounter: Payer: Self-pay | Admitting: Internal Medicine

## 2017-07-18 ENCOUNTER — Ambulatory Visit (INDEPENDENT_AMBULATORY_CARE_PROVIDER_SITE_OTHER): Payer: Medicare Other | Admitting: Internal Medicine

## 2017-07-18 VITALS — BP 156/68 | HR 62 | Temp 98.3°F | Ht 67.0 in

## 2017-07-18 DIAGNOSIS — I10 Essential (primary) hypertension: Secondary | ICD-10-CM

## 2017-07-18 DIAGNOSIS — J181 Lobar pneumonia, unspecified organism: Secondary | ICD-10-CM

## 2017-07-18 DIAGNOSIS — J189 Pneumonia, unspecified organism: Secondary | ICD-10-CM

## 2017-07-18 DIAGNOSIS — D509 Iron deficiency anemia, unspecified: Secondary | ICD-10-CM

## 2017-07-18 MED ORDER — AZITHROMYCIN 200 MG/5ML PO SUSR: ORAL | 0 refills | Status: DC

## 2017-07-18 NOTE — Patient Instructions (Addendum)
It was so nice to meet you!  You sound like you have a little bit of fluid in the upper part of your left lung, so I think you may be developing a pneumonia. I have prescribed some Azithromycin, which is an antibiotic. Please take 12.26m tonight, then take 6.364mdaily for the next 4 days.  If you are not getting better after you finish the antibiotics, please come back [place] Ma[place]rett Albino

## 2017-07-20 DIAGNOSIS — J189 Pneumonia, unspecified organism: Secondary | ICD-10-CM | POA: Insufficient documentation

## 2017-07-20 NOTE — Assessment & Plan Note (Signed)
Patient with productive cough and fever to 101.33F. Focal lung findings appreciated on exam in the left upper lobe, consistent with CAP. Patient with normal work of breathing on exam and O2 sat 94%, so think she can be managed in the outpatient setting with strict return precautions. - Treat with Azithromycin x 5 days - Will not obtain CXR, as it will not change management. - Discussed reasons to go to the ED in detail - Follow-up if no improvement in 5 days. Would broaden coverage and obtain imaging at that time.

## 2017-07-20 NOTE — Assessment & Plan Note (Signed)
BP 156/68 in clinic today. Will not make any medication changes at this time, as patient is acutely sick. - Follow-up with PCP for HTN management

## 2017-07-20 NOTE — Progress Notes (Deleted)
[place]  [phone]) [phone] [phone]) [phone]  ID: [name] OB: 1955/09/26  MR#: [id number]  ZJI#:[id number]  Patient Care Team: Smiley Houseman, MD as PCP - General (Family Medicine) Kennith Center, RD as Dietitian (Family Medicine) Macarthur Critchley, [place] as Referring Physician (Optometry) Duke Salvia Venia Carbon, MD as Attending Physician [facility])  CHIEF COMPLAINT: Iron deficiency anemia.  INTERVAL HISTORY: Patient returns to clinic today for repeat laboratory work and further evaluation. After receiving IV iron in 3 months ago, she she feels significantly improved and no longer complains of weakness or fatigue. She currently feels well and is asymptomatic. She has no neurologic complaints. She denies any recent fevers or illnesses. She has a good appetite and denies weight loss. She denies any chest pain or shortness of breath. She denies any nausea, vomiting, constipation, diarrhea. She has no urinary complaint. Patient offers no specific complaints today.  REVIEW OF SYSTEMS:   Review of Systems  Constitutional: Negative.  Negative for fever, malaise/fatigue and weight loss.  Respiratory: Negative.  Negative for cough and shortness of breath.   Cardiovascular: Negative.  Negative for chest pain and leg swelling.  Gastrointestinal: Negative.  Negative for abdominal pain, blood in stool and melena.  Genitourinary: Negative.  Negative for hematuria.  Musculoskeletal: Negative.   Skin: Negative.  Negative for rash.  Neurological: Negative for weakness.  Psychiatric/Behavioral: Negative.  The patient is not nervous/anxious.     As per HPI. Otherwise, a complete review of systems is negative.  PAST MEDICAL HISTORY: Past Medical History:  Diagnosis Date  . Allergy   . ANEMIA, PERNICIOUS, HX OF 05/13/2007  . Anxiety   . Arthritis   . ASYMPTOMATIC POSTMENOPAUSAL STATUS 02/18/2008  . BACK PAIN, LUMBAR 11/19/2007  . Bowel incontinence   . Cancer ([place])    pre canceer polyps colon  . Cataract   . Cellulitis of left lower leg 10/28/2013  . Cellulitis of leg, right 10/11/2010  . DEPRESSION, CHRONIC 10/06/2007  . Diabetes mellitus without complication ([place])   . DIABETES MELLITUS, WITH NEUROLOGICAL COMPLICATIONS 1/75/1025  . DYSLIPIDEMIA 05/13/2007  . Fecal soiling 06/28/2014  . Frozen shoulder    Lt  . GOITER, MULTINODULAR 05/13/2007  . Hx of colonic polyps 12/04/2010  . HYPERCHOLESTEROLEMIA 03/20/2010  . HYPERTENSION 03/20/2010  . Lower back pain   . NASH (nonalcoholic steatohepatitis)   . OBSTRUCTIVE SLEEP APNEA 06/12/2007   uses CPAP...has not used for 2.5 years  . PERIPHERAL NEUROPATHY 01/12/2009  . PSORIASIS 05/28/2010  . Renal insufficiency    stage 1 kd  . Sleep apnea   . UNSPECIFIED VENOUS INSUFFICIENCY 05/28/2010    PAST SURGICAL HISTORY: Past Surgical History:  Procedure Laterality Date  . COLONOSCOPY    . ELECTROCARDIOGRAM  04/16/2006  . EXAMINATION UNDER ANESTHESIA N/A 06/29/2014   Procedure: EXAM UNDER ANESTHESIA;  Surgeon: Janyth Contes, MD;  Location: [facility];  Service: Gynecology;  Laterality: N/A;  . Exercise myoview  01/24/2005  . FLEXIBLE SIGMOIDOSCOPY    . GASTRIC BYPASS  11/09/2013  . GASTRIC BYPASS    . KNEE ARTHROSCOPY  2003   right  . LAPAROSCOPY N/A 03/12/2016   Procedure: LAPAROSCOPIC ANASTOMOSIS OF INTESTINE (ENTEROENTEROSTOMY);  Surgeon: Ladora Daniel, MD;  Location: [facility];  Service: General;  Laterality: N/A;  . LEG SURGERY     metal and pins in lower left leg  . mrsa Right    arm  . ROTATOR CUFF REPAIR  2008   right  . SHOULDER ARTHROSCOPY W/  ROTATOR CUFF REPAIR Right   . TONSILLECTOMY    . VARICOSE VEIN SURGERY     Remotef    FAMILY HISTORY: Family History  Problem Relation Age of Onset  . Hyperlipidemia Father   . Hypertension Father   . Cirrhosis Father   . Colon cancer Father 58  . Colon cancer Sister 70  . Aneurysm Mother   . Cerebral aneurysm Mother   . Colon cancer Sister   .  Drug abuse Neg Hx   . CAD Neg Hx   . Stomach cancer Neg Hx     ADVANCED DIRECTIVES (Y/N):  N  HEALTH MAINTENANCE: Social History   Tobacco Use  . Smoking status: Former Smoker    Packs/day: 2.00    Years: 24.00    Pack years: 48.00    Types: Cigarettes    Start date: 06/24/1970    Last attempt to quit: 08/06/1994    Years since quitting: 22.9  . Smokeless tobacco: Never Used  Substance Use Topics  . Alcohol use: No    Alcohol/week: 0.0 oz  . Drug use: No     Colonoscopy:  PAP:  Bone density:  Lipid panel:  Allergies  Allergen Reactions  . Keflex [Cephalexin] Hives    Denies Airway involvement  . Adhesive [Tape] Rash    Latex  . Latex Rash  . Neosporin [Neomycin-Bacitracin Zn-Polymyx] Rash    Current Outpatient Medications  Medication Sig Dispense Refill  . amLODipine (NORVASC) 10 MG tablet Take 10 mg by mouth.    Marland Kitchen aspirin 81 MG tablet Take 1 tablet (81 mg total) by mouth daily. 90 tablet 0  . atorvastatin (LIPITOR) 40 MG tablet Take 1 tablet (40 mg total) by mouth daily. 90 tablet 0  . azithromycin (ZITHROMAX) 200 MG/5ML suspension Take 12.21m by mouth on the first day, then take 6.363mby mouth daily for 4 days 40 mL 0  . Blood Glucose Monitoring Suppl (ACCU-CHEK AVIVA PLUS) W/DEVICE KIT Please provide 1 device 1 kit 0  . Blood Glucose Monitoring Suppl (ACCU-CHEK NANO SMARTVIEW) w/Device KIT Use to check blood glucose four times a day 1 kit 0  . Cholecalciferol (VITAMIN D3) 10000 units TABS Take 1 capsule by mouth daily. 90 tablet 0  . gabapentin (NEURONTIN) 300 MG capsule Take 300 mg by mouth 2 (two) times daily.     . Marland Kitchenlucose blood (ACCU-CHEK SMARTVIEW) test strip 1 each by Other route 4 (four) times daily. Use as instructed to check sugar 100 each 12  . glucose blood (ACCU-CHEK SMARTVIEW) test strip CHECK BLOOD SUGARS 4 TIMES DAILY 120 each 12  . metFORMIN (GLUCOPHAGE) 1000 MG tablet metformin 1,000 mg tablet    . metoprolol succinate (TOPROL-XL) 25 MG 24 hr  tablet metoprolol succinate ER 25 mg tablet,extended release 24 hr    . omeprazole (PRILOSEC) 20 MG capsule Take 20 mg by mouth 2 (two) times daily before a meal.    . PARoxetine (PAXIL) 10 MG tablet Paxil 10 mg tablet  Take 1 tablet every day by oral route.    . Marland KitchenARoxetine Mesylate (BRISDELLE) 7.5 MG CAPS [place] 7.5 mg capsule  Take 1 capsule every day by oral route for 30 days.    . pregabalin (LYRICA) 50 MG capsule Lyrica 50 mg capsule    . pregabalin (LYRICA) 75 MG capsule Take 1 capsule (75 mg total) by mouth 2 (two) times daily. 60 capsule 0  . quinapril (ACCUPRIL) 5 MG tablet Take 1 tablet (5 mg total) by mouth  daily. 90 tablet 0  . sertraline (ZOLOFT) 25 MG tablet TAKE 2 TABLETS BY MOUTH EVERY DAY 60 tablet 0  . sucralfate (CARAFATE) 1 GM/10ML suspension Carafate 100 mg/mL oral suspension  Take 10 mL 4 times a day by oral route as directed.    . sulfamethoxazole-trimethoprim (BACTRIM DS,SEPTRA DS) 800-160 MG tablet Take 1 tablet by mouth 2 (two) times daily. 14 tablet 0  . Teduglutide, rDNA, (GATTEX) 5 MG KIT Inject 5 mg into the skin daily.    . Teduglutide, rDNA, (GATTEX) 5 MG KIT Gattex 30-Vial 5 mg subcutaneous kit    . triamterene-hydrochlorothiazide (MAXZIDE-25) 37.5-25 MG tablet triamterene 37.5 mg-hydrochlorothiazide 25 mg tablet     No current facility-administered medications for this visit.     OBJECTIVE: There were no vitals filed for this visit.   There is no height or weight on file to calculate BMI.    ECOG FS:1 - Symptomatic but completely ambulatory  General: Well-developed, well-nourished, no acute distress. Eyes: Pink conjunctiva, anicteric sclera. Lungs: Clear to auscultation bilaterally. Heart: Regular rate and rhythm. No rubs, murmurs, or gallops. Abdomen: Soft, nontender, nondistended. No organomegaly noted, normoactive bowel sounds. Musculoskeletal: No edema, cyanosis, or clubbing. Neuro: Alert, answering all questions appropriately. Cranial nerves  grossly intact. Skin: No rashes or petechiae noted. Psych: Normal affect.   LAB RESULTS:  Lab Results  Component Value Date   NA 137 03/15/2016   K 4.3 03/15/2016   CL 108 03/15/2016   CO2 25 03/15/2016   GLUCOSE 167 (H) 03/15/2016   BUN 36 (H) 03/15/2016   CREATININE 0.90 01/13/2017   CALCIUM 8.9 03/15/2016   PROT 6.6 03/13/2016   ALBUMIN 3.6 03/13/2016   AST 49 (H) 03/13/2016   ALT 37 03/13/2016   ALKPHOS 108 03/13/2016   BILITOT 0.5 03/13/2016   GFRNONAA >60 03/15/2016   GFRAA >60 03/15/2016    Lab Results  Component Value Date   WBC 5.0 04/01/2017   NEUTROABS 3.2 04/01/2017   HGB 13.1 04/01/2017   HCT 40.2 04/01/2017   MCV 82.0 04/01/2017   PLT 174 04/01/2017   Lab Results  Component Value Date   IRON 44 04/01/2017   TIBC 306 04/01/2017   IRONPCTSAT 14 04/01/2017   Lab Results  Component Value Date   FERRITIN 54 04/01/2017     STUDIES: No results found.  ASSESSMENT: Iron deficiency anemia  PLAN:    1. Iron deficiency anemia: Patient's hemoglobin and iron stores are now within normal limits. She is also asymptomatic. Previously, the remainder of her laboratory work was either negative or within normal limits. She last received 510 mg of IV Feraheme on December 27, 2016. She does not require additional treatment today. She does not require additional B-12 either. Return to clinic in 4 months for repeat laboratory work and further evaluation.  Approximately 20 minutes was spent in discussion of which greater than 50% was consultation.   Patient expressed understanding and was in agreement with this plan. She also understands that She can call clinic at any time with any questions, concerns, or complaints.    Lloyd Huger, MD   07/20/2017 8:50 AM

## 2017-07-20 NOTE — Progress Notes (Signed)
   [facility] Phone: [phone]  Subjective:  [name] is a 62 year old female presenting to clinic with cough and wheezing for the last 3-4 days. She states this started out as a runny nose and congestion. Cough is productive of yellow sputum. She has also had a fever at home to 101.75F. She has had mild, occasional shortness of breath when doing a lot of activity. She has been taking Nyquil, Dayquil, and Ibuprofen at home. She does not have any chronic lung disease and does not use inhalers at home. No nausea, no vomiting. No chest pain.   ROS: See HPI for pertinent positives and negatives  Past Medical History- HTN, CAD, OSA, T2DM, multinodular goiter, CKD II, HLD, s/p gastric bypass surgery  Family history reviewed for today's visit. No changes.  Social history- patient is a former smoker, quit in 1996  Objective: BP (!) 156/68   Pulse 62   Temp 98.3 F (36.8 C) (Oral)   Ht 5' 7"  (1.702 m)   SpO2 94%   BMI 34.14 kg/m  Gen: NAD, alert, cooperative with exam HEENT: NCAT, EOMI, MMM, TMs normal, oropharynx erythematous, nasal turbinates erythematous. Neck: FROM, supple, no cervical lymphadenopathy CV: RRR, no murmur Resp: Normal work of breathing, speaks in full sentences, left upper lobe with faint crackles and rhonchi, remainder of lungs are clear, no wheezing  Assessment/Plan: CAP: Patient with productive cough and fever to 101.75F. Focal lung findings appreciated on exam in the left upper lobe, consistent with CAP. Patient with normal work of breathing on exam and O2 sat 94%, so think she can be managed in the outpatient setting with strict return precautions. - Treat with Azithromycin x 5 days - Will not obtain CXR, as it will not change management. - Discussed reasons to go to the ED in detail - Follow-up if no improvement in 5 days. Would broaden coverage and obtain imaging at that time.  HTN: BP 156/68 in clinic today. Will not make any medication  changes at this time, as patient is acutely sick. - Follow-up with PCP for HTN management   Hyman Bible, MD PGY-3

## 2017-07-23 ENCOUNTER — Inpatient Hospital Stay: Payer: Medicare Other

## 2017-07-23 ENCOUNTER — Inpatient Hospital Stay: Payer: Medicare Other | Admitting: Oncology

## 2017-07-24 NOTE — Progress Notes (Signed)
   [facility] Phone: [phone]   Date of Visit: 07/25/2017   HPI:  CAP Follow Up:  - was seen in clinic on 1/25 and diagnosed with presumed CAP. Was treated with Azithromycin x 5 days. She completed antibiotic 2 days ago.  - chills improved then returned slightly  - no more fevers   - no shortness of breath - no dizziness/lightheadedness - cough is the same. She had blood tinged sputum today which is what brought her in today.  - symptoms are slightly better compared to before.  - dayquil/nyquil does not help  - mucinex does not help   ROS: See HPI.  [place]:  PMH: S/p bariatric surgery  HTN CKD II HLD Nonobstructive CAD  OSA DM2 Multinodular Goiter  Venous Insufficiency Tubular Adenoma of Colon   PHYSICAL EXAM: BP 140/78   Pulse (!) 59   Temp 97.7 F (36.5 C) (Oral)   SpO2 97%  GEN: NAD HEENT: Atraumatic, normocephalic, neck supple, EOMI, sclera clear, pharynx normal  CV: RRR, no murmurs, rubs, or gallops PULM: normal effort, crackles in the left upper lung field.  SKIN: No rash or cyanosis; warm and well-perfused PSYCH: Mood and affect euthymic, normal rate and volume of speech NEURO: Awake, alert, no focal deficits grossly, normal speech   ASSESSMENT/PLAN:  Community acquired pneumonia of left upper lobe of lung ([place]) Completed antibiotic for CAP two days ago. Her symptoms are slowly improving. No further fevers. Had blood tinged sputum today but I think this is more due to the irritation from her cough. Improving symptoms although slow are reassuring. Patient is non-toxic appearing. Follow up in mid week next week if symptoms continue. Discussed that cough can persist for 3 weeks. Tessalon perles PRN and discussed warm tea with honey to soothe throat.    Smiley Houseman, MD PGY [place]

## 2017-07-25 ENCOUNTER — Ambulatory Visit (INDEPENDENT_AMBULATORY_CARE_PROVIDER_SITE_OTHER): Payer: Medicare Other | Admitting: Internal Medicine

## 2017-07-25 ENCOUNTER — Other Ambulatory Visit: Payer: Self-pay

## 2017-07-25 ENCOUNTER — Ambulatory Visit (INDEPENDENT_AMBULATORY_CARE_PROVIDER_SITE_OTHER): Payer: Medicare Other | Admitting: Psychology

## 2017-07-25 ENCOUNTER — Encounter: Payer: Self-pay | Admitting: Internal Medicine

## 2017-07-25 VITALS — BP 140/78 | HR 59 | Temp 97.7°F

## 2017-07-25 DIAGNOSIS — J181 Lobar pneumonia, unspecified organism: Secondary | ICD-10-CM

## 2017-07-25 DIAGNOSIS — J189 Pneumonia, unspecified organism: Secondary | ICD-10-CM

## 2017-07-25 DIAGNOSIS — F32A Depression, unspecified: Secondary | ICD-10-CM

## 2017-07-25 DIAGNOSIS — F329 Major depressive disorder, single episode, unspecified: Secondary | ICD-10-CM

## 2017-07-25 MED ORDER — BENZONATATE 200 MG PO CAPS: 200.0000 mg | ORAL_CAPSULE | Freq: Two times a day (BID) | ORAL | 0 refills | Status: DC | PRN

## 2017-07-25 NOTE — Patient Instructions (Signed)
Please try tea with honey and Tessalon as needed for your cough.  If you dont feel better by early next week please return to clinic   Please make a follow up appointment with me to go over your medications

## 2017-07-25 NOTE — Assessment & Plan Note (Signed)
Assessment/Plan Recommendations:  [name] presented with mild anxiety symptoms (GAD7 =4) and mild depressive symptoms (PHQ9=6, denied item 9 SI). Her affect was appropriate throughout and she was talkative and engaged during the session.   [name] continues to struggle with care-taking responsibilities for her husband, and is quite fatigued by her constant care-giving. [name] has little time for herself and focuses most of her energy on her husband, which likely contributes to her symptoms of fatigue, low mood and anxiety. [facility] engaged in reflective listening and discussed how [name] can engage in some pleasurable activities by herself. [name] enjoys swimming and would be able to go swimming several times a week when her daughter comes to help. She reported she was 7-8 of 10 confident she could find time to go swimming several days a week and will discuss with Dr. Dallas Schimke whether she is well enough to begin working out. [name] also discussed journaling and [name] appeared enthused and will begin to do this over the next couple of weeks and will bring it what she has to her next therapy session in 2 weeks.  [name] will

## 2017-07-25 NOTE — Progress Notes (Signed)
Reason for follow-up:  [name] followed up for continued management of depressive symptoms and stress management.   Issues discussed:  [name] discussed several stressors related to her health including complications from a surgery she underwent in October and recently having beginning stages of pneumonia. She is seeing Dr. Dallas Schimke right after [facility] appointment to discuss this. 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  [facility] engaged [name] in conversation about how she can increase self-care and activities that bring her pleasure.

## 2017-07-28 DIAGNOSIS — R5382 Chronic fatigue, unspecified: Secondary | ICD-10-CM | POA: Diagnosis not present

## 2017-07-28 DIAGNOSIS — Z9884 Bariatric surgery status: Secondary | ICD-10-CM | POA: Diagnosis not present

## 2017-07-28 DIAGNOSIS — G4733 Obstructive sleep apnea (adult) (pediatric): Secondary | ICD-10-CM | POA: Diagnosis not present

## 2017-07-31 ENCOUNTER — Ambulatory Visit: Payer: Medicare Other | Admitting: Oncology

## 2017-07-31 ENCOUNTER — Ambulatory Visit: Payer: Medicare Other

## 2017-07-31 ENCOUNTER — Other Ambulatory Visit: Payer: Medicare Other

## 2017-08-08 ENCOUNTER — Ambulatory Visit: Payer: Medicare Other

## 2017-08-10 NOTE — Progress Notes (Deleted)
[place]  Telephone:([phone]) [phone] Fax:(336) [phone]  ID: CHRYS LANDGREBE OB: 10-30-1955  MR#: [id number]  VHQ#:[id number]  Patient Care Team: Smiley Houseman, MD as PCP - General (Family Medicine) Kennith Center, RD as Dietitian (Family Medicine) Macarthur Critchley, [place] as Referring Physician (Optometry) Duke Salvia Venia Carbon, MD as Attending Physician [facility])  CHIEF COMPLAINT: Iron deficiency anemia.  INTERVAL HISTORY: Patient returns to clinic today for repeat laboratory work and further evaluation. After receiving IV iron in 3 months ago, she she feels significantly improved and no longer complains of weakness or fatigue. She currently feels well and is asymptomatic. She has no neurologic complaints. She denies any recent fevers or illnesses. She has a good appetite and denies weight loss. She denies any chest pain or shortness of breath. She denies any nausea, vomiting, constipation, diarrhea. She has no urinary complaint. Patient offers no specific complaints today.  REVIEW OF SYSTEMS:   Review of Systems  Constitutional: Negative.  Negative for fever, malaise/fatigue and weight loss.  Respiratory: Negative.  Negative for cough and shortness of breath.   Cardiovascular: Negative.  Negative for chest pain and leg swelling.  Gastrointestinal: Negative.  Negative for abdominal pain, blood in stool and melena.  Genitourinary: Negative.  Negative for hematuria.  Musculoskeletal: Negative.   Skin: Negative.  Negative for rash.  Neurological: Negative for weakness.  Psychiatric/Behavioral: Negative.  The patient is not nervous/anxious.     As per HPI. Otherwise, a complete review of systems is negative.  PAST MEDICAL HISTORY: Past Medical History:  Diagnosis Date  . Allergy   . ANEMIA, PERNICIOUS, HX OF 05/13/2007  . Anxiety   . Arthritis   . ASYMPTOMATIC POSTMENOPAUSAL STATUS 02/18/2008  . BACK PAIN, LUMBAR 11/19/2007  . Bowel incontinence   . Cancer ([place])    pre canceer polyps colon  . Cataract   . Cellulitis of left lower leg 10/28/2013  . Cellulitis of leg, right 10/11/2010  . DEPRESSION, CHRONIC 10/06/2007  . Diabetes mellitus without complication ([place])   . DIABETES MELLITUS, WITH NEUROLOGICAL COMPLICATIONS 10/05/2438  . DYSLIPIDEMIA 05/13/2007  . Fecal soiling 06/28/2014  . Frozen shoulder    Lt  . GOITER, MULTINODULAR 05/13/2007  . Hx of colonic polyps 12/04/2010  . HYPERCHOLESTEROLEMIA 03/20/2010  . HYPERTENSION 03/20/2010  . Lower back pain   . NASH (nonalcoholic steatohepatitis)   . OBSTRUCTIVE SLEEP APNEA 06/12/2007   uses CPAP...has not used for 2.5 years  . PERIPHERAL NEUROPATHY 01/12/2009  . PSORIASIS 05/28/2010  . Renal insufficiency    stage 1 kd  . Sleep apnea   . UNSPECIFIED VENOUS INSUFFICIENCY 05/28/2010    PAST SURGICAL HISTORY: Past Surgical History:  Procedure Laterality Date  . COLONOSCOPY    . ELECTROCARDIOGRAM  04/16/2006  . EXAMINATION UNDER ANESTHESIA N/A 06/29/2014   Procedure: EXAM UNDER ANESTHESIA;  Surgeon: Janyth Contes, MD;  Location: [facility];  Service: Gynecology;  Laterality: N/A;  . Exercise myoview  01/24/2005  . FLEXIBLE SIGMOIDOSCOPY    . GASTRIC BYPASS  11/09/2013  . GASTRIC BYPASS    . KNEE ARTHROSCOPY  2003   right  . LAPAROSCOPY N/A 03/12/2016   Procedure: LAPAROSCOPIC ANASTOMOSIS OF INTESTINE (ENTEROENTEROSTOMY);  Surgeon: Ladora Daniel, MD;  Location: [facility];  Service: General;  Laterality: N/A;  . LEG SURGERY     metal and pins in lower left leg  . mrsa Right    arm  . ROTATOR CUFF REPAIR  2008   right  . SHOULDER ARTHROSCOPY W/  ROTATOR CUFF REPAIR Right   . TONSILLECTOMY    . VARICOSE VEIN SURGERY     Remotef    FAMILY HISTORY: Family History  Problem Relation Age of Onset  . Hyperlipidemia Father   . Hypertension Father   . Cirrhosis Father   . Colon cancer Father 38  . Colon cancer Sister 52  . Aneurysm Mother   . Cerebral aneurysm Mother   . Colon cancer Sister   .  Drug abuse Neg Hx   . CAD Neg Hx   . Stomach cancer Neg Hx     ADVANCED DIRECTIVES (Y/N):  N  HEALTH MAINTENANCE: Social History   Tobacco Use  . Smoking status: Former Smoker    Packs/day: 2.00    Years: 24.00    Pack years: 48.00    Types: Cigarettes    Start date: 06/24/1970    Last attempt to quit: 08/06/1994    Years since quitting: 23.0  . Smokeless tobacco: Never Used  Substance Use Topics  . Alcohol use: No    Alcohol/week: 0.0 oz  . Drug use: No     Colonoscopy:  PAP:  Bone density:  Lipid panel:  Allergies  Allergen Reactions  . Keflex [Cephalexin] Hives    Denies Airway involvement  . Adhesive [Tape] Rash    Latex  . Latex Rash  . Neosporin [Neomycin-Bacitracin Zn-Polymyx] Rash    Current Outpatient Medications  Medication Sig Dispense Refill  . amLODipine (NORVASC) 10 MG tablet Take 10 mg by mouth.    Marland Kitchen aspirin 81 MG tablet Take 1 tablet (81 mg total) by mouth daily. 90 tablet 0  . atorvastatin (LIPITOR) 40 MG tablet Take 1 tablet (40 mg total) by mouth daily. 90 tablet 0  . Blood Glucose Monitoring Suppl (ACCU-CHEK AVIVA PLUS) W/DEVICE KIT Please provide 1 device 1 kit 0  . Blood Glucose Monitoring Suppl (ACCU-CHEK NANO SMARTVIEW) w/Device KIT Use to check blood glucose four times a day 1 kit 0  . Cholecalciferol (VITAMIN D3) 10000 units TABS Take 1 capsule by mouth daily. 90 tablet 0  . gabapentin (NEURONTIN) 300 MG capsule Take 300 mg by mouth 2 (two) times daily.     Marland Kitchen glucose blood (ACCU-CHEK SMARTVIEW) test strip 1 each by Other route 4 (four) times daily. Use as instructed to check sugar 100 each 12  . glucose blood (ACCU-CHEK SMARTVIEW) test strip CHECK BLOOD SUGARS 4 TIMES DAILY 120 each 12  . metFORMIN (GLUCOPHAGE) 1000 MG tablet metformin 1,000 mg tablet    . metoprolol succinate (TOPROL-XL) 25 MG 24 hr tablet metoprolol succinate ER 25 mg tablet,extended release 24 hr    . omeprazole (PRILOSEC) 20 MG capsule Take 20 mg by mouth 2 (two) times  daily before a meal.    . PARoxetine (PAXIL) 10 MG tablet Paxil 10 mg tablet  Take 1 tablet every day by oral route.    Marland Kitchen PARoxetine Mesylate (BRISDELLE) 7.5 MG CAPS Brisdelle 7.5 mg capsule  Take 1 capsule every day by oral route for 30 days.    . quinapril (ACCUPRIL) 5 MG tablet Take 1 tablet (5 mg total) by mouth daily. 90 tablet 0  . sertraline (ZOLOFT) 25 MG tablet TAKE 2 TABLETS BY MOUTH EVERY DAY 60 tablet 0  . sucralfate (CARAFATE) 1 GM/10ML suspension Carafate 100 mg/mL oral suspension  Take 10 mL 4 times a day by oral route as directed.    . Teduglutide, rDNA, (GATTEX) 5 MG KIT Inject 5 mg into the skin  daily.    . triamterene-hydrochlorothiazide (MAXZIDE-25) 37.5-25 MG tablet triamterene 37.5 mg-hydrochlorothiazide 25 mg tablet     No current facility-administered medications for this visit.     OBJECTIVE: There were no vitals filed for this visit.   There is no height or weight on file to calculate BMI.    ECOG FS:1 - Symptomatic but completely ambulatory  General: Well-developed, well-nourished, no acute distress. Eyes: Pink conjunctiva, anicteric sclera. Lungs: Clear to auscultation bilaterally. Heart: Regular rate and rhythm. No rubs, murmurs, or gallops. Abdomen: Soft, nontender, nondistended. No organomegaly noted, normoactive bowel sounds. Musculoskeletal: No edema, cyanosis, or clubbing. Neuro: Alert, answering all questions appropriately. Cranial nerves grossly intact. Skin: No rashes or petechiae noted. Psych: Normal affect.   LAB RESULTS:  Lab Results  Component Value Date   NA 137 03/15/2016   K 4.3 03/15/2016   CL 108 03/15/2016   CO2 25 03/15/2016   GLUCOSE 167 (H) 03/15/2016   BUN 36 (H) 03/15/2016   CREATININE 0.90 01/13/2017   CALCIUM 8.9 03/15/2016   PROT 6.6 03/13/2016   ALBUMIN 3.6 03/13/2016   AST 49 (H) 03/13/2016   ALT 37 03/13/2016   ALKPHOS 108 03/13/2016   BILITOT 0.5 03/13/2016   GFRNONAA >60 03/15/2016   GFRAA >60 03/15/2016     Lab Results  Component Value Date   WBC 5.0 04/01/2017   NEUTROABS 3.2 04/01/2017   HGB 13.1 04/01/2017   HCT 40.2 04/01/2017   MCV 82.0 04/01/2017   PLT 174 04/01/2017   Lab Results  Component Value Date   IRON 44 04/01/2017   TIBC 306 04/01/2017   IRONPCTSAT 14 04/01/2017   Lab Results  Component Value Date   FERRITIN 54 04/01/2017     STUDIES: No results found.  ASSESSMENT: Iron deficiency anemia  PLAN:    1. Iron deficiency anemia: Patient's hemoglobin and iron stores are now within normal limits. She is also asymptomatic. Previously, the remainder of her laboratory work was either negative or within normal limits. She last received 510 mg of IV Feraheme on December 27, 2016. She does not require additional treatment today. She does not require additional B-12 either. Return to clinic in 4 months for repeat laboratory work and further evaluation.  Approximately 20 minutes was spent in discussion of which greater than 50% was consultation.   Patient expressed understanding and was in agreement with this plan. She also understands that She can call clinic at any time with any questions, concerns, or complaints.    Lloyd Huger, MD   08/10/2017 11:01 AM

## 2017-08-11 NOTE — Progress Notes (Deleted)
   [facility] Phone: [phone]   Date of Visit: 08/12/2017   HPI:  Patient presents today for a well woman exam.   Concerns today: *** Periods: *** Contraception: *** Pelvic symptoms: *** Sexual activity: *** STD Screening: *** Pap smear status: *** Exercise: *** Diet: *** Smoking: *** Alcohol: *** Drugs: *** Advance directives: *** Mood: *** Dentist: ***  ROS: See HPI  [place]:  Cancers in family: ***  PHYSICAL EXAM: There were no vitals taken for this visit. Gen: NAD, pleasant, cooperative HEENT: NCAT, PERRL, no palpable thyromegaly or anterior cervical lymphadenopathy Heart: RRR, no murmurs Lungs: CTAB, NWOB Abdomen: soft, nontender to palpation Neuro: grossly nonfocal, speech normal GU: normal appearing external genitalia without lesions. Vagina is moist with white discharge. Cervix normal in appearance. No cervical motion tenderness or tenderness on bimanual exam. No adnexal masses.   ASSESSMENT/PLAN:  # Health maintenance:  -STD screening: *** -pap smear: *** -mammogram: *** -lipid screening: *** -DEXA: *** -immunizations: *** -advance directives: *** -handout given on health maintenance topics  FOLLOW UP: Follow up in *** for ***  Smiley Houseman, MD PGY [place]

## 2017-08-12 ENCOUNTER — Encounter: Payer: Medicare Other | Admitting: Internal Medicine

## 2017-08-13 ENCOUNTER — Inpatient Hospital Stay: Payer: Medicare Other

## 2017-08-13 ENCOUNTER — Inpatient Hospital Stay: Payer: Medicare Other | Admitting: Oncology

## 2017-08-28 ENCOUNTER — Ambulatory Visit: Payer: Medicare Other | Admitting: Podiatry

## 2017-08-28 ENCOUNTER — Encounter: Payer: Self-pay | Admitting: Podiatry

## 2017-08-28 VITALS — BP 123/62 | HR 58

## 2017-08-28 DIAGNOSIS — M79675 Pain in left toe(s): Secondary | ICD-10-CM | POA: Diagnosis not present

## 2017-08-28 DIAGNOSIS — B351 Tinea unguium: Secondary | ICD-10-CM

## 2017-08-28 DIAGNOSIS — E1142 Type 2 diabetes mellitus with diabetic polyneuropathy: Secondary | ICD-10-CM | POA: Diagnosis not present

## 2017-08-28 DIAGNOSIS — M79674 Pain in right toe(s): Secondary | ICD-10-CM

## 2017-08-28 NOTE — Progress Notes (Signed)
   Subjective:    Patient ID: [name], female    DOB: February 26, 1956, 62 y.o.   MRN: [id number]  HPI this patient presents the office with chi toenails on both feet. She says that his toenails have grown thick and long and have become blackened and are actually lifting up from from the toes.  She denies any history of trauma or injury to the foot or the nails.  She says this is been present for months and has soaked in the oil but no improvement was noted.  Patient is diabetic with neuropathy.  She does have a history of having surgery at the big toe joint, left foot previously.  She presents the office today for preventative foot care services. services    Review of Systems  All other systems reviewed and are negative.      Objective:   Physical Exam General Appearance  Alert, conversant and in no acute stress.  Vascular  Dorsalis pedis and posterior tibial  pulses are palpable  bilaterally.  Capillary return is within normal limits  bilaterally. Temperature is within normal limits  bilaterally.  Neurologic  Senn-Weinstein monofilament wire test within normal limits  Right foot.  LOPS absent left foot.. Muscle power within normal limits bilaterally.  Nails Thick disfigured discolored nails with subungual debris  second toes  B/L.Marland Kitchen No evidence of bacterial infection or drainage bilaterally.  Orthopedic  No limitations of motion of motion feet .  No crepitus or effusions noted.  HAV 1st MPJ right.  DJD 1st MPJ left foot.   Skin  normotropic skin with no porokeratosis noted bilaterally.  No signs of infections or ulcers noted.          Assessment & Plan:  Onychomycosis  B/L.  Diabetes with absent LOPS left.   IE  Debridement of thick long painful nails second  B/L  RTC prn   Gardiner Barefoot DPM

## 2017-09-16 ENCOUNTER — Encounter: Payer: Self-pay | Admitting: Genetic Counselor

## 2017-09-16 ENCOUNTER — Encounter: Payer: Self-pay | Admitting: Gastroenterology

## 2017-09-16 ENCOUNTER — Telehealth: Payer: Self-pay | Admitting: Genetic Counselor

## 2017-09-16 ENCOUNTER — Ambulatory Visit (INDEPENDENT_AMBULATORY_CARE_PROVIDER_SITE_OTHER): Payer: Medicare Other | Admitting: Gastroenterology

## 2017-09-16 ENCOUNTER — Telehealth: Payer: Self-pay

## 2017-09-16 VITALS — BP 150/76 | HR 56 | Ht 67.32 in | Wt 221.1 lb

## 2017-09-16 DIAGNOSIS — R197 Diarrhea, unspecified: Secondary | ICD-10-CM

## 2017-09-16 DIAGNOSIS — Z800 Family history of malignant neoplasm of digestive organs: Secondary | ICD-10-CM | POA: Diagnosis not present

## 2017-09-16 DIAGNOSIS — Z86010 Personal history of colonic polyps: Secondary | ICD-10-CM | POA: Diagnosis not present

## 2017-09-16 NOTE — Patient Instructions (Signed)
If you are age 63 or older, your body mass index should be between 23-30. Your Body mass index is 34.3 kg/m. If this is out of the aforementioned range listed, please consider follow up with your Primary Care Provider.  If you are age 83 or younger, your body mass index should be between 19-25. Your Body mass index is 34.3 kg/m. If this is out of the aformentioned range listed, please consider follow up with your Primary Care Provider.   We have sent your information to genetic counseling. They will reach out to you to schedule.   Thank you for choosing [facility]  Dr Wilfrid Lund III

## 2017-09-16 NOTE — Progress Notes (Signed)
[facility] Progress Note  Chief Complaint: Chronic diarrhea  Subjective  History:  This is a very pleasant 62 year old woman I last saw nearly 2 years ago for chronic diarrhea.  She reported a gastric bypass and "short bowel".  Her surgeon maintained her on Gattex (GLP-2 agonist) to control her diarrhea.  When I last saw her, colonoscopy in May 2017 revealed a 12 mm tubular adenoma in the right colon, and a subcentimeter left colon tubular adenoma.  Random biopsies were negative for microscopic colitis.  I gave her an empiric course of metronidazole, which she cannot recall if it was helpful.   From Dr Arletta Bale at [facility] on 05/07/17: "Status post jejunojejunostomy performed on 04/15/2017 for diversion of bile following sips procedure preoperative heartburn and bile regurgitation have resolved. She states her stools are controlled with a few dense per week. She continues Gattex to avoid frank diarrhea "   I have found a SB xray series 10/18 showing she in fact had a duodenal switch with gastric bypass  She came today out of concern for her colon cancer risk.  As noted previously, she has a strong family history of colon cancer in her father and HER-2 siblings.  At least 1 of those people was diagnosed under age 57, 5 of her sisters died from the colon cancer.  However, what concerned her more was something she read about [facility] stating that she was increased colon cancer risk due to this and needed a colonoscopy every year.  Porshea has no rectal bleeding, her bowel habits are much the same as before.  ROS: Cardiovascular: no chest pain Respiratory: no dyspnea  The patient's Past Medical, Family and Social History were reviewed and are on file in the EMR.  Objective:  Med list reviewed  Current Outpatient Medications:  .  amLODipine (NORVASC) 10 MG tablet, Take 10 mg by mouth., Disp: , Rfl:  .  aspirin 81 MG tablet, Take 1 tablet (81 mg total) by mouth daily.,  Disp: 90 tablet, Rfl: 0 .  atorvastatin (LIPITOR) 40 MG tablet, Take 1 tablet (40 mg total) by mouth daily., Disp: 90 tablet, Rfl: 0 .  Blood Glucose Monitoring Suppl (ACCU-CHEK AVIVA PLUS) W/DEVICE KIT, Please provide 1 device, Disp: 1 kit, Rfl: 0 .  Blood Glucose Monitoring Suppl (ACCU-CHEK NANO SMARTVIEW) w/Device KIT, Use to check blood glucose four times a day, Disp: 1 kit, Rfl: 0 .  Cholecalciferol (VITAMIN D3) 10000 units TABS, Take 1 capsule by mouth daily., Disp: 90 tablet, Rfl: 0 .  gabapentin (NEURONTIN) 300 MG capsule, Take 300 mg by mouth 2 (two) times daily. , Disp: , Rfl:  .  glucose blood (ACCU-CHEK SMARTVIEW) test strip, 1 each by Other route 4 (four) times daily. Use as instructed to check sugar, Disp: 100 each, Rfl: 12 .  glucose blood (ACCU-CHEK SMARTVIEW) test strip, CHECK BLOOD SUGARS 4 TIMES DAILY, Disp: 120 each, Rfl: 12 .  metFORMIN (GLUCOPHAGE) 1000 MG tablet, metformin 1,000 mg tablet, Disp: , Rfl:  .  metoprolol succinate (TOPROL-XL) 25 MG 24 hr tablet, metoprolol succinate ER 25 mg tablet,extended release 24 hr, Disp: , Rfl:  .  omeprazole (PRILOSEC) 20 MG capsule, Take 20 mg by mouth 2 (two) times daily before a meal., Disp: , Rfl:  .  PARoxetine (PAXIL) 10 MG tablet, Paxil 10 mg tablet  Take 1 tablet every day by oral route., Disp: , Rfl:  .  PARoxetine Mesylate (BRISDELLE) 7.5 MG CAPS, Brisdelle 7.5 mg  capsule  Take 1 capsule every day by oral route for 30 days., Disp: , Rfl:  .  quinapril (ACCUPRIL) 5 MG tablet, Take 1 tablet (5 mg total) by mouth daily., Disp: 90 tablet, Rfl: 0 .  sertraline (ZOLOFT) 25 MG tablet, TAKE 2 TABLETS BY MOUTH EVERY DAY, Disp: 60 tablet, Rfl: 0 .  Teduglutide, rDNA, (GATTEX) 5 MG KIT, Inject 5 mg into the skin daily., Disp: , Rfl:  .  triamterene-hydrochlorothiazide (MAXZIDE-25) 37.5-25 MG tablet, triamterene 37.5 mg-hydrochlorothiazide 25 mg tablet, Disp: , Rfl:    Vital signs in last 24 hrs: Vitals:   09/16/17 1138  BP: (!) 150/76    Pulse: (!) 56    Physical Exam  Well-appearing woman  HEENT: sclera anicteric, oral mucosa moist without lesions  Neck: supple, no thyromegaly, JVD or lymphadenopathy  Cardiac: RRR without murmurs, S1S2 heard, no peripheral edema  Pulm: clear to auscultation bilaterally, normal RR and effort noted  Abdomen: soft, no tenderness, with active bowel sounds. No guarding or palpable hepatosplenomegaly..  Multiple surgical scars  Skin; warm and dry, no jaundice or rash   data; no recent data for review.   @ASSESSMENTPLANBEGIN @ Assessment: Encounter Diagnoses  Name Primary?  . Diarrhea, unspecified type Yes  . Family history of colon cancer   . Personal history of colonic polyps    She has chronic diarrhea from her altered small bowel anatomy from bariatric surgery.  Several months ago she had a hernia repair and apparent ?  Revision of the jejunal surgery.  Nevertheless, the diarrhea continues and is managed with this medication from her bariatric surgeon.  I am not able to find reports that this medication puts people at increased risk of colorectal cancer that would require annual colonoscopy.  However, her family history is still important and highly suggestive of possible HNPCC.  I reviewed the last colonoscopy and pathology reports with her.  At that time, I recommended a 3-year interval in keeping with current surveillance guidelines.  At this point, I have decided to refer her to genetic counseling because if she has a Lynch mutation, she would need a colonoscopy every 1-2 years.  If not, then I will keep her on our current protocol.  She is agreeable to doing so.   Total time 20 minutes, over half spent in counseling and coordination of care.   [name]

## 2017-09-16 NOTE — Telephone Encounter (Signed)
Appt has been scheduled for the pt to see Roma Kayser on 5/8 at 10am. Pt aware to arrive 30 minutes early. Letter mailed.

## 2017-09-16 NOTE — Telephone Encounter (Signed)
Referral request was sent to oncology for genetic counseling. Will await appointment information.

## 2017-09-18 NOTE — Telephone Encounter (Signed)
Pt has been scheduled for 10-29-2017 @ 10 am.

## 2017-09-19 ENCOUNTER — Ambulatory Visit: Payer: Medicare Other

## 2017-10-01 DIAGNOSIS — H25811 Combined forms of age-related cataract, right eye: Secondary | ICD-10-CM | POA: Diagnosis not present

## 2017-10-01 DIAGNOSIS — H353131 Nonexudative age-related macular degeneration, bilateral, early dry stage: Secondary | ICD-10-CM | POA: Diagnosis not present

## 2017-10-03 ENCOUNTER — Ambulatory Visit (INDEPENDENT_AMBULATORY_CARE_PROVIDER_SITE_OTHER): Payer: Medicare Other | Admitting: Psychology

## 2017-10-03 DIAGNOSIS — F329 Major depressive disorder, single episode, unspecified: Secondary | ICD-10-CM

## 2017-10-03 DIAGNOSIS — F32A Depression, unspecified: Secondary | ICD-10-CM

## 2017-10-03 NOTE — Progress Notes (Signed)
Reason for follow-up:  Saleha followed up with [facility] for continued management of depressive symptoms and stress related to caretaking of her husband.   Issues discussed:  Debora discussed the challenges of taking care of her husband full-time and the impact this is having on her. She reported her care-taking responsibilities make it difficult for her to take time for herself, which is much needed and as a result she hasn't been able to do things she enjoys and are beneficial to her, such as exercising. She reported she had gone to exercise once since her last [facility] appointment and that had made her feel very good.  [facility] discussed the importance of Reem focusing on her self and discussed barriers to scheduling time to exercise and how she could overcome these to ensure she has some consistent time for herself. [facility] also discussed strategies to start journaling, which Jecenia would like to start doing.  Lastly, [facility] discussed with Avonell that her last day at [facility] is 5/3 and Sindee has the option to schedule with another [facility] or discontinue treatment if she felt she no longer needed [facility] services.

## 2017-10-03 NOTE — Assessment & Plan Note (Addendum)
Assessment/Plan Recommendations:  Navjot presented with mild depressive symptoms (PHQ9=8, denied item 9, SI), similar to last time, but she was visibly more distressed and was tearful throughout the session discussing the challenges of taking care of her husband with MS full-time. The responsibility of constant  care for her husband likely contribute to and maintain her symptoms so it is critical that she finds some time to focus on herself, which can be very challenging for her. [facility] engaged in reflective listening and deep breathing, the latter of which Mayci found helpful and said she will continue to do on her own. [facility] discussed the importance of her finding time to focus on herself so she can reduce her stress and help her cope with her care-taking responsibilities better and together they discussed strategies that would allow Clodagh to find time to exercise on a consistent basis including scheduling it in her calendar and sharing this with a friend who can keep her accountable. Additionally, [facility] discussed strategies to start journal such as using just 1 word to describe how she felt that day, which is less intimidating than writing an entire paragraph. Latacha appeared eager to try both strategies.  [facility] also engaged Rosetta in discussion about her integrated care after 5/3, Maria's last day, and Sanoe opted to call in several weeks to schedule appointment with Casimer Lanius whose schedule aligns best with hers.

## 2017-10-06 ENCOUNTER — Ambulatory Visit: Payer: Medicare Other | Admitting: Internal Medicine

## 2017-10-07 ENCOUNTER — Ambulatory Visit: Payer: Medicare Other | Admitting: Internal Medicine

## 2017-10-12 NOTE — Progress Notes (Signed)
   [facility] Phone: [phone]   Date of Visit: 10/13/2017   HPI:  DM2:  - Metformin 539m BID - checks sugars twice a day on average. Reports "they are good"  - is interested in increasing exercise but difficult due to other responsibilities at home - she is doing a pseudoketo diet currently  Medication Management: - reports gabapentin 308mdaily makes her drowsy. This medication does help with neuropathy  - patient asks if she needs to continue to take Metoprolol 2536maily. She does have a history of non-obstructive CAD. I told her I will look into this and call her today. For now we can stop the current dose.   ROS: See HPI.  PMFFalmouthPMH: HTN DM2 HLD CKD3 Nonobstructive CAD Venous Insufficiency OSA Tubular Adenoma of Colon Psoriasis Hx of Gastric Bypass  PHYSICAL EXAM: BP 122/70   Pulse (!) 51   Temp 98.3 F (36.8 C) (Oral)   Ht 5' 7"  (1.702 m)   Wt 224 lb (101.6 kg)   BMI 35.08 kg/m  GEN: NAD  CV: RR, bradycardia, no murmurs, rubs, or gallops PULM: CTAB, normal effort ABD: Soft, nontender, nondistended, NABS, no organomegaly SKIN: No rash or cyanosis; warm and well-perfused EXTR: No lower extremity edema or calf tenderness PSYCH: Mood and affect euthymic, normal rate and volume of speech NEURO: Awake, alert, no focal deficits grossly, normal speech   ASSESSMENT/PLAN:  Nonobstructive CAD  Due to her history of non-obstructive CAD, I think she would benefit from continuing Metoprolol. Due to her heart rate (she is asymptomatic) will decreased from 40m19m 12.5 mg daily. Called and informed patient   Essential hypertension BP stable continue current medications, except we will decrease Metoprolol dose to 12.5mg 59mly   Type 2 diabetes, controlled, with neuropathy Slightly higher than before at 7 (from 6.5). She would like to take Metformin 1000mg 67m Discussed lifestyle modifications. Reports she did go to the eye doctor.    Peripheral neuropathy Due to side effects of Gabapentin, will decrease dose to 100mg q4mor now.   Vitamin D deficiency Vitamin D check today.   KanishkSmiley HousemanY 3 [facility]

## 2017-10-13 ENCOUNTER — Other Ambulatory Visit: Payer: Self-pay

## 2017-10-13 ENCOUNTER — Ambulatory Visit (INDEPENDENT_AMBULATORY_CARE_PROVIDER_SITE_OTHER): Payer: Self-pay | Admitting: Internal Medicine

## 2017-10-13 ENCOUNTER — Encounter: Payer: Self-pay | Admitting: Internal Medicine

## 2017-10-13 VITALS — BP 122/70 | HR 51 | Temp 98.3°F | Ht 67.0 in | Wt 224.0 lb

## 2017-10-13 DIAGNOSIS — I2510 Atherosclerotic heart disease of native coronary artery without angina pectoris: Secondary | ICD-10-CM

## 2017-10-13 DIAGNOSIS — E559 Vitamin D deficiency, unspecified: Secondary | ICD-10-CM | POA: Diagnosis not present

## 2017-10-13 DIAGNOSIS — I10 Essential (primary) hypertension: Secondary | ICD-10-CM | POA: Diagnosis not present

## 2017-10-13 DIAGNOSIS — G6289 Other specified polyneuropathies: Secondary | ICD-10-CM

## 2017-10-13 DIAGNOSIS — E1140 Type 2 diabetes mellitus with diabetic neuropathy, unspecified: Secondary | ICD-10-CM

## 2017-10-13 DIAGNOSIS — E7800 Pure hypercholesterolemia, unspecified: Secondary | ICD-10-CM

## 2017-10-13 LAB — POCT GLYCOSYLATED HEMOGLOBIN (HGB A1C): Hemoglobin A1C: 7

## 2017-10-13 MED ORDER — GLUCOSE BLOOD VI STRP: ORAL_STRIP | 12 refills | Status: DC

## 2017-10-13 MED ORDER — METFORMIN HCL 1000 MG PO TABS: 1000.0000 mg | ORAL_TABLET | Freq: Two times a day (BID) | ORAL | 0 refills | Status: DC

## 2017-10-13 MED ORDER — OMEPRAZOLE 20 MG PO CPDR: 20.0000 mg | DELAYED_RELEASE_CAPSULE | Freq: Every day | ORAL | 0 refills | Status: DC

## 2017-10-13 MED ORDER — ASPIRIN 81 MG PO TABS: 81.0000 mg | ORAL_TABLET | Freq: Every day | ORAL | 0 refills | Status: DC

## 2017-10-13 MED ORDER — ATORVASTATIN CALCIUM 40 MG PO TABS: 40.0000 mg | ORAL_TABLET | Freq: Every day | ORAL | 0 refills | Status: DC

## 2017-10-13 MED ORDER — METOPROLOL SUCCINATE ER 25 MG PO TB24: 12.5000 mg | ORAL_TABLET | Freq: Every day | ORAL | 0 refills | Status: DC

## 2017-10-13 MED ORDER — AMLODIPINE BESYLATE 10 MG PO TABS: 10.0000 mg | ORAL_TABLET | Freq: Every day | ORAL | 1 refills | Status: DC

## 2017-10-13 MED ORDER — SERTRALINE HCL 25 MG PO TABS: 50.0000 mg | ORAL_TABLET | Freq: Every day | ORAL | 0 refills | Status: DC

## 2017-10-13 MED ORDER — QUINAPRIL HCL 5 MG PO TABS: 5.0000 mg | ORAL_TABLET | Freq: Every day | ORAL | 0 refills | Status: DC

## 2017-10-13 MED ORDER — GABAPENTIN 100 MG PO CAPS
100.0000 mg | ORAL_CAPSULE | Freq: Every day | ORAL | 0 refills | Status: DC
Start: 2017-10-13 — End: 2018-04-03

## 2017-10-13 NOTE — Assessment & Plan Note (Signed)
BP stable continue current medications, except we will decrease Metoprolol dose to 12.58m daily

## 2017-10-13 NOTE — Assessment & Plan Note (Signed)
Due to her history of non-obstructive CAD, I think she would benefit from continuing Metoprolol. Due to her heart rate (she is asymptomatic) will decreased from 37m to 12.5 mg daily. Called and informed patient

## 2017-10-13 NOTE — Assessment & Plan Note (Signed)
Slightly higher than before at 7 (from 6.5). She would like to take Metformin 1017m BID. Discussed lifestyle modifications. Reports she did go to the eye doctor.

## 2017-10-13 NOTE — Patient Instructions (Addendum)
1. Diabetes: your a1c is 7 (from 6.5 in August).  2. We stopped your metoprolol because your heart rate is on the low end. Watch your blood pressure and heart rate 3. Get in touch with your bariatric surgeon about seeing a nutritionist  4. For the neuropathy, we will go down to 16m at night of Gabapentin  5. 90 day prescriptions sent to the pharmacy   Diet Recommendations for Diabetes   Starchy (carb) foods include: Bread, rice, pasta, potatoes, corn, crackers, bagels, muffins, all baked goods.  (Fruits, milk, and yogurt also have carbohydrate, but most of these foods will not spike your blood sugar as the starchy foods will.)  A few fruits do cause high blood sugars; use small portions of bananas (limit to 1/2 at a time), grapes, and most tropical fruits.    Protein foods include: Meat, fish, poultry, eggs, dairy foods, and beans such as pinto and kidney beans (beans also provide carbohydrate).   1. Eat at least 3 meals and 1-2 snacks per day. Never go more than 4-5 hours while awake without eating.  2. Limit starchy foods to TWO per meal and ONE per snack. ONE portion of a starchy  food is equal to the following:   - ONE slice of bread (or its equivalent, such as half of a hamburger bun).   - 1/2 cup of a "scoopable" starchy food such as potatoes or rice.   - 15 grams of carbohydrate as shown on food label.  3. Both lunch and dinner should include a protein food, a carb food, and vegetables.   - Obtain twice as many veg's as protein or carbohydrate foods for both lunch and dinner.   - Fresh or frozen veg's are best.   - Try to keep frozen veg's on hand for a quick vegetable serving.    4. Breakfast should always include protein.

## 2017-10-13 NOTE — Assessment & Plan Note (Signed)
Due to side effects of Gabapentin, will decrease dose to 167m qhs for now.

## 2017-10-13 NOTE — Assessment & Plan Note (Signed)
Vitamin D check today.

## 2017-10-14 ENCOUNTER — Telehealth: Payer: Self-pay | Admitting: Internal Medicine

## 2017-10-14 DIAGNOSIS — N179 Acute kidney failure, unspecified: Secondary | ICD-10-CM

## 2017-10-14 LAB — BASIC METABOLIC PANEL
BUN / CREAT RATIO: 17 (ref 12–28)
BUN: 38 mg/dL — ABNORMAL HIGH (ref 8–27)
CO2: 23 mmol/L (ref 20–29)
CREATININE: 2.24 mg/dL — AB (ref 0.57–1.00)
Calcium: 9.4 mg/dL (ref 8.7–10.3)
Chloride: 104 mmol/L (ref 96–106)
GFR calc Af Amer: 26 mL/min/{1.73_m2} — ABNORMAL LOW (ref >59)
GFR calc non Af Amer: 23 mL/min/{1.73_m2} — ABNORMAL LOW (ref >59)
GLUCOSE: 111 mg/dL — AB (ref 65–99)
POTASSIUM: 4.6 mmol/L (ref 3.5–5.2)
SODIUM: 142 mmol/L (ref 134–144)

## 2017-10-14 LAB — VITAMIN D 25 HYDROXY (VIT D DEFICIENCY, FRACTURES): VIT D 25 HYDROXY: 14.8 ng/mL — AB (ref 30.0–100.0)

## 2017-10-14 MED ORDER — CHOLECALCIFEROL 25 MCG (1000 UT) PO TABS
1000.0000 [IU] | ORAL_TABLET | Freq: Every day | ORAL | 3 refills | Status: DC
Start: 2017-10-14 — End: 2017-12-03

## 2017-10-14 NOTE — Telephone Encounter (Signed)
Called patient to discuss labs  1) her creatinine has significantly increased from July from 0.9 to 2.24. She is urinating fine. She has not been using any NSAIDs. She does not feel that she is dehydrated.  - recommend stopping Accupril and Metformin due to AKI - Gattex appears to renally dosed per up to date. I asked her to call her bariatric surgeon to let him know about elevated Cr and ask if dose needs to be changed.  - lab visit on Thursday for repeat BMP and UA   2) Vitamin D level is low: recommended starting Vitamin D 1000 IU daily.

## 2017-10-16 ENCOUNTER — Other Ambulatory Visit: Payer: Self-pay | Admitting: Internal Medicine

## 2017-10-16 ENCOUNTER — Other Ambulatory Visit (INDEPENDENT_AMBULATORY_CARE_PROVIDER_SITE_OTHER): Payer: Self-pay

## 2017-10-16 DIAGNOSIS — N179 Acute kidney failure, unspecified: Secondary | ICD-10-CM

## 2017-10-16 LAB — POCT URINALYSIS DIP (MANUAL ENTRY)
BILIRUBIN UA: NEGATIVE
Blood, UA: NEGATIVE
Glucose, UA: NEGATIVE mg/dL
Ketones, POC UA: NEGATIVE mg/dL
NITRITE UA: NEGATIVE
PH UA: 6.5 (ref 5.0–8.0)
PROTEIN UA: NEGATIVE mg/dL
Spec Grav, UA: 1.015 (ref 1.010–1.025)
Urobilinogen, UA: 0.2 E.U./dL

## 2017-10-16 LAB — POCT UA - MICROSCOPIC ONLY

## 2017-10-17 LAB — BASIC METABOLIC PANEL
BUN/Creatinine Ratio: 16 (ref 12–28)
BUN: 27 mg/dL (ref 8–27)
CO2: 26 mmol/L (ref 20–29)
Calcium: 9.6 mg/dL (ref 8.7–10.3)
Chloride: 105 mmol/L (ref 96–106)
Creatinine, Ser: 1.72 mg/dL — ABNORMAL HIGH (ref 0.57–1.00)
GFR calc Af Amer: 36 mL/min/{1.73_m2} — ABNORMAL LOW (ref >59)
GFR calc non Af Amer: 31 mL/min/{1.73_m2} — ABNORMAL LOW (ref >59)
GLUCOSE: 127 mg/dL — AB (ref 65–99)
POTASSIUM: 5 mmol/L (ref 3.5–5.2)
SODIUM: 143 mmol/L (ref 134–144)

## 2017-10-17 LAB — CREATININE, URINE, RANDOM: CREATININE, UR: 44.2 mg/dL

## 2017-10-17 LAB — SODIUM, URINE, RANDOM: SODIUM UR: 94 mmol/L

## 2017-10-20 ENCOUNTER — Telehealth: Payer: Self-pay | Admitting: Family Medicine

## 2017-10-20 NOTE — Telephone Encounter (Signed)
Covering Dr. Synthia Innocent inbox. It looks like she and the patient have been discussing her kidney function. Repeat labs show a slight improvement, but her kidney function has not returned to normal. She does not need to do anything differently right now to help her kidney function. I would recommend that she schedule an appt with Dr. Darnell Level to discuss this further, could you help her schedule an appt please?

## 2017-10-20 NOTE — Telephone Encounter (Signed)
LVM for pt to return my call to discuss labs and schedule FU with pcp.

## 2017-10-21 ENCOUNTER — Telehealth: Payer: Self-pay | Admitting: Internal Medicine

## 2017-10-21 DIAGNOSIS — N179 Acute kidney failure, unspecified: Secondary | ICD-10-CM

## 2017-10-21 NOTE — Telephone Encounter (Signed)
Will forward to MD. Theodora Lalanne,CMA  

## 2017-10-21 NOTE — Telephone Encounter (Signed)
Pt is returning Dr Perlie Mayo phone call about her results. She said to please try calling her again.

## 2017-10-21 NOTE — Telephone Encounter (Signed)
Pt returned my call, I informed her of lab result and the need to scheduled a fu with pcp. FU scheduled for next Thursday @ 230pm.

## 2017-10-21 NOTE — Telephone Encounter (Signed)
Attempted to call patient as she had question about recent blood work. Went to [facility]. Left message to call back

## 2017-10-21 NOTE — Telephone Encounter (Addendum)
Called patient to discuss.   - Creatinine has improved but not at baseline yet. Electrolytes normal  - no issues with urination  - she is going out of town on Thursday, so will recheck Wednesday  - she hasn't had issues with reflux, so we will do trial off PPI to see if we can avoid long term PPI therapy is this is not needed.

## 2017-10-22 ENCOUNTER — Other Ambulatory Visit: Payer: Self-pay

## 2017-10-22 DIAGNOSIS — N179 Acute kidney failure, unspecified: Secondary | ICD-10-CM | POA: Diagnosis not present

## 2017-10-22 NOTE — Addendum Note (Signed)
Addended by: Smiley Houseman on: 10/22/2017 08:33 AM   Modules accepted: Orders

## 2017-10-23 ENCOUNTER — Telehealth: Payer: Self-pay | Admitting: Internal Medicine

## 2017-10-23 LAB — BASIC METABOLIC PANEL
BUN / CREAT RATIO: 17 (ref 12–28)
BUN: 22 mg/dL (ref 8–27)
CO2: 25 mmol/L (ref 20–29)
CREATININE: 1.33 mg/dL — AB (ref 0.57–1.00)
Calcium: 10.3 mg/dL (ref 8.7–10.3)
Chloride: 103 mmol/L (ref 96–106)
GFR, EST AFRICAN AMERICAN: 49 mL/min/{1.73_m2} — AB (ref >59)
GFR, EST NON AFRICAN AMERICAN: 43 mL/min/{1.73_m2} — AB (ref >59)
Glucose: 145 mg/dL — ABNORMAL HIGH (ref 65–99)
Potassium: 4.2 mmol/L (ref 3.5–5.2)
SODIUM: 141 mmol/L (ref 134–144)

## 2017-10-23 NOTE — Telephone Encounter (Signed)
Please inform patient that her creatinine (kidney function) continues to improve but is not yet at baseline. I will see her in clinic next week.

## 2017-10-24 NOTE — Telephone Encounter (Signed)
Pt contacted and informed of lab result, pt was happy and appreciative. She will see [place] in clinic next week.

## 2017-10-28 ENCOUNTER — Encounter: Payer: Self-pay | Admitting: Internal Medicine

## 2017-10-28 LAB — MAGNESIUM
ALBUMIN/GLOBULIN RATIO: 1.6
ALK PHOS: 117 — AB
ALPHA-TOCOPHEROL: 13.6
ALT: 8
Ferritin: 101
Folate: 7.8
Hemoglobin A1C: 6.8 — ABNORMAL HIGH
MAGNESIUM: 2.1
PTH: 196 — ABNORMAL HIGH
TSH: 2.51
Total Protein: 6.8 g/dL
VIT D 25 HYDROXY: 23 — AB
VITAMIN A: 40.2
VITAMIN B1 (THIAMINE), BLOOD: 178.6
VITAMIN B12: 447
VITAMIN K1: 0.57
Vitamin E(Gamma Tocopherol): 2
ZINC: 73

## 2017-10-29 ENCOUNTER — Encounter: Payer: Self-pay | Admitting: Genetic Counselor

## 2017-10-29 ENCOUNTER — Inpatient Hospital Stay: Payer: Self-pay

## 2017-10-29 ENCOUNTER — Inpatient Hospital Stay: Payer: Self-pay | Attending: Genetic Counselor | Admitting: Genetic Counselor

## 2017-10-29 DIAGNOSIS — D126 Benign neoplasm of colon, unspecified: Secondary | ICD-10-CM | POA: Diagnosis not present

## 2017-10-29 DIAGNOSIS — Z800 Family history of malignant neoplasm of digestive organs: Secondary | ICD-10-CM

## 2017-10-29 NOTE — Progress Notes (Signed)
REFERRING PROVIDER: Doran Stabler, MD [address] [place], [place] [address]  PRIMARY PROVIDER:  Smiley Houseman, MD  PRIMARY REASON FOR VISIT:  1. Tubular adenoma of colon   2. Family history of colon cancer      HISTORY OF PRESENT ILLNESS:   [name], a 62 y.o. female, was seen for a [facility] cancer genetics consultation at the request of Dr. Loletha Carrow due to a personal history of colon polyps and family history of cancer.  [name] presents to clinic today to discuss the possibility of a hereditary predisposition to cancer, genetic testing, and to further clarify her future cancer risks, as well as potential cancer risks for family members.   IMs. [name] is a 62 y.o. female with no personal history of cancer.  Over the last four colonoscopies, [name] has had 10 colon polyps and 2 lipomas.  She has had colonoscopies since she was 41, and is currently on a 2 year schedule.  CANCER HISTORY:   No history exists.     HORMONAL RISK FACTORS:  Menarche was at age 70.  First live birth at age 66.  OCP use for approximately 5 years.  Ovaries intact: yes.  Hysterectomy: no.  Menopausal status: postmenopausal.  HRT use: 0 years. Colonoscopy: yes; multiple polyps, 2 lipoma found in 2007. Mammogram within the last year: yes. Number of breast biopsies: 0. Up to date with pelvic exams:  yes. Any excessive radiation exposure in the past:  no  Past Medical History:  Diagnosis Date  . Allergy   . ANEMIA, PERNICIOUS, HX OF 05/13/2007  . Anxiety   . Arthritis   . ASYMPTOMATIC POSTMENOPAUSAL STATUS 02/18/2008  . BACK PAIN, LUMBAR 11/19/2007  . Bowel incontinence   . Cataract   . Cellulitis of left lower leg 10/28/2013  . Cellulitis of leg, right 10/11/2010  . DEPRESSION, CHRONIC 10/06/2007  . Diabetes mellitus without complication ([place])   . DIABETES MELLITUS, WITH NEUROLOGICAL COMPLICATIONS 02/20/5620  . DYSLIPIDEMIA 05/13/2007  . Family history of colon cancer   .  Fecal soiling 06/28/2014  . Frozen shoulder    Lt  . GOITER, MULTINODULAR 05/13/2007  . Hx of colonic polyps 12/04/2010  . HYPERCHOLESTEROLEMIA 03/20/2010  . HYPERTENSION 03/20/2010  . Lower back pain   . NASH (nonalcoholic steatohepatitis)   . OBSTRUCTIVE SLEEP APNEA 06/12/2007   uses CPAP...has not used for 2.5 years  . PERIPHERAL NEUROPATHY 01/12/2009  . PSORIASIS 05/28/2010  . Renal insufficiency    stage 1 kd  . Sleep apnea   . UNSPECIFIED VENOUS INSUFFICIENCY 05/28/2010    Past Surgical History:  Procedure Laterality Date  . COLONOSCOPY    . ELECTROCARDIOGRAM  04/16/2006  . EXAMINATION UNDER ANESTHESIA N/A 06/29/2014   Procedure: EXAM UNDER ANESTHESIA;  Surgeon: Janyth Contes, MD;  Location: [facility];  Service: Gynecology;  Laterality: N/A;  . Exercise myoview  01/24/2005  . FLEXIBLE SIGMOIDOSCOPY    . GASTRIC BYPASS  11/09/2013  . GASTRIC BYPASS    . KNEE ARTHROSCOPY  2003   right  . LAPAROSCOPY N/A 03/12/2016   Procedure: LAPAROSCOPIC ANASTOMOSIS OF INTESTINE (ENTEROENTEROSTOMY);  Surgeon: Ladora Daniel, MD;  Location: [facility];  Service: General;  Laterality: N/A;  . LEG SURGERY     metal and pins in lower left leg  . mrsa Right    arm  . ROTATOR CUFF REPAIR  2008   right  . SHOULDER ARTHROSCOPY W/ ROTATOR CUFF REPAIR Right   .  TONSILLECTOMY    . VARICOSE VEIN SURGERY     Remotef    Social History   Socioeconomic History  . Marital status: Married    Spouse name: Marya Amsler  . Number of children: 2  . Years of education: [employer]  . Highest education level: Not on file  Occupational History  . Occupation: Unemployed    Comment: disabled  Social Needs  . Financial resource strain: Not on file  . Food insecurity:    Worry: Not on file    Inability: Not on file  . Transportation needs:    Medical: Not on file    Non-medical: Not on file  Tobacco Use  . Smoking status: Former Smoker    Packs/day: 2.00    Years: 24.00    Pack years: 48.00    Types:  Cigarettes    Start date: 06/24/1970    Last attempt to quit: 08/06/1994    Years since quitting: 23.2  . Smokeless tobacco: Never Used  Substance and Sexual Activity  . Alcohol use: No    Alcohol/week: 0.0 oz  . Drug use: No  . Sexual activity: Never  Lifestyle  . Physical activity:    Days per week: Not on file    Minutes per session: Not on file  . Stress: Not on file  Relationships  . Social connections:    Talks on phone: Not on file    Gets together: Not on file    Attends religious service: Not on file    Active member of club or organization: Not on file    Attends meetings of clubs or organizations: Not on file    Relationship status: Not on file  Other Topics Concern  . Not on file  Social History Narrative   Married '91, husband has MS, wheelchair bound   [id number] x 2, 1 son '91 8.5lbs, 1 daughter '92 10lbs      Caffeine use- coffee 3-4 cups daily     FAMILY HISTORY:  We obtained a detailed, 4-generation family history.  Significant diagnoses are listed below: Family History  Problem Relation Age of Onset  . Hyperlipidemia Father   . Hypertension Father   . Cirrhosis Father   . Colon cancer Father 42       d. 51  . Colon cancer Sister 77       d. 49  . Bipolar disorder Sister   . Aneurysm Mother        brain  . Cerebral aneurysm Mother   . Colon cancer Sister 82       d. 70  . Bipolar disorder Sister   . Cancer Paternal Aunt        NOS, ? colon  . Congestive Heart Failure Maternal Grandmother   . Drug abuse Neg Hx   . CAD Neg Hx   . Stomach cancer Neg Hx     The patient has a son and daughter who are cancer free.  She has two sisters.  One developed colon cancer at 52 and the other developed colon cancer at 50.  Both parents are deceased.  The patient's mother had a brain aneurysm at 82.  She had one sister who died in a MVA.  Both maternal grandparents are deceased from non cancer related issues.  The patient's father had colon cancer at 48  and died at 44.  He had three brothers and two sisters. One sister had an unknown form of cancer.  Both paternal grandparents are  deceased from non cancer related issues.  Ms. [name] is unaware of previous family history of genetic testing for hereditary cancer risks. Patient's ancestors are of [place] and European descent. There is no reported Ashkenazi Jewish ancestry. There is no known consanguinity.  GENETIC COUNSELING ASSESSMENT: DANYIA BORUNDA is a 62 y.o. female with a personal history of colon polyps and family history of colon cancer which is somewhat suggestive of a hereditary cancer syndrome and predisposition to cancer. We, therefore, discussed and recommended the following at today's visit.   DISCUSSION: We discussed that about 5-6% of colon cancer is hereditary with most cases due to Lynch syndrome. We discussed that genes associated with Lynch syndrome and cancers that individuals are at most risk for.  Other genes are also associated with hereditary colon cancer syndromes, including recessive genes such as NTHL1, MUTYH and MSH3.  We reviewed the characteristics, features and inheritance patterns of hereditary cancer syndromes. We also discussed genetic testing, including the appropriate family members to test, the process of testing, insurance coverage and turn-around-time for results. We discussed the implications of a negative, positive and/or variant of uncertain significant result. We recommended Ms. Sorn pursue genetic testing for the common hereditary cancer gene panel. The Hereditary Gene Panel offered by [facility] includes sequencing and/or deletion duplication testing of the following 47 genes: APC, ATM, AXIN2, BARD1, BMPR1A, BRCA1, BRCA2, BRIP1, CDH1, CDK4, CDKN2A (p14ARF), CDKN2A (p16INK4a), CHEK2, CTNNA1, DICER1, EPCAM (Deletion/duplication testing only), GREM1 (promoter region deletion/duplication testing only), KIT, MEN1, MLH1, MSH2, MSH3, MSH6, MUTYH, NBN, NF1, NHTL1, PALB2, PDGFRA,  PMS2, POLD1, POLE, PTEN, RAD50, RAD51C, RAD51D, SDHB, SDHC, SDHD, SMAD4, SMARCA4. STK11, TP53, TSC1, TSC2, and VHL.  The following genes were evaluated for sequence changes only: SDHA and HOXB13 c.251G>A variant only.   Based on Ms. Rodrigues's family history of cancer, she meets medical criteria for genetic testing. Despite that she meets criteria, she may still have an out of pocket cost. We discussed that if her out of pocket cost for testing is over $100, the laboratory will call and confirm whether she wants to proceed with testing.  If the out of pocket cost of testing is less than $100 she will be billed by the genetic testing laboratory.   In order to estimate her chance of having an MMR (Lynch syndrome) mutation, we used a Scientist, research (life sciences) (PREMM1,2,6) and laboratory data that take into account her personal medical history, family history and ancestry.  Because each model is different, there can be a lot of variability in the risks they give.  Therefore, these numbers must be considered a rough range and not a precise risk of having a MMR mutation.  This model estimates that she has approximately a 1.9% chance of having a mutation.     PLAN: After considering the risks, benefits, and limitations, Ms. Bitterman  provided informed consent to pursue genetic testing and the blood sample was sent to [facility] for analysis of the Common Hereditary Cancer Panel. Results should be available within approximately 2-3 weeks' time, at which point they will be disclosed by telephone to Ms. Kleier, as will any additional recommendations warranted by these results. Ms. Pesce will receive a summary of her genetic counseling visit and a copy of her results once available. This information will also be available in Epic. We encouraged Ms. Laviolette to remain in contact with cancer genetics annually so that we can continuously update the family history and inform her of any changes in cancer genetics and testing that  may be of benefit for her family. Ms. Dillon questions were answered to her satisfaction today. Our contact information was provided should additional questions or concerns arise.  Based on Ms. Shurley's family history, we recommended her nephews, the sons' of her sister with colon cancer, have genetic counseling and testing. Ms. Gunner will let us know if we can be of any assistance in coordinating genetic counseling and/or testing for this family member.   Lastly, we encouraged Ms. Kyllo to remain in contact with cancer genetics annually so that we can continuously update the family history and inform her of any changes in cancer genetics and testing that may be of benefit for this family.   Ms.  Milanese questions were answered to her satisfaction today. Our contact information was provided should additional questions or concerns arise. Thank you for the referral and allowing [place] to share in the care of your patient.   Karen P. Florene Glen, [place], [facility] Certified Genetic Counselor [email or web address] phone: [phone]  The patient was seen for a total of 60 minutes in face-to-face genetic counseling.  This patient was discussed with Drs. Magrinat, Lindi Adie and/or Burr Medico who agrees with the above.    _______________________________________________________________________ For Office Staff:  Number of people involved in session: 1 Was an Intern/ student involved with case: no

## 2017-10-30 ENCOUNTER — Ambulatory Visit (INDEPENDENT_AMBULATORY_CARE_PROVIDER_SITE_OTHER): Payer: Self-pay | Admitting: Internal Medicine

## 2017-10-30 ENCOUNTER — Other Ambulatory Visit: Payer: Self-pay

## 2017-10-30 ENCOUNTER — Encounter: Payer: Self-pay | Admitting: Internal Medicine

## 2017-10-30 VITALS — BP 120/70 | HR 57 | Temp 98.5°F | Wt 216.0 lb

## 2017-10-30 DIAGNOSIS — Z1239 Encounter for other screening for malignant neoplasm of breast: Secondary | ICD-10-CM

## 2017-10-30 DIAGNOSIS — E119 Type 2 diabetes mellitus without complications: Secondary | ICD-10-CM | POA: Diagnosis not present

## 2017-10-30 DIAGNOSIS — Z1231 Encounter for screening mammogram for malignant neoplasm of breast: Secondary | ICD-10-CM

## 2017-10-30 DIAGNOSIS — N179 Acute kidney failure, unspecified: Secondary | ICD-10-CM | POA: Diagnosis not present

## 2017-10-30 DIAGNOSIS — I10 Essential (primary) hypertension: Secondary | ICD-10-CM

## 2017-10-30 MED ORDER — GLUCOSE BLOOD VI STRP: ORAL_STRIP | 0 refills | Status: DC

## 2017-10-30 NOTE — Progress Notes (Signed)
   [facility] Phone: [phone]   Date of Visit: 10/30/2017   HPI: Follow Up AKI:  -Reports that she has been doing well.  Denies any issues with urination. - Her creatinine is slowly improving.  We do not know the exact  inciting event.  It is possibly due to some decreased p.o. Intake while she was on an ACE inhibitor.  Of note she has been on this ACE inhibitor for many years.  -Of note she reports that she has been really mindful of her diet.  She has been trying to eat healthy and was recently on a keto diet.   ROS: See HPI.  [place]:  PMH: HTN DM2 HLD CKD3 Nonobstructive CAD Venous Insufficiency OSA Tubular Adenoma of Colon Psoriasis Hx of Gastric Bypass   PHYSICAL EXAM: BP 120/70   Pulse (!) 57   Temp 98.5 F (36.9 C) (Oral)   Wt 216 lb (98 kg)   SpO2 97%   BMI 33.83 kg/m  GEN: NAD CV: RRR PULM: CTAB, normal effort SKIN: No rash or cyanosis; warm and well-perfused EXTR: No lower extremity edema or calf tenderness PSYCH: Mood and affect euthymic, normal rate and volume of speech NEURO: Awake, alert, no focal deficits grossly, normal speech   ASSESSMENT/PLAN:  AKI:  Remote work-up with a urinalysis and FeNa were overall unremarkable.  FeNa is consistent with intrinsic etiology.  Her creatinine has been improving on its own since we stopped ACE inhibitor and metformin when her creatinine was initially elevated.  Will obtain a BMP today  Essential hypertension Her blood pressure has been at goal since we discontinued the ACE inhibitor due to her AKI.  She is currently on Norvasc 10 mg daily and metoprolol 12.5 mg daily.  It seems that if she continues to work on her diet and exercise, she might be able to cut down on her antihypertensive medication.  To see if she would still benefit from an ACE inhibitor in the near future once her AKI has resolved, will obtain a urine microalbumin creatinine ratio.   Type 2 diabetes, controlled, with  neuropathy We have discontinued her metformin due to her AKI.  She has not been checking her CBGs because of running out of test strips.  Test strips ordered today.  Her A1c has been doing very well.  She may not need to restart her metformin.   Smiley Houseman, MD PGY [place]

## 2017-10-30 NOTE — Patient Instructions (Addendum)
1) we will get blood work today and urine 2) call GI breast center for your mammogram  3) check your sugar daily in thr morning (goal range 80-130)  4) Let's cut down on Amlodipine from 45m daily to 516mdaily (use a pill cutter) Make a nurse visit in 1 week and bring your blood pressure monitor to check your BP.

## 2017-10-31 LAB — BASIC METABOLIC PANEL
BUN / CREAT RATIO: 18 (ref 12–28)
BUN: 23 mg/dL (ref 8–27)
CHLORIDE: 104 mmol/L (ref 96–106)
CO2: 25 mmol/L (ref 20–29)
Calcium: 10.6 mg/dL — ABNORMAL HIGH (ref 8.7–10.3)
Creatinine, Ser: 1.28 mg/dL — ABNORMAL HIGH (ref 0.57–1.00)
GFR calc Af Amer: 51 mL/min/{1.73_m2} — ABNORMAL LOW (ref >59)
GFR calc non Af Amer: 45 mL/min/{1.73_m2} — ABNORMAL LOW (ref >59)
GLUCOSE: 122 mg/dL — AB (ref 65–99)
Potassium: 4.4 mmol/L (ref 3.5–5.2)
SODIUM: 142 mmol/L (ref 134–144)

## 2017-10-31 LAB — MICROALBUMIN / CREATININE URINE RATIO
Creatinine, Urine: 102.6 mg/dL
MICROALB/CREAT RATIO: 55 mg/g{creat} — AB (ref 0.0–30.0)
Microalbumin, Urine: 56.4 ug/mL

## 2017-11-02 ENCOUNTER — Encounter: Payer: Self-pay | Admitting: Internal Medicine

## 2017-11-02 NOTE — Assessment & Plan Note (Signed)
Her blood pressure has been at goal since we discontinued the ACE inhibitor due to her AKI.  She is currently on Norvasc 10 mg daily and metoprolol 12.5 mg daily.  It seems that if she continues to work on her diet and exercise, she might be able to cut down on her antihypertensive medication.  To see if she would still benefit from an ACE inhibitor in the near future once her AKI has resolved, will obtain a urine microalbumin creatinine ratio.

## 2017-11-02 NOTE — Assessment & Plan Note (Signed)
We have discontinued her metformin due to her AKI.  She has not been checking her CBGs because of running out of test strips.  Test strips ordered today.  Her A1c has been doing very well.  She may not need to restart her metformin.

## 2017-11-03 ENCOUNTER — Other Ambulatory Visit: Payer: Self-pay | Admitting: Internal Medicine

## 2017-11-03 ENCOUNTER — Telehealth: Payer: Self-pay | Admitting: Internal Medicine

## 2017-11-03 DIAGNOSIS — Z1231 Encounter for screening mammogram for malignant neoplasm of breast: Secondary | ICD-10-CM

## 2017-11-03 MED ORDER — GLUCOSE BLOOD VI STRP: ORAL_STRIP | 1 refills | Status: DC

## 2017-11-03 NOTE — Telephone Encounter (Signed)
Called patient to discuss BMP. Cr is improving. We can wait a few weeks to repeat since Cr is improving.

## 2017-11-04 ENCOUNTER — Other Ambulatory Visit: Payer: Self-pay | Admitting: *Deleted

## 2017-11-04 MED ORDER — GLUCOSE BLOOD VI STRP: ORAL_STRIP | 12 refills | Status: DC

## 2017-11-04 MED ORDER — ACCU-CHEK AVIVA PLUS W/DEVICE KIT: PACK | 0 refills | Status: DC

## 2017-11-04 MED ORDER — ACCU-CHEK SOFT TOUCH LANCETS MISC: 12 refills | Status: DC

## 2017-11-06 ENCOUNTER — Other Ambulatory Visit: Payer: Self-pay

## 2017-11-06 MED ORDER — ACCU-CHEK SOFT TOUCH LANCETS MISC: 12 refills | Status: DC

## 2017-11-06 MED ORDER — GLUCOSE BLOOD VI STRP: ORAL_STRIP | 12 refills | Status: DC

## 2017-11-06 MED ORDER — ACCU-CHEK AVIVA PLUS W/DEVICE KIT: PACK | 0 refills | Status: DC

## 2017-11-13 DIAGNOSIS — H2511 Age-related nuclear cataract, right eye: Secondary | ICD-10-CM | POA: Diagnosis not present

## 2017-11-13 DIAGNOSIS — H25811 Combined forms of age-related cataract, right eye: Secondary | ICD-10-CM | POA: Diagnosis not present

## 2017-11-18 ENCOUNTER — Telehealth: Payer: Self-pay | Admitting: Genetic Counselor

## 2017-11-18 NOTE — Telephone Encounter (Signed)
I called to let her know that I am still waiting to receive updated insurance information.  She will call me back with her card information and will drive the card up today so we can get it scanned into the system.

## 2017-11-24 ENCOUNTER — Encounter: Payer: Self-pay | Admitting: Genetic Counselor

## 2017-11-24 ENCOUNTER — Ambulatory Visit: Payer: Self-pay

## 2017-11-24 ENCOUNTER — Telehealth: Payer: Self-pay | Admitting: Genetic Counselor

## 2017-11-24 DIAGNOSIS — Z1379 Encounter for other screening for genetic and chromosomal anomalies: Secondary | ICD-10-CM

## 2017-11-24 HISTORY — DX: Encounter for other screening for genetic and chromosomal anomalies: Z13.79

## 2017-11-24 NOTE — Telephone Encounter (Signed)
LM on VM with good news.  Asked that she CB. 

## 2017-11-25 ENCOUNTER — Ambulatory Visit: Payer: Self-pay | Admitting: Genetic Counselor

## 2017-11-25 DIAGNOSIS — D126 Benign neoplasm of colon, unspecified: Secondary | ICD-10-CM

## 2017-11-25 DIAGNOSIS — Z800 Family history of malignant neoplasm of digestive organs: Secondary | ICD-10-CM

## 2017-11-25 DIAGNOSIS — Z1379 Encounter for other screening for genetic and chromosomal anomalies: Secondary | ICD-10-CM

## 2017-11-25 NOTE — Telephone Encounter (Signed)
Revealed negative genetic testing.  Discussed that we do not know why she has colon polyps or why there is cancer in the family. It could be due to a different gene that we are not testing, or maybe our current technology may not be able to pick something up.  It will be important for her to keep in contact with genetics to keep up with whether additional testing may be needed.

## 2017-11-25 NOTE — Progress Notes (Signed)
HPI:  [name] was previously seen in the [place] clinic due to a personal history of colon polyps and family history of cancer and concerns regarding a hereditary predisposition to cancer. Please refer to our prior cancer genetics clinic note for more information regarding [name]'s medical, social and family histories, and our assessment and recommendations, at the time. [name] recent genetic test results were disclosed to her, as were recommendations warranted by these results. These results and recommendations are discussed in more detail below.  CANCER HISTORY:   No history exists.    FAMILY HISTORY:  We obtained a detailed, 4-generation family history.  Significant diagnoses are listed below: Family History  Problem Relation Age of Onset  . Hyperlipidemia Father   . Hypertension Father   . Cirrhosis Father   . Colon cancer Father 50       d. 55  . Colon cancer Sister 39       d. 5  . Bipolar disorder Sister   . Aneurysm Mother        brain  . Cerebral aneurysm Mother   . Colon cancer Sister 42       d. 19  . Bipolar disorder Sister   . Cancer Paternal Aunt        NOS, ? colon  . Congestive Heart Failure Maternal Grandmother   . Drug abuse Neg Hx   . CAD Neg Hx   . Stomach cancer Neg Hx     The patient has a son and daughter who are cancer free.  She has two sisters.  One developed colon cancer at 75 and the other developed colon cancer at 51.  Both parents are deceased.  The patient's mother had a brain aneurysm at 35.  She had one sister who died in a MVA.  Both maternal grandparents are deceased from non cancer related issues.  The patient's father had colon cancer at 53 and died at 14.  He had three brothers and two sisters. One sister had an unknown form of cancer.  Both paternal grandparents are deceased from non cancer related issues.  [name] is unaware of previous family history of genetic testing for hereditary cancer risks.  Patient's ancestors are of [place] and [place] descent. There is no reported Ashkenazi Jewish ancestry. There is no known consanguinity.  GENETIC TEST RESULTS: Genetic testing reported out on Nov 21, 2017 through the Common Hereditary cancer panel found no deleterious mutations.  The Hereditary Gene Panel offered by [facility] includes sequencing and/or deletion duplication testing of the following 47 genes: APC, ATM, AXIN2, BARD1, BMPR1A, BRCA1, BRCA2, BRIP1, CDH1, CDK4, CDKN2A (p14ARF), CDKN2A (p16INK4a), CHEK2, CTNNA1, DICER1, EPCAM (Deletion/duplication testing only), GREM1 (promoter region deletion/duplication testing only), KIT, MEN1, MLH1, MSH2, MSH3, MSH6, MUTYH, NBN, NF1, NHTL1, PALB2, PDGFRA, PMS2, POLD1, POLE, PTEN, RAD50, RAD51C, RAD51D, SDHB, SDHC, SDHD, SMAD4, SMARCA4. STK11, TP53, TSC1, TSC2, and VHL.  The following genes were evaluated for sequence changes only: SDHA and HOXB13 c.251G>A variant only. The test report has been scanned into EPIC and is located under the Molecular Pathology section of the Results Review tab.    We discussed with [name] that since the current genetic testing is not perfect, it is possible there may be a gene mutation in one of these genes that current testing cannot detect, but that chance is small.  We also discussed, that it is possible that another gene that has not yet been discovered, or that we have not  yet tested, is responsible for the cancer diagnoses in the family, and it is, therefore, important to remain in touch with cancer genetics in the future so that we can continue to offer [name] the most up to date genetic testing.    CANCER SCREENING RECOMMENDATIONS: Given [name]'s personal and family histories, we must interpret these negative results with some caution.  Families with features suggestive of hereditary risk for cancer tend to have multiple family members with cancer, diagnoses in multiple generations and diagnoses before the age of 2.  [name] family exhibits some of these features. Thus this result may simply reflect our current inability to detect all mutations within these genes or there may be a different gene that has not yet been discovered or tested.   An individual's cancer risk and medical management are not determined by genetic test results alone. Overall cancer risk assessment incorporates additional factors, including personal medical history, family history, and any available genetic information that may result in a personalized plan for cancer prevention and surveillance.  This negative genetic test simply tells [place] that we cannot yet define why [name] has had an increased number of colorectal polyps. [name] medical management and screening should be based on the prospect that she will likely form more colon polyps in the future and should, therefore, undergo more frequent colonoscopy screening at intervals determined by her GI providers.  We also recommended that [name] have an upper endoscopy periodically.  RECOMMENDATIONS FOR FAMILY MEMBERS:  Women in this family might be at some increased risk of developing cancer, over the general population risk, simply due to the family history of cancer.  We recommended women in this family have a yearly mammogram beginning at age 63, or 12 years younger than the earliest onset of cancer, an annual clinical breast exam, and perform monthly breast self-exams. Women in this family should also have a gynecological exam as recommended by their primary provider. All family members should have a colonoscopy by age 46.  Based on [name]'s family history, we recommended her nephews, who's mother was diagnosed with colon cancer at age 84, have genetic counseling and testing. [name] will let us know if we can be of any assistance in coordinating genetic counseling and/or testing for this family member.   FOLLOW-UP: Lastly, we discussed with [name] that cancer genetics  is a rapidly advancing field and it is possible that new genetic tests will be appropriate for her and/or her family members in the future. We encouraged her to remain in contact with cancer genetics on an annual basis so we can update her personal and family histories and let her know of advances in cancer genetics that may benefit this family.   Our contact number was provided. [name] questions were answered to her satisfaction, and she knows she is welcome to call us at anytime with additional questions or concerns.   Roma Kayser, MS, [facility] Certified Genetic Counselor [email or web address]

## 2017-11-27 ENCOUNTER — Telehealth: Payer: Self-pay | Admitting: Gastroenterology

## 2017-11-27 NOTE — Telephone Encounter (Signed)
-----   Message from Clarene Essex, Counselor sent at 11/25/2017  1:50 PM EDT ----- Negative genetic testing.  Dr. Loletha Carrow, this patient is not sure what she is supposed to from here.  Can you have someone from your office call her and let her know what your plan is?  She would appreciate it.  Santiago Glad

## 2017-11-27 NOTE — Telephone Encounter (Signed)
Next colonoscopy May 2020, as was the original plan. Please make sure that was set after the last procedure, and inform patient.

## 2017-11-27 NOTE — Telephone Encounter (Signed)
Called and left message for patient that her next procedure is not due until May 2020. She should have received the letter that was sent out in May 2017. I let her know that I am resending this letter to her.

## 2017-12-02 NOTE — Progress Notes (Signed)
   [facility] Phone: [phone]   Date of Visit: 12/03/2017   HPI:  AKI Follow up:  - AKI noted in April 2019. At that time, we stopped Lisinopril and Metformin. Creatinine as been improving.  - no lower extremity edema - she is urinating fine  Medication Management:  - she ran out of Vitamin D supplements. We discussed that she needs to be on daily supplement as her levels were low in April.  - also discussed that she needs to be on a daily multivitamin due to her history of bariatric surgery   ROS: See HPI.  [place]:  PMH: HTN OSA DM2 with peripheral neuropathy OA Psoriasis HLD   PHYSICAL EXAM: BP (!) 142/64   Pulse (!) 57   Temp 98 F (36.7 C) (Oral)   Ht 5' 7"  (1.702 m)   Wt 216 lb (98 kg)   SpO2 99%   BMI 33.83 kg/m  GEN: NAD CV: RRR, no murmurs, rubs, or gallops PULM: CTAB, normal effort ABD: Soft, nontender, nondistended, NABS, no organomegaly SKIN: No rash or cyanosis; warm and well-perfused EXTR: No lower extremity edema or calf tenderness PSYCH: Mood and affect euthymic, normal rate and volume of speech NEURO: Awake, alert, no focal deficits grossly, normal speech   ASSESSMENT/PLAN:  Elevated Serum Creatinine:  - work up done at prior visits unremarkable. Will repeat BMP. Ideally, would like to restart Lisinopril for renal protection in the setting of DM2 and microalbuminuria.   Vitamin D Deficiency:  - recommended Vitamin D 1000 IU daily along with daily MVM with her history of bariatric surgery    Smiley Houseman, MD PGY [place]

## 2017-12-03 ENCOUNTER — Encounter: Payer: Self-pay | Admitting: Internal Medicine

## 2017-12-03 ENCOUNTER — Ambulatory Visit (INDEPENDENT_AMBULATORY_CARE_PROVIDER_SITE_OTHER): Payer: Medicare HMO | Admitting: Internal Medicine

## 2017-12-03 VITALS — BP 142/64 | HR 57 | Temp 98.0°F | Ht 67.0 in | Wt 216.0 lb

## 2017-12-03 DIAGNOSIS — E1140 Type 2 diabetes mellitus with diabetic neuropathy, unspecified: Secondary | ICD-10-CM

## 2017-12-03 DIAGNOSIS — R7989 Other specified abnormal findings of blood chemistry: Secondary | ICD-10-CM

## 2017-12-03 DIAGNOSIS — Z79899 Other long term (current) drug therapy: Secondary | ICD-10-CM

## 2017-12-03 LAB — POCT GLYCOSYLATED HEMOGLOBIN (HGB A1C): HbA1c, POC (controlled diabetic range): 6.9 % (ref 0.0–7.0)

## 2017-12-03 MED ORDER — CHOLECALCIFEROL 25 MCG (1000 UT) PO TABS: 1000.0000 [IU] | ORAL_TABLET | Freq: Every day | ORAL | 3 refills | Status: DC

## 2017-12-03 NOTE — Patient Instructions (Addendum)
1) I would recommend taking Vitamin D 1000 IU daily  2) we will check your kidney function again

## 2017-12-04 ENCOUNTER — Other Ambulatory Visit: Payer: Self-pay | Admitting: Internal Medicine

## 2017-12-04 ENCOUNTER — Telehealth: Payer: Self-pay | Admitting: Internal Medicine

## 2017-12-04 DIAGNOSIS — Z79899 Other long term (current) drug therapy: Secondary | ICD-10-CM

## 2017-12-04 DIAGNOSIS — I10 Essential (primary) hypertension: Secondary | ICD-10-CM

## 2017-12-04 LAB — BASIC METABOLIC PANEL
BUN/Creatinine Ratio: 12 (ref 12–28)
BUN: 12 mg/dL (ref 8–27)
CALCIUM: 10.8 mg/dL — AB (ref 8.7–10.3)
CO2: 24 mmol/L (ref 20–29)
CREATININE: 1.01 mg/dL — AB (ref 0.57–1.00)
Chloride: 108 mmol/L — ABNORMAL HIGH (ref 96–106)
GFR calc Af Amer: 68 mL/min/{1.73_m2} (ref >59)
GFR, EST NON AFRICAN AMERICAN: 59 mL/min/{1.73_m2} — AB (ref >59)
GLUCOSE: 146 mg/dL — AB (ref 65–99)
Potassium: 4.4 mmol/L (ref 3.5–5.2)
SODIUM: 145 mmol/L — AB (ref 134–144)

## 2017-12-04 MED ORDER — LISINOPRIL 5 MG PO TABS: 2.5000 mg | ORAL_TABLET | Freq: Every day | ORAL | 3 refills | Status: DC

## 2017-12-04 NOTE — Progress Notes (Signed)
BMP ordered

## 2017-12-04 NOTE — Telephone Encounter (Signed)
Called patient to discuss BMP. Cr is essentially back to baseline. We decided to hold of on restarting Metformin. She would benefit from ACE inhibitor. We decided to start lisinopril 2.53m daily. We also decided to decrease Norvasc from 114mto 42m3maily since her blood pressure was low-normal yesterday. Patient to make lab visit in 1 week after starting the new regimen.

## 2017-12-12 DIAGNOSIS — E669 Obesity, unspecified: Secondary | ICD-10-CM | POA: Diagnosis not present

## 2017-12-12 DIAGNOSIS — Z9884 Bariatric surgery status: Secondary | ICD-10-CM | POA: Diagnosis not present

## 2017-12-12 DIAGNOSIS — Z713 Dietary counseling and surveillance: Secondary | ICD-10-CM | POA: Diagnosis not present

## 2017-12-12 DIAGNOSIS — I10 Essential (primary) hypertension: Secondary | ICD-10-CM | POA: Diagnosis not present

## 2017-12-12 DIAGNOSIS — K219 Gastro-esophageal reflux disease without esophagitis: Secondary | ICD-10-CM | POA: Diagnosis not present

## 2017-12-12 DIAGNOSIS — E119 Type 2 diabetes mellitus without complications: Secondary | ICD-10-CM | POA: Diagnosis not present

## 2017-12-12 DIAGNOSIS — E7849 Other hyperlipidemia: Secondary | ICD-10-CM | POA: Diagnosis not present

## 2017-12-12 DIAGNOSIS — N181 Chronic kidney disease, stage 1: Secondary | ICD-10-CM | POA: Diagnosis not present

## 2017-12-12 DIAGNOSIS — Z6833 Body mass index (BMI) 33.0-33.9, adult: Secondary | ICD-10-CM | POA: Diagnosis not present

## 2017-12-13 ENCOUNTER — Telehealth: Payer: Self-pay | Admitting: Family Medicine

## 2017-12-13 NOTE — Telephone Encounter (Signed)
**  After Hours/ Emergency Line Call**  Received a call to report that Saba L Keithly having trouble with R leg. Has had trouble with R leg for years, had been going to wound center for years and unna boot. No wound for awhile. Yesterday noticed some redness and today redness spread to around knee. No fever/chills, nausea, shortness of breath. Some LE swelling on R leg and feels warm. Recommended that given the fast spread that she should be evaluated in the ER. Patient prefers to monitor over the next couple of hours since she has no systemic symptoms and she has to take care of her wheelchair bound husband.  Red flags discussed including but not limited to fever/chills, nausea, worsening redness/swelling/pain. Patient voiced good understanding that my recommendation was to be evaluated in ER today. She stated that she would mostly like go to ER in a couple of hours but requested that if she prefers to monitor at home, she would like an appt with her PCP Monday. Appt scheduled for 6/24 at 11:10am and patient informed.   Will forward to PCP.  Bufford Lope, DO PGY-2, [facility] 12/13/2017 9:04 AM

## 2017-12-15 ENCOUNTER — Encounter: Payer: Self-pay | Admitting: Internal Medicine

## 2017-12-15 ENCOUNTER — Ambulatory Visit (INDEPENDENT_AMBULATORY_CARE_PROVIDER_SITE_OTHER): Payer: Medicare HMO | Admitting: Internal Medicine

## 2017-12-15 ENCOUNTER — Other Ambulatory Visit: Payer: Self-pay

## 2017-12-15 VITALS — BP 134/64 | HR 78 | Temp 98.1°F | Wt 220.0 lb

## 2017-12-15 DIAGNOSIS — M7989 Other specified soft tissue disorders: Secondary | ICD-10-CM

## 2017-12-15 DIAGNOSIS — L03119 Cellulitis of unspecified part of limb: Secondary | ICD-10-CM | POA: Diagnosis not present

## 2017-12-15 MED ORDER — CLINDAMYCIN HCL 300 MG PO CAPS: 300.0000 mg | ORAL_CAPSULE | Freq: Four times a day (QID) | ORAL | 0 refills | Status: DC

## 2017-12-15 NOTE — Patient Instructions (Signed)
Take Clindamycin for your skin infection.  Please return to clinic Thursday or Friday for follow up  We ordered an ultrasound of your leg.

## 2017-12-15 NOTE — Progress Notes (Signed)
   [facility] Phone: [phone]   Date of Visit: 12/15/2017   HPI:  Right Leg Redness/Swelling:  - her nutritionist first noticed the redness on her right lower extremity this past Friday.  - patient reports the redness increased/spread over the weekend - denies pain but the area feels sore and warm to touch  - no calf pain - tried icing the area  - no fevers or chills  ROS: See HPI.  [place]:  PMH: HTN OSA DM2 with peripheral neuropathy OA Psoriasis HLD  PHYSICAL EXAM: BP 134/64   Pulse 78   Temp 98.1 F (36.7 C) (Oral)   Wt 220 lb (99.8 kg)   SpO2 98%   BMI 34.46 kg/m  GEN: NAD  CV: RRR, no murmurs, rubs, or gallops PULM: CTAB, normal effort EXTR: right lower extremity: erythema that is well demarcated that starts around her ankle and extends to the mid shin. There are no fluctuant areas. There is some pitting edema overlying the same area. No calf pain to palpation. Normal dorsalis pedis pulse. Foot is well perfused. There are some varicose veins noted on the lower extremity.         PSYCH: Mood and affect euthymic, normal rate and volume of speech NEURO: Awake, alert, no focal deficits grossly, normal speech   ASSESSMENT/PLAN:  1. Cellulitis of lower leg Symptoms and exam are most consistent with nonpurulent cellulitis. Patient had a allergy to Keflex. Therefore, will treat with alternate antibiotic Clindamycin 348m QID for 5 days. Would like to rule out DVT although less likely. Lower extremity venous [place]. Follow up in clinic this Thursday or Friday for re-evaluation.   KSmiley Houseman MD PGY [place]

## 2017-12-16 ENCOUNTER — Encounter: Payer: Self-pay | Admitting: Internal Medicine

## 2017-12-16 ENCOUNTER — Ambulatory Visit: Payer: Self-pay

## 2017-12-16 ENCOUNTER — Telehealth: Payer: Self-pay | Admitting: *Deleted

## 2017-12-16 ENCOUNTER — Ambulatory Visit (HOSPITAL_COMMUNITY)
Admission: RE | Admit: 2017-12-16 | Discharge: 2017-12-16 | Disposition: A | Discharge status: Home / Self Care | Payer: Medicare HMO | Source: Ambulatory Visit | Attending: Family Medicine | Admitting: Family Medicine

## 2017-12-16 DIAGNOSIS — M7989 Other specified soft tissue disorders: Secondary | ICD-10-CM | POA: Insufficient documentation

## 2017-12-16 NOTE — Telephone Encounter (Signed)
Marya Amsler called from the vascular lab at [facility] to report that patient's leg is negative for any DVT.  Will forward to MD to make aware. Jazmin Hartsell,CMA

## 2017-12-16 NOTE — Progress Notes (Signed)
Right lower extremity venous duplex has been completed. Negative for DVT. Results were given to [place] at Dr. Macario Golds office.  12/16/17 4:11 PM [name] RVT

## 2017-12-16 NOTE — Telephone Encounter (Signed)
Noted  

## 2017-12-16 NOTE — Telephone Encounter (Signed)
Attempted to call patient to report of Doppler negative for DVT. Went to [facility]. Left message stating that I will send her a mychart message and to call if she has any questions.

## 2017-12-19 ENCOUNTER — Encounter: Payer: Self-pay | Admitting: Family Medicine

## 2017-12-19 ENCOUNTER — Ambulatory Visit (INDEPENDENT_AMBULATORY_CARE_PROVIDER_SITE_OTHER): Payer: Medicare HMO | Admitting: Family Medicine

## 2017-12-19 ENCOUNTER — Other Ambulatory Visit: Payer: Self-pay

## 2017-12-19 VITALS — BP 132/74 | HR 58 | Ht 67.0 in | Wt 218.0 lb

## 2017-12-19 DIAGNOSIS — L03119 Cellulitis of unspecified part of limb: Secondary | ICD-10-CM | POA: Diagnosis not present

## 2017-12-19 MED ORDER — CLINDAMYCIN HCL 300 MG PO CAPS: 300.0000 mg | ORAL_CAPSULE | Freq: Four times a day (QID) | ORAL | 0 refills | Status: AC

## 2017-12-19 NOTE — Assessment & Plan Note (Signed)
Pt cellulitis is improving on clindamycin. Dopplers neg for dvt. Given h/o complex wound in same area, pt likely had poor wound healting due to extensive venous disease. Will extend clindamycin course for additional 5 days and have pt return in 2 weeks. Strict return precautions given.

## 2017-12-19 NOTE — Patient Instructions (Signed)
It was a pleasure to see you today! Thank you for choosing [facility] for your primary care. [name] was seen for follow-up on your cellulitis.  It is.  We will prescribe you 5 more days of clindamycin.  Come back in 2 weeks for follow-up.  Best,  Marny Lowenstein, MD, [place] - PGY1 12/19/2017 3:17 PM

## 2017-12-19 NOTE — Progress Notes (Signed)
    Subjective:  [name] is a 62 y.o. female who presents to the [facility] today with a follow up on leg cellulitis.   HPI:  Leg Cellulitis Pt presents for f/u for right lower extremity cellulitis. Pt was placed on clindamycin 300 qid for 5 days. Leg redness and pain are improving. Pt has complex history of leg wound in the same spot. Approximately 40 years ago, pt had "vein stripping" that lead to chronic leg wound and "bone infection" requiring surgery with retained staples. Pt required wound care with unaboot for several years after that. Again 5 years ago, pt developed right lower extremity wound with "extensive bleeding" requiring surgery and wound care. Pt has history of chronic verucose veins on right leg. Denies fevers, chills, worsening pain or redness or swelling.   Objective:  Physical Exam: BP 132/74   Pulse (!) 58   Ht 5' 7"  (1.702 m)   Wt 218 lb (98.9 kg)   SpO2 98%   BMI 34.14 kg/m   Gen: NAD, resting comfortably CV: RRR with no murmurs appreciated Pulm: NWOB, CTAB with no crackles, wheezes, or rhonchi GI: Normal bowel sounds present. Soft, Nontender, Nondistended. Extremity: 2+ dp bilat, right leg erythematous, ttp, mild surrounding edema, no drainage, various veins throughout upper an lower right leg, eczema, appearance of chronic venus stasis dermatitis with skin darkening Extremity:     No results found for this or any previous visit (from the past 72 hour(s)).   Assessment/Plan:  Cellulitis of lower leg Pt cellulitis is improving on clindamycin. Dopplers neg for dvt. Given h/o complex wound in same area, pt likely had poor wound healting due to extensive venous disease. Will extend clindamycin course for additional 5 days and have pt return in 2 weeks. Strict return precautions given.    Lab Orders  No laboratory test(s) ordered today    Meds ordered this encounter  Medications  . clindamycin (CLEOCIN) 300 MG capsule    Sig: Take 1 capsule (300 mg  total) by mouth 4 (four) times daily for 5 days.    Dispense:  20 capsule    Refill:  0    Marny Lowenstein, MD, MS FAMILY MEDICINE RESIDENT - PGY1 12/19/2017 5:07 PM

## 2018-01-02 ENCOUNTER — Encounter: Payer: Self-pay | Admitting: Family Medicine

## 2018-01-02 ENCOUNTER — Other Ambulatory Visit: Payer: Self-pay

## 2018-01-02 ENCOUNTER — Ambulatory Visit (INDEPENDENT_AMBULATORY_CARE_PROVIDER_SITE_OTHER): Payer: Medicare HMO | Admitting: Family Medicine

## 2018-01-02 VITALS — BP 124/78 | HR 51 | Temp 98.4°F | Ht 67.0 in

## 2018-01-02 DIAGNOSIS — I872 Venous insufficiency (chronic) (peripheral): Secondary | ICD-10-CM

## 2018-01-02 DIAGNOSIS — L408 Other psoriasis: Secondary | ICD-10-CM

## 2018-01-02 HISTORY — DX: Venous insufficiency (chronic) (peripheral): I87.2

## 2018-01-02 MED ORDER — HYDROCORTISONE 1 % EX OINT: 1.0000 "application " | TOPICAL_OINTMENT | Freq: Two times a day (BID) | CUTANEOUS | 0 refills | Status: DC

## 2018-01-02 NOTE — Patient Instructions (Signed)

## 2018-01-02 NOTE — Assessment & Plan Note (Signed)
Right leg cellulitis seems to have resolved.  Patient seems to have chronic venous stasis dermatitis with chronic skin changes.  Recommend patient wear compression stockings.  Will also write a referral to vascular surgery she has had multiple surgeries and traumas to that leg and may require further work-up.

## 2018-01-02 NOTE — Progress Notes (Signed)
    Subjective:  [name] is a 62 y.o. female who presents to the [facility] today to follow-up on lower extremity cellulitis.   HPI: Cellulitis Per patient seems a little improved.  She is not finished her course of antibiotics, so she has about 4 or 5 doses left.  Still endorses pain in that area, denies any fevers, chills, drainage from the wound.  Says her swelling is not getting worse.  Denies any shortness of breath or chest pain.  ROS: Per HPI  Objective:  Physical Exam: BP 124/78   Pulse (!) 51   Temp 98.4 F (36.9 C) (Oral)   Ht 5' 7"  (1.702 m)   SpO2 97%   BMI 34.14 kg/m   Gen: NAD, resting comfortably CV: RRR with no murmurs appreciated Pulm: NWOB, CTAB with no crackles, wheezes, or rhonchi GI: Normal bowel sounds present. Soft, Nontender, Nondistended. MSK: Bilateral lower extremity edema 2+, 2+ dorsalis pedis, multiple varicose veins on right leg, no open ulcer on the right leg, mildly tender right leg over area, not warm Skin: Small erythematous patches on upper extremity, without scaling described as pruritic by patient with trauma evidenced by multiple excoriations Neuro: grossly normal, moves all extremities Psych: Normal affect and thought content  No results found for this or any previous visit (from the past 72 hour(s)).   Assessment/Plan:  Venous stasis dermatitis of right lower extremity Right leg cellulitis seems to have resolved.  Patient seems to have chronic venous stasis dermatitis with chronic skin changes.  Recommend patient wear compression stockings.  Will also write a referral to vascular surgery she has had multiple surgeries and traumas to that leg and may require further work-up.  PSORIASIS Patient has history of psoriasis.  Seems to have untreated lesions.  Will try low potency topical steroid as needed.  Patient follow-up as needed   Lab Orders  No laboratory test(s) ordered today    Meds ordered this encounter  Medications  .  hydrocortisone 1 % ointment    Sig: Apply 1 application topically 2 (two) times daily.    Dispense:  30 g    Refill:  0    Marny Lowenstein, MD, MS FAMILY MEDICINE RESIDENT - PGY1 01/02/2018 4:29 PM

## 2018-01-02 NOTE — Assessment & Plan Note (Signed)
Patient has history of psoriasis.  Seems to have untreated lesions.  Will try low potency topical steroid as needed.  Patient follow-up as needed

## 2018-01-05 ENCOUNTER — Ambulatory Visit: Payer: Self-pay

## 2018-01-07 ENCOUNTER — Other Ambulatory Visit: Payer: Self-pay

## 2018-01-07 DIAGNOSIS — L03119 Cellulitis of unspecified part of limb: Secondary | ICD-10-CM

## 2018-01-07 DIAGNOSIS — Z6833 Body mass index (BMI) 33.0-33.9, adult: Secondary | ICD-10-CM | POA: Diagnosis not present

## 2018-01-07 DIAGNOSIS — Z9884 Bariatric surgery status: Secondary | ICD-10-CM | POA: Diagnosis not present

## 2018-01-26 ENCOUNTER — Ambulatory Visit (INDEPENDENT_AMBULATORY_CARE_PROVIDER_SITE_OTHER): Payer: Medicare HMO | Admitting: Family Medicine

## 2018-01-26 ENCOUNTER — Other Ambulatory Visit: Payer: Self-pay

## 2018-01-26 ENCOUNTER — Encounter: Payer: Self-pay | Admitting: Family Medicine

## 2018-01-26 VITALS — BP 142/82 | HR 59 | Temp 98.4°F | Ht 67.0 in | Wt 210.0 lb

## 2018-01-26 DIAGNOSIS — J181 Lobar pneumonia, unspecified organism: Secondary | ICD-10-CM

## 2018-01-26 DIAGNOSIS — J189 Pneumonia, unspecified organism: Secondary | ICD-10-CM

## 2018-01-26 MED ORDER — BENZONATATE 200 MG PO CAPS: 200.0000 mg | ORAL_CAPSULE | Freq: Two times a day (BID) | ORAL | 0 refills | Status: DC | PRN

## 2018-01-26 MED ORDER — AZITHROMYCIN 250 MG PO TABS: ORAL_TABLET | ORAL | 0 refills | Status: AC

## 2018-01-26 NOTE — Progress Notes (Addendum)
   Subjective   Patient ID: SOPHIYA MORELLO    DOB: 19-Aug-1955, 62 y.o. female   MRN: [id number]  CC: "Cough"  HPI: JAPNEET STAGGS is a 62 y.o. female who presents for a same day appointment for the following:  URI  Has been sick for 7 days. Nasal discharge: minimal Medications tried: NightQuil, Thermaflu, DayQuil, advil Sick contacts: no, was recently in [place]  Symptoms Fever: 101.57F Cough: productive with some purulence Headache or face pain: some tension headache with cough Tooth pain: no Sneezing: no Scratchy throat: no Allergies: no Muscle aches: no Severe fatigue: no, mild fatigue Stiff neck: no Shortness of breath: no Rash: no Sore throat or swollen glands: yes  Patient has a significant back history from age [age] with 1-1.5 packs/day but quit approximately 23 years ago.  ROS: see HPI for pertinent.  [place]: T2DM with neuropathy, OSA, venous insufficiency, CAD, psoriasis, rectovaginal fistula, iron deficiency anemia, multinodular goiter, HTN.  Surgical history varicose vein, tonsillectomy, right rotator cuff, leg surgery, gastric bypass, Myoview.  Family history aneurysm, cirrhosis, colon cancer (father, 41) area smoking status reviewed. Medications reviewed.  Objective   BP (!) 142/82   Pulse (!) 59   Temp 98.4 F (36.9 C) (Oral)   Ht 5' 7"  (1.702 m)   Wt 210 lb (95.3 kg)   SpO2 91%   BMI 32.89 kg/m  Vitals and nursing note reviewed.  General: tired appearing female, well nourished, well developed, NAD with non-toxic appearance HEENT: normocephalic, atraumatic, moist mucous membranes Neck: supple, non-tender without lymphadenopathy Cardiovascular: regular rate and rhythm without murmurs, rubs, or gallops Lungs: rales on LLL without egonphony with normal work of breathing Skin: warm, dry, no rashes or lesions, cap refill < 2 seconds Extremities: warm and well perfused, normal tone, no edema  Assessment & Plan   CAP (community acquired pneumonia) Acute.   Localized to left lower lobe.  Likely viral though there are concerns for bacterial given history of purulent sputum, recorded fevers and recent historical CAP back in February.  Patient is a former smoker.  We discussed conservative management versus treatment with antibiotics given patient's clinical presentation. - Given azithromycin Dosepak and Tessalon 200 mg twice daily as needed - Reviewed return precautions - Patient does had a 30-pack-year history though she does not meet criteria for LDCT given cessation for approximately 20 years  No orders of the defined types were placed in this encounter.  Meds ordered this encounter  Medications  . benzonatate (TESSALON) 200 MG capsule    Sig: Take 1 capsule (200 mg total) by mouth 2 (two) times daily as needed for cough.    Dispense:  20 capsule    Refill:  0  . azithromycin (ZITHROMAX) 250 MG tablet    Sig: Take 2 tabs by mouth on day 1, then 1 tab by mouth daily    Dispense:  6 each    Refill:  0    Harriet Butte, DO [place], PGY-3 01/27/2018, 9:15 AM

## 2018-01-26 NOTE — Patient Instructions (Signed)
Thank you for coming in to see [place] today. Please see below to review our plan for today's visit.  Your symptoms are likely from a viral infection and you do have symptoms that may be related to pneumonia.  Because of this, I have given you a 5-day course of azithromycin antibiotic.  Take as instructed.  As for your cough, this may take much longer to resolve. I have given you Tessalon for the cough. You can also take a spoonful of honey three times daily. Drink plenty of water.  Return to the clinic if your symptoms worsen.  Please call the clinic at [phone] if your symptoms worsen or you have any concerns. It was our pleasure to serve you.  Harriet Butte, [place], PGY-3

## 2018-01-27 ENCOUNTER — Ambulatory Visit: Payer: Self-pay

## 2018-01-27 NOTE — Assessment & Plan Note (Addendum)
Acute.  Localized to left lower lobe.  Likely viral though there are concerns for bacterial given history of purulent sputum, recorded fevers and recent historical CAP back in February.  Patient is a former smoker.  We discussed conservative management versus treatment with antibiotics given patient's clinical presentation. - Given azithromycin Dosepak and Tessalon 200 mg twice daily as needed - Reviewed return precautions - Patient does had a 30-pack-year history though she does not meet criteria for LDCT given cessation for approximately 20 years

## 2018-01-28 DIAGNOSIS — G4733 Obstructive sleep apnea (adult) (pediatric): Secondary | ICD-10-CM | POA: Diagnosis not present

## 2018-01-28 DIAGNOSIS — R1084 Generalized abdominal pain: Secondary | ICD-10-CM | POA: Diagnosis not present

## 2018-01-28 DIAGNOSIS — I10 Essential (primary) hypertension: Secondary | ICD-10-CM | POA: Diagnosis not present

## 2018-01-28 DIAGNOSIS — Z01818 Encounter for other preprocedural examination: Secondary | ICD-10-CM | POA: Diagnosis not present

## 2018-01-28 DIAGNOSIS — Z9884 Bariatric surgery status: Secondary | ICD-10-CM | POA: Diagnosis not present

## 2018-01-28 DIAGNOSIS — K439 Ventral hernia without obstruction or gangrene: Secondary | ICD-10-CM | POA: Diagnosis not present

## 2018-02-05 DIAGNOSIS — G4733 Obstructive sleep apnea (adult) (pediatric): Secondary | ICD-10-CM | POA: Diagnosis not present

## 2018-02-05 DIAGNOSIS — M1990 Unspecified osteoarthritis, unspecified site: Secondary | ICD-10-CM | POA: Diagnosis not present

## 2018-02-05 DIAGNOSIS — Z9884 Bariatric surgery status: Secondary | ICD-10-CM | POA: Diagnosis not present

## 2018-02-05 DIAGNOSIS — I340 Nonrheumatic mitral (valve) insufficiency: Secondary | ICD-10-CM | POA: Diagnosis not present

## 2018-02-05 DIAGNOSIS — I129 Hypertensive chronic kidney disease with stage 1 through stage 4 chronic kidney disease, or unspecified chronic kidney disease: Secondary | ICD-10-CM | POA: Diagnosis not present

## 2018-02-05 DIAGNOSIS — I2510 Atherosclerotic heart disease of native coronary artery without angina pectoris: Secondary | ICD-10-CM | POA: Diagnosis not present

## 2018-02-05 DIAGNOSIS — G547 Phantom limb syndrome without pain: Secondary | ICD-10-CM | POA: Diagnosis not present

## 2018-02-05 DIAGNOSIS — K6389 Other specified diseases of intestine: Secondary | ICD-10-CM | POA: Diagnosis not present

## 2018-02-05 DIAGNOSIS — E1140 Type 2 diabetes mellitus with diabetic neuropathy, unspecified: Secondary | ICD-10-CM | POA: Diagnosis not present

## 2018-02-05 DIAGNOSIS — R109 Unspecified abdominal pain: Secondary | ICD-10-CM | POA: Diagnosis not present

## 2018-02-05 DIAGNOSIS — K660 Peritoneal adhesions (postprocedural) (postinfection): Secondary | ICD-10-CM | POA: Diagnosis not present

## 2018-02-05 DIAGNOSIS — F418 Other specified anxiety disorders: Secondary | ICD-10-CM | POA: Diagnosis not present

## 2018-02-05 DIAGNOSIS — E785 Hyperlipidemia, unspecified: Secondary | ICD-10-CM | POA: Diagnosis not present

## 2018-02-05 DIAGNOSIS — K439 Ventral hernia without obstruction or gangrene: Secondary | ICD-10-CM

## 2018-02-05 DIAGNOSIS — R1084 Generalized abdominal pain: Secondary | ICD-10-CM

## 2018-02-05 DIAGNOSIS — I10 Essential (primary) hypertension: Secondary | ICD-10-CM | POA: Diagnosis not present

## 2018-02-05 DIAGNOSIS — E1142 Type 2 diabetes mellitus with diabetic polyneuropathy: Secondary | ICD-10-CM | POA: Diagnosis not present

## 2018-02-05 DIAGNOSIS — N181 Chronic kidney disease, stage 1: Secondary | ICD-10-CM | POA: Diagnosis not present

## 2018-02-05 DIAGNOSIS — K429 Umbilical hernia without obstruction or gangrene: Secondary | ICD-10-CM | POA: Diagnosis not present

## 2018-02-05 DIAGNOSIS — E669 Obesity, unspecified: Secondary | ICD-10-CM | POA: Diagnosis not present

## 2018-02-05 DIAGNOSIS — K219 Gastro-esophageal reflux disease without esophagitis: Secondary | ICD-10-CM | POA: Diagnosis not present

## 2018-02-05 DIAGNOSIS — R001 Bradycardia, unspecified: Secondary | ICD-10-CM | POA: Diagnosis not present

## 2018-02-05 DIAGNOSIS — E860 Dehydration: Secondary | ICD-10-CM | POA: Diagnosis not present

## 2018-02-05 DIAGNOSIS — K449 Diaphragmatic hernia without obstruction or gangrene: Secondary | ICD-10-CM | POA: Diagnosis not present

## 2018-02-05 DIAGNOSIS — E1122 Type 2 diabetes mellitus with diabetic chronic kidney disease: Secondary | ICD-10-CM | POA: Diagnosis not present

## 2018-02-05 HISTORY — DX: Generalized abdominal pain: R10.84

## 2018-02-05 HISTORY — DX: Ventral hernia without obstruction or gangrene: K43.9

## 2018-02-09 ENCOUNTER — Other Ambulatory Visit: Payer: Self-pay | Admitting: Internal Medicine

## 2018-02-09 DIAGNOSIS — E7800 Pure hypercholesterolemia, unspecified: Secondary | ICD-10-CM

## 2018-02-13 DIAGNOSIS — I10 Essential (primary) hypertension: Secondary | ICD-10-CM | POA: Diagnosis not present

## 2018-02-13 DIAGNOSIS — R001 Bradycardia, unspecified: Secondary | ICD-10-CM | POA: Diagnosis not present

## 2018-02-13 DIAGNOSIS — G4733 Obstructive sleep apnea (adult) (pediatric): Secondary | ICD-10-CM | POA: Diagnosis not present

## 2018-02-13 DIAGNOSIS — G4710 Hypersomnia, unspecified: Secondary | ICD-10-CM | POA: Diagnosis not present

## 2018-02-19 ENCOUNTER — Ambulatory Visit: Payer: Medicare HMO | Admitting: Family Medicine

## 2018-02-25 ENCOUNTER — Ambulatory Visit (INDEPENDENT_AMBULATORY_CARE_PROVIDER_SITE_OTHER): Payer: Medicare HMO

## 2018-02-25 ENCOUNTER — Ambulatory Visit
Admission: RE | Admit: 2018-02-25 | Discharge: 2018-02-25 | Disposition: A | Discharge status: Home / Self Care | Payer: Medicare HMO | Source: Ambulatory Visit | Attending: Family Medicine | Admitting: Family Medicine

## 2018-02-25 VITALS — BP 166/72

## 2018-02-25 DIAGNOSIS — Z1231 Encounter for screening mammogram for malignant neoplasm of breast: Secondary | ICD-10-CM

## 2018-02-25 DIAGNOSIS — Z79899 Other long term (current) drug therapy: Secondary | ICD-10-CM

## 2018-02-25 NOTE — Progress Notes (Signed)
   Patient walked in just asking for her BP to be checked before her mammogram next door. BP 166/72 which is good for her. No other questions or concerns. Has upcoming appt with PCP.  Danley Danker, RN [facility] RN)

## 2018-03-03 ENCOUNTER — Encounter: Payer: Self-pay | Admitting: Vascular Surgery

## 2018-03-03 ENCOUNTER — Ambulatory Visit (INDEPENDENT_AMBULATORY_CARE_PROVIDER_SITE_OTHER): Payer: Medicare HMO | Admitting: Vascular Surgery

## 2018-03-03 ENCOUNTER — Other Ambulatory Visit: Payer: Self-pay

## 2018-03-03 ENCOUNTER — Ambulatory Visit (HOSPITAL_COMMUNITY)
Admission: RE | Admit: 2018-03-03 | Discharge: 2018-03-03 | Disposition: A | Discharge status: Home / Self Care | Payer: Medicare HMO | Source: Ambulatory Visit | Attending: Vascular Surgery | Admitting: Vascular Surgery

## 2018-03-03 VITALS — BP 200/71 | HR 48 | Temp 97.3°F | Resp 16 | Ht 67.0 in | Wt 214.0 lb

## 2018-03-03 DIAGNOSIS — L03119 Cellulitis of unspecified part of limb: Secondary | ICD-10-CM | POA: Insufficient documentation

## 2018-03-03 DIAGNOSIS — M7989 Other specified soft tissue disorders: Secondary | ICD-10-CM

## 2018-03-03 NOTE — Progress Notes (Signed)
Vascular and Vein Specialist of [facility]  Patient name: [name] MRN: [id number] DOB: 04-19-1956 Sex: female  REASON FOR CONSULT: Evaluation of right leg venous pathology  HPI: [name] is a 62 y.o. female, who is her today for evaluation of her right leg swelling and edema.  She has a very long history of prior venous disease.  She reports right leg vein stripping in her 20s nearly 40 years ago.  She had also had episodes of sclerotherapy at [facility].  She has had multiple episodes of cellulitis in her right lower extremity.  Also reports ulceration and treatment with Unna boots.  Over the past several years she has had at least 2 episodes of significant bleeding from telangiectasia in the same areas around her distal pretibial area.  She has worn knee-high compression in the past.  She does have marked varicosities over her anterior calf and also anterior thigh.  No history of DVT  Past Medical History:  Diagnosis Date  . Allergy   . ANEMIA, PERNICIOUS, HX OF 05/13/2007  . Anxiety   . Arthritis   . ASYMPTOMATIC POSTMENOPAUSAL STATUS 02/18/2008  . BACK PAIN, LUMBAR 11/19/2007  . Bowel incontinence   . Cataract   . Cellulitis of left lower leg 10/28/2013  . Cellulitis of leg, right 10/11/2010  . DEPRESSION, CHRONIC 10/06/2007  . Diabetes mellitus without complication ([place])   . DIABETES MELLITUS, WITH NEUROLOGICAL COMPLICATIONS 10/10/6220  . DYSLIPIDEMIA 05/13/2007  . Family history of colon cancer   . Fecal soiling 06/28/2014  . Frozen shoulder    Lt  . GOITER, MULTINODULAR 05/13/2007  . Hx of colonic polyps 12/04/2010  . HYPERCHOLESTEROLEMIA 03/20/2010  . HYPERTENSION 03/20/2010  . Lower back pain   . NASH (nonalcoholic steatohepatitis)   . OBSTRUCTIVE SLEEP APNEA 06/12/2007   uses CPAP...has not used for 2.5 years  . PERIPHERAL NEUROPATHY 01/12/2009  . PSORIASIS 05/28/2010  . Renal insufficiency    stage 1 kd  . Sleep apnea     . UNSPECIFIED VENOUS INSUFFICIENCY 05/28/2010    Family History  Problem Relation Age of Onset  . Hyperlipidemia Father   . Hypertension Father   . Cirrhosis Father   . Colon cancer Father 42       d. 54  . Colon cancer Sister 78       d. 38  . Bipolar disorder Sister   . Aneurysm Mother        brain  . Cerebral aneurysm Mother   . Colon cancer Sister 104       d. 22  . Bipolar disorder Sister   . Cancer Paternal Aunt        NOS, ? colon  . Congestive Heart Failure Maternal Grandmother   . Drug abuse Neg Hx   . CAD Neg Hx   . Stomach cancer Neg Hx     SOCIAL HISTORY: Social History   Socioeconomic History  . Marital status: Married    Spouse name: Marya Amsler  . Number of children: 2  . Years of education: [employer]  . Highest education level: Not on file  Occupational History  . Occupation: Unemployed    Comment: disabled  Social Needs  . Financial resource strain: Not on file  . Food insecurity:    Worry: Not on file    Inability: Not on file  . Transportation needs:    Medical: Not on file    Non-medical: Not on file  Tobacco Use  .  Smoking status: Former Smoker    Packs/day: 2.00    Years: 24.00    Pack years: 48.00    Types: Cigarettes    Start date: 06/24/1970    Last attempt to quit: 08/06/1994    Years since quitting: 23.5  . Smokeless tobacco: Never Used  Substance and Sexual Activity  . Alcohol use: No    Alcohol/week: 0.0 standard drinks  . Drug use: No  . Sexual activity: Never  Lifestyle  . Physical activity:    Days per week: Not on file    Minutes per session: Not on file  . Stress: Not on file  Relationships  . Social connections:    Talks on phone: Not on file    Gets together: Not on file    Attends religious service: Not on file    Active member of club or organization: Not on file    Attends meetings of clubs or organizations: Not on file    Relationship status: Not on file  . Intimate partner violence:    Fear of current or ex  partner: Not on file    Emotionally abused: Not on file    Physically abused: Not on file    Forced sexual activity: Not on file  Other Topics Concern  . Not on file  Social History Narrative   Married '91, husband has MS, wheelchair bound   [id number] x 2, 1 son '91 8.5lbs, 1 daughter '92 10lbs      Caffeine use- coffee 3-4 cups daily    Allergies  Allergen Reactions  . Cephalexin Hives and Rash    Denies Airway involvement  . Latex Rash  . Neomycin-Bacitracin Zn-Polymyx Rash  . Tape Rash    Latex Prefers paper tape    Current Outpatient Medications  Medication Sig Dispense Refill  . amLODipine (NORVASC) 10 MG tablet Take 1 tablet (10 mg total) by mouth daily. 90 tablet 1  . aspirin 81 MG tablet Take 1 tablet (81 mg total) by mouth daily. 90 tablet 0  . atorvastatin (LIPITOR) 40 MG tablet TAKE 1 TABLET EVERY DAY 90 tablet 0  . Blood Glucose Monitoring Suppl (ACCU-CHEK AVIVA PLUS) w/Device KIT Use to check sugar daily 1 kit 0  . Blood Glucose Monitoring Suppl (ACCU-CHEK NANO SMARTVIEW) w/Device KIT Use to check blood glucose four times a day 1 kit 0  . Cholecalciferol 1000 units tablet Take 1 tablet (1,000 Units total) by mouth daily. 90 tablet 3  . gabapentin (NEURONTIN) 100 MG capsule Take 1 capsule (100 mg total) by mouth at bedtime. 90 capsule 0  . glucose blood (ACCU-CHEK AVIVA PLUS) test strip Use to check sugar once daily in the morning 100 each 12  . hydrocortisone 1 % ointment Apply 1 application topically 2 (two) times daily. 30 g 0  . Lancets (ACCU-CHEK SOFT TOUCH) lancets Use to check sugar daily 100 each 12  . lisinopril (PRINIVIL,ZESTRIL) 5 MG tablet Take 0.5 tablets (2.5 mg total) by mouth daily. 90 tablet 3  . sertraline (ZOLOFT) 25 MG tablet Take 2 tablets (50 mg total) by mouth daily. 180 tablet 0  . Teduglutide, rDNA, (GATTEX) 5 MG KIT Inject 5 mg into the skin daily.    . benzonatate (TESSALON) 200 MG capsule Take 1 capsule (200 mg total) by mouth 2 (two)  times daily as needed for cough. (Patient not taking: Reported on 03/03/2018) 20 capsule 0  . metoprolol succinate (TOPROL-XL) 25 MG 24 hr tablet Take 0.5 tablets (12.5 mg  total) by mouth daily. (Patient not taking: Reported on 03/03/2018) 90 tablet 0   No current facility-administered medications for this visit.     REVIEW OF SYSTEMS:  _0  denotes positive finding, _1  denotes negative finding Cardiac  Comments:  Chest pain or chest pressure:    Shortness of breath upon exertion:    Short of breath when lying flat:    Irregular heart rhythm:        Vascular    Pain in calf, thigh, or hip brought on by ambulation:    Pain in feet at night that wakes you up from your sleep:  x   Blood clot in your veins:    Leg swelling:  x       Pulmonary    Oxygen at home:    Productive cough:     Wheezing:         Neurologic    Sudden weakness in arms or legs:  x   Sudden numbness in arms or legs:  x   Sudden onset of difficulty speaking or slurred speech:    Temporary loss of vision in one eye:     Problems with dizziness:  x       Gastrointestinal    Blood in stool:     Vomited blood:         Genitourinary    Burning when urinating:     Blood in urine:        Psychiatric    Major depression:         Hematologic    Bleeding problems:    Problems with blood clotting too easily:        Skin    Rashes or ulcers:        Constitutional    Fever or chills:      PHYSICAL EXAM: Vitals:   03/03/18 1553 03/03/18 1600  BP: (!) 208/72 (!) 200/71  Pulse: (!) 48 (!) 48  Resp: 16   Temp: (!) 97.3 F (36.3 C)   TempSrc: Oral   SpO2: 99%   Weight: 214 lb (97.1 kg)   Height: _2  (1.702 m)     GENERAL: The patient is a well-nourished female, in no acute distress. The vital signs are documented above. CARDIOVASCULAR: 2+ radial and 2+ dorsalis pedis pulses bilaterally PULMONARY: There is good air exchange  ABDOMEN: Soft and non-tender  MUSCULOSKELETAL: There are no major  deformities or cyanosis. NEUROLOGIC: No focal weakness or paresthesias are detected. SKIN: Multiple varicosities over her right leg over the anterior and lateral tibial area and also her anterior thigh.  Extensive superficial telangiectasia which are raised over the same areas in her distal pretibial area PSYCHIATRIC: The patient has a normal affect.  DATA:  This reveals evidence of prior pain from her below-knee position to the saphenofemoral junction of her great saphenous vein on the right with evidence of small saphenous reflux  MEDICAL ISSUES: I imaged these areas as well.  She does have multiple varicosities that are causing venous hypertension.  Her saphenous vein is been stripped nearly 40 years ago.  I have recommended that she begin using thigh-high compression to hopefully improve her venous hypertension.  She does elevate her legs when possible.  I do feel that she would be a candidate for stab phlebectomy of her multiple tributary varicosities leading to the area of concern where she has had prior venous ulcers and now has swelling and pain.  Also feel that she would benefit  from sclerotherapy of these areas to reduce her risk for recurrent bleeding episodes.  She will initiate her 20-30 thigh-high compression and see Dr. Doren Custard or fields in our office in 3 months for continued discussion   Rosetta Posner, MD [facility] Vascular and Vein Specialists of [facility] Tel [phone] Pager [phone]

## 2018-03-03 NOTE — Progress Notes (Signed)
Vitals:   03/03/18 1553  BP: (!) 208/72  Pulse: (!) 48  Resp: 16  Temp: (!) 97.3 F (36.3 C)  TempSrc: Oral  SpO2: 99%  Weight: 214 lb (97.1 kg)  Height: 5' 7"  (1.702 m)

## 2018-03-04 ENCOUNTER — Other Ambulatory Visit: Payer: Self-pay

## 2018-03-04 ENCOUNTER — Ambulatory Visit (INDEPENDENT_AMBULATORY_CARE_PROVIDER_SITE_OTHER): Payer: Medicare HMO | Admitting: Family Medicine

## 2018-03-04 VITALS — BP 158/64 | HR 52 | Temp 98.0°F | Ht 67.0 in | Wt 216.0 lb

## 2018-03-04 DIAGNOSIS — I10 Essential (primary) hypertension: Secondary | ICD-10-CM | POA: Diagnosis not present

## 2018-03-04 DIAGNOSIS — Z23 Encounter for immunization: Secondary | ICD-10-CM

## 2018-03-04 NOTE — Patient Instructions (Signed)
  Good to see you today!  Please come in tomorrow at 10:30 am to see our nurse. Please bring all of the medications you take with you so she can review them.  If you have questions or concerns please do not hesitate to call at [phone].  Lucila Maine, DO PGY-3, [facility] 03/04/2018 9:45 AM

## 2018-03-04 NOTE — Progress Notes (Signed)
    Subjective:    Patient ID: BRECKLYN GALVIS, female    DOB: 1955-09-14, 62 y.o.   MRN: [id number]   CC: follow up BP  HPI: patient was seen yesterday at [facility] for varicose veins and was noted to have extremely elevated BP. She was told to follow up with PCP. She reports she feels well overall aside from stress- she is her husbands primary caretaker. He has MS and is wheelchair bound. She reports occasional headaches which is unusual for her. She denies chest pain, SOB, blurred vision, focal weakness, or decreased urination. She reports she was recently hospitalized at [facility] for hernia repair but stayed longer due to complications. Her BP was also elevated during this stay. She had low heart rate and intermittent dizziness. She is wearing a holter monitor and was told by heart doctors she will need a pacemaker.   She is unable to tell me what blood pressure medications she's taking. I reviewed her DC summary from [place] but she is unfamiliar with the medication names. She knows she's no longer on metoprolol. She states she thinks she only takes lisinopril. Unsure what dose. She took her BP medications this morning.   Smoking status reviewed- former smoker  Review of Systems- see HPI   Objective:  BP (!) 158/64   Pulse (!) 52   Temp 98 F (36.7 C) (Oral)   Ht 5' 7"  (1.702 m)   Wt 216 lb (98 kg)   SpO2 99%   BMI 33.83 kg/m  Vitals and nursing note reviewed  General: well nourished, in no acute distress HEENT: normocephalic, MMM Cardiac: bradycardic, regular rhythm, clear S1 and S2, no murmurs, rubs, or gallops Respiratory: clear to auscultation bilaterally, no increased work of breathing Extremities: no edema or cyanosis. Varicosities present Neuro: alert and oriented, no focal deficits   Assessment & Plan:    Essential hypertension  BP mildly elevated in office today, much improved from 530Y systolic from visit with [facility]. She is asymptomatic, no signs of end organ damage.  Asked patient to bring in all medications to nurse visit tomorrow for med reconciliation and repeat BP check. Will not make any changes until I am sure of what medications she's taking. From chart review of DC summary she is supposed to be on lisinopril-HCTZ combo but states she's not taking this. If BP elevated would add HCTZ to regimen. Follow up 1 day.      Return in about 1 day (around 03/05/2018).   Lucila Maine, DO Family Medicine Resident PGY-3

## 2018-03-04 NOTE — Assessment & Plan Note (Addendum)
  BP mildly elevated in office today, much improved from 119J systolic from visit with [facility]. She is asymptomatic, no signs of end organ damage. Asked patient to bring in all medications to nurse visit tomorrow for med reconciliation and repeat BP check. Will not make any changes until I am sure of what medications she's taking. From chart review of DC summary she is supposed to be on lisinopril-HCTZ combo but states she's not taking this. If BP elevated would add HCTZ to regimen. Follow up 1 day.

## 2018-03-05 ENCOUNTER — Ambulatory Visit (INDEPENDENT_AMBULATORY_CARE_PROVIDER_SITE_OTHER): Payer: Medicare HMO | Admitting: Pharmacist

## 2018-03-05 DIAGNOSIS — E1140 Type 2 diabetes mellitus with diabetic neuropathy, unspecified: Secondary | ICD-10-CM | POA: Diagnosis not present

## 2018-03-05 DIAGNOSIS — I10 Essential (primary) hypertension: Secondary | ICD-10-CM

## 2018-03-05 MED ORDER — LISINOPRIL-HYDROCHLOROTHIAZIDE 20-25 MG PO TABS: 1.0000 | ORAL_TABLET | Freq: Every day | ORAL | 1 refills | Status: DC

## 2018-03-05 MED ORDER — GABAPENTIN 300 MG PO CAPS: 300.0000 mg | ORAL_CAPSULE | Freq: Three times a day (TID) | ORAL | 1 refills | Status: DC

## 2018-03-05 NOTE — Patient Instructions (Addendum)
It was great to see you today!  Your blood pressure was above goal of less than 130/61mHg.   We will increase your lisinopril-HCTZ from 193m12.5mg to lisinopril-HCTZ  204m5mg. You can take two tablets of lisinopril-HCTZ 69m48m.5mg until you pick up your new prescription from [facility].   We sent a new prescription for gabapentin 300mg60mee times daily to [facility]. At first use gabapentin 100mg 6mmorning and afternoon and 300mg a26mght. Then increase to gabapentin 300mg th4mtimes a day.   Take your amlodipine 69mg at 21mt.

## 2018-03-05 NOTE — Progress Notes (Signed)
S:    [name] is a 62 y.o. female who presents today in good spirits, ambulating without support. She was seen yesterday (03/04/18) by Dr. Vanetta [name] and had confusion as to what she was taking and questions about her medication. She was previously referred to clinic for diabetes management, however she is status post bypass surgery and lost a significant amount of weight.        Patient reports adherence with medications for the past two weeks.   [name] has all current medication in hand. She endorses some dizziness and lightheadedness that she attributes to her heart rate dropping. She has been taking metoprolol succinate 77m, 1/2 tablet daily for the past two weeks. She reports stopping it for a while but restarting. She reports taking lisinopril/HCTZ 10/12.518mdaily though she also has lisinopril 58m74mn her medication bag.    She cannot afford bariatric vitamins so based on the recommendation from a nutritionist takes 4 tablets of Centrum gummy multivitamin daily. The nutritionist did recommend starting bariatric vitamins as soon as patient can afford. [name] taking sertraline 258m37mwo tablets daily but has not noticed a change in mood. She reports tingling and burning in her feet that has been controlled on gabapentin 300mg59m in the past, however she is taking gabapentin 100mg 31mnow and reports worsening pain.   All other medications she brings with her are appropriate and she is taking as prescribed.   Current BP Medications include:  lisinopril-HCTZ 10mg-148mg and metoprolol 258mg, t83m1/2 tablet daily  O:  Physical Exam  Constitutional: She appears well-developed and well-nourished.  Vitals reviewed.  Objective:  BP (!) 150/74 (BP Location: Left Arm, Patient Position: Sitting, Cuff Size: Large)   Pulse (!) 52   Ht 5' 8"  (1.727 m)   Wt 216 lb 12.8 oz (98.3 kg)   SpO2 97%   BMI 32.96 kg/m   Appearance alert, well appearing, and in no distress.  Review of Systems   Neurological: Positive for tingling and sensory change.  All other systems reviewed and are negative.   Last 3 Office BP readings: BP Readings from Last 3 Encounters:  03/05/18 (!) 150/74  03/04/18 (!) 158/64  03/03/18 (!) 200/71    BMET    Component Value Date/Time   NA 145 (H) 12/03/2017 1204   NA 138 11/10/2013 0526   K 4.4 12/03/2017 1204   K 4.0 11/10/2013 0526   CL 108 (H) 12/03/2017 1204   CL 106 11/10/2013 0526   CO2 24 12/03/2017 1204   CO2 28 11/10/2013 0526   GLUCOSE 146 (H) 12/03/2017 1204   GLUCOSE 167 (H) 03/15/2016 1807   GLUCOSE 152 (H) 11/10/2013 0526   GLUCOSE 358 (H) 04/09/2006 0904   BUN 12 12/03/2017 1204   BUN 14 11/10/2013 0526   CREATININE 1.01 (H) 12/03/2017 1204   CREATININE 0.70 03/08/2016 1530   CALCIUM 10.8 (H) 12/03/2017 1204   CALCIUM 9.6 05/25/2015 1551   GFRNONAA 59 (L) 12/03/2017 1204   GFRNONAA 46 (L) 11/10/2013 0526   GFRAA 68 12/03/2017 1204   GFRAA 53 (L) 11/10/2013 0526    Renal function: CrCl cannot be calculated (Patient's most recent lab result is older than the maximum 21 days allowed.).  A/P: Hypertension longstanding currently on lisinopril-HCTZ 10mg-12.30mdaily and metoprolol 12.58mg daily76mBP Goal < 130/80 mmHg. Patient is adherent with current medications. Bradycardia noted and relatively asymptomatic.  -Discontinued metoprolol 12.58mg daily 758m removed from her possession.  Lisinopril  5 mg was also removed from her possession. This was discontinued in the past but due to confusion patient restarted. Patient agreeable to disposal in clinic today to prevent further confusion.  -Increased  lisinopril-HCTZ from 55m-12.5mg to 285m25mg daily.  -F/u BMET in two weeks  Nerve Pain/ Diabetic Neuropathy: Patient endorses tingling and burning in her feet. She previously was on gabapentin 30069mID, however that was stopped for unclear reasons. She was reinitiated on gabapentin 100m84mily and has self titrated to 100mg80m.  Discussed patient with Dr. WaldeMingo Amberal function is at baseline.  -Increase gabapentin to 300mg 31me times daily.  -Counseled patient to use gabapentin 100mg f22morning and afternoon and 300mg at69mht. Then increase to gabapentin 300mg thr80mimes a day as tolerated.    Results reviewed and written information provided.   Total time in face-to-face counseling 35 minutes.   F/U Clinic Visit in 2 weeks with Dr. Riccio.  [name] Shawlt seen with [name] LoveIsaias [name], [name].

## 2018-03-05 NOTE — Assessment & Plan Note (Signed)
Nerve Pain/ Diabetic Neuropathy: Patient endorses tingling and burning in her feet. She previously was on gabapentin 346m TID, however that was stopped for unclear reasons. She was reinitiated on gabapentin 1073mdaily and has self titrated to 10056mID. Discussed patient with Dr. WalMingo Amberenal function is at baseline.  -Increase gabapentin to 300m80mree times daily.  -Counseled patient to use gabapentin 100mg28m morning and afternoon and 300mg 41might. Then increase to gabapentin 300mg t31m times a day as tolerated.

## 2018-03-05 NOTE — Assessment & Plan Note (Signed)
Hypertension longstanding currently on lisinopril-HCTZ 28m-12.23mg daily and metoprolol 12.561mdaily.  BP Goal < 130/80 mmHg. Patient is adherent with current medications. Bradycardia noted and relatively asymptomatic.  -Discontinued metoprolol 12.23m523maily and removed from her possession.  Lisinopril 5 mg was also removed from her possession. This was discontinued in the past but due to confusion patient restarted. Patient agreeable to disposal in clinic today to prevent further confusion.  -Increased  lisinopril-HCTZ from 52m14m.23mg to 20mg54mg daily.  -F/u BMET in two weeks

## 2018-03-16 ENCOUNTER — Ambulatory Visit: Payer: Medicare HMO | Admitting: Family Medicine

## 2018-03-16 DIAGNOSIS — I10 Essential (primary) hypertension: Secondary | ICD-10-CM | POA: Diagnosis not present

## 2018-03-16 DIAGNOSIS — G4733 Obstructive sleep apnea (adult) (pediatric): Secondary | ICD-10-CM | POA: Diagnosis not present

## 2018-03-16 DIAGNOSIS — G4710 Hypersomnia, unspecified: Secondary | ICD-10-CM | POA: Diagnosis not present

## 2018-03-20 ENCOUNTER — Telehealth: Payer: Self-pay | Admitting: Family Medicine

## 2018-03-20 DIAGNOSIS — I2510 Atherosclerotic heart disease of native coronary artery without angina pectoris: Secondary | ICD-10-CM

## 2018-03-20 NOTE — Telephone Encounter (Signed)
Pt was admitted at [facility] in [place] for heart issues. She was seen by a cardiologist there and was wanting to follow up with them. They told her she would need a referral from her PCP to continue with them. She does not know the name of the office but has the phone number which is [phone]. I explained she would most likely need to be seen her by Dr. Vanetta Shawl first but she asked to see if this was possible asap. She is going to schedule office visit with Dr. Vanetta Shawl for October. The best contact number for [name] is [phone].

## 2018-03-20 NOTE — Telephone Encounter (Signed)
Will forward to MD. Monti Villers,CMA  

## 2018-03-23 NOTE — Telephone Encounter (Signed)
Referral made to [facility] per patient request.  Lucila Maine, DO PGY-3, [facility] 03/23/2018 1:47 PM

## 2018-04-02 ENCOUNTER — Other Ambulatory Visit: Payer: Self-pay | Admitting: Family Medicine

## 2018-04-03 ENCOUNTER — Ambulatory Visit: Payer: Medicare HMO | Admitting: Family Medicine

## 2018-04-03 NOTE — Telephone Encounter (Signed)
Patient due for follow up appointment to recheck BP, can see me or in pharmacy clinic

## 2018-04-07 ENCOUNTER — Telehealth: Payer: Self-pay | Admitting: Psychology

## 2018-04-07 NOTE — Telephone Encounter (Signed)
Patient called to schedule. Has seen [name] before.  Thinks she might have met me but I don't see a record of it. Scheduled for Thursday at 10:30.

## 2018-04-08 ENCOUNTER — Ambulatory Visit: Payer: Medicare HMO | Admitting: Family Medicine

## 2018-04-08 DIAGNOSIS — I4891 Unspecified atrial fibrillation: Secondary | ICD-10-CM | POA: Diagnosis not present

## 2018-04-09 ENCOUNTER — Ambulatory Visit (INDEPENDENT_AMBULATORY_CARE_PROVIDER_SITE_OTHER): Payer: Medicare HMO | Admitting: Psychology

## 2018-04-09 DIAGNOSIS — F32A Depression, unspecified: Secondary | ICD-10-CM

## 2018-04-09 DIAGNOSIS — F329 Major depressive disorder, single episode, unspecified: Secondary | ICD-10-CM | POA: Diagnosis not present

## 2018-04-09 NOTE — Assessment & Plan Note (Signed)
Patient is appropriately dressed.  She maintains good eye contact and is cooperative and attentive.  Speech is normal in tone and rhythm and is mildly pressured. Mood is not reported. She displays an appropriate affect.  Thought process is circumstantial. She needs to be redirected throughout the visit. No evidence of suicidal or homicidal ideation.  Does not appear to be responding to any internal stimuli. Judgment and insight are average.  Patient reports her the thing that helps her the most through counseling is someone listening to her. She seems to be in a reporting mode with little to no time spent on evaluating or problem solving the things that are bothering her. She seems to lead a busy life with several things she enjoys including babysitting. She states she tends to put other's needs before her own. Her daughter and a friend that lives with her and her husband both help her care for her husband.  Had trouble focusing the patient on something actionable today but eventually she named exercise as her top-priority. She has a positive experience with exercise and yet hasn't been for 2 months. Used MI to assess barriers and drill down on a specific goal. She is 8 for importance and 10 for confidence.  See patient instructions for further plan.

## 2018-04-09 NOTE — Patient Instructions (Addendum)
I plan to see you back on  November 14th at 8:30.  I will call in two weeks to check on how you are doing.  You said that going to the gym is good for your mental and physical health. I couldn't agree more.   You said you could go to the gym Wednesday, Saturday, and Sunday between 4-7 pm.  You said you could add another day if available. You expect healthier eating, feeling better mentally and physically, and it makes you feel good because you are doing something for yourself and you know that is important. You thought spending an hour to an hour and a half was doable.

## 2018-04-09 NOTE — Progress Notes (Signed)
Integrated Behavioral Health Warm Handoff  MRN: [id number] Name: [name]  Session Start time: 10:40  Session End time: 11:20 Total time: 40 minutes  Type of Service: Integrated Behavioral Health Interpretor:No.   SUBJECTIVE: [name] is a 62 y.o. female scheduled in [facility] of her own accord. She was previously seen by [name] (former [employer].  Last seen by [facility] 10/03/17  Report of symptoms: Feeling sad and overwhelmed.   ASSESSMENT: Mood: Euthymic and Affect: Appropriate Patient is currently experiencing symptoms of stress which are exacerbated by care taking her husband with MS. .  Patient may benefit from and is in agreement to receive further assessment and brief therapeutic interventions to assist with managing her stress.Marland Kitchen   PLAN / GOALS: Patient will: 1. Reduce symptoms of: anxiety 2. Increase knowledge and/or ability of: stress reduction  3. Demonstrate ability to: Increase healthy adjustment to current life circumstances 4.   Follow up with behavioral health clinician: In one month. Patient wants an 8:30 appointment. The next available was one month. I will call her in two weeks to check in on her goal. _______________________________________________________  Duration of CURRENT symptoms: Chronic  INTERVENTION:  Motivational Interviewing and Behavioral Activation,  Supportive Counseling   PHQ 9=7,indication of : mild symptoms of depression.  GAD-7=7,indication of : mild symptoms of anxiety.

## 2018-04-10 ENCOUNTER — Ambulatory Visit (INDEPENDENT_AMBULATORY_CARE_PROVIDER_SITE_OTHER): Payer: Medicare Other | Admitting: Orthopedic Surgery

## 2018-04-10 ENCOUNTER — Encounter (INDEPENDENT_AMBULATORY_CARE_PROVIDER_SITE_OTHER): Payer: Self-pay | Admitting: Orthopedic Surgery

## 2018-04-10 DIAGNOSIS — M1712 Unilateral primary osteoarthritis, left knee: Secondary | ICD-10-CM | POA: Diagnosis not present

## 2018-04-10 NOTE — Progress Notes (Signed)
Office Visit Note   Patient: [name]           Date of Birth: 1955-12-28           MRN: [id number] Visit Date: 04/10/2018 Requested by: Steve Rattler, DO [place], [place] [address] PCP: Steve Rattler, DO  Subjective: Chief Complaint  Patient presents with  . Left Knee - Pain  . Right Knee - Pain    HPI: [name] is a patient with bilateral knee pain left worse than right.  Dates the pain is getting worse.  She reports weakness and giving way along with catching and locking.  She has lost 140 pounds since gastric bypass surgery 2015.  She has vein surgery outpatient pending on the right leg in early December.  She did have some issues with her heart but after monitor for a month there was no need for pacemaker.  She does have atrial fibrillation and takes Eliquis daily.  She has a stress test which was done yesterday and the results are not available.  She is on disability.  She has no history of left knee surgery.  She has had right knee arthroscopy in the past.  She would like to have left knee replaced before the end of the year.  She would also like to have the alignment improved because currently her left leg is in valgus alignment.              ROS: All systems reviewed are negative as they relate to the chief complaint within the history of present illness.  Patient denies  fevers or chills.   Assessment & Plan: Visit Diagnoses:  1. Unilateral primary osteoarthritis, left knee     Plan: Impression is left knee valgus arthritis with mild deformity.  Plan is the risk and benefits of total knee replacement are discussed including but limited to infection nerve vessel damage knee stiffness and potential need for revision.  Patient understands risk and benefits.  First priority his to see with the results of the stress test is.  If that stress test dictates that surgery can proceed then we will need to obtain cardiac risk stratification as well as advice on  Eliquis and whether or not that needs to be stopped or whether or not that needs to be transitioned to a Lovenox bridge.  She will call me with the results of her stress test and we can likely get this thing done before the end of the year.  Follow-Up Instructions: Return if symptoms worsen or fail to improve.   Orders:  No orders of the defined types were placed in this encounter.  No orders of the defined types were placed in this encounter.     Procedures: No procedures performed   Clinical Data: No additional findings.  Objective: Vital Signs: There were no vitals taken for this visit.  Physical Exam:   Constitutional: Patient appears well-developed HEENT:  Head: Normocephalic Eyes:EOM are normal Neck: Normal range of motion Cardiovascular: Normal rate Pulmonary/chest: Effort normal Neurologic: Patient is alert Skin: Skin is warm Psychiatric: Patient has normal mood and affect    Ortho Exam: Ortho exam demonstrates full active and passive range of motion of the right knee.  On the left knee there is valgus alignment with significant crepitus with range of motion.  She does have full extension and about 100 degrees of flexion.  Collaterals are stable.  Extensor mechanism is intact.  Skin is intact.  No  groin pain with internal/external rotation of the leg.  Pedal pulses palpable.  Specialty Comments:  No specialty comments available.  Imaging: No results found.   PMFS History: Patient Active Problem List   Diagnosis Date Noted  . Venous stasis dermatitis of right lower extremity 01/02/2018  . Genetic testing 11/24/2017  . Family history of colon cancer   . B12 deficiency 12/13/2016  . Malabsorption 03/12/2016  . Tubular adenoma of colon 03/09/2016  . Vitamin D deficiency 03/09/2016  . Rectovaginal fistula 05/25/2014  . S/P bariatric surgery-duodenal switch with sleeve gastrectomy 12/04/2013  . Nonobstructive CAD  08/26/2013  . Iron deficiency anemia  06/19/2012  . Low back pain 03/05/2012  . Osteoarthritis of knee 04/23/2011  . UNSPECIFIED VENOUS INSUFFICIENCY 05/28/2010  . PSORIASIS 05/28/2010  . HYPERCHOLESTEROLEMIA 03/20/2010  . Essential hypertension 03/20/2010  . Chronic dermatitis of hands 05/02/2009  . Peripheral neuropathy 01/12/2009  . Type 2 diabetes, controlled, with neuropathy ([facility]) 02/18/2008  . Chronic depression 10/06/2007  . OBSTRUCTIVE SLEEP APNEA 06/12/2007  . GOITER, MULTINODULAR 05/13/2007   Past Medical History:  Diagnosis Date  . Allergy   . ANEMIA, PERNICIOUS, HX OF 05/13/2007  . Anxiety   . Arthritis   . ASYMPTOMATIC POSTMENOPAUSAL STATUS 02/18/2008  . BACK PAIN, LUMBAR 11/19/2007  . Bowel incontinence   . Cataract   . Cellulitis of left lower leg 10/28/2013  . Cellulitis of leg, right 10/11/2010  . DEPRESSION, CHRONIC 10/06/2007  . Diabetes mellitus without complication ([facility])   . DIABETES MELLITUS, WITH NEUROLOGICAL COMPLICATIONS 4/91/7915  . DYSLIPIDEMIA 05/13/2007  . Family history of colon cancer   . Fecal soiling 06/28/2014  . Frozen shoulder    Lt  . GOITER, MULTINODULAR 05/13/2007  . Hx of colonic polyps 12/04/2010  . HYPERCHOLESTEROLEMIA 03/20/2010  . HYPERTENSION 03/20/2010  . Lower back pain   . NASH (nonalcoholic steatohepatitis)   . OBSTRUCTIVE SLEEP APNEA 06/12/2007   uses CPAP...has not used for 2.5 years  . PERIPHERAL NEUROPATHY 01/12/2009  . PSORIASIS 05/28/2010  . Renal insufficiency    stage 1 kd  . Sleep apnea   . UNSPECIFIED VENOUS INSUFFICIENCY 05/28/2010    Family History  Problem Relation Age of Onset  . Hyperlipidemia Father   . Hypertension Father   . Cirrhosis Father   . Colon cancer Father 36       d. 69  . Colon cancer Sister 21       d. 37  . Bipolar disorder Sister   . Aneurysm Mother        brain  . Cerebral aneurysm Mother   . Colon cancer Sister 6       d. 48  . Bipolar disorder Sister   . Cancer Paternal Aunt        NOS, ? colon  . Congestive Heart  Failure Maternal Grandmother   . Drug abuse Neg Hx   . CAD Neg Hx   . Stomach cancer Neg Hx     Past Surgical History:  Procedure Laterality Date  . COLONOSCOPY    . ELECTROCARDIOGRAM  04/16/2006  . EXAMINATION UNDER ANESTHESIA N/A 06/29/2014   Procedure: EXAM UNDER ANESTHESIA;  Surgeon: Janyth Contes, MD;  Location: [facility];  Service: Gynecology;  Laterality: N/A;  . Exercise myoview  01/24/2005  . FLEXIBLE SIGMOIDOSCOPY    . GASTRIC BYPASS  11/09/2013  . GASTRIC BYPASS    . KNEE ARTHROSCOPY  2003   right  . LAPAROSCOPY N/A 03/12/2016   Procedure: LAPAROSCOPIC ANASTOMOSIS OF  INTESTINE (ENTEROENTEROSTOMY);  Surgeon: Ladora Daniel, MD;  Location: [facility];  Service: General;  Laterality: N/A;  . LEG SURGERY     metal and pins in lower left leg  . mrsa Right    arm  . ROTATOR CUFF REPAIR  2008   right  . SHOULDER ARTHROSCOPY W/ ROTATOR CUFF REPAIR Right   . TONSILLECTOMY    . VARICOSE VEIN SURGERY     Remotef   Social History   Occupational History  . Occupation: Unemployed    Comment: disabled  Tobacco Use  . Smoking status: Former Smoker    Packs/day: 2.00    Years: 24.00    Pack years: 48.00    Types: Cigarettes    Start date: 06/24/1970    Last attempt to quit: 08/06/1994    Years since quitting: 23.6  . Smokeless tobacco: Never Used  Substance and Sexual Activity  . Alcohol use: No    Alcohol/week: 0.0 standard drinks  . Drug use: No  . Sexual activity: Never

## 2018-04-15 DIAGNOSIS — G4733 Obstructive sleep apnea (adult) (pediatric): Secondary | ICD-10-CM | POA: Diagnosis not present

## 2018-04-15 DIAGNOSIS — I10 Essential (primary) hypertension: Secondary | ICD-10-CM | POA: Diagnosis not present

## 2018-04-15 DIAGNOSIS — G4710 Hypersomnia, unspecified: Secondary | ICD-10-CM | POA: Diagnosis not present

## 2018-04-28 ENCOUNTER — Telehealth: Payer: Self-pay | Admitting: Family Medicine

## 2018-04-28 NOTE — Telephone Encounter (Signed)
Attempted to contact pt to remind them of their appt tomorrow on 04/29/2018. Left voicemail. -[place]

## 2018-04-29 ENCOUNTER — Encounter

## 2018-04-29 ENCOUNTER — Encounter: Payer: Self-pay | Admitting: Family Medicine

## 2018-04-29 ENCOUNTER — Ambulatory Visit (INDEPENDENT_AMBULATORY_CARE_PROVIDER_SITE_OTHER): Payer: Medicare HMO | Admitting: Family Medicine

## 2018-04-29 ENCOUNTER — Other Ambulatory Visit: Payer: Self-pay

## 2018-04-29 VITALS — BP 136/60 | HR 51 | Temp 98.3°F | Ht 68.0 in | Wt 222.8 lb

## 2018-04-29 DIAGNOSIS — I10 Essential (primary) hypertension: Secondary | ICD-10-CM | POA: Diagnosis not present

## 2018-04-29 DIAGNOSIS — D509 Iron deficiency anemia, unspecified: Secondary | ICD-10-CM | POA: Diagnosis not present

## 2018-04-29 DIAGNOSIS — E538 Deficiency of other specified B group vitamins: Secondary | ICD-10-CM | POA: Diagnosis not present

## 2018-04-29 DIAGNOSIS — E1140 Type 2 diabetes mellitus with diabetic neuropathy, unspecified: Secondary | ICD-10-CM

## 2018-04-29 DIAGNOSIS — F329 Major depressive disorder, single episode, unspecified: Secondary | ICD-10-CM | POA: Diagnosis not present

## 2018-04-29 DIAGNOSIS — R413 Other amnesia: Secondary | ICD-10-CM

## 2018-04-29 DIAGNOSIS — E559 Vitamin D deficiency, unspecified: Secondary | ICD-10-CM

## 2018-04-29 DIAGNOSIS — F32A Depression, unspecified: Secondary | ICD-10-CM

## 2018-04-29 LAB — POCT GLYCOSYLATED HEMOGLOBIN (HGB A1C): HbA1c, POC (controlled diabetic range): 7 % (ref 0.0–7.0)

## 2018-04-29 MED ORDER — SERTRALINE HCL 50 MG PO TABS: 75.0000 mg | ORAL_TABLET | Freq: Every day | ORAL | 0 refills | Status: DC

## 2018-04-29 NOTE — Progress Notes (Signed)
    Subjective:    Patient ID: [name], female    DOB: [birth year], 62 y.o.   MRN: [id number]   CC: "meet new doctor"  HPI: Patient coming in today to meet me and go over her medication list. She also wants to discuss her memory and mood. She would like her kidney levels checked today.  Memory not going well- forgetting things a lot. This has been going on for a while. She states she has had 2 memory tests and everything is normal. She will go to a room and forget what she was going to get. She can recall long term memories but struggles with short term. It is distressing to her.   Med rec- wants to review medications and make sure she is taking things properly.  Mood- feels the zoloft isn't working as well as it used to. She is working with [facility] and has appt next week to continue talk therapy. She denies SI/HI.   Smoking status reviewed- former smoker  Review of Systems- see HPI   Objective:  BP 136/60   Pulse (!) 51   Temp 98.3 F (36.8 C) (Oral)   Ht 5' 8"  (1.727 m)   Wt 222 lb 12.8 oz (101.1 kg)   SpO2 96%   BMI 33.88 kg/m  Vitals and nursing note reviewed  General: well nourished, in no acute distress HEENT: normocephalic, MMM Cardiac: regular rate Respiratory: no increased work of breathing Extremities: no edema or cyanosis.  Neuro: alert and oriented, no focal deficits   Assessment & Plan:    Chronic depression  Following w/ [facility], next sees them 11/14. Last PHQ9 score was 7 in October. Will increase zoloft to 75 mg daily then can go up as needed. New rx sent to pharmacy.  Memory loss  This is not a new issue for patient but is bothering her significantly. May be due to uncontrolled depression. Will check RPR, TSH, CBC, B12 to r/o organic cause. No focal deficits. Will follow up 1 month.   Type 2 diabetes, controlled, with neuropathy (HCC)  A1C stable today, continue current regimen.   Vitamin D deficiency  Will recheck vitamin D level today. Patient  reports she has not been taking vitamin D that is on her med list.     Return in about 4 weeks (around 05/27/2018).   Lucila Maine, DO Family Medicine Resident PGY-3

## 2018-04-29 NOTE — Patient Instructions (Signed)
  Nice to see you today.  Your A1C was 7.0 which I am happy with.  We will check labs today to look at kidneys and see if we can find a cause for your memory issues.  We will increase your zoloft to 75 mg daily (1.5 pills a day) and I sent this prescription to your pharmacy.  Please follow up next week 11/14 with Dr. Gwenlyn Saran as scheduled at 8:30 am.  I'd like to see you back in 1 month to check in.  If you have questions or concerns please do not hesitate to call at [phone].  Lucila Maine, DO PGY-3, [facility] 04/29/2018 11:44 AM

## 2018-04-30 ENCOUNTER — Encounter: Payer: Self-pay | Admitting: Family Medicine

## 2018-04-30 DIAGNOSIS — F03A0 Unspecified dementia, mild, without behavioral disturbance, psychotic disturbance, mood disturbance, and anxiety: Secondary | ICD-10-CM | POA: Insufficient documentation

## 2018-04-30 DIAGNOSIS — R4181 Age-related cognitive decline: Secondary | ICD-10-CM | POA: Insufficient documentation

## 2018-04-30 DIAGNOSIS — G3184 Mild cognitive impairment, so stated: Secondary | ICD-10-CM | POA: Insufficient documentation

## 2018-04-30 DIAGNOSIS — R4189 Other symptoms and signs involving cognitive functions and awareness: Secondary | ICD-10-CM | POA: Insufficient documentation

## 2018-04-30 LAB — BASIC METABOLIC PANEL
BUN/Creatinine Ratio: 19 (ref 12–28)
BUN: 20 mg/dL (ref 8–27)
CHLORIDE: 103 mmol/L (ref 96–106)
CO2: 25 mmol/L (ref 20–29)
CREATININE: 1.06 mg/dL — AB (ref 0.57–1.00)
Calcium: 10.1 mg/dL (ref 8.7–10.3)
GFR, EST AFRICAN AMERICAN: 65 mL/min/{1.73_m2} (ref >59)
GFR, EST NON AFRICAN AMERICAN: 56 mL/min/{1.73_m2} — AB (ref >59)
Glucose: 89 mg/dL (ref 65–99)
Potassium: 4.6 mmol/L (ref 3.5–5.2)
Sodium: 145 mmol/L — ABNORMAL HIGH (ref 134–144)

## 2018-04-30 LAB — VITAMIN D 25 HYDROXY (VIT D DEFICIENCY, FRACTURES): VIT D 25 HYDROXY: 20.4 ng/mL — AB (ref 30.0–100.0)

## 2018-04-30 LAB — CBC
HEMOGLOBIN: 12 g/dL (ref 11.1–15.9)
Hematocrit: 36.8 % (ref 34.0–46.6)
MCH: 26.3 pg — ABNORMAL LOW (ref 26.6–33.0)
MCHC: 32.6 g/dL (ref 31.5–35.7)
MCV: 81 fL (ref 79–97)
Platelets: 245 10*3/uL (ref 150–450)
RBC: 4.56 x10E6/uL (ref 3.77–5.28)
RDW: 14.2 % (ref 12.3–15.4)
WBC: 4.7 10*3/uL (ref 3.4–10.8)

## 2018-04-30 LAB — RPR: RPR Ser Ql: NONREACTIVE

## 2018-04-30 LAB — VITAMIN B12: Vitamin B-12: 364 pg/mL (ref 232–1245)

## 2018-04-30 LAB — TSH: TSH: 1.71 u[IU]/mL (ref 0.450–4.500)

## 2018-04-30 NOTE — Assessment & Plan Note (Signed)
  A1C stable today, continue current regimen.

## 2018-04-30 NOTE — Progress Notes (Signed)
  Letter sent to patient w/ results.  Lucila Maine, DO PGY-3, [facility] 04/30/2018 12:40 PM

## 2018-04-30 NOTE — Assessment & Plan Note (Signed)
  This is not a new issue for patient but is bothering her significantly. May be due to uncontrolled depression. Will check RPR, TSH, CBC, B12 to r/o organic cause. No focal deficits. Will follow up 1 month.

## 2018-04-30 NOTE — Assessment & Plan Note (Signed)
  Will recheck vitamin D level today. Patient reports she has not been taking vitamin D that is on her med list.

## 2018-04-30 NOTE — Assessment & Plan Note (Signed)
  Following w/ [facility], next sees them 11/14. Last PHQ9 score was 7 in October. Will increase zoloft to 75 mg daily then can go up as needed. New rx sent to pharmacy.

## 2018-05-05 ENCOUNTER — Telehealth (INDEPENDENT_AMBULATORY_CARE_PROVIDER_SITE_OTHER): Payer: Self-pay | Admitting: Orthopedic Surgery

## 2018-05-05 NOTE — Telephone Encounter (Signed)
Patient calling wanting to know the status of surgery for L-TKA. Surgery sheet needed.  She states the stress test was done 3-4 weeks ago (possibly 04-09-18).  Patient has our fax number and is calling now to request the results be faxed to Dr Marlou Sa today.

## 2018-05-05 NOTE — Telephone Encounter (Signed)
Please review and advise. Provide blue sheet if needed.  Last OV in October patient was scheduled for stress test.

## 2018-05-06 ENCOUNTER — Other Ambulatory Visit: Payer: Self-pay | Admitting: Family Medicine

## 2018-05-06 NOTE — Telephone Encounter (Signed)
She is also asking about cardiology test results. I laid them on your computer please advise thanks.

## 2018-05-06 NOTE — Telephone Encounter (Signed)
Blue sheet done pls cla lthx

## 2018-05-07 ENCOUNTER — Ambulatory Visit (INDEPENDENT_AMBULATORY_CARE_PROVIDER_SITE_OTHER): Payer: Medicare HMO | Admitting: Psychology

## 2018-05-07 DIAGNOSIS — F329 Major depressive disorder, single episode, unspecified: Secondary | ICD-10-CM

## 2018-05-07 DIAGNOSIS — F32A Depression, unspecified: Secondary | ICD-10-CM

## 2018-05-07 NOTE — Patient Instructions (Signed)
After your appointment on Monday - hopefully back to the gym. To remind you - Wed, Sat, Sun (at least three days a week).  You thought between 4-7 was good timing and for 60-90 minutes. I am excited to hear how you do.  Your sleep goal is to get in bed around 1:00 a.m. And to use your C-pap. I think this is important for several aspects of your health.  With regards to your memory, anxiety, stress can get in the way of storing information. This may be a piece of it. I am glad you are working with Dr. Vanetta Shawl since you are worried about this.  I will speak to [name] and Darrian to see next steps.

## 2018-05-07 NOTE — Assessment & Plan Note (Signed)
Affect was bright. She was tearful around concerns with memory. Her awareness of her tendency to talk a lot and provide unnecessary details was interesting. She likes to talk and needs room to do that but can be redirected and I think appreciates it.  She has two goals that she seems motivated to accomplish. See patient instructions for further detailed plan.

## 2018-05-07 NOTE — Progress Notes (Signed)
Integrated Behavioral Health Follow Up Visit  MRN: [id number] Name: [name]  Number of [facility] Clinician visits: 2/6 Session Start time: 8:40  Session End time: 9:10 Total time: 30 minutes  Reason for follow-up: Continue brief intervention to assist patient with managing symptoms of stress and also wanting to make positive changes for her health.   Report of symptoms: Continues with feeling overwhelmed and stressed. She has some health issues that are worrying her (cards appointment in [place] on Monday and she is concerned about memory issues. Does not appear to be affecting daily function significantly at the present time.   ASSESSMENT: Mood: Euthymic and Affect: Appropriate;Thought process: Coherent; She expressed awareness of her tendency to talk a lot and to provide unnecessary details. She said she appreciates being redirected.  No evidence of SI. Patient continues to contemplate goals. She was unable to follow up with her goal for the gym secondary to not being cleared from a cardiac standpoint. She hopes to get that clearance Monday. She set another goal - to get more sleep and regularly use her CPAP.    GOALS:Patient will: 1. Reduce symptoms of: anxiety 2. Increase knowledge and/or ability of: healthy habits   PLAN:  1.Patient will F/U with [facility] on 12/4 with Darrian or Deborah weeks 2.Behavioral recommendations: See patient instructions  I told the patient that I was leaving the practice on 11/29. She voiced an understanding.   Intervention: Motivational Interviewing, Reflective listening, Behavioral Therapy (Relaxed breathing); Psychoeducation

## 2018-05-07 NOTE — Telephone Encounter (Signed)
Cant find them

## 2018-05-07 NOTE — Telephone Encounter (Signed)
You already gave them back to me yesterday and said you needed a better plan for plavix/lovenox

## 2018-05-11 DIAGNOSIS — I480 Paroxysmal atrial fibrillation: Secondary | ICD-10-CM | POA: Insufficient documentation

## 2018-05-11 DIAGNOSIS — E785 Hyperlipidemia, unspecified: Secondary | ICD-10-CM | POA: Diagnosis not present

## 2018-05-11 DIAGNOSIS — I2510 Atherosclerotic heart disease of native coronary artery without angina pectoris: Secondary | ICD-10-CM | POA: Diagnosis not present

## 2018-05-11 DIAGNOSIS — I10 Essential (primary) hypertension: Secondary | ICD-10-CM | POA: Diagnosis not present

## 2018-05-11 DIAGNOSIS — Z01810 Encounter for preprocedural cardiovascular examination: Secondary | ICD-10-CM | POA: Diagnosis not present

## 2018-05-11 DIAGNOSIS — R001 Bradycardia, unspecified: Secondary | ICD-10-CM | POA: Diagnosis not present

## 2018-05-11 HISTORY — DX: Paroxysmal atrial fibrillation: I48.0

## 2018-05-16 DIAGNOSIS — I10 Essential (primary) hypertension: Secondary | ICD-10-CM | POA: Diagnosis not present

## 2018-05-16 DIAGNOSIS — G4733 Obstructive sleep apnea (adult) (pediatric): Secondary | ICD-10-CM | POA: Diagnosis not present

## 2018-05-16 DIAGNOSIS — G4710 Hypersomnia, unspecified: Secondary | ICD-10-CM | POA: Diagnosis not present

## 2018-05-25 ENCOUNTER — Other Ambulatory Visit: Payer: Self-pay | Admitting: Family Medicine

## 2018-05-25 MED ORDER — LISINOPRIL-HYDROCHLOROTHIAZIDE 20-25 MG PO TABS: 1.0000 | ORAL_TABLET | Freq: Every day | ORAL | 3 refills | Status: DC

## 2018-05-25 NOTE — Telephone Encounter (Signed)
rx failed.  Called to pharmacy verbally . [place], [name], CMA

## 2018-05-25 NOTE — Addendum Note (Signed)
Addended by: Christen Bame D on: 05/25/2018 02:42 PM   Modules accepted: Orders

## 2018-05-27 ENCOUNTER — Ambulatory Visit (INDEPENDENT_AMBULATORY_CARE_PROVIDER_SITE_OTHER): Payer: Medicare HMO | Admitting: Licensed Clinical Social Worker

## 2018-05-27 DIAGNOSIS — F32A Depression, unspecified: Secondary | ICD-10-CM

## 2018-05-27 DIAGNOSIS — F329 Major depressive disorder, single episode, unspecified: Secondary | ICD-10-CM

## 2018-05-27 NOTE — BH Specialist Note (Signed)
Integrated Behavioral Health Follow Up Visit  MRN: [id number] Name: [name]  Number of [facility] Clinician visits: 3/6 Session Start time: 8:35  Session End time: 9:10 Total time: 35 minutes  Reason for follow-up: Continue brief intervention to assist patient with managing symptoms of depression, as well as managing symptoms of stressors .  Report of symptoms: feeling overwhelmed with health concerns and caring for husband.  ASSESSMENT: Mood: Euthymic and Affect: Appropriate;Thought process: Coherent;  Patient continues to have concerns with her memory, she was engaged in conversation but often forgot her thought and needed a reminder of what we were discussing.  States she is doing better with planning which seems to help.  She has not been able to accomplish previous goals of going back to gym due to knee surgery scheduled 05/2618. Is hopeful this will help her move in the direction of getting back to the gym.  Patient has made arrangements for husband to go to a facility short term while she recuperates from surgery.  Patient may benefit from, and is in agreement to continue ongoing assessment and brief therapeutic interventions to assist with managing stress after the surgery.  GOALS:Patient will: 1. Reduce symptoms of: anxiety and stress 2. Increase knowledge and/or ability of: coping skills, healthy habits, self-management skills and stress reduction  3. Get more sleep each night and use CPAP PLAN:  1.Patient will F/U with [facility] in as needed 2.Behavioral recommendations: relaxed breathing 3 times daily 3. Will go to bed by 12:00 and use CPAP nightly  4. Will F/U with PCP 06/05/18 to discuss memory concerns Intervention: Solution-Focused Strategies, Supportive Counseling and Sleep Hygiene, Behavioral Therapy (Relaxed breathing); Psychoeducation on sleep, relaxation and benefits of using CPAP.  Casimer Lanius, [facility]    [phone] 9:21 AM

## 2018-05-27 NOTE — Telephone Encounter (Signed)
Can you print and find these card results thx

## 2018-05-28 ENCOUNTER — Other Ambulatory Visit: Payer: Self-pay

## 2018-05-28 DIAGNOSIS — E7800 Pure hypercholesterolemia, unspecified: Secondary | ICD-10-CM

## 2018-05-28 MED ORDER — ATORVASTATIN CALCIUM 40 MG PO TABS: 40.0000 mg | ORAL_TABLET | Freq: Every day | ORAL | 0 refills | Status: DC

## 2018-05-28 NOTE — Telephone Encounter (Signed)
On your desk per your request

## 2018-06-03 ENCOUNTER — Encounter: Payer: Self-pay | Admitting: Vascular Surgery

## 2018-06-03 ENCOUNTER — Ambulatory Visit: Payer: Medicare HMO | Admitting: Vascular Surgery

## 2018-06-03 ENCOUNTER — Other Ambulatory Visit: Payer: Self-pay

## 2018-06-03 VITALS — BP 187/80 | HR 56 | Temp 99.0°F | Resp 16 | Ht 68.0 in | Wt 228.0 lb

## 2018-06-03 DIAGNOSIS — I83811 Varicose veins of right lower extremities with pain: Secondary | ICD-10-CM

## 2018-06-03 NOTE — Progress Notes (Signed)
Patient is a 62 year old female who returns for follow-up today.  She was seen by my partner Dr. Donnetta Hutching 3 months ago.  Her primary complaint is varicose veins in her right leg.  She had an ulcer in her leg 3 or 4 years ago that took Unna boots to heal.  She has also had a previous bleeding episode from varicosities about 3 to 4 years ago.  In the remote past greater than 20 years ago she had what sounds like a laser ablation or formal stripping of her greater saphenous vein.  She has had recurrent varicosities since then.  Of note she is on Eliquis for atrial flutter.  She has also had a prior gastric bypass.  Her primary symptoms currently are fullness heaviness and achiness in the right leg.  This is worse after standing.  She occasionally gets some swelling.  She also has some occasional pain in the veins.  She has been compliant wearing her compression stockings over the last 3 months.  She has not really had great success in improving her symptoms with these.  Review of systems: She denies shortness of breath.  She denies chest pain.  Social History   Socioeconomic History  . Marital status: Married    Spouse name: Marya Amsler  . Number of children: 2  . Years of education: [employer]  . Highest education level: Not on file  Occupational History  . Occupation: Unemployed    Comment: disabled  Social Needs  . Financial resource strain: Not on file  . Food insecurity:    Worry: Not on file    Inability: Not on file  . Transportation needs:    Medical: Not on file    Non-medical: Not on file  Tobacco Use  . Smoking status: Former Smoker    Packs/day: 2.00    Years: 24.00    Pack years: 48.00    Types: Cigarettes    Start date: 06/24/1970    Last attempt to quit: 08/06/1994    Years since quitting: 23.8  . Smokeless tobacco: Never Used  Substance and Sexual Activity  . Alcohol use: No    Alcohol/week: 0.0 standard drinks  . Drug use: No  . Sexual activity: Never  Lifestyle  . Physical  activity:    Days per week: Not on file    Minutes per session: Not on file  . Stress: Not on file  Relationships  . Social connections:    Talks on phone: Not on file    Gets together: Not on file    Attends religious service: Not on file    Active member of club or organization: Not on file    Attends meetings of clubs or organizations: Not on file    Relationship status: Not on file  . Intimate partner violence:    Fear of current or ex partner: Not on file    Emotionally abused: Not on file    Physically abused: Not on file    Forced sexual activity: Not on file  Other Topics Concern  . Not on file  Social History Narrative   Married '91, husband has MS, wheelchair bound   [id number] x 2, 1 son '91 8.5lbs, 1 daughter '92 10lbs      Caffeine use- coffee 3-4 cups daily    Current Outpatient Medications on File Prior to Visit  Medication Sig Dispense Refill  . amLODipine (NORVASC) 10 MG tablet Take 1 tablet (10 mg total) by mouth daily. 90 tablet 1  .  Apixaban (ELIQUIS PO) Take by mouth 2 (two) times daily.    Marland Kitchen atorvastatin (LIPITOR) 40 MG tablet Take 1 tablet (40 mg total) by mouth daily. 90 tablet 0  . Blood Glucose Monitoring Suppl (ACCU-CHEK AVIVA PLUS) w/Device KIT Use to check sugar daily 1 kit 0  . Blood Glucose Monitoring Suppl (ACCU-CHEK NANO SMARTVIEW) w/Device KIT Use to check blood glucose four times a day 1 kit 0  . gabapentin (NEURONTIN) 100 MG capsule Take 100 mg by mouth 2 (two) times daily.    Marland Kitchen lisinopril-hydrochlorothiazide (PRINZIDE,ZESTORETIC) 20-25 MG tablet Take 1 tablet by mouth daily. 90 tablet 3  . multivitamin-iron-minerals-folic acid (CENTRUM) chewable tablet Chew 4 tablets by mouth daily.    . sertraline (ZOLOFT) 50 MG tablet Take 1.5 tablets (75 mg total) by mouth daily. 45 tablet 0  . Teduglutide, rDNA, (GATTEX) 5 MG KIT Inject 5 mg into the skin daily.    Marland Kitchen aspirin 81 MG tablet Take 1 tablet (81 mg total) by mouth daily. (Patient not taking:  Reported on 06/03/2018) 90 tablet 0  . gabapentin (NEURONTIN) 300 MG capsule TAKE 1 CAPSULE BY MOUTH THREE TIMES A DAY (Patient taking differently: at bedtime. ) 270 capsule 1  . glucose blood (ACCU-CHEK AVIVA PLUS) test strip Use to check sugar once daily in the morning 100 each 12  . Lancets (ACCU-CHEK SOFT TOUCH) lancets Use to check sugar daily 100 each 12  . [DISCONTINUED] Alum & Mag Hydroxide-Simeth (MAGIC MOUTHWASH W/LIDOCAINE) SOLN Take 5 mLs by mouth 3 (three) times daily as needed. 100 mL 0   No current facility-administered medications on file prior to visit.    Allergies  Allergen Reactions  . Cephalexin Hives and Rash    Denies Airway involvement  . Latex Rash  . Neomycin-Bacitracin Zn-Polymyx Rash  . Tape Rash    Latex Prefers paper tape    Physical exam:  Vitals:   06/03/18 1549  BP: (!) 187/80  Pulse: (!) 56  Resp: 16  Temp: 99 F (37.2 C)  TempSrc: Oral  SpO2: 96%  Weight: 228 lb (103.4 kg)  Height: _0  (1.727 m)    Extremities: 1+ dorsalis pedis pulse bilaterally, trace edema right greater than left  Skin: No ulcerations, brawny circumferential staining right gaiter area      Data: I reviewed the patient's previous duplex ultrasound which showed absent greater saphenous to the distal thigh.  Lesser saphenous vein was less than 3 mm.  No evidence of DVT.  No deep vein reflux.  Assessment: Recurrent varicose veins with symptomatic with pain and swelling and prior ulceration and bleeding  This has not been responsive to conservative management with compression.  Plan: The patient has a pending left total joint replacement.  She will call us after she has recovered from that for consideration of stabs in the right leg.  This will be pending insurance approval.  She understands.  Procedure details risk benefits were discussed with the patient today.  [name], MD [facility] Office: [phone] Pager:  [phone]

## 2018-06-05 ENCOUNTER — Ambulatory Visit (INDEPENDENT_AMBULATORY_CARE_PROVIDER_SITE_OTHER): Payer: Medicare HMO | Admitting: Family Medicine

## 2018-06-05 ENCOUNTER — Other Ambulatory Visit: Payer: Self-pay

## 2018-06-05 ENCOUNTER — Encounter: Payer: Self-pay | Admitting: Family Medicine

## 2018-06-05 VITALS — BP 155/70 | HR 51 | Temp 98.7°F

## 2018-06-05 DIAGNOSIS — I10 Essential (primary) hypertension: Secondary | ICD-10-CM

## 2018-06-05 DIAGNOSIS — G3184 Mild cognitive impairment, so stated: Secondary | ICD-10-CM

## 2018-06-05 NOTE — Patient Instructions (Addendum)
  Nice to see you today!  Please try to stay active and get exercise. Continue doing your brain teaser activities.   We'll see you back in about 3 months unless you need [place] sooner, but everything looks good and I have no concerns.  If you go back to see your eye doctor please let ask his office to send [place] a report.   If you have questions or concerns please do not hesitate to call at [phone].  Lucila Maine, DO PGY-3, [facility] 06/05/2018 9:10 AM

## 2018-06-05 NOTE — Assessment & Plan Note (Signed)
  Discussed normal lab results with patient from last month's work up to r/o organic cause of memory impairment. Encouraged staying active, social, doing mental exercises, controlling BP and DM. Reviewed medications with patient and provided reassurance. Continue working with behavioral health for anxiety and depression. Plan for follow up 3 months.

## 2018-06-05 NOTE — Progress Notes (Signed)
    Subjective:    Patient ID: [name], female    DOB: 1956/06/17, 62 y.o.   MRN: [id number]   CC: go over medications  HPI: Patient coming in today for follow up. She would like to review her medicines and brought these with her. She is very nervous about her medications and wants to make sure she is taking them properly.   HTN- did not take her BP medication today yet. She denies CP, SOB, DOE, headaches, vision changes.  Memory- continues to complain of forgetting things daily, again cites going to a room for something and forgetting what.   Smoking status reviewed- former smoker  Review of Systems- see HPI   Objective:  BP (!) 155/70   Pulse (!) 51   Temp 98.7 F (37.1 C) (Oral)   SpO2 97%  Vitals and nursing note reviewed  General: well nourished, in no acute distress HEENT: normocephalic, TM's clear bilaterally, MMM Cardiac: RRR, clear S1 and S2, no murmurs, rubs, or gallops Respiratory: clear to auscultation bilaterally, no increased work of breathing Abdomen: soft, nontender, nondistended Neuro: alert and oriented, no focal deficits   Assessment & Plan:    Mild cognitive impairment  Discussed normal lab results with patient from last month's work up to r/o organic cause of memory impairment. Encouraged staying active, social, doing mental exercises, controlling BP and DM. Reviewed medications with patient and provided reassurance. Continue working with behavioral health for anxiety and depression. Plan for follow up 3 months.   Essential hypertension  Not at goal today but patient has not taken BP medications, she is on norvasc and lisinopril-hctz. Reviewed these with her today. I expect that when she takes these she will be at goal of <140/90. Follow up 3 months.     Return in about 3 months (around 09/04/2018).   Lucila Maine, DO Family Medicine Resident PGY-3

## 2018-06-05 NOTE — Assessment & Plan Note (Signed)
  Not at goal today but patient has not taken BP medications, she is on norvasc and lisinopril-hctz. Reviewed these with her today. I expect that when she takes these she will be at goal of <140/90. Follow up 3 months.

## 2018-06-08 NOTE — Telephone Encounter (Signed)
I called and left message.  She would need to stop her blood thinner for days before surgery.  We would restart her probably the following morning.  Is there anything else left to do at this time.

## 2018-06-10 ENCOUNTER — Other Ambulatory Visit: Payer: Self-pay | Admitting: Family Medicine

## 2018-06-10 NOTE — Telephone Encounter (Signed)
Pt would like to have the Gabapentin 157m refilled and sent to her pharmacy. She is completely out.

## 2018-06-12 ENCOUNTER — Other Ambulatory Visit (INDEPENDENT_AMBULATORY_CARE_PROVIDER_SITE_OTHER): Payer: Self-pay | Admitting: Orthopedic Surgery

## 2018-06-12 DIAGNOSIS — M1712 Unilateral primary osteoarthritis, left knee: Secondary | ICD-10-CM

## 2018-06-12 MED ORDER — GABAPENTIN 100 MG PO CAPS: 100.0000 mg | ORAL_CAPSULE | Freq: Two times a day (BID) | ORAL | 3 refills | Status: DC

## 2018-06-15 DIAGNOSIS — I10 Essential (primary) hypertension: Secondary | ICD-10-CM | POA: Diagnosis not present

## 2018-06-15 DIAGNOSIS — G4710 Hypersomnia, unspecified: Secondary | ICD-10-CM | POA: Diagnosis not present

## 2018-06-15 DIAGNOSIS — G4733 Obstructive sleep apnea (adult) (pediatric): Secondary | ICD-10-CM | POA: Diagnosis not present

## 2018-06-16 NOTE — Pre-Procedure Instructions (Signed)
[name]  06/16/2018      [id number] [id number]: [phone]: [address] [place]Phone: [phone]: [phone]   Your procedure is scheduled on June 30, 2018.  [facility].  Call this number if you have problems the morning of surgery:  [phone]  Remember:  Do not eat or drink after midnight.    Take these medicines the morning of surgery with A SIP OF WATER  amlodipine (norvasc) Gabapentin (neurontin) Sertraline (zoloft)  Last dose of Eliquis will be 06/26/2018-3 days prior to procedure per MD orders  Follow your surgeon's instructions on when to hold/resume aspirin.  If no instructions were given call the office to determine how they would like to you take aspirin-ON HOLD   WHAT DO I DO ABOUT MY DIABETES MEDICATION?  .Marland KitchenDo not take oral diabetes medicines (pills) the morning of surgery.  Reviewed and Endorsed by [facility] Education [facility], August 2015   How to Manage Your Diabetes Before and After Surgery  Why is it important to control my blood sugar before and after surgery? . Improving blood sugar levels before and after surgery helps healing and can limit problems. . A way of improving blood sugar control is eating a healthy diet by: o  Eating less sugar and carbohydrates o  Increasing activity/exercise o  Talking with your doctor about reaching your blood sugar goals . High blood sugars (greater than 180 mg/dL) can raise your risk of infections and slow your recovery, so you will need to focus on controlling your diabetes during the weeks before surgery. . Make sure that the doctor who takes care of your diabetes knows about your planned surgery including the date and  location.  How do I manage my blood sugar before surgery? . Check your blood sugar at least 4 times a day, starting 2 days before surgery, to make sure that the level is not too high or low. o Check your blood sugar the morning of your surgery when you wake up and every 2 hours until you get to the Short Stay unit. . If your blood sugar is less than 70 mg/dL, you will need to treat for low blood sugar: o Do not take insulin. o Treat a low blood sugar (less than 70 mg/dL) with  cup of clear juice (cranberry or apple), 4 glucose tablets, OR glucose gel. Recheck blood sugar in 15 minutes after treatment (to make sure it is greater than 70 mg/dL). If your blood sugar is not greater than 70 mg/dL on recheck, call [phone]  for further instructions. . Report your blood sugar to the short stay nurse when you get to Short Stay.  . If you are admitted to the hospital after surgery: o Your blood sugar will be checked by the staff and you will probably be given insulin after surgery (instead of oral diabetes medicines) to make sure you have good blood sugar levels. o The goal for blood sugar control after surgery is 80-180 mg/dL.   [place]- Preparing For Surgery  Before surgery, you can play an important role. Because skin is not sterile, your skin needs to be as free of germs as possible. You can reduce  the number of germs on your skin by washing with CHG (chlorahexidine gluconate) Soap before surgery.  CHG is an antiseptic cleaner which kills germs and bonds with the skin to continue killing germs even after washing.    Oral Hygiene is also important to reduce your risk of infection.  Remember - BRUSH YOUR TEETH THE MORNING OF SURGERY WITH YOUR REGULAR TOOTHPASTE  Please do not use if you have an allergy to CHG or antibacterial soaps. If your skin becomes reddened/irritated stop using the CHG.  Do not shave (including legs and underarms) for at least 48 hours prior to first CHG shower. It  is OK to shave your face.  Please follow these instructions carefully.   1. Shower the NIGHT BEFORE SURGERY and the MORNING OF SURGERY with CHG.   2. If you chose to wash your hair, wash your hair first as usual with your normal shampoo.  3. After you shampoo, rinse your hair and body thoroughly to remove the shampoo.  4. Use CHG as you would any other liquid soap. You can apply CHG directly to the skin and wash gently with a scrungie or a clean washcloth.   5. Apply the CHG Soap to your body ONLY FROM THE NECK DOWN.  Do not use on open wounds or open sores. Avoid contact with your eyes, ears, mouth and genitals (private parts). Wash Face and genitals (private parts)  with your normal soap.  6. Wash thoroughly, paying special attention to the area where your surgery will be performed.  7. Thoroughly rinse your body with warm water from the neck down.  8. DO NOT shower/wash with your normal soap after using and rinsing off the CHG Soap.  9. Pat yourself dry with a CLEAN TOWEL.  10. Wear CLEAN PAJAMAS to bed the night before surgery, wear comfortable clothes the morning of surgery  11. Place CLEAN SHEETS on your bed the night of your first shower and DO NOT SLEEP WITH PETS.  Day of Surgery:  Do not apply any deodorants/lotions.  Please wear clean clothes to the hospital/surgery center.   Remember to brush your teeth WITH YOUR REGULAR TOOTHPASTE.   Do not wear jewelry, make-up or nail polish.  Do not wear lotions, powders, or perfumes, or deodorant.  Do not shave 48 hours prior to surgery.  Men may shave face and neck.  Do not bring valuables to the hospital.  [facility] is not responsible for any belongings or valuables.  Contacts, dentures or bridgework may not be worn into surgery.  Leave your suitcase in the car.  After surgery it may be brought to your room.  For patients admitted to the hospital, discharge time will be determined by your treatment team.  Patients  discharged the day of surgery will not be allowed to drive home.    Please read over the following fact sheets that you were given.

## 2018-06-18 ENCOUNTER — Inpatient Hospital Stay (HOSPITAL_COMMUNITY)
Admission: RE | Admit: 2018-06-18 | Discharge: 2018-06-18 | Disposition: A | Discharge status: Home / Self Care | Payer: Medicare HMO | Source: Ambulatory Visit

## 2018-06-18 NOTE — Progress Notes (Addendum)
Patient forgot appointment, rescheduled for tomorrow

## 2018-06-19 ENCOUNTER — Encounter (HOSPITAL_COMMUNITY)
Admission: RE | Admit: 2018-06-19 | Discharge: 2018-06-19 | Disposition: A | Discharge status: Home / Self Care | Payer: Medicare HMO | Source: Ambulatory Visit | Attending: Orthopedic Surgery | Admitting: Orthopedic Surgery

## 2018-06-19 ENCOUNTER — Other Ambulatory Visit: Payer: Self-pay

## 2018-06-19 ENCOUNTER — Encounter (HOSPITAL_COMMUNITY): Payer: Self-pay

## 2018-06-19 DIAGNOSIS — N289 Disorder of kidney and ureter, unspecified: Secondary | ICD-10-CM | POA: Diagnosis not present

## 2018-06-19 DIAGNOSIS — M1712 Unilateral primary osteoarthritis, left knee: Secondary | ICD-10-CM | POA: Insufficient documentation

## 2018-06-19 DIAGNOSIS — E1142 Type 2 diabetes mellitus with diabetic polyneuropathy: Secondary | ICD-10-CM | POA: Insufficient documentation

## 2018-06-19 DIAGNOSIS — I10 Essential (primary) hypertension: Secondary | ICD-10-CM | POA: Insufficient documentation

## 2018-06-19 DIAGNOSIS — E1151 Type 2 diabetes mellitus with diabetic peripheral angiopathy without gangrene: Secondary | ICD-10-CM | POA: Insufficient documentation

## 2018-06-19 DIAGNOSIS — G4733 Obstructive sleep apnea (adult) (pediatric): Secondary | ICD-10-CM | POA: Diagnosis not present

## 2018-06-19 DIAGNOSIS — K7581 Nonalcoholic steatohepatitis (NASH): Secondary | ICD-10-CM | POA: Insufficient documentation

## 2018-06-19 DIAGNOSIS — Z7901 Long term (current) use of anticoagulants: Secondary | ICD-10-CM | POA: Insufficient documentation

## 2018-06-19 DIAGNOSIS — K219 Gastro-esophageal reflux disease without esophagitis: Secondary | ICD-10-CM | POA: Insufficient documentation

## 2018-06-19 DIAGNOSIS — Z01812 Encounter for preprocedural laboratory examination: Secondary | ICD-10-CM | POA: Insufficient documentation

## 2018-06-19 DIAGNOSIS — Z7982 Long term (current) use of aspirin: Secondary | ICD-10-CM | POA: Diagnosis not present

## 2018-06-19 DIAGNOSIS — Z79899 Other long term (current) drug therapy: Secondary | ICD-10-CM | POA: Insufficient documentation

## 2018-06-19 DIAGNOSIS — Z9884 Bariatric surgery status: Secondary | ICD-10-CM | POA: Diagnosis not present

## 2018-06-19 HISTORY — DX: Peripheral vascular disease, unspecified: I73.9

## 2018-06-19 HISTORY — DX: Type 2 diabetes mellitus with diabetic neuropathy, unspecified: E11.40

## 2018-06-19 HISTORY — DX: Headache, unspecified: R51.9

## 2018-06-19 HISTORY — DX: Complete loss of teeth, unspecified cause, unspecified class: K08.109

## 2018-06-19 HISTORY — DX: Presence of spectacles and contact lenses: Z97.3

## 2018-06-19 HISTORY — DX: Gastro-esophageal reflux disease without esophagitis: K21.9

## 2018-06-19 HISTORY — DX: Unspecified osteoarthritis, unspecified site: M19.90

## 2018-06-19 HISTORY — DX: Headache: R51

## 2018-06-19 HISTORY — DX: Presence of dental prosthetic device (complete) (partial): Z97.2

## 2018-06-19 HISTORY — DX: Pneumonia, unspecified organism: J18.9

## 2018-06-19 LAB — BASIC METABOLIC PANEL
Anion gap: 8 (ref 5–15)
BUN: 22 mg/dL (ref 8–23)
CO2: 24 mmol/L (ref 22–32)
Calcium: 10.4 mg/dL — ABNORMAL HIGH (ref 8.9–10.3)
Chloride: 108 mmol/L (ref 98–111)
Creatinine, Ser: 1.33 mg/dL — ABNORMAL HIGH (ref 0.44–1.00)
GFR calc Af Amer: 49 mL/min — ABNORMAL LOW (ref >60)
GFR calc non Af Amer: 42 mL/min — ABNORMAL LOW (ref >60)
Glucose, Bld: 139 mg/dL — ABNORMAL HIGH (ref 70–99)
Potassium: 4.4 mmol/L (ref 3.5–5.1)
Sodium: 140 mmol/L (ref 135–145)

## 2018-06-19 LAB — URINALYSIS, ROUTINE W REFLEX MICROSCOPIC
Bilirubin Urine: NEGATIVE
GLUCOSE, UA: NEGATIVE mg/dL
Hgb urine dipstick: NEGATIVE
Ketones, ur: 5 mg/dL — AB
Leukocytes, UA: NEGATIVE
Nitrite: NEGATIVE
PROTEIN: NEGATIVE mg/dL
Specific Gravity, Urine: 1.023 (ref 1.005–1.030)
pH: 5 (ref 5.0–8.0)

## 2018-06-19 LAB — CBC
HCT: 41.2 % (ref 36.0–46.0)
Hemoglobin: 12.7 g/dL (ref 12.0–15.0)
MCH: 25.7 pg — ABNORMAL LOW (ref 26.0–34.0)
MCHC: 30.8 g/dL (ref 30.0–36.0)
MCV: 83.4 fL (ref 80.0–100.0)
Platelets: 219 10*3/uL (ref 150–400)
RBC: 4.94 MIL/uL (ref 3.87–5.11)
RDW: 13.2 % (ref 11.5–15.5)
WBC: 7.8 10*3/uL (ref 4.0–10.5)
nRBC: 0 % (ref 0.0–0.2)

## 2018-06-19 LAB — GLUCOSE, CAPILLARY: Glucose-Capillary: 146 mg/dL — ABNORMAL HIGH (ref 70–99)

## 2018-06-19 LAB — SURGICAL PCR SCREEN
MRSA, PCR: NEGATIVE
Staphylococcus aureus: NEGATIVE

## 2018-06-19 NOTE — Progress Notes (Signed)
Pt denies SOB and chest pain. Pt stated that she is under the care of Dr. Dion Saucier, Cardiology at [facility]; [place] note,  EKG tracing and CXR requested. Pt denies having a cardiac cath. Pt not sure if a chest x ray was performed in the last year; requested that I check with cardiologist. Pt denies recent labs. Pt stated that she was instructed to stop taking Eliquis " I have it written at home." Pt verbalized understanding of all pre-op instructions.  Pt chart forwarded to PA, Anesthesiology, for review of cardiac clearance note on chart.

## 2018-06-20 LAB — URINE CULTURE: Culture: NO GROWTH

## 2018-06-22 NOTE — Anesthesia Preprocedure Evaluation (Addendum)
Anesthesia Evaluation  Patient identified by MRN, date of birth, ID band Patient awake    Reviewed: Allergy & Precautions, NPO status , Patient's Chart, lab work & pertinent test results  History of Anesthesia Complications Negative for: history of anesthetic complications  Airway Mallampati: II  TM Distance: >3 FB Neck ROM: Full    Dental no notable dental hx.    Pulmonary sleep apnea , former smoker,    Pulmonary exam normal        Cardiovascular hypertension, Normal cardiovascular exam+ dysrhythmias Atrial Fibrillation      Neuro/Psych PSYCHIATRIC DISORDERS Anxiety negative neurological ROS     GI/Hepatic GERD  ,(+) Hepatitis - (NASH)  Endo/Other  diabetes  Renal/GU Renal InsufficiencyRenal disease  negative genitourinary   Musculoskeletal  (+) Arthritis ,   Abdominal   Peds  Hematology negative hematology ROS (+)   Anesthesia Other Findings 62 yo female former smoker. Pertinent hx includes HTN, DMII, OSA, Paroxysmal Afib, Bradycardia, NASH, GERD, Renal insufficiency, S/p gastric bypass.  Pt follows with cardiologist in [place], Dr. Darrin Nipper, for paroxysmal afib. Per last OV note 05/11/2018: "Preop cardiovascular exam- the patient is requesting preoperative clearance prior to orthopedic surgery.  She can hold her Eliquis 2 to 4 days prior to surgery.  2 days should be adequate.  She has normal left ventricular systolic function.  Her Cardiolite stress test did not show any evidence of active ischemia or prior infarction." Cardiac clearance letter stating pt low risk on chart.  Reproductive/Obstetrics                           Anesthesia Physical Anesthesia Plan  ASA: III  Anesthesia Plan: General   Post-op Pain Management:    Induction: Intravenous  PONV Risk Score and Plan: 3 and Ondansetron, Dexamethasone, Midazolam and Treatment may vary due to age or medical  condition  Airway Management Planned: Oral ETT  Additional Equipment: None  Intra-op Plan:   Post-operative Plan: Extubation in OR  Informed Consent: I have reviewed the patients History and Physical, chart, labs and discussed the procedure including the risks, benefits and alternatives for the proposed anesthesia with the patient or authorized representative who has indicated his/her understanding and acceptance.   Dental advisory given  Plan Discussed with:   Anesthesia Plan Comments: (See PAT note 06/19/2018 by Karoline Caldwell, PA-C )      Anesthesia Quick Evaluation

## 2018-06-22 NOTE — Progress Notes (Signed)
Anesthesia Chart Review:  Case:  [id number] Date/Time:  06/30/18 1044   Procedure:  LEFT TOTAL KNEE ARTHROPLASTY (Left )   Anesthesia type:  General   Pre-op diagnosis:  left knee osteoarthritis   Location:  [facility] OR ROOM 05 / [place] [facility]   Surgeon:  Meredith Pel, MD      DISCUSSION: 62 yo female former smoker. Pertinent hx includes HTN, DMII, OSA, Paroxysmal Afib, Bradycardia, NASH, GERD, Renal insufficiency, S/p gastric bypass.  Pt follows with cardiologist in [place], Dr. Darrin Nipper, for paroxysmal afib. Per last OV note 05/11/2018: " Preop cardiovascular exam- the patient is requesting preoperative clearance prior to orthopedic surgery.  She can hold her Eliquis 2 to 4 days prior to surgery.  2 days should be adequate.  She has normal left ventricular systolic function.  Her Cardiolite stress test did not show any evidence of active ischemia or prior infarction."  Cardiac clearance letter stating pt low risk on chart.  Anticipate she can proceed as planned barring acute status change.  VS: BP (!) 119/49   Pulse 60   Temp 37.1 C (Oral)   Resp 18   Ht 5' 7.5" (1.715 m)   Wt 101.8 kg   SpO2 97%   BMI 34.63 kg/m   PROVIDERS: Steve Rattler, DO is PCP  Darrin Nipper, MD is Cardiologist  LABS: Labs reviewed: Acceptable for surgery. (all labs ordered are listed, but only abnormal results are displayed)  Labs Reviewed  GLUCOSE, CAPILLARY - Abnormal; Notable for the following components:      Result Value   Glucose-Capillary 146 (*)    All other components within normal limits  BASIC METABOLIC PANEL - Abnormal; Notable for the following components:   Glucose, Bld 139 (*)    Creatinine, Ser 1.33 (*)    Calcium 10.4 (*)    GFR calc non Af Amer 42 (*)    GFR calc Af Amer 49 (*)    All other components within normal limits  CBC - Abnormal; Notable for the following components:   MCH 25.7 (*)    All other components within normal limits  URINALYSIS, ROUTINE W REFLEX  MICROSCOPIC - Abnormal; Notable for the following components:   Ketones, ur 5 (*)    All other components within normal limits  URINE CULTURE  SURGICAL PCR SCREEN     IMAGES: CT Abd/Pelvis 01/13/2017: IMPRESSION: 1. Small fat containing umbilical hernia. Interstitial stranding in the adjacent subcutaneous fat suggests superimposed inflammation. 2. Surgical changes of prior gastric bypass without evidence of complication or obstruction. 3. Small hiatal hernia. 4. Borderline cardiomegaly. 5.  Aortic Atherosclerosis (ICD10-170.0) 6. Multilevel degenerative disc disease most significant at L2-L3.  EKG: 05/11/2018: Sinus bradycardia at 52 beats per minute. The axis is 59. There are Q waves in leads V1 to V3 4 to prior anteroseptal myocardial infarction cannot be excluded. There is nonspecific 0.5 millimeters of ST depression in the inferior and anterolateral leads.   CV: Lexiscan Cardiolite stress test 04/08/2018 (care everywhere):  1. LV EF was 52% 2. Global left ventricular systolic function was , with an EF of 52%.Negative study for ischemia or infarct.  30 day event monitor completed March 14, 2018 (care everywhere): 1. The predominant rhythm was sinus. The patient's heart rate during the monitoring period ranged from 22-141 beats per minute. The average heart rate was around 60 beats per minute. 2. No significant ventricular ectopy was noted. 3. Patient was noted to have one episode of atrial fibrillation lasting  8 hours and 39 minutes with a maximum heart rate of 126 beats per minute, minimum heart rate of 37 beats per minute, and average heart rate of 68 beats per minute. No other supraventricular ectopy was noted. 4. First-degree AV block was noted. There was one sinus pause up to 2.7 seconds in length. 5. No symptoms were documented.  Recommendations: There is no indication for immediate permanent pacemaker placement. The patient had an approximately 2% burden  of atrial fibrillation lasting up to 8 hours and 39 minutes. With female gender approaching age 52 and history of hypertension with suggest anticoagulation rather than antiplatelet therapy. Would suggest changing low-dose aspirin to Eliquis 5 milligrams twice a day. Consider Lexiscan Cardilate stress test to evaluate for any ischemic etiology of her atrial fibrillation. Would avoid any negative chronotropic agents at this time.  Summary Echo 02/06/2018 (care everywhere):  1. The left ventricular chamber size is mildly dilated. 2. Left ventricle septal thickness is normal. 3. The ejection fraction is estimated to be 55%. 4. Abnormal left ventricular diastolic filling is observed, consistent with impaired LV relaxation(Stage I). 5. The left atrium is mildly dilated. 6. There is a trivial amount of mitral regurgitation. 7. There is trivial tricuspid regurgitation. 8. The right ventricular systolic pressure is calculated at 55mHg.   Past Medical History:  Diagnosis Date  . Allergy   . ANEMIA, PERNICIOUS, HX OF 05/13/2007  . Anxiety   . Arthritis   . ASYMPTOMATIC POSTMENOPAUSAL STATUS 02/18/2008  . BACK PAIN, LUMBAR 11/19/2007  . Bowel incontinence   . Cataract   . Cellulitis of left lower leg 10/28/2013  . Cellulitis of leg, right 10/11/2010  . DEPRESSION, CHRONIC 10/06/2007  . Diabetes mellitus without complication ([place]   . DIABETES MELLITUS, WITH NEUROLOGICAL COMPLICATIONS 83/79/0240 . Diabetic neuropathy ([place]   . DYSLIPIDEMIA 05/13/2007  . Family history of colon cancer   . Fecal soiling 06/28/2014  . Frozen shoulder    Lt  . Full dentures   . GERD (gastroesophageal reflux disease)   . GOITER, MULTINODULAR 05/13/2007  . Headache   . Hx of colonic polyps 12/04/2010  . HYPERCHOLESTEROLEMIA 03/20/2010  . HYPERTENSION 03/20/2010  . Lower back pain   . NASH (nonalcoholic steatohepatitis)   . OA (osteoarthritis)   . OBSTRUCTIVE SLEEP APNEA 06/12/2007   uses CPAP.  .Marland KitchenPERIPHERAL  NEUROPATHY 01/12/2009  . Peripheral vascular disease ([place]   . Pneumonia   . PSORIASIS 05/28/2010  . Renal insufficiency    stage 1 kd  . Sleep apnea   . UNSPECIFIED VENOUS INSUFFICIENCY 05/28/2010  . Wears glasses     Past Surgical History:  Procedure Laterality Date  . CATARACT EXTRACTION W/ INTRAOCULAR LENS  IMPLANT, BILATERAL    . COLONOSCOPY    . ELECTROCARDIOGRAM  04/16/2006  . EXAMINATION UNDER ANESTHESIA N/A 06/29/2014   Procedure: EXAM UNDER ANESTHESIA;  Surgeon: JJanyth Contes MD;  [facility];  Service: Gynecology;  Laterality: N/A;  . Exercise myoview  01/24/2005  . FLEXIBLE SIGMOIDOSCOPY    . GASTRIC BYPASS  11/09/2013  . GASTRIC BYPASS    . KNEE ARTHROSCOPY  2003   right  . LAPAROSCOPY N/A 03/12/2016   Procedure: LAPAROSCOPIC ANASTOMOSIS OF INTESTINE (ENTEROENTEROSTOMY);  Surgeon: MLadora Daniel MD;  Location: [facility] ORS;  Service: General;  Laterality: N/A;  . LEG SURGERY     metal and pins in lower left leg  . mrsa Right    arm  . MULTIPLE TOOTH EXTRACTIONS    .  ROTATOR CUFF REPAIR  2008   right  . SHOULDER ARTHROSCOPY W/ ROTATOR CUFF REPAIR Right   . TONSILLECTOMY    . VARICOSE VEIN SURGERY     Remotef    MEDICATIONS: . amLODipine (NORVASC) 10 MG tablet  . apixaban (ELIQUIS) 5 MG TABS tablet  . aspirin 81 MG tablet  . atorvastatin (LIPITOR) 40 MG tablet  . Blood Glucose Monitoring Suppl (ACCU-CHEK AVIVA PLUS) w/Device KIT  . Blood Glucose Monitoring Suppl (ACCU-CHEK NANO SMARTVIEW) w/Device KIT  . gabapentin (NEURONTIN) 100 MG capsule  . gabapentin (NEURONTIN) 300 MG capsule  . glucose blood (ACCU-CHEK AVIVA PLUS) test strip  . Lancets (ACCU-CHEK SOFT TOUCH) lancets  . lisinopril-hydrochlorothiazide (PRINZIDE,ZESTORETIC) 20-25 MG tablet  . multivitamin-iron-minerals-folic acid (CENTRUM) chewable tablet  . sertraline (ZOLOFT) 25 MG tablet  . sertraline (ZOLOFT) 50 MG tablet  . Teduglutide, rDNA, (GATTEX) 5 MG KIT   No current  facility-administered medications for this encounter.      Wynonia Musty [facility] Short Stay Center/Anesthesiology Phone [phone] 06/22/2018 12:44 PM

## 2018-06-29 MED ORDER — VANCOMYCIN HCL 10 G IV SOLR
1500.0000 mg | INTRAVENOUS | Status: DC
  Filled 2018-06-29: qty 1500

## 2018-06-30 ENCOUNTER — Other Ambulatory Visit: Payer: Self-pay

## 2018-06-30 ENCOUNTER — Inpatient Hospital Stay (HOSPITAL_COMMUNITY): Payer: Medicare HMO | Admitting: Physician Assistant

## 2018-06-30 ENCOUNTER — Inpatient Hospital Stay (HOSPITAL_COMMUNITY): Payer: Medicare HMO | Admitting: Certified Registered Nurse Anesthetist

## 2018-06-30 ENCOUNTER — Inpatient Hospital Stay (HOSPITAL_COMMUNITY)
Admission: RE | Admit: 2018-06-30 | Discharge: 2018-07-06 | DRG: 470 | Disposition: A | Discharge status: Skilled Nursing Facility | Payer: Medicare HMO | Attending: Orthopedic Surgery | Admitting: Orthopedic Surgery

## 2018-06-30 ENCOUNTER — Encounter (HOSPITAL_COMMUNITY)
Admission: RE | Disposition: A | Discharge status: Skilled Nursing Facility | Payer: Self-pay | Source: Home / Self Care | Attending: Orthopedic Surgery

## 2018-06-30 ENCOUNTER — Encounter (HOSPITAL_COMMUNITY): Payer: Self-pay | Admitting: *Deleted

## 2018-06-30 DIAGNOSIS — M2100 Valgus deformity, not elsewhere classified, unspecified site: Secondary | ICD-10-CM | POA: Diagnosis not present

## 2018-06-30 DIAGNOSIS — Z91040 Latex allergy status: Secondary | ICD-10-CM | POA: Diagnosis not present

## 2018-06-30 DIAGNOSIS — M2550 Pain in unspecified joint: Secondary | ICD-10-CM | POA: Diagnosis not present

## 2018-06-30 DIAGNOSIS — E1159 Type 2 diabetes mellitus with other circulatory complications: Secondary | ICD-10-CM | POA: Diagnosis not present

## 2018-06-30 DIAGNOSIS — I2510 Atherosclerotic heart disease of native coronary artery without angina pectoris: Secondary | ICD-10-CM | POA: Diagnosis not present

## 2018-06-30 DIAGNOSIS — G8918 Other acute postprocedural pain: Secondary | ICD-10-CM | POA: Diagnosis not present

## 2018-06-30 DIAGNOSIS — E1140 Type 2 diabetes mellitus with diabetic neuropathy, unspecified: Secondary | ICD-10-CM | POA: Diagnosis present

## 2018-06-30 DIAGNOSIS — E7800 Pure hypercholesterolemia, unspecified: Secondary | ICD-10-CM | POA: Diagnosis not present

## 2018-06-30 DIAGNOSIS — Z8249 Family history of ischemic heart disease and other diseases of the circulatory system: Secondary | ICD-10-CM | POA: Diagnosis not present

## 2018-06-30 DIAGNOSIS — E669 Obesity, unspecified: Secondary | ICD-10-CM | POA: Diagnosis not present

## 2018-06-30 DIAGNOSIS — I480 Paroxysmal atrial fibrillation: Secondary | ICD-10-CM | POA: Diagnosis not present

## 2018-06-30 DIAGNOSIS — M1710 Unilateral primary osteoarthritis, unspecified knee: Secondary | ICD-10-CM | POA: Diagnosis present

## 2018-06-30 DIAGNOSIS — Z96652 Presence of left artificial knee joint: Secondary | ICD-10-CM | POA: Diagnosis not present

## 2018-06-30 DIAGNOSIS — E1169 Type 2 diabetes mellitus with other specified complication: Secondary | ICD-10-CM | POA: Diagnosis not present

## 2018-06-30 DIAGNOSIS — M1712 Unilateral primary osteoarthritis, left knee: Principal | ICD-10-CM | POA: Diagnosis present

## 2018-06-30 DIAGNOSIS — Z9884 Bariatric surgery status: Secondary | ICD-10-CM | POA: Diagnosis not present

## 2018-06-30 DIAGNOSIS — Z471 Aftercare following joint replacement surgery: Secondary | ICD-10-CM | POA: Diagnosis not present

## 2018-06-30 DIAGNOSIS — Z7401 Bed confinement status: Secondary | ICD-10-CM | POA: Diagnosis not present

## 2018-06-30 DIAGNOSIS — F419 Anxiety disorder, unspecified: Secondary | ICD-10-CM | POA: Diagnosis present

## 2018-06-30 DIAGNOSIS — Z881 Allergy status to other antibiotic agents status: Secondary | ICD-10-CM

## 2018-06-30 DIAGNOSIS — Z6835 Body mass index (BMI) 35.0-35.9, adult: Secondary | ICD-10-CM | POA: Diagnosis not present

## 2018-06-30 DIAGNOSIS — M6281 Muscle weakness (generalized): Secondary | ICD-10-CM | POA: Diagnosis not present

## 2018-06-30 DIAGNOSIS — G4733 Obstructive sleep apnea (adult) (pediatric): Secondary | ICD-10-CM | POA: Diagnosis not present

## 2018-06-30 DIAGNOSIS — I10 Essential (primary) hypertension: Secondary | ICD-10-CM | POA: Diagnosis present

## 2018-06-30 DIAGNOSIS — Z87891 Personal history of nicotine dependence: Secondary | ICD-10-CM | POA: Diagnosis not present

## 2018-06-30 DIAGNOSIS — E782 Mixed hyperlipidemia: Secondary | ICD-10-CM | POA: Diagnosis not present

## 2018-06-30 HISTORY — PX: TOTAL KNEE ARTHROPLASTY: SHX125

## 2018-06-30 LAB — GLUCOSE, CAPILLARY
GLUCOSE-CAPILLARY: 166 mg/dL — AB (ref 70–99)
Glucose-Capillary: 134 mg/dL — ABNORMAL HIGH (ref 70–99)
Glucose-Capillary: 166 mg/dL — ABNORMAL HIGH (ref 70–99)

## 2018-06-30 SURGERY — ARTHROPLASTY, KNEE, TOTAL
Anesthesia: General | Site: Knee | Laterality: Left

## 2018-06-30 MED ORDER — MIDAZOLAM HCL 2 MG/2ML IJ SOLN
INTRAMUSCULAR | Status: AC
  Administered 2018-06-30: 2 mg
  Filled 2018-06-30: qty 2

## 2018-06-30 MED ORDER — HYDROMORPHONE HCL 1 MG/ML IJ SOLN: 0.5000 mg | INTRAMUSCULAR | Status: DC | PRN

## 2018-06-30 MED ORDER — ONDANSETRON HCL 4 MG PO TABS: 4.0000 mg | ORAL_TABLET | Freq: Four times a day (QID) | ORAL | Status: DC | PRN

## 2018-06-30 MED ORDER — GLYCOPYRROLATE PF 0.2 MG/ML IJ SOSY
PREFILLED_SYRINGE | INTRAMUSCULAR | Status: DC | PRN
  Administered 2018-06-30: .2 mg via INTRAVENOUS

## 2018-06-30 MED ORDER — FENTANYL CITRATE (PF) 250 MCG/5ML IJ SOLN
INTRAMUSCULAR | Status: DC | PRN
  Administered 2018-06-30: 50 ug via INTRAVENOUS
  Administered 2018-06-30: 100 ug via INTRAVENOUS
  Administered 2018-06-30: 50 ug via INTRAVENOUS
  Administered 2018-06-30: 100 ug via INTRAVENOUS

## 2018-06-30 MED ORDER — FENTANYL CITRATE (PF) 100 MCG/2ML IJ SOLN
INTRAMUSCULAR | Status: AC
  Administered 2018-06-30: 50 ug
  Filled 2018-06-30: qty 2

## 2018-06-30 MED ORDER — EPHEDRINE SULFATE-NACL 50-0.9 MG/10ML-% IV SOSY
PREFILLED_SYRINGE | INTRAVENOUS | Status: DC | PRN
  Administered 2018-06-30 (×3): 10 mg via INTRAVENOUS

## 2018-06-30 MED ORDER — TRANEXAMIC ACID-NACL 1000-0.7 MG/100ML-% IV SOLN
INTRAVENOUS | Status: DC | PRN
  Administered 2018-06-30: 1000 mg via INTRAVENOUS

## 2018-06-30 MED ORDER — METHOCARBAMOL 500 MG PO TABS
ORAL_TABLET | ORAL | Status: AC
  Administered 2018-06-30: 19:00:00
  Filled 2018-06-30: qty 1

## 2018-06-30 MED ORDER — BUPIVACAINE HCL (PF) 0.25 % IJ SOLN
INTRAMUSCULAR | Status: AC
  Filled 2018-06-30: qty 30

## 2018-06-30 MED ORDER — CHLORHEXIDINE GLUCONATE 4 % EX LIQD
60.0000 mL | Freq: Once | CUTANEOUS | Status: DC
  Administered 2018-06-30: 4 via TOPICAL

## 2018-06-30 MED ORDER — ONDANSETRON HCL 4 MG/2ML IJ SOLN
INTRAMUSCULAR | Status: AC
  Filled 2018-06-30: qty 2

## 2018-06-30 MED ORDER — OXYCODONE HCL 5 MG PO TABS
ORAL_TABLET | ORAL | Status: AC
  Administered 2018-06-30: 19:00:00
  Filled 2018-06-30: qty 3

## 2018-06-30 MED ORDER — FENTANYL CITRATE (PF) 100 MCG/2ML IJ SOLN
INTRAMUSCULAR | Status: AC
  Administered 2018-06-30: 19:00:00
  Filled 2018-06-30: qty 2

## 2018-06-30 MED ORDER — METOCLOPRAMIDE HCL 5 MG/ML IJ SOLN: 5.0000 mg | Freq: Three times a day (TID) | INTRAMUSCULAR | Status: DC | PRN

## 2018-06-30 MED ORDER — OXYCODONE HCL 5 MG/5ML PO SOLN: 5.0000 mg | Freq: Once | ORAL | Status: AC | PRN

## 2018-06-30 MED ORDER — LACTATED RINGERS IV SOLN
INTRAVENOUS | Status: DC
  Administered 2018-06-30: 10:00:00 via INTRAVENOUS

## 2018-06-30 MED ORDER — SODIUM CHLORIDE 0.9 % IR SOLN
Status: DC | PRN
  Administered 2018-06-30: 1

## 2018-06-30 MED ORDER — BUPIVACAINE LIPOSOME 1.3 % IJ SUSP
20.0000 mL | INTRAMUSCULAR | Status: AC
  Administered 2018-06-30: 20 mL
  Administered 2018-06-30: 266 mg
  Filled 2018-06-30: qty 20

## 2018-06-30 MED ORDER — FENTANYL CITRATE (PF) 100 MCG/2ML IJ SOLN
25.0000 ug | INTRAMUSCULAR | Status: DC | PRN
  Administered 2018-06-30 (×3): 50 ug via INTRAVENOUS

## 2018-06-30 MED ORDER — FENTANYL CITRATE (PF) 250 MCG/5ML IJ SOLN
INTRAMUSCULAR | Status: AC
  Filled 2018-06-30: qty 5

## 2018-06-30 MED ORDER — GABAPENTIN 300 MG PO CAPS
300.0000 mg | ORAL_CAPSULE | Freq: Three times a day (TID) | ORAL | Status: DC
  Administered 2018-06-30 – 2018-07-06 (×17): 300 mg via ORAL
  Filled 2018-06-30 (×17): qty 1

## 2018-06-30 MED ORDER — DOCUSATE SODIUM 100 MG PO CAPS
100.0000 mg | ORAL_CAPSULE | Freq: Two times a day (BID) | ORAL | Status: DC
  Administered 2018-07-01 – 2018-07-06 (×11): 100 mg via ORAL
  Filled 2018-06-30 (×11): qty 1

## 2018-06-30 MED ORDER — APIXABAN 2.5 MG PO TABS
2.5000 mg | ORAL_TABLET | Freq: Two times a day (BID) | ORAL | Status: DC
  Administered 2018-07-01 – 2018-07-02 (×4): 2.5 mg via ORAL
  Filled 2018-06-30 (×4): qty 1

## 2018-06-30 MED ORDER — ROCURONIUM BROMIDE 50 MG/5ML IV SOSY
PREFILLED_SYRINGE | INTRAVENOUS | Status: AC
  Filled 2018-06-30: qty 5

## 2018-06-30 MED ORDER — OXYCODONE HCL 5 MG PO TABS
5.0000 mg | ORAL_TABLET | Freq: Once | ORAL | Status: AC | PRN
  Administered 2018-06-30: 5 mg via ORAL

## 2018-06-30 MED ORDER — BUPIVACAINE-EPINEPHRINE 0.25% -1:200000 IJ SOLN
INTRAMUSCULAR | Status: AC
  Filled 2018-06-30: qty 1

## 2018-06-30 MED ORDER — METHOCARBAMOL 500 MG PO TABS
500.0000 mg | ORAL_TABLET | Freq: Four times a day (QID) | ORAL | Status: DC | PRN
  Administered 2018-06-30 – 2018-07-06 (×12): 500 mg via ORAL
  Filled 2018-06-30 (×11): qty 1

## 2018-06-30 MED ORDER — PROPOFOL 10 MG/ML IV BOLUS
INTRAVENOUS | Status: AC
  Filled 2018-06-30: qty 20

## 2018-06-30 MED ORDER — PHENOL 1.4 % MT LIQD: 1.0000 | OROMUCOSAL | Status: DC | PRN

## 2018-06-30 MED ORDER — ROPIVACAINE HCL 5 MG/ML IJ SOLN
INTRAMUSCULAR | Status: DC | PRN
  Administered 2018-06-30: 20 mL via PERINEURAL

## 2018-06-30 MED ORDER — 0.9 % SODIUM CHLORIDE (POUR BTL) OPTIME
TOPICAL | Status: DC | PRN
  Administered 2018-06-30: 1000 mL

## 2018-06-30 MED ORDER — ONDANSETRON HCL 4 MG/2ML IJ SOLN
INTRAMUSCULAR | Status: DC | PRN
  Administered 2018-06-30: 4 mg via INTRAVENOUS

## 2018-06-30 MED ORDER — ROCURONIUM BROMIDE 10 MG/ML (PF) SYRINGE
PREFILLED_SYRINGE | INTRAVENOUS | Status: DC | PRN
  Administered 2018-06-30: 50 mg via INTRAVENOUS

## 2018-06-30 MED ORDER — ONDANSETRON HCL 4 MG/2ML IJ SOLN
INTRAMUSCULAR | Status: AC
  Filled 2018-06-30: qty 6

## 2018-06-30 MED ORDER — ACETAMINOPHEN 325 MG PO TABS
325.0000 mg | ORAL_TABLET | Freq: Four times a day (QID) | ORAL | Status: DC | PRN
  Administered 2018-06-30: 325 mg via ORAL
  Administered 2018-07-01 – 2018-07-02 (×2): 650 mg via ORAL
  Administered 2018-07-03: 325 mg via ORAL
  Filled 2018-06-30 (×4): qty 2

## 2018-06-30 MED ORDER — OXYCODONE HCL 5 MG PO TABS
5.0000 mg | ORAL_TABLET | ORAL | Status: DC | PRN
  Administered 2018-06-30 – 2018-07-02 (×8): 10 mg via ORAL
  Administered 2018-07-02: 5 mg via ORAL
  Administered 2018-07-03 – 2018-07-06 (×11): 10 mg via ORAL
  Filled 2018-06-30 (×19): qty 2

## 2018-06-30 MED ORDER — CLONIDINE HCL (ANALGESIA) 100 MCG/ML EP SOLN
EPIDURAL | Status: AC
  Filled 2018-06-30: qty 10

## 2018-06-30 MED ORDER — TRANEXAMIC ACID-NACL 1000-0.7 MG/100ML-% IV SOLN
INTRAVENOUS | Status: AC
  Filled 2018-06-30: qty 100

## 2018-06-30 MED ORDER — CLONIDINE HCL (ANALGESIA) 100 MCG/ML EP SOLN
EPIDURAL | Status: DC | PRN
  Administered 2018-06-30: 1 mL

## 2018-06-30 MED ORDER — TRANEXAMIC ACID 1000 MG/10ML IV SOLN
2000.0000 mg | INTRAVENOUS | Status: AC
  Administered 2018-06-30 (×2): 2000 mg via TOPICAL
  Filled 2018-06-30: qty 20

## 2018-06-30 MED ORDER — SUGAMMADEX SODIUM 200 MG/2ML IV SOLN
INTRAVENOUS | Status: DC | PRN
  Administered 2018-06-30: 200 mg via INTRAVENOUS

## 2018-06-30 MED ORDER — MORPHINE SULFATE (PF) 4 MG/ML IV SOLN
INTRAVENOUS | Status: DC | PRN
  Administered 2018-06-30: 8 mg via INTRAVENOUS

## 2018-06-30 MED ORDER — MENTHOL 3 MG MT LOZG: 1.0000 | LOZENGE | OROMUCOSAL | Status: DC | PRN

## 2018-06-30 MED ORDER — METHOCARBAMOL 1000 MG/10ML IJ SOLN
500.0000 mg | Freq: Four times a day (QID) | INTRAVENOUS | Status: DC | PRN
  Filled 2018-06-30: qty 5

## 2018-06-30 MED ORDER — BUPIVACAINE HCL 0.25 % IJ SOLN
INTRAMUSCULAR | Status: DC | PRN
  Administered 2018-06-30: 20 mL

## 2018-06-30 MED ORDER — ONDANSETRON HCL 4 MG/2ML IJ SOLN: 4.0000 mg | Freq: Once | INTRAMUSCULAR | Status: DC | PRN

## 2018-06-30 MED ORDER — METOCLOPRAMIDE HCL 5 MG PO TABS: 5.0000 mg | ORAL_TABLET | Freq: Three times a day (TID) | ORAL | Status: DC | PRN

## 2018-06-30 MED ORDER — DEXAMETHASONE SODIUM PHOSPHATE 10 MG/ML IJ SOLN
INTRAMUSCULAR | Status: AC
  Filled 2018-06-30: qty 1

## 2018-06-30 MED ORDER — MORPHINE SULFATE (PF) 4 MG/ML IV SOLN
INTRAVENOUS | Status: AC
  Filled 2018-06-30: qty 2

## 2018-06-30 MED ORDER — VANCOMYCIN HCL IN DEXTROSE 1-5 GM/200ML-% IV SOLN
1000.0000 mg | Freq: Two times a day (BID) | INTRAVENOUS | Status: AC
  Administered 2018-06-30: 1000 mg via INTRAVENOUS
  Filled 2018-06-30: qty 200

## 2018-06-30 MED ORDER — PROPOFOL 10 MG/ML IV BOLUS
INTRAVENOUS | Status: DC | PRN
  Administered 2018-06-30: 200 mg via INTRAVENOUS

## 2018-06-30 MED ORDER — LACTATED RINGERS IV SOLN
INTRAVENOUS | Status: AC
  Administered 2018-06-30 – 2018-07-01 (×2): via INTRAVENOUS

## 2018-06-30 MED ORDER — ONDANSETRON HCL 4 MG/2ML IJ SOLN: 4.0000 mg | Freq: Four times a day (QID) | INTRAMUSCULAR | Status: DC | PRN

## 2018-06-30 MED ORDER — SODIUM CHLORIDE 0.9% FLUSH
INTRAVENOUS | Status: DC | PRN
  Administered 2018-06-30: 20 mL

## 2018-06-30 SURGICAL SUPPLY — 79 items
BAG DECANTER FOR FLEXI CONT (MISCELLANEOUS) ×3 IMPLANT
BANDAGE ESMARK 6X9 LF (GAUZE/BANDAGES/DRESSINGS) ×1 IMPLANT
BASEPLATE TIBIAL PRIMARY SZ6 (Orthopedic Implant) ×2 IMPLANT
BLADE SAG 18X100X1.27 (BLADE) ×3 IMPLANT
BLADE SAW SGTL 13.0X1.19X90.0M (BLADE) IMPLANT
BNDG CMPR 9X6 STRL LF SNTH (GAUZE/BANDAGES/DRESSINGS) ×1
BNDG CMPR MED 10X6 ELC LF (GAUZE/BANDAGES/DRESSINGS) ×1
BNDG CMPR MED 15X6 ELC VLCR LF (GAUZE/BANDAGES/DRESSINGS) ×1
BNDG COHESIVE 6X5 TAN STRL LF (GAUZE/BANDAGES/DRESSINGS) ×3 IMPLANT
BNDG ELASTIC 6X10 VLCR STRL LF (GAUZE/BANDAGES/DRESSINGS) ×2 IMPLANT
BNDG ELASTIC 6X15 VLCR STRL LF (GAUZE/BANDAGES/DRESSINGS) ×3 IMPLANT
BNDG ESMARK 6X9 LF (GAUZE/BANDAGES/DRESSINGS) ×3
BOWL SMART MIX CTS (DISPOSABLE) ×2 IMPLANT
BSPLAT TIB 6 CMNT PRM STRL KN (Orthopedic Implant) ×1 IMPLANT
CEMENT BONE SIMPLEX SPEEDSET (Cement) ×4 IMPLANT
CLOSURE WOUND 1/2 X4 (GAUZE/BANDAGES/DRESSINGS) ×1
COMPONENT TRI CR FEM SZ7 LT KN (Orthopedic Implant) IMPLANT
CONT SPECI 4OZ STER CLIK (MISCELLANEOUS) ×3 IMPLANT
COVER SURGICAL LIGHT HANDLE (MISCELLANEOUS) ×3 IMPLANT
COVER WAND RF STERILE (DRAPES) ×3 IMPLANT
CUFF TOURNIQUET SINGLE 34IN LL (TOURNIQUET CUFF) ×3 IMPLANT
CUFF TOURNIQUET SINGLE 44IN (TOURNIQUET CUFF) IMPLANT
DECANTER SPIKE VIAL GLASS SM (MISCELLANEOUS) ×3 IMPLANT
DRAPE INCISE IOBAN 66X45 STRL (DRAPES) ×2 IMPLANT
DRAPE ORTHO SPLIT 77X108 STRL (DRAPES) ×9
DRAPE SURG ORHT 6 SPLT 77X108 (DRAPES) ×3 IMPLANT
DRAPE U-SHAPE 47X51 STRL (DRAPES) ×3 IMPLANT
DRSG AQUACEL AG ADV 3.5X14 (GAUZE/BANDAGES/DRESSINGS) ×4 IMPLANT
DURAPREP 26ML APPLICATOR (WOUND CARE) ×6 IMPLANT
ELECT CAUTERY BLADE 6.4 (BLADE) ×3 IMPLANT
ELECT REM PT RETURN 9FT ADLT (ELECTROSURGICAL) ×3
ELECTRODE REM PT RTRN 9FT ADLT (ELECTROSURGICAL) ×1 IMPLANT
GAUZE SPONGE 4X4 12PLY STRL (GAUZE/BANDAGES/DRESSINGS) ×3 IMPLANT
GLOVE BIOGEL PI IND STRL 7.5 (GLOVE) ×1 IMPLANT
GLOVE BIOGEL PI IND STRL 8 (GLOVE) ×1 IMPLANT
GLOVE BIOGEL PI INDICATOR 7.5 (GLOVE) ×2
GLOVE BIOGEL PI INDICATOR 8 (GLOVE) ×2
GLOVE ECLIPSE 7.0 STRL STRAW (GLOVE) ×3 IMPLANT
GLOVE SURG ORTHO 8.0 STRL STRW (GLOVE) ×3 IMPLANT
GOWN STRL REUS W/ TWL LRG LVL3 (GOWN DISPOSABLE) ×3 IMPLANT
GOWN STRL REUS W/TWL LRG LVL3 (GOWN DISPOSABLE) ×9
HANDPIECE INTERPULSE COAX TIP (DISPOSABLE) ×3
HOOD PEEL AWAY FLYTE STAYCOOL (MISCELLANEOUS) ×9 IMPLANT
IMMOBILIZER KNEE 20 (SOFTGOODS) ×2 IMPLANT
IMMOBILIZER KNEE 22 UNIV (SOFTGOODS) IMPLANT
IMMOBILIZER KNEE 24 THIGH 36 (MISCELLANEOUS) IMPLANT
IMMOBILIZER KNEE 24 UNIV (MISCELLANEOUS)
INSERT KNEE TIB BRG 6 (Insert) ×2 IMPLANT
KIT BASIN OR (CUSTOM PROCEDURE TRAY) ×3 IMPLANT
KIT TURNOVER KIT B (KITS) ×3 IMPLANT
MANIFOLD NEPTUNE II (INSTRUMENTS) ×3 IMPLANT
NDL SAFETY ECLIPSE 18X1.5 (NEEDLE) ×1 IMPLANT
NEEDLE 22X1 1/2 (OR ONLY) (NEEDLE) ×6 IMPLANT
NEEDLE HYPO 18GX1.5 SHARP (NEEDLE) ×3
NS IRRIG 1000ML POUR BTL (IV SOLUTION) ×6 IMPLANT
PACK TOTAL JOINT (CUSTOM PROCEDURE TRAY) ×3 IMPLANT
PAD ARMBOARD 7.5X6 YLW CONV (MISCELLANEOUS) ×6 IMPLANT
PAD CAST 4YDX4 CTTN HI CHSV (CAST SUPPLIES) ×1 IMPLANT
PADDING CAST COTTON 4X4 STRL (CAST SUPPLIES) ×3
PADDING CAST COTTON 6X4 STRL (CAST SUPPLIES) ×3 IMPLANT
PATELLA 32MMX10MM (Knees) ×2 IMPLANT
SET HNDPC FAN SPRY TIP SCT (DISPOSABLE) ×1 IMPLANT
STRIP CLOSURE SKIN 1/2X4 (GAUZE/BANDAGES/DRESSINGS) ×3 IMPLANT
SUCTION FRAZIER HANDLE 10FR (MISCELLANEOUS) ×2
SUCTION TUBE FRAZIER 10FR DISP (MISCELLANEOUS) ×1 IMPLANT
SUT MNCRL AB 3-0 PS2 18 (SUTURE) ×3 IMPLANT
SUT VIC AB 0 CT1 27 (SUTURE) ×9
SUT VIC AB 0 CT1 27XBRD ANBCTR (SUTURE) ×3 IMPLANT
SUT VIC AB 1 CT1 27 (SUTURE) ×15
SUT VIC AB 1 CT1 27XBRD ANBCTR (SUTURE) ×5 IMPLANT
SUT VIC AB 2-0 CT1 27 (SUTURE) ×12
SUT VIC AB 2-0 CT1 TAPERPNT 27 (SUTURE) ×4 IMPLANT
SYR 30ML LL (SYRINGE) ×9 IMPLANT
SYR TB 1ML LUER SLIP (SYRINGE) ×3 IMPLANT
TOWEL OR 17X24 6PK STRL BLUE (TOWEL DISPOSABLE) ×6 IMPLANT
TOWEL OR 17X26 10 PK STRL BLUE (TOWEL DISPOSABLE) ×6 IMPLANT
TRAY CATH 16FR W/PLASTIC CATH (SET/KITS/TRAYS/PACK) IMPLANT
TRI CRU RET FEM SZ7 LT CR KNEE (Orthopedic Implant) ×3 IMPLANT
WATER STERILE IRR 1000ML POUR (IV SOLUTION) ×8 IMPLANT

## 2018-06-30 NOTE — Anesthesia Postprocedure Evaluation (Signed)
Anesthesia Post Note  Patient: [name]  Procedure(s) Performed: LEFT TOTAL KNEE ARTHROPLASTY (Left Knee)     Patient location during evaluation: PACU Anesthesia Type: General Level of consciousness: awake and alert Pain management: pain level controlled Vital Signs Assessment: post-procedure vital signs reviewed and stable Respiratory status: spontaneous breathing, nonlabored ventilation and respiratory function stable Cardiovascular status: blood pressure returned to baseline and stable Postop Assessment: no apparent nausea or vomiting Anesthetic complications: no    Last Vitals:  Vitals:   06/30/18 1435 06/30/18 1450  BP: (!) 153/61 (!) 149/55  Pulse: (!) 52 61  Resp: 14 (!) 9  Temp:    SpO2: 100% 100%    Last Pain:  Vitals:   06/30/18 0923  PainSc: 3     LLE Motor Response: Purposeful movement (06/30/18 1505) LLE Sensation: Full sensation;No numbness;No tingling (06/30/18 1505)          Lidia Collum

## 2018-06-30 NOTE — H&P (Addendum)
TOTAL KNEE ADMISSION H&P  Patient is being admitted for left total knee arthroplasty.  Subjective:  Chief Complaint:left knee pain.  HPI: [name], 63 y.o. female, has a history of pain and functional disability in the left knee due to arthritis and has failed non-surgical conservative treatments for greater than 12 weeks to includeNSAID's and/or analgesics, corticosteriod injections, use of assistive devices and activity modification.  Onset of symptoms was gradual, starting 8 years ago with gradually worsening course since that time. The patient noted no past surgery on the left knee(s).  Patient currently rates pain in the left knee(s) at 9 out of 10 with activity. Patient has night pain, worsening of pain with activity and weight bearing, pain that interferes with activities of daily living, pain with passive range of motion, crepitus and joint swelling.  Patient has evidence of subchondral sclerosis and joint space narrowing by imaging studies. This patient has had History of cardiac issues for which she takes Eliquis.  This was held per cardiac recommendation 3 days prior to surgery.  Her cardiac risk profile and re-stratification was low risk.. There is no active infection.  Patient has BMI of 35 which is increased and does increase the complexity and difficulty of the surgery.  Puts her in a higher risk category.  Patient Active Problem List   Diagnosis Date Noted  . Mild cognitive impairment 04/30/2018  . Venous stasis dermatitis of right lower extremity 01/02/2018  . Genetic testing 11/24/2017  . Family history of colon cancer   . B12 deficiency 12/13/2016  . Malabsorption 03/12/2016  . Tubular adenoma of colon 03/09/2016  . Vitamin D deficiency 03/09/2016  . Rectovaginal fistula 05/25/2014  . S/P bariatric surgery-duodenal switch with sleeve gastrectomy 12/04/2013  . Nonobstructive CAD  08/26/2013  . Iron deficiency anemia 06/19/2012  . Low back pain 03/05/2012  .  Osteoarthritis of knee 04/23/2011  . UNSPECIFIED VENOUS INSUFFICIENCY 05/28/2010  . PSORIASIS 05/28/2010  . HYPERCHOLESTEROLEMIA 03/20/2010  . Essential hypertension 03/20/2010  . Chronic dermatitis of hands 05/02/2009  . Peripheral neuropathy 01/12/2009  . Type 2 diabetes, controlled, with neuropathy ([place]) 02/18/2008  . Chronic depression 10/06/2007  . OBSTRUCTIVE SLEEP APNEA 06/12/2007  . GOITER, MULTINODULAR 05/13/2007   Past Medical History:  Diagnosis Date  . Allergy   . ANEMIA, PERNICIOUS, HX OF 05/13/2007  . Anxiety   . Arthritis   . ASYMPTOMATIC POSTMENOPAUSAL STATUS 02/18/2008  . BACK PAIN, LUMBAR 11/19/2007  . Bowel incontinence   . Cataract   . Cellulitis of left lower leg 10/28/2013  . Cellulitis of leg, right 10/11/2010  . DEPRESSION, CHRONIC 10/06/2007  . Diabetes mellitus without complication ([place])   . DIABETES MELLITUS, WITH NEUROLOGICAL COMPLICATIONS 5/00/3704  . Diabetic neuropathy ([place])   . DYSLIPIDEMIA 05/13/2007  . Family history of colon cancer   . Fecal soiling 06/28/2014  . Frozen shoulder    Lt  . Full dentures   . GERD (gastroesophageal reflux disease)   . GOITER, MULTINODULAR 05/13/2007  . Headache   . Hx of colonic polyps 12/04/2010  . HYPERCHOLESTEROLEMIA 03/20/2010  . HYPERTENSION 03/20/2010  . Lower back pain   . NASH (nonalcoholic steatohepatitis)   . OA (osteoarthritis)   . OBSTRUCTIVE SLEEP APNEA 06/12/2007   uses CPAP.  Marland Kitchen PERIPHERAL NEUROPATHY 01/12/2009  . Peripheral vascular disease ([place])   . Pneumonia   . PSORIASIS 05/28/2010  . Renal insufficiency    stage 1 kd  . Sleep apnea   . UNSPECIFIED VENOUS INSUFFICIENCY 05/28/2010  .  Wears glasses     Past Surgical History:  Procedure Laterality Date  . CATARACT EXTRACTION W/ INTRAOCULAR LENS  IMPLANT, BILATERAL    . COLONOSCOPY    . ELECTROCARDIOGRAM  04/16/2006  . EXAMINATION UNDER ANESTHESIA N/A 06/29/2014   Procedure: EXAM UNDER ANESTHESIA;  Surgeon: Janyth Contes, MD;  Location:  [facility];  Service: Gynecology;  Laterality: N/A;  . Exercise myoview  01/24/2005  . FLEXIBLE SIGMOIDOSCOPY    . GASTRIC BYPASS  11/09/2013  . GASTRIC BYPASS    . KNEE ARTHROSCOPY  2003   right  . LAPAROSCOPY N/A 03/12/2016   Procedure: LAPAROSCOPIC ANASTOMOSIS OF INTESTINE (ENTEROENTEROSTOMY);  Surgeon: Ladora Daniel, MD;  Location: [facility];  Service: General;  Laterality: N/A;  . LEG SURGERY     metal and pins in lower left leg  . mrsa Right    arm  . MULTIPLE TOOTH EXTRACTIONS    . ROTATOR CUFF REPAIR  2008   right  . SHOULDER ARTHROSCOPY W/ ROTATOR CUFF REPAIR Right   . TONSILLECTOMY    . VARICOSE VEIN SURGERY     Remotef    Current Facility-Administered Medications  Medication Dose Route Frequency Provider Last Rate Last Dose  . bupivacaine liposome (EXPAREL) 1.3 % injection 266 mg  20 mL Infiltration To OR Meredith Pel, MD      . chlorhexidine (HIBICLENS) 4 % liquid 4 application  60 mL Topical Once Meredith Pel, MD      . chlorhexidine (HIBICLENS) 4 % liquid 4 application  60 mL Topical Once Meredith Pel, MD      . fentaNYL (SUBLIMAZE) 100 MCG/2ML injection           . lactated ringers infusion   Intravenous Continuous Lidia Collum, MD 50 mL/hr at 06/30/18 0959    . midazolam (VERSED) 2 MG/2ML injection           . tranexamic acid (CYKLOKAPRON) 2,000 mg in sodium chloride 0.9 % 50 mL Topical Application  1,610 mg Topical To OR Meredith Pel, MD      . vancomycin (VANCOCIN) 1,500 mg in sodium chloride 0.9 % 500 mL IVPB  1,500 mg Intravenous To SS-Surg Marlou Sa Tonna Corner, MD       Allergies  Allergen Reactions  . Cephalexin Hives and Rash    Denies Airway involvement  . Latex Rash  . Neomycin-Bacitracin Zn-Polymyx Rash  . Tape Rash    Latex Prefers paper tape    Social History   Tobacco Use  . Smoking status: Former Smoker    Packs/day: 2.00    Years: 24.00    Pack years: 48.00    Types: Cigarettes    Start date: 06/24/1970    Last  attempt to quit: 08/06/1994    Years since quitting: 23.9  . Smokeless tobacco: Never Used  Substance Use Topics  . Alcohol use: No    Alcohol/week: 0.0 standard drinks    Family History  Problem Relation Age of Onset  . Hyperlipidemia Father   . Hypertension Father   . Cirrhosis Father   . Colon cancer Father 14       d. 72  . Colon cancer Sister 47       d. 30  . Bipolar disorder Sister   . Aneurysm Mother        brain  . Cerebral aneurysm Mother   . Colon cancer Sister 40       d. 38  . Bipolar disorder Sister   .  Cancer Paternal Aunt        NOS, ? colon  . Congestive Heart Failure Maternal Grandmother   . Drug abuse Neg Hx   . CAD Neg Hx   . Stomach cancer Neg Hx      Review of Systems  Musculoskeletal: Positive for joint pain.  All other systems reviewed and are negative.   Objective:  Physical Exam  Constitutional: She appears well-developed.  HENT:  Head: Normocephalic.  Eyes: Pupils are equal, round, and reactive to light.  Neck: Normal range of motion.  Cardiovascular: Normal rate.  Respiratory: Effort normal.  Neurological: She is alert.  Skin: Skin is warm.  Psychiatric: She has a normal mood and affect.  Left knee exam demonstrates valgus alignment.  Patient does have small flexion contracture and is able to bend to just past 90 degrees.  Extensor mechanism is intact.  Ankle dorsiflexion intact.  Pedal pulses palpable.  Vital signs in last 24 hours: Temp:  [97.7 F (36.5 C)] 97.7 F (36.5 C) (01/07 0913) Pulse Rate:  [52] 52 (01/07 0912) Resp:  [18] 18 (01/07 0912) BP: (163)/(54) (P) 153/46 (01/07 1001) SpO2:  [98 %] 98 % (01/07 0912) Weight:  [916 kg] 103 kg (01/07 0947)  Labs:   Estimated body mass index is 35.03 kg/m as calculated from the following:   Height as of this encounter: 5' 7.5" (1.715 m).   Weight as of this encounter: 103 kg.   Imaging Review Plain radiographs demonstrate severe degenerative joint disease of the left  knee(s). The overall alignment ismild valgus. The bone quality appears to be fair for age and reported activity level.   Preoperative templating of the joint replacement has been completed, documented, and submitted to the Operating Room personnel in order to optimize intra-operative equipment management.   Anticipated LOS equal to or greater than 2 midnights due to - Age 54 and older with one or more of the following:  - Obesity  - Expected need for hospital services (PT, OT, Nursing) required for safe  discharge  - Anticipated need for postoperative skilled nursing care or inpatient rehab  - Active co-morbidities: Coronary Artery Disease OR   - Unanticipated findings during/Post Surgery: None  - Patient is a high risk of re-admission due to: None     Assessment/Plan:  End stage arthritis, left knee   The patient history, physical examination, clinical judgment of the provider and imaging studies are consistent with end stage degenerative joint disease of the left knee(s) and total knee arthroplasty is deemed medically necessary. The treatment options including medical management, injection therapy arthroscopy and arthroplasty were discussed at length. The risks and benefits of total knee arthroplasty were presented and reviewed. The risks due to aseptic loosening, infection, stiffness, patella tracking problems, thromboembolic complications and other imponderables were discussed. The patient acknowledged the explanation, agreed to proceed with the plan and consent was signed. Patient is being admitted for inpatient treatment for surgery, pain control, PT, OT, prophylactic antibiotics, VTE prophylaxis, progressive ambulation and ADL's and discharge planning. The patient is planning to be discharged home with home health services Plan to restart Eliquis on the evening of surgery.  We will likely not use additional DVT prophylaxis besides the Eliquis.  We do plan to use topical trans-Amick  acid intraoperatively.

## 2018-06-30 NOTE — Progress Notes (Signed)
1800 Received pt from PACU, A&O x4. LLE with ace wrap dry and intact, left knee immobilizer on. Assisted pt to bedside commode as soon as she got to the room. Voiding freely. Pt c/o left knee pain, medicated.

## 2018-06-30 NOTE — Op Note (Signed)
NAME: [name], SCHWIMMER MEDICAL RECORD TZ:[id number] ACCOUNT [id number] DATE OF BIRTH:1956-04-12 FACILITY: [facility] LOCATION: [facility] PHYSICIAN:GREGORY Randel Pigg, MD  OPERATIVE REPORT  DATE OF PROCEDURE:  06/30/2018  PREOPERATIVE DIAGNOSIS:  Left knee arthritis.  POSTOPERATIVE DIAGNOSIS:  Left knee arthritis.  PROCEDURE:  Left total knee replacement using Stryker Triathlon cruciate retaining size 7 femur, 6 tibia, 9 mm polyethylene deep dish insert with 32 mm 3-peg patella.  All components cemented.  SURGEON:  Meredith Pel, MD.  ASSISTANT:  Laure Kidney, RNFA  INDICATIONS:  The patient is a 63 year old patient with end-stage left knee arthritis who presents for operative management after explanation of risks and benefits.  She has failed conservative measures.  PROCEDURE IN DETAIL:  The patient was brought to the operating room where general anesthetic was induced.  Preoperative antibiotics administered.  Timeout was called.  Left leg prescrubbed with alcohol and Betadine, allowed to air dry, prepped and  DuraPrep solution.  Draped in a sterile manner.  Ioban used to cover the operative field.  Leg elevated, exsanguinated with the Esmarch wrap.  Tourniquet was inflated.  Median anterior approach to the knee was made.  Skin and subcutaneous tissue were  sharply divided.  Median parapatellar approach was made and marked with a #1 Vicryl suture.  The patient had preoperative valgus deformity with tricompartmental severe osteoarthritis.  Fat pad was partially excised.  The patient had contracture of the  lateral retinaculum and the lateral structures.  Soft tissue removed from the anterior distal femur.  No medial-sided soft tissue dissection was performed.  Only the proximal centimeter to centimeter and a half of the tibia was exposed in order to allow  for retractor placement.  At this time, the knee was flexed.  The cut was made taking 2 mm off the most affected lateral tibial plateau.   Collaterals and posterior neurovascular structures were protected.  This was made perpendicular to the mechanical  axis of the knee.  Soft tissue releases were performed along the lateral tibial plateau in order to facilitate soft tissue balancing.  At this time, the femur was cut taking 8 mm off the distal femur.  A 9 mm spacer was able to fit in extension.  A  cutting jig was then placed and the knee was cut to a size 7.  No notching was encountered.  After making the chamfer, anterior and posterior cuts, soft tissue tension was again checked with a 9 mm spacer block in both flexion and extension and the gaps  were symmetric.  The tibia was keel punched at this time.  Patella was then cut from 24 to 14 mm.  A 3-peg patellar trial was placed.  Then, with the components in position, the patient had good alignment and good tracking.  Lateral release was required  due to contracture of the lateral right-sided tissues.  This allowed the patella to track using no thumbs technique.  At this time, the trial components were removed.  The patient had good stability to varus and valgus stress at 0, 30 and 90 degrees.   Thorough irrigation with 3 liters of irrigating solution was performed.  The patient was then anesthetized using Exparel and Marcaine.  Tranexamic acid sponge was then placed into the knee for 3 minutes.  This was removed and then the components were  cemented into position with excess cement removed.  Same stability parameters were maintained.  Tourniquet was released at this time and bleeding points encountered and were controlled using electrocautery.  The extensor mechanism was then closed over a  bolster using #1 Vicryl suture followed by interrupted inverted 0 Vicryl suture, 2-0 Vicryl suture and a 3-0 Monocryl.  The patient tolerated the procedure well without immediate complications and was transferred to the recovery room in stable condition.   It should be noted that the skin edges were  anesthetized using Marcaine with plain epinephrine.  This also contained morphine and clonidine.  A knee immobilizer placed.  TN/NUANCE  D:06/30/2018 T:06/30/2018 JOB:[id number]

## 2018-06-30 NOTE — Anesthesia Procedure Notes (Signed)
Procedure Name: Intubation Date/Time: 06/30/2018 11:19 AM Performed by: Myna Bright, CRNA Pre-anesthesia Checklist: Patient identified, Emergency Drugs available, Suction available and Patient being monitored Patient Re-evaluated:Patient Re-evaluated prior to induction Oxygen Delivery Method: Circle system utilized Preoxygenation: Pre-oxygenation with 100% oxygen Induction Type: IV induction Ventilation: Mask ventilation without difficulty and Oral airway inserted - appropriate to patient size Laryngoscope Size: Mac and 3 Grade View: Grade I Tube type: Oral Tube size: 7.0 mm Number of attempts: 1 Airway Equipment and Method: Stylet Placement Confirmation: ETT inserted through vocal cords under direct vision,  positive ETCO2 and breath sounds checked- equal and bilateral Secured at: 21 cm Tube secured with: Tape Dental Injury: Teeth and Oropharynx as per pre-operative assessment

## 2018-06-30 NOTE — Progress Notes (Signed)
Orthopedic Tech Progress Note Patient Details:  [name] 04/01/1956 [id number]  Patient ID: [name], female   DOB: 1956/05/13, 63 y.o.   MRN: [id number]   [name] 06/30/2018, 3:45 PMOrtho Department out of Continuous passive motion machine.

## 2018-06-30 NOTE — Brief Op Note (Signed)
06/30/2018  2:03 PM  PATIENT:  Leda Gauze  63 y.o. female  PRE-OPERATIVE DIAGNOSIS:  left knee osteoarthritis  POST-OPERATIVE DIAGNOSIS:  left knee osteoarthritis  PROCEDURE:  Procedure(s): LEFT TOTAL KNEE ARTHROPLASTY  SURGEON:  Surgeon(s): Marlou Sa, Tonna Corner, MD  ASSISTANT: Laure Kidney rnfa  ANESTHESIA:   general  EBL: 50 ml    Total I/O In: 800 [I.V.:800] Out: 50 [Blood:50]  BLOOD ADMINISTERED: none  DRAINS: none   LOCAL MEDICATIONS USED: Marcaine morphine clonidine Exparel  SPECIMEN:  No Specimen  COUNTS:  YES  TOURNIQUET:   Total Tourniquet Time Documented: Thigh (Left) - 84 minutes Total: Thigh (Left) - 84 minutes   DICTATION: .Other Dictation: Dictation Number [id number]  PLAN OF CARE: Admit to inpatient   PATIENT DISPOSITION:  PACU - hemodynamically stable

## 2018-06-30 NOTE — Transfer of Care (Signed)
Immediate Anesthesia Transfer of Care Note  Patient: [name]  Procedure(s) Performed: LEFT TOTAL KNEE ARTHROPLASTY (Left Knee)  Patient Location: PACU  Anesthesia Type:GA combined with regional for post-op pain  Level of Consciousness: awake, alert , oriented and patient cooperative  Airway & Oxygen Therapy: Patient Spontanous Breathing and Patient connected to nasal cannula oxygen  Post-op Assessment: Report given to RN, Post -op Vital signs reviewed and stable and Patient moving all extremities  Post vital signs: Reviewed and stable  Last Vitals:  Vitals Value Taken Time  BP 149/66 06/30/2018  2:04 PM  Temp    Pulse 60 06/30/2018  2:08 PM  Resp 13 06/30/2018  2:08 PM  SpO2 99 % 06/30/2018  2:08 PM  Vitals shown include unvalidated device data.  Last Pain:  Vitals:   06/30/18 0923  PainSc: 3          Complications: No apparent anesthesia complications

## 2018-06-30 NOTE — Progress Notes (Signed)
Orthopedic Tech Progress Note Patient Details:  [name] 1955/11/24 [id number]  Ortho Devices Ortho Device/Splint Location: Trapeze bar, foot roll Ortho Device/Splint Interventions: Application   Post Interventions Patient Tolerated: Well Instructions Provided: Care of device, Adjustment of device   [name] 06/30/2018, 3:44 PM

## 2018-06-30 NOTE — Anesthesia Procedure Notes (Addendum)
Anesthesia Regional Block: Adductor canal block   Pre-Anesthetic Checklist: ,, timeout performed, Correct Patient, Correct Site, Correct Laterality, Correct Procedure, Correct Position, site marked, Risks and benefits discussed,  Surgical consent,  Pre-op evaluation,  At surgeon's request and post-op pain management  Laterality: Left  Prep: chloraprep       Needles:  Injection technique: Single-shot  Needle Type: Echogenic Stimulator Needle     Needle Length: 9cm  Needle Gauge: 21     Additional Needles:   Procedures:,,,, ultrasound used (permanent image in chart),,,,  Narrative:  Start time: 06/30/2018 10:40 AM End time: 06/30/2018 10:46 AM Injection made incrementally with aspirations every 5 mL.  Performed by: Personally  Anesthesiologist: Lidia Collum, MD  Additional Notes: Monitors applied. Injection made in 5cc increments. No resistance to injection. Good needle visualization. Patient tolerated procedure well.

## 2018-07-01 ENCOUNTER — Encounter (HOSPITAL_COMMUNITY): Payer: Self-pay | Admitting: Orthopedic Surgery

## 2018-07-01 LAB — GLUCOSE, CAPILLARY: Glucose-Capillary: 140 mg/dL — ABNORMAL HIGH (ref 70–99)

## 2018-07-01 NOTE — Discharge Instructions (Signed)

## 2018-07-01 NOTE — Progress Notes (Signed)
Physical Therapy Treatment Patient Details Name: [name] MRN: [id number] DOB: Sep 26, 1955 Today's Date: 07/01/2018    History of Present Illness 63yo female who failed conservative management of L knee pain and had L TKA on 06/30/18. PMH mild cognitive impairment, hx bariatric surgery, LBP, HTN, peripheral neuropathy, DM, bowel incontinence, L frozen shoulder, R RCR, L LE surgery, R knee surgery     PT Comments    Patient received in bed, pleasant and willing to participate in PT today but fatigued, SPO2 95% on room air. Verbally reviewed and practiced supine TKR exercises with modA due to pain and general LE weakness, then able to perform bed mobility with MinA to support L LE. Performed sit to stand x2 with RW and ModA, mod cues for sequencing each attempt, also worked on small steps forward/backward in front of the bed and lateral weight shifting to assist in preparing for progression of gait, but limited by pain. She required MinA to return to supine, SpO2 after activity found to be 82% on room air but recovered back to 93% with cued pursed lip breathing on room air. She was left in bed with alarm active and all needs otherwise met this afternoon.     Follow Up Recommendations  Follow surgeon's recommendation for DC plan and follow-up therapies;Other (comment)(SNF )     Equipment Recommendations  Other (comment)(defer to next venue )    Recommendations for Other Services       Precautions / Restrictions Precautions Precautions: Fall, watch SPO2  Required Braces or Orthoses: Knee Immobilizer - Left Knee Immobilizer - Left: On except when in CPM;On when out of bed or walking Restrictions Weight Bearing Restrictions: Yes LLE Weight Bearing: Weight bearing as tolerated    Mobility  Bed Mobility Overal bed mobility: Needs Assistance Bed Mobility: Supine to Sit;Sit to Supine     Supine to sit: Min assist Sit to supine: Min assist   General bed mobility comments: MinA to  support L LE   Transfers Overall transfer level: Needs assistance Equipment used: Rolling walker (2 wheeled) Transfers: Sit to/from Stand Sit to Stand: Mod assist         General transfer comment: ModA and Mod cues for sequencing and safety, RW   Ambulation/Gait             General Gait Details: pre-gait in front of bed including small steps forward and backward, lateral weight shifts    Stairs             Wheelchair Mobility    Modified Rankin (Stroke Patients Only)       Balance Overall balance assessment: Needs assistance Sitting-balance support: Bilateral upper extremity supported;Feet supported Sitting balance-Leahy Scale: Good     Standing balance support: Bilateral upper extremity supported;During functional activity Standing balance-Leahy Scale: Fair Standing balance comment: heavy reliance on B UE support                             Cognition Arousal/Alertness: Awake/alert Behavior During Therapy: WFL for tasks assessed/performed Overall Cognitive Status: Within Functional Limits for tasks assessed                                        Exercises      General Comments        Pertinent Vitals/Pain Pain Assessment: No/denies pain Pain Score:  4  Pain Location: L knee  Pain Descriptors / Indicators: Aching;Sore Pain Intervention(s): Limited activity within patient's tolerance;Repositioned;Monitored during session    Home Living                      Prior Function            PT Goals (current goals can now be found in the care plan section) Acute Rehab PT Goals Patient Stated Goal: go to rehab before going home  PT Goal Formulation: With patient Time For Goal Achievement: 07/15/18 Potential to Achieve Goals: Good Progress towards PT goals: Progressing toward goals    Frequency    7X/week      PT Plan Current plan remains appropriate    Co-evaluation              AM-PAC PT "6  Clicks" Mobility   Outcome Measure  Help needed turning from your back to your side while in a flat bed without using bedrails?: A Little Help needed moving from lying on your back to sitting on the side of a flat bed without using bedrails?: A Little Help needed moving to and from a bed to a chair (including a wheelchair)?: A Lot Help needed standing up from a chair using your arms (e.g., wheelchair or bedside chair)?: A Lot Help needed to walk in hospital room?: A Lot Help needed climbing 3-5 steps with a railing? : Total 6 Click Score: 13    End of Session Equipment Utilized During Treatment: Gait belt Activity Tolerance: Patient limited by pain;Patient limited by fatigue Patient left: in bed;with bed alarm set;with call bell/phone within reach   PT Visit Diagnosis: Unsteadiness on feet (R26.81);Muscle weakness (generalized) (M62.81);Difficulty in walking, not elsewhere classified (R26.2);Other abnormalities of gait and mobility (R26.89)     Time: 1420-1450 PT Time Calculation (min) (ACUTE ONLY): 30 min  Charges:  $Gait Training: 8-22 mins $Therapeutic Exercise: 8-22 mins                     Deniece Ree PT, DPT, CBIS  Supplemental Physical Therapist [place]    Pager [phone] Acute Rehab Office [phone]

## 2018-07-01 NOTE — Evaluation (Signed)
Physical Therapy Evaluation Patient Details Name: [name] MRN: [id number] DOB: [birth year] Today's Date: 07/01/2018   History of Present Illness  63yo female who failed conservative management of L knee pain and had L TKA on 06/30/18. PMH mild cognitive impairment, hx bariatric surgery, LBP, HTN, peripheral neuropathy, DM, bowel incontinence, L frozen shoulder, R RCR, L LE surgery, R knee surgery   Clinical Impression   Patient received in bed, pleasant and willing to participate in PT session; knee AROM in supine 18 degrees extension, 32 degrees flexion limited by pain. Able to complete bed mobility with MinA to support L LE, and multiple stand pivots first to bedside commode with RW and MinA as well as extended time, and from bedside commode to recliner again with MinA, RW, and extended time. Patient very anxious and yelling out, crying during session due to fear of mobility secondary to pain. Did not attempt gait due to high levels of anxiety and pain with mobility. SpO2 94-95% on room air during session. She was left up in the chair with all needs met, RN aware of patient status. She will continue to benefit from skilled PT services in the acute setting, defer to surgeon's recommendation for DC/follow-up therapy, however would strongly recommend ST-SNF due to pain, difficulty with mobility and anxiety, and fall risk.     Follow Up Recommendations Follow surgeon's recommendation for DC plan and follow-up therapies;Other (comment)(SNF )    Equipment Recommendations  Other (comment)(defer to next venue )    Recommendations for Other Services       Precautions / Restrictions Precautions Precautions: Fall Required Braces or Orthoses: Knee Immobilizer - Left Knee Immobilizer - Left: On except when in CPM;On when out of bed or walking Restrictions Weight Bearing Restrictions: Yes LLE Weight Bearing: Weight bearing as tolerated      Mobility  Bed Mobility Overal bed mobility: Needs  Assistance Bed Mobility: Supine to Sit     Supine to sit: Min assist     General bed mobility comments: MinA to support L LE   Transfers Overall transfer level: Needs assistance Equipment used: Rolling walker (2 wheeled) Transfers: Sit to/from [facility] Sit to Stand: Min assist;From elevated surface Stand pivot transfers: Min assist       General transfer comment: MinA/extended time and Mod cues for safety/sequencing, extended time for stand-pivot to [facility] and recliner   Ambulation/Gait             General Gait Details: deferred due to knee pain   Stairs            Wheelchair Mobility    Modified Rankin (Stroke Patients Only)       Balance Overall balance assessment: Needs assistance Sitting-balance support: Bilateral upper extremity supported;Feet supported Sitting balance-Leahy Scale: Good     Standing balance support: Bilateral upper extremity supported;During functional activity Standing balance-Leahy Scale: Fair Standing balance comment: heavy reliance on B UE support                              Pertinent Vitals/Pain Pain Assessment: 0-10 Pain Score: 4  Pain Location: L knee  Pain Descriptors / Indicators: Aching;Sore Pain Intervention(s): Limited activity within patient's tolerance;Repositioned;Monitored during session    [place] expects to be discharged to:: Private residence Living Arrangements: Spouse/significant other;Non-relatives/Friends Available Help at Discharge: Available PRN/intermittently;Family Type of Home: House Home Access: Ramped entrance     Home Layout: One level  Home Equipment: Shower seat;Walker - 2 wheels;Grab bars - toilet Additional Comments: caregiver for husband who has paraplegia and WC bound. House with full access for [facility] and has hoyer. Daughter helps with transfer in/out of car.     Prior Function Level of Independence: Independent         Comments: no  device but frequent knee buckling      Hand Dominance        Extremity/Trunk Assessment   Upper Extremity Assessment Upper Extremity Assessment: Defer to OT evaluation    Lower Extremity Assessment Lower Extremity Assessment: Generalized weakness    Cervical / Trunk Assessment Cervical / Trunk Assessment: Kyphotic  Communication   Communication: No difficulties  Cognition Arousal/Alertness: Awake/alert Behavior During Therapy: WFL for tasks assessed/performed Overall Cognitive Status: Within Functional Limits for tasks assessed                                        General Comments      Exercises Total Joint Exercises Goniometric ROM: L knee extension 18 degrees (pain limited), flexion 32 degrees (pain limited)   Assessment/Plan    PT Assessment Patient needs continued PT services  PT Problem List Decreased strength;Decreased balance;Decreased knowledge of precautions;Pain;Decreased mobility;Decreased knowledge of use of DME;Obesity;Decreased activity tolerance;Decreased coordination;Decreased safety awareness       PT Treatment Interventions DME instruction;Functional mobility training;Balance training;Patient/family education;Gait training;Therapeutic activities;Neuromuscular re-education;Stair training;Therapeutic exercise;Cognitive remediation    PT Goals (Current goals can be found in the Care Plan section)  Acute Rehab PT Goals Patient Stated Goal: go to rehab before going home  PT Goal Formulation: With patient Time For Goal Achievement: 07/15/18 Potential to Achieve Goals: Good    Frequency 7X/week   Barriers to discharge        Co-evaluation               AM-PAC PT "6 Clicks" Mobility  Outcome Measure Help needed turning from your back to your side while in a flat bed without using bedrails?: A Little Help needed moving from lying on your back to sitting on the side of a flat bed without using bedrails?: A Little Help  needed moving to and from a bed to a chair (including a wheelchair)?: A Little Help needed standing up from a chair using your arms (e.g., wheelchair or bedside chair)?: A Little Help needed to walk in hospital room?: A Lot Help needed climbing 3-5 steps with a railing? : Total 6 Click Score: 15    End of Session Equipment Utilized During Treatment: Gait belt Activity Tolerance: Patient limited by pain Patient left: in chair;with call bell/phone within reach Nurse Communication: Mobility status;Other (comment)(O2 WNL on room air with activity, nursing should use +2 for transfers, need for SNF ) PT Visit Diagnosis: Unsteadiness on feet (R26.81);Muscle weakness (generalized) (M62.81);Difficulty in walking, not elsewhere classified (R26.2);Other abnormalities of gait and mobility (R26.89)    Time: 2536-6440 PT Time Calculation (min) (ACUTE ONLY): 45 min   Charges:   PT Evaluation $PT Eval Moderate Complexity: 1 Mod PT Treatments $Gait Training: 8-22 mins $Self Care/Home Management: 8-22        Deniece Ree PT, DPT, CBIS  Supplemental Physical Therapist [place]    Pager [phone] Acute Rehab Office [phone]

## 2018-07-01 NOTE — Progress Notes (Signed)
Patient with low grade fever through out day, encouraged to use Incentive Spirometer, demonstrated to RN proper technique and reached 1750. Tylenol given at 1553, current temp 101.2. Patient educated and encouraged on IS use. Pt verbalized understanding. Will continue to monitor.

## 2018-07-01 NOTE — Progress Notes (Signed)
Subjective: Pt stable - pain ok   Objective: Vital signs in last 24 hours: Temp:  [97.5 F (36.4 C)-100.4 F (38 C)] 100.4 F (38 C) (01/08 0426) Pulse Rate:  [49-66] 66 (01/08 0426) Resp:  [9-18] 16 (01/07 2015) BP: (114-163)/(45-66) 129/54 (01/08 0426) SpO2:  [87 %-100 %] 87 % (01/08 0426) Weight:  [103 kg] 103 kg (01/07 0947)  Intake/Output from previous day: 01/07 0701 - 01/08 0700 In: 4171 [P.O.:200; I.V.:1475] Out: 50 [Blood:50] Intake/Output this shift: No intake/output data recorded.  Exam:  Dorsiflexion/Plantar flexion intact Compartment soft  Labs: No results for input(s): HGB in the last 72 hours. No results for input(s): WBC, RBC, HCT, PLT in the last 72 hours. No results for input(s): NA, K, CL, CO2, BUN, CREATININE, GLUCOSE, CALCIUM in the last 72 hours. No results for input(s): LABPT, INR in the last 72 hours.  Assessment/Plan: Plan PT today - dc ace wrap - NEEDS CPM TODAY - dc friday   G Scott Jesusita Jocelyn 07/01/2018, 7:22 AM

## 2018-07-02 ENCOUNTER — Encounter
Admission: RE | Admit: 2018-07-02 | Discharge: 2018-07-02 | Disposition: A | Discharge status: Home / Self Care | Payer: Medicare HMO | Source: Ambulatory Visit | Attending: Internal Medicine | Admitting: Internal Medicine

## 2018-07-02 NOTE — Progress Notes (Signed)
Physical Therapy Treatment Patient Details Name: [name] MRN: [id number] DOB: August 19, 1955 Today's Date: 07/02/2018    History of Present Illness 63yo female who failed conservative management of L knee pain and had L TKA on 06/30/18. PMH mild cognitive impairment, hx bariatric surgery, LBP, HTN, peripheral neuropathy, DM, bowel incontinence, L frozen shoulder, R RCR, L LE surgery, R knee surgery     PT Comments    Patient seen for LE strengthening/ROM exercises. Pt is pleasant and participatory. Pt requires assistance for  exercises due to poor quad control and pain especially with flexion. HEP handout at bedside and pt encouraged to work on exercises this evening. Continue to progress as tolerated.    Follow Up Recommendations  Follow surgeon's recommendation for DC plan and follow-up therapies;Other (comment)(SNF )     Equipment Recommendations  Other (comment)(defer to next venue )    Recommendations for Other Services       Precautions / Restrictions Precautions Precautions: Fall Required Braces or Orthoses: Knee Immobilizer - Left Knee Immobilizer - Left: On except when in CPM;On when out of bed or walking Restrictions Weight Bearing Restrictions: Yes LLE Weight Bearing: Weight bearing as tolerated    Mobility  Bed Mobility                 Transfers            Ambulation/Gait       Stairs             Wheelchair Mobility    Modified Rankin (Stroke Patients Only)       Balance Overall balance assessment: Needs assistance Sitting-balance support: Bilateral upper extremity supported;Feet supported Sitting balance-Leahy Scale: Good     Standing balance support: Bilateral upper extremity supported;During functional activity Standing balance-Leahy Scale: Poor Standing balance comment: heavy reliance on B UE support                             Cognition Arousal/Alertness: Awake/alert Behavior During Therapy: WFL for tasks  assessed/performed Overall Cognitive Status: Within Functional Limits for tasks assessed                                        Exercises Total Joint Exercises Ankle Circles/Pumps: AROM;Both;20 reps;Supine Quad Sets: AROM;Strengthening;Both;10 reps;Supine Short Arc Quad: AAROM;Strengthening;Left;10 reps;Supine Heel Slides: AAROM;Left;10 reps;Supine;Strengthening Hip ABduction/ADduction: AAROM;Strengthening;Left;10 reps;Supine Straight Leg Raises: AAROM;Strengthening;Left;10 reps;Supine    General Comments       Pertinent Vitals/Pain Pain Assessment: Faces Pain Score: 6  Faces Pain Scale: Hurts even more Pain Location: L knee with flexion (3/10 at rest) Pain Descriptors / Indicators: Sore;Grimacing;Guarding Pain Intervention(s): Limited activity within patient's tolerance;Monitored during session;Repositioned    Home Living                      Prior Function            PT Goals (current goals can now be found in the care plan section) Acute Rehab PT Goals Patient Stated Goal: go to rehab before going home  Progress towards PT goals: Progressing toward goals    Frequency    7X/week      PT Plan Current plan remains appropriate    Co-evaluation              AM-PAC PT "6 Clicks" Mobility   Outcome  Measure  Help needed turning from your back to your side while in a flat bed without using bedrails?: A Little Help needed moving from lying on your back to sitting on the side of a flat bed without using bedrails?: A Little Help needed moving to and from a bed to a chair (including a wheelchair)?: A Lot Help needed standing up from a chair using your arms (e.g., wheelchair or bedside chair)?: A Lot Help needed to walk in hospital room?: A Lot Help needed climbing 3-5 steps with a railing? : Total 6 Click Score: 13    End of Session Equipment Utilized During Treatment: Gait belt Activity Tolerance: Patient tolerated treatment  well Patient left: with call bell/phone within reach;in bed;Other (comment)(ortho tech present to place L LE in CPM) Nurse Communication: Mobility status;Other (comment) PT Visit Diagnosis: Unsteadiness on feet (R26.81);Muscle weakness (generalized) (M62.81);Difficulty in walking, not elsewhere classified (R26.2);Other abnormalities of gait and mobility (R26.89)     Time: 1406-1430 PT Time Calculation (min) (ACUTE ONLY): 24 min  Charges:   $Therapeutic Exercise: 23-37 mins                     Earney Navy, PTA Acute Rehabilitation Services Pager: [phone] Office: [phone]     Darliss Cheney 07/02/2018, 2:43 PM

## 2018-07-02 NOTE — Progress Notes (Signed)
Dr Ninfa Linden returned call- no new orders.

## 2018-07-02 NOTE — Progress Notes (Signed)
Subjective: Pt stable = pain ok   Objective: Vital signs in last 24 hours: Temp:  [98.1 F (36.7 C)-101.2 F (38.4 C)] 98.1 F (36.7 C) (01/09 0907) Pulse Rate:  [73-80] 73 (01/09 0907) Resp:  [20] 20 (01/09 0907) BP: (154-179)/(53-62) 179/62 (01/09 0907) SpO2:  [84 %-97 %] 97 % (01/09 0907)  Intake/Output from previous day: 01/08 0701 - 01/09 0700 In: 1320 [P.O.:1320] Out: 2250 [Urine:2250] Intake/Output this shift: No intake/output data recorded.  Exam:  Sensation intact distally Incision: no drainage  Labs: No results for input(s): HGB in the last 72 hours. No results for input(s): WBC, RBC, HCT, PLT in the last 72 hours. No results for input(s): NA, K, CL, CO2, BUN, CREATININE, GLUCOSE, CALCIUM in the last 72 hours. No results for input(s): LABPT, INR in the last 72 hours.  Assessment/Plan: Plan dc am to snf   [employer] 07/02/2018, 1:01 PM

## 2018-07-02 NOTE — Clinical Social Work Note (Signed)
Clinical Social Work Assessment  Patient Details  Name: [name] MRN: [id number] Date of Birth: 1956-05-30  Date of referral:  07/02/18               Reason for consult:  Discharge Planning                Permission sought to share information with:  Case Manager, Facility Sport and exercise psychologist, Family Supports Permission granted to share information::  Yes, Verbal Permission Granted  Name::     Public librarian::  SNFs  Relationship::  spouse  Contact Information:  [phone]  Housing/Transportation Living arrangements for the past 2 months:  [place] of Information:  Patient Patient Interpreter Needed:  None Criminal Activity/Legal Involvement Pertinent to Current Situation/Hospitalization:  No - Comment as needed Significant Relationships:  Spouse Lives with:  Spouse Do you feel safe going back to the place where you live?  No Need for family participation in patient care:  Yes (Comment)  Care giving concerns:  CSW received referral for possible SNF placement at time of discharge. Spoke with patient regarding possibility of SNF placement . Patient's husband is a paraplegic and  is currently unable to care for her at their home given patient's current needs and fall risk.  Patient  expressed understanding of PT recommendation and are agreeable to SNF placement at time of discharge. CSW to continue to follow and assist with discharge planning needs.     Social Worker assessment / plan:  Spoke with patient concerning possibility of rehab at SNF before returning home.    Employment status:  Retired Nurse, adult PT Recommendations:  [place] / Referral to community resources:  [place]  Patient/Family's Response to care:  Patient recognizes need for rehab before returning home and are agreeable to a SNF in [place] or [place] area. They report preference for [place], [place], or any  place in / with a private bed as this is very important to patient for her to have a private room . CSW explained insurance authorization process. Patient's family reported that they want patient to get stronger to be able to come back home.    Patient/Family's Understanding of and Emotional Response to Diagnosis, Current Treatment, and Prognosis:  Patient/family is realistic regarding therapy needs and expressed being hopeful for SNF placement. Patient expressed understanding of CSW role and discharge process as well as medical condition. No questions/concerns about plan or treatment.    Emotional Assessment Appearance:  Appears stated age Attitude/Demeanor/Rapport:  Self-Confident, Gracious Affect (typically observed):  Accepting, Adaptable Orientation:  Oriented to Self, Oriented to Place, Oriented to  Time, Oriented to Situation Alcohol / Substance use:  Not Applicable Psych involvement (Current and /or in the community):  No (Comment)  Discharge Needs  Concerns to be addressed:  Discharge Planning Concerns Readmission within the last 30 days:  No Current discharge risk:  Dependent with Mobility Barriers to Discharge:  Continued Medical Work up   [employer], [place] 07/02/2018, 2:11 PM

## 2018-07-02 NOTE — NC FL2 (Signed)
[place] MEDICAID FL2 LEVEL OF CARE SCREENING TOOL     IDENTIFICATION  Patient Name: [name] Birthdate: 19-Nov-1955 Sex: female Admission Date (Current Location): 06/30/2018  [facility] and [place] Number:  Engineering geologist and Address:  [facility], [place] [address] [place], [place] [address]      Provider Number: [id number]  Attending Physician Name and Address:  Meredith Pel, MD  Relative Name and Phone Number:  Marya Amsler [phone]    Current Level of Care: Hospital Recommended Level of Care: [place] Prior Approval Number:    Date Approved/Denied:   PASRR Number: [id number]  Discharge Plan: SNF    Current Diagnoses: Patient Active Problem List   Diagnosis Date Noted  . Arthritis of knee 06/30/2018  . Mild cognitive impairment 04/30/2018  . Venous stasis dermatitis of right lower extremity 01/02/2018  . Genetic testing 11/24/2017  . Family history of colon cancer   . B12 deficiency 12/13/2016  . Malabsorption 03/12/2016  . Tubular adenoma of colon 03/09/2016  . Vitamin D deficiency 03/09/2016  . Rectovaginal fistula 05/25/2014  . S/P bariatric surgery-duodenal switch with sleeve gastrectomy 12/04/2013  . Nonobstructive CAD  08/26/2013  . Iron deficiency anemia 06/19/2012  . Low back pain 03/05/2012  . Osteoarthritis of knee 04/23/2011  . UNSPECIFIED VENOUS INSUFFICIENCY 05/28/2010  . PSORIASIS 05/28/2010  . HYPERCHOLESTEROLEMIA 03/20/2010  . Essential hypertension 03/20/2010  . Chronic dermatitis of hands 05/02/2009  . Peripheral neuropathy 01/12/2009  . Type 2 diabetes, controlled, with neuropathy ([place]) 02/18/2008  . Chronic depression 10/06/2007  . OBSTRUCTIVE SLEEP APNEA 06/12/2007  . GOITER, MULTINODULAR 05/13/2007    Orientation RESPIRATION BLADDER Height & Weight     Self, Time, Situation, Place  Normal, O2(occasional 02 needs after ambulation, most recent of nasal cannula 3L/min after up with  PT) Continent Weight: 103 kg Height:  5' 7.5" (171.5 cm)  BEHAVIORAL SYMPTOMS/MOOD NEUROLOGICAL BOWEL NUTRITION STATUS      Incontinent Diet(see discharge summary)  AMBULATORY STATUS COMMUNICATION OF NEEDS Skin   Limited Assist Verbally Surgical wounds(closed surgical incision left leg with silver hydrofiber dressing)                       Personal Care Assistance Level of Assistance  Bathing, Feeding, Dressing, Total care Bathing Assistance: Limited assistance Feeding assistance: Independent Dressing Assistance: Limited assistance Total Care Assistance: Limited assistance   Functional Limitations Info  Sight, Hearing, Speech Sight Info: Impaired Hearing Info: Adequate Speech Info: Adequate    SPECIAL CARE FACTORS FREQUENCY  PT (By licensed PT), OT (By licensed OT)     PT Frequency: min 5x weelky OT Frequency: min 5x weekly            Contractures Contractures Info: Not present    Additional Factors Info  Allergies, Code Status Code Status Info: full Allergies Info: cephalexin, latex, neomycin-bacitracin Znpolymyx, Tape           Current Medications (07/02/2018):  This is the current hospital active medication list Current Facility-Administered Medications  Medication Dose Route Frequency Provider Last Rate Last Dose  . acetaminophen (TYLENOL) tablet 325-650 mg  325-650 mg Oral Q6H PRN Meredith Pel, MD   650 mg at 07/01/18 1553  . apixaban (ELIQUIS) tablet 2.5 mg  2.5 mg Oral Q12H Meredith Pel, MD   2.5 mg at 07/02/18 0830  . docusate sodium (COLACE) capsule 100 mg  100 mg Oral BID Marlou Sa Tonna Corner, MD  100 mg at 07/02/18 0937  . gabapentin (NEURONTIN) capsule 300 mg  300 mg Oral TID Meredith Pel, MD   300 mg at 07/02/18 1594  . HYDROmorphone (DILAUDID) injection 0.5 mg  0.5 mg Intravenous Q4H PRN Meredith Pel, MD      . lactated ringers infusion   Intravenous Continuous Marlou Sa Tonna Corner, MD 50 mL/hr at 06/30/18 [phone]    .  menthol-cetylpyridinium (CEPACOL) lozenge 3 mg  1 lozenge Oral PRN Meredith Pel, MD       Or  . phenol (CHLORASEPTIC) mouth spray 1 spray  1 spray Mouth/Throat PRN Meredith Pel, MD      . methocarbamol (ROBAXIN) tablet 500 mg  500 mg Oral Q6H PRN Meredith Pel, MD   500 mg at 07/02/18 1518   Or  . methocarbamol (ROBAXIN) 500 mg in dextrose 5 % 50 mL IVPB  500 mg Intravenous Q6H PRN Meredith Pel, MD      . metoCLOPramide (REGLAN) tablet 5-10 mg  5-10 mg Oral Q8H PRN Meredith Pel, MD       Or  . metoCLOPramide (REGLAN) injection 5-10 mg  5-10 mg Intravenous Q8H PRN Meredith Pel, MD      . ondansetron [facility]) tablet 4 mg  4 mg Oral Q6H PRN Meredith Pel, MD       Or  . ondansetron [facility]) injection 4 mg  4 mg Intravenous Q6H PRN Meredith Pel, MD      . oxyCODONE (Oxy IR/ROXICODONE) immediate release tablet 5-10 mg  5-10 mg Oral Q4H PRN Meredith Pel, MD   5 mg at 07/02/18 3437     Discharge Medications: Please see discharge summary for a list of discharge medications.  Relevant Imaging Results:  Relevant Lab Results:   Additional Information SSN: [id number]  Alberteen Sam, LCSW

## 2018-07-02 NOTE — Progress Notes (Signed)
[name]  After 5 pm call- notified the calling service for fever of 101.1  Tylenol 650 mg has been given and awaiting for return call.

## 2018-07-02 NOTE — Progress Notes (Signed)
Physical Therapy Treatment Patient Details Name: [name] MRN: [id number] DOB: [birth year] Today's Date: 07/02/2018    History of Present Illness 63yo female who failed conservative management of L knee pain and had L TKA on 06/30/18. PMH mild cognitive impairment, hx bariatric surgery, LBP, HTN, peripheral neuropathy, DM, bowel incontinence, L frozen shoulder, R RCR, L LE surgery, R knee surgery     PT Comments    Patient seen for mobility progression. Pt is making gradual progress toward PT goals and able to ambulate 35 ft with RW and mod A (+2 for chair follow) and mod cues. Pt becomes SOB and needs cues for breathing technique. SpO2 91-93% on RA with mobility. Continue to progress as tolerated.   Follow Up Recommendations  Follow surgeon's recommendation for DC plan and follow-up therapies;Other (comment)(SNF )     Equipment Recommendations  Other (comment)(defer to next venue )    Recommendations for Other Services       Precautions / Restrictions Precautions Precautions: Fall Required Braces or Orthoses: Knee Immobilizer - Left Knee Immobilizer - Left: On except when in CPM;On when out of bed or walking Restrictions Weight Bearing Restrictions: Yes LLE Weight Bearing: Weight bearing as tolerated    Mobility  Bed Mobility               General bed mobility comments: pt sitting EOB upon arrival  Transfers Overall transfer level: Needs assistance Equipment used: Rolling walker (2 wheeled) Transfers: Sit to/from Stand Sit to Stand: Mod assist         General transfer comment: assist to power up into standing; cues for safe hand placement  Ambulation/Gait Ambulation/Gait assistance: Mod assist;+2 safety/equipment(chair follow) Gait Distance (Feet): 35 Feet Assistive device: Rolling walker (2 wheeled) Gait Pattern/deviations: Step-to pattern;Decreased stance time - left;Decreased step length - right;Decreased weight shift to left;Decreased dorsiflexion -  right;Decreased dorsiflexion - left;Antalgic;Trunk flexed     General Gait Details: assist for balance and guiding RW; cues for upright posture, sequencing, safe proximity to RW, and increased bilat step height; pt needs cues for breathing technique   Stairs             Wheelchair Mobility    Modified Rankin (Stroke Patients Only)       Balance Overall balance assessment: Needs assistance Sitting-balance support: Bilateral upper extremity supported;Feet supported Sitting balance-Leahy Scale: Good     Standing balance support: Bilateral upper extremity supported;During functional activity Standing balance-Leahy Scale: Poor Standing balance comment: heavy reliance on B UE support                             Cognition Arousal/Alertness: Awake/alert Behavior During Therapy: WFL for tasks assessed/performed Overall Cognitive Status: Within Functional Limits for tasks assessed                                        Exercises      General Comments General comments (skin integrity, edema, etc.): SpO2 remained 91-93% on RA with mobility      Pertinent Vitals/Pain Pain Assessment: 0-10 Pain Score: 7  Pain Location: L knee  Pain Descriptors / Indicators: Aching;Sore Pain Intervention(s): Limited activity within patient's tolerance;Monitored during session;Repositioned;Patient requesting pain meds-RN notified;RN gave pain meds during session;Ice applied    Home Living  Prior Function            PT Goals (current goals can now be found in the care plan section) Acute Rehab PT Goals Patient Stated Goal: go to rehab before going home  Progress towards PT goals: Progressing toward goals    Frequency    7X/week      PT Plan Current plan remains appropriate    Co-evaluation              AM-PAC PT "6 Clicks" Mobility   Outcome Measure  Help needed turning from your back to your side while in a  flat bed without using bedrails?: A Little Help needed moving from lying on your back to sitting on the side of a flat bed without using bedrails?: A Little Help needed moving to and from a bed to a chair (including a wheelchair)?: A Lot Help needed standing up from a chair using your arms (e.g., wheelchair or bedside chair)?: A Lot Help needed to walk in hospital room?: A Lot Help needed climbing 3-5 steps with a railing? : Total 6 Click Score: 13    End of Session Equipment Utilized During Treatment: Gait belt Activity Tolerance: Patient limited by pain Patient left: with call bell/phone within reach;in chair;with chair alarm set;Other (comment)(L knee in extension) Nurse Communication: Mobility status;Other (comment) PT Visit Diagnosis: Unsteadiness on feet (R26.81);Muscle weakness (generalized) (M62.81);Difficulty in walking, not elsewhere classified (R26.2);Other abnormalities of gait and mobility (R26.89)     Time: 5462-7035 PT Time Calculation (min) (ACUTE ONLY): 29 min  Charges:  $Gait Training: 23-37 mins                     Earney Navy, PTA Acute Rehabilitation Services Pager: [phone] Office: [phone]     Darliss Cheney 07/02/2018, 11:42 AM

## 2018-07-03 ENCOUNTER — Inpatient Hospital Stay (INDEPENDENT_AMBULATORY_CARE_PROVIDER_SITE_OTHER): Payer: Medicare HMO | Admitting: Orthopedic Surgery

## 2018-07-03 MED ORDER — APIXABAN 5 MG PO TABS
5.0000 mg | ORAL_TABLET | Freq: Two times a day (BID) | ORAL | Status: DC
  Administered 2018-07-03 – 2018-07-06 (×7): 5 mg via ORAL
  Filled 2018-07-03 (×7): qty 1

## 2018-07-03 MED ORDER — DOCUSATE SODIUM 100 MG PO CAPS: 100.0000 mg | ORAL_CAPSULE | Freq: Two times a day (BID) | ORAL | 0 refills | Status: DC

## 2018-07-03 MED ORDER — OXYCODONE HCL 5 MG PO TABS: 5.0000 mg | ORAL_TABLET | ORAL | 0 refills | Status: DC | PRN

## 2018-07-03 MED ORDER — ACETAMINOPHEN 325 MG PO TABS: 325.0000 mg | ORAL_TABLET | Freq: Four times a day (QID) | ORAL | 0 refills | Status: DC | PRN

## 2018-07-03 MED ORDER — METHOCARBAMOL 500 MG PO TABS: 500.0000 mg | ORAL_TABLET | Freq: Four times a day (QID) | ORAL | 0 refills | Status: DC | PRN

## 2018-07-03 NOTE — Progress Notes (Signed)
Subjective: Pt stable - pain ok   Objective: Vital signs in last 24 hours: Temp:  [98.1 F (36.7 C)-101.1 F (38.4 C)] 98.8 F (37.1 C) (01/10 0300) Pulse Rate:  [67-80] 80 (01/10 0300) Resp:  [16-20] 18 (01/10 0300) BP: (136-208)/(51-74) 136/74 (01/10 0300) SpO2:  [93 %-100 %] 100 % (01/10 0300)  Intake/Output from previous day: 01/09 0701 - 01/10 0700 In: 480 [P.O.:480] Out: -  Intake/Output this shift: No intake/output data recorded.  Exam:  Dorsiflexion/Plantar flexion intact  Labs: No results for input(s): HGB in the last 72 hours. No results for input(s): WBC, RBC, HCT, PLT in the last 72 hours. No results for input(s): NA, K, CL, CO2, BUN, CREATININE, GLUCOSE, CALCIUM in the last 72 hours. No results for input(s): LABPT, INR in the last 72 hours.  Assessment/Plan: Plan dc today to snf vs sat - dc summ done - no evidence of infxn   [employer] 07/03/2018, 7:56 AM

## 2018-07-03 NOTE — Care Management Important Message (Signed)
Important Message  Patient Details  Name: [name] MRN: [id number] Date of Birth: 09/11/1955   Medicare Important Message Given:  Yes    Ambrose Wile [address] 07/03/2018, 3:41 PM

## 2018-07-03 NOTE — Progress Notes (Signed)
Physical Therapy Treatment Patient Details Name: [name] MRN: [id number] DOB: 1955/11/21 Today's Date: 07/03/2018    History of Present Illness 63yo female who failed conservative management of L knee pain and had L TKA on 06/30/18. PMH mild cognitive impairment, hx bariatric surgery, LBP, HTN, peripheral neuropathy, DM, bowel incontinence, L frozen shoulder, R RCR, L LE surgery, R knee surgery     PT Comments    Patient seen for mobility progression. Pt is making progress toward PT goals and tolerated increased gait training distance this session. Pt reports decreased pain today. Pt does continue to need assistance for functional transfers and for gait training due to unsteadiness and heavy reliance on bilat UE support in standing. Continue to progress as tolerated.    Follow Up Recommendations  Follow surgeon's recommendation for DC plan and follow-up therapies;Other (comment)(SNF )     Equipment Recommendations  Other (comment)(defer to next venue )    Recommendations for Other Services       Precautions / Restrictions Precautions Precautions: Fall Required Braces or Orthoses: Knee Immobilizer - Left Knee Immobilizer - Left: On except when in CPM;On when out of bed or walking Restrictions Weight Bearing Restrictions: Yes LLE Weight Bearing: Weight bearing as tolerated    Mobility  Bed Mobility Overal bed mobility: Needs Assistance Bed Mobility: Supine to Sit     Supine to sit: Min assist     General bed mobility comments: use of rails; assist to bring L LE to EOB  Transfers Overall transfer level: Needs assistance Equipment used: Rolling walker (2 wheeled) Transfers: Sit to/from Stand Sit to Stand: Mod assist         General transfer comment: assist to power up into standing; pt able to assist more today due to decreased pain and improving knee flexion in sitting   Ambulation/Gait Ambulation/Gait assistance: +2 safety/equipment;Min assist;Mod assist(chair  follow but not needed) Gait Distance (Feet): 100 Feet Assistive device: Rolling walker (2 wheeled) Gait Pattern/deviations: Step-to pattern;Decreased stance time - left;Decreased step length - right;Decreased weight shift to left;Decreased dorsiflexion - right;Decreased dorsiflexion - left;Antalgic;Trunk flexed;Step-through pattern Gait velocity: decreased   General Gait Details: needs assist to steady and at times to maintain safe proximity to RW; mod cues for posture, L knee flexion during swing phase, L heel strike/toe off, and L quad activation during stance phase; pt with tendency for circumduction to advance L LE to avoid knee flexion; pt wiht improving gait mechanics and step through pattern with distance    Stairs             Wheelchair Mobility    Modified Rankin (Stroke Patients Only)       Balance Overall balance assessment: Needs assistance Sitting-balance support: Bilateral upper extremity supported;Feet supported Sitting balance-Leahy Scale: Good     Standing balance support: Bilateral upper extremity supported;During functional activity Standing balance-Leahy Scale: Poor Standing balance comment: heavy reliance on B UE support                             Cognition Arousal/Alertness: Awake/alert Behavior During Therapy: WFL for tasks assessed/performed Overall Cognitive Status: Within Functional Limits for tasks assessed                                        Exercises      General Comments  Pertinent Vitals/Pain Pain Assessment: Faces Faces Pain Scale: Hurts little more Pain Location: L knee Pain Descriptors / Indicators: Sore;Guarding Pain Intervention(s): Limited activity within patient's tolerance;Monitored during session;Premedicated before session;Repositioned    Home Living                      Prior Function            PT Goals (current goals can now be found in the care plan section)  Progress towards PT goals: Progressing toward goals    Frequency    7X/week      PT Plan Current plan remains appropriate    Co-evaluation              AM-PAC PT "6 Clicks" Mobility   Outcome Measure  Help needed turning from your back to your side while in a flat bed without using bedrails?: A Little Help needed moving from lying on your back to sitting on the side of a flat bed without using bedrails?: A Little Help needed moving to and from a bed to a chair (including a wheelchair)?: A Little Help needed standing up from a chair using your arms (e.g., wheelchair or bedside chair)?: A Lot Help needed to walk in hospital room?: A Little Help needed climbing 3-5 steps with a railing? : A Lot 6 Click Score: 16    End of Session Equipment Utilized During Treatment: Gait belt Activity Tolerance: Patient tolerated treatment well Patient left: with call bell/phone within reach;in chair Nurse Communication: Mobility status PT Visit Diagnosis: Unsteadiness on feet (R26.81);Muscle weakness (generalized) (M62.81);Difficulty in walking, not elsewhere classified (R26.2);Other abnormalities of gait and mobility (R26.89)     Time: 1010-1032 PT Time Calculation (min) (ACUTE ONLY): 22 min  Charges:  $Gait Training: 8-22 mins                     Earney Navy, PTA Acute Rehabilitation Services Pager: [phone] Office: [phone]     Darliss Cheney 07/03/2018, 3:24 PM

## 2018-07-04 ENCOUNTER — Other Ambulatory Visit: Payer: Self-pay | Admitting: Family Medicine

## 2018-07-04 NOTE — Progress Notes (Signed)
Doing well with therapy. Pain controlled. Waiting on SNF which may be possible Monday. Meanwhile working again with therapist now.

## 2018-07-04 NOTE — Progress Notes (Signed)
Physical Therapy Treatment Patient Details Name: [name] MRN: [id number] DOB: 1955-11-27 Today's Date: 07/04/2018    History of Present Illness 63yo female who failed conservative management of L knee pain and had L TKA on 06/30/18. PMH mild cognitive impairment, hx bariatric surgery, LBP, HTN, peripheral neuropathy, DM, bowel incontinence, L frozen shoulder, R RCR, L LE surgery, R knee surgery     PT Comments    Patient continues to make progress toward PT goals and is demonstrating improved gait mechanics, ROM, and quad control. Pt needs assistance for OOB mobility due to heavy reliance on RW and unsteadiness. Continue to progress as tolerated.     Follow Up Recommendations  Follow surgeon's recommendation for DC plan and follow-up therapies;Other (comment)(SNF )     Equipment Recommendations  Other (comment)(defer to next venue )    Recommendations for Other Services       Precautions / Restrictions Precautions Precautions: Fall Required Braces or Orthoses: Knee Immobilizer - Left Knee Immobilizer - Left: On except when in CPM;On when out of bed or walking Restrictions Weight Bearing Restrictions: Yes LLE Weight Bearing: Weight bearing as tolerated    Mobility  Bed Mobility               General bed mobility comments: pt OOB in chair upon arrival   Transfers Overall transfer level: Needs assistance Equipment used: Rolling walker (2 wheeled) Transfers: Sit to/from Stand Sit to Stand: Min assist         General transfer comment: assist to power up into standing and for safe descent into sitting   Ambulation/Gait Ambulation/Gait assistance: Min assist;Min guard Gait Distance (Feet): 120 Feet Assistive device: Rolling walker (2 wheeled) Gait Pattern/deviations: Decreased stance time - left;Decreased step length - right;Decreased weight shift to left;Antalgic;Trunk flexed;Step-through pattern;Step-to pattern Gait velocity: decreased   General Gait  Details: decreased cadence and heavy reliance on RW; pt demonstrates carry over from previous sessions and with improving gait mechanics and step through pattern; pt is unsteady when working on step length symmetry due to longer stride but difficulty maintaining safe proximity to [employer]    Modified Rankin (Stroke Patients Only)       Balance Overall balance assessment: Needs assistance Sitting-balance support: Bilateral upper extremity supported;Feet supported Sitting balance-Leahy Scale: Good     Standing balance support: Bilateral upper extremity supported;During functional activity Standing balance-Leahy Scale: Poor Standing balance comment: heavy reliance on B UE support                             Cognition Arousal/Alertness: Awake/alert Behavior During Therapy: WFL for tasks assessed/performed Overall Cognitive Status: Within Functional Limits for tasks assessed                                        Exercises Total Joint Exercises Short Arc Quad: AAROM;Strengthening;Left;10 reps;AROM Heel Slides: AAROM;Left;10 reps;Strengthening Straight Leg Raises: AAROM;Strengthening;Left;10 reps;AROM    General Comments        Pertinent Vitals/Pain Pain Assessment: Faces Faces Pain Scale: Hurts little more Pain Location: L knee Pain Descriptors / Indicators: Sore Pain Intervention(s): Limited activity within patient's tolerance;Monitored during session;Premedicated before session;Repositioned    Home Living  Prior Function            PT Goals (current goals can now be found in the care plan section) Acute Rehab PT Goals Patient Stated Goal: go to rehab before going home  Progress towards PT goals: Progressing toward goals    Frequency    7X/week      PT Plan Current plan remains appropriate    Co-evaluation              AM-PAC PT "6 Clicks" Mobility    Outcome Measure  Help needed turning from your back to your side while in a flat bed without using bedrails?: A Little Help needed moving from lying on your back to sitting on the side of a flat bed without using bedrails?: A Little Help needed moving to and from a bed to a chair (including a wheelchair)?: A Little Help needed standing up from a chair using your arms (e.g., wheelchair or bedside chair)?: A Little Help needed to walk in hospital room?: A Little Help needed climbing 3-5 steps with a railing? : A Lot 6 Click Score: 17    End of Session Equipment Utilized During Treatment: Gait belt Activity Tolerance: Patient tolerated treatment well Patient left: with call bell/phone within reach;in chair Nurse Communication: Mobility status PT Visit Diagnosis: Unsteadiness on feet (R26.81);Muscle weakness (generalized) (M62.81);Difficulty in walking, not elsewhere classified (R26.2);Other abnormalities of gait and mobility (R26.89)     Time: 1353-1430 PT Time Calculation (min) (ACUTE ONLY): 37 min  Charges:  $Gait Training: 8-22 mins $Therapeutic Exercise: 8-22 mins                     Earney Navy, PTA Acute Rehabilitation Services Pager: [phone] Office: [phone]     Darliss Cheney 07/04/2018, 2:52 PM

## 2018-07-05 MED ORDER — AMLODIPINE BESYLATE 10 MG PO TABS
10.0000 mg | ORAL_TABLET | Freq: Every day | ORAL | Status: DC
  Administered 2018-07-05 – 2018-07-06 (×2): 10 mg via ORAL
  Filled 2018-07-05 (×2): qty 1

## 2018-07-05 NOTE — Progress Notes (Signed)
   Subjective: 5 Days Post-Op Procedure(s) (LRB): LEFT TOTAL KNEE ARTHROPLASTY (Left) Patient reports pain as moderate.    Objective: Vital signs in last 24 hours: Temp:  [98.2 F (36.8 C)-99.3 F (37.4 C)] 98.4 F (36.9 C) (01/12 1229) Pulse Rate:  [57-69] 69 (01/12 1229) Resp:  [15-18] 18 (01/12 1229) BP: (131-172)/(51-80) 131/51 (01/12 1229) SpO2:  [91 %-100 %] 97 % (01/12 1229)  Intake/Output from previous day: 01/11 0701 - 01/12 0700 In: 46 [P.O.:590] Out: -  Intake/Output this shift: Total I/O In: 360 [P.O.:360] Out: -   No results for input(s): HGB in the last 72 hours. No results for input(s): WBC, RBC, HCT, PLT in the last 72 hours. No results for input(s): NA, K, CL, CO2, BUN, CREATININE, GLUCOSE, CALCIUM in the last 72 hours. No results for input(s): LABPT, INR in the last 72 hours.  Neurologically intact No results found.  Assessment/Plan: 5 Days Post-Op Procedure(s) (LRB): LEFT TOTAL KNEE ARTHROPLASTY (Left) Up with therapy , SNF when bed available  Marybelle Killings 07/05/2018, 1:30 PM

## 2018-07-05 NOTE — Plan of Care (Signed)
Problem: Clinical Measurements: Goal: Ability to maintain clinical measurements within normal limits will improve Outcome: Progressing Goal: Respiratory complications will improve Outcome: Progressing   Problem: Activity: Goal: Risk for activity intolerance will decrease Outcome: Progressing   Problem: Nutrition: Goal: Adequate nutrition will be maintained Outcome: Progressing   Problem: Coping: Goal: Level of anxiety will decrease Outcome: Progressing   Problem: Elimination: Goal: Will not experience complications related to urinary retention Outcome: Progressing   Problem: Pain Managment: Goal: General experience of comfort will improve Outcome: Progressing   Problem: Safety: Goal: Ability to remain free from injury will improve Outcome: Progressing   Problem: Skin Integrity: Goal: Risk for impaired skin integrity will decrease Outcome: Progressing

## 2018-07-05 NOTE — Progress Notes (Signed)
Physical Therapy Treatment Patient Details Name: [name] MRN: [id number] DOB: 27-Jun-1955 Today's Date: 07/05/2018    History of Present Illness 63yo female who failed conservative management of L knee pain and had L TKA on 06/30/18. PMH mild cognitive impairment, hx bariatric surgery, LBP, HTN, peripheral neuropathy, DM, bowel incontinence, L frozen shoulder, R RCR, L LE surgery, R knee surgery     PT Comments    The pt is progressing well with mobility with decreased overall assistance needed today and increased gait distance. Acute PT to continue during pt's hospital stay.   Follow Up Recommendations  Follow surgeon's recommendation for DC plan and follow-up therapies;Other (comment)(SNF)     Equipment Recommendations  Other (comment)    Precautions / Restrictions Precautions Precautions: Fall;Knee Required Braces or Orthoses: Knee Immobilizer - Left Knee Immobilizer - Left: On except when in CPM;On when out of bed or walking Restrictions Weight Bearing Restrictions: Yes LLE Weight Bearing: Weight bearing as tolerated    Mobility  Bed Mobility               General bed mobility comments: pt up in room walking from bathroom to chair with RW  Transfers Overall transfer level: Needs assistance Equipment used: Rolling walker (2 wheeled) Transfers: Sit to/from Stand Sit to Stand: Supervision         General transfer comment: demo'd safe technique with sitting to chair after session.   Ambulation/Gait Ambulation/Gait assistance: Supervision Gait Distance (Feet): 130 Feet Assistive device: Rolling walker (2 wheeled) Gait Pattern/deviations: Step-through pattern;Decreased stride length Gait velocity: decreased   General Gait Details: slow cadence. cues for increased left hip/knee flexion with swing phase for more normalized gait pattern. heavy relaince on RW, cues to relax UE's and place increased weight on LE's.         Cognition Arousal/Alertness:  Awake/alert Behavior During Therapy: WFL for tasks assessed/performed Overall Cognitive Status: Within Functional Limits for tasks assessed           Exercises Total Joint Exercises Straight Leg Raises: AAROM;Strengthening;Left;10 reps;Seated(with foot rest of recliner up) Marching in Standing: AROM;Strengthening;Left;5 reps;Standing     Pertinent Vitals/Pain Pain Assessment: 0-10 Pain Score: 3  Pain Location: L knee Pain Descriptors / Indicators: Aching;Sore;Operative site guarding Pain Intervention(s): Limited activity within patient's tolerance;Monitored during session;Premedicated before session;Repositioned     PT Goals (current goals can now be found in the care plan section) Acute Rehab PT Goals Patient Stated Goal: go to rehab before going home  PT Goal Formulation: With patient Time For Goal Achievement: 07/15/18 Potential to Achieve Goals: Good Progress towards PT goals: Progressing toward goals    Frequency    7X/week      PT Plan Current plan remains appropriate    AM-PAC PT "6 Clicks" Mobility   Outcome Measure  Help needed turning from your back to your side while in a flat bed without using bedrails?: A Little Help needed moving from lying on your back to sitting on the side of a flat bed without using bedrails?: A Little Help needed moving to and from a bed to a chair (including a wheelchair)?: None Help needed standing up from a chair using your arms (e.g., wheelchair or bedside chair)?: None Help needed to walk in hospital room?: None Help needed climbing 3-5 steps with a railing? : A Lot 6 Click Score: 20    End of Session Equipment Utilized During Treatment: Gait belt Activity Tolerance: Patient tolerated treatment well Patient left: with call bell/phone  within reach;in chair Nurse Communication: Mobility status PT Visit Diagnosis: Unsteadiness on feet (R26.81);Muscle weakness (generalized) (M62.81);Difficulty in walking, not elsewhere  classified (R26.2);Other abnormalities of gait and mobility (R26.89)     Time: 8719-5974 PT Time Calculation (min) (ACUTE ONLY): 18 min  Charges:  $Gait Training: 8-22 mins           [name] Ora, PTA, CLT Acute [employer] [phone] 07/05/18, 12:02 PM  [name] Ora 07/05/2018, 12:00 PM

## 2018-07-06 DIAGNOSIS — M6281 Muscle weakness (generalized): Secondary | ICD-10-CM | POA: Diagnosis not present

## 2018-07-06 DIAGNOSIS — G4733 Obstructive sleep apnea (adult) (pediatric): Secondary | ICD-10-CM | POA: Diagnosis not present

## 2018-07-06 DIAGNOSIS — Z9884 Bariatric surgery status: Secondary | ICD-10-CM | POA: Diagnosis not present

## 2018-07-06 DIAGNOSIS — I10 Essential (primary) hypertension: Secondary | ICD-10-CM | POA: Diagnosis not present

## 2018-07-06 DIAGNOSIS — M2550 Pain in unspecified joint: Secondary | ICD-10-CM | POA: Diagnosis not present

## 2018-07-06 DIAGNOSIS — F325 Major depressive disorder, single episode, in full remission: Secondary | ICD-10-CM | POA: Diagnosis not present

## 2018-07-06 DIAGNOSIS — E782 Mixed hyperlipidemia: Secondary | ICD-10-CM | POA: Diagnosis not present

## 2018-07-06 DIAGNOSIS — E785 Hyperlipidemia, unspecified: Secondary | ICD-10-CM | POA: Diagnosis not present

## 2018-07-06 DIAGNOSIS — G4710 Hypersomnia, unspecified: Secondary | ICD-10-CM | POA: Diagnosis not present

## 2018-07-06 DIAGNOSIS — Z7401 Bed confinement status: Secondary | ICD-10-CM | POA: Diagnosis not present

## 2018-07-06 DIAGNOSIS — M150 Primary generalized (osteo)arthritis: Secondary | ICD-10-CM | POA: Diagnosis not present

## 2018-07-06 DIAGNOSIS — K912 Postsurgical malabsorption, not elsewhere classified: Secondary | ICD-10-CM | POA: Diagnosis not present

## 2018-07-06 DIAGNOSIS — E1159 Type 2 diabetes mellitus with other circulatory complications: Secondary | ICD-10-CM | POA: Diagnosis not present

## 2018-07-06 DIAGNOSIS — M1712 Unilateral primary osteoarthritis, left knee: Secondary | ICD-10-CM | POA: Diagnosis not present

## 2018-07-06 DIAGNOSIS — Z471 Aftercare following joint replacement surgery: Secondary | ICD-10-CM | POA: Diagnosis not present

## 2018-07-06 DIAGNOSIS — Z96652 Presence of left artificial knee joint: Secondary | ICD-10-CM | POA: Diagnosis not present

## 2018-07-06 DIAGNOSIS — E1143 Type 2 diabetes mellitus with diabetic autonomic (poly)neuropathy: Secondary | ICD-10-CM | POA: Diagnosis not present

## 2018-07-06 DIAGNOSIS — E1140 Type 2 diabetes mellitus with diabetic neuropathy, unspecified: Secondary | ICD-10-CM | POA: Diagnosis not present

## 2018-07-06 DIAGNOSIS — F329 Major depressive disorder, single episode, unspecified: Secondary | ICD-10-CM | POA: Diagnosis not present

## 2018-07-06 DIAGNOSIS — I2510 Atherosclerotic heart disease of native coronary artery without angina pectoris: Secondary | ICD-10-CM | POA: Diagnosis not present

## 2018-07-06 DIAGNOSIS — E1169 Type 2 diabetes mellitus with other specified complication: Secondary | ICD-10-CM | POA: Diagnosis not present

## 2018-07-06 MED ORDER — FLEET ENEMA 7-19 GM/118ML RE ENEM
1.0000 | ENEMA | Freq: Once | RECTAL | Status: DC
  Filled 2018-07-06: qty 1

## 2018-07-06 NOTE — Discharge Summary (Signed)
Physician Discharge Summary  Patient ID: [name] MRN: [id number] DOB/AGE: May 08, 1956 63 y.o.  Admit date: 06/30/2018 Discharge date: 07/06/2018  Admission Diagnoses:  Active Problems:   Arthritis of knee   Discharge Diagnoses:  Same  Surgeries: Procedure(s): LEFT TOTAL KNEE ARTHROPLASTY on 06/30/2018   Consultants:   Discharged Condition: Stable  Hospital Course: [name] is an 63 y.o. female who was admitted 06/30/2018 with a chief complaint of left knee pain, and found to have a diagnosis of left knee arthritis.  They were brought to the operating room on 06/30/2018 and underwent the above named procedures.  Patient tolerated the procedure well without immediate complication.  She mobilizes reasonably well but somewhat slowly with physical therapy.  She was doing well walking in the halls at the time of discharge.  Back on Eliquis for DVT prophylaxis.  Anticipate skilled nursing rehab stay for about a week until she can go back home to a relatively independent home situation with her handicapped husband.  She will follow-up with me in about 9 days.  She should be weightbearing as tolerated.  Antibiotics given:  Anti-infectives (From admission, onward)   Start     Dose/Rate Route Frequency Ordered Stop   06/30/18 2230  vancomycin (VANCOCIN) IVPB 1000 mg/200 mL premix     1,000 mg 200 mL/hr over 60 Minutes Intravenous Every 12 hours 06/30/18 1844 06/30/18 2325   06/30/18 0900  vancomycin (VANCOCIN) 1,500 mg in sodium chloride 0.9 % 500 mL IVPB  Status:  Discontinued     1,500 mg 250 mL/hr over 120 Minutes Intravenous To [facility]Stay [facility] 06/29/18 1453 06/30/18 1833    .  Recent vital signs:  Vitals:   07/05/18 2015 07/06/18 0403  BP: (!) 153/59 (!) 157/60  Pulse: 69 (!) 57  Resp: 18 18  Temp: 98.4 F (36.9 C) 98.4 F (36.9 C)  SpO2: 99% 92%    Recent laboratory studies:  Results for orders placed or performed during the hospital encounter of 06/30/18  Glucose,  capillary  Result Value Ref Range   Glucose-Capillary 134 (H) 70 - 99 mg/dL  Glucose, capillary  Result Value Ref Range   Glucose-Capillary 166 (H) 70 - 99 mg/dL   Comment 1 Notify RN   Glucose, capillary  Result Value Ref Range   Glucose-Capillary 166 (H) 70 - 99 mg/dL   Comment 1 Notify RN   Glucose, capillary  Result Value Ref Range   Glucose-Capillary 140 (H) 70 - 99 mg/dL    Discharge Medications:   Allergies as of 07/06/2018      Reactions   Cephalexin Hives, Rash   Denies Airway involvement   Latex Rash   Neomycin-bacitracin Zn-polymyx Rash   Tape Rash   Latex Prefers paper tape      Medication List    STOP taking these medications   aspirin 81 MG tablet     TAKE these medications   ACCU-CHEK NANO SMARTVIEW w/Device Kit Use to check blood glucose four times a day   ACCU-CHEK AVIVA PLUS w/Device Kit Use to check sugar daily   accu-chek soft touch lancets Use to check sugar daily   acetaminophen 325 MG tablet Commonly known as:  TYLENOL Take 1-2 tablets (325-650 mg total) by mouth every 6 (six) hours as needed for mild pain (pain score 1-3 or temp > 100.5).   amLODipine 10 MG tablet Commonly known as:  NORVASC Take 1 tablet (10 mg total) by mouth daily.   atorvastatin 40 MG tablet  Commonly known as:  LIPITOR Take 1 tablet (40 mg total) by mouth daily.   docusate sodium 100 MG capsule Commonly known as:  COLACE Take 1 capsule (100 mg total) by mouth 2 (two) times daily.   ELIQUIS 5 MG Tabs tablet Generic drug:  apixaban Take 5 mg by mouth 2 (two) times daily.   gabapentin 300 MG capsule Commonly known as:  NEURONTIN TAKE 1 CAPSULE BY MOUTH THREE TIMES A DAY What changed:  See the new instructions.   gabapentin 100 MG capsule Commonly known as:  NEURONTIN Take 1 capsule (100 mg total) by mouth 2 (two) times daily. What changed:  Another medication with the same name was changed. Make sure you understand how and when to take each.   GATTEX 5  MG Kit Generic drug:  Teduglutide (rDNA) Inject 5 mg into the skin daily.   glucose blood test strip Commonly known as:  ACCU-CHEK AVIVA PLUS Use to check sugar once daily in the morning   lisinopril-hydrochlorothiazide 20-25 MG tablet Commonly known as:  PRINZIDE,ZESTORETIC Take 1 tablet by mouth daily.   methocarbamol 500 MG tablet Commonly known as:  ROBAXIN Take 1 tablet (500 mg total) by mouth every 6 (six) hours as needed for muscle spasms.   multivitamin-iron-minerals-folic acid chewable tablet Chew 4 tablets by mouth daily.   oxyCODONE 5 MG immediate release tablet Commonly known as:  Oxy IR/ROXICODONE Take 1-2 tablets (5-10 mg total) by mouth every 4 (four) hours as needed for moderate pain (pain score 4-6).   sertraline 25 MG tablet Commonly known as:  ZOLOFT Take 75 mg by mouth daily. What changed:  Another medication with the same name was removed. Continue taking this medication, and follow the directions you see here.       Diagnostic Studies: No results found.  Disposition: Discharge disposition: 03-Skilled Nursing Facility       Discharge Instructions    Call MD / Call 911   Complete by:  As directed    If you experience chest pain or shortness of breath, CALL 911 and be transported to the hospital emergency room.  If you develope a fever above 101 F, pus (white drainage) or increased drainage or redness at the wound, or calf pain, call your surgeon's office.   Call MD / Call 911   Complete by:  As directed    If you experience chest pain or shortness of breath, CALL 911 and be transported to the hospital emergency room.  If you develope a fever above 101 F, pus (white drainage) or increased drainage or redness at the wound, or calf pain, call your surgeon's office.   Constipation Prevention   Complete by:  As directed    Drink plenty of fluids.  Prune juice may be helpful.  You may use a stool softener, such as Colace (over the counter) 100 mg twice a  day.  Use MiraLax (over the counter) for constipation as needed.   Constipation Prevention   Complete by:  As directed    Drink plenty of fluids.  Prune juice may be helpful.  You may use a stool softener, such as Colace (over the counter) 100 mg twice a day.  Use MiraLax (over the counter) for constipation as needed.   Diet - low sodium heart healthy   Complete by:  As directed    Diet - low sodium heart healthy   Complete by:  As directed    Discharge instructions   Complete by:  As  directed    Weight bearing as tolerated on left Ok to shower cpm 1 hour 3 times a day - increase daily   Discharge instructions   Complete by:  As directed    Weightbearing as tolerated left leg CPM machine 1 hour 3 times a day Okay to shower, dressing waterproof Follow-up in clinic next week   Increase activity slowly as tolerated   Complete by:  As directed    Increase activity slowly as tolerated   Complete by:  As directed       Contact information for after-discharge care    Destination    [facility] Preferred SNF .   Service:  Skilled Nursing Contact information: [address] [place] [phone]               Signed: Anderson Malta 07/06/2018, 8:25 AM

## 2018-07-06 NOTE — Progress Notes (Signed)
Subjective: Patient stable.  Pain controlled.  She is ambulating in the hall.  No steps yet   Objective: Vital signs in last 24 hours: Temp:  [98.4 F (36.9 C)-98.5 F (36.9 C)] 98.4 F (36.9 C) (01/13 0403) Pulse Rate:  [57-69] 57 (01/13 0403) Resp:  [18] 18 (01/13 0403) BP: (131-158)/(51-60) 157/60 (01/13 0403) SpO2:  [92 %-99 %] 92 % (01/13 0403)  Intake/Output from previous day: 01/12 0701 - 01/13 0700 In: 840 [P.O.:840] Out: -  Intake/Output this shift: No intake/output data recorded.  Exam:  Intact pulses distally Dorsiflexion/Plantar flexion intact  Labs: No results for input(s): HGB in the last 72 hours. No results for input(s): WBC, RBC, HCT, PLT in the last 72 hours. No results for input(s): NA, K, CL, CO2, BUN, CREATININE, GLUCOSE, CALCIUM in the last 72 hours. No results for input(s): LABPT, INR in the last 72 hours.  Assessment/Plan: Plan at this time is discharged to skilled nursing facility today.  Prescription for pain medicine physical copy is on the chart.  Follow-up with me in approximately 8 days to 9 days.   G Scott Dean 07/06/2018, 8:21 AM

## 2018-07-06 NOTE — Clinical Social Work Placement (Signed)
   CLINICAL SOCIAL WORK PLACEMENT  NOTE  Date:  07/06/2018  Patient Details  Name: [name] MRN: [id number] Date of Birth: 06-Apr-1956  Clinical Social Work is seeking post-discharge placement for this patient at the [place] level of care (*CSW will initial, date and re-position this form in  chart as items are completed):      Patient/family provided with [facility] Work Department's list of facilities offering this level of care within the geographic area requested by the patient (or if unable, by the patient's family).  Yes   Patient/family informed of their freedom to choose among providers that offer the needed level of care, that participate in Medicare, Medicaid or managed care program needed by the patient, have an available bed and are willing to accept the patient.      Patient/family informed of [facility]'s ownership interest in [facility] and [facility], as well as of the fact that they are under no obligation to receive care at these facilities.  PASRR submitted to EDS on       PASRR number received on 07/02/18     Existing PASRR number confirmed on       FL2 transmitted to all facilities in geographic area requested by pt/family on 07/02/18     FL2 transmitted to all facilities within larger geographic area on 07/02/18     Patient informed that his/her managed care company has contracts with or will negotiate with certain facilities, including the following:        Yes   Patient/family informed of bed offers received.  Patient chooses bed at [facility]     Physician recommends and patient chooses bed at      Patient to be transferred to [facility] on 07/06/18.  Patient to be transferred to facility by [facility]     Patient family notified on 07/06/18 of transfer.  Name of family member notified:  Marya Amsler (spouse)     PHYSICIAN       Additional Comment:     _______________________________________________ Alberteen Sam, LCSW 07/06/2018, 11:03 AM

## 2018-07-06 NOTE — Progress Notes (Signed)
Fleets enema given-good results

## 2018-07-06 NOTE — Progress Notes (Signed)
CSW given all clear by RN to call [facility] again. [facility] called for pick up.   [place], [place]

## 2018-07-06 NOTE — Progress Notes (Signed)
Report was called to [place] at [facility]. Written discharge packet will be sent with the patient via [facility]

## 2018-07-06 NOTE — Progress Notes (Signed)
CSW consulted with patient who was slightly confused and upset regarding discharge. CSW reminded patient of previous assessment, placement option discussion and choice discuss. Patient reported she had not had bowel movement in several days, nurse aware in room at bedside. Nurse plans to provide assistance with bowel movement.   Patient pending discharge to SNF until bowel movement. [facility] turned away at this time, will call again for transport once bowel movement occurs.   [place], [place]

## 2018-07-06 NOTE — Plan of Care (Signed)

## 2018-07-06 NOTE — Progress Notes (Signed)
[facility] will not take the patient because she has not had a BM within 3 days.  Patient will be receiving a Fleet enema today.

## 2018-07-06 NOTE — Progress Notes (Signed)
Dr Marlou Sa was called via phone per Belva Chimes, RN in regards to reconciliation of discharge instructions.  Awaiting to be able to print AVS forms for facility.

## 2018-07-06 NOTE — Progress Notes (Signed)
Patient will DC [id number] Anticipated DC date: 07/06/2018 Family notified:[facility] Transport YM:EBRA  Per MD patient ready for DC to [facility] . RN, patient, patient's family, and facility notified of DC. Discharge Summary sent to facility. RN given number for report [phone] Room 213. DC packet on chart. Ambulance transport requested for patient.  CSW signing off.  [place], [place]

## 2018-07-07 ENCOUNTER — Non-Acute Institutional Stay (SKILLED_NURSING_FACILITY): Payer: Medicare HMO | Admitting: Adult Health

## 2018-07-07 ENCOUNTER — Encounter: Payer: Self-pay | Admitting: Adult Health

## 2018-07-07 DIAGNOSIS — F32A Depression, unspecified: Secondary | ICD-10-CM

## 2018-07-07 DIAGNOSIS — M150 Primary generalized (osteo)arthritis: Secondary | ICD-10-CM

## 2018-07-07 DIAGNOSIS — M8949 Other hypertrophic osteoarthropathy, multiple sites: Secondary | ICD-10-CM

## 2018-07-07 DIAGNOSIS — I10 Essential (primary) hypertension: Secondary | ICD-10-CM | POA: Diagnosis not present

## 2018-07-07 DIAGNOSIS — E1140 Type 2 diabetes mellitus with diabetic neuropathy, unspecified: Secondary | ICD-10-CM

## 2018-07-07 DIAGNOSIS — E1159 Type 2 diabetes mellitus with other circulatory complications: Secondary | ICD-10-CM

## 2018-07-07 DIAGNOSIS — E1169 Type 2 diabetes mellitus with other specified complication: Secondary | ICD-10-CM | POA: Diagnosis not present

## 2018-07-07 DIAGNOSIS — Z96652 Presence of left artificial knee joint: Secondary | ICD-10-CM

## 2018-07-07 DIAGNOSIS — E1143 Type 2 diabetes mellitus with diabetic autonomic (poly)neuropathy: Secondary | ICD-10-CM

## 2018-07-07 DIAGNOSIS — I2510 Atherosclerotic heart disease of native coronary artery without angina pectoris: Secondary | ICD-10-CM | POA: Insufficient documentation

## 2018-07-07 DIAGNOSIS — M159 Polyosteoarthritis, unspecified: Secondary | ICD-10-CM

## 2018-07-07 DIAGNOSIS — F325 Major depressive disorder, single episode, in full remission: Secondary | ICD-10-CM | POA: Insufficient documentation

## 2018-07-07 DIAGNOSIS — M1712 Unilateral primary osteoarthritis, left knee: Secondary | ICD-10-CM

## 2018-07-07 DIAGNOSIS — F329 Major depressive disorder, single episode, unspecified: Secondary | ICD-10-CM | POA: Diagnosis not present

## 2018-07-07 DIAGNOSIS — K90829 Short bowel syndrome, unspecified: Secondary | ICD-10-CM

## 2018-07-07 DIAGNOSIS — K912 Postsurgical malabsorption, not elsewhere classified: Secondary | ICD-10-CM | POA: Diagnosis not present

## 2018-07-07 DIAGNOSIS — E785 Hyperlipidemia, unspecified: Secondary | ICD-10-CM

## 2018-07-07 DIAGNOSIS — Z9884 Bariatric surgery status: Secondary | ICD-10-CM | POA: Diagnosis not present

## 2018-07-07 HISTORY — DX: Polyosteoarthritis, unspecified: M15.9

## 2018-07-07 HISTORY — DX: Atherosclerotic heart disease of native coronary artery without angina pectoris: I25.10

## 2018-07-07 HISTORY — DX: Other hypertrophic osteoarthropathy, multiple sites: M89.49

## 2018-07-07 HISTORY — DX: Primary generalized (osteo)arthritis: M15.0

## 2018-07-07 HISTORY — DX: Major depressive disorder, single episode, in full remission: F32.5

## 2018-07-07 NOTE — Progress Notes (Signed)
Location:   [place] [facility] Room Number: [place] of Service:  SNF (31)   CODE STATUS: Full Code  Allergies  Allergen Reactions  . Cephalexin Hives and Rash    Denies Airway involvement  . Latex Rash  . Neomycin-Bacitracin Zn-Polymyx Rash  . Tape Rash    Latex Prefers paper tape    Chief Complaint  Patient presents with  . Hospitalization Follow-up    Hospital follow up    HPI:  She is a 63 year old woman who has been hospitalized from 06-30-18 through 07-06-18 for a left knee replacement. She is here for short term rehab with her goal to return back home. She denies any uncontrolled pain; no changes in appetite; no constipation; no anxiety and no insomnia. She will continue to be followed for her chronic illnesses including: hypertension; dyslipidemia; and depression.   Past Medical History:  Diagnosis Date  . Allergy   . ANEMIA, PERNICIOUS, HX OF 05/13/2007  . Anxiety   . Arthritis   . ASYMPTOMATIC POSTMENOPAUSAL STATUS 02/18/2008  . BACK PAIN, LUMBAR 11/19/2007  . Bowel incontinence   . Cataract   . Cellulitis of left lower leg 10/28/2013  . Cellulitis of leg, right 10/11/2010  . DEPRESSION, CHRONIC 10/06/2007  . Diabetes mellitus without complication ([place])   . DIABETES MELLITUS, WITH NEUROLOGICAL COMPLICATIONS 0/92/9574  . Diabetic neuropathy ([place])   . DYSLIPIDEMIA 05/13/2007  . Family history of colon cancer   . Fecal soiling 06/28/2014  . Frozen shoulder    Lt  . Full dentures   . GERD (gastroesophageal reflux disease)   . GOITER, MULTINODULAR 05/13/2007  . Headache   . Hx of colonic polyps 12/04/2010  . HYPERCHOLESTEROLEMIA 03/20/2010  . HYPERTENSION 03/20/2010  . Lower back pain   . NASH (nonalcoholic steatohepatitis)   . OA (osteoarthritis)   . OBSTRUCTIVE SLEEP APNEA 06/12/2007   uses CPAP.  Marland Kitchen PERIPHERAL NEUROPATHY 01/12/2009  . Peripheral vascular disease ([place])   . Pneumonia   . PSORIASIS 05/28/2010  . Renal insufficiency    stage 1 kd  . Sleep apnea   . UNSPECIFIED VENOUS INSUFFICIENCY 05/28/2010  . Wears glasses     Past Surgical History:  Procedure Laterality Date  . CATARACT EXTRACTION W/ INTRAOCULAR LENS  IMPLANT, BILATERAL    . COLONOSCOPY    . ELECTROCARDIOGRAM  04/16/2006  . EXAMINATION UNDER ANESTHESIA N/A 06/29/2014   Procedure: EXAM UNDER ANESTHESIA;  Surgeon: Janyth Contes, MD;  Location: [facility];  Service: Gynecology;  Laterality: N/A;  . Exercise myoview  01/24/2005  . FLEXIBLE SIGMOIDOSCOPY    . GASTRIC BYPASS  11/09/2013  . GASTRIC BYPASS    . JOINT REPLACEMENT    . KNEE ARTHROSCOPY  2003   right  . LAPAROSCOPY N/A 03/12/2016   Procedure: LAPAROSCOPIC ANASTOMOSIS OF INTESTINE (ENTEROENTEROSTOMY);  Surgeon: Ladora Daniel, MD;  Location: [facility];  Service: General;  Laterality: N/A;  . LEG SURGERY     metal and pins in lower left leg  . mrsa Right    arm  . MULTIPLE TOOTH EXTRACTIONS    . ROTATOR CUFF REPAIR  2008   right  . SHOULDER ARTHROSCOPY W/ ROTATOR CUFF REPAIR Right   . TONSILLECTOMY    . TOTAL KNEE ARTHROPLASTY Left 06/30/2018  . TOTAL KNEE ARTHROPLASTY Left 06/30/2018   Procedure: LEFT TOTAL KNEE ARTHROPLASTY;  Surgeon: Meredith Pel, MD;  Location: [facility];  Service: Orthopedics;  Laterality: Left;  Marland Kitchen VARICOSE VEIN SURGERY  [place]    Social History   Socioeconomic History  . Marital status: Married    Spouse name: Marya Amsler  . Number of children: 2  . Years of education: [employer]  . Highest education level: Not on file  Occupational History  . Occupation: Unemployed    Comment: disabled  Social Needs  . Financial resource strain: Not on file  . Food insecurity:    Worry: Not on file    Inability: Not on file  . Transportation needs:    Medical: Not on file    Non-medical: Not on file  Tobacco Use  . Smoking status: Former Smoker    Packs/day: 2.00    Years: 24.00    Pack years: 48.00    Types: Cigarettes    Start date: 06/24/1970    Last attempt  to quit: 08/06/1994    Years since quitting: 23.9  . Smokeless tobacco: Never Used  Substance and Sexual Activity  . Alcohol use: No    Alcohol/week: 0.0 standard drinks  . Drug use: No  . Sexual activity: Never  Lifestyle  . Physical activity:    Days per week: Not on file    Minutes per session: Not on file  . Stress: Not on file  Relationships  . Social connections:    Talks on phone: Not on file    Gets together: Not on file    Attends religious service: Not on file    Active member of club or organization: Not on file    Attends meetings of clubs or organizations: Not on file    Relationship status: Not on file  . Intimate partner violence:    Fear of current or ex partner: Not on file    Emotionally abused: Not on file    Physically abused: Not on file    Forced sexual activity: Not on file  Other Topics Concern  . Not on file  Social History Narrative   Married '91, husband has MS, wheelchair bound   [id number] x 2, 1 son '91 8.5lbs, 1 daughter '92 10lbs      Caffeine use- coffee 3-4 cups daily   Family History  Problem Relation Age of Onset  . Hyperlipidemia Father   . Hypertension Father   . Cirrhosis Father   . Colon cancer Father 56       d. 48  . Colon cancer Sister 42       d. 32  . Bipolar disorder Sister   . Aneurysm Mother        brain  . Cerebral aneurysm Mother   . Colon cancer Sister 42       d. 66  . Bipolar disorder Sister   . Cancer Paternal Aunt        NOS, ? colon  . Congestive Heart Failure Maternal Grandmother   . Drug abuse Neg Hx   . CAD Neg Hx   . Stomach cancer Neg Hx       VITAL SIGNS BP (!) 162/56   Pulse 60   Temp 98.2 F (36.8 C)   Resp 18   Ht 5' 7.5" (1.715 m)   Wt 225 lb 11.2 oz (102.4 kg)   SpO2 99%   BMI 34.83 kg/m    Outpatient Encounter Medications as of 07/07/2018  Medication Sig  . acetaminophen (TYLENOL) 325 MG tablet Take 1-2 tablets (325-650 mg total) by mouth every 6 (six) hours as needed for mild  pain (pain score 1-3 or temp >  100.5).  Marland Kitchen amLODipine (NORVASC) 10 MG tablet Take 1 tablet (10 mg total) by mouth daily.  Marland Kitchen apixaban (ELIQUIS) 5 MG TABS tablet Take 5 mg by mouth 2 (two) times daily.   Marland Kitchen atorvastatin (LIPITOR) 40 MG tablet Take 1 tablet (40 mg total) by mouth daily.  . Blood Glucose Monitoring Suppl (ACCU-CHEK AVIVA PLUS) w/Device KIT Use to check sugar daily  . Blood Glucose Monitoring Suppl (ACCU-CHEK NANO SMARTVIEW) w/Device KIT Use to check blood glucose four times a day  . docusate sodium (COLACE) 100 MG capsule Take 1 capsule (100 mg total) by mouth 2 (two) times daily.  Marland Kitchen gabapentin (NEURONTIN) 100 MG capsule TAKE 1 CAPSULE BY MOUTH TWICE A DAY  . glucose blood (ACCU-CHEK AVIVA PLUS) test strip Use to check sugar once daily in the morning  . Lancets (ACCU-CHEK SOFT TOUCH) lancets Use to check sugar daily  . lisinopril-hydrochlorothiazide (PRINZIDE,ZESTORETIC) 20-25 MG tablet Take 1 tablet by mouth daily.  . methocarbamol (ROBAXIN) 500 MG tablet Take 1 tablet (500 mg total) by mouth every 6 (six) hours as needed for muscle spasms.  . multivitamin-iron-minerals-folic acid (CENTRUM) chewable tablet Chew 4 tablets by mouth daily.  . NON FORMULARY Diet type:  NAS  . oxyCODONE (OXY IR/ROXICODONE) 5 MG immediate release tablet Take 1-2 tablets (5-10 mg total) by mouth every 4 (four) hours as needed for moderate pain (pain score 4-6).  Marland Kitchen senna (SENOKOT) 8.6 MG TABS tablet Take 1 tablet by mouth 2 (two) times daily.  . sertraline (ZOLOFT) 25 MG tablet Take 75 mg by mouth daily.  . Teduglutide, rDNA, (GATTEX) 5 MG KIT Inject 5 mg into the skin daily.  . [DISCONTINUED] gabapentin (NEURONTIN) 300 MG capsule TAKE 1 CAPSULE BY MOUTH THREE TIMES A DAY (Patient not taking: No sig reported)   No facility-administered encounter medications on file as of 07/07/2018.      SIGNIFICANT DIAGNOSTIC EXAMS  LABS REVIEWED TODAY:  04-29-18: tsh 1.710 hgb a1c 7.0  06-19-18: wbc 7.8; hgb 12.7;  hct 41.2; mcv 83.4 plt 219; glucose 139; bun 22; creat 1.33; k+ 4.4; na++ 140; ca 10.4   Review of Systems  Constitutional: Negative for malaise/fatigue.  Respiratory: Negative for cough and shortness of breath.   Cardiovascular: Negative for chest pain, palpitations and leg swelling.  Gastrointestinal: Negative for abdominal pain, constipation and heartburn.  Musculoskeletal: Negative for back pain, joint pain and myalgias.  Skin: Negative.   Neurological: Negative for dizziness.  Psychiatric/Behavioral: The patient is not nervous/anxious.     Physical Exam Constitutional:      General: She is not in acute distress.    Appearance: She is well-developed. She is not diaphoretic.  Neck:     Musculoskeletal: Neck supple.     Thyroid: No thyromegaly.  Cardiovascular:     Rate and Rhythm: Normal rate and regular rhythm.     Heart sounds: Normal heart sounds.  Pulmonary:     Effort: Pulmonary effort is normal. No respiratory distress.     Breath sounds: Normal breath sounds.  Abdominal:     General: Bowel sounds are normal. There is no distension.     Palpations: Abdomen is soft.     Tenderness: There is no abdominal tenderness.  Musculoskeletal:     Right lower leg: No edema.     Left lower leg: No edema.     Comments: Is able to move all extremities  Status post left knee replacement   Lymphadenopathy:     Cervical: No cervical  adenopathy.  Skin:    General: Skin is warm and dry.     Comments: Incision line without signs of infection present   Neurological:     Mental Status: She is alert and oriented to person, place, and time.  Psychiatric:        Mood and Affect: Mood normal.     ASSESSMENT/ PLAN:  TODAY:   1. Hypertension associated with diabetes: is stable b/p 162/56: will continue norvasc 10 mg daily lisinopril hct 20/25 mg daily   2. Dyslipidemia associated with type 2 diabetes mellitus: is stable will continue lipitor 40 mg daily   3.  Short bowel syndrome  is status post gastric bypass: is stable will continue teduglutide 5 mg daily will continue colace twice daily and senna twice daily   4. Type 2 diabetes controlled with neuropathy: is stable hgb a1c 7.0; will not make changes will monitor is on statin and ace  5. Peripheral autonomic neuropathy due to diabetes mellitus type 2: is stable will continue neurontin 100 mg twice daily   6. Chronic depression: is stable will continue zoloft 75 mg daily   7. Primary osteoarthritis left knee is status post replacement: is stable will continue therapy as directed and will follow up with orthopedics; is on elqius 5 mg twice daily; will continue robaxin 500 mg every 6 hours as needed and oxycodone 5 or 10 mg every 4 hours as needed          MD is aware of resident's narcotic use and is in agreement with current plan of care. We will attempt to wean resident as apropriate   Ok Edwards NP [facility]  Contact [phone] Monday through Friday 8am- 5pm  After hours call [phone]

## 2018-07-11 DIAGNOSIS — E785 Hyperlipidemia, unspecified: Secondary | ICD-10-CM

## 2018-07-11 DIAGNOSIS — I152 Hypertension secondary to endocrine disorders: Secondary | ICD-10-CM

## 2018-07-11 DIAGNOSIS — Z96652 Presence of left artificial knee joint: Secondary | ICD-10-CM

## 2018-07-11 DIAGNOSIS — E1169 Type 2 diabetes mellitus with other specified complication: Secondary | ICD-10-CM

## 2018-07-11 DIAGNOSIS — E1159 Type 2 diabetes mellitus with other circulatory complications: Secondary | ICD-10-CM

## 2018-07-11 DIAGNOSIS — K90829 Short bowel syndrome, unspecified: Secondary | ICD-10-CM

## 2018-07-11 DIAGNOSIS — K912 Postsurgical malabsorption, not elsewhere classified: Secondary | ICD-10-CM

## 2018-07-11 DIAGNOSIS — E1143 Type 2 diabetes mellitus with diabetic autonomic (poly)neuropathy: Secondary | ICD-10-CM

## 2018-07-11 DIAGNOSIS — I10 Essential (primary) hypertension: Secondary | ICD-10-CM

## 2018-07-11 HISTORY — DX: Type 2 diabetes mellitus with other circulatory complications: E11.59

## 2018-07-11 HISTORY — DX: Type 2 diabetes mellitus with diabetic autonomic (poly)neuropathy: E11.43

## 2018-07-11 HISTORY — DX: Hypertension secondary to endocrine disorders: I15.2

## 2018-07-11 HISTORY — DX: Postsurgical malabsorption, not elsewhere classified: K91.2

## 2018-07-11 HISTORY — DX: Presence of left artificial knee joint: Z96.652

## 2018-07-11 HISTORY — DX: Type 2 diabetes mellitus with other specified complication: E11.69

## 2018-07-11 HISTORY — DX: Type 2 diabetes mellitus with other specified complication: E78.5

## 2018-07-11 HISTORY — DX: Short bowel syndrome, unspecified: K90.829

## 2018-07-13 ENCOUNTER — Ambulatory Visit (INDEPENDENT_AMBULATORY_CARE_PROVIDER_SITE_OTHER): Payer: Medicare HMO

## 2018-07-13 ENCOUNTER — Encounter (INDEPENDENT_AMBULATORY_CARE_PROVIDER_SITE_OTHER): Payer: Self-pay | Admitting: Orthopedic Surgery

## 2018-07-13 ENCOUNTER — Ambulatory Visit (INDEPENDENT_AMBULATORY_CARE_PROVIDER_SITE_OTHER): Payer: Medicare HMO | Admitting: Orthopedic Surgery

## 2018-07-13 ENCOUNTER — Other Ambulatory Visit: Payer: Self-pay | Admitting: Adult Health

## 2018-07-13 VITALS — Ht 68.0 in | Wt 225.0 lb

## 2018-07-13 DIAGNOSIS — Z96652 Presence of left artificial knee joint: Secondary | ICD-10-CM | POA: Diagnosis not present

## 2018-07-13 MED ORDER — OXYCODONE HCL 5 MG PO TABS: 5.0000 mg | ORAL_TABLET | ORAL | 0 refills | Status: DC | PRN

## 2018-07-13 NOTE — Progress Notes (Signed)
Post-Op Visit Note   Patient: [name]           Date of Birth: [birth year]           MRN: [id number] Visit Date: 07/13/2018 PCP: Steve Rattler, DO   Assessment & Plan:  Chief Complaint:  Chief Complaint  Patient presents with  . Left Knee - Routine Post Op    06/30/2018 Left TKA   Visit Diagnoses:  1. Status post total left knee replacement     Plan: [name] is a patient is now about 2 weeks out left total knee replacement.  On exam she has good heel-to-toe gait using a walker.  Incision looks intact.  I like her to continue using CPM machine at the nursing home.  I think she will be there perhaps another week.  Ace wrap applied to the left knee today.  Continue with strengthening and range of motion exercises.  Follow-up with me in 2 weeks just for clinical recheck on range of motion. Follow-Up Instructions: Return in about 2 weeks (around 07/27/2018).   Orders:  Orders Placed This Encounter  Procedures  . XR Knee 1-2 Views Left   No orders of the defined types were placed in this encounter.   Imaging: Xr Knee 1-2 Views Left  Result Date: 07/13/2018 AP lateral left knee demonstrates total knee prosthesis in good position alignment.  No complicating features.  Significant arthritis noted in the right knee lateral compartment.   PMFS History: Patient Active Problem List   Diagnosis Date Noted  . Hypertension associated with diabetes ([place]) 07/11/2018  . Dyslipidemia associated with type 2 diabetes mellitus ([place]) 07/11/2018  . Short bowel syndrome 07/11/2018  . Peripheral autonomic neuropathy due to diabetes mellitus ([place]) 07/11/2018  . Status post left knee replacement 07/11/2018  . Arthritis of knee 06/30/2018  . Mild cognitive impairment 04/30/2018  . Venous stasis dermatitis of right lower extremity 01/02/2018  . Genetic testing 11/24/2017  . Family history of colon cancer   . B12 deficiency 12/13/2016  . Malabsorption 03/12/2016  . Tubular adenoma of colon  03/09/2016  . Vitamin D deficiency 03/09/2016  . Rectovaginal fistula 05/25/2014  . S/P bariatric surgery-duodenal switch with sleeve gastrectomy 12/04/2013  . Nonobstructive CAD  08/26/2013  . Iron deficiency anemia 06/19/2012  . Low back pain 03/05/2012  . Primary osteoarthritis of left knee 04/23/2011  . UNSPECIFIED VENOUS INSUFFICIENCY 05/28/2010  . PSORIASIS 05/28/2010  . HYPERCHOLESTEROLEMIA 03/20/2010  . Essential hypertension 03/20/2010  . Chronic dermatitis of hands 05/02/2009  . Peripheral neuropathy 01/12/2009  . Type 2 diabetes, controlled, with neuropathy ([place]) 02/18/2008  . Chronic depression 10/06/2007  . OBSTRUCTIVE SLEEP APNEA 06/12/2007  . GOITER, MULTINODULAR 05/13/2007   Past Medical History:  Diagnosis Date  . Allergy   . ANEMIA, PERNICIOUS, HX OF 05/13/2007  . Anxiety   . Arthritis   . ASYMPTOMATIC POSTMENOPAUSAL STATUS 02/18/2008  . BACK PAIN, LUMBAR 11/19/2007  . Bowel incontinence   . Cataract   . Cellulitis of left lower leg 10/28/2013  . Cellulitis of leg, right 10/11/2010  . DEPRESSION, CHRONIC 10/06/2007  . Diabetes mellitus without complication ([place])   . DIABETES MELLITUS, WITH NEUROLOGICAL COMPLICATIONS 6/38/9373  . Diabetic neuropathy ([place])   . DYSLIPIDEMIA 05/13/2007  . Family history of colon cancer   . Fecal soiling 06/28/2014  . Frozen shoulder    Lt  . Full dentures   . GERD (gastroesophageal reflux disease)   . GOITER, MULTINODULAR 05/13/2007  . Headache   .  Hx of colonic polyps 12/04/2010  . HYPERCHOLESTEROLEMIA 03/20/2010  . HYPERTENSION 03/20/2010  . Lower back pain   . NASH (nonalcoholic steatohepatitis)   . OA (osteoarthritis)   . OBSTRUCTIVE SLEEP APNEA 06/12/2007   uses CPAP.  Marland Kitchen PERIPHERAL NEUROPATHY 01/12/2009  . Peripheral vascular disease ([place])   . Pneumonia   . PSORIASIS 05/28/2010  . Renal insufficiency    stage 1 kd  . Sleep apnea   . UNSPECIFIED VENOUS INSUFFICIENCY 05/28/2010  . Wears glasses     Family History    Problem Relation Age of Onset  . Hyperlipidemia Father   . Hypertension Father   . Cirrhosis Father   . Colon cancer Father 25       d. 36  . Colon cancer Sister 75       d. 60  . Bipolar disorder Sister   . Aneurysm Mother        brain  . Cerebral aneurysm Mother   . Colon cancer Sister 25       d. 108  . Bipolar disorder Sister   . Cancer Paternal Aunt        NOS, ? colon  . Congestive Heart Failure Maternal Grandmother   . Drug abuse Neg Hx   . CAD Neg Hx   . Stomach cancer Neg Hx     Past Surgical History:  Procedure Laterality Date  . CATARACT EXTRACTION W/ INTRAOCULAR LENS  IMPLANT, BILATERAL    . COLONOSCOPY    . ELECTROCARDIOGRAM  04/16/2006  . EXAMINATION UNDER ANESTHESIA N/A 06/29/2014   Procedure: EXAM UNDER ANESTHESIA;  Surgeon: Janyth Contes, MD;  Location: [facility];  Service: Gynecology;  Laterality: N/A;  . Exercise myoview  01/24/2005  . FLEXIBLE SIGMOIDOSCOPY    . GASTRIC BYPASS  11/09/2013  . GASTRIC BYPASS    . JOINT REPLACEMENT    . KNEE ARTHROSCOPY  2003   right  . LAPAROSCOPY N/A 03/12/2016   Procedure: LAPAROSCOPIC ANASTOMOSIS OF INTESTINE (ENTEROENTEROSTOMY);  Surgeon: Ladora Daniel, MD;  Location: [facility];  Service: General;  Laterality: N/A;  . LEG SURGERY     metal and pins in lower left leg  . mrsa Right    arm  . MULTIPLE TOOTH EXTRACTIONS    . ROTATOR CUFF REPAIR  2008   right  . SHOULDER ARTHROSCOPY W/ ROTATOR CUFF REPAIR Right   . TONSILLECTOMY    . TOTAL KNEE ARTHROPLASTY Left 06/30/2018  . TOTAL KNEE ARTHROPLASTY Left 06/30/2018   Procedure: LEFT TOTAL KNEE ARTHROPLASTY;  Surgeon: Meredith Pel, MD;  Location: [facility];  Service: Orthopedics;  Laterality: Left;  Marland Kitchen VARICOSE VEIN SURGERY     Remotef   Social History   Occupational History  . Occupation: Unemployed    Comment: disabled  Tobacco Use  . Smoking status: Former Smoker    Packs/day: 2.00    Years: 24.00    Pack years: 48.00    Types: Cigarettes    Start  date: 06/24/1970    Last attempt to quit: 08/06/1994    Years since quitting: 23.9  . Smokeless tobacco: Never Used  Substance and Sexual Activity  . Alcohol use: No    Alcohol/week: 0.0 standard drinks  . Drug use: No  . Sexual activity: Never

## 2018-07-14 ENCOUNTER — Encounter: Payer: Self-pay | Admitting: Adult Health

## 2018-07-14 ENCOUNTER — Non-Acute Institutional Stay (SKILLED_NURSING_FACILITY): Payer: Medicare HMO | Admitting: Adult Health

## 2018-07-14 DIAGNOSIS — I10 Essential (primary) hypertension: Secondary | ICD-10-CM

## 2018-07-14 DIAGNOSIS — E1159 Type 2 diabetes mellitus with other circulatory complications: Secondary | ICD-10-CM | POA: Diagnosis not present

## 2018-07-14 DIAGNOSIS — E1169 Type 2 diabetes mellitus with other specified complication: Secondary | ICD-10-CM

## 2018-07-14 DIAGNOSIS — E785 Hyperlipidemia, unspecified: Secondary | ICD-10-CM | POA: Diagnosis not present

## 2018-07-14 DIAGNOSIS — K912 Postsurgical malabsorption, not elsewhere classified: Secondary | ICD-10-CM | POA: Diagnosis not present

## 2018-07-14 NOTE — Progress Notes (Signed)
[facility] Room Number: [place] of Service:  SNF (31)   CODE STATUS: Full Code  Allergies  Allergen Reactions  . Cephalexin Hives and Rash    Denies Airway involvement  . Latex Rash  . Neomycin-Bacitracin Zn-Polymyx Rash  . Tape Rash    Latex Prefers paper tape    Chief Complaint  Patient presents with  . Medical Management of Chronic Issues    Hypertension associated with diabetes; short bowel syndrome; dyslipidemia associated with type 2 diabetes mellitus     HPI:  She is a 63 year old short term resident of this facility being seen for the management of her chronic illnesses: hypertension; short bowel syndrome; dyslipidemia. She denies any uncontrolled left knee pain; no changes in her appetite; no constipation or diarrhea. She is here for short term rehab following a left knee replacement.   Past Medical History:  Diagnosis Date  . Allergy   . ANEMIA, PERNICIOUS, HX OF 05/13/2007  . Anxiety   . Arthritis   . ASYMPTOMATIC POSTMENOPAUSAL STATUS 02/18/2008  . BACK PAIN, LUMBAR 11/19/2007  . Bowel incontinence   . Cataract   . Cellulitis of left lower leg 10/28/2013  . Cellulitis of leg, right 10/11/2010  . DEPRESSION, CHRONIC 10/06/2007  . Diabetes mellitus without complication ([place])   . DIABETES MELLITUS, WITH NEUROLOGICAL COMPLICATIONS 09/24/4740  . Diabetic neuropathy ([place])   . DYSLIPIDEMIA 05/13/2007  . Family history of colon cancer   . Fecal soiling 06/28/2014  . Frozen shoulder    Lt  . Full dentures   . GERD (gastroesophageal reflux disease)   . GOITER, MULTINODULAR 05/13/2007  . Headache   . Hx of colonic polyps 12/04/2010  . HYPERCHOLESTEROLEMIA 03/20/2010  . HYPERTENSION 03/20/2010  . Lower back pain   . NASH (nonalcoholic steatohepatitis)   . OA (osteoarthritis)   . OBSTRUCTIVE SLEEP APNEA 06/12/2007   uses CPAP.  Marland Kitchen PERIPHERAL NEUROPATHY 01/12/2009  . Peripheral vascular disease ([place])   . Pneumonia   .  PSORIASIS 05/28/2010  . Renal insufficiency    stage 1 kd  . Sleep apnea   . UNSPECIFIED VENOUS INSUFFICIENCY 05/28/2010  . Wears glasses     Past Surgical History:  Procedure Laterality Date  . CATARACT EXTRACTION W/ INTRAOCULAR LENS  IMPLANT, BILATERAL    . COLONOSCOPY    . ELECTROCARDIOGRAM  04/16/2006  . EXAMINATION UNDER ANESTHESIA N/A 06/29/2014   Procedure: EXAM UNDER ANESTHESIA;  Surgeon: Janyth Contes, MD;  Location: [facility];  Service: Gynecology;  Laterality: N/A;  . Exercise myoview  01/24/2005  . FLEXIBLE SIGMOIDOSCOPY    . GASTRIC BYPASS  11/09/2013  . GASTRIC BYPASS    . JOINT REPLACEMENT    . KNEE ARTHROSCOPY  2003   right  . LAPAROSCOPY N/A 03/12/2016   Procedure: LAPAROSCOPIC ANASTOMOSIS OF INTESTINE (ENTEROENTEROSTOMY);  Surgeon: Ladora Daniel, MD;  Location: [facility];  Service: General;  Laterality: N/A;  . LEG SURGERY     metal and pins in lower left leg  . mrsa Right    arm  . MULTIPLE TOOTH EXTRACTIONS    . ROTATOR CUFF REPAIR  2008   right  . SHOULDER ARTHROSCOPY W/ ROTATOR CUFF REPAIR Right   . TONSILLECTOMY    . TOTAL KNEE ARTHROPLASTY Left 06/30/2018  . TOTAL KNEE ARTHROPLASTY Left 06/30/2018   Procedure: LEFT TOTAL KNEE ARTHROPLASTY;  Surgeon: Meredith Pel, MD;  Location: [facility];  Service: Orthopedics;  Laterality: Left;  .  VARICOSE VEIN SURGERY     Remotef    Social History   Socioeconomic History  . Marital status: Married    Spouse name: Marya Amsler  . Number of children: 2  . Years of education: [employer]  . Highest education level: Not on file  Occupational History  . Occupation: Unemployed    Comment: disabled  Social Needs  . Financial resource strain: Not on file  . Food insecurity:    Worry: Not on file    Inability: Not on file  . Transportation needs:    Medical: Not on file    Non-medical: Not on file  Tobacco Use  . Smoking status: Former Smoker    Packs/day: 2.00    Years: 24.00    Pack years: 48.00    Types:  Cigarettes    Start date: 06/24/1970    Last attempt to quit: 08/06/1994    Years since quitting: 23.9  . Smokeless tobacco: Never Used  Substance and Sexual Activity  . Alcohol use: No    Alcohol/week: 0.0 standard drinks  . Drug use: No  . Sexual activity: Never  Lifestyle  . Physical activity:    Days per week: Not on file    Minutes per session: Not on file  . Stress: Not on file  Relationships  . Social connections:    Talks on phone: Not on file    Gets together: Not on file    Attends religious service: Not on file    Active member of club or organization: Not on file    Attends meetings of clubs or organizations: Not on file    Relationship status: Not on file  . Intimate partner violence:    Fear of current or ex partner: Not on file    Emotionally abused: Not on file    Physically abused: Not on file    Forced sexual activity: Not on file  Other Topics Concern  . Not on file  Social History Narrative   Married '91, husband has MS, wheelchair bound   [id number] x 2, 1 son '91 8.5lbs, 1 daughter '92 10lbs      Caffeine use- coffee 3-4 cups daily   Family History  Problem Relation Age of Onset  . Hyperlipidemia Father   . Hypertension Father   . Cirrhosis Father   . Colon cancer Father 65       d. 69  . Colon cancer Sister 62       d. 26  . Bipolar disorder Sister   . Aneurysm Mother        brain  . Cerebral aneurysm Mother   . Colon cancer Sister 71       d. 59  . Bipolar disorder Sister   . Cancer Paternal Aunt        NOS, ? colon  . Congestive Heart Failure Maternal Grandmother   . Drug abuse Neg Hx   . CAD Neg Hx   . Stomach cancer Neg Hx       VITAL SIGNS BP (!) 149/45   Pulse (!) 55   Temp 98.1 F (36.7 C)   Resp 18   Ht 5' 8"  (1.727 m)   Wt 225 lb 11.2 oz (102.4 kg)   SpO2 98%   BMI 34.32 kg/m   Outpatient Encounter Medications as of 07/14/2018  Medication Sig  . acetaminophen (TYLENOL) 325 MG tablet Take 1-2 tablets (325-650 mg  total) by mouth every 6 (six) hours as needed for  mild pain (pain score 1-3 or temp > 100.5).  Marland Kitchen amLODipine (NORVASC) 10 MG tablet Take 1 tablet (10 mg total) by mouth daily.  Marland Kitchen apixaban (ELIQUIS) 5 MG TABS tablet Take 5 mg by mouth 2 (two) times daily.   Marland Kitchen atorvastatin (LIPITOR) 40 MG tablet Take 1 tablet (40 mg total) by mouth daily.  . Blood Glucose Monitoring Suppl (ACCU-CHEK AVIVA PLUS) w/Device KIT Use to check sugar daily  . Blood Glucose Monitoring Suppl (ACCU-CHEK NANO SMARTVIEW) w/Device KIT Use to check blood glucose four times a day  . docusate sodium (COLACE) 100 MG capsule Take 1 capsule (100 mg total) by mouth 2 (two) times daily.  Marland Kitchen gabapentin (NEURONTIN) 100 MG capsule TAKE 1 CAPSULE BY MOUTH TWICE A DAY  . glucose blood (ACCU-CHEK AVIVA PLUS) test strip Use to check sugar once daily in the morning  . Lancets (ACCU-CHEK SOFT TOUCH) lancets Use to check sugar daily  . lisinopril-hydrochlorothiazide (PRINZIDE,ZESTORETIC) 20-25 MG tablet Take 1 tablet by mouth daily.  . methocarbamol (ROBAXIN) 500 MG tablet Take 1 tablet (500 mg total) by mouth every 6 (six) hours as needed for muscle spasms.  . multivitamin-iron-minerals-folic acid (CENTRUM) chewable tablet Chew 4 tablets by mouth daily.  . NON FORMULARY Diet type:  NAS  . oxyCODONE (OXY IR/ROXICODONE) 5 MG immediate release tablet Take 1-2 tablets (5-10 mg total) by mouth every 4 (four) hours as needed for moderate pain (pain score 4-6).  Marland Kitchen senna (SENOKOT) 8.6 MG TABS tablet Take 1 tablet by mouth 2 (two) times daily.  . sertraline (ZOLOFT) 25 MG tablet Take 75 mg by mouth daily.  . Teduglutide, rDNA, (GATTEX) 5 MG KIT Inject 5 mg into the skin daily.   No facility-administered encounter medications on file as of 07/14/2018.      SIGNIFICANT DIAGNOSTIC EXAMS  LABS REVIEWED PREVIOUS:   04-29-18: tsh 1.710 hgb a1c 7.0  06-19-18: wbc 7.8; hgb 12.7; hct 41.2; mcv 83.4 plt 219; glucose 139; bun 22; creat 1.33; k+ 4.4; na++ 140;  ca 10.4  NO NEW LABS.    Review of Systems  Constitutional: Negative for malaise/fatigue.  Respiratory: Negative for cough and shortness of breath.   Cardiovascular: Negative for chest pain, palpitations and leg swelling.  Gastrointestinal: Negative for abdominal pain, constipation and heartburn.  Musculoskeletal: Negative for back pain, joint pain and myalgias.  Skin: Negative.   Neurological: Negative for dizziness.  Psychiatric/Behavioral: The patient is not nervous/anxious.     Physical Exam Constitutional:      General: She is not in acute distress.    Appearance: She is well-developed. She is not diaphoretic.  Neck:     Musculoskeletal: Neck supple.     Thyroid: No thyromegaly.  Cardiovascular:     Rate and Rhythm: Normal rate and regular rhythm.     Pulses: Normal pulses.     Heart sounds: Normal heart sounds.  Pulmonary:     Effort: Pulmonary effort is normal. No respiratory distress.     Breath sounds: Normal breath sounds.  Abdominal:     General: Bowel sounds are normal. There is no distension.     Palpations: Abdomen is soft.     Tenderness: There is no abdominal tenderness.  Musculoskeletal:     Right lower leg: No edema.     Left lower leg: No edema.     Comments: Is able to move all extremities  Status post left knee replacement    Lymphadenopathy:     Cervical: No cervical adenopathy.  Skin:    General: Skin is warm and dry.     Comments: Incision line without signs of infection present   Neurological:     Mental Status: She is alert and oriented to person, place, and time.  Psychiatric:        Mood and Affect: Mood normal.     /  ASSESSMENT/ PLAN:  TODAY:   1. Hypertension associated with diabetes: is stable b/p 149/45: will continue norvasc 10 mg daily lisinopril hct 20/25 mg daily   2. Dyslipidemia associated with type 2 diabetes mellitus: is stable will continue lipitor 40 mg daily   3.  Short bowel syndrome is status post gastric  bypass: is stable will continue teduglutide 5 mg daily will continue colace twice daily and senna twice daily   PREVIOUS  4. Type 2 diabetes controlled with neuropathy: is stable hgb a1c 7.0; will not make changes will monitor is on statin and ace  5. Peripheral autonomic neuropathy due to diabetes mellitus type 2: is stable will continue neurontin 100 mg twice daily   6. Chronic depression: is stable will continue zoloft 75 mg daily   7. Primary osteoarthritis left knee is status post replacement: is stable will continue therapy as directed and will follow up with orthopedics; is on elqius 5 mg twice daily; will continue robaxin 500 mg every 6 hours as needed and oxycodone 5 or 10 mg every 4 hours as needed   MD is aware of resident's narcotic use and is in agreement with current plan of care. We will attempt to wean resident as apropriate   Ok Edwards NP [facility]  Contact [phone] Monday through Friday 8am- 5pm  After hours call [phone]

## 2018-07-17 ENCOUNTER — Other Ambulatory Visit: Payer: Self-pay | Admitting: Adult Health

## 2018-07-17 ENCOUNTER — Encounter: Payer: Self-pay | Admitting: Adult Health

## 2018-07-17 ENCOUNTER — Non-Acute Institutional Stay (SKILLED_NURSING_FACILITY): Payer: Medicare HMO | Admitting: Adult Health

## 2018-07-17 ENCOUNTER — Other Ambulatory Visit: Payer: Self-pay | Admitting: Family Medicine

## 2018-07-17 DIAGNOSIS — I10 Essential (primary) hypertension: Secondary | ICD-10-CM | POA: Diagnosis not present

## 2018-07-17 DIAGNOSIS — E7800 Pure hypercholesterolemia, unspecified: Secondary | ICD-10-CM

## 2018-07-17 DIAGNOSIS — M1712 Unilateral primary osteoarthritis, left knee: Secondary | ICD-10-CM | POA: Diagnosis not present

## 2018-07-17 DIAGNOSIS — E1159 Type 2 diabetes mellitus with other circulatory complications: Secondary | ICD-10-CM | POA: Diagnosis not present

## 2018-07-17 DIAGNOSIS — Z96652 Presence of left artificial knee joint: Secondary | ICD-10-CM | POA: Diagnosis not present

## 2018-07-17 MED ORDER — AMLODIPINE BESYLATE 10 MG PO TABS: 10.0000 mg | ORAL_TABLET | Freq: Every day | ORAL | 0 refills | Status: DC

## 2018-07-17 MED ORDER — ATORVASTATIN CALCIUM 40 MG PO TABS: 40.0000 mg | ORAL_TABLET | Freq: Every day | ORAL | 0 refills | Status: DC

## 2018-07-17 MED ORDER — METHOCARBAMOL 500 MG PO TABS: 500.0000 mg | ORAL_TABLET | Freq: Four times a day (QID) | ORAL | 0 refills | Status: DC | PRN

## 2018-07-17 MED ORDER — APIXABAN 5 MG PO TABS: 5.0000 mg | ORAL_TABLET | Freq: Two times a day (BID) | ORAL | 0 refills | Status: DC

## 2018-07-17 MED ORDER — LISINOPRIL-HYDROCHLOROTHIAZIDE 20-25 MG PO TABS: 1.0000 | ORAL_TABLET | Freq: Every day | ORAL | 0 refills | Status: DC

## 2018-07-17 MED ORDER — GLUCOSE BLOOD VI STRP: ORAL_STRIP | 0 refills | Status: DC

## 2018-07-17 MED ORDER — GABAPENTIN 100 MG PO CAPS: 100.0000 mg | ORAL_CAPSULE | Freq: Two times a day (BID) | ORAL | 0 refills | Status: DC

## 2018-07-17 MED ORDER — SERTRALINE HCL 25 MG PO TABS: 75.0000 mg | ORAL_TABLET | Freq: Every day | ORAL | 0 refills | Status: DC

## 2018-07-17 MED ORDER — ACCU-CHEK SOFT TOUCH LANCETS MISC: 0 refills | Status: DC

## 2018-07-17 MED ORDER — ACCU-CHEK AVIVA PLUS W/DEVICE KIT: PACK | 0 refills | Status: DC

## 2018-07-17 MED ORDER — OXYCODONE HCL 5 MG PO TABS: 5.0000 mg | ORAL_TABLET | ORAL | 0 refills | Status: DC | PRN

## 2018-07-17 NOTE — Progress Notes (Signed)
Location:   [place] [facility] [place] Room Number: [id number] Place of Service:  SNF (31)    CODE STATUS: FULL  Allergies  Allergen Reactions  . Cephalexin Hives and Rash    Denies Airway involvement  . Latex Rash  . Neomycin-Bacitracin Zn-Polymyx Rash  . Tape Rash    Latex Prefers paper tape    Chief Complaint  Patient presents with  . Discharge Note    Discharge from Facility on 07/19/2018    HPI:  She is being discharged to home with home health for pt/ot. She will not need any dme. She will need her prescriptions written and will need to follow up with her medical provider. She had been hospitalized for a left knee replacement. She was admitted to this facility for short term rehab; she has completed her skilled nursing rehab and is now ready for discharge to home.    Past Medical History:  Diagnosis Date  . Allergy   . ANEMIA, PERNICIOUS, HX OF 05/13/2007  . Anxiety   . Arthritis   . ASYMPTOMATIC POSTMENOPAUSAL STATUS 02/18/2008  . BACK PAIN, LUMBAR 11/19/2007  . Bowel incontinence   . Cataract   . Cellulitis of left lower leg 10/28/2013  . Cellulitis of leg, right 10/11/2010  . DEPRESSION, CHRONIC 10/06/2007  . Diabetes mellitus without complication ([place])   . DIABETES MELLITUS, WITH NEUROLOGICAL COMPLICATIONS 1/93/7902  . Diabetic neuropathy ([place])   . DYSLIPIDEMIA 05/13/2007  . Family history of colon cancer   . Fecal soiling 06/28/2014  . Frozen shoulder    Lt  . Full dentures   . GERD (gastroesophageal reflux disease)   . GOITER, MULTINODULAR 05/13/2007  . Headache   . Hx of colonic polyps 12/04/2010  . HYPERCHOLESTEROLEMIA 03/20/2010  . HYPERTENSION 03/20/2010  . Lower back pain   . NASH (nonalcoholic steatohepatitis)   . OA (osteoarthritis)   . OBSTRUCTIVE SLEEP APNEA 06/12/2007   uses CPAP.  Marland Kitchen PERIPHERAL NEUROPATHY 01/12/2009  . Peripheral vascular disease ([place])   . Pneumonia   . PSORIASIS 05/28/2010  . Renal insufficiency    stage 1 kd  .  Sleep apnea   . UNSPECIFIED VENOUS INSUFFICIENCY 05/28/2010  . Wears glasses     Past Surgical History:  Procedure Laterality Date  . CATARACT EXTRACTION W/ INTRAOCULAR LENS  IMPLANT, BILATERAL    . COLONOSCOPY    . ELECTROCARDIOGRAM  04/16/2006  . EXAMINATION UNDER ANESTHESIA N/A 06/29/2014   Procedure: EXAM UNDER ANESTHESIA;  Surgeon: Janyth Contes, MD;  Location: [facility];  Service: Gynecology;  Laterality: N/A;  . Exercise myoview  01/24/2005  . FLEXIBLE SIGMOIDOSCOPY    . GASTRIC BYPASS  11/09/2013  . GASTRIC BYPASS    . JOINT REPLACEMENT    . KNEE ARTHROSCOPY  2003   right  . LAPAROSCOPY N/A 03/12/2016   Procedure: LAPAROSCOPIC ANASTOMOSIS OF INTESTINE (ENTEROENTEROSTOMY);  Surgeon: Ladora Daniel, MD;  Location: [facility];  Service: General;  Laterality: N/A;  . LEG SURGERY     metal and pins in lower left leg  . mrsa Right    arm  . MULTIPLE TOOTH EXTRACTIONS    . ROTATOR CUFF REPAIR  2008   right  . SHOULDER ARTHROSCOPY W/ ROTATOR CUFF REPAIR Right   . TONSILLECTOMY    . TOTAL KNEE ARTHROPLASTY Left 06/30/2018  . TOTAL KNEE ARTHROPLASTY Left 06/30/2018   Procedure: LEFT TOTAL KNEE ARTHROPLASTY;  Surgeon: Meredith Pel, MD;  Location: [facility];  Service: Orthopedics;  Laterality: Left;  .  VARICOSE VEIN SURGERY     Remotef    Social History   Socioeconomic History  . Marital status: Married    Spouse name: Marya Amsler  . Number of children: 2  . Years of education: [employer]  . Highest education level: Not on file  Occupational History  . Occupation: Unemployed    Comment: disabled  Social Needs  . Financial resource strain: Not on file  . Food insecurity:    Worry: Not on file    Inability: Not on file  . Transportation needs:    Medical: Not on file    Non-medical: Not on file  Tobacco Use  . Smoking status: Former Smoker    Packs/day: 2.00    Years: 24.00    Pack years: 48.00    Types: Cigarettes    Start date: 06/24/1970    Last attempt to quit:  08/06/1994    Years since quitting: 23.9  . Smokeless tobacco: Never Used  Substance and Sexual Activity  . Alcohol use: No    Alcohol/week: 0.0 standard drinks  . Drug use: No  . Sexual activity: Never  Lifestyle  . Physical activity:    Days per week: Not on file    Minutes per session: Not on file  . Stress: Not on file  Relationships  . Social connections:    Talks on phone: Not on file    Gets together: Not on file    Attends religious service: Not on file    Active member of club or organization: Not on file    Attends meetings of clubs or organizations: Not on file    Relationship status: Not on file  . Intimate partner violence:    Fear of current or ex partner: Not on file    Emotionally abused: Not on file    Physically abused: Not on file    Forced sexual activity: Not on file  Other Topics Concern  . Not on file  Social History Narrative   Married '91, husband has MS, wheelchair bound   [id number] x 2, 1 son '91 8.5lbs, 1 daughter '92 10lbs      Caffeine use- coffee 3-4 cups daily   Family History  Problem Relation Age of Onset  . Hyperlipidemia Father   . Hypertension Father   . Cirrhosis Father   . Colon cancer Father 66       d. 40  . Colon cancer Sister 45       d. 66  . Bipolar disorder Sister   . Aneurysm Mother        brain  . Cerebral aneurysm Mother   . Colon cancer Sister 51       d. 24  . Bipolar disorder Sister   . Cancer Paternal Aunt        NOS, ? colon  . Congestive Heart Failure Maternal Grandmother   . Drug abuse Neg Hx   . CAD Neg Hx   . Stomach cancer Neg Hx     VITAL SIGNS BP (!) 132/43   Pulse 64   Temp 98.1 F (36.7 C) (Oral)   Resp 18   Ht 5' 8"  (1.727 m)   Wt 225 lb 11.2 oz (102.4 kg)   SpO2 100%   BMI 34.32 kg/m   Patient's Medications  New Prescriptions   No medications on file  Previous Medications   ACETAMINOPHEN (TYLENOL) 325 MG TABLET    Take 1-2 tablets (325-650 mg total) by mouth every 6 (  six) hours  as needed for mild pain (pain score 1-3 or temp > 100.5).   AMLODIPINE (NORVASC) 10 MG TABLET    Take 1 tablet (10 mg total) by mouth daily.   APIXABAN (ELIQUIS) 5 MG TABS TABLET    Take 5 mg by mouth 2 (two) times daily.    ATORVASTATIN (LIPITOR) 40 MG TABLET    Take 1 tablet (40 mg total) by mouth daily.   BLOOD GLUCOSE MONITORING SUPPL (ACCU-CHEK AVIVA PLUS) W/DEVICE KIT    Use to check sugar daily   BLOOD GLUCOSE MONITORING SUPPL (ACCU-CHEK NANO SMARTVIEW) W/DEVICE KIT    Use to check blood glucose four times a day   DOCUSATE SODIUM (COLACE) 100 MG CAPSULE    Take 1 capsule (100 mg total) by mouth 2 (two) times daily.   GABAPENTIN (NEURONTIN) 100 MG CAPSULE    TAKE 1 CAPSULE BY MOUTH TWICE A DAY   GLUCOSE BLOOD (ACCU-CHEK AVIVA PLUS) TEST STRIP    Use to check sugar once daily in the morning   LANCETS (ACCU-CHEK SOFT TOUCH) LANCETS    Use to check sugar daily   LISINOPRIL-HYDROCHLOROTHIAZIDE (PRINZIDE,ZESTORETIC) 20-25 MG TABLET    Take 1 tablet by mouth daily.   METHOCARBAMOL (ROBAXIN) 500 MG TABLET    Take 1 tablet (500 mg total) by mouth every 6 (six) hours as needed for muscle spasms.   MULTIVITAMIN-IRON-MINERALS-FOLIC ACID (CENTRUM) CHEWABLE TABLET    Chew 1 tablet by mouth daily.    NON FORMULARY    Diet type:  NAS   OXYCODONE (OXY IR/ROXICODONE) 5 MG IMMEDIATE RELEASE TABLET    Take 1-2 tablets (5-10 mg total) by mouth every 4 (four) hours as needed for moderate pain (pain score 4-6).   SENNA (SENOKOT) 8.6 MG TABS TABLET    Take 1 tablet by mouth 2 (two) times daily.   SERTRALINE (ZOLOFT) 25 MG TABLET    Take 75 mg by mouth daily.   TEDUGLUTIDE, RDNA, (GATTEX) 5 MG KIT    Inject 5 mg into the skin daily.  Modified Medications   No medications on file  Discontinued Medications   No medications on file     SIGNIFICANT DIAGNOSTIC EXAMS   LABS REVIEWED PREVIOUS:   04-29-18: tsh 1.710 hgb a1c 7.0  06-19-18: wbc 7.8; hgb 12.7; hct 41.2; mcv 83.4 plt 219; glucose 139; bun 22; creat  1.33; k+ 4.4; na++ 140; ca 10.4  NO NEW LABS.    Review of Systems  Constitutional: Negative for malaise/fatigue.  Respiratory: Negative for cough and shortness of breath.   Cardiovascular: Negative for chest pain, palpitations and leg swelling.  Gastrointestinal: Negative for abdominal pain, constipation and heartburn.  Musculoskeletal: Negative for back pain, joint pain and myalgias.  Skin: Negative.   Neurological: Negative for dizziness.  Psychiatric/Behavioral: The patient is not nervous/anxious.      Physical Exam Constitutional:      General: She is not in acute distress.    Appearance: She is well-developed. She is not diaphoretic.  Neck:     Musculoskeletal: Neck supple.     Thyroid: No thyromegaly.  Cardiovascular:     Rate and Rhythm: Normal rate and regular rhythm.     Pulses: Normal pulses.  Pulmonary:     Effort: Pulmonary effort is normal. No respiratory distress.     Breath sounds: Normal breath sounds.  Abdominal:     General: Bowel sounds are normal. There is no distension.     Palpations: Abdomen is soft.  Tenderness: There is no abdominal tenderness.  Musculoskeletal:     Right lower leg: No edema.     Left lower leg: No edema.     Comments: Is able to move all extremities  Status post left knee replacement     Lymphadenopathy:     Cervical: No cervical adenopathy.  Skin:    General: Skin is warm and dry.     Comments: Incision line without signs of infection present   Neurological:     Mental Status: She is alert and oriented to person, place, and time.  Psychiatric:        Mood and Affect: Mood normal.      ASSESSMENT/ PLAN:   Patient is being discharged with the following home health services: pt/ot: to evaluate and treat as indicated for gait balance strength adl training    Patient is being discharged with the following durable medical equipment:  None needed   Patient has been advised to f/u with their PCP in 1-2 weeks to bring  them up to date on their rehab stay.  Social services at facility was responsible for arranging this appointment.  Pt was provided with a 30 day supply of prescriptions for medications and refills must be obtained from their PCP.  For controlled substances, a more limited supply may be provided adequate until PCP appointment only.   A 30 day supply of her prescription medication have been sent ot [facility] with #20 oxycodone 5 mg tabs  Time spent patient 35 minutes: discussed medications; and home health needs: verbalized understanding.   Ok Edwards NP [facility]  Contact [phone] Monday through Friday 8am- 5pm  After hours call [phone]

## 2018-07-19 ENCOUNTER — Other Ambulatory Visit: Payer: Self-pay | Admitting: Adult Health

## 2018-07-21 ENCOUNTER — Other Ambulatory Visit: Payer: Self-pay | Admitting: Adult Health

## 2018-07-21 ENCOUNTER — Other Ambulatory Visit: Payer: Self-pay

## 2018-07-21 DIAGNOSIS — I2510 Atherosclerotic heart disease of native coronary artery without angina pectoris: Secondary | ICD-10-CM | POA: Diagnosis not present

## 2018-07-21 DIAGNOSIS — I10 Essential (primary) hypertension: Secondary | ICD-10-CM | POA: Diagnosis not present

## 2018-07-21 DIAGNOSIS — E1143 Type 2 diabetes mellitus with diabetic autonomic (poly)neuropathy: Secondary | ICD-10-CM | POA: Diagnosis not present

## 2018-07-21 DIAGNOSIS — E1151 Type 2 diabetes mellitus with diabetic peripheral angiopathy without gangrene: Secondary | ICD-10-CM | POA: Diagnosis not present

## 2018-07-21 DIAGNOSIS — Z471 Aftercare following joint replacement surgery: Secondary | ICD-10-CM | POA: Diagnosis not present

## 2018-07-21 DIAGNOSIS — E1159 Type 2 diabetes mellitus with other circulatory complications: Secondary | ICD-10-CM | POA: Diagnosis not present

## 2018-07-21 DIAGNOSIS — E1169 Type 2 diabetes mellitus with other specified complication: Secondary | ICD-10-CM | POA: Diagnosis not present

## 2018-07-21 DIAGNOSIS — D649 Anemia, unspecified: Secondary | ICD-10-CM | POA: Diagnosis not present

## 2018-07-21 DIAGNOSIS — E782 Mixed hyperlipidemia: Secondary | ICD-10-CM | POA: Diagnosis not present

## 2018-07-21 NOTE — Patient Outreach (Signed)
[facility]) Care Management  07/21/2018  [name] [birth year] [id number]   Referral Date: 07/21/2018 Referral Source: [employer] Report Date of Admission:  07/06/2018 Diagnosis:  Left knee replacement Date of Discharge: 07/19/2018 Facility: [place]:  [facility]  Outreach attempt: spoke with patient. Discussed recent discharge from [facility]. Patient states that thing have been ok since being at home.  Patient lives in the home with spouse who has MS but has support of son,daughter and friend who lives in the home. Home health has made contact and is due to come today.  Patient states she is walking with a walker and able to bathe and dress herself.    Patient with recent left knee replacement due to arthritis. Additional history of DM, HTN, depression, hyperlipidemia, peripheral neuropathy, and history of bariatric surgery.    Patient has seen surgeon since surgery and due to see again on 07/27/2018.  Patient takes medications as prescribed and able to review medications.    Patient does not have an advanced directive.    Discussed [facility] services.  Patient declines any needs presently.    Plan: RN CM will close case.     [name], RN, MSN [facility] Care Management Care Management Coordinator Direct Line [phone] Toll Free: [phone]  Fax: [phone]

## 2018-07-22 ENCOUNTER — Other Ambulatory Visit: Payer: Self-pay

## 2018-07-22 NOTE — Patient Outreach (Signed)
[facility]) Care Management  07/22/2018  [name] 1955/10/15 [id number]   EMMI- General Discharge RED ON EMMI ALERT Day # 1 Date:  07/21/2018 Red Alert Reason:  Got discharge papers? No    Reached patient on 07-20-2018 for Transition of Care call.  EMMI issue addressed on previous call. No outreach needed.     Plan: RN CM will close case.    [name], RN, MSN [facility] Care Management Care Management Coordinator Direct Line [phone] Toll Free: [phone]  Fax: [phone]

## 2018-07-24 DIAGNOSIS — E782 Mixed hyperlipidemia: Secondary | ICD-10-CM | POA: Diagnosis not present

## 2018-07-24 DIAGNOSIS — D649 Anemia, unspecified: Secondary | ICD-10-CM | POA: Diagnosis not present

## 2018-07-24 DIAGNOSIS — E1151 Type 2 diabetes mellitus with diabetic peripheral angiopathy without gangrene: Secondary | ICD-10-CM | POA: Diagnosis not present

## 2018-07-24 DIAGNOSIS — Z471 Aftercare following joint replacement surgery: Secondary | ICD-10-CM | POA: Diagnosis not present

## 2018-07-24 DIAGNOSIS — I2510 Atherosclerotic heart disease of native coronary artery without angina pectoris: Secondary | ICD-10-CM | POA: Diagnosis not present

## 2018-07-24 DIAGNOSIS — E1143 Type 2 diabetes mellitus with diabetic autonomic (poly)neuropathy: Secondary | ICD-10-CM | POA: Diagnosis not present

## 2018-07-24 DIAGNOSIS — I10 Essential (primary) hypertension: Secondary | ICD-10-CM | POA: Diagnosis not present

## 2018-07-24 DIAGNOSIS — E1169 Type 2 diabetes mellitus with other specified complication: Secondary | ICD-10-CM | POA: Diagnosis not present

## 2018-07-24 DIAGNOSIS — E1159 Type 2 diabetes mellitus with other circulatory complications: Secondary | ICD-10-CM | POA: Diagnosis not present

## 2018-07-26 DIAGNOSIS — M1712 Unilateral primary osteoarthritis, left knee: Secondary | ICD-10-CM

## 2018-07-27 ENCOUNTER — Encounter (INDEPENDENT_AMBULATORY_CARE_PROVIDER_SITE_OTHER): Payer: Self-pay | Admitting: Orthopedic Surgery

## 2018-07-27 ENCOUNTER — Ambulatory Visit (INDEPENDENT_AMBULATORY_CARE_PROVIDER_SITE_OTHER): Payer: Medicare HMO | Admitting: Orthopedic Surgery

## 2018-07-27 DIAGNOSIS — Z96652 Presence of left artificial knee joint: Secondary | ICD-10-CM

## 2018-07-27 MED ORDER — OXYCODONE HCL 5 MG PO TABS: 5.0000 mg | ORAL_TABLET | ORAL | 0 refills | Status: DC | PRN

## 2018-07-28 ENCOUNTER — Encounter (INDEPENDENT_AMBULATORY_CARE_PROVIDER_SITE_OTHER): Payer: Self-pay | Admitting: Orthopedic Surgery

## 2018-07-28 DIAGNOSIS — I10 Essential (primary) hypertension: Secondary | ICD-10-CM | POA: Diagnosis not present

## 2018-07-28 DIAGNOSIS — Z471 Aftercare following joint replacement surgery: Secondary | ICD-10-CM | POA: Diagnosis not present

## 2018-07-28 DIAGNOSIS — E1159 Type 2 diabetes mellitus with other circulatory complications: Secondary | ICD-10-CM | POA: Diagnosis not present

## 2018-07-28 DIAGNOSIS — E1143 Type 2 diabetes mellitus with diabetic autonomic (poly)neuropathy: Secondary | ICD-10-CM | POA: Diagnosis not present

## 2018-07-28 DIAGNOSIS — I2510 Atherosclerotic heart disease of native coronary artery without angina pectoris: Secondary | ICD-10-CM | POA: Diagnosis not present

## 2018-07-28 DIAGNOSIS — E1151 Type 2 diabetes mellitus with diabetic peripheral angiopathy without gangrene: Secondary | ICD-10-CM | POA: Diagnosis not present

## 2018-07-28 DIAGNOSIS — E782 Mixed hyperlipidemia: Secondary | ICD-10-CM | POA: Diagnosis not present

## 2018-07-28 DIAGNOSIS — E1169 Type 2 diabetes mellitus with other specified complication: Secondary | ICD-10-CM | POA: Diagnosis not present

## 2018-07-28 DIAGNOSIS — D649 Anemia, unspecified: Secondary | ICD-10-CM | POA: Diagnosis not present

## 2018-07-28 NOTE — Progress Notes (Signed)
Post-Op Visit Note   Patient: [name]           Date of Birth: [birth year]           MRN: [id number] Visit Date: 07/27/2018 PCP: Steve Rattler, DO   Assessment & Plan:  Chief Complaint:  Chief Complaint  Patient presents with  . Left Knee - Pain, Follow-up   Visit Diagnoses:  1. Status post total left knee replacement     Plan: There is a patient is now about a month out left total knee replacement.  She is doing well.  On exam she is lacking about 5 degrees of full extension and flexes to about 80.  She needs to continue with home health PT refill oxycodone no calf tenderness today.  Like her to be about 110 in 4 weeks.  Incision intact.  Follow-Up Instructions: Return in about 4 weeks (around 08/24/2018).   Orders:  No orders of the defined types were placed in this encounter.  Meds ordered this encounter  Medications  . oxyCODONE (OXY IR/ROXICODONE) 5 MG immediate release tablet    Sig: Take 1-2 tablets (5-10 mg total) by mouth every 4 (four) hours as needed for moderate pain (pain score 4-6).    Dispense:  20 tablet    Refill:  0    Imaging: No results found.  PMFS History: Patient Active Problem List   Diagnosis Date Noted  . Unilateral primary osteoarthritis, left knee   . Hypertension associated with diabetes ([place]) 07/11/2018  . Dyslipidemia associated with type 2 diabetes mellitus ([place]) 07/11/2018  . Short bowel syndrome 07/11/2018  . Peripheral autonomic neuropathy due to diabetes mellitus ([place]) 07/11/2018  . Status post left knee replacement 07/11/2018  . Arthritis of knee 06/30/2018  . Mild cognitive impairment 04/30/2018  . Venous stasis dermatitis of right lower extremity 01/02/2018  . Genetic testing 11/24/2017  . Family history of colon cancer   . B12 deficiency 12/13/2016  . Malabsorption 03/12/2016  . Tubular adenoma of colon 03/09/2016  . Vitamin D deficiency 03/09/2016  . Rectovaginal fistula 05/25/2014  . S/P bariatric  surgery-duodenal switch with sleeve gastrectomy 12/04/2013  . Nonobstructive CAD  08/26/2013  . Iron deficiency anemia 06/19/2012  . Low back pain 03/05/2012  . Primary osteoarthritis of left knee 04/23/2011  . UNSPECIFIED VENOUS INSUFFICIENCY 05/28/2010  . PSORIASIS 05/28/2010  . HYPERCHOLESTEROLEMIA 03/20/2010  . Essential hypertension 03/20/2010  . Chronic dermatitis of hands 05/02/2009  . Peripheral neuropathy 01/12/2009  . Type 2 diabetes, controlled, with neuropathy ([place]) 02/18/2008  . Chronic depression 10/06/2007  . OBSTRUCTIVE SLEEP APNEA 06/12/2007  . GOITER, MULTINODULAR 05/13/2007   Past Medical History:  Diagnosis Date  . Allergy   . ANEMIA, PERNICIOUS, HX OF 05/13/2007  . Anxiety   . Arthritis   . ASYMPTOMATIC POSTMENOPAUSAL STATUS 02/18/2008  . BACK PAIN, LUMBAR 11/19/2007  . Bowel incontinence   . Cataract   . Cellulitis of left lower leg 10/28/2013  . Cellulitis of leg, right 10/11/2010  . DEPRESSION, CHRONIC 10/06/2007  . Diabetes mellitus without complication ([place])   . DIABETES MELLITUS, WITH NEUROLOGICAL COMPLICATIONS 6/37/8588  . Diabetic neuropathy ([place])   . DYSLIPIDEMIA 05/13/2007  . Family history of colon cancer   . Fecal soiling 06/28/2014  . Frozen shoulder    Lt  . Full dentures   . GERD (gastroesophageal reflux disease)   . GOITER, MULTINODULAR 05/13/2007  . Headache   . Hx of colonic polyps 12/04/2010  . HYPERCHOLESTEROLEMIA 03/20/2010  .  HYPERTENSION 03/20/2010  . Lower back pain   . NASH (nonalcoholic steatohepatitis)   . OA (osteoarthritis)   . OBSTRUCTIVE SLEEP APNEA 06/12/2007   uses CPAP.  Marland Kitchen PERIPHERAL NEUROPATHY 01/12/2009  . Peripheral vascular disease ([place])   . Pneumonia   . PSORIASIS 05/28/2010  . Renal insufficiency    stage 1 kd  . Sleep apnea   . UNSPECIFIED VENOUS INSUFFICIENCY 05/28/2010  . Wears glasses     Family History  Problem Relation Age of Onset  . Hyperlipidemia Father   . Hypertension Father   . Cirrhosis Father     . Colon cancer Father 8       d. 67  . Colon cancer Sister 74       d. 83  . Bipolar disorder Sister   . Aneurysm Mother        brain  . Cerebral aneurysm Mother   . Colon cancer Sister 25       d. 78  . Bipolar disorder Sister   . Cancer Paternal Aunt        NOS, ? colon  . Congestive Heart Failure Maternal Grandmother   . Drug abuse Neg Hx   . CAD Neg Hx   . Stomach cancer Neg Hx     Past Surgical History:  Procedure Laterality Date  . CATARACT EXTRACTION W/ INTRAOCULAR LENS  IMPLANT, BILATERAL    . COLONOSCOPY    . ELECTROCARDIOGRAM  04/16/2006  . EXAMINATION UNDER ANESTHESIA N/A 06/29/2014   Procedure: EXAM UNDER ANESTHESIA;  Surgeon: Janyth Contes, MD;  Location: [facility];  Service: Gynecology;  Laterality: N/A;  . Exercise myoview  01/24/2005  . FLEXIBLE SIGMOIDOSCOPY    . GASTRIC BYPASS  11/09/2013  . GASTRIC BYPASS    . JOINT REPLACEMENT    . KNEE ARTHROSCOPY  2003   right  . LAPAROSCOPY N/A 03/12/2016   Procedure: LAPAROSCOPIC ANASTOMOSIS OF INTESTINE (ENTEROENTEROSTOMY);  Surgeon: Ladora Daniel, MD;  Location: [facility];  Service: General;  Laterality: N/A;  . LEG SURGERY     metal and pins in lower left leg  . mrsa Right    arm  . MULTIPLE TOOTH EXTRACTIONS    . ROTATOR CUFF REPAIR  2008   right  . SHOULDER ARTHROSCOPY W/ ROTATOR CUFF REPAIR Right   . TONSILLECTOMY    . TOTAL KNEE ARTHROPLASTY Left 06/30/2018  . TOTAL KNEE ARTHROPLASTY Left 06/30/2018   Procedure: LEFT TOTAL KNEE ARTHROPLASTY;  Surgeon: Meredith Pel, MD;  Location: [place];  Service: Orthopedics;  Laterality: Left;  Marland Kitchen VARICOSE VEIN SURGERY     Remotef   Social History   Occupational History  . Occupation: Unemployed    Comment: disabled  Tobacco Use  . Smoking status: Former Smoker    Packs/day: 2.00    Years: 24.00    Pack years: 48.00    Types: Cigarettes    Start date: 06/24/1970    Last attempt to quit: 08/06/1994    Years since quitting: 23.9  . Smokeless tobacco:  Never Used  Substance and Sexual Activity  . Alcohol use: No    Alcohol/week: 0.0 standard drinks  . Drug use: No  . Sexual activity: Never

## 2018-07-31 DIAGNOSIS — I2510 Atherosclerotic heart disease of native coronary artery without angina pectoris: Secondary | ICD-10-CM | POA: Diagnosis not present

## 2018-07-31 DIAGNOSIS — E1159 Type 2 diabetes mellitus with other circulatory complications: Secondary | ICD-10-CM | POA: Diagnosis not present

## 2018-07-31 DIAGNOSIS — E1143 Type 2 diabetes mellitus with diabetic autonomic (poly)neuropathy: Secondary | ICD-10-CM | POA: Diagnosis not present

## 2018-07-31 DIAGNOSIS — E782 Mixed hyperlipidemia: Secondary | ICD-10-CM | POA: Diagnosis not present

## 2018-07-31 DIAGNOSIS — E1151 Type 2 diabetes mellitus with diabetic peripheral angiopathy without gangrene: Secondary | ICD-10-CM | POA: Diagnosis not present

## 2018-07-31 DIAGNOSIS — E1169 Type 2 diabetes mellitus with other specified complication: Secondary | ICD-10-CM | POA: Diagnosis not present

## 2018-07-31 DIAGNOSIS — I10 Essential (primary) hypertension: Secondary | ICD-10-CM | POA: Diagnosis not present

## 2018-07-31 DIAGNOSIS — D649 Anemia, unspecified: Secondary | ICD-10-CM | POA: Diagnosis not present

## 2018-07-31 DIAGNOSIS — Z471 Aftercare following joint replacement surgery: Secondary | ICD-10-CM | POA: Diagnosis not present

## 2018-08-04 DIAGNOSIS — D649 Anemia, unspecified: Secondary | ICD-10-CM | POA: Diagnosis not present

## 2018-08-04 DIAGNOSIS — E782 Mixed hyperlipidemia: Secondary | ICD-10-CM | POA: Diagnosis not present

## 2018-08-04 DIAGNOSIS — E1151 Type 2 diabetes mellitus with diabetic peripheral angiopathy without gangrene: Secondary | ICD-10-CM | POA: Diagnosis not present

## 2018-08-04 DIAGNOSIS — E1169 Type 2 diabetes mellitus with other specified complication: Secondary | ICD-10-CM | POA: Diagnosis not present

## 2018-08-04 DIAGNOSIS — E1143 Type 2 diabetes mellitus with diabetic autonomic (poly)neuropathy: Secondary | ICD-10-CM | POA: Diagnosis not present

## 2018-08-04 DIAGNOSIS — I10 Essential (primary) hypertension: Secondary | ICD-10-CM | POA: Diagnosis not present

## 2018-08-04 DIAGNOSIS — E1159 Type 2 diabetes mellitus with other circulatory complications: Secondary | ICD-10-CM | POA: Diagnosis not present

## 2018-08-04 DIAGNOSIS — Z471 Aftercare following joint replacement surgery: Secondary | ICD-10-CM | POA: Diagnosis not present

## 2018-08-04 DIAGNOSIS — I2510 Atherosclerotic heart disease of native coronary artery without angina pectoris: Secondary | ICD-10-CM | POA: Diagnosis not present

## 2018-08-07 DIAGNOSIS — E1159 Type 2 diabetes mellitus with other circulatory complications: Secondary | ICD-10-CM | POA: Diagnosis not present

## 2018-08-07 DIAGNOSIS — E1151 Type 2 diabetes mellitus with diabetic peripheral angiopathy without gangrene: Secondary | ICD-10-CM | POA: Diagnosis not present

## 2018-08-07 DIAGNOSIS — Z471 Aftercare following joint replacement surgery: Secondary | ICD-10-CM | POA: Diagnosis not present

## 2018-08-07 DIAGNOSIS — I10 Essential (primary) hypertension: Secondary | ICD-10-CM | POA: Diagnosis not present

## 2018-08-07 DIAGNOSIS — D649 Anemia, unspecified: Secondary | ICD-10-CM | POA: Diagnosis not present

## 2018-08-07 DIAGNOSIS — I2510 Atherosclerotic heart disease of native coronary artery without angina pectoris: Secondary | ICD-10-CM | POA: Diagnosis not present

## 2018-08-07 DIAGNOSIS — E1143 Type 2 diabetes mellitus with diabetic autonomic (poly)neuropathy: Secondary | ICD-10-CM | POA: Diagnosis not present

## 2018-08-07 DIAGNOSIS — E1169 Type 2 diabetes mellitus with other specified complication: Secondary | ICD-10-CM | POA: Diagnosis not present

## 2018-08-07 DIAGNOSIS — E782 Mixed hyperlipidemia: Secondary | ICD-10-CM | POA: Diagnosis not present

## 2018-08-10 ENCOUNTER — Other Ambulatory Visit: Payer: Self-pay | Admitting: Adult Health

## 2018-08-11 ENCOUNTER — Other Ambulatory Visit: Payer: Self-pay

## 2018-08-11 ENCOUNTER — Encounter: Payer: Self-pay | Admitting: Family Medicine

## 2018-08-11 ENCOUNTER — Ambulatory Visit (INDEPENDENT_AMBULATORY_CARE_PROVIDER_SITE_OTHER): Payer: Medicare HMO | Admitting: Family Medicine

## 2018-08-11 ENCOUNTER — Telehealth: Payer: Self-pay

## 2018-08-11 ENCOUNTER — Ambulatory Visit: Payer: Medicare HMO | Admitting: Family Medicine

## 2018-08-11 VITALS — BP 150/70 | HR 66 | Temp 98.6°F | Ht 68.0 in | Wt 224.6 lb

## 2018-08-11 DIAGNOSIS — R05 Cough: Secondary | ICD-10-CM | POA: Diagnosis not present

## 2018-08-11 DIAGNOSIS — J069 Acute upper respiratory infection, unspecified: Secondary | ICD-10-CM | POA: Diagnosis not present

## 2018-08-11 DIAGNOSIS — R059 Cough, unspecified: Secondary | ICD-10-CM

## 2018-08-11 MED ORDER — PROMETHAZINE-DM 6.25-15 MG/5ML PO SYRP: 5.0000 mL | ORAL_SOLUTION | Freq: Every evening | ORAL | 0 refills | Status: DC | PRN

## 2018-08-11 MED ORDER — HYDROCODONE-HOMATROPINE 5-1.5 MG/5ML PO SYRP: 5.0000 mL | ORAL_SOLUTION | Freq: Three times a day (TID) | ORAL | 0 refills | Status: DC | PRN

## 2018-08-11 MED ORDER — IPRATROPIUM BROMIDE 0.03 % NA SOLN: 2.0000 | Freq: Two times a day (BID) | NASAL | 1 refills | Status: DC

## 2018-08-11 MED ORDER — BENZONATATE 200 MG PO CAPS: 200.0000 mg | ORAL_CAPSULE | Freq: Two times a day (BID) | ORAL | 0 refills | Status: DC | PRN

## 2018-08-11 NOTE — Telephone Encounter (Signed)
Fax from pharmacy that all promethazine cough syrups on back order. Please send alternative.  Danley Danker, RN [facility] RN)

## 2018-08-11 NOTE — Telephone Encounter (Signed)
Will send hycodan.

## 2018-08-11 NOTE — Progress Notes (Signed)
Subjective:    [name] is a 63 y.o. female who presents to [facility] today for URI symptoms:  1.  URI:  Present since last Thurs/Fri (about 5 days ago).  Worse Sunday night.  Did have some redness of eyes that day but this resolved.  Describes cough, runny nose, sinus congestion.  No fevers or chills.  Eating and drinking well.  No nausea.  She has had some posttussive emesis when coughing at night.  No sick contacts that she knows of.  ROS as above per HPI.  Pertinently,   The following portions of the patient's history were reviewed and updated as appropriate: allergies, current medications, past medical history, family and social history, and problem list. Patient is a nonsmoker.    PMH reviewed.   Medications reviewed.    Objective:   Physical Exam BP (!) 150/70   Pulse 66   Temp 98.6 F (37 C) (Oral)   Ht 5' 8"  (1.727 m)   Wt 224 lb 9.6 oz (101.9 kg)   SpO2 99%   BMI 34.15 kg/m  Gen:  Patient sitting on exam table, appears stated age in no acute distress Head: Normocephalic atraumatic Eyes: EOMI, PERRL, sclera and conjunctiva non-erythematous Ears:  Canals clear bilaterally.  TMs pearly gray bilaterally without erythema or bulging.   Nose:  Nasal turbinates with exudates bilaterally. Mouth: Mucosa membranes moist. Tonsils +2, nonenlarged, non-erythematous. Neck: No cervical lymphadenopathy noted Heart:  RRR, no murmurs auscultated. Pulm: Some mild rhonchi bilateral bases.  Impression/plan: 1.  Upper respiratory infection with cough: -Atrovent nasal spray for sinus congestion. -Tessalon Perles as needed for cough. -Phenergan DM to help with cough and sleep.  This would also help with any further posttussive emesis although this sounds like it was only a one-time occurrence. -Follow-up if any fevers or worsening

## 2018-08-11 NOTE — Patient Instructions (Signed)
It was good to see you today!  I'm sorry you're feeling bad.  For your nasal and any sinus congestion, use the Atrovent nasal spray.  During the day, take the Tessalon perles for cough.  At night, take the Phenergan-DM.  This should both suppress your cough and help you sleep.  You should be feeling better by this weekend.  If you're not better by next week, let us know.

## 2018-08-13 ENCOUNTER — Encounter: Payer: Self-pay | Admitting: Family Medicine

## 2018-08-14 DIAGNOSIS — D649 Anemia, unspecified: Secondary | ICD-10-CM | POA: Diagnosis not present

## 2018-08-14 DIAGNOSIS — E1159 Type 2 diabetes mellitus with other circulatory complications: Secondary | ICD-10-CM | POA: Diagnosis not present

## 2018-08-14 DIAGNOSIS — E782 Mixed hyperlipidemia: Secondary | ICD-10-CM | POA: Diagnosis not present

## 2018-08-14 DIAGNOSIS — I2510 Atherosclerotic heart disease of native coronary artery without angina pectoris: Secondary | ICD-10-CM | POA: Diagnosis not present

## 2018-08-14 DIAGNOSIS — E1143 Type 2 diabetes mellitus with diabetic autonomic (poly)neuropathy: Secondary | ICD-10-CM | POA: Diagnosis not present

## 2018-08-14 DIAGNOSIS — E1169 Type 2 diabetes mellitus with other specified complication: Secondary | ICD-10-CM | POA: Diagnosis not present

## 2018-08-14 DIAGNOSIS — E1151 Type 2 diabetes mellitus with diabetic peripheral angiopathy without gangrene: Secondary | ICD-10-CM | POA: Diagnosis not present

## 2018-08-14 DIAGNOSIS — Z471 Aftercare following joint replacement surgery: Secondary | ICD-10-CM | POA: Diagnosis not present

## 2018-08-14 DIAGNOSIS — I10 Essential (primary) hypertension: Secondary | ICD-10-CM | POA: Diagnosis not present

## 2018-08-16 DIAGNOSIS — G4710 Hypersomnia, unspecified: Secondary | ICD-10-CM | POA: Diagnosis not present

## 2018-08-16 DIAGNOSIS — I10 Essential (primary) hypertension: Secondary | ICD-10-CM | POA: Diagnosis not present

## 2018-08-16 DIAGNOSIS — G4733 Obstructive sleep apnea (adult) (pediatric): Secondary | ICD-10-CM | POA: Diagnosis not present

## 2018-08-19 DIAGNOSIS — E1143 Type 2 diabetes mellitus with diabetic autonomic (poly)neuropathy: Secondary | ICD-10-CM | POA: Diagnosis not present

## 2018-08-19 DIAGNOSIS — E1169 Type 2 diabetes mellitus with other specified complication: Secondary | ICD-10-CM | POA: Diagnosis not present

## 2018-08-19 DIAGNOSIS — E1151 Type 2 diabetes mellitus with diabetic peripheral angiopathy without gangrene: Secondary | ICD-10-CM | POA: Diagnosis not present

## 2018-08-19 DIAGNOSIS — Z471 Aftercare following joint replacement surgery: Secondary | ICD-10-CM | POA: Diagnosis not present

## 2018-08-19 DIAGNOSIS — E782 Mixed hyperlipidemia: Secondary | ICD-10-CM | POA: Diagnosis not present

## 2018-08-19 DIAGNOSIS — E1159 Type 2 diabetes mellitus with other circulatory complications: Secondary | ICD-10-CM | POA: Diagnosis not present

## 2018-08-19 DIAGNOSIS — I2510 Atherosclerotic heart disease of native coronary artery without angina pectoris: Secondary | ICD-10-CM | POA: Diagnosis not present

## 2018-08-19 DIAGNOSIS — I10 Essential (primary) hypertension: Secondary | ICD-10-CM | POA: Diagnosis not present

## 2018-08-19 DIAGNOSIS — D649 Anemia, unspecified: Secondary | ICD-10-CM | POA: Diagnosis not present

## 2018-08-21 DIAGNOSIS — I10 Essential (primary) hypertension: Secondary | ICD-10-CM | POA: Diagnosis not present

## 2018-08-21 DIAGNOSIS — D649 Anemia, unspecified: Secondary | ICD-10-CM | POA: Diagnosis not present

## 2018-08-21 DIAGNOSIS — E782 Mixed hyperlipidemia: Secondary | ICD-10-CM | POA: Diagnosis not present

## 2018-08-21 DIAGNOSIS — I2510 Atherosclerotic heart disease of native coronary artery without angina pectoris: Secondary | ICD-10-CM | POA: Diagnosis not present

## 2018-08-21 DIAGNOSIS — E1159 Type 2 diabetes mellitus with other circulatory complications: Secondary | ICD-10-CM | POA: Diagnosis not present

## 2018-08-21 DIAGNOSIS — Z471 Aftercare following joint replacement surgery: Secondary | ICD-10-CM | POA: Diagnosis not present

## 2018-08-21 DIAGNOSIS — E1169 Type 2 diabetes mellitus with other specified complication: Secondary | ICD-10-CM | POA: Diagnosis not present

## 2018-08-21 DIAGNOSIS — E1151 Type 2 diabetes mellitus with diabetic peripheral angiopathy without gangrene: Secondary | ICD-10-CM | POA: Diagnosis not present

## 2018-08-21 DIAGNOSIS — E1143 Type 2 diabetes mellitus with diabetic autonomic (poly)neuropathy: Secondary | ICD-10-CM | POA: Diagnosis not present

## 2018-08-24 ENCOUNTER — Ambulatory Visit (INDEPENDENT_AMBULATORY_CARE_PROVIDER_SITE_OTHER): Payer: Medicare HMO | Admitting: Orthopedic Surgery

## 2018-08-24 ENCOUNTER — Encounter (INDEPENDENT_AMBULATORY_CARE_PROVIDER_SITE_OTHER): Payer: Self-pay | Admitting: Orthopedic Surgery

## 2018-08-24 DIAGNOSIS — Z96652 Presence of left artificial knee joint: Secondary | ICD-10-CM

## 2018-08-24 MED ORDER — OXYCODONE HCL 5 MG PO TABS: ORAL_TABLET | ORAL | 0 refills | Status: DC

## 2018-08-24 NOTE — Progress Notes (Signed)
Post-Op Visit Note   Patient: [name]           Date of Birth: 07/27/1955           MRN: [id number] Visit Date: 08/24/2018 PCP: Steve Rattler, DO   Assessment & Plan:  Chief Complaint:  Chief Complaint  Patient presents with  . Left Knee - Follow-up   Visit Diagnoses:  1. Status post total left knee replacement     Plan: [name] is a patient is now almost 2 months out left total knee replacement.  She has been doing well.  On exam she has range of motion 0-1 20.  No effusion.  Walking without a cane.  She has some right leg varicose veins.  She wants to get that treated before her right total knee replacement which I think is a good idea to get that done beforehand.  Oxycodone refilled.  Come back in 4 weeks and will probably talk about scheduling that right total knee replacement at that time based on when her vein surgery is.  Follow-Up Instructions: Return in about 4 weeks (around 09/21/2018).   Orders:  No orders of the defined types were placed in this encounter.  Meds ordered this encounter  Medications  . oxyCODONE (OXY IR/ROXICODONE) 5 MG immediate release tablet    Sig: Take one tablet every 12 hours as needed for pain.    Dispense:  30 tablet    Refill:  0    Imaging: No results found.  PMFS History: Patient Active Problem List   Diagnosis Date Noted  . Unilateral primary osteoarthritis, left knee   . Hypertension associated with diabetes ([place]) 07/11/2018  . Dyslipidemia associated with type 2 diabetes mellitus ([place]) 07/11/2018  . Short bowel syndrome 07/11/2018  . Peripheral autonomic neuropathy due to diabetes mellitus ([place]) 07/11/2018  . Status post left knee replacement 07/11/2018  . Arthritis of knee 06/30/2018  . Mild cognitive impairment 04/30/2018  . Venous stasis dermatitis of right lower extremity 01/02/2018  . Genetic testing 11/24/2017  . Family history of colon cancer   . B12 deficiency 12/13/2016  . Malabsorption 03/12/2016  .  Tubular adenoma of colon 03/09/2016  . Vitamin D deficiency 03/09/2016  . Rectovaginal fistula 05/25/2014  . S/P bariatric surgery-duodenal switch with sleeve gastrectomy 12/04/2013  . Nonobstructive CAD  08/26/2013  . Iron deficiency anemia 06/19/2012  . Low back pain 03/05/2012  . Primary osteoarthritis of left knee 04/23/2011  . UNSPECIFIED VENOUS INSUFFICIENCY 05/28/2010  . PSORIASIS 05/28/2010  . HYPERCHOLESTEROLEMIA 03/20/2010  . Essential hypertension 03/20/2010  . Chronic dermatitis of hands 05/02/2009  . Peripheral neuropathy 01/12/2009  . Type 2 diabetes, controlled, with neuropathy ([place]) 02/18/2008  . Chronic depression 10/06/2007  . OBSTRUCTIVE SLEEP APNEA 06/12/2007  . GOITER, MULTINODULAR 05/13/2007   Past Medical History:  Diagnosis Date  . Allergy   . ANEMIA, PERNICIOUS, HX OF 05/13/2007  . Anxiety   . Arthritis   . ASYMPTOMATIC POSTMENOPAUSAL STATUS 02/18/2008  . BACK PAIN, LUMBAR 11/19/2007  . Bowel incontinence   . Cataract   . Cellulitis of left lower leg 10/28/2013  . Cellulitis of leg, right 10/11/2010  . DEPRESSION, CHRONIC 10/06/2007  . Diabetes mellitus without complication ([place])   . DIABETES MELLITUS, WITH NEUROLOGICAL COMPLICATIONS 1/49/7026  . Diabetic neuropathy ([place])   . DYSLIPIDEMIA 05/13/2007  . Family history of colon cancer   . Fecal soiling 06/28/2014  . Frozen shoulder    Lt  . Full dentures   .  GERD (gastroesophageal reflux disease)   . GOITER, MULTINODULAR 05/13/2007  . Headache   . Hx of colonic polyps 12/04/2010  . HYPERCHOLESTEROLEMIA 03/20/2010  . HYPERTENSION 03/20/2010  . Lower back pain   . NASH (nonalcoholic steatohepatitis)   . OA (osteoarthritis)   . OBSTRUCTIVE SLEEP APNEA 06/12/2007   uses CPAP.  [name] PERIPHERAL NEUROPATHY 01/12/2009  . Peripheral vascular disease ([place])   . Pneumonia   . PSORIASIS 05/28/2010  . Renal insufficiency    stage 1 kd  . Sleep apnea   . UNSPECIFIED VENOUS INSUFFICIENCY 05/28/2010  . Wears glasses       Family History  Problem Relation Age of Onset  . Hyperlipidemia Father   . Hypertension Father   . Cirrhosis Father   . Colon cancer Father 77       d. 2  . Colon cancer Sister 3       d. 55  . Bipolar disorder Sister   . Aneurysm Mother        brain  . Cerebral aneurysm Mother   . Colon cancer Sister 45       d. 34  . Bipolar disorder Sister   . Cancer Paternal Aunt        NOS, ? colon  . Congestive Heart Failure Maternal Grandmother   . Drug abuse Neg Hx   . CAD Neg Hx   . Stomach cancer Neg Hx     Past Surgical History:  Procedure Laterality Date  . CATARACT EXTRACTION W/ INTRAOCULAR LENS  IMPLANT, BILATERAL    . COLONOSCOPY    . ELECTROCARDIOGRAM  04/16/2006  . EXAMINATION UNDER ANESTHESIA N/A 06/29/2014   Procedure: EXAM UNDER ANESTHESIA;  Surgeon: Janyth Contes, MD;  Location: [facility];  Service: Gynecology;  Laterality: N/A;  . Exercise myoview  01/24/2005  . FLEXIBLE SIGMOIDOSCOPY    . GASTRIC BYPASS  11/09/2013  . GASTRIC BYPASS    . JOINT REPLACEMENT    . KNEE ARTHROSCOPY  2003   right  . LAPAROSCOPY N/A 03/12/2016   Procedure: LAPAROSCOPIC ANASTOMOSIS OF INTESTINE (ENTEROENTEROSTOMY);  Surgeon: Ladora Daniel, MD;  Location: [facility];  Service: General;  Laterality: N/A;  . LEG SURGERY     metal and pins in lower left leg  . mrsa Right    arm  . MULTIPLE TOOTH EXTRACTIONS    . ROTATOR CUFF REPAIR  2008   right  . SHOULDER ARTHROSCOPY W/ ROTATOR CUFF REPAIR Right   . TONSILLECTOMY    . TOTAL KNEE ARTHROPLASTY Left 06/30/2018  . TOTAL KNEE ARTHROPLASTY Left 06/30/2018   Procedure: LEFT TOTAL KNEE ARTHROPLASTY;  Surgeon: Meredith Pel, MD;  Location: [facility];  Service: Orthopedics;  Laterality: Left;  [name] VARICOSE VEIN SURGERY     Remotef   Social History   Occupational History  . Occupation: Unemployed    Comment: disabled  Tobacco Use  . Smoking status: Former Smoker    Packs/day: 2.00    Years: 24.00    Pack years: 48.00    Types:  Cigarettes    Start date: 06/24/1970    Last attempt to quit: 08/06/1994    Years since quitting: 24.0  . Smokeless tobacco: Never Used  Substance and Sexual Activity  . Alcohol use: No    Alcohol/week: 0.0 standard drinks  . Drug use: No  . Sexual activity: Never

## 2018-08-25 DIAGNOSIS — I2510 Atherosclerotic heart disease of native coronary artery without angina pectoris: Secondary | ICD-10-CM | POA: Diagnosis not present

## 2018-08-25 DIAGNOSIS — E1143 Type 2 diabetes mellitus with diabetic autonomic (poly)neuropathy: Secondary | ICD-10-CM | POA: Diagnosis not present

## 2018-08-25 DIAGNOSIS — E1151 Type 2 diabetes mellitus with diabetic peripheral angiopathy without gangrene: Secondary | ICD-10-CM | POA: Diagnosis not present

## 2018-08-25 DIAGNOSIS — I10 Essential (primary) hypertension: Secondary | ICD-10-CM | POA: Diagnosis not present

## 2018-08-25 DIAGNOSIS — E1169 Type 2 diabetes mellitus with other specified complication: Secondary | ICD-10-CM | POA: Diagnosis not present

## 2018-08-25 DIAGNOSIS — D649 Anemia, unspecified: Secondary | ICD-10-CM | POA: Diagnosis not present

## 2018-08-25 DIAGNOSIS — E782 Mixed hyperlipidemia: Secondary | ICD-10-CM | POA: Diagnosis not present

## 2018-08-25 DIAGNOSIS — Z471 Aftercare following joint replacement surgery: Secondary | ICD-10-CM | POA: Diagnosis not present

## 2018-08-25 DIAGNOSIS — E1159 Type 2 diabetes mellitus with other circulatory complications: Secondary | ICD-10-CM | POA: Diagnosis not present

## 2018-08-28 DIAGNOSIS — E1143 Type 2 diabetes mellitus with diabetic autonomic (poly)neuropathy: Secondary | ICD-10-CM | POA: Diagnosis not present

## 2018-08-28 DIAGNOSIS — I10 Essential (primary) hypertension: Secondary | ICD-10-CM | POA: Diagnosis not present

## 2018-08-28 DIAGNOSIS — Z471 Aftercare following joint replacement surgery: Secondary | ICD-10-CM | POA: Diagnosis not present

## 2018-08-28 DIAGNOSIS — I2510 Atherosclerotic heart disease of native coronary artery without angina pectoris: Secondary | ICD-10-CM | POA: Diagnosis not present

## 2018-08-28 DIAGNOSIS — D649 Anemia, unspecified: Secondary | ICD-10-CM | POA: Diagnosis not present

## 2018-08-28 DIAGNOSIS — E782 Mixed hyperlipidemia: Secondary | ICD-10-CM | POA: Diagnosis not present

## 2018-08-28 DIAGNOSIS — E1151 Type 2 diabetes mellitus with diabetic peripheral angiopathy without gangrene: Secondary | ICD-10-CM | POA: Diagnosis not present

## 2018-08-28 DIAGNOSIS — E1169 Type 2 diabetes mellitus with other specified complication: Secondary | ICD-10-CM | POA: Diagnosis not present

## 2018-08-28 DIAGNOSIS — E1159 Type 2 diabetes mellitus with other circulatory complications: Secondary | ICD-10-CM | POA: Diagnosis not present

## 2018-09-01 DIAGNOSIS — E1169 Type 2 diabetes mellitus with other specified complication: Secondary | ICD-10-CM | POA: Diagnosis not present

## 2018-09-01 DIAGNOSIS — E1143 Type 2 diabetes mellitus with diabetic autonomic (poly)neuropathy: Secondary | ICD-10-CM | POA: Diagnosis not present

## 2018-09-01 DIAGNOSIS — E1151 Type 2 diabetes mellitus with diabetic peripheral angiopathy without gangrene: Secondary | ICD-10-CM | POA: Diagnosis not present

## 2018-09-01 DIAGNOSIS — I2510 Atherosclerotic heart disease of native coronary artery without angina pectoris: Secondary | ICD-10-CM | POA: Diagnosis not present

## 2018-09-01 DIAGNOSIS — I10 Essential (primary) hypertension: Secondary | ICD-10-CM | POA: Diagnosis not present

## 2018-09-01 DIAGNOSIS — Z471 Aftercare following joint replacement surgery: Secondary | ICD-10-CM | POA: Diagnosis not present

## 2018-09-01 DIAGNOSIS — E782 Mixed hyperlipidemia: Secondary | ICD-10-CM | POA: Diagnosis not present

## 2018-09-01 DIAGNOSIS — E1159 Type 2 diabetes mellitus with other circulatory complications: Secondary | ICD-10-CM | POA: Diagnosis not present

## 2018-09-01 DIAGNOSIS — D649 Anemia, unspecified: Secondary | ICD-10-CM | POA: Diagnosis not present

## 2018-09-04 DIAGNOSIS — Z471 Aftercare following joint replacement surgery: Secondary | ICD-10-CM | POA: Diagnosis not present

## 2018-09-04 DIAGNOSIS — E1169 Type 2 diabetes mellitus with other specified complication: Secondary | ICD-10-CM | POA: Diagnosis not present

## 2018-09-04 DIAGNOSIS — E1159 Type 2 diabetes mellitus with other circulatory complications: Secondary | ICD-10-CM | POA: Diagnosis not present

## 2018-09-04 DIAGNOSIS — I10 Essential (primary) hypertension: Secondary | ICD-10-CM | POA: Diagnosis not present

## 2018-09-04 DIAGNOSIS — D649 Anemia, unspecified: Secondary | ICD-10-CM | POA: Diagnosis not present

## 2018-09-04 DIAGNOSIS — I2510 Atherosclerotic heart disease of native coronary artery without angina pectoris: Secondary | ICD-10-CM | POA: Diagnosis not present

## 2018-09-04 DIAGNOSIS — E1151 Type 2 diabetes mellitus with diabetic peripheral angiopathy without gangrene: Secondary | ICD-10-CM | POA: Diagnosis not present

## 2018-09-04 DIAGNOSIS — E782 Mixed hyperlipidemia: Secondary | ICD-10-CM | POA: Diagnosis not present

## 2018-09-04 DIAGNOSIS — E1143 Type 2 diabetes mellitus with diabetic autonomic (poly)neuropathy: Secondary | ICD-10-CM | POA: Diagnosis not present

## 2018-09-08 DIAGNOSIS — E1151 Type 2 diabetes mellitus with diabetic peripheral angiopathy without gangrene: Secondary | ICD-10-CM | POA: Diagnosis not present

## 2018-09-08 DIAGNOSIS — E782 Mixed hyperlipidemia: Secondary | ICD-10-CM | POA: Diagnosis not present

## 2018-09-08 DIAGNOSIS — E1143 Type 2 diabetes mellitus with diabetic autonomic (poly)neuropathy: Secondary | ICD-10-CM | POA: Diagnosis not present

## 2018-09-08 DIAGNOSIS — Z471 Aftercare following joint replacement surgery: Secondary | ICD-10-CM | POA: Diagnosis not present

## 2018-09-08 DIAGNOSIS — I2510 Atherosclerotic heart disease of native coronary artery without angina pectoris: Secondary | ICD-10-CM | POA: Diagnosis not present

## 2018-09-08 DIAGNOSIS — E1169 Type 2 diabetes mellitus with other specified complication: Secondary | ICD-10-CM | POA: Diagnosis not present

## 2018-09-08 DIAGNOSIS — I10 Essential (primary) hypertension: Secondary | ICD-10-CM | POA: Diagnosis not present

## 2018-09-08 DIAGNOSIS — E1159 Type 2 diabetes mellitus with other circulatory complications: Secondary | ICD-10-CM | POA: Diagnosis not present

## 2018-09-08 DIAGNOSIS — D649 Anemia, unspecified: Secondary | ICD-10-CM | POA: Diagnosis not present

## 2018-09-11 DIAGNOSIS — E782 Mixed hyperlipidemia: Secondary | ICD-10-CM | POA: Diagnosis not present

## 2018-09-11 DIAGNOSIS — E1151 Type 2 diabetes mellitus with diabetic peripheral angiopathy without gangrene: Secondary | ICD-10-CM | POA: Diagnosis not present

## 2018-09-11 DIAGNOSIS — E1169 Type 2 diabetes mellitus with other specified complication: Secondary | ICD-10-CM | POA: Diagnosis not present

## 2018-09-11 DIAGNOSIS — Z471 Aftercare following joint replacement surgery: Secondary | ICD-10-CM | POA: Diagnosis not present

## 2018-09-11 DIAGNOSIS — E1159 Type 2 diabetes mellitus with other circulatory complications: Secondary | ICD-10-CM | POA: Diagnosis not present

## 2018-09-11 DIAGNOSIS — D649 Anemia, unspecified: Secondary | ICD-10-CM | POA: Diagnosis not present

## 2018-09-11 DIAGNOSIS — E1143 Type 2 diabetes mellitus with diabetic autonomic (poly)neuropathy: Secondary | ICD-10-CM | POA: Diagnosis not present

## 2018-09-11 DIAGNOSIS — I10 Essential (primary) hypertension: Secondary | ICD-10-CM | POA: Diagnosis not present

## 2018-09-11 DIAGNOSIS — I2510 Atherosclerotic heart disease of native coronary artery without angina pectoris: Secondary | ICD-10-CM | POA: Diagnosis not present

## 2018-09-13 ENCOUNTER — Other Ambulatory Visit: Payer: Self-pay | Admitting: Adult Health

## 2018-09-14 DIAGNOSIS — G4733 Obstructive sleep apnea (adult) (pediatric): Secondary | ICD-10-CM | POA: Diagnosis not present

## 2018-09-14 DIAGNOSIS — I10 Essential (primary) hypertension: Secondary | ICD-10-CM | POA: Diagnosis not present

## 2018-09-14 DIAGNOSIS — G4710 Hypersomnia, unspecified: Secondary | ICD-10-CM | POA: Diagnosis not present

## 2018-09-15 DIAGNOSIS — E1143 Type 2 diabetes mellitus with diabetic autonomic (poly)neuropathy: Secondary | ICD-10-CM | POA: Diagnosis not present

## 2018-09-15 DIAGNOSIS — D649 Anemia, unspecified: Secondary | ICD-10-CM | POA: Diagnosis not present

## 2018-09-15 DIAGNOSIS — I2510 Atherosclerotic heart disease of native coronary artery without angina pectoris: Secondary | ICD-10-CM | POA: Diagnosis not present

## 2018-09-15 DIAGNOSIS — E782 Mixed hyperlipidemia: Secondary | ICD-10-CM | POA: Diagnosis not present

## 2018-09-15 DIAGNOSIS — E1159 Type 2 diabetes mellitus with other circulatory complications: Secondary | ICD-10-CM | POA: Diagnosis not present

## 2018-09-15 DIAGNOSIS — E1169 Type 2 diabetes mellitus with other specified complication: Secondary | ICD-10-CM | POA: Diagnosis not present

## 2018-09-15 DIAGNOSIS — E1151 Type 2 diabetes mellitus with diabetic peripheral angiopathy without gangrene: Secondary | ICD-10-CM | POA: Diagnosis not present

## 2018-09-15 DIAGNOSIS — I10 Essential (primary) hypertension: Secondary | ICD-10-CM | POA: Diagnosis not present

## 2018-09-15 DIAGNOSIS — Z471 Aftercare following joint replacement surgery: Secondary | ICD-10-CM | POA: Diagnosis not present

## 2018-09-18 ENCOUNTER — Telehealth (INDEPENDENT_AMBULATORY_CARE_PROVIDER_SITE_OTHER): Payer: Self-pay | Admitting: Radiology

## 2018-09-18 DIAGNOSIS — I2510 Atherosclerotic heart disease of native coronary artery without angina pectoris: Secondary | ICD-10-CM | POA: Diagnosis not present

## 2018-09-18 DIAGNOSIS — E1169 Type 2 diabetes mellitus with other specified complication: Secondary | ICD-10-CM | POA: Diagnosis not present

## 2018-09-18 DIAGNOSIS — D649 Anemia, unspecified: Secondary | ICD-10-CM | POA: Diagnosis not present

## 2018-09-18 DIAGNOSIS — I10 Essential (primary) hypertension: Secondary | ICD-10-CM | POA: Diagnosis not present

## 2018-09-18 DIAGNOSIS — E782 Mixed hyperlipidemia: Secondary | ICD-10-CM | POA: Diagnosis not present

## 2018-09-18 DIAGNOSIS — Z471 Aftercare following joint replacement surgery: Secondary | ICD-10-CM | POA: Diagnosis not present

## 2018-09-18 DIAGNOSIS — E1159 Type 2 diabetes mellitus with other circulatory complications: Secondary | ICD-10-CM | POA: Diagnosis not present

## 2018-09-18 DIAGNOSIS — E1143 Type 2 diabetes mellitus with diabetic autonomic (poly)neuropathy: Secondary | ICD-10-CM | POA: Diagnosis not present

## 2018-09-18 DIAGNOSIS — E1151 Type 2 diabetes mellitus with diabetic peripheral angiopathy without gangrene: Secondary | ICD-10-CM | POA: Diagnosis not present

## 2018-09-18 NOTE — Telephone Encounter (Signed)
Called and spoke with patient, patient answered NO to all pre screening questions.

## 2018-09-21 ENCOUNTER — Encounter (INDEPENDENT_AMBULATORY_CARE_PROVIDER_SITE_OTHER): Payer: Self-pay | Admitting: Orthopedic Surgery

## 2018-09-21 ENCOUNTER — Other Ambulatory Visit: Payer: Self-pay

## 2018-09-21 ENCOUNTER — Ambulatory Visit (INDEPENDENT_AMBULATORY_CARE_PROVIDER_SITE_OTHER): Payer: Medicare HMO | Admitting: Orthopedic Surgery

## 2018-09-21 DIAGNOSIS — Z96652 Presence of left artificial knee joint: Secondary | ICD-10-CM

## 2018-09-21 NOTE — Progress Notes (Signed)
Post-Op Visit Note   Patient: [name]           Date of Birth: 1955/10/07           MRN: [id number] Visit Date: 09/21/2018 PCP: Steve Rattler, DO   Assessment & Plan:  Chief Complaint:  Chief Complaint  Patient presents with  . Left Knee - Follow-up   Visit Diagnoses:  1. Status post total left knee replacement     Plan: [name] is a patient who is now about 10 weeks out left total knee replacement.  Range of motion 0-1 23.  Is scheduled for right leg vein varicosity removal sometime in June.  I want her to come back about a month after that and we can potentially schedule a right total knee replacement at that time.  Follow-Up Instructions: Return if symptoms worsen or fail to improve.   Orders:  No orders of the defined types were placed in this encounter.  No orders of the defined types were placed in this encounter.   Imaging: No results found.  PMFS History: Patient Active Problem List   Diagnosis Date Noted  . Unilateral primary osteoarthritis, left knee   . Hypertension associated with diabetes ([place]) 07/11/2018  . Dyslipidemia associated with type 2 diabetes mellitus ([place]) 07/11/2018  . Short bowel syndrome 07/11/2018  . Peripheral autonomic neuropathy due to diabetes mellitus ([place]) 07/11/2018  . Status post left knee replacement 07/11/2018  . Arthritis of knee 06/30/2018  . Mild cognitive impairment 04/30/2018  . Venous stasis dermatitis of right lower extremity 01/02/2018  . Genetic testing 11/24/2017  . Family history of colon cancer   . B12 deficiency 12/13/2016  . Malabsorption 03/12/2016  . Tubular adenoma of colon 03/09/2016  . Vitamin D deficiency 03/09/2016  . Rectovaginal fistula 05/25/2014  . S/P bariatric surgery-duodenal switch with sleeve gastrectomy 12/04/2013  . Nonobstructive CAD  08/26/2013  . Iron deficiency anemia 06/19/2012  . Low back pain 03/05/2012  . Primary osteoarthritis of left knee 04/23/2011  . UNSPECIFIED VENOUS  INSUFFICIENCY 05/28/2010  . PSORIASIS 05/28/2010  . HYPERCHOLESTEROLEMIA 03/20/2010  . Essential hypertension 03/20/2010  . Chronic dermatitis of hands 05/02/2009  . Peripheral neuropathy 01/12/2009  . Type 2 diabetes, controlled, with neuropathy ([place]) 02/18/2008  . Chronic depression 10/06/2007  . OBSTRUCTIVE SLEEP APNEA 06/12/2007  . GOITER, MULTINODULAR 05/13/2007   Past Medical History:  Diagnosis Date  . Allergy   . ANEMIA, PERNICIOUS, HX OF 05/13/2007  . Anxiety   . Arthritis   . ASYMPTOMATIC POSTMENOPAUSAL STATUS 02/18/2008  . BACK PAIN, LUMBAR 11/19/2007  . Bowel incontinence   . Cataract   . Cellulitis of left lower leg 10/28/2013  . Cellulitis of leg, right 10/11/2010  . DEPRESSION, CHRONIC 10/06/2007  . Diabetes mellitus without complication ([place])   . DIABETES MELLITUS, WITH NEUROLOGICAL COMPLICATIONS 0/37/0488  . Diabetic neuropathy ([place])   . DYSLIPIDEMIA 05/13/2007  . Family history of colon cancer   . Fecal soiling 06/28/2014  . Frozen shoulder    Lt  . Full dentures   . GERD (gastroesophageal reflux disease)   . GOITER, MULTINODULAR 05/13/2007  . Headache   . Hx of colonic polyps 12/04/2010  . HYPERCHOLESTEROLEMIA 03/20/2010  . HYPERTENSION 03/20/2010  . Lower back pain   . NASH (nonalcoholic steatohepatitis)   . OA (osteoarthritis)   . OBSTRUCTIVE SLEEP APNEA 06/12/2007   uses CPAP.  Marland Kitchen PERIPHERAL NEUROPATHY 01/12/2009  . Peripheral vascular disease ([place])   . Pneumonia   .  PSORIASIS 05/28/2010  . Renal insufficiency    stage 1 kd  . Sleep apnea   . UNSPECIFIED VENOUS INSUFFICIENCY 05/28/2010  . Wears glasses     Family History  Problem Relation Age of Onset  . Hyperlipidemia Father   . Hypertension Father   . Cirrhosis Father   . Colon cancer Father 17       d. 110  . Colon cancer Sister 26       d. 14  . Bipolar disorder Sister   . Aneurysm Mother        brain  . Cerebral aneurysm Mother   . Colon cancer Sister 58       d. 47  . Bipolar disorder  Sister   . Cancer Paternal Aunt        NOS, ? colon  . Congestive Heart Failure Maternal Grandmother   . Drug abuse Neg Hx   . CAD Neg Hx   . Stomach cancer Neg Hx     Past Surgical History:  Procedure Laterality Date  . CATARACT EXTRACTION W/ INTRAOCULAR LENS  IMPLANT, BILATERAL    . COLONOSCOPY    . ELECTROCARDIOGRAM  04/16/2006  . EXAMINATION UNDER ANESTHESIA N/A 06/29/2014   Procedure: EXAM UNDER ANESTHESIA;  Surgeon: Janyth Contes, MD;  Location: [facility];  Service: Gynecology;  Laterality: N/A;  . Exercise myoview  01/24/2005  . FLEXIBLE SIGMOIDOSCOPY    . GASTRIC BYPASS  11/09/2013  . GASTRIC BYPASS    . JOINT REPLACEMENT    . KNEE ARTHROSCOPY  2003   right  . LAPAROSCOPY N/A 03/12/2016   Procedure: LAPAROSCOPIC ANASTOMOSIS OF INTESTINE (ENTEROENTEROSTOMY);  Surgeon: Ladora Daniel, MD;  Location: [facility];  Service: General;  Laterality: N/A;  . LEG SURGERY     metal and pins in lower left leg  . mrsa Right    arm  . MULTIPLE TOOTH EXTRACTIONS    . ROTATOR CUFF REPAIR  2008   right  . SHOULDER ARTHROSCOPY W/ ROTATOR CUFF REPAIR Right   . TONSILLECTOMY    . TOTAL KNEE ARTHROPLASTY Left 06/30/2018  . TOTAL KNEE ARTHROPLASTY Left 06/30/2018   Procedure: LEFT TOTAL KNEE ARTHROPLASTY;  Surgeon: Meredith Pel, MD;  Location: [facility];  Service: Orthopedics;  Laterality: Left;  Marland Kitchen VARICOSE VEIN SURGERY     Remotef   Social History   Occupational History  . Occupation: Unemployed    Comment: disabled  Tobacco Use  . Smoking status: Former Smoker    Packs/day: 2.00    Years: 24.00    Pack years: 48.00    Types: Cigarettes    Start date: 06/24/1970    Last attempt to quit: 08/06/1994    Years since quitting: 24.1  . Smokeless tobacco: Never Used  Substance and Sexual Activity  . Alcohol use: No    Alcohol/week: 0.0 standard drinks  . Drug use: No  . Sexual activity: Never

## 2018-09-24 ENCOUNTER — Telehealth (INDEPENDENT_AMBULATORY_CARE_PROVIDER_SITE_OTHER): Payer: Self-pay

## 2018-09-24 NOTE — Telephone Encounter (Signed)
Called patient and asked the screening questions.  Do you have now or have you had in the past 7 days a fever and/or chills? NO  Do you have now or have you had in the past 7 days a cough? NO  Do you have now or have yo0u had in the last 7 days nausea, vomiting or abdominal pain? NO  Have you been exposed to anyone who has tested positive for COVID-19? NO  Have you or anyone who lives with you traveled within the last month? NO

## 2018-09-27 ENCOUNTER — Other Ambulatory Visit: Payer: Self-pay | Admitting: Adult Health

## 2018-09-28 ENCOUNTER — Encounter (INDEPENDENT_AMBULATORY_CARE_PROVIDER_SITE_OTHER): Payer: Self-pay | Admitting: Orthopedic Surgery

## 2018-09-28 ENCOUNTER — Other Ambulatory Visit: Payer: Self-pay

## 2018-09-28 ENCOUNTER — Ambulatory Visit (INDEPENDENT_AMBULATORY_CARE_PROVIDER_SITE_OTHER): Payer: Medicare HMO | Admitting: Orthopedic Surgery

## 2018-09-28 DIAGNOSIS — Z96652 Presence of left artificial knee joint: Secondary | ICD-10-CM

## 2018-09-28 NOTE — Progress Notes (Signed)
Post-Op Visit Note   Patient: [name]           Date of Birth: 12/02/1955           MRN: [id number] Visit Date: 09/28/2018 PCP: Steve Rattler, DO   Assessment & Plan:  Chief Complaint:  Chief Complaint  Patient presents with  . Left Knee - Follow-up   Visit Diagnoses:  1. Status post total left knee replacement     Plan: [name] is a patient is now 3 months out left total knee replacement.  Having some occasional pain just superior to the proximal pole of the patella.  On exam she has excellent range of motion trace effusion no warmth to the knee.  Plan at this time is to continue leg strengthening exercises.  Follow-up with me as needed.  She might get the right knee replaced sometime in the future.  Follow-Up Instructions: Return if symptoms worsen or fail to improve.   Orders:  No orders of the defined types were placed in this encounter.  No orders of the defined types were placed in this encounter.   Imaging: No results found.  PMFS History: Patient Active Problem List   Diagnosis Date Noted  . Unilateral primary osteoarthritis, left knee   . Hypertension associated with diabetes ([name]) 07/11/2018  . Dyslipidemia associated with type 2 diabetes mellitus ([name]) 07/11/2018  . Short bowel syndrome 07/11/2018  . Peripheral autonomic neuropathy due to diabetes mellitus ([name]) 07/11/2018  . Status post left knee replacement 07/11/2018  . Arthritis of knee 06/30/2018  . Mild cognitive impairment 04/30/2018  . Venous stasis dermatitis of right lower extremity 01/02/2018  . Genetic testing 11/24/2017  . Family history of colon cancer   . B12 deficiency 12/13/2016  . Malabsorption 03/12/2016  . Tubular adenoma of colon 03/09/2016  . Vitamin D deficiency 03/09/2016  . Rectovaginal fistula 05/25/2014  . S/P bariatric surgery-duodenal switch with sleeve gastrectomy 12/04/2013  . Nonobstructive CAD  08/26/2013  . Iron deficiency anemia 06/19/2012  . Low back pain  03/05/2012  . Primary osteoarthritis of left knee 04/23/2011  . UNSPECIFIED VENOUS INSUFFICIENCY 05/28/2010  . PSORIASIS 05/28/2010  . HYPERCHOLESTEROLEMIA 03/20/2010  . Essential hypertension 03/20/2010  . Chronic dermatitis of hands 05/02/2009  . Peripheral neuropathy 01/12/2009  . Type 2 diabetes, controlled, with neuropathy ([name]) 02/18/2008  . Chronic depression 10/06/2007  . OBSTRUCTIVE SLEEP APNEA 06/12/2007  . GOITER, MULTINODULAR 05/13/2007   Past Medical History:  Diagnosis Date  . Allergy   . ANEMIA, PERNICIOUS, HX OF 05/13/2007  . Anxiety   . Arthritis   . ASYMPTOMATIC POSTMENOPAUSAL STATUS 02/18/2008  . BACK PAIN, LUMBAR 11/19/2007  . Bowel incontinence   . Cataract   . Cellulitis of left lower leg 10/28/2013  . Cellulitis of leg, right 10/11/2010  . DEPRESSION, CHRONIC 10/06/2007  . Diabetes mellitus without complication ([name])   . DIABETES MELLITUS, WITH NEUROLOGICAL COMPLICATIONS 12/21/5282  . Diabetic neuropathy ([name])   . DYSLIPIDEMIA 05/13/2007  . Family history of colon cancer   . Fecal soiling 06/28/2014  . Frozen shoulder    Lt  . Full dentures   . GERD (gastroesophageal reflux disease)   . GOITER, MULTINODULAR 05/13/2007  . Headache   . Hx of colonic polyps 12/04/2010  . HYPERCHOLESTEROLEMIA 03/20/2010  . HYPERTENSION 03/20/2010  . Lower back pain   . NASH (nonalcoholic steatohepatitis)   . OA (osteoarthritis)   . OBSTRUCTIVE SLEEP APNEA 06/12/2007   uses CPAP.  Marland Kitchen PERIPHERAL NEUROPATHY  01/12/2009  . Peripheral vascular disease ([name])   . Pneumonia   . PSORIASIS 05/28/2010  . Renal insufficiency    stage 1 kd  . Sleep apnea   . UNSPECIFIED VENOUS INSUFFICIENCY 05/28/2010  . Wears glasses     Family History  Problem Relation Age of Onset  . Hyperlipidemia Father   . Hypertension Father   . Cirrhosis Father   . Colon cancer Father 89       d. 60  . Colon cancer Sister 52       d. 69  . Bipolar disorder Sister   . Aneurysm Mother        brain  .  Cerebral aneurysm Mother   . Colon cancer Sister 38       d. 84  . Bipolar disorder Sister   . Cancer Paternal Aunt        NOS, ? colon  . Congestive Heart Failure Maternal Grandmother   . Drug abuse Neg Hx   . CAD Neg Hx   . Stomach cancer Neg Hx     Past Surgical History:  Procedure Laterality Date  . CATARACT EXTRACTION W/ INTRAOCULAR LENS  IMPLANT, BILATERAL    . COLONOSCOPY    . ELECTROCARDIOGRAM  04/16/2006  . EXAMINATION UNDER ANESTHESIA N/A 06/29/2014   Procedure: EXAM UNDER ANESTHESIA;  Surgeon: Janyth Contes, MD;  Location: [facility];  Service: Gynecology;  Laterality: N/A;  . Exercise myoview  01/24/2005  . FLEXIBLE SIGMOIDOSCOPY    . GASTRIC BYPASS  11/09/2013  . GASTRIC BYPASS    . JOINT REPLACEMENT    . KNEE ARTHROSCOPY  2003   right  . LAPAROSCOPY N/A 03/12/2016   Procedure: LAPAROSCOPIC ANASTOMOSIS OF INTESTINE (ENTEROENTEROSTOMY);  Surgeon: Ladora Daniel, MD;  Location: [facility];  Service: General;  Laterality: N/A;  . LEG SURGERY     metal and pins in lower left leg  . mrsa Right    arm  . MULTIPLE TOOTH EXTRACTIONS    . ROTATOR CUFF REPAIR  2008   right  . SHOULDER ARTHROSCOPY W/ ROTATOR CUFF REPAIR Right   . TONSILLECTOMY    . TOTAL KNEE ARTHROPLASTY Left 06/30/2018  . TOTAL KNEE ARTHROPLASTY Left 06/30/2018   Procedure: LEFT TOTAL KNEE ARTHROPLASTY;  Surgeon: Meredith Pel, MD;  Location: [name];  Service: Orthopedics;  Laterality: Left;  Marland Kitchen VARICOSE VEIN SURGERY     Remotef   Social History   Occupational History  . Occupation: Unemployed    Comment: disabled  Tobacco Use  . Smoking status: Former Smoker    Packs/day: 2.00    Years: 24.00    Pack years: 48.00    Types: Cigarettes    Start date: 06/24/1970    Last attempt to quit: 08/06/1994    Years since quitting: 24.1  . Smokeless tobacco: Never Used  Substance and Sexual Activity  . Alcohol use: No    Alcohol/week: 0.0 standard drinks  . Drug use: No  . Sexual activity: Never

## 2018-10-15 DIAGNOSIS — I10 Essential (primary) hypertension: Secondary | ICD-10-CM | POA: Diagnosis not present

## 2018-10-15 DIAGNOSIS — G4710 Hypersomnia, unspecified: Secondary | ICD-10-CM | POA: Diagnosis not present

## 2018-10-15 DIAGNOSIS — G4733 Obstructive sleep apnea (adult) (pediatric): Secondary | ICD-10-CM | POA: Diagnosis not present

## 2018-10-20 ENCOUNTER — Other Ambulatory Visit: Payer: Self-pay | Admitting: Adult Health

## 2018-11-03 ENCOUNTER — Other Ambulatory Visit: Payer: Self-pay | Admitting: Adult Health

## 2018-11-10 ENCOUNTER — Other Ambulatory Visit: Payer: Self-pay

## 2018-11-10 ENCOUNTER — Encounter: Payer: Self-pay | Admitting: Family Medicine

## 2018-11-10 ENCOUNTER — Ambulatory Visit (INDEPENDENT_AMBULATORY_CARE_PROVIDER_SITE_OTHER): Payer: Medicare HMO | Admitting: Family Medicine

## 2018-11-10 VITALS — Ht 68.0 in | Wt 230.0 lb

## 2018-11-10 DIAGNOSIS — Z0000 Encounter for general adult medical examination without abnormal findings: Secondary | ICD-10-CM

## 2018-11-10 MED ORDER — SERTRALINE HCL 100 MG PO TABS: 100.0000 mg | ORAL_TABLET | Freq: Every day | ORAL | 1 refills | Status: DC

## 2018-11-10 MED ORDER — GABAPENTIN 100 MG PO CAPS: 100.0000 mg | ORAL_CAPSULE | Freq: Two times a day (BID) | ORAL | 1 refills | Status: DC

## 2018-11-10 MED ORDER — AMLODIPINE BESYLATE 10 MG PO TABS: 10.0000 mg | ORAL_TABLET | Freq: Every day | ORAL | 0 refills | Status: DC

## 2018-11-10 NOTE — Progress Notes (Signed)
Subjective:   [name] is a 63 y.o. female who presents for Medicare Annual (Subsequent) preventive examination.  The patient consented to a virtual visit.  Review of Systems:  Review of Systems  Constitutional: Negative.   HENT: Negative.   Eyes: Positive for blurred vision.  Respiratory: Negative.   Cardiovascular: Negative.   Gastrointestinal: Negative.   Genitourinary: Negative.   Musculoskeletal: Negative.   Skin: Negative.   Neurological: Negative.   Endo/Heme/Allergies: Negative.   Psychiatric/Behavioral: Positive for depression.    Cardiac Risk Factors include: diabetes mellitus;obesity (BMI >30kg/m2)     Objective:     Vitals: Ht 5' 8"  (1.727 m)   Wt 230 lb (104.3 kg)   BMI 34.97 kg/m   Body mass index is 34.97 kg/m.  Advanced Directives 11/10/2018 07/21/2018 07/17/2018 07/14/2018 07/07/2018 06/30/2018 06/19/2018  Does Patient Have a Medical Advance Directive? No No No No No No No  Would patient like information on creating a medical advance directive? Yes (MAU/Ambulatory/Procedural Areas - Information given) No - Patient declined No - Patient declined No - Patient declined No - Patient declined No - Patient declined Yes (MAU/Ambulatory/Procedural Areas - Information given)  Pre-existing out of facility DNR order (yellow form or pink MOST form) - - - - - - -    Tobacco Social History   Tobacco Use  Smoking Status Former Smoker  . Packs/day: 2.00  . Years: 24.00  . Pack years: 48.00  . Types: Cigarettes  . Start date: 06/24/1970  . Last attempt to quit: 08/06/1994  . Years since quitting: 24.2  Smokeless Tobacco Never Used     Counseling given: Not Answered   Clinical Intake:     Pain : 0-10 Pain Score: 1  Pain Type: Chronic pain Pain Location: Knee Pain Orientation: Right Pain Onset: More than a month ago Pain Frequency: Intermittent     Nutritional Status: BMI > 30  Obese Nutritional Risks: Unintentional weight gain Diabetes: Yes CBG  done?: No Did pt. bring in CBG monitor from home?: No  How often do you need to have someone help you when you read instructions, pamphlets, or other written materials from your doctor or pharmacy?: 1 - Never What is the last grade level you completed in school?: 12  Interpreter Needed?: No     Past Medical History:  Diagnosis Date  . Allergy   . ANEMIA, PERNICIOUS, HX OF 05/13/2007  . Anxiety   . Arthritis   . ASYMPTOMATIC POSTMENOPAUSAL STATUS 02/18/2008  . BACK PAIN, LUMBAR 11/19/2007  . Bowel incontinence   . Cataract   . Cellulitis of left lower leg 10/28/2013  . Cellulitis of leg, right 10/11/2010  . DEPRESSION, CHRONIC 10/06/2007  . Diabetes mellitus without complication ([place])   . DIABETES MELLITUS, WITH NEUROLOGICAL COMPLICATIONS 8/46/6599  . Diabetic neuropathy ([place])   . DYSLIPIDEMIA 05/13/2007  . Family history of colon cancer   . Fecal soiling 06/28/2014  . Frozen shoulder    Lt  . Full dentures   . GERD (gastroesophageal reflux disease)   . GOITER, MULTINODULAR 05/13/2007  . Headache   . Hx of colonic polyps 12/04/2010  . HYPERCHOLESTEROLEMIA 03/20/2010  . HYPERTENSION 03/20/2010  . Lower back pain   . NASH (nonalcoholic steatohepatitis)   . OA (osteoarthritis)   . OBSTRUCTIVE SLEEP APNEA 06/12/2007   uses CPAP.  Marland Kitchen PERIPHERAL NEUROPATHY 01/12/2009  . Peripheral vascular disease ([place])   . Pneumonia   . PSORIASIS 05/28/2010  . Renal insufficiency  stage 1 kd  . Sleep apnea   . UNSPECIFIED VENOUS INSUFFICIENCY 05/28/2010  . Wears glasses    Past Surgical History:  Procedure Laterality Date  . CATARACT EXTRACTION W/ INTRAOCULAR LENS  IMPLANT, BILATERAL    . COLONOSCOPY    . ELECTROCARDIOGRAM  04/16/2006  . EXAMINATION UNDER ANESTHESIA N/A 06/29/2014   Procedure: EXAM UNDER ANESTHESIA;  Surgeon: Janyth Contes, MD;  Location: [facility];  Service: Gynecology;  Laterality: N/A;  . Exercise myoview  01/24/2005  . FLEXIBLE SIGMOIDOSCOPY    . GASTRIC BYPASS   11/09/2013  . GASTRIC BYPASS    . JOINT REPLACEMENT    . KNEE ARTHROSCOPY  2003   right  . LAPAROSCOPY N/A 03/12/2016   Procedure: LAPAROSCOPIC ANASTOMOSIS OF INTESTINE (ENTEROENTEROSTOMY);  Surgeon: Ladora Daniel, MD;  Location: [facility];  Service: General;  Laterality: N/A;  . LEG SURGERY     metal and pins in lower left leg  . mrsa Right    arm  . MULTIPLE TOOTH EXTRACTIONS    . ROTATOR CUFF REPAIR  2008   right  . SHOULDER ARTHROSCOPY W/ ROTATOR CUFF REPAIR Right   . TONSILLECTOMY    . TOTAL KNEE ARTHROPLASTY Left 06/30/2018  . TOTAL KNEE ARTHROPLASTY Left 06/30/2018   Procedure: LEFT TOTAL KNEE ARTHROPLASTY;  Surgeon: Meredith Pel, MD;  Location: [facility];  Service: Orthopedics;  Laterality: Left;  Marland Kitchen VARICOSE VEIN SURGERY     Remotef   Family History  Problem Relation Age of Onset  . Hyperlipidemia Father   . Hypertension Father   . Cirrhosis Father   . Colon cancer Father 70       d. 47  . Colon cancer Sister 50       d. 24  . Bipolar disorder Sister   . Aneurysm Mother        brain  . Cerebral aneurysm Mother   . Colon cancer Sister 55       d. 28  . Bipolar disorder Sister   . Cancer Paternal Aunt        NOS, ? colon  . Congestive Heart Failure Maternal Grandmother   . Drug abuse Neg Hx   . CAD Neg Hx   . Stomach cancer Neg Hx    Social History   Socioeconomic History  . Marital status: Married    Spouse name: Marya Amsler  . Number of children: 2  . Years of education: [employer]  . Highest education level: Not on file  Occupational History  . Occupation: Unemployed    Comment: disabled  Social Needs  . Financial resource strain: Not on file  . Food insecurity:    Worry: Not on file    Inability: Not on file  . Transportation needs:    Medical: Not on file    Non-medical: Not on file  Tobacco Use  . Smoking status: Former Smoker    Packs/day: 2.00    Years: 24.00    Pack years: 48.00    Types: Cigarettes    Start date: 06/24/1970    Last attempt to  quit: 08/06/1994    Years since quitting: 24.2  . Smokeless tobacco: Never Used  Substance and Sexual Activity  . Alcohol use: No    Alcohol/week: 0.0 standard drinks  . Drug use: No  . Sexual activity: Never  Lifestyle  . Physical activity:    Days per week: Not on file    Minutes per session: Not on file  . Stress:  Not on file  Relationships  . Social connections:    Talks on phone: Not on file    Gets together: Not on file    Attends religious service: Not on file    Active member of club or organization: Not on file    Attends meetings of clubs or organizations: Not on file    Relationship status: Not on file  Other Topics Concern  . Not on file  Social History Narrative   Married '91, husband has MS, wheelchair bound   [id number] x 2, 1 son '91 8.5lbs, 1 daughter '92 10lbs      Caffeine use- coffee 3-4 cups daily    Outpatient Encounter Medications as of 11/10/2018  Medication Sig  . acetaminophen (TYLENOL) 325 MG tablet Take 1-2 tablets (325-650 mg total) by mouth every 6 (six) hours as needed for mild pain (pain score 1-3 or temp > 100.5).  Marland Kitchen apixaban (ELIQUIS) 5 MG TABS tablet Take 1 tablet (5 mg total) by mouth 2 (two) times daily.  Marland Kitchen atorvastatin (LIPITOR) 40 MG tablet TAKE 1 TABLET BY MOUTH EVERY DAY  . gabapentin (NEURONTIN) 100 MG capsule Take 1 capsule (100 mg total) by mouth 2 (two) times daily.  Marland Kitchen glucose blood (ACCU-CHEK AVIVA PLUS) test strip Use to check sugar once daily in the morning  . methocarbamol (ROBAXIN) 500 MG tablet Take 1 tablet (500 mg total) by mouth every 6 (six) hours as needed for muscle spasms.  Marland Kitchen sertraline (ZOLOFT) 100 MG tablet Take 1 tablet (100 mg total) by mouth daily.  . Teduglutide, rDNA, (GATTEX) 5 MG KIT Inject 5 mg into the skin daily.  . [DISCONTINUED] gabapentin (NEURONTIN) 100 MG capsule Take 1 capsule (100 mg total) by mouth 2 (two) times daily.  . [DISCONTINUED] sertraline (ZOLOFT) 25 MG tablet Take 3 tablets (75 mg total)  by mouth daily.  Marland Kitchen amLODipine (NORVASC) 10 MG tablet Take 1 tablet (10 mg total) by mouth daily.  Marland Kitchen lisinopril-hydrochlorothiazide (PRINZIDE,ZESTORETIC) 20-25 MG tablet TAKE 1 TABLET BY MOUTH EVERY DAY (Patient not taking: Reported on 11/10/2018)  . multivitamin-iron-minerals-folic acid (CENTRUM) chewable tablet Chew 1 tablet by mouth daily.   . NON FORMULARY Diet type:  NAS  . [DISCONTINUED] amLODipine (NORVASC) 10 MG tablet Take 1 tablet (10 mg total) by mouth daily. (Patient not taking: Reported on 11/10/2018)  . [DISCONTINUED] benzonatate (TESSALON) 200 MG capsule Take 1 capsule (200 mg total) by mouth 2 (two) times daily as needed for cough. (Patient not taking: Reported on 11/10/2018)  . [DISCONTINUED] Blood Glucose Monitoring Suppl (ACCU-CHEK AVIVA PLUS) w/Device KIT Use to check sugar daily  . [DISCONTINUED] Blood Glucose Monitoring Suppl (ACCU-CHEK NANO SMARTVIEW) w/Device KIT Use to check blood glucose four times a day  . [DISCONTINUED] docusate sodium (COLACE) 100 MG capsule Take 1 capsule (100 mg total) by mouth 2 (two) times daily. (Patient not taking: Reported on 07/21/2018)  . [DISCONTINUED] HYDROcodone-homatropine (HYCODAN) 5-1.5 MG/5ML syrup Take 5 mLs by mouth every 8 (eight) hours as needed for cough. (Patient not taking: Reported on 11/10/2018)  . [DISCONTINUED] ipratropium (ATROVENT) 0.03 % nasal spray Place 2 sprays into both nostrils every 12 (twelve) hours. (Patient not taking: Reported on 11/10/2018)  . [DISCONTINUED] Lancets (ACCU-CHEK SOFT TOUCH) lancets Use to check sugar daily (Patient not taking: Reported on 11/10/2018)  . [DISCONTINUED] oxyCODONE (OXY IR/ROXICODONE) 5 MG immediate release tablet Take one tablet every 12 hours as needed for pain. (Patient not taking: Reported on 11/10/2018)  . [DISCONTINUED] promethazine-dextromethorphan (PROMETHAZINE-DM) 6.25-15  MG/5ML syrup Take 5 mLs by mouth at bedtime as needed for cough. (Patient not taking: Reported on 11/10/2018)  .  [DISCONTINUED] senna (SENOKOT) 8.6 MG TABS tablet Take 1 tablet by mouth 2 (two) times daily.   No facility-administered encounter medications on file as of 11/10/2018.     Activities of Daily Living In your present state of health, do you have any difficulty performing the following activities: 11/10/2018 06/30/2018  Hearing? N -  Vision? Y -  Difficulty concentrating or making decisions? Y -  Walking or climbing stairs? N -  Dressing or bathing? N -  Doing errands, shopping? N N  Preparing Food and eating ? N -  Using the Toilet? N -  Do you have problems with loss of bowel control? N -  Managing your Medications? N -  Managing your Finances? N -  Housekeeping or managing your Housekeeping? N -  Some recent data might be hidden    Patient Care Team: Steve Rattler, DO as PCP - General (Family Medicine) Kennith Center, RD as Dietitian (Family Medicine) Macarthur Critchley, [place] as Referring Physician (Optometry) Duke Salvia Venia Carbon, MD as Attending Physician (Bariatrics) Marlou Sa, Tonna Corner, MD as Consulting Physician (Orthopedic Surgery)    Assessment:   This is a routine wellness examination for Danyeal.  Exercise Activities and Dietary recommendations Current Exercise Habits: Home exercise routine, Type of exercise: calisthenics, Time (Minutes): 60, Frequency (Times/Week): 1, Weekly Exercise (Minutes/Week): 60, Intensity: Moderate, Exercise limited by: orthopedic condition(s)  Goals    . Blood Pressure < 140/90    . Exercise 3x per week (30 min per time) (pt-stated)    . HEMOGLOBIN A1C < 7.0    . Weight (lb) < 198 lb (89.8 kg)     7% weight loss       Fall Risk Fall Risk  11/10/2018 04/29/2018 12/27/2016 02/20/2016  Falls in the past year? 1 0 No Yes  Number falls in past yr: 0 - - 2 or more  Injury with Fall? 1 - - No  Risk Factor Category  - - - High Fall Risk  Risk for fall due to : History of fall(s);Impaired balance/gait - - History of fall(s);Impaired balance/gait  Follow up  Education provided;Falls prevention discussed - - Education provided;Falls prevention discussed   Is the patient's home free of loose throw rugs in walkways, pet beds, electrical cords, etc?   no      Grab bars in the bathroom? no      Handrails on the stairs?   no stairs      Adequate lighting?   yes  Patient rating of health (0-10) scale: 6   Depression Screen PHQ 2/9 Scores 11/10/2018 08/11/2018 06/05/2018 04/29/2018  PHQ - 2 Score 2 0 - 0  PHQ- 9 Score - - - -  Exception Documentation - - Other- indicate reason in comment box -  Not completed - - pt see behavioral health  -     Cognitive Function     6CIT Screen 11/10/2018  What Year? 0 points  What month? 0 points  What time? 0 points  Count back from 20 0 points  Months in reverse 0 points  Repeat phrase 0 points  Total Score 0    Immunization History  Administered Date(s) Administered  . Influenza Split 04/23/2011, 04/03/2012  . Influenza,inj,Quad PF,6+ Mos 03/01/2013, 04/14/2014, 07/25/2015, 02/20/2016, 02/25/2017, 03/04/2018  . Pneumococcal Polysaccharide-23 07/12/2010, 11/17/2015  . Td 01/18/2005  . Tdap 09/17/2011  Qualifies for Shingles Vaccine? no  Screening Tests Health Maintenance  Topic Date Due  . OPHTHALMOLOGY EXAM  11/10/2015  . FOOT EXAM  12/26/2017  . COLONOSCOPY  10/30/2018  . HEMOGLOBIN A1C  10/28/2018  . INFLUENZA VACCINE  01/23/2019  . MAMMOGRAM  02/26/2019  . PAP SMEAR-Modifier  11/23/2019  . TETANUS/TDAP  09/16/2021  . Hepatitis C Screening  Completed  . HIV Screening  Completed    Cancer Screenings: Lung: Low Dose CT Chest recommended if Age 62-80 years, 30 pack-year currently smoking OR have quit w/in 15years. Patient does not qualify. Breast:  Up to date on Mammogram? Yes   Up to date of Bone Density/Dexa? Yes Colorectal: due for colonoscopy- has been contacted to schedule  Additional Screenings: : Hepatitis C Screening: negative 2009     Plan:    Will schedule for in  office visit to address blood pressure and mood follow up. zoloft increased to 100 mg daily. Restarted amlodipine as patient was out of this medication.   I have personally reviewed and noted the following in the patient's chart:   . Medical and social history . Use of alcohol, tobacco or illicit drugs  . Current medications and supplements . Functional ability and status . Nutritional status . Physical activity . Advanced directives . List of other physicians . Hospitalizations, surgeries, and ER visits in previous 12 months . Vitals . Screenings to include cognitive, depression, and falls . Referrals and appointments  In addition, I have reviewed and discussed with patient certain preventive protocols, quality metrics, and best practice recommendations. A written personalized care plan for preventive services as well as general preventive health recommendations were provided to patient.    This visit was conducted virtually in the setting of the [place] pandemic.    Steve Rattler, DO  11/10/2018

## 2018-11-10 NOTE — Patient Instructions (Addendum)
  You spoke to [name] Rattler, DO over the phone for your annual wellness visit.  We discussed goal of exercising 3 times a week like walking with your husband until the gyms reopen and you can get back to doing the elliptical.  We also discussed recommended health maintenance. Please call our office and schedule a visit. As discussed, you are due for your colonoscopy, please schedule this with gastroenterology.  We also discussed increasing zoloft dose and restarting blood pressure medication amlodipine.   We will see you 5/28 in office to monitor blood pressure, check A1C, and check in on mood.    Our clinic's number is [phone]. Please call with questions or concerns about what we discussed today.

## 2018-11-14 DIAGNOSIS — G4710 Hypersomnia, unspecified: Secondary | ICD-10-CM | POA: Diagnosis not present

## 2018-11-14 DIAGNOSIS — I10 Essential (primary) hypertension: Secondary | ICD-10-CM | POA: Diagnosis not present

## 2018-11-14 DIAGNOSIS — G4733 Obstructive sleep apnea (adult) (pediatric): Secondary | ICD-10-CM | POA: Diagnosis not present

## 2018-11-17 DIAGNOSIS — I2510 Atherosclerotic heart disease of native coronary artery without angina pectoris: Secondary | ICD-10-CM | POA: Diagnosis not present

## 2018-11-17 DIAGNOSIS — R001 Bradycardia, unspecified: Secondary | ICD-10-CM | POA: Diagnosis not present

## 2018-11-17 DIAGNOSIS — I10 Essential (primary) hypertension: Secondary | ICD-10-CM | POA: Diagnosis not present

## 2018-11-17 DIAGNOSIS — E785 Hyperlipidemia, unspecified: Secondary | ICD-10-CM | POA: Diagnosis not present

## 2018-11-17 DIAGNOSIS — I480 Paroxysmal atrial fibrillation: Secondary | ICD-10-CM | POA: Diagnosis not present

## 2018-11-19 ENCOUNTER — Other Ambulatory Visit: Payer: Self-pay

## 2018-11-19 ENCOUNTER — Ambulatory Visit (INDEPENDENT_AMBULATORY_CARE_PROVIDER_SITE_OTHER): Payer: Medicare HMO | Admitting: Family Medicine

## 2018-11-19 ENCOUNTER — Encounter: Payer: Self-pay | Admitting: Family Medicine

## 2018-11-19 VITALS — BP 114/60 | HR 64

## 2018-11-19 DIAGNOSIS — E1140 Type 2 diabetes mellitus with diabetic neuropathy, unspecified: Secondary | ICD-10-CM

## 2018-11-19 DIAGNOSIS — F329 Major depressive disorder, single episode, unspecified: Secondary | ICD-10-CM | POA: Diagnosis not present

## 2018-11-19 DIAGNOSIS — F32A Depression, unspecified: Secondary | ICD-10-CM

## 2018-11-19 LAB — POCT GLYCOSYLATED HEMOGLOBIN (HGB A1C): HbA1c, POC (controlled diabetic range): 8.1 % — AB (ref 0.0–7.0)

## 2018-11-19 MED ORDER — METFORMIN HCL 1000 MG PO TABS: 1000.0000 mg | ORAL_TABLET | Freq: Every day | ORAL | 2 refills | Status: DC

## 2018-11-19 NOTE — Patient Instructions (Addendum)
  Good to see you today! We'll check back in at the end of June to see how mood is doing.  Let's restart metformin once a day and then end of August we'll check A1C. Continue walking for exercise and hopefully gyms will get back open soon!  If you have questions or concerns please do not hesitate to call at [phone].  Lucila Maine, DO PGY-3, [facility] 11/19/2018 10:41 AM

## 2018-11-19 NOTE — Progress Notes (Signed)
    Subjective:    Patient ID: [name], female    DOB: 07-22-1955, 63 y.o.   MRN: [id number]   CC: depressed mood  HPI:  Patient mentioned depression was poorly controlled during AWV. Her zoloft was increased to 100 mg from 75. She reports noticing an improvement since the increase but wants to give it more time to see the full effects. No SI/HI.  She also is due for DM check up. She had bariatric surgery about 5 years ago and this enabled her to discontinue her DM meds. Prior to that she was on metformin and insulin. She is walking for exercise currently, prior to pandemic she was quite active at the gym going almost daily and doing the elliptical for an hour or more. She is hopeful to get back to this once gyms reopen. She is hesitant to add more pills to her regimen as she was happy with the decreased pill burden however states she will do what it takes to control DM. No increased thirst, hunger, urination.  Smoking status reviewed- former smoker quit 1996  Review of Systems- see HPI   Objective:  BP 114/60   Pulse 64   SpO2 94%  Vitals and nursing note reviewed  General: well nourished, in no acute distress HEENT: normocephalic, MMM Cardiac: RRR, clear S1 and S2, no murmurs, rubs, or gallops Respiratory: clear to auscultation bilaterally, no increased work of breathing Neuro: alert and oriented, no focal deficits Psych: mood and affect congruent   Assessment & Plan:    Type 2 diabetes, controlled, with neuropathy (HCC)  Restart metformin 1000 mg daily, repeat A1C 3 months. Discussed dietary changes. Praised current exercise efforts (walking) and patient voiced strong motivation to return to gym once this reopens.  Chronic depression  phq9 = 3 and Gad7 = 1 today. Improved with increased dose of zoloft. Continue zoloft 100 mg daily, follow up 1 month to assess mood.     Return in about 4 weeks (around 12/17/2018) for mood .   Lucila Maine, DO Family Medicine  Resident PGY-3

## 2018-11-19 NOTE — Assessment & Plan Note (Signed)
  Restart metformin 1000 mg daily, repeat A1C 3 months. Discussed dietary changes. Praised current exercise efforts (walking) and patient voiced strong motivation to return to gym once this reopens.

## 2018-11-19 NOTE — Assessment & Plan Note (Signed)
  phq9 = 3 and Gad7 = 1 today. Improved with increased dose of zoloft. Continue zoloft 100 mg daily, follow up 1 month to assess mood.

## 2018-11-20 ENCOUNTER — Encounter: Payer: Self-pay | Admitting: Gastroenterology

## 2018-11-20 ENCOUNTER — Other Ambulatory Visit: Payer: Self-pay

## 2018-11-20 ENCOUNTER — Ambulatory Visit (INDEPENDENT_AMBULATORY_CARE_PROVIDER_SITE_OTHER): Payer: Medicare HMO | Admitting: Gastroenterology

## 2018-11-20 VITALS — Ht 68.0 in | Wt 224.0 lb

## 2018-11-20 DIAGNOSIS — Z86010 Personal history of colonic polyps: Secondary | ICD-10-CM | POA: Diagnosis not present

## 2018-11-20 DIAGNOSIS — Z800 Family history of malignant neoplasm of digestive organs: Secondary | ICD-10-CM | POA: Diagnosis not present

## 2018-11-20 NOTE — Progress Notes (Signed)
This patient contacted our office requesting a physician telemedicine consultation regarding clinical questions and/or test results. Interactive audio and video telecommunications were attempted between this provider and the patient.  However, this technology failed due to the patient having technical difficulties OR they did not have access to video capabilities.  We continued and completed the visit with audio only.   Participants on the conference : myself and patient   The patient consented to this consultation and was aware that a charge will be placed through their insurance.  I was in my office and the patient was at home   Encounter time:  Total time 23 minutes, with 18 minutes spent with patient on phone   _____________________________________________________________________________________________               [name] GI Progress Note  Chief Complaint: hx colon polyps, family history of colon cancer  Subjective  History: Last clinic visit March 2019 for chronic diarrhea after previous bariatric surgery including gastric bypass and duodenal switch.  She also had a subsequent abdominal wall hernia repair and possible revision of her bariatric procedure.  Bariatric surgeon maintains her on Gattex to control diarrhea.  12 mm tubular adenoma removed on last colonoscopy May 2017, family history of colon cancer.  Went to genetic counselor June 2019 and no Lynch mutations.  Discharge summary from surgical hospitalization August 2019 with Dr. Kreg Shropshire at [facility] indicates she had a diagnostic laparoscopy for abdominal pain that was described as "pretty much negative".  A small blind loop was resected. Had a TKR January, then rehab. Had a couple dark bowel movements earlier this week, then returned to normal. Now on apixiban since I last saw her. Reports A fib Dx last year. Last cardiology note available in epic 04/2018 from Dr. Darrin Nipper with [facility].  Chronic  diarrhea as before from short bowel syndrome.  Generally managed well on her injection.  ROS: Cardiovascular:  no chest pain Respiratory: no dyspnea Depression generally well controlled on Zoloft.  The patient's Past Medical, Family and Social History were reviewed and are on file in the EMR.  Objective:  Med list reviewed  Current Outpatient Medications:  .  acetaminophen (TYLENOL) 325 MG tablet, Take 1-2 tablets (325-650 mg total) by mouth every 6 (six) hours as needed for mild pain (pain score 1-3 or temp > 100.5)., Disp: 60 tablet, Rfl: 0 .  amLODipine (NORVASC) 10 MG tablet, Take 1 tablet (10 mg total) by mouth daily., Disp: 90 tablet, Rfl: 0 .  apixaban (ELIQUIS) 5 MG TABS tablet, Take 1 tablet (5 mg total) by mouth 2 (two) times daily., Disp: 60 tablet, Rfl: 0 .  atorvastatin (LIPITOR) 40 MG tablet, TAKE 1 TABLET BY MOUTH EVERY DAY, Disp: 90 tablet, Rfl: 0 .  gabapentin (NEURONTIN) 100 MG capsule, Take 1 capsule (100 mg total) by mouth 2 (two) times daily., Disp: 180 capsule, Rfl: 1 .  glucose blood (ACCU-CHEK AVIVA PLUS) test strip, Use to check sugar once daily in the morning, Disp: 100 each, Rfl: 0 .  lisinopril-hydrochlorothiazide (PRINZIDE,ZESTORETIC) 20-25 MG tablet, TAKE 1 TABLET BY MOUTH EVERY DAY, Disp: 90 tablet, Rfl: 3 .  metFORMIN (GLUCOPHAGE) 1000 MG tablet, Take 1 tablet (1,000 mg total) by mouth daily with breakfast., Disp: 90 tablet, Rfl: 2 .  methocarbamol (ROBAXIN) 500 MG tablet, Take 1 tablet (500 mg total) by mouth every 6 (six) hours as needed for muscle spasms., Disp: 120 tablet, Rfl: 0 .  multivitamin-iron-minerals-folic acid (CENTRUM) chewable tablet, Chew  1 tablet by mouth daily. , Disp: , Rfl:  .  NON FORMULARY, Diet type:  NAS, Disp: , Rfl:  .  sertraline (ZOLOFT) 100 MG tablet, Take 1 tablet (100 mg total) by mouth daily., Disp: 90 tablet, Rfl: 1 .  Teduglutide, rDNA, (GATTEX) 5 MG KIT, Inject 5 mg into the skin daily., Disp: , Rfl:     No exam -  virtual visit   Recent Hgb A1c = 8.1  CMP Latest Ref Rng & Units 06/19/2018 04/29/2018 12/03/2017  Glucose 70 - 99 mg/dL 139(H) 89 146(H)  BUN 8 - 23 mg/dL _0 Creatinine 0.44 - 1.00 mg/dL 1.33(H) 1.06(H) 1.01(H)  Sodium 135 - 145 mmol/L 140 145(H) 145(H)  Potassium 3.5 - 5.1 mmol/L 4.4 4.6 4.4  Chloride 98 - 111 mmol/L 108 103 108(H)  CO2 22 - 32 mmol/L _1 Calcium 8.9 - 10.3 mg/dL 10.4(H) 10.1 10.8(H)  Total Protein g/dL - - -  Total Bilirubin 0.3 - 1.2 mg/dL - - -  Alkaline Phos - - - -  AST 15 - 41 U/L - - -  ALT - - - -    _2 @ Assessment: Encounter Diagnoses  Name Primary?  . Personal history of colonic polyps Yes  . Family history of colon cancer    Due for surveillance colonoscopy.   Plan: Our clinical staff will contact her to make arrangements for colonoscopy with me.  We will also contact her cardiology practice to be sure they agree we can hold Eliquis 2 days prior to procedure.  I suspect they will be agreeable since she was able to do so before her knee replacement earlier this year.    [name]

## 2018-11-23 MED ORDER — NA SULFATE-K SULFATE-MG SULF 17.5-3.13-1.6 GM/177ML PO SOLN: 1.0000 | Freq: Once | ORAL | 0 refills | Status: AC

## 2018-11-23 NOTE — Addendum Note (Signed)
Addended by: Elias Else on: 11/23/2018 10:27 AM   Modules accepted: Orders

## 2018-11-25 ENCOUNTER — Other Ambulatory Visit: Payer: Self-pay

## 2018-11-25 ENCOUNTER — Telehealth: Payer: Self-pay

## 2018-11-25 NOTE — Telephone Encounter (Addendum)
Eliquis medication management letter sent to Dr. Dion Saucier on 11-25-2018.

## 2018-11-26 NOTE — Telephone Encounter (Addendum)
Response received from Dr Delia Chimes office. Ok to hold Eliquis 2 days prior to colonoscopy. Pt has been notified and aware. she states clear understanding.

## 2018-11-30 NOTE — Progress Notes (Signed)
Can we please draft a letter to be faxed to Dr. Corena Pilgrim office at [phone] stating patient is cleared to stop plavix 5 days before her scheduled colonoscopy. Colonoscopy scheduled for 6/25, patient can stop plavix 6/20 and resume after colonoscopy.  Thank you  Lucila Maine, DO PGY-3, [facility] 11/30/2018 12:23 AM

## 2018-12-01 NOTE — Progress Notes (Signed)
Letter faxed.  Siddarth Hsiung,CMA

## 2018-12-08 DIAGNOSIS — R197 Diarrhea, unspecified: Secondary | ICD-10-CM | POA: Diagnosis not present

## 2018-12-08 DIAGNOSIS — K909 Intestinal malabsorption, unspecified: Secondary | ICD-10-CM | POA: Diagnosis not present

## 2018-12-08 DIAGNOSIS — Z6836 Body mass index (BMI) 36.0-36.9, adult: Secondary | ICD-10-CM | POA: Diagnosis not present

## 2018-12-15 DIAGNOSIS — I10 Essential (primary) hypertension: Secondary | ICD-10-CM | POA: Diagnosis not present

## 2018-12-15 DIAGNOSIS — G4733 Obstructive sleep apnea (adult) (pediatric): Secondary | ICD-10-CM | POA: Diagnosis not present

## 2018-12-15 DIAGNOSIS — G4710 Hypersomnia, unspecified: Secondary | ICD-10-CM | POA: Diagnosis not present

## 2018-12-16 ENCOUNTER — Telehealth: Payer: Self-pay | Admitting: Gastroenterology

## 2018-12-16 NOTE — Telephone Encounter (Signed)
°  Spoke w/patient regarding Covid-19 Screening Questions:  Do you now or have you had a fever in the last 14 days? no  Do you have any respiratory symptoms of shortness of breath or cough now or in the last 14 days? no  Do you have any family members or close contacts with diagnosed or suspected Covid-19 in the past 14 days? no  Have you been tested for Covid-19 and found to be positive? no   Pt made aware of that care partner may wait in the car or come up to the lobby during the procedure but will need to provide their own mask.

## 2018-12-17 ENCOUNTER — Encounter: Payer: Self-pay | Admitting: Gastroenterology

## 2018-12-17 ENCOUNTER — Other Ambulatory Visit: Payer: Self-pay

## 2018-12-17 ENCOUNTER — Ambulatory Visit (AMBULATORY_SURGERY_CENTER): Payer: Medicare HMO | Admitting: Gastroenterology

## 2018-12-17 VITALS — BP 119/47 | HR 45 | Temp 98.4°F | Resp 12 | Ht 68.0 in | Wt 224.0 lb

## 2018-12-17 DIAGNOSIS — Z86010 Personal history of colonic polyps: Secondary | ICD-10-CM

## 2018-12-17 DIAGNOSIS — D123 Benign neoplasm of transverse colon: Secondary | ICD-10-CM | POA: Diagnosis not present

## 2018-12-17 DIAGNOSIS — Z1211 Encounter for screening for malignant neoplasm of colon: Secondary | ICD-10-CM | POA: Diagnosis not present

## 2018-12-17 DIAGNOSIS — D125 Benign neoplasm of sigmoid colon: Secondary | ICD-10-CM

## 2018-12-17 DIAGNOSIS — Z800 Family history of malignant neoplasm of digestive organs: Secondary | ICD-10-CM

## 2018-12-17 DIAGNOSIS — K635 Polyp of colon: Secondary | ICD-10-CM

## 2018-12-17 MED ORDER — SODIUM CHLORIDE 0.9 % IV SOLN: 500.0000 mL | Freq: Once | INTRAVENOUS | Status: DC

## 2018-12-17 NOTE — Progress Notes (Signed)
[place]

## 2018-12-17 NOTE — Progress Notes (Signed)
Pt's states no medical or surgical changes since previsit or office visit. 

## 2018-12-17 NOTE — Op Note (Addendum)
[facility] Patient Name: [name] Procedure Date: 12/17/2018 10:51 AM MRN: [id number] Endoscopist: Ciales. Loletha Carrow , MD Age: 63 Referring MD:  Date of Birth: 25-Oct-1955 Gender: Female Account #: [id number] Procedure:                Colonoscopy Indications:              Surveillance: Personal history of adenomatous                            polyps on last colonoscopy 3 years ago (73m TA;                            10/2015) Medicines:                Monitored Anesthesia Care Procedure:                Pre-Anesthesia Assessment:                           - Prior to the procedure, a History and Physical                            was performed, and patient medications and                            allergies were reviewed. The patient's tolerance of                            previous anesthesia was also reviewed. The risks                            and benefits of the procedure and the sedation                            options and risks were discussed with the patient.                            All questions were answered, and informed consent                            was obtained. Prior Anticoagulants: The patient has                            taken Eliquis (apixaban), last dose was 2 days                            prior to procedure. ASA Grade Assessment: III - A                            patient with severe systemic disease. After                            reviewing the risks and benefits, the patient was  deemed in satisfactory condition to undergo the                            procedure.                           After obtaining informed consent, the colonoscope                            was passed under direct vision. Throughout the                            procedure, the patient's blood pressure, pulse, and                            oxygen saturations were monitored continuously. The                            Colonoscope was  introduced through the anus and                            advanced to the the cecum, identified by                            appendiceal orifice and ileocecal valve. The                            colonoscopy was somewhat difficult due to a                            redundant colon. Successful completion of the                            procedure was aided by using manual pressure. The                            patient tolerated the procedure well. The quality                            of the bowel preparation was good. The ileocecal                            valve, appendiceal orifice, and rectum were                            photographed. Scope In: 10:57:54 AM Scope Out: 11:17:06 AM Scope Withdrawal Time: 0 hours 13 minutes 5 seconds  Total Procedure Duration: 0 hours 19 minutes 12 seconds  Findings:                 The perianal and digital rectal examinations were                            normal.  Two sessile polyps were found in the distal sigmoid                            colon and transverse colon. The polyps were                            diminutive in size. These polyps were removed with                            a cold snare. Resection and retrieval were complete.                           There were two medium-sized lipoms in the ascending                            colon. Complications:            No immediate complications. Estimated Blood Loss:     Estimated blood loss was minimal. Impression:               - Two diminutive polyps in the distal sigmoid colon                            and in the transverse colon, removed with a cold                            snare. Resected and retrieved.                           - Medium-sized lipoma in the ascending colon. Recommendation:           - Patient has a contact number available for                            emergencies. The signs and symptoms of potential                             delayed complications were discussed with the                            patient. Return to normal activities tomorrow.                            Written discharge instructions were provided to the                            patient.                           - Resume previous diet.                           - Resume Eliquis (apixaban) at prior dose today.                           - Await  pathology results.                           - Repeat colonoscopy in 5 years for surveillance. Henry L. Loletha Carrow, MD 12/17/2018 11:26:08 AM This report has been signed electronically.

## 2018-12-17 NOTE — Patient Instructions (Addendum)
YOU HAD AN ENDOSCOPIC PROCEDURE TODAY AT [facility]:   Refer to the procedure report that was given to you for any specific questions about what was found during the examination.  If the procedure report does not answer your questions, please call your gastroenterologist to clarify.  If you requested that your care partner not be given the details of your procedure findings, then the procedure report has been included in a sealed envelope for you to review at your convenience later.  YOU SHOULD EXPECT: Some feelings of bloating in the abdomen. Passage of more gas than usual.  Walking can help get rid of the air that was put into your GI tract during the procedure and reduce the bloating. If you had a lower endoscopy (such as a colonoscopy or flexible sigmoidoscopy) you may notice spotting of blood in your stool or on the toilet paper. If you underwent a bowel prep for your procedure, you may not have a normal bowel movement for a few days.  Please Note:  You might notice some irritation and congestion in your nose or some drainage.  This is from the oxygen used during your procedure.  There is no need for concern and it should clear up in a day or so.  SYMPTOMS TO REPORT IMMEDIATELY:   Following lower endoscopy (colonoscopy or flexible sigmoidoscopy):  Excessive amounts of blood in the stool  Significant tenderness or worsening of abdominal pains  Swelling of the abdomen that is new, acute  Fever of 100F or higher   For urgent or emergent issues, a gastroenterologist can be reached at any hour by calling [phone].   DIET:  We do recommend a small meal at first, but then you may proceed to your regular diet.  Drink plenty of fluids but you should avoid alcoholic beverages for 24 hours.  ACTIVITY:  You should plan to take it easy for the rest of today and you should NOT DRIVE or use heavy machinery until tomorrow (because of the sedation medicines used during the test).     FOLLOW UP: Our staff will call the number listed on your records 48-72 hours following your procedure to check on you and address any questions or concerns that you may have regarding the information given to you following your procedure. If we do not reach you, we will leave a message.  We will attempt to reach you two times.  During this call, we will ask if you have developed any symptoms of COVID 19. If you develop any symptoms (ie: fever, flu-like symptoms, shortness of breath, cough etc.) before then, please call [phone].  If you test positive for Covid 19 in the 2 weeks post procedure, please call and report this information to [place].    If any biopsies were taken you will be contacted by phone or by letter within the next 1-3 weeks.  Please call us at [phone] if you have not heard about the biopsies in 3 weeks.    SIGNATURES/CONFIDENTIALITY: You and/or your care partner have signed paperwork which will be entered into your electronic medical record.  These signatures attest to the fact that that the information above on your After Visit Summary has been reviewed and is understood.  Full responsibility of the confidentiality of this discharge information lies with you and/or your care-partner.   Handout was given to you partner on polyps. Your blood sugar was 150 in the recovery room. Restart your ELIQUIS at prior dose TODAY. You  may resume your other current medications today. Await biopsy results. Please call if any questions or concerns.

## 2018-12-17 NOTE — Progress Notes (Signed)
No problems noted in the recovery room.

## 2018-12-17 NOTE — Progress Notes (Signed)
A and O x3. Report to RN. Tolerated MAC anesthesia well.

## 2018-12-17 NOTE — Progress Notes (Signed)
Called to room to assist during endoscopic procedure.  Patient ID and intended procedure confirmed with present staff. Received instructions for my participation in the procedure from the performing physician.  

## 2018-12-18 ENCOUNTER — Other Ambulatory Visit: Payer: Self-pay

## 2018-12-18 ENCOUNTER — Ambulatory Visit (INDEPENDENT_AMBULATORY_CARE_PROVIDER_SITE_OTHER): Payer: Medicare HMO | Admitting: Family Medicine

## 2018-12-18 ENCOUNTER — Encounter: Payer: Self-pay | Admitting: Family Medicine

## 2018-12-18 VITALS — BP 142/66 | HR 77

## 2018-12-18 DIAGNOSIS — F32A Depression, unspecified: Secondary | ICD-10-CM

## 2018-12-18 DIAGNOSIS — F329 Major depressive disorder, single episode, unspecified: Secondary | ICD-10-CM

## 2018-12-18 DIAGNOSIS — E669 Obesity, unspecified: Secondary | ICD-10-CM | POA: Diagnosis not present

## 2018-12-18 NOTE — Progress Notes (Signed)
    Subjective:    Patient ID: [name], female    DOB: October 17, 1955, 63 y.o.   MRN: [id number]   CC: depressed mood  HPI: On 100 mg zoloft, feels like it's working well enough. She wants to keep this dose and follow up at her next visit to see how she's doing.  Overall she is doing well, she is motivated to lose weight. She spoke with her bariatric surgeon and plans to restrict calories. She also plans to go back to the gym which has reopened and is motivated to get back to using the elliptical  Smoking status reviewed- former smoker  Review of Systems- no SI/HI   Objective:  BP (!) 142/66   Pulse 77   SpO2 97%  Vitals and nursing note reviewed  General: well nourished, in no acute distress HEENT: normocephalic, MMM Cardiac: RRR, clear S1 and S2, no murmurs, rubs, or gallops Respiratory: clear to auscultation bilaterally, no increased work of breathing Extremities: no edema or cyanosis. Neuro: alert and oriented, no focal deficits Psych: mood is hopeful, affect normal  Assessment & Plan:   1. Chronic depression Continue zoloft 100 mg every day, from PHQ9 and GAD well controlled. Patient reports well controlled at this time. Follow up 2 months  2. Obesity (BMI 30.0-34.9) Discussed calorie counting for weight loss. Recommended myfitnesspal app.    Return in about 2 months (around 02/17/2019).   Lucila Maine, DO Family Medicine Resident PGY-3

## 2018-12-18 NOTE — Patient Instructions (Signed)
Great to see you today! We'll see you back in about 2 months to check diabetes and mood.  Keep up the great motivation to improve your health. I'm proud of you!  If you have questions or concerns please do not hesitate to call at [phone].  Lucila Maine, DO PGY-3, [facility] 12/18/2018 10:47 AM   Calorie Counting for Weight Loss Calories are units of energy. Your body needs a certain amount of calories from food to keep you going throughout the day. When you eat more calories than your body needs, your body stores the extra calories as fat. When you eat fewer calories than your body needs, your body burns fat to get the energy it needs. Calorie counting means keeping track of how many calories you eat and drink each day. Calorie counting can be helpful if you need to lose weight. If you make sure to eat fewer calories than your body needs, you should lose weight. Ask your health care provider what a healthy weight is for you. For calorie counting to work, you will need to eat the right number of calories in a day in order to lose a healthy amount of weight per week. A dietitian can help you determine how many calories you need in a day and will give you suggestions on how to reach your calorie goal.  A healthy amount of weight to lose per week is usually 1-2 lb (0.5-0.9 kg). This usually means that your daily calorie intake should be reduced by 500-750 calories.  Eating 1,200 - 1,500 calories per day can help most women lose weight.  Eating 1,500 - 1,800 calories per day can help most men lose weight. What is my plan? My goal is to have __________ calories per day. If I have this many calories per day, I should lose around __________ pounds per week. What do I need to know about calorie counting? In order to meet your daily calorie goal, you will need to:  Find out how many calories are in each food you would like to eat. Try to do this before you eat.  Decide how  much of the food you plan to eat.  Write down what you ate and how many calories it had. Doing this is called keeping a food log. To successfully lose weight, it is important to balance calorie counting with a healthy lifestyle that includes regular activity. Aim for 150 minutes of moderate exercise (such as walking) or 75 minutes of vigorous exercise (such as running) each week. Where do I find calorie information?  The number of calories in a food can be found on a Nutrition Facts label. If a food does not have a Nutrition Facts label, try to look up the calories online or ask your dietitian for help. Remember that calories are listed per serving. If you choose to have more than one serving of a food, you will have to multiply the calories per serving by the amount of servings you plan to eat. For example, the label on a package of bread might say that a serving size is 1 slice and that there are 90 calories in a serving. If you eat 1 slice, you will have eaten 90 calories. If you eat 2 slices, you will have eaten 180 calories. How do I keep a food log? Immediately after each meal, record the following information in your food log:  What you ate. Don't forget to include toppings, sauces, and other extras on the  food.  How much you ate. This can be measured in cups, ounces, or number of items.  How many calories each food and drink had.  The total number of calories in the meal. Keep your food log near you, such as in a small notebook in your pocket, or use a mobile app or website. Some programs will calculate calories for you and show you how many calories you have left for the day to meet your goal. What are some calorie counting tips?   Use your calories on foods and drinks that will fill you up and not leave you hungry: ? Some examples of foods that fill you up are nuts and nut butters, vegetables, lean proteins, and high-fiber foods like whole grains. High-fiber foods are foods with more  than 5 g fiber per serving. ? Drinks such as sodas, specialty coffee drinks, alcohol, and juices have a lot of calories, yet do not fill you up.  Eat nutritious foods and avoid empty calories. Empty calories are calories you get from foods or beverages that do not have many vitamins or protein, such as candy, sweets, and soda. It is better to have a nutritious high-calorie food (such as an avocado) than a food with few nutrients (such as a bag of chips).  Know how many calories are in the foods you eat most often. This will help you calculate calorie counts faster.  Pay attention to calories in drinks. Low-calorie drinks include water and unsweetened drinks.  Pay attention to nutrition labels for "low fat" or "fat free" foods. These foods sometimes have the same amount of calories or more calories than the full fat versions. They also often have added sugar, starch, or salt, to make up for flavor that was removed with the fat.  Find a way of tracking calories that works for you. Get creative. Try different apps or programs if writing down calories does not work for you. What are some portion control tips?  Know how many calories are in a serving. This will help you know how many servings of a certain food you can have.  Use a measuring cup to measure serving sizes. You could also try weighing out portions on a kitchen scale. With time, you will be able to estimate serving sizes for some foods.  Take some time to put servings of different foods on your favorite plates, bowls, and cups so you know what a serving looks like.  Try not to eat straight from a bag or box. Doing this can lead to overeating. Put the amount you would like to eat in a cup or on a plate to make sure you are eating the right portion.  Use smaller plates, glasses, and bowls to prevent overeating.  Try not to multitask (for example, watch TV or use your computer) while eating. If it is time to eat, sit down at a table and  enjoy your food. This will help you to know when you are full. It will also help you to be aware of what you are eating and how much you are eating. What are tips for following this plan? Reading food labels  Check the calorie count compared to the serving size. The serving size may be smaller than what you are used to eating.  Check the source of the calories. Make sure the food you are eating is high in vitamins and protein and low in saturated and trans fats. Shopping  Read nutrition labels while you shop. This  will help you make healthy decisions before you decide to purchase your food.  Make a grocery list and stick to it. Cooking  Try to cook your favorite foods in a healthier way. For example, try baking instead of frying.  Use low-fat dairy products. Meal planning  Use more fruits and vegetables. Half of your plate should be fruits and vegetables.  Include lean proteins like poultry and fish. How do I count calories when eating out?  Ask for smaller portion sizes.  Consider sharing an entree and sides instead of getting your own entree.  If you get your own entree, eat only half. Ask for a box at the beginning of your meal and put the rest of your entree in it so you are not tempted to eat it.  If calories are listed on the menu, choose the lower calorie options.  Choose dishes that include vegetables, fruits, whole grains, low-fat dairy products, and lean protein.  Choose items that are boiled, broiled, grilled, or steamed. Stay away from items that are buttered, battered, fried, or served with cream sauce. Items labeled "crispy" are usually fried, unless stated otherwise.  Choose water, low-fat milk, unsweetened iced tea, or other drinks without added sugar. If you want an alcoholic beverage, choose a lower calorie option such as a glass of wine or light beer.  Ask for dressings, sauces, and syrups on the side. These are usually high in calories, so you should limit  the amount you eat.  If you want a salad, choose a garden salad and ask for grilled meats. Avoid extra toppings like bacon, cheese, or fried items. Ask for the dressing on the side, or ask for olive oil and vinegar or lemon to use as dressing.  Estimate how many servings of a food you are given. For example, a serving of cooked rice is  cup or about the size of half a baseball. Knowing serving sizes will help you be aware of how much food you are eating at restaurants. The list below tells you how big or small some common portion sizes are based on everyday objects: ? 1 oz--4 stacked dice. ? 3 oz--1 deck of cards. ? 1 tsp--1 die. ? 1 Tbsp-- a ping-pong ball. ? 2 Tbsp--1 ping-pong ball. ?  cup-- baseball. ? 1 cup--1 baseball. Summary  Calorie counting means keeping track of how many calories you eat and drink each day. If you eat fewer calories than your body needs, you should lose weight.  A healthy amount of weight to lose per week is usually 1-2 lb (0.5-0.9 kg). This usually means reducing your daily calorie intake by 500-750 calories.  The number of calories in a food can be found on a Nutrition Facts label. If a food does not have a Nutrition Facts label, try to look up the calories online or ask your dietitian for help.  Use your calories on foods and drinks that will fill you up, and not on foods and drinks that will leave you hungry.  Use smaller plates, glasses, and bowls to prevent overeating. This information is not intended to replace advice given to you by your health care provider. Make sure you discuss any questions you have with your health care provider. Document Released: 06/10/2005 Document Revised: 02/27/2018 Document Reviewed: 05/10/2016 Elsevier Patient Education  2020 [employer].

## 2018-12-21 ENCOUNTER — Telehealth: Payer: Self-pay | Admitting: *Deleted

## 2018-12-21 ENCOUNTER — Encounter: Payer: Self-pay | Admitting: Gastroenterology

## 2018-12-21 NOTE — Telephone Encounter (Signed)
  Follow up Call-  Call back number 12/17/2018  Post procedure Call Back phone  # [phone]  Permission to leave phone message Yes  Some recent data might be hidden     Patient questions:  Do you have a fever, pain , or abdominal swelling? No. Pain Score  0 *  Have you tolerated food without any problems? Yes.    Have you been able to return to your normal activities? Yes.    Do you have any questions about your discharge instructions: Diet   No. Medications  No. Follow up visit  No.  Do you have questions or concerns about your Care? No.  Actions: * If pain score is 4 or above: No action needed, pain <4.  1. Have you developed a fever since your procedure? NO  2.   Have you had an respiratory symptoms (SOB or cough) since your procedure?  3.   Have you tested positive for COVID 19 since your procedure NO  4.   Have you had any family members/close contacts diagnosed with the COVID 19 since your procedure?  NO   If yes to any of these questions please route to Joylene John, RN and Alphonsa Gin, RN.

## 2019-01-12 NOTE — Telephone Encounter (Signed)
This encounter was created in error - please disregard.

## 2019-01-14 DIAGNOSIS — I10 Essential (primary) hypertension: Secondary | ICD-10-CM | POA: Diagnosis not present

## 2019-01-14 DIAGNOSIS — G4733 Obstructive sleep apnea (adult) (pediatric): Secondary | ICD-10-CM | POA: Diagnosis not present

## 2019-01-14 DIAGNOSIS — G4710 Hypersomnia, unspecified: Secondary | ICD-10-CM | POA: Diagnosis not present

## 2019-01-25 DIAGNOSIS — Z9884 Bariatric surgery status: Secondary | ICD-10-CM | POA: Diagnosis not present

## 2019-01-25 DIAGNOSIS — Z6836 Body mass index (BMI) 36.0-36.9, adult: Secondary | ICD-10-CM | POA: Diagnosis not present

## 2019-01-29 ENCOUNTER — Ambulatory Visit: Payer: Medicare HMO | Admitting: Family Medicine

## 2019-02-10 ENCOUNTER — Ambulatory Visit (INDEPENDENT_AMBULATORY_CARE_PROVIDER_SITE_OTHER): Payer: Medicare HMO | Admitting: Vascular Surgery

## 2019-02-10 ENCOUNTER — Encounter: Payer: Self-pay | Admitting: Vascular Surgery

## 2019-02-10 ENCOUNTER — Other Ambulatory Visit: Payer: Self-pay

## 2019-02-10 VITALS — BP 140/74 | HR 66 | Temp 97.6°F | Resp 16 | Ht 68.0 in | Wt 224.0 lb

## 2019-02-10 DIAGNOSIS — I83811 Varicose veins of right lower extremities with pain: Secondary | ICD-10-CM | POA: Diagnosis not present

## 2019-02-10 HISTORY — PX: OTHER SURGICAL HISTORY: SHX169
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
## 2019-02-14 DIAGNOSIS — I10 Essential (primary) hypertension: Secondary | ICD-10-CM | POA: Diagnosis not present

## 2019-02-14 DIAGNOSIS — G4710 Hypersomnia, unspecified: Secondary | ICD-10-CM | POA: Diagnosis not present

## 2019-02-14 DIAGNOSIS — G4733 Obstructive sleep apnea (adult) (pediatric): Secondary | ICD-10-CM | POA: Diagnosis not present

## 2019-02-16 DIAGNOSIS — H5210 Myopia, unspecified eye: Secondary | ICD-10-CM | POA: Diagnosis not present

## 2019-02-16 LAB — HM DIABETES EYE EXAM

## 2019-02-19 ENCOUNTER — Ambulatory Visit (INDEPENDENT_AMBULATORY_CARE_PROVIDER_SITE_OTHER): Payer: Medicare HMO | Admitting: Family Medicine

## 2019-02-19 ENCOUNTER — Other Ambulatory Visit: Payer: Self-pay

## 2019-02-19 ENCOUNTER — Encounter: Payer: Self-pay | Admitting: Family Medicine

## 2019-02-19 VITALS — BP 112/64 | HR 71 | Wt 232.6 lb

## 2019-02-19 DIAGNOSIS — Z23 Encounter for immunization: Secondary | ICD-10-CM | POA: Diagnosis not present

## 2019-02-19 DIAGNOSIS — F329 Major depressive disorder, single episode, unspecified: Secondary | ICD-10-CM

## 2019-02-19 DIAGNOSIS — F32A Depression, unspecified: Secondary | ICD-10-CM

## 2019-02-19 DIAGNOSIS — E1140 Type 2 diabetes mellitus with diabetic neuropathy, unspecified: Secondary | ICD-10-CM

## 2019-02-19 LAB — POCT GLYCOSYLATED HEMOGLOBIN (HGB A1C): HbA1c, POC (controlled diabetic range): 7.7 % — AB (ref 0.0–7.0)

## 2019-02-19 NOTE — Progress Notes (Signed)
   Subjective:    Patient ID: [name], female    DOB: 18-Jun-1956, 63 y.o.   MRN: [id number]   CC: Diabetes and depression follow-up  HPI: Diabetes: A1c improved from 8.1 down to 7.7%!  Patient has been exercising at the gym and calorie restricting.  States everything is going well.  This recently, she has not been able to go to the gym due to recent right leg procedure.  She denies fevers, headaches, chest pain, shortness of breath, nausea, vomiting, polyuria, polydipsia, neuropathies, and myalgias.  Depression: PHQ-9 score 0.  Patient has been taking Zoloft 100 mg daily.  Also helping her depression is her ability to go to the gym.  Every now and then, her husband has a bout of depression which also brings her down, but otherwise she states everything is going very well.  Denies suicidal and homicidal ideation. Smoking status reviewed - former smoker  Review of Systems: See HPI   Objective:  BP 112/64   Pulse 71   Wt 232 lb 9.6 oz (105.5 kg)   SpO2 97%   BMI 35.37 kg/m  Vitals and nursing note reviewed  General: well nourished, in no acute distress Cardiac: RRR, clear S1 and S2, no murmurs, rubs, or gallops Respiratory: CTA bilaterally, no increased work of breathing Abdomen: soft, nontender, Bowel sounds present Extremities: no edema or cyanosis. Warm, well perfused.  Right lower extremity wrapped in compression stocking S/P recent procedure. Neuro: alert and oriented, no focal deficits  Assessment & Plan:   Type 2 diabetes, controlled, with neuropathy ([place]) Patient A1c improved from 8.1 down to 7.7%.  Goal 7-8%. Patient continues to take metformin 1000 mg every morning.  States she has difficulty swallowing the pill. -Patient encouraged to continue to take metformin 1000 mg every morning -Encouraged continued physical activity at the gym -Information from [facility] given to patient regarding carb and calorie counting. -Follow-up 3 months for A1c and weight  check.  Chronic depression PHQ 9 equals 0.  Patient taking Zoloft 100 mg regularly. -Continue Zoloft 100 mg daily -We will follow-up in 3 months  Health maintenance: Patient received flu shot today.  Return in about 3 months (around 05/22/2019) for T2DM check.   Dr. Milus Banister [facility], PGY-2

## 2019-02-19 NOTE — Assessment & Plan Note (Signed)
Patient A1c improved from 8.1 down to 7.7%.  Goal 7-8%. Patient continues to take metformin 1000 mg every morning.  States she has difficulty swallowing the pill. -Patient encouraged to continue to take metformin 1000 mg every morning -Encouraged continued physical activity at the gym -Information from [facility] given to patient regarding carb and calorie counting. -Follow-up 3 months for A1c and weight check.

## 2019-02-19 NOTE — Patient Instructions (Addendum)
Thank you for coming in to see [place] today! Please see below to review our plan for today's visit:  1. Half of what you eat should be vegetables! 2. Your A1c is down from 8.1 to 7.7%. 3. Check out [facility] for more information about carbohydrates in food (go to website --> Educational booklets --> click on the red meal planning/carb counting)  Please call the clinic at [phone] if your symptoms worsen or you have any concerns. It was our pleasure to serve you!  Dr. Milus Banister [facility]

## 2019-02-19 NOTE — Assessment & Plan Note (Signed)
PHQ 9 equals 0.  Patient taking Zoloft 100 mg regularly. -Continue Zoloft 100 mg daily -We will follow-up in 3 months

## 2019-03-11 ENCOUNTER — Ambulatory Visit (INDEPENDENT_AMBULATORY_CARE_PROVIDER_SITE_OTHER): Payer: Self-pay | Admitting: Vascular Surgery

## 2019-03-11 ENCOUNTER — Other Ambulatory Visit: Payer: Self-pay

## 2019-03-11 VITALS — BP 136/67 | HR 57 | Temp 97.3°F | Resp 20 | Ht 68.0 in | Wt 232.0 lb

## 2019-03-11 DIAGNOSIS — I83811 Varicose veins of right lower extremities with pain: Secondary | ICD-10-CM

## 2019-03-11 NOTE — Progress Notes (Signed)
Patient is a 63 year old female who returns for follow-up today.  She recently underwent stab avulsions of the right leg for control of pain symptoms from varicose veins.  She still has a couple areas of bruising in the leg but overall feels better.  Physical exam:  Vitals:   03/11/19 1018  BP: 136/67  Pulse: (!) 57  Resp: 20  Temp: (!) 97.3 F (36.3 C)  SpO2: 95%  Weight: 232 lb (105.2 kg)  Height: 5' 8"  (1.727 m)    Right lower extremity: Healing stab incisions one small hematoma about 2 x 2 cm on the medial aspect of the right leg, no erythema no drainage  Assessment: Improved status post stab phlebectomies right leg.    Plan: Patient will continue to wear knee-high compression stocking.  She will follow-up on an as-needed basis.  [name], MD [facility] Office: [phone] Pager: [phone]

## 2019-03-16 DIAGNOSIS — I10 Essential (primary) hypertension: Secondary | ICD-10-CM | POA: Diagnosis not present

## 2019-03-16 DIAGNOSIS — E785 Hyperlipidemia, unspecified: Secondary | ICD-10-CM | POA: Diagnosis not present

## 2019-03-16 DIAGNOSIS — I2510 Atherosclerotic heart disease of native coronary artery without angina pectoris: Secondary | ICD-10-CM | POA: Diagnosis not present

## 2019-03-16 DIAGNOSIS — R001 Bradycardia, unspecified: Secondary | ICD-10-CM | POA: Diagnosis not present

## 2019-03-30 DIAGNOSIS — Z9884 Bariatric surgery status: Secondary | ICD-10-CM | POA: Diagnosis not present

## 2019-03-31 ENCOUNTER — Telehealth: Payer: Self-pay | Admitting: Orthopedic Surgery

## 2019-03-31 NOTE — Telephone Encounter (Signed)
Please advise. Thanks.  

## 2019-03-31 NOTE — Telephone Encounter (Signed)
appt scheduled for her.

## 2019-03-31 NOTE — Telephone Encounter (Signed)
Okay to come in for x-rays before surgery

## 2019-03-31 NOTE — Telephone Encounter (Signed)
Patient called wanting to be scheduled for right total knee.  Was last seen in April.  Had left total knee early January. Patient states she fell on the right knee while entering her home and landing on her stone floor this past July. She experienced bruising and swelling, but has gotten better.  Patient is asking if you feel it would be best for her to come in for xrays before scheduling the surgery, just to determine if there are any changes to the knee since last xrays were taken.  Please advise.  She is thinking that December 1st might be a possibility for a surgery date or even after the first of the year.   Patient's cb  [phone]

## 2019-04-01 ENCOUNTER — Other Ambulatory Visit: Payer: Self-pay

## 2019-04-01 DIAGNOSIS — Z20822 Contact with and (suspected) exposure to covid-19: Secondary | ICD-10-CM

## 2019-04-01 DIAGNOSIS — Z20828 Contact with and (suspected) exposure to other viral communicable diseases: Secondary | ICD-10-CM | POA: Diagnosis not present

## 2019-04-02 LAB — NOVEL CORONAVIRUS, NAA: SARS-CoV-2, NAA: NOT DETECTED

## 2019-04-14 ENCOUNTER — Other Ambulatory Visit: Payer: Self-pay

## 2019-04-14 ENCOUNTER — Ambulatory Visit (INDEPENDENT_AMBULATORY_CARE_PROVIDER_SITE_OTHER): Payer: Medicare HMO

## 2019-04-14 ENCOUNTER — Encounter: Payer: Self-pay | Admitting: Orthopedic Surgery

## 2019-04-14 ENCOUNTER — Ambulatory Visit (INDEPENDENT_AMBULATORY_CARE_PROVIDER_SITE_OTHER): Payer: Medicare HMO | Admitting: Orthopedic Surgery

## 2019-04-14 VITALS — Ht 68.0 in | Wt 230.0 lb

## 2019-04-14 DIAGNOSIS — M25561 Pain in right knee: Secondary | ICD-10-CM

## 2019-04-14 NOTE — Progress Notes (Signed)
Office Visit Note   Patient: [name]           Date of Birth: 10-26-1955           MRN: [id number] Visit Date: 04/14/2019 Requested by: [address], [address] [place],  [place] [address] PCP: Daisy Floro, DO  Subjective: No chief complaint on file.   HPI: [name] is a patient with right knee pain.  She is doing well from her left total knee replacement.  She had a fall in September and the pain has been worse since that time.  She has a history of venous surgery on the right-hand side.  She also has a history of bariatric surgery.              ROS: All systems reviewed are negative as they relate to the chief complaint within the history of present illness.  Patient denies  fevers or chills.   Assessment & Plan: Visit Diagnoses:  1. Right knee pain, unspecified chronicity     Plan: Impression is progressive right knee arthritis with rest pain night pain and pain with activities of daily living.  Patient desires right total knee replacement.  She is failed nonoperative measures including multiple injections and activity modification and strengthening.  Risk and benefits of total knee replacement are discussed include not limited to infection nerve vessel damage potential need for revision surgery.  Plan to match her left knee in terms of cuts and implant sizes.  All questions answered  Follow-Up Instructions: No follow-ups on file.   Orders:  Orders Placed This Encounter  Procedures   XR Knee 1-2 Views Right   No orders of the defined types were placed in this encounter.     Procedures: No procedures performed   Clinical Data: No additional findings.  Objective: Vital Signs: Ht 5' 8"  (1.727 m)    Wt 230 lb (104.3 kg)    BMI 34.97 kg/m   Physical Exam:   Constitutional: Patient appears well-developed HEENT:  Head: Normocephalic Eyes:EOM are normal Neck: Normal range of motion Cardiovascular: Normal rate Pulmonary/chest: Effort  normal Neurologic: Patient is alert Skin: Skin is warm Psychiatric: Patient has normal mood and affect    Ortho Exam: Ortho exam demonstrates full active and passive range of motion of the ankles and hips on the right-hand side.  She has good extension and flexion to about 105 on the right.  Pedal pulses palpable.  Extensor mechanism is intact.  Skin in the right knee region is intact.  No other masses lymphadenopathy or skin changes noted in that right knee region  Specialty Comments:  No specialty comments available.  Imaging: Xr Knee 1-2 Views Right  Result Date: 04/14/2019 AP lateral right knee reviewed.  Tricompartmental osteoarthritis is present worse in the lateral compartment.  Well aligned total knee prosthesis present on the left-hand side.  No acute fracture.  Spurring on the lateral side along with sclerosis is present    PMFS History: Patient Active Problem List   Diagnosis Date Noted   Unilateral primary osteoarthritis, left knee    Hypertension associated with diabetes ([place]) 07/11/2018   Dyslipidemia associated with type 2 diabetes mellitus ([place]) 07/11/2018   Short bowel syndrome 07/11/2018   Peripheral autonomic neuropathy due to diabetes mellitus ([place]) 07/11/2018   Status post left knee replacement 07/11/2018   Arthritis of knee 06/30/2018   Mild cognitive impairment 04/30/2018   Venous stasis dermatitis of right lower extremity 01/02/2018  Genetic testing 11/24/2017   Family history of colon cancer    B12 deficiency 12/13/2016   Malabsorption 03/12/2016   Tubular adenoma of colon 03/09/2016   Vitamin D deficiency 03/09/2016   Rectovaginal fistula 05/25/2014   S/P bariatric surgery-duodenal switch with sleeve gastrectomy 12/04/2013   Nonobstructive CAD  08/26/2013   Iron deficiency anemia 06/19/2012   Low back pain 03/05/2012   Primary osteoarthritis of left knee 04/23/2011   UNSPECIFIED VENOUS INSUFFICIENCY 05/28/2010   PSORIASIS  05/28/2010   HYPERCHOLESTEROLEMIA 03/20/2010   Essential hypertension 03/20/2010   Chronic dermatitis of hands 05/02/2009   Peripheral neuropathy 01/12/2009   Type 2 diabetes, controlled, with neuropathy ([place]) 02/18/2008   Chronic depression 10/06/2007   OBSTRUCTIVE SLEEP APNEA 06/12/2007   GOITER, MULTINODULAR 05/13/2007   Past Medical History:  Diagnosis Date   Allergy    ANEMIA, PERNICIOUS, HX OF 05/13/2007   Anxiety    Arthritis    ASYMPTOMATIC POSTMENOPAUSAL STATUS 02/18/2008   BACK PAIN, LUMBAR 11/19/2007   Bowel incontinence    Cataract    Cellulitis of left lower leg 10/28/2013   Cellulitis of leg, right 10/11/2010   DEPRESSION, CHRONIC 10/06/2007   Diabetes mellitus without complication ([place])    DIABETES MELLITUS, WITH NEUROLOGICAL COMPLICATIONS 0/96/2836   Diabetic neuropathy ([place])    DYSLIPIDEMIA 05/13/2007   Family history of colon cancer    Fecal soiling 06/28/2014   Frozen shoulder    Lt   Full dentures    GERD (gastroesophageal reflux disease)    GOITER, MULTINODULAR 05/13/2007   Headache    Hx of colonic polyps 12/04/2010   HYPERCHOLESTEROLEMIA 03/20/2010   HYPERTENSION 03/20/2010   Lower back pain    NASH (nonalcoholic steatohepatitis)    OA (osteoarthritis)    OBSTRUCTIVE SLEEP APNEA 06/12/2007   uses CPAP.   PERIPHERAL NEUROPATHY 01/12/2009   Peripheral vascular disease ([place])    Pneumonia    PSORIASIS 05/28/2010   Renal insufficiency    stage 1 kd   Sleep apnea    UNSPECIFIED VENOUS INSUFFICIENCY 05/28/2010   Wears glasses     Family History  Problem Relation Age of Onset   Hyperlipidemia Father    Hypertension Father    Cirrhosis Father    Colon cancer Father 76       d. 68   Colon cancer Sister 63       d. 55   Bipolar disorder Sister    Aneurysm Mother        brain   Cerebral aneurysm Mother    Colon cancer Sister 86       d. 44   Bipolar disorder Sister    Cancer Paternal Aunt         NOS, ? colon   Congestive Heart Failure Maternal Grandmother    Drug abuse Neg Hx    CAD Neg Hx    Stomach cancer Neg Hx     Past Surgical History:  Procedure Laterality Date   CATARACT EXTRACTION W/ INTRAOCULAR LENS  IMPLANT, BILATERAL     COLONOSCOPY     ELECTROCARDIOGRAM  04/16/2006   EXAMINATION UNDER ANESTHESIA N/A 06/29/2014   Procedure: EXAM UNDER ANESTHESIA;  Surgeon: Janyth Contes, MD;  Location: [facility];  Service: Gynecology;  Laterality: N/A;   Exercise myoview  01/24/2005   FLEXIBLE SIGMOIDOSCOPY     GASTRIC BYPASS  11/09/2013   GASTRIC BYPASS     JOINT REPLACEMENT     KNEE ARTHROSCOPY  2003   right   LAPAROSCOPY  N/A 03/12/2016   Procedure: LAPAROSCOPIC ANASTOMOSIS OF INTESTINE (ENTEROENTEROSTOMY);  Surgeon: Ladora Daniel, MD;  Location: [facility];  Service: General;  Laterality: N/A;   LEG SURGERY     metal and pins in lower left leg   mrsa Right    arm   MULTIPLE TOOTH EXTRACTIONS     ROTATOR CUFF REPAIR  2008   right   SHOULDER ARTHROSCOPY W/ ROTATOR CUFF REPAIR Right    stab phlebectomy  Right 02/10/2019   stab phlebectomy > 20 incisions right leg by [name] MD    TONSILLECTOMY     TOTAL KNEE ARTHROPLASTY Left 06/30/2018   TOTAL KNEE ARTHROPLASTY Left 06/30/2018   Procedure: LEFT TOTAL KNEE ARTHROPLASTY;  Surgeon: Meredith Pel, MD;  Location: [facility];  Service: Orthopedics;  Laterality: Left;   VARICOSE VEIN SURGERY     Remotef   Social History   Occupational History   Occupation: Unemployed    Comment: disabled  Tobacco Use   Smoking status: Former Smoker    Packs/day: 2.00    Years: 24.00    Pack years: 48.00    Types: Cigarettes    Start date: 06/24/1970    Quit date: 08/06/1994    Years since quitting: 24.7   Smokeless tobacco: Never Used  Substance and Sexual Activity   Alcohol use: No    Alcohol/week: 0.0 standard drinks   Drug use: No   Sexual activity: Never

## 2019-04-21 ENCOUNTER — Encounter: Payer: Self-pay | Admitting: Family Medicine

## 2019-04-27 DIAGNOSIS — I129 Hypertensive chronic kidney disease with stage 1 through stage 4 chronic kidney disease, or unspecified chronic kidney disease: Secondary | ICD-10-CM | POA: Diagnosis not present

## 2019-04-27 DIAGNOSIS — R635 Abnormal weight gain: Secondary | ICD-10-CM | POA: Diagnosis not present

## 2019-04-27 DIAGNOSIS — N181 Chronic kidney disease, stage 1: Secondary | ICD-10-CM | POA: Diagnosis not present

## 2019-04-27 DIAGNOSIS — Z6835 Body mass index (BMI) 35.0-35.9, adult: Secondary | ICD-10-CM | POA: Diagnosis not present

## 2019-04-27 DIAGNOSIS — F419 Anxiety disorder, unspecified: Secondary | ICD-10-CM | POA: Diagnosis not present

## 2019-04-27 DIAGNOSIS — E119 Type 2 diabetes mellitus without complications: Secondary | ICD-10-CM | POA: Diagnosis not present

## 2019-04-27 DIAGNOSIS — G4733 Obstructive sleep apnea (adult) (pediatric): Secondary | ICD-10-CM | POA: Diagnosis not present

## 2019-04-27 DIAGNOSIS — E7800 Pure hypercholesterolemia, unspecified: Secondary | ICD-10-CM | POA: Diagnosis not present

## 2019-04-27 DIAGNOSIS — Z9884 Bariatric surgery status: Secondary | ICD-10-CM | POA: Diagnosis not present

## 2019-05-10 ENCOUNTER — Other Ambulatory Visit: Payer: Self-pay

## 2019-05-25 ENCOUNTER — Telehealth: Payer: Self-pay

## 2019-05-25 NOTE — Telephone Encounter (Signed)
noted 

## 2019-05-25 NOTE — Telephone Encounter (Signed)
Thx pls let debbie know

## 2019-05-25 NOTE — Telephone Encounter (Signed)
Nurse from pre-admit called stating patient told her at her pre-op appointment that she wanted to cancel her surgery. She would like to reschedule sometime in Feb or March. States she is afraid of getting COVID.

## 2019-05-25 NOTE — Telephone Encounter (Signed)
Sent to me by accident. Your pt . For TKA

## 2019-05-28 ENCOUNTER — Other Ambulatory Visit (HOSPITAL_COMMUNITY): Payer: Medicare HMO

## 2019-06-01 ENCOUNTER — Encounter (HOSPITAL_COMMUNITY): Admission: RE | Payer: Self-pay | Source: Home / Self Care

## 2019-06-01 ENCOUNTER — Ambulatory Visit (HOSPITAL_COMMUNITY): Admission: RE | Admit: 2019-06-01 | Payer: Medicare HMO | Source: Home / Self Care | Admitting: Orthopedic Surgery

## 2019-06-01 SURGERY — ARTHROPLASTY, KNEE, TOTAL
Anesthesia: Spinal | Site: Knee | Laterality: Right

## 2019-06-16 ENCOUNTER — Inpatient Hospital Stay: Payer: Medicare HMO | Admitting: Orthopedic Surgery

## 2019-06-23 ENCOUNTER — Encounter: Payer: Self-pay | Admitting: Vascular Surgery

## 2019-07-11 NOTE — Progress Notes (Signed)
Subjective:    Patient ID: [name], female    DOB: [birth year], 64 y.o.   MRN: [id number]   CC: Diabetes follow up  HPI: [name] is a very pleasant patient presenting today for diabetes follow up in general health maintenance.   Diabetes: Her last HbA1c was 7.7% on 02/19/2019. She is currently taking Metformin 1012m daily with breakfast. Last BMP on 06/19/2018 showed elevated Creatinine 1.33 and low GFR 42. She is currently taking Neurontin. Seeing Dr. JMacarthur Critchley(Optometrist) for her eye care. She did have a podiatrist (Dr. [place]ulla [place]. She has not been to see either of them in about 1 year or so.  No concern for vision changes, headaches, chest pain, shortness of breath, abdominal pain, nausea, vomiting.  HTN: BP today 124/60@! She is taking Lisinopril-HCTZ 20-235mfor HTN and Amlodipine 1026mShe has also been working with [employer] to improve her diet and lose weight (has lost 2 lbs). She has also been exercising at the gym.   HLD: Taking Atorvastatin 78m57mily. Last Lipid panel was 05/07/2017 and showed elevated cholesterol. The 10-year ASCVD risk score (GofMikey BussingJr.,Brooke Bonitot al., 2013) is: 13.6%   Values used to calculate the score:     Age: 15 y26rs     Sex: Female     Is Non-Hispanic African American: No     Diabetic: Yes     Tobacco smoker: No     Systolic Blood Pressure: 124 979g     Is BP treated: Yes     HDL Cholesterol: 42 mg/dL     Total Cholesterol: 205 mg/dL  Health maintenance: the patient is due to be seen by her optometrist, podiatrist for diabetes follow-up.  She is also due for a screening mammogram, which was ordered at today's visit.  She had a colonoscopy last year in 2020, she receives a colonoscopy every 2-3 years due to strong family history of colon cancer.  She denies any concerns with unintentional weight loss, blood in her stools, dark stools, or pelvic/abdominal pain.  Smoking status reviewed - quit smoking at 39 y70rs old, smoked  from [age]rs old.   Review of Systems - see HPI   Objective:  BP 124/60   Pulse 77   Ht 5' 8"  (1.727 m)   Wt 228 lb 6 oz (103.6 kg)   SpO2 98%   BMI 34.72 kg/m  Vitals and nursing note reviewed  Physical exam: General: well nourished, in no acute distress Cardiac: RRR, clear S1 and S2, no murmurs appreciated Respiratory: CTA bilaterally, normal work of breathing Abdomen: soft, nontender, nondistended, no masses or organomegaly. Bowel sounds present Skin: Her hands are dry bilaterally with occasional cracking, no bleeding appreciated; one small ecchymosis is appreciated to her left wrist where her watch is located, most likely secondary to patient being on Eliquis Neuro: alert and oriented, no focal deficits appreciated   Assessment & Plan:   Hypertension associated with diabetes (HCC)Vinaood pressure today 124/60.  Patient is stable on amlodipine 10 mg and lisinopril-HCTZ daily.   -Continue medications -DASH diet -Patient to continue weight loss journey.  Dyslipidemia associated with type 2 diabetes mellitus (HCC)Farmingtontient's ASCVD risk 16.1%. -Continue atorvastatin 40 mg daily -Recheck lipid panel today  Type 2 diabetes, controlled, with neuropathy ([facility]) HbA1c today slightly improved at 7.5%, down from 7.7% 4 months earlier. -Patient with good control on Metformin 1000 mg each morning -Patient would also benefit from canagliflozin, but can discuss  at next visit  Screening mammogram, encounter for The patient is due for a yearly screening mammogram.  No personal or family history of breast cancer. -Screening mammogram ordered   Return in 3 months (on 10/10/2019), or if symptoms worsen or fail to improve.   Dr. Milus Banister [facility], PGY-2

## 2019-07-12 ENCOUNTER — Encounter: Payer: Self-pay | Admitting: Family Medicine

## 2019-07-12 ENCOUNTER — Ambulatory Visit (INDEPENDENT_AMBULATORY_CARE_PROVIDER_SITE_OTHER): Payer: Medicare HMO | Admitting: Family Medicine

## 2019-07-12 ENCOUNTER — Other Ambulatory Visit: Payer: Self-pay

## 2019-07-12 VITALS — BP 124/60 | HR 77 | Ht 68.0 in | Wt 228.4 lb

## 2019-07-12 DIAGNOSIS — I10 Essential (primary) hypertension: Secondary | ICD-10-CM

## 2019-07-12 DIAGNOSIS — E1169 Type 2 diabetes mellitus with other specified complication: Secondary | ICD-10-CM | POA: Diagnosis not present

## 2019-07-12 DIAGNOSIS — E785 Hyperlipidemia, unspecified: Secondary | ICD-10-CM | POA: Diagnosis not present

## 2019-07-12 DIAGNOSIS — E1159 Type 2 diabetes mellitus with other circulatory complications: Secondary | ICD-10-CM | POA: Diagnosis not present

## 2019-07-12 DIAGNOSIS — Z1231 Encounter for screening mammogram for malignant neoplasm of breast: Secondary | ICD-10-CM | POA: Insufficient documentation

## 2019-07-12 DIAGNOSIS — E1140 Type 2 diabetes mellitus with diabetic neuropathy, unspecified: Secondary | ICD-10-CM | POA: Diagnosis not present

## 2019-07-12 DIAGNOSIS — I152 Hypertension secondary to endocrine disorders: Secondary | ICD-10-CM

## 2019-07-12 HISTORY — DX: Encounter for screening mammogram for malignant neoplasm of breast: Z12.31

## 2019-07-12 LAB — POCT GLYCOSYLATED HEMOGLOBIN (HGB A1C): HbA1c, POC (controlled diabetic range): 7.5 % — AB (ref 0.0–7.0)

## 2019-07-12 NOTE — Assessment & Plan Note (Signed)
The patient is due for a yearly screening mammogram.  No personal or family history of breast cancer. -Screening mammogram ordered

## 2019-07-12 NOTE — Patient Instructions (Addendum)
Thank you for coming in to see [place] today! Please see below to review our plan for today's visit:  1. Please follow up with your Podiatrist (Foot doctor) Dr. Paulla Dolly at [place], [place], [place] [address], [phone]. YOU NEED TO SEE A FOOT DOCTOR EVERY YEAR! 2. Follow up with your (optometrist) eye doctor Dr. Macarthur Critchley at [address] [place], [place] [address], [phone]. 3. Let's get you on a WOMEN'S ONE-A-DAY MULTIVITAMIN! 4. We are checking your kidney function, electrolytes, and cholesterol levels today. I will send you a letter with results. 5. Get your mammogram!    Please call the clinic at [phone] if your symptoms worsen or you have any concerns. It was our pleasure to serve you!  Dr. Milus Banister [facility]

## 2019-07-12 NOTE — Assessment & Plan Note (Signed)
Blood pressure today 124/60.  Patient is stable on amlodipine 10 mg and lisinopril-HCTZ daily.   -Continue medications -DASH diet -Patient to continue weight loss journey.

## 2019-07-12 NOTE — Assessment & Plan Note (Signed)
HbA1c today slightly improved at 7.5%, down from 7.7% 4 months earlier. -Patient with good control on Metformin 1000 mg each morning -Patient would also benefit from canagliflozin, but can discuss at next visit

## 2019-07-12 NOTE — Assessment & Plan Note (Signed)
Patient's ASCVD risk 16.1%. -Continue atorvastatin 40 mg daily -Recheck lipid panel today

## 2019-07-13 ENCOUNTER — Other Ambulatory Visit: Payer: Self-pay | Admitting: Family Medicine

## 2019-07-13 DIAGNOSIS — Z1231 Encounter for screening mammogram for malignant neoplasm of breast: Secondary | ICD-10-CM

## 2019-07-13 LAB — LIPID PANEL
Chol/HDL Ratio: 3.2 ratio (ref 0.0–4.4)
Cholesterol, Total: 169 mg/dL (ref 100–199)
HDL: 53 mg/dL (ref >39)
LDL Chol Calc (NIH): 100 mg/dL — ABNORMAL HIGH (ref 0–99)
Triglycerides: 89 mg/dL (ref 0–149)
VLDL Cholesterol Cal: 16 mg/dL (ref 5–40)

## 2019-07-13 LAB — BASIC METABOLIC PANEL
BUN/Creatinine Ratio: 17 (ref 12–28)
BUN: 18 mg/dL (ref 8–27)
CO2: 26 mmol/L (ref 20–29)
Calcium: 10.5 mg/dL — ABNORMAL HIGH (ref 8.7–10.3)
Chloride: 104 mmol/L (ref 96–106)
Creatinine, Ser: 1.06 mg/dL — ABNORMAL HIGH (ref 0.57–1.00)
GFR calc Af Amer: 64 mL/min/{1.73_m2} (ref >59)
GFR calc non Af Amer: 56 mL/min/{1.73_m2} — ABNORMAL LOW (ref >59)
Glucose: 142 mg/dL — ABNORMAL HIGH (ref 65–99)
Potassium: 4.8 mmol/L (ref 3.5–5.2)
Sodium: 140 mmol/L (ref 134–144)

## 2019-07-14 ENCOUNTER — Other Ambulatory Visit: Payer: Self-pay

## 2019-07-14 ENCOUNTER — Ambulatory Visit
Admission: RE | Admit: 2019-07-14 | Discharge: 2019-07-14 | Disposition: A | Discharge status: Home / Self Care | Payer: Medicare HMO | Source: Ambulatory Visit | Attending: Family Medicine | Admitting: Family Medicine

## 2019-07-14 ENCOUNTER — Other Ambulatory Visit: Payer: Self-pay | Admitting: Family Medicine

## 2019-07-14 ENCOUNTER — Ambulatory Visit: Payer: Medicare HMO

## 2019-07-14 DIAGNOSIS — Z1231 Encounter for screening mammogram for malignant neoplasm of breast: Secondary | ICD-10-CM

## 2019-07-15 ENCOUNTER — Ambulatory Visit: Payer: Medicare HMO | Admitting: Podiatry

## 2019-07-19 ENCOUNTER — Ambulatory Visit: Payer: Medicare HMO | Admitting: Podiatry

## 2019-07-19 ENCOUNTER — Encounter: Payer: Self-pay | Admitting: Podiatry

## 2019-07-19 ENCOUNTER — Ambulatory Visit (INDEPENDENT_AMBULATORY_CARE_PROVIDER_SITE_OTHER): Payer: Medicare HMO

## 2019-07-19 ENCOUNTER — Other Ambulatory Visit: Payer: Self-pay

## 2019-07-19 VITALS — Temp 96.0°F

## 2019-07-19 DIAGNOSIS — M2012 Hallux valgus (acquired), left foot: Secondary | ICD-10-CM

## 2019-07-19 DIAGNOSIS — R319 Hematuria, unspecified: Secondary | ICD-10-CM

## 2019-07-19 DIAGNOSIS — M2011 Hallux valgus (acquired), right foot: Secondary | ICD-10-CM | POA: Diagnosis not present

## 2019-07-19 DIAGNOSIS — M79674 Pain in right toe(s): Secondary | ICD-10-CM

## 2019-07-19 DIAGNOSIS — M79675 Pain in left toe(s): Secondary | ICD-10-CM | POA: Diagnosis not present

## 2019-07-19 DIAGNOSIS — R110 Nausea: Secondary | ICD-10-CM | POA: Insufficient documentation

## 2019-07-19 DIAGNOSIS — L600 Ingrowing nail: Secondary | ICD-10-CM

## 2019-07-19 DIAGNOSIS — N898 Other specified noninflammatory disorders of vagina: Secondary | ICD-10-CM

## 2019-07-19 DIAGNOSIS — E039 Hypothyroidism, unspecified: Secondary | ICD-10-CM | POA: Insufficient documentation

## 2019-07-19 DIAGNOSIS — B351 Tinea unguium: Secondary | ICD-10-CM

## 2019-07-19 DIAGNOSIS — R001 Bradycardia, unspecified: Secondary | ICD-10-CM | POA: Insufficient documentation

## 2019-07-19 HISTORY — DX: Nausea: R11.0

## 2019-07-19 HISTORY — DX: Hypothyroidism, unspecified: E03.9

## 2019-07-19 HISTORY — DX: Other specified noninflammatory disorders of vagina: N89.8

## 2019-07-19 HISTORY — DX: Hematuria, unspecified: R31.9

## 2019-07-19 NOTE — Patient Instructions (Addendum)
Bunion  A bunion is a bump on the base of the big toe that forms when the bones of the big toe joint move out of position. Bunions may be small at first, but they often get larger over time. They can make walking painful. What are the causes? A bunion may be caused by:  Wearing narrow or pointed shoes that force the big toe to press against the other toes.  Abnormal foot development that causes the foot to roll inward (pronate).  Changes in the foot that are caused by certain diseases, such as rheumatoid arthritis or polio.  A foot injury. What increases the risk? The following factors may make you more likely to develop this condition:  Wearing shoes that squeeze the toes together.  Having certain diseases, such as: ? Rheumatoid arthritis. ? Polio. ? Cerebral palsy.  Having family members who have bunions.  Being born with a foot deformity, such as flat feet or low arches.  Doing activities that put a lot of pressure on the feet, such as ballet dancing. What are the signs or symptoms? The main symptom of a bunion is a noticeable bump on the big toe. Other symptoms may include:  Pain.  Swelling around the big toe.  Redness and inflammation.  Thick or hardened skin on the big toe or between the toes.  Stiffness or loss of motion in the big toe.  Trouble with walking. How is this diagnosed? A bunion may be diagnosed based on your symptoms, medical history, and activities. You may have tests, such as:  X-rays. These allow your health care provider to check the position of the bones in your foot and look for damage to your joint. They also help your health care provider determine the severity of your bunion and the best way to treat it.  Joint aspiration. In this test, a sample of fluid is removed from the toe joint. This test may be done if you are in a lot of pain. It helps rule out diseases that cause painful swelling of the joints, such as arthritis. How is this  treated? Treatment depends on the severity of your symptoms. The goal of treatment is to relieve symptoms and prevent the bunion from getting worse. Your health care provider may recommend:  Wearing shoes that have a wide toe box.  Using bunion pads to cushion the affected area.  Taping your toes together to keep them in a normal position.  Placing a device inside your shoe (orthotics) to help reduce pressure on your toe joint.  Taking medicine to ease pain, inflammation, and swelling.  Applying heat or ice to the affected area.  Doing stretching exercises.  Surgery to remove scar tissue and move the toes back into their normal position. This treatment is rare. Follow these instructions at home: Managing pain, stiffness, and swelling   If directed, put ice on the painful area: ? Put ice in a plastic bag. ? Place a towel between your skin and the bag. ? Leave the ice on for 20 minutes, 2-3 times a day. Activity   If directed, apply heat to the affected area before you exercise. Use the heat source that your health care provider recommends, such as a moist heat pack or a heating pad. ? Place a towel between your skin and the heat source. ? Leave the heat on for 20-30 minutes. ? Remove the heat if your skin turns bright red. This is especially important if you are unable to feel pain,  heat, or cold. You may have a greater risk of getting burned.  Do exercises as told by your health care provider. General instructions  Support your toe joint with proper footwear, shoe padding, or taping as told by your health care provider.  Take over-the-counter and prescription medicines only as told by your health care provider.  Keep all follow-up visits as told by your health care provider. This is important. Contact a health care provider if your symptoms:  Get worse.  Do not improve in 2 weeks. Get help right away if you have:  Severe pain and trouble with walking. Summary  A  bunion is a bump on the base of the big toe that forms when the bones of the big toe joint move out of position.  Bunions can make walking painful.  Treatment depends on the severity of your symptoms.  Support your toe joint with proper footwear, shoe padding, or taping as told by your health care provider. This information is not intended to replace advice given to you by your health care provider. Make sure you discuss any questions you have with your health care provider. Document Revised: 12/15/2017 Document Reviewed: 10/21/2017 Elsevier Patient Education  2020 [place] Instructions    THE DAY AFTER THE PROCEDURE  Place 1/4 cup of epsom salts in a quart of warm tap water.  Submerge your foot or feet with outer bandage intact for the initial soak; this will allow the bandage to become moist and wet for easy lift off.  Once you remove your bandage, continue to soak in the solution for 20 minutes.  This soak should be done twice a day.  Next, remove your foot or feet from solution, blot dry the affected area and cover.  You may use a band aid large enough to cover the area or use gauze and tape.  Apply other medications to the area as directed by the doctor such as polysporin neosporin.  IF YOUR SKIN BECOMES IRRITATED WHILE USING THESE INSTRUCTIONS, IT IS OKAY TO SWITCH TO  WHITE VINEGAR AND WATER. Or you may use antibacterial soap and water to keep the toe clean  Monitor for any signs/symptoms of infection. Call the office immediately if any occur or go directly to the emergency room. Call with any questions/concerns.    [place] Instructions-Post Nail Surgery  You have had your ingrown toenail and root treated with a chemical.  This chemical causes a burn that will drain and ooze like a blister.  This can drain for 6-8 weeks or longer.  It is important to keep this area clean, covered, and follow the soaking instructions dispensed at the time of your surgery.  This  area will eventually dry and form a scab.  Once the scab forms you no longer need to soak or apply a dressing.  If at any time you experience an increase in pain, redness, swelling, or drainage, you should contact the office as soon as possible.   Betadine Soak Instructions  Purchase an 8 oz. bottle of BETADINE solution (Povidone)  THE DAY AFTER THE PROCEDURE  Place 1 tablespoon of betadine solution in a quart of warm tap water.  Submerge your foot or feet with outer bandage intact for the initial soak; this will allow the bandage to become moist and wet for easy lift off.  Once you remove your bandage, continue to soak in the solution for 20 minutes.  This soak should be done twice a day.  Next, remove your foot or feet from solution, blot dry the affected area and cover.  You may use a band aid large enough to cover the area or use gauze and tape.  Apply other medications to the area as directed by the doctor such as cortisporin otic solution (ear drops) or neosporin.  IF YOUR SKIN BECOMES IRRITATED WHILE USING THESE INSTRUCTIONS, IT IS OKAY TO SWITCH TO EPSOM SALTS AND WATER OR WHITE VINEGAR AND WATER.  [place] Instructions-Post Nail Surgery  You have had your ingrown toenail and root treated with a chemical.  This chemical causes a burn that will drain and ooze like a blister.  This can drain for 6-8 weeks or longer.  It is important to keep this area clean, covered, and follow the soaking instructions dispensed at the time of your surgery.  This area will eventually dry and form a scab.  Once the scab forms you no longer need to soak or apply a dressing.  If at any time you experience an increase in pain, redness, swelling, or drainage, you should contact the office as soon as possible.

## 2019-07-19 NOTE — Progress Notes (Signed)
Subjective:   Patient ID: [name], female   DOB: 64 y.o.   MRN: [id number]   HPI Patient presents with a very painful second nail bed right second that is painful when pressed make shoe gear difficult.  Also concerned about her big toe joints left over right stating they started to get sore again and has nail disease that she cannot take care of herself and get ingrown and painful in the corners.  Patient states she is under stable medical condition currently   ROS      Objective:  Physical Exam  Neurovascular status intact with thickened deformed second nail right that is painful when palpated with patient also noted to have thick nailbeds 1-5 both feet that are mildly tender in the corners and has significant elevation of the metatarsal left over right first MPJ     Assessment:  Hallux limitus with inflammatory capsulitis bilateral along with damaged thickened painful second nail right and mycotic nail infection 1-5 both feet     Plan:  H&P reviewed all conditions and at this point I recommend removal of the second nail and patient wants this done understanding procedure.  I went ahead and infiltrated 60 mg like Marcaine mixture after patient signed consent form sterile prep applied to the toe and using sterile instrumentation remove the nail exposed matrix and applied phenol for applications 30 seconds followed by alcohol lavage sterile dressing.  Gave instructions on soaks debrided remaining nails.  Discussed the toe joints and I do not recommend current surgery but I do recommend wider shoes mesh material and I educated her on elevation of the first metatarsals.  Reviewed x-rays  X-rays indicate there is significant elevation of the first metatarsal segment left over right with previous bone spur removal left with no indications of advanced arthritis

## 2019-07-20 DIAGNOSIS — H353130 Nonexudative age-related macular degeneration, bilateral, stage unspecified: Secondary | ICD-10-CM | POA: Diagnosis not present

## 2019-07-30 ENCOUNTER — Encounter: Payer: Self-pay | Admitting: Family Medicine

## 2019-09-04 ENCOUNTER — Ambulatory Visit: Payer: Medicare HMO | Attending: Internal Medicine

## 2019-09-04 DIAGNOSIS — Z23 Encounter for immunization: Secondary | ICD-10-CM

## 2019-09-04 NOTE — Progress Notes (Signed)
   Covid-19 Vaccination Clinic  Name:  [name]    MRN: [id number] DOB: 09/04/1955  09/04/2019  Ms. Lesser was observed post Covid-19 immunization for 15 minutes without incident. She was provided with Vaccine Information Sheet and instruction to access the V-Safe system.   [name] was instructed to call 911 with any severe reactions post vaccine: Marland Kitchen Difficulty breathing  . Swelling of face and throat  . A fast heartbeat  . A bad rash all over body  . Dizziness and weakness   Immunizations Administered    Name Date Dose VIS Date Route   Moderna COVID-19 Vaccine 09/04/2019  3:51 PM 0.5 mL 05/25/2019 Intramuscular   Manufacturer: Moderna   Lot: [id number]   [place]: [id number]

## 2019-09-13 DIAGNOSIS — Z01818 Encounter for other preprocedural examination: Secondary | ICD-10-CM | POA: Diagnosis not present

## 2019-09-13 DIAGNOSIS — H02401 Unspecified ptosis of right eyelid: Secondary | ICD-10-CM | POA: Diagnosis not present

## 2019-09-23 ENCOUNTER — Ambulatory Visit: Payer: Self-pay

## 2019-09-23 ENCOUNTER — Other Ambulatory Visit: Payer: Self-pay

## 2019-09-23 ENCOUNTER — Ambulatory Visit: Payer: Medicare HMO | Admitting: Orthopedic Surgery

## 2019-09-23 VITALS — Ht 68.0 in | Wt 239.0 lb

## 2019-09-23 DIAGNOSIS — M25561 Pain in right knee: Secondary | ICD-10-CM

## 2019-09-23 DIAGNOSIS — M1711 Unilateral primary osteoarthritis, right knee: Secondary | ICD-10-CM

## 2019-09-24 ENCOUNTER — Encounter: Payer: Self-pay | Admitting: Orthopedic Surgery

## 2019-09-24 NOTE — Progress Notes (Signed)
Office Visit Note   Patient: [name]           Date of Birth: Jul 03, 1955           MRN: [id number] Visit Date: 09/23/2019 Requested by: [address],  [place] [address] PCP: Daisy Floro, DO  Subjective: Chief Complaint  Patient presents with  . Right Knee - Pain    HPI: [name] is a 64 y.o. female who presents to the office complaining of right knee pain.  Patient is coming in to discuss right knee surgery.  She is tentatively scheduled for right total knee arthroplasty on 10/12/2019.  She has previously canceled her surgery in the past due to Covid concerns.  She has a long history of right knee pain.  The right knee gives out on her occasionally but she denies any locking symptoms.  She does take occasional Tylenol for her pain.  She has a history of diabetes with her most recent A1c being 7.5.  She denies any personal or family history of blood clots.  She does take Eliquis due to a history of atrial fibrillation.  She was taken off her Eliquis for the last total knee arthroplasty she had in January 2020.  She denies any recent cortisone injection into her right knee..                ROS:  All systems reviewed are negative as they relate to the chief complaint within the history of present illness.  Patient denies fevers or chills.  Assessment & Plan: Visit Diagnoses:  1. Right knee pain, unspecified chronicity   2. Unilateral primary osteoarthritis, right knee     Plan: Patient is a 64 year old female who presents complaining of right knee pain.  She is tentatively scheduled for right total knee arthroplasty on 10/12/2019.  She has a history of diabetes but denies any history of blood clots.  She is on Eliquis due to history of atrial fibrillation.  She will need to follow-up with her cardiologist for clearance prior to knee replacement surgery.  She had new right knee radiographs taken today at patient's request due to multiple  falls in the past several months.  Radiographs showed end-stage tricompartmental degenerative changes of the right knee.  There is no evidence of any fractures.  Patient will receive risk stratification from her cardiologist and plan for right total knee arthroplasty on 10/12/2019 tentatively based on that risk stratification.  Follow-Up Instructions: No follow-ups on file.   Orders:  Orders Placed This Encounter  Procedures  . XR KNEE 3 VIEW RIGHT   No orders of the defined types were placed in this encounter.     Procedures: No procedures performed   Clinical Data: No additional findings.  Objective: Vital Signs: Ht 5' 8"  (1.727 m)   Wt 239 lb (108.4 kg)   BMI 36.34 kg/m   Physical Exam:  Constitutional: Patient appears well-developed HEENT:  Head: Normocephalic Eyes:EOM are normal Neck: Normal range of motion Cardiovascular: Normal rate Pulmonary/chest: Effort normal Neurologic: Patient is alert Skin: Skin is warm Psychiatric: Patient has normal mood and affect  Ortho Exam:  Right knee Exam Trace effusion Tender to palpation over the medial and lateral joint lines Extensor mechanism intact No TTP over the quad tendon, patellar tendon, pes anserinus, patella, tibial tubercle, LCL/MCL insertions Stable to varus/valgus stresses.  Stable to anterior/posterior drawer Extension to 0-5 degrees Flexion > 90 degrees  Specialty Comments:  No specialty comments available.  Imaging: No results found.   PMFS History: Patient Active Problem List   Diagnosis Date Noted  . Blood in urine 07/19/2019  . Hypothyroidism 07/19/2019  . Nausea 07/19/2019  . Sinus bradycardia 07/19/2019  . Vaginal irritation 07/19/2019  . Screening mammogram, encounter for 07/12/2019  . Unilateral primary osteoarthritis, left knee   . Hypertension associated with diabetes ([place]) 07/11/2018  . Dyslipidemia associated with type 2 diabetes mellitus ([place]) 07/11/2018  . Short bowel syndrome  07/11/2018  . Peripheral autonomic neuropathy due to diabetes mellitus ([place]) 07/11/2018  . Status post left knee replacement 07/11/2018  . Major depression in remission ([place]) 07/07/2018  . Primary osteoarthritis involving multiple joints 07/07/2018  . Arthritis of knee 06/30/2018  . Paroxysmal atrial fibrillation ([place]) 05/11/2018  . Mild cognitive impairment 04/30/2018  . Generalized abdominal pain 02/05/2018  . Spigelian hernia 02/05/2018  . Venous stasis dermatitis of right lower extremity 01/02/2018  . Genetic testing 11/24/2017  . Family history of colon cancer   . Dyslipidemia 04/15/2017  . Gastroesophageal reflux disease without esophagitis 04/15/2017  . B12 deficiency 12/13/2016  . GI bleed 03/15/2016  . Malabsorption 03/12/2016  . Tubular adenoma of colon 03/09/2016  . Vitamin D deficiency 03/09/2016  . Fecal incontinence 08/02/2015  . Rectal prolapse 05/24/2015  . Stress 05/24/2015  . Rectovaginal fistula 05/25/2014  . S/P bariatric surgery-duodenal switch with sleeve gastrectomy 12/04/2013  . Nonobstructive CAD  08/26/2013  . Iron deficiency anemia 06/19/2012  . Low back pain 03/05/2012  . Primary osteoarthritis of left knee 04/23/2011  . UNSPECIFIED VENOUS INSUFFICIENCY 05/28/2010  . PSORIASIS 05/28/2010  . HYPERCHOLESTEROLEMIA 03/20/2010  . Essential hypertension 03/20/2010  . Chronic dermatitis of hands 05/02/2009  . Peripheral neuropathy 01/12/2009  . Type 2 diabetes, controlled, with neuropathy ([place]) 02/18/2008  . Chronic depression 10/06/2007  . OBSTRUCTIVE SLEEP APNEA 06/12/2007  . GOITER, MULTINODULAR 05/13/2007   Past Medical History:  Diagnosis Date  . Allergy   . ANEMIA, PERNICIOUS, HX OF 05/13/2007  . Anxiety   . Arthritis   . ASYMPTOMATIC POSTMENOPAUSAL STATUS 02/18/2008  . BACK PAIN, LUMBAR 11/19/2007  . Bowel incontinence   . Cataract   . Cellulitis of left lower leg 10/28/2013  . Cellulitis of leg, right 10/11/2010  . DEPRESSION, CHRONIC  10/06/2007  . Diabetes mellitus without complication ([place])   . DIABETES MELLITUS, WITH NEUROLOGICAL COMPLICATIONS 9/48/5462  . Diabetic neuropathy ([place])   . DYSLIPIDEMIA 05/13/2007  . Family history of colon cancer   . Fecal soiling 06/28/2014  . Frozen shoulder    Lt  . Full dentures   . GERD (gastroesophageal reflux disease)   . GOITER, MULTINODULAR 05/13/2007  . Headache   . Hx of colonic polyps 12/04/2010  . HYPERCHOLESTEROLEMIA 03/20/2010  . HYPERTENSION 03/20/2010  . Lower back pain   . NASH (nonalcoholic steatohepatitis)   . OA (osteoarthritis)   . OBSTRUCTIVE SLEEP APNEA 06/12/2007   uses CPAP.  Marland Kitchen PERIPHERAL NEUROPATHY 01/12/2009  . Peripheral vascular disease ([place])   . Pneumonia   . PSORIASIS 05/28/2010  . Renal insufficiency    stage 1 kd  . Sleep apnea   . UNSPECIFIED VENOUS INSUFFICIENCY 05/28/2010  . Wears glasses     Family History  Problem Relation Age of Onset  . Hyperlipidemia Father   . Hypertension Father   . Cirrhosis Father   . Colon cancer Father 8       d. 61  . Colon cancer Sister 75  d. 70  . Bipolar disorder Sister   . Aneurysm Mother        brain  . Cerebral aneurysm Mother   . Colon cancer Sister 78       d. 62  . Bipolar disorder Sister   . Cancer Paternal Aunt        NOS, ? colon  . Congestive Heart Failure Maternal Grandmother   . Drug abuse Neg Hx   . CAD Neg Hx   . Stomach cancer Neg Hx     Past Surgical History:  Procedure Laterality Date  . CATARACT EXTRACTION W/ INTRAOCULAR LENS  IMPLANT, BILATERAL    . COLONOSCOPY    . ELECTROCARDIOGRAM  04/16/2006  . EXAMINATION UNDER ANESTHESIA N/A 06/29/2014   Procedure: EXAM UNDER ANESTHESIA;  Surgeon: Janyth Contes, MD;  Location: [facility];  Service: Gynecology;  Laterality: N/A;  . Exercise myoview  01/24/2005  . FLEXIBLE SIGMOIDOSCOPY    . GASTRIC BYPASS  11/09/2013  . GASTRIC BYPASS    . JOINT REPLACEMENT    . KNEE ARTHROSCOPY  2003   right  . LAPAROSCOPY N/A 03/12/2016    Procedure: LAPAROSCOPIC ANASTOMOSIS OF INTESTINE (ENTEROENTEROSTOMY);  Surgeon: Ladora Daniel, MD;  Location: [facility];  Service: General;  Laterality: N/A;  . LEG SURGERY     metal and pins in lower left leg  . mrsa Right    arm  . MULTIPLE TOOTH EXTRACTIONS    . ROTATOR CUFF REPAIR  2008   right  . SHOULDER ARTHROSCOPY W/ ROTATOR CUFF REPAIR Right   . stab phlebectomy  Right 02/10/2019   stab phlebectomy > 20 incisions right leg by [name] MD   . TONSILLECTOMY    . TOTAL KNEE ARTHROPLASTY Left 06/30/2018  . TOTAL KNEE ARTHROPLASTY Left 06/30/2018   Procedure: LEFT TOTAL KNEE ARTHROPLASTY;  Surgeon: Meredith Pel, MD;  Location: [facility];  Service: Orthopedics;  Laterality: Left;  Marland Kitchen VARICOSE VEIN SURGERY     Remotef   Social History   Occupational History  . Occupation: Unemployed    Comment: disabled  Tobacco Use  . Smoking status: Former Smoker    Packs/day: 2.00    Years: 24.00    Pack years: 48.00    Types: Cigarettes    Start date: 06/24/1970    Quit date: 08/06/1994    Years since quitting: 25.1  . Smokeless tobacco: Never Used  Substance and Sexual Activity  . Alcohol use: No    Alcohol/week: 0.0 standard drinks  . Drug use: No  . Sexual activity: Never

## 2019-09-29 ENCOUNTER — Ambulatory Visit (INDEPENDENT_AMBULATORY_CARE_PROVIDER_SITE_OTHER): Payer: Medicare HMO | Admitting: Family Medicine

## 2019-09-29 ENCOUNTER — Ambulatory Visit (HOSPITAL_COMMUNITY)
Admission: RE | Admit: 2019-09-29 | Discharge: 2019-09-29 | Disposition: A | Discharge status: Home / Self Care | Payer: Medicare HMO | Source: Ambulatory Visit | Attending: Family Medicine | Admitting: Family Medicine

## 2019-09-29 ENCOUNTER — Other Ambulatory Visit: Payer: Self-pay

## 2019-09-29 VITALS — BP 145/75 | HR 74 | Ht 68.0 in | Wt 236.6 lb

## 2019-09-29 DIAGNOSIS — Z01818 Encounter for other preprocedural examination: Secondary | ICD-10-CM | POA: Diagnosis not present

## 2019-09-29 DIAGNOSIS — Z7189 Other specified counseling: Secondary | ICD-10-CM

## 2019-09-29 DIAGNOSIS — Z01810 Encounter for preprocedural cardiovascular examination: Secondary | ICD-10-CM | POA: Diagnosis not present

## 2019-09-29 DIAGNOSIS — Z9189 Other specified personal risk factors, not elsewhere classified: Secondary | ICD-10-CM | POA: Diagnosis not present

## 2019-09-29 NOTE — Patient Instructions (Signed)
It was great meeting you today!  I think that you are good to go from a surgery clearance standpoint.  Your EKG looks fine, but I will fax this over to your cardiologist so they can take a look at as well.  I will fill out a preclearance sheet for your surgery and get this faxed over to your orthopedic surgeon.  Typically we will hold your Eliquis for 3 to 5 days prior to surgery.  We did go over your risk profile today and it looks quite good for the surgery you are having.

## 2019-10-01 ENCOUNTER — Ambulatory Visit: Payer: Medicare HMO | Admitting: Family Medicine

## 2019-10-02 ENCOUNTER — Encounter: Payer: Self-pay | Admitting: Family Medicine

## 2019-10-02 DIAGNOSIS — Z7189 Other specified counseling: Secondary | ICD-10-CM | POA: Insufficient documentation

## 2019-10-02 HISTORY — DX: Other specified counseling: Z71.89

## 2019-10-02 NOTE — Assessment & Plan Note (Signed)
Patient undergoing total knee replacement in late April.  EKG showing stable atrial fibrillation without RVR.  Fortunately she is at below average risk for the NSQIP risk calculator.  I reviewed the possibility of various complications with her.  I offered her a copy of her report, which she did not want.  From a respiratory standpoint the patient was able to walk 3 full laps around the clinic with no issues.  We will need to hold Metformin, blood pressure medications on the day of surgery.  Will need to hold Eliquis for 3 days prior to surgery.  I did advise patient to follow-up with her cardiologist to make sure there were no issues.  Fax of the EKG sent to cardiology.

## 2019-10-02 NOTE — Progress Notes (Signed)
   CHIEF COMPLAINT / HPI: 64 year old female who presents for preoperative clearance.  Patient is getting a right total knee replacement performed.  She sees a cardiologist for known A. fib.  She opted to have an EKG done at this clinic because she did not want to drive a long distance to see her cardiologist.  She is having no chest pain, shortness of breath, or any concerning symptoms.  The patient is on Eliquis for her A. Fib.  I reviewed with the patient that for her procedure and her age coupled with her past medical history her risk of a serious complication occurring for the procedure is 2.6% per the NSQIP calculator.  I reviewed in detail the patient's risk profile and her risk of various complications per this calculator.  Fortunately she is below average for most categories.   PERTINENT  PMH / PSH: Atrial fibrillation, diabetes   OBJECTIVE: BP (!) 145/75   Pulse 74   Ht 5' 8"  (1.727 m)   Wt 236 lb 9.6 oz (107.3 kg)   SpO2 97%   BMI 35.97 kg/m   Gen: 64 year old Caucasian female, no acute distress, very pleasant CV: Irregularly irregular rhythm, no M/R/G.  Skin warm and dry Resp: Lungs clear to auscultation bilaterally, no respiratory distress.  Patient able to make 3 full laps around the clinic without any chest pain or shortness of breath. Neuro: Alert and oriented, Speech clear, No gross deficits  EKG: Reviewed, shows known atrial fibrillation, without RVR.  No concerning findings.  ASSESSMENT / PLAN:  Surgical counseling visit Patient undergoing total knee replacement in late April.  EKG showing stable atrial fibrillation without RVR.  Fortunately she is at below average risk for the NSQIP risk calculator.  I reviewed the possibility of various complications with her.  I offered her a copy of her report, which she did not want.  From a respiratory standpoint the patient was able to walk 3 full laps around the clinic with no issues.  We will need to hold Metformin, blood  pressure medications on the day of surgery.  Will need to hold Eliquis for 3 days prior to surgery.  I did advise patient to follow-up with her cardiologist to make sure there were no issues.  Fax of the EKG sent to cardiology.     Guadalupe Dawn MD PGY-3 Family Medicine Resident [place]

## 2019-10-06 ENCOUNTER — Ambulatory Visit: Payer: Medicare HMO | Attending: Internal Medicine

## 2019-10-06 DIAGNOSIS — Z23 Encounter for immunization: Secondary | ICD-10-CM

## 2019-10-06 NOTE — Progress Notes (Signed)
   Covid-19 Vaccination Clinic  Name:  [name]    MRN: [id number] DOB: 10-05-1955  10/06/2019  [name] was observed post Covid-19 immunization for 15 minutes without incident. She was provided with Vaccine Information Sheet and instruction to access the V-Safe system.   [name] was instructed to call 911 with any severe reactions post vaccine: Marland Kitchen Difficulty breathing  . Swelling of face and throat  . A fast heartbeat  . A bad rash all over body  . Dizziness and weakness   Immunizations Administered    Name Date Dose VIS Date Route   Moderna COVID-19 Vaccine 10/06/2019  1:17 PM 0.5 mL 05/25/2019 Intramuscular   Manufacturer: Moderna   Lot: [id number]   [place]: [id number]

## 2019-10-07 ENCOUNTER — Other Ambulatory Visit: Payer: Self-pay

## 2019-10-11 DIAGNOSIS — Z9884 Bariatric surgery status: Secondary | ICD-10-CM | POA: Diagnosis not present

## 2019-10-11 DIAGNOSIS — K9089 Other intestinal malabsorption: Secondary | ICD-10-CM | POA: Diagnosis not present

## 2019-10-15 ENCOUNTER — Other Ambulatory Visit (HOSPITAL_COMMUNITY): Payer: Medicare HMO

## 2019-10-21 ENCOUNTER — Telehealth: Payer: Self-pay

## 2019-10-21 NOTE — Telephone Encounter (Signed)
Yes thanks willing to accept patient

## 2019-10-21 NOTE — Telephone Encounter (Signed)
Pt notified, will call back to schedule New patient appointment

## 2019-10-29 ENCOUNTER — Encounter: Payer: Self-pay | Admitting: Family Medicine

## 2019-10-29 ENCOUNTER — Other Ambulatory Visit: Payer: Self-pay

## 2019-10-29 ENCOUNTER — Ambulatory Visit (INDEPENDENT_AMBULATORY_CARE_PROVIDER_SITE_OTHER): Payer: Medicare HMO | Admitting: Family Medicine

## 2019-10-29 VITALS — BP 138/70 | HR 80

## 2019-10-29 DIAGNOSIS — I10 Essential (primary) hypertension: Secondary | ICD-10-CM | POA: Diagnosis not present

## 2019-10-29 DIAGNOSIS — E7800 Pure hypercholesterolemia, unspecified: Secondary | ICD-10-CM

## 2019-10-29 DIAGNOSIS — E1159 Type 2 diabetes mellitus with other circulatory complications: Secondary | ICD-10-CM | POA: Diagnosis not present

## 2019-10-29 DIAGNOSIS — E1140 Type 2 diabetes mellitus with diabetic neuropathy, unspecified: Secondary | ICD-10-CM

## 2019-10-29 DIAGNOSIS — F5089 Other specified eating disorder: Secondary | ICD-10-CM | POA: Diagnosis not present

## 2019-10-29 LAB — POCT GLYCOSYLATED HEMOGLOBIN (HGB A1C): HbA1c, POC (controlled diabetic range): 7.6 % — AB (ref 0.0–7.0)

## 2019-10-29 MED ORDER — LISINOPRIL-HYDROCHLOROTHIAZIDE 20-25 MG PO TABS: 1.0000 | ORAL_TABLET | Freq: Every day | ORAL | 3 refills | Status: DC

## 2019-10-29 MED ORDER — ATORVASTATIN CALCIUM 40 MG PO TABS: 40.0000 mg | ORAL_TABLET | Freq: Every day | ORAL | 0 refills | Status: DC

## 2019-10-29 MED ORDER — FERROUS SULFATE 324 (65 FE) MG PO TBEC: 1.0000 | DELAYED_RELEASE_TABLET | ORAL | 6 refills | Status: DC

## 2019-10-29 MED ORDER — METHOCARBAMOL 500 MG PO TABS: 500.0000 mg | ORAL_TABLET | Freq: Four times a day (QID) | ORAL | 0 refills | Status: DC | PRN

## 2019-10-29 MED ORDER — AMLODIPINE BESYLATE 10 MG PO TABS: 10.0000 mg | ORAL_TABLET | Freq: Every day | ORAL | 0 refills | Status: DC

## 2019-10-29 NOTE — Progress Notes (Signed)
    SUBJECTIVE:   CHIEF COMPLAINT / HPI:   Chewing ice: Patient reports that for about 3 weeks she has been chewing ice, reports having at least 1020-ounce cups of ice daily.  Additionally, she is having some fatigue and occasionally feels short of breath.  She has seen a hematologist [facility] in [place] before (Dr. Grayland Ormond) about a couple of years ago when she had the same issue.  At the time she was given IV Feraheme, her hemoglobin improved, and no further work-up was done.  She was told to come back and see them if he started chewing ice again.  She is not taking an iron supplement.  Denies any vaginal bleeding, blood in stools, or tarry black stools.  Diabetes: HbA1c stable at 7.6%. Taking Metformin 1076m once daily.  Denies any associated nausea, vomiting, or diarrhea.  Denies polyuria, polydipsia, polyphagia, and peripheral neuropathies.  Patient to continue dietary modifications, can consider increasing Metformin to 1000 mg twice daily patient's next HbA1c is the same or worse.   Hypertension: Patient's blood pressure today 138/70, well controlled with amlodipine and lisinopril-HCTZ.  Denies any headaches, vision changes, chest pain.  Health Maintenance: Needs DEXA scan, pneumonia vaccination, due for Pap smear in June 2021.  PERTINENT  PMH / PSH:  Type 2 diabetes OSA Dyslipidemia Mild cognitive impairment Hypertension Anemia with associated pica   OBJECTIVE:   BP 138/70   Pulse 80    Physical Exam: General: Well-appearing, no apparent distress HEENT: Normocephalic, atraumatic, normal pink/red coloring to bilateral conjunctiva Respiratory: CTA bilaterally, comfortable work of breathing Cardiac: RRR, S1-2 present, no murmurs appreciated, brisk capillary refill to bilateral upper extremities   ASSESSMENT/PLAN:   Pica Patient with 3 weeks of pica, physical exam findings reassuring with brisk capillary refill, no conjunctival pallor appreciated.  Patient  does have A. fib and is on Eliquis 5 mg daily, has a history of GERD.  Concerned patient could have GI bleed from stomach, large intestine; however, patient denies any blood in stool or black tarry stools.  Additionally, patient is status post bariatric surgery for many years prior, anemia could be due to short bowel and GI losses. -Ordering H/H, iron, TIBC, ferritin -Patient started on ferrous sulfate 324 daily -Referral made to patient's hematologist  Type 2 diabetes, controlled, with neuropathy ([place] HbA1c today stable 7.6%.  Patient taking Metformin 1000 mg daily.  No nausea, vomiting, diarrhea associated with the Metformin. -Do not wish to risk any changes today as patient is due for her knee replacement surgery on 11/16/2019 -Can consider increasing patient's Metformin up to 1000 mg twice daily for tighter control -Follow-up 3 months or earlier as needed  Hypertension associated with diabetes ([place] Blood pressure today 138/70 -Continue amlodipine 10 mg daily -Continue lisinopril-HCTZ -Can consider ambulatory blood pressure monitoring in the future, may switch medications to p.m. dosing.     [address]

## 2019-10-29 NOTE — Patient Instructions (Signed)
Thank you for coming in to see [place] today! Please see below to review our plan for today's visit:  1. You are having Pica which is the desire to eat unusual foods, such as ice, when your blood levels are going low. A way to fix this is to fix the anemia BY TAKING IRON SUPPLEMENTS! 2. We are referring you to the oncologist (blood specialist) to further evaluate and treat your anemia. 3. We are checking iron and other blood levels today.   HbA1c of 7.6%! Try limiting the sweets in your diet!  Please call the clinic at [phone] if your symptoms worsen or you have any concerns. It was our pleasure to serve you!  Dr. Milus Banister [facility]

## 2019-10-30 LAB — IRON AND TIBC
Iron Saturation: 8 % — CL (ref 15–55)
Iron: 28 ug/dL (ref 27–139)
Total Iron Binding Capacity: 372 ug/dL (ref 250–450)
UIBC: 344 ug/dL (ref 118–369)

## 2019-10-30 LAB — HEMOGLOBIN AND HEMATOCRIT, BLOOD
Hematocrit: 35.7 % (ref 34.0–46.6)
Hemoglobin: 11.2 g/dL (ref 11.1–15.9)

## 2019-10-30 LAB — FERRITIN: Ferritin: 26 ng/mL (ref 15–150)

## 2019-11-01 NOTE — Progress Notes (Signed)
Labs were much better than I anticipated, not exactly sure why she's having the chronic anemia. Shared results with her, instructed her to take Iron Supplement every other day and can follow up with Hematologist Dr. Grayland Ormond (previous doc) in [place] as scheduled.   Milus Banister, [place], PGY-2 11/01/2019 3:34 PM

## 2019-11-07 NOTE — Progress Notes (Signed)
[facility]  [phone] [phone]) [phone]  ID: [name] OB: [birth year]  MR#: [id number]  IWP#:[id number]  Patient Care Team: Daisy Floro, DO as PCP - General (Family Medicine) Kennith Center, RD as Dietitian (Family Medicine) Macarthur Critchley, [place] as Referring Physician (Optometry) Duke Salvia Venia Carbon, MD as Attending Physician (Bariatrics) Marlou Sa, Tonna Corner, MD as Consulting Physician (Orthopedic Surgery) Lloyd Huger, MD as Consulting Physician (Oncology)  CHIEF COMPLAINT:  Iron deficiency anemia  INTERVAL HISTORY: Patient is a 64 year old female who was last evaluated in clinic greater than 2 years ago.  She was referred back for declining hemoglobin, iron stores, as well as symptoms of pica eating ice on a regular basis.  She also complains of increased weakness and fatigue.  She has no neurologic complaints.  She denies any recent fevers or illnesses.  She endorses intentional 10 pound weight loss.  She has no chest pain, shortness of breath, cough, or hemoptysis.  She has no nausea, vomiting, constipation, or diarrhea.  She has no melena or hematochezia.  She has no urinary complaints.  Patient otherwise feels well and offers no further specific complaints today.  REVIEW OF SYSTEMS:   Review of Systems  Constitutional: Positive for malaise/fatigue and weight loss. Negative for fever.  Respiratory: Negative.  Negative for cough, hemoptysis and shortness of breath.   Cardiovascular: Negative.  Negative for chest pain and leg swelling.  Gastrointestinal: Negative.  Negative for abdominal pain, blood in stool and melena.  Genitourinary: Negative.  Negative for hematuria.  Musculoskeletal: Positive for joint pain. Negative for back pain.  Skin: Negative.  Negative for rash.  Neurological: Negative.  Negative for dizziness, focal weakness, weakness and headaches.  Psychiatric/Behavioral: Negative.  The patient is not nervous/anxious.     As  per HPI. Otherwise, a complete review of systems is negative.  PAST MEDICAL HISTORY: Past Medical History:  Diagnosis Date  . Allergy   . ANEMIA, PERNICIOUS, HX OF 05/13/2007  . Anxiety   . Arthritis   . ASYMPTOMATIC POSTMENOPAUSAL STATUS 02/18/2008  . BACK PAIN, LUMBAR 11/19/2007  . Bowel incontinence   . Cataract   . Cellulitis of left lower leg 10/28/2013  . Cellulitis of leg, right 10/11/2010  . DEPRESSION, CHRONIC 10/06/2007  . Diabetes mellitus without complication ([place])   . DIABETES MELLITUS, WITH NEUROLOGICAL COMPLICATIONS 10/27/3974  . Diabetic neuropathy ([place])   . DYSLIPIDEMIA 05/13/2007  . Family history of colon cancer   . Fecal soiling 06/28/2014  . Frozen shoulder    Lt  . Full dentures   . GERD (gastroesophageal reflux disease)   . GOITER, MULTINODULAR 05/13/2007  . Headache   . Hx of colonic polyps 12/04/2010  . HYPERCHOLESTEROLEMIA 03/20/2010  . HYPERTENSION 03/20/2010  . Lower back pain   . NASH (nonalcoholic steatohepatitis)   . OA (osteoarthritis)   . OBSTRUCTIVE SLEEP APNEA 06/12/2007   uses CPAP.  Marland Kitchen PERIPHERAL NEUROPATHY 01/12/2009  . Peripheral vascular disease ([place])   . Pneumonia   . PSORIASIS 05/28/2010  . Renal insufficiency    stage 1 kd  . Sleep apnea   . UNSPECIFIED VENOUS INSUFFICIENCY 05/28/2010  . Wears glasses     PAST SURGICAL HISTORY: Past Surgical History:  Procedure Laterality Date  . CATARACT EXTRACTION W/ INTRAOCULAR LENS  IMPLANT, BILATERAL    . COLONOSCOPY    . ELECTROCARDIOGRAM  04/16/2006  . EXAMINATION UNDER ANESTHESIA N/A 06/29/2014   Procedure: EXAM UNDER ANESTHESIA;  Surgeon: Janyth Contes, MD;  Location: [facility]  ORS;  Service: Gynecology;  Laterality: N/A;  . Exercise myoview  01/24/2005  . FLEXIBLE SIGMOIDOSCOPY    . GASTRIC BYPASS  11/09/2013  . GASTRIC BYPASS    . JOINT REPLACEMENT    . KNEE ARTHROSCOPY  2003   right  . LAPAROSCOPY N/A 03/12/2016   Procedure: LAPAROSCOPIC ANASTOMOSIS OF INTESTINE (ENTEROENTEROSTOMY);   Surgeon: Ladora Daniel, MD;  Location: [facility] ORS;  Service: General;  Laterality: N/A;  . LEG SURGERY     metal and pins in lower left leg  . mrsa Right    arm  . MULTIPLE TOOTH EXTRACTIONS    . ROTATOR CUFF REPAIR  2008   right  . SHOULDER ARTHROSCOPY W/ ROTATOR CUFF REPAIR Right   . stab phlebectomy  Right 02/10/2019   stab phlebectomy > 20 incisions right leg by [name] MD   . TONSILLECTOMY    . TOTAL KNEE ARTHROPLASTY Left 06/30/2018  . TOTAL KNEE ARTHROPLASTY Left 06/30/2018   Procedure: LEFT TOTAL KNEE ARTHROPLASTY;  Surgeon: Meredith Pel, MD;  Location: [facility];  Service: Orthopedics;  Laterality: Left;  Marland Kitchen VARICOSE VEIN SURGERY     Remotef    FAMILY HISTORY: Family History  Problem Relation Age of Onset  . Hyperlipidemia Father   . Hypertension Father   . Cirrhosis Father   . Colon cancer Father 46       d. 18  . Colon cancer Sister 80       d. 44  . Bipolar disorder Sister   . Aneurysm Mother        brain  . Cerebral aneurysm Mother   . Colon cancer Sister 51       d. 32  . Bipolar disorder Sister   . Cancer Paternal Aunt        NOS, ? colon  . Congestive Heart Failure Maternal Grandmother   . Drug abuse Neg Hx   . CAD Neg Hx   . Stomach cancer Neg Hx     ADVANCED DIRECTIVES (Y/N):  N  HEALTH MAINTENANCE: Social History   Tobacco Use  . Smoking status: Former Smoker    Packs/day: 2.00    Years: 24.00    Pack years: 48.00    Types: Cigarettes    Start date: 06/24/1970    Quit date: 08/06/1994    Years since quitting: 25.2  . Smokeless tobacco: Never Used  Substance Use Topics  . Alcohol use: No    Alcohol/week: 0.0 standard drinks  . Drug use: No     Colonoscopy:  PAP:  Bone density:  Lipid panel:  Allergies  Allergen Reactions  . Cephalexin Hives and Rash    Denies Airway involvement  . Latex Rash  . Neomycin-Bacitracin Zn-Polymyx Rash  . Tape Rash    Latex Prefers paper tape    Current Outpatient Medications   Medication Sig Dispense Refill  . amLODipine (NORVASC) 10 MG tablet Take 1 tablet (10 mg total) by mouth daily. 90 tablet 0  . apixaban (ELIQUIS) 5 MG TABS tablet Take 1 tablet (5 mg total) by mouth 2 (two) times daily. 60 tablet 0  . atorvastatin (LIPITOR) 40 MG tablet Take 1 tablet (40 mg total) by mouth daily. 90 tablet 0  . gabapentin (NEURONTIN) 100 MG capsule Take 100 mg by mouth every morning.    . gabapentin (NEURONTIN) 300 MG capsule Take 300 mg by mouth at bedtime.    Marland Kitchen lisinopril-hydrochlorothiazide (ZESTORETIC) 20-25 MG tablet Take 1 tablet by mouth daily.  90 tablet 3  . metFORMIN (GLUCOPHAGE) 1000 MG tablet Take 1 tablet (1,000 mg total) by mouth daily with breakfast. 90 tablet 2  . methocarbamol (ROBAXIN) 500 MG tablet Take 1 tablet (500 mg total) by mouth every 6 (six) hours as needed for muscle spasms. 120 tablet 0  . multivitamin-iron-minerals-folic acid (CENTRUM) chewable tablet Chew 1 tablet by mouth daily.     . NON FORMULARY Diet type:  NAS    . sertraline (ZOLOFT) 100 MG tablet Take 1 tablet (100 mg total) by mouth daily. 90 tablet 1  . Teduglutide, rDNA, (GATTEX) 5 MG KIT Inject 5 mg into the skin daily.    . ferrous sulfate 324 (65 Fe) MG TBEC Take 1 tablet (325 mg total) by mouth every other day. (Patient not taking: Reported on 11/09/2019) 30 tablet 6  . glucose blood (ACCU-CHEK AVIVA PLUS) test strip Use to check sugar once daily in the morning (Patient not taking: Reported on 11/09/2019) 100 each 0   No current facility-administered medications for this visit.    OBJECTIVE: Vitals:   11/09/19 1137  BP: 127/68  Pulse: 68  Resp: 20  Temp: 98.9 F (37.2 C)  SpO2: 96%     Body mass index is 35.81 kg/m.    ECOG FS:1 - Symptomatic but completely ambulatory  General: Well-developed, well-nourished, no acute distress. Eyes: Pink conjunctiva, anicteric sclera. HEENT: Normocephalic, moist mucous membranes. Lungs: No audible wheezing or coughing. Heart: Regular  rate and rhythm. Abdomen: Soft, nontender, no obvious distention. Musculoskeletal: No edema, cyanosis, or clubbing. Neuro: Alert, answering all questions appropriately. Cranial nerves grossly intact. Skin: No rashes or petechiae noted. Psych: Normal affect. Lymphatics: No cervical, calvicular, axillary or inguinal LAD.   LAB RESULTS:  Lab Results  Component Value Date   NA 140 07/12/2019   K 4.8 07/12/2019   CL 104 07/12/2019   CO2 26 07/12/2019   GLUCOSE 142 (H) 07/12/2019   BUN 18 07/12/2019   CREATININE 1.06 (H) 07/12/2019   CALCIUM 10.5 (H) 07/12/2019   PROT 6.8 05/07/2017   ALBUMIN 3.6 03/13/2016   AST 49 (H) 03/13/2016   ALT 8 05/07/2017   ALKPHOS 117 (H) 05/07/2017   BILITOT 0.5 03/13/2016   GFRNONAA 56 (L) 07/12/2019   GFRAA 64 07/12/2019    Lab Results  Component Value Date   WBC 7.8 06/19/2018   NEUTROABS 3.2 04/01/2017   HGB 11.2 10/29/2019   HCT 35.7 10/29/2019   MCV 83.4 06/19/2018   PLT 219 06/19/2018     STUDIES: No results found.  ASSESSMENT: Iron deficiency anemia.  PLAN:    1.  Iron deficiency anemia: Patient's most recent hemoglobin was reported 11.2, and her iron saturation is significantly decreased at 8%.  Ferritin is 23.  Patient is also symptomatic with increased weakness and fatigue as well as symptoms of pica.  She also reports she is undergoing a knee replacement surgery on Nov 16, 2019.  Return to clinic later this week for 510 mg IV Feraheme.  Patient would then return to clinic on Nov 15, 2019 1 day before surgery for second infusion.  She would then return to clinic in 3 months with repeat laboratory work, further evaluation, and consideration of additional treatment. 2.  Knee pain: Patient is having total knee replacement next week.  I spent a total of 45 minutes reviewing chart data, face-to-face evaluation with the patient, counseling and coordination of care as detailed above.   Patient expressed understanding and was in  agreement  with this plan. She also understands that She can call clinic at any time with any questions, concerns, or complaints.    Lloyd Huger, MD   11/09/2019 12:32 PM

## 2019-11-09 ENCOUNTER — Encounter: Payer: Self-pay | Admitting: Oncology

## 2019-11-09 ENCOUNTER — Encounter: Payer: Self-pay | Admitting: Family Medicine

## 2019-11-09 ENCOUNTER — Other Ambulatory Visit: Payer: Self-pay

## 2019-11-09 ENCOUNTER — Inpatient Hospital Stay: Payer: Medicare HMO | Attending: Oncology | Admitting: Oncology

## 2019-11-09 VITALS — BP 127/68 | HR 68 | Temp 98.9°F | Resp 20 | Wt 235.5 lb

## 2019-11-09 DIAGNOSIS — D509 Iron deficiency anemia, unspecified: Secondary | ICD-10-CM | POA: Diagnosis not present

## 2019-11-09 DIAGNOSIS — I10 Essential (primary) hypertension: Secondary | ICD-10-CM | POA: Diagnosis not present

## 2019-11-09 DIAGNOSIS — E119 Type 2 diabetes mellitus without complications: Secondary | ICD-10-CM

## 2019-11-09 DIAGNOSIS — Z87891 Personal history of nicotine dependence: Secondary | ICD-10-CM | POA: Diagnosis not present

## 2019-11-09 DIAGNOSIS — Z79899 Other long term (current) drug therapy: Secondary | ICD-10-CM | POA: Diagnosis not present

## 2019-11-09 DIAGNOSIS — Z7984 Long term (current) use of oral hypoglycemic drugs: Secondary | ICD-10-CM | POA: Diagnosis not present

## 2019-11-09 NOTE — Assessment & Plan Note (Signed)
Blood pressure today 138/70 -Continue amlodipine 10 mg daily -Continue lisinopril-HCTZ -Can consider ambulatory blood pressure monitoring in the future, may switch medications to p.m. dosing.

## 2019-11-09 NOTE — Assessment & Plan Note (Signed)
HbA1c today stable 7.6%.  Patient taking Metformin 1000 mg daily.  No nausea, vomiting, diarrhea associated with the Metformin. -Do not wish to risk any changes today as patient is due for her knee replacement surgery on 11/16/2019 -Can consider increasing patient's Metformin up to 1000 mg twice daily for tighter control -Follow-up 3 months or earlier as needed

## 2019-11-09 NOTE — Pre-Procedure Instructions (Signed)
Your procedure is scheduled on Tuesday, Nov 16, 2019 from 07:30 AM- 10:30 AM.  Report to [name] Main Entrance "A" at 05:30 A.M., and check in at the Admitting office.  Call this number if you have problems the morning of surgery:  [phone]  Call [phone] if you have any questions prior to your surgery date Monday-Friday 8am-4pm.    Remember:  Do not eat after midnight the night before your surgery.  You may drink clear liquids until 04:30 AM the morning of your surgery.    Clear liquids allowed are: Water, Non-Citrus Juices (without pulp), Carbonated Beverages, Clear Tea, Black Coffee Only, and Gatorade.   Enhanced Recovery after Surgery for Orthopedics Enhanced Recovery after Surgery is a protocol used to improve the stress on your body and your recovery after surgery.  Patient Instructions  . The night before surgery:  o No food after midnight. ONLY clear liquids after midnight.   . The day of surgery (if you have diabetes): o  o Drink ONE (1) Gatorade 2 (G2) by 04:30 AM. o This drink was given to you during your hospital  pre-op appointment visit.  o Color of the Gatorade may vary. Red is not allowed. o Nothing else to drink after completing the  Gatorade 2 (G2).         If you have questions, please contact your surgeon's office.     Take these medicines the morning of surgery with A SIP OF WATER: amLODipine (NORVASC) atorvastatin (LIPITOR) gabapentin (NEURONTIN) sertraline (ZOLOFT) Teduglutide, rDNA, (GATTEX)   IF NEEDED: methocarbamol (ROBAXIN)  *STOP  apixaban (ELIQUIS) 3 DAYS PRIOR TO SURGERY.*  As of today, STOP taking any Aspirin (unless otherwise instructed by your surgeon) and Aspirin containing products, Aleve, Naproxen, Ibuprofen, Motrin, Advil, Goody's, BC's, all herbal medications, fish oil, and all vitamins.   WHAT DO I DO ABOUT MY DIABETES MEDICATION?  Marland Kitchen Do not take metFORMIN (GLUCOPHAGE) the morning of surgery.   HOW TO MANAGE  YOUR DIABETES BEFORE AND AFTER SURGERY  Why is it important to control my blood sugar before and after surgery? . Improving blood sugar levels before and after surgery helps healing and can limit problems. . A way of improving blood sugar control is eating a healthy diet by: o  Eating less sugar and carbohydrates o  Increasing activity/exercise o  Talking with your doctor about reaching your blood sugar goals . High blood sugars (greater than 180 mg/dL) can raise your risk of infections and slow your recovery, so you will need to focus on controlling your diabetes during the weeks before surgery. . Make sure that the doctor who takes care of your diabetes knows about your planned surgery including the date and location.  How do I manage my blood sugar before surgery? . Check your blood sugar at least 4 times a day, starting 2 days before surgery, to make sure that the level is not too high or low. . Check your blood sugar the morning of your surgery when you wake up and every 2 hours until you get to the Short Stay unit. o If your blood sugar is less than 70 mg/dL, you will need to treat for low blood sugar: - Do not take insulin. - Treat a low blood sugar (less than 70 mg/dL) with  cup of clear juice (cranberry or apple), 4 glucose tablets, OR glucose gel. - Recheck blood sugar in 15 minutes after treatment (to make sure it is greater than 70 mg/dL). If  your blood sugar is not greater than 70 mg/dL on recheck, call [phone] for further instructions. . Report your blood sugar to the short stay nurse when you get to Short Stay.  . If you are admitted to the hospital after surgery: o Your blood sugar will be checked by the staff and you will probably be given insulin after surgery (instead of oral diabetes medicines) to make sure you have good blood sugar levels. o The goal for blood sugar control after surgery is 80-180 mg/dL.          The Morning of Surgery:            Do not wear  jewelry, make up, or nail polish.            Do not wear lotions, powders, perfumes, or deodorant.            Do not shave 48 hours prior to surgery.              Do not bring valuables to the hospital.            [facility] is not responsible for any belongings or valuables.  Do NOT Smoke (Tobacco/Vapping) or drink Alcohol 24 hours prior to your procedure.  If you use a CPAP at night, you may bring all equipment for your overnight stay.   Contacts, glasses, dentures or bridgework may not be worn into surgery.      For patients admitted to the hospital, discharge time will be determined by your treatment team.   Patients discharged the day of surgery will not be allowed to drive home, and someone needs to stay with them for 24 hours.    Special instructions:   [place]- Preparing For Surgery  Before surgery, you can play an important role. Because skin is not sterile, your skin needs to be as free of germs as possible. You can reduce the number of germs on your skin by washing with CHG (chlorahexidine gluconate) Soap before surgery.  CHG is an antiseptic cleaner which kills germs and bonds with the skin to continue killing germs even after washing.    Oral Hygiene is also important to reduce your risk of infection.  Remember - BRUSH YOUR TEETH THE MORNING OF SURGERY WITH YOUR REGULAR TOOTHPASTE  Please do not use if you have an allergy to CHG or antibacterial soaps. If your skin becomes reddened/irritated stop using the CHG.  Do not shave (including legs and underarms) for at least 48 hours prior to first CHG shower. It is OK to shave your face.  Please follow these instructions carefully.   1. Shower the NIGHT BEFORE SURGERY and the MORNING OF SURGERY with CHG Soap.   2. If you chose to wash your hair, wash your hair first as usual with your normal shampoo.  3. After you shampoo, rinse your hair and body thoroughly to remove the shampoo.  4. Use CHG as you would any other  liquid soap. You can apply CHG directly to the skin and wash gently with a scrungie or a clean washcloth.   5. Apply the CHG Soap to your body ONLY FROM THE NECK DOWN.  Do not use on open wounds or open sores. Avoid contact with your eyes, ears, mouth and genitals (private parts). Wash Face and genitals (private parts)  with your normal soap.   6. Wash thoroughly, paying special attention to the area where your surgery will be performed.  7. Thoroughly rinse your  body with warm water from the neck down.  8. DO NOT shower/wash with your normal soap after using and rinsing off the CHG Soap.  9. Pat yourself dry with a CLEAN TOWEL.  10. Wear CLEAN PAJAMAS to bed the night before surgery, wear comfortable clothes the morning of surgery  11. Place CLEAN SHEETS on your bed the night of your first shower and DO NOT SLEEP WITH PETS.   Day of Surgery:   Do not apply any deodorants/lotions.  Please wear clean clothes to the hospital/surgery center.   Remember to brush your teeth WITH YOUR REGULAR TOOTHPASTE.   Please read over the following fact sheets that you were given.

## 2019-11-09 NOTE — Assessment & Plan Note (Signed)
Patient with 3 weeks of pica, physical exam findings reassuring with brisk capillary refill, no conjunctival pallor appreciated.  Patient does have A. fib and is on Eliquis 5 mg daily, has a history of GERD.  Concerned patient could have GI bleed from stomach, large intestine; however, patient denies any blood in stool or black tarry stools.  Additionally, patient is status post bariatric surgery for many years prior, anemia could be due to short bowel and GI losses. -Ordering H/H, iron, TIBC, ferritin -Patient started on ferrous sulfate 324 daily -Referral made to patient's hematologist

## 2019-11-10 ENCOUNTER — Inpatient Hospital Stay (HOSPITAL_COMMUNITY)
Admission: RE | Admit: 2019-11-10 | Discharge: 2019-11-10 | Disposition: A | Discharge status: Home / Self Care | Payer: Medicare HMO | Source: Ambulatory Visit

## 2019-11-11 ENCOUNTER — Inpatient Hospital Stay: Payer: Medicare HMO

## 2019-11-11 ENCOUNTER — Other Ambulatory Visit: Payer: Self-pay

## 2019-11-11 VITALS — BP 152/64 | HR 61 | Temp 96.3°F | Resp 16

## 2019-11-11 DIAGNOSIS — Z87891 Personal history of nicotine dependence: Secondary | ICD-10-CM | POA: Diagnosis not present

## 2019-11-11 DIAGNOSIS — E119 Type 2 diabetes mellitus without complications: Secondary | ICD-10-CM | POA: Diagnosis not present

## 2019-11-11 DIAGNOSIS — I10 Essential (primary) hypertension: Secondary | ICD-10-CM | POA: Diagnosis not present

## 2019-11-11 DIAGNOSIS — Z7984 Long term (current) use of oral hypoglycemic drugs: Secondary | ICD-10-CM | POA: Diagnosis not present

## 2019-11-11 DIAGNOSIS — D509 Iron deficiency anemia, unspecified: Secondary | ICD-10-CM | POA: Diagnosis not present

## 2019-11-11 MED ORDER — SODIUM CHLORIDE 0.9 % IV SOLN
510.0000 mg | Freq: Once | INTRAVENOUS | Status: AC
  Administered 2019-11-11: 510 mg via INTRAVENOUS
  Filled 2019-11-11: qty 510

## 2019-11-11 MED ORDER — SODIUM CHLORIDE 0.9 % IV SOLN
Freq: Once | INTRAVENOUS | Status: AC
  Filled 2019-11-11: qty 250

## 2019-11-11 NOTE — Pre-Procedure Instructions (Signed)
Report to [facility] Main Entrance "A" at 05:30 A.M., and check in at the Admitting office.   Your procedure is scheduled on Tuesday, Nov 16, 2019 from 07:30 AM- 10:30 AM.    Call this number if you have problems the morning of surgery:  [phone]  Call [phone] if you have any questions prior to your surgery date Monday-Friday 8am-4pm.   Remember:  Do not eat after midnight the night before your surgery.  You may drink clear liquids until 04:30 AM the morning of your surgery.    Clear liquids allowed are: Water, Non-Citrus Juices (without pulp), Carbonated Beverages, Clear Tea, Black Coffee Only, and Gatorade.   Enhanced Recovery after Surgery for Orthopedics Enhanced Recovery after Surgery is a protocol used to improve the stress on your body and your recovery after surgery.  Patient Instructions  . The night before surgery:  o No food after midnight. ONLY clear liquids after midnight.   . The day of surgery (if you have diabetes): o  o Drink ONE (1) Gatorade 2 (G2) by 04:30 AM. o This drink was given to you during your hospital  pre-op appointment visit.  o Color of the Gatorade may vary. Red is not allowed. o Nothing else to drink after completing the  Gatorade 2 (G2).         If you have questions, please contact your surgeon's office.     Take these medicines the morning of surgery with A SIP OF WATER: amLODipine (NORVASC) atorvastatin (LIPITOR) gabapentin (NEURONTIN) sertraline (ZOLOFT) Teduglutide, rDNA, (GATTEX)   IF NEEDED: methocarbamol (ROBAXIN)  *STOP  apixaban (ELIQUIS) 3 DAYS PRIOR TO SURGERY.*  As of today, STOP taking any Aspirin (unless otherwise instructed by your surgeon) and Aspirin containing products, Aleve, Naproxen, Ibuprofen, Motrin, Advil, Goody's, BC's, all herbal medications, fish oil, and all vitamins.   WHAT DO I DO ABOUT MY DIABETES MEDICATION?  Marland Kitchen Do not take metFORMIN (GLUCOPHAGE) the morning of surgery.   HOW TO  MANAGE YOUR DIABETES BEFORE AND AFTER SURGERY  Why is it important to control my blood sugar before and after surgery? . Improving blood sugar levels before and after surgery helps healing and can limit problems. . A way of improving blood sugar control is eating a healthy diet by: o  Eating less sugar and carbohydrates o  Increasing activity/exercise o  Talking with your doctor about reaching your blood sugar goals . High blood sugars (greater than 180 mg/dL) can raise your risk of infections and slow your recovery, so you will need to focus on controlling your diabetes during the weeks before surgery. . Make sure that the doctor who takes care of your diabetes knows about your planned surgery including the date and location.  How do I manage my blood sugar before surgery? . Check your blood sugar at least 4 times a day, starting 2 days before surgery, to make sure that the level is not too high or low. . Check your blood sugar the morning of your surgery when you wake up and every 2 hours until you get to the Short Stay unit. o If your blood sugar is less than 70 mg/dL, you will need to treat for low blood sugar: - Do not take insulin. - Treat a low blood sugar (less than 70 mg/dL) with  cup of clear juice (cranberry or apple), 4 glucose tablets, OR glucose gel. - Recheck blood sugar in 15 minutes after treatment (to make sure it is greater than 70 mg/dL).  If your blood sugar is not greater than 70 mg/dL on recheck, call [phone] for further instructions. . Report your blood sugar to the short stay nurse when you get to Short Stay.  . If you are admitted to the hospital after surgery: o Your blood sugar will be checked by the staff and you will probably be given insulin after surgery (instead of oral diabetes medicines) to make sure you have good blood sugar levels. o The goal for blood sugar control after surgery is 80-180 mg/dL.  Special instructions:   [facility]- Preparing For  Surgery  Before surgery, you can play an important role. Because skin is not sterile, your skin needs to be as free of germs as possible. You can reduce the number of germs on your skin by washing with CHG (chlorahexidine gluconate) Soap before surgery.  CHG is an antiseptic cleaner which kills germs and bonds with the skin to continue killing germs even after washing.    Oral Hygiene is also important to reduce your risk of infection.  Remember - BRUSH YOUR TEETH THE MORNING OF SURGERY WITH YOUR REGULAR TOOTHPASTE  Please do not use if you have an allergy to CHG or antibacterial soaps. If your skin becomes reddened/irritated stop using the CHG.  Do not shave (including legs and underarms) for at least 48 hours prior to first CHG shower. It is OK to shave your face.  Please follow these instructions carefully.   1. Shower the NIGHT BEFORE SURGERY and the MORNING OF SURGERY with CHG Soap.   2. If you chose to wash your hair, wash your hair first as usual with your normal shampoo.  3. After you shampoo,wash your face and private area with the soap you use at home, then rinse your hair and body thoroughly to remove the  Shampoo and soap.  4. Use CHG as you would any other liquid soap. You can apply CHG directly to the skin and wash gently with a scrungie or a clean washcloth.   5. Apply the CHG Soap to your body ONLY FROM THE NECK DOWN.  Do not use on open wounds or open sores. Avoid contact with your eyes, ears, mouth and genitals (private parts).   6. Wash thoroughly, paying special attention to the area where your surgery will be performed.  7. Thoroughly rinse your body with warm water from the neck down.  8. DO NOT shower/wash with your normal soap after using and rinsing off the CHG Soap.  9. Pat yourself dry with a CLEAN TOWEL.  10. Wear CLEAN PAJAMAS to bed the night before surgery, wear comfortable clothes the morning of surgery  11. Place CLEAN SHEETS on your bed the night of  your first shower and DO NOT SLEEP WITH PETS.  Day of Surgery:  Shower as instructed above.             Do not wear lotions, powders, perfumes, or deodorant. Please wear clean clothes to the hospital/surgery center.   Remember to brush your teeth WITH YOUR REGULAR TOOTHPASTE.              Do not wear jewelry, make up, or nail polish.            Do not wear lotions, powders, perfumes, or deodorant.            Do not shave 48 hours prior to surgery.              Do not bring valuables  to the hospital.            [facility] is not responsible for any belongings or valuables.  If you use a CPAP at night, you may bring all equipment for your overnight stay.   Contacts, glasses, dentures or bridgework may not be worn into surgery.      For patients admitted to the hospital, discharge time will be determined by your treatment team.   Patients discharged the day of surgery will not be allowed to drive home, and someone needs to stay with them for 24 hours. Please read over the following fact sheets that you were given.

## 2019-11-12 ENCOUNTER — Other Ambulatory Visit (HOSPITAL_COMMUNITY)
Admission: RE | Admit: 2019-11-12 | Discharge: 2019-11-12 | Disposition: A | Discharge status: Home / Self Care | Payer: Medicare HMO | Source: Ambulatory Visit | Attending: Orthopedic Surgery | Admitting: Orthopedic Surgery

## 2019-11-12 ENCOUNTER — Encounter (HOSPITAL_COMMUNITY)
Admission: RE | Admit: 2019-11-12 | Discharge: 2019-11-12 | Disposition: A | Discharge status: Home / Self Care | Payer: Medicare HMO | Source: Ambulatory Visit | Attending: Orthopedic Surgery | Admitting: Orthopedic Surgery

## 2019-11-12 ENCOUNTER — Encounter (HOSPITAL_COMMUNITY): Payer: Self-pay

## 2019-11-12 ENCOUNTER — Telehealth: Payer: Self-pay | Admitting: Family Medicine

## 2019-11-12 ENCOUNTER — Other Ambulatory Visit: Payer: Self-pay

## 2019-11-12 DIAGNOSIS — Z01812 Encounter for preprocedural laboratory examination: Secondary | ICD-10-CM | POA: Diagnosis not present

## 2019-11-12 DIAGNOSIS — Z20822 Contact with and (suspected) exposure to covid-19: Secondary | ICD-10-CM | POA: Insufficient documentation

## 2019-11-12 HISTORY — DX: Anemia, unspecified: D64.9

## 2019-11-12 HISTORY — DX: Personal history of other medical treatment: Z92.89

## 2019-11-12 LAB — URINALYSIS, ROUTINE W REFLEX MICROSCOPIC
Bilirubin Urine: NEGATIVE
Glucose, UA: NEGATIVE mg/dL
Hgb urine dipstick: NEGATIVE
Ketones, ur: NEGATIVE mg/dL
Nitrite: NEGATIVE
Protein, ur: NEGATIVE mg/dL
Specific Gravity, Urine: 1.014 (ref 1.005–1.030)
pH: 5 (ref 5.0–8.0)

## 2019-11-12 LAB — BASIC METABOLIC PANEL
Anion gap: 9 (ref 5–15)
BUN: 18 mg/dL (ref 8–23)
CO2: 25 mmol/L (ref 22–32)
Calcium: 10.8 mg/dL — ABNORMAL HIGH (ref 8.9–10.3)
Chloride: 106 mmol/L (ref 98–111)
Creatinine, Ser: 0.95 mg/dL (ref 0.44–1.00)
GFR calc Af Amer: >60 mL/min (ref >60)
GFR calc non Af Amer: >60 mL/min (ref >60)
Glucose, Bld: 130 mg/dL — ABNORMAL HIGH (ref 70–99)
Potassium: 4.1 mmol/L (ref 3.5–5.1)
Sodium: 140 mmol/L (ref 135–145)

## 2019-11-12 LAB — CBC
HCT: 42.9 % (ref 36.0–46.0)
Hemoglobin: 12.7 g/dL (ref 12.0–15.0)
MCH: 24.3 pg — ABNORMAL LOW (ref 26.0–34.0)
MCHC: 29.6 g/dL — ABNORMAL LOW (ref 30.0–36.0)
MCV: 82 fL (ref 80.0–100.0)
Platelets: 294 10*3/uL (ref 150–400)
RBC: 5.23 MIL/uL — ABNORMAL HIGH (ref 3.87–5.11)
RDW: 13.7 % (ref 11.5–15.5)
WBC: 5.7 10*3/uL (ref 4.0–10.5)
nRBC: 0 % (ref 0.0–0.2)

## 2019-11-12 LAB — SURGICAL PCR SCREEN
MRSA, PCR: NEGATIVE
Staphylococcus aureus: NEGATIVE

## 2019-11-12 LAB — SARS CORONAVIRUS 2 (TAT 6-24 HRS): SARS Coronavirus 2: NEGATIVE

## 2019-11-12 LAB — GLUCOSE, CAPILLARY: Glucose-Capillary: 121 mg/dL — ABNORMAL HIGH (ref 70–99)

## 2019-11-12 NOTE — Telephone Encounter (Signed)
Patient is having full knee replacement needs note to gym saying she will not be able to go there until September.  Gym name is [employer].  Please when papework is completed she will pick up She's having surgery  on May 25th she needs this completed prior to her surgery/ Patient's P#[phone].

## 2019-11-12 NOTE — Progress Notes (Addendum)
PCP - Dr.Hannah Ouida Sills  Cardiologist - Dr. Darlen Round  Chest x-ray - na  EKG - 09/29/2019  Stress Test - 2019- Care Everywhere  ECHO - 2012 Epic  Cardiac Cath - 2006- Epic  Sleep Study - yes CPAP - yes  LABS-CBC, BMP, PT, Urine Culture. PCR  ASA-na  Eliquis- hold 3 days prior to surgery  ERAS-yes, Gatorade 2  HA1C-7.6 on 5/189/2021- at PCP's office found in her notes. Fasting Blood Sugar - does not check - CBG is btroken Checks Blood Sugar ___0__ times a day  Anesthesia-  Pt denies having chest pain, sob, or fever at this time. All instructions explained to the pt, with a verbal understanding of the material. Pt agrees to go over the instructions while at home for a better understanding. Pt also instructed to self quarantine after being tested for COVID-19. The opportunity to ask questions was provided.

## 2019-11-12 NOTE — Telephone Encounter (Signed)
Will forward to MD to write this note. Celedonio Sortino,CMA

## 2019-11-13 LAB — URINE CULTURE: Culture: <10000 — AB

## 2019-11-15 ENCOUNTER — Other Ambulatory Visit: Payer: Self-pay

## 2019-11-15 ENCOUNTER — Inpatient Hospital Stay: Payer: Medicare HMO

## 2019-11-15 VITALS — BP 155/83 | HR 56 | Temp 97.2°F

## 2019-11-15 DIAGNOSIS — E119 Type 2 diabetes mellitus without complications: Secondary | ICD-10-CM | POA: Diagnosis not present

## 2019-11-15 DIAGNOSIS — Z87891 Personal history of nicotine dependence: Secondary | ICD-10-CM | POA: Diagnosis not present

## 2019-11-15 DIAGNOSIS — Z7984 Long term (current) use of oral hypoglycemic drugs: Secondary | ICD-10-CM | POA: Diagnosis not present

## 2019-11-15 DIAGNOSIS — D509 Iron deficiency anemia, unspecified: Secondary | ICD-10-CM

## 2019-11-15 DIAGNOSIS — I10 Essential (primary) hypertension: Secondary | ICD-10-CM | POA: Diagnosis not present

## 2019-11-15 MED ORDER — SODIUM CHLORIDE 0.9 % IV SOLN
510.0000 mg | Freq: Once | INTRAVENOUS | Status: AC
  Administered 2019-11-15: 510 mg via INTRAVENOUS
  Filled 2019-11-15: qty 510

## 2019-11-15 MED ORDER — SODIUM CHLORIDE 0.9 % IV SOLN
Freq: Once | INTRAVENOUS | Status: AC
  Filled 2019-11-15: qty 250

## 2019-11-15 NOTE — Progress Notes (Addendum)
Anesthesia Chart Review:  Pt follows with cardiologist Dr. Darlen Round for history of paroxysmal Afib on Eliquis, HTN. Clearance form dated 10/07/19 states pt is low to moderate risk from cardiac standpoint and may stop Eliquis 2 days preop. Copy on pt chart.   OSA on CPAP.  DMII, last A1c 7.6 on 10/29/19  Preop labs reviewed, unremarkable.   EKG 09/29/19: Afib. Rate 65.  Lexiscan Cardiolite stress test 04/08/2018 (care everywhere):  1. LV EF was 52% 2. Global left ventricular systolic function was , with an EF of 52%.Negative study for ischemia or infarct.  30 day event monitor completed March 14, 2018 (care everywhere): 1. The predominant rhythm was sinus. The patient's heart rate during the monitoring period ranged from 22-141 beats per minute. The average heart rate was around 60 beats per minute. 2. No significant ventricular ectopy was noted. 3. Patient was noted to have one episode of atrial fibrillation lasting 8 hours and 39 minutes with a maximum heart rate of 126 beats per minute, minimum heart rate of 37 beats per minute, and average heart rate of 68 beats per minute. No other supraventricular ectopy was noted. 4. First-degree AV block was noted. There was one sinus pause up to 2.7 seconds in length. 5. No symptoms were documented.  Recommendations: There is no indication for immediate permanent pacemaker placement. The patient had an approximately 2% burden of atrial fibrillation lasting up to 8 hours and 39 minutes. With female gender approaching age 54 and history of hypertension with suggest anticoagulation rather than antiplatelet therapy. Would suggest changing low-dose aspirin to Eliquis 5 milligrams twice a day. Consider Lexiscan Cardilate stress test to evaluate for any ischemic etiology of her atrial fibrillation. Would avoid any negative chronotropic agents at this time.  Echo 02/06/2018 (care everywhere):  1. The left ventricular chamber size is  mildly dilated. 2. Left ventricle septal thickness is normal. 3. The ejection fraction is estimated to be 55%. 4. Abnormal left ventricular diastolic filling is observed, consistent with impaired LV relaxation(Stage I). 5. The left atrium is mildly dilated. 6. There is a trivial amount of mitral regurgitation. 7. There is trivial tricuspid regurgitation. 8. The right ventricular systolic pressure is calculated at 39mHg.   JWynonia MustyMKaiser Foundation HospitalShort Stay Center/Anesthesiology Phone [phone]/24/2021 10:46 AM

## 2019-11-15 NOTE — H&P (Signed)
TOTAL KNEE ADMISSION H&P  Patient is being admitted for right total knee arthroplasty.  Subjective:  Chief Complaint:right knee pain.  HPI: [name], 64 y.o. female, has a history of pain and functional disability in the right knee due to arthritis and has failed non-surgical conservative treatments for greater than 12 weeks to includecorticosteriod injections, viscosupplementation injections and activity modification.  Onset of symptoms was gradual, starting 8 years ago with gradually worsening course since that time. The patient noted prior procedures on the knee to include  arthroscopy on the right knee(s).  Patient currently rates pain in the right knee(s) at 8 out of 10 with activity. Patient has night pain, worsening of pain with activity and weight bearing, pain that interferes with activities of daily living, pain with passive range of motion, crepitus and joint swelling.  Patient has evidence of subchondral sclerosis and joint space narrowing by imaging studies. This patient has had Good result with her left total knee replacement and desires right total knee replacement.. There is no active infection.  Patient Active Problem List   Diagnosis Date Noted  . Pica 10/29/2019  . Surgical counseling visit 10/02/2019  . Blood in urine 07/19/2019  . Hypothyroidism 07/19/2019  . Nausea 07/19/2019  . Sinus bradycardia 07/19/2019  . Vaginal irritation 07/19/2019  . Screening mammogram, encounter for 07/12/2019  . Hypertension associated with diabetes ([place]) 07/11/2018  . Dyslipidemia associated with type 2 diabetes mellitus ([place]) 07/11/2018  . Short bowel syndrome 07/11/2018  . Peripheral autonomic neuropathy due to diabetes mellitus ([place]) 07/11/2018  . Status post left knee replacement 07/11/2018  . Major depression in remission ([place]) 07/07/2018  . Primary osteoarthritis involving multiple joints 07/07/2018  . Coronary artery disease involving native coronary artery of native heart  without angina pectoris 07/07/2018  . Paroxysmal atrial fibrillation ([place]) 05/11/2018  . Mild cognitive impairment 04/30/2018  . Generalized abdominal pain 02/05/2018  . Spigelian hernia 02/05/2018  . Venous stasis dermatitis of right lower extremity 01/02/2018  . Genetic testing 11/24/2017  . Family history of colon cancer   . Dyslipidemia 04/15/2017  . Gastroesophageal reflux disease without esophagitis 04/15/2017  . Umbilical hernia without obstruction and without gangrene 04/15/2017  . B12 deficiency 12/13/2016  . Malabsorption 03/12/2016  . Tubular adenoma of colon 03/09/2016  . Vitamin D deficiency 03/09/2016  . Fecal incontinence 08/02/2015  . Stress 05/24/2015  . S/P bariatric surgery-duodenal switch with sleeve gastrectomy 12/04/2013  . Nonobstructive CAD  08/26/2013  . Iron deficiency anemia 06/19/2012  . Low back pain 03/05/2012  . PSORIASIS 05/28/2010  . HYPERCHOLESTEROLEMIA 03/20/2010  . Peripheral neuropathy 01/12/2009  . Type 2 diabetes, controlled, with neuropathy ([place]) 02/18/2008  . Chronic depression 10/06/2007  . OBSTRUCTIVE SLEEP APNEA 06/12/2007  . GOITER, MULTINODULAR 05/13/2007   Past Medical History:  Diagnosis Date  . Allergy   . Anemia   . ANEMIA, PERNICIOUS, HX OF 05/13/2007  . Anxiety   . Arthritis   . Arthritis of knee 06/30/2018  . ASYMPTOMATIC POSTMENOPAUSAL STATUS 02/18/2008  . BACK PAIN, LUMBAR 11/19/2007  . Bowel incontinence   . Cataract   . Cellulitis of left lower leg 10/28/2013  . Cellulitis of leg, right 10/11/2010  . Chronic dermatitis of hands 05/02/2009  . DEPRESSION, CHRONIC 10/06/2007  . Diabetes mellitus without complication ([place])   . DIABETES MELLITUS, WITH NEUROLOGICAL COMPLICATIONS 4/00/8676  . Diabetic neuropathy ([place])   . DYSLIPIDEMIA 05/13/2007  . Essential hypertension 03/20/2010  . Family history of colon cancer   .  Fecal soiling 06/28/2014  . Frozen shoulder    Lt  . Full dentures   . GERD (gastroesophageal reflux  disease)   . GI bleed 03/15/2016  . GOITER, MULTINODULAR 05/13/2007  . Headache   . History of blood transfusion   . Hx of colonic polyps 12/04/2010  . HYPERCHOLESTEROLEMIA 03/20/2010  . HYPERTENSION 03/20/2010  . Lower back pain   . NASH (nonalcoholic steatohepatitis)   . OA (osteoarthritis)   . OBSTRUCTIVE SLEEP APNEA 06/12/2007   uses CPAP.  Marland Kitchen PERIPHERAL NEUROPATHY 01/12/2009  . Peripheral vascular disease ([place])   . Pneumonia   . Primary osteoarthritis of left knee 04/23/2011   Previously seen by Dr. Alphonzo Severance. Will plan on repeat referral after trial injection.     Marland Kitchen PSORIASIS 05/28/2010  . Rectal prolapse 05/24/2015  . Rectovaginal fistula 05/25/2014  . Renal insufficiency    stage 1 kd  . Sleep apnea   . Unilateral primary osteoarthritis, left knee   . UNSPECIFIED VENOUS INSUFFICIENCY 05/28/2010  . UNSPECIFIED VENOUS INSUFFICIENCY 05/28/2010   Patient with frequent ulceration related to venous stasis-seen at wound care. Edema and Venous stasis changes due to this . Echo 04/16/11 with EF 60%. No signs diastolic dysfunction.       . Wears glasses     Past Surgical History:  Procedure Laterality Date  . CATARACT EXTRACTION W/ INTRAOCULAR LENS  IMPLANT, BILATERAL    . COLONOSCOPY    . ELECTROCARDIOGRAM  04/16/2006  . EXAMINATION UNDER ANESTHESIA N/A 06/29/2014   Procedure: EXAM UNDER ANESTHESIA;  Surgeon: Janyth Contes, MD;  Location: [facility];  Service: Gynecology;  Laterality: N/A;  . Exercise myoview  01/24/2005  . FLEXIBLE SIGMOIDOSCOPY    . GASTRIC BYPASS  11/09/2013  . GASTRIC BYPASS    . JOINT REPLACEMENT    . KNEE ARTHROSCOPY  2003   right  . LAPAROSCOPY N/A 03/12/2016   Procedure: LAPAROSCOPIC ANASTOMOSIS OF INTESTINE (ENTEROENTEROSTOMY);  Surgeon: Ladora Daniel, MD;  Location: [facility];  Service: General;  Laterality: N/A;  . LEG SURGERY Left    metal and pins in lower left leg  . mrsa Right    arm  . MULTIPLE TOOTH EXTRACTIONS    . SHOULDER ARTHROSCOPY W/  ROTATOR CUFF REPAIR Right   . stab phlebectomy  Right 02/10/2019   stab phlebectomy > 20 incisions right leg by [name] MD   . TONSILLECTOMY    . TOTAL KNEE ARTHROPLASTY Left 06/30/2018   Procedure: LEFT TOTAL KNEE ARTHROPLASTY;  Surgeon: Meredith Pel, MD;  Location: [facility];  Service: Orthopedics;  Laterality: Left;  Marland Kitchen VARICOSE VEIN SURGERY     Remotef    No current facility-administered medications for this encounter.   Current Outpatient Medications  Medication Sig Dispense Refill Last Dose  . amLODipine (NORVASC) 10 MG tablet Take 1 tablet (10 mg total) by mouth daily. 90 tablet 0   . apixaban (ELIQUIS) 5 MG TABS tablet Take 1 tablet (5 mg total) by mouth 2 (two) times daily. 60 tablet 0   . atorvastatin (LIPITOR) 40 MG tablet Take 1 tablet (40 mg total) by mouth daily. 90 tablet 0   . gabapentin (NEURONTIN) 300 MG capsule Take 300 mg by mouth at bedtime.     Marland Kitchen lisinopril-hydrochlorothiazide (ZESTORETIC) 20-25 MG tablet Take 1 tablet by mouth daily. 90 tablet 3   . metFORMIN (GLUCOPHAGE) 1000 MG tablet Take 1 tablet (1,000 mg total) by mouth daily with breakfast. 90 tablet 2   . methocarbamol (  ROBAXIN) 500 MG tablet Take 1 tablet (500 mg total) by mouth every 6 (six) hours as needed for muscle spasms. 120 tablet 0   . multivitamin-iron-minerals-folic acid (CENTRUM) chewable tablet Chew 1 tablet by mouth daily.      . sertraline (ZOLOFT) 100 MG tablet Take 1 tablet (100 mg total) by mouth daily. 90 tablet 1   . Teduglutide, rDNA, (GATTEX) 5 MG KIT Inject 5 mg into the skin daily.     . ferrous sulfate 324 (65 Fe) MG TBEC Take 1 tablet (325 mg total) by mouth every other day. (Patient not taking: Reported on 11/09/2019) 30 tablet 6   . gabapentin (NEURONTIN) 100 MG capsule Take 100 mg by mouth every morning.     Marland Kitchen glucose blood (ACCU-CHEK AVIVA PLUS) test strip Use to check sugar once daily in the morning (Patient not taking: Reported on 11/09/2019) 100 each 0   . NON FORMULARY  Diet type:  NAS      Allergies  Allergen Reactions  . Cephalexin Hives and Rash    Denies Airway involvement  . Latex Rash  . Neomycin-Bacitracin Zn-Polymyx Rash  . Tape Rash    Latex Prefers paper tape    Social History   Tobacco Use  . Smoking status: Former Smoker    Packs/day: 2.00    Years: 24.00    Pack years: 48.00    Types: Cigarettes    Start date: 06/24/1970    Quit date: 08/06/1994    Years since quitting: 25.2  . Smokeless tobacco: Never Used  Substance Use Topics  . Alcohol use: No    Alcohol/week: 0.0 standard drinks    Family History  Problem Relation Age of Onset  . Hyperlipidemia Father   . Hypertension Father   . Cirrhosis Father   . Colon cancer Father 61       d. 12  . Colon cancer Sister 62       d. 60  . Bipolar disorder Sister   . Aneurysm Mother        brain  . Cerebral aneurysm Mother   . Colon cancer Sister 36       d. 18  . Bipolar disorder Sister   . Cancer Paternal Aunt        NOS, ? colon  . Congestive Heart Failure Maternal Grandmother   . Drug abuse Neg Hx   . CAD Neg Hx   . Stomach cancer Neg Hx      Review of Systems  Musculoskeletal: Positive for arthralgias.  All other systems reviewed and are negative.   Objective:  Physical Exam  Constitutional: She appears well-developed.  HENT:  Head: Normocephalic.  Eyes: Pupils are equal, round, and reactive to light.  Cardiovascular: Normal rate.  Respiratory: Effort normal.  Musculoskeletal:     Cervical back: Normal range of motion.  Neurological: She is alert.  Skin: Skin is warm.  Psychiatric: She has a normal mood and affect.  Examination right knee demonstrates reasonable extension and flexion past 90.  Extensor mechanism is intact.  Patient has medial greater than lateral joint line tenderness.  Pedal pulses palpable.  No groin pain with internal X rotation of the leg.  No masses lymphadenopathy or skin changes noted in the right knee region. Vital signs in last 24  hours: Temp:  [97.2 F (36.2 C)] 97.2 F (36.2 C) (05/24 1442) Pulse Rate:  [56] 56 (05/24 1442) BP: (155)/(83) 155/83 (05/24 1442) SpO2:  [97 %] 97 % (05/24  1442)  Labs:   Estimated body mass index is 35.56 kg/m as calculated from the following:   Height as of 09/29/19: 5' 8"  (1.727 m).   Weight as of 11/12/19: 106.1 kg.   Imaging Review Plain radiographs demonstrate severe degenerative joint disease of the right knee(s). The overall alignment ismild varus. The bone quality appears to be good for age and reported activity level.      Assessment/Plan:  End stage arthritis, right knee   The patient history, physical examination, clinical judgment of the provider and imaging studies are consistent with end stage degenerative joint disease of the right knee(s) and total knee arthroplasty is deemed medically necessary. The treatment options including medical management, injection therapy arthroscopy and arthroplasty were discussed at length. The risks and benefits of total knee arthroplasty were presented and reviewed. The risks due to aseptic loosening, infection, stiffness, patella tracking problems, thromboembolic complications and other imponderables were discussed. The patient acknowledged the explanation, agreed to proceed with the plan and consent was signed. Patient is being admitted for inpatient treatment for surgery, pain control, PT, OT, prophylactic antibiotics, VTE prophylaxis, progressive ambulation and ADL's and discharge planning. The patient is planning to be discharged to skilled nursing facility     Patient's anticipated LOS is less than 2 midnights, meeting these requirements: - Younger than 74 - Lives within 1 hour of care - Has a competent adult at home to recover with post-op recover - NO history of  - Chronic pain requiring opiods  - Diabetes  - Coronary Artery Disease  - Heart failure  - Heart attack  - Stroke  - DVT/VTE  - Cardiac arrhythmia  -  Respiratory Failure/COPD  - Renal failure  - Anemia  - Advanced Liver disease

## 2019-11-15 NOTE — Anesthesia Preprocedure Evaluation (Addendum)
Anesthesia Evaluation  Patient identified by MRN, date of birth, ID band Patient awake    Reviewed: Allergy & Precautions, NPO status , Patient's Chart, lab work & pertinent test results  History of Anesthesia Complications Negative for: history of anesthetic complications  Airway Mallampati: I  TM Distance: >3 FB Neck ROM: Full    Dental  (+) Edentulous Upper, Edentulous Lower   Pulmonary sleep apnea and Continuous Positive Airway Pressure Ventilation , former smoker,  11/12/2019 SARS coronavirus NEG   breath sounds clear to auscultation       Cardiovascular hypertension, Pt. on medications (-) angina+ CAD (non-obstructive) and + Peripheral Vascular Disease  + dysrhythmias Atrial Fibrillation  Rhythm:Regular Rate:Normal  '19 STRESS: EF 52%, no ischemia   Neuro/Psych  Headaches, Anxiety Depression    GI/Hepatic GERD  Controlled,NASH S/p gastric bypass   Endo/Other  diabetes (glu 129), Oral Hypoglycemic AgentsHypothyroidism Morbid obesity  Renal/GU negative Renal ROS     Musculoskeletal  (+) Arthritis , Osteoarthritis,    Abdominal (+) + obese,   Peds  Hematology Eliquis: last dose Friday am   Anesthesia Other Findings   Reproductive/Obstetrics                           Anesthesia Physical Anesthesia Plan  ASA: III  Anesthesia Plan: Spinal   Post-op Pain Management:  Regional for Post-op pain   Induction:   PONV Risk Score and Plan: 2 and Ondansetron, Dexamethasone and Treatment may vary due to age or medical condition  Airway Management Planned: Natural Airway and Simple Face Mask  Additional Equipment:   Intra-op Plan:   Post-operative Plan:   Informed Consent: I have reviewed the patients History and Physical, chart, labs and discussed the procedure including the risks, benefits and alternatives for the proposed anesthesia with the patient or authorized representative who has  indicated his/her understanding and acceptance.       Plan Discussed with: CRNA and Surgeon  Anesthesia Plan Comments: (Plan routine monitors, SAB with adductor canal block for post op analgesia  PAT note by Karoline Caldwell, PA-C: Pt follows with cardiologist Dr. Darlen Round for history of paroxysmal Afib on Eliquis, HTN. Clearance form dated 10/07/19 states pt is low to moderate risk from cardiac standpoint and may stop Eliquis 2 days preop. Copy on pt chart.   OSA on CPAP.  DMII, last A1c 7.6 on 10/29/19  Preop labs reviewed, unremarkable.   EKG 09/29/19: Afib. Rate 65.  Lexiscan Cardiolite stress test 04/08/2018 (care everywhere):  1. LV EF was 52% 2. Global left ventricular systolic function was , with an EF of 52%.Negative study for ischemia or infarct.  30 day event monitor completed March 14, 2018 (care everywhere): 1. The predominant rhythm was sinus. The patient's heart rate during the monitoring period ranged from 22-141 beats per minute. The average heart rate was around 60 beats per minute. 2. No significant ventricular ectopy was noted. 3. Patient was noted to have one episode of atrial fibrillation lasting 8 hours and 39 minutes with a maximum heart rate of 126 beats per minute, minimum heart rate of 37 beats per minute, and average heart rate of 68 beats per minute. No other supraventricular ectopy was noted. 4. First-degree AV block was noted. There was one sinus pause up to 2.7 seconds in length. 5. No symptoms were documented.  Recommendations: There is no indication for immediate permanent pacemaker placement. The patient had an approximately 2% burden of atrial  fibrillation lasting up to 8 hours and 39 minutes. With female gender approaching age 67 and history of hypertension with suggest anticoagulation rather than antiplatelet therapy. Would suggest changing low-dose aspirin to Eliquis 5 milligrams twice a day. Consider Lexiscan Cardilate stress test  to evaluate for any ischemic etiology of her atrial fibrillation. Would avoid any negative chronotropic agents at this time.  Echo 02/06/2018 (care everywhere):  1. The left ventricular chamber size is mildly dilated. 2. Left ventricle septal thickness is normal. 3. The ejection fraction is estimated to be 55%. 4. Abnormal left ventricular diastolic filling is observed, consistent with impaired LV relaxation(Stage I). 5. The left atrium is mildly dilated. 6. There is a trivial amount of mitral regurgitation. 7. There is trivial tricuspid regurgitation. 8. The right ventricular systolic pressure is calculated at 7mHg.)      Anesthesia Quick Evaluation

## 2019-11-16 ENCOUNTER — Ambulatory Visit (HOSPITAL_COMMUNITY): Payer: Medicare HMO | Admitting: Physician Assistant

## 2019-11-16 ENCOUNTER — Inpatient Hospital Stay (HOSPITAL_COMMUNITY)
Admission: RE | Admit: 2019-11-16 | Discharge: 2019-11-23 | DRG: 470 | Disposition: A | Discharge status: Skilled Nursing Facility | Payer: Medicare HMO | Source: Ambulatory Visit | Attending: Orthopedic Surgery | Admitting: Orthopedic Surgery

## 2019-11-16 ENCOUNTER — Encounter (HOSPITAL_COMMUNITY)
Admission: RE | Disposition: A | Discharge status: Skilled Nursing Facility | Payer: Self-pay | Source: Ambulatory Visit | Attending: Orthopedic Surgery

## 2019-11-16 ENCOUNTER — Other Ambulatory Visit: Payer: Self-pay

## 2019-11-16 ENCOUNTER — Encounter (HOSPITAL_COMMUNITY): Payer: Self-pay | Admitting: Orthopedic Surgery

## 2019-11-16 DIAGNOSIS — R001 Bradycardia, unspecified: Secondary | ICD-10-CM

## 2019-11-16 DIAGNOSIS — I4819 Other persistent atrial fibrillation: Secondary | ICD-10-CM

## 2019-11-16 DIAGNOSIS — Z96651 Presence of right artificial knee joint: Secondary | ICD-10-CM

## 2019-11-16 DIAGNOSIS — E1143 Type 2 diabetes mellitus with diabetic autonomic (poly)neuropathy: Secondary | ICD-10-CM | POA: Diagnosis present

## 2019-11-16 DIAGNOSIS — I4430 Unspecified atrioventricular block: Secondary | ICD-10-CM | POA: Diagnosis not present

## 2019-11-16 DIAGNOSIS — E1169 Type 2 diabetes mellitus with other specified complication: Secondary | ICD-10-CM | POA: Diagnosis present

## 2019-11-16 DIAGNOSIS — K219 Gastro-esophageal reflux disease without esophagitis: Secondary | ICD-10-CM | POA: Diagnosis present

## 2019-11-16 DIAGNOSIS — Z87891 Personal history of nicotine dependence: Secondary | ICD-10-CM

## 2019-11-16 DIAGNOSIS — E039 Hypothyroidism, unspecified: Secondary | ICD-10-CM | POA: Diagnosis present

## 2019-11-16 DIAGNOSIS — K912 Postsurgical malabsorption, not elsewhere classified: Secondary | ICD-10-CM | POA: Diagnosis not present

## 2019-11-16 DIAGNOSIS — Z9841 Cataract extraction status, right eye: Secondary | ICD-10-CM

## 2019-11-16 DIAGNOSIS — I119 Hypertensive heart disease without heart failure: Secondary | ICD-10-CM

## 2019-11-16 DIAGNOSIS — Z83438 Family history of other disorder of lipoprotein metabolism and other lipidemia: Secondary | ICD-10-CM

## 2019-11-16 DIAGNOSIS — G4733 Obstructive sleep apnea (adult) (pediatric): Secondary | ICD-10-CM | POA: Diagnosis present

## 2019-11-16 DIAGNOSIS — E1159 Type 2 diabetes mellitus with other circulatory complications: Secondary | ICD-10-CM | POA: Diagnosis not present

## 2019-11-16 DIAGNOSIS — G8918 Other acute postprocedural pain: Secondary | ICD-10-CM | POA: Diagnosis not present

## 2019-11-16 DIAGNOSIS — L409 Psoriasis, unspecified: Secondary | ICD-10-CM | POA: Diagnosis present

## 2019-11-16 DIAGNOSIS — Z961 Presence of intraocular lens: Secondary | ICD-10-CM | POA: Diagnosis present

## 2019-11-16 DIAGNOSIS — Z91040 Latex allergy status: Secondary | ICD-10-CM | POA: Diagnosis not present

## 2019-11-16 DIAGNOSIS — E785 Hyperlipidemia, unspecified: Secondary | ICD-10-CM | POA: Diagnosis present

## 2019-11-16 DIAGNOSIS — R2681 Unsteadiness on feet: Secondary | ICD-10-CM | POA: Diagnosis not present

## 2019-11-16 DIAGNOSIS — Z6835 Body mass index (BMI) 35.0-35.9, adult: Secondary | ICD-10-CM | POA: Diagnosis not present

## 2019-11-16 DIAGNOSIS — Z7984 Long term (current) use of oral hypoglycemic drugs: Secondary | ICD-10-CM | POA: Diagnosis not present

## 2019-11-16 DIAGNOSIS — Z79899 Other long term (current) drug therapy: Secondary | ICD-10-CM

## 2019-11-16 DIAGNOSIS — Z9842 Cataract extraction status, left eye: Secondary | ICD-10-CM | POA: Diagnosis not present

## 2019-11-16 DIAGNOSIS — Z96652 Presence of left artificial knee joint: Secondary | ICD-10-CM | POA: Diagnosis present

## 2019-11-16 DIAGNOSIS — M1711 Unilateral primary osteoarthritis, right knee: Principal | ICD-10-CM | POA: Diagnosis present

## 2019-11-16 DIAGNOSIS — E7800 Pure hypercholesterolemia, unspecified: Secondary | ICD-10-CM | POA: Diagnosis present

## 2019-11-16 DIAGNOSIS — I2510 Atherosclerotic heart disease of native coronary artery without angina pectoris: Secondary | ICD-10-CM | POA: Diagnosis present

## 2019-11-16 DIAGNOSIS — I340 Nonrheumatic mitral (valve) insufficiency: Secondary | ICD-10-CM | POA: Diagnosis not present

## 2019-11-16 DIAGNOSIS — Z9884 Bariatric surgery status: Secondary | ICD-10-CM

## 2019-11-16 DIAGNOSIS — E669 Obesity, unspecified: Secondary | ICD-10-CM | POA: Diagnosis present

## 2019-11-16 DIAGNOSIS — I97191 Other postprocedural cardiac functional disturbances following other surgery: Secondary | ICD-10-CM | POA: Diagnosis not present

## 2019-11-16 DIAGNOSIS — Z7901 Long term (current) use of anticoagulants: Secondary | ICD-10-CM | POA: Diagnosis not present

## 2019-11-16 DIAGNOSIS — Z20822 Contact with and (suspected) exposure to covid-19: Secondary | ICD-10-CM | POA: Diagnosis present

## 2019-11-16 DIAGNOSIS — I152 Hypertension secondary to endocrine disorders: Secondary | ICD-10-CM | POA: Diagnosis present

## 2019-11-16 DIAGNOSIS — G3184 Mild cognitive impairment, so stated: Secondary | ICD-10-CM | POA: Diagnosis present

## 2019-11-16 DIAGNOSIS — Z888 Allergy status to other drugs, medicaments and biological substances status: Secondary | ICD-10-CM | POA: Diagnosis not present

## 2019-11-16 DIAGNOSIS — R2689 Other abnormalities of gait and mobility: Secondary | ICD-10-CM | POA: Diagnosis not present

## 2019-11-16 DIAGNOSIS — Z8249 Family history of ischemic heart disease and other diseases of the circulatory system: Secondary | ICD-10-CM

## 2019-11-16 DIAGNOSIS — I10 Essential (primary) hypertension: Secondary | ICD-10-CM | POA: Diagnosis not present

## 2019-11-16 DIAGNOSIS — M6281 Muscle weakness (generalized): Secondary | ICD-10-CM | POA: Diagnosis not present

## 2019-11-16 HISTORY — DX: Presence of right artificial knee joint: Z96.651

## 2019-11-16 HISTORY — PX: TOTAL KNEE ARTHROPLASTY: SHX125

## 2019-11-16 LAB — GLUCOSE, CAPILLARY
Glucose-Capillary: 129 mg/dL — ABNORMAL HIGH (ref 70–99)
Glucose-Capillary: 106 mg/dL — ABNORMAL HIGH (ref 70–99)
Glucose-Capillary: 204 mg/dL — ABNORMAL HIGH (ref 70–99)
Glucose-Capillary: 503 mg/dL (ref 70–99)

## 2019-11-16 LAB — PROTIME-INR
INR: 1 (ref 0.8–1.2)
Prothrombin Time: 12.6 seconds (ref 11.4–15.2)

## 2019-11-16 SURGERY — ARTHROPLASTY, KNEE, TOTAL
Anesthesia: Spinal | Site: Knee | Laterality: Right

## 2019-11-16 MED ORDER — ROPIVACAINE HCL 7.5 MG/ML IJ SOLN
INTRAMUSCULAR | Status: DC | PRN
  Administered 2019-11-16: 20 mL via PERINEURAL

## 2019-11-16 MED ORDER — APIXABAN 5 MG PO TABS
5.0000 mg | ORAL_TABLET | Freq: Two times a day (BID) | ORAL | Status: DC
  Administered 2019-11-16 – 2019-11-23 (×14): 5 mg via ORAL
  Filled 2019-11-16 (×14): qty 1

## 2019-11-16 MED ORDER — PROPOFOL 10 MG/ML IV BOLUS
INTRAVENOUS | Status: AC
  Filled 2019-11-16: qty 20

## 2019-11-16 MED ORDER — SODIUM CHLORIDE 0.9% FLUSH
INTRAVENOUS | Status: DC | PRN
  Administered 2019-11-16: 20 mL

## 2019-11-16 MED ORDER — METHOCARBAMOL 500 MG PO TABS
500.0000 mg | ORAL_TABLET | Freq: Four times a day (QID) | ORAL | Status: DC | PRN
  Administered 2019-11-16 – 2019-11-19 (×5): 500 mg via ORAL
  Filled 2019-11-16 (×5): qty 1

## 2019-11-16 MED ORDER — ATROPINE SULFATE 1 MG/10ML IJ SOSY: 1.0000 mg | PREFILLED_SYRINGE | Freq: Once | INTRAMUSCULAR | Status: DC

## 2019-11-16 MED ORDER — CLONIDINE HCL (ANALGESIA) 100 MCG/ML EP SOLN
EPIDURAL | Status: DC | PRN
  Administered 2019-11-16: 100 ug

## 2019-11-16 MED ORDER — LACTATED RINGERS IV SOLN: INTRAVENOUS | Status: DC

## 2019-11-16 MED ORDER — LISINOPRIL 20 MG PO TABS
20.0000 mg | ORAL_TABLET | Freq: Every day | ORAL | Status: DC
  Administered 2019-11-17 – 2019-11-23 (×7): 20 mg via ORAL
  Filled 2019-11-16 (×7): qty 1

## 2019-11-16 MED ORDER — HYDROMORPHONE HCL 1 MG/ML IJ SOLN
INTRAMUSCULAR | Status: AC
  Filled 2019-11-16: qty 1

## 2019-11-16 MED ORDER — GABAPENTIN 100 MG PO CAPS
100.0000 mg | ORAL_CAPSULE | ORAL | Status: DC
  Administered 2019-11-17 – 2019-11-23 (×7): 100 mg via ORAL
  Filled 2019-11-16 (×8): qty 1

## 2019-11-16 MED ORDER — GABAPENTIN 300 MG PO CAPS
300.0000 mg | ORAL_CAPSULE | Freq: Every day | ORAL | Status: DC
  Administered 2019-11-16 – 2019-11-22 (×7): 300 mg via ORAL
  Filled 2019-11-16 (×8): qty 1

## 2019-11-16 MED ORDER — PROPOFOL 500 MG/50ML IV EMUL
INTRAVENOUS | Status: DC | PRN
  Administered 2019-11-16: 75 ug/kg/min via INTRAVENOUS

## 2019-11-16 MED ORDER — SERTRALINE HCL 100 MG PO TABS
100.0000 mg | ORAL_TABLET | Freq: Every day | ORAL | Status: DC
  Administered 2019-11-17 – 2019-11-23 (×7): 100 mg via ORAL
  Filled 2019-11-16 (×7): qty 1

## 2019-11-16 MED ORDER — ORAL CARE MOUTH RINSE: 15.0000 mL | Freq: Once | OROMUCOSAL | Status: AC

## 2019-11-16 MED ORDER — MEPERIDINE HCL 25 MG/ML IJ SOLN: 6.2500 mg | INTRAMUSCULAR | Status: DC | PRN

## 2019-11-16 MED ORDER — BUPIVACAINE HCL (PF) 0.5 % IJ SOLN
INTRAMUSCULAR | Status: AC
  Filled 2019-11-16: qty 30

## 2019-11-16 MED ORDER — VANCOMYCIN HCL IN DEXTROSE 1-5 GM/200ML-% IV SOLN
1000.0000 mg | Freq: Two times a day (BID) | INTRAVENOUS | Status: AC
  Administered 2019-11-16: 1000 mg via INTRAVENOUS
  Filled 2019-11-16: qty 200

## 2019-11-16 MED ORDER — ATROPINE SULFATE 1 MG/10ML IJ SOSY
0.2000 mg | PREFILLED_SYRINGE | INTRAMUSCULAR | Status: DC | PRN
  Administered 2019-11-16 (×3): 0.2 mg via INTRAVENOUS

## 2019-11-16 MED ORDER — ONDANSETRON HCL 4 MG PO TABS: 4.0000 mg | ORAL_TABLET | Freq: Four times a day (QID) | ORAL | Status: DC | PRN

## 2019-11-16 MED ORDER — INSULIN ASPART 100 UNIT/ML ~~LOC~~ SOLN
4.0000 [IU] | Freq: Three times a day (TID) | SUBCUTANEOUS | Status: DC
  Administered 2019-11-16 – 2019-11-23 (×19): 4 [IU] via SUBCUTANEOUS

## 2019-11-16 MED ORDER — OXYCODONE HCL 5 MG PO TABS
5.0000 mg | ORAL_TABLET | ORAL | Status: DC | PRN
  Administered 2019-11-16 – 2019-11-19 (×5): 10 mg via ORAL
  Administered 2019-11-19 – 2019-11-21 (×3): 5 mg via ORAL
  Filled 2019-11-16: qty 2
  Filled 2019-11-16: qty 1
  Filled 2019-11-16 (×2): qty 2
  Filled 2019-11-16: qty 1
  Filled 2019-11-16 (×2): qty 2
  Filled 2019-11-16: qty 1

## 2019-11-16 MED ORDER — METHOCARBAMOL 1000 MG/10ML IJ SOLN
500.0000 mg | Freq: Four times a day (QID) | INTRAVENOUS | Status: DC | PRN
  Filled 2019-11-16: qty 5

## 2019-11-16 MED ORDER — ONDANSETRON HCL 4 MG/2ML IJ SOLN
4.0000 mg | Freq: Four times a day (QID) | INTRAMUSCULAR | Status: DC | PRN
  Administered 2019-11-17: 4 mg via INTRAVENOUS
  Filled 2019-11-16: qty 2

## 2019-11-16 MED ORDER — VANCOMYCIN HCL 1500 MG/300ML IV SOLN
1500.0000 mg | INTRAVENOUS | Status: AC
  Administered 2019-11-16: 1500 mg via INTRAVENOUS
  Filled 2019-11-16 (×2): qty 300

## 2019-11-16 MED ORDER — MIDAZOLAM HCL 2 MG/2ML IJ SOLN: 0.5000 mg | Freq: Once | INTRAMUSCULAR | Status: DC | PRN

## 2019-11-16 MED ORDER — PHENYLEPHRINE HCL-NACL 10-0.9 MG/250ML-% IV SOLN
INTRAVENOUS | Status: DC | PRN
  Administered 2019-11-16: 25 ug/min via INTRAVENOUS

## 2019-11-16 MED ORDER — BUPIVACAINE IN DEXTROSE 0.75-8.25 % IT SOLN
INTRATHECAL | Status: DC | PRN
  Administered 2019-11-16: 12 mg via INTRATHECAL

## 2019-11-16 MED ORDER — METOCLOPRAMIDE HCL 5 MG/ML IJ SOLN: 5.0000 mg | Freq: Three times a day (TID) | INTRAMUSCULAR | Status: DC | PRN

## 2019-11-16 MED ORDER — LISINOPRIL-HYDROCHLOROTHIAZIDE 20-25 MG PO TABS: 1.0000 | ORAL_TABLET | Freq: Every day | ORAL | Status: DC

## 2019-11-16 MED ORDER — 0.9 % SODIUM CHLORIDE (POUR BTL) OPTIME
TOPICAL | Status: DC | PRN
  Administered 2019-11-16 (×3): 1000 mL

## 2019-11-16 MED ORDER — FENTANYL CITRATE (PF) 250 MCG/5ML IJ SOLN
INTRAMUSCULAR | Status: AC
  Filled 2019-11-16: qty 5

## 2019-11-16 MED ORDER — VANCOMYCIN HCL 1000 MG IV SOLR
INTRAVENOUS | Status: AC
  Filled 2019-11-16: qty 1000

## 2019-11-16 MED ORDER — METOCLOPRAMIDE HCL 5 MG PO TABS: 5.0000 mg | ORAL_TABLET | Freq: Three times a day (TID) | ORAL | Status: DC | PRN

## 2019-11-16 MED ORDER — ATORVASTATIN CALCIUM 40 MG PO TABS
40.0000 mg | ORAL_TABLET | Freq: Every day | ORAL | Status: DC
  Administered 2019-11-17 – 2019-11-23 (×7): 40 mg via ORAL
  Filled 2019-11-16 (×7): qty 1

## 2019-11-16 MED ORDER — BUPIVACAINE LIPOSOME 1.3 % IJ SUSP
INTRAMUSCULAR | Status: DC | PRN
  Administered 2019-11-16: 20 mL

## 2019-11-16 MED ORDER — CELECOXIB 200 MG PO CAPS
200.0000 mg | ORAL_CAPSULE | Freq: Two times a day (BID) | ORAL | Status: DC
  Administered 2019-11-16 – 2019-11-23 (×15): 200 mg via ORAL
  Filled 2019-11-16 (×16): qty 1

## 2019-11-16 MED ORDER — AMLODIPINE BESYLATE 10 MG PO TABS
10.0000 mg | ORAL_TABLET | Freq: Every day | ORAL | Status: DC
  Administered 2019-11-17 – 2019-11-23 (×7): 10 mg via ORAL
  Filled 2019-11-16 (×7): qty 1

## 2019-11-16 MED ORDER — INSULIN ASPART 100 UNIT/ML ~~LOC~~ SOLN
0.0000 [IU] | Freq: Three times a day (TID) | SUBCUTANEOUS | Status: DC
  Administered 2019-11-16: 5 [IU] via SUBCUTANEOUS
  Administered 2019-11-16: 15 [IU] via SUBCUTANEOUS
  Administered 2019-11-17 – 2019-11-18 (×2): 2 [IU] via SUBCUTANEOUS
  Administered 2019-11-18 – 2019-11-19 (×3): 3 [IU] via SUBCUTANEOUS
  Administered 2019-11-19 – 2019-11-20 (×2): 2 [IU] via SUBCUTANEOUS
  Administered 2019-11-21: 5 [IU] via SUBCUTANEOUS
  Administered 2019-11-22 (×2): 2 [IU] via SUBCUTANEOUS
  Administered 2019-11-23: 3 [IU] via SUBCUTANEOUS

## 2019-11-16 MED ORDER — VANCOMYCIN HCL IN DEXTROSE 1-5 GM/200ML-% IV SOLN: 1000.0000 mg | INTRAVENOUS | Status: DC

## 2019-11-16 MED ORDER — BUPIVACAINE HCL 0.25 % IJ SOLN
INTRAMUSCULAR | Status: DC | PRN
  Administered 2019-11-16: 20 mL
  Administered 2019-11-16: 10 mL

## 2019-11-16 MED ORDER — MIDAZOLAM HCL 2 MG/2ML IJ SOLN
INTRAMUSCULAR | Status: AC
  Filled 2019-11-16: qty 2

## 2019-11-16 MED ORDER — TRANEXAMIC ACID-NACL 1000-0.7 MG/100ML-% IV SOLN
1000.0000 mg | INTRAVENOUS | Status: AC
  Administered 2019-11-16: 1000 mg via INTRAVENOUS
  Filled 2019-11-16: qty 100

## 2019-11-16 MED ORDER — VANCOMYCIN HCL 1000 MG IV SOLR
INTRAVENOUS | Status: DC | PRN
  Administered 2019-11-16: 1000 mg

## 2019-11-16 MED ORDER — DOCUSATE SODIUM 100 MG PO CAPS
100.0000 mg | ORAL_CAPSULE | Freq: Two times a day (BID) | ORAL | Status: DC
  Administered 2019-11-16 – 2019-11-23 (×8): 100 mg via ORAL
  Filled 2019-11-16 (×12): qty 1

## 2019-11-16 MED ORDER — PHENOL 1.4 % MT LIQD: 1.0000 | OROMUCOSAL | Status: DC | PRN

## 2019-11-16 MED ORDER — IRRISEPT - 450ML BOTTLE WITH 0.05% CHG IN STERILE WATER, USP 99.95% OPTIME
TOPICAL | Status: DC | PRN
  Administered 2019-11-16: 450 mL

## 2019-11-16 MED ORDER — MORPHINE SULFATE (PF) 4 MG/ML IV SOLN
INTRAVENOUS | Status: AC
  Filled 2019-11-16: qty 2

## 2019-11-16 MED ORDER — MENTHOL 3 MG MT LOZG: 1.0000 | LOZENGE | OROMUCOSAL | Status: DC | PRN

## 2019-11-16 MED ORDER — PROMETHAZINE HCL 25 MG/ML IJ SOLN: 6.2500 mg | INTRAMUSCULAR | Status: DC | PRN

## 2019-11-16 MED ORDER — BUPIVACAINE LIPOSOME 1.3 % IJ SUSP
20.0000 mL | Freq: Once | INTRAMUSCULAR | Status: DC
  Filled 2019-11-16: qty 20

## 2019-11-16 MED ORDER — HYDROCHLOROTHIAZIDE 25 MG PO TABS
25.0000 mg | ORAL_TABLET | Freq: Every day | ORAL | Status: DC
  Administered 2019-11-17 – 2019-11-23 (×7): 25 mg via ORAL
  Filled 2019-11-16 (×7): qty 1

## 2019-11-16 MED ORDER — HYDROMORPHONE HCL 1 MG/ML IJ SOLN
0.2500 mg | INTRAMUSCULAR | Status: DC | PRN
  Administered 2019-11-16: 0.25 mg via INTRAVENOUS

## 2019-11-16 MED ORDER — FENTANYL CITRATE (PF) 100 MCG/2ML IJ SOLN
INTRAMUSCULAR | Status: DC | PRN
  Administered 2019-11-16 (×4): 50 ug via INTRAVENOUS

## 2019-11-16 MED ORDER — POVIDONE-IODINE 10 % EX SWAB
2.0000 "application " | Freq: Once | CUTANEOUS | Status: AC
  Administered 2019-11-16: 2 via TOPICAL

## 2019-11-16 MED ORDER — MIDAZOLAM HCL 5 MG/5ML IJ SOLN
INTRAMUSCULAR | Status: DC | PRN
  Administered 2019-11-16: 2 mg via INTRAVENOUS

## 2019-11-16 MED ORDER — ONDANSETRON HCL 4 MG/2ML IJ SOLN
INTRAMUSCULAR | Status: DC | PRN
  Administered 2019-11-16: 4 mg via INTRAVENOUS

## 2019-11-16 MED ORDER — LACTATED RINGERS IV SOLN: INTRAVENOUS | Status: DC | PRN

## 2019-11-16 MED ORDER — METFORMIN HCL 500 MG PO TABS
1000.0000 mg | ORAL_TABLET | Freq: Every day | ORAL | Status: DC
  Administered 2019-11-17 – 2019-11-23 (×7): 1000 mg via ORAL
  Filled 2019-11-16 (×7): qty 2

## 2019-11-16 MED ORDER — TRANEXAMIC ACID 1000 MG/10ML IV SOLN: INTRAVENOUS | Status: DC | PRN

## 2019-11-16 MED ORDER — ACETAMINOPHEN 500 MG PO TABS
1000.0000 mg | ORAL_TABLET | Freq: Four times a day (QID) | ORAL | Status: AC
  Administered 2019-11-16 – 2019-11-17 (×4): 1000 mg via ORAL
  Filled 2019-11-16 (×4): qty 2

## 2019-11-16 MED ORDER — CHLORHEXIDINE GLUCONATE 0.12 % MT SOLN
15.0000 mL | Freq: Once | OROMUCOSAL | Status: AC
  Administered 2019-11-16: 15 mL via OROMUCOSAL
  Filled 2019-11-16: qty 15

## 2019-11-16 MED ORDER — MORPHINE SULFATE (PF) 4 MG/ML IV SOLN
INTRAVENOUS | Status: DC | PRN
  Administered 2019-11-16: 8 mg via INTRAVENOUS

## 2019-11-16 MED ORDER — CLONIDINE HCL (ANALGESIA) 100 MCG/ML EP SOLN
EPIDURAL | Status: AC
  Filled 2019-11-16: qty 10

## 2019-11-16 MED ORDER — SODIUM CHLORIDE 0.9 % IR SOLN
Status: DC | PRN
  Administered 2019-11-16: 3000 mL

## 2019-11-16 MED ORDER — TRANEXAMIC ACID 1000 MG/10ML IV SOLN
2000.0000 mg | Freq: Once | INTRAVENOUS | Status: DC
  Filled 2019-11-16: qty 20

## 2019-11-16 MED ORDER — HYDROMORPHONE HCL 1 MG/ML IJ SOLN: 0.5000 mg | INTRAMUSCULAR | Status: DC | PRN

## 2019-11-16 SURGICAL SUPPLY — 90 items
APL SKNCLS STERI-STRIP NONHPOA (GAUZE/BANDAGES/DRESSINGS) ×1
BAG DECANTER FOR FLEXI CONT (MISCELLANEOUS) ×3 IMPLANT
BANDAGE ESMARK 6X9 LF (GAUZE/BANDAGES/DRESSINGS) ×1 IMPLANT
BASEPLATE TIBIAL PRIMARY SZ6 (Orthopedic Implant) ×2 IMPLANT
BENZOIN TINCTURE PRP APPL 2/3 (GAUZE/BANDAGES/DRESSINGS) ×2 IMPLANT
BLADE SAG 18X100X1.27 (BLADE) ×3 IMPLANT
BNDG CMPR 9X6 STRL LF SNTH (GAUZE/BANDAGES/DRESSINGS) ×1
BNDG CMPR MED 10X6 ELC LF (GAUZE/BANDAGES/DRESSINGS) ×1
BNDG CMPR MED 15X6 ELC VLCR LF (GAUZE/BANDAGES/DRESSINGS)
BNDG COHESIVE 6X5 TAN STRL LF (GAUZE/BANDAGES/DRESSINGS) ×3 IMPLANT
BNDG ELASTIC 6X10 VLCR STRL LF (GAUZE/BANDAGES/DRESSINGS) ×2 IMPLANT
BNDG ELASTIC 6X15 VLCR STRL LF (GAUZE/BANDAGES/DRESSINGS) ×1 IMPLANT
BNDG ESMARK 6X9 LF (GAUZE/BANDAGES/DRESSINGS) ×3
BOWL SMART MIX CTS (DISPOSABLE) IMPLANT
BSPLAT TIB 6 CMNT PRM STRL KN (Orthopedic Implant) ×1 IMPLANT
CEMENT BONE SIMPLEX SPEEDSET (Cement) ×4 IMPLANT
CLOSURE WOUND 1/2 X4 (GAUZE/BANDAGES/DRESSINGS) ×2
CNTNR URN SCR LID CUP LEK RST (MISCELLANEOUS) ×1 IMPLANT
CONT SPEC 4OZ STRL OR WHT (MISCELLANEOUS) ×3
COVER SURGICAL LIGHT HANDLE (MISCELLANEOUS) ×3 IMPLANT
COVER WAND RF STERILE (DRAPES) ×1 IMPLANT
CUFF TOURN SGL QUICK 34 (TOURNIQUET CUFF) ×3
CUFF TOURN SGL QUICK 42 (TOURNIQUET CUFF) IMPLANT
CUFF TRNQT CYL 34X4.125X (TOURNIQUET CUFF) ×1 IMPLANT
DECANTER SPIKE VIAL GLASS SM (MISCELLANEOUS) ×3 IMPLANT
DRAPE INCISE IOBAN 66X45 STRL (DRAPES) IMPLANT
DRAPE ORTHO SPLIT 77X108 STRL (DRAPES) ×9
DRAPE SURG ORHT 6 SPLT 77X108 (DRAPES) ×3 IMPLANT
DRAPE U-SHAPE 47X51 STRL (DRAPES) ×3 IMPLANT
DRSG AQUACEL AG ADV 3.5X10 (GAUZE/BANDAGES/DRESSINGS) ×2 IMPLANT
DRSG AQUACEL AG ADV 3.5X14 (GAUZE/BANDAGES/DRESSINGS) IMPLANT
DURAPREP 26ML APPLICATOR (WOUND CARE) ×6 IMPLANT
ELECT CAUTERY BLADE 6.4 (BLADE) ×3 IMPLANT
ELECT REM PT RETURN 9FT ADLT (ELECTROSURGICAL) ×3
ELECTRODE REM PT RTRN 9FT ADLT (ELECTROSURGICAL) ×1 IMPLANT
GAUZE SPONGE 4X4 12PLY STRL (GAUZE/BANDAGES/DRESSINGS) ×1 IMPLANT
GLOVE BIOGEL PI IND STRL 7.0 (GLOVE) ×1 IMPLANT
GLOVE BIOGEL PI IND STRL 8 (GLOVE) ×1 IMPLANT
GLOVE BIOGEL PI INDICATOR 7.0 (GLOVE) ×2
GLOVE BIOGEL PI INDICATOR 8 (GLOVE) ×2
GLOVE ECLIPSE 7.0 STRL STRAW (GLOVE) ×1 IMPLANT
GLOVE ECLIPSE 8.0 STRL XLNG CF (GLOVE) ×1 IMPLANT
GLOVE SURG SS PI 7.0 STRL IVOR (GLOVE) ×8 IMPLANT
GLOVE SURG SS PI 8.0 STRL IVOR (GLOVE) ×8 IMPLANT
GOWN STRL REUS W/ TWL LRG LVL3 (GOWN DISPOSABLE) ×3 IMPLANT
GOWN STRL REUS W/TWL LRG LVL3 (GOWN DISPOSABLE)
HANDPIECE INTERPULSE COAX TIP (DISPOSABLE) ×3
HOOD PEEL AWAY FLYTE STAYCOOL (MISCELLANEOUS) ×9 IMPLANT
IMMOBILIZER KNEE 20 (SOFTGOODS)
IMMOBILIZER KNEE 20 THIGH 36 (SOFTGOODS) IMPLANT
IMMOBILIZER KNEE 22 UNIV (SOFTGOODS) IMPLANT
IMMOBILIZER KNEE 24 THIGH 36 (MISCELLANEOUS) IMPLANT
IMMOBILIZER KNEE 24 UNIV (MISCELLANEOUS)
INSERT TRIATH CS SZ6 11 (Insert) ×2 IMPLANT
KIT BASIN OR (CUSTOM PROCEDURE TRAY) ×3 IMPLANT
KIT TURNOVER KIT B (KITS) ×3 IMPLANT
KNEE FEM COMP 5 RT (Joint) ×2 IMPLANT
MANIFOLD NEPTUNE II (INSTRUMENTS) ×3 IMPLANT
NDL 18GX1X1/2 (RX/OR ONLY) (NEEDLE) IMPLANT
NDL SPNL 18GX3.5 QUINCKE PK (NEEDLE) ×1 IMPLANT
NEEDLE 18GX1X1/2 (RX/OR ONLY) (NEEDLE) ×3 IMPLANT
NEEDLE 22X1 1/2 (OR ONLY) (NEEDLE) ×6 IMPLANT
NEEDLE SPNL 18GX3.5 QUINCKE PK (NEEDLE) ×3 IMPLANT
NS IRRIG 1000ML POUR BTL (IV SOLUTION) ×6 IMPLANT
PACK TOTAL JOINT (CUSTOM PROCEDURE TRAY) ×3 IMPLANT
PAD ABD 8X10 STRL (GAUZE/BANDAGES/DRESSINGS) ×2 IMPLANT
PAD ARMBOARD 7.5X6 YLW CONV (MISCELLANEOUS) ×6 IMPLANT
PAD CAST 4YDX4 CTTN HI CHSV (CAST SUPPLIES) ×1 IMPLANT
PADDING CAST COTTON 4X4 STRL (CAST SUPPLIES) ×3
PADDING CAST COTTON 6X4 STRL (CAST SUPPLIES) ×3 IMPLANT
PATELLA 32MMX10MM (Knees) ×2 IMPLANT
PIN FLUTED HEDLESS FIX 3.5X1/8 (PIN) ×2 IMPLANT
SET HNDPC FAN SPRY TIP SCT (DISPOSABLE) ×1 IMPLANT
SPONGE LAP 18X18 X RAY DECT (DISPOSABLE) ×4 IMPLANT
STRIP CLOSURE SKIN 1/2X4 (GAUZE/BANDAGES/DRESSINGS) ×4 IMPLANT
SUCTION FRAZIER HANDLE 10FR (MISCELLANEOUS) ×3
SUCTION TUBE FRAZIER 10FR DISP (MISCELLANEOUS) ×1 IMPLANT
SUT MNCRL AB 3-0 PS2 18 (SUTURE) ×3 IMPLANT
SUT VIC AB 0 CT1 27 (SUTURE) ×15
SUT VIC AB 0 CT1 27XBRD ANBCTR (SUTURE) ×3 IMPLANT
SUT VIC AB 1 CT1 27 (SUTURE) ×21
SUT VIC AB 1 CT1 27XBRD ANBCTR (SUTURE) ×5 IMPLANT
SUT VIC AB 2-0 CT1 27 (SUTURE) ×12
SUT VIC AB 2-0 CT1 TAPERPNT 27 (SUTURE) ×4 IMPLANT
SYR 30ML LL (SYRINGE) ×9 IMPLANT
SYR TB 1ML LUER SLIP (SYRINGE) ×3 IMPLANT
TOWEL GREEN STERILE (TOWEL DISPOSABLE) ×6 IMPLANT
TOWEL GREEN STERILE FF (TOWEL DISPOSABLE) ×6 IMPLANT
TRAY CATH 16FR W/PLASTIC CATH (SET/KITS/TRAYS/PACK) IMPLANT
WATER STERILE IRR 1000ML POUR (IV SOLUTION) IMPLANT

## 2019-11-16 NOTE — Anesthesia Postprocedure Evaluation (Signed)
Anesthesia Post Note  Patient: [name]  Procedure(s) Performed: RIGHT TOTAL KNEE ARTHROPLASTY-CEMENTED (Right Knee)     Patient location during evaluation: PACU Anesthesia Type: Spinal Level of consciousness: awake and alert, patient cooperative and oriented Pain management: pain level controlled Vital Signs Assessment: post-procedure vital signs reviewed and stable Respiratory status: spontaneous breathing, nonlabored ventilation, respiratory function stable and patient connected to nasal cannula oxygen Cardiovascular status: blood pressure returned to baseline and bradycardic (appreciated cardiology consult) Postop Assessment: spinal receding and no apparent nausea or vomiting Anesthetic complications: no Comments: Patient with extreme brady in PACU, BP stable and patient asymptomatic throughout. Spinal had nearly resolved, with patient moving feet, soreness in knee. Responded to atropine and ephedrine IV, Zoll attached prophylactically.  See cardiology notes: pt has history of brady, hence no longer on B-blockade for rate control of her afib. Appreciate cardiology following throughout this admission    Last Vitals:  Vitals:   11/16/19 1305 11/16/19 1340  BP: (!) 157/65 (!) 149/66  Pulse: (!) 59 (!) 52  Resp: 14 17  Temp:  (!) 36.4 C  SpO2: 98% 96%    Last Pain:  Vitals:   11/16/19 1340  TempSrc: Oral  PainSc:                  JACKSON,E. CARSWELL

## 2019-11-16 NOTE — Anesthesia Procedure Notes (Signed)
Procedure Name: MAC Date/Time: 11/16/2019 7:50 AM Performed by: Griffin Dakin, CRNA Pre-anesthesia Checklist: Patient identified, Emergency Drugs available, Suction available and Patient being monitored Patient Re-evaluated:Patient Re-evaluated prior to induction Oxygen Delivery Method: Simple face mask Induction Type: IV induction Ventilation: Oral airway inserted - appropriate to patient size Number of attempts: 1 Airway Equipment and Method: Oral airway Placement Confirmation: positive ETCO2 and breath sounds checked- equal and bilateral Dental Injury: Teeth and Oropharynx as per pre-operative assessment

## 2019-11-16 NOTE — Transfer of Care (Signed)
Immediate Anesthesia Transfer of Care Note  Patient: SOFIAH LYNE  Procedure(s) Performed: RIGHT TOTAL KNEE ARTHROPLASTY-CEMENTED (Right Knee)  Patient Location: PACU  Anesthesia Type:MAC and Spinal  Level of Consciousness: awake, alert  and oriented  Airway & Oxygen Therapy: Patient Spontanous Breathing  Post-op Assessment: Report given to RN and Post -op Vital signs reviewed and stable  Post vital signs: Reviewed and stable  Last Vitals:  Vitals Value Taken Time  BP 143/78 11/16/19 1056  Temp    Pulse 46 11/16/19 1057  Resp 15 11/16/19 1057  SpO2 99 % 11/16/19 1057  Vitals shown include unvalidated device data.  Last Pain:  Vitals:   11/16/19 0621  TempSrc:   PainSc: 0-No pain         Complications: No apparent anesthesia complications

## 2019-11-16 NOTE — Plan of Care (Addendum)
Gattex delivered to main pharmacy   Problem: Education: Goal: Knowledge of General Education information will improve Description: Including pain rating scale, medication(s)/side effects and non-pharmacologic comfort measures Outcome: Progressing   Problem: Activity: Goal: Risk for activity intolerance will decrease Outcome: Progressing   Problem: Pain Managment: Goal: General experience of comfort will improve Outcome: Progressing   Problem: Safety: Goal: Ability to remain free from injury will improve Outcome: Progressing   Problem: Skin Integrity: Goal: Risk for impaired skin integrity will decrease Outcome: Progressing

## 2019-11-16 NOTE — Anesthesia Procedure Notes (Signed)
Spinal  Patient location during procedure: OR End time: 11/16/2019 7:58 AM Staffing Performed: anesthesiologist  Anesthesiologist: Annye Asa, MD Preanesthetic Checklist Completed: patient identified, IV checked, site marked, risks and benefits discussed, surgical consent, monitors and equipment checked, pre-op evaluation and timeout performed Spinal Block Patient position: sitting Prep: DuraPrep and site prepped and draped Patient monitoring: blood pressure, continuous pulse ox, cardiac monitor and heart rate Approach: midline Location: L3-4 Injection technique: single-shot Needle Needle type: Pencan  Needle gauge: 24 G Needle length: 9 cm Additional Notes Pt identified in Operating room.  Monitors applied. Working IV access confirmed. Sterile prep, drape lumbar spine.  1% lido local L 3,4.  #24ga Pencan into clear CSF L 3,4.  59m 0.75% Bupivacaine with dextrose injected with asp CSF beginning and end of injection.  Patient asymptomatic, VSS, no heme aspirated, tolerated well.  CJenita Seashore MD

## 2019-11-16 NOTE — Anesthesia Procedure Notes (Signed)
Anesthesia Regional Block: Adductor canal block   Pre-Anesthetic Checklist: ,, timeout performed, Correct Patient, Correct Site, Correct Laterality, Correct Procedure, Correct Position, site marked, Risks and benefits discussed,  Surgical consent,  Pre-op evaluation,  At surgeon's request and post-op pain management  Laterality: Right and Lower  Prep: chloraprep       Needles:  Injection technique: Single-shot  Needle Type: Echogenic Needle     Needle Length: 9cm  Needle Gauge: 21     Additional Needles:   Procedures:,,,, ultrasound used (permanent image in chart),,,,  Narrative:  Start time: 11/16/2019 7:15 AM End time: 11/16/2019 7:22 AM Injection made incrementally with aspirations every 5 mL.  Performed by: Personally  Anesthesiologist: Annye Asa, MD  Additional Notes: Pt identified in Holding room.  Monitors applied. Working IV access confirmed. Sterile prep R thigh.  #21ga ECHOgenic needle into adductor canal with US guidance.  20cc 0.75% Ropivacaine injected incrementally after negative test dose.  Patient asymptomatic, VSS, no heme aspirated, tolerated well.  Jenita Seashore, MD

## 2019-11-16 NOTE — Brief Op Note (Signed)
   11/16/2019  10:50 AM  PATIENT:  [name]  64 y.o. female  PRE-OPERATIVE DIAGNOSIS:  right knee osteoarthritis  POST-OPERATIVE DIAGNOSIS:  right knee osteoarthritis  PROCEDURE:  Procedure(s): RIGHT TOTAL KNEE ARTHROPLASTY-CEMENTED  SURGEON:  Surgeon(s): Marlou Sa, Tonna Corner, MD  ASSISTANT: magnant pa  ANESTHESIA:   spinal  EBL: 100 ml    Total I/O In: 1000 [I.V.:1000] Out: 100 [Blood:100]  BLOOD ADMINISTERED: none  DRAINS: none   LOCAL MEDICATIONS USED:  Marcaine mso4 clonidine exparel vanco  SPECIMEN:  No Specimen  COUNTS:  YES  TOURNIQUET:   Total Tourniquet Time Documented: Thigh (Right) - 86 minutes Total: Thigh (Right) - 86 minutes   DICTATION: .Other Dictation: Dictation Number [id number]  PLAN OF CARE: Admit to inpatient   PATIENT DISPOSITION:  PACU - hemodynamically stable

## 2019-11-16 NOTE — Progress Notes (Addendum)
CRITICAL VALUE ALERT  Critical Value:  CBG 503  Date & Time Notied:  11/16/19 @ 2039  Provider Notified: Ninfa Linden  Orders Received/Actions taken: Give 15 units of Novolog and change diet to Carb Mod

## 2019-11-16 NOTE — Evaluation (Signed)
Physical Therapy Evaluation Patient Details Name: [name] MRN: [id number] DOB: April 01, 1956 Today's Date: 11/16/2019   History of Present Illness  Pt is a 64 y/o female s/p R TKA. Pt also with episode of bradycardia during surgery; cardiology on board.  PMH includes HTN, DM, a fib, and CAD.   Clinical Impression  Pt is s/p surgery above with deficits below. Pt requiring min to min guard A for mobility this session. Limited to bathroom and back secondary to pain. Pt HR in mid 80s throughout. Educated about knee precautions. Will continue to follow acutely to maximize functional mobility independence and safety.     Follow Up Recommendations Follow surgeon's recommendation for DC plan and follow-up therapies;Supervision for mobility/OOB(possible SNF)    Equipment Recommendations  None recommended by PT    Recommendations for Other Services       Precautions / Restrictions Precautions Precautions: Knee Precaution Booklet Issued: No Precaution Comments: Verbally reviewed knee precautions with pt.  Restrictions Weight Bearing Restrictions: Yes RLE Weight Bearing: Weight bearing as tolerated      Mobility  Bed Mobility Overal bed mobility: Needs Assistance Bed Mobility: Sit to Supine       Sit to supine: Min assist   General bed mobility comments: Min A for RLE assist for return to supine at end of session. Was sitting at EOB upon entry.   Transfers Overall transfer level: Needs assistance Equipment used: Rolling walker (2 wheeled) Transfers: Sit to/from Stand Sit to Stand: Min assist         General transfer comment: Min A for steadying assist. LOB noted once standing and required min A for steadying. Cues for safe hand placement.   Ambulation/Gait Ambulation/Gait assistance: Min guard Gait Distance (Feet): 15 Feet Assistive device: Rolling walker (2 wheeled) Gait Pattern/deviations: Step-to pattern;Decreased step length - right;Decreased step length -  left;Antalgic Gait velocity: Decreased   General Gait Details: Slow, antalgic gait. Cues for sequencing using RW. Distance limited to bathroom and back.   Stairs            Wheelchair Mobility    Modified Rankin (Stroke Patients Only)       Balance Overall balance assessment: Needs assistance Sitting-balance support: No upper extremity supported;Feet supported Sitting balance-Leahy Scale: Good     Standing balance support: Bilateral upper extremity supported;During functional activity Standing balance-Leahy Scale: Poor Standing balance comment: Reliant on BUE support                              Pertinent Vitals/Pain Pain Assessment: Faces Faces Pain Scale: Hurts even more Pain Location:  Rknee Pain Descriptors / Indicators: Aching;Operative site guarding Pain Intervention(s): Limited activity within patient's tolerance;Monitored during session;Repositioned    Home Living Family/patient expects to be discharged to:: Other (Comment)(REHAB) Living Arrangements: Spouse/significant other Available Help at Discharge: Family;Available 24 hours/day Type of Home: House Home Access: Ramped entrance     Home Layout: One level Home Equipment: Walker - 2 wheels;Shower seat;Grab bars - tub/shower;Grab bars - toilet      Prior Function Level of Independence: Independent               Hand Dominance        Extremity/Trunk Assessment   Upper Extremity Assessment Upper Extremity Assessment: Overall WFL for tasks assessed    Lower Extremity Assessment Lower Extremity Assessment: RLE deficits/detail RLE Deficits / Details: Deficits consistent with post op pain and weakness.  Cervical / Trunk Assessment Cervical / Trunk Assessment: Normal  Communication   Communication: No difficulties  Cognition Arousal/Alertness: Awake/alert Behavior During Therapy: WFL for tasks assessed/performed Overall Cognitive Status: Within Functional Limits for tasks  assessed                                        General Comments General comments (skin integrity, edema, etc.): Pt's daughter present during session     Exercises     Assessment/Plan    PT Assessment Patient needs continued PT services  PT Problem List Decreased strength;Decreased balance;Decreased mobility;Decreased activity tolerance;Decreased knowledge of use of DME;Decreased knowledge of precautions;Decreased range of motion       PT Treatment Interventions DME instruction;Gait training;Functional mobility training;Therapeutic activities;Therapeutic exercise;Stair training;Balance training;Patient/family education    PT Goals (Current goals can be found in the Care Plan section)  Acute Rehab PT Goals Patient Stated Goal: to go home PT Goal Formulation: With patient Time For Goal Achievement: 11/30/19 Potential to Achieve Goals: Good    Frequency 7X/week   Barriers to discharge        Co-evaluation               AM-PAC PT "6 Clicks" Mobility  Outcome Measure Help needed turning from your back to your side while in a flat bed without using bedrails?: None Help needed moving from lying on your back to sitting on the side of a flat bed without using bedrails?: A Little Help needed moving to and from a bed to a chair (including a wheelchair)?: A Little Help needed standing up from a chair using your arms (e.g., wheelchair or bedside chair)?: A Little Help needed to walk in hospital room?: A Little Help needed climbing 3-5 steps with a railing? : A Lot 6 Click Score: 18    End of Session Equipment Utilized During Treatment: Gait belt Activity Tolerance: Patient tolerated treatment well Patient left: in bed;with call bell/phone within reach;with family/visitor present Nurse Communication: Mobility status;Other (comment)(pt voided) PT Visit Diagnosis: Muscle weakness (generalized) (M62.81);Unsteadiness on feet (R26.81)    Time: 0802-2336 PT Time  Calculation (min) (ACUTE ONLY): 26 min   Charges:   PT Evaluation $PT Eval Low Complexity: 1 Low PT Treatments $Gait Training: 8-22 mins        Lou Miner, DPT  Acute Rehabilitation Services  Pager: [phone] Office: [phone]   Rudean Hitt 11/16/2019, 5:57 PM

## 2019-11-16 NOTE — Progress Notes (Signed)
Pt heart rate started to drop from 50's to 30's. Called Dr Glennon Mac to give update. Obtained EKG. Heart rate continued to drop to the 20's Dr Glennon Mac called again. Anesthesia team came bedside and .6 atropine and 30 ephedrine given.   Heart rate increased to 60-70's. Cardiology consulted.

## 2019-11-16 NOTE — Progress Notes (Signed)
Pt stable - pain ok thx to cardiology for heart management Right foot perfused and sensate Has been oob Plan tele tonight with PT am and possible dc to snf on wed thursday

## 2019-11-16 NOTE — Consult Note (Addendum)
ELECTROPHYSIOLOGY CONSULT NOTE    Patient ID: [name] MRN: [id number], DOB/AGE: August 27, 1955 64 y.o.  Admit date: 11/16/2019 Date of Consult: 11/16/2019  Primary Physician: Daisy Floro, DO Primary Cardiologist: Follows at [facility] in [place] for AF Electrophysiologist: New   Referring Provider: Dr. Glennon Mac ([place])  Patient Profile: [name] is a 64 y.o. female with a history of paroxysmal atrial fibrillation on Eliquis, osteoarthritis s/p R knee arthroplasty this admission, HTN, DM2, OSA, ?CAD, and obesity who is being seen today for the evaluation of significant bradycardia at the request of Dr. Clint Guy.  HPI:  [name] is a 64 y.o. female with medical history of above. Pt underwent R knee arthroplasty this am, and in the recovery phase was noted to have significant bradycardia down to 26 bpm. EKG shows ? AF with very slow RVR. Pt with known h/o AF with eliquis held for surgery.  Previous monitor showed pauses of up to 2.7 seconds in length and PAF up to 8 hours. She was previously on metoprolol that was ultimately held due to bradycardia. She denies syncopal episodes or chest pain.  Currently she is drowsy, but alert when spoken to. No current complaints other than soreness from her knee.   She states a pacemaker has been "mentioned" to her in the past, but had no immediate need.   Echo 01/2018 LVEF 55% She reports a remote heart cath with "40% plaque" in an indeterminate vessel. Cardiolite 03/2018 showed no evidence of active ischemia or prior infarction.  Past Medical History:  Diagnosis Date  . Allergy   . Anemia   . ANEMIA, PERNICIOUS, HX OF 05/13/2007  . Anxiety   . Arthritis   . Arthritis of knee 06/30/2018  . ASYMPTOMATIC POSTMENOPAUSAL STATUS 02/18/2008  . BACK PAIN, LUMBAR 11/19/2007  . Bowel incontinence   . Cataract   . Cellulitis of left lower leg 10/28/2013  . Cellulitis of leg, right 10/11/2010  . Chronic dermatitis of hands 05/02/2009    . DEPRESSION, CHRONIC 10/06/2007  . Diabetes mellitus without complication ([place])   . DIABETES MELLITUS, WITH NEUROLOGICAL COMPLICATIONS 9/83/3825  . Diabetic neuropathy ([place])   . DYSLIPIDEMIA 05/13/2007  . Essential hypertension 03/20/2010  . Family history of colon cancer   . Fecal soiling 06/28/2014  . Frozen shoulder    Lt  . Full dentures   . GERD (gastroesophageal reflux disease)   . GI bleed 03/15/2016  . GOITER, MULTINODULAR 05/13/2007  . Headache   . History of blood transfusion   . Hx of colonic polyps 12/04/2010  . HYPERCHOLESTEROLEMIA 03/20/2010  . HYPERTENSION 03/20/2010  . Lower back pain   . NASH (nonalcoholic steatohepatitis)   . OA (osteoarthritis)   . OBSTRUCTIVE SLEEP APNEA 06/12/2007   uses CPAP.  Marland Kitchen PERIPHERAL NEUROPATHY 01/12/2009  . Peripheral vascular disease ([place])   . Pneumonia   . Primary osteoarthritis of left knee 04/23/2011   Previously seen by Dr. Alphonzo Severance. Will plan on repeat referral after trial injection.     Marland Kitchen PSORIASIS 05/28/2010  . Rectal prolapse 05/24/2015  . Rectovaginal fistula 05/25/2014  . Renal insufficiency    stage 1 kd  . Sleep apnea   . Unilateral primary osteoarthritis, left knee   . UNSPECIFIED VENOUS INSUFFICIENCY 05/28/2010  . UNSPECIFIED VENOUS INSUFFICIENCY 05/28/2010   Patient with frequent ulceration related to venous stasis-seen at wound care. Edema and Venous stasis changes due to this . Echo 04/16/11 with EF 60%. No signs diastolic dysfunction.       Marland Kitchen  Wears glasses      Surgical History:  Past Surgical History:  Procedure Laterality Date  . CATARACT EXTRACTION W/ INTRAOCULAR LENS  IMPLANT, BILATERAL    . COLONOSCOPY    . ELECTROCARDIOGRAM  04/16/2006  . EXAMINATION UNDER ANESTHESIA N/A 06/29/2014   Procedure: EXAM UNDER ANESTHESIA;  Surgeon: Janyth Contes, MD;  Location: [facility];  Service: Gynecology;  Laterality: N/A;  . Exercise myoview  01/24/2005  . FLEXIBLE SIGMOIDOSCOPY    . GASTRIC BYPASS  11/09/2013  .  GASTRIC BYPASS    . JOINT REPLACEMENT    . KNEE ARTHROSCOPY  2003   right  . LAPAROSCOPY N/A 03/12/2016   Procedure: LAPAROSCOPIC ANASTOMOSIS OF INTESTINE (ENTEROENTEROSTOMY);  Surgeon: Ladora Daniel, MD;  Location: [facility];  Service: General;  Laterality: N/A;  . LEG SURGERY Left    metal and pins in lower left leg  . mrsa Right    arm  . MULTIPLE TOOTH EXTRACTIONS    . SHOULDER ARTHROSCOPY W/ ROTATOR CUFF REPAIR Right   . stab phlebectomy  Right 02/10/2019   stab phlebectomy > 20 incisions right leg by [name] MD   . TONSILLECTOMY    . TOTAL KNEE ARTHROPLASTY Left 06/30/2018   Procedure: LEFT TOTAL KNEE ARTHROPLASTY;  Surgeon: Meredith Pel, MD;  Location: [facility];  Service: Orthopedics;  Laterality: Left;  Marland Kitchen VARICOSE VEIN SURGERY     Remotef     Medications Prior to Admission  Medication Sig Dispense Refill Last Dose  . amLODipine (NORVASC) 10 MG tablet Take 1 tablet (10 mg total) by mouth daily. 90 tablet 0 11/16/2019 at 0400  . apixaban (ELIQUIS) 5 MG TABS tablet Take 1 tablet (5 mg total) by mouth 2 (two) times daily. 60 tablet 0 11/12/2019  . atorvastatin (LIPITOR) 40 MG tablet Take 1 tablet (40 mg total) by mouth daily. 90 tablet 0 11/16/2019 at 0400  . gabapentin (NEURONTIN) 100 MG capsule Take 100 mg by mouth every morning.   11/16/2019 at 0400  . lisinopril-hydrochlorothiazide (ZESTORETIC) 20-25 MG tablet Take 1 tablet by mouth daily. 90 tablet 3 11/15/2019 at Unknown time  . metFORMIN (GLUCOPHAGE) 1000 MG tablet Take 1 tablet (1,000 mg total) by mouth daily with breakfast. 90 tablet 2 11/14/2019  . methocarbamol (ROBAXIN) 500 MG tablet Take 1 tablet (500 mg total) by mouth every 6 (six) hours as needed for muscle spasms. 120 tablet 0 Past Month at Unknown time  . multivitamin-iron-minerals-folic acid (CENTRUM) chewable tablet Chew 1 tablet by mouth daily.    11/12/2019  . sertraline (ZOLOFT) 100 MG tablet Take 1 tablet (100 mg total) by mouth daily. 90 tablet 1  11/16/2019 at 0400  . Teduglutide, rDNA, (GATTEX) 5 MG KIT Inject 5 mg into the skin daily.   11/15/2019 at Unknown time  . ferrous sulfate 324 (65 Fe) MG TBEC Take 1 tablet (325 mg total) by mouth every other day. 30 tablet 6   . gabapentin (NEURONTIN) 300 MG capsule Take 300 mg by mouth at bedtime.   11/15/2019  . glucose blood (ACCU-CHEK AVIVA PLUS) test strip Use to check sugar once daily in the morning (Patient not taking: Reported on 11/09/2019) 100 each 0   . NON FORMULARY Diet type:  NAS       Inpatient Medications:  . amLODipine  10 mg Oral Daily  . atorvastatin  40 mg Oral Daily  . bupivacaine liposome  20 mL Infiltration Once  . gabapentin  100 mg Oral BH-q7a  . gabapentin  300 mg Oral QHS  . HYDROmorphone      . insulin aspart  0-15 Units Subcutaneous TID WC  . insulin aspart  4 Units Subcutaneous TID WC  . lisinopril-hydrochlorothiazide  1 tablet Oral Daily  . [START ON 11/17/2019] metFORMIN  1,000 mg Oral Q breakfast  . sertraline  100 mg Oral Daily  . tranexamic acid (CYKLOKAPRON) topical - INTRAOP  2,000 mg Topical Once    Allergies:  Allergies  Allergen Reactions  . Cephalexin Hives and Rash    Denies Airway involvement  . Latex Rash  . Neomycin-Bacitracin Zn-Polymyx Rash  . Tape Rash    Latex Prefers paper tape    Social History   Socioeconomic History  . Marital status: Married    Spouse name: Marya Amsler  . Number of children: 2  . Years of education: [employer] GED  . Highest education level: Not on file  Occupational History  . Occupation: Unemployed    Comment: disabled  Tobacco Use  . Smoking status: Former Smoker    Packs/day: 2.00    Years: 24.00    Pack years: 48.00    Types: Cigarettes    Start date: 06/24/1970    Quit date: 08/06/1994    Years since quitting: 25.2  . Smokeless tobacco: Never Used  Substance and Sexual Activity  . Alcohol use: No    Alcohol/week: 0.0 standard drinks  . Drug use: No  . Sexual activity: Never  Other Topics Concern  .  Not on file  Social History Narrative   Married '91, husband has MS, wheelchair bound   [id number] x 2, 1 son '91 8.5lbs, 1 daughter '92 10lbs      Caffeine use- coffee 3-4 cups daily   Social Determinants of Health   Financial Resource Strain:   . Difficulty of Paying Living Expenses:   Food Insecurity:   . Worried About Charity fundraiser in the Last Year:   . Arboriculturist in the Last Year:   Transportation Needs:   . Film/video editor (Medical):   Marland Kitchen Lack of Transportation (Non-Medical):   Physical Activity:   . Days of Exercise per Week:   . Minutes of Exercise per Session:   Stress:   . Feeling of Stress :   Social Connections:   . Frequency of Communication with Friends and Family:   . Frequency of Social Gatherings with Friends and Family:   . Attends Religious Services:   . Active Member of Clubs or Organizations:   . Attends Archivist Meetings:   Marland Kitchen Marital Status:   Intimate Partner Violence:   . Fear of Current or Ex-Partner:   . Emotionally Abused:   Marland Kitchen Physically Abused:   . Sexually Abused:      Family History  Problem Relation Age of Onset  . Hyperlipidemia Father   . Hypertension Father   . Cirrhosis Father   . Colon cancer Father 67       d. 64  . Colon cancer Sister 23       d. 46  . Bipolar disorder Sister   . Aneurysm Mother        brain  . Cerebral aneurysm Mother   . Colon cancer Sister 96       d. 52  . Bipolar disorder Sister   . Cancer Paternal Aunt        NOS, ? colon  . Congestive Heart Failure Maternal Grandmother   . Drug abuse Neg Hx   .  CAD Neg Hx   . Stomach cancer Neg Hx      Review of Systems: All other systems reviewed and are otherwise negative except as noted above.  Physical Exam: Vitals:   11/16/19 1205 11/16/19 1215 11/16/19 1235 11/16/19 1250  BP: (!) 163/81 (!) 162/63 (!) 163/66 (!) 166/65  Pulse: 68 (!) 58 61 (!) 52  Resp: _0 Temp:      TempSrc:      SpO2: 99% 99% 99% 98%    Weight:      Height:        GEN- The patient is well appearing, alert and oriented x 3 today.   HEENT: normocephalic, atraumatic; sclera clear, conjunctiva pink; hearing intact; oropharynx clear; neck supple Lungs- Clear to ausculation bilaterally, normal work of breathing.  No wheezes, rales, rhonchi Heart- Regular rate and rhythm, no murmurs, rubs or gallops GI- soft, non-tender, non-distended, bowel sounds present Extremities- no clubbing, cyanosis, or edema; DP/PT/radial pulses 2+ bilaterally MS- no significant deformity or atrophy Skin- warm and dry, no rash or lesion Psych- euthymic mood, full affect Neuro- strength and sensation are intact  Labs:   Lab Results  Component Value Date   WBC 5.7 11/12/2019   HGB 12.7 11/12/2019   HCT 42.9 11/12/2019   MCV 82.0 11/12/2019   PLT 294 11/12/2019    Recent Labs  Lab 11/12/19 1008  NA 140  K 4.1  CL 106  CO2 25  BUN 18  CREATININE 0.95  CALCIUM 10.8*  GLUCOSE 130*      Radiology/Studies: No results found.  EKG: Baseline EKG from 09/29/2019 shows AF with controlled ventricular rate at 65 bpm (personally reviewed)  Post op EKGs show significant bradycardia (? AF with very slow conduction) at as low as 27 bpm.  TELEMETRY: Rates have improved back to 50-60 bpm. Difficult to tell at times if NSR vs AF with controlled rate (personally reviewed)  Studies Reviewed Monitor 03/2018 ([place]) 1. The predominant rhythm was sinus. The patient's heart rate during the monitoring period ranged from 22-141 beats per minute. The average heart rate was around 60 beats per minute. 2. No significant ventricular ectopy was noted. 3. Patient was noted to have one episode of atrial fibrillation lasting 8 hours and 39 minutes with a maximum heart rate of 126 beats per minute, minimum heart rate of 37 beats per minute, and average heart rate of 68 beats per minute. No other supraventricular ectopy was noted. 4. First-degree AV block  was noted. There was one sinus pause up to 2.7 seconds in length. 5. No symptoms were documented.  Lexiscan Cardiolitestress test 04/08/2018: ([place]) 1. LV EF was 52% 2. Global left ventricular systolic function was , with an EF of 52%.Negative study for ischemia or infarct.   Assessment/Plan: 1.  Significant post op bradycardia In the setting of baseline conduction disease and known AF post sedation She has returned to rates of 60s currently.  Will follow HRs overnight for indication for PPM. For now, would just follow as sedation continues to wear off. She has previously had bradycardia in the setting of surgery prompting monitoring as above.   2. Atrial fibrillation Currently appears to be in AF with controlled rate in 60s. Will monitor on tele.  Eliquis on hold post op. Will eventually need to go back on with CHA2DS2VASC of at least 5. Once disposition decided and clear from surgical standpoint.   3. ?CAD Pt reports distant history of cath that  showed 40% blockage in unclear artery.  Cardiolite 03/2018 as above with no ischemia or evidence of prior infarct. Pt denies chest pain.  4. OA s/p R Knee Arthroplasty Now stable post op.   EP will continue to follow along. Pt lives in [place] and would like to establish with our group for [facility].   For questions or updates, please contact [place] Please consult [email or web address] for contact info under Cardiology/STEMI.  Signed, Shirley Friar, PA-C  11/16/2019 12:58 PM   I have seen, examined the patient, and reviewed the above assessment and plan.  Changes to above are made where necessary.  On exam, iRRR.  The patient has persistent afib.  She now has advanced AV block with bradycardia post operatively.  She reports having similar events in the setting of prior surgeries but typically does not have symptomatic bradycardia.   Her bradycardia has improved over the course of the day.  She would like to avoid  pacing. We will therefore observe on telemetry overnight.  If her ventricular rates remain stable, she could possibly be discharged tomorrow with close outpatient EP follow-up. If she has symptomatic bradycardia overnight, we will reconsider.  Co Sign: Thompson Grayer, MD 11/16/2019 10:43 PM

## 2019-11-16 NOTE — Interval H&P Note (Signed)
History and Physical Interval Note:  11/16/2019 7:38 AM  [name] L Chain  has presented today for surgery, with the diagnosis of right knee osteoarthritis.  The various methods of treatment have been discussed with the patient and family. After consideration of risks, benefits and other options for treatment, the patient has consented to  Procedure(s): RIGHT TOTAL KNEE ARTHROPLASTY-CEMENTED (Right) as a surgical intervention.  The patient's history has been reviewed, patient examined, no change in status, stable for surgery.  I have reviewed the patient's chart and labs.  Questions were answered to the patient's satisfaction.     Anderson Malta

## 2019-11-16 NOTE — Progress Notes (Signed)
Orthopedic Tech Progress Note Patient Details:  [name] 08-Oct-1955 [id number]   I applied CPM to patient on 5n CPM Right Knee CPM Right Knee: On Right Knee Flexion (Degrees): 10 Right Knee Extension (Degrees): 40 Additional Comments: added fresh ice  Post Interventions Patient Tolerated: [place], Well Instructions Provided: Care of device Ortho Devices Type of Ortho Device: Bone foam zero knee Ortho Device/Splint Interventions: Ordered   Post Interventions Patient Tolerated: Fair, Well Instructions Provided: Care of device   Janit Pagan 11/16/2019, 2:54 PM

## 2019-11-16 NOTE — Plan of Care (Signed)
  Problem: Education: Goal: Knowledge of General Education information will improve Description Including pain rating scale, medication(s)/side effects and non-pharmacologic comfort measures Outcome: Progressing   

## 2019-11-17 ENCOUNTER — Encounter: Payer: Self-pay | Admitting: *Deleted

## 2019-11-17 ENCOUNTER — Encounter: Payer: Self-pay | Admitting: Family Medicine

## 2019-11-17 LAB — CBC
HCT: 33.9 % — ABNORMAL LOW (ref 36.0–46.0)
Hemoglobin: 10 g/dL — ABNORMAL LOW (ref 12.0–15.0)
MCH: 24.3 pg — ABNORMAL LOW (ref 26.0–34.0)
MCHC: 29.5 g/dL — ABNORMAL LOW (ref 30.0–36.0)
MCV: 82.5 fL (ref 80.0–100.0)
Platelets: 180 10*3/uL (ref 150–400)
RBC: 4.11 MIL/uL (ref 3.87–5.11)
RDW: 13.9 % (ref 11.5–15.5)
WBC: 8.3 10*3/uL (ref 4.0–10.5)
nRBC: 0 % (ref 0.0–0.2)

## 2019-11-17 LAB — GLUCOSE, CAPILLARY
Glucose-Capillary: 113 mg/dL — ABNORMAL HIGH (ref 70–99)
Glucose-Capillary: 124 mg/dL — ABNORMAL HIGH (ref 70–99)

## 2019-11-17 NOTE — Discharge Instructions (Signed)

## 2019-11-17 NOTE — TOC Initial Note (Addendum)
Transition of Care [facility]) - Initial/Assessment Note    Patient Details  Name: [name] MRN: [id number] Date of Birth: 01/05/1956  Transition of Care [facility]) CM/SW Contact:    Sharin Mons, RN Phone Number: [phone] 11/17/2019, 2:50 PM  Clinical Narrative:              Pt s/p R TKA, 5/26.  From home with disabled husband.  NCM received consult for possible SNF placement if needed at time of discharge. NCM spoke with patient regarding PT recommendation of SNF placement at time of discharge. Patient reported she  is currently unable to care for self at her home given her current physical needs and fall risk. Patient expressed understanding of PT recommendation and is agreeable to SNF placement at time of discharge. Patient without  preference . NCM discussed insurance authorization process and provided Medicare SNF ratings list. Patient expressed being hopeful for rehab and to feel better soon. No further questions reported at this time. NCM to continue to follow and assist with discharge planning needs.   Expected Discharge Plan: Skilled Nursing Facility Barriers to Discharge: Continued Medical Work up   Patient Goals and CMS Choice Patient states their goals for this hospitalization and ongoing recovery are:: to get better CMS Medicare.gov Compare Post Acute Care list provided to:: Patient Choice offered to / list presented to : Patient  Expected Discharge Plan and Services Expected Discharge Plan: [place]   Discharge Planning Services: CM Consult   Living arrangements for the past 2 months: Single Family Home                                      Prior Living Arrangements/Services Living arrangements for the past 2 months: Single Family Home Lives with:: Spouse Patient language and need for interpreter reviewed:: Yes Do you feel safe going back to the place where you live?: Yes      Need for Family Participation in Patient Care: Yes  (Comment) Care giver support system in place?: Yes (comment)   Criminal Activity/Legal Involvement Pertinent to Current Situation/Hospitalization: No - Comment as needed  Activities of Daily Living Home Assistive Devices/Equipment: Eyeglasses ADL Screening (condition at time of admission) Patient's cognitive ability adequate to safely complete daily activities?: Yes Is the patient deaf or have difficulty hearing?: No Does the patient have difficulty seeing, even when wearing glasses/contacts?: No Does the patient have difficulty concentrating, remembering, or making decisions?: No Patient able to express need for assistance with ADLs?: Yes Does the patient have difficulty dressing or bathing?: No Independently performs ADLs?: Yes (appropriate for developmental age) Does the patient have difficulty walking or climbing stairs?: Yes Weakness of Legs: Right Weakness of Arms/Hands: None  Permission Sought/Granted   Permission granted to share information with : Yes, Verbal Permission Granted  Share Information with NAME: Selah Klang [phone] Nissi Doffing [phone]           Emotional Assessment Appearance:: Appears stated age   Affect (typically observed): Pleasant Orientation: : Oriented to Self, Oriented to Place, Oriented to  Time, Oriented to Situation Alcohol / Substance Use: Not Applicable Psych Involvement: No (comment)  Admission diagnosis:  S/P total knee arthroplasty, right [Z96.651] Patient Active Problem List   Diagnosis Date Noted  . S/P total knee arthroplasty, right 11/16/2019  . [place] 10/29/2019  . Surgical counseling visit 10/02/2019  . Blood in urine 07/19/2019  . Hypothyroidism  07/19/2019  . Nausea 07/19/2019  . Sinus bradycardia 07/19/2019  . Vaginal irritation 07/19/2019  . Screening mammogram, encounter for 07/12/2019  . Hypertension associated with diabetes ([place]) 07/11/2018  . Dyslipidemia associated with type 2 diabetes mellitus ([place]) 07/11/2018   . Short bowel syndrome 07/11/2018  . Peripheral autonomic neuropathy due to diabetes mellitus ([place]) 07/11/2018  . Status post left knee replacement 07/11/2018  . Major depression in remission ([place]) 07/07/2018  . Primary osteoarthritis involving multiple joints 07/07/2018  . Coronary artery disease involving native coronary artery of native heart without angina pectoris 07/07/2018  . Paroxysmal atrial fibrillation ([place]) 05/11/2018  . Mild cognitive impairment 04/30/2018  . Generalized abdominal pain 02/05/2018  . Spigelian hernia 02/05/2018  . Venous stasis dermatitis of right lower extremity 01/02/2018  . Genetic testing 11/24/2017  . Family history of colon cancer   . Dyslipidemia 04/15/2017  . Gastroesophageal reflux disease without esophagitis 04/15/2017  . Umbilical hernia without obstruction and without gangrene 04/15/2017  . B12 deficiency 12/13/2016  . Malabsorption 03/12/2016  . Tubular adenoma of colon 03/09/2016  . Vitamin D deficiency 03/09/2016  . Fecal incontinence 08/02/2015  . Stress 05/24/2015  . S/P bariatric surgery-duodenal switch with sleeve gastrectomy 12/04/2013  . Nonobstructive CAD  08/26/2013  . Iron deficiency anemia 06/19/2012  . Low back pain 03/05/2012  . PSORIASIS 05/28/2010  . HYPERCHOLESTEROLEMIA 03/20/2010  . Peripheral neuropathy 01/12/2009  . Type 2 diabetes, controlled, with neuropathy ([place]) 02/18/2008  . Chronic depression 10/06/2007  . OBSTRUCTIVE SLEEP APNEA 06/12/2007  . GOITER, MULTINODULAR 05/13/2007   PCP:  Daisy Floro, DO Pharmacy:   [id number] [place] [address]Phone: [phone]: [phone] [facility] [address]Phone: [phone]: [phone] [facility] [address] [place] [address] [facility] [address] [address]Phone: [phone]: [phone]    Social Determinants of Health (SDOH) Interventions    Readmission Risk Interventions No flowsheet data found.

## 2019-11-17 NOTE — Progress Notes (Addendum)
PT Cancellation Note  Patient Details Name: [name] MRN: [id number] DOB: 1955/09/30   Cancelled Treatment:    Reason Eval/Treat Not Completed: Patient declined, no reason specified(Pt speaking with CM at this moment and asked PTA to return at a later time today.  Will f/u this pm.)   Avedis Bevis Eli Hose 11/17/2019, 4:12 PM  [name] , PTA Acute Rehabilitation Services Pager [phone] Office [phone]

## 2019-11-17 NOTE — Progress Notes (Signed)
  Subjective: Patient stable.  Pain controlled.  Has walked outside the hall full out today.   Objective: Vital signs in last 24 hours: Temp:  [97.4 F (36.3 C)-98.4 F (36.9 C)] 98.2 F (36.8 C) (05/26 1327) Pulse Rate:  [60-80] 80 (05/26 1327) Resp:  [14-16] 16 (05/26 1327) BP: (118-157)/(57-78) 118/57 (05/26 1327) SpO2:  [94 %-96 %] 96 % (05/26 1327)  Intake/Output from previous day: 05/25 0701 - 05/26 0700 In: 1000 [I.V.:1000] Out: 400 [Urine:300; Blood:100] Intake/Output this shift: No intake/output data recorded.  Exam:  Dorsiflexion/Plantar flexion intact  Labs: Recent Labs    11/17/19 0337  HGB 10.0*   Recent Labs    11/17/19 0337  WBC 8.3  RBC 4.11  HCT 33.9*  PLT 180   No results for input(s): NA, K, CL, CO2, BUN, CREATININE, GLUCOSE, CALCIUM in the last 72 hours. Recent Labs    11/16/19 0600  INR 1.0    Assessment/Plan: Plan at this time is social work consult for skilled nursing home placement.  If she is doing well in physical therapy she may end up being discharged on Friday.  She still is having some bradycardia episodes but cardiology follow-up as an outpatient has been recommended.   Landry Dyke Karen Huhta 11/17/2019, 7:01 PM

## 2019-11-17 NOTE — Progress Notes (Signed)
Physical Therapy Treatment Patient Details Name: [name] MRN: [id number] DOB: Apr 04, 1956 Today's Date: 11/17/2019    History of Present Illness Pt is a 64 y/o female s/p R TKA. Pt also with episode of bradycardia during surgery; cardiology on board.  PMH includes HTN, DM, a fib, and CAD.     PT Comments    Pt supine in bed on arrival this session.  She reports she is not sure if she can get care for her husband and she is quite worried about this.  Pt continues to make functional gains but will need to be independent to return home into her caregiver role.  Will f/u in am.      Follow Up Recommendations  Follow surgeon's recommendation for DC plan and follow-up therapies;Supervision for mobility/OOB     Equipment Recommendations  None recommended by PT    Recommendations for Other Services       Precautions / Restrictions Precautions Precautions: Knee Precaution Booklet Issued: No Precaution Comments: Verbally reviewed knee precautions with pt.  Restrictions Weight Bearing Restrictions: Yes RLE Weight Bearing: Weight bearing as tolerated    Mobility  Bed Mobility Overal bed mobility: Needs Assistance Bed Mobility: Supine to Sit     Supine to sit: Supervision     General bed mobility comments: No assistance needed.  Transfers Overall transfer level: Needs assistance Equipment used: Rolling walker (2 wheeled) Transfers: Sit to/from Stand Sit to Stand: Supervision         General transfer comment: Cues for hand placement and safety with RW. tendency to leave RW to the side when backing to seated surface.  Ambulation/Gait Ambulation/Gait assistance: Min guard Gait Distance (Feet): 200 Feet Assistive device: Rolling walker (2 wheeled) Gait Pattern/deviations: Decreased step length - right;Decreased step length - left;Antalgic;Step-through pattern;Trunk flexed Gait velocity: Decreased   General Gait Details: Cues for hipext and scap retraction to improve  posture. Cues for gt symmetry.   Stairs             Wheelchair Mobility    Modified Rankin (Stroke Patients Only)       Balance Overall balance assessment: Needs assistance Sitting-balance support: No upper extremity supported;Feet supported Sitting balance-Leahy Scale: Good       Standing balance-Leahy Scale: Fair                              Cognition Arousal/Alertness: Awake/alert Behavior During Therapy: WFL for tasks assessed/performed Overall Cognitive Status: Within Functional Limits for tasks assessed                                        Exercises Total Joint Exercises Ankle Circles/Pumps: AROM;Both;20 reps;Supine Quad Sets: AROM;Right;10 reps;Supine Heel Slides: AAROM;Right;10 reps;Supine Hip ABduction/ADduction: AAROM;Right;10 reps;Supine;AROM Straight Leg Raises: AAROM;Right;10 reps;Supine;AROM    General Comments        Pertinent Vitals/Pain Faces Pain Scale: Hurts even more Pain Location:  Rknee Pain Descriptors / Indicators: Aching;Operative site guarding Pain Intervention(s): Monitored during session;Repositioned    Home Living                      Prior Function            PT Goals (current goals can now be found in the care plan section) Acute Rehab PT Goals Patient Stated Goal: to go home Potential  to Achieve Goals: Good Progress towards PT goals: Progressing toward goals    Frequency    7X/week      PT Plan Current plan remains appropriate    Co-evaluation              AM-PAC PT "6 Clicks" Mobility   Outcome Measure  Help needed turning from your back to your side while in a flat bed without using bedrails?: None Help needed moving from lying on your back to sitting on the side of a flat bed without using bedrails?: A Little Help needed moving to and from a bed to a chair (including a wheelchair)?: A Little Help needed standing up from a chair using your arms (e.g.,  wheelchair or bedside chair)?: A Little Help needed to walk in hospital room?: A Little Help needed climbing 3-5 steps with a railing? : A Lot 6 Click Score: 18    End of Session Equipment Utilized During Treatment: Gait belt Activity Tolerance: Patient tolerated treatment well Patient left: in bed;with call bell/phone within reach;with family/visitor present Nurse Communication: Mobility status PT Visit Diagnosis: Muscle weakness (generalized) (M62.81);Unsteadiness on feet (R26.81)     Time: 1734-1800 PT Time Calculation (min) (ACUTE ONLY): 26 min  Charges:  $Gait Training: 8-22 mins $Therapeutic Exercise: 8-22 mins                     [name] , PTA Acute Rehabilitation Services Pager [phone] Office [phone]     Keriana Sarsfield Eli Hose 11/17/2019, 6:04 PM

## 2019-11-17 NOTE — Telephone Encounter (Signed)
Provider to contact patient as she has already had her surgery and update her on the note.  Paydon Carll,CMA

## 2019-11-17 NOTE — Progress Notes (Addendum)
Physical Therapy Treatment Patient Details Name: [name] MRN: [id number] DOB: [birth year] Today's Date: 11/17/2019    History of Present Illness Pt is a 64 y/o female s/p R TKA. Pt also with episode of bradycardia during surgery; cardiology on board.  PMH includes HTN, DM, a fib, and CAD.     PT Comments    Pt supine in bed on arrival this session.  Pt is progressing well.  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  Pt will need to be independent to return home to caregiver role.  Based on her living situation feel SNF remains most appropriate to maximize functional gains before returning home.  Will f/u in pm to progress functional mobility.   Follow Up Recommendations  Follow surgeon's recommendation for DC plan and follow-up therapies;Supervision for mobility/OOB     Equipment Recommendations  None recommended by PT    Recommendations for Other Services       Precautions / Restrictions Precautions Precautions: Knee Precaution Booklet Issued: No Precaution Comments: Verbally reviewed knee precautions with pt.  Restrictions Weight Bearing Restrictions: Yes RLE Weight Bearing: Weight bearing as tolerated    Mobility  Bed Mobility Overal bed mobility: Needs Assistance Bed Mobility: Supine to Sit     Supine to sit: Supervision     General bed mobility comments: No assistance needed.  Transfers Overall transfer level: Needs assistance Equipment used: Rolling walker (2 wheeled) Transfers: Sit to/from Stand Sit to Stand: Supervision         General transfer comment: Cues for hand placement and safety with RW. tendency to leave RW to the side when backing to seated surface.  Ambulation/Gait Ambulation/Gait assistance: Min guard Gait Distance (Feet): 120 Feet Assistive device: Rolling walker (2 wheeled) Gait Pattern/deviations: Decreased step length - right;Decreased step length - left;Antalgic;Step-through pattern;Trunk flexed Gait velocity:  Decreased   General Gait Details: Cues for hipext and scap retraction to improve posture.  Pt required adjustment of RW to improve fit.  Pt continues to improve and progressed to step throuh pattern with increased distance.   Stairs             Wheelchair Mobility    Modified Rankin (Stroke Patients Only)       Balance Overall balance assessment: Needs assistance Sitting-balance support: No upper extremity supported;Feet supported Sitting balance-Leahy Scale: Good       Standing balance-Leahy Scale: Fair                              Cognition Arousal/Alertness: Awake/alert Behavior During Therapy: WFL for tasks assessed/performed Overall Cognitive Status: Within Functional Limits for tasks assessed                                        Exercises Total Joint Exercises Ankle Circles/Pumps: AROM;Both;20 reps;Supine Quad Sets: AROM;Right;10 reps;Supine Heel Slides: AAROM;Right;10 reps;Supine Hip ABduction/ADduction: AAROM;Right;10 reps;Supine Straight Leg Raises: AAROM;Right;10 reps;Supine    General Comments        Pertinent Vitals/Pain Pain Assessment: 0-10 Pain Score: 3  Pain Location:  Rknee Pain Descriptors / Indicators: Aching;Operative site guarding Pain Intervention(s): Monitored during session;Repositioned    Home Living                      Prior Function  PT Goals (current goals can now be found in the care plan section) Acute Rehab PT Goals Patient Stated Goal: to go home Potential to Achieve Goals: Good Progress towards PT goals: Progressing toward goals    Frequency    7X/week      PT Plan Current plan remains appropriate    Co-evaluation              AM-PAC PT "6 Clicks" Mobility   Outcome Measure  Help needed turning from your back to your side while in a flat bed without using bedrails?: None Help needed moving from lying on your back to sitting on the side of a flat  bed without using bedrails?: A Little Help needed moving to and from a bed to a chair (including a wheelchair)?: A Little Help needed standing up from a chair using your arms (e.g., wheelchair or bedside chair)?: A Little Help needed to walk in hospital room?: A Little Help needed climbing 3-5 steps with a railing? : A Lot 6 Click Score: 18    End of Session Equipment Utilized During Treatment: Gait belt Activity Tolerance: Patient tolerated treatment well Patient left: in bed;with call bell/phone within reach;with family/visitor present Nurse Communication: Mobility status;Other (comment)(pt voided) PT Visit Diagnosis: Muscle weakness (generalized) (M62.81);Unsteadiness on feet (R26.81)     Time: 1008-1050 PT Time Calculation (min) (ACUTE ONLY): 42 min  Charges:  $Gait Training: 8-22 mins $Therapeutic Exercise: 8-22 mins $Therapeutic Activity: 8-22 mins                     Aimee R. , PTA Acute Rehabilitation Services Pager [phone] Office [phone]     Aimee Eli Hose 11/17/2019, 11:02 AM

## 2019-11-17 NOTE — Progress Notes (Signed)
Inpatient Diabetes Program Recommendations  AACE/ADA: New Consensus Statement on Inpatient Glycemic Control (2015)  Target Ranges:  Prepandial:   less than 140 mg/dL      Peak postprandial:   less than 180 mg/dL (1-2 hours)      Critically ill patients:  140 - 180 mg/dL   Lab Results  Component Value Date   GLUCAP 113 (H) 11/17/2019   HGBA1C 7.6 (A) 10/29/2019    Review of Glycemic Control Results for [name], [name] (MRN [id number]) as of 11/17/2019 11:58  Ref. Range 11/12/2019 08:06 11/16/2019 06:15 11/16/2019 11:11 11/16/2019 18:14 11/16/2019 20:33 11/17/2019 07:52  Glucose-Capillary Latest Ref Range: 70 - 99 mg/dL 121 (H) 129 (H) 106 (H) 204 (H) 503 (HH) 113 (H)   Diabetes history: DM 2 Outpatient Diabetes medications:  Metformin 1000 mg with breakfast Current orders for Inpatient glycemic control: Novolog moderate tid with meals Novolog 4 units tid with meals Inpatient Diabetes Program Recommendations:   Unsure why blood sugars spiked last PM?  Agree with change to CHO modified diet.  A1C indicates fair control of CBG's prior to admit.  Will follow.   Thanks, Adah Perl, RN, BC-ADM Inpatient Diabetes Coordinator Pager [phone] (8a-5p)

## 2019-11-17 NOTE — Progress Notes (Signed)
  Subjective: [name] is a 64 y.o. female s/p right TKA.  They are POD1.  Pt's pain is controlled.  Pt has ambulated with some difficulty.  She had an episode of nausea/vomiting with a "groggy" feeling this morning around 8:15 AM.  She denies any palpitations, chest pain, SOB during the episode.  It lasted for several minutes before resolving.  This was associated with a drop in her HR down to 30-40   Objective: Vital signs in last 24 hours: Temp:  [97.2 F (36.2 C)-98.4 F (36.9 C)] 98.4 F (36.9 C) (05/26 0818) Pulse Rate:  [29-72] 71 (05/26 0818) Resp:  [12-19] 14 (05/26 0818) BP: (99-176)/(60-87) 157/78 (05/26 0818) SpO2:  [92 %-100 %] 96 % (05/26 0818)  Intake/Output from previous day: 05/25 0701 - 05/26 0700 In: 1000 [I.V.:1000] Out: 400 [Urine:300; Blood:100] Intake/Output this shift: No intake/output data recorded.  Exam:  No gross blood or drainage overlying the dressing 2+ DP pulse Sensation intact distally in the right foot Able to dorsiflex and plantarflex the right foot   Labs: Recent Labs    11/17/19 0337  HGB 10.0*   Recent Labs    11/17/19 0337  WBC 8.3  RBC 4.11  HCT 33.9*  PLT 180   No results for input(s): NA, K, CL, CO2, BUN, CREATININE, GLUCOSE, CALCIUM in the last 72 hours. Recent Labs    11/16/19 0600  INR 1.0    Assessment/Plan: Pt is POD1 s/p right TKA.    -Plan to discharge to SNF today or tomorrow pending patient's pain and PT eval and cardiology evaluation  -WBAT with a walker     Edword Cu L Jermiah Howton 11/17/2019, 8:50 AM

## 2019-11-17 NOTE — Op Note (Signed)
NAME: [name], [name] MEDICAL RECORD LE:[id number] ACCOUNT [id number] DATE OF BIRTH:1956/03/27 FACILITY: [facility] LOCATION: [facility] PHYSICIAN:Trudi Morgenthaler Randel Pigg, MD  OPERATIVE REPORT  DATE OF PROCEDURE:  11/16/2019  PREOPERATIVE DIAGNOSIS:  Right knee arthritis.  POSTOPERATIVE DIAGNOSIS:  Right knee arthritis.  PROCEDURE:  Right total knee replacement using [employer] Triathlon cemented size 5 right cruciate retaining femoral with size 6 tibial baseplate, size 6, 11 mm thickness deep dish tibial bearing, and 32 mm cemented 3-peg patella.  SURGEON:  Meredith Pel, MD  ASSISTANT:  Annie Main, PA  INDICATIONS:  Akiva is a 64 year old patient with end-stage right knee arthritis, who presents for operative management after explanation of risks and benefits.  PROCEDURE IN DETAIL:  The patient was brought to the operating room where general anesthetic was induced.  Preoperative antibiotics administered.  Timeout was called.  Right leg prescrubbed with alcohol and Betadine, allowed to air dry.  Prepped with  DuraPrep solution and draped in sterile manner.  Ioban used to cover the operative field.  The leg was elevated, exsanguinated with the Esmarch wrap.  Tourniquet was inflated to 300 mmHg.  Anterior approach to knee was made.  Skin and subcutaneous tissue  were sharply divided.  Median parapatellar arthrotomy was made and marked with #1 Vicryl suture.  The fat pad was partially excised.  Minimal soft tissue dissection medially was performed due to the patient's preoperative valgus wear pattern.  Lateral  patellofemoral ligament was released.  Soft tissue removed from the anterior distal femur.  The patient did have tricompartmental arthritis, but it was worse in the lateral compartment.  ACL removed.  Next, the intramedullary alignment was used to make a  cut on the tibia perpendicular to the mechanical axis.  Initial cut was measured 2 mm off the lateral tibial plateau.  This was later revised 2  more millimeters.  Intramedullary alignment then used to cut the distal femur in 5 degrees of valgus.   Initially at 8 mm and this was revised to 10 millimeters in order to achieve full extension with both a 9 and 11 mm spacer.  Next, the femur was sized to a size 5.  The tibia was sized to a size 6.  Anterior, posterior chamfer cuts were made.  PCL was  preserved.  Next, the tibia was keel punched and trial components placed.  With trial components in position, the patient achieved full extension and good stability to varus and valgus stress at 0, 30 and 90 degrees with an 11 mm spacer.  Patella was  then cut from 24 to 14 and a three peg patella was placed.  The patient just like on the left knee, had contracture of the lateral retinacular structures, which were released.  This allowed for patellar tracking using no thumbs technique.  Thorough  irrigation performed at this time with 3 liters of pulsatile irrigating solution.  This was followed by numbing medicine consisting of Marcaine, Exparel and saline.  IrriSept solution was used after the incision as well as after the arthrotomy and at  multiple times during the case.  Tranexamic acid sponge was placed into the incision and allowed to set for 3 minutes.  Then, this was removed and the components were cemented into position.  Excess cement was removed.  Overall alignment was maintained.   An 11 mm spacer was utilized.  The patient had very good flexion and extension with good stability.  Tourniquet was released.  Bleeding points encountered controlled using electrocautery.  Arthrotomy closed using #  1 Vicryl suture followed by interrupted  inverted 0 Vicryl suture, 2-0 Vicryl suture and 3-0 Monocryl.  Solution of Marcaine, morphine, clonidine injected into the knee for postop pain relief.  Steri-Strips applied along with Aquacel and a bulky dressing and knee immobilizer.  Luke's  assistance was required at all times for opening and closing, mobilization  of tissue, as well as mobilization of the extremity.  His assistance was a medical necessity.  CN/NUANCE  D:11/16/2019 T:11/17/2019 JOB:[id number]

## 2019-11-17 NOTE — Progress Notes (Addendum)
   Pt rates stable overnight.   She did have episode of HRs in the 30s this am ~0812; but this in the setting of emesis. Likely vagal. Reviewed with Dr. Curt Bears.  Plan for SNF still appropriate, would be reasonable to continue to observe on tele overnight.   We will plan outpatient follow up in 4 weeks to further discuss potential pacemaker in the future.   Legrand Como [address] [place], PA-C  11/17/2019 10:35 AM

## 2019-11-17 NOTE — Progress Notes (Signed)
Pt experiencing sudden vomiting mid-breakfast.  Pt also experiencing brady rate of 30-40 x 2 minutes.  Pt states otherwise asymptomatic.

## 2019-11-18 ENCOUNTER — Observation Stay (HOSPITAL_COMMUNITY): Payer: Medicare HMO

## 2019-11-18 DIAGNOSIS — E1169 Type 2 diabetes mellitus with other specified complication: Secondary | ICD-10-CM | POA: Diagnosis present

## 2019-11-18 DIAGNOSIS — Z888 Allergy status to other drugs, medicaments and biological substances status: Secondary | ICD-10-CM | POA: Diagnosis not present

## 2019-11-18 DIAGNOSIS — I340 Nonrheumatic mitral (valve) insufficiency: Secondary | ICD-10-CM | POA: Diagnosis not present

## 2019-11-18 DIAGNOSIS — I152 Hypertension secondary to endocrine disorders: Secondary | ICD-10-CM | POA: Diagnosis present

## 2019-11-18 DIAGNOSIS — Z7984 Long term (current) use of oral hypoglycemic drugs: Secondary | ICD-10-CM | POA: Diagnosis not present

## 2019-11-18 DIAGNOSIS — E785 Hyperlipidemia, unspecified: Secondary | ICD-10-CM | POA: Diagnosis present

## 2019-11-18 DIAGNOSIS — Z6835 Body mass index (BMI) 35.0-35.9, adult: Secondary | ICD-10-CM | POA: Diagnosis not present

## 2019-11-18 DIAGNOSIS — E039 Hypothyroidism, unspecified: Secondary | ICD-10-CM | POA: Diagnosis present

## 2019-11-18 DIAGNOSIS — Z7901 Long term (current) use of anticoagulants: Secondary | ICD-10-CM | POA: Diagnosis not present

## 2019-11-18 DIAGNOSIS — Z961 Presence of intraocular lens: Secondary | ICD-10-CM | POA: Diagnosis present

## 2019-11-18 DIAGNOSIS — L409 Psoriasis, unspecified: Secondary | ICD-10-CM | POA: Diagnosis present

## 2019-11-18 DIAGNOSIS — E7800 Pure hypercholesterolemia, unspecified: Secondary | ICD-10-CM | POA: Diagnosis present

## 2019-11-18 DIAGNOSIS — Z9841 Cataract extraction status, right eye: Secondary | ICD-10-CM | POA: Diagnosis not present

## 2019-11-18 DIAGNOSIS — I2510 Atherosclerotic heart disease of native coronary artery without angina pectoris: Secondary | ICD-10-CM | POA: Diagnosis present

## 2019-11-18 DIAGNOSIS — I97191 Other postprocedural cardiac functional disturbances following other surgery: Secondary | ICD-10-CM | POA: Diagnosis not present

## 2019-11-18 DIAGNOSIS — Z96652 Presence of left artificial knee joint: Secondary | ICD-10-CM | POA: Diagnosis present

## 2019-11-18 DIAGNOSIS — G4733 Obstructive sleep apnea (adult) (pediatric): Secondary | ICD-10-CM | POA: Diagnosis present

## 2019-11-18 DIAGNOSIS — E1143 Type 2 diabetes mellitus with diabetic autonomic (poly)neuropathy: Secondary | ICD-10-CM | POA: Diagnosis present

## 2019-11-18 DIAGNOSIS — K219 Gastro-esophageal reflux disease without esophagitis: Secondary | ICD-10-CM | POA: Diagnosis present

## 2019-11-18 DIAGNOSIS — G3184 Mild cognitive impairment, so stated: Secondary | ICD-10-CM | POA: Diagnosis present

## 2019-11-18 DIAGNOSIS — Z20822 Contact with and (suspected) exposure to covid-19: Secondary | ICD-10-CM | POA: Diagnosis present

## 2019-11-18 DIAGNOSIS — Z9842 Cataract extraction status, left eye: Secondary | ICD-10-CM | POA: Diagnosis not present

## 2019-11-18 DIAGNOSIS — M1711 Unilateral primary osteoarthritis, right knee: Secondary | ICD-10-CM | POA: Diagnosis present

## 2019-11-18 DIAGNOSIS — Z91040 Latex allergy status: Secondary | ICD-10-CM | POA: Diagnosis not present

## 2019-11-18 DIAGNOSIS — E669 Obesity, unspecified: Secondary | ICD-10-CM | POA: Diagnosis present

## 2019-11-18 LAB — CBC
HCT: 31.8 % — ABNORMAL LOW (ref 36.0–46.0)
Hemoglobin: 9.5 g/dL — ABNORMAL LOW (ref 12.0–15.0)
MCH: 24.5 pg — ABNORMAL LOW (ref 26.0–34.0)
MCHC: 29.9 g/dL — ABNORMAL LOW (ref 30.0–36.0)
MCV: 82 fL (ref 80.0–100.0)
Platelets: 178 10*3/uL (ref 150–400)
RBC: 3.88 MIL/uL (ref 3.87–5.11)
RDW: 14.4 % (ref 11.5–15.5)
WBC: 6.5 10*3/uL (ref 4.0–10.5)
nRBC: 0 % (ref 0.0–0.2)

## 2019-11-18 LAB — GLUCOSE, CAPILLARY
Glucose-Capillary: 130 mg/dL — ABNORMAL HIGH (ref 70–99)
Glucose-Capillary: 124 mg/dL — ABNORMAL HIGH (ref 70–99)
Glucose-Capillary: 133 mg/dL — ABNORMAL HIGH (ref 70–99)
Glucose-Capillary: 172 mg/dL — ABNORMAL HIGH (ref 70–99)
Glucose-Capillary: 105 mg/dL — ABNORMAL HIGH (ref 70–99)
Glucose-Capillary: 222 mg/dL — ABNORMAL HIGH (ref 70–99)

## 2019-11-18 LAB — ECHOCARDIOGRAM COMPLETE
Height: 68 in
Weight: 3728 oz

## 2019-11-18 MED ORDER — TEDUGLUTIDE (RDNA) 5 MG ~~LOC~~ KIT
5.0000 mg | PACK | Freq: Every day | SUBCUTANEOUS | Status: DC
  Administered 2019-11-18 – 2019-11-19 (×2): 5 mg via SUBCUTANEOUS
  Filled 2019-11-18 (×3): qty 1

## 2019-11-18 NOTE — Progress Notes (Signed)
Physical Therapy Treatment Patient Details Name: [name] MRN: [id number] DOB: 04/30/1956 Today's Date: 11/18/2019    History of Present Illness Pt is a 64 y/o female s/p R TKA. Pt also with episode of bradycardia during surgery; cardiology on board.  PMH includes HTN, DM, a fib, and CAD.     PT Comments    Pt supine in bed on arrival.  More pain noted this session and slow movements noted during transition.  Pt required continued cues for safety this session.  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  Based on need to be independent to return home and care for spouse she would benefit from rehab in a post acute setting.     Follow Up Recommendations  Follow surgeon's recommendation for DC plan and follow-up therapies;Supervision for mobility/OOB     Equipment Recommendations  None recommended by PT    Recommendations for Other Services       Precautions / Restrictions Precautions Precautions: Knee Precaution Booklet Issued: No Precaution Comments: Verbally reviewed knee precautions with pt.  Restrictions Weight Bearing Restrictions: Yes RLE Weight Bearing: Weight bearing as tolerated    Mobility  Bed Mobility Overal bed mobility: Needs Assistance Bed Mobility: Supine to Sit;Sit to Supine     Supine to sit: Supervision     General bed mobility comments: No assistance to move to edge of bed.  Supervision for safety.  Transfers Overall transfer level: Needs assistance Equipment used: Rolling walker (2 wheeled) Transfers: Sit to/from Stand Sit to Stand: Supervision         General transfer comment: Cues for hand placement and safety with RW. Pt presents with poor eccentric load back to seated surface.  Ambulation/Gait Ambulation/Gait assistance: Min guard Gait Distance (Feet): 200 Feet Assistive device: Rolling walker (2 wheeled) Gait Pattern/deviations: Decreased step length - right;Decreased step length  - left;Antalgic;Step-through pattern;Trunk flexed     General Gait Details: Continues cues for gt symmetry with step through pattern.  Cues for pacing.  x 2 standing rest breaks.   Stairs             Wheelchair Mobility    Modified Rankin (Stroke Patients Only)       Balance Overall balance assessment: Needs assistance Sitting-balance support: No upper extremity supported;Feet supported Sitting balance-Leahy Scale: Good       Standing balance-Leahy Scale: Fair                              Cognition Arousal/Alertness: Awake/alert Behavior During Therapy: WFL for tasks assessed/performed Overall Cognitive Status: Within Functional Limits for tasks assessed                                        Exercises Total Joint Exercises Ankle Circles/Pumps: AROM;Both;20 reps;Supine Quad Sets: AROM;Right;10 reps;Supine Heel Slides: AAROM;Right;10 reps;Supine Hip ABduction/ADduction: AAROM;Right;10 reps;Supine;AROM Straight Leg Raises: AAROM;Right;10 reps;Supine;AROM    General Comments        Pertinent Vitals/Pain Pain Assessment: 0-10 Pain Score: 4  Pain Location:  Rknee Pain Descriptors / Indicators: Aching;Operative site guarding Pain Intervention(s): Monitored during session;Repositioned    Home Living                      Prior Function  PT Goals (current goals can now be found in the care plan section) Acute Rehab PT Goals Patient Stated Goal: to go home Potential to Achieve Goals: Good Progress towards PT goals: Progressing toward goals    Frequency    7X/week      PT Plan Current plan remains appropriate    Co-evaluation              AM-PAC PT "6 Clicks" Mobility   Outcome Measure  Help needed turning from your back to your side while in a flat bed without using bedrails?: None Help needed moving from lying on your back to sitting on the side of a flat bed without using bedrails?: A  Little Help needed moving to and from a bed to a chair (including a wheelchair)?: A Little Help needed standing up from a chair using your arms (e.g., wheelchair or bedside chair)?: A Little Help needed to walk in hospital room?: A Little Help needed climbing 3-5 steps with a railing? : A Little 6 Click Score: 19    End of Session Equipment Utilized During Treatment: Gait belt Activity Tolerance: Patient tolerated treatment well Patient left: in bed;with call bell/phone within reach;with family/visitor present;with bed alarm set(seated edge of bed but alarm is activated) Nurse Communication: Mobility status PT Visit Diagnosis: Muscle weakness (generalized) (M62.81);Unsteadiness on feet (R26.81)     Time: 4665-9935 PT Time Calculation (min) (ACUTE ONLY): 20 min  Charges:  $Gait Training: 8-22 mins                     [name] , PTA Acute Rehabilitation Services Pager [phone] Office [phone]     Clotilda Hafer Eli Hose 11/18/2019, 10:33 AM

## 2019-11-18 NOTE — Plan of Care (Signed)

## 2019-11-18 NOTE — Evaluation (Signed)
Occupational Therapy Evaluation Patient Details Name: [name] MRN: [id number] DOB: [birth year] Today's Date: 11/18/2019    History of Present Illness Pt is a 64 y/o female s/p R TKA. Pt also with episode of bradycardia during surgery; cardiology on board.  PMH includes HTN, DM, a fib, and CAD.    Clinical Impression   Pt admitted with above. She demonstrates the below listed deficits and will benefit from continued OT to maximize safety and independence with BADLs.  Pt is able to perform ADLs with min guard assist, however, requires min - total A for IADLs.  Pt lives with spouse, who she reports is paralyzed and who, she reports requires 24/7 assist with all ADLs, and pt is his primary caregiver - daughter and a friend are currently assisting him.  Feel she would benefit from SNF level rehab to allow her regain independence with IADLs and be able to resume caregiving role.        Follow Up Recommendations  SNF;Supervision/Assistance - 24 hour    Equipment Recommendations  None recommended by OT    Recommendations for Other Services       Precautions / Restrictions Precautions Precautions: Knee Precaution Booklet Issued: No Precaution Comments: Verbally reviewed knee precautions with pt.  Restrictions Weight Bearing Restrictions: Yes RLE Weight Bearing: Weight bearing as tolerated      Mobility Bed Mobility Overal bed mobility: Needs Assistance Bed Mobility: Supine to Sit;Sit to Supine     Supine to sit: Supervision     General bed mobility comments: sitting EOB   Transfers Overall transfer level: Needs assistance Equipment used: Rolling walker (2 wheeled) Transfers: Sit to/from [facility] Sit to Stand: Supervision Stand pivot transfers: Min guard       General transfer comment: min guard for safety     Balance Overall balance assessment: Needs assistance Sitting-balance support: No upper extremity supported;Feet supported Sitting  balance-Leahy Scale: Good     Standing balance support: During functional activity;Single extremity supported Standing balance-Leahy Scale: Fair Standing balance comment: able to maintain static standing with min guard assist without UE support                            ADL either performed or assessed with clinical judgement   ADL Overall ADL's : Needs assistance/impaired Eating/Feeding: Independent   Grooming: Wash/dry hands;Wash/dry face;Oral care;Brushing hair;Min guard;Standing   Upper Body Bathing: Set up;Sitting   Lower Body Bathing: Min guard;Sit to/from stand   Upper Body Dressing : Set up;Sitting   Lower Body Dressing: Min guard;Sit to/from stand   Toilet Transfer: Min guard;Ambulation;Comfort height toilet;BSC;RW   Toileting- Clothing Manipulation and Hygiene: Min guard       Functional mobility during ADLs: Min guard;Rolling walker General ADL Comments: min guard for safety      Vision         Perception     Praxis      Pertinent Vitals/Pain Pain Assessment: Faces Pain Score: 4  Faces Pain Scale: Hurts little more Pain Location:  Rknee Pain Descriptors / Indicators: Grimacing;Guarding Pain Intervention(s): Monitored during session;Repositioned     Hand Dominance Right   Extremity/Trunk Assessment Upper Extremity Assessment Upper Extremity Assessment: Overall WFL for tasks assessed   Lower Extremity Assessment Lower Extremity Assessment: Defer to PT evaluation   Cervical / Trunk Assessment Cervical / Trunk Assessment: Normal   Communication Communication Communication: No difficulties   Cognition Arousal/Alertness: Awake/alert Behavior During Therapy: [facility]  for tasks assessed/performed Overall Cognitive Status: Within Functional Limits for tasks assessed                                     General Comments  Pt is very stressed re: need to provide care for spouse and limited resources.  Encouraged her to contact  Independent living as well as the [place] association to see if they have any resources.  Recommend she sit to shower initiall, and she verbalized understanding     Exercises Total Joint Exercises Ankle Circles/Pumps: AROM;Both;20 reps;Supine Quad Sets: AROM;Right;10 reps;Supine Heel Slides: AAROM;Right;10 reps;Supine Hip ABduction/ADduction: AAROM;Right;10 reps;Supine;AROM Straight Leg Raises: AAROM;Right;10 reps;Supine;AROM   Shoulder Instructions      Home Living Family/patient expects to be discharged to:: Private residence Living Arrangements: Spouse/significant other;Non-relatives/Friends Available Help at Discharge: Friend(s);Family;Available PRN/intermittently Type of Home: House Home Access: Ramped entrance     Home Layout: One level     Bathroom Shower/Tub: Occupational psychologist: Handicapped height Bathroom Accessibility: Yes   Home Equipment: Environmental consultant - 2 wheels;Shower seat;Grab bars - toilet;Grab bars - tub/shower   Additional Comments: Pt lives with spouse for whom she provides physical assistance/care 24/7.  They have a friend who lives with them, and daughter who assists intermittently, but does work       Prior Functioning/Environment Level of Independence: Independent        Comments: Pt is caregiver for spouse         OT Problem List: Decreased strength;Decreased activity tolerance;Impaired balance (sitting and/or standing);Decreased knowledge of use of DME or AE;Pain      OT Treatment/Interventions: Self-care/ADL training;DME and/or AE instruction;Therapeutic activities;Patient/family education;Balance training    OT Goals(Current goals can be found in the care plan section) Acute Rehab OT Goals Patient Stated Goal: to be able to take care of husband  OT Goal Formulation: With patient Time For Goal Achievement: 12/02/19 Potential to Achieve Goals: Good ADL Goals Pt Will Perform Grooming: (P) with modified independence;standing Pt Will  Perform Upper Body Bathing: (P) with modified independence;standing;sitting Pt Will Perform Lower Body Bathing: (P) with modified independence;sit to/from stand Pt Will Perform Upper Body Dressing: (P) with modified independence;sitting Pt Will Perform Lower Body Dressing: (P) with modified independence;sit to/from stand Pt Will Transfer to Toilet: (P) with modified independence;ambulating;regular height toilet;bedside commode;grab bars Pt Will Perform Toileting - Clothing Manipulation and hygiene: (P) with modified independence;sit to/from stand  OT Frequency: Min 2X/week   Barriers to D/C: Decreased caregiver support  Pt is full time caregiver for spouse        Co-evaluation              AM-PAC OT "6 Clicks" Daily Activity     Outcome Measure Help from another person eating meals?: None Help from another person taking care of personal grooming?: A Little Help from another person toileting, which includes using toliet, bedpan, or urinal?: A Little Help from another person bathing (including washing, rinsing, drying)?: A Little Help from another person to put on and taking off regular upper body clothing?: A Little Help from another person to put on and taking off regular lower body clothing?: A Little 6 Click Score: 19   End of Session Equipment Utilized During Treatment: Gait belt;Rolling walker CPM Right Knee CPM Right Knee: Off Nurse Communication: Mobility status  Activity Tolerance: Patient tolerated treatment well Patient left: in chair;with call bell/phone within reach;with  chair alarm set  OT Visit Diagnosis: Unsteadiness on feet (R26.81);Pain Pain - Right/Left: Left Pain - part of body: Knee                Time: 8110-3159 OT Time Calculation (min): 24 min Charges:  OT General Charges $OT Visit: 1 Visit OT Evaluation $OT Eval Moderate Complexity: 1 Mod OT Treatments $Self Care/Home Management : 8-22 mins  Nilsa Nutting., OTR/L Acute Rehabilitation  Services Pager [phone] Office [phone]   Lucille Passy M 11/18/2019, 11:54 AM

## 2019-11-18 NOTE — Progress Notes (Signed)
  Tele remains stable.   Pt had short bradycardia this am that was again likely vagal in the setting of retching.   She does have PVCs vs rate related abherrancy.   With PPM consideration pending and unclear history of CAD; I will order an echocardiogram for completeness.   Legrand Como [address]" [place], PA-C  11/18/2019 1:59 PM

## 2019-11-18 NOTE — Progress Notes (Signed)
  Echocardiogram 2D Echocardiogram has been performed.  [name] 11/18/2019, 3:24 PM

## 2019-11-18 NOTE — Progress Notes (Signed)
Physical Therapy Treatment Patient Details Name: [name] MRN: [id number] DOB: [birth year] Today's Date: 11/18/2019    History of Present Illness Pt is a 64 y/o female s/p R TKA. Pt also with episode of bradycardia during surgery; cardiology on board.  PMH includes HTN, DM, a fib, and CAD.     PT Comments    Pt coming out of the shower this pm with assistance from her daughter.  She continues to make great functional gains and overall she is requiring supervision for all mobility.  She continues to lack caregiver support as her husband is disabled and daughter can only intermittently assist.  Continue to follow acutely until d/c.      Follow Up Recommendations  Follow surgeon's recommendation for DC plan and follow-up therapies;Supervision for mobility/OOB     Equipment Recommendations  None recommended by PT    Recommendations for Other Services       Precautions / Restrictions Precautions Precautions: Knee Precaution Booklet Issued: No Restrictions Weight Bearing Restrictions: Yes RLE Weight Bearing: Weight bearing as tolerated    Mobility  Bed Mobility Overal bed mobility: Needs Assistance Bed Mobility: Sit to Supine       Sit to supine: Supervision      Transfers Overall transfer level: Needs assistance Equipment used: Rolling walker (2 wheeled) Transfers: Sit to/from Stand Sit to Stand: Supervision         General transfer comment: Supervision for safety.  Ambulation/Gait Ambulation/Gait assistance: Supervision Gait Distance (Feet): 225 Feet Assistive device: Rolling walker (2 wheeled) Gait Pattern/deviations: Decreased step length - right;Decreased step length - left;Antalgic;Step-through pattern;Trunk flexed     General Gait Details: Continues cues for gt symmetry with step through pattern.  Cues for pacing.  x 2 standing rest breaks.   Stairs Stairs: Yes Stairs assistance: Supervision Stair Management: Two rails Number of Stairs:  2 General stair comments: Cues for sequencing and safety.   Wheelchair Mobility    Modified Rankin (Stroke Patients Only)       Balance Overall balance assessment: Needs assistance Sitting-balance support: No upper extremity supported;Feet supported Sitting balance-Leahy Scale: Good     Standing balance support: During functional activity;Single extremity supported Standing balance-Leahy Scale: Fair                              Cognition Arousal/Alertness: Awake/alert Behavior During Therapy: WFL for tasks assessed/performed Overall Cognitive Status: Within Functional Limits for tasks assessed                                        Exercises Total Joint Exercises Ankle Circles/Pumps: AROM;Both;20 reps;Supine Quad Sets: AROM;Right;10 reps;Supine Heel Slides: AAROM;Right;10 reps;Supine Hip ABduction/ADduction: AAROM;Right;10 reps;Supine;AROM Straight Leg Raises: AAROM;Right;10 reps;Supine;AROM    General Comments        Pertinent Vitals/Pain Pain Assessment: Faces Faces Pain Scale: Hurts little more Pain Location:  Rknee Pain Descriptors / Indicators: Grimacing;Guarding Pain Intervention(s): Monitored during session;Repositioned    Home Living                      Prior Function            PT Goals (current goals can now be found in the care plan section) Acute Rehab PT Goals Patient Stated Goal: to be able to take care of husband  Potential to Achieve Goals:  Good Progress towards PT goals: Progressing toward goals    Frequency    7X/week      PT Plan Current plan remains appropriate    Co-evaluation              AM-PAC PT "6 Clicks" Mobility   Outcome Measure  Help needed turning from your back to your side while in a flat bed without using bedrails?: None Help needed moving from lying on your back to sitting on the side of a flat bed without using bedrails?: A Little Help needed moving to and from a  bed to a chair (including a wheelchair)?: A Little Help needed standing up from a chair using your arms (e.g., wheelchair or bedside chair)?: A Little Help needed to walk in hospital room?: A Little Help needed climbing 3-5 steps with a railing? : A Little 6 Click Score: 19    End of Session Equipment Utilized During Treatment: Gait belt Activity Tolerance: Patient tolerated treatment well Patient left: in bed;with call bell/phone within reach;with family/visitor present;with bed alarm set Nurse Communication: Mobility status PT Visit Diagnosis: Muscle weakness (generalized) (M62.81);Unsteadiness on feet (R26.81)     Time: 0240-9735 PT Time Calculation (min) (ACUTE ONLY): 26 min  Charges:  $Gait Training: 8-22 mins $Therapeutic Exercise: 8-22 mins                     [name] , PTA Acute Rehabilitation Services Pager [phone] Office [phone]     Janeice Stegall Eli Hose 11/18/2019, 4:31 PM

## 2019-11-18 NOTE — Plan of Care (Signed)

## 2019-11-18 NOTE — Plan of Care (Signed)

## 2019-11-19 LAB — GLUCOSE, CAPILLARY
Glucose-Capillary: 176 mg/dL — ABNORMAL HIGH (ref 70–99)
Glucose-Capillary: 169 mg/dL — ABNORMAL HIGH (ref 70–99)
Glucose-Capillary: 124 mg/dL — ABNORMAL HIGH (ref 70–99)
Glucose-Capillary: 155 mg/dL — ABNORMAL HIGH (ref 70–99)

## 2019-11-19 NOTE — Discharge Summary (Addendum)
Physician Discharge Summary      Patient ID: [name] MRN: [id number] DOB/AGE: [birth year] 64 y.o.  Admit date: 11/16/2019 Discharge date: 11/23/2019 Admission Diagnoses:  Active Problems:   S/P total knee arthroplasty, right   Discharge Diagnoses:  Same  Surgeries: Procedure(s): RIGHT TOTAL KNEE ARTHROPLASTY-CEMENTED on 11/16/2019   Consultants:   Discharged Condition: Stable  Hospital Course: [name] is an 64 y.o. female who was admitted 11/16/2019 with a chief complaint of right knee pain, and found to have a diagnosis of right knee OA.  They were brought to the operating room on 11/16/2019 and underwent the above named procedures.  Pt awoke from anesthesia and had significant bradycardia to ~30 bpm.  She had several more episodes during her stay that was followed by cardiology and determined to be a vagal response.  Plan for outpatient cardiology follow-up. Throughout her stay, her mobility has significantly progressed and she is able to ambulate relatively easily with a walker for balance.  Pt will be discharged to SNF. Pt will f/u with Dr. Marlou Sa in clinic in ~2 weeks.   Antibiotics given:  Anti-infectives (From admission, onward)   Start     Dose/Rate Route Frequency Ordered Stop   11/16/19 2000  vancomycin (VANCOCIN) IVPB 1000 mg/200 mL premix     1,000 mg 200 mL/hr over 60 Minutes Intravenous Every 12 hours 11/16/19 1510 11/16/19 2049   11/16/19 0948  vancomycin (VANCOCIN) powder  Status:  Discontinued       As needed 11/16/19 0948 11/16/19 1051   11/16/19 0600  vancomycin (VANCOCIN) IVPB 1000 mg/200 mL premix  Status:  Discontinued     1,000 mg 200 mL/hr over 60 Minutes Intravenous On call to O.R. 11/16/19 4174 11/16/19 0559   11/16/19 0600  vancomycin (VANCOREADY) IVPB 1500 mg/300 mL     1,500 mg 150 mL/hr over 120 Minutes Intravenous On call to O.R. 11/16/19 0559 11/16/19 0744    .  Recent vital signs:  Vitals:   11/19/19 0746 11/19/19 1030  BP: 102/66 (!)  123/55  Pulse: (!) 48 65  Resp: 17   Temp: 97.7 F (36.5 C)   SpO2: 97% 94%    Recent laboratory studies:  Results for orders placed or performed during the hospital encounter of 11/16/19  PT- INR at PAT visit (Pre-admission Testing)  Result Value Ref Range   Prothrombin Time 12.6 11.4 - 15.2 seconds   INR 1.0 0.8 - 1.2  Glucose, capillary  Result Value Ref Range   Glucose-Capillary 129 (H) 70 - 99 mg/dL  Glucose, capillary  Result Value Ref Range   Glucose-Capillary 106 (H) 70 - 99 mg/dL  CBC  Result Value Ref Range   WBC 8.3 4.0 - 10.5 K/uL   RBC 4.11 3.87 - 5.11 MIL/uL   Hemoglobin 10.0 (L) 12.0 - 15.0 g/dL   HCT 33.9 (L) 36.0 - 46.0 %   MCV 82.5 80.0 - 100.0 fL   MCH 24.3 (L) 26.0 - 34.0 pg   MCHC 29.5 (L) 30.0 - 36.0 g/dL   RDW 13.9 11.5 - 15.5 %   Platelets 180 150 - 400 K/uL   nRBC 0.0 0.0 - 0.2 %  Glucose, capillary  Result Value Ref Range   Glucose-Capillary 204 (H) 70 - 99 mg/dL  Glucose, capillary  Result Value Ref Range   Glucose-Capillary 503 (HH) 70 - 99 mg/dL   Comment 1 Notify RN   Glucose, capillary  Result Value Ref Range   Glucose-Capillary 113 (H)  70 - 99 mg/dL  CBC  Result Value Ref Range   WBC 6.5 4.0 - 10.5 K/uL   RBC 3.88 3.87 - 5.11 MIL/uL   Hemoglobin 9.5 (L) 12.0 - 15.0 g/dL   HCT 31.8 (L) 36.0 - 46.0 %   MCV 82.0 80.0 - 100.0 fL   MCH 24.5 (L) 26.0 - 34.0 pg   MCHC 29.9 (L) 30.0 - 36.0 g/dL   RDW 14.4 11.5 - 15.5 %   Platelets 178 150 - 400 K/uL   nRBC 0.0 0.0 - 0.2 %  Glucose, capillary  Result Value Ref Range   Glucose-Capillary 124 (H) 70 - 99 mg/dL  Glucose, capillary  Result Value Ref Range   Glucose-Capillary 133 (H) 70 - 99 mg/dL  Glucose, capillary  Result Value Ref Range   Glucose-Capillary 124 (H) 70 - 99 mg/dL  Glucose, capillary  Result Value Ref Range   Glucose-Capillary 130 (H) 70 - 99 mg/dL  Glucose, capillary  Result Value Ref Range   Glucose-Capillary 172 (H) 70 - 99 mg/dL  Glucose, capillary  Result  Value Ref Range   Glucose-Capillary 105 (H) 70 - 99 mg/dL  Glucose, capillary  Result Value Ref Range   Glucose-Capillary 222 (H) 70 - 99 mg/dL  Glucose, capillary  Result Value Ref Range   Glucose-Capillary 176 (H) 70 - 99 mg/dL  Glucose, capillary  Result Value Ref Range   Glucose-Capillary 169 (H) 70 - 99 mg/dL  ECHOCARDIOGRAM COMPLETE  Result Value Ref Range   Weight 3,728 oz   Height 68 in   BP 135/66 mmHg    Discharge Medications:     Diagnostic Studies: ECHOCARDIOGRAM COMPLETE  Result Date: 11/18/2019    ECHOCARDIOGRAM REPORT   Patient Name:   [name] Date of Exam: 11/18/2019 Medical Rec #:  [id number]     Height:       68.0 in Accession #:    [id number]    Weight:       233.0 lb Date of Birth:  January 30, 1956     BSA:          2.181 m Patient Age:    64 years      BP:           135/66 mmHg Patient Gender: F             HR:           75 bpm. Exam Location:  Inpatient Procedure: 2D Echo, Cardiac Doppler and Color Doppler Indications:    Atrial Fibrillation 427.31 / I48.91  History:        Patient has no prior history of Echocardiogram examinations.                 CAD; Risk Factors:Diabetes, Dyslipidemia and Former Smoker.                 GERD.  Sonographer:    Vickie Epley RDCS Referring Phys: [id number] [place]  1. Left ventricular ejection fraction, by estimation, is 55 to 60%. The left ventricle has normal function. The left ventricle has no regional wall motion abnormalities. Left ventricular diastolic function could not be evaluated.  2. Right ventricular systolic function is normal. The right ventricular size is normal. There is normal pulmonary artery systolic pressure.  3. The mitral valve is abnormal. Trivial mitral valve regurgitation.  4. The aortic valve is tricuspid. Aortic valve regurgitation is not visualized. FINDINGS  Left Ventricle: Left ventricular ejection fraction, by estimation,  is 55 to 60%. The left ventricle has normal function. The left  ventricle has no regional wall motion abnormalities. The left ventricular internal cavity size was normal in size. There is  no left ventricular hypertrophy. Left ventricular diastolic function could not be evaluated due to atrial fibrillation. Left ventricular diastolic function could not be evaluated. Right Ventricle: The right ventricular size is normal. No increase in right ventricular wall thickness. Right ventricular systolic function is normal. There is normal pulmonary artery systolic pressure. The tricuspid regurgitant velocity is 2.15 m/s, and  with an assumed right atrial pressure of 8 mmHg, the estimated right ventricular systolic pressure is 59.5 mmHg. Left Atrium: Left atrial size was normal in size. Right Atrium: Right atrial size was normal in size. Pericardium: There is no evidence of pericardial effusion. Mitral Valve: The mitral valve is abnormal. Mild to moderate mitral annular calcification. Trivial mitral valve regurgitation. Tricuspid Valve: The tricuspid valve is grossly normal. Tricuspid valve regurgitation is trivial. Aortic Valve: The aortic valve is tricuspid. Aortic valve regurgitation is not visualized. Pulmonic Valve: The pulmonic valve was normal in structure. Pulmonic valve regurgitation is not visualized. Aorta: The aortic root, ascending aorta, aortic arch and descending aorta are all structurally normal, with no evidence of dilitation or obstruction. IAS/Shunts: No atrial level shunt detected by color flow Doppler.  LEFT VENTRICLE PLAX 2D LVIDd:         5.24 cm LVIDs:         3.83 cm LV PW:         1.00 cm LV IVS:        0.99 cm LVOT diam:     2.30 cm LV SV:         58 LV SV Index:   27 LVOT Area:     4.15 cm  LV Volumes (MOD) LV vol d, MOD A2C: 142.0 ml LV vol d, MOD A4C: 127.0 ml LV vol s, MOD A2C: 77.8 ml LV vol s, MOD A4C: 53.4 ml LV SV MOD A2C:     64.2 ml LV SV MOD A4C:     127.0 ml LV SV MOD BP:      70.6 ml RIGHT VENTRICLE RV S prime:     11.50 cm/s TAPSE (M-mode): 1.5 cm  LEFT ATRIUM             Index       RIGHT ATRIUM           Index LA diam:        5.00 cm 2.29 cm/m  RA Area:     17.40 cm LA Vol (A2C):   42.4 ml 19.44 ml/m RA Volume:   47.40 ml  21.73 ml/m LA Vol (A4C):   64.8 ml 29.71 ml/m LA Biplane Vol: 54.1 ml 24.81 ml/m  AORTIC VALVE LVOT Vmax:   75.70 cm/s LVOT Vmean:  49.700 cm/s LVOT VTI:    0.140 m  AORTA Ao Root diam: 3.30 cm TRICUSPID VALVE TR Peak grad:   18.5 mmHg TR Vmax:        215.00 cm/s  SHUNTS Systemic VTI:  0.14 m Systemic Diam: 2.30 cm Lyman Bishop MD Electronically signed by Lyman Bishop MD Signature Date/Time: 11/18/2019/4:14:57 PM    Final     Disposition:     Follow-up Information    Thompson Grayer, MD Follow up on 12/29/2019.   Specialty: Cardiology Why: at 3 pm for post hospital follow up Contact information: [place]  [place] 11/19/2019, 3:24 PM

## 2019-11-19 NOTE — Progress Notes (Signed)
Physical Therapy Treatment Patient Details Name: [name] MRN: [id number] DOB: 10-20-1955 Today's Date: 11/19/2019    History of Present Illness Pt is a 64 y/o female s/p R TKA. Pt also with episode of bradycardia during surgery; cardiology on board.  PMH includes HTN, DM, a fib, and CAD.     PT Comments    Pt exiting bathroom with RN on arrival.  Performed gt in halls with better carryover with step through pattern.  Pt continues to require min guard for safety.  Pt continue to improve but will need post acute rehab to return to independence to care for her disabled husband.  She lacks the ability to return home without family support. Will f/u as she remains hospitalized.     Follow Up Recommendations  Follow surgeon's recommendation for DC plan and follow-up therapies;Supervision for mobility/OOB     Equipment Recommendations       Recommendations for Other Services       Precautions / Restrictions Precautions Precautions: Knee Precaution Booklet Issued: No Precaution Comments: Verbally reviewed knee precautions with pt.  Restrictions Weight Bearing Restrictions: Yes RLE Weight Bearing: Weight bearing as tolerated    Mobility  Bed Mobility Overal bed mobility: Needs Assistance Bed Mobility: Sit to Supine       Sit to supine: Supervision   General bed mobility comments: No assistance to return back to bed, cueing for scooting to [facility].  Transfers Overall transfer level: Needs assistance Equipment used: Rolling walker (2 wheeled) Transfers: Sit to/from Stand Sit to Stand: Supervision Stand pivot transfers: Min guard       General transfer comment: Supervision for safety, she remains guarded due to pain.  Ambulation/Gait Ambulation/Gait assistance: Supervision;Min guard Gait Distance (Feet): 200 Feet Assistive device: Rolling walker (2 wheeled) Gait Pattern/deviations: Decreased step length - right;Decreased step length - left;Antalgic;Step-through  pattern;Trunk flexed     General Gait Details: Pt more mobile this pm, she was able to utilize step through sequencing immediatley.  Pt required standing rest break x2 but remains motivated to ambulate 2x daily.   Stairs             Wheelchair Mobility    Modified Rankin (Stroke Patients Only)       Balance Overall balance assessment: Needs assistance Sitting-balance support: No upper extremity supported;Feet supported Sitting balance-Leahy Scale: Good     Standing balance support: During functional activity;Single extremity supported Standing balance-Leahy Scale: Fair Standing balance comment: able to maintain static standing with min guard assist without UE support                             Cognition Arousal/Alertness: Awake/alert Behavior During Therapy: WFL for tasks assessed/performed Overall Cognitive Status: Within Functional Limits for tasks assessed                                        Exercises Total Joint Exercises Ankle Circles/Pumps: AROM;Both;20 reps;Supine Quad Sets: AROM;Right;10 reps;Supine Heel Slides: AAROM;Right;10 reps;Supine Hip ABduction/ADduction: AAROM;Right;10 reps;Supine;AROM Goniometric ROM: 5-85 degrees R knee.    General Comments        Pertinent Vitals/Pain Pain Assessment: Faces Faces Pain Scale: Hurts even more Pain Location:  Rknee Pain Descriptors / Indicators: Grimacing;Guarding Pain Intervention(s): Monitored during session;Repositioned    Home Living  Prior Function            PT Goals (current goals can now be found in the care plan section) Acute Rehab PT Goals Patient Stated Goal: to be able to take care of husband  Potential to Achieve Goals: Good Progress towards PT goals: Progressing toward goals    Frequency    7X/week      PT Plan Current plan remains appropriate    Co-evaluation              AM-PAC PT "6 Clicks" Mobility    Outcome Measure  Help needed turning from your back to your side while in a flat bed without using bedrails?: None Help needed moving from lying on your back to sitting on the side of a flat bed without using bedrails?: A Little Help needed moving to and from a bed to a chair (including a wheelchair)?: A Little Help needed standing up from a chair using your arms (e.g., wheelchair or bedside chair)?: A Little Help needed to walk in hospital room?: A Little Help needed climbing 3-5 steps with a railing? : A Little 6 Click Score: 19    End of Session Equipment Utilized During Treatment: Gait belt Activity Tolerance: Patient tolerated treatment well Patient left: in bed;with call bell/phone within reach;with family/visitor present;with bed alarm set Nurse Communication: Mobility status PT Visit Diagnosis: Muscle weakness (generalized) (M62.81);Unsteadiness on feet (R26.81)     Time: 1540-1600 PT Time Calculation (min) (ACUTE ONLY): 20 min  Charges:  $Gait Training: 8-22 mins                     [name] , PTA Acute Rehabilitation Services Pager [phone] Office [phone]     Cerra Eisenhower Eli Hose 11/19/2019, 4:50 PM

## 2019-11-19 NOTE — Progress Notes (Signed)
Physical Therapy Treatment Patient Details Name: [name] MRN: [id number] DOB: [birth year] Today's Date: 11/19/2019    History of Present Illness Pt is a 64 y/o female s/p R TKA. Pt also with episode of bradycardia during surgery; cardiology on board.  PMH includes HTN, DM, a fib, and CAD.     PT Comments    Pt is a lot more sore this am and required min assistance to move from bed to sitting edge of bed.  She demonstrates step to pattern initally due to pain.  Pt continues to benefit from rehab to progress functional mobility.  She will need to be independent to return home to her caregiver role to her disabled husband. Pt remains very motivated.     Follow Up Recommendations  Follow surgeon's recommendation for DC plan and follow-up therapies;Supervision for mobility/OOB     Equipment Recommendations  None recommended by PT    Recommendations for Other Services       Precautions / Restrictions Precautions Precautions: Knee Precaution Booklet Issued: No Precaution Comments: Verbally reviewed knee precautions with pt.  Restrictions Weight Bearing Restrictions: Yes RLE Weight Bearing: Weight bearing as tolerated    Mobility  Bed Mobility Overal bed mobility: Needs Assistance Bed Mobility: Supine to Sit     Supine to sit: Min assist     General bed mobility comments: Pt required increased assistance this am to mobilize to edge of bed.  Unable to advance RLE without assistance.  Transfers Overall transfer level: Needs assistance Equipment used: Rolling walker (2 wheeled) Transfers: Sit to/from Stand Sit to Stand: Min guard         General transfer comment: Pt slow and guarded with cues for hand placement.  Ambulation/Gait Ambulation/Gait assistance: Supervision;Min guard Gait Distance (Feet): 200 Feet Assistive device: Rolling walker (2 wheeled) Gait Pattern/deviations: Decreased step length - right;Decreased step length - left;Antalgic;Step-through  pattern;Trunk flexed;Step-to pattern Gait velocity: Decreased   General Gait Details: Pt intially required min guard as she was unable to progress to step through this am.  Regression to step to pattern.  Cues for weight shifting and gt symmetry, as gt distance progressed she required supervision and able to demonstrate step through pattern.   Stairs             Wheelchair Mobility    Modified Rankin (Stroke Patients Only)       Balance Overall balance assessment: Needs assistance Sitting-balance support: No upper extremity supported;Feet supported Sitting balance-Leahy Scale: Good       Standing balance-Leahy Scale: Fair Standing balance comment: able to maintain static standing with min guard assist without UE support                             Cognition Arousal/Alertness: Awake/alert Behavior During Therapy: WFL for tasks assessed/performed Overall Cognitive Status: Within Functional Limits for tasks assessed                                        Exercises Total Joint Exercises Ankle Circles/Pumps: AROM;Both;20 reps;Supine Quad Sets: AROM;Right;10 reps;Supine Heel Slides: AAROM;Right;10 reps;Supine Hip ABduction/ADduction: AAROM;Right;10 reps;Supine;AROM Straight Leg Raises: AAROM;Right;10 reps;Supine;AROM    General Comments        Pertinent Vitals/Pain Pain Assessment: Faces Pain Score: 5  Pain Location:  Rknee Pain Descriptors / Indicators: Grimacing;Guarding Pain Intervention(s): Monitored during session;Repositioned;Ice applied  Home Living                      Prior Function            PT Goals (current goals can now be found in the care plan section) Acute Rehab PT Goals Patient Stated Goal: to be able to take care of husband  Potential to Achieve Goals: Good Progress towards PT goals: Progressing toward goals    Frequency    7X/week      PT Plan Current plan remains appropriate     Co-evaluation              AM-PAC PT "6 Clicks" Mobility   Outcome Measure  Help needed turning from your back to your side while in a flat bed without using bedrails?: None Help needed moving from lying on your back to sitting on the side of a flat bed without using bedrails?: A Little Help needed moving to and from a bed to a chair (including a wheelchair)?: A Little Help needed standing up from a chair using your arms (e.g., wheelchair or bedside chair)?: A Little Help needed to walk in hospital room?: A Little Help needed climbing 3-5 steps with a railing? : A Little 6 Click Score: 19    End of Session Equipment Utilized During Treatment: Gait belt Activity Tolerance: Patient tolerated treatment well Patient left: in bed;with call bell/phone within reach;with family/visitor present;with bed alarm set Nurse Communication: Mobility status PT Visit Diagnosis: Muscle weakness (generalized) (M62.81);Unsteadiness on feet (R26.81)     Time: 0940-1005 PT Time Calculation (min) (ACUTE ONLY): 25 min  Charges:  $Gait Training: 8-22 mins $Therapeutic Exercise: 8-22 mins                     [name] , PTA Acute Rehabilitation Services Pager [phone] Office [facility] [facility] 11/19/2019, 10:16 AM

## 2019-11-19 NOTE — Progress Notes (Signed)
Occupational Therapy Treatment Patient Details Name: [name] MRN: [id number] DOB: [birth year] Today's Date: 11/19/2019    History of present illness Pt is a 64 y/o female s/p R TKA. Pt also with episode of bradycardia during surgery; cardiology on board.  PMH includes HTN, DM, a fib, and CAD.    OT comments  Pt presents with increased Rt knee pain today, but able to perform ADLs with min guard assist still.  Reviewed fall prevention strategies with her as well as safety with IADLs and meal prep.    Follow Up Recommendations  Follow surgeon's recommendation for DC plan and follow-up therapies    Equipment Recommendations  None recommended by OT    Recommendations for Other Services      Precautions / Restrictions Precautions Precautions: Knee Precaution Booklet Issued: No Precaution Comments: Verbally reviewed knee precautions with pt.  Restrictions Weight Bearing Restrictions: Yes RLE Weight Bearing: Weight bearing as tolerated       Mobility Bed Mobility Overal bed mobility: Needs Assistance Bed Mobility: Supine to Sit     Supine to sit: Min assist     General bed mobility comments: in recliner   Transfers Overall transfer level: Needs assistance Equipment used: Rolling walker (2 wheeled) Transfers: Sit to/from [facility] Sit to Stand: Min guard Stand pivot transfers: Min guard       General transfer comment: guarded due to pain     Balance Overall balance assessment: Needs assistance Sitting-balance support: No upper extremity supported;Feet supported Sitting balance-Leahy Scale: Good     Standing balance support: During functional activity;Single extremity supported Standing balance-Leahy Scale: Fair Standing balance comment: able to maintain static standing with min guard assist without UE support                            ADL either performed or assessed with clinical judgement   ADL Overall ADL's : Needs  assistance/impaired Eating/Feeding: Independent   Grooming: Wash/dry hands;Wash/dry face;Oral care;Brushing hair;Standing;Supervision/safety       Lower Body Bathing: Min guard;Sit to/from stand       Lower Body Dressing: Min guard;Sit to/from stand   Toilet Transfer: Min guard;Ambulation;Comfort height toilet;Grab bars;RW   Toileting- Water quality scientist and Hygiene: Min guard;Sit to/from stand       Functional mobility during ADLs: Min guard;Rolling walker General ADL Comments: Pt instructed in strategies to reduce risk of falls, use walker bag, and safety with manuevering items around the kitchen or from surface to surface. She was able to return demo with min guard assist      Vision       Perception     Praxis      Cognition Arousal/Alertness: Awake/alert Behavior During Therapy: WFL for tasks assessed/performed Overall Cognitive Status: Within Functional Limits for tasks assessed                                          Exercises Total Joint Exercises Ankle Circles/Pumps: AROM;Both;20 reps;Supine Quad Sets: AROM;Right;10 reps;Supine Heel Slides: AAROM;Right;10 reps;Supine Hip ABduction/ADduction: AAROM;Right;10 reps;Supine;AROM Straight Leg Raises: AAROM;Right;10 reps;Supine;AROM   Shoulder Instructions       General Comments      Pertinent Vitals/ Pain       Pain Assessment: Faces Pain Score: 5  Faces Pain Scale: Hurts even more Pain Location:  Rknee Pain Descriptors / Indicators:  Grimacing;Guarding Pain Intervention(s): Monitored during session;Ice applied;Repositioned  Home Living                                          Prior Functioning/Environment              Frequency  Min 2X/week        Progress Toward Goals  OT Goals(current goals can now be found in the care plan section)  Progress towards OT goals: Progressing toward goals  Acute Rehab OT Goals Patient Stated Goal: to be able to  take care of husband   Plan Discharge plan needs to be updated    Co-evaluation                 AM-PAC OT "6 Clicks" Daily Activity     Outcome Measure   Help from another person eating meals?: None Help from another person taking care of personal grooming?: A Little Help from another person toileting, which includes using toliet, bedpan, or urinal?: A Little Help from another person bathing (including washing, rinsing, drying)?: A Little Help from another person to put on and taking off regular upper body clothing?: A Little Help from another person to put on and taking off regular lower body clothing?: A Little 6 Click Score: 19    End of Session Equipment Utilized During Treatment: Gait belt;Back brace CPM Right Knee CPM Right Knee: Off  OT Visit Diagnosis: Unsteadiness on feet (R26.81);Pain Pain - Right/Left: Left Pain - part of body: Knee   Activity Tolerance Patient tolerated treatment well   Patient Left in chair;with call bell/phone within reach;with chair alarm set   Nurse Communication Mobility status        Time: 2637-8588 OT Time Calculation (min): 20 min  Charges: OT General Charges $OT Visit: 1 Visit OT Treatments $Self Care/Home Management : 8-22 mins  [name] OTR/L Acute Rehabilitation Services Pager [phone] Office [phone]    [name] M 11/19/2019, 1:07 PM

## 2019-11-19 NOTE — Progress Notes (Signed)
   Reviewed tele with Dr. Rayann Heman. She is stable for discharge from a cardiac perspective with follow up as scheduled.   Echo 11/18/2019 with normal EF 55-60%.   Legrand Como [address]" [place], PA-C  11/19/2019 1:07 PM

## 2019-11-19 NOTE — Plan of Care (Signed)

## 2019-11-19 NOTE — NC FL2 (Signed)
[place] MEDICAID FL2 LEVEL OF CARE SCREENING TOOL     IDENTIFICATION  Patient Name: [name] Birthdate: [birth year] Sex: female Admission Date (Current Location): 11/16/2019  [facility] and [place] Number:  Herbalist and Address:  [facility], [place] [address] [place], [place] [address]      Provider Number: [id number]  Attending Physician Name and Address:  Meredith Pel, MD  Relative Name and Phone Number:       Current Level of Care: Hospital Recommended Level of Care: [place] Prior Approval Number:    Date Approved/Denied:   PASRR Number: [id number]  Discharge Plan: SNF    Current Diagnoses: Patient Active Problem List   Diagnosis Date Noted  . S/P total knee arthroplasty, right 11/16/2019  . Pica 10/29/2019  . Surgical counseling visit 10/02/2019  . Blood in urine 07/19/2019  . Hypothyroidism 07/19/2019  . Nausea 07/19/2019  . Sinus bradycardia 07/19/2019  . Vaginal irritation 07/19/2019  . Screening mammogram, encounter for 07/12/2019  . Hypertension associated with diabetes ([place]) 07/11/2018  . Dyslipidemia associated with type 2 diabetes mellitus ([place]) 07/11/2018  . Short bowel syndrome 07/11/2018  . Peripheral autonomic neuropathy due to diabetes mellitus ([place]) 07/11/2018  . Status post left knee replacement 07/11/2018  . Major depression in remission ([place]) 07/07/2018  . Primary osteoarthritis involving multiple joints 07/07/2018  . Coronary artery disease involving native coronary artery of native heart without angina pectoris 07/07/2018  . Paroxysmal atrial fibrillation ([place]) 05/11/2018  . Mild cognitive impairment 04/30/2018  . Generalized abdominal pain 02/05/2018  . Spigelian hernia 02/05/2018  . Venous stasis dermatitis of right lower extremity 01/02/2018  . Genetic testing 11/24/2017  . Family history of colon cancer   . Dyslipidemia 04/15/2017  . Gastroesophageal reflux disease  without esophagitis 04/15/2017  . Umbilical hernia without obstruction and without gangrene 04/15/2017  . B12 deficiency 12/13/2016  . Malabsorption 03/12/2016  . Tubular adenoma of colon 03/09/2016  . Vitamin D deficiency 03/09/2016  . Fecal incontinence 08/02/2015  . Stress 05/24/2015  . S/P bariatric surgery-duodenal switch with sleeve gastrectomy 12/04/2013  . Nonobstructive CAD  08/26/2013  . Iron deficiency anemia 06/19/2012  . Low back pain 03/05/2012  . PSORIASIS 05/28/2010  . HYPERCHOLESTEROLEMIA 03/20/2010  . Peripheral neuropathy 01/12/2009  . Type 2 diabetes, controlled, with neuropathy ([place]) 02/18/2008  . Chronic depression 10/06/2007  . OBSTRUCTIVE SLEEP APNEA 06/12/2007  . GOITER, MULTINODULAR 05/13/2007    Orientation RESPIRATION BLADDER Height & Weight     Self, Time, Situation, Place  Normal Continent Weight: 105.7 kg Height:  _0  (172.7 cm)  BEHAVIORAL SYMPTOMS/MOOD NEUROLOGICAL BOWEL NUTRITION STATUS      Continent Diet(refer to d/c summary)  AMBULATORY STATUS COMMUNICATION OF NEEDS Skin   Extensive Assist Verbally Surgical wounds(RIGHT TOTAL KNEE ARTHROPLASTY,5/26)                       Personal Care Assistance Level of Assistance  Bathing, Feeding, Dressing Bathing Assistance: Maximum assistance Feeding assistance: Independent Dressing Assistance: Maximum assistance     Functional Limitations Info  Sight, Hearing, Speech Sight Info: Adequate Hearing Info: Adequate Speech Info: Adequate    SPECIAL CARE FACTORS FREQUENCY  PT (By licensed PT), OT (By licensed OT)     PT Frequency: 5x per week , evalute and treat OT Frequency: 5x per week , evalute and treat            Contractures Contractures Info:  Not present    Additional Factors Info  Code Status, Allergies Code Status Info: Full code Allergies Info: Cephalexin, Latex, Neomycin-bacitracin Zn-polymyx, Tape           Current Medications (11/19/2019):  This is the current  hospital active medication list Current Facility-Administered Medications  Medication Dose Route Frequency Provider Last Rate Last Admin  . amLODipine (NORVASC) tablet 10 mg  10 mg Oral Daily Magnant, Charles L, PA-C   10 mg at 11/19/19 1033  . apixaban (ELIQUIS) tablet 5 mg  5 mg Oral BID Magnant, Charles L, PA-C   5 mg at 11/19/19 1033  . atorvastatin (LIPITOR) tablet 40 mg  40 mg Oral Daily Magnant, Charles L, PA-C   40 mg at 11/19/19 1033  . celecoxib (CELEBREX) capsule 200 mg  200 mg Oral BID Magnant, Charles L, PA-C   200 mg at 11/19/19 1034  . docusate sodium (COLACE) capsule 100 mg  100 mg Oral BID Magnant, Charles L, PA-C   100 mg at 11/18/19 2128  . gabapentin (NEURONTIN) capsule 100 mg  100 mg Oral BH-q7a Magnant, Charles L, PA-C   100 mg at 11/19/19 1610  . gabapentin (NEURONTIN) capsule 300 mg  300 mg Oral QHS Magnant, Charles L, PA-C   300 mg at 11/18/19 2128  . lisinopril (ZESTRIL) tablet 20 mg  20 mg Oral Daily Meredith Pel, MD   20 mg at 11/19/19 1033   And  . hydrochlorothiazide (HYDRODIURIL) tablet 25 mg  25 mg Oral Daily Meredith Pel, MD   25 mg at 11/19/19 1034  . HYDROmorphone (DILAUDID) injection 0.5 mg  0.5 mg Intravenous Q4H PRN Magnant, Charles L, PA-C      . insulin aspart (novoLOG) injection 0-15 Units  0-15 Units Subcutaneous TID WC Magnant, Charles L, PA-C   3 Units at 11/19/19 1032  . insulin aspart (novoLOG) injection 4 Units  4 Units Subcutaneous TID WC Magnant, Charles L, PA-C   4 Units at 11/18/19 1845  . lactated ringers infusion   Intravenous Continuous Magnant, Gerrianne Scale, PA-C   Stopped at 11/17/19 1850  . menthol-cetylpyridinium (CEPACOL) lozenge 3 mg  1 lozenge Oral PRN Magnant, Charles L, PA-C       Or  . phenol (CHLORASEPTIC) mouth spray 1 spray  1 spray Mouth/Throat PRN Magnant, Charles L, PA-C      . metFORMIN (GLUCOPHAGE) tablet 1,000 mg  1,000 mg Oral Q breakfast Magnant, Charles L, PA-C   1,000 mg at 11/19/19 1033  . methocarbamol  (ROBAXIN) tablet 500 mg  500 mg Oral Q6H PRN Magnant, Charles L, PA-C   500 mg at 11/19/19 0252   Or  . methocarbamol (ROBAXIN) 500 mg in dextrose 5 % 50 mL IVPB  500 mg Intravenous Q6H PRN Magnant, Charles L, PA-C      . metoCLOPramide (REGLAN) tablet 5-10 mg  5-10 mg Oral Q8H PRN Magnant, Charles L, PA-C       Or  . metoCLOPramide (REGLAN) injection 5-10 mg  5-10 mg Intravenous Q8H PRN Magnant, Charles L, PA-C      . ondansetron (ZOFRAN) tablet 4 mg  4 mg Oral Q6H PRN Magnant, Charles L, PA-C       Or  . ondansetron (ZOFRAN) injection 4 mg  4 mg Intravenous Q6H PRN Magnant, Charles L, PA-C   4 mg at 11/17/19 0816  . oxyCODONE (Oxy IR/ROXICODONE) immediate release tablet 5-10 mg  5-10 mg Oral Q4H PRN Magnant, Charles L, PA-C   10  mg at 11/19/19 0252  . sertraline (ZOLOFT) tablet 100 mg  100 mg Oral Daily Magnant, Charles L, PA-C   100 mg at 11/19/19 1034  . Teduglutide (rDNA) KIT 5 mg  5 mg Subcutaneous Daily Magnant, Charles L, PA-C   5 mg at 11/18/19 1843     Discharge Medications: Please see discharge summary for a list of discharge medications.  Relevant Imaging Results:  Relevant Lab Results:   Additional Information SSN: [id number]  Sharin Mons, RN

## 2019-11-19 NOTE — TOC Progression Note (Signed)
Transition of Care [facility]) - Progression Note    Patient Details  Name: [name] MRN: [id number] Date of Birth: Feb 11, 1956  Transition of [facility]) CM/SW Contact  Sharin Mons, RN Phone Number: [phone] 11/19/2019, 1:14 PM  Clinical Narrative:     SNF bed offer extended by [facility] admission , pt accepted. Insurance authorization pending, updated COVID needed prior to d/c. TOC team will continue to monitor and follow ...  Expected Discharge Plan: Skilled Nursing Facility Barriers to Discharge: [place])  Expected Discharge Plan and Services Expected Discharge Plan: [place]   Discharge Planning Services: CM Consult   Living arrangements for the past 2 months: Single Family Home     Social Determinants of Health (SDOH) Interventions    Readmission Risk Interventions No flowsheet data found.

## 2019-11-19 NOTE — Plan of Care (Signed)
  Problem: Health Behavior/Discharge Planning: Goal: Ability to manage health-related needs will improve Outcome: Adequate for Discharge   Problem: Clinical Measurements: Goal: Ability to maintain clinical measurements within normal limits will improve Outcome: Adequate for Discharge Goal: Diagnostic test results will improve Outcome: Adequate for Discharge

## 2019-11-19 NOTE — Progress Notes (Signed)
Patient stable pain controlled patient has been walking in the hallways and did stairs today.  Dressing intact  Plan at this time is discharged to skilled nursing facility.  She is medically ready for discharge.  Okay for discharge to skilled nursing facility tomorrow.

## 2019-11-20 LAB — GLUCOSE, CAPILLARY
Glucose-Capillary: 119 mg/dL — ABNORMAL HIGH (ref 70–99)
Glucose-Capillary: 121 mg/dL — ABNORMAL HIGH (ref 70–99)
Glucose-Capillary: 101 mg/dL — ABNORMAL HIGH (ref 70–99)
Glucose-Capillary: 233 mg/dL — ABNORMAL HIGH (ref 70–99)

## 2019-11-20 LAB — SARS CORONAVIRUS 2 (TAT 6-24 HRS): SARS Coronavirus 2: NEGATIVE

## 2019-11-20 MED ORDER — OXYCODONE HCL 5 MG PO TABS: 5.0000 mg | ORAL_TABLET | ORAL | 0 refills | Status: DC | PRN

## 2019-11-20 MED ORDER — TEDUGLUTIDE (RDNA) 5 MG ~~LOC~~ KIT
4.9000 mg | PACK | SUBCUTANEOUS | Status: DC
  Administered 2019-11-20 – 2019-11-23 (×4): 4.9 mg via SUBCUTANEOUS
  Filled 2019-11-20 (×2): qty 1

## 2019-11-20 MED ORDER — CELECOXIB 200 MG PO CAPS: 200.0000 mg | ORAL_CAPSULE | Freq: Two times a day (BID) | ORAL | 0 refills | Status: DC

## 2019-11-20 MED ORDER — METHOCARBAMOL 500 MG PO TABS: 500.0000 mg | ORAL_TABLET | Freq: Three times a day (TID) | ORAL | 0 refills | Status: DC | PRN

## 2019-11-20 NOTE — TOC Progression Note (Signed)
Transition of Care [facility]) - Progression Note    Patient Details  Name: REGINIA BATTIE MRN: [id number] Date of Birth: Sep 24, 1955  Transition of Care [facility]) CM/SW [place], LCSW Phone Number:[phone] 11/20/2019, 11:26 AM  Clinical Narrative:     CSW called [facility] and was informed that it appears that patient benefits may have ended on 09/21/2017. CSW was notified that when this occurs that it is escalated to a supervisor to verify that eligibility. CSW followed up with Janie at [facility] to ascertain if they had started authorization through regular [facility] and she stated that they did not.  IF patient is managed by regular [employer] they are not open on the weekends.  TOC team will continue to assist with discharge planning needs.    Expected Discharge Plan: Skilled Nursing Facility Barriers to Discharge: [place])  Expected Discharge Plan and Services Expected Discharge Plan: [place]   Discharge Planning Services: CM Consult   Living arrangements for the past 2 months: Single Family Home Expected Discharge Date: 11/20/19                                     Social Determinants of Health (SDOH) Interventions    Readmission Risk Interventions No flowsheet data found.

## 2019-11-20 NOTE — Progress Notes (Signed)
Physical Therapy Treatment Patient Details Name: [name] MRN: [id number] DOB: 1956-01-19 Today's Date: 11/20/2019    History of Present Illness Pt is a 64 y/o female s/p R TKA. Pt also with episode of bradycardia during surgery; cardiology on board.  PMH includes HTN, DM, a fib, and CAD.     PT Comments    Pt is progressing well towards goals. She was pleased that she did not require assistance for LE exercises today. Pt was able to ambulate in halls with step through pattern, slow but steady gait with RW. 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 Will continue to follow acutely.     Follow Up Recommendations  Follow surgeon's recommendation for DC plan and follow-up therapies;Supervision for mobility/OOB     Equipment Recommendations  None recommended by PT    Recommendations for Other Services       Precautions / Restrictions Precautions Precautions: Knee Precaution Booklet Issued: No Precaution Comments: Verbally reviewed knee precautions with pt.  Restrictions Weight Bearing Restrictions: Yes RLE Weight Bearing: Weight bearing as tolerated    Mobility  Bed Mobility Overal bed mobility: Needs Assistance Bed Mobility: Supine to Sit     Supine to sit: Supervision     General bed mobility comments: Increased time and effort with HOB elevated and use of bed rails.  Transfers Overall transfer level: Needs assistance Equipment used: Rolling walker (2 wheeled) Transfers: Sit to/from Stand Sit to Stand: Supervision         General transfer comment: Supervision for safety  Ambulation/Gait Ambulation/Gait assistance: Supervision Gait Distance (Feet): 200 Feet Assistive device: Rolling walker (2 wheeled) Gait Pattern/deviations: Decreased step length - right;Decreased step length - left;Antalgic;Step-through pattern Gait velocity:  Decreased   General Gait Details: Pt able to ambulate with midly antalgic gait. Slow but steady with good step through pattern.    Stairs             Wheelchair Mobility    Modified Rankin (Stroke Patients Only)       Balance Overall balance assessment: Needs assistance Sitting-balance support: No upper extremity supported;Feet supported Sitting balance-Leahy Scale: Good     Standing balance support: During functional activity;Single extremity supported Standing balance-Leahy Scale: Fair Standing balance comment: able to maintain static standing with min guard assist without UE support                             Cognition Arousal/Alertness: Awake/alert Behavior During Therapy: WFL for tasks assessed/performed Overall Cognitive Status: Within Functional Limits for tasks assessed                                        Exercises Total Joint Exercises Ankle Circles/Pumps: AROM;Both;20 reps;Supine Quad Sets: AROM;Right;10 reps;Supine Short Arc Quad: AROM;Right;10 reps;Supine Heel Slides: AROM;Right;10 reps;Supine Hip ABduction/ADduction: AROM;Right;10 reps;Supine Straight Leg Raises: AROM;Right;10 reps;Supine    General Comments        Pertinent Vitals/Pain Pain Assessment: Faces Faces Pain Scale: Hurts little more Pain Location: R knee Pain Descriptors / Indicators: Grimacing;Guarding    Home Living                      Prior Function  PT Goals (current goals can now be found in the care plan section) Acute Rehab PT Goals Patient Stated Goal: to be able to take care of husband  PT Goal Formulation: With patient Time For Goal Achievement: 11/30/19 Potential to Achieve Goals: Good Progress towards PT goals: Progressing toward goals    Frequency    7X/week      PT Plan Current plan remains appropriate    Co-evaluation              AM-PAC PT "6 Clicks" Mobility   Outcome Measure  Help  needed turning from your back to your side while in a flat bed without using bedrails?: None Help needed moving from lying on your back to sitting on the side of a flat bed without using bedrails?: A Little Help needed moving to and from a bed to a chair (including a wheelchair)?: A Little Help needed standing up from a chair using your arms (e.g., wheelchair or bedside chair)?: A Little Help needed to walk in hospital room?: A Little Help needed climbing 3-5 steps with a railing? : A Little 6 Click Score: 19    End of Session Equipment Utilized During Treatment: Gait belt Activity Tolerance: Patient tolerated treatment well Patient left: with call bell/phone within reach;in chair;with chair alarm set Nurse Communication: Mobility status PT Visit Diagnosis: Muscle weakness (generalized) (M62.81);Unsteadiness on feet (R26.81)     Time: 1009-1040 PT Time Calculation (min) (ACUTE ONLY): 31 min  Charges:  $Gait Training: 8-22 mins $Therapeutic Exercise: 8-22 mins                     Benjiman Core, [place] Pager [phone] Acute Rehab    Allena Katz 11/20/2019, 10:49 AM

## 2019-11-20 NOTE — Progress Notes (Signed)
  Subjective: Patient stable.  Progressing well.  Ready for transfer to skilled nursing   Objective: Vital signs in last 24 hours: Temp:  [97.7 F (36.5 C)-98.8 F (37.1 C)] 97.8 F (36.6 C) (05/29 0753) Pulse Rate:  [52-82] 66 (05/29 0854) Resp:  [12-17] 16 (05/29 0854) BP: (121-145)/(55-91) 144/72 (05/29 0854) SpO2:  [91 %-99 %] 98 % (05/29 0854)  Intake/Output from previous day: No intake/output data recorded. Intake/Output this shift: No intake/output data recorded.  Exam:  Dorsiflexion/Plantar flexion intact  Labs: Recent Labs    11/18/19 0850  HGB 9.5*   Recent Labs    11/18/19 0850  WBC 6.5  RBC 3.88  HCT 31.8*  PLT 178   No results for input(s): NA, K, CL, CO2, BUN, CREATININE, GLUCOSE, CALCIUM in the last 72 hours. No results for input(s): LABPT, INR in the last 72 hours.  Assessment/Plan: Plan discharge to [facility] once they have a bed.  Discharge summary completed yesterday by Lurena Joiner.   Landry Dyke Didier Brandenburg 11/20/2019, 9:37 AM

## 2019-11-20 NOTE — Progress Notes (Signed)
Physical Therapy Treatment Patient Details Name: [name] MRN: [id number] DOB: August 02, 1955 Today's Date: 11/20/2019    History of Present Illness Pt is a 64 y/o female s/p R TKA. Pt also with episode of bradycardia during surgery; cardiology on board.  PMH includes HTN, DM, a fib, and CAD.     PT Comments    Pt noticeably feeling poorly on arrival to room. She described it as "foggy, dizzy, swimmy". Vitals stable and pt seemed to improve once she began mobilizing. Pt performed seated HEP, and ambulated in hallway. She was able to toilet w/o assist prior to returning to bed where CPM was applied. Will continue to follow acutely.    Follow Up Recommendations  Follow surgeon's recommendation for DC plan and follow-up therapies;Supervision for mobility/OOB     Equipment Recommendations  None recommended by PT    Recommendations for Other Services       Precautions / Restrictions Precautions Precautions: Knee Precaution Booklet Issued: No Precaution Comments: Verbally reviewed knee precautions with pt.  Restrictions Weight Bearing Restrictions: Yes RLE Weight Bearing: Weight bearing as tolerated    Mobility  Bed Mobility Overal bed mobility: Needs Assistance Bed Mobility: Sit to Supine       Sit to supine: Min assist   General bed mobility comments: Min A for LE management back into bed  Transfers Overall transfer level: Needs assistance Equipment used: Rolling walker (2 wheeled) Transfers: Sit to/from Stand Sit to Stand: Supervision         General transfer comment: Supervision for safety  Ambulation/Gait Ambulation/Gait assistance: Supervision Gait Distance (Feet): 200 Feet Assistive device: Rolling walker (2 wheeled) Gait Pattern/deviations: Decreased step length - right;Decreased step length - left;Antalgic;Step-through pattern Gait velocity: Decreased   General Gait Details: Pt able to ambulate with midly antalgic gait. Slow but steady with good step  through pattern.  Cues for postural control and forward gaze   Stairs             Wheelchair Mobility    Modified Rankin (Stroke Patients Only)       Balance Overall balance assessment: Needs assistance Sitting-balance support: No upper extremity supported;Feet supported Sitting balance-Leahy Scale: Good     Standing balance support: During functional activity;Single extremity supported Standing balance-Leahy Scale: Fair Standing balance comment: able to maintain static standing with min guard assist without UE support                             Cognition Arousal/Alertness: Awake/alert Behavior During Therapy: WFL for tasks assessed/performed Overall Cognitive Status: Within Functional Limits for tasks assessed                                        Exercises Total Joint Exercises Heel Slides: AROM;Right;10 reps;Seated(towel under foot) Long Arc Quad: AROM;Right;5 reps;Seated Knee Flexion: AROM;Right;Seated;5 reps(5 sec holds)    General Comments        Pertinent Vitals/Pain Pain Assessment: Faces Faces Pain Scale: Hurts little more Pain Location: R knee Pain Descriptors / Indicators: Grimacing;Guarding Pain Intervention(s): Monitored during session;Limited activity within patient's tolerance;Repositioned    Home Living                      Prior Function            PT Goals (current goals can now be  found in the care plan section) Acute Rehab PT Goals Patient Stated Goal: to be able to take care of husband  PT Goal Formulation: With patient Time For Goal Achievement: 11/30/19 Potential to Achieve Goals: Good Progress towards PT goals: Progressing toward goals    Frequency    7X/week      PT Plan Current plan remains appropriate    Co-evaluation              AM-PAC PT "6 Clicks" Mobility   Outcome Measure  Help needed turning from your back to your side while in a flat bed without using  bedrails?: None Help needed moving from lying on your back to sitting on the side of a flat bed without using bedrails?: A Little Help needed moving to and from a bed to a chair (including a wheelchair)?: A Little Help needed standing up from a chair using your arms (e.g., wheelchair or bedside chair)?: A Little Help needed to walk in hospital room?: A Little Help needed climbing 3-5 steps with a railing? : A Little 6 Click Score: 19    End of Session Equipment Utilized During Treatment: Gait belt Activity Tolerance: Patient tolerated treatment well Patient left: with call bell/phone within reach;in bed;with nursing/sitter in room Nurse Communication: Mobility status PT Visit Diagnosis: Muscle weakness (generalized) (M62.81);Unsteadiness on feet (R26.81)     Time: 1410-1450 PT Time Calculation (min) (ACUTE ONLY): 40 min  Charges:  $Gait Training: 8-22 mins $Therapeutic Exercise: 8-22 mins $Therapeutic Activity: 8-22 mins                     Benjiman Core, [place] Pager [phone] [place] 11/20/2019, 3:11 PM

## 2019-11-20 NOTE — Progress Notes (Signed)
Patient  Surgical leg is in bone foam now , patient agreed to for CPM to be put on in the morning. Will continue to monitor

## 2019-11-20 NOTE — Plan of Care (Signed)

## 2019-11-21 LAB — GLUCOSE, CAPILLARY
Glucose-Capillary: 235 mg/dL — ABNORMAL HIGH (ref 70–99)
Glucose-Capillary: 88 mg/dL (ref 70–99)
Glucose-Capillary: 96 mg/dL (ref 70–99)
Glucose-Capillary: 170 mg/dL — ABNORMAL HIGH (ref 70–99)

## 2019-11-21 NOTE — TOC Progression Note (Signed)
Transition of Care [facility]) - Progression Note    Patient Details  Name: [name] MRN: [id number] Date of Birth: 04-Aug-1955  Transition of Care [facility]) CM/SW [place], [place] Phone Number: [phone] 11/21/2019, 1:28 PM  Clinical Narrative:    CSW reached out to [facility] and they informed CSW that it did not appear that they managed patient. CSW explained that she had called several times yesterday and was informed that it was escalated to a supervisor. CSW was informed to call regular [employer].  CSW followed up with Narda Rutherford and informed her of the update. She informed CSW that regular [employer] is closed however she would go ahead and start the authorization just in case.  TOC team will continue to follow for discharge planning needs.  Expected Discharge Plan: Skilled Nursing Facility Barriers to Discharge: [place])  Expected Discharge Plan and Services Expected Discharge Plan: [place]   Discharge Planning Services: CM Consult   Living arrangements for the past 2 months: Single Family Home Expected Discharge Date: 11/20/19                                     Social Determinants of Health (SDOH) Interventions    Readmission Risk Interventions No flowsheet data found.

## 2019-11-21 NOTE — Progress Notes (Signed)
  Subjective: [name] is a 64 y.o. female s/p right TKA.  They are POD5.  Pt's pain is controlled.  Pt denies numbness/tingling/weakness.  Pt has ambulated with little difficulty.  She has been walking around the halls in PT and progressively increasing the distance travelled.  Using CPM and blue cradle boot.    Objective: Vital signs in last 24 hours: Temp:  [97.6 F (36.4 C)-98.9 F (37.2 C)] 98.8 F (37.1 C) (05/30 0839) Pulse Rate:  [52-81] 59 (05/30 0839) Resp:  [14-16] 15 (05/30 0839) BP: (125-157)/(55-79) 125/59 (05/30 0839) SpO2:  [95 %-98 %] 98 % (05/30 0839)  Intake/Output from previous day: No intake/output data recorded. Intake/Output this shift: No intake/output data recorded.  Exam:  No gross blood or drainage overlying the dressing 2+ DP pulse Sensation intact distally in the right foot Able to dorsiflex and plantarflex the right foot   Labs: No results for input(s): HGB in the last 72 hours. No results for input(s): WBC, RBC, HCT, PLT in the last 72 hours. No results for input(s): NA, K, CL, CO2, BUN, CREATININE, GLUCOSE, CALCIUM in the last 72 hours. No results for input(s): LABPT, INR in the last 72 hours.  Assessment/Plan: Pt is POD5 s/p right TKA.    -Plan to discharge to SNF in coming days.  Discharge summary completed but without medications, contact me if need updated discharge summary with medications added prior to discharge  -WBAT with a walker    Jadin Creque L Keshun Berrett 11/21/2019, 10:04 AM

## 2019-11-21 NOTE — Progress Notes (Signed)
Physical Therapy Treatment Patient Details Name: [name] MRN: [id number] DOB: 1956-04-07 Today's Date: 11/21/2019    History of Present Illness Pt is a 64 y/o female s/p R TKA. Pt also with episode of bradycardia during surgery; cardiology on board.  PMH includes HTN, DM, a fib, and CAD.     PT Comments    Pt continues to progress. DC as per ortho surgeon.     Follow Up Recommendations  Follow surgeon's recommendation for DC plan and follow-up therapies     Equipment Recommendations  None recommended by PT    Recommendations for Other Services       Precautions / Restrictions Precautions Precautions: Knee Precaution Comments: Discussed use of knee immobilizer with pt and PA Restrictions Weight Bearing Restrictions: Yes RLE Weight Bearing: Weight bearing as tolerated    Mobility  Bed Mobility Overal bed mobility: Needs Assistance Bed Mobility: Supine to Sit     Supine to sit: Supervision     General bed mobility comments: Pt used Knee Immobilzer to help get RLE to EOB  Transfers Overall transfer level: Needs assistance Equipment used: Rolling walker (2 wheeled) Transfers: Sit to/from Stand Sit to Stand: Min assist         General transfer comment: Pt feels transfer is more difficult with knee immobilizer  Ambulation/Gait Ambulation/Gait assistance: Supervision Gait Distance (Feet): 200 Feet Assistive device: Rolling walker (2 wheeled) Gait Pattern/deviations: Decreased step length - right;Decreased step length - left;Antalgic;Step-through pattern Gait velocity: Decreased   General Gait Details: no balance losses. Needed cues to look up. good sequencing   Stairs             Wheelchair Mobility    Modified Rankin (Stroke Patients Only)       Balance     Sitting balance-Leahy Scale: Good                                      Cognition Arousal/Alertness: Awake/alert Behavior During Therapy: WFL for tasks  assessed/performed Overall Cognitive Status: Within Functional Limits for tasks assessed                                        Exercises Total Joint Exercises Ankle Circles/Pumps: AROM;Both;5 reps;Supine Quad Sets: AROM;Supine(pt advised to do these on her own as she can do independentl) Heel Slides: AROM;Right;10 reps;Supine(towel under foot) Hip ABduction/ADduction: Right;10 reps;Supine;AAROM Straight Leg Raises: AAROM;Right;10 reps;Supine Long Arc Quad: Right;Seated;10 reps;AAROM Knee Flexion: Right;Seated;AAROM(5 sec holds) Goniometric ROM: 84 degrees flexion in sitting    General Comments General comments (skin integrity, edema, etc.): No family present.       Pertinent Vitals/Pain Pain Assessment: 0-10 Pain Score: 4  Pain Location: R knee Pain Descriptors / Indicators: Grimacing;Guarding Pain Intervention(s): Limited activity within patient's tolerance;Monitored during session;Premedicated before session    Home Living                      Prior Function            PT Goals (current goals can now be found in the care plan section) Acute Rehab PT Goals Patient Stated Goal: to be able to take care of husband  Progress towards PT goals: Progressing toward goals    Frequency    7X/week      PT  Plan Current plan remains appropriate    Co-evaluation              AM-PAC PT "6 Clicks" Mobility   Outcome Measure  Help needed turning from your back to your side while in a flat bed without using bedrails?: None Help needed moving from lying on your back to sitting on the side of a flat bed without using bedrails?: A Little Help needed moving to and from a bed to a chair (including a wheelchair)?: A Little Help needed standing up from a chair using your arms (e.g., wheelchair or bedside chair)?: A Little Help needed to walk in hospital room?: A Little Help needed climbing 3-5 steps with a railing? : A Little 6 Click Score: 19     End of Session Equipment Utilized During Treatment: Gait belt Activity Tolerance: Patient tolerated treatment well Patient left: with call bell/phone within reach;in bed;with chair alarm set   PT Visit Diagnosis: Muscle weakness (generalized) (M62.81);Unsteadiness on feet (R26.81)     Time: 1001-1031 PT Time Calculation (min) (ACUTE ONLY): 30 min  Charges:  $Gait Training: 8-22 mins $Therapeutic Exercise: 8-22 mins                     [name], PT   Acute Rehabilitation Services  Pager [phone] Office [phone] 11/21/2019    [name] 11/21/2019, 10:50 AM

## 2019-11-21 NOTE — Plan of Care (Signed)

## 2019-11-21 NOTE — Plan of Care (Addendum)
Placed pt on CPM for 2 hours after dinner. Encouraged pt to be place in CPM this morning, but pt wants to wait until after she eats breakfast. Knee immobilizer placed on pt when off CPM.   Problem: Education: Goal: Knowledge of General Education information will improve Description: Including pain rating scale, medication(s)/side effects and non-pharmacologic comfort measures Outcome: Progressing   Problem: Activity: Goal: Risk for activity intolerance will decrease Outcome: Progressing   Problem: Pain Managment: Goal: General experience of comfort will improve Outcome: Progressing   Problem: Safety: Goal: Ability to remain free from injury will improve Outcome: Progressing   Problem: Skin Integrity: Goal: Risk for impaired skin integrity will decrease Outcome: Progressing

## 2019-11-21 NOTE — Progress Notes (Signed)
Physical Therapy Treatment Patient Details Name: [name] MRN: [id number] DOB: 06-02-1956 Today's Date: 11/21/2019    History of Present Illness Pt is a 64 y/o female s/p R TKA. Pt also with episode of bradycardia during surgery; cardiology on board.  PMH includes HTN, DM, a fib, and CAD.     PT Comments    Focus of pm session was on exercises, specifically quad strengthening.    Follow Up Recommendations  Follow surgeon's recommendation for DC plan and follow-up therapies     Equipment Recommendations  None recommended by PT    Recommendations for Other Services       Precautions / Restrictions Precautions Precautions: Knee Restrictions Weight Bearing Restrictions: Yes RLE Weight Bearing: Weight bearing as tolerated    Mobility  Bed Mobility Overal bed mobility: Needs Assistance Bed Mobility: Sit to Supine       Sit to supine: Supervision   General bed mobility comments: (pt hooked left leg under right to bring into bed)  Transfers Overall transfer level: Needs assistance Equipment used: Rolling walker (2 wheeled) Transfers: Sit to/from Stand Sit to Stand: Supervision Stand pivot transfers: Supervision          Ambulation/Gait                 Stairs             Wheelchair Mobility    Modified Rankin (Stroke Patients Only)       Balance                                            Cognition Arousal/Alertness: Awake/alert Behavior During Therapy: WFL for tasks assessed/performed Overall Cognitive Status: Within Functional Limits for tasks assessed                                        Exercises Total Joint Exercises Short Arc Quad: AROM;Right;10 reps;Supine Straight Leg Raises: AAROM;Right;10 reps;Supine Long Arc Quad: AROM;Right;10 reps;Seated    General Comments        Pertinent Vitals/Pain Pain Assessment: 0-10 Pain Score: 4  Pain Location: R knee Pain Intervention(s): Limited  activity within patient's tolerance;Monitored during session    Home Living                      Prior Function            PT Goals (current goals can now be found in the care plan section) Progress towards PT goals: Progressing toward goals    Frequency    7X/week      PT Plan Current plan remains appropriate    Co-evaluation              AM-PAC PT "6 Clicks" Mobility   Outcome Measure  Help needed turning from your back to your side while in a flat bed without using bedrails?: None Help needed moving from lying on your back to sitting on the side of a flat bed without using bedrails?: A Little Help needed moving to and from a bed to a chair (including a wheelchair)?: A Little Help needed standing up from a chair using your arms (e.g., wheelchair or bedside chair)?: A Little Help needed to walk in hospital room?: A Little Help needed climbing 3-5 steps  with a railing? : A Little 6 Click Score: 19    End of Session   Activity Tolerance: Patient tolerated treatment well Patient left: in bed;in CPM;with call bell/phone within reach;with bed alarm set   PT Visit Diagnosis: Muscle weakness (generalized) (M62.81);Unsteadiness on feet (R26.81)     Time: 1345-1411 PT Time Calculation (min) (ACUTE ONLY): 26 min  Charges:  $Therapeutic Exercise: 23-37 mins                     Lavonia Dana, PT   Acute Rehabilitation Services  Pager [phone] Office [phone] 11/21/2019    Melvern Banker 11/21/2019, 3:06 PM

## 2019-11-22 LAB — GLUCOSE, CAPILLARY
Glucose-Capillary: 132 mg/dL — ABNORMAL HIGH (ref 70–99)
Glucose-Capillary: 136 mg/dL — ABNORMAL HIGH (ref 70–99)
Glucose-Capillary: 90 mg/dL (ref 70–99)
Glucose-Capillary: 167 mg/dL — ABNORMAL HIGH (ref 70–99)

## 2019-11-22 NOTE — Progress Notes (Signed)
Physical Therapy Treatment Patient Details Name: [name] MRN: [id number] DOB: 1955/12/08 Today's Date: 11/22/2019    History of Present Illness Pt is a 64 y/o female s/p R TKA. Pt also with episode of bradycardia during surgery; cardiology on board.  PMH includes HTN, DM, a fib, and CAD.     PT Comments    Pt is making great gains.  Able to perform x 10 straight leg raises with out assistance.  Minor extensor lag noted but much improved.  Continue to follow recommendations per surgeon.      Follow Up Recommendations  Follow surgeon's recommendation for DC plan and follow-up therapies     Equipment Recommendations  None recommended by PT    Recommendations for Other Services       Precautions / Restrictions Precautions Precautions: Knee Precaution Comments: reviwed knee precautions and resting in supported extension Restrictions Weight Bearing Restrictions: Yes RLE Weight Bearing: Weight bearing as tolerated    Mobility  Bed Mobility Overal bed mobility: Needs Assistance Bed Mobility: Supine to Sit     Supine to sit: Modified independent (Device/Increase time)     General bed mobility comments: No assistance to move to edge of bed.  Transfers Overall transfer level: Needs assistance Equipment used: Rolling walker (2 wheeled) Transfers: Sit to/from Stand Sit to Stand: Modified independent (Device/Increase time)         General transfer comment: No assistance to move to standing with good safety.  Ambulation/Gait Ambulation/Gait assistance: Supervision Gait Distance (Feet): 250 Feet Assistive device: Rolling walker (2 wheeled) Gait Pattern/deviations: Step-through pattern;Trunk flexed Gait velocity: Decreased   General Gait Details: Cues for scap retraction and upper trunk control.  Pt cueing for gt symmetry and equal weight shifting.   Stairs             Wheelchair Mobility    Modified Rankin (Stroke Patients Only)       Balance  Overall balance assessment: Needs assistance Sitting-balance support: No upper extremity supported;Feet supported Sitting balance-Leahy Scale: Good       Standing balance-Leahy Scale: Fair                              Cognition Arousal/Alertness: Awake/alert Behavior During Therapy: WFL for tasks assessed/performed Overall Cognitive Status: Within Functional Limits for tasks assessed                                        Exercises Total Joint Exercises Ankle Circles/Pumps: AROM;Both;20 reps;Supine Quad Sets: AROM;Right;10 reps;Supine Heel Slides: AROM;Right;10 reps;Supine Hip ABduction/ADduction: AROM;Right;10 reps;Supine Straight Leg Raises: AROM;Right;10 reps;Supine    General Comments        Pertinent Vitals/Pain Pain Assessment: 0-10 Pain Score: 3  Pain Location: R knee Pain Descriptors / Indicators: Guarding;Sore Pain Intervention(s): Monitored during session;Repositioned;Ice applied    Home Living                      Prior Function            PT Goals (current goals can now be found in the care plan section) Acute Rehab PT Goals Patient Stated Goal: to be able to take care of husband  Potential to Achieve Goals: Good Progress towards PT goals: Progressing toward goals    Frequency    7X/week      PT Plan  Current plan remains appropriate    Co-evaluation              AM-PAC PT "6 Clicks" Mobility   Outcome Measure  Help needed turning from your back to your side while in a flat bed without using bedrails?: None Help needed moving from lying on your back to sitting on the side of a flat bed without using bedrails?: None Help needed moving to and from a bed to a chair (including a wheelchair)?: None Help needed standing up from a chair using your arms (e.g., wheelchair or bedside chair)?: None Help needed to walk in hospital room?: A Little Help needed climbing 3-5 steps with a railing? : A Little 6  Click Score: 22    End of Session Equipment Utilized During Treatment: Gait belt Activity Tolerance: Patient tolerated treatment well Patient left: in chair;with chair alarm set;with call bell/phone within reach Nurse Communication: Mobility status PT Visit Diagnosis: Muscle weakness (generalized) (M62.81);Unsteadiness on feet (R26.81)     Time: 1015-1040 PT Time Calculation (min) (ACUTE ONLY): 25 min  Charges:  $Gait Training: 8-22 mins $Therapeutic Exercise: 8-22 mins                     [name] , PTA Acute Rehabilitation Services Pager [phone] Office [phone]     Lashe Oliveira Eli Hose 11/22/2019, 10:47 AM

## 2019-11-22 NOTE — Plan of Care (Addendum)
Pt placed on CPM for 2 hours to start with, then when going to remove it pt said she would like to continue.  Pt wants to go ahead and do 3 hours more hours instead of doing it for the morning because she is comfortable in the CPM machine. Pt is stable and doing well.   Problem: Education: Goal: Knowledge of General Education information will improve Description: Including pain rating scale, medication(s)/side effects and non-pharmacologic comfort measures Outcome: Progressing   Problem: Activity: Goal: Risk for activity intolerance will decrease Outcome: Progressing   Problem: Pain Managment: Goal: General experience of comfort will improve Outcome: Progressing   Problem: Safety: Goal: Ability to remain free from injury will improve Outcome: Progressing   Problem: Skin Integrity: Goal: Risk for impaired skin integrity will decrease Outcome: Progressing

## 2019-11-22 NOTE — Plan of Care (Addendum)
Pt placed in CPM from 2115 to [place]. Encouraged to use it again in the morning, but pt asked to wait until after breakfast. Will pass on to day shift RN Pt placed in knee immobilizer when not in CPM along with ice to reduce swelling. Will continue to monitor.   Problem: Education: Goal: Knowledge of General Education information will improve Description: Including pain rating scale, medication(s)/side effects and non-pharmacologic comfort measures Outcome: Progressing   Problem: Activity: Goal: Risk for activity intolerance will decrease Outcome: Progressing   Problem: Pain Managment: Goal: General experience of comfort will improve Outcome: Progressing   Problem: Safety: Goal: Ability to remain free from injury will improve Outcome: Progressing   Problem: Skin Integrity: Goal: Risk for impaired skin integrity will decrease Outcome: Progressing

## 2019-11-22 NOTE — Progress Notes (Signed)
Physical Therapy Treatment Patient Details Name: [name] MRN: [id number] DOB: 09-17-1955 Today's Date: 11/22/2019    History of Present Illness Pt is a 64 y/o female s/p R TKA. Pt also with episode of bradycardia during surgery; cardiology on board.  PMH includes HTN, DM, a fib, and CAD.     PT Comments    Pt continues to progress, trialed gt without device x 80 ft with min assistance +1.  Continue to follow surgeons recommendations.    Follow Up Recommendations  Follow surgeon's recommendation for DC plan and follow-up therapies     Equipment Recommendations  None recommended by PT    Recommendations for Other Services       Precautions / Restrictions Precautions Precautions: Knee Precaution Booklet Issued: No Precaution Comments: reviwed knee precautions and resting in supported extension Restrictions Weight Bearing Restrictions: Yes RLE Weight Bearing: Weight bearing as tolerated    Mobility  Bed Mobility Overal bed mobility: Needs Assistance Bed Mobility: Supine to Sit;Sit to Supine     Supine to sit: Modified independent (Device/Increase time) Sit to supine: Modified independent (Device/Increase time)   General bed mobility comments: No assistance to move to edge of bed or back into bed.  Transfers Overall transfer level: Modified independent Equipment used: Rolling walker (2 wheeled)   Sit to Stand: Modified independent (Device/Increase time)         General transfer comment: No assistance to move to standing with good safety.  Ambulation/Gait Ambulation/Gait assistance: Supervision;Min assist Gait Distance (Feet): 200 Feet(ft with RW, additional 80 ft without device.) Assistive device: Rolling walker (2 wheeled);None Gait Pattern/deviations: Step-through pattern;Trunk flexed;Antalgic     General Gait Details: Cues for scap retraction and upper trunk control.  Pt cueing for gt symmetry and equal weight shifting. Trial 80 ft without device, poor  weight shifting noted with cues for reciprocal armswing. Min assistance to maintain balance without device,   Stairs             Wheelchair Mobility    Modified Rankin (Stroke Patients Only)       Balance Overall balance assessment: Needs assistance Sitting-balance support: No upper extremity supported;Feet supported Sitting balance-Leahy Scale: Good       Standing balance-Leahy Scale: Fair                              Cognition Arousal/Alertness: Awake/alert Behavior During Therapy: WFL for tasks assessed/performed Overall Cognitive Status: Within Functional Limits for tasks assessed                                        Exercises Total Joint Exercises Ankle Circles/Pumps: AROM;Both;20 reps;Supine Quad Sets: AROM;Right;10 reps;Supine Short Arc Quad: AROM;Right;10 reps;Supine Heel Slides: AROM;Right;10 reps;Supine Hip ABduction/ADduction: AROM;Right;10 reps;Supine Straight Leg Raises: AROM;Right;10 reps;Supine Goniometric ROM: 6-84 degrees flexion in R knee sitting.    General Comments        Pertinent Vitals/Pain      Home Living                      Prior Function            PT Goals (current goals can now be found in the care plan section) Acute Rehab PT Goals Patient Stated Goal: to be able to take care of husband  Potential to Achieve Goals: Good  Progress towards PT goals: Progressing toward goals    Frequency    7X/week      PT Plan Current plan remains appropriate    Co-evaluation              AM-PAC PT "6 Clicks" Mobility   Outcome Measure  Help needed turning from your back to your side while in a flat bed without using bedrails?: None Help needed moving from lying on your back to sitting on the side of a flat bed without using bedrails?: None Help needed moving to and from a bed to a chair (including a wheelchair)?: None Help needed standing up from a chair using your arms (e.g.,  wheelchair or bedside chair)?: None Help needed to walk in hospital room?: A Little Help needed climbing 3-5 steps with a railing? : A Little 6 Click Score: 22    End of Session Equipment Utilized During Treatment: Gait belt Activity Tolerance: Patient tolerated treatment well Patient left: in chair;with chair alarm set;with call bell/phone within reach Nurse Communication: Mobility status PT Visit Diagnosis: Muscle weakness (generalized) (M62.81);Unsteadiness on feet (R26.81)     Time: 6269-4854 PT Time Calculation (min) (ACUTE ONLY): 24 min  Charges:  $Gait Training: 8-22 mins $Therapeutic Exercise: 8-22 mins                     [name] , PTA Acute Rehabilitation Services Pager [phone] Office [phone]     Nandika Stetzer Eli Hose 11/22/2019, 6:27 PM

## 2019-11-22 NOTE — Progress Notes (Signed)
     Subjective: 6 Days Post-Op Procedure(s) (LRB): RIGHT TOTAL KNEE ARTHROPLASTY-CEMENTED (Right) Somulent, but awakes easily. Right leg with mild swelling.  Patient reports pain as moderate.    Objective:   VITALS:  Temp:  [97.6 F (36.4 C)-98.1 F (36.7 C)] 98 F (36.7 C) (05/31 0803) Pulse Rate:  [58-77] 73 (05/31 0803) Resp:  [13-16] 16 (05/31 0803) BP: (138-146)/(57-77) 146/77 (05/31 0912) SpO2:  [95 %-97 %] 97 % (05/31 0803)  Neurologically intact ABD soft Neurovascular intact Sensation intact distally Intact pulses distally Dorsiflexion/Plantar flexion intact Incision: dressing C/D/I and no drainage No cellulitis present Compartment soft   LABS No results for input(s): HGB, WBC, PLT in the last 72 hours. No results for input(s): NA, K, CL, CO2, BUN, CREATININE, GLUCOSE in the last 72 hours. No results for input(s): LABPT, INR in the last 72 hours.   Assessment/Plan: 6 Days Post-Op Procedure(s) (LRB): RIGHT TOTAL KNEE ARTHROPLASTY-CEMENTED (Right)  Advance diet Up with therapy Discharge to SNF  Basil Dess 11/22/2019, 12:55 PM Patient ID: [name], female   DOB: [birth year], 64 y.o.   MRN: [id number]

## 2019-11-22 NOTE — Care Management Important Message (Signed)
Important Message  Patient Details  Name: [name] MRN: [id number] Date of Birth: 26-Jul-1955   Medicare Important Message Given:  Yes - Important Message mailed due to current National Emergency  Verbal consent obtained due to current National Emergency  Relationship to patient: Child Contact Name: Teandra Harlan Call Date: 11/22/19  Time: 1351 Phone: [phone] Outcome: Spoke with contact Important Message mailed to: Other (must enter comment)(Per patients daughter additional copy of IM not needed)    [place] 11/22/2019, 1:51 PM

## 2019-11-22 NOTE — Plan of Care (Signed)

## 2019-11-23 ENCOUNTER — Encounter: Payer: Self-pay | Admitting: Family Medicine

## 2019-11-23 ENCOUNTER — Telehealth: Payer: Self-pay | Admitting: Orthopedic Surgery

## 2019-11-23 DIAGNOSIS — E119 Type 2 diabetes mellitus without complications: Secondary | ICD-10-CM | POA: Diagnosis not present

## 2019-11-23 DIAGNOSIS — D509 Iron deficiency anemia, unspecified: Secondary | ICD-10-CM | POA: Diagnosis not present

## 2019-11-23 DIAGNOSIS — R2689 Other abnormalities of gait and mobility: Secondary | ICD-10-CM | POA: Diagnosis not present

## 2019-11-23 DIAGNOSIS — Z96659 Presence of unspecified artificial knee joint: Secondary | ICD-10-CM | POA: Diagnosis not present

## 2019-11-23 DIAGNOSIS — E039 Hypothyroidism, unspecified: Secondary | ICD-10-CM | POA: Diagnosis not present

## 2019-11-23 DIAGNOSIS — E782 Mixed hyperlipidemia: Secondary | ICD-10-CM | POA: Diagnosis not present

## 2019-11-23 DIAGNOSIS — G63 Polyneuropathy in diseases classified elsewhere: Secondary | ICD-10-CM | POA: Diagnosis not present

## 2019-11-23 DIAGNOSIS — E1159 Type 2 diabetes mellitus with other circulatory complications: Secondary | ICD-10-CM | POA: Diagnosis not present

## 2019-11-23 DIAGNOSIS — F320 Major depressive disorder, single episode, mild: Secondary | ICD-10-CM | POA: Diagnosis not present

## 2019-11-23 DIAGNOSIS — E1165 Type 2 diabetes mellitus with hyperglycemia: Secondary | ICD-10-CM | POA: Diagnosis not present

## 2019-11-23 DIAGNOSIS — M1711 Unilateral primary osteoarthritis, right knee: Secondary | ICD-10-CM | POA: Diagnosis not present

## 2019-11-23 DIAGNOSIS — I10 Essential (primary) hypertension: Secondary | ICD-10-CM | POA: Diagnosis not present

## 2019-11-23 DIAGNOSIS — Z03818 Encounter for observation for suspected exposure to other biological agents ruled out: Secondary | ICD-10-CM | POA: Diagnosis not present

## 2019-11-23 DIAGNOSIS — R2681 Unsteadiness on feet: Secondary | ICD-10-CM | POA: Diagnosis not present

## 2019-11-23 DIAGNOSIS — I480 Paroxysmal atrial fibrillation: Secondary | ICD-10-CM | POA: Diagnosis not present

## 2019-11-23 DIAGNOSIS — Z20828 Contact with and (suspected) exposure to other viral communicable diseases: Secondary | ICD-10-CM | POA: Diagnosis not present

## 2019-11-23 DIAGNOSIS — Z96651 Presence of right artificial knee joint: Secondary | ICD-10-CM | POA: Diagnosis not present

## 2019-11-23 DIAGNOSIS — E785 Hyperlipidemia, unspecified: Secondary | ICD-10-CM | POA: Diagnosis not present

## 2019-11-23 DIAGNOSIS — I2510 Atherosclerotic heart disease of native coronary artery without angina pectoris: Secondary | ICD-10-CM | POA: Diagnosis not present

## 2019-11-23 DIAGNOSIS — K912 Postsurgical malabsorption, not elsewhere classified: Secondary | ICD-10-CM | POA: Diagnosis not present

## 2019-11-23 DIAGNOSIS — M6281 Muscle weakness (generalized): Secondary | ICD-10-CM | POA: Diagnosis not present

## 2019-11-23 LAB — CBC WITH DIFFERENTIAL/PLATELET
Abs Immature Granulocytes: 0.05 10*3/uL (ref 0.00–0.07)
Basophils Absolute: 0.1 10*3/uL (ref 0.0–0.1)
Basophils Relative: 1 %
Eosinophils Absolute: 0.4 10*3/uL (ref 0.0–0.5)
Eosinophils Relative: 6 %
HCT: 31.5 % — ABNORMAL LOW (ref 36.0–46.0)
Hemoglobin: 9.5 g/dL — ABNORMAL LOW (ref 12.0–15.0)
Immature Granulocytes: 1 %
Lymphocytes Relative: 21 %
Lymphs Abs: 1.4 10*3/uL (ref 0.7–4.0)
MCH: 25.1 pg — ABNORMAL LOW (ref 26.0–34.0)
MCHC: 30.2 g/dL (ref 30.0–36.0)
MCV: 83.3 fL (ref 80.0–100.0)
Monocytes Absolute: 0.6 10*3/uL (ref 0.1–1.0)
Monocytes Relative: 9 %
Neutro Abs: 4.2 10*3/uL (ref 1.7–7.7)
Neutrophils Relative %: 62 %
Platelets: 274 10*3/uL (ref 150–400)
RBC: 3.78 MIL/uL — ABNORMAL LOW (ref 3.87–5.11)
RDW: 15.7 % — ABNORMAL HIGH (ref 11.5–15.5)
WBC: 6.7 10*3/uL (ref 4.0–10.5)
nRBC: 0 % (ref 0.0–0.2)

## 2019-11-23 LAB — BASIC METABOLIC PANEL
Anion gap: 8 (ref 5–15)
BUN: 22 mg/dL (ref 8–23)
CO2: 27 mmol/L (ref 22–32)
Calcium: 10.8 mg/dL — ABNORMAL HIGH (ref 8.9–10.3)
Chloride: 103 mmol/L (ref 98–111)
Creatinine, Ser: 1.03 mg/dL — ABNORMAL HIGH (ref 0.44–1.00)
GFR calc Af Amer: >60 mL/min (ref >60)
GFR calc non Af Amer: 57 mL/min — ABNORMAL LOW (ref >60)
Glucose, Bld: 174 mg/dL — ABNORMAL HIGH (ref 70–99)
Potassium: 3.4 mmol/L — ABNORMAL LOW (ref 3.5–5.1)
Sodium: 138 mmol/L (ref 135–145)

## 2019-11-23 LAB — GLUCOSE, CAPILLARY
Glucose-Capillary: 168 mg/dL — ABNORMAL HIGH (ref 70–99)
Glucose-Capillary: 104 mg/dL — ABNORMAL HIGH (ref 70–99)

## 2019-11-23 MED ORDER — OXYCODONE HCL 5 MG PO TABS: 5.0000 mg | ORAL_TABLET | ORAL | 0 refills | Status: DC | PRN

## 2019-11-23 NOTE — Care Management Important Message (Signed)
Important Message  Patient Details  Name: [name] MRN: [id number] Date of Birth: 09/08/1955   Medicare Important Message Given:  Yes     Orbie Pyo 11/23/2019, 3:26 PM

## 2019-11-23 NOTE — Progress Notes (Signed)
Occupational Therapy Treatment Patient Details Name: [name] MRN: [id number] DOB: [birth year] Today's Date: 11/23/2019    History of present illness Pt is a 64 y/o female s/p R TKA. Pt also with episode of bradycardia during surgery; cardiology on board.  PMH includes HTN, DM, a fib, and CAD.    OT comments  Pt progressing toward established OT goals. Pt currently requires supervision for grooming at sink level and supervision for functional mobility at RW level. She reports she is the primary caregiver for her husband and will need to be able to provide him with physical assistance upon returning home. Pt will continue to benefit from skilled OT services to maximize safety and independence with ADL/IADL and functional mobility. Will continue to follow acutely and progress as tolerated.    Follow Up Recommendations  Follow surgeon's recommendation for DC plan and follow-up therapies    Equipment Recommendations  None recommended by OT    Recommendations for Other Services      Precautions / Restrictions Precautions Precautions: Knee Precaution Booklet Issued: No Precaution Comments: reviwed knee precautions and resting in supported extension Restrictions Weight Bearing Restrictions: Yes RLE Weight Bearing: Weight bearing as tolerated       Mobility Bed Mobility Overal bed mobility: Modified Independent Bed Mobility: Supine to Sit     Supine to sit: Modified independent (Device/Increase time)     General bed mobility comments: pt sitting in recliner upon arrival  Transfers Overall transfer level: Needs assistance Equipment used: Rolling walker (2 wheeled) Transfers: Sit to/from Stand Sit to Stand: Supervision         General transfer comment: supervision for safety    Balance Overall balance assessment: Needs assistance Sitting-balance support: No upper extremity supported;Feet supported Sitting balance-Leahy Scale: Good     Standing balance support: No  upper extremity supported;During functional activity Standing balance-Leahy Scale: Fair Standing balance comment: able to stand without UE support, demonstrated preference for UE support during mobility for increased safety and stability                           ADL either performed or assessed with clinical judgement   ADL Overall ADL's : Needs assistance/impaired Eating/Feeding: Independent   Grooming: Wash/dry hands;Standing;Supervision/safety   Upper Body Bathing: Sitting;Supervision/ safety   Lower Body Bathing: Sit to/from stand;Supervison/ safety   Upper Body Dressing : Sitting;Supervision/safety   Lower Body Dressing: Sit to/from stand;Supervision/safety Lower Body Dressing Details (indicate cue type and reason): pt donned socks sitting in recliner Toilet Transfer: Supervision/safety;RW   Toileting- Clothing Manipulation and Hygiene: Sit to/from stand;Supervision/safety       Functional mobility during ADLs: Rolling walker;Supervision/safety General ADL Comments: continued to reinforce fall prevention strateiges. supervision for safety     Vision       Perception     Praxis      Cognition Arousal/Alertness: Awake/alert Behavior During Therapy: WFL for tasks assessed/performed Overall Cognitive Status: Within Functional Limits for tasks assessed                                          Exercises Total Joint Exercises Ankle Circles/Pumps: AROM;Both;20 reps;Supine Quad Sets: AROM;Right;10 reps;Supine Heel Slides: AROM;Right;10 reps;Supine Hip ABduction/ADduction: AROM;Right;10 reps;Supine Straight Leg Raises: AROM;Right;10 reps;Supine Goniometric ROM: 5-88 degrees flexion in R knee.   Shoulder Instructions       General  Comments      Pertinent Vitals/ Pain       Pain Assessment: Faces Faces Pain Scale: Hurts a little bit Pain Location: R knee Pain Descriptors / Indicators: Guarding;Sore Pain Intervention(s): Monitored  during session;Limited activity within patient's tolerance  Home Living                                          Prior Functioning/Environment              Frequency  Min 2X/week        Progress Toward Goals  OT Goals(current goals can now be found in the care plan section)  Progress towards OT goals: Progressing toward goals  Acute Rehab OT Goals Patient Stated Goal: to be able to take care of husband  OT Goal Formulation: With patient Time For Goal Achievement: 12/02/19 Potential to Achieve Goals: Good ADL Goals Pt Will Perform Grooming: with modified independence;standing Pt Will Perform Upper Body Bathing: with modified independence;standing;sitting Pt Will Perform Lower Body Bathing: with modified independence;sit to/from stand Pt Will Perform Upper Body Dressing: with modified independence;sitting Pt Will Perform Lower Body Dressing: with modified independence;sit to/from stand Pt Will Transfer to Toilet: with modified independence;ambulating;regular height toilet;bedside commode;grab bars Pt Will Perform Toileting - Clothing Manipulation and hygiene: with modified independence;sit to/from stand  Plan Discharge plan needs to be updated    Co-evaluation                 AM-PAC OT "6 Clicks" Daily Activity     Outcome Measure   Help from another person eating meals?: None Help from another person taking care of personal grooming?: A Little Help from another person toileting, which includes using toliet, bedpan, or urinal?: A Little Help from another person bathing (including washing, rinsing, drying)?: A Little Help from another person to put on and taking off regular upper body clothing?: A Little Help from another person to put on and taking off regular lower body clothing?: A Little 6 Click Score: 19    End of Session Equipment Utilized During Treatment: Gait belt;Rolling walker  OT Visit Diagnosis: Unsteadiness on feet  (R26.81);Pain Pain - Right/Left: Left Pain - part of body: Knee   Activity Tolerance Patient tolerated treatment well   Patient Left in chair;with call bell/phone within reach;with chair alarm set   Nurse Communication Mobility status        Time: 4270-6237 OT Time Calculation (min): 14 min  Charges: OT General Charges $OT Visit: 1 Visit OT Treatments $Self Care/Home Management : 8-22 mins  Helene Kelp OTR/L Acute Rehabilitation Services Office: [phone]    Wyn Forster 11/23/2019, 11:56 AM

## 2019-11-23 NOTE — Telephone Encounter (Signed)
[name] with [facility] called stating she needs for someone to update the discharge reconciliation  to be dated  11/23/19.   [name] CB# [phone]

## 2019-11-23 NOTE — Telephone Encounter (Signed)
I was notified that this would need to be a full discharge summary dated for today. Sheri said any questions call/text her. Patient is a bundle patient.

## 2019-11-23 NOTE — TOC Transition Note (Signed)
Transition of Care [facility]) - CM/SW Discharge Note   Patient Details  Name: [name] MRN: [id number] Date of Birth: 1956-02-06  [facility] CM/SW Contact:  Sharin Mons, RN Phone Number: 11/23/2019, 1:23 PM   Clinical Narrative:     Patient will DC to: [facility] Anticipated DC date: 11/23/2019 Family notified: pt stated she will notify husband and daughter Transport by: Corey Harold   Per MD patient ready for DC today . RN, patient, patient's family, and facility notified of DC. Discharge Summary and FL2 sent to facility. RN to call report prior to discharge [phone]). RM# [id number]. DC packet on chart. Ambulance transport requested for patient.   RNCM will sign off for now as intervention is no longer needed. Please consult [place] again if new needs arise.     Barriers to Discharge: Insurance Authorization   Patient Goals and CMS Choice Patient states their goals for this hospitalization and ongoing recovery are:: to get better CMS Medicare.gov Compare Post Acute Care list provided to:: Patient Choice offered to / list presented to : Patient  Discharge Placement                       Discharge Plan and Services   Discharge Planning Services: CM Consult                                 Social Determinants of Health (SDOH) Interventions     Readmission Risk Interventions No flowsheet data found.

## 2019-11-23 NOTE — Plan of Care (Signed)
  Problem: Education: Goal: Knowledge of General Education information will improve Description: Including pain rating scale, medication(s)/side effects and non-pharmacologic comfort measures Outcome: Progressing   Problem: Health Behavior/Discharge Planning: Goal: Ability to manage health-related needs will improve Outcome: Progressing   Problem: Clinical Measurements: Goal: Ability to maintain clinical measurements within normal limits will improve Outcome: Progressing Goal: Will remain free from infection Outcome: Progressing   Problem: Education: Goal: Knowledge of General Education information will improve Description: Including pain rating scale, medication(s)/side effects and non-pharmacologic comfort measures Outcome: Progressing   Problem: Health Behavior/Discharge Planning: Goal: Ability to manage health-related needs will improve Outcome: Progressing   Problem: Clinical Measurements: Goal: Ability to maintain clinical measurements within normal limits will improve Outcome: Progressing   Problem: Clinical Measurements: Goal: Will remain free from infection Outcome: Progressing

## 2019-11-23 NOTE — Discharge Summary (Signed)
Physician Discharge Summary      Patient ID: FLORENCE YEUNG MRN: [id number] DOB/AGE: 65-06-1955 65 y.o.  Admit date: 11/16/2019 Discharge date: 11/23/2019  Admission Diagnoses:  Active Problems:   S/P total knee arthroplasty, right   Discharge Diagnoses:  Same  Surgeries: Procedure(s): RIGHT TOTAL KNEE ARTHROPLASTY-CEMENTED on 11/16/2019   Consultants:   Discharged Condition: Stable  Hospital Course: VILA DORY is an 64 y.o. female who was admitted 11/16/2019 with a chief complaint of right knee pain, and found to have a diagnosis of right knee OA.  They were brought to the operating room on 11/16/2019 and underwent the above named procedures.  Pt awoke from anesthesia and had significant bradycardia to ~30 bpm.  She had several more episodes during her stay that was followed by cardiology and determined to be a vagal response.  Plan for outpatient cardiology follow-up. Throughout her stay, her mobility has significantly progressed and she is able to ambulate relatively easily with a walker for balance.  Pt will be discharged to SNF. Pt will f/u with Dr. Marlou Sa in clinic in ~1 week (2 weeks from surgery).   Antibiotics given:  Anti-infectives (From admission, onward)   Start     Dose/Rate Route Frequency Ordered Stop   11/16/19 2000  vancomycin (VANCOCIN) IVPB 1000 mg/200 mL premix     1,000 mg 200 mL/hr over 60 Minutes Intravenous Every 12 hours 11/16/19 1510 11/16/19 2049   11/16/19 0948  vancomycin (VANCOCIN) powder  Status:  Discontinued       As needed 11/16/19 0948 11/16/19 1051   11/16/19 0600  vancomycin (VANCOCIN) IVPB 1000 mg/200 mL premix  Status:  Discontinued     1,000 mg 200 mL/hr over 60 Minutes Intravenous On call to O.R. 11/16/19 7673 11/16/19 0559   11/16/19 0600  vancomycin (VANCOREADY) IVPB 1500 mg/300 mL     1,500 mg 150 mL/hr over 120 Minutes Intravenous On call to O.R. 11/16/19 0559 11/16/19 0744    .  Recent vital signs:  Vitals:   11/23/19 0325  11/23/19 0755  BP: (!) 106/56 (!) 120/56  Pulse: (!) 58 (!) 56  Resp: 17 16  Temp: 97.7 F (36.5 C) 98.3 F (36.8 C)  SpO2: 96% 96%    Recent laboratory studies:  Results for orders placed or performed during the hospital encounter of 11/16/19  SARS CORONAVIRUS 2 (TAT 6-24 HRS) Nasopharyngeal Nasopharyngeal Swab   Specimen: Nasopharyngeal Swab  Result Value Ref Range   SARS Coronavirus 2 NEGATIVE NEGATIVE  PT- INR at PAT visit (Pre-admission Testing)  Result Value Ref Range   Prothrombin Time 12.6 11.4 - 15.2 seconds   INR 1.0 0.8 - 1.2  Glucose, capillary  Result Value Ref Range   Glucose-Capillary 129 (H) 70 - 99 mg/dL  Glucose, capillary  Result Value Ref Range   Glucose-Capillary 106 (H) 70 - 99 mg/dL  CBC  Result Value Ref Range   WBC 8.3 4.0 - 10.5 K/uL   RBC 4.11 3.87 - 5.11 MIL/uL   Hemoglobin 10.0 (L) 12.0 - 15.0 g/dL   HCT 33.9 (L) 36.0 - 46.0 %   MCV 82.5 80.0 - 100.0 fL   MCH 24.3 (L) 26.0 - 34.0 pg   MCHC 29.5 (L) 30.0 - 36.0 g/dL   RDW 13.9 11.5 - 15.5 %   Platelets 180 150 - 400 K/uL   nRBC 0.0 0.0 - 0.2 %  Glucose, capillary  Result Value Ref Range   Glucose-Capillary 204 (H) 70 - 99 mg/dL  Glucose, capillary  Result Value Ref Range   Glucose-Capillary 503 (HH) 70 - 99 mg/dL   Comment 1 Notify RN   Glucose, capillary  Result Value Ref Range   Glucose-Capillary 113 (H) 70 - 99 mg/dL  CBC  Result Value Ref Range   WBC 6.5 4.0 - 10.5 K/uL   RBC 3.88 3.87 - 5.11 MIL/uL   Hemoglobin 9.5 (L) 12.0 - 15.0 g/dL   HCT 31.8 (L) 36.0 - 46.0 %   MCV 82.0 80.0 - 100.0 fL   MCH 24.5 (L) 26.0 - 34.0 pg   MCHC 29.9 (L) 30.0 - 36.0 g/dL   RDW 14.4 11.5 - 15.5 %   Platelets 178 150 - 400 K/uL   nRBC 0.0 0.0 - 0.2 %  Glucose, capillary  Result Value Ref Range   Glucose-Capillary 124 (H) 70 - 99 mg/dL  Glucose, capillary  Result Value Ref Range   Glucose-Capillary 133 (H) 70 - 99 mg/dL  Glucose, capillary  Result Value Ref Range   Glucose-Capillary 124  (H) 70 - 99 mg/dL  Glucose, capillary  Result Value Ref Range   Glucose-Capillary 130 (H) 70 - 99 mg/dL  Glucose, capillary  Result Value Ref Range   Glucose-Capillary 172 (H) 70 - 99 mg/dL  Glucose, capillary  Result Value Ref Range   Glucose-Capillary 105 (H) 70 - 99 mg/dL  Glucose, capillary  Result Value Ref Range   Glucose-Capillary 222 (H) 70 - 99 mg/dL  Glucose, capillary  Result Value Ref Range   Glucose-Capillary 176 (H) 70 - 99 mg/dL  Glucose, capillary  Result Value Ref Range   Glucose-Capillary 169 (H) 70 - 99 mg/dL  Glucose, capillary  Result Value Ref Range   Glucose-Capillary 124 (H) 70 - 99 mg/dL  Glucose, capillary  Result Value Ref Range   Glucose-Capillary 155 (H) 70 - 99 mg/dL   Comment 1 Document in Chart   Glucose, capillary  Result Value Ref Range   Glucose-Capillary 119 (H) 70 - 99 mg/dL  Glucose, capillary  Result Value Ref Range   Glucose-Capillary 121 (H) 70 - 99 mg/dL  Glucose, capillary  Result Value Ref Range   Glucose-Capillary 101 (H) 70 - 99 mg/dL  Glucose, capillary  Result Value Ref Range   Glucose-Capillary 233 (H) 70 - 99 mg/dL   Comment 1 Document in Chart   Glucose, capillary  Result Value Ref Range   Glucose-Capillary 235 (H) 70 - 99 mg/dL  Glucose, capillary  Result Value Ref Range   Glucose-Capillary 88 70 - 99 mg/dL  Glucose, capillary  Result Value Ref Range   Glucose-Capillary 96 70 - 99 mg/dL  Glucose, capillary  Result Value Ref Range   Glucose-Capillary 170 (H) 70 - 99 mg/dL  Glucose, capillary  Result Value Ref Range   Glucose-Capillary 132 (H) 70 - 99 mg/dL  Glucose, capillary  Result Value Ref Range   Glucose-Capillary 136 (H) 70 - 99 mg/dL  Glucose, capillary  Result Value Ref Range   Glucose-Capillary 90 70 - 99 mg/dL  CBC with Differential/Platelet  Result Value Ref Range   WBC 6.7 4.0 - 10.5 K/uL   RBC 3.78 (L) 3.87 - 5.11 MIL/uL   Hemoglobin 9.5 (L) 12.0 - 15.0 g/dL   HCT 31.5 (L) 36.0 - 46.0 %    MCV 83.3 80.0 - 100.0 fL   MCH 25.1 (L) 26.0 - 34.0 pg   MCHC 30.2 30.0 - 36.0 g/dL   RDW 15.7 (H) 11.5 - 15.5 %  Platelets 274 150 - 400 K/uL   nRBC 0.0 0.0 - 0.2 %   Neutrophils Relative % 62 %   Neutro Abs 4.2 1.7 - 7.7 K/uL   Lymphocytes Relative 21 %   Lymphs Abs 1.4 0.7 - 4.0 K/uL   Monocytes Relative 9 %   Monocytes Absolute 0.6 0.1 - 1.0 K/uL   Eosinophils Relative 6 %   Eosinophils Absolute 0.4 0.0 - 0.5 K/uL   Basophils Relative 1 %   Basophils Absolute 0.1 0.0 - 0.1 K/uL   Immature Granulocytes 1 %   Abs Immature Granulocytes 0.05 0.00 - 0.07 K/uL  Basic metabolic panel  Result Value Ref Range   Sodium 138 135 - 145 mmol/L   Potassium 3.4 (L) 3.5 - 5.1 mmol/L   Chloride 103 98 - 111 mmol/L   CO2 27 22 - 32 mmol/L   Glucose, Bld 174 (H) 70 - 99 mg/dL   BUN 22 8 - 23 mg/dL   Creatinine, Ser 1.03 (H) 0.44 - 1.00 mg/dL   Calcium 10.8 (H) 8.9 - 10.3 mg/dL   GFR calc non Af Amer 57 (L) >60 mL/min   GFR calc Af Amer >60 >60 mL/min   Anion gap 8 5 - 15  Glucose, capillary  Result Value Ref Range   Glucose-Capillary 167 (H) 70 - 99 mg/dL  Glucose, capillary  Result Value Ref Range   Glucose-Capillary 168 (H) 70 - 99 mg/dL  Glucose, capillary  Result Value Ref Range   Glucose-Capillary 104 (H) 70 - 99 mg/dL  ECHOCARDIOGRAM COMPLETE  Result Value Ref Range   Weight 3,728 oz   Height 68 in   BP 135/66 mmHg    Discharge Medications:   Allergies as of 11/23/2019      Reactions   Cephalexin Hives, Rash   Denies Airway involvement   Latex Rash   Neomycin-bacitracin Zn-polymyx Rash   Tape Rash   Latex Prefers paper tape      Medication List    TAKE these medications   amLODipine 10 MG tablet Commonly known as: NORVASC Take 1 tablet (10 mg total) by mouth daily.   apixaban 5 MG Tabs tablet Commonly known as: Eliquis Take 1 tablet (5 mg total) by mouth 2 (two) times daily.   atorvastatin 40 MG tablet Commonly known as: LIPITOR Take 1 tablet (40 mg total)  by mouth daily.   celecoxib 200 MG capsule Commonly known as: CELEBREX Take 1 capsule (200 mg total) by mouth 2 (two) times daily.   ferrous sulfate 324 (65 Fe) MG Tbec Take 1 tablet (325 mg total) by mouth every other day.   gabapentin 300 MG capsule Commonly known as: NEURONTIN Take 300 mg by mouth at bedtime.   gabapentin 100 MG capsule Commonly known as: NEURONTIN Take 100 mg by mouth every morning.   Gattex 5 MG Kit Generic drug: Teduglutide (rDNA) Inject 5 mg into the skin daily.   glucose blood test strip Commonly known as: Accu-Chek Aviva Plus Use to check sugar once daily in the morning   lisinopril-hydrochlorothiazide 20-25 MG tablet Commonly known as: ZESTORETIC Take 1 tablet by mouth daily.   metFORMIN 1000 MG tablet Commonly known as: GLUCOPHAGE Take 1 tablet (1,000 mg total) by mouth daily with breakfast.   methocarbamol 500 MG tablet Commonly known as: ROBAXIN Take 1 tablet (500 mg total) by mouth every 6 (six) hours as needed for muscle spasms. What changed: Another medication with the same name was added. Make sure you understand  how and when to take each.   methocarbamol 500 MG tablet Commonly known as: ROBAXIN Take 1 tablet (500 mg total) by mouth every 8 (eight) hours as needed for muscle spasms. What changed: You were already taking a medication with the same name, and this prescription was added. Make sure you understand how and when to take each.   multivitamin-iron-minerals-folic acid chewable tablet Chew 1 tablet by mouth daily.   NON FORMULARY Diet type:  NAS   oxyCODONE 5 MG immediate release tablet Commonly known as: Oxy IR/ROXICODONE Take 1 tablet (5 mg total) by mouth every 4 (four) hours as needed for severe pain.   sertraline 100 MG tablet Commonly known as: ZOLOFT Take 1 tablet (100 mg total) by mouth daily.       Diagnostic Studies: ECHOCARDIOGRAM COMPLETE  Result Date: 11/18/2019    ECHOCARDIOGRAM REPORT   Patient Name:    TRISHA KEN Date of Exam: 11/18/2019 Medical Rec #:  [id number]     Height:       68.0 in Accession #:    [id number]    Weight:       233.0 lb Date of Birth:  18-Jun-1956     BSA:          2.181 m Patient Age:    27 years      BP:           135/66 mmHg Patient Gender: F             HR:           75 bpm. Exam Location:  Inpatient Procedure: 2D Echo, Cardiac Doppler and Color Doppler Indications:    Atrial Fibrillation 427.31 / I48.91  History:        Patient has no prior history of Echocardiogram examinations.                 CAD; Risk Factors:Diabetes, Dyslipidemia and Former Smoker.                 GERD.  Sonographer:    Vickie Epley RDCS Referring Phys: [id number] [place]  1. Left ventricular ejection fraction, by estimation, is 55 to 60%. The left ventricle has normal function. The left ventricle has no regional wall motion abnormalities. Left ventricular diastolic function could not be evaluated.  2. Right ventricular systolic function is normal. The right ventricular size is normal. There is normal pulmonary artery systolic pressure.  3. The mitral valve is abnormal. Trivial mitral valve regurgitation.  4. The aortic valve is tricuspid. Aortic valve regurgitation is not visualized. FINDINGS  Left Ventricle: Left ventricular ejection fraction, by estimation, is 55 to 60%. The left ventricle has normal function. The left ventricle has no regional wall motion abnormalities. The left ventricular internal cavity size was normal in size. There is  no left ventricular hypertrophy. Left ventricular diastolic function could not be evaluated due to atrial fibrillation. Left ventricular diastolic function could not be evaluated. Right Ventricle: The right ventricular size is normal. No increase in right ventricular wall thickness. Right ventricular systolic function is normal. There is normal pulmonary artery systolic pressure. The tricuspid regurgitant velocity is 2.15 m/s, and  with an assumed  right atrial pressure of 8 mmHg, the estimated right ventricular systolic pressure is 72.0 mmHg. Left Atrium: Left atrial size was normal in size. Right Atrium: Right atrial size was normal in size. Pericardium: There is no evidence of pericardial effusion. Mitral Valve: The mitral valve is abnormal.  Mild to moderate mitral annular calcification. Trivial mitral valve regurgitation. Tricuspid Valve: The tricuspid valve is grossly normal. Tricuspid valve regurgitation is trivial. Aortic Valve: The aortic valve is tricuspid. Aortic valve regurgitation is not visualized. Pulmonic Valve: The pulmonic valve was normal in structure. Pulmonic valve regurgitation is not visualized. Aorta: The aortic root, ascending aorta, aortic arch and descending aorta are all structurally normal, with no evidence of dilitation or obstruction. IAS/Shunts: No atrial level shunt detected by color flow Doppler.  LEFT VENTRICLE PLAX 2D LVIDd:         5.24 cm LVIDs:         3.83 cm LV PW:         1.00 cm LV IVS:        0.99 cm LVOT diam:     2.30 cm LV SV:         58 LV SV Index:   27 LVOT Area:     4.15 cm  LV Volumes (MOD) LV vol d, MOD A2C: 142.0 ml LV vol d, MOD A4C: 127.0 ml LV vol s, MOD A2C: 77.8 ml LV vol s, MOD A4C: 53.4 ml LV SV MOD A2C:     64.2 ml LV SV MOD A4C:     127.0 ml LV SV MOD BP:      70.6 ml RIGHT VENTRICLE RV S prime:     11.50 cm/s TAPSE (M-mode): 1.5 cm LEFT ATRIUM             Index       RIGHT ATRIUM           Index LA diam:        5.00 cm 2.29 cm/m  RA Area:     17.40 cm LA Vol (A2C):   42.4 ml 19.44 ml/m RA Volume:   47.40 ml  21.73 ml/m LA Vol (A4C):   64.8 ml 29.71 ml/m LA Biplane Vol: 54.1 ml 24.81 ml/m  AORTIC VALVE LVOT Vmax:   75.70 cm/s LVOT Vmean:  49.700 cm/s LVOT VTI:    0.140 m  AORTA Ao Root diam: 3.30 cm TRICUSPID VALVE TR Peak grad:   18.5 mmHg TR Vmax:        215.00 cm/s  SHUNTS Systemic VTI:  0.14 m Systemic Diam: 2.30 cm Lyman Bishop MD Electronically signed by Lyman Bishop MD Signature  Date/Time: 11/18/2019/4:14:57 PM    Final     Disposition: Discharge disposition: 03-[facility] [place]       Discharge Instructions    Call MD / Call 911   Complete by: As directed    If you experience chest pain or shortness of breath, CALL 911 and be transported to the hospital emergency room.  If you develope a fever above 101 F, pus (white drainage) or increased drainage or redness at the wound, or calf pain, call your surgeon's office.   Call MD / Call 911   Complete by: As directed    If you experience chest pain or shortness of breath, CALL 911 and be transported to the hospital emergency room.  If you develope a fever above 101 F, pus (white drainage) or increased drainage or redness at the wound, or calf pain, call your surgeon's office.   Call MD / Call 911   Complete by: As directed    If you experience chest pain or shortness of breath, CALL 911 and be transported to the hospital emergency room.  If you develope a fever above 101 F, pus (white  drainage) or increased drainage or redness at the wound, or calf pain, call your surgeon's office.   Constipation Prevention   Complete by: As directed    Drink plenty of fluids.  Prune juice may be helpful.  You may use a stool softener, such as Colace (over the counter) 100 mg twice a day.  Use MiraLax (over the counter) for constipation as needed.   Constipation Prevention   Complete by: As directed    Drink plenty of fluids.  Prune juice may be helpful.  You may use a stool softener, such as Colace (over the counter) 100 mg twice a day.  Use MiraLax (over the counter) for constipation as needed.   Constipation Prevention   Complete by: As directed    Drink plenty of fluids.  Prune juice may be helpful.  You may use a stool softener, such as Colace (over the counter) 100 mg twice a day.  Use MiraLax (over the counter) for constipation as needed.   Diet - low sodium heart healthy   Complete by: As directed    Diet - low sodium  heart healthy   Complete by: As directed    Diet - low sodium heart healthy   Complete by: As directed    Discharge instructions   Complete by: As directed    You may shower, dressing is waterproof.  Do not remove the dressing, we will remove it at your first post-op appointment.  Do not take a bath or soak the knee in a tub or pool.  You may weightbear as you can tolerate on the operative leg with a walker.  Continue using the CPM machine 3 times per day for one hour each time, increasing the degrees of range of motion daily.  Use the blue cradle boot under your heel to work on getting your leg straight.  Do NOT put a pillow under your knee.  You will follow-up with Dr. Marlou Sa in the clinic in 2 weeks at your given appointment date.   Discharge instructions   Complete by: As directed    Weightbearing as tolerated with walker CPM 1 hour 3 times a day Follow-up in clinic approximately 10 days   Increase activity slowly as tolerated   Complete by: As directed    Increase activity slowly as tolerated   Complete by: As directed    Increase activity slowly as tolerated   Complete by: As directed        Contact information for follow-up providers    Thompson Grayer, MD Follow up on 12/29/2019.   Specialty: Cardiology Why: at 3 pm for post hospital follow up Contact information: [place] [address] [phone]            Contact information for after-discharge care    Destination    [facility] .   Service: Skilled Nursing Contact information: [place] [phone]                   Signed: Donella Stade 11/23/2019, 3:15 PM

## 2019-11-23 NOTE — Progress Notes (Signed)
Attempted to call [facility] for report but the nurse was unavailable.  Discharge packet has been printed and [facility] will take the packet to the nursing facility.

## 2019-11-23 NOTE — Telephone Encounter (Signed)
Can you please address this? Thanks.

## 2019-11-23 NOTE — Telephone Encounter (Signed)
Got it taken care of, thanks!

## 2019-11-23 NOTE — Telephone Encounter (Signed)
Patient called asked if Dr Marlou Sa can let the staff know that she can walk on her own using her walker. Patient said she is going to [facility] some time today.  Patient said she don't need help other than using the walker. The number to contact patient is [phone]

## 2019-11-23 NOTE — Progress Notes (Signed)
Physical Therapy Treatment Patient Details Name: [name] MRN: [id number] DOB: [birth year] Today's Date: 11/23/2019    History of Present Illness Pt is a 64 y/o female s/p R TKA. Pt also with episode of bradycardia during surgery; cardiology on board.  PMH includes HTN, DM, a fib, and CAD.     PT Comments    Pt supine in bed on arrival.  Pt continues to work on ambulation without device.  She required min assistance without device with guarded movement.  ROM continues to improve.  Pt is using her CPM appropriately and has been very diligent with it.  Continue to follow recommendations per surgeon.     Follow Up Recommendations  Follow surgeon's recommendation for DC plan and follow-up therapies     Equipment Recommendations  None recommended by PT    Recommendations for Other Services       Precautions / Restrictions Precautions Precautions: Knee Precaution Booklet Issued: No Precaution Comments: reviwed knee precautions and resting in supported extension Restrictions RLE Weight Bearing: Weight bearing as tolerated    Mobility  Bed Mobility Overal bed mobility: Modified Independent Bed Mobility: Supine to Sit     Supine to sit: Modified independent (Device/Increase time)        Transfers Overall transfer level: Modified independent Equipment used: Rolling walker (2 wheeled) Transfers: Sit to/from Stand Sit to Stand: Modified independent (Device/Increase time)         General transfer comment: No assistance to move to standing with good safety.  Able to toilet without assistance.  Ambulation/Gait Ambulation/Gait assistance: Supervision;Min assist Gait Distance (Feet): 200 Feet(+ 30 ft without device) Assistive device: Rolling walker (2 wheeled);None Gait Pattern/deviations: Step-through pattern;Trunk flexed;Antalgic Gait velocity: Decreased   General Gait Details: Pt required cues for trunk control but demonstrates good weight shifting with device.  Pt  performed brief trial without device, minor instability noted and cues for weight shifting and reciprocal armswing.   Stairs             Wheelchair Mobility    Modified Rankin (Stroke Patients Only)       Balance Overall balance assessment: Needs assistance Sitting-balance support: No upper extremity supported;Feet supported Sitting balance-Leahy Scale: Good       Standing balance-Leahy Scale: Fair                              Cognition Arousal/Alertness: Awake/alert Behavior During Therapy: WFL for tasks assessed/performed Overall Cognitive Status: Within Functional Limits for tasks assessed                                        Exercises Total Joint Exercises Ankle Circles/Pumps: AROM;Both;20 reps;Supine Quad Sets: AROM;Right;10 reps;Supine Heel Slides: AROM;Right;10 reps;Supine Hip ABduction/ADduction: AROM;Right;10 reps;Supine Straight Leg Raises: AROM;Right;10 reps;Supine Goniometric ROM: 5-88 degrees flexion in R knee.    General Comments        Pertinent Vitals/Pain Pain Assessment: Faces Faces Pain Scale: Hurts a little bit Pain Location: R knee Pain Descriptors / Indicators: Guarding;Sore Pain Intervention(s): Monitored during session;Ice applied;Repositioned    Home Living                      Prior Function            PT Goals (current goals can now be found in the care plan section)  Acute Rehab PT Goals Patient Stated Goal: to be able to take care of husband  Potential to Achieve Goals: Good Progress towards PT goals: Progressing toward goals    Frequency    7X/week      PT Plan Current plan remains appropriate    Co-evaluation              AM-PAC PT "6 Clicks" Mobility   Outcome Measure  Help needed turning from your back to your side while in a flat bed without using bedrails?: None Help needed moving from lying on your back to sitting on the side of a flat bed without using  bedrails?: None Help needed moving to and from a bed to a chair (including a wheelchair)?: None Help needed standing up from a chair using your arms (e.g., wheelchair or bedside chair)?: None Help needed to walk in hospital room?: A Little Help needed climbing 3-5 steps with a railing? : A Little 6 Click Score: 22    End of Session Equipment Utilized During Treatment: Gait belt Activity Tolerance: Patient tolerated treatment well Patient left: in chair;with chair alarm set;with call bell/phone within reach Nurse Communication: Mobility status PT Visit Diagnosis: Muscle weakness (generalized) (M62.81);Unsteadiness on feet (R26.81)     Time: 9093-1121 PT Time Calculation (min) (ACUTE ONLY): 29 min  Charges:  $Gait Training: 8-22 mins $Therapeutic Exercise: 8-22 mins                     [name] , PTA Acute Rehabilitation Services Pager [phone] Office [phone]     [name] 11/23/2019, 10:42 AM

## 2019-11-23 NOTE — TOC Progression Note (Signed)
Transition of Care [facility]) - Progression Note    Patient Details  Name: [name] MRN: [id number] Date of Birth: 05/22/1956  [facility] CM/SW Contact  Sharin Mons, RN Phone Number: [phone] 11/23/2019, 10:02 AM  Clinical Narrative:     Insurance auth. pending for SNF.  TOC team will continue to monitor and follow.  Expected Discharge Plan: Skilled Nursing Facility([facility]) Barriers to Discharge: Insurance Authorization  Expected Discharge Plan and Services Expected Discharge Plan: Skilled Nursing Facility([facility])   Discharge Planning Services: CM Consult   Living arrangements for the past 2 months: Single Family Home Expected Discharge Date: 11/20/19                      Social Determinants of Health (SDOH) Interventions    Readmission Risk Interventions No flowsheet data found.

## 2019-11-23 NOTE — Progress Notes (Signed)
PT Cancellation Note  Patient Details Name: [name] MRN: [id number] DOB: [birth year]   Cancelled Treatment:    Reason Eval/Treat Not Completed: (P) Patient declined, no reason specified(Pt to d/c to SNF this pm, will defer PT needs at this time to next level of care.)   [name] J Stann Mainland 11/23/2019, 4:52 PM  [name] , PTA Acute Rehabilitation Services Pager [phone] Office [phone]

## 2019-11-23 NOTE — Progress Notes (Signed)
Patient stable Pain controlled.  Patient is ambulating well. Ready for discharge to skilled nursing.  Anticipate a week or less stay there before she becomes fully functional to care for her ailing husband at home. She is ready for discharge today.

## 2019-11-24 DIAGNOSIS — E785 Hyperlipidemia, unspecified: Secondary | ICD-10-CM | POA: Diagnosis not present

## 2019-11-24 DIAGNOSIS — Z96659 Presence of unspecified artificial knee joint: Secondary | ICD-10-CM | POA: Diagnosis not present

## 2019-11-24 DIAGNOSIS — D509 Iron deficiency anemia, unspecified: Secondary | ICD-10-CM | POA: Diagnosis not present

## 2019-11-24 DIAGNOSIS — E119 Type 2 diabetes mellitus without complications: Secondary | ICD-10-CM | POA: Diagnosis not present

## 2019-11-24 DIAGNOSIS — E1165 Type 2 diabetes mellitus with hyperglycemia: Secondary | ICD-10-CM | POA: Diagnosis not present

## 2019-11-24 DIAGNOSIS — I2510 Atherosclerotic heart disease of native coronary artery without angina pectoris: Secondary | ICD-10-CM | POA: Diagnosis not present

## 2019-11-24 DIAGNOSIS — F320 Major depressive disorder, single episode, mild: Secondary | ICD-10-CM | POA: Diagnosis not present

## 2019-11-24 DIAGNOSIS — E039 Hypothyroidism, unspecified: Secondary | ICD-10-CM | POA: Diagnosis not present

## 2019-11-24 DIAGNOSIS — I10 Essential (primary) hypertension: Secondary | ICD-10-CM | POA: Diagnosis not present

## 2019-11-24 DIAGNOSIS — G63 Polyneuropathy in diseases classified elsewhere: Secondary | ICD-10-CM | POA: Diagnosis not present

## 2019-11-24 DIAGNOSIS — I480 Paroxysmal atrial fibrillation: Secondary | ICD-10-CM | POA: Diagnosis not present

## 2019-11-24 DIAGNOSIS — E782 Mixed hyperlipidemia: Secondary | ICD-10-CM | POA: Diagnosis not present

## 2019-11-24 NOTE — Telephone Encounter (Signed)
Pls advise. Thanks.  

## 2019-11-24 NOTE — Telephone Encounter (Signed)
Sounds good to me, she was walking well when I saw her in the hospital so I think she should be able to balance and ambulate well solo as long as she has walker

## 2019-11-25 NOTE — Telephone Encounter (Signed)
IC LM with nurse at [employer]

## 2019-11-26 ENCOUNTER — Telehealth: Payer: Self-pay | Admitting: *Deleted

## 2019-11-26 ENCOUNTER — Telehealth: Payer: Self-pay | Admitting: Orthopedic Surgery

## 2019-11-26 NOTE — Care Plan (Signed)
RNCM call to patient today to discuss her surgery that was done on 11/16/19 per Dr. Marlou Sa. Patient is an Ortho bundle patient through [facility]. Due to scheduling initially of this R-TKA back in December of last year and rescheduling several times, patient was missed for her Pre-operative call by [facility]. She was unknown to [facility] as [facility] was not included in the bundle into earlier this year. MD is aware of delay of bundle case management. Patient is currently in [facility] for Rehab as she has a paraplegic husband that she cares for at home and needs to be as functional as possible before returning home. Hospital stay was also complicated by recurrent bradycardic episodes. LOS in hospital was 5 days. D/C to SNF on 11/23/19. Anticipate d/c from SNF on Monday or Tuesday 11/29/19 or 11/30/19. She requested HHPT- [facility]. Referral has been made. Discussed anticipated OPPT and she informed she doesn't think she can afford this. Will discuss with MD. Patient provided RNCM's number as well as [facility] office number. Will continue to follow.

## 2019-11-26 NOTE — Telephone Encounter (Signed)
Ortho bundle discharge and 7 day call completed- Combined.

## 2019-11-26 NOTE — Telephone Encounter (Signed)
I have made Sonia Side with [facility] aware.

## 2019-11-26 NOTE — Telephone Encounter (Signed)
Patient calling to ask for Joelene Millin (physical therapist) with [facility] when she does start in home physical therapy. Patient stated she was hoping to go home on Monday.   I told patient we could have [facility] reach out to her today if possible.    Patient's cb# [phone]

## 2019-12-01 ENCOUNTER — Other Ambulatory Visit: Payer: Self-pay

## 2019-12-01 ENCOUNTER — Encounter: Payer: Self-pay | Admitting: Orthopedic Surgery

## 2019-12-01 ENCOUNTER — Ambulatory Visit (INDEPENDENT_AMBULATORY_CARE_PROVIDER_SITE_OTHER): Payer: Medicare HMO

## 2019-12-01 ENCOUNTER — Ambulatory Visit (INDEPENDENT_AMBULATORY_CARE_PROVIDER_SITE_OTHER): Payer: Medicare HMO | Admitting: Orthopedic Surgery

## 2019-12-01 ENCOUNTER — Telehealth: Payer: Self-pay

## 2019-12-01 DIAGNOSIS — Z96651 Presence of right artificial knee joint: Secondary | ICD-10-CM | POA: Diagnosis not present

## 2019-12-01 NOTE — Telephone Encounter (Signed)
Patient scheduled for doppler r/o DVT at 10am on 12/02/19 at [facility] I have called patient and LMVM for her advising. Asked her to clal me back to confirm she received VM.

## 2019-12-01 NOTE — Telephone Encounter (Signed)
Tried calling again, no answer. Went straight to VM LM again.

## 2019-12-01 NOTE — Patient Outreach (Signed)
[facility]) Care Management  12/01/2019  CAROLEEN STOERMER August 16, 1955 [id number]     Transition of Care Referral  Referral Date: 12/01/2019 Referral Source: [employer] Discharge Report Date of Discharge: 11/29/2019 Facility: [facility] Insurance: [facility]   Referral received. Transition of care calls being completed via EMMI-automated calls. RN CM will outreach patient for any red flags received.     Plan: RN CM will close case.    Enzo Montgomery, RN,BSN,CCM [facility] Management Telephonic Care Management Coordinator Direct Phone: [phone] Toll Free: [phone] Fax: [phone]

## 2019-12-02 ENCOUNTER — Ambulatory Visit (HOSPITAL_COMMUNITY)
Admission: RE | Admit: 2019-12-02 | Discharge: 2019-12-02 | Disposition: A | Discharge status: Home / Self Care | Payer: Medicare HMO | Source: Ambulatory Visit | Attending: Orthopedic Surgery | Admitting: Orthopedic Surgery

## 2019-12-02 ENCOUNTER — Other Ambulatory Visit: Payer: Self-pay

## 2019-12-02 DIAGNOSIS — Z96651 Presence of right artificial knee joint: Secondary | ICD-10-CM | POA: Diagnosis not present

## 2019-12-02 NOTE — Progress Notes (Signed)
VASCULAR LAB PRELIMINARY  PRELIMINARY  PRELIMINARY  PRELIMINARY  Right lower extremity venous duplex completed.    Preliminary report:  See CV proc for preliminary results.  Called Dr. Marlou Sa with results  Mauro Kaufmann, Willia Genrich, RVT 12/02/2019, 10:40 AM

## 2019-12-02 NOTE — Telephone Encounter (Signed)
Patient went for doppler Negative for DVT

## 2019-12-03 ENCOUNTER — Encounter: Payer: Self-pay | Admitting: Orthopedic Surgery

## 2019-12-03 ENCOUNTER — Telehealth: Payer: Self-pay | Admitting: *Deleted

## 2019-12-03 DIAGNOSIS — Z471 Aftercare following joint replacement surgery: Secondary | ICD-10-CM | POA: Diagnosis not present

## 2019-12-03 DIAGNOSIS — I480 Paroxysmal atrial fibrillation: Secondary | ICD-10-CM | POA: Diagnosis not present

## 2019-12-03 DIAGNOSIS — I129 Hypertensive chronic kidney disease with stage 1 through stage 4 chronic kidney disease, or unspecified chronic kidney disease: Secondary | ICD-10-CM | POA: Diagnosis not present

## 2019-12-03 DIAGNOSIS — I2510 Atherosclerotic heart disease of native coronary artery without angina pectoris: Secondary | ICD-10-CM | POA: Diagnosis not present

## 2019-12-03 DIAGNOSIS — E1151 Type 2 diabetes mellitus with diabetic peripheral angiopathy without gangrene: Secondary | ICD-10-CM | POA: Diagnosis not present

## 2019-12-03 DIAGNOSIS — I872 Venous insufficiency (chronic) (peripheral): Secondary | ICD-10-CM | POA: Diagnosis not present

## 2019-12-03 DIAGNOSIS — E1122 Type 2 diabetes mellitus with diabetic chronic kidney disease: Secondary | ICD-10-CM | POA: Diagnosis not present

## 2019-12-03 DIAGNOSIS — E1142 Type 2 diabetes mellitus with diabetic polyneuropathy: Secondary | ICD-10-CM | POA: Diagnosis not present

## 2019-12-03 DIAGNOSIS — N181 Chronic kidney disease, stage 1: Secondary | ICD-10-CM | POA: Diagnosis not present

## 2019-12-03 NOTE — Telephone Encounter (Signed)
Attempted Ortho bundle call to patient to check status after being discharged from SNF this week; no answer on either home or cell numbers. Left VM on both requesting call back.

## 2019-12-03 NOTE — Progress Notes (Signed)
Post-Op Visit Note   Patient: [name]           Date of Birth: 06/27/1955           MRN: [id number] Visit Date: 12/01/2019 PCP: Daisy Floro, DO   Assessment & Plan:  Chief Complaint: No chief complaint on file.  Visit Diagnoses:  1. S/P total knee arthroplasty, right     Plan: [name] is now 2 weeks out right cemented total knee replacement.  Radiographs look good.  She just got home from skilled nursing.  On exam she does have some right calf swelling.  Ultrasound at the time of this dictation is negative for DVT.  I would like her to start some home health care for range of motion and strengthening.  She is on Eliquis.  3-week return for clinical recheck on range of motion.  Today she is lacking about 5 degrees of full extension but can bend it to about 90 which is actually pretty reasonable.  Incision is intact.  Follow-Up Instructions: Return in about 3 weeks (around 12/22/2019).   Orders:  Orders Placed This Encounter  Procedures   XR Knee 1-2 Views Right   VAS [place] LOWER EXTREMITY VENOUS (DVT)   No orders of the defined types were placed in this encounter.   Imaging: No results found.  PMFS History: Patient Active Problem List   Diagnosis Date Noted   S/P total knee arthroplasty, right 11/16/2019   Pica 10/29/2019   Surgical counseling visit 10/02/2019   Blood in urine 07/19/2019   Hypothyroidism 07/19/2019   Nausea 07/19/2019   Sinus bradycardia 07/19/2019   Vaginal irritation 07/19/2019   Screening mammogram, encounter for 07/12/2019   Hypertension associated with diabetes ([place]) 07/11/2018   Dyslipidemia associated with type 2 diabetes mellitus ([place]) 07/11/2018   Short bowel syndrome 07/11/2018   Peripheral autonomic neuropathy due to diabetes mellitus ([place]) 07/11/2018   Status post left knee replacement 07/11/2018   Major depression in remission ([place]) 07/07/2018   Primary osteoarthritis involving multiple joints 07/07/2018    Coronary artery disease involving native coronary artery of native heart without angina pectoris 07/07/2018   Paroxysmal atrial fibrillation (HCC) 05/11/2018   Mild cognitive impairment 04/30/2018   Generalized abdominal pain 02/05/2018   Spigelian hernia 02/05/2018   Venous stasis dermatitis of right lower extremity 01/02/2018   Genetic testing 11/24/2017   Family history of colon cancer    Dyslipidemia 04/15/2017   Gastroesophageal reflux disease without esophagitis 59/93/5701   Umbilical hernia without obstruction and without gangrene 04/15/2017   B12 deficiency 12/13/2016   Malabsorption 03/12/2016   Tubular adenoma of colon 03/09/2016   Vitamin D deficiency 03/09/2016   Fecal incontinence 08/02/2015   Stress 05/24/2015   S/P bariatric surgery-duodenal switch with sleeve gastrectomy 12/04/2013   Nonobstructive CAD  08/26/2013   Iron deficiency anemia 06/19/2012   Low back pain 03/05/2012   PSORIASIS 05/28/2010   HYPERCHOLESTEROLEMIA 03/20/2010   Peripheral neuropathy 01/12/2009   Type 2 diabetes, controlled, with neuropathy ([place]) 02/18/2008   Chronic depression 10/06/2007   OBSTRUCTIVE SLEEP APNEA 06/12/2007   GOITER, MULTINODULAR 05/13/2007   Past Medical History:  Diagnosis Date   Allergy    Anemia    ANEMIA, PERNICIOUS, HX OF 05/13/2007   Anxiety    Arthritis    Arthritis of knee 06/30/2018   ASYMPTOMATIC POSTMENOPAUSAL STATUS 02/18/2008   BACK PAIN, LUMBAR 11/19/2007   Bowel incontinence    Cataract    Cellulitis of left lower leg  10/28/2013   Cellulitis of leg, right 10/11/2010   Chronic dermatitis of hands 05/02/2009   DEPRESSION, CHRONIC 10/06/2007   Diabetes mellitus without complication ([place])    DIABETES MELLITUS, WITH NEUROLOGICAL COMPLICATIONS 6/38/7564   Diabetic neuropathy ([place])    DYSLIPIDEMIA 05/13/2007   Essential hypertension 03/20/2010   Family history of colon cancer    Fecal soiling 06/28/2014   Frozen  shoulder    Lt   Full dentures    GERD (gastroesophageal reflux disease)    GI bleed 03/15/2016   GOITER, MULTINODULAR 05/13/2007   Headache    History of blood transfusion    Hx of colonic polyps 12/04/2010   HYPERCHOLESTEROLEMIA 03/20/2010   HYPERTENSION 03/20/2010   Lower back pain    NASH (nonalcoholic steatohepatitis)    OA (osteoarthritis)    OBSTRUCTIVE SLEEP APNEA 06/12/2007   uses CPAP.   PERIPHERAL NEUROPATHY 01/12/2009   Peripheral vascular disease ([place])    Pneumonia    Primary osteoarthritis of left knee 04/23/2011   Previously seen by Dr. Alphonzo Severance. Will plan on repeat referral after trial injection.      PSORIASIS 05/28/2010   Rectal prolapse 05/24/2015   Rectovaginal fistula 05/25/2014   Renal insufficiency    stage 1 kd   Sleep apnea    Unilateral primary osteoarthritis, left knee    UNSPECIFIED VENOUS INSUFFICIENCY 05/28/2010   UNSPECIFIED VENOUS INSUFFICIENCY 05/28/2010   Patient with frequent ulceration related to venous stasis-seen at wound care. Edema and Venous stasis changes due to this . Echo 04/16/11 with EF 60%. No signs diastolic dysfunction.        Wears glasses     Family History  Problem Relation Age of Onset   Hyperlipidemia Father    Hypertension Father    Cirrhosis Father    Colon cancer Father 60       d. 39   Colon cancer Sister 43       d. 17   Bipolar disorder Sister    Aneurysm Mother        brain   Cerebral aneurysm Mother    Colon cancer Sister 55       d. 81   Bipolar disorder Sister    Cancer Paternal Aunt        NOS, ? colon   Congestive Heart Failure Maternal Grandmother    Drug abuse Neg Hx    CAD Neg Hx    Stomach cancer Neg Hx     Past Surgical History:  Procedure Laterality Date   CATARACT EXTRACTION W/ INTRAOCULAR LENS  IMPLANT, BILATERAL     COLONOSCOPY     ELECTROCARDIOGRAM  04/16/2006   EXAMINATION UNDER ANESTHESIA N/A 06/29/2014   Procedure: EXAM UNDER ANESTHESIA;   Surgeon: Janyth Contes, MD;  Location: [facility];  Service: Gynecology;  Laterality: N/A;   Exercise myoview  01/24/2005   FLEXIBLE SIGMOIDOSCOPY     GASTRIC BYPASS  11/09/2013   GASTRIC BYPASS     JOINT REPLACEMENT     KNEE ARTHROSCOPY  2003   right   LAPAROSCOPY N/A 03/12/2016   Procedure: LAPAROSCOPIC ANASTOMOSIS OF INTESTINE (ENTEROENTEROSTOMY);  Surgeon: Ladora Daniel, MD;  Location: [facility];  Service: General;  Laterality: N/A;   LEG SURGERY Left    metal and pins in lower left leg   mrsa Right    arm   MULTIPLE TOOTH EXTRACTIONS     SHOULDER ARTHROSCOPY W/ ROTATOR CUFF REPAIR Right    stab phlebectomy  Right 02/10/2019  stab phlebectomy > 20 incisions right leg by [name] MD    TONSILLECTOMY     TOTAL KNEE ARTHROPLASTY Left 06/30/2018   Procedure: LEFT TOTAL KNEE ARTHROPLASTY;  Surgeon: Meredith Pel, MD;  Location: [place];  Service: Orthopedics;  Laterality: Left;   TOTAL KNEE ARTHROPLASTY Right 11/16/2019   TOTAL KNEE ARTHROPLASTY Right 11/16/2019   Procedure: RIGHT TOTAL KNEE ARTHROPLASTY-CEMENTED;  Surgeon: Meredith Pel, MD;  Location: [place];  Service: Orthopedics;  Laterality: Right;   VARICOSE VEIN SURGERY     Remotef   Social History   Occupational History   Occupation: Unemployed    Comment: disabled  Tobacco Use   Smoking status: Former Smoker    Packs/day: 2.00    Years: 24.00    Pack years: 48.00    Types: Cigarettes    Start date: 06/24/1970    Quit date: 08/06/1994    Years since quitting: 25.3   Smokeless tobacco: Never Used  Vaping Use   Vaping Use: Never used  Substance and Sexual Activity   Alcohol use: No    Alcohol/week: 0.0 standard drinks   Drug use: No   Sexual activity: Not Currently

## 2019-12-06 ENCOUNTER — Telehealth: Payer: Self-pay | Admitting: *Deleted

## 2019-12-06 DIAGNOSIS — Z471 Aftercare following joint replacement surgery: Secondary | ICD-10-CM | POA: Diagnosis not present

## 2019-12-06 DIAGNOSIS — I129 Hypertensive chronic kidney disease with stage 1 through stage 4 chronic kidney disease, or unspecified chronic kidney disease: Secondary | ICD-10-CM | POA: Diagnosis not present

## 2019-12-06 DIAGNOSIS — N181 Chronic kidney disease, stage 1: Secondary | ICD-10-CM | POA: Diagnosis not present

## 2019-12-06 DIAGNOSIS — E1122 Type 2 diabetes mellitus with diabetic chronic kidney disease: Secondary | ICD-10-CM | POA: Diagnosis not present

## 2019-12-06 DIAGNOSIS — I872 Venous insufficiency (chronic) (peripheral): Secondary | ICD-10-CM | POA: Diagnosis not present

## 2019-12-06 DIAGNOSIS — I2510 Atherosclerotic heart disease of native coronary artery without angina pectoris: Secondary | ICD-10-CM | POA: Diagnosis not present

## 2019-12-06 DIAGNOSIS — E1151 Type 2 diabetes mellitus with diabetic peripheral angiopathy without gangrene: Secondary | ICD-10-CM | POA: Diagnosis not present

## 2019-12-06 DIAGNOSIS — I480 Paroxysmal atrial fibrillation: Secondary | ICD-10-CM | POA: Diagnosis not present

## 2019-12-06 DIAGNOSIS — E1142 Type 2 diabetes mellitus with diabetic polyneuropathy: Secondary | ICD-10-CM | POA: Diagnosis not present

## 2019-12-06 NOTE — Telephone Encounter (Signed)
Ortho bundle 14 day call completed. 

## 2019-12-08 DIAGNOSIS — I2510 Atherosclerotic heart disease of native coronary artery without angina pectoris: Secondary | ICD-10-CM | POA: Diagnosis not present

## 2019-12-08 DIAGNOSIS — E1142 Type 2 diabetes mellitus with diabetic polyneuropathy: Secondary | ICD-10-CM | POA: Diagnosis not present

## 2019-12-08 DIAGNOSIS — I480 Paroxysmal atrial fibrillation: Secondary | ICD-10-CM | POA: Diagnosis not present

## 2019-12-08 DIAGNOSIS — E1151 Type 2 diabetes mellitus with diabetic peripheral angiopathy without gangrene: Secondary | ICD-10-CM | POA: Diagnosis not present

## 2019-12-08 DIAGNOSIS — E1122 Type 2 diabetes mellitus with diabetic chronic kidney disease: Secondary | ICD-10-CM | POA: Diagnosis not present

## 2019-12-08 DIAGNOSIS — I872 Venous insufficiency (chronic) (peripheral): Secondary | ICD-10-CM | POA: Diagnosis not present

## 2019-12-08 DIAGNOSIS — N181 Chronic kidney disease, stage 1: Secondary | ICD-10-CM | POA: Diagnosis not present

## 2019-12-08 DIAGNOSIS — I129 Hypertensive chronic kidney disease with stage 1 through stage 4 chronic kidney disease, or unspecified chronic kidney disease: Secondary | ICD-10-CM | POA: Diagnosis not present

## 2019-12-08 DIAGNOSIS — Z471 Aftercare following joint replacement surgery: Secondary | ICD-10-CM | POA: Diagnosis not present

## 2019-12-09 DIAGNOSIS — Z01818 Encounter for other preprocedural examination: Secondary | ICD-10-CM | POA: Diagnosis not present

## 2019-12-09 DIAGNOSIS — H02401 Unspecified ptosis of right eyelid: Secondary | ICD-10-CM | POA: Diagnosis not present

## 2019-12-09 DIAGNOSIS — E119 Type 2 diabetes mellitus without complications: Secondary | ICD-10-CM | POA: Diagnosis not present

## 2019-12-10 ENCOUNTER — Other Ambulatory Visit: Payer: Self-pay | Admitting: Family Medicine

## 2019-12-10 DIAGNOSIS — E1151 Type 2 diabetes mellitus with diabetic peripheral angiopathy without gangrene: Secondary | ICD-10-CM | POA: Diagnosis not present

## 2019-12-10 DIAGNOSIS — E1122 Type 2 diabetes mellitus with diabetic chronic kidney disease: Secondary | ICD-10-CM | POA: Diagnosis not present

## 2019-12-10 DIAGNOSIS — Z471 Aftercare following joint replacement surgery: Secondary | ICD-10-CM | POA: Diagnosis not present

## 2019-12-10 DIAGNOSIS — I2510 Atherosclerotic heart disease of native coronary artery without angina pectoris: Secondary | ICD-10-CM | POA: Diagnosis not present

## 2019-12-10 DIAGNOSIS — I872 Venous insufficiency (chronic) (peripheral): Secondary | ICD-10-CM | POA: Diagnosis not present

## 2019-12-10 DIAGNOSIS — I480 Paroxysmal atrial fibrillation: Secondary | ICD-10-CM | POA: Diagnosis not present

## 2019-12-10 DIAGNOSIS — N181 Chronic kidney disease, stage 1: Secondary | ICD-10-CM | POA: Diagnosis not present

## 2019-12-10 DIAGNOSIS — E1142 Type 2 diabetes mellitus with diabetic polyneuropathy: Secondary | ICD-10-CM | POA: Diagnosis not present

## 2019-12-10 DIAGNOSIS — I129 Hypertensive chronic kidney disease with stage 1 through stage 4 chronic kidney disease, or unspecified chronic kidney disease: Secondary | ICD-10-CM | POA: Diagnosis not present

## 2019-12-14 DIAGNOSIS — I2510 Atherosclerotic heart disease of native coronary artery without angina pectoris: Secondary | ICD-10-CM | POA: Diagnosis not present

## 2019-12-14 DIAGNOSIS — I872 Venous insufficiency (chronic) (peripheral): Secondary | ICD-10-CM | POA: Diagnosis not present

## 2019-12-14 DIAGNOSIS — N181 Chronic kidney disease, stage 1: Secondary | ICD-10-CM | POA: Diagnosis not present

## 2019-12-14 DIAGNOSIS — I480 Paroxysmal atrial fibrillation: Secondary | ICD-10-CM | POA: Diagnosis not present

## 2019-12-14 DIAGNOSIS — E1122 Type 2 diabetes mellitus with diabetic chronic kidney disease: Secondary | ICD-10-CM | POA: Diagnosis not present

## 2019-12-14 DIAGNOSIS — E1142 Type 2 diabetes mellitus with diabetic polyneuropathy: Secondary | ICD-10-CM | POA: Diagnosis not present

## 2019-12-14 DIAGNOSIS — I129 Hypertensive chronic kidney disease with stage 1 through stage 4 chronic kidney disease, or unspecified chronic kidney disease: Secondary | ICD-10-CM | POA: Diagnosis not present

## 2019-12-14 DIAGNOSIS — E1151 Type 2 diabetes mellitus with diabetic peripheral angiopathy without gangrene: Secondary | ICD-10-CM | POA: Diagnosis not present

## 2019-12-14 DIAGNOSIS — Z471 Aftercare following joint replacement surgery: Secondary | ICD-10-CM | POA: Diagnosis not present

## 2019-12-17 DIAGNOSIS — E1142 Type 2 diabetes mellitus with diabetic polyneuropathy: Secondary | ICD-10-CM | POA: Diagnosis not present

## 2019-12-17 DIAGNOSIS — N181 Chronic kidney disease, stage 1: Secondary | ICD-10-CM | POA: Diagnosis not present

## 2019-12-17 DIAGNOSIS — I129 Hypertensive chronic kidney disease with stage 1 through stage 4 chronic kidney disease, or unspecified chronic kidney disease: Secondary | ICD-10-CM | POA: Diagnosis not present

## 2019-12-17 DIAGNOSIS — I872 Venous insufficiency (chronic) (peripheral): Secondary | ICD-10-CM | POA: Diagnosis not present

## 2019-12-17 DIAGNOSIS — I480 Paroxysmal atrial fibrillation: Secondary | ICD-10-CM | POA: Diagnosis not present

## 2019-12-17 DIAGNOSIS — I2510 Atherosclerotic heart disease of native coronary artery without angina pectoris: Secondary | ICD-10-CM | POA: Diagnosis not present

## 2019-12-17 DIAGNOSIS — E1151 Type 2 diabetes mellitus with diabetic peripheral angiopathy without gangrene: Secondary | ICD-10-CM | POA: Diagnosis not present

## 2019-12-17 DIAGNOSIS — E1122 Type 2 diabetes mellitus with diabetic chronic kidney disease: Secondary | ICD-10-CM | POA: Diagnosis not present

## 2019-12-17 DIAGNOSIS — Z471 Aftercare following joint replacement surgery: Secondary | ICD-10-CM | POA: Diagnosis not present

## 2019-12-21 DIAGNOSIS — E1142 Type 2 diabetes mellitus with diabetic polyneuropathy: Secondary | ICD-10-CM | POA: Diagnosis not present

## 2019-12-21 DIAGNOSIS — Z471 Aftercare following joint replacement surgery: Secondary | ICD-10-CM | POA: Diagnosis not present

## 2019-12-21 DIAGNOSIS — I129 Hypertensive chronic kidney disease with stage 1 through stage 4 chronic kidney disease, or unspecified chronic kidney disease: Secondary | ICD-10-CM | POA: Diagnosis not present

## 2019-12-21 DIAGNOSIS — E1122 Type 2 diabetes mellitus with diabetic chronic kidney disease: Secondary | ICD-10-CM | POA: Diagnosis not present

## 2019-12-21 DIAGNOSIS — I872 Venous insufficiency (chronic) (peripheral): Secondary | ICD-10-CM | POA: Diagnosis not present

## 2019-12-21 DIAGNOSIS — I2510 Atherosclerotic heart disease of native coronary artery without angina pectoris: Secondary | ICD-10-CM | POA: Diagnosis not present

## 2019-12-21 DIAGNOSIS — E1151 Type 2 diabetes mellitus with diabetic peripheral angiopathy without gangrene: Secondary | ICD-10-CM | POA: Diagnosis not present

## 2019-12-21 DIAGNOSIS — I480 Paroxysmal atrial fibrillation: Secondary | ICD-10-CM | POA: Diagnosis not present

## 2019-12-21 DIAGNOSIS — N181 Chronic kidney disease, stage 1: Secondary | ICD-10-CM | POA: Diagnosis not present

## 2019-12-22 ENCOUNTER — Ambulatory Visit (INDEPENDENT_AMBULATORY_CARE_PROVIDER_SITE_OTHER): Payer: Medicare HMO | Admitting: Orthopedic Surgery

## 2019-12-22 ENCOUNTER — Telehealth: Payer: Self-pay | Admitting: *Deleted

## 2019-12-22 DIAGNOSIS — Z96651 Presence of right artificial knee joint: Secondary | ICD-10-CM

## 2019-12-22 NOTE — Care Plan (Signed)
Met with patient today during her f/u with Dr. Marlou Sa. Discussed progress made with HHPT. Patient insistent that she does not want to attend OPPT due to her being primary caregiver for her husband as he is paraplegic. She demonstrated almost 0 degrees extension and close to full flexion; knee is swollen-discussed use of thigh high TED hose for swelling; she was using a cane today and drove herself to the appointment; discussed with patient, PA, Dr. Marlou Sa and therapy liaison that patient has requested additional HHPT instead of OPPT. Recommendation from all parties to continue for 2 more weeks, then discharge since patient most likely meeting all goals by that time. After visit, left VM for patient with information regarding continued home health, but for only 2 more weeks as opposed to 6 more weeks as discussed with PA earlier at the office visit. Requested call back with questions. Verbal orders given to [place] with [facility].

## 2019-12-22 NOTE — Telephone Encounter (Signed)
Ortho bundle 30 day call completed. °

## 2019-12-23 ENCOUNTER — Telehealth: Payer: Self-pay

## 2019-12-23 DIAGNOSIS — E1142 Type 2 diabetes mellitus with diabetic polyneuropathy: Secondary | ICD-10-CM | POA: Diagnosis not present

## 2019-12-23 DIAGNOSIS — Z471 Aftercare following joint replacement surgery: Secondary | ICD-10-CM | POA: Diagnosis not present

## 2019-12-23 DIAGNOSIS — I2510 Atherosclerotic heart disease of native coronary artery without angina pectoris: Secondary | ICD-10-CM | POA: Diagnosis not present

## 2019-12-23 DIAGNOSIS — N181 Chronic kidney disease, stage 1: Secondary | ICD-10-CM | POA: Diagnosis not present

## 2019-12-23 DIAGNOSIS — I872 Venous insufficiency (chronic) (peripheral): Secondary | ICD-10-CM | POA: Diagnosis not present

## 2019-12-23 DIAGNOSIS — I480 Paroxysmal atrial fibrillation: Secondary | ICD-10-CM | POA: Diagnosis not present

## 2019-12-23 DIAGNOSIS — E1151 Type 2 diabetes mellitus with diabetic peripheral angiopathy without gangrene: Secondary | ICD-10-CM | POA: Diagnosis not present

## 2019-12-23 DIAGNOSIS — E1122 Type 2 diabetes mellitus with diabetic chronic kidney disease: Secondary | ICD-10-CM | POA: Diagnosis not present

## 2019-12-23 DIAGNOSIS — I129 Hypertensive chronic kidney disease with stage 1 through stage 4 chronic kidney disease, or unspecified chronic kidney disease: Secondary | ICD-10-CM | POA: Diagnosis not present

## 2019-12-23 NOTE — Telephone Encounter (Signed)
I called talked with patient and advised per our conversation that likely she will only receive 2 more weeks of HHPT due to potentially not being covered. Was [facility] going to advise patient of this? She said she had not talked with them?

## 2019-12-23 NOTE — Telephone Encounter (Signed)
Patient wanted to know how much longer does she has to be seen for ? 4 more weeks or 6 more weeks after the knee replacement.

## 2019-12-24 NOTE — Telephone Encounter (Signed)
I actually tried to call her the day of her appointment and after we all discussed it. There was no answer, but I left a message informing her that she would only be ordered 2 more weeks of HHPT and then discharge. I explained why and that she should call me back for questions. I'll try her again since I couldn't get her. HH should make her aware also. I'll make sure they explain it while they are there. Thanks.

## 2019-12-27 ENCOUNTER — Encounter: Payer: Self-pay | Admitting: Orthopedic Surgery

## 2019-12-27 NOTE — Progress Notes (Signed)
Post-Op Visit Note   Patient: [name]           Date of Birth: 09-02-1955           MRN: [id number] Visit Date: 12/22/2019 PCP: Daisy Floro, DO   Assessment & Plan:  Chief Complaint:  Chief Complaint  Patient presents with  . Right Knee - Pain   Visit Diagnoses: No diagnosis found.  Plan: Patient is a 64 year old female presents s/p right total knee arthroplasty on 11/16/2019.  She is ambulating with a cane.  She is doing home health physical therapy 2 times a week.  She has finished her CPM machine.  She is only taking oxycodone as needed which is about twice a day.  On exam she has 0 degrees of extension and 105 degrees of flexion.  She has reached up to 113 degrees of flexion in physical therapy.  Incision is healing well without any evidence of dehiscence or infection.  No calf tenderness on exam and a negative Homans' sign.  Denies any fevers, chills, night sweats.  She does have an upcoming cardiology appointment with Dr. Rayann Heman.  She has had one episode of dizziness and palpitations that lasted for about a minute.  She is taking Eliquis twice a day.  Plan to continue home health physical therapy for 2 more weeks.  She will then transition to home exercise program or go to an outpatient physical therapy facility.  Follow-up in 6 weeks for clinical recheck.  Follow-Up Instructions: No follow-ups on file.   Orders:  No orders of the defined types were placed in this encounter.  No orders of the defined types were placed in this encounter.   Imaging: No results found.  PMFS History: Patient Active Problem List   Diagnosis Date Noted  . S/P total knee arthroplasty, right 11/16/2019  . Pica 10/29/2019  . Surgical counseling visit 10/02/2019  . Blood in urine 07/19/2019  . Hypothyroidism 07/19/2019  . Nausea 07/19/2019  . Sinus bradycardia 07/19/2019  . Vaginal irritation 07/19/2019  . Screening mammogram, encounter for 07/12/2019  . Hypertension associated  with diabetes ([place]) 07/11/2018  . Dyslipidemia associated with type 2 diabetes mellitus ([place]) 07/11/2018  . Short bowel syndrome 07/11/2018  . Peripheral autonomic neuropathy due to diabetes mellitus ([place]) 07/11/2018  . Status post left knee replacement 07/11/2018  . Major depression in remission ([place]) 07/07/2018  . Primary osteoarthritis involving multiple joints 07/07/2018  . Coronary artery disease involving native coronary artery of native heart without angina pectoris 07/07/2018  . Paroxysmal atrial fibrillation ([place]) 05/11/2018  . Mild cognitive impairment 04/30/2018  . Generalized abdominal pain 02/05/2018  . Spigelian hernia 02/05/2018  . Venous stasis dermatitis of right lower extremity 01/02/2018  . Genetic testing 11/24/2017  . Family history of colon cancer   . Dyslipidemia 04/15/2017  . Gastroesophageal reflux disease without esophagitis 04/15/2017  . Umbilical hernia without obstruction and without gangrene 04/15/2017  . B12 deficiency 12/13/2016  . Malabsorption 03/12/2016  . Tubular adenoma of colon 03/09/2016  . Vitamin D deficiency 03/09/2016  . Fecal incontinence 08/02/2015  . Stress 05/24/2015  . S/P bariatric surgery-duodenal switch with sleeve gastrectomy 12/04/2013  . Nonobstructive CAD  08/26/2013  . Iron deficiency anemia 06/19/2012  . Low back pain 03/05/2012  . PSORIASIS 05/28/2010  . HYPERCHOLESTEROLEMIA 03/20/2010  . Peripheral neuropathy 01/12/2009  . Type 2 diabetes, controlled, with neuropathy ([place]) 02/18/2008  . Chronic depression 10/06/2007  . OBSTRUCTIVE SLEEP APNEA 06/12/2007  . GOITER, MULTINODULAR  05/13/2007   Past Medical History:  Diagnosis Date  . Allergy   . Anemia   . ANEMIA, PERNICIOUS, HX OF 05/13/2007  . Anxiety   . Arthritis   . Arthritis of knee 06/30/2018  . ASYMPTOMATIC POSTMENOPAUSAL STATUS 02/18/2008  . BACK PAIN, LUMBAR 11/19/2007  . Bowel incontinence   . Cataract   . Cellulitis of left lower leg 10/28/2013  . Cellulitis  of leg, right 10/11/2010  . Chronic dermatitis of hands 05/02/2009  . DEPRESSION, CHRONIC 10/06/2007  . Diabetes mellitus without complication ([place])   . DIABETES MELLITUS, WITH NEUROLOGICAL COMPLICATIONS 0/12/1217  . Diabetic neuropathy ([place])   . DYSLIPIDEMIA 05/13/2007  . Essential hypertension 03/20/2010  . Family history of colon cancer   . Fecal soiling 06/28/2014  . Frozen shoulder    Lt  . Full dentures   . GERD (gastroesophageal reflux disease)   . GI bleed 03/15/2016  . GOITER, MULTINODULAR 05/13/2007  . Headache   . History of blood transfusion   . Hx of colonic polyps 12/04/2010  . HYPERCHOLESTEROLEMIA 03/20/2010  . HYPERTENSION 03/20/2010  . Lower back pain   . NASH (nonalcoholic steatohepatitis)   . OA (osteoarthritis)   . OBSTRUCTIVE SLEEP APNEA 06/12/2007   uses CPAP.  Marland Kitchen PERIPHERAL NEUROPATHY 01/12/2009  . Peripheral vascular disease ([place])   . Pneumonia   . Primary osteoarthritis of left knee 04/23/2011   Previously seen by Dr. Alphonzo Severance. Will plan on repeat referral after trial injection.     Marland Kitchen PSORIASIS 05/28/2010  . Rectal prolapse 05/24/2015  . Rectovaginal fistula 05/25/2014  . Renal insufficiency    stage 1 kd  . Sleep apnea   . Unilateral primary osteoarthritis, left knee   . UNSPECIFIED VENOUS INSUFFICIENCY 05/28/2010  . UNSPECIFIED VENOUS INSUFFICIENCY 05/28/2010   Patient with frequent ulceration related to venous stasis-seen at wound care. Edema and Venous stasis changes due to this . Echo 04/16/11 with EF 60%. No signs diastolic dysfunction.       . Wears glasses     Family History  Problem Relation Age of Onset  . Hyperlipidemia Father   . Hypertension Father   . Cirrhosis Father   . Colon cancer Father 59       d. 72  . Colon cancer Sister 36       d. 64  . Bipolar disorder Sister   . Aneurysm Mother        brain  . Cerebral aneurysm Mother   . Colon cancer Sister 28       d. 85  . Bipolar disorder Sister   . Cancer Paternal Aunt        NOS, ?  colon  . Congestive Heart Failure Maternal Grandmother   . Drug abuse Neg Hx   . CAD Neg Hx   . Stomach cancer Neg Hx     Past Surgical History:  Procedure Laterality Date  . CATARACT EXTRACTION W/ INTRAOCULAR LENS  IMPLANT, BILATERAL    . COLONOSCOPY    . ELECTROCARDIOGRAM  04/16/2006  . EXAMINATION UNDER ANESTHESIA N/A 06/29/2014   Procedure: EXAM UNDER ANESTHESIA;  Surgeon: Janyth Contes, MD;  Location: [facility];  Service: Gynecology;  Laterality: N/A;  . Exercise myoview  01/24/2005  . FLEXIBLE SIGMOIDOSCOPY    . GASTRIC BYPASS  11/09/2013  . GASTRIC BYPASS    . JOINT REPLACEMENT    . KNEE ARTHROSCOPY  2003   right  . LAPAROSCOPY N/A 03/12/2016   Procedure: LAPAROSCOPIC ANASTOMOSIS OF INTESTINE (  ENTEROENTEROSTOMY);  Surgeon: Ladora Daniel, MD;  Location: [facility];  Service: General;  Laterality: N/A;  . LEG SURGERY Left    metal and pins in lower left leg  . mrsa Right    arm  . MULTIPLE TOOTH EXTRACTIONS    . SHOULDER ARTHROSCOPY W/ ROTATOR CUFF REPAIR Right   . stab phlebectomy  Right 02/10/2019   stab phlebectomy > 20 incisions right leg by [name] MD   . TONSILLECTOMY    . TOTAL KNEE ARTHROPLASTY Left 06/30/2018   Procedure: LEFT TOTAL KNEE ARTHROPLASTY;  Surgeon: Meredith Pel, MD;  Location: [place];  Service: Orthopedics;  Laterality: Left;  . TOTAL KNEE ARTHROPLASTY Right 11/16/2019  . TOTAL KNEE ARTHROPLASTY Right 11/16/2019   Procedure: RIGHT TOTAL KNEE ARTHROPLASTY-CEMENTED;  Surgeon: Meredith Pel, MD;  Location: [place];  Service: Orthopedics;  Laterality: Right;  Marland Kitchen VARICOSE VEIN SURGERY     Remotef   Social History   Occupational History  . Occupation: Unemployed    Comment: disabled  Tobacco Use  . Smoking status: Former Smoker    Packs/day: 2.00    Years: 24.00    Pack years: 48.00    Types: Cigarettes    Start date: 06/24/1970    Quit date: 08/06/1994    Years since quitting: 25.4  . Smokeless tobacco: Never Used  Vaping Use  .  Vaping Use: Never used  Substance and Sexual Activity  . Alcohol use: No    Alcohol/week: 0.0 standard drinks  . Drug use: No  . Sexual activity: Not Currently

## 2019-12-28 DIAGNOSIS — E1122 Type 2 diabetes mellitus with diabetic chronic kidney disease: Secondary | ICD-10-CM | POA: Diagnosis not present

## 2019-12-28 DIAGNOSIS — E1142 Type 2 diabetes mellitus with diabetic polyneuropathy: Secondary | ICD-10-CM | POA: Diagnosis not present

## 2019-12-28 DIAGNOSIS — E1151 Type 2 diabetes mellitus with diabetic peripheral angiopathy without gangrene: Secondary | ICD-10-CM | POA: Diagnosis not present

## 2019-12-28 DIAGNOSIS — I2510 Atherosclerotic heart disease of native coronary artery without angina pectoris: Secondary | ICD-10-CM | POA: Diagnosis not present

## 2019-12-28 DIAGNOSIS — N181 Chronic kidney disease, stage 1: Secondary | ICD-10-CM | POA: Diagnosis not present

## 2019-12-28 DIAGNOSIS — I872 Venous insufficiency (chronic) (peripheral): Secondary | ICD-10-CM | POA: Diagnosis not present

## 2019-12-28 DIAGNOSIS — I129 Hypertensive chronic kidney disease with stage 1 through stage 4 chronic kidney disease, or unspecified chronic kidney disease: Secondary | ICD-10-CM | POA: Diagnosis not present

## 2019-12-28 DIAGNOSIS — Z471 Aftercare following joint replacement surgery: Secondary | ICD-10-CM | POA: Diagnosis not present

## 2019-12-28 DIAGNOSIS — I480 Paroxysmal atrial fibrillation: Secondary | ICD-10-CM | POA: Diagnosis not present

## 2019-12-29 ENCOUNTER — Ambulatory Visit (INDEPENDENT_AMBULATORY_CARE_PROVIDER_SITE_OTHER): Payer: Medicare HMO | Admitting: Internal Medicine

## 2019-12-29 ENCOUNTER — Encounter: Payer: Self-pay | Admitting: Internal Medicine

## 2019-12-29 ENCOUNTER — Other Ambulatory Visit: Payer: Self-pay

## 2019-12-29 VITALS — BP 186/96 | HR 89 | Ht 68.0 in | Wt 233.0 lb

## 2019-12-29 DIAGNOSIS — R001 Bradycardia, unspecified: Secondary | ICD-10-CM | POA: Diagnosis not present

## 2019-12-29 DIAGNOSIS — I480 Paroxysmal atrial fibrillation: Secondary | ICD-10-CM | POA: Diagnosis not present

## 2019-12-29 DIAGNOSIS — I119 Hypertensive heart disease without heart failure: Secondary | ICD-10-CM | POA: Diagnosis not present

## 2019-12-29 DIAGNOSIS — D6869 Other thrombophilia: Secondary | ICD-10-CM | POA: Diagnosis not present

## 2019-12-29 MED ORDER — SPIRONOLACTONE 25 MG PO TABS
25.0000 mg | ORAL_TABLET | Freq: Every day | ORAL | 3 refills | Status: DC
Start: 2019-12-29 — End: 2020-10-16

## 2019-12-29 NOTE — Progress Notes (Signed)
PCP: Daisy Floro, DO Primary Cardiologist: previously Dr Genene Churn [facility]) Primary EP: Dr Rayann Heman  [name] is a 64 y.o. female who presents today for routine electrophysiology followup.  I recently saw her post operatively after knee surgery with bradycardia.  Since her knee surgery, the patient reports doing reasonably well.  She is making good progress.  She is persistently in afib.  She has had recurrent symptoms of dizziness and fatigue with presyncope similar to her prior bradycardia events.  + stable edema. Today, she denies symptoms of palpitations, chest pain, shortness of breath,  or syncope.  The patient is otherwise without complaint today.   Past Medical History:  Diagnosis Date  . Allergy   . Anemia   . ANEMIA, PERNICIOUS, HX OF 05/13/2007  . Anxiety   . Arthritis   . Arthritis of knee 06/30/2018  . ASYMPTOMATIC POSTMENOPAUSAL STATUS 02/18/2008  . BACK PAIN, LUMBAR 11/19/2007  . Bowel incontinence   . Cataract   . Cellulitis of left lower leg 10/28/2013  . Cellulitis of leg, right 10/11/2010  . Chronic dermatitis of hands 05/02/2009  . DEPRESSION, CHRONIC 10/06/2007  . Diabetes mellitus without complication ([place])   . DIABETES MELLITUS, WITH NEUROLOGICAL COMPLICATIONS 09/06/4006  . Diabetic neuropathy ([place])   . DYSLIPIDEMIA 05/13/2007  . Essential hypertension 03/20/2010  . Family history of colon cancer   . Fecal soiling 06/28/2014  . Frozen shoulder    Lt  . Full dentures   . GERD (gastroesophageal reflux disease)   . GI bleed 03/15/2016  . GOITER, MULTINODULAR 05/13/2007  . Headache   . History of blood transfusion   . Hx of colonic polyps 12/04/2010  . HYPERCHOLESTEROLEMIA 03/20/2010  . HYPERTENSION 03/20/2010  . Lower back pain   . NASH (nonalcoholic steatohepatitis)   . OA (osteoarthritis)   . OBSTRUCTIVE SLEEP APNEA 06/12/2007   uses CPAP.  Marland Kitchen PERIPHERAL NEUROPATHY 01/12/2009  . Peripheral vascular disease ([place])   . Pneumonia   . Primary  osteoarthritis of left knee 04/23/2011   Previously seen by Dr. Alphonzo Severance. Will plan on repeat referral after trial injection.     Marland Kitchen PSORIASIS 05/28/2010  . Rectal prolapse 05/24/2015  . Rectovaginal fistula 05/25/2014  . Renal insufficiency    stage 1 kd  . Sleep apnea   . Unilateral primary osteoarthritis, left knee   . UNSPECIFIED VENOUS INSUFFICIENCY 05/28/2010  . UNSPECIFIED VENOUS INSUFFICIENCY 05/28/2010   Patient with frequent ulceration related to venous stasis-seen at wound care. Edema and Venous stasis changes due to this . Echo 04/16/11 with EF 60%. No signs diastolic dysfunction.       . Wears glasses    Past Surgical History:  Procedure Laterality Date  . CATARACT EXTRACTION W/ INTRAOCULAR LENS  IMPLANT, BILATERAL    . COLONOSCOPY    . ELECTROCARDIOGRAM  04/16/2006  . EXAMINATION UNDER ANESTHESIA N/A 06/29/2014   Procedure: EXAM UNDER ANESTHESIA;  Surgeon: Janyth Contes, MD;  Location: [facility];  Service: Gynecology;  Laterality: N/A;  . Exercise myoview  01/24/2005  . FLEXIBLE SIGMOIDOSCOPY    . GASTRIC BYPASS  11/09/2013  . GASTRIC BYPASS    . JOINT REPLACEMENT    . KNEE ARTHROSCOPY  2003   right  . LAPAROSCOPY N/A 03/12/2016   Procedure: LAPAROSCOPIC ANASTOMOSIS OF INTESTINE (ENTEROENTEROSTOMY);  Surgeon: Ladora Daniel, MD;  Location: [facility];  Service: General;  Laterality: N/A;  . LEG SURGERY Left    metal and pins in lower left leg  .  mrsa Right    arm  . MULTIPLE TOOTH EXTRACTIONS    . SHOULDER ARTHROSCOPY W/ ROTATOR CUFF REPAIR Right   . stab phlebectomy  Right 02/10/2019   stab phlebectomy > 20 incisions right leg by [name] MD   . TONSILLECTOMY    . TOTAL KNEE ARTHROPLASTY Left 06/30/2018   Procedure: LEFT TOTAL KNEE ARTHROPLASTY;  Surgeon: Meredith Pel, MD;  Location: [place];  Service: Orthopedics;  Laterality: Left;  . TOTAL KNEE ARTHROPLASTY Right 11/16/2019  . TOTAL KNEE ARTHROPLASTY Right 11/16/2019   Procedure: RIGHT TOTAL KNEE  ARTHROPLASTY-CEMENTED;  Surgeon: Meredith Pel, MD;  Location: [place];  Service: Orthopedics;  Laterality: Right;  Marland Kitchen VARICOSE VEIN SURGERY     Remotef    ROS- all systems are reviewed and negatives except as per HPI above  Current Outpatient Medications  Medication Sig Dispense Refill  . amLODipine (NORVASC) 10 MG tablet Take 1 tablet (10 mg total) by mouth daily. 90 tablet 0  . apixaban (ELIQUIS) 5 MG TABS tablet Take 1 tablet (5 mg total) by mouth 2 (two) times daily. 60 tablet 0  . atorvastatin (LIPITOR) 40 MG tablet Take 1 tablet (40 mg total) by mouth daily. 90 tablet 0  . celecoxib (CELEBREX) 200 MG capsule Take 1 capsule (200 mg total) by mouth 2 (two) times daily. 60 capsule 0  . ferrous sulfate 324 (65 Fe) MG TBEC Take 1 tablet (325 mg total) by mouth every other day. 30 tablet 6  . gabapentin (NEURONTIN) 100 MG capsule Take 100 mg by mouth every morning.    . gabapentin (NEURONTIN) 300 MG capsule Take 300 mg by mouth at bedtime.    Marland Kitchen glucose blood (ACCU-CHEK AVIVA PLUS) test strip Use to check sugar once daily in the morning 100 each 0  . lisinopril-hydrochlorothiazide (ZESTORETIC) 20-25 MG tablet Take 1 tablet by mouth daily. 90 tablet 3  . metFORMIN (GLUCOPHAGE) 1000 MG tablet Take 1 tablet (1,000 mg total) by mouth daily with breakfast. 90 tablet 2  . methocarbamol (ROBAXIN) 500 MG tablet Take 1 tablet (500 mg total) by mouth every 8 (eight) hours as needed for muscle spasms. 30 tablet 0  . methocarbamol (ROBAXIN) 500 MG tablet TAKE 1 TABLET BY MOUTH EVERY 6 HOURS AS NEEDED FOR MUSCLE SPASM 120 tablet 3  . multivitamin-iron-minerals-folic acid (CENTRUM) chewable tablet Chew 1 tablet by mouth daily.     . NON FORMULARY Diet type:  NAS    . oxyCODONE (OXY IR/ROXICODONE) 5 MG immediate release tablet Take 1 tablet (5 mg total) by mouth every 4 (four) hours as needed for severe pain. 35 tablet 0  . sertraline (ZOLOFT) 100 MG tablet Take 1 tablet (100 mg total) by mouth daily. 90  tablet 1  . Teduglutide, rDNA, (GATTEX) 5 MG KIT Inject 5 mg into the skin daily.     No current facility-administered medications for this visit.    Physical Exam: Vitals:   12/29/19 1522  BP: (!) 186/96  Pulse: 89  SpO2: 95%  Weight: 233 lb (105.7 kg)  Height: 5' 8"  (1.727 m)    GEN- The patient is overweight, chronically ill appearing, alert and oriented x 3 today.   Head- normocephalic, atraumatic Eyes-  Sclera clear, conjunctiva pink Ears- hearing intact Oropharynx- clear Lungs-  normal work of breathing Heart- irregular rate and rhythm  GI- soft  Extremities- no clubbing, cyanosis, + edema with venous stasis changes  Wt Readings from Last 3 Encounters:  12/29/19 233 lb (  105.7 kg)  11/16/19 233 lb (105.7 kg)  11/12/19 233 lb 14.4 oz (106.1 kg)    EKG tracing ordered today is personally reviewed and shows afib, LBBB (new),  V rates 80s  Assessment and Plan:  1. Bradycardia due to second degree AV block Occurred post operatively She has ongoing symptomatic bradycardia. I would therefore recommend pacemaker implantation at this time. We discussed leadless and transvenous pacing today.  I think she would be a good candidate for Leadless II pacing trial.  Risks, benefits, alternatives to pacemaker implantation (both leadless and traditional) were discussed in detail with the patient today. The patient understands that the risks include but are not limited to bleeding, infection, pneumothorax, perforation, tamponade, vascular damage, renal failure, MI, stroke, death,  and lead dislodgement and wishes to proceed. We will therefore schedule the procedure at the next available time.    Hold eliquis 24 hours prior to PPM imlant  2. afib (persistent) Rate controlled chads2vasc score is 5.  She is on eliquis  3. Hypertensive cardiovascular disease BP is quite high Start spironolactone 56m daily We will ask our pharmacy team to assist with BP management  Risks, benefits  and potential toxicities for medications prescribed and/or refilled reviewed with patient today.   JThompson GrayerMD, FT[facility]7/12/2019 3:23 PM

## 2019-12-29 NOTE — Patient Instructions (Addendum)
Medication Instructions:  Your physician has recommended you make the following change in your medication:  1 Start taking spironolactone 25 mg --1 tablet by mouth daily.   Please schedule Pharmacy visit in 2 to 4 wks for BP management.    Lab Work: None ordered.  If you have labs (blood work) drawn today and your tests are completely normal, you will receive your results only by:  [place] (if you have MyChart) OR  A paper copy in the mail If you have any lab test that is abnormal or we need to change your treatment, we will call you to review the results.  Testing/Procedures: None ordered.  Follow-Up: At [facility], you and your health needs are our priority.  As part of our continuing mission to provide you with exceptional heart care, we have created designated Provider Care Teams.  These Care Teams include your primary Cardiologist (physician) and Advanced Practice Providers (APPs -  Physician Assistants and Nurse Practitioners) who all work together to provide you with the care you need, when you need it.  We recommend signing up for the patient portal called "MyChart".  Sign up information is provided on this After Visit Summary.  MyChart is used to connect with patients for Virtual Visits (Telemedicine).  Patients are able to view lab/test results, encounter notes, upcoming appointments, etc.  Non-urgent messages can be sent to your provider as well.   To learn more about what you can do with MyChart, go to [email or web address].    Your next appointment:   You will be contacted to discuss and schedule pacemaker implant.      Leadless Pacemaker Implantation  Leadless pacemaker implantation is a procedure to place a small electronic device (leadless cardiac pacemaker) inside your heart to regulate your heartbeat. If your heart rhythm is too slow, this device will send electrical signals to your heart muscle and help your heart beat normally. Unlike a traditional  pacemaker, this device does not require electric wires (leads) that run into your heart. The leadless device is much smaller, and has fewer activity restrictions than a traditional pacemaker. In this procedure, the leadless pacemaker is implanted into the right side of your heart. This is done using a long, thin tube (catheter) that is inserted into a large vein in your groin (femoral vein) that leads back to your heart. Tell your health care provider about:  Any allergies you have.  All medicines you are taking, including vitamins, herbs, eye drops, creams, and over-the-counter medicines.  Any problems you or family members have had with anesthetic medicines.  Any blood disorders you have.  Any surgeries you have had.  Any medical conditions you have.  Whether you are pregnant or may be pregnant. What are the risks? Generally, this is a safe procedure. However, problems may occur, including:  Infection.  Bleeding.  Allergic reactions to medicines or dyes.  Damage to nearby structures or organs.  The device coming loose (dislodgement) from inside your heart. What happens before the procedure?  Ask your health care provider about: ? Changing or stopping your regular medicines. This is especially important if you are taking diabetes medicines or blood thinners. ? Taking medicines such as aspirin and ibuprofen. These medicines can thin your blood. Do not take these medicines unless your health care provider tells you to take them. ? Taking over-the-counter medicines, vitamins, herbs, and supplements.  Follow instructions from your health care provider about eating or drinking restrictions.  Plan to have someone take  you home from the hospital.  Ask your health care provider what steps will be taken to help prevent infection. These may include: ? Removing hair at the surgery site. ? Washing skin with a germ-killing soap. What happens during the procedure?  An IV will be  inserted into one of your veins.  You will be given: ? A medicine to help you relax (sedative). ? A medicine to numb the area above the femoral vein where the catheter will be inserted (local anesthetic).  A tiny incision will be made in the femoral vein. A catheter with the pacemaker attached to it will be inserted into the vein and advanced toward your heart. Your health care provider will use X-ray imaging to guide the catheter into your heart.  When the catheter is in the right spot, the pacemaker at the end of the catheter will be attached to the inside muscle of your heart.  The pacemaker will be tested to make sure it has attached securely to your heart and is working.  The pacemaker will be released from the catheter, and the catheter will be removed.  After the catheter is removed, pressure will be placed over the groin incision, and a bandage (dressing) will be applied. In some cases, the incision may be closed with tape, glue, or stitches (sutures). The procedure may vary among health care providers and hospitals. What happens after the procedure?  Your blood pressure, heart rate, breathing rate, and blood oxygen level will be monitored until you leave the hospital.  You will need to lie flat in bed for several hours to prevent bleeding from the incision site.  You may spend a night in the hospital to make sure your pacemaker is working well.  Your health care provider will give you instructions for taking care of yourself at home after your procedure.  Do not drive for 24 hours if you were given a sedative during your procedure. Summary  Leadless pacemaker implantation is a procedure to place a small electronic device (leadless cardiac pacemaker) inside your heart to regulate your heartbeat.  If your heart rhythm becomes too slow, this device will send electrical signals to your heart muscle and help your heart beat normally.  Your health care provider will use a flexible  tube (catheter) to insert the pacemaker into a vein in your groin and move it to your heart. Once the pacemaker is attached to your heart, the catheter will be removed.  You may spend a night in the hospital to make sure your pacemaker is working well. This information is not intended to replace advice given to you by your health care provider. Make sure you discuss any questions you have with your health care provider. Document Revised: 05/11/2018 Document Reviewed: 02/12/2018 Elsevier Patient Education  2020 [employer].

## 2019-12-31 DIAGNOSIS — I480 Paroxysmal atrial fibrillation: Secondary | ICD-10-CM | POA: Diagnosis not present

## 2019-12-31 DIAGNOSIS — N181 Chronic kidney disease, stage 1: Secondary | ICD-10-CM | POA: Diagnosis not present

## 2019-12-31 DIAGNOSIS — E1151 Type 2 diabetes mellitus with diabetic peripheral angiopathy without gangrene: Secondary | ICD-10-CM | POA: Diagnosis not present

## 2019-12-31 DIAGNOSIS — I2510 Atherosclerotic heart disease of native coronary artery without angina pectoris: Secondary | ICD-10-CM | POA: Diagnosis not present

## 2019-12-31 DIAGNOSIS — I129 Hypertensive chronic kidney disease with stage 1 through stage 4 chronic kidney disease, or unspecified chronic kidney disease: Secondary | ICD-10-CM | POA: Diagnosis not present

## 2019-12-31 DIAGNOSIS — Z471 Aftercare following joint replacement surgery: Secondary | ICD-10-CM | POA: Diagnosis not present

## 2019-12-31 DIAGNOSIS — E1142 Type 2 diabetes mellitus with diabetic polyneuropathy: Secondary | ICD-10-CM | POA: Diagnosis not present

## 2019-12-31 DIAGNOSIS — E1122 Type 2 diabetes mellitus with diabetic chronic kidney disease: Secondary | ICD-10-CM | POA: Diagnosis not present

## 2019-12-31 DIAGNOSIS — I872 Venous insufficiency (chronic) (peripheral): Secondary | ICD-10-CM | POA: Diagnosis not present

## 2020-01-02 DIAGNOSIS — I872 Venous insufficiency (chronic) (peripheral): Secondary | ICD-10-CM | POA: Diagnosis not present

## 2020-01-02 DIAGNOSIS — E1151 Type 2 diabetes mellitus with diabetic peripheral angiopathy without gangrene: Secondary | ICD-10-CM | POA: Diagnosis not present

## 2020-01-02 DIAGNOSIS — I480 Paroxysmal atrial fibrillation: Secondary | ICD-10-CM | POA: Diagnosis not present

## 2020-01-02 DIAGNOSIS — I2510 Atherosclerotic heart disease of native coronary artery without angina pectoris: Secondary | ICD-10-CM | POA: Diagnosis not present

## 2020-01-02 DIAGNOSIS — I129 Hypertensive chronic kidney disease with stage 1 through stage 4 chronic kidney disease, or unspecified chronic kidney disease: Secondary | ICD-10-CM | POA: Diagnosis not present

## 2020-01-02 DIAGNOSIS — Z471 Aftercare following joint replacement surgery: Secondary | ICD-10-CM | POA: Diagnosis not present

## 2020-01-02 DIAGNOSIS — E1142 Type 2 diabetes mellitus with diabetic polyneuropathy: Secondary | ICD-10-CM | POA: Diagnosis not present

## 2020-01-02 DIAGNOSIS — E1122 Type 2 diabetes mellitus with diabetic chronic kidney disease: Secondary | ICD-10-CM | POA: Diagnosis not present

## 2020-01-02 DIAGNOSIS — N181 Chronic kidney disease, stage 1: Secondary | ICD-10-CM | POA: Diagnosis not present

## 2020-01-03 ENCOUNTER — Telehealth: Payer: Self-pay | Admitting: *Deleted

## 2020-01-03 ENCOUNTER — Telehealth: Payer: Self-pay | Admitting: Internal Medicine

## 2020-01-03 DIAGNOSIS — R001 Bradycardia, unspecified: Secondary | ICD-10-CM

## 2020-01-03 NOTE — Telephone Encounter (Signed)
I have called her again but had to talk to the same family member. He forgot my cell number so I gave it to him again. He is working outside in the yard and she is in the house. Hopefully she will call back soon

## 2020-01-03 NOTE — Telephone Encounter (Signed)
Patient is calling to follow up in regards to pacemaker. She states during appointment on 12/29/19 with JA she was told she would be contacted to discuss new pacemaker. Please call.

## 2020-01-03 NOTE — Telephone Encounter (Signed)
Call placed to Pt.  Pt is interested in scheduling leadless pacemaker.  Advised would discuss scheduling with Dr. Rayann Heman and let her know what date he prefers.  Advised would be August 17, 24 or 26  Pt indicates understanding.  Will await call back.

## 2020-01-03 NOTE — Telephone Encounter (Signed)
Spoke with patient and answered her questions regarding Leadless- AVEIR pacemaker and the associated research study. [name] has also read the handout that was given to her by Dr. Rayann Heman. She has agreed to proceed with surgery scheduling. I have sent a staff message to Dr. Jackalyn Lombard nurse to call patient to discuss a date that is convenient for everyone.

## 2020-01-06 ENCOUNTER — Encounter: Payer: Self-pay | Admitting: *Deleted

## 2020-01-06 ENCOUNTER — Telehealth: Payer: Self-pay | Admitting: *Deleted

## 2020-01-06 NOTE — Telephone Encounter (Signed)
Ortho bundle call completed.

## 2020-01-06 NOTE — Care Plan (Signed)
Received call from patient stating she has been attempting to call RNCM. Noted that RNCM was out of office last week, but had no calls from her. Patient was calling Almedia Balls, surgery scheduler instead of CM. Discussed that after her last visit, CM left message on her home phone and she should've been informed by HHPT as well, that she will only have 2 more weeks of HHPT as she will no longer be appropriate for [facility] per their guidelines and per MD instructions. She states that she has 1 more week of therapy as her treating therapist did not see her this week. Patient is now requesting OPPT as she refused previously. RNCM informed she would discuss with Dr. Marlou Sa, but would also instruct HHPT to discharge from services as this is what was ordered through the end of this week. Will contact back after speaking with MD regarding how he would like to proceed.

## 2020-01-06 NOTE — Telephone Encounter (Signed)
Patient has been scheduled for a leadless pacemaker  Scheduled labs and Covid screening  Work up completed

## 2020-01-07 ENCOUNTER — Telehealth: Payer: Self-pay | Admitting: *Deleted

## 2020-01-07 NOTE — Telephone Encounter (Signed)
Ortho bundle call to update that no formal OPPT will be needed after her HHPT discharge date on 01/10/20. Patient doing extremely well with therapy and has met all goals.

## 2020-01-10 DIAGNOSIS — I872 Venous insufficiency (chronic) (peripheral): Secondary | ICD-10-CM | POA: Diagnosis not present

## 2020-01-10 DIAGNOSIS — N181 Chronic kidney disease, stage 1: Secondary | ICD-10-CM | POA: Diagnosis not present

## 2020-01-10 DIAGNOSIS — I2510 Atherosclerotic heart disease of native coronary artery without angina pectoris: Secondary | ICD-10-CM | POA: Diagnosis not present

## 2020-01-10 DIAGNOSIS — E1151 Type 2 diabetes mellitus with diabetic peripheral angiopathy without gangrene: Secondary | ICD-10-CM | POA: Diagnosis not present

## 2020-01-10 DIAGNOSIS — Z471 Aftercare following joint replacement surgery: Secondary | ICD-10-CM | POA: Diagnosis not present

## 2020-01-10 DIAGNOSIS — I129 Hypertensive chronic kidney disease with stage 1 through stage 4 chronic kidney disease, or unspecified chronic kidney disease: Secondary | ICD-10-CM | POA: Diagnosis not present

## 2020-01-10 DIAGNOSIS — E1122 Type 2 diabetes mellitus with diabetic chronic kidney disease: Secondary | ICD-10-CM | POA: Diagnosis not present

## 2020-01-10 DIAGNOSIS — E1142 Type 2 diabetes mellitus with diabetic polyneuropathy: Secondary | ICD-10-CM | POA: Diagnosis not present

## 2020-01-10 DIAGNOSIS — I480 Paroxysmal atrial fibrillation: Secondary | ICD-10-CM | POA: Diagnosis not present

## 2020-01-12 ENCOUNTER — Other Ambulatory Visit: Payer: Self-pay

## 2020-01-12 ENCOUNTER — Ambulatory Visit: Payer: Medicare HMO

## 2020-01-12 ENCOUNTER — Ambulatory Visit (INDEPENDENT_AMBULATORY_CARE_PROVIDER_SITE_OTHER): Payer: Medicare HMO | Admitting: Pharmacist

## 2020-01-12 ENCOUNTER — Encounter: Payer: Self-pay | Admitting: Pharmacist

## 2020-01-12 VITALS — BP 162/84 | HR 72

## 2020-01-12 DIAGNOSIS — I119 Hypertensive heart disease without heart failure: Secondary | ICD-10-CM

## 2020-01-12 NOTE — Progress Notes (Signed)
Patient ID: [name]                 DOB: 06/25/1955                      MRN: [id number]     HPI: [name] is a 64 y.o. female referred by Dr. Rayann Heman to HTN clinic. PMH is significant for A Fib, CAD, DM, OSA, and HTN.  Seen by Dr. Rayann Heman on 12/29/19 who has scheduled patient for pacemaker implantation and started patient on spironolactone 25 mg daily.  Needs updated BMP.  Patient presents today in good spirits.  Is not checking BP at home.  Has a wrist cuff and reports her daughter is a Marine scientist. Upon questioning, has not picked up her spironolactone yet.  Reports stress at home from taking care of husband who has MS.  Has many questions regarding her pacemaker surgery.  Current HTN meds: amlodipine 10 mg, lisinopril/hctz 20/25 daily,  Previously tried: Only current meds BP goal: <130/80  Social History: former smoker   Home BP readings: N/A  Wt Readings from Last 3 Encounters:  12/29/19 233 lb (105.7 kg)  11/16/19 233 lb (105.7 kg)  11/12/19 233 lb 14.4 oz (106.1 kg)   BP Readings from Last 3 Encounters:  12/29/19 (!) 186/96  11/23/19 (!) 120/56  11/15/19 (!) 155/83   Pulse Readings from Last 3 Encounters:  12/29/19 89  11/23/19 (!) 56  11/15/19 (!) 56    Renal function: CrCl cannot be calculated (Patient's most recent lab result is older than the maximum 21 days allowed.).  Past Medical History:  Diagnosis Date   Allergy    Anemia    ANEMIA, PERNICIOUS, HX OF 05/13/2007   Anxiety    Arthritis    ASYMPTOMATIC POSTMENOPAUSAL STATUS 02/18/2008   BACK PAIN, LUMBAR 11/19/2007   Bowel incontinence    Cataract    Cellulitis of left lower leg 10/28/2013   Cellulitis of leg, right 10/11/2010   Chronic dermatitis of hands 05/02/2009   DEPRESSION, CHRONIC 10/06/2007   Diabetes mellitus without complication ([place])    DIABETES MELLITUS, WITH NEUROLOGICAL COMPLICATIONS 4/56/2563   Diabetic neuropathy ([place])    DYSLIPIDEMIA 05/13/2007   Essential hypertension  03/20/2010   Frozen shoulder    Lt   Full dentures    GERD (gastroesophageal reflux disease)    GI bleed 03/15/2016   GOITER, MULTINODULAR 05/13/2007   Headache    History of blood transfusion    Hx of colonic polyps 12/04/2010   HYPERCHOLESTEROLEMIA 03/20/2010   HYPERTENSION 03/20/2010   Lower back pain    NASH (nonalcoholic steatohepatitis)    OA (osteoarthritis)    OBSTRUCTIVE SLEEP APNEA 06/12/2007   uses CPAP.   PERIPHERAL NEUROPATHY 01/12/2009   Peripheral vascular disease ([place])    Primary osteoarthritis of left knee 04/23/2011   Previously seen by Dr. Alphonzo Severance. Will plan on repeat referral after trial injection.      PSORIASIS 05/28/2010   Rectal prolapse 05/24/2015   Rectovaginal fistula 05/25/2014   Renal insufficiency    stage 1 kd   Unilateral primary osteoarthritis, left knee    UNSPECIFIED VENOUS INSUFFICIENCY 05/28/2010   Patient with frequent ulceration related to venous stasis-seen at wound care. Edema and Venous stasis changes due to this . Echo 04/16/11 with EF 60%. No signs diastolic dysfunction.        Wears glasses     Current Outpatient Medications on File Prior to Visit  Medication Sig Dispense Refill   amLODipine (NORVASC) 10 MG tablet Take 1 tablet (10 mg total) by mouth daily. 90 tablet 0   apixaban (ELIQUIS) 5 MG TABS tablet Take 1 tablet (5 mg total) by mouth 2 (two) times daily. 60 tablet 0   atorvastatin (LIPITOR) 40 MG tablet Take 1 tablet (40 mg total) by mouth daily. 90 tablet 0   celecoxib (CELEBREX) 200 MG capsule Take 1 capsule (200 mg total) by mouth 2 (two) times daily. 60 capsule 0   ferrous sulfate 324 (65 Fe) MG TBEC Take 1 tablet (325 mg total) by mouth every other day. 30 tablet 6   gabapentin (NEURONTIN) 100 MG capsule Take 100 mg by mouth every morning.     gabapentin (NEURONTIN) 300 MG capsule Take 300 mg by mouth at bedtime.     glucose blood (ACCU-CHEK AVIVA PLUS) test strip Use to check sugar once daily  in the morning 100 each 0   lisinopril-hydrochlorothiazide (ZESTORETIC) 20-25 MG tablet Take 1 tablet by mouth daily. 90 tablet 3   metFORMIN (GLUCOPHAGE) 1000 MG tablet Take 1 tablet (1,000 mg total) by mouth daily with breakfast. 90 tablet 2   methocarbamol (ROBAXIN) 500 MG tablet Take 1 tablet (500 mg total) by mouth every 8 (eight) hours as needed for muscle spasms. 30 tablet 0   methocarbamol (ROBAXIN) 500 MG tablet TAKE 1 TABLET BY MOUTH EVERY 6 HOURS AS NEEDED FOR MUSCLE SPASM 120 tablet 3   multivitamin-iron-minerals-folic acid (CENTRUM) chewable tablet Chew 1 tablet by mouth daily.      NON FORMULARY Diet type:  NAS     oxyCODONE (OXY IR/ROXICODONE) 5 MG immediate release tablet Take 1 tablet (5 mg total) by mouth every 4 (four) hours as needed for severe pain. 35 tablet 0   sertraline (ZOLOFT) 100 MG tablet Take 1 tablet (100 mg total) by mouth daily. 90 tablet 1   spironolactone (ALDACTONE) 25 MG tablet Take 1 tablet (25 mg total) by mouth daily. 90 tablet 3   Teduglutide, rDNA, (GATTEX) 5 MG KIT Inject 5 mg into the skin daily.     No current facility-administered medications on file prior to visit.    Allergies  Allergen Reactions   Cephalexin Hives and Rash    Denies Airway involvement   Latex Rash   Neomycin-Bacitracin Zn-Polymyx Rash   Tape Rash    Latex Prefers paper tape     Assessment/Plan:  1. Hypertension - Patient BP in room 162/84 which is above goal of <130/80.  However patient has not started spironolactone yet.  Advised patient to pick up her medication and begin it immediately since her BP is still elevated.  Scheduled BMP for 1 week to check kidney function and K.  Advised patient to contact Dr. Jackalyn Lombard nursing staff for pacemaker questions.  Recommended patient pick up [employer] blood pressure cuff at pharmacy when she picks up her spironolactone and begin home testing BP.  Patient voiced understanding.  Karren Cobble, PharmD, BCACP, [place] [address], [place],  [address] Phone: [phone]; Fax: [phone] 01/12/2020 10:32 AM

## 2020-01-12 NOTE — Patient Instructions (Signed)
It was great meeting you today  Continue your amlodipine and your lisinopril/hydrochlorothiazide and pick up the spironolactone from the pharmacy  Remember to come back in 1 week for bloodwork once you start the new medication  Please call with any questions  Karren Cobble, PharmD, [facility], [place] [address], [place], [place] [address] Phone: [phone]; Fax: [phone] 01/12/2020 10:05 AM

## 2020-01-18 ENCOUNTER — Encounter: Payer: Self-pay | Admitting: Family Medicine

## 2020-01-18 DIAGNOSIS — H5210 Myopia, unspecified eye: Secondary | ICD-10-CM | POA: Diagnosis not present

## 2020-01-18 LAB — HM DIABETES EYE EXAM

## 2020-01-19 ENCOUNTER — Other Ambulatory Visit: Payer: Self-pay

## 2020-01-19 ENCOUNTER — Other Ambulatory Visit: Payer: Medicare HMO | Admitting: *Deleted

## 2020-01-19 DIAGNOSIS — I119 Hypertensive heart disease without heart failure: Secondary | ICD-10-CM | POA: Diagnosis not present

## 2020-01-19 LAB — BASIC METABOLIC PANEL
BUN/Creatinine Ratio: 14 (ref 12–28)
BUN: 15 mg/dL (ref 8–27)
CO2: 24 mmol/L (ref 20–29)
Calcium: 11.7 mg/dL — ABNORMAL HIGH (ref 8.7–10.3)
Chloride: 103 mmol/L (ref 96–106)
Creatinine, Ser: 1.09 mg/dL — ABNORMAL HIGH (ref 0.57–1.00)
GFR calc Af Amer: 62 mL/min/{1.73_m2} (ref >59)
GFR calc non Af Amer: 53 mL/min/{1.73_m2} — ABNORMAL LOW (ref >59)
Glucose: 149 mg/dL — ABNORMAL HIGH (ref 65–99)
Potassium: 4.6 mmol/L (ref 3.5–5.2)
Sodium: 141 mmol/L (ref 134–144)

## 2020-01-25 ENCOUNTER — Other Ambulatory Visit: Payer: Self-pay | Admitting: Family Medicine

## 2020-01-25 DIAGNOSIS — E7800 Pure hypercholesterolemia, unspecified: Secondary | ICD-10-CM

## 2020-02-02 ENCOUNTER — Encounter: Payer: Self-pay | Admitting: Orthopedic Surgery

## 2020-02-02 ENCOUNTER — Ambulatory Visit (INDEPENDENT_AMBULATORY_CARE_PROVIDER_SITE_OTHER): Payer: Medicare HMO | Admitting: Orthopedic Surgery

## 2020-02-02 DIAGNOSIS — Z96651 Presence of right artificial knee joint: Secondary | ICD-10-CM

## 2020-02-02 NOTE — Progress Notes (Signed)
Post-Op Visit Note   Patient: [name]           Date of Birth: [birth year]           MRN: [id number] Visit Date: 02/02/2020 PCP: Daisy Floro, DO   Assessment & Plan:  Chief Complaint:  Chief Complaint  Patient presents with  . Right Knee - Routine Post Op   Visit Diagnoses:  1. S/P total knee arthroplasty, right     Plan: [name] is now almost 3 months out right total knee replacement.  She is doing well.  On exam she has 0-1 15 of flexion.  No effusion in the knee.  Negative calf tenderness negative Homans.  Some swelling in the leg is present but that is consistent with postop knee replacement.  The swelling is primarily knee distal.  She had the same thing on her other leg and her other leg now looks essentially normal.  I think that will be the same case for this right knee.  She is doing well and I will see her back as needed.  Follow-Up Instructions: Return if symptoms worsen or fail to improve.   Orders:  No orders of the defined types were placed in this encounter.  No orders of the defined types were placed in this encounter.   Imaging: No results found.  PMFS History: Patient Active Problem List   Diagnosis Date Noted  . S/P total knee arthroplasty, right 11/16/2019  . Pica 10/29/2019  . Surgical counseling visit 10/02/2019  . Blood in urine 07/19/2019  . Hypothyroidism 07/19/2019  . Nausea 07/19/2019  . Sinus bradycardia 07/19/2019  . Vaginal irritation 07/19/2019  . Screening mammogram, encounter for 07/12/2019  . Hypertension associated with diabetes ([place]) 07/11/2018  . Dyslipidemia associated with type 2 diabetes mellitus ([place]) 07/11/2018  . Short bowel syndrome 07/11/2018  . Peripheral autonomic neuropathy due to diabetes mellitus ([place]) 07/11/2018  . Status post left knee replacement 07/11/2018  . Major depression in remission ([place]) 07/07/2018  . Primary osteoarthritis involving multiple joints 07/07/2018  . Coronary artery disease  involving native coronary artery of native heart without angina pectoris 07/07/2018  . Paroxysmal atrial fibrillation ([place]) 05/11/2018  . Mild cognitive impairment 04/30/2018  . Generalized abdominal pain 02/05/2018  . Spigelian hernia 02/05/2018  . Venous stasis dermatitis of right lower extremity 01/02/2018  . Genetic testing 11/24/2017  . Family history of colon cancer   . Dyslipidemia 04/15/2017  . Gastroesophageal reflux disease without esophagitis 04/15/2017  . Umbilical hernia without obstruction and without gangrene 04/15/2017  . B12 deficiency 12/13/2016  . Malabsorption 03/12/2016  . Tubular adenoma of colon 03/09/2016  . Vitamin D deficiency 03/09/2016  . Fecal incontinence 08/02/2015  . Stress 05/24/2015  . S/P bariatric surgery-duodenal switch with sleeve gastrectomy 12/04/2013  . Nonobstructive CAD  08/26/2013  . Iron deficiency anemia 06/19/2012  . Low back pain 03/05/2012  . PSORIASIS 05/28/2010  . HYPERCHOLESTEROLEMIA 03/20/2010  . Peripheral neuropathy 01/12/2009  . Type 2 diabetes, controlled, with neuropathy ([place]) 02/18/2008  . Chronic depression 10/06/2007  . OBSTRUCTIVE SLEEP APNEA 06/12/2007  . GOITER, MULTINODULAR 05/13/2007   Past Medical History:  Diagnosis Date  . Allergy   . Anemia   . ANEMIA, PERNICIOUS, HX OF 05/13/2007  . Anxiety   . Arthritis   . ASYMPTOMATIC POSTMENOPAUSAL STATUS 02/18/2008  . BACK PAIN, LUMBAR 11/19/2007  . Bowel incontinence   . Cataract   . Cellulitis of left lower leg 10/28/2013  . Cellulitis of  leg, right 10/11/2010  . Chronic dermatitis of hands 05/02/2009  . DEPRESSION, CHRONIC 10/06/2007  . Diabetes mellitus without complication ([place])   . DIABETES MELLITUS, WITH NEUROLOGICAL COMPLICATIONS 02/22/5175  . Diabetic neuropathy ([place])   . DYSLIPIDEMIA 05/13/2007  . Essential hypertension 03/20/2010  . Frozen shoulder    Lt  . Full dentures   . GERD (gastroesophageal reflux disease)   . GI bleed 03/15/2016  . GOITER,  MULTINODULAR 05/13/2007  . Headache   . History of blood transfusion   . Hx of colonic polyps 12/04/2010  . HYPERCHOLESTEROLEMIA 03/20/2010  . HYPERTENSION 03/20/2010  . Lower back pain   . NASH (nonalcoholic steatohepatitis)   . OA (osteoarthritis)   . OBSTRUCTIVE SLEEP APNEA 06/12/2007   uses CPAP.  Marland Kitchen PERIPHERAL NEUROPATHY 01/12/2009  . Peripheral vascular disease ([place])   . Primary osteoarthritis of left knee 04/23/2011   Previously seen by Dr. Alphonzo Severance. Will plan on repeat referral after trial injection.     Marland Kitchen PSORIASIS 05/28/2010  . Rectal prolapse 05/24/2015  . Rectovaginal fistula 05/25/2014  . Renal insufficiency    stage 1 kd  . Unilateral primary osteoarthritis, left knee   . UNSPECIFIED VENOUS INSUFFICIENCY 05/28/2010   Patient with frequent ulceration related to venous stasis-seen at wound care. Edema and Venous stasis changes due to this . Echo 04/16/11 with EF 60%. No signs diastolic dysfunction.       . Wears glasses     Family History  Problem Relation Age of Onset  . Hyperlipidemia Father   . Hypertension Father   . Cirrhosis Father   . Colon cancer Father 7       d. 44  . Colon cancer Sister 80       d. 14  . Bipolar disorder Sister   . Aneurysm Mother        brain  . Cerebral aneurysm Mother   . Colon cancer Sister 24       d. 75  . Bipolar disorder Sister   . Cancer Paternal Aunt        NOS, ? colon  . Congestive Heart Failure Maternal Grandmother   . Drug abuse Neg Hx   . CAD Neg Hx   . Stomach cancer Neg Hx     Past Surgical History:  Procedure Laterality Date  . CATARACT EXTRACTION W/ INTRAOCULAR LENS  IMPLANT, BILATERAL    . COLONOSCOPY    . ELECTROCARDIOGRAM  04/16/2006  . EXAMINATION UNDER ANESTHESIA N/A 06/29/2014   Procedure: EXAM UNDER ANESTHESIA;  Surgeon: Janyth Contes, MD;  Location: [facility];  Service: Gynecology;  Laterality: N/A;  . Exercise myoview  01/24/2005  . FLEXIBLE SIGMOIDOSCOPY    . GASTRIC BYPASS  11/09/2013  .  GASTRIC BYPASS    . JOINT REPLACEMENT    . KNEE ARTHROSCOPY  2003   right  . LAPAROSCOPY N/A 03/12/2016   Procedure: LAPAROSCOPIC ANASTOMOSIS OF INTESTINE (ENTEROENTEROSTOMY);  Surgeon: Ladora Daniel, MD;  Location: [facility];  Service: General;  Laterality: N/A;  . LEG SURGERY Left    metal and pins in lower left leg  . mrsa Right    arm  . MULTIPLE TOOTH EXTRACTIONS    . SHOULDER ARTHROSCOPY W/ ROTATOR CUFF REPAIR Right   . stab phlebectomy  Right 02/10/2019   stab phlebectomy > 20 incisions right leg by [name] MD   . TONSILLECTOMY    . TOTAL KNEE ARTHROPLASTY Left 06/30/2018   Procedure: LEFT TOTAL KNEE ARTHROPLASTY;  Surgeon:  Meredith Pel, MD;  Location: [facility];  Service: Orthopedics;  Laterality: Left;  . TOTAL KNEE ARTHROPLASTY Right 11/16/2019  . TOTAL KNEE ARTHROPLASTY Right 11/16/2019   Procedure: RIGHT TOTAL KNEE ARTHROPLASTY-CEMENTED;  Surgeon: Meredith Pel, MD;  Location: [facility];  Service: Orthopedics;  Laterality: Right;  Marland Kitchen VARICOSE VEIN SURGERY     Remotef   Social History   Occupational History  . Occupation: Unemployed    Comment: disabled  Tobacco Use  . Smoking status: Former Smoker    Packs/day: 2.00    Years: 24.00    Pack years: 48.00    Types: Cigarettes    Start date: 06/24/1970    Quit date: 08/06/1994    Years since quitting: 25.5  . Smokeless tobacco: Never Used  Vaping Use  . Vaping Use: Never used  Substance and Sexual Activity  . Alcohol use: No    Alcohol/week: 0.0 standard drinks  . Drug use: No  . Sexual activity: Not Currently

## 2020-02-04 ENCOUNTER — Other Ambulatory Visit: Payer: Self-pay

## 2020-02-04 ENCOUNTER — Other Ambulatory Visit: Payer: Medicare HMO | Admitting: *Deleted

## 2020-02-04 ENCOUNTER — Other Ambulatory Visit (HOSPITAL_COMMUNITY)
Admission: RE | Admit: 2020-02-04 | Discharge: 2020-02-04 | Disposition: A | Discharge status: Home / Self Care | Payer: Medicare HMO | Source: Ambulatory Visit | Attending: Internal Medicine | Admitting: Internal Medicine

## 2020-02-04 DIAGNOSIS — R001 Bradycardia, unspecified: Secondary | ICD-10-CM

## 2020-02-04 DIAGNOSIS — Z01812 Encounter for preprocedural laboratory examination: Secondary | ICD-10-CM | POA: Diagnosis not present

## 2020-02-04 DIAGNOSIS — Z20822 Contact with and (suspected) exposure to covid-19: Secondary | ICD-10-CM | POA: Diagnosis not present

## 2020-02-04 LAB — BASIC METABOLIC PANEL
BUN/Creatinine Ratio: 11 — ABNORMAL LOW (ref 12–28)
BUN: 11 mg/dL (ref 8–27)
CO2: 26 mmol/L (ref 20–29)
Calcium: 11 mg/dL — ABNORMAL HIGH (ref 8.7–10.3)
Chloride: 102 mmol/L (ref 96–106)
Creatinine, Ser: 0.97 mg/dL (ref 0.57–1.00)
GFR calc Af Amer: 71 mL/min/{1.73_m2} (ref >59)
GFR calc non Af Amer: 61 mL/min/{1.73_m2} (ref >59)
Glucose: 137 mg/dL — ABNORMAL HIGH (ref 65–99)
Potassium: 4.2 mmol/L (ref 3.5–5.2)
Sodium: 141 mmol/L (ref 134–144)

## 2020-02-04 LAB — SPECIMEN STATUS REPORT

## 2020-02-04 LAB — CBC WITH DIFFERENTIAL/PLATELET
Basophils Absolute: 0 10*3/uL (ref 0.0–0.2)
Basos: 1 %
EOS (ABSOLUTE): 0.2 10*3/uL (ref 0.0–0.4)
Eos: 4 %
Hematocrit: 41.9 % (ref 34.0–46.6)
Hemoglobin: 12.8 g/dL (ref 11.1–15.9)
Immature Grans (Abs): 0 10*3/uL (ref 0.0–0.1)
Immature Granulocytes: 0 %
Lymphocytes Absolute: 1.5 10*3/uL (ref 0.7–3.1)
Lymphs: 30 %
MCH: 25 pg — ABNORMAL LOW (ref 26.6–33.0)
MCHC: 30.5 g/dL — ABNORMAL LOW (ref 31.5–35.7)
MCV: 82 fL (ref 79–97)
Monocytes Absolute: 0.4 10*3/uL (ref 0.1–0.9)
Monocytes: 7 %
Neutrophils Absolute: 3.1 10*3/uL (ref 1.4–7.0)
Neutrophils: 58 %
Platelets: 239 10*3/uL (ref 150–450)
RBC: 5.12 x10E6/uL (ref 3.77–5.28)
RDW: 14.7 % (ref 11.7–15.4)
WBC: 5.2 10*3/uL (ref 3.4–10.8)

## 2020-02-04 LAB — SARS CORONAVIRUS 2 (TAT 6-24 HRS): SARS Coronavirus 2: NEGATIVE

## 2020-02-05 NOTE — Progress Notes (Signed)
  [place]  [phone] [phone]) [phone]  ID: [name] OB: 08/24/1955  MR#: [id number]  DYN#:[id number]  Patient Care Team: Daisy Floro, DO as PCP - General (Family Medicine) Kennith Center, RD as Dietitian (Family Medicine) Macarthur Critchley, [place] as Referring Physician (Optometry) Duke Salvia Venia Carbon, MD as Attending Physician (Bariatrics) Marlou Sa, [place]onna [place], MD as Consulting Physician (Orthopedic Surgery) Lloyd Huger, MD as Consulting Physician (Oncology)    Lloyd Huger, MD   02/12/2020 9:36 AM     This encounter was created in error - please disregard.

## 2020-02-07 NOTE — Progress Notes (Signed)
Instructed patient on the following items: Arrival time 1230 Nothing to eat or drink after midnight No meds AM of procedure Responsible person to drive you home and stay with you for 24 hrs Wash with special soap night before and morning of procedure If on anti-coagulant drug instructions on Eliquis last dose this am

## 2020-02-08 ENCOUNTER — Encounter (HOSPITAL_COMMUNITY)
Admission: RE | Disposition: A | Discharge status: Home / Self Care | Payer: Medicare HMO | Source: Home / Self Care | Attending: Internal Medicine

## 2020-02-08 ENCOUNTER — Ambulatory Visit (HOSPITAL_COMMUNITY): Payer: Medicare HMO | Admitting: Anesthesiology

## 2020-02-08 ENCOUNTER — Other Ambulatory Visit: Payer: Self-pay

## 2020-02-08 ENCOUNTER — Ambulatory Visit (HOSPITAL_COMMUNITY)
Admission: RE | Admit: 2020-02-08 | Discharge: 2020-02-09 | Disposition: A | Discharge status: Home / Self Care | Payer: Medicare HMO | Attending: Internal Medicine | Admitting: Internal Medicine

## 2020-02-08 DIAGNOSIS — F419 Anxiety disorder, unspecified: Secondary | ICD-10-CM | POA: Insufficient documentation

## 2020-02-08 DIAGNOSIS — E1140 Type 2 diabetes mellitus with diabetic neuropathy, unspecified: Secondary | ICD-10-CM | POA: Diagnosis not present

## 2020-02-08 DIAGNOSIS — I2510 Atherosclerotic heart disease of native coronary artery without angina pectoris: Secondary | ICD-10-CM | POA: Diagnosis not present

## 2020-02-08 DIAGNOSIS — F329 Major depressive disorder, single episode, unspecified: Secondary | ICD-10-CM | POA: Insufficient documentation

## 2020-02-08 DIAGNOSIS — Z950 Presence of cardiac pacemaker: Secondary | ICD-10-CM

## 2020-02-08 DIAGNOSIS — E049 Nontoxic goiter, unspecified: Secondary | ICD-10-CM | POA: Diagnosis not present

## 2020-02-08 DIAGNOSIS — Z006 Encounter for examination for normal comparison and control in clinical research program: Secondary | ICD-10-CM | POA: Insufficient documentation

## 2020-02-08 DIAGNOSIS — E785 Hyperlipidemia, unspecified: Secondary | ICD-10-CM | POA: Diagnosis not present

## 2020-02-08 DIAGNOSIS — Z7901 Long term (current) use of anticoagulants: Secondary | ICD-10-CM | POA: Insufficient documentation

## 2020-02-08 DIAGNOSIS — E7800 Pure hypercholesterolemia, unspecified: Secondary | ICD-10-CM | POA: Diagnosis not present

## 2020-02-08 DIAGNOSIS — I441 Atrioventricular block, second degree: Secondary | ICD-10-CM | POA: Insufficient documentation

## 2020-02-08 DIAGNOSIS — D649 Anemia, unspecified: Secondary | ICD-10-CM | POA: Insufficient documentation

## 2020-02-08 DIAGNOSIS — M1712 Unilateral primary osteoarthritis, left knee: Secondary | ICD-10-CM | POA: Insufficient documentation

## 2020-02-08 DIAGNOSIS — Z79899 Other long term (current) drug therapy: Secondary | ICD-10-CM | POA: Insufficient documentation

## 2020-02-08 DIAGNOSIS — K219 Gastro-esophageal reflux disease without esophagitis: Secondary | ICD-10-CM | POA: Diagnosis not present

## 2020-02-08 DIAGNOSIS — N289 Disorder of kidney and ureter, unspecified: Secondary | ICD-10-CM | POA: Diagnosis not present

## 2020-02-08 DIAGNOSIS — Z791 Long term (current) use of non-steroidal anti-inflammatories (NSAID): Secondary | ICD-10-CM | POA: Diagnosis not present

## 2020-02-08 DIAGNOSIS — I4819 Other persistent atrial fibrillation: Secondary | ICD-10-CM | POA: Insufficient documentation

## 2020-02-08 DIAGNOSIS — R001 Bradycardia, unspecified: Secondary | ICD-10-CM | POA: Diagnosis not present

## 2020-02-08 DIAGNOSIS — E1151 Type 2 diabetes mellitus with diabetic peripheral angiopathy without gangrene: Secondary | ICD-10-CM | POA: Insufficient documentation

## 2020-02-08 DIAGNOSIS — Z7984 Long term (current) use of oral hypoglycemic drugs: Secondary | ICD-10-CM | POA: Diagnosis not present

## 2020-02-08 DIAGNOSIS — I10 Essential (primary) hypertension: Secondary | ICD-10-CM | POA: Diagnosis not present

## 2020-02-08 DIAGNOSIS — G4733 Obstructive sleep apnea (adult) (pediatric): Secondary | ICD-10-CM | POA: Insufficient documentation

## 2020-02-08 DIAGNOSIS — I480 Paroxysmal atrial fibrillation: Secondary | ICD-10-CM | POA: Diagnosis not present

## 2020-02-08 HISTORY — PX: PACEMAKER LEADLESS INSERTION: EP1219

## 2020-02-08 HISTORY — DX: Atrioventricular block, second degree: I44.1

## 2020-02-08 LAB — GLUCOSE, CAPILLARY
Glucose-Capillary: 102 mg/dL — ABNORMAL HIGH (ref 70–99)
Glucose-Capillary: 94 mg/dL (ref 70–99)
Glucose-Capillary: 252 mg/dL — ABNORMAL HIGH (ref 70–99)

## 2020-02-08 SURGERY — PACEMAKER LEADLESS INSERTION
Anesthesia: Monitor Anesthesia Care

## 2020-02-08 MED ORDER — OXYCODONE HCL 5 MG PO TABS: 5.0000 mg | ORAL_TABLET | ORAL | Status: DC | PRN

## 2020-02-08 MED ORDER — SODIUM CHLORIDE 0.9 % IV SOLN: INTRAVENOUS | Status: DC

## 2020-02-08 MED ORDER — SODIUM CHLORIDE 0.9 % IV SOLN: 250.0000 mL | INTRAVENOUS | Status: DC | PRN

## 2020-02-08 MED ORDER — GABAPENTIN 100 MG PO CAPS
100.0000 mg | ORAL_CAPSULE | ORAL | Status: DC
  Administered 2020-02-09: 100 mg via ORAL
  Filled 2020-02-08: qty 1

## 2020-02-08 MED ORDER — HEPARIN (PORCINE) IN NACL 1000-0.9 UT/500ML-% IV SOLN
INTRAVENOUS | Status: AC
  Filled 2020-02-08: qty 500

## 2020-02-08 MED ORDER — HYDRALAZINE HCL 20 MG/ML IJ SOLN
10.0000 mg | Freq: Once | INTRAMUSCULAR | Status: AC
  Administered 2020-02-08: 10 mg via INTRAVENOUS
  Filled 2020-02-08: qty 1

## 2020-02-08 MED ORDER — VANCOMYCIN HCL IN DEXTROSE 1-5 GM/200ML-% IV SOLN
INTRAVENOUS | Status: AC
  Filled 2020-02-08: qty 200

## 2020-02-08 MED ORDER — GABAPENTIN 300 MG PO CAPS
300.0000 mg | ORAL_CAPSULE | Freq: Every day | ORAL | Status: DC
  Administered 2020-02-08: 300 mg via ORAL
  Filled 2020-02-08: qty 1

## 2020-02-08 MED ORDER — SERTRALINE HCL 100 MG PO TABS
100.0000 mg | ORAL_TABLET | Freq: Every day | ORAL | Status: DC
  Administered 2020-02-08 – 2020-02-09 (×2): 100 mg via ORAL
  Filled 2020-02-08 (×2): qty 1

## 2020-02-08 MED ORDER — LISINOPRIL-HYDROCHLOROTHIAZIDE 20-25 MG PO TABS: 1.0000 | ORAL_TABLET | Freq: Every day | ORAL | Status: DC

## 2020-02-08 MED ORDER — ACETAMINOPHEN 325 MG PO TABS: 650.0000 mg | ORAL_TABLET | ORAL | Status: DC | PRN

## 2020-02-08 MED ORDER — BUPIVACAINE HCL (PF) 0.25 % IJ SOLN
INTRAMUSCULAR | Status: AC
  Filled 2020-02-08: qty 30

## 2020-02-08 MED ORDER — TEDUGLUTIDE (RDNA) 5 MG ~~LOC~~ KIT: 5.0000 mg | PACK | Freq: Every day | SUBCUTANEOUS | Status: DC

## 2020-02-08 MED ORDER — PROPOFOL 10 MG/ML IV BOLUS
INTRAVENOUS | Status: DC | PRN
  Administered 2020-02-08: 15 mg via INTRAVENOUS
  Administered 2020-02-08: 20 mg via INTRAVENOUS

## 2020-02-08 MED ORDER — ONDANSETRON HCL 4 MG/2ML IJ SOLN
4.0000 mg | Freq: Four times a day (QID) | INTRAMUSCULAR | Status: DC | PRN
  Administered 2020-02-08: 4 mg via INTRAVENOUS
  Filled 2020-02-08: qty 2

## 2020-02-08 MED ORDER — MIDAZOLAM HCL 2 MG/2ML IJ SOLN
INTRAMUSCULAR | Status: DC | PRN
  Administered 2020-02-08: 2 mg via INTRAVENOUS

## 2020-02-08 MED ORDER — INSULIN ASPART 100 UNIT/ML ~~LOC~~ SOLN: 0.0000 [IU] | Freq: Three times a day (TID) | SUBCUTANEOUS | Status: DC

## 2020-02-08 MED ORDER — AMLODIPINE BESYLATE 10 MG PO TABS
10.0000 mg | ORAL_TABLET | Freq: Every day | ORAL | Status: DC
  Administered 2020-02-09: 10 mg via ORAL
  Filled 2020-02-08: qty 1

## 2020-02-08 MED ORDER — SODIUM CHLORIDE 0.9% FLUSH: 3.0000 mL | INTRAVENOUS | Status: DC | PRN

## 2020-02-08 MED ORDER — HYDROCHLOROTHIAZIDE 25 MG PO TABS
25.0000 mg | ORAL_TABLET | Freq: Every day | ORAL | Status: DC
  Administered 2020-02-08 – 2020-02-09 (×2): 25 mg via ORAL
  Filled 2020-02-08 (×2): qty 1

## 2020-02-08 MED ORDER — BUPIVACAINE HCL (PF) 0.25 % IJ SOLN
INTRAMUSCULAR | Status: DC | PRN
  Administered 2020-02-08: 30 mL

## 2020-02-08 MED ORDER — VANCOMYCIN HCL 1500 MG/300ML IV SOLN
1500.0000 mg | INTRAVENOUS | Status: DC
  Administered 2020-02-08: 1500 mg via INTRAVENOUS
  Filled 2020-02-08: qty 300

## 2020-02-08 MED ORDER — LISINOPRIL 20 MG PO TABS
20.0000 mg | ORAL_TABLET | Freq: Every day | ORAL | Status: DC
  Administered 2020-02-08 – 2020-02-09 (×2): 20 mg via ORAL
  Filled 2020-02-08 (×2): qty 1

## 2020-02-08 MED ORDER — FENTANYL CITRATE (PF) 100 MCG/2ML IJ SOLN
INTRAMUSCULAR | Status: DC | PRN
  Administered 2020-02-08: 50 ug via INTRAVENOUS

## 2020-02-08 MED ORDER — HEPARIN (PORCINE) IN NACL 1000-0.9 UT/500ML-% IV SOLN
INTRAVENOUS | Status: DC | PRN
  Administered 2020-02-08 (×4): 500 mL

## 2020-02-08 MED ORDER — SODIUM CHLORIDE 0.9% FLUSH
3.0000 mL | Freq: Two times a day (BID) | INTRAVENOUS | Status: DC
  Administered 2020-02-08 – 2020-02-09 (×2): 3 mL via INTRAVENOUS

## 2020-02-08 MED ORDER — METHOCARBAMOL 500 MG PO TABS: 500.0000 mg | ORAL_TABLET | Freq: Three times a day (TID) | ORAL | Status: DC | PRN

## 2020-02-08 MED ORDER — SPIRONOLACTONE 25 MG PO TABS
25.0000 mg | ORAL_TABLET | Freq: Every day | ORAL | Status: DC
  Administered 2020-02-08 – 2020-02-09 (×2): 25 mg via ORAL
  Filled 2020-02-08 (×2): qty 1

## 2020-02-08 MED ORDER — METFORMIN HCL 500 MG PO TABS
1000.0000 mg | ORAL_TABLET | Freq: Every day | ORAL | Status: DC
  Administered 2020-02-09: 1000 mg via ORAL
  Filled 2020-02-08: qty 2

## 2020-02-08 MED ORDER — APIXABAN 5 MG PO TABS: 5.0000 mg | ORAL_TABLET | Freq: Two times a day (BID) | ORAL | Status: DC

## 2020-02-08 MED ORDER — ACETAMINOPHEN 500 MG PO TABS: 1000.0000 mg | ORAL_TABLET | Freq: Once | ORAL | Status: DC

## 2020-02-08 MED ORDER — PROPOFOL 500 MG/50ML IV EMUL
INTRAVENOUS | Status: DC | PRN
  Administered 2020-02-08: 50 ug/kg/min via INTRAVENOUS

## 2020-02-08 SURGICAL SUPPLY — 15 items
BAG SNAP BAND KOVER 36X36 (MISCELLANEOUS) ×2 IMPLANT
BLANKET WARM UNDERBOD FULL ACC (MISCELLANEOUS) ×2 IMPLANT
CABLE SURGICAL S-101-97-12 (CABLE) ×3 IMPLANT
DEVICE CLOSURE PERCLS PRGLD 6F (VASCULAR PRODUCTS) IMPLANT
MICRA INTRODUCER SHEATH (SHEATH) ×3
MICRA TRANSCATH PACING SYSTEM (Pacemaker) ×3 IMPLANT
PAD PRO RADIOLUCENT 2001M-C (PAD) ×3 IMPLANT
PERCLOSE PROGLIDE 6F (VASCULAR PRODUCTS) ×6
SHEATH DILAT COONS TAPER 22F (SHEATH) ×2 IMPLANT
SHEATH INTRODUCER MICRA (SHEATH) IMPLANT
SHEATH PINNACLE 8F 10CM (SHEATH) ×2 IMPLANT
SHEATH PROBE COVER 6X72 (BAG) ×2 IMPLANT
SYSTEM PACING TRNSCTH MICRA (Pacemaker) IMPLANT
TRAY PACEMAKER INSERTION (PACKS) ×3 IMPLANT
WIRE AMPLATZ SS-J .035X180CM (WIRE) ×2 IMPLANT

## 2020-02-08 NOTE — Anesthesia Postprocedure Evaluation (Signed)
Anesthesia Post Note  Patient: [name]  Procedure(s) Performed: PACEMAKER LEADLESS INSERTION (N/A )     Patient location during evaluation: Cath Lab Anesthesia Type: MAC Level of consciousness: awake and alert Pain management: pain level controlled Vital Signs Assessment: post-procedure vital signs reviewed and stable Respiratory status: spontaneous breathing, nonlabored ventilation, respiratory function stable and patient connected to nasal cannula oxygen Cardiovascular status: stable and blood pressure returned to baseline Postop Assessment: no apparent nausea or vomiting Anesthetic complications: no   No complications documented.  Last Vitals:  Vitals:   02/08/20 1725 02/08/20 1727  BP: (!) 175/62   Pulse: (!) 49   Resp: 14   Temp:  36.8 C  SpO2: 95%     Last Pain:  Vitals:   02/08/20 1727  TempSrc: Temporal  PainSc:                  Catalina Gravel

## 2020-02-08 NOTE — Progress Notes (Addendum)
BP 194/66, HR 63 with frequent PVCs. Glenford Peers, NP paged.  Will continue to monitor.   Call received from [facility], NP stating she will assess and place new orders.

## 2020-02-08 NOTE — H&P (Signed)
PCP: Daisy Floro, DO Primary Cardiologist: previously Dr Genene Churn [facility]) Primary EP: Dr Rayann Heman  [name] is a 64 y.o. female who presents today for leadless pacemaker implantation.  I recently saw her post operatively after knee surgery with bradycardia.  Since her knee surgery, the patient reports doing reasonably well.  She is making good progress.  She is persistently in afib.  She has had recurrent symptoms of dizziness and fatigue with presyncope similar to her prior bradycardia events.  + stable edema. Today, she denies symptoms of palpitations, chest pain, shortness of breath,  or syncope.  The patient is otherwise without complaint today.       Past Medical History:  Diagnosis Date  . Allergy   . Anemia   . ANEMIA, PERNICIOUS, HX OF 05/13/2007  . Anxiety   . Arthritis   . Arthritis of knee 06/30/2018  . ASYMPTOMATIC POSTMENOPAUSAL STATUS 02/18/2008  . BACK PAIN, LUMBAR 11/19/2007  . Bowel incontinence   . Cataract   . Cellulitis of left lower leg 10/28/2013  . Cellulitis of leg, right 10/11/2010  . Chronic dermatitis of hands 05/02/2009  . DEPRESSION, CHRONIC 10/06/2007  . Diabetes mellitus without complication ([place])   . DIABETES MELLITUS, WITH NEUROLOGICAL COMPLICATIONS 6/65/9935  . Diabetic neuropathy ([place])   . DYSLIPIDEMIA 05/13/2007  . Essential hypertension 03/20/2010  . Family history of colon cancer   . Fecal soiling 06/28/2014  . Frozen shoulder    Lt  . Full dentures   . GERD (gastroesophageal reflux disease)   . GI bleed 03/15/2016  . GOITER, MULTINODULAR 05/13/2007  . Headache   . History of blood transfusion   . Hx of colonic polyps 12/04/2010  . HYPERCHOLESTEROLEMIA 03/20/2010  . HYPERTENSION 03/20/2010  . Lower back pain   . NASH (nonalcoholic steatohepatitis)   . OA (osteoarthritis)   . OBSTRUCTIVE SLEEP APNEA 06/12/2007   uses CPAP.  Marland Kitchen PERIPHERAL NEUROPATHY 01/12/2009  . Peripheral vascular disease ([place])   . Pneumonia    . Primary osteoarthritis of left knee 04/23/2011   Previously seen by Dr. Alphonzo Severance. Will plan on repeat referral after trial injection.     Marland Kitchen PSORIASIS 05/28/2010  . Rectal prolapse 05/24/2015  . Rectovaginal fistula 05/25/2014  . Renal insufficiency    stage 1 kd  . Sleep apnea   . Unilateral primary osteoarthritis, left knee   . UNSPECIFIED VENOUS INSUFFICIENCY 05/28/2010  . UNSPECIFIED VENOUS INSUFFICIENCY 05/28/2010   Patient with frequent ulceration related to venous stasis-seen at wound care. Edema and Venous stasis changes due to this . Echo 04/16/11 with EF 60%. No signs diastolic dysfunction.       . Wears glasses         Past Surgical History:  Procedure Laterality Date  . CATARACT EXTRACTION W/ INTRAOCULAR LENS  IMPLANT, BILATERAL    . COLONOSCOPY    . ELECTROCARDIOGRAM  04/16/2006  . EXAMINATION UNDER ANESTHESIA N/A 06/29/2014   Procedure: EXAM UNDER ANESTHESIA;  Surgeon: Janyth Contes, MD;  Location: [facility];  Service: Gynecology;  Laterality: N/A;  . Exercise myoview  01/24/2005  . FLEXIBLE SIGMOIDOSCOPY    . GASTRIC BYPASS  11/09/2013  . GASTRIC BYPASS    . JOINT REPLACEMENT    . KNEE ARTHROSCOPY  2003   right  . LAPAROSCOPY N/A 03/12/2016   Procedure: LAPAROSCOPIC ANASTOMOSIS OF INTESTINE (ENTEROENTEROSTOMY);  Surgeon: Ladora Daniel, MD;  Location: [facility];  Service: General;  Laterality: N/A;  . LEG SURGERY Left  metal and pins in lower left leg  . mrsa Right    arm  . MULTIPLE TOOTH EXTRACTIONS    . SHOULDER ARTHROSCOPY W/ ROTATOR CUFF REPAIR Right   . stab phlebectomy  Right 02/10/2019   stab phlebectomy > 20 incisions right leg by [name] MD   . TONSILLECTOMY    . TOTAL KNEE ARTHROPLASTY Left 06/30/2018   Procedure: LEFT TOTAL KNEE ARTHROPLASTY;  Surgeon: Meredith Pel, MD;  Location: [place];  Service: Orthopedics;  Laterality: Left;  . TOTAL KNEE ARTHROPLASTY Right 11/16/2019  . TOTAL KNEE  ARTHROPLASTY Right 11/16/2019   Procedure: RIGHT TOTAL KNEE ARTHROPLASTY-CEMENTED;  Surgeon: Meredith Pel, MD;  Location: [place];  Service: Orthopedics;  Laterality: Right;  Marland Kitchen VARICOSE VEIN SURGERY     Remotef    ROS- all systems are reviewed and negatives except as per HPI above        Current Outpatient Medications  Medication Sig Dispense Refill  . amLODipine (NORVASC) 10 MG tablet Take 1 tablet (10 mg total) by mouth daily. 90 tablet 0  . apixaban (ELIQUIS) 5 MG TABS tablet Take 1 tablet (5 mg total) by mouth 2 (two) times daily. 60 tablet 0  . atorvastatin (LIPITOR) 40 MG tablet Take 1 tablet (40 mg total) by mouth daily. 90 tablet 0  . celecoxib (CELEBREX) 200 MG capsule Take 1 capsule (200 mg total) by mouth 2 (two) times daily. 60 capsule 0  . ferrous sulfate 324 (65 Fe) MG TBEC Take 1 tablet (325 mg total) by mouth every other day. 30 tablet 6  . gabapentin (NEURONTIN) 100 MG capsule Take 100 mg by mouth every morning.    . gabapentin (NEURONTIN) 300 MG capsule Take 300 mg by mouth at bedtime.    Marland Kitchen glucose blood (ACCU-CHEK AVIVA PLUS) test strip Use to check sugar once daily in the morning 100 each 0  . lisinopril-hydrochlorothiazide (ZESTORETIC) 20-25 MG tablet Take 1 tablet by mouth daily. 90 tablet 3  . metFORMIN (GLUCOPHAGE) 1000 MG tablet Take 1 tablet (1,000 mg total) by mouth daily with breakfast. 90 tablet 2  . methocarbamol (ROBAXIN) 500 MG tablet Take 1 tablet (500 mg total) by mouth every 8 (eight) hours as needed for muscle spasms. 30 tablet 0  . methocarbamol (ROBAXIN) 500 MG tablet TAKE 1 TABLET BY MOUTH EVERY 6 HOURS AS NEEDED FOR MUSCLE SPASM 120 tablet 3  . multivitamin-iron-minerals-folic acid (CENTRUM) chewable tablet Chew 1 tablet by mouth daily.     . NON FORMULARY Diet type:  NAS    . oxyCODONE (OXY IR/ROXICODONE) 5 MG immediate release tablet Take 1 tablet (5 mg total) by mouth every 4 (four) hours as needed for severe pain. 35 tablet 0  .  sertraline (ZOLOFT) 100 MG tablet Take 1 tablet (100 mg total) by mouth daily. 90 tablet 1  . Teduglutide, rDNA, (GATTEX) 5 MG KIT Inject 5 mg into the skin daily.     No current facility-administered medications for this visit.     Physical Exam: Vitals:   02/08/20 1325  BP: (!) 165/77  Pulse: 68  Temp: 98 F (36.7 C)  TempSrc: Oral  SpO2: 98%  Weight: 103 kg  Height: 5' 8"  (1.727 m)    GEN- The patient is overweight, chronically ill appearing, alert and oriented x 3 today.   Head- normocephalic, atraumatic Eyes-  Sclera clear, conjunctiva pink Ears- hearing intact Oropharynx- clear Lungs-  normal work of breathing Heart- irregular rate and rhythm  GI-  soft  Extremities- no clubbing, cyanosis, + edema with venous stasis changes     Wt Readings from Last 3 Encounters:  12/29/19 233 lb (105.7 kg)  11/16/19 233 lb (105.7 kg)  11/12/19 233 lb 14.4 oz (106.1 kg)    EKG tracing ordered today is personally reviewed and shows afib, LBBB (new),  V rates 80s  Assessment and Plan:  1. Bradycardia due to second degree AV block She has ongoing symptomatic bradycardia. I would therefore recommend pacemaker implantation at this time. We discussed leadless and transvenous pacing today.  I think she would be a good candidate for Micra leadless pacing trial.  Risks, benefits, alternatives to pacemaker implantation (both leadless and traditional) were discussed in detail with the patient today.  The patient understands that the risks include but are not limited to bleeding, infection, pneumothorax, perforation, tamponade, vascular damage, renal failure, MI, stroke, death,  and lead dislodgement and wishes to proceed.  Thompson Grayer MD, [place] 02/08/2020 3:15 PM

## 2020-02-08 NOTE — Transfer of Care (Signed)
Immediate Anesthesia Transfer of Care Note  Patient: [name]  Procedure(s) Performed: PACEMAKER LEADLESS INSERTION (N/A )  Patient Location: PACU  Anesthesia Type:MAC  Level of Consciousness: awake, alert  and patient cooperative  Airway & Oxygen Therapy: Patient Spontanous Breathing  Post-op Assessment: Report given to RN and Post -op Vital signs reviewed and stable  Post vital signs: Reviewed and stable  Last Vitals:  Vitals Value Taken Time  BP 185/71 02/08/20 1653  Temp    Pulse 55 02/08/20 1654  Resp 17 02/08/20 1654  SpO2 100 % 02/08/20 1654  Vitals shown include unvalidated device data.  Last Pain:  Vitals:   02/08/20 1411  TempSrc:   PainSc: 0-No pain         Complications: No complications documented.

## 2020-02-08 NOTE — Anesthesia Preprocedure Evaluation (Addendum)
Anesthesia Evaluation  Patient identified by MRN, date of birth, ID band Patient awake    Reviewed: Allergy & Precautions, NPO status , Patient's Chart, lab work & pertinent test results  Airway Mallampati: II  TM Distance: >3 FB Neck ROM: Full    Dental  (+) Upper Dentures, Lower Dentures   Pulmonary sleep apnea , former smoker,    Pulmonary exam normal breath sounds clear to auscultation       Cardiovascular hypertension, Pt. on medications + Peripheral Vascular Disease  Normal cardiovascular exam+ dysrhythmias Atrial Fibrillation  Rhythm:Regular Rate:Normal  ECG: a-fib, rate 89  ECHO: 1. Left ventricular ejection fraction, by estimation, is 55 to 60%. The left ventricle has normal function. The left ventricle has no regional wall motion abnormalities. Left ventricular diastolic function could not be evaluated. 2. Right ventricular systolic function is normal. The right ventricular size is normal. There is normal pulmonary artery systolic pressure. 3. The mitral valve is abnormal. Trivial mitral valve regurgitation. 4. The aortic valve is tricuspid. Aortic valve regurgitation is not visualized.   Neuro/Psych  Headaches, PSYCHIATRIC DISORDERS Anxiety Depression Diabetic neuropathy   Neuromuscular disease    GI/Hepatic negative GI ROS, NASH (nonalcoholic steatohepatitis)   Endo/Other  diabetes, Oral Hypoglycemic Agents  Renal/GU Renal disease     Musculoskeletal  (+) Arthritis , BACK PAIN, LUMBAR   Abdominal (+) + obese,   Peds  Hematology HLD   Anesthesia Other Findings bradycardia  Reproductive/Obstetrics                            Anesthesia Physical Anesthesia Plan  ASA: III  Anesthesia Plan: MAC   Post-op Pain Management:    Induction: Intravenous  PONV Risk Score and Plan: 2 and Ondansetron, Dexamethasone, Propofol infusion and Treatment may vary due to age or medical  condition  Airway Management Planned: Simple Face Mask  Additional Equipment:   Intra-op Plan:   Post-operative Plan:   Informed Consent: I have reviewed the patients History and Physical, chart, labs and discussed the procedure including the risks, benefits and alternatives for the proposed anesthesia with the patient or authorized representative who has indicated his/her understanding and acceptance.       Plan Discussed with: CRNA and Surgeon  Anesthesia Plan Comments:        Anesthesia Quick Evaluation

## 2020-02-08 NOTE — Anesthesia Procedure Notes (Signed)
Procedure Name: MAC Date/Time: 02/08/2020 3:46 PM Performed by: Janace Litten, CRNA Pre-anesthesia Checklist: Patient identified, Emergency Drugs available, Suction available and Patient being monitored Patient Re-evaluated:Patient Re-evaluated prior to induction Oxygen Delivery Method: Simple face mask

## 2020-02-09 ENCOUNTER — Other Ambulatory Visit: Payer: Self-pay

## 2020-02-09 ENCOUNTER — Encounter (HOSPITAL_COMMUNITY): Payer: Self-pay | Admitting: Internal Medicine

## 2020-02-09 ENCOUNTER — Ambulatory Visit (HOSPITAL_COMMUNITY): Payer: Medicare HMO

## 2020-02-09 DIAGNOSIS — I441 Atrioventricular block, second degree: Secondary | ICD-10-CM

## 2020-02-09 DIAGNOSIS — Z950 Presence of cardiac pacemaker: Secondary | ICD-10-CM

## 2020-02-09 DIAGNOSIS — E785 Hyperlipidemia, unspecified: Secondary | ICD-10-CM | POA: Diagnosis not present

## 2020-02-09 DIAGNOSIS — F419 Anxiety disorder, unspecified: Secondary | ICD-10-CM | POA: Diagnosis not present

## 2020-02-09 DIAGNOSIS — R001 Bradycardia, unspecified: Secondary | ICD-10-CM | POA: Diagnosis not present

## 2020-02-09 DIAGNOSIS — I4819 Other persistent atrial fibrillation: Secondary | ICD-10-CM | POA: Diagnosis not present

## 2020-02-09 DIAGNOSIS — D509 Iron deficiency anemia, unspecified: Secondary | ICD-10-CM

## 2020-02-09 DIAGNOSIS — Z006 Encounter for examination for normal comparison and control in clinical research program: Secondary | ICD-10-CM | POA: Diagnosis not present

## 2020-02-09 DIAGNOSIS — E1140 Type 2 diabetes mellitus with diabetic neuropathy, unspecified: Secondary | ICD-10-CM | POA: Diagnosis not present

## 2020-02-09 DIAGNOSIS — D649 Anemia, unspecified: Secondary | ICD-10-CM | POA: Diagnosis not present

## 2020-02-09 DIAGNOSIS — E1151 Type 2 diabetes mellitus with diabetic peripheral angiopathy without gangrene: Secondary | ICD-10-CM | POA: Diagnosis not present

## 2020-02-09 DIAGNOSIS — F329 Major depressive disorder, single episode, unspecified: Secondary | ICD-10-CM | POA: Diagnosis not present

## 2020-02-09 DIAGNOSIS — I499 Cardiac arrhythmia, unspecified: Secondary | ICD-10-CM | POA: Diagnosis not present

## 2020-02-09 LAB — BASIC METABOLIC PANEL
Anion gap: 9 (ref 5–15)
BUN: 14 mg/dL (ref 8–23)
CO2: 24 mmol/L (ref 22–32)
Calcium: 10.9 mg/dL — ABNORMAL HIGH (ref 8.9–10.3)
Chloride: 107 mmol/L (ref 98–111)
Creatinine, Ser: 1 mg/dL (ref 0.44–1.00)
GFR calc Af Amer: >60 mL/min (ref >60)
GFR calc non Af Amer: 59 mL/min — ABNORMAL LOW (ref >60)
Glucose, Bld: 143 mg/dL — ABNORMAL HIGH (ref 70–99)
Potassium: 4.3 mmol/L (ref 3.5–5.1)
Sodium: 140 mmol/L (ref 135–145)

## 2020-02-09 LAB — GLUCOSE, CAPILLARY: Glucose-Capillary: 115 mg/dL — ABNORMAL HIGH (ref 70–99)

## 2020-02-09 MED ORDER — APIXABAN 5 MG PO TABS: 5.0000 mg | ORAL_TABLET | Freq: Two times a day (BID) | ORAL | 0 refills | Status: DC

## 2020-02-09 MED ORDER — ACETAMINOPHEN 325 MG PO TABS: 650.0000 mg | ORAL_TABLET | ORAL | Status: DC | PRN

## 2020-02-09 MED ORDER — APIXABAN 5 MG PO TABS
5.0000 mg | ORAL_TABLET | Freq: Two times a day (BID) | ORAL | Status: DC
  Filled 2020-02-09: qty 1

## 2020-02-09 NOTE — Discharge Instructions (Signed)
After Your Leadless Pacemaker  . You have a Medtronic Pacemaker  . Do not squat, lift over 5 lbs, or have sexual activity for 1 week.   . Do not drive for 4 days, or until instructed by your healthcare provider that you are safe to do so.   . Monitor your surgical site for redness, swelling, and drainage. Call the device clinic at [phone] if you experience these symptoms or fever/chills.  . You may shower 1 day after your pacemaker implant and wash around the site with soap and water. Avoid lotions, ointments, or perfumes over your incision until it is well-healed.  . You may use a hot tub or a pool AFTER your wound check appointment if the incision is completely closed.  . Your Pacemaker may be MRI compatible. We will discuss this at your first follow up/wound check. .   . Remote monitoring is used to monitor your pacemaker from home. This monitoring is scheduled every 91 days by our office. It allows [place] to keep an eye on the functioning of your device to ensure it is working properly. You will routinely see your Electrophysiologist annually (more often if necessary).    Pacemaker Implantation, Care After This sheet gives you information about how to care for yourself after your procedure. Your health care provider may also give you more specific instructions. If you have problems or questions, contact your health care provider. What can I expect after the procedure? After the procedure, it is common to have:  Mild pain.  Slight bruising.  Some swelling over the incision.  You should received your Pacemaker ID card within 4-8 weeks. Follow these instructions at home: Medicines  Take over-the-counter and prescription medicines only as told by your health care provider.  If you were prescribed an antibiotic medicine, take it as told by your health care provider. Do not stop taking the antibiotic even if you start to feel better. Wound care   Check the incision site every  day to make sure it is not infected, bleeding, or starting to pull apart.  Do not use lotions or ointments near the incision site unless directed to do so.  Keep the incision area clean and dry for 7 days after the procedure or as directed by your health care provider. It takes several weeks for the incision site to completely heal.  Do not take baths, swim, or use a hot tub until seen in the device clinic.  Activity  Do not drive or use heavy machinery while taking prescription pain medicine.  Do not drive for 24 hours if you were given a medicine to help you relax (sedative).  Check with your health care provider before you start to drive or play sports.  You may go back to work when your health care provider says it is okay. Pacemaker care  You may be shown how to transfer data from your pacemaker through the phone to your health care provider.  Always let all health care providers know about your pacemaker before you have any medical procedures or tests.  Wear a medical ID bracelet or necklace stating that you have a pacemaker. Carry a pacemaker ID card with you at all times.  Your pacemaker battery will last for 5-15 years. Routine checks by your health care provider will let the health care provider know when the battery is starting to run down. The pacemaker will need to be replaced when the battery starts to run down.  Do not use  amateur Chief of Staff. Other electrical devices are safe to use, including power tools, lawn mowers, and speakers. If you are unsure of whether something is safe to use, ask your health care provider.  When using your cell phone, hold it to the ear opposite the pacemaker. Do not leave your cell phone in a pocket over the pacemaker.  Avoid places or objects that have a strong electric or magnetic field, including: ? Airport Herbalist. When at the airport, let officials know that you have a pacemaker. ? Power  plants. ? Large electrical generators. ? Radiofrequency transmission towers, such as cell phone and radio towers. General instructions  Weigh yourself every day. If you suddenly gain weight, fluid may be building up in your body.  Keep all follow-up visits as told by your health care provider. This is important. Contact a health care provider if:  You gain weight suddenly.  Your legs or feet swell.  It feels like your heart is fluttering or skipping beats (heart palpitations).  You have chills or a fever.  You have more redness, swelling, or pain around your incisions.  You have more fluid or blood coming from your incisions.  Your incisions feel warm to the touch.  You have pus or a bad smell coming from your incisions. Get help right away if:  You have chest pain.  You have trouble breathing or are short of breath.  You become extremely tired.  You are light-headed or you faint. This information is not intended to replace advice given to you by your health care provider. Make sure you discuss any questions you have with your health care provider.

## 2020-02-09 NOTE — Discharge Summary (Signed)
ELECTROPHYSIOLOGY PROCEDURE DISCHARGE SUMMARY    Patient ID: [name],  MRN: [id number], DOB/AGE: March 09, 1956 64 y.o.  Admit date: 02/08/2020 Discharge date: 02/09/2020  Primary Care Physician: Daisy Floro, DO  Primary Cardiologist: Previously Dr. Genene Churn Electrophysiologist: Dr. Quentin Ore (initially seen by Dr. Rayann Heman)  Primary Discharge Diagnosis:  Symptomatic bradycardia status post pacemaker implantation this admission  Secondary Discharge Diagnosis:  Afib Hypertensive cardiovascular disease  Allergies  Allergen Reactions  . Cephalexin Hives and Rash    Denies Airway involvement  . Latex Rash  . Neomycin-Bacitracin Zn-Polymyx Rash  . Tape Rash    Latex Prefers paper tape     Procedures This Admission:  1.  Implantation of a Medtronic Micra Leadless PPM on 02/08/2020 by Dr. Quentin Ore and Dr. Rayann Heman. The patient received a Medtronic model number [id number] leadless. There were no immediate post procedure complications. 2.  CXR on 02/09/20 demonstrated no pneumothorax status post device implantation.   Brief HPI: [name] is a 64 y.o. female was referred to electrophysiology in the outpatient setting for consideration of PPM implantation.  Past medical history includes above.  The patient has had symptomatic bradycardia without reversible causes identified.  Risks, benefits, and alternatives to PPM implantation were reviewed with the patient who wished to proceed.   Hospital Course:  The patient was admitted and underwent implantation of a Medtronic Micra Leadless PPM with details as outlined above.  She was monitored on telemetry overnight which demonstrated NSR 50-70s with intermittent VP (17.2% by interrogation). Groin was without hematoma or ecchymosis.  The device was interrogated and found to be functioning normally.  CXR was obtained and demonstrated no pneumothorax status post device implantation.  Wound care, arm mobility, and restrictions were reviewed  with the patient.  The patient was examined and considered stable for discharge to home.    Regarding blood thinner therapy, they were instructed to resume their Eliquis (medication name) on 02/09/2020 with the evening dose (date/time).   Physical Exam: Vitals:   02/09/20 0053 02/09/20 0524 02/09/20 0525 02/09/20 0819  BP: 110/60  (!) 125/53 (!) 146/64  Pulse: 64 (!) 56  64  Resp: 12  13   Temp: 98.3 F (36.8 C) 97.9 F (36.6 C)  97.6 F (36.4 C)  TempSrc: Oral Oral  Oral  SpO2: 93%  93%   Weight:      Height:        GEN- The patient is well appearing, alert and oriented x 3 today.   HEENT: normocephalic, atraumatic; sclera clear, conjunctiva pink; hearing intact; oropharynx clear; neck supple, no JVP Lymph- no cervical lymphadenopathy Lungs- Clear to ausculation bilaterally, normal work of breathing.  No wheezes, rales, rhonchi Heart- Regular rate and rhythm, no murmurs, rubs or gallops, PMI not laterally displaced GI- soft, non-tender, non-distended, bowel sounds present, no hepatosplenomegaly Extremities- no clubbing, cyanosis, or edema; DP/PT/radial pulses 2+ bilaterally MS- no significant deformity or atrophy Skin- warm and dry, no rash or lesion, Groin without hematoma/ecchymosis Psych- euthymic mood, full affect Neuro- strength and sensation are intact   Labs:   Lab Results  Component Value Date   WBC 5.2 02/04/2020   HGB 12.8 02/04/2020   HCT 41.9 02/04/2020   MCV 82 02/04/2020   PLT 239 02/04/2020    Recent Labs  Lab 02/04/20 0000  NA 141  K 4.2  CL 102  CO2 26  BUN 11  CREATININE 0.97  CALCIUM 11.0*  GLUCOSE 137*    Discharge Medications:  Allergies as of 02/09/2020      Reactions   Cephalexin Hives, Rash   Denies Airway involvement   Latex Rash   Neomycin-bacitracin Zn-polymyx Rash   Tape Rash   Latex Prefers paper tape      Medication List    TAKE these medications   acetaminophen 325 MG tablet Commonly known as: TYLENOL Take 2  tablets (650 mg total) by mouth every 4 (four) hours as needed for headache or mild pain.   amLODipine 10 MG tablet Commonly known as: NORVASC TAKE 1 TABLET BY MOUTH EVERY DAY   apixaban 5 MG Tabs tablet Commonly known as: Eliquis Take 1 tablet (5 mg total) by mouth 2 (two) times daily. Resume with EVENING dose today. What changed: additional instructions   atorvastatin 40 MG tablet Commonly known as: LIPITOR TAKE 1 TABLET BY MOUTH EVERY DAY   celecoxib 200 MG capsule Commonly known as: CELEBREX Take 1 capsule (200 mg total) by mouth 2 (two) times daily.   ferrous sulfate 324 (65 Fe) MG Tbec TAKE 1 TABLET BY MOUTH EVERY OTHER DAY   gabapentin 300 MG capsule Commonly known as: NEURONTIN Take 300 mg by mouth at bedtime.   gabapentin 100 MG capsule Commonly known as: NEURONTIN Take 100 mg by mouth every morning.   Gattex 5 MG Kit Generic drug: Teduglutide (rDNA) Inject 5 mg into the skin daily.   glucose blood test strip Commonly known as: Accu-Chek Aviva Plus Use to check sugar once daily in the morning   lisinopril-hydrochlorothiazide 20-25 MG tablet Commonly known as: ZESTORETIC Take 1 tablet by mouth daily.   metFORMIN 1000 MG tablet Commonly known as: GLUCOPHAGE Take 1 tablet (1,000 mg total) by mouth daily with breakfast.   methocarbamol 500 MG tablet Commonly known as: ROBAXIN Take 1 tablet (500 mg total) by mouth every 8 (eight) hours as needed for muscle spasms. What changed: Another medication with the same name was changed. Make sure you understand how and when to take each.   methocarbamol 500 MG tablet Commonly known as: ROBAXIN TAKE 1 TABLET BY MOUTH EVERY 6 HOURS AS NEEDED FOR MUSCLE SPASM What changed: See the new instructions.   multivitamin-iron-minerals-folic acid chewable tablet Chew 1 tablet by mouth daily.   NON FORMULARY Diet type:  NAS   oxyCODONE 5 MG immediate release tablet Commonly known as: Oxy IR/ROXICODONE Take 1 tablet (5 mg  total) by mouth every 4 (four) hours as needed for severe pain.   sertraline 100 MG tablet Commonly known as: ZOLOFT Take 1 tablet (100 mg total) by mouth daily.   spironolactone 25 MG tablet Commonly known as: ALDACTONE Take 1 tablet (25 mg total) by mouth daily.       Disposition:    Follow-up Information    Lambert, Cameron T, MD Follow up on 05/15/2020.   Why: at 240 pm for 91 day pacemaker check Contact information: [address] [place] [address] [phone]        [facility] Follow up on 02/22/2020.   Why: at 1000 am for leadless pacemaker wound check Contact information: [address] [place] [address] [phone]              Duration of Discharge Encounter: Greater than 30 minutes including physician time.  Signed,  Andrew , PA-C  02/09/2020 8:30 AM   

## 2020-02-10 ENCOUNTER — Encounter: Payer: Self-pay | Admitting: Oncology

## 2020-02-10 ENCOUNTER — Other Ambulatory Visit: Payer: Self-pay

## 2020-02-10 ENCOUNTER — Inpatient Hospital Stay: Payer: Medicare HMO | Attending: Oncology

## 2020-02-10 DIAGNOSIS — D509 Iron deficiency anemia, unspecified: Secondary | ICD-10-CM | POA: Diagnosis not present

## 2020-02-10 LAB — CBC WITH DIFFERENTIAL/PLATELET
Abs Immature Granulocytes: 0.01 10*3/uL (ref 0.00–0.07)
Basophils Absolute: 0.1 10*3/uL (ref 0.0–0.1)
Basophils Relative: 1 %
Eosinophils Absolute: 0.2 10*3/uL (ref 0.0–0.5)
Eosinophils Relative: 4 %
HCT: 40.3 % (ref 36.0–46.0)
Hemoglobin: 12.6 g/dL (ref 12.0–15.0)
Immature Granulocytes: 0 %
Lymphocytes Relative: 21 %
Lymphs Abs: 1.3 10*3/uL (ref 0.7–4.0)
MCH: 26.1 pg (ref 26.0–34.0)
MCHC: 31.3 g/dL (ref 30.0–36.0)
MCV: 83.6 fL (ref 80.0–100.0)
Monocytes Absolute: 0.4 10*3/uL (ref 0.1–1.0)
Monocytes Relative: 7 %
Neutro Abs: 4.2 10*3/uL (ref 1.7–7.7)
Neutrophils Relative %: 67 %
Platelets: 197 10*3/uL (ref 150–400)
RBC: 4.82 MIL/uL (ref 3.87–5.11)
RDW: 14.4 % (ref 11.5–15.5)
WBC: 6.1 10*3/uL (ref 4.0–10.5)
nRBC: 0 % (ref 0.0–0.2)

## 2020-02-10 LAB — IRON AND TIBC
Iron: 37 ug/dL (ref 28–170)
Saturation Ratios: 11 % (ref 10.4–31.8)
TIBC: 323 ug/dL (ref 250–450)
UIBC: 286 ug/dL

## 2020-02-10 LAB — FERRITIN: Ferritin: 102 ng/mL (ref 11–307)

## 2020-02-11 ENCOUNTER — Inpatient Hospital Stay: Payer: Medicare HMO

## 2020-02-11 ENCOUNTER — Inpatient Hospital Stay: Payer: Medicare HMO | Admitting: Oncology

## 2020-02-16 ENCOUNTER — Telehealth: Payer: Self-pay

## 2020-02-16 NOTE — Telephone Encounter (Signed)
I spoke with the pt to educate her on the monitor. I let her know she only have to send transmissions on the schedule dates.

## 2020-02-18 ENCOUNTER — Telehealth: Payer: Self-pay | Admitting: *Deleted

## 2020-02-18 NOTE — Telephone Encounter (Signed)
90 day Ortho bundle call completed. 

## 2020-02-22 ENCOUNTER — Ambulatory Visit: Payer: Medicare HMO

## 2020-02-24 ENCOUNTER — Ambulatory Visit (INDEPENDENT_AMBULATORY_CARE_PROVIDER_SITE_OTHER): Payer: Medicare HMO | Admitting: Emergency Medicine

## 2020-02-24 ENCOUNTER — Other Ambulatory Visit: Payer: Self-pay

## 2020-02-24 DIAGNOSIS — Z950 Presence of cardiac pacemaker: Secondary | ICD-10-CM

## 2020-02-24 DIAGNOSIS — I441 Atrioventricular block, second degree: Secondary | ICD-10-CM

## 2020-02-24 LAB — CUP PACEART INCLINIC DEVICE CHECK
Battery Remaining Longevity: 96 mo
Battery Voltage: 3.19 V
Brady Statistic RV Percent Paced: 14.12 %
Date Time Interrogation Session: 20210902095239
Implantable Pulse Generator Implant Date: 20210817
Lead Channel Impedance Value: 600 Ohm
Lead Channel Pacing Threshold Amplitude: 0.75 V
Lead Channel Pacing Threshold Pulse Width: 0.4 ms
Lead Channel Sensing Intrinsic Amplitude: 22.95 mV
Lead Channel Setting Pacing Amplitude: 2.875
Lead Channel Setting Pacing Pulse Width: 0.4 ms
Lead Channel Setting Sensing Sensitivity: 2 mV

## 2020-02-24 NOTE — Progress Notes (Signed)
Leadless check in clinic. Please see scanned/attached report.  Longevity > 8 years. VS  85.9%, VP 14.1%.   Implanted for Complete HB. Home remotes evry 3 months, next remote 05/11/20. Follow up with Dr Quentin Ore 05/15/20.

## 2020-03-07 DIAGNOSIS — H40013 Open angle with borderline findings, low risk, bilateral: Secondary | ICD-10-CM | POA: Diagnosis not present

## 2020-03-08 ENCOUNTER — Ambulatory Visit (INDEPENDENT_AMBULATORY_CARE_PROVIDER_SITE_OTHER): Payer: Medicare HMO | Admitting: Family Medicine

## 2020-03-08 DIAGNOSIS — Z5329 Procedure and treatment not carried out because of patient's decision for other reasons: Secondary | ICD-10-CM

## 2020-03-08 DIAGNOSIS — Z91199 Patient's noncompliance with other medical treatment and regimen due to unspecified reason: Secondary | ICD-10-CM | POA: Insufficient documentation

## 2020-03-08 HISTORY — DX: Patient's noncompliance with other medical treatment and regimen due to unspecified reason: Z91.199

## 2020-03-08 HISTORY — DX: Procedure and treatment not carried out because of patient's decision for other reasons: Z53.29

## 2020-03-08 NOTE — Progress Notes (Signed)
Called patient at [phone], she had forgotten the appt. No previous no shows or late cancellations. Will call the clinic to reschedule.   Diabetes: last A1c in May 2021 7.6%.   Health maintenance: due for Pap smear, PNA shot, Flu vaccine, Foot exam, and DEXA scan    [name], [place]

## 2020-03-16 ENCOUNTER — Ambulatory Visit (INDEPENDENT_AMBULATORY_CARE_PROVIDER_SITE_OTHER): Payer: Medicare HMO | Admitting: Family Medicine

## 2020-03-16 DIAGNOSIS — Z5329 Procedure and treatment not carried out because of patient's decision for other reasons: Secondary | ICD-10-CM

## 2020-03-16 NOTE — Progress Notes (Signed)
Patient no showed for appointment today.  Milus Banister, [place], PGY-3 03/16/2020 3:35 PM

## 2020-03-30 ENCOUNTER — Other Ambulatory Visit: Payer: Self-pay | Admitting: Family Medicine

## 2020-04-03 ENCOUNTER — Encounter: Payer: Self-pay | Admitting: Family Medicine

## 2020-04-03 ENCOUNTER — Ambulatory Visit (INDEPENDENT_AMBULATORY_CARE_PROVIDER_SITE_OTHER): Payer: Medicare HMO | Admitting: Family Medicine

## 2020-04-03 ENCOUNTER — Other Ambulatory Visit: Payer: Self-pay

## 2020-04-03 VITALS — BP 124/62 | HR 86 | Ht 68.0 in | Wt 235.6 lb

## 2020-04-03 DIAGNOSIS — Z23 Encounter for immunization: Secondary | ICD-10-CM

## 2020-04-03 DIAGNOSIS — Z9229 Personal history of other drug therapy: Secondary | ICD-10-CM | POA: Diagnosis not present

## 2020-04-03 DIAGNOSIS — Z7189 Other specified counseling: Secondary | ICD-10-CM

## 2020-04-03 DIAGNOSIS — E1140 Type 2 diabetes mellitus with diabetic neuropathy, unspecified: Secondary | ICD-10-CM

## 2020-04-03 LAB — POCT GLYCOSYLATED HEMOGLOBIN (HGB A1C): HbA1c, POC (controlled diabetic range): 8 % — AB (ref 0.0–7.0)

## 2020-04-03 NOTE — Patient Instructions (Addendum)
Thank you for coming in to see [place] today! Please see below to review our plan for today's visit:  1. We are checking your ParaThyroid Hormone to evaluate for elevated Calcium.  2. You are receiving your Flu and Prevnar 13 (pneumonia) shot today.  Please call the clinic at [phone] if your symptoms worsen or you have any concerns. It was our pleasure to serve you!   Dr. Milus Banister [facility]

## 2020-04-03 NOTE — Progress Notes (Addendum)
SUBJECTIVE:   CHIEF COMPLAINT / HPI:   Diabetes: last A1c in May 2021 7.6%.  Takes Metformin 1000 mg daily, the patient reports that a friend of hers lives with her and her husband.  This friend to contribute to the home purchases a lot of the food and cooks for both of them.  The patient usually keeps herself on a very strict diet, but her friend cooks a less healthy diet.  As she does not want to be removed, she eats the food anyways.  The fact, however, is about to move out of the state, and the patient reports she will then be able to resume her normal, healthier diet.  The patient is also been cleared to go back to the gym surgery.  Her previous gym closed permanently, so now she has found a new one.  Hypercalcemia: Patient has a history of incidental hypercalcemia appreciated in her labs that started about 2 years ago.  Has not had this evaluated and worked up.  Plan to recheck patient's labs (CMP, PTH) today and further evaluate.  Advanced directives: Patient is a 64 year old female without any advanced directives on file.  Health maintenance: due for Pap smear, PNA shot, Flu vaccine, Foot exam, and DEXA scan   PERTINENT  PMH / PSH:  Patient Active Problem List   Diagnosis Date Noted   Vaccination against Streptococcus pneumoniae within past 5 years 04/12/2020   Need for immunization against influenza 04/12/2020   Serum calcium elevated 04/12/2020   Advanced directives, counseling/discussion 04/12/2020   Primary hyperparathyroidism ([place]) 04/11/2020   Presbycusis of both ears 04/11/2020   No-show for appointment 03/08/2020   Pacemaker    Second degree AV block 02/08/2020   S/P total knee arthroplasty, right 11/16/2019   Pica 10/29/2019   Hypothyroidism 07/19/2019   Nausea 07/19/2019   Sinus bradycardia 07/19/2019   Screening mammogram, encounter for 07/12/2019   Hypertension associated with diabetes ([place]) 07/11/2018   Dyslipidemia associated with type 2  diabetes mellitus ([place]) 07/11/2018   Short bowel syndrome 07/11/2018   Peripheral autonomic neuropathy due to diabetes mellitus ([place]) 07/11/2018   Status post left knee replacement 07/11/2018   Major depression in remission ([place]) 07/07/2018   Primary osteoarthritis involving multiple joints 07/07/2018   Coronary artery disease involving native coronary artery of native heart without angina pectoris 07/07/2018   Paroxysmal atrial fibrillation ([place]) 05/11/2018   Mild cognitive impairment 04/30/2018   Generalized abdominal pain 02/05/2018   Venous stasis dermatitis of right lower extremity 01/02/2018   Dyslipidemia 04/15/2017   Gastroesophageal reflux disease without esophagitis 04/15/2017   B12 deficiency 12/13/2016   Malabsorption 03/12/2016   Tubular adenoma of colon 03/09/2016   Vitamin D deficiency 03/09/2016   S/P bariatric surgery-duodenal switch with sleeve gastrectomy 12/04/2013   Nonobstructive CAD  08/26/2013   Iron deficiency anemia 06/19/2012   Low back pain 03/05/2012   PSORIASIS 05/28/2010   HYPERCHOLESTEROLEMIA 03/20/2010   Peripheral neuropathy 01/12/2009   Type 2 diabetes, controlled, with neuropathy ([place]) 02/18/2008   Chronic depression 10/06/2007   OBSTRUCTIVE SLEEP APNEA 06/12/2007   GOITER, MULTINODULAR 05/13/2007     OBJECTIVE:   BP 124/62    Pulse 86    Ht 5' 8"  (1.727 m)    Wt 235 lb 9.6 oz (106.9 kg)    SpO2 99%    BMI 35.82 kg/m    Physical exam: General: Well-appearing, no apparent distress Respiratory: Comfortable work of breathing, speaking complete sentences    ASSESSMENT/PLAN:  Type 2 diabetes, controlled, with neuropathy ([place]) Patient's HbA1c 8.0%, slightly higher than previous.  Patient reports that her poorly controlled diabetes is due to her diet and not being able to control it as well. -Patient to take control of her diabetes -To continue on Metformin 1000 mg twice daily -Recheck 3 months -Consider adding  GLP or SGLT2 for better control and for cardiac/renal benefits  Need for immunization against influenza -Patient received flu vaccine today  Vaccination against Streptococcus pneumoniae within past 5 years -Patient vaccine today against strep pneumonia  Serum calcium elevated -Checking CMP for calcium, albumin -Checking PTH as per step to evaluate for hypercalcemia  Advanced directives, counseling/discussion -Patient given information regarding advanced directives, provided with packet and asked to fill this and bring it back for notary     Daisy Floro, [place]

## 2020-04-04 DIAGNOSIS — Z9229 Personal history of other drug therapy: Secondary | ICD-10-CM | POA: Diagnosis not present

## 2020-04-04 DIAGNOSIS — Z23 Encounter for immunization: Secondary | ICD-10-CM | POA: Diagnosis not present

## 2020-04-04 DIAGNOSIS — E1140 Type 2 diabetes mellitus with diabetic neuropathy, unspecified: Secondary | ICD-10-CM | POA: Diagnosis not present

## 2020-04-04 DIAGNOSIS — Z7189 Other specified counseling: Secondary | ICD-10-CM | POA: Diagnosis not present

## 2020-04-08 ENCOUNTER — Ambulatory Visit: Payer: Medicare HMO | Attending: Internal Medicine

## 2020-04-08 ENCOUNTER — Other Ambulatory Visit: Payer: Self-pay

## 2020-04-08 DIAGNOSIS — Z23 Encounter for immunization: Secondary | ICD-10-CM

## 2020-04-08 NOTE — Progress Notes (Signed)
   Covid-19 Vaccination Clinic  Name:  [name]    MRN: [id number] DOB: [birth year]  04/08/2020  [name] was observed post Covid-19 immunization for 15 minutes without incident. She was provided with Vaccine Information Sheet and instruction to access the V-Safe system.   [name] was instructed to call 911 with any severe reactions post vaccine: Marland Kitchen Difficulty breathing  . Swelling of face and throat  . A fast heartbeat  . A bad rash all over body  . Dizziness and weakness

## 2020-04-09 LAB — COMPREHENSIVE METABOLIC PANEL
ALT: 9 IU/L (ref 0–32)
AST: 12 IU/L (ref 0–40)
Albumin/Globulin Ratio: 1.6 (ref 1.2–2.2)
Albumin: 4.2 g/dL (ref 3.8–4.8)
Alkaline Phosphatase: 166 IU/L — ABNORMAL HIGH (ref 44–121)
BUN/Creatinine Ratio: 19 (ref 12–28)
BUN: 20 mg/dL (ref 8–27)
Bilirubin Total: 0.2 mg/dL (ref 0.0–1.2)
CO2: 25 mmol/L (ref 20–29)
Calcium: 11.1 mg/dL — ABNORMAL HIGH (ref 8.7–10.3)
Chloride: 104 mmol/L (ref 96–106)
Creatinine, Ser: 1.04 mg/dL — ABNORMAL HIGH (ref 0.57–1.00)
GFR calc Af Amer: 65 mL/min/{1.73_m2} (ref >59)
GFR calc non Af Amer: 57 mL/min/{1.73_m2} — ABNORMAL LOW (ref >59)
Globulin, Total: 2.6 g/dL (ref 1.5–4.5)
Glucose: 172 mg/dL — ABNORMAL HIGH (ref 65–99)
Potassium: 4.8 mmol/L (ref 3.5–5.2)
Sodium: 141 mmol/L (ref 134–144)
Total Protein: 6.8 g/dL (ref 6.0–8.5)

## 2020-04-09 LAB — INTACT PTH (INCLUDES CALCIUM)
Calcium, Serum: 10.9 mg/dL — ABNORMAL HIGH
PTH (Intact Assay): 219 pg/mL — ABNORMAL HIGH

## 2020-04-10 ENCOUNTER — Ambulatory Visit (INDEPENDENT_AMBULATORY_CARE_PROVIDER_SITE_OTHER): Payer: Medicare HMO

## 2020-04-10 ENCOUNTER — Ambulatory Visit (INDEPENDENT_AMBULATORY_CARE_PROVIDER_SITE_OTHER): Payer: Medicare HMO | Admitting: Orthopedic Surgery

## 2020-04-10 DIAGNOSIS — Z96651 Presence of right artificial knee joint: Secondary | ICD-10-CM

## 2020-04-10 DIAGNOSIS — G8929 Other chronic pain: Secondary | ICD-10-CM

## 2020-04-10 DIAGNOSIS — M5441 Lumbago with sciatica, right side: Secondary | ICD-10-CM | POA: Diagnosis not present

## 2020-04-11 ENCOUNTER — Telehealth: Payer: Self-pay | Admitting: Orthopedic Surgery

## 2020-04-11 ENCOUNTER — Encounter: Payer: Self-pay | Admitting: Family Medicine

## 2020-04-11 ENCOUNTER — Ambulatory Visit (INDEPENDENT_AMBULATORY_CARE_PROVIDER_SITE_OTHER): Payer: Medicare HMO | Admitting: Family Medicine

## 2020-04-11 ENCOUNTER — Other Ambulatory Visit: Payer: Self-pay

## 2020-04-11 VITALS — BP 140/72 | HR 70 | Ht 68.0 in | Wt 235.0 lb

## 2020-04-11 DIAGNOSIS — M2022 Hallux rigidus, left foot: Secondary | ICD-10-CM

## 2020-04-11 DIAGNOSIS — E213 Hyperparathyroidism, unspecified: Secondary | ICD-10-CM

## 2020-04-11 DIAGNOSIS — G8929 Other chronic pain: Secondary | ICD-10-CM

## 2020-04-11 DIAGNOSIS — E210 Primary hyperparathyroidism: Secondary | ICD-10-CM | POA: Diagnosis not present

## 2020-04-11 DIAGNOSIS — H9113 Presbycusis, bilateral: Secondary | ICD-10-CM

## 2020-04-11 DIAGNOSIS — M5441 Lumbago with sciatica, right side: Secondary | ICD-10-CM

## 2020-04-11 HISTORY — DX: Presbycusis, bilateral: H91.13

## 2020-04-11 HISTORY — DX: Hyperparathyroidism, unspecified: E21.3

## 2020-04-11 NOTE — Telephone Encounter (Signed)
Ok for ct myelogram thx l spine

## 2020-04-11 NOTE — Patient Instructions (Addendum)
Thank you for coming in to see [place] today! Please see below to review our plan for today's visit:  1. Look into [place] for your left great toe. Apply this when you are wearing shoes.  2. I will contact a specialist about your elevated calcium level.  3. I will place a referral to an audiologist for you.   Please call the clinic at [phone] if your symptoms worsen or you have any concerns. It was our pleasure to serve you!   Dr. Milus Banister [facility]

## 2020-04-11 NOTE — Telephone Encounter (Signed)
Please advise. Do you want to change to a CT?

## 2020-04-11 NOTE — Telephone Encounter (Signed)
Patient called requesting a call back. Patient was scheduled for MRI and the facility informed patient they can not do her MRI because she has a pacemaker and need to be set up at another MRI facility with ability to do with pacemaker. Please call patient at [phone].

## 2020-04-11 NOTE — Progress Notes (Signed)
    SUBJECTIVE:   CHIEF COMPLAINT / HPI:   Hypercalcemia: Patient with history of hypercalcemia on lab work.  Labs drawn at last office visit showing hypercalcemia with elevated parathyroid hormone, indicating most likely diagnosis of primary hyperparathyroidism.  Patient would benefit from referral to endocrinology for further work-up, evaluation, and management.  Foot pain bump: Patient describes a bump on the dorsum of her foot at the base of the big toe (first MTP).  Denies any traumas or accidents that created the bump.  She reports she is to have a bunion at the site that created a bump on the middle of the foot, now this bump seems to be on the top.  Reports that it rubs against the inside of her shoe and causes her discomfort.  She has not tried any home remedies.  Hearing loss: Patient reports 2-year history of hearing loss that has slowly and progressively worsened.  She has never been seen by an audiologist.  Once saw a person to get more information about hearing aids, was told they would cost $5000 and patient stopped pursuing anything for her hearing.  Denies any tinnitus, dizziness, headaches, vision changes.  PERTINENT  PMH / PSH:  OSA History sleeve gastrectomy (2015) Type 2 diabetes-most recent A1c 8% Paroxysmal A. fib Dyslipidemia History of right knee replacement (2021) HLD HTN Hypothyroidism   OBJECTIVE:   BP 140/72   Pulse 70   Ht 5' 8"  (1.727 m)   Wt 235 lb (106.6 kg) Comment: Patient declined. States weighed at last visit.  SpO2 98%   BMI 35.73 kg/m    Physical exam: General: Well-appearing, no apparent distress Respiratory: CTA bilaterally, comfortable work of breathing Extremities: Left foot with slight enlargement of first MTP joint, bone spurring appreciated to the space with hallucis rigidus, brisk capillary refill, neurologically intact   ASSESSMENT/PLAN:   Presbycusis of both ears -Referral to audiology placed  Primary hyperparathyroidism  [facility]) -Referral to endocrinology for further work-up placed  Hallux rigidus of left foot Hallucis rigidus with appreciated bone spurring to first MTP of left foot.  Patient only concerned that the top of her foot rubs against the inside of her shoe. -Patient instructed to purchase more skin and keep the joint covered to protect it from rubbing against the inside of her shoe -Discouraged surgery unless symptoms become significantly worse     Daisy Floro, [place]

## 2020-04-12 ENCOUNTER — Telehealth: Payer: Self-pay

## 2020-04-12 ENCOUNTER — Other Ambulatory Visit: Payer: Self-pay

## 2020-04-12 ENCOUNTER — Encounter: Payer: Self-pay | Admitting: Family Medicine

## 2020-04-12 DIAGNOSIS — M2022 Hallux rigidus, left foot: Secondary | ICD-10-CM

## 2020-04-12 DIAGNOSIS — Z23 Encounter for immunization: Secondary | ICD-10-CM | POA: Insufficient documentation

## 2020-04-12 DIAGNOSIS — G8929 Other chronic pain: Secondary | ICD-10-CM

## 2020-04-12 DIAGNOSIS — Z9229 Personal history of other drug therapy: Secondary | ICD-10-CM

## 2020-04-12 DIAGNOSIS — Z7189 Other specified counseling: Secondary | ICD-10-CM | POA: Insufficient documentation

## 2020-04-12 HISTORY — DX: Encounter for immunization: Z23

## 2020-04-12 HISTORY — DX: Hallux rigidus, left foot: M20.22

## 2020-04-12 HISTORY — DX: Other specified counseling: Z71.89

## 2020-04-12 HISTORY — DX: Hypercalcemia: E83.52

## 2020-04-12 HISTORY — DX: Personal history of other drug therapy: Z92.29

## 2020-04-12 NOTE — Assessment & Plan Note (Addendum)
Patient's HbA1c 8.0%, slightly higher than previous.  Patient reports that her poorly controlled diabetes is due to her diet and not being able to control it as well. -Patient to take control of her diabetes -To continue on Metformin 1000 mg twice daily -Recheck 3 months -Consider adding GLP or SGLT2 for better control and for cardiac/renal benefits

## 2020-04-12 NOTE — Telephone Encounter (Signed)
Phone call to patient to verify medication list and allergies for myelogram procedure. Pt instructed to hold Zoloft for 48hrs prior to myelogram appointment time and 24 hours after appointment. Pt also instructed to have a driver the day of the procedure, the procedure would take around 2 hours, and discharge instructions discussed. Pt verbalized understanding.

## 2020-04-12 NOTE — Assessment & Plan Note (Signed)
-  Patient received flu vaccine today

## 2020-04-12 NOTE — Assessment & Plan Note (Signed)
-  Referral to endocrinology for further work-up placed

## 2020-04-12 NOTE — Assessment & Plan Note (Signed)
Hallucis rigidus with appreciated bone spurring to first MTP of left foot.  Patient only concerned that the top of her foot rubs against the inside of her shoe. -Patient instructed to purchase more skin and keep the joint covered to protect it from rubbing against the inside of her shoe -Discouraged surgery unless symptoms become significantly worse

## 2020-04-12 NOTE — Assessment & Plan Note (Signed)
-  Patient vaccine today against strep pneumonia

## 2020-04-12 NOTE — Assessment & Plan Note (Signed)
-  Checking CMP for calcium, albumin -Checking PTH as per step to evaluate for hypercalcemia

## 2020-04-12 NOTE — Telephone Encounter (Signed)
Ordered. Tried calling patient to advise. No answer.

## 2020-04-12 NOTE — Assessment & Plan Note (Signed)
-  Patient given information regarding advanced directives, provided with packet and asked to fill this and bring it back for notary

## 2020-04-12 NOTE — Assessment & Plan Note (Signed)
-  Referral to audiology placed

## 2020-04-17 ENCOUNTER — Ambulatory Visit
Admission: RE | Admit: 2020-04-17 | Discharge: 2020-04-17 | Disposition: A | Discharge status: Home / Self Care | Payer: Medicare HMO | Source: Ambulatory Visit | Attending: Orthopedic Surgery | Admitting: Orthopedic Surgery

## 2020-04-17 ENCOUNTER — Other Ambulatory Visit: Payer: Self-pay

## 2020-04-17 DIAGNOSIS — M4316 Spondylolisthesis, lumbar region: Secondary | ICD-10-CM | POA: Diagnosis not present

## 2020-04-17 DIAGNOSIS — M48061 Spinal stenosis, lumbar region without neurogenic claudication: Secondary | ICD-10-CM | POA: Diagnosis not present

## 2020-04-17 DIAGNOSIS — M5441 Lumbago with sciatica, right side: Secondary | ICD-10-CM

## 2020-04-17 DIAGNOSIS — G8929 Other chronic pain: Secondary | ICD-10-CM

## 2020-04-17 DIAGNOSIS — M545 Low back pain, unspecified: Secondary | ICD-10-CM | POA: Diagnosis not present

## 2020-04-17 MED ORDER — DIAZEPAM 5 MG PO TABS
5.0000 mg | ORAL_TABLET | Freq: Once | ORAL | Status: AC
  Administered 2020-04-17: 5 mg via ORAL

## 2020-04-17 MED ORDER — IOPAMIDOL (ISOVUE-M 200) INJECTION 41%
20.0000 mL | Freq: Once | INTRAMUSCULAR | Status: AC
  Administered 2020-04-17: 20 mL via INTRATHECAL

## 2020-04-17 NOTE — Progress Notes (Signed)
Pt reports she has been off of her Zoloft for at least 48 hours.

## 2020-04-17 NOTE — Discharge Instructions (Signed)
Myelogram Discharge Instructions  1. Go home and rest quietly for the next 24 hours.  It is important to lie flat for the next 24 hours.  Get up only to go to the restroom.  You may lie in the bed or on a couch on your back, your stomach, your left side or your right side.  You may have one pillow under your head.  You may have pillows between your knees while you are on your side or under your knees while you are on your back.  2. DO NOT drive today.  Recline the seat as far back as it will go, while still wearing your seat belt, on the way home.  3. You may get up to go to the bathroom as needed.  You may sit up for 10 minutes to eat.  You may resume your normal diet and medications unless otherwise indicated.  Drink lots of extra fluids today and tomorrow.  4. The incidence of headache, nausea, or vomiting is about 5% (one in 20 patients).  If you develop a headache, lie flat and drink plenty of fluids until the headache goes away.  Caffeinated beverages may be helpful.  If you develop severe nausea and vomiting or a headache that does not go away with flat bed rest, call [phone].  5. You may resume normal activities after your 24 hours of bed rest is over; however, do not exert yourself strongly or do any heavy lifting tomorrow. If when you get up you have a headache when standing, go back to bed and force fluids for another 24 hours.  6. Call your physician for a follow-up appointment.  The results of your myelogram will be sent directly to your physician by the following day.  7. If you have any questions or if complications develop after you arrive home, please call [phone].  Discharge instructions have been explained to the patient.  The patient, or the person responsible for the patient, fully understands these instructions   YOU MAY RESTART YOUR ZOLOFT TOMORROW 10/26 AT 1 PM

## 2020-04-26 ENCOUNTER — Ambulatory Visit (INDEPENDENT_AMBULATORY_CARE_PROVIDER_SITE_OTHER): Payer: Medicare HMO | Admitting: Orthopedic Surgery

## 2020-04-26 ENCOUNTER — Other Ambulatory Visit: Payer: Self-pay

## 2020-04-26 DIAGNOSIS — M48062 Spinal stenosis, lumbar region with neurogenic claudication: Secondary | ICD-10-CM | POA: Diagnosis not present

## 2020-04-27 ENCOUNTER — Encounter: Payer: Self-pay | Admitting: Orthopedic Surgery

## 2020-04-27 ENCOUNTER — Telehealth: Payer: Self-pay

## 2020-04-27 NOTE — Progress Notes (Signed)
Office Visit Note   Patient: [name]           Date of Birth: [birth year]           MRN: [id number] Visit Date: 04/26/2020 Requested by: [address], [address],  [place] [address] PCP: Daisy Floro, DO  Subjective: Chief Complaint  Patient presents with  . scan review    HPI: [name] is a 64 year old patient with back pain and symptoms consistent with neurogenic claudication. Since have seen her she has had CT scan which was a CT myelogram. Continues to report back pain. CT myelogram is reviewed. She has fairly significant degenerative spinal stenosis at L3-4 which is likely giving her her symptoms. Also has moderate SI joint arthritis.              ROS: All systems reviewed are negative as they relate to the chief complaint within the history of present illness.  Patient denies  fevers or chills.   Assessment & Plan: Visit Diagnoses:  1. Spinal stenosis of lumbar region with neurogenic claudication     Plan: Impression is lumbar region spinal stenosis with neurogenic claudication. Had a long talk with her today about treatment options. Knees are doing well. Plan for referral to Dr. Ernestina Patches for lumbar spine ESI as a first start at treating this symptomatically. This is more of a management problem. Patient understands. She is doing well with her knees. Follow-up with [place] as needed. Follow-Up Instructions: No follow-ups on file.   Orders:  No orders of the defined types were placed in this encounter.  No orders of the defined types were placed in this encounter.     Procedures: No procedures performed   Clinical Data: No additional findings.  Objective: Vital Signs: There were no vitals taken for this visit.  Physical Exam:   Constitutional: Patient appears well-developed HEENT:  Head: Normocephalic Eyes:EOM are normal Neck: Normal range of motion Cardiovascular: Normal rate Pulmonary/chest: Effort normal Neurologic: Patient is  alert Skin: Skin is warm Psychiatric: Patient has normal mood and affect    Ortho Exam: Ortho exam demonstrates palpable pedal pulses. No groin pain with internal X rotation leg. Good quad strength bilaterally. No definite paresthesias in the legs today. Mild pain with forward lateral bending. No discrete SI joint tenderness today with negative Ober testing. Normal gait. No muscle atrophy outside of that expected from recent total knee replacement.  Specialty Comments:  No specialty comments available.  Imaging: No results found.   PMFS History: Patient Active Problem List   Diagnosis Date Noted  . Vaccination against Streptococcus pneumoniae within past 5 years 04/12/2020  . Need for immunization against influenza 04/12/2020  . Serum calcium elevated 04/12/2020  . Advanced directives, counseling/discussion 04/12/2020  . Hallux rigidus of left foot 04/12/2020  . Primary hyperparathyroidism ([place]) 04/11/2020  . Presbycusis of both ears 04/11/2020  . No-show for appointment 03/08/2020  . Pacemaker   . Second degree AV block 02/08/2020  . S/P total knee arthroplasty, right 11/16/2019  . Pica 10/29/2019  . Hypothyroidism 07/19/2019  . Nausea 07/19/2019  . Sinus bradycardia 07/19/2019  . Screening mammogram, encounter for 07/12/2019  . Hypertension associated with diabetes ([place]) 07/11/2018  . Dyslipidemia associated with type 2 diabetes mellitus ([place]) 07/11/2018  . Short bowel syndrome 07/11/2018  . Peripheral autonomic neuropathy due to diabetes mellitus ([place]) 07/11/2018  . Status post left knee replacement 07/11/2018  . Major depression in remission ([place]) 07/07/2018  .  Primary osteoarthritis involving multiple joints 07/07/2018  . Coronary artery disease involving native coronary artery of native heart without angina pectoris 07/07/2018  . Paroxysmal atrial fibrillation ([place]) 05/11/2018  . Mild cognitive impairment 04/30/2018  . Generalized abdominal pain 02/05/2018  . Venous  stasis dermatitis of right lower extremity 01/02/2018  . Dyslipidemia 04/15/2017  . Gastroesophageal reflux disease without esophagitis 04/15/2017  . B12 deficiency 12/13/2016  . Malabsorption 03/12/2016  . Tubular adenoma of colon 03/09/2016  . Vitamin D deficiency 03/09/2016  . S/P bariatric surgery-duodenal switch with sleeve gastrectomy 12/04/2013  . Nonobstructive CAD  08/26/2013  . Iron deficiency anemia 06/19/2012  . Low back pain 03/05/2012  . PSORIASIS 05/28/2010  . HYPERCHOLESTEROLEMIA 03/20/2010  . Peripheral neuropathy 01/12/2009  . Type 2 diabetes, controlled, with neuropathy ([place]) 02/18/2008  . Chronic depression 10/06/2007  . OBSTRUCTIVE SLEEP APNEA 06/12/2007  . GOITER, MULTINODULAR 05/13/2007   Past Medical History:  Diagnosis Date  . Allergy   . Anemia   . ANEMIA, PERNICIOUS, HX OF 05/13/2007  . Anxiety   . Arthritis   . ASYMPTOMATIC POSTMENOPAUSAL STATUS 02/18/2008  . BACK PAIN, LUMBAR 11/19/2007  . Blood in urine 07/19/2019  . Bowel incontinence   . Cataract   . Cellulitis of left lower leg 10/28/2013  . Cellulitis of leg, right 10/11/2010  . Chronic dermatitis of hands 05/02/2009  . DEPRESSION, CHRONIC 10/06/2007  . Diabetes mellitus without complication ([place])   . DIABETES MELLITUS, WITH NEUROLOGICAL COMPLICATIONS 07/26/5425  . Diabetic neuropathy ([place])   . DYSLIPIDEMIA 05/13/2007  . Essential hypertension 03/20/2010  . Family history of colon cancer   . Fecal incontinence 08/02/2015  . Frozen shoulder    Lt  . Full dentures   . Genetic testing 11/24/2017   Negative genetic testing on the common hereditary cancer panel.  The Hereditary Gene Panel offered by [facility] includes sequencing and/or deletion duplication testing of the following 47 genes: APC, ATM, AXIN2, BARD1, BMPR1A, BRCA1, BRCA2, BRIP1, CDH1, CDK4, CDKN2A (p14ARF), CDKN2A (p16INK4a), CHEK2, CTNNA1, DICER1, EPCAM (Deletion/duplication testing only), GREM1 (promoter region deletion/duplicat  . GERD  (gastroesophageal reflux disease)   . GI bleed 03/15/2016  . GOITER, MULTINODULAR 05/13/2007  . Headache   . History of blood transfusion   . Hx of colonic polyps 12/04/2010  . HYPERCHOLESTEROLEMIA 03/20/2010  . HYPERTENSION 03/20/2010  . Lower back pain   . NASH (nonalcoholic steatohepatitis)   . OA (osteoarthritis)   . OBSTRUCTIVE SLEEP APNEA 06/12/2007   uses CPAP.  Marland Kitchen PERIPHERAL NEUROPATHY 01/12/2009  . Peripheral vascular disease ([place])   . Primary osteoarthritis of left knee 04/23/2011   Previously seen by Dr. Alphonzo Severance. Will plan on repeat referral after trial injection.     Marland Kitchen PSORIASIS 05/28/2010  . Rectal prolapse 05/24/2015  . Rectovaginal fistula 05/25/2014  . Renal insufficiency    stage 1 kd  . Spigelian hernia 02/05/2018  . Stress 05/24/2015  . Surgical counseling visit 10/02/2019  . Umbilical hernia without obstruction and without gangrene 04/15/2017  . Unilateral primary osteoarthritis, left knee   . UNSPECIFIED VENOUS INSUFFICIENCY 05/28/2010   Patient with frequent ulceration related to venous stasis-seen at wound care. Edema and Venous stasis changes due to this . Echo 04/16/11 with EF 60%. No signs diastolic dysfunction.       . Vaginal irritation 07/19/2019  . Wears glasses     Family History  Problem Relation Age of Onset  . Hyperlipidemia Father   . Hypertension Father   . Cirrhosis Father   .  Colon cancer Father 7       d. 9  . Colon cancer Sister 54       d. 51  . Bipolar disorder Sister   . Aneurysm Mother        brain  . Cerebral aneurysm Mother   . Colon cancer Sister 57       d. 42  . Bipolar disorder Sister   . Cancer Paternal Aunt        NOS, ? colon  . Congestive Heart Failure Maternal Grandmother   . Drug abuse Neg Hx   . CAD Neg Hx   . Stomach cancer Neg Hx     Past Surgical History:  Procedure Laterality Date  . CATARACT EXTRACTION W/ INTRAOCULAR LENS  IMPLANT, BILATERAL    . COLONOSCOPY    . ELECTROCARDIOGRAM  04/16/2006  .  EXAMINATION UNDER ANESTHESIA N/A 06/29/2014   Procedure: EXAM UNDER ANESTHESIA;  Surgeon: Janyth Contes, MD;  Location: [facility];  Service: Gynecology;  Laterality: N/A;  . Exercise myoview  01/24/2005  . FLEXIBLE SIGMOIDOSCOPY    . GASTRIC BYPASS  11/09/2013  . GASTRIC BYPASS    . JOINT REPLACEMENT    . KNEE ARTHROSCOPY  2003   right  . LAPAROSCOPY N/A 03/12/2016   Procedure: LAPAROSCOPIC ANASTOMOSIS OF INTESTINE (ENTEROENTEROSTOMY);  Surgeon: Ladora Daniel, MD;  Location: [facility];  Service: General;  Laterality: N/A;  . LEG SURGERY Left    metal and pins in lower left leg  . mrsa Right    arm  . MULTIPLE TOOTH EXTRACTIONS    . PACEMAKER LEADLESS INSERTION N/A 02/08/2020   Procedure: PACEMAKER LEADLESS INSERTION;  Surgeon: Thompson Grayer, MD;  Location: [facility];  Service: Cardiovascular;  Laterality: N/A;  . SHOULDER ARTHROSCOPY W/ ROTATOR CUFF REPAIR Right   . stab phlebectomy  Right 02/10/2019   stab phlebectomy > 20 incisions right leg by [name] MD   . TONSILLECTOMY    . TOTAL KNEE ARTHROPLASTY Left 06/30/2018   Procedure: LEFT TOTAL KNEE ARTHROPLASTY;  Surgeon: Meredith Pel, MD;  Location: [facility];  Service: Orthopedics;  Laterality: Left;  . TOTAL KNEE ARTHROPLASTY Right 11/16/2019  . TOTAL KNEE ARTHROPLASTY Right 11/16/2019   Procedure: RIGHT TOTAL KNEE ARTHROPLASTY-CEMENTED;  Surgeon: Meredith Pel, MD;  Location: [facility];  Service: Orthopedics;  Laterality: Right;  Marland Kitchen VARICOSE VEIN SURGERY     Remotef   Social History   Occupational History  . Occupation: Unemployed    Comment: disabled  Tobacco Use  . Smoking status: Former Smoker    Packs/day: 2.00    Years: 24.00    Pack years: 48.00    Types: Cigarettes    Start date: 06/24/1970    Quit date: 08/06/1994    Years since quitting: 25.7  . Smokeless tobacco: Never Used  Vaping Use  . Vaping Use: Never used  Substance and Sexual Activity  . Alcohol use: No    Alcohol/week: 0.0 standard  drinks  . Drug use: No  . Sexual activity: Not Currently

## 2020-04-27 NOTE — Telephone Encounter (Signed)
no message attached

## 2020-04-27 NOTE — Addendum Note (Signed)
Addended byLaurann Montana on: 04/27/2020 08:23 AM   Modules accepted: Orders

## 2020-04-28 NOTE — Telephone Encounter (Signed)
Error

## 2020-04-30 ENCOUNTER — Encounter: Payer: Self-pay | Admitting: Orthopedic Surgery

## 2020-04-30 NOTE — Progress Notes (Signed)
Office Visit Note   Patient: [name]           Date of Birth: [birth year]           MRN: [id number] Visit Date: 04/10/2020 Requested by: [place], [address],  [place] [address] PCP: Daisy Floro, DO  Subjective: Chief Complaint  Patient presents with  . Right Knee - Follow-up    HPI: [name] is a 64 year old patient with right sided low back pain and right-sided sciatica. She is doing well with her right knee replacement but is having some pain radiating in the right leg. Has a known history of back issues. Also has a known history of scoliosis. In general her knees are doing well but she reports fairly distinctive radicular symptoms affecting her back radiating down the leg. Not much in the way of left-sided symptoms. No bowel or bladder symptoms or fevers or chills.              ROS: All systems reviewed are negative as they relate to the chief complaint within the history of present illness.  Patient denies  fevers or chills.   Assessment & Plan: Visit Diagnoses:  1. S/P total knee arthroplasty, right   2. Chronic right-sided low back pain with right-sided sciatica     Plan: Impression is right-sided low back pain with right hip radiographs which show no arthritis in right knee radiographs which showed the knee prosthesis to be in good position. Exam wise the knee is doing well. I think [name] likely has right-sided radiculopathy. CT myelogram ordered with likely ESI to follow. Follow-up after that study.  Follow-Up Instructions: No follow-ups on file.   Orders:  Orders Placed This Encounter  Procedures  . XR HIP UNILAT W OR W/O PELVIS 2-3 VIEWS RIGHT  . XR Knee 1-2 Views Right  . XR Lumbar Spine 2-3 Views   No orders of the defined types were placed in this encounter.     Procedures: No procedures performed   Clinical Data: No additional findings.  Objective: Vital Signs: There were no vitals taken for this  visit.  Physical Exam:   Constitutional: Patient appears well-developed HEENT:  Head: Normocephalic Eyes:EOM are normal Neck: Normal range of motion Cardiovascular: Normal rate Pulmonary/chest: Effort normal Neurologic: Patient is alert Skin: Skin is warm Psychiatric: Patient has normal mood and affect    Ortho Exam: Ortho exam demonstrates normal gait alignment. No Trendelenburg gait. No groin pain with internal extra rotation of either leg. No masses lymphadenopathy or skin changes noted in that right knee region. No effusion in the right knee. Extensor mechanism is intact. Mildly positive nerve root tension signs on the right compared to the left. No definite paresthesias L1 S1 bilaterally. Mild pain with forward lateral bending but no trochanteric tenderness is noted. Slight scoliosis noted on observation of the lumbar spine.  Specialty Comments:  No specialty comments available.  Imaging: No results found.   PMFS History: Patient Active Problem List   Diagnosis Date Noted  . Vaccination against Streptococcus pneumoniae within past 5 years 04/12/2020  . Need for immunization against influenza 04/12/2020  . Serum calcium elevated 04/12/2020  . Advanced directives, counseling/discussion 04/12/2020  . Hallux rigidus of left foot 04/12/2020  . Primary hyperparathyroidism ([place]) 04/11/2020  . Presbycusis of both ears 04/11/2020  . No-show for appointment 03/08/2020  . Pacemaker   . Second degree AV block 02/08/2020  . S/P total knee arthroplasty, right 11/16/2019  .  Pica 10/29/2019  . Hypothyroidism 07/19/2019  . Nausea 07/19/2019  . Sinus bradycardia 07/19/2019  . Screening mammogram, encounter for 07/12/2019  . Hypertension associated with diabetes ([place]) 07/11/2018  . Dyslipidemia associated with type 2 diabetes mellitus ([place]) 07/11/2018  . Short bowel syndrome 07/11/2018  . Peripheral autonomic neuropathy due to diabetes mellitus ([place]) 07/11/2018  . Status post left  knee replacement 07/11/2018  . Major depression in remission ([place]) 07/07/2018  . Primary osteoarthritis involving multiple joints 07/07/2018  . Coronary artery disease involving native coronary artery of native heart without angina pectoris 07/07/2018  . Paroxysmal atrial fibrillation ([place]) 05/11/2018  . Mild cognitive impairment 04/30/2018  . Generalized abdominal pain 02/05/2018  . Venous stasis dermatitis of right lower extremity 01/02/2018  . Dyslipidemia 04/15/2017  . Gastroesophageal reflux disease without esophagitis 04/15/2017  . B12 deficiency 12/13/2016  . Malabsorption 03/12/2016  . Tubular adenoma of colon 03/09/2016  . Vitamin D deficiency 03/09/2016  . S/P bariatric surgery-duodenal switch with sleeve gastrectomy 12/04/2013  . Nonobstructive CAD  08/26/2013  . Iron deficiency anemia 06/19/2012  . Low back pain 03/05/2012  . PSORIASIS 05/28/2010  . HYPERCHOLESTEROLEMIA 03/20/2010  . Peripheral neuropathy 01/12/2009  . Type 2 diabetes, controlled, with neuropathy ([place]) 02/18/2008  . Chronic depression 10/06/2007  . OBSTRUCTIVE SLEEP APNEA 06/12/2007  . GOITER, MULTINODULAR 05/13/2007   Past Medical History:  Diagnosis Date  . Allergy   . Anemia   . ANEMIA, PERNICIOUS, HX OF 05/13/2007  . Anxiety   . Arthritis   . ASYMPTOMATIC POSTMENOPAUSAL STATUS 02/18/2008  . BACK PAIN, LUMBAR 11/19/2007  . Blood in urine 07/19/2019  . Bowel incontinence   . Cataract   . Cellulitis of left lower leg 10/28/2013  . Cellulitis of leg, right 10/11/2010  . Chronic dermatitis of hands 05/02/2009  . DEPRESSION, CHRONIC 10/06/2007  . Diabetes mellitus without complication ([place])   . DIABETES MELLITUS, WITH NEUROLOGICAL COMPLICATIONS 8/41/6606  . Diabetic neuropathy ([place])   . DYSLIPIDEMIA 05/13/2007  . Essential hypertension 03/20/2010  . Family history of colon cancer   . Fecal incontinence 08/02/2015  . Frozen shoulder    Lt  . Full dentures   . Genetic testing 11/24/2017   Negative  genetic testing on the common hereditary cancer panel.  The Hereditary Gene Panel offered by [facility] includes sequencing and/or deletion duplication testing of the following 47 genes: APC, ATM, AXIN2, BARD1, BMPR1A, BRCA1, BRCA2, BRIP1, CDH1, CDK4, CDKN2A (p14ARF), CDKN2A (p16INK4a), CHEK2, CTNNA1, DICER1, EPCAM (Deletion/duplication testing only), GREM1 (promoter region deletion/duplicat  . GERD (gastroesophageal reflux disease)   . GI bleed 03/15/2016  . GOITER, MULTINODULAR 05/13/2007  . Headache   . History of blood transfusion   . Hx of colonic polyps 12/04/2010  . HYPERCHOLESTEROLEMIA 03/20/2010  . HYPERTENSION 03/20/2010  . Lower back pain   . NASH (nonalcoholic steatohepatitis)   . OA (osteoarthritis)   . OBSTRUCTIVE SLEEP APNEA 06/12/2007   uses CPAP.  Marland Kitchen PERIPHERAL NEUROPATHY 01/12/2009  . Peripheral vascular disease ([place])   . Primary osteoarthritis of left knee 04/23/2011   Previously seen by Dr. Alphonzo Severance. Will plan on repeat referral after trial injection.     Marland Kitchen PSORIASIS 05/28/2010  . Rectal prolapse 05/24/2015  . Rectovaginal fistula 05/25/2014  . Renal insufficiency    stage 1 kd  . Spigelian hernia 02/05/2018  . Stress 05/24/2015  . Surgical counseling visit 10/02/2019  . Umbilical hernia without obstruction and without gangrene 04/15/2017  . Unilateral primary osteoarthritis, left knee   .  UNSPECIFIED VENOUS INSUFFICIENCY 05/28/2010   Patient with frequent ulceration related to venous stasis-seen at wound care. Edema and Venous stasis changes due to this . Echo 04/16/11 with EF 60%. No signs diastolic dysfunction.       . Vaginal irritation 07/19/2019  . Wears glasses     Family History  Problem Relation Age of Onset  . Hyperlipidemia Father   . Hypertension Father   . Cirrhosis Father   . Colon cancer Father 71       d. 74  . Colon cancer Sister 56       d. 30  . Bipolar disorder Sister   . Aneurysm Mother        brain  . Cerebral aneurysm Mother   . Colon cancer  Sister 25       d. 22  . Bipolar disorder Sister   . Cancer Paternal Aunt        NOS, ? colon  . Congestive Heart Failure Maternal Grandmother   . Drug abuse Neg Hx   . CAD Neg Hx   . Stomach cancer Neg Hx     Past Surgical History:  Procedure Laterality Date  . CATARACT EXTRACTION W/ INTRAOCULAR LENS  IMPLANT, BILATERAL    . COLONOSCOPY    . ELECTROCARDIOGRAM  04/16/2006  . EXAMINATION UNDER ANESTHESIA N/A 06/29/2014   Procedure: EXAM UNDER ANESTHESIA;  Surgeon: Janyth Contes, MD;  Location: [facility];  Service: Gynecology;  Laterality: N/A;  . Exercise myoview  01/24/2005  . FLEXIBLE SIGMOIDOSCOPY    . GASTRIC BYPASS  11/09/2013  . GASTRIC BYPASS    . JOINT REPLACEMENT    . KNEE ARTHROSCOPY  2003   right  . LAPAROSCOPY N/A 03/12/2016   Procedure: LAPAROSCOPIC ANASTOMOSIS OF INTESTINE (ENTEROENTEROSTOMY);  Surgeon: Ladora Daniel, MD;  Location: [facility];  Service: General;  Laterality: N/A;  . LEG SURGERY Left    metal and pins in lower left leg  . mrsa Right    arm  . MULTIPLE TOOTH EXTRACTIONS    . PACEMAKER LEADLESS INSERTION N/A 02/08/2020   Procedure: PACEMAKER LEADLESS INSERTION;  Surgeon: Thompson Grayer, MD;  Location: [facility];  Service: Cardiovascular;  Laterality: N/A;  . SHOULDER ARTHROSCOPY W/ ROTATOR CUFF REPAIR Right   . stab phlebectomy  Right 02/10/2019   stab phlebectomy > 20 incisions right leg by [name] MD   . TONSILLECTOMY    . TOTAL KNEE ARTHROPLASTY Left 06/30/2018   Procedure: LEFT TOTAL KNEE ARTHROPLASTY;  Surgeon: Meredith Pel, MD;  Location: [facility];  Service: Orthopedics;  Laterality: Left;  . TOTAL KNEE ARTHROPLASTY Right 11/16/2019  . TOTAL KNEE ARTHROPLASTY Right 11/16/2019   Procedure: RIGHT TOTAL KNEE ARTHROPLASTY-CEMENTED;  Surgeon: Meredith Pel, MD;  Location: [facility];  Service: Orthopedics;  Laterality: Right;  Marland Kitchen VARICOSE VEIN SURGERY     Remotef   Social History   Occupational History  . Occupation:  Unemployed    Comment: disabled  Tobacco Use  . Smoking status: Former Smoker    Packs/day: 2.00    Years: 24.00    Pack years: 48.00    Types: Cigarettes    Start date: 06/24/1970    Quit date: 08/06/1994    Years since quitting: 25.7  . Smokeless tobacco: Never Used  Vaping Use  . Vaping Use: Never used  Substance and Sexual Activity  . Alcohol use: No    Alcohol/week: 0.0 standard drinks  . Drug use: No  . Sexual  activity: Not Currently

## 2020-05-11 ENCOUNTER — Ambulatory Visit (INDEPENDENT_AMBULATORY_CARE_PROVIDER_SITE_OTHER): Payer: Medicare HMO

## 2020-05-11 DIAGNOSIS — I441 Atrioventricular block, second degree: Secondary | ICD-10-CM | POA: Diagnosis not present

## 2020-05-12 ENCOUNTER — Ambulatory Visit (INDEPENDENT_AMBULATORY_CARE_PROVIDER_SITE_OTHER): Payer: Medicare HMO | Admitting: Endocrinology

## 2020-05-12 ENCOUNTER — Other Ambulatory Visit: Payer: Self-pay

## 2020-05-12 ENCOUNTER — Encounter: Payer: Self-pay | Admitting: Endocrinology

## 2020-05-12 LAB — CUP PACEART REMOTE DEVICE CHECK
Battery Remaining Longevity: 96 mo
Battery Voltage: 3.16 V
Brady Statistic RV Percent Paced: 12.41 %
Date Time Interrogation Session: 20211118224444
Implantable Pulse Generator Implant Date: 20210817
Lead Channel Impedance Value: 470 Ohm
Lead Channel Pacing Threshold Amplitude: 0.625 V
Lead Channel Pacing Threshold Pulse Width: 0.4 ms
Lead Channel Sensing Intrinsic Amplitude: 23.063 mV
Lead Channel Setting Pacing Amplitude: 2.125
Lead Channel Setting Pacing Pulse Width: 0.4 ms
Lead Channel Setting Sensing Sensitivity: 2 mV

## 2020-05-12 LAB — T4, FREE: Free T4: 0.92 ng/dL (ref 0.60–1.60)

## 2020-05-12 LAB — VITAMIN D 25 HYDROXY (VIT D DEFICIENCY, FRACTURES): VITD: 21.04 ng/mL — ABNORMAL LOW (ref 30.00–100.00)

## 2020-05-12 LAB — TSH: TSH: 2.73 u[IU]/mL (ref 0.35–4.50)

## 2020-05-12 MED ORDER — FUROSEMIDE 20 MG PO TABS: 20.0000 mg | ORAL_TABLET | Freq: Every day | ORAL | 3 refills | Status: DC

## 2020-05-12 NOTE — Patient Instructions (Addendum)
Blood tests are requested for you today.  We'll let you know about the results.  I have sent a prescription to your pharmacy, for an additional fluid pill We will need to take this complex situation in stages. Please come back for a follow-up appointment in 2-3 weeks.

## 2020-05-12 NOTE — Progress Notes (Signed)
Subjective:    Patient ID: [name], female    DOB: 1955/12/21, 64 y.o.   MRN: [id number]  HPI Pt is referred by Dr Owens Shark, for hypercalcemia.  Pt was noted to have hypercalcemia in 2019.  she has never had osteoporosis, urolithiasis, thyroid probs, sarcoidosis, cancer, PUD, pancreatitis, or bony fracture.  He does not take vitamin-D or A supplements.  Pt denies taking antacids or Li++.  She takes zestoretic.  She had gastric bypass in 2015.  She has chronic back pain and foot numbness. Past Medical History:  Diagnosis Date  . Allergy   . Anemia   . ANEMIA, PERNICIOUS, HX OF 05/13/2007  . Anxiety   . Arthritis   . ASYMPTOMATIC POSTMENOPAUSAL STATUS 02/18/2008  . BACK PAIN, LUMBAR 11/19/2007  . Blood in urine 07/19/2019  . Bowel incontinence   . Cataract   . Cellulitis of left lower leg 10/28/2013  . Cellulitis of leg, right 10/11/2010  . Chronic dermatitis of hands 05/02/2009  . DEPRESSION, CHRONIC 10/06/2007  . Diabetes mellitus without complication ([place])   . DIABETES MELLITUS, WITH NEUROLOGICAL COMPLICATIONS 09/30/8117  . Diabetic neuropathy ([place])   . DYSLIPIDEMIA 05/13/2007  . Essential hypertension 03/20/2010  . Family history of colon cancer   . Fecal incontinence 08/02/2015  . Frozen shoulder    Lt  . Full dentures   . Genetic testing 11/24/2017   Negative genetic testing on the common hereditary cancer panel.  The Hereditary Gene Panel offered by [facility] includes sequencing and/or deletion duplication testing of the following 47 genes: APC, ATM, AXIN2, BARD1, BMPR1A, BRCA1, BRCA2, BRIP1, CDH1, CDK4, CDKN2A (p14ARF), CDKN2A (p16INK4a), CHEK2, CTNNA1, DICER1, EPCAM (Deletion/duplication testing only), GREM1 (promoter region deletion/duplicat  . GERD (gastroesophageal reflux disease)   . GI bleed 03/15/2016  . GOITER, MULTINODULAR 05/13/2007  . Headache   . History of blood transfusion   . Hx of colonic polyps 12/04/2010  . HYPERCHOLESTEROLEMIA 03/20/2010  . HYPERTENSION 03/20/2010    . Lower back pain   . NASH (nonalcoholic steatohepatitis)   . OA (osteoarthritis)   . OBSTRUCTIVE SLEEP APNEA 06/12/2007   uses CPAP.  Marland Kitchen PERIPHERAL NEUROPATHY 01/12/2009  . Peripheral vascular disease ([place])   . Primary osteoarthritis of left knee 04/23/2011   Previously seen by Dr. Alphonzo Severance. Will plan on repeat referral after trial injection.     Marland Kitchen PSORIASIS 05/28/2010  . Rectal prolapse 05/24/2015  . Rectovaginal fistula 05/25/2014  . Renal insufficiency    stage 1 kd  . Spigelian hernia 02/05/2018  . Stress 05/24/2015  . Surgical counseling visit 10/02/2019  . Umbilical hernia without obstruction and without gangrene 04/15/2017  . Unilateral primary osteoarthritis, left knee   . UNSPECIFIED VENOUS INSUFFICIENCY 05/28/2010   Patient with frequent ulceration related to venous stasis-seen at wound care. Edema and Venous stasis changes due to this . Echo 04/16/11 with EF 60%. No signs diastolic dysfunction.       . Vaginal irritation 07/19/2019  . Wears glasses     Past Surgical History:  Procedure Laterality Date  . CATARACT EXTRACTION W/ INTRAOCULAR LENS  IMPLANT, BILATERAL    . COLONOSCOPY    . ELECTROCARDIOGRAM  04/16/2006  . EXAMINATION UNDER ANESTHESIA N/A 06/29/2014   Procedure: EXAM UNDER ANESTHESIA;  Surgeon: Janyth Contes, MD;  Location: [facility];  Service: Gynecology;  Laterality: N/A;  . Exercise myoview  01/24/2005  . FLEXIBLE SIGMOIDOSCOPY    . GASTRIC BYPASS  11/09/2013  . GASTRIC BYPASS    . JOINT  REPLACEMENT    . KNEE ARTHROSCOPY  2003   right  . LAPAROSCOPY N/A 03/12/2016   Procedure: LAPAROSCOPIC ANASTOMOSIS OF INTESTINE (ENTEROENTEROSTOMY);  Surgeon: Ladora Daniel, MD;  Location: [facility];  Service: General;  Laterality: N/A;  . LEG SURGERY Left    metal and pins in lower left leg  . mrsa Right    arm  . MULTIPLE TOOTH EXTRACTIONS    . PACEMAKER LEADLESS INSERTION N/A 02/08/2020   Procedure: PACEMAKER LEADLESS INSERTION;  Surgeon: Thompson Grayer, MD;   Location: [facility];  Service: Cardiovascular;  Laterality: N/A;  . SHOULDER ARTHROSCOPY W/ ROTATOR CUFF REPAIR Right   . stab phlebectomy  Right 02/10/2019   stab phlebectomy > 20 incisions right leg by [name] MD   . TONSILLECTOMY    . TOTAL KNEE ARTHROPLASTY Left 06/30/2018   Procedure: LEFT TOTAL KNEE ARTHROPLASTY;  Surgeon: Meredith Pel, MD;  Location: [facility];  Service: Orthopedics;  Laterality: Left;  . TOTAL KNEE ARTHROPLASTY Right 11/16/2019  . TOTAL KNEE ARTHROPLASTY Right 11/16/2019   Procedure: RIGHT TOTAL KNEE ARTHROPLASTY-CEMENTED;  Surgeon: Meredith Pel, MD;  Location: [facility];  Service: Orthopedics;  Laterality: Right;  Marland Kitchen VARICOSE VEIN SURGERY     Remotef    Social History   Socioeconomic History  . Marital status: Married    Spouse name: Marya Amsler  . Number of children: 2  . Years of education: [employer]  . Highest education level: Not on file  Occupational History  . Occupation: Unemployed    Comment: disabled  Tobacco Use  . Smoking status: Former Smoker    Packs/day: 2.00    Years: 24.00    Pack years: 48.00    Types: Cigarettes    Start date: 06/24/1970    Quit date: 08/06/1994    Years since quitting: 25.7  . Smokeless tobacco: Never Used  Vaping Use  . Vaping Use: Never used  Substance and Sexual Activity  . Alcohol use: No    Alcohol/week: 0.0 standard drinks  . Drug use: No  . Sexual activity: Not Currently  Other Topics Concern  . Not on file  Social History Narrative   Married '91, husband has MS, wheelchair bound   [id number] x 2, 1 son '91 8.5lbs, 1 daughter '92 10lbs      Caffeine use- coffee 3-4 cups daily   Social Determinants of Health   Financial Resource Strain:   . Difficulty of Paying Living Expenses: Not on file  Food Insecurity:   . Worried About Charity fundraiser in the Last Year: Not on file  . Ran Out of Food in the Last Year: Not on file  Transportation Needs:   . Lack of Transportation (Medical):  Not on file  . Lack of Transportation (Non-Medical): Not on file  Physical Activity:   . Days of Exercise per Week: Not on file  . Minutes of Exercise per Session: Not on file  Stress:   . Feeling of Stress : Not on file  Social Connections:   . Frequency of Communication with Friends and Family: Not on file  . Frequency of Social Gatherings with Friends and Family: Not on file  . Attends Religious Services: Not on file  . Active Member of Clubs or Organizations: Not on file  . Attends Archivist Meetings: Not on file  . Marital Status: Not on file  Intimate Partner Violence:   . Fear of Current or Ex-Partner: Not on file  .  Emotionally Abused: Not on file  . Physically Abused: Not on file  . Sexually Abused: Not on file    Current Outpatient Medications on File Prior to Visit  Medication Sig Dispense Refill  . acetaminophen (TYLENOL) 325 MG tablet Take 2 tablets (650 mg total) by mouth every 4 (four) hours as needed for headache or mild pain.    Marland Kitchen amLODipine (NORVASC) 10 MG tablet TAKE 1 TABLET BY MOUTH EVERY DAY (Patient taking differently: Take 10 mg by mouth daily. ) 90 tablet 0  . apixaban (ELIQUIS) 5 MG TABS tablet Take 1 tablet (5 mg total) by mouth 2 (two) times daily. Resume with EVENING dose today. 60 tablet 0  . atorvastatin (LIPITOR) 40 MG tablet TAKE 1 TABLET BY MOUTH EVERY DAY (Patient taking differently: Take 40 mg by mouth daily. ) 90 tablet 0  . celecoxib (CELEBREX) 200 MG capsule Take 1 capsule (200 mg total) by mouth 2 (two) times daily. 60 capsule 0  . ferrous sulfate 324 (65 Fe) MG TBEC TAKE 1 TABLET BY MOUTH EVERY OTHER DAY 180 tablet 1  . gabapentin (NEURONTIN) 100 MG capsule Take 100 mg by mouth every morning.    . gabapentin (NEURONTIN) 300 MG capsule TAKE 1 CAPSULE BY MOUTH THREE TIMES A DAY 270 capsule 3  . glucose blood (ACCU-CHEK AVIVA PLUS) test strip Use to check sugar once daily in the morning 100 each 0  . lisinopril-hydrochlorothiazide  (ZESTORETIC) 20-25 MG tablet Take 1 tablet by mouth daily. 90 tablet 3  . metFORMIN (GLUCOPHAGE) 1000 MG tablet Take 1 tablet (1,000 mg total) by mouth daily with breakfast. 90 tablet 2  . methocarbamol (ROBAXIN) 500 MG tablet Take 1 tablet (500 mg total) by mouth every 8 (eight) hours as needed for muscle spasms. 30 tablet 0  . methocarbamol (ROBAXIN) 500 MG tablet TAKE 1 TABLET BY MOUTH EVERY 6 HOURS AS NEEDED FOR MUSCLE SPASM (Patient taking differently: Take 500 mg by mouth every 6 (six) hours as needed for muscle spasms. ) 120 tablet 3  . multivitamin-iron-minerals-folic acid (CENTRUM) chewable tablet Chew 1 tablet by mouth daily.     . NON FORMULARY Diet type:  NAS    . oxyCODONE (OXY IR/ROXICODONE) 5 MG immediate release tablet Take 1 tablet (5 mg total) by mouth every 4 (four) hours as needed for severe pain. 35 tablet 0  . sertraline (ZOLOFT) 100 MG tablet Take 1 tablet (100 mg total) by mouth daily. 90 tablet 1  . Teduglutide, rDNA, (GATTEX) 5 MG KIT Inject 5 mg into the skin daily.     Marland Kitchen spironolactone (ALDACTONE) 25 MG tablet Take 1 tablet (25 mg total) by mouth daily. 90 tablet 3   No current facility-administered medications on file prior to visit.    Allergies  Allergen Reactions  . Cephalexin Hives and Rash    Denies Airway involvement  . Latex Rash  . Neomycin-Bacitracin Zn-Polymyx Rash  . Tape Rash    Latex Prefers paper tape    Family History  Problem Relation Age of Onset  . Hyperlipidemia Father   . Hypertension Father   . Cirrhosis Father   . Colon cancer Father 71       d. 21  . Colon cancer Sister 49       d. 71  . Bipolar disorder Sister   . Aneurysm Mother        brain  . Cerebral aneurysm Mother   . Colon cancer Sister 63  d. 65  . Bipolar disorder Sister   . Cancer Paternal Aunt        NOS, ? colon  . Congestive Heart Failure Maternal Grandmother   . Drug abuse Neg Hx   . CAD Neg Hx   . Stomach cancer Neg Hx   . Hyperparathyroidism Neg  Hx   . Hypercalcemia Neg Hx     BP (!) 182/92   Pulse 87   Ht 5' 7.5" (1.715 m)   Wt 240 lb (108.9 kg)   SpO2 97%   BMI 37.03 kg/m    Review of Systems Denies polyuria, and depression.  She lost 140 lbs after surgery, but she has regained a few.  She has chronic arthralgias.     Objective:   Physical Exam VITAL SIGNS:  See vs page GENERAL: no distress NECK: Thyroid is slightly and diffusely enlarged.  No thyroid nodule is palpable.  No palpable lymphadenopathy at the anterior neck.   SPINE: no kyphosis GAIT: slow but steady (chronic knee pain).     Ca++=10.9 PTH=219  Lab Results  Component Value Date   IRON 37 02/10/2020   TIBC 323 02/10/2020   FERRITIN 102 02/10/2020    Lab Results  Component Value Date   CREATININE 1.04 (H) 04/03/2020   BUN 20 04/03/2020   NA 141 04/03/2020   K 4.8 04/03/2020   CL 104 04/03/2020   CO2 25 04/03/2020    I have reviewed outside records, and summarized: Pt was noted to have elevated A1c, and referred here.  She has been admitted several times for joint replacement      Assessment & Plan:  Hypercalcemia, prob due to HCTZ HTN: we can't reduce HCTZ for now.   Hyperparathyroidism, poss secondary.   Patient Instructions  Blood tests are requested for you today.  We'll let you know about the results.  I have sent a prescription to your pharmacy, for an additional fluid pill We will need to take this complex situation in stages. Please come back for a follow-up appointment in 2-3 weeks.

## 2020-05-15 ENCOUNTER — Other Ambulatory Visit: Payer: Medicare HMO

## 2020-05-15 ENCOUNTER — Encounter: Payer: Medicare HMO | Admitting: Cardiology

## 2020-05-15 ENCOUNTER — Encounter: Payer: Medicare HMO | Admitting: Internal Medicine

## 2020-05-15 NOTE — Progress Notes (Signed)
Remote pacemaker transmission.   

## 2020-05-16 ENCOUNTER — Ambulatory Visit: Payer: Medicare HMO

## 2020-05-16 ENCOUNTER — Ambulatory Visit: Payer: Medicare HMO | Admitting: Oncology

## 2020-05-23 ENCOUNTER — Ambulatory Visit: Payer: Medicare HMO | Admitting: Audiologist

## 2020-05-29 ENCOUNTER — Ambulatory Visit: Payer: Self-pay

## 2020-05-29 ENCOUNTER — Encounter: Payer: Self-pay | Admitting: Physical Medicine and Rehabilitation

## 2020-05-29 ENCOUNTER — Other Ambulatory Visit: Payer: Self-pay

## 2020-05-29 ENCOUNTER — Ambulatory Visit (INDEPENDENT_AMBULATORY_CARE_PROVIDER_SITE_OTHER): Payer: Medicare HMO | Admitting: Physical Medicine and Rehabilitation

## 2020-05-29 VITALS — BP 182/86 | HR 60

## 2020-05-29 DIAGNOSIS — M5416 Radiculopathy, lumbar region: Secondary | ICD-10-CM | POA: Diagnosis not present

## 2020-05-29 MED ORDER — DEXAMETHASONE SODIUM PHOSPHATE 10 MG/ML IJ SOLN
15.0000 mg | Freq: Once | INTRAMUSCULAR | Status: AC
  Administered 2020-05-29: 15 mg

## 2020-05-29 NOTE — Progress Notes (Signed)
Pt state lower back pain. Pt state walking and standing makes the pain worse.Pt state trying to get out of bed makes the pai worse. Pt state pain meds and sitting down with a pillow help ease the pain.  Numeric Pain Rating Scale and Functional Assessment Average Pain 4   In the last MONTH (on 0-10 scale) has pain interfered with the following?  1. General activity like being  able to carry out your everyday physical activities such as walking, climbing stairs, carrying groceries, or moving a chair?  Rating(6)   +Driver, +BT, -Dye Allergies.

## 2020-05-30 ENCOUNTER — Encounter: Payer: Self-pay | Admitting: Endocrinology

## 2020-05-30 ENCOUNTER — Ambulatory Visit (INDEPENDENT_AMBULATORY_CARE_PROVIDER_SITE_OTHER): Payer: Medicare HMO | Admitting: Endocrinology

## 2020-05-30 VITALS — BP 170/100 | HR 55 | Ht 67.5 in | Wt 239.0 lb

## 2020-05-30 DIAGNOSIS — E213 Hyperparathyroidism, unspecified: Secondary | ICD-10-CM | POA: Diagnosis not present

## 2020-05-30 MED ORDER — VITAMIN D 125 MCG (5000 UT) PO CAPS: 5000.0000 [IU] | ORAL_CAPSULE | Freq: Every day | ORAL | 11 refills | Status: DC

## 2020-05-30 NOTE — Patient Instructions (Addendum)
Please start taking Vitamin-D, 5000 units per day.   We cannot reduce the HCTZ now, because of your blood pressure.   We will need to take this complex situation in stages.  Please see cardiology, to get your blood pressure better.  If possible, furosemide is preferred over HCTZ.  Weight loss is the best treatment for the blood pressure.  Please come back for a follow-up appointment in 6 weeks.

## 2020-05-30 NOTE — Progress Notes (Signed)
Subjective:    Patient ID: [name], female    DOB: 11-26-1955, 64 y.o.   MRN: [id number]  HPI Pt returns for f/u of hypercalcemia (dx'ed 2019; prob caused or contributed to by HCTZ, but she can't stop this, due to HTN; she has never had osteoporosis, urolithiasis, or bony fracture; she had gastric bypass in 2015; hyperparathyroidism may be secondary).  She does not take Vit-D.  She takes BP meds as rx'ed.  She had pacemaker placed a few mos ago.  Past Medical History:  Diagnosis Date  . Allergy   . Anemia   . ANEMIA, PERNICIOUS, HX OF 05/13/2007  . Anxiety   . Arthritis   . ASYMPTOMATIC POSTMENOPAUSAL STATUS 02/18/2008  . BACK PAIN, LUMBAR 11/19/2007  . Blood in urine 07/19/2019  . Bowel incontinence   . Cataract   . Cellulitis of left lower leg 10/28/2013  . Cellulitis of leg, right 10/11/2010  . Chronic dermatitis of hands 05/02/2009  . DEPRESSION, CHRONIC 10/06/2007  . Diabetes mellitus without complication ([place])   . DIABETES MELLITUS, WITH NEUROLOGICAL COMPLICATIONS 09/30/8117  . Diabetic neuropathy ([place])   . DYSLIPIDEMIA 05/13/2007  . Essential hypertension 03/20/2010  . Family history of colon cancer   . Fecal incontinence 08/02/2015  . Frozen shoulder    Lt  . Full dentures   . Genetic testing 11/24/2017   Negative genetic testing on the common hereditary cancer panel.  The Hereditary Gene Panel offered by [facility] includes sequencing and/or deletion duplication testing of the following 47 genes: APC, ATM, AXIN2, BARD1, BMPR1A, BRCA1, BRCA2, BRIP1, CDH1, CDK4, CDKN2A (p14ARF), CDKN2A (p16INK4a), CHEK2, CTNNA1, DICER1, EPCAM (Deletion/duplication testing only), GREM1 (promoter region deletion/duplicat  . GERD (gastroesophageal reflux disease)   . GI bleed 03/15/2016  . GOITER, MULTINODULAR 05/13/2007  . Headache   . History of blood transfusion   . Hx of colonic polyps 12/04/2010  . HYPERCHOLESTEROLEMIA 03/20/2010  . HYPERTENSION 03/20/2010  . Lower back pain   . NASH  (nonalcoholic steatohepatitis)   . OA (osteoarthritis)   . OBSTRUCTIVE SLEEP APNEA 06/12/2007   uses CPAP.  Marland Kitchen PERIPHERAL NEUROPATHY 01/12/2009  . Peripheral vascular disease ([place])   . Primary osteoarthritis of left knee 04/23/2011   Previously seen by Dr. Alphonzo Severance. Will plan on repeat referral after trial injection.     Marland Kitchen PSORIASIS 05/28/2010  . Rectal prolapse 05/24/2015  . Rectovaginal fistula 05/25/2014  . Renal insufficiency    stage 1 kd  . Spigelian hernia 02/05/2018  . Stress 05/24/2015  . Surgical counseling visit 10/02/2019  . Umbilical hernia without obstruction and without gangrene 04/15/2017  . Unilateral primary osteoarthritis, left knee   . UNSPECIFIED VENOUS INSUFFICIENCY 05/28/2010   Patient with frequent ulceration related to venous stasis-seen at wound care. Edema and Venous stasis changes due to this . Echo 04/16/11 with EF 60%. No signs diastolic dysfunction.       . Vaginal irritation 07/19/2019  . Wears glasses     Past Surgical History:  Procedure Laterality Date  . CATARACT EXTRACTION W/ INTRAOCULAR LENS  IMPLANT, BILATERAL    . COLONOSCOPY    . ELECTROCARDIOGRAM  04/16/2006  . EXAMINATION UNDER ANESTHESIA N/A 06/29/2014   Procedure: EXAM UNDER ANESTHESIA;  Surgeon: Janyth Contes, MD;  Location: [facility];  Service: Gynecology;  Laterality: N/A;  . Exercise myoview  01/24/2005  . FLEXIBLE SIGMOIDOSCOPY    . GASTRIC BYPASS  11/09/2013  . GASTRIC BYPASS    . JOINT REPLACEMENT    .  KNEE ARTHROSCOPY  2003   right  . LAPAROSCOPY N/A 03/12/2016   Procedure: LAPAROSCOPIC ANASTOMOSIS OF INTESTINE (ENTEROENTEROSTOMY);  Surgeon: Ladora Daniel, MD;  Location: [facility];  Service: General;  Laterality: N/A;  . LEG SURGERY Left    metal and pins in lower left leg  . mrsa Right    arm  . MULTIPLE TOOTH EXTRACTIONS    . PACEMAKER LEADLESS INSERTION N/A 02/08/2020   Procedure: PACEMAKER LEADLESS INSERTION;  Surgeon: Thompson Grayer, MD;  Location: [facility];   Service: Cardiovascular;  Laterality: N/A;  . SHOULDER ARTHROSCOPY W/ ROTATOR CUFF REPAIR Right   . stab phlebectomy  Right 02/10/2019   stab phlebectomy > 20 incisions right leg by [name] MD   . TONSILLECTOMY    . TOTAL KNEE ARTHROPLASTY Left 06/30/2018   Procedure: LEFT TOTAL KNEE ARTHROPLASTY;  Surgeon: Meredith Pel, MD;  Location: [facility];  Service: Orthopedics;  Laterality: Left;  . TOTAL KNEE ARTHROPLASTY Right 11/16/2019  . TOTAL KNEE ARTHROPLASTY Right 11/16/2019   Procedure: RIGHT TOTAL KNEE ARTHROPLASTY-CEMENTED;  Surgeon: Meredith Pel, MD;  Location: [facility];  Service: Orthopedics;  Laterality: Right;  Marland Kitchen VARICOSE VEIN SURGERY     Remotef    Social History   Socioeconomic History  . Marital status: Married    Spouse name: Marya Amsler  . Number of children: 2  . Years of education: [employer] G[employer]  . Highest education level: Not on file  Occupational History  . Occupation: Unemployed    Comment: disabled  Tobacco Use  . Smoking status: Former Smoker    Packs/day: 2.00    Years: 24.00    Pack years: 48.00    Types: Cigarettes    Start date: 06/24/1970    Quit date: 08/06/1994    Years since quitting: 25.8  . Smokeless tobacco: Never Used  Vaping Use  . Vaping Use: Never used  Substance and Sexual Activity  . Alcohol use: No    Alcohol/week: 0.0 standard drinks  . Drug use: No  . Sexual activity: Not Currently  Other Topics Concern  . Not on file  Social History Narrative   Married '91, husband has MS, wheelchair bound   [id number] x 2, 1 son '91 8.5lbs, 1 daughter '92 10lbs      Caffeine use- coffee 3-4 cups daily   Social Determinants of Health   Financial Resource Strain: Not on file  Food Insecurity: Not on file  Transportation Needs: Not on file  Physical Activity: Not on file  Stress: Not on file  Social Connections: Not on file  Intimate Partner Violence: Not on file    Current Outpatient Medications on File Prior to Visit  Medication Sig  Dispense Refill  . acetaminophen (TYLENOL) 325 MG tablet Take 2 tablets (650 mg total) by mouth every 4 (four) hours as needed for headache or mild pain.    Marland Kitchen amLODipine (NORVASC) 10 MG tablet TAKE 1 TABLET BY MOUTH EVERY DAY (Patient taking differently: Take 10 mg by mouth daily. ) 90 tablet 0  . apixaban (ELIQUIS) 5 MG TABS tablet Take 1 tablet (5 mg total) by mouth 2 (two) times daily. Resume with EVENING dose today. 60 tablet 0  . atorvastatin (LIPITOR) 40 MG tablet TAKE 1 TABLET BY MOUTH EVERY DAY (Patient taking differently: Take 40 mg by mouth daily. ) 90 tablet 0  . celecoxib (CELEBREX) 200 MG capsule Take 1 capsule (200 mg total) by mouth 2 (two) times daily. 60 capsule 0  .  ferrous sulfate 324 (65 Fe) MG TBEC TAKE 1 TABLET BY MOUTH EVERY OTHER DAY 180 tablet 1  . furosemide (LASIX) 20 MG tablet Take 1 tablet (20 mg total) by mouth daily. 90 tablet 3  . gabapentin (NEURONTIN) 100 MG capsule Take 100 mg by mouth every morning.    . gabapentin (NEURONTIN) 300 MG capsule TAKE 1 CAPSULE BY MOUTH THREE TIMES A DAY 270 capsule 3  . glucose blood (ACCU-CHEK AVIVA PLUS) test strip Use to check sugar once daily in the morning 100 each 0  . lisinopril-hydrochlorothiazide (ZESTORETIC) 20-25 MG tablet Take 1 tablet by mouth daily. 90 tablet 3  . metFORMIN (GLUCOPHAGE) 1000 MG tablet Take 1 tablet (1,000 mg total) by mouth daily with breakfast. 90 tablet 2  . methocarbamol (ROBAXIN) 500 MG tablet Take 1 tablet (500 mg total) by mouth every 8 (eight) hours as needed for muscle spasms. 30 tablet 0  . methocarbamol (ROBAXIN) 500 MG tablet TAKE 1 TABLET BY MOUTH EVERY 6 HOURS AS NEEDED FOR MUSCLE SPASM (Patient taking differently: Take 500 mg by mouth every 6 (six) hours as needed for muscle spasms. ) 120 tablet 3  . multivitamin-iron-minerals-folic acid (CENTRUM) chewable tablet Chew 1 tablet by mouth daily.     . NON FORMULARY Diet type:  NAS    . oxyCODONE (OXY IR/ROXICODONE) 5 MG immediate release  tablet Take 1 tablet (5 mg total) by mouth every 4 (four) hours as needed for severe pain. 35 tablet 0  . sertraline (ZOLOFT) 100 MG tablet Take 1 tablet (100 mg total) by mouth daily. 90 tablet 1  . Teduglutide, rDNA, (GATTEX) 5 MG KIT Inject 5 mg into the skin daily.     Marland Kitchen spironolactone (ALDACTONE) 25 MG tablet Take 1 tablet (25 mg total) by mouth daily. 90 tablet 3   Current Facility-Administered Medications on File Prior to Visit  Medication Dose Route Frequency Provider Last Rate Last Admin  . dexamethasone (DECADRON) injection 15 mg  15 mg Other Once Magnus Sinning, MD        Allergies  Allergen Reactions  . Cephalexin Hives and Rash    Denies Airway involvement  . Latex Rash  . Neomycin-Bacitracin Zn-Polymyx Rash  . Tape Rash    Latex Prefers paper tape    Family History  Problem Relation Age of Onset  . Hyperlipidemia Father   . Hypertension Father   . Cirrhosis Father   . Colon cancer Father 89       d. 17  . Colon cancer Sister 39       d. 23  . Bipolar disorder Sister   . Aneurysm Mother        brain  . Cerebral aneurysm Mother   . Colon cancer Sister 56       d. 57  . Bipolar disorder Sister   . Cancer Paternal Aunt        NOS, ? colon  . Congestive Heart Failure Maternal Grandmother   . Drug abuse Neg Hx   . CAD Neg Hx   . Stomach cancer Neg Hx   . Hyperparathyroidism Neg Hx   . Hypercalcemia Neg Hx     BP (!) 170/100   Pulse (!) 55   Ht 5' 7.5" (1.715 m)   Wt 239 lb (108.4 kg)   SpO2 97%   BMI 36.88 kg/m    Review of Systems Denies sob    Objective:   Physical Exam VITAL SIGNS:  See vs page GENERAL: no  distress EXT: 1+ bilat leg edema, and vv's  Lab Results  Component Value Date   PTH 196 (H) 05/07/2017   CALCIUM 11.1 (H) 04/03/2020   CAION 1.15 04/29/2013   PHOS 3.2 06/30/2013    25-OH vit-D=21    Assessment & Plan:  Vit-D def: uncontrolled HTN: uncontrolled Hyperparathyroidism: may be both primary and  secondary.  Patient Instructions  Please start taking Vitamin-D, 5000 units per day.   We cannot reduce the HCTZ now, because of your blood pressure.   We will need to take this complex situation in stages.  Please see cardiology, to get your blood pressure better.  If possible, furosemide is preferred over HCTZ.  Weight loss is the best treatment for the blood pressure.  Please come back for a follow-up appointment in 6 weeks.

## 2020-06-07 ENCOUNTER — Ambulatory Visit: Payer: Medicare HMO | Attending: Family Medicine | Admitting: Audiologist

## 2020-06-07 ENCOUNTER — Other Ambulatory Visit: Payer: Self-pay

## 2020-06-07 DIAGNOSIS — H903 Sensorineural hearing loss, bilateral: Secondary | ICD-10-CM | POA: Insufficient documentation

## 2020-06-07 NOTE — Procedures (Signed)
  [facility] [place], [place]  [id number] [phone]  AUDIOLOGICAL  EVALUATION  NAME: HORTENSE CANTRALL     DOB:   09-22-1955      MRN: [id number]                                                                                     DATE: 06/07/2020     REFERENT: Daisy Floro, DO STATUS: Outpatient DIAGNOSIS: Sensorineural hearing loss, bilateral  History: Luzmaria was seen for an audiological evaluation. Cyla is receiving a hearing evaluation due to concerns for hearing loss causing difficulty communicating. Nautica has difficulty hearing in background noise, crowds, and when people are at a distance. This difficulty began gradually. Shaunika's husband has MS and often has to call for her from a different room. She wants to be able to hear him in case he needs help. No pain or pressure reported in either ear. No tinnitus present in either ear.  Medical history positive for type II diabetes with neuropathy which is a risk factor for hearing loss. No other relevant case history reported.   Evaluation:   Otoscopy showed a clear view of the tympanic membranes, bilaterally  Tympanometry results were consistent with normal middle ear function, bilaterally    Audiometric testing was completed using conventional audiometry with insert transducer. Speech Recognition Thresholds were consistent with pure tone averages. Word Recognition was excellent at an elevated level. Pure tone thresholds show normal sloping to a moderate high frequency sensorineural hearing loss in both ears. Test results are consistent with presbycusis.   Results:  The test results were reviewed with Tammee. She was counseled on the nature and degree of her hearing loss. She was provided with several copies of her audiogram that illustrate her degree of hearing loss in both ears. Her hearing loss is in the high frequencies preventing her from hearing high frequency consonants such  as /s/ /sh/ /f//t/and /th/. These sounds help differentiate the words she hears. Without these sounds speechismuffled and unclear unless someone is face to face within 5 feet. Arnetha is an excellent hearing aid candidate at this time. With hearing aids the clarity of speech will improve and Shakeya will not have to work so hard to hear. There are also 'remote microphones' which could be given to Madiha's husband. He could talk into it as needed and his voice would stream directly into her hearing aids. This would allow her to hear him from a different room. Yoshiye reported understanding what was discussed today and will call to schedule a hearing aid consult with a provider on the list. She asked about Bose hearing aids. She was advised that as long as they can be programmed to her hearing loss, and have a trial period during which she can return them, they could also be a good option.   Recommendations: 1. Amplification is necessary for both ears. Hearing aids can be purchased from a variety of locations. See provided list for locations in the [place] area.    Alfonse Alpers  Audiologist, Au.D., CCC-A 06/07/2020  4:42 PM  Cc: Daisy Floro, DO

## 2020-07-11 ENCOUNTER — Ambulatory Visit: Payer: Medicare HMO | Admitting: Endocrinology

## 2020-07-28 ENCOUNTER — Other Ambulatory Visit: Payer: Self-pay | Admitting: Family Medicine

## 2020-07-28 DIAGNOSIS — Z1231 Encounter for screening mammogram for malignant neoplasm of breast: Secondary | ICD-10-CM

## 2020-07-31 ENCOUNTER — Other Ambulatory Visit: Payer: Self-pay

## 2020-07-31 ENCOUNTER — Ambulatory Visit
Admission: RE | Admit: 2020-07-31 | Discharge: 2020-07-31 | Disposition: A | Discharge status: Home / Self Care | Payer: Medicare Other | Source: Ambulatory Visit | Attending: Family Medicine | Admitting: Family Medicine

## 2020-07-31 DIAGNOSIS — Z1231 Encounter for screening mammogram for malignant neoplasm of breast: Secondary | ICD-10-CM

## 2020-07-31 NOTE — Progress Notes (Signed)
[name] - 65 y.o. female MRN [id number]  Date of birth: 1955-09-05  Office Visit Note: Visit Date: 05/29/2020 PCP: Daisy Floro, DO Referred by: Daisy Floro, DO  Subjective: Chief Complaint  Patient presents with  . Lower Back - Pain   HPI:  [name] is a 65 y.o. female who comes in today at the request of Dr. Anderson Malta for planned Right L3-L4 Lumbar epidural steroid injection with fluoroscopic guidance.  The patient has failed conservative care including home exercise, medications, time and activity modification.  This injection will be diagnostic and hopefully therapeutic.  Please see requesting physician notes for further details and justification.  MRI reviewed with images and spine model.  MRI reviewed in the note below.    ROS Otherwise per HPI.  Assessment & Plan: Visit Diagnoses:    ICD-10-CM   1. Lumbar radiculopathy  M54.16 XR C-ARM NO REPORT    Epidural Steroid injection    dexamethasone (DECADRON) injection 15 mg    Plan: No additional findings.   Meds & Orders:  Meds ordered this encounter  Medications  . dexamethasone (DECADRON) injection 15 mg    Orders Placed This Encounter  Procedures  . XR C-ARM NO REPORT  . Epidural Steroid injection    Follow-up: Return if symptoms worsen or fail to improve.   Procedures: No procedures performed  Lumbosacral Transforaminal Epidural Steroid Injection - Sub-Pedicular Approach with Fluoroscopic Guidance  Patient: [name]      Date of Birth: 1956/01/19 MRN: [id number] PCP: Daisy Floro, DO      Visit Date: 05/29/2020   Universal Protocol:    Date/Time: 05/29/2020  Consent Given By: the patient  Position: PRONE  Additional Comments: Vital signs were monitored before and after the procedure. Patient was prepped and draped in the usual sterile fashion. The correct patient, procedure, and site was verified.   Injection Procedure Details:   Procedure diagnoses: Lumbar  radiculopathy [M54.16]    Meds Administered:  Meds ordered this encounter  Medications  . dexamethasone (DECADRON) injection 15 mg    Laterality: Right  Location/Site:  L3-L4  Needle:5.0 in., 22 ga.  Short bevel or Quincke spinal needle  Needle Placement: Transforaminal  Findings:    -Comments: Excellent flow of contrast along the nerve, nerve root and into the epidural space.  Procedure Details: After squaring off the end-plates to get a true AP view, the C-arm was positioned so that an oblique view of the foramen as noted above was visualized. The target area is just inferior to the "nose of the scotty dog" or sub pedicular. The soft tissues overlying this structure were infiltrated with 2-3 ml. of 1% Lidocaine without Epinephrine.  The spinal needle was inserted toward the target using a "trajectory" view along the fluoroscope beam.  Under AP and lateral visualization, the needle was advanced so it did not puncture dura and was located close the 6 O'Clock position of the pedical in AP tracterory. Biplanar projections were used to confirm position. Aspiration was confirmed to be negative for CSF and/or blood. A 1-2 ml. volume of Isovue-250 was injected and flow of contrast was noted at each level. Radiographs were obtained for documentation purposes.   After attaining the desired flow of contrast documented above, a 0.5 to 1.0 ml test dose of 0.25% Marcaine was injected into each respective transforaminal space.  The patient was observed for 90 seconds post injection.  After no sensory deficits were reported, and  normal lower extremity motor function was noted,   the above injectate was administered so that equal amounts of the injectate were placed at each foramen (level) into the transforaminal epidural space.   Additional Comments:  The patient tolerated the procedure well Dressing: 2 x 2 sterile gauze and Band-Aid    Post-procedure details: Patient was observed during the  procedure. Post-procedure instructions were reviewed.  Patient left the clinic in stable condition.      Clinical History: CT LUMBAR MYELOGRAM FINDINGS:  Mild curvature convex to the right in the upper lumbar region into the left in the lower lumbar region. No antero or retrolisthesis in the supine position. The patient is noted to have retrolisthesis at L3-4 on the myelogram images.  T12-L1: Normal  L1-2: Mild noncompressive disc bulge.  L2-3: Disc degeneration with loss of disc height and vacuum phenomenon. Endplate osteophytes and bulging of the disc. Mild stenosis of both lateral recesses with some potential to affect the L3 nerves.  L3-4: Circumferential protrusion of the disc. Facet and ligamentous hypertrophy. Multifactorial spinal stenosis that could cause neural compression on either or both sides. The appearance would worsen when retrolisthesis occurs as noted above.  L4-5: Bulging of the disc. Facet and ligamentous hypertrophy. Mild to moderate stenosis of both lateral recesses with some potential for neural compression.  L5-S1: Normal appearance of the disc. Ordinary mild facet osteoarthritis. No stenosis.  Mild bilateral sacroiliac osteoarthritis is noted.  IMPRESSION: 1. Mild curvature convex to the right in the upper lumbar region into the left in the lower lumbar region. No antero or retrolisthesis in the supine position. The patient is noted to have retrolisthesis at L3-4 of 3 mm with flexion which increases to 6 mm with neutral and extension. 2. Multifactorial spinal stenosis at L3-4 due to circumferential protrusion of the disc. Facet and ligamentous hypertrophy. Stenosis at this level could cause neural compression. 3. L4-5: Mild to moderate stenosis of both lateral recesses with some potential for neural compression. 4. L2-3: Disc degeneration with loss of disc height and vacuum phenomenon. Endplate osteophytes and bulging of the disc.  Mild stenosis of both lateral recesses with some potential to affect the L3 nerves. 5. L5-S1: Ordinary mild facet osteoarthritis. No stenosis. 6. Mild bilateral sacroiliac osteoarthritis.   Electronically Signed   By: Nelson Chimes M.D.   On: 04/17/2020 14:13     Objective:  VS:  HT:    WT:   BMI:     BP:(!) 182/86  HR:60bpm  TEMP: ( )  RESP:  Physical Exam Vitals and nursing note reviewed.  Constitutional:      General: She is not in acute distress.    Appearance: Normal appearance. She is obese. She is not ill-appearing.  HENT:     Head: Normocephalic and atraumatic.     Right Ear: External ear normal.     Left Ear: External ear normal.  Eyes:     Extraocular Movements: Extraocular movements intact.  Cardiovascular:     Rate and Rhythm: Normal rate.     Pulses: Normal pulses.  Pulmonary:     Effort: Pulmonary effort is normal. No respiratory distress.  Abdominal:     General: There is no distension.     Palpations: Abdomen is soft.  Musculoskeletal:        General: Tenderness present.     Cervical back: Neck supple.     Right lower leg: No edema.     Left lower leg: No edema.  Comments: Patient has good distal strength with no pain over the greater trochanters.  No clonus or focal weakness.  Skin:    Findings: No erythema, lesion or rash.  Neurological:     General: No focal deficit present.     Mental Status: She is alert and oriented to person, place, and time.     Sensory: No sensory deficit.     Motor: No weakness or abnormal muscle tone.     Coordination: Coordination normal.  Psychiatric:        Mood and Affect: Mood normal.        Behavior: Behavior normal.      Imaging: No results found.

## 2020-07-31 NOTE — Procedures (Signed)
Lumbosacral Transforaminal Epidural Steroid Injection - Sub-Pedicular Approach with Fluoroscopic Guidance  Patient: [name]      Date of Birth: 06/29/1955 MRN: [id number] PCP: Daisy Floro, DO      Visit Date: 05/29/2020   Universal Protocol:    Date/Time: 05/29/2020  Consent Given By: the patient  Position: PRONE  Additional Comments: Vital signs were monitored before and after the procedure. Patient was prepped and draped in the usual sterile fashion. The correct patient, procedure, and site was verified.   Injection Procedure Details:   Procedure diagnoses: Lumbar radiculopathy [M54.16]    Meds Administered:  Meds ordered this encounter  Medications  . dexamethasone (DECADRON) injection 15 mg    Laterality: Right  Location/Site:  L3-L4  Needle:5.0 in., 22 ga.  Short bevel or Quincke spinal needle  Needle Placement: Transforaminal  Findings:    -Comments: Excellent flow of contrast along the nerve, nerve root and into the epidural space.  Procedure Details: After squaring off the end-plates to get a true AP view, the C-arm was positioned so that an oblique view of the foramen as noted above was visualized. The target area is just inferior to the "nose of the scotty dog" or sub pedicular. The soft tissues overlying this structure were infiltrated with 2-3 ml. of 1% Lidocaine without Epinephrine.  The spinal needle was inserted toward the target using a "trajectory" view along the fluoroscope beam.  Under AP and lateral visualization, the needle was advanced so it did not puncture dura and was located close the 6 O'Clock position of the pedical in AP tracterory. Biplanar projections were used to confirm position. Aspiration was confirmed to be negative for CSF and/or blood. A 1-2 ml. volume of Isovue-250 was injected and flow of contrast was noted at each level. Radiographs were obtained for documentation purposes.   After attaining the desired flow of  contrast documented above, a 0.5 to 1.0 ml test dose of 0.25% Marcaine was injected into each respective transforaminal space.  The patient was observed for 90 seconds post injection.  After no sensory deficits were reported, and normal lower extremity motor function was noted,   the above injectate was administered so that equal amounts of the injectate were placed at each foramen (level) into the transforaminal epidural space.   Additional Comments:  The patient tolerated the procedure well Dressing: 2 x 2 sterile gauze and Band-Aid    Post-procedure details: Patient was observed during the procedure. Post-procedure instructions were reviewed.  Patient left the clinic in stable condition.

## 2020-08-01 ENCOUNTER — Encounter: Payer: Self-pay | Admitting: Family Medicine

## 2020-08-01 ENCOUNTER — Ambulatory Visit (INDEPENDENT_AMBULATORY_CARE_PROVIDER_SITE_OTHER): Payer: Medicare Other | Admitting: Family Medicine

## 2020-08-01 VITALS — BP 150/70 | HR 66

## 2020-08-01 DIAGNOSIS — E1140 Type 2 diabetes mellitus with diabetic neuropathy, unspecified: Secondary | ICD-10-CM

## 2020-08-01 DIAGNOSIS — R413 Other amnesia: Secondary | ICD-10-CM | POA: Diagnosis not present

## 2020-08-01 LAB — POCT GLYCOSYLATED HEMOGLOBIN (HGB A1C): Hemoglobin A1C: 7.9 % — AB (ref 4.0–5.6)

## 2020-08-01 NOTE — Patient Instructions (Addendum)
Thank you for coming in to see [place] today! Please see below to review our plan for today's visit:  1. I recommend a primarily vegetarian diet to decrease sugar intake. Avoid carbohydrates: breads, potatoes, rice, pastas, noodles, sweets, juices, and sodas.  2. I'm going to reach out regarding support for healthcare at home as well as memory support.   Please call the clinic at [phone] if your symptoms worsen or you have any concerns. It was our pleasure to serve you!   Dr. Milus Banister [facility]   Vegetarian Eating Information Many people may prefer vegetarian eating for religious, environmental, or personal reasons. These diets are often lower in calories, salt, sugar, cholesterol, and saturated and trans fats. Vegetarian eating provides significant health benefits. People who eat a vegetarian diet often have lower rates of:  Obesity.  Diabetes.  Breast and colon cancers.  Cardiovascular and gallbladder diseases. What are the types of vegetarian eating? Vegetarian eating includes dietary choices that focus on eating mostly vegetables and fruit, grains, beans, nuts, and seeds. There are several different types of vegetarian eating. Talk with a diet and nutrition specialist (dietitian) about what type of vegetarian diet is best for you. Lacto-ovo vegetarian  Recommended foods: fruits and vegetables, milk and dairy, eggs, grains, soy and vegetable protein, beans, nuts, and seeds.  Foods to avoid: meat, poultry, seafood, animal-based broths and gravies, and gelatin. Lacto-vegetarian  Recommended foods: fruits and vegetables, milk and dairy, grains, soy and vegetable protein, beans, nuts, and seeds.  Foods to avoid: meat, poultry, seafood, animal-based broths and gravies, gelatin, and eggs. Vegan  Recommended foods: fruits and vegetables, grains, soy and vegetable protein, beans, nuts, and seeds.  Foods to avoid: meat, poultry, seafood, animal-based broths  and gravies, gelatin, eggs, milk and dairy, and honey.   What do I need to know about vegetarian eating? All vegetarian diets restrict proteins that come from animals. Foods that come from animals have important nutrients, such as protein, fats, vitamins, and minerals. It is important to get these nutrients from other types of foods. If you think you may not be getting the right nutrients, or if you do not eat any animal products, talk with your health care provider or dietitian about taking supplements. A dietitian can help determine your individual nutrient needs. What are tips for following this plan? Eat a diet that includes a variety of fruits, vegetables, whole grains, and protein sources. This is important to make sure you get enough of the following nutrients: Protein Healthy protein sources include:  Eggs, milk, and cheese. Soy products. Tofu, tempeh, and textured vegetable protein (TVP). Quinoa. Hemp seeds. Other protein sources include:  Beans, such as black beans or kidney beans. Other legumes, such as lentils and split peas. Nuts, such as almonds, [place] nuts, and pecans. Seeds, such as sunflower seeds. To get the most benefit from plant-based proteins, combine two or more sources of plant protein with whole grains in one dish. Examples include beans and rice, almond butter on bread, or sunflower seeds on noodles. Vitamin B12 Sources of vitamin B12 include:  Cheese and eggs. Breakfast cereals and other prepared products that have vitamin B12 added (fortified products). If you eat a vegan diet, ask your health care provider or dietitian about taking a B12 supplement. Vitamin D Good sources of vitamin D include:  Egg yolks. Fortified dairy products. Fortified orange juice. Mushrooms. Cereals with added vitamin D. Another way of getting vitamin D is to spend 10 minutes  each day in the sun. This helps your body make its own vitamin D. Depending on your age, you may need to take a  vitamin D supplement. Talk with your health care provider or dietitian about how much vitamin D you need in a supplement. Iron Healthy sources of iron include:  Dark, leafy greens. Nuts. Beans. Grain products that are fortified with iron, such as cereals. Tofu, tempeh, soybeans, and quinoa. To get the most iron from plant-based foods:  Eat iron-containing plant-based foods with vitamin C. For example, squeeze fresh lemon juice over cooked greens like kale, chard, or spinach, or have a glass of orange juice with your meals.  Avoid eating dairy products, coffee, or tea with iron-containing foods. Omega-3 fatty acids Good sources of omega-3 fatty acids include:  Walnuts. Flax seeds, canola oil, soybean oil, and tofu. Avocados. Olives and olive oil. Foods with added omega-3 fatty acids, such as eggs, milk, and juices. Calcium Good sources of calcium include:  Dairy products. Fortified non-dairy milk. Fortified tofu. Dark, leafy greens, such as kale, bok choy, Chinese cabbage, collard greens and mustard greens. Broccoli. Okra. Fortified breakfast cereals and fruit juices. Figs. Zinc Good sources of zinc include:  Pumpkin seeds. Legumes, such as chickpeas, kidney beans, and green peas. Wheat germ, whole grains, and fortified cereals. Mushrooms. Spinach and kale. Milk and dairy foods. Dark chocolate. Summary  Vegetarian eating is a choice made by people who prefer vegetarian eating for religious, environmental, or personal reasons. These diets can provide significant health benefits.  There are several types of vegetarian diets, but all restrict proteins that come from animals.  It is important to make sure that you are getting enough nutrients, including protein, vitamin B12, vitamin D, iron, omega-3 fatty acids, calcium, and zinc from your diet.  If you think you are not getting the right nutrients or if you do not eat any animal products, talk with your health care provider or  dietitian. This information is not intended to replace advice given to you by your health care provider. Make sure you discuss any questions you have with your health care provider. Document Revised: 07/30/2016 Document Reviewed: 08/13/2016 Elsevier Patient Education  2021 [employer].

## 2020-08-01 NOTE — Progress Notes (Signed)
    SUBJECTIVE:   CHIEF COMPLAINT / HPI:   T2DM  BMI 37: patient presents today to have her A1c checked. A1c 7.9% today, in October 2021 was 8%. She is currently taking Metformin 1042m daily. She is asking for information regarding healthier eating and dietary changes. Last CMP in Oct 2021 is stable. No concerns for polyuria (sometimes difficult to gauge as patient is on diuretics), polydipsia, or polyphagia. Patient's weight ranges between 235 - 244 lbs since Oct 2021. She may benefit from Ozempic to help with weight loss and better control of diabetes (goal A1c about 7.0%).  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 I told the patient about our Geriatrics clinic and further evaluation of her cognition/memory and she expressed interest. Plan to reach out to Geriatrics Team to provide the patient with further evaluation and more resources.   Help at Home: patient's husband has MS. He is heavily reliant on the patient for care. This is often times very cumbersome for the patient due to the extent of her husband's illness. The patient's husband was actually scheduled to be seen today, however due to lack of transportation available for him in his power wheelchair the patient cancelled his appointment and scheduled this one for herself.   PERTINENT  PMH / PSH: T2DM, HLD, pAFib   OBJECTIVE:   BP (!) 150/70   Pulse 66   SpO2 97%    Physical exam: General: well appearing, nontoxic, seems somewhat stressed today Respiratory: comfortable work of breathing, speaking in complete sentences  ASSESSMENT/PLAN:   Type 2 diabetes, controlled, with neuropathy (HCC) A1c today 7.9%, last time was 8%. Goal 7%. Patient continues on Metformin, was only taking once daily.  -Continue Metformin 10045mBID -Recheck A1c in 3 months -Consider GLP agonist or SGLT2 for better control and cardiac/renal benefits  Memory difficulties Patient  expressed concerns regarding her memory.  Patient interested in being evaluated by geriatrics clinic. -Contact initiated regarding scheduling patient for geriatrics clinic   Help at home Patient requesting resources today for help at home taking care of her husband who has multiple sclerosis and is wheelchair-bound. -Reached out to social work team for resources -We will place orders for referrals, follow-up with patient regarding help needed and resources available    [place]aisy Floro[place]

## 2020-08-02 ENCOUNTER — Other Ambulatory Visit: Payer: Self-pay

## 2020-08-02 ENCOUNTER — Ambulatory Visit (INDEPENDENT_AMBULATORY_CARE_PROVIDER_SITE_OTHER): Payer: Medicare Other | Admitting: Endocrinology

## 2020-08-02 VITALS — BP 190/90 | HR 58 | Ht 68.0 in | Wt 244.6 lb

## 2020-08-02 DIAGNOSIS — E213 Hyperparathyroidism, unspecified: Secondary | ICD-10-CM | POA: Diagnosis not present

## 2020-08-02 MED ORDER — FUROSEMIDE 40 MG PO TABS: 40.0000 mg | ORAL_TABLET | Freq: Every day | ORAL | 3 refills | Status: DC

## 2020-08-02 NOTE — Progress Notes (Signed)
Subjective:    Patient ID: [name], female    DOB: Aug 05, 1955, 65 y.o.   MRN: [id number]  HPI Pt returns for f/u of hypercalcemia (dx'ed 2019; prob caused or contributed to by HCTZ, but she can't stop this, due to HTN; she has never had osteoporosis, urolithiasis, or bony fracture; she had gastric bypass in 2015; hyperparathyroidism may be secondary; she has a pacemaker).  She does not take Vit-D.  She takes BP meds as rx'ed.   Past Medical History:  Diagnosis Date  . Allergy   . Anemia   . ANEMIA, PERNICIOUS, HX OF 05/13/2007  . Anxiety   . Arthritis   . ASYMPTOMATIC POSTMENOPAUSAL STATUS 02/18/2008  . BACK PAIN, LUMBAR 11/19/2007  . Blood in urine 07/19/2019  . Bowel incontinence   . Cataract   . Cellulitis of left lower leg 10/28/2013  . Cellulitis of leg, right 10/11/2010  . Chronic dermatitis of hands 05/02/2009  . DEPRESSION, CHRONIC 10/06/2007  . Diabetes mellitus without complication ([place])   . DIABETES MELLITUS, WITH NEUROLOGICAL COMPLICATIONS 9/56/2130  . Diabetic neuropathy ([place])   . DYSLIPIDEMIA 05/13/2007  . Essential hypertension 03/20/2010  . Family history of colon cancer   . Fecal incontinence 08/02/2015  . Frozen shoulder    Lt  . Full dentures   . Genetic testing 11/24/2017   Negative genetic testing on the common hereditary cancer panel.  The Hereditary Gene Panel offered by [facility] includes sequencing and/or deletion duplication testing of the following 47 genes: APC, ATM, AXIN2, BARD1, BMPR1A, BRCA1, BRCA2, BRIP1, CDH1, CDK4, CDKN2A (p14ARF), CDKN2A (p16INK4a), CHEK2, CTNNA1, DICER1, EPCAM (Deletion/duplication testing only), GREM1 (promoter region deletion/duplicat  . GERD (gastroesophageal reflux disease)   . GI bleed 03/15/2016  . GOITER, MULTINODULAR 05/13/2007  . Headache   . History of blood transfusion   . Hx of colonic polyps 12/04/2010  . HYPERCHOLESTEROLEMIA 03/20/2010  . HYPERTENSION 03/20/2010  . Lower back pain   . NASH (nonalcoholic  steatohepatitis)   . OA (osteoarthritis)   . OBSTRUCTIVE SLEEP APNEA 06/12/2007   uses CPAP.  Marland Kitchen PERIPHERAL NEUROPATHY 01/12/2009  . Peripheral vascular disease ([place])   . Primary osteoarthritis of left knee 04/23/2011   Previously seen by Dr. Alphonzo Severance. Will plan on repeat referral after trial injection.     Marland Kitchen PSORIASIS 05/28/2010  . Rectal prolapse 05/24/2015  . Rectovaginal fistula 05/25/2014  . Renal insufficiency    stage 1 kd  . Spigelian hernia 02/05/2018  . Stress 05/24/2015  . Surgical counseling visit 10/02/2019  . Umbilical hernia without obstruction and without gangrene 04/15/2017  . Unilateral primary osteoarthritis, left knee   . UNSPECIFIED VENOUS INSUFFICIENCY 05/28/2010   Patient with frequent ulceration related to venous stasis-seen at wound care. Edema and Venous stasis changes due to this . Echo 04/16/11 with EF 60%. No signs diastolic dysfunction.       . Vaginal irritation 07/19/2019  . Wears glasses     Past Surgical History:  Procedure Laterality Date  . CATARACT EXTRACTION W/ INTRAOCULAR LENS  IMPLANT, BILATERAL    . COLONOSCOPY    . ELECTROCARDIOGRAM  04/16/2006  . EXAMINATION UNDER ANESTHESIA N/A 06/29/2014   Procedure: EXAM UNDER ANESTHESIA;  Surgeon: Janyth Contes, MD;  Location: [facility];  Service: Gynecology;  Laterality: N/A;  . Exercise myoview  01/24/2005  . FLEXIBLE SIGMOIDOSCOPY    . GASTRIC BYPASS  11/09/2013  . GASTRIC BYPASS    . JOINT REPLACEMENT    . KNEE ARTHROSCOPY  2003  right  . LAPAROSCOPY N/A 03/12/2016   Procedure: LAPAROSCOPIC ANASTOMOSIS OF INTESTINE (ENTEROENTEROSTOMY);  Surgeon: Ladora Daniel, MD;  Location: [facility];  Service: General;  Laterality: N/A;  . LEG SURGERY Left    metal and pins in lower left leg  . mrsa Right    arm  . MULTIPLE TOOTH EXTRACTIONS    . PACEMAKER LEADLESS INSERTION N/A 02/08/2020   Procedure: PACEMAKER LEADLESS INSERTION;  Surgeon: Thompson Grayer, MD;  Location: [facility];  Service:  Cardiovascular;  Laterality: N/A;  . SHOULDER ARTHROSCOPY W/ ROTATOR CUFF REPAIR Right   . stab phlebectomy  Right 02/10/2019   stab phlebectomy > 20 incisions right leg by [name] MD   . TONSILLECTOMY    . TOTAL KNEE ARTHROPLASTY Left 06/30/2018   Procedure: LEFT TOTAL KNEE ARTHROPLASTY;  Surgeon: Meredith Pel, MD;  Location: [facility];  Service: Orthopedics;  Laterality: Left;  . TOTAL KNEE ARTHROPLASTY Right 11/16/2019  . TOTAL KNEE ARTHROPLASTY Right 11/16/2019   Procedure: RIGHT TOTAL KNEE ARTHROPLASTY-CEMENTED;  Surgeon: Meredith Pel, MD;  Location: [facility];  Service: Orthopedics;  Laterality: Right;  Marland Kitchen VARICOSE VEIN SURGERY     Remotef    Social History   Socioeconomic History  . Marital status: Married    Spouse name: Marya Amsler  . Number of children: 2  . Years of education: [employer]  . Highest education level: Not on file  Occupational History  . Occupation: Unemployed    Comment: disabled  Tobacco Use  . Smoking status: Former Smoker    Packs/day: 2.00    Years: 24.00    Pack years: 48.00    Types: Cigarettes    Start date: 06/24/1970    Quit date: 08/06/1994    Years since quitting: 26.0  . Smokeless tobacco: Never Used  Vaping Use  . Vaping Use: Never used  Substance and Sexual Activity  . Alcohol use: No    Alcohol/week: 0.0 standard drinks  . Drug use: No  . Sexual activity: Not Currently  Other Topics Concern  . Not on file  Social History Narrative   Married '91, husband has MS, wheelchair bound   [id number] x 2, 1 son '91 8.5lbs, 1 daughter '92 10lbs      Caffeine use- coffee 3-4 cups daily   Social Determinants of Health   Financial Resource Strain: Not on file  Food Insecurity: Not on file  Transportation Needs: Not on file  Physical Activity: Not on file  Stress: Not on file  Social Connections: Not on file  Intimate Partner Violence: Not on file    Current Outpatient Medications on File Prior to Visit  Medication Sig Dispense  Refill  . acetaminophen (TYLENOL) 325 MG tablet Take 2 tablets (650 mg total) by mouth every 4 (four) hours as needed for headache or mild pain.    Marland Kitchen amLODipine (NORVASC) 10 MG tablet TAKE 1 TABLET BY MOUTH EVERY DAY (Patient taking differently: Take 10 mg by mouth daily.) 90 tablet 0  . apixaban (ELIQUIS) 5 MG TABS tablet Take 1 tablet (5 mg total) by mouth 2 (two) times daily. Resume with EVENING dose today. 60 tablet 0  . atorvastatin (LIPITOR) 40 MG tablet TAKE 1 TABLET BY MOUTH EVERY DAY (Patient taking differently: Take 40 mg by mouth daily.) 90 tablet 0  . celecoxib (CELEBREX) 200 MG capsule Take 1 capsule (200 mg total) by mouth 2 (two) times daily. 60 capsule 0  . Cholecalciferol (VITAMIN D) 125 MCG (5000  UT) CAPS Take 5,000 Units by mouth daily. 30 capsule 11  . ferrous sulfate 324 (65 Fe) MG TBEC TAKE 1 TABLET BY MOUTH EVERY OTHER DAY 180 tablet 1  . gabapentin (NEURONTIN) 100 MG capsule Take 100 mg by mouth every morning.    . gabapentin (NEURONTIN) 300 MG capsule TAKE 1 CAPSULE BY MOUTH THREE TIMES A DAY 270 capsule 3  . glucose blood (ACCU-CHEK AVIVA PLUS) test strip Use to check sugar once daily in the morning 100 each 0  . lisinopril-hydrochlorothiazide (ZESTORETIC) 20-25 MG tablet Take 1 tablet by mouth daily. 90 tablet 3  . metFORMIN (GLUCOPHAGE) 1000 MG tablet Take 1 tablet (1,000 mg total) by mouth daily with breakfast. 90 tablet 2  . methocarbamol (ROBAXIN) 500 MG tablet Take 1 tablet (500 mg total) by mouth every 8 (eight) hours as needed for muscle spasms. 30 tablet 0  . methocarbamol (ROBAXIN) 500 MG tablet TAKE 1 TABLET BY MOUTH EVERY 6 HOURS AS NEEDED FOR MUSCLE SPASM (Patient taking differently: Take 500 mg by mouth every 6 (six) hours as needed for muscle spasms.) 120 tablet 3  . multivitamin-iron-minerals-folic acid (CENTRUM) chewable tablet Chew 1 tablet by mouth daily.     . NON FORMULARY Diet type:  NAS    . oxyCODONE (OXY IR/ROXICODONE) 5 MG immediate release tablet  Take 1 tablet (5 mg total) by mouth every 4 (four) hours as needed for severe pain. 35 tablet 0  . sertraline (ZOLOFT) 100 MG tablet Take 1 tablet (100 mg total) by mouth daily. 90 tablet 1  . Teduglutide, rDNA, 5 MG KIT Inject 5 mg into the skin daily.     Marland Kitchen spironolactone (ALDACTONE) 25 MG tablet Take 1 tablet (25 mg total) by mouth daily. 90 tablet 3   No current facility-administered medications on file prior to visit.    Allergies  Allergen Reactions  . Cephalexin Hives and Rash    Denies Airway involvement  . Latex Rash  . Neomycin-Bacitracin Zn-Polymyx Rash  . Tape Rash    Latex Prefers paper tape    Family History  Problem Relation Age of Onset  . Hyperlipidemia Father   . Hypertension Father   . Cirrhosis Father   . Colon cancer Father 48       d. 66  . Colon cancer Sister 70       d. 66  . Bipolar disorder Sister   . Aneurysm Mother        brain  . Cerebral aneurysm Mother   . Colon cancer Sister 57       d. 4  . Bipolar disorder Sister   . Cancer Paternal Aunt        NOS, ? colon  . Congestive Heart Failure Maternal Grandmother   . Drug abuse Neg Hx   . CAD Neg Hx   . Stomach cancer Neg Hx   . Hyperparathyroidism Neg Hx   . Hypercalcemia Neg Hx     BP (!) 190/90 (BP Location: Right Arm, Patient Position: Sitting, Cuff Size: Large)   Pulse (!) 58   Ht 5' 8"  (1.727 m)   Wt 244 lb 9.6 oz (110.9 kg)   SpO2 96%   BMI 37.19 kg/m    Review of Systems Pt reports slight doe.      Objective:   Physical Exam VITAL SIGNS:  See vs page GENERAL: no distress EXT: 1+ bilat leg edema.    Lab Results  Component Value Date   PTH 196 (H)  05/07/2017   CALCIUM 11.1 (H) 04/03/2020   CAION 1.15 04/29/2013   PHOS 3.2 06/30/2013        Assessment & Plan:  Hyperparathyroidism: prob a combination of primary and secondary Vit-D def: rx of this would help to lower PTH.   HTN: uncontrolled. We need to improve this before we can reduce HCTZ.  Hypercalcemia,  contributed to by HCTZ.     Patient Instructions  Please start taking Vitamin-D, 5000 units per day.   We cannot reduce the HCTZ now, because of your blood pressure.   We will need to take this complex situation in stages.  I have sent a prescription to your pharmacy, to increase the furosemide.  Weight loss is the best treatment for the blood pressure.  Please come back for a follow-up appointment in 1 month.

## 2020-08-02 NOTE — Patient Instructions (Addendum)
Please start taking Vitamin-D, 5000 units per day.   We cannot reduce the HCTZ now, because of your blood pressure.   We will need to take this complex situation in stages.  I have sent a prescription to your pharmacy, to increase the furosemide.  Weight loss is the best treatment for the blood pressure.  Please come back for a follow-up appointment in 1 month.

## 2020-08-04 ENCOUNTER — Encounter: Payer: Self-pay | Admitting: Emergency Medicine

## 2020-08-04 ENCOUNTER — Other Ambulatory Visit: Payer: Self-pay

## 2020-08-04 ENCOUNTER — Ambulatory Visit
Admission: EM | Admit: 2020-08-04 | Discharge: 2020-08-04 | Disposition: A | Discharge status: Home / Self Care | Payer: Medicare Other | Attending: Family Medicine | Admitting: Family Medicine

## 2020-08-04 DIAGNOSIS — Z03818 Encounter for observation for suspected exposure to other biological agents ruled out: Secondary | ICD-10-CM | POA: Diagnosis not present

## 2020-08-04 DIAGNOSIS — Z20822 Contact with and (suspected) exposure to covid-19: Secondary | ICD-10-CM | POA: Diagnosis not present

## 2020-08-04 DIAGNOSIS — R059 Cough, unspecified: Secondary | ICD-10-CM

## 2020-08-04 LAB — POCT RAPID STREP A (OFFICE): Rapid Strep A Screen: NEGATIVE

## 2020-08-04 NOTE — Discharge Instructions (Addendum)
Your strep test was negative You can take tylenol as needed for pain Rest, hydrate.  Your covid test is pending.  Follow up as needed for continued or worsening symptoms

## 2020-08-04 NOTE — ED Triage Notes (Signed)
Pt presents today with cough and sore throat x 3 days. No meds pta. Denies fever.

## 2020-08-06 LAB — NOVEL CORONAVIRUS, NAA: SARS-CoV-2, NAA: DETECTED — AB

## 2020-08-06 LAB — SARS-COV-2, NAA 2 DAY TAT

## 2020-08-06 NOTE — ED Provider Notes (Signed)
[facility]    CSN: [id number] Arrival date & time: 08/04/20  1430      History   Chief Complaint Chief Complaint  Patient presents with  . Sore Throat  . Cough    HPI [name] is a 65 y.o. female.   Pt is a 64 year old female that presents with cough, sore throat, fever x 3 days. Symptoms have been constant. She has not taken anything for the symptoms. No chest pain, SOB.      Past Medical History:  Diagnosis Date  . Allergy   . Anemia   . ANEMIA, PERNICIOUS, HX OF 05/13/2007  . Anxiety   . Arthritis   . ASYMPTOMATIC POSTMENOPAUSAL STATUS 02/18/2008  . BACK PAIN, LUMBAR 11/19/2007  . Blood in urine 07/19/2019  . Bowel incontinence   . Cataract   . Cellulitis of left lower leg 10/28/2013  . Cellulitis of leg, right 10/11/2010  . Chronic dermatitis of hands 05/02/2009  . DEPRESSION, CHRONIC 10/06/2007  . Diabetes mellitus without complication ([place])   . DIABETES MELLITUS, WITH NEUROLOGICAL COMPLICATIONS 1/65/7903  . Diabetic neuropathy ([place])   . DYSLIPIDEMIA 05/13/2007  . Essential hypertension 03/20/2010  . Family history of colon cancer   . Fecal incontinence 08/02/2015  . Frozen shoulder    Lt  . Full dentures   . Genetic testing 11/24/2017   Negative genetic testing on the common hereditary cancer panel.  The Hereditary Gene Panel offered by [facility] includes sequencing and/or deletion duplication testing of the following 47 genes: APC, ATM, AXIN2, BARD1, BMPR1A, BRCA1, BRCA2, BRIP1, CDH1, CDK4, CDKN2A (p14ARF), CDKN2A (p16INK4a), CHEK2, CTNNA1, DICER1, EPCAM (Deletion/duplication testing only), GREM1 (promoter region deletion/duplicat  . GERD (gastroesophageal reflux disease)   . GI bleed 03/15/2016  . GOITER, MULTINODULAR 05/13/2007  . Headache   . History of blood transfusion   . Hx of colonic polyps 12/04/2010  . HYPERCHOLESTEROLEMIA 03/20/2010  . HYPERTENSION 03/20/2010  . Lower back pain   . NASH (nonalcoholic steatohepatitis)   . OA  (osteoarthritis)   . OBSTRUCTIVE SLEEP APNEA 06/12/2007   uses CPAP.  Marland Kitchen PERIPHERAL NEUROPATHY 01/12/2009  . Peripheral vascular disease ([place])   . Primary osteoarthritis of left knee 04/23/2011   Previously seen by Dr. Alphonzo Severance. Will plan on repeat referral after trial injection.     Marland Kitchen PSORIASIS 05/28/2010  . Rectal prolapse 05/24/2015  . Rectovaginal fistula 05/25/2014  . Renal insufficiency    stage 1 kd  . Spigelian hernia 02/05/2018  . Stress 05/24/2015  . Surgical counseling visit 10/02/2019  . Umbilical hernia without obstruction and without gangrene 04/15/2017  . Unilateral primary osteoarthritis, left knee   . UNSPECIFIED VENOUS INSUFFICIENCY 05/28/2010   Patient with frequent ulceration related to venous stasis-seen at wound care. Edema and Venous stasis changes due to this . Echo 04/16/11 with EF 60%. No signs diastolic dysfunction.       . Vaginal irritation 07/19/2019  . Wears glasses     Patient Active Problem List   Diagnosis Date Noted  . Vaccination against Streptococcus pneumoniae within past 5 years 04/12/2020  . Need for immunization against influenza 04/12/2020  . Serum calcium elevated 04/12/2020  . Advanced directives, counseling/discussion 04/12/2020  . Hallux rigidus of left foot 04/12/2020  . Hyperparathyroidism ([place]) 04/11/2020  . Presbycusis of both ears 04/11/2020  . No-show for appointment 03/08/2020  . Pacemaker   . Second degree AV block 02/08/2020  . S/P total knee arthroplasty, right 11/16/2019  . Pica  10/29/2019  . Hypothyroidism 07/19/2019  . Nausea 07/19/2019  . Sinus bradycardia 07/19/2019  . Screening mammogram, encounter for 07/12/2019  . Hypertension associated with diabetes ([place]) 07/11/2018  . Dyslipidemia associated with type 2 diabetes mellitus ([place]) 07/11/2018  . Short bowel syndrome 07/11/2018  . Peripheral autonomic neuropathy due to diabetes mellitus ([place]) 07/11/2018  . Status post left knee replacement 07/11/2018  . Major  depression in remission ([place]) 07/07/2018  . Primary osteoarthritis involving multiple joints 07/07/2018  . Coronary artery disease involving native coronary artery of native heart without angina pectoris 07/07/2018  . Paroxysmal atrial fibrillation ([place]) 05/11/2018  . Mild cognitive impairment 04/30/2018  . Generalized abdominal pain 02/05/2018  . Venous stasis dermatitis of right lower extremity 01/02/2018  . Dyslipidemia 04/15/2017  . Gastroesophageal reflux disease without esophagitis 04/15/2017  . B12 deficiency 12/13/2016  . Malabsorption 03/12/2016  . Tubular adenoma of colon 03/09/2016  . Vitamin D deficiency 03/09/2016  . S/P bariatric surgery-duodenal switch with sleeve gastrectomy 12/04/2013  . Nonobstructive CAD  08/26/2013  . Iron deficiency anemia 06/19/2012  . Low back pain 03/05/2012  . PSORIASIS 05/28/2010  . HYPERCHOLESTEROLEMIA 03/20/2010  . Peripheral neuropathy 01/12/2009  . Type 2 diabetes, controlled, with neuropathy ([place]) 02/18/2008  . Chronic depression 10/06/2007  . OBSTRUCTIVE SLEEP APNEA 06/12/2007  . GOITER, MULTINODULAR 05/13/2007    Past Surgical History:  Procedure Laterality Date  . CATARACT EXTRACTION W/ INTRAOCULAR LENS  IMPLANT, BILATERAL    . COLONOSCOPY    . ELECTROCARDIOGRAM  04/16/2006  . EXAMINATION UNDER ANESTHESIA N/A 06/29/2014   Procedure: EXAM UNDER ANESTHESIA;  Surgeon: Janyth Contes, MD;  Location: [facility];  Service: Gynecology;  Laterality: N/A;  . Exercise myoview  01/24/2005  . FLEXIBLE SIGMOIDOSCOPY    . GASTRIC BYPASS  11/09/2013  . GASTRIC BYPASS    . JOINT REPLACEMENT    . KNEE ARTHROSCOPY  2003   right  . LAPAROSCOPY N/A 03/12/2016   Procedure: LAPAROSCOPIC ANASTOMOSIS OF INTESTINE (ENTEROENTEROSTOMY);  Surgeon: Ladora Daniel, MD;  Location: [facility];  Service: General;  Laterality: N/A;  . LEG SURGERY Left    metal and pins in lower left leg  . mrsa Right    arm  . MULTIPLE TOOTH EXTRACTIONS    . PACEMAKER  LEADLESS INSERTION N/A 02/08/2020   Procedure: PACEMAKER LEADLESS INSERTION;  Surgeon: Thompson Grayer, MD;  Location: [facility];  Service: Cardiovascular;  Laterality: N/A;  . SHOULDER ARTHROSCOPY W/ ROTATOR CUFF REPAIR Right   . stab phlebectomy  Right 02/10/2019   stab phlebectomy > 20 incisions right leg by [name] MD   . TONSILLECTOMY    . TOTAL KNEE ARTHROPLASTY Left 06/30/2018   Procedure: LEFT TOTAL KNEE ARTHROPLASTY;  Surgeon: Meredith Pel, MD;  Location: [facility];  Service: Orthopedics;  Laterality: Left;  . TOTAL KNEE ARTHROPLASTY Right 11/16/2019  . TOTAL KNEE ARTHROPLASTY Right 11/16/2019   Procedure: RIGHT TOTAL KNEE ARTHROPLASTY-CEMENTED;  Surgeon: Meredith Pel, MD;  Location: [facility];  Service: Orthopedics;  Laterality: Right;  Marland Kitchen VARICOSE VEIN SURGERY     Remotef    OB History   No obstetric history on file.      Home Medications    Prior to Admission medications   Medication Sig Start Date End Date Taking? Authorizing Provider  amLODipine (NORVASC) 10 MG tablet TAKE 1 TABLET BY MOUTH EVERY DAY Patient taking differently: Take 10 mg by mouth daily. 01/25/20  Yes Daisy Floro, DO  apixaban Arne [place]) 5  MG TABS tablet Take 1 tablet (5 mg total) by mouth 2 (two) times daily. Resume with EVENING dose today. 02/09/20  Yes Shirley Friar, PA-C  atorvastatin (LIPITOR) 40 MG tablet TAKE 1 TABLET BY MOUTH EVERY DAY Patient taking differently: Take 40 mg by mouth daily. 01/25/20  Yes Milus Banister C, DO  celecoxib (CELEBREX) 200 MG capsule Take 1 capsule (200 mg total) by mouth 2 (two) times daily. 11/20/19  Yes Meredith Pel, MD  Cholecalciferol (VITAMIN D) 125 MCG (5000 UT) CAPS Take 5,000 Units by mouth daily. 05/30/20  Yes Renato Shin, MD  furosemide (LASIX) 40 MG tablet Take 1 tablet (40 mg total) by mouth daily. 08/02/20  Yes Renato Shin, MD  acetaminophen (TYLENOL) 325 MG tablet Take 2 tablets (650 mg total) by mouth every 4 (four)  hours as needed for headache or mild pain. 02/09/20   Shirley Friar, PA-C  ferrous sulfate 324 (65 Fe) MG TBEC TAKE 1 TABLET BY MOUTH EVERY OTHER DAY 01/25/20   Milus Banister C, DO  gabapentin (NEURONTIN) 100 MG capsule Take 100 mg by mouth every morning.    [provider]  gabapentin (NEURONTIN) 300 MG capsule TAKE 1 CAPSULE BY MOUTH THREE TIMES A DAY 03/31/20   Milus Banister C, DO  glucose blood (ACCU-CHEK AVIVA PLUS) test strip Use to check sugar once daily in the morning 07/17/18   Gerlene Fee, NP  lisinopril-hydrochlorothiazide (ZESTORETIC) 20-25 MG tablet Take 1 tablet by mouth daily. 10/29/19   Daisy Floro, DO  metFORMIN (GLUCOPHAGE) 1000 MG tablet Take 1 tablet (1,000 mg total) by mouth daily with breakfast. 11/19/18   Steve Rattler, DO  methocarbamol (ROBAXIN) 500 MG tablet Take 1 tablet (500 mg total) by mouth every 8 (eight) hours as needed for muscle spasms. 11/20/19   Meredith Pel, MD  methocarbamol (ROBAXIN) 500 MG tablet TAKE 1 TABLET BY MOUTH EVERY 6 HOURS AS NEEDED FOR MUSCLE SPASM Patient taking differently: Take 500 mg by mouth every 6 (six) hours as needed for muscle spasms. 12/10/19   Daisy Floro, DO  multivitamin-iron-minerals-folic acid (CENTRUM) chewable tablet Chew 1 tablet by mouth daily.     [provider]  NON FORMULARY Diet type:  NAS    [provider]  oxyCODONE (OXY IR/ROXICODONE) 5 MG immediate release tablet Take 1 tablet (5 mg total) by mouth every 4 (four) hours as needed for severe pain. 11/23/19   Magnant, Charles L, PA-C  sertraline (ZOLOFT) 100 MG tablet Take 1 tablet (100 mg total) by mouth daily. 11/10/18   Steve Rattler, DO  spironolactone (ALDACTONE) 25 MG tablet Take 1 tablet (25 mg total) by mouth daily. 12/29/19 03/28/20  Allred, Jeneen Rinks, MD  Teduglutide, rDNA, 5 MG KIT Inject 5 mg into the skin daily.     [provider]    Family History Family History  Problem Relation Age of  Onset  . Hyperlipidemia Father   . Hypertension Father   . Cirrhosis Father   . Colon cancer Father 7       d. 54  . Colon cancer Sister 48       d. 27  . Bipolar disorder Sister   . Aneurysm Mother        brain  . Cerebral aneurysm Mother   . Colon cancer Sister 78       d. 35  . Bipolar disorder Sister   . Cancer Paternal Aunt  NOS, ? colon  . Congestive Heart Failure Maternal Grandmother   . Drug abuse Neg Hx   . CAD Neg Hx   . Stomach cancer Neg Hx   . Hyperparathyroidism Neg Hx   . Hypercalcemia Neg Hx     Social History Social History   Tobacco Use  . Smoking status: Former Smoker    Packs/day: 2.00    Years: 24.00    Pack years: 48.00    Types: Cigarettes    Start date: 06/24/1970    Quit date: 08/06/1994    Years since quitting: 26.0  . Smokeless tobacco: Never Used  Vaping Use  . Vaping Use: Never used  Substance Use Topics  . Alcohol use: No    Alcohol/week: 0.0 standard drinks  . Drug use: No     Allergies   Cephalexin, Latex, Neomycin-bacitracin zn-polymyx, and Tape   Review of Systems Review of Systems   Physical Exam Triage Vital Signs ED Triage Vitals  Enc Vitals Group     BP 08/04/20 1505 (!) 177/71     Pulse Rate 08/04/20 1505 91     Resp 08/04/20 1505 20     Temp 08/04/20 1505 98.9 F (37.2 C)     Temp Source 08/04/20 1505 Oral     SpO2 08/04/20 1505 94 %     Weight --      Height --      Head Circumference --      Peak Flow --      Pain Score 08/04/20 1506 0     Pain Loc --      Pain Edu? --      Excl. in [place]? --    No data found.  Updated Vital Signs BP (!) 177/71 (BP Location: Left Arm)   Pulse 91   Temp 98.9 F (37.2 C) (Oral)   Resp 20   SpO2 94%   Visual Acuity Right Eye Distance:   Left Eye Distance:   Bilateral Distance:    Right Eye Near:   Left Eye Near:    Bilateral Near:     Physical Exam Vitals and nursing note reviewed.  Constitutional:      General: She is not in acute distress.     Appearance: Normal appearance. She is not ill-appearing, toxic-appearing or diaphoretic.  HENT:     Head: Normocephalic.     Right Ear: Tympanic membrane and ear canal normal.     Left Ear: Tympanic membrane and ear canal normal.     Nose: Congestion present.     Mouth/Throat:     Pharynx: Oropharynx is clear.  Eyes:     Conjunctiva/sclera: Conjunctivae normal.  Cardiovascular:     Rate and Rhythm: Normal rate and regular rhythm.  Pulmonary:     Effort: Pulmonary effort is normal.     Breath sounds: Normal breath sounds.  Musculoskeletal:        General: Normal range of motion.     Cervical back: Normal range of motion.  Skin:    General: Skin is warm and dry.     Findings: No rash.  Neurological:     Mental Status: She is alert.  Psychiatric:        Mood and Affect: Mood normal.      UC Treatments / Results  Labs (all labs ordered are listed, but only abnormal results are displayed) Labs Reviewed  NOVEL CORONAVIRUS, NAA - Abnormal; Notable for the following components:      Result  Value   SARS-CoV-2, NAA Detected (*)    All other components within normal limits   Narrative:    Performed at:  [address], [place], [place]  [id number] Lab Director: Rush Farmer MD, Phone:  [phone]  CULTURE, GROUP A STREP [facility])  SARS-COV-2, NAA 2 DAY TAT   Narrative:    Performed at:  [address], [place], [place]  [id number] Lab Director: Rush Farmer MD, Phone:  [phone]  POCT RAPID STREP A (OFFICE)    EKG   Radiology No results found.  Procedures Procedures (including critical care time)  Medications Ordered in UC Medications - No data to display  Initial Impression / Assessment and Plan / UC Course  I have reviewed the triage vital signs and the nursing notes.  Pertinent labs & imaging results that were available during my care of the patient were reviewed by me and considered in my medical decision making  (see chart for details).     Cough Nothing concerning on exam Strep test negative covid test pending.  OTC medicines as needed   Final Clinical Impressions(s) / UC Diagnoses   Final diagnoses:  Cough     Discharge Instructions     Your strep test was negative You can take tylenol as needed for pain Rest, hydrate.  Your covid test is pending.  Follow up as needed for continued or worsening symptoms     ED Prescriptions    None     PDMP not reviewed this encounter.   Loura Halt A, NP 08/06/20 1051

## 2020-08-07 ENCOUNTER — Telehealth: Payer: Self-pay

## 2020-08-07 ENCOUNTER — Encounter: Payer: Self-pay | Admitting: Family Medicine

## 2020-08-07 ENCOUNTER — Other Ambulatory Visit (HOSPITAL_COMMUNITY): Payer: Self-pay | Admitting: Adult Health

## 2020-08-07 ENCOUNTER — Ambulatory Visit (HOSPITAL_COMMUNITY)
Admission: RE | Admit: 2020-08-07 | Discharge: 2020-08-07 | Disposition: A | Discharge status: Home / Self Care | Payer: Medicare Other | Source: Ambulatory Visit | Attending: Pulmonary Disease | Admitting: Pulmonary Disease

## 2020-08-07 ENCOUNTER — Encounter: Payer: Self-pay | Admitting: Adult Health

## 2020-08-07 DIAGNOSIS — U071 COVID-19: Secondary | ICD-10-CM

## 2020-08-07 DIAGNOSIS — R413 Other amnesia: Secondary | ICD-10-CM | POA: Insufficient documentation

## 2020-08-07 LAB — CULTURE, GROUP A STREP (THRC)

## 2020-08-07 MED ORDER — SODIUM CHLORIDE 0.9 % IV SOLN: INTRAVENOUS | Status: DC | PRN

## 2020-08-07 MED ORDER — FAMOTIDINE IN NACL 20-0.9 MG/50ML-% IV SOLN: 20.0000 mg | Freq: Once | INTRAVENOUS | Status: DC | PRN

## 2020-08-07 MED ORDER — EPINEPHRINE 0.3 MG/0.3ML IJ SOAJ: 0.3000 mg | Freq: Once | INTRAMUSCULAR | Status: DC | PRN

## 2020-08-07 MED ORDER — DIPHENHYDRAMINE HCL 50 MG/ML IJ SOLN: 50.0000 mg | Freq: Once | INTRAMUSCULAR | Status: DC | PRN

## 2020-08-07 MED ORDER — SOTROVIMAB 500 MG/8ML IV SOLN
500.0000 mg | Freq: Once | INTRAVENOUS | Status: AC
  Administered 2020-08-07: 500 mg via INTRAVENOUS

## 2020-08-07 MED ORDER — METFORMIN HCL 1000 MG PO TABS: 1000.0000 mg | ORAL_TABLET | Freq: Two times a day (BID) | ORAL | 2 refills | Status: DC

## 2020-08-07 MED ORDER — ALBUTEROL SULFATE HFA 108 (90 BASE) MCG/ACT IN AERS: 2.0000 | INHALATION_SPRAY | Freq: Once | RESPIRATORY_TRACT | Status: DC | PRN

## 2020-08-07 MED ORDER — METHYLPREDNISOLONE SODIUM SUCC 125 MG IJ SOLR: 125.0000 mg | Freq: Once | INTRAMUSCULAR | Status: DC | PRN

## 2020-08-07 NOTE — Discharge Instructions (Signed)

## 2020-08-07 NOTE — Assessment & Plan Note (Signed)
Patient expressed concerns regarding her memory.  Patient interested in being evaluated by geriatrics clinic. -Contact initiated regarding scheduling patient for geriatrics clinic

## 2020-08-07 NOTE — Assessment & Plan Note (Signed)
A1c today 7.9%, last time was 8%. Goal 7%. Patient continues on Metformin, was only taking once daily.  -Continue Metformin 109m BID -Recheck A1c in 3 months -Consider GLP agonist or SGLT2 for better control and cardiac/renal benefits

## 2020-08-07 NOTE — Progress Notes (Signed)
I connected by phone with [name] on 08/07/2020 at 10:03 AM to discuss the potential use of a new treatment for mild to moderate COVID-19 viral infection in non-hospitalized patients.  This patient is a 65 y.o. female that meets the FDA criteria for Emergency Use Authorization of COVID monoclonal antibody sotrovimab.  Has a (+) direct SARS-CoV-2 viral test result  Has mild or moderate COVID-19   Is NOT hospitalized due to COVID-19  Is within 10 days of symptom onset  Has at least one of the high risk factor(s) for progression to severe COVID-19 and/or hospitalization as defined in EUA.  Specific high risk criteria : Older age (>/= 66 yo), BMI > 25, Diabetes and Cardiovascular disease or hypertension   I have spoken and communicated the following to the patient or parent/caregiver regarding COVID monoclonal antibody treatment:  1. FDA has authorized the emergency use for the treatment of mild to moderate COVID-19 in adults and pediatric patients with positive results of direct SARS-CoV-2 viral testing who are 31 years of age and older weighing at least 40 kg, and who are at high risk for progressing to severe COVID-19 and/or hospitalization.  2. The significant known and potential risks and benefits of COVID monoclonal antibody, and the extent to which such potential risks and benefits are unknown.  3. Information on available alternative treatments and the risks and benefits of those alternatives, including clinical trials.  4. Patients treated with COVID monoclonal antibody should continue to self-isolate and use infection control measures (e.g., wear mask, isolate, social distance, avoid sharing personal items, clean and disinfect "high touch" surfaces, and frequent handwashing) according to CDC guidelines.   5. The patient or parent/caregiver has the option to accept or refuse COVID monoclonal antibody treatment.  After reviewing this information with the patient, the patient has  agreed to receive one of the available covid 19 monoclonal antibodies and will be provided an appropriate fact sheet prior to infusion. Scot Dock, NP 08/07/2020 10:03 AM

## 2020-08-07 NOTE — Progress Notes (Signed)
Diagnosis: COVID-19  Physician: Dr. Patrick Wright  Procedure: Covid Infusion Clinic Med: Sotrovimab infusion - Provided patient with sotrovimab fact sheet for patients, parents, and caregivers prior to infusion.   Complications: No immediate complications noted  Discharge: Discharged home    

## 2020-08-07 NOTE — Telephone Encounter (Signed)
Called to discuss with patient about COVID-19 symptoms and the use of one of the available treatments for those with mild to moderate Covid symptoms and at a high risk of hospitalization.  Pt appears to qualify for outpatient treatment due to co-morbid conditions and/or a member of an at-risk group in accordance with the FDA Emergency Use Authorization.    Symptom onset: 08/02/20 Vaccinated: Yes Booster? Yes Immunocompromised? No Qualifiers: Pacemaker,DM, HTN  Pt. Would like to speak to an APP.   [name]

## 2020-08-07 NOTE — Progress Notes (Signed)
Patient reviewed Fact Sheet for Patients, Parents, and Caregivers for Emergency Use Authorization (EUA) of sotrovimab for the Treatment of Coronavirus. Patient also reviewed and is agreeable to the estimated cost of treatment. Patient is agreeable to proceed.   

## 2020-08-08 ENCOUNTER — Encounter: Payer: Self-pay | Admitting: Family Medicine

## 2020-08-10 ENCOUNTER — Telehealth: Payer: Self-pay

## 2020-08-10 ENCOUNTER — Ambulatory Visit (INDEPENDENT_AMBULATORY_CARE_PROVIDER_SITE_OTHER): Payer: Medicare Other

## 2020-08-10 DIAGNOSIS — I441 Atrioventricular block, second degree: Secondary | ICD-10-CM | POA: Diagnosis not present

## 2020-08-10 NOTE — Telephone Encounter (Signed)
Spoke with pt made appt for Geri clinic on 3/17 at 1:30. Asked pt to bring all medication, and I also informed that I will be sending out the [place]. Salvatore Marvel, CMA

## 2020-08-10 NOTE — Telephone Encounter (Signed)
-----   Message from [place], MD sent at 08/08/2020  3:43 PM EST ----- Regarding: FW: Geriatrics/Memory help for patient Cherrie Distance, Could you schedule Ms Stockham in [facility] clinic for a memory assessment Sherren Mocha ----- Message ----- From: Daisy Floro, DO Sent: 08/07/2020   6:48 PM EST To: Laban Emperor Fleeger, CMA, Blane Ohara McDiarmid, MD Subject: Geriatrics/Memory help for patient             Hello Dr. Wendy Poet and Janett Billow,   With Jazmin out I didn't know the best way to go about helping this patient. She has expressed concerns for her memory, that it is declining. 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 I was wondering if we could consider her for evaluation with the Geriatrics Clinic. I had mentioned this opportunity to her and she expressed interest in it.   Please advise,   Milus Banister, [place], PGY-3 08/07/2020 6:48 PM

## 2020-08-12 LAB — CUP PACEART REMOTE DEVICE CHECK
Battery Remaining Longevity: 96 mo
Battery Voltage: 3.11 V
Brady Statistic RV Percent Paced: 18.57 %
Date Time Interrogation Session: 20220218130444
Implantable Pulse Generator Implant Date: 20210817
Lead Channel Impedance Value: 480 Ohm
Lead Channel Pacing Threshold Amplitude: 0.625 V
Lead Channel Pacing Threshold Pulse Width: 0.4 ms
Lead Channel Sensing Intrinsic Amplitude: 23.288 mV
Lead Channel Setting Pacing Amplitude: 1.125
Lead Channel Setting Pacing Pulse Width: 0.4 ms
Lead Channel Setting Sensing Sensitivity: 2 mV

## 2020-08-17 NOTE — Progress Notes (Signed)
Remote pacemaker transmission.   

## 2020-08-18 ENCOUNTER — Ambulatory Visit: Payer: Medicare Other

## 2020-08-18 ENCOUNTER — Ambulatory Visit
Admission: RE | Admit: 2020-08-18 | Discharge: 2020-08-18 | Disposition: A | Discharge status: Home / Self Care | Payer: Medicare Other | Source: Ambulatory Visit | Attending: Family Medicine | Admitting: Family Medicine

## 2020-08-18 ENCOUNTER — Other Ambulatory Visit: Payer: Self-pay

## 2020-08-18 ENCOUNTER — Ambulatory Visit (INDEPENDENT_AMBULATORY_CARE_PROVIDER_SITE_OTHER): Payer: Medicare Other

## 2020-08-18 VITALS — BP 189/92 | HR 77 | Temp 99.1°F | Resp 20

## 2020-08-18 DIAGNOSIS — R051 Acute cough: Secondary | ICD-10-CM | POA: Diagnosis not present

## 2020-08-18 DIAGNOSIS — J0110 Acute frontal sinusitis, unspecified: Secondary | ICD-10-CM | POA: Diagnosis not present

## 2020-08-18 MED ORDER — BENZONATATE 100 MG PO CAPS: 200.0000 mg | ORAL_CAPSULE | Freq: Three times a day (TID) | ORAL | 0 refills | Status: DC | PRN

## 2020-08-18 MED ORDER — AMOXICILLIN-POT CLAVULANATE 875-125 MG PO TABS: 1.0000 | ORAL_TABLET | Freq: Two times a day (BID) | ORAL | 0 refills | Status: DC

## 2020-08-18 MED ORDER — FLUTICASONE PROPIONATE 50 MCG/ACT NA SUSP
2.0000 | Freq: Every day | NASAL | 2 refills | Status: DC
Start: 2020-08-18 — End: 2020-12-27

## 2020-08-18 NOTE — Discharge Instructions (Addendum)
Your x ray was okay. I am going to treat you for a sinus infection.  Take the amoxicillin as prescribed Flonase daily  Tessalon pearls for cough as needed.

## 2020-08-18 NOTE — ED Provider Notes (Signed)
Roderic Palau    CSN: [id number] Arrival date & time: 08/18/20  1106      History   Chief Complaint Chief Complaint  Patient presents with  . Cough  . Shortness of Breath    HPI [name] is a 65 y.o. female.   Pt is a 65 year old female that presents with sinus congestion, cough, mild SOB, headaches and fever. Seen here on 08/04/20 and tested positive for covid. Had the covid antibody infusion. Felt somewhat better until about 4-5 days ago. Taking Nyquil for symptoms. Chest pain with coughing.    Cough Associated symptoms: shortness of breath   Shortness of Breath Associated symptoms: cough     Past Medical History:  Diagnosis Date  . Advanced directives, counseling/discussion 04/12/2020  . Allergy   . Anemia   . ANEMIA, PERNICIOUS, HX OF 05/13/2007  . Anxiety   . Arthritis   . ASYMPTOMATIC POSTMENOPAUSAL STATUS 02/18/2008  . B12 deficiency 12/13/2016  . BACK PAIN, LUMBAR 11/19/2007  . Blood in urine 07/19/2019  . Bowel incontinence   . Cataract   . Cellulitis of left lower leg 10/28/2013  . Cellulitis of leg, right 10/11/2010  . Chronic depression 10/06/2007  . Chronic dermatitis of hands 05/02/2009  . Coronary artery disease involving native coronary artery of native heart without angina pectoris 07/07/2018   Last Assessment & Plan:  Formatting of this note might be different from the original. The patient reports remote cardiac catheterization with up to 40% plaque.  Subsequent Cardiolite stress test in October 2019 did not show any evidence of active ischemia or prior infarction. Formatting of this note might be different from the original. medical  Last Assessment & Plan:  Formatting of this note mi  . DEPRESSION, CHRONIC 10/06/2007  . Diabetes mellitus without complication ([place])   . DIABETES MELLITUS, WITH NEUROLOGICAL COMPLICATIONS 11/08/6158  . Diabetic neuropathy ([place])   . DYSLIPIDEMIA 05/13/2007  . Dyslipidemia 04/15/2017   Last Assessment & Plan:  The  patient will continue atorvastatin for her dyslipidemia.  . Dyslipidemia associated with type 2 diabetes mellitus ([place]) 07/11/2018  . Essential hypertension 03/20/2010  . Family history of colon cancer   . Fecal incontinence 08/02/2015  . Frozen shoulder    Lt  . Full dentures   . Gastroesophageal reflux disease without esophagitis 04/15/2017  . Generalized abdominal pain 02/05/2018  . Genetic testing 11/24/2017   Negative genetic testing on the common hereditary cancer panel.  The Hereditary Gene Panel offered by [facility] includes sequencing and/or deletion duplication testing of the following 47 genes: APC, ATM, AXIN2, BARD1, BMPR1A, BRCA1, BRCA2, BRIP1, CDH1, CDK4, CDKN2A (p14ARF), CDKN2A (p16INK4a), CHEK2, CTNNA1, DICER1, EPCAM (Deletion/duplication testing only), GREM1 (promoter region deletion/duplicat  . GERD (gastroesophageal reflux disease)   . GI bleed 03/15/2016  . GOITER, MULTINODULAR 05/13/2007  . GOITER, MULTINODULAR 05/13/2007    (last TSH 3.26; US soft tissue neck in 09/2004 showed multinodular goiter with specific small nodule for which was recommended to be followed by repeat US in 3-6 months 12/2016: Thyroid US: recommend repeat in 1 year (one nodule 1.7cm on left thyroid)   . Headache   . History of blood transfusion   . Hx of colonic polyps 12/04/2010  . HYPERCHOLESTEROLEMIA 03/20/2010  . Hyperparathyroidism ([place]) 04/11/2020  . HYPERTENSION 03/20/2010  . Hypertension associated with diabetes ([place]) 07/11/2018  . Hypothyroidism 07/19/2019  . Iron deficiency anemia 06/19/2012  . Low back pain 03/05/2012   Completed PT-notes say very motivated and  had good progress.   CT (03/2020: Mild curvature convex to the right in the upper lumbar region into left in lower lumbar region. No antero or retrolisthesis in the supine position. retrolisthesis at L3-4 of 3 mm with flexion which increases to 6 mm with neutral and extension. Multifactorial spinal stenosis at L3-4 due to circumferential  protrusion of the   . Lower back pain   . Major depression in remission ([place]) 07/07/2018   Last Assessment & Plan:  Mood has seemed good on sertraline.  . Malabsorption 03/12/2016  . NASH (nonalcoholic steatohepatitis)   . Nausea 07/19/2019  . Need for immunization against influenza 04/12/2020  . No-show for appointment 03/08/2020  . Nonobstructive CAD  08/26/2013   The patient has prior cardiac workup in 2006 when she underwent a stress test for abnormal EKG. The stress test was nonconclusive and she underwent cardiac catheterization that showed 30% stenosis in the LAD in 25% stenosis in RCA she was treated medically.    . OA (osteoarthritis)   . OBSTRUCTIVE SLEEP APNEA 06/12/2007   uses CPAP.  Marland Kitchen Pacemaker   . Paroxysmal atrial fibrillation ([place]) 05/11/2018   Last Assessment & Plan:  Patient was noted to have episodes of paroxysmal atrial fibrillation that was predominantly rate controlled and at a relatively low burden.  She is chronically on anticoagulation now with Eliquis.  She does not take any negative chronotropic agents with her sinus bradycardia.  If she has any breakthrough more prolonged tachycardic episodes and requires negative chronotropi  . Peripheral autonomic neuropathy due to diabetes mellitus ([place]) 07/11/2018  . PERIPHERAL NEUROPATHY 01/12/2009  . Peripheral neuropathy 01/12/2009  . Peripheral vascular disease ([place])   . Presbycusis of both ears 04/11/2020  . Primary osteoarthritis involving multiple joints 07/07/2018   Last Assessment & Plan:  Post op left tka and here for rehab as her husband is handicapped so she has limited help at home. Consitpation noted, some pain overnight, better with a pillow under her knee.  . Primary osteoarthritis of left knee 04/23/2011   Previously seen by Dr. Alphonzo Severance. Will plan on repeat referral after trial injection.     Marland Kitchen PSORIASIS 05/28/2010  . Rectal prolapse 05/24/2015  . Rectovaginal fistula 05/25/2014  . Renal insufficiency    stage 1  kd  . S/P bariatric surgery-duodenal switch with sleeve gastrectomy 12/04/2013  . S/P total knee arthroplasty, right 11/16/2019  . Screening mammogram, encounter for 07/12/2019  . Second degree AV block 02/08/2020  . Serum calcium elevated 04/12/2020  . Short bowel syndrome 07/11/2018  . Spigelian hernia 02/05/2018  . Status post left knee replacement 07/11/2018  . Stress 05/24/2015  . Surgical counseling visit 10/02/2019  . Tubular adenoma of colon 03/09/2016   Colonoscopy in 10/2015: 3  tubular adenomas; GI recommends repeat colonoscopy in 3 years Colonoscopy August 2012: 3 tubular adenomas.   . Type 2 diabetes, controlled, with neuropathy ([place]) 02/18/2008  . Umbilical hernia without obstruction and without gangrene 04/15/2017  . Unilateral primary osteoarthritis, left knee   . UNSPECIFIED VENOUS INSUFFICIENCY 05/28/2010   Patient with frequent ulceration related to venous stasis-seen at wound care. Edema and Venous stasis changes due to this . Echo 04/16/11 with EF 60%. No signs diastolic dysfunction.       . Vaccination against Streptococcus pneumoniae within past 5 years 04/12/2020  . Vaginal irritation 07/19/2019  . Venous stasis dermatitis of right lower extremity 01/02/2018  . Vitamin D deficiency 03/09/2016  . Wears glasses  Patient Active Problem List   Diagnosis Date Noted  . Memory difficulties 08/07/2020  . Serum calcium elevated 04/12/2020  . Hallux rigidus of left foot 04/12/2020  . Hyperparathyroidism ([place]) 04/11/2020  . Presbycusis of both ears 04/11/2020  . Pacemaker   . Hypothyroidism 07/19/2019  . Hypertension associated with diabetes ([place]) 07/11/2018  . Dyslipidemia associated with type 2 diabetes mellitus ([place]) 07/11/2018  . Short bowel syndrome 07/11/2018  . Status post left knee replacement 07/11/2018  . Major depression in remission ([place]) 07/07/2018  . Coronary artery disease involving native coronary artery of native heart without angina pectoris 07/07/2018  .  Paroxysmal atrial fibrillation ([place]) 05/11/2018  . Mild cognitive impairment 04/30/2018  . Gastroesophageal reflux disease without esophagitis 04/15/2017  . B12 deficiency 12/13/2016  . Tubular adenoma of colon 03/09/2016  . Vitamin D deficiency 03/09/2016  . S/P bariatric surgery-duodenal switch with sleeve gastrectomy 12/04/2013  . Iron deficiency anemia 06/19/2012  . Low back pain 03/05/2012  . PSORIASIS 05/28/2010  . Peripheral neuropathy 01/12/2009  . Type 2 diabetes, controlled, with neuropathy ([place]) 02/18/2008  . OBSTRUCTIVE SLEEP APNEA 06/12/2007    Past Surgical History:  Procedure Laterality Date  . CATARACT EXTRACTION W/ INTRAOCULAR LENS  IMPLANT, BILATERAL    . COLONOSCOPY    . ELECTROCARDIOGRAM  04/16/2006  . EXAMINATION UNDER ANESTHESIA N/A 06/29/2014   Procedure: EXAM UNDER ANESTHESIA;  Surgeon: Janyth Contes, MD;  Location: [facility];  Service: Gynecology;  Laterality: N/A;  . Exercise myoview  01/24/2005  . FLEXIBLE SIGMOIDOSCOPY    . GASTRIC BYPASS  11/09/2013  . GASTRIC BYPASS    . JOINT REPLACEMENT    . KNEE ARTHROSCOPY  2003   right  . LAPAROSCOPY N/A 03/12/2016   Procedure: LAPAROSCOPIC ANASTOMOSIS OF INTESTINE (ENTEROENTEROSTOMY);  Surgeon: Ladora Daniel, MD;  Location: [facility];  Service: General;  Laterality: N/A;  . LEG SURGERY Left    metal and pins in lower left leg  . mrsa Right    arm  . MULTIPLE TOOTH EXTRACTIONS    . PACEMAKER LEADLESS INSERTION N/A 02/08/2020   Procedure: PACEMAKER LEADLESS INSERTION;  Surgeon: Thompson Grayer, MD;  Location: [facility];  Service: Cardiovascular;  Laterality: N/A;  . SHOULDER ARTHROSCOPY W/ ROTATOR CUFF REPAIR Right   . stab phlebectomy  Right 02/10/2019   stab phlebectomy > 20 incisions right leg by [name] MD   . TONSILLECTOMY    . TOTAL KNEE ARTHROPLASTY Left 06/30/2018   Procedure: LEFT TOTAL KNEE ARTHROPLASTY;  Surgeon: Meredith Pel, MD;  Location: [facility];  Service: Orthopedics;   Laterality: Left;  . TOTAL KNEE ARTHROPLASTY Right 11/16/2019  . TOTAL KNEE ARTHROPLASTY Right 11/16/2019   Procedure: RIGHT TOTAL KNEE ARTHROPLASTY-CEMENTED;  Surgeon: Meredith Pel, MD;  Location: [facility];  Service: Orthopedics;  Laterality: Right;  Marland Kitchen VARICOSE VEIN SURGERY     Remotef    OB History   No obstetric history on file.      Home Medications    Prior to Admission medications   Medication Sig Start Date End Date Taking? Authorizing Provider  amoxicillin-clavulanate (AUGMENTIN) 875-125 MG tablet Take 1 tablet by mouth every 12 (twelve) hours. 08/18/20  Yes Quindon Denker A, NP  benzonatate (TESSALON) 100 MG capsule Take 2 capsules (200 mg total) by mouth 3 (three) times daily as needed for cough. 08/18/20  Yes Cabe Lashley A, NP  fluticasone (FLONASE) 50 MCG/ACT nasal spray Place 2 sprays into both nostrils daily. 08/18/20  Yes Deren Degrazia,  Bula Cavalieri A, NP  acetaminophen (TYLENOL) 325 MG tablet Take 2 tablets (650 mg total) by mouth every 4 (four) hours as needed for headache or mild pain. 02/09/20   Shirley Friar, PA-C  amLODipine (NORVASC) 10 MG tablet TAKE 1 TABLET BY MOUTH EVERY DAY Patient taking differently: Take 10 mg by mouth daily. 01/25/20   Daisy Floro, DO  apixaban (ELIQUIS) 5 MG TABS tablet Take 1 tablet (5 mg total) by mouth 2 (two) times daily. Resume with EVENING dose today. 02/09/20   Shirley Friar, PA-C  atorvastatin (LIPITOR) 40 MG tablet TAKE 1 TABLET BY MOUTH EVERY DAY Patient taking differently: Take 40 mg by mouth daily. 01/25/20   Daisy Floro, DO  celecoxib (CELEBREX) 200 MG capsule Take 1 capsule (200 mg total) by mouth 2 (two) times daily. 11/20/19   Meredith Pel, MD  Cholecalciferol (VITAMIN D) 125 MCG (5000 UT) CAPS Take 5,000 Units by mouth daily. 05/30/20   Renato Shin, MD  ferrous sulfate 324 (65 Fe) MG TBEC TAKE 1 TABLET BY MOUTH EVERY OTHER DAY 01/25/20   Milus Banister C, DO  furosemide (LASIX) 40 MG tablet Take 1 tablet  (40 mg total) by mouth daily. 08/02/20   Renato Shin, MD  gabapentin (NEURONTIN) 300 MG capsule TAKE 1 CAPSULE BY MOUTH THREE TIMES A DAY 03/31/20   Milus Banister C, DO  glucose blood (ACCU-CHEK AVIVA PLUS) test strip Use to check sugar once daily in the morning 07/17/18   Gerlene Fee, NP  lisinopril-hydrochlorothiazide (ZESTORETIC) 20-25 MG tablet Take 1 tablet by mouth daily. 10/29/19   Daisy Floro, DO  metFORMIN (GLUCOPHAGE) 1000 MG tablet Take 1 tablet (1,000 mg total) by mouth 2 (two) times daily with a meal. 08/07/20   Daisy Floro, DO  methocarbamol (ROBAXIN) 500 MG tablet Take 1 tablet (500 mg total) by mouth every 8 (eight) hours as needed for muscle spasms. 11/20/19   Meredith Pel, MD  methocarbamol (ROBAXIN) 500 MG tablet TAKE 1 TABLET BY MOUTH EVERY 6 HOURS AS NEEDED FOR MUSCLE SPASM Patient taking differently: Take 500 mg by mouth every 6 (six) hours as needed for muscle spasms. 12/10/19   Daisy Floro, DO  multivitamin-iron-minerals-folic acid (CENTRUM) chewable tablet Chew 1 tablet by mouth daily.     [provider]  oxyCODONE (OXY IR/ROXICODONE) 5 MG immediate release tablet Take 1 tablet (5 mg total) by mouth every 4 (four) hours as needed for severe pain. 11/23/19   Magnant, Charles L, PA-C  sertraline (ZOLOFT) 100 MG tablet Take 1 tablet (100 mg total) by mouth daily. 11/10/18   Steve Rattler, DO  spironolactone (ALDACTONE) 25 MG tablet Take 1 tablet (25 mg total) by mouth daily. 12/29/19 03/28/20  Allred, Jeneen Rinks, MD  Teduglutide, rDNA, 5 MG KIT Inject 5 mg into the skin daily.     [provider]    Family History Family History  Problem Relation Age of Onset  . Hyperlipidemia Father   . Hypertension Father   . Cirrhosis Father   . Colon cancer Father 71       d. 75  . Colon cancer Sister 8       d. 69  . Bipolar disorder Sister   . Aneurysm Mother        brain  . Cerebral aneurysm Mother   . Colon cancer Sister 16       d.  62  . Bipolar disorder Sister   . Cancer  Paternal Aunt        NOS, ? colon  . Congestive Heart Failure Maternal Grandmother   . Drug abuse Neg Hx   . CAD Neg Hx   . Stomach cancer Neg Hx   . Hyperparathyroidism Neg Hx   . Hypercalcemia Neg Hx     Social History Social History   Tobacco Use  . Smoking status: Former Smoker    Packs/day: 2.00    Years: 24.00    Pack years: 48.00    Types: Cigarettes    Start date: 06/24/1970    Quit date: 08/06/1994    Years since quitting: 26.0  . Smokeless tobacco: Never Used  Vaping Use  . Vaping Use: Never used  Substance Use Topics  . Alcohol use: No    Alcohol/week: 0.0 standard drinks  . Drug use: No     Allergies   Cephalexin, Latex, Neomycin-bacitracin zn-polymyx, and Tape   Review of Systems Review of Systems  Respiratory: Positive for cough and shortness of breath.      Physical Exam Triage Vital Signs ED Triage Vitals  Enc Vitals Group     BP 08/18/20 1144 (!) 189/92     Pulse Rate 08/18/20 1144 77     Resp 08/18/20 1144 20     Temp 08/18/20 1144 99.1 F (37.3 C)     Temp Source 08/18/20 1144 Oral     SpO2 08/18/20 1144 98 %     Weight --      Height --      Head Circumference --      Peak Flow --      Pain Score 08/18/20 1205 2     Pain Loc --      Pain Edu? --      Excl. in [place]? --    No data found.  Updated Vital Signs BP (!) 189/92 (BP Location: Left Arm)   Pulse 77   Temp 99.1 F (37.3 C) (Oral)   Resp 20   SpO2 98%   Visual Acuity Right Eye Distance:   Left Eye Distance:   Bilateral Distance:    Right Eye Near:   Left Eye Near:    Bilateral Near:     Physical Exam Vitals and nursing note reviewed.  Constitutional:      General: She is not in acute distress.    Appearance: Normal appearance. She is not ill-appearing, toxic-appearing or diaphoretic.  HENT:     Head: Normocephalic.     Nose: Congestion present.     Mouth/Throat:     Pharynx: Oropharynx is clear.  Eyes:      Conjunctiva/sclera: Conjunctivae normal.  Cardiovascular:     Rate and Rhythm: Normal rate and regular rhythm.  Pulmonary:     Effort: Pulmonary effort is normal.     Breath sounds: Normal breath sounds.  Musculoskeletal:        General: Normal range of motion.     Cervical back: Normal range of motion.  Skin:    General: Skin is warm and dry.     Findings: No rash.  Neurological:     Mental Status: She is alert.  Psychiatric:        Mood and Affect: Mood normal.      UC Treatments / Results  Labs (all labs ordered are listed, but only abnormal results are displayed) Labs Reviewed - No data to display  EKG   Radiology DG Chest 2 View  Result Date: 08/18/2020 CLINICAL DATA:  Cough, congestion, and shortness of breath for the past 2 weeks. EXAM: CHEST - 2 VIEW COMPARISON:  Chest x-ray dated February 09, 2020. FINDINGS: The heart size and mediastinal contours are within normal limits. Unchanged leadless pacemaker overlying the left heart. Normal pulmonary vascularity. No focal consolidation, pleural effusion, or pneumothorax. No acute osseous abnormality. IMPRESSION: 1. No acute cardiopulmonary disease. Electronically Signed   By: Titus Dubin M.D.   On: 08/18/2020 11:56    Procedures Procedures (including critical care time)  Medications Ordered in UC Medications - No data to display  Initial Impression / Assessment and Plan / UC Course  I have reviewed the triage vital signs and the nursing notes.  Pertinent labs & imaging results that were available during my care of the patient were reviewed by me and considered in my medical decision making (see chart for details).     Sinusitis I believe that are symptoms are due to severe sinus congestion sinus drainage.  Chest x ray normal No acute findings on exam.  Will treat with amoxicillin Tessalon pearls for cough as needed.  Flonase daily.  Follow up as needed for continued or worsening symptoms  Final Clinical  Impressions(s) / UC Diagnoses   Final diagnoses:  Acute non-recurrent frontal sinusitis     Discharge Instructions     Your x ray was okay. I am going to treat you for a sinus infection.  Take the amoxicillin as prescribed Flonase daily  Tessalon pearls for cough as needed.     ED Prescriptions    Medication Sig Dispense Auth. Provider   amoxicillin-clavulanate (AUGMENTIN) 875-125 MG tablet Take 1 tablet by mouth every 12 (twelve) hours. 14 tablet Ashwath Lasch A, NP   benzonatate (TESSALON) 100 MG capsule Take 2 capsules (200 mg total) by mouth 3 (three) times daily as needed for cough. 45 capsule Kylieann Eagles A, NP   fluticasone (FLONASE) 50 MCG/ACT nasal spray Place 2 sprays into both nostrils daily. 16 g Loura Halt A, NP     PDMP not reviewed this encounter.   Orvan July, NP 08/18/20 1309

## 2020-08-18 NOTE — ED Triage Notes (Signed)
Pt c/o persistent cough non-productive, HA, SOB with minimal exertion, subjective fever,   Denies CP, n/v/d Has taken Nyquil only twice No Rx taken today

## 2020-08-22 ENCOUNTER — Ambulatory Visit: Payer: Self-pay | Admitting: Licensed Clinical Social Worker

## 2020-08-22 NOTE — Chronic Care Management (AMB) (Signed)
  Care Management  Consultation Note  08/22/2020 Name: LINDSEY DEMONTE MRN: [id number] DOB: 03-26-1956  Nena Jordan Muscat is a 65 y.o. year old female who is a primary care patient of Daisy Floro, DO. The CCM team was consulted for Consultationcare coordination needs for caregiver  Assessment: Patient cares for her husband and needs additional support in the home. Intervention: Patient was not interviewed or contacted during this encounter.   CCM LCSW collaborated with PCP via [employer] . Conducted brief assessment, recommendations and relevant information discussed.   Follow up Plan: If further intervention is needed the care management team is available to follow up after a formal CCM referral is placed    Collaboration with Daisy Floro, DO regarding development and update of comprehensive plan of care as evidenced by provider attestation and co-signature Review of patient past medical history, allergies, medications, and health status, including review of pertinent consultant reports was performed as part of comprehensive evaluation and provision of care management/care coordination services.   Care Plan There are no care plans to display for this patient.    Maurine Cane, LCSW

## 2020-09-03 ENCOUNTER — Ambulatory Visit
Admission: RE | Admit: 2020-09-03 | Discharge: 2020-09-03 | Disposition: A | Discharge status: Home / Self Care | Payer: Medicare Other | Source: Ambulatory Visit | Attending: Emergency Medicine | Admitting: Emergency Medicine

## 2020-09-03 ENCOUNTER — Other Ambulatory Visit: Payer: Self-pay

## 2020-09-03 VITALS — BP 189/77 | HR 65 | Temp 98.2°F | Resp 18

## 2020-09-03 DIAGNOSIS — L539 Erythematous condition, unspecified: Secondary | ICD-10-CM | POA: Diagnosis not present

## 2020-09-03 DIAGNOSIS — W540XXA Bitten by dog, initial encounter: Secondary | ICD-10-CM | POA: Diagnosis not present

## 2020-09-03 DIAGNOSIS — S61432A Puncture wound without foreign body of left hand, initial encounter: Secondary | ICD-10-CM

## 2020-09-03 MED ORDER — AMOXICILLIN-POT CLAVULANATE 875-125 MG PO TABS: 1.0000 | ORAL_TABLET | Freq: Two times a day (BID) | ORAL | 0 refills | Status: DC

## 2020-09-03 NOTE — ED Provider Notes (Signed)
[place]   [id number] 09/03/20 Arrival Time: 1007   Chief Complaint  Patient presents with  . Animal Bite     SUBJECTIVE: History from: patient.  [name] is a 65 y.o. female who presented to the urgent care with a complaint of dog bite to left hand that occurred yesterday.  Denies any precipitating event.  Report dog rabies immunization are up-to-date.  Has tried OTC medication without relief.  Denies of any aggravating factor.  Denies similar symptoms in the past.  Denies chills, fever, nausea, vomiting, diarrhea, chest pain, shortness of breath.  Td immunization is up-to-date.  Last Tdap 09/17/2011  ROS: As per HPI.  All other pertinent ROS negative.     Past Medical History:  Diagnosis Date  . Advanced directives, counseling/discussion 04/12/2020  . Allergy   . Anemia   . ANEMIA, PERNICIOUS, HX OF 05/13/2007  . Anxiety   . Arthritis   . ASYMPTOMATIC POSTMENOPAUSAL STATUS 02/18/2008  . B12 deficiency 12/13/2016  . BACK PAIN, LUMBAR 11/19/2007  . Blood in urine 07/19/2019  . Bowel incontinence   . Cataract   . Cellulitis of left lower leg 10/28/2013  . Cellulitis of leg, right 10/11/2010  . Chronic depression 10/06/2007  . Chronic dermatitis of hands 05/02/2009  . Coronary artery disease involving native coronary artery of native heart without angina pectoris 07/07/2018   Last Assessment & Plan:  Formatting of this note might be different from the original. The patient reports remote cardiac catheterization with up to 40% plaque.  Subsequent Cardiolite stress test in October 2019 did not show any evidence of active ischemia or prior infarction. Formatting of this note might be different from the original. medical  Last Assessment & Plan:  Formatting of this note mi  . DEPRESSION, CHRONIC 10/06/2007  . Diabetes mellitus without complication ([place])   . DIABETES MELLITUS, WITH NEUROLOGICAL COMPLICATIONS 03/08/568  . Diabetic neuropathy ([place])   . DYSLIPIDEMIA  05/13/2007  . Dyslipidemia 04/15/2017   Last Assessment & Plan:  The patient will continue atorvastatin for her dyslipidemia.  . Dyslipidemia associated with type 2 diabetes mellitus ([place]) 07/11/2018  . Essential hypertension 03/20/2010  . Family history of colon cancer   . Fecal incontinence 08/02/2015  . Frozen shoulder    Lt  . Full dentures   . Gastroesophageal reflux disease without esophagitis 04/15/2017  . Generalized abdominal pain 02/05/2018  . Genetic testing 11/24/2017   Negative genetic testing on the common hereditary cancer panel.  The Hereditary Gene Panel offered by [facility] includes sequencing and/or deletion duplication testing of the following 47 genes: APC, ATM, AXIN2, BARD1, BMPR1A, BRCA1, BRCA2, BRIP1, CDH1, CDK4, CDKN2A (p14ARF), CDKN2A (p16INK4a), CHEK2, CTNNA1, DICER1, EPCAM (Deletion/duplication testing only), GREM1 (promoter region deletion/duplicat  . GERD (gastroesophageal reflux disease)   . GI bleed 03/15/2016  . GOITER, MULTINODULAR 05/13/2007  . GOITER, MULTINODULAR 05/13/2007    (last TSH 3.26; US soft tissue neck in 09/2004 showed multinodular goiter with specific small nodule for which was recommended to be followed by repeat US in 3-6 months 12/2016: Thyroid US: recommend repeat in 1 year (one nodule 1.7cm on left thyroid)   . Headache   . History of blood transfusion   . Hx of colonic polyps 12/04/2010  . HYPERCHOLESTEROLEMIA 03/20/2010  . Hyperparathyroidism ([place]) 04/11/2020  . HYPERTENSION 03/20/2010  . Hypertension associated with diabetes ([place]) 07/11/2018  . Hypothyroidism 07/19/2019  . Iron deficiency anemia 06/19/2012  . Low back pain 03/05/2012   Completed PT-notes say  very motivated and had good progress.   CT (03/2020: Mild curvature convex to the right in the upper lumbar region into left in lower lumbar region. No antero or retrolisthesis in the supine position. retrolisthesis at L3-4 of 3 mm with flexion which increases to 6 mm with neutral and extension.  Multifactorial spinal stenosis at L3-4 due to circumferential protrusion of the   . Lower back pain   . Major depression in remission ([place]) 07/07/2018   Last Assessment & Plan:  Mood has seemed good on sertraline.  . Malabsorption 03/12/2016  . NASH (nonalcoholic steatohepatitis)   . Nausea 07/19/2019  . Need for immunization against influenza 04/12/2020  . No-show for appointment 03/08/2020  . Nonobstructive CAD  08/26/2013   The patient has prior cardiac workup in 2006 when she underwent a stress test for abnormal EKG. The stress test was nonconclusive and she underwent cardiac catheterization that showed 30% stenosis in the LAD in 25% stenosis in RCA she was treated medically.    . OA (osteoarthritis)   . OBSTRUCTIVE SLEEP APNEA 06/12/2007   uses CPAP.  Marland Kitchen Pacemaker   . Paroxysmal atrial fibrillation ([place]) 05/11/2018   Last Assessment & Plan:  Patient was noted to have episodes of paroxysmal atrial fibrillation that was predominantly rate controlled and at a relatively low burden.  She is chronically on anticoagulation now with Eliquis.  She does not take any negative chronotropic agents with her sinus bradycardia.  If she has any breakthrough more prolonged tachycardic episodes and requires negative chronotropi  . Peripheral autonomic neuropathy due to diabetes mellitus ([place]) 07/11/2018  . PERIPHERAL NEUROPATHY 01/12/2009  . Peripheral neuropathy 01/12/2009  . Peripheral vascular disease ([place])   . Presbycusis of both ears 04/11/2020  . Primary osteoarthritis involving multiple joints 07/07/2018   Last Assessment & Plan:  Post op left tka and here for rehab as her husband is handicapped so she has limited help at home. Consitpation noted, some pain overnight, better with a pillow under her knee.  . Primary osteoarthritis of left knee 04/23/2011   Previously seen by Dr. Alphonzo Severance. Will plan on repeat referral after trial injection.     Marland Kitchen PSORIASIS 05/28/2010  . Rectal prolapse 05/24/2015  .  Rectovaginal fistula 05/25/2014  . Renal insufficiency    stage 1 kd  . S/P bariatric surgery-duodenal switch with sleeve gastrectomy 12/04/2013  . S/P total knee arthroplasty, right 11/16/2019  . Screening mammogram, encounter for 07/12/2019  . Second degree AV block 02/08/2020  . Serum calcium elevated 04/12/2020  . Short bowel syndrome 07/11/2018  . Spigelian hernia 02/05/2018  . Status post left knee replacement 07/11/2018  . Stress 05/24/2015  . Surgical counseling visit 10/02/2019  . Tubular adenoma of colon 03/09/2016   Colonoscopy in 10/2015: 3  tubular adenomas; GI recommends repeat colonoscopy in 3 years Colonoscopy August 2012: 3 tubular adenomas.   . Type 2 diabetes, controlled, with neuropathy ([place]) 02/18/2008  . Umbilical hernia without obstruction and without gangrene 04/15/2017  . Unilateral primary osteoarthritis, left knee   . UNSPECIFIED VENOUS INSUFFICIENCY 05/28/2010   Patient with frequent ulceration related to venous stasis-seen at wound care. Edema and Venous stasis changes due to this . Echo 04/16/11 with EF 60%. No signs diastolic dysfunction.       . Vaccination against Streptococcus pneumoniae within past 5 years 04/12/2020  . Vaginal irritation 07/19/2019  . Venous stasis dermatitis of right lower extremity 01/02/2018  . Vitamin D deficiency 03/09/2016  . Wears glasses  Past Surgical History:  Procedure Laterality Date  . CATARACT EXTRACTION W/ INTRAOCULAR LENS  IMPLANT, BILATERAL    . COLONOSCOPY    . ELECTROCARDIOGRAM  04/16/2006  . EXAMINATION UNDER ANESTHESIA N/A 06/29/2014   Procedure: EXAM UNDER ANESTHESIA;  Surgeon: Janyth Contes, MD;  Location: [facility];  Service: Gynecology;  Laterality: N/A;  . Exercise myoview  01/24/2005  . FLEXIBLE SIGMOIDOSCOPY    . GASTRIC BYPASS  11/09/2013  . GASTRIC BYPASS    . JOINT REPLACEMENT    . KNEE ARTHROSCOPY  2003   right  . LAPAROSCOPY N/A 03/12/2016   Procedure: LAPAROSCOPIC ANASTOMOSIS OF INTESTINE  (ENTEROENTEROSTOMY);  Surgeon: Ladora Daniel, MD;  Location: [facility];  Service: General;  Laterality: N/A;  . LEG SURGERY Left    metal and pins in lower left leg  . mrsa Right    arm  . MULTIPLE TOOTH EXTRACTIONS    . PACEMAKER LEADLESS INSERTION N/A 02/08/2020   Procedure: PACEMAKER LEADLESS INSERTION;  Surgeon: Thompson Grayer, MD;  Location: [facility];  Service: Cardiovascular;  Laterality: N/A;  . SHOULDER ARTHROSCOPY W/ ROTATOR CUFF REPAIR Right   . stab phlebectomy  Right 02/10/2019   stab phlebectomy > 20 incisions right leg by [name] MD   . TONSILLECTOMY    . TOTAL KNEE ARTHROPLASTY Left 06/30/2018   Procedure: LEFT TOTAL KNEE ARTHROPLASTY;  Surgeon: Meredith Pel, MD;  Location: [facility];  Service: Orthopedics;  Laterality: Left;  . TOTAL KNEE ARTHROPLASTY Right 11/16/2019  . TOTAL KNEE ARTHROPLASTY Right 11/16/2019   Procedure: RIGHT TOTAL KNEE ARTHROPLASTY-CEMENTED;  Surgeon: Meredith Pel, MD;  Location: [facility];  Service: Orthopedics;  Laterality: Right;  Marland Kitchen VARICOSE VEIN SURGERY     Remotef   Allergies  Allergen Reactions  . Cephalexin Hives and Rash    Denies Airway involvement  . Latex Rash  . Neomycin-Bacitracin Zn-Polymyx Rash  . Tape Rash    Latex Prefers paper tape   No current facility-administered medications on file prior to encounter.   Current Outpatient Medications on File Prior to Encounter  Medication Sig Dispense Refill  . acetaminophen (TYLENOL) 325 MG tablet Take 2 tablets (650 mg total) by mouth every 4 (four) hours as needed for headache or mild pain.    Marland Kitchen amLODipine (NORVASC) 10 MG tablet TAKE 1 TABLET BY MOUTH EVERY DAY (Patient taking differently: Take 10 mg by mouth daily.) 90 tablet 0  . apixaban (ELIQUIS) 5 MG TABS tablet Take 1 tablet (5 mg total) by mouth 2 (two) times daily. Resume with EVENING dose today. 60 tablet 0  . atorvastatin (LIPITOR) 40 MG tablet TAKE 1 TABLET BY MOUTH EVERY DAY (Patient taking differently:  Take 40 mg by mouth daily.) 90 tablet 0  . benzonatate (TESSALON) 100 MG capsule Take 2 capsules (200 mg total) by mouth 3 (three) times daily as needed for cough. 45 capsule 0  . celecoxib (CELEBREX) 200 MG capsule Take 1 capsule (200 mg total) by mouth 2 (two) times daily. 60 capsule 0  . Cholecalciferol (VITAMIN D) 125 MCG (5000 UT) CAPS Take 5,000 Units by mouth daily. 30 capsule 11  . ferrous sulfate 324 (65 Fe) MG TBEC TAKE 1 TABLET BY MOUTH EVERY OTHER DAY 180 tablet 1  . fluticasone (FLONASE) 50 MCG/ACT nasal spray Place 2 sprays into both nostrils daily. 16 g 2  . furosemide (LASIX) 40 MG tablet Take 1 tablet (40 mg total) by mouth daily. 90 tablet 3  . gabapentin (NEURONTIN)  300 MG capsule TAKE 1 CAPSULE BY MOUTH THREE TIMES A DAY 270 capsule 3  . glucose blood (ACCU-CHEK AVIVA PLUS) test strip Use to check sugar once daily in the morning 100 each 0  . lisinopril-hydrochlorothiazide (ZESTORETIC) 20-25 MG tablet Take 1 tablet by mouth daily. 90 tablet 3  . metFORMIN (GLUCOPHAGE) 1000 MG tablet Take 1 tablet (1,000 mg total) by mouth 2 (two) times daily with a meal. 90 tablet 2  . methocarbamol (ROBAXIN) 500 MG tablet Take 1 tablet (500 mg total) by mouth every 8 (eight) hours as needed for muscle spasms. 30 tablet 0  . methocarbamol (ROBAXIN) 500 MG tablet TAKE 1 TABLET BY MOUTH EVERY 6 HOURS AS NEEDED FOR MUSCLE SPASM (Patient taking differently: Take 500 mg by mouth every 6 (six) hours as needed for muscle spasms.) 120 tablet 3  . multivitamin-iron-minerals-folic acid (CENTRUM) chewable tablet Chew 1 tablet by mouth daily.     Marland Kitchen oxyCODONE (OXY IR/ROXICODONE) 5 MG immediate release tablet Take 1 tablet (5 mg total) by mouth every 4 (four) hours as needed for severe pain. 35 tablet 0  . sertraline (ZOLOFT) 100 MG tablet Take 1 tablet (100 mg total) by mouth daily. 90 tablet 1  . spironolactone (ALDACTONE) 25 MG tablet Take 1 tablet (25 mg total) by mouth daily. 90 tablet 3  . Teduglutide,  rDNA, 5 MG KIT Inject 5 mg into the skin daily.      Social History   Socioeconomic History  . Marital status: Married    Spouse name: Marya Amsler  . Number of children: 2  . Years of education: [employer]  . Highest education level: Not on file  Occupational History  . Occupation: Unemployed    Comment: disabled  Tobacco Use  . Smoking status: Former Smoker    Packs/day: 2.00    Years: 24.00    Pack years: 48.00    Types: Cigarettes    Start date: 06/24/1970    Quit date: 08/06/1994    Years since quitting: 26.0  . Smokeless tobacco: Never Used  Vaping Use  . Vaping Use: Never used  Substance and Sexual Activity  . Alcohol use: No    Alcohol/week: 0.0 standard drinks  . Drug use: No  . Sexual activity: Not Currently  Other Topics Concern  . Not on file  Social History Narrative   Married '91, husband has MS, wheelchair bound   [id number] x 2, 1 son '91 8.5lbs, 1 daughter '92 10lbs      Caffeine use- coffee 3-4 cups daily   Social Determinants of Health   Financial Resource Strain: Not on file  Food Insecurity: Not on file  Transportation Needs: Not on file  Physical Activity: Not on file  Stress: Not on file  Social Connections: Not on file  Intimate Partner Violence: Not on file   Family History  Problem Relation Age of Onset  . Hyperlipidemia Father   . Hypertension Father   . Cirrhosis Father   . Colon cancer Father 81       d. 17  . Colon cancer Sister 60       d. 13  . Bipolar disorder Sister   . Aneurysm Mother        brain  . Cerebral aneurysm Mother   . Colon cancer Sister 15       d. 40  . Bipolar disorder Sister   . Cancer Paternal Aunt        NOS, ? colon  .  Congestive Heart Failure Maternal Grandmother   . Drug abuse Neg Hx   . CAD Neg Hx   . Stomach cancer Neg Hx   . Hyperparathyroidism Neg Hx   . Hypercalcemia Neg Hx     OBJECTIVE:  Vitals:   09/03/20 1024  BP: (!) 189/77  Pulse: 65  Resp: 18  Temp: 98.2 F (36.8 C)  TempSrc: Oral   SpO2: 98%     Physical Exam Vitals and nursing note reviewed.  Constitutional:      General: She is not in acute distress.    Appearance: Normal appearance. She is normal weight. She is not ill-appearing, toxic-appearing or diaphoretic.  HENT:     Head: Normocephalic.  Cardiovascular:     Rate and Rhythm: Normal rate and regular rhythm.     Pulses: Normal pulses.     Heart sounds: Normal heart sounds. No murmur heard. No friction rub. No gallop.   Pulmonary:     Effort: Pulmonary effort is normal. No respiratory distress.     Breath sounds: Normal breath sounds. No stridor. No wheezing, rhonchi or rales.  Chest:     Chest wall: No tenderness.  Skin:    Findings: Erythema and wound present.     Comments: 2 Puncture wound present on left hand  Neurological:     Mental Status: She is alert and oriented to person, place, and time.      LABS:  No results found for this or any previous visit (from the past 24 hour(s)).   ASSESSMENT & PLAN:  1. Dog bite, initial encounter   2. Erythema   3. Puncture wound of left hand without foreign body, initial encounter     Meds ordered this encounter  Medications  . amoxicillin-clavulanate (AUGMENTIN) 875-125 MG tablet    Sig: Take 1 tablet by mouth every 12 (twelve) hours.    Dispense:  14 tablet    Refill:  0    Discharge instructions  Wash with warm water and mild soap Change dressing daily Augmentin prescribed.  Take as directed and to completion Follow up with PCP if symptoms persists Return or go to the ED if you have any new or worsening symptoms such as increasing redness, swelling, drainage, fever, chills, nausea, chest pain, SOB, etc...   Reviewed expectations re: course of current medical issues. Questions answered. Outlined signs and symptoms indicating need for more acute intervention. Patient verbalized understanding. After Visit Summary given.         Emerson Monte, FNP 09/03/20 1052

## 2020-09-03 NOTE — Discharge Instructions (Addendum)
Wash with warm water and mild soap Change dressing daily Augmentin prescribed.  Take as directed and to completion Follow up with PCP if symptoms persists Return or go to the ED if you have any new or worsening symptoms such as increasing redness, swelling, drainage, fever, chills, nausea, chest pain, SOB, etc..Marland Kitchen

## 2020-09-03 NOTE — ED Triage Notes (Signed)
Puncture wounds, swelling, redness and pain to LT hand from dog bite yesterday.  Dog is up to date on rabies.

## 2020-09-04 ENCOUNTER — Ambulatory Visit: Payer: Medicare Other | Admitting: Endocrinology

## 2020-09-07 ENCOUNTER — Other Ambulatory Visit: Payer: Self-pay

## 2020-09-07 ENCOUNTER — Ambulatory Visit (INDEPENDENT_AMBULATORY_CARE_PROVIDER_SITE_OTHER): Payer: Medicare Other | Admitting: Family Medicine

## 2020-09-07 VITALS — BP 142/60 | HR 80 | Ht 68.0 in | Wt 241.1 lb

## 2020-09-07 DIAGNOSIS — R4181 Age-related cognitive decline: Secondary | ICD-10-CM | POA: Diagnosis not present

## 2020-09-07 DIAGNOSIS — G4730 Sleep apnea, unspecified: Secondary | ICD-10-CM | POA: Diagnosis not present

## 2020-09-07 DIAGNOSIS — Z114 Encounter for screening for human immunodeficiency virus [HIV]: Secondary | ICD-10-CM | POA: Diagnosis not present

## 2020-09-07 DIAGNOSIS — E785 Hyperlipidemia, unspecified: Secondary | ICD-10-CM

## 2020-09-07 DIAGNOSIS — K912 Postsurgical malabsorption, not elsewhere classified: Secondary | ICD-10-CM | POA: Diagnosis not present

## 2020-09-07 DIAGNOSIS — E1169 Type 2 diabetes mellitus with other specified complication: Secondary | ICD-10-CM

## 2020-09-07 DIAGNOSIS — R413 Other amnesia: Secondary | ICD-10-CM | POA: Diagnosis not present

## 2020-09-07 DIAGNOSIS — E669 Obesity, unspecified: Secondary | ICD-10-CM

## 2020-09-07 NOTE — Progress Notes (Signed)
S:Pharmacy consulted to complete medication reconciliation for geriatric clinic. Patient arrives in good spirits.Medication reconciliation completed by patient.Patient was able to confirm medications. Patient brought medication prescription bottles with majority last filled March-July 2021.Medication adherence appears poor, with patient reporting that she takes her medications 3-4 times a week and does not use a pillbox.She reports not taking medications due to being her husband's caretaker. Additionally, patient reports that she has difficulty swallowing pills since her gastric bypass, especially the large ones including metformin and gabapentin. Reports not checking BP at home, and denies dizziness/headaches/cough. Reports mild swelling in right ankle. Patient received prescriptions of Augmentin in February 2022 for sinusitis, however did not complete 7 day antibiotic treatment. Then, was prescribed another 7 days of Augmentin in March 2022 for dog bite and did not complete.   Medication issues identified: 1. Celecoxib: NSAID use not recommended in patients aged 77 years or older  Plan: 1.Recommended discontinuing celecoxib 2. Recommended completing course of antibiotics  3. Counseled on the risk of stroke with Eliquis non-adherence and encouraged patient to take medications as prescribed  4. Recommended disposal of expired medications properly 5. Recommended avoidance of combining prescription bottles  6. Recommend increasing atorvastatin to 80 mg to reach LDL goal of <70 given history of CAD and no statin intolerance.   Inis Sizer, PharmD Candidate 2024 Lena Yanett Conkright, PharmD, BCPS PGY2 Ambulatory Care Resident [place]

## 2020-09-07 NOTE — Assessment & Plan Note (Signed)
She has borderline mild cognitive impairment with MOCA 26/30.  She has caregiver stress, but has no functional decline.  Discussed lifestyle interventions as well as using her CPAP for sleep apnea as this could contribute to memory loss.  Will obtain CMP, vitamin B12, lipid panel, thiamine, ferritin, CBC, HIV in the setting of this memory loss as well as known history of short bowel syndrome.  Patient is working to get more help at home with her caregiver stress and will try to implement these lifestyle interventions and wear her CPAP.  Follow-up with PCP regularly.

## 2020-09-07 NOTE — Progress Notes (Signed)
Provider:  Lissa Morales, MD Location:      Place of Service:     PCP: Daisy Floro, DO Patient Care Team: Daisy Floro, DO as PCP - General (Family Medicine) Kennith Center, RD as Dietitian (Family Medicine) Macarthur Critchley, [place] as Referring Physician (Optometry) Duke Salvia Venia Carbon, MD as Attending Physician [facility]) Marlou Sa, Tonna Corner, MD as Consulting Physician (Orthopedic Surgery) Lloyd Huger, MD as Consulting Physician (Oncology)  Extended Emergency Contact Information Primary Emergency Contact: Venancio Poisson of [place] Phone: [phone] Mobile Phone: [phone] Relation: Daughter Secondary Emergency Contact: Crichlow,Greg Address: [address]          [place], [place] [address] [name] of [place] Phone: [phone] Relation: Spouse  Code Status: DNR Goals of Care: Advanced Directive information Advanced Directives 09/07/2020  Does Patient Have a Medical Advance Directive? No  Would patient like information on creating a medical advance directive? No - Patient declined  Pre-existing out of facility DNR order (yellow form or pink MOST form) -     [facility]:   Patient is alone Primary caregiver:  patient Patient's Currently living arrangement:  patient and spouse Patient information was obtained from ER records, historical medical records and office notes  patient. History/Exam limitations:  none Primary Care Provider:   Daisy Floro, DO Referring provider:  Daisy Floro, DO Reason for referral:  Memory Loss  ----------------------------------------------------------------------------------------------------------------------------------------------------------------------------------------------------------------------------------------------------------------   HPI by problems:  Chief Complaint  Patient presents with  . Memory Loss    Cognitive impairment  concern  Are there problems with thinking?  Cognition domains: Memory difficulties, thinking about a lot, feels like "there is a hamster on a wheel in my head"  When were the changes first noticed?  1year  Did this change occur abruptly or gradually?  gradual  How have the changes progressed since then?  gradually worsening  Has there been any tremors or abnormal movements?  Yes, right hand resting tremor  Have they had in hallucinations or delusions:  no     Compared to 5 to 10 years ago, how is the patient at:  Problems with Judgment, e.g., problem making decisions, bad financial decisions, problems with thinking?  no  Less interested in hobbies or previously enjoyed activities?  no  Problem remembering things about family and friends e.g. names,  occupations, birthdays, addresses?  no  Problem remembering conversations or news events a few days later?  no  Problem remembering what day and month it is? no  Problem with losing things?  yes, keys only  Problem learning to use a new gadget or machine around the house, e.g., cell phones, computer, microwave, remote control?  no  Problem with handling money for shopping?  no  Problem handling financial matters, e.g. their pension, checking, credit cards, dealing with the bank?  no  Problem with getting lost in familiar places?  no   Geriatric Depression Scale:  6 / 15   Outpatient Encounter Medications as of 09/07/2020  Medication Sig  . amLODipine (NORVASC) 10 MG tablet TAKE 1 TABLET BY MOUTH EVERY DAY (Patient taking differently: Take 10 mg by mouth daily.)  . amoxicillin-clavulanate (AUGMENTIN) 875-125 MG tablet Take 1 tablet by mouth every 12 (twelve) hours.  Marland Kitchen apixaban (ELIQUIS) 5 MG TABS tablet Take 1 tablet (5 mg total) by mouth 2 (two) times daily. Resume with EVENING dose today.  Marland Kitchen atorvastatin (LIPITOR) 40 MG tablet TAKE 1 TABLET BY MOUTH EVERY DAY (Patient  taking differently: Take 40 mg by mouth  daily.)  . fluticasone (FLONASE) 50 MCG/ACT nasal spray Place 2 sprays into both nostrils daily.  . furosemide (LASIX) 40 MG tablet Take 1 tablet (40 mg total) by mouth daily.  Marland Kitchen gabapentin (NEURONTIN) 100 MG capsule Take 100 mg by mouth 3 (three) times daily.  Marland Kitchen gabapentin (NEURONTIN) 300 MG capsule TAKE 1 CAPSULE BY MOUTH THREE TIMES A DAY  . glucose blood (ACCU-CHEK AVIVA PLUS) test strip Use to check sugar once daily in the morning  . lisinopril-hydrochlorothiazide (ZESTORETIC) 20-25 MG tablet Take 1 tablet by mouth daily.  . metFORMIN (GLUCOPHAGE) 1000 MG tablet Take 1 tablet (1,000 mg total) by mouth 2 (two) times daily with a meal.  . methocarbamol (ROBAXIN) 500 MG tablet TAKE 1 TABLET BY MOUTH EVERY 6 HOURS AS NEEDED FOR MUSCLE SPASM (Patient taking differently: Take 500 mg by mouth every 6 (six) hours as needed for muscle spasms.)  . sertraline (ZOLOFT) 100 MG tablet Take 1 tablet (100 mg total) by mouth daily.  Marland Kitchen spironolactone (ALDACTONE) 25 MG tablet Take 1 tablet (25 mg total) by mouth daily.  . Teduglutide, rDNA, 5 MG KIT Inject 5 mg into the skin daily.   . Cholecalciferol (VITAMIN D) 125 MCG (5000 UT) CAPS Take 5,000 Units by mouth daily. (Patient not taking: Reported on 09/07/2020)  . ferrous sulfate 324 (65 Fe) MG TBEC TAKE 1 TABLET BY MOUTH EVERY OTHER DAY (Patient not taking: Reported on 09/07/2020)  . multivitamin-iron-minerals-folic acid (CENTRUM) chewable tablet Chew 1 tablet by mouth daily.  (Patient not taking: Reported on 09/07/2020)  . [DISCONTINUED] acetaminophen (TYLENOL) 325 MG tablet Take 2 tablets (650 mg total) by mouth every 4 (four) hours as needed for headache or mild pain. (Patient not taking: Reported on 09/07/2020)  . [DISCONTINUED] benzonatate (TESSALON) 100 MG capsule Take 2 capsules (200 mg total) by mouth 3 (three) times daily as needed for cough. (Patient not taking: Reported on 09/07/2020)  . [DISCONTINUED] celecoxib (CELEBREX) 200 MG capsule Take 1 capsule  (200 mg total) by mouth 2 (two) times daily. (Patient not taking: Reported on 09/07/2020)  . [DISCONTINUED] methocarbamol (ROBAXIN) 500 MG tablet Take 1 tablet (500 mg total) by mouth every 8 (eight) hours as needed for muscle spasms.  . [DISCONTINUED] oxyCODONE (OXY IR/ROXICODONE) 5 MG immediate release tablet Take 1 tablet (5 mg total) by mouth every 4 (four) hours as needed for severe pain.   No facility-administered encounter medications on file as of 09/07/2020.    History Patient Active Problem List   Diagnosis Date Noted  . Memory loss 08/07/2020  . Serum calcium elevated 04/12/2020  . Hallux rigidus of left foot 04/12/2020  . Hyperparathyroidism ([place]) 04/11/2020  . Presbycusis of both ears 04/11/2020  . Pacemaker   . Hypothyroidism 07/19/2019  . Hypertension associated with diabetes ([place]) 07/11/2018  . Dyslipidemia associated with type 2 diabetes mellitus ([place]) 07/11/2018  . Short bowel syndrome 07/11/2018  . Status post left knee replacement 07/11/2018  . Major depression in remission ([place]) 07/07/2018  . Coronary artery disease involving native coronary artery of native heart without angina pectoris 07/07/2018  . Paroxysmal atrial fibrillation ([place]) 05/11/2018  . Mild cognitive impairment 04/30/2018  . Gastroesophageal reflux disease without esophagitis 04/15/2017  . B12 deficiency 12/13/2016  . Tubular adenoma of colon 03/09/2016  . Vitamin D deficiency 03/09/2016  . S/P bariatric surgery-duodenal switch with sleeve gastrectomy 12/04/2013  . Morbid obesity ([place]) 08/26/2013  . Iron deficiency anemia 06/19/2012  .  Low back pain 03/05/2012  . PSORIASIS 05/28/2010  . Peripheral neuropathy 01/12/2009  . Type 2 diabetes, controlled, with neuropathy ([place]) 02/18/2008  . Obesity with sleep apnea 06/12/2007   Past Medical History:  Diagnosis Date  . Advanced directives, counseling/discussion 04/12/2020  . Allergy   . Anemia   . ANEMIA, PERNICIOUS, HX OF 05/13/2007  . Anxiety    . Arthritis   . ASYMPTOMATIC POSTMENOPAUSAL STATUS 02/18/2008  . B12 deficiency 12/13/2016  . BACK PAIN, LUMBAR 11/19/2007  . Blood in urine 07/19/2019  . Bowel incontinence   . Cataract   . Cellulitis of left lower leg 10/28/2013  . Cellulitis of leg, right 10/11/2010  . Chronic depression 10/06/2007  . Chronic dermatitis of hands 05/02/2009  . Coronary artery disease involving native coronary artery of native heart without angina pectoris 07/07/2018   Last Assessment & Plan:  Formatting of this note might be different from the original. The patient reports remote cardiac catheterization with up to 40% plaque.  Subsequent Cardiolite stress test in October 2019 did not show any evidence of active ischemia or prior infarction. Formatting of this note might be different from the original. medical  Last Assessment & Plan:  Formatting of this note mi  . DEPRESSION, CHRONIC 10/06/2007  . Diabetes mellitus without complication ([place])   . DIABETES MELLITUS, WITH NEUROLOGICAL COMPLICATIONS 6/46/8032  . Diabetic neuropathy ([place])   . DYSLIPIDEMIA 05/13/2007  . Dyslipidemia 04/15/2017   Last Assessment & Plan:  The patient will continue atorvastatin for her dyslipidemia.  . Dyslipidemia associated with type 2 diabetes mellitus ([place]) 07/11/2018  . Essential hypertension 03/20/2010  . Family history of colon cancer   . Fecal incontinence 08/02/2015  . Frozen shoulder    Lt  . Full dentures   . Gastroesophageal reflux disease without esophagitis 04/15/2017  . Generalized abdominal pain 02/05/2018  . Genetic testing 11/24/2017   Negative genetic testing on the common hereditary cancer panel.  The Hereditary Gene Panel offered by [facility] includes sequencing and/or deletion duplication testing of the following 47 genes: APC, ATM, AXIN2, BARD1, BMPR1A, BRCA1, BRCA2, BRIP1, CDH1, CDK4, CDKN2A (p14ARF), CDKN2A (p16INK4a), CHEK2, CTNNA1, DICER1, EPCAM (Deletion/duplication testing only), GREM1 (promoter region  deletion/duplicat  . GERD (gastroesophageal reflux disease)   . GI bleed 03/15/2016  . GOITER, MULTINODULAR 05/13/2007  . GOITER, MULTINODULAR 05/13/2007    (last TSH 3.26; US soft tissue neck in 09/2004 showed multinodular goiter with specific small nodule for which was recommended to be followed by repeat US in 3-6 months 12/2016: Thyroid US: recommend repeat in 1 year (one nodule 1.7cm on left thyroid)   . Headache   . History of blood transfusion   . Hx of colonic polyps 12/04/2010  . HYPERCHOLESTEROLEMIA 03/20/2010  . Hyperparathyroidism ([place]) 04/11/2020  . HYPERTENSION 03/20/2010  . Hypertension associated with diabetes ([place]) 07/11/2018  . Hypothyroidism 07/19/2019  . Iron deficiency anemia 06/19/2012  . Low back pain 03/05/2012   Completed PT-notes say very motivated and had good progress.   CT (03/2020: Mild curvature convex to the right in the upper lumbar region into left in lower lumbar region. No antero or retrolisthesis in the supine position. retrolisthesis at L3-4 of 3 mm with flexion which increases to 6 mm with neutral and extension. Multifactorial spinal stenosis at L3-4 due to circumferential protrusion of the   . Lower back pain   . Major depression in remission ([place]) 07/07/2018   Last Assessment & Plan:  Mood has seemed good on sertraline.  Marland Kitchen  Malabsorption 03/12/2016  . NASH (nonalcoholic steatohepatitis)   . Nausea 07/19/2019  . Need for immunization against influenza 04/12/2020  . No-show for appointment 03/08/2020  . Nonobstructive CAD  08/26/2013   The patient has prior cardiac workup in 2006 when she underwent a stress test for abnormal EKG. The stress test was nonconclusive and she underwent cardiac catheterization that showed 30% stenosis in the LAD in 25% stenosis in RCA she was treated medically.    . OA (osteoarthritis)   . OBSTRUCTIVE SLEEP APNEA 06/12/2007   uses CPAP.  Marland Kitchen Pacemaker   . Paroxysmal atrial fibrillation ([place]) 05/11/2018   Last Assessment & Plan:  Patient  was noted to have episodes of paroxysmal atrial fibrillation that was predominantly rate controlled and at a relatively low burden.  She is chronically on anticoagulation now with Eliquis.  She does not take any negative chronotropic agents with her sinus bradycardia.  If she has any breakthrough more prolonged tachycardic episodes and requires negative chronotropi  . Peripheral autonomic neuropathy due to diabetes mellitus ([place]) 07/11/2018  . PERIPHERAL NEUROPATHY 01/12/2009  . Peripheral neuropathy 01/12/2009  . Peripheral vascular disease ([place])   . Presbycusis of both ears 04/11/2020  . Primary osteoarthritis involving multiple joints 07/07/2018   Last Assessment & Plan:  Post op left tka and here for rehab as her husband is handicapped so she has limited help at home. Consitpation noted, some pain overnight, better with a pillow under her knee.  . Primary osteoarthritis of left knee 04/23/2011   Previously seen by Dr. Alphonzo Severance. Will plan on repeat referral after trial injection.     Marland Kitchen PSORIASIS 05/28/2010  . Rectal prolapse 05/24/2015  . Rectovaginal fistula 05/25/2014  . Renal insufficiency    stage 1 kd  . S/P bariatric surgery-duodenal switch with sleeve gastrectomy 12/04/2013  . S/P total knee arthroplasty, right 11/16/2019  . Screening mammogram, encounter for 07/12/2019  . Second degree AV block 02/08/2020  . Serum calcium elevated 04/12/2020  . Short bowel syndrome 07/11/2018  . Spigelian hernia 02/05/2018  . Status post left knee replacement 07/11/2018  . Stress 05/24/2015  . Surgical counseling visit 10/02/2019  . Tubular adenoma of colon 03/09/2016   Colonoscopy in 10/2015: 3  tubular adenomas; GI recommends repeat colonoscopy in 3 years Colonoscopy August 2012: 3 tubular adenomas.   . Type 2 diabetes, controlled, with neuropathy ([place]) 02/18/2008  . Umbilical hernia without obstruction and without gangrene 04/15/2017  . Unilateral primary osteoarthritis, left knee   . UNSPECIFIED VENOUS  INSUFFICIENCY 05/28/2010   Patient with frequent ulceration related to venous stasis-seen at wound care. Edema and Venous stasis changes due to this . Echo 04/16/11 with EF 60%. No signs diastolic dysfunction.       . Vaccination against Streptococcus pneumoniae within past 5 years 04/12/2020  . Vaginal irritation 07/19/2019  . Venous stasis dermatitis of right lower extremity 01/02/2018  . Vitamin D deficiency 03/09/2016  . Wears glasses    Past Surgical History:  Procedure Laterality Date  . CATARACT EXTRACTION W/ INTRAOCULAR LENS  IMPLANT, BILATERAL    . COLONOSCOPY    . ELECTROCARDIOGRAM  04/16/2006  . EXAMINATION UNDER ANESTHESIA N/A 06/29/2014   Procedure: EXAM UNDER ANESTHESIA;  Surgeon: Janyth Contes, MD;  Location: [facility];  Service: Gynecology;  Laterality: N/A;  . Exercise myoview  01/24/2005  . FLEXIBLE SIGMOIDOSCOPY    . GASTRIC BYPASS  11/09/2013  . GASTRIC BYPASS    . JOINT REPLACEMENT    . KNEE  ARTHROSCOPY  2003   right  . LAPAROSCOPY N/A 03/12/2016   Procedure: LAPAROSCOPIC ANASTOMOSIS OF INTESTINE (ENTEROENTEROSTOMY);  Surgeon: Ladora Daniel, MD;  Location: [facility];  Service: General;  Laterality: N/A;  . LEG SURGERY Left    metal and pins in lower left leg  . mrsa Right    arm  . MULTIPLE TOOTH EXTRACTIONS    . PACEMAKER LEADLESS INSERTION N/A 02/08/2020   Procedure: PACEMAKER LEADLESS INSERTION;  Surgeon: Thompson Grayer, MD;  Location: [facility];  Service: Cardiovascular;  Laterality: N/A;  . SHOULDER ARTHROSCOPY W/ ROTATOR CUFF REPAIR Right   . stab phlebectomy  Right 02/10/2019   stab phlebectomy > 20 incisions right leg by [name] MD   . TONSILLECTOMY    . TOTAL KNEE ARTHROPLASTY Left 06/30/2018   Procedure: LEFT TOTAL KNEE ARTHROPLASTY;  Surgeon: Meredith Pel, MD;  Location: [facility];  Service: Orthopedics;  Laterality: Left;  . TOTAL KNEE ARTHROPLASTY Right 11/16/2019   Procedure: RIGHT TOTAL KNEE ARTHROPLASTY-CEMENTED;  Surgeon: Meredith Pel, MD;  Location: [facility];  Service: Orthopedics;  Laterality: Right;  Marland Kitchen VARICOSE VEIN SURGERY     Remotef   Family History  Problem Relation Age of Onset  . Hyperlipidemia Father   . Hypertension Father   . Cirrhosis Father   . Colon cancer Father 25       d. 80  . Colon cancer Sister 23       d. 29  . Bipolar disorder Sister   . Aneurysm Mother        brain  . Cerebral aneurysm Mother   . Colon cancer Sister 81       d. 39  . Bipolar disorder Sister   . Cancer Paternal Aunt        NOS, ? colon  . Congestive Heart Failure Maternal Grandmother   . Drug abuse Neg Hx   . CAD Neg Hx   . Stomach cancer Neg Hx   . Hyperparathyroidism Neg Hx   . Hypercalcemia Neg Hx    Social History   Socioeconomic History  . Marital status: Married    Spouse name: Marya Amsler  . Number of children: 2  . Years of education: Not on file  . Highest education level: GED or equivalent  Occupational History  . Occupation: Unemployed    Comment: disabled  Tobacco Use  . Smoking status: Former Smoker    Packs/day: 2.00    Years: 24.00    Pack years: 48.00    Types: Cigarettes    Start date: 06/24/1970    Quit date: 08/06/1994    Years since quitting: 26.1  . Smokeless tobacco: Never Used  Vaping Use  . Vaping Use: Never used  Substance and Sexual Activity  . Alcohol use: No    Alcohol/week: 0.0 standard drinks  . Drug use: No  . Sexual activity: Not Currently  Other Topics Concern  . Not on file  Social History Narrative   Married '91, husband has MS, wheelchair bound   [id number] x 2, 1 son '91 8.5lbs, 1 daughter '92 10lbs      Caffeine use- coffee 3-4 cups daily   Social Determinants of Health   Financial Resource Strain: Not on file  Food Insecurity: Not on file  Transportation Needs: Not on file  Physical Activity: Not on file  Stress: Not on file  Social Connections: Not on file      Cardiovascular Risk Factors: Hypertension, IDDM Type 2  and Obesity, CAD with  pacemaker  Educational History: 13 years formal education Personal History of Seizures: No  Personal History of Stroke: No Personal History of Head Trauma: No Personal History of Psychiatric Disorders: No  Family History of Dementia: No    Basic Activities of Daily Living  Dressing: Self-care Eating: Self-care Ambulation: Self-care Toileting: Self-care Bathing: Self-care  Instrumental Activities of Daily Living Shopping: Self-care House/Yard Work: Self-care Administration of medications: Self-care Finances: Self-care, she splits with husband as they have always done this Telephone: Self-care Transportation: Self-care  Mobility Assist Devices: No assist devices  Caregivers in home: patient, she is the caregiver for her husband  Caregiver Stress Self-Assessment (Zarit Score):  13 out of 80 Scoring: 0-20 = Little/No stress       21-40 = Mild/Moderate stress       41-60 = Moderate/Severe stress      61-80 = Severe stress   Formal [place] [facility] / Personal Care Service: yes, but for her husband     FALLS in last five office visits:  Fall Risk  09/07/2020 04/03/2020 04/03/2020 10/29/2019 07/12/2019  Falls in the past year? 1 1 0 0 0  Number falls in past yr: 0 0 0 - 0  Injury with Fall? 1 - - - 0  Risk Factor Category  - - - - -  Risk for fall due to : - - - - -  Follow up - - - - -  Comment - - - - -    Health Maintenance reviewed: Immunization History  Administered Date(s) Administered  . Influenza Split 04/23/2011, 04/03/2012  . Influenza,inj,Quad PF,6+ Mos 03/01/2013, 04/14/2014, 07/25/2015, 02/20/2016, 02/25/2017, 03/04/2018, 02/19/2019, 04/04/2020  . Moderna Sars-Covid-2 Vaccination 09/04/2019, 10/06/2019, 04/08/2020  . Pneumococcal Conjugate-13 04/03/2020  . Pneumococcal Polysaccharide-23 07/12/2010, 11/17/2015  . Td 01/18/2005  . Tdap 09/17/2011   Health Maintenance Topics with due status: Overdue     Topic Date Due   FOOT EXAM  12/26/2017   DEXA SCAN 09/10/2019   PAP SMEAR-Modifier 11/23/2019     Geriatric Syndromes: Constipation no Dizziness no   Syncope no  Balance impairment:no    Skin problems yes , dry hands Visual Impairment yes, glasses, macular drusen Hearing impairment yes Dentures problems: no, but has dentures Sleep problems yes, goes to bed late, stays up and watches TV Weight loss no Drug Misadventure: no    Vital Signs Weight: 241 lb 2 oz (109.4 kg) Body mass index is 36.66 kg/m. CrCl cannot be calculated (Patient's most recent lab result is older than the maximum 21 days allowed.). Body surface area is 2.29 meters squared. Vitals:   09/07/20 1346  BP: (!) 142/60  Pulse: 80  SpO2: 96%  Weight: 241 lb 2 oz (109.4 kg)  Height: 5' 8"  (1.727 m)   Wt Readings from Last 3 Encounters:  09/07/20 241 lb 2 oz (109.4 kg)  08/02/20 244 lb 9.6 oz (110.9 kg)  05/30/20 239 lb (108.4 kg)   No exam data present  Physical Examination:  VS reviewed GEN: Alert, Cooperative, Groomed, NAD LUNGS: No Acc mm use, speaking in full sentences EXT: No peripheral leg edema. Feet without deformity or lesions SKIN: dry patches of skin on knuckles Neuro: Oriented to person, place, and time Psych: Normal affect/thought/speech/language   Mini-Mental State Examination or Montreal Cognitive Assessment:  Patient did not require additional cues or prompts to complete tasks. Patient was cooperative and  attentive to testing tasks Patient did  appear motivated to perform well  MOCA score 26/30  6CIT Screen 11/10/2018  What Year? 0 points  What month? 0 points  What time? 0 points  Count back from 20 0 points  Months in reverse 0 points  Repeat phrase 0 points  Total Score 0    No flowsheet data found.      No flowsheet data found.   Labs No components found for: [facility]  Lab Results  Component Value Date   VITAMINB12 364 04/29/2018    Lab Results  Component Value Date   FOLATE 7.8  05/07/2017    Lab Results  Component Value Date   TSH 2.73 05/12/2020    No results found for: RPR  Lab Results  Component Value Date   HIV NON REACTIVE 04/30/2013      Chemistry      Component Value Date/Time   NA 141 04/03/2020 1708   NA 138 11/10/2013 0526   K 4.8 04/03/2020 1708   K 4.0 11/10/2013 0526   CL 104 04/03/2020 1708   CL 106 11/10/2013 0526   CO2 25 04/03/2020 1708   CO2 28 11/10/2013 0526   BUN 20 04/03/2020 1708   BUN 14 11/10/2013 0526   CREATININE 1.04 (H) 04/03/2020 1708   CREATININE 0.70 03/08/2016 1530      Component Value Date/Time   CALCIUM 11.1 (H) 04/03/2020 1708   CALCIUM 9.6 05/25/2015 1551   ALKPHOS 166 (H) 04/03/2020 1708   ALKPHOS 117 (H) 05/07/2017 0000   AST 12 04/03/2020 1708   AST 15 11/08/2013 1618   ALT 9 04/03/2020 1708   ALT 8 05/07/2017 0000   BILITOT 0.2 04/03/2020 1708   BILITOT 0.4 11/08/2013 1618       CrCl cannot be calculated (Patient's most recent lab result is older than the maximum 21 days allowed.).   Lab Results  Component Value Date   HGBA1C 7.9 (A) 08/01/2020     @10RELATIVEDAYS @No  exam data present Lab Results  Component Value Date   WBC 6.1 02/10/2020   HGB 12.6 02/10/2020   HCT 40.3 02/10/2020   MCV 83.6 02/10/2020   PLT 197 02/10/2020    No results found for this or any previous visit (from the past 24 hour(s)).  Imaging Head CT: 09/17/2011, no acute abnormality  Brain MRI: 12/25/2007, small vessel atherosclerosis changes   Personal Strengths Ability for insight Active sense of humor Communication skills Supportive family/friends  Support System Strengths Supportive Relationships, Family and Friends    ------------------------------------------------------------------------------------------------------------------------------------------------------------------------------------------------------------------------------------------------------------------------------------------------------------------------------------------------------------------------------------------------------------------------------------------------------------------------------------------------------------  Assessment and Plan: Please see individual consultation notes from physical therapy, pharmacy and social work for today.   [name] has borderline mild cognitive impairment.  Discussed lifestyle interventions.  She does have some caregiver stress and is working to get help.   Problem List Items Addressed This Visit      Respiratory   Obesity with sleep apnea     Digestive   Short bowel syndrome   Relevant Orders   CMP14+EGFR   Vitamin B12   Lipid panel   Vitamin B1   Ferritin   CBC     Endocrine   Dyslipidemia associated with type 2 diabetes mellitus (HCC)   Relevant Orders   Lipid panel     Other   Memory loss - Primary    She has borderline mild cognitive impairment with MOCA 26/30.  She has caregiver stress, but has no functional decline.  Discussed lifestyle interventions as well  as using her CPAP for sleep apnea as this could contribute to memory loss.  Will obtain CMP, vitamin B12, lipid panel, thiamine, ferritin, CBC, HIV in the setting of this memory loss as well as known history of short bowel syndrome.  Patient is working to get more help at home with her caregiver stress and will try to implement these lifestyle interventions and wear her CPAP.  Follow-up with PCP regularly.      Relevant Orders   CMP14+EGFR   Vitamin B12    Other Visit Diagnoses    Screening for HIV (human immunodeficiency virus)       Relevant Orders   HIV antibody (with reflex)     Memory loss She has  borderline mild cognitive impairment with [place] 26/30.  She has caregiver stress, but has no functional decline.  Discussed lifestyle interventions as well as using her CPAP for sleep apnea as this could contribute to memory loss.  Will obtain CMP, vitamin B12, lipid panel, thiamine, ferritin, CBC, HIV in the setting of this memory loss as well as known history of short bowel syndrome.  Patient is working to get more help at home with her caregiver stress and will try to implement these lifestyle interventions and wear her CPAP.  Follow-up with PCP regularly.    Patient to Follow up with PCP  > 60 minutes face to face were spent in total with interdisciplinary discussion, patient and caretaker counseling and coordination of care took more than 20 minutes. The Geriatric interdisciplinary team meet to discuss the patient's assessment, problem list, and recommendations.  The interdisciplinary team consisted of representatives from medicine, pharmacy, physical therapy and social work. The interdisciplinary team meet with the patient and caretakers to review the team's findings, assessments, and recommendations.

## 2020-09-08 ENCOUNTER — Encounter: Payer: Self-pay | Admitting: Family Medicine

## 2020-09-08 NOTE — Progress Notes (Addendum)
I have interviewed and examined the patient with Dr Sandi Carne.  I have discussed the case with Dr. Sandi Carne.   I agree with their documentation and management in their consultation note except that in further reflection on her cognitive performance on the MoCA, I believe [name] has age-related cognitive decline rather than Mild Cognitive Impairment. [name] Cognitive Assessment  09/11/2020  Visuospatial/ Executive (0/5) 5  Naming (0/3) 2  Attention: Read list of digits (0/2) 2  Attention: Read list of letters (0/1) 1  Attention: Serial 7 subtraction starting at 100 (0/3) 3  Language: Repeat phrase (0/2) 1  Language : Fluency (0/1) 1  Abstraction (0/2) 1  Delayed Recall (0/5) 4  Orientation (0/6) 6  Total 26  Adjusted Score (based on education) 27   A/P Age-related cognitive decline [name] Rubalcava shows expected age-related cognitive decline with MoCA score within 2 SD of mean and preservation of her iADLs and ADLs. We discussed use of diet, exercise and sociality in preserving memory and cognition.  Should there be a significant change in her cognition, no further testing is required.

## 2020-09-11 NOTE — Assessment & Plan Note (Signed)
[name] shows expected age-related cognitive decline with MoCA score within 2 SD of mean and preservation of her iADLs and ADLs. We discussed use of diet, exercise and sociality in preserving memory and cognition.  Should there be a significant change in her cognition, no further testing is required.

## 2020-09-15 ENCOUNTER — Other Ambulatory Visit: Payer: Self-pay | Admitting: Family Medicine

## 2020-09-18 ENCOUNTER — Other Ambulatory Visit: Payer: Self-pay | Admitting: Family Medicine

## 2020-09-18 DIAGNOSIS — E7800 Pure hypercholesterolemia, unspecified: Secondary | ICD-10-CM

## 2020-09-18 LAB — CMP14+EGFR
ALT: 7 IU/L (ref 0–32)
AST: 16 IU/L (ref 0–40)
Albumin/Globulin Ratio: 1.5 (ref 1.2–2.2)
Albumin: 4.5 g/dL (ref 3.8–4.8)
Alkaline Phosphatase: 159 IU/L — ABNORMAL HIGH (ref 44–121)
BUN/Creatinine Ratio: 23 (ref 12–28)
BUN: 21 mg/dL (ref 8–27)
Bilirubin Total: 0.3 mg/dL (ref 0.0–1.2)
CO2: 19 mmol/L — ABNORMAL LOW (ref 20–29)
Calcium: 11.7 mg/dL — ABNORMAL HIGH (ref 8.7–10.3)
Chloride: 104 mmol/L (ref 96–106)
Creatinine, Ser: 0.92 mg/dL (ref 0.57–1.00)
Globulin, Total: 3 g/dL (ref 1.5–4.5)
Glucose: 121 mg/dL — ABNORMAL HIGH (ref 65–99)
Potassium: 5 mmol/L (ref 3.5–5.2)
Sodium: 144 mmol/L (ref 134–144)
Total Protein: 7.5 g/dL (ref 6.0–8.5)
eGFR: 69 mL/min/{1.73_m2} (ref >59)

## 2020-09-18 LAB — VITAMIN B1: Thiamine: 130.1 nmol/L (ref 66.5–200.0)

## 2020-09-18 LAB — CBC
Hematocrit: 41.6 % (ref 34.0–46.6)
Hemoglobin: 13.4 g/dL (ref 11.1–15.9)
MCH: 26.3 pg — ABNORMAL LOW (ref 26.6–33.0)
MCHC: 32.2 g/dL (ref 31.5–35.7)
MCV: 82 fL (ref 79–97)
Platelets: 254 10*3/uL (ref 150–450)
RBC: 5.09 x10E6/uL (ref 3.77–5.28)
RDW: 13.5 % (ref 11.7–15.4)
WBC: 5.6 10*3/uL (ref 3.4–10.8)

## 2020-09-18 LAB — VITAMIN B12: Vitamin B-12: 281 pg/mL (ref 232–1245)

## 2020-09-18 LAB — FERRITIN: Ferritin: 134 ng/mL (ref 15–150)

## 2020-09-18 LAB — LIPID PANEL
Chol/HDL Ratio: 4.2 ratio (ref 0.0–4.4)
Cholesterol, Total: 212 mg/dL — ABNORMAL HIGH (ref 100–199)
HDL: 50 mg/dL (ref >39)
LDL Chol Calc (NIH): 132 mg/dL — ABNORMAL HIGH (ref 0–99)
Triglycerides: 169 mg/dL — ABNORMAL HIGH (ref 0–149)
VLDL Cholesterol Cal: 30 mg/dL (ref 5–40)

## 2020-09-18 LAB — HIV ANTIBODY (ROUTINE TESTING W REFLEX): HIV Screen 4th Generation wRfx: NONREACTIVE

## 2020-09-18 MED ORDER — ATORVASTATIN CALCIUM 40 MG PO TABS: 40.0000 mg | ORAL_TABLET | Freq: Every day | ORAL | 3 refills | Status: DC

## 2020-09-22 ENCOUNTER — Other Ambulatory Visit: Payer: Self-pay | Admitting: Family Medicine

## 2020-10-16 ENCOUNTER — Other Ambulatory Visit: Payer: Self-pay

## 2020-10-16 MED ORDER — SPIRONOLACTONE 25 MG PO TABS
25.0000 mg | ORAL_TABLET | Freq: Every day | ORAL | 3 refills | Status: DC
Start: 2020-10-16 — End: 2021-02-08

## 2020-10-16 MED ORDER — SERTRALINE HCL 100 MG PO TABS
100.0000 mg | ORAL_TABLET | Freq: Every day | ORAL | 1 refills | Status: DC
Start: 2020-10-16 — End: 2021-11-01

## 2020-10-16 MED ORDER — METHOCARBAMOL 500 MG PO TABS: ORAL_TABLET | ORAL | 3 refills | Status: DC

## 2020-10-17 ENCOUNTER — Other Ambulatory Visit: Payer: Self-pay

## 2020-10-17 DIAGNOSIS — E7800 Pure hypercholesterolemia, unspecified: Secondary | ICD-10-CM

## 2020-10-18 MED ORDER — AMLODIPINE BESYLATE 10 MG PO TABS: 10.0000 mg | ORAL_TABLET | Freq: Every day | ORAL | 3 refills | Status: DC

## 2020-10-18 MED ORDER — GABAPENTIN 300 MG PO CAPS: ORAL_CAPSULE | ORAL | 3 refills | Status: DC

## 2020-10-18 MED ORDER — ATORVASTATIN CALCIUM 40 MG PO TABS: 40.0000 mg | ORAL_TABLET | Freq: Every day | ORAL | 3 refills | Status: DC

## 2020-10-25 ENCOUNTER — Other Ambulatory Visit: Payer: Self-pay

## 2020-10-27 MED ORDER — METFORMIN HCL 1000 MG PO TABS: 1000.0000 mg | ORAL_TABLET | Freq: Two times a day (BID) | ORAL | 3 refills | Status: DC

## 2020-10-27 MED ORDER — FUROSEMIDE 40 MG PO TABS
40.0000 mg | ORAL_TABLET | Freq: Every day | ORAL | 3 refills | Status: DC
Start: 2020-10-27 — End: 2021-08-28

## 2020-10-31 ENCOUNTER — Ambulatory Visit (INDEPENDENT_AMBULATORY_CARE_PROVIDER_SITE_OTHER): Payer: Medicare Other | Admitting: Family Medicine

## 2020-10-31 ENCOUNTER — Ambulatory Visit: Payer: Medicare Other | Admitting: Family Medicine

## 2020-10-31 DIAGNOSIS — E1140 Type 2 diabetes mellitus with diabetic neuropathy, unspecified: Secondary | ICD-10-CM

## 2020-10-31 DIAGNOSIS — Z5329 Procedure and treatment not carried out because of patient's decision for other reasons: Secondary | ICD-10-CM

## 2020-10-31 NOTE — Progress Notes (Signed)
Patient was scheduled 10/31/2020 to be seen for the following indications.  We will follow-up with patient when she reschedules.  Hypercalcemia: She is taking a thiazide as part of her Zestoretic and Vitamin D3 5,000 units daily. Both of these may raise serum calcium levels. The Institute of Medicine recommends the safe upper limit of Vitamin D3 daily dose is 4000 units.   Type 2 diabetes, controlled, with neuropathy (HCC) A1c 7.9%, last time was 8%. Goal 7%. Patient continues on Metformin, was only taking once daily.  -Continue Metformin 1043m BID -Recheck A1c in 3 months -Consider GLP agonist or SGLT2 for better control and cardiac/renal benefits

## 2020-11-09 ENCOUNTER — Ambulatory Visit (INDEPENDENT_AMBULATORY_CARE_PROVIDER_SITE_OTHER): Payer: Medicare Other

## 2020-11-09 DIAGNOSIS — I441 Atrioventricular block, second degree: Secondary | ICD-10-CM

## 2020-11-13 LAB — CUP PACEART REMOTE DEVICE CHECK
Battery Remaining Longevity: 96 mo
Battery Voltage: 3.06 V
Brady Statistic RV Percent Paced: 19.97 %
Date Time Interrogation Session: 20220519065844
Implantable Pulse Generator Implant Date: 20210817
Lead Channel Impedance Value: 480 Ohm
Lead Channel Pacing Threshold Amplitude: 0.625 V
Lead Channel Pacing Threshold Pulse Width: 0.4 ms
Lead Channel Sensing Intrinsic Amplitude: 21.938 mV
Lead Channel Setting Pacing Amplitude: 1.125
Lead Channel Setting Pacing Pulse Width: 0.4 ms
Lead Channel Setting Sensing Sensitivity: 2 mV

## 2020-11-16 ENCOUNTER — Other Ambulatory Visit: Payer: Self-pay

## 2020-11-16 ENCOUNTER — Encounter: Payer: Self-pay | Admitting: Family Medicine

## 2020-11-16 ENCOUNTER — Ambulatory Visit (INDEPENDENT_AMBULATORY_CARE_PROVIDER_SITE_OTHER): Payer: Medicare Other | Admitting: Family Medicine

## 2020-11-16 ENCOUNTER — Ambulatory Visit (INDEPENDENT_AMBULATORY_CARE_PROVIDER_SITE_OTHER): Payer: Medicare Other

## 2020-11-16 VITALS — BP 111/71 | HR 80 | Ht 68.0 in

## 2020-11-16 DIAGNOSIS — E1169 Type 2 diabetes mellitus with other specified complication: Secondary | ICD-10-CM | POA: Diagnosis not present

## 2020-11-16 DIAGNOSIS — E785 Hyperlipidemia, unspecified: Secondary | ICD-10-CM | POA: Diagnosis not present

## 2020-11-16 DIAGNOSIS — E1140 Type 2 diabetes mellitus with diabetic neuropathy, unspecified: Secondary | ICD-10-CM

## 2020-11-16 DIAGNOSIS — Z13820 Encounter for screening for osteoporosis: Secondary | ICD-10-CM | POA: Insufficient documentation

## 2020-11-16 DIAGNOSIS — E7800 Pure hypercholesterolemia, unspecified: Secondary | ICD-10-CM | POA: Diagnosis not present

## 2020-11-16 DIAGNOSIS — G6289 Other specified polyneuropathies: Secondary | ICD-10-CM | POA: Diagnosis not present

## 2020-11-16 DIAGNOSIS — K912 Postsurgical malabsorption, not elsewhere classified: Secondary | ICD-10-CM

## 2020-11-16 DIAGNOSIS — Z23 Encounter for immunization: Secondary | ICD-10-CM | POA: Diagnosis not present

## 2020-11-16 LAB — POCT GLYCOSYLATED HEMOGLOBIN (HGB A1C): HbA1c, POC (controlled diabetic range): 7.7 % — AB (ref 0.0–7.0)

## 2020-11-16 MED ORDER — METFORMIN HCL ER 500 MG PO TB24: 1000.0000 mg | ORAL_TABLET | Freq: Every day | ORAL | 1 refills | Status: DC

## 2020-11-16 MED ORDER — BLOOD GLUCOSE MONITOR KIT: PACK | 0 refills | Status: AC

## 2020-11-16 MED ORDER — GABAPENTIN 100 MG PO CAPS: 100.0000 mg | ORAL_CAPSULE | Freq: Every morning | ORAL | 3 refills | Status: DC

## 2020-11-16 MED ORDER — OZEMPIC (0.25 OR 0.5 MG/DOSE) 2 MG/1.5ML ~~LOC~~ SOPN: 0.5000 mg | PEN_INJECTOR | SUBCUTANEOUS | 1 refills | Status: DC

## 2020-11-16 MED ORDER — ATORVASTATIN CALCIUM 80 MG PO TABS: 80.0000 mg | ORAL_TABLET | Freq: Every day | ORAL | 3 refills | Status: DC

## 2020-11-16 NOTE — Patient Instructions (Addendum)
Thank you for coming in to see [place] today! Please see below to review our plan for today's visit:  1. We are changing Metformin to 1065m (2 tablets) once daily. We are adding on Ozempic/Semaglutide once weekly by injection.  2. Please start taking Atorvastatin/Lipitor 890mdaily.  3. I have ordered a DEXA/Bone scan to rule out osteoporosis.   We are rechecking Calcium level and Cholesterol today.  I have referred you to Gastroenterology to look into short bowel syndrome.   Please call the clinic at [phone] your symptoms worsen or you have any concerns. It was our pleasure to serve you!   Dr. HaMilus BanisteroRio Grande Hospitalamily Medicine

## 2020-11-16 NOTE — Assessment & Plan Note (Signed)
-  Continue Metformin -Started on Ozempic injection once weekly today -Follow up 3 months (Aug 2022) for repeat A1c -Glucometer ordered for patient today, she would like Continuous monitor eventually

## 2020-11-16 NOTE — Progress Notes (Signed)
    SUBJECTIVE:   CHIEF COMPLAINT / HPI:   Hypercalcemia: patient had Hypercalcemia. Was on Vitamin D (recently stopped) and BP med was changed to Amlodipine.   HLD: Patient's last Lipid panel in March 2022 showed LDL = 132. She is currently prescribed Lipitor 65m daily. Per pharmacy team initiative would recommend increasing to 85mdaily (if tolerated) and f/u direct LDL in 3 months (Aug 2022). ASCVD risk 19.8%  T2DM: last A1c 7.9% in Feb 2022. A1c today stable at 7.7%. Goal 7%. Patient continues on Metformin 100016mID. Consider GLP agonist or SGLT2 for better control and cardiac/renal benefits. Due for foot exam.   Health Maintenance: due for DEXA scan. Had one at age 46 44ght at onset of menopause.  Due for COVID Booster (received today)   PERTINENT  PMH / PSH:  Patient Active Problem List   Diagnosis Date Noted  . Osteoporosis screening 11/16/2020  . Memory loss 08/07/2020  . Serum calcium elevated 04/12/2020  . Hallux rigidus of left foot 04/12/2020  . Hyperparathyroidism (HCCMinto0/19/2021  . Presbycusis of both ears 04/11/2020  . No-show for appointment 03/08/2020  . Pacemaker   . Hypothyroidism 07/19/2019  . Hypertension associated with diabetes (HCCBancroft1/18/2020  . Dyslipidemia associated with type 2 diabetes mellitus (HCCMoody AFB1/18/2020  . Short bowel syndrome 07/11/2018  . Status post left knee replacement 07/11/2018  . Major depression in remission (HCCBonanza1/14/2020  . Coronary artery disease involving native coronary artery of native heart without angina pectoris 07/07/2018  . Paroxysmal atrial fibrillation (HCCInterior1/18/2019  . Age-related cognitive decline 04/30/2018  . Gastroesophageal reflux disease without esophagitis 04/15/2017  . B12 deficiency 12/13/2016  . Tubular adenoma of colon 03/09/2016  . Vitamin D deficiency 03/09/2016  . S/P bariatric surgery-duodenal switch with sleeve gastrectomy 12/04/2013  . Morbid obesity (HCCPembina3/10/2013  . Iron deficiency  anemia 06/19/2012  . Low back pain 03/05/2012  . PSORIASIS 05/28/2010  . Peripheral neuropathy 01/12/2009  . Type 2 diabetes, controlled, with neuropathy (HCCUniversity Park8/27/2009  . Obesity with sleep apnea 06/12/2007    OBJECTIVE:   BP 111/71   Pulse 80   Ht 5' 8"  (1.727 m)   SpO2 100%   BMI 36.66 kg/m    Physical exam: General: well appearing, no apparent distress Respiratory: comfortable work of breathing on room air   ASSESSMENT/PLAN:   Short bowel syndrome Patient with history of short bowel syndrome and malabsorption. She is concerned for weight gain and malnutrition.  -Referral placed to gastroenterology for further guidance and work up  Dyslipidemia associated with type 2 diabetes mellitus (HCCSchaefferstownSCVD risk 19.8%.  -Repeat Lipid panel today -Increase Lipitor from 40 up to 62m4mily -Repeat lipid panel Aug 2022  Osteoporosis screening -DEXA scan ordered at today's visit  Serum calcium elevated HCTZ and Vitamin D already discontinued, pt with Hyperparathyroidism.  -Will recheck Ca and Albumin today on CMP  Type 2 diabetes, controlled, with neuropathy (HCC)Sedaliaontinue Metformin -Started on Ozempic injection once weekly today -Follow up 3 months (Aug 2022) for repeat A1c -Glucometer ordered for patient today, she would like Continuous monitor eventually     [name] CMonro[place]

## 2020-11-16 NOTE — Assessment & Plan Note (Signed)
-  DEXA scan ordered at today's visit

## 2020-11-16 NOTE — Assessment & Plan Note (Signed)
Patient with history of short bowel syndrome and malabsorption. She is concerned for weight gain and malnutrition.  -Referral placed to gastroenterology for further guidance and work up

## 2020-11-16 NOTE — Assessment & Plan Note (Addendum)
HCTZ and Vitamin D already discontinued, pt with Hyperparathyroidism.  -Will recheck Ca and Albumin today on CMP

## 2020-11-16 NOTE — Assessment & Plan Note (Signed)
ASCVD risk 19.8%.  -Repeat Lipid panel today -Increase Lipitor from 40 up to 29m daily -Repeat lipid panel Aug 2022

## 2020-11-17 ENCOUNTER — Encounter: Payer: Self-pay | Admitting: Family Medicine

## 2020-11-17 ENCOUNTER — Telehealth: Payer: Self-pay | Admitting: *Deleted

## 2020-11-17 ENCOUNTER — Other Ambulatory Visit: Payer: Self-pay | Admitting: Family Medicine

## 2020-11-17 DIAGNOSIS — E213 Hyperparathyroidism, unspecified: Secondary | ICD-10-CM

## 2020-11-17 LAB — COMPREHENSIVE METABOLIC PANEL
ALT: 9 IU/L (ref 0–32)
AST: 12 IU/L (ref 0–40)
Albumin/Globulin Ratio: 1.7 (ref 1.2–2.2)
Albumin: 4.5 g/dL (ref 3.8–4.8)
Alkaline Phosphatase: 164 IU/L — ABNORMAL HIGH (ref 44–121)
BUN/Creatinine Ratio: 30 — ABNORMAL HIGH (ref 12–28)
BUN: 43 mg/dL — ABNORMAL HIGH (ref 8–27)
Bilirubin Total: 0.3 mg/dL (ref 0.0–1.2)
CO2: 23 mmol/L (ref 20–29)
Calcium: 11.1 mg/dL — ABNORMAL HIGH (ref 8.7–10.3)
Chloride: 101 mmol/L (ref 96–106)
Creatinine, Ser: 1.45 mg/dL — ABNORMAL HIGH (ref 0.57–1.00)
Globulin, Total: 2.6 g/dL (ref 1.5–4.5)
Glucose: 161 mg/dL — ABNORMAL HIGH (ref 65–99)
Potassium: 4.6 mmol/L (ref 3.5–5.2)
Sodium: 138 mmol/L (ref 134–144)
Total Protein: 7.1 g/dL (ref 6.0–8.5)
eGFR: 40 mL/min/{1.73_m2} — ABNORMAL LOW (ref >59)

## 2020-11-17 LAB — LIPID PANEL
Chol/HDL Ratio: 3.2 ratio (ref 0.0–4.4)
Cholesterol, Total: 185 mg/dL (ref 100–199)
HDL: 58 mg/dL (ref >39)
LDL Chol Calc (NIH): 112 mg/dL — ABNORMAL HIGH (ref 0–99)
Triglycerides: 84 mg/dL (ref 0–149)
VLDL Cholesterol Cal: 15 mg/dL (ref 5–40)

## 2020-11-17 NOTE — Progress Notes (Signed)
Patient with history of elevated Ca and PTH. Was recently increased on Lasix to help increase Ca secretion in urine; however is causing AKI.  Will send patient for further eval of Parathyroid gland.   I called Central Scheduling to schedule Sestamibi Parathyroid scan at [phone] opt 3.  Spoke with Peggy at scheduling, confirmed patient's ID with name and DOB, also verified telephone number and address.   Scan scheduled for  Wed June 1st at 10am for injection and 12noon, no prep.  [facility] at [facility] at 9:30am.    Called patient and left her VM with the information provided. Also sent MyChart message with the info.  Will attempt to call her back to make sure she got the message and make sure scheduled time fits her schedule.  Milus Banister, [place], PGY-3 11/17/2020 11:15 AM

## 2020-11-17 NOTE — Telephone Encounter (Signed)
Ortho bundle 1 year call and survey.

## 2020-11-22 ENCOUNTER — Encounter (HOSPITAL_COMMUNITY)
Admission: RE | Admit: 2020-11-22 | Discharge: 2020-11-22 | Disposition: A | Discharge status: Home / Self Care | Payer: Medicare Other | Source: Ambulatory Visit | Attending: Family Medicine | Admitting: Family Medicine

## 2020-11-22 ENCOUNTER — Other Ambulatory Visit: Payer: Self-pay

## 2020-11-22 DIAGNOSIS — E213 Hyperparathyroidism, unspecified: Secondary | ICD-10-CM | POA: Diagnosis present

## 2020-11-22 MED ORDER — TECHNETIUM TC 99M SESTAMIBI GENERIC - CARDIOLITE
25.9000 | Freq: Once | INTRAVENOUS | Status: AC | PRN
  Administered 2020-11-22: 25.9 via INTRAVENOUS

## 2020-11-23 ENCOUNTER — Other Ambulatory Visit: Payer: Self-pay | Admitting: Family Medicine

## 2020-11-23 DIAGNOSIS — D351 Benign neoplasm of parathyroid gland: Secondary | ICD-10-CM

## 2020-11-23 NOTE — Progress Notes (Signed)
Encounter for referral  Patient's recent sestamibi scan to assess for parathyroid abnormality returned positive for left-sided parathyroid adenoma.  Sestamibi scan was performed due to concern for elevated PTH and hypercalcemia.  We will place referral for ENT for patient to be evaluated and for removal of adenoma.  Milus Banister, [place], PGY-3 11/23/2020 3:13 PM

## 2020-11-30 ENCOUNTER — Telehealth: Payer: Self-pay

## 2020-11-30 ENCOUNTER — Other Ambulatory Visit: Payer: Self-pay | Admitting: Family Medicine

## 2020-11-30 DIAGNOSIS — E1140 Type 2 diabetes mellitus with diabetic neuropathy, unspecified: Secondary | ICD-10-CM

## 2020-11-30 NOTE — Telephone Encounter (Signed)
Pt called requesting Dr. Ouida Sills call her to follow-up with her, she hasn't receive a call yet from next provider regarding the tumor.

## 2020-12-01 ENCOUNTER — Encounter: Payer: Self-pay | Admitting: Oncology

## 2020-12-01 ENCOUNTER — Encounter: Payer: Self-pay | Admitting: Family Medicine

## 2020-12-01 NOTE — Progress Notes (Signed)
Remote pacemaker transmission.   

## 2020-12-26 NOTE — Progress Notes (Signed)
    SUBJECTIVE:   CHIEF COMPLAINT / HPI:   Right leg swelling Present x 1 year following her knee replacement. Feels that the swelling has improved over that time but it is still present to some degree. Occasionally has some numbness and pain in her leg. She has followed with Ortho and given some injections that helped only temporarily.  She is concerned because a recent friend was diagnosed with a blood clot so she wanted to get evaluated. Previously had a CT myelogram in 03/2020 for bilateral leg pain and swelling. This revealed multifactorial spinal stenosis at L3-L4 due to circumferential protrusion of disc and facet and ligamentous hypertrophy. Also with L4-L5 mild to moderate stenosis with potential for neural compression. Had lumbar spine ESI by Dr. Lucia Gaskins on 05/29/2020 with temporary relief.  Denies recent travel, recent surgeries, blood clotting disorders, history of DVT/PE.   Left Inferior Parathyroid Adenoma Recent had a positive sestamibi scan to assess for parathyroid abnormality given elevated PTH and hypercalcemia. This returned positive for left inferior parathyroid adenoma. Patient was referred to ENT for evaluation and removal. States she has heard from ENT office and has an appointment later this month.  PERTINENT  PMH / PSH: left inferior parathyroid adenoma, s/p left knee replacement, iron-deficiency anemia, hypothyroidism  OBJECTIVE:   BP 100/60   Pulse 75   Ht 5' 8"  (1.727 m)   Wt 232 lb (105.2 kg)   SpO2 98%   BMI 35.28 kg/m    General: NAD, pleasant, able to participate in exam Cardiac: Irregularly irregular, without murmur Respiratory: CTAB, normal effort, No wheezes, rales or rhonchi Extremities: Non-pitting edema right leg, non-tender to palpation, b/l vertical scars present, negative Homan's test right lower extremity  Skin: varicose veins present lower extremities Psych: Normal affect and mood     ASSESSMENT/PLAN:   Right leg swelling Present x1 year,  improving and not worsening.  No recent surgery, travel, injury or blood clotting disorders.  She is on Eliquis for her atrial fibrillation but does endorse not taking this twice a daily, only once daily. On exam she does not have any pitting edema or pain. There is slight asymmetric swelling of her right leg but reassuring that this has been present long-term and she reports improvement with it. Well score is -2 given alternative diagnosis to DVT is more likely. Return precautions for DVT/PE given. No further work-up or imaging needed at this time. Strongly encouraged patient to take Eliquis as prescribed, she states she will set an alarm to help remember her night-time dose of Eliquis.  Parathyroid adenoma Seen on recent scan. Has follow up with ENT scheduled for later this month.      Sharion Settler, [place]

## 2020-12-26 NOTE — Patient Instructions (Addendum)
It was wonderful to meet you today.  Please bring ALL of your medications with you to every visit.   Today we talked about:  It is important for you to take your Eliquis twice a day as prescribed.   If you develop asymmetrical swelling in your leg, increased leg pain, chest pain, palpitations or shortness of breath, please go to the Emergency Department or Urgent Care for evaluation.  Currently, I don't think that we need to do any imaging to evaluate for blood clots.    Thank you for choosing [facility].   Please call [phone] with any questions about today's appointment.  Please be sure to schedule follow up at the front  desk before you leave today.   Sharion Settler, DO PGY-1 Family Medicine

## 2020-12-27 ENCOUNTER — Ambulatory Visit (INDEPENDENT_AMBULATORY_CARE_PROVIDER_SITE_OTHER): Payer: Medicare Other | Admitting: Family Medicine

## 2020-12-27 ENCOUNTER — Other Ambulatory Visit: Payer: Self-pay

## 2020-12-27 ENCOUNTER — Encounter: Payer: Self-pay | Admitting: Family Medicine

## 2020-12-27 DIAGNOSIS — D351 Benign neoplasm of parathyroid gland: Secondary | ICD-10-CM | POA: Diagnosis not present

## 2020-12-27 DIAGNOSIS — M7989 Other specified soft tissue disorders: Secondary | ICD-10-CM | POA: Insufficient documentation

## 2020-12-27 NOTE — Assessment & Plan Note (Signed)
Present x1 year, improving and not worsening.  No recent surgery, travel, injury or blood clotting disorders.  She is on Eliquis for her atrial fibrillation but does endorse not taking this twice a daily, only once daily. On exam she does not have any pitting edema or pain. There is slight asymmetric swelling of her right leg but reassuring that this has been present long-term and she reports improvement with it. Well score is -2 given alternative diagnosis to DVT is more likely. Return precautions for DVT/PE given. No further work-up or imaging needed at this time. Strongly encouraged patient to take Eliquis as prescribed, she states she will set an alarm to help remember her night-time dose of Eliquis.

## 2020-12-27 NOTE — Assessment & Plan Note (Signed)
Seen on recent scan. Has follow up with ENT scheduled for later this month.

## 2021-01-09 ENCOUNTER — Ambulatory Visit (INDEPENDENT_AMBULATORY_CARE_PROVIDER_SITE_OTHER): Payer: Medicare Other | Admitting: Family Medicine

## 2021-01-09 ENCOUNTER — Other Ambulatory Visit: Payer: Self-pay

## 2021-01-09 ENCOUNTER — Encounter: Payer: Self-pay | Admitting: Family Medicine

## 2021-01-09 ENCOUNTER — Encounter: Payer: Self-pay | Admitting: Oncology

## 2021-01-09 VITALS — BP 130/58 | HR 63 | Ht 68.0 in | Wt 234.2 lb

## 2021-01-09 DIAGNOSIS — E785 Hyperlipidemia, unspecified: Secondary | ICD-10-CM | POA: Diagnosis not present

## 2021-01-09 DIAGNOSIS — E1159 Type 2 diabetes mellitus with other circulatory complications: Secondary | ICD-10-CM | POA: Diagnosis not present

## 2021-01-09 DIAGNOSIS — E1142 Type 2 diabetes mellitus with diabetic polyneuropathy: Secondary | ICD-10-CM

## 2021-01-09 DIAGNOSIS — E1140 Type 2 diabetes mellitus with diabetic neuropathy, unspecified: Secondary | ICD-10-CM | POA: Diagnosis present

## 2021-01-09 DIAGNOSIS — I152 Hypertension secondary to endocrine disorders: Secondary | ICD-10-CM | POA: Diagnosis not present

## 2021-01-09 DIAGNOSIS — E1169 Type 2 diabetes mellitus with other specified complication: Secondary | ICD-10-CM | POA: Diagnosis not present

## 2021-01-09 LAB — POCT GLYCOSYLATED HEMOGLOBIN (HGB A1C): HbA1c, POC (controlled diabetic range): 7.5 % — AB (ref 0.0–7.0)

## 2021-01-09 NOTE — Assessment & Plan Note (Signed)
>>  ASSESSMENT AND PLAN FOR TYPE 2 DM WITH DIABETIC NEUROPATHY AFFECTING BOTH SIDES OF BODY (HCC) WRITTEN ON 01/09/2021  7:40 PM BY GANTA, ANUPA, DO  -A1c 7.5, improved -patient has scheduled visit with pharmacy within 1-2 weeks for further options in place of metformin   -instructed to bring all medications to next visit  -repeat A1c in 3 months

## 2021-01-09 NOTE — Assessment & Plan Note (Addendum)
-  BP 151/58, 130/58 on repeat  -pending BMP, will notify patient of abnormal results  -instructed to discontinue lasix given concern for hyptension secondary to over corrected hypertension -continue amlodipine and spironolactone -encouraged to maintain adequate hydration  -f/u as appropriate

## 2021-01-09 NOTE — Patient Instructions (Signed)
It was great seeing you today!  Today we discussed your blood pressure and diabetes. Please stop taking the lasix as we discussed. Continue to eat healthy and stay active throughout the day. Your A1c is 7.5, which is improved. Continue taking metformin as able and please bring ALL your medications to your next appoint with pharmacy.   Please follow up at your next scheduled appointment in 1 week with the pharmacy team, if anything arises between now and then, please don't hesitate to contact our office.   Thank you for allowing [place] to be a part of your medical care!  Thank you, Dr. Larae Grooms

## 2021-01-09 NOTE — Assessment & Plan Note (Signed)
-  A1c 7.5, improved -patient has scheduled visit with pharmacy within 1-2 weeks for further options in place of metformin  -instructed to bring all medications to next visit  -repeat A1c in 3 months

## 2021-01-09 NOTE — Assessment & Plan Note (Signed)
-  continue statin -diet and exercise counseling provided

## 2021-01-09 NOTE — Progress Notes (Signed)
    SUBJECTIVE:   CHIEF COMPLAINT / HPI:   Type 2 DM Last A1c 7.7 about 3 months ago. Compliant on metformin until recently because she is having trouble with the large pills recently. Although she is unsure the dosage that she takes, she brought all of her medications in with the exception of her metformin. She believes this is due to her gastric bypass surgery more than a year ago. Sometimes she feels that she ends up gagging and even vomiting due being unable to swallow the pills. For this reason, she has been taking her metformin intermittently for the past 2-3 weeks. She is not check her blood glucose levels at home and denies symptoms consistent with hypoglycemia. Per prior note from last provider, states that she was started on weekly ozempic although patient denies every starting on this medication. Recent stressors include being primary caregiver to her handicap husband with progressive MS. Has an ophthalmology appointment scheduled for next month. Compliant on gabapentin for neuropathy.   PERTINENT  PMH / PSH:   Hypertension Denies chest pain, dyspnea and palpitations. Compliant on amlodipine, lasix and spironolactone. Endorses 3 episodes over the past few months where she has experienced lightheadedness.   Hyperlipidemia Most recent lipid panel notable for LDL of 112. Compliant on atorvastatin and tolerating well. Trying to eat healthy and remain as active as she can.   OBJECTIVE:   BP (!) 130/58 Comment: right arm  Pulse 63   Ht 5' 8"  (1.727 m)   Wt 234 lb 3.2 oz (106.2 kg)   SpO2 94%   BMI 35.61 kg/m   General: Patient well-appearing, in no acute distress. CV: RRR, no murmurs or gallops auscultated Resp: CTAB Abdomen: soft, nontender, nondistended, presence of bowel sounds Ext: radial pulses strong and equal bilaterally, no LE edema noted bilaterally  Neuro: normal gait Psych: mood appropriate   ASSESSMENT/PLAN:   Type 2 DM with diabetic neuropathy affecting both  sides of body (HCC) -A1c 7.5, improved -patient has scheduled visit with pharmacy within 1-2 weeks for further options in place of metformin  -instructed to bring all medications to next visit  -repeat A1c in 3 months   Hypertension associated with diabetes ([place]) -BP 151/58, 130/58 on repeat  -pending BMP, will notify patient of abnormal results  -instructed to discontinue lasix given concern for hyptension secondary to over corrected hypertension -continue amlodipine and spironolactone -encouraged to maintain adequate hydration  -f/u as appropriate    Hyperlipidemia associated with type 2 diabetes mellitus ([place]) -continue statin -diet and exercise counseling provided       Donney Dice, [place]

## 2021-01-09 NOTE — Progress Notes (Signed)
1c

## 2021-01-10 ENCOUNTER — Ambulatory Visit (INDEPENDENT_AMBULATORY_CARE_PROVIDER_SITE_OTHER): Payer: Medicare Other | Admitting: Otolaryngology

## 2021-01-10 LAB — BASIC METABOLIC PANEL
BUN/Creatinine Ratio: 18 (ref 12–28)
BUN: 19 mg/dL (ref 8–27)
CO2: 20 mmol/L (ref 20–29)
Calcium: 11.3 mg/dL — ABNORMAL HIGH (ref 8.7–10.3)
Chloride: 102 mmol/L (ref 96–106)
Creatinine, Ser: 1.03 mg/dL — ABNORMAL HIGH (ref 0.57–1.00)
Glucose: 150 mg/dL — ABNORMAL HIGH (ref 65–99)
Potassium: 4.5 mmol/L (ref 3.5–5.2)
Sodium: 140 mmol/L (ref 134–144)
eGFR: 60 mL/min/{1.73_m2} (ref >59)

## 2021-01-17 ENCOUNTER — Ambulatory Visit: Payer: Medicare Other | Admitting: Pharmacist

## 2021-01-19 ENCOUNTER — Ambulatory Visit (INDEPENDENT_AMBULATORY_CARE_PROVIDER_SITE_OTHER): Payer: Medicare Other | Admitting: Pharmacist

## 2021-01-19 ENCOUNTER — Other Ambulatory Visit: Payer: Self-pay

## 2021-01-19 DIAGNOSIS — E1142 Type 2 diabetes mellitus with diabetic polyneuropathy: Secondary | ICD-10-CM | POA: Diagnosis not present

## 2021-01-19 DIAGNOSIS — Z79899 Other long term (current) drug therapy: Secondary | ICD-10-CM | POA: Diagnosis not present

## 2021-01-19 MED ORDER — OZEMPIC (0.25 OR 0.5 MG/DOSE) 2 MG/1.5ML ~~LOC~~ SOPN
0.2500 mg | PEN_INJECTOR | SUBCUTANEOUS | 0 refills | Status: DC
Start: 2021-01-19 — End: 2021-08-23

## 2021-01-19 MED ORDER — METFORMIN HCL 1000 MG PO TABS: 1000.0000 mg | ORAL_TABLET | Freq: Every day | ORAL | 0 refills | Status: DC

## 2021-01-19 NOTE — Progress Notes (Signed)
   Subjective:    Patient ID: [name], female    DOB: 01/14/1956, 65 y.o.   MRN: [id number]  HPI  Patient is a 65 y.o. female who presents for medication review and management. She is in good spirits and presents without assistance. Patient was referred and last seen by Primary Care Provider on 01/09/21.  Patient had gastric bypass in 2015 and has difficulty swallowing large pills since then. She has not taken metformin in quite some time because of this issue.   Medication Adherence Questionnaire (A score of 2 or more points indicates risk for nonadherence)  Do you know what each of your medicines is for? 1 (1 point if no)  Do you ever have trouble remembering to take your medicine? 2 (2 points if yes)  Do you ever not take a medicine because you feel you do not need it?  0  (1 point if yes)  Do you think that any of your medicines is not helping you? 1 "sertraline is not helping my mood" (1 point if yes)  Do you have any physical problems such as vision loss that keep you from taking your medicines as prescribed?  0 (2 points if yes)  Do you think any of your medicine is causing a side effect?  0 (1 point if yes)  Do you know the names of ALL of your medicines? 1 (1 point if no)  Do you think that you need ALL of your medicines? 1: "I don't think I need the mood medicine because it isn't helping" (1 point if no)  In the past 6 months, have you missed getting a refill or a new prescription filled on time?  0 (1 point if yes)  How often do you miss taking a dose of medicine?  2 Never (0 points), 1 or 2 times a month (1 points), 1 time a week (2 points), 2 or more times a week (3 points).   TOTAL SCORE 8/14    Objective:   Labs:   Physical Exam Neurological:     Mental Status: She is alert and oriented to person, place, and time.    Review of Systems  Respiratory:  Negative for shortness of breath.   Cardiovascular:  Positive for leg swelling. Negative for chest pain.        Patient has right leg swelling - previously noted on exam at past visits. No changes   Assessment/Plan:   Understanding of regimen: fair  Understanding of indications: fair  Potential of compliance: fair  Patient has known adherence challenges based on score of 8 for questionnaire. Barriers include: lack of knowledge and forgetfulness. Medication list reviewed and updated. Patient was provided with a printed medication list. Set up morning and evening alarms on patient's phone to help her remember to take her medications. Sent in new prescription for metformin as patient was unable to crush XR form. Patient will try crushing tablets to see if this improves ability to take medication.  Filled out application for patient assistance for Ozempic. Patient previously prescribed by prior PCP, Dr. Ouida Sills, but was never able to pick up due to cost.  Follow-up appointment pending approval of patient assistance. Written patient instructions provided.  This appointment required 60 minutes of patient care (this includes precharting, chart review, review of results, and face-to-face care).  Thank you for involving pharmacy to assist in providing this patient's care

## 2021-01-19 NOTE — Addendum Note (Signed)
Addended by: Hughes Better on: 01/19/2021 03:59 PM   Modules accepted: Orders

## 2021-01-19 NOTE — Patient Instructions (Signed)
[name] it was a pleasure seeing you today.   Today we reviewed all of the medications you are currently taking. Included is an updated medication list. Please continue taking all medications as prescribed on this list.  I sent in a new prescription for metformin 1073m. You can crush one pill a day.  To help you remember to take your medications:  - Set up phone alarms at morning and at night  We are going to apply for patient assistance for Ozempic. We will let you know if you are approved.  If you have any questions please call the clinic and ask to speak with me.  Follow-up with provider at next scheduled appointment

## 2021-01-23 LAB — HM DIABETES EYE EXAM

## 2021-02-08 ENCOUNTER — Other Ambulatory Visit: Payer: Self-pay | Admitting: Internal Medicine

## 2021-02-08 ENCOUNTER — Ambulatory Visit (INDEPENDENT_AMBULATORY_CARE_PROVIDER_SITE_OTHER): Payer: Medicare Other

## 2021-02-08 DIAGNOSIS — I441 Atrioventricular block, second degree: Secondary | ICD-10-CM | POA: Diagnosis not present

## 2021-02-12 ENCOUNTER — Ambulatory Visit: Payer: Self-pay

## 2021-02-12 ENCOUNTER — Encounter: Payer: Self-pay | Admitting: Cardiology

## 2021-02-12 ENCOUNTER — Other Ambulatory Visit: Payer: Self-pay

## 2021-02-12 ENCOUNTER — Ambulatory Visit (INDEPENDENT_AMBULATORY_CARE_PROVIDER_SITE_OTHER): Payer: Medicare Other | Admitting: Orthopedic Surgery

## 2021-02-12 ENCOUNTER — Ambulatory Visit (INDEPENDENT_AMBULATORY_CARE_PROVIDER_SITE_OTHER): Payer: Medicare Other

## 2021-02-12 DIAGNOSIS — Z96651 Presence of right artificial knee joint: Secondary | ICD-10-CM

## 2021-02-12 DIAGNOSIS — R2689 Other abnormalities of gait and mobility: Secondary | ICD-10-CM

## 2021-02-12 DIAGNOSIS — M48061 Spinal stenosis, lumbar region without neurogenic claudication: Secondary | ICD-10-CM | POA: Diagnosis not present

## 2021-02-12 DIAGNOSIS — Z96652 Presence of left artificial knee joint: Secondary | ICD-10-CM

## 2021-02-12 LAB — CUP PACEART REMOTE DEVICE CHECK
Battery Remaining Longevity: 96 mo
Battery Voltage: 3.04 V
Brady Statistic RV Percent Paced: 20.84 %
Date Time Interrogation Session: 20220819121144
Implantable Pulse Generator Implant Date: 20210817
Lead Channel Impedance Value: 550 Ohm
Lead Channel Pacing Threshold Amplitude: 0.625 V
Lead Channel Pacing Threshold Pulse Width: 0.4 ms
Lead Channel Sensing Intrinsic Amplitude: 22.5 mV
Lead Channel Setting Pacing Amplitude: 1.125
Lead Channel Setting Pacing Pulse Width: 0.4 ms
Lead Channel Setting Sensing Sensitivity: 2 mV

## 2021-02-12 NOTE — Progress Notes (Signed)
Surgical Instructions   Your procedure is scheduled on Monday 02/19/2021.  Report to [facility] Main Entrance "A" at 08:00 A.M., then check in with the Admitting office.  Call [phone] if you have problems or questions between now and the morning of surgery:   Remember: Do not eat after midnight the night before your surgery  You may drink clear liquids until 07:00am the morning of your surgery.   Clear liquids allowed are: Water, Non-Citrus Juices (without pulp), Carbonated Beverages, Clear Tea, Black Coffee Only, and Gatorade   Take these medicines the morning of surgery with A SIP OF WATER  Amlodipine (Norvasc) Gabapentin (Neurontin) Sertraline (Zoloft)  If needed you may take these medications the morning of surgery: Methocarbamol (Robaxin)   As of today, STOP taking any Aspirin (unless otherwise instructed by your surgeon) or Aspirin-containing products; NSAIDS - Aleve, Naproxen, Ibuprofen, Motrin, Advil, Goody's, BC's, all herbal medications, fish oil, and all vitamins.  Follow your surgeon's instructions on when to stop Apixaban (Eliquis).  If no instructions were given by your surgeon then you will need to call the office to get those instructions.      HOW TO MANAGE YOUR DIABETES BEFORE AND AFTER SURGERY  Why is it important to control my blood sugar before and after surgery? Improving blood sugar levels before and after surgery helps healing and can limit problems. A way of improving blood sugar control is eating a healthy diet by:  Eating less sugar and carbohydrates  Increasing activity/exercise  Talking with your doctor about reaching your blood sugar goals High blood sugars (greater than 180 mg/dL) can raise your risk of infections and slow your recovery, so you will need to focus on controlling your diabetes during the weeks before surgery. Make sure that the doctor who takes care of your diabetes knows about your planned surgery including the date and  location.  How do I manage my blood sugar before surgery? Check your blood sugar at least 4 times a day, starting 2 days before surgery, to make sure that the level is not too high or low.  Check your blood sugar the morning of your surgery when you wake up and every 2 hours until you get to the Short Stay unit.  If your blood sugar is less than 70 mg/dL, you will need to treat for low blood sugar: Do not take insulin. Treat a low blood sugar (less than 70 mg/dL) with  cup of clear juice (cranberry or apple), 4 glucose tablets, OR glucose gel. Recheck blood sugar in 15 minutes after treatment (to make sure it is greater than 70 mg/dL). If your blood sugar is not greater than 70 mg/dL on recheck, call [phone] for further instructions. Report your blood sugar to the short stay nurse when you get to Short Stay.  If you are admitted to the hospital after surgery: Your blood sugar will be checked by the staff and you will probably be given insulin after surgery (instead of oral diabetes medicines) to make sure you have good blood sugar levels. The goal for blood sugar control after surgery is 80-180 mg/dL.            Do not wear jewelry or makeup Do not wear lotions, powders, perfumes, or deodorant. Do not shave 48 hours prior to surgery. Do not wear nail polish, gel polish, artificial nails, or any other type of covering on natural nails including fingernails and toenails. If patients have artificial nails, gel coating, etc. that need to  be removed by a nail salon please have this removed prior to surgery or surgery may need to be canceled/delayed if the surgeon/ anesthesia feels like the patient is unable to be adequately monitored. Do not bring valuables to the hospital - [facility] is not responsible for any belongings or valuables.              Do NOT Smoke (Tobacco/Vaping) or drink Alcohol 24 hours prior to your procedure  If you use a CPAP at night, you may bring all equipment  for your overnight stay.   Contacts, glasses, hearing aids, dentures or partials may not be worn into surgery, please bring cases for these belongings   For patients admitted to the hospital, discharge time will be determined by your treatment team.   Patients discharged the day of surgery will not be allowed to drive home, and someone needs to stay with them for 24 hours.  ONLY ONE (1) SUPPORT PERSON MAY WAIT IN THE WAITING AREA WHILE YOU ARE IN SURGERY. NO VISITORS WILL BE ALLOWED IN PRE-OP WHERE PATIENTS GET READY FOR SURGERY.  TWO (2) VISITORS WILL BE ALLOWED IN YOUR ROOM IF YOU ARE ADMITTED AFTER SURGERY.  Minor children may have two parents present. Special consideration for safety and communication needs will be reviewed on a case by case basis.  Special instructions:    Oral Hygiene is also important to reduce your risk of infection.  Remember - BRUSH YOUR TEETH THE MORNING OF SURGERY WITH YOUR REGULAR TOOTHPASTE   [facility]- Preparing For Surgery  Before surgery, you can play an important role. Because skin is not sterile, your skin needs to be as free of germs as possible. You can reduce the number of germs on your skin by washing with CHG (chlorahexidine gluconate) Soap before surgery.  CHG is an antiseptic cleaner which kills germs and bonds with the skin to continue killing germs even after washing.     Please do not use if you have an allergy to CHG or antibacterial soaps. If your skin becomes reddened/irritated stop using the CHG.  Do not shave (including legs and underarms) for at least 48 hours prior to first CHG shower. It is OK to shave your face.  Please follow these instructions carefully.     Shower the NIGHT BEFORE SURGERY and the MORNING OF SURGERY with CHG Soap.   If you chose to wash your hair, wash your hair first as usual with your normal shampoo. After you shampoo, rinse your hair and body thoroughly to remove the shampoo.    Then [employer] and genitals  (private parts) with your normal soap and rinse thoroughly to remove soap.  Next use the CHG Soap as you would any other liquid soap. You can apply CHG directly to the skin and wash gently with a clean washcloth.   Apply the CHG Soap to your body ONLY FROM THE NECK DOWN.  Do not use on open wounds or open sores. Avoid contact with your eyes, ears, mouth and genitals (private parts). Wash Face and genitals (private parts)  with your normal soap.   Wash thoroughly, paying special attention to the area where your surgery will be performed.  Thoroughly rinse your body with warm water from the neck down.  DO NOT shower/wash with your normal soap after using and rinsing off the CHG Soap.  Pat yourself dry with a CLEAN TOWEL.  Wear CLEAN PAJAMAS to bed the night before surgery  Place CLEAN SHEETS  on your bed the night before your surgery  DO NOT SLEEP WITH PETS.   Day of Surgery:  Take a shower with CHG soap. Wear Clean/Comfortable clothing the morning of surgery Do not apply any deodorants/lotions.   Remember to brush your teeth WITH YOUR REGULAR TOOTHPASTE.   Please read over the following fact sheets that you were given.

## 2021-02-12 NOTE — H&P (Signed)
HPI:   [name] is a 65 y.o. female who presents as a consult Patient.   Referring Provider: Pcp, Verify Patient Has  Chief complaint: Hyperparathyroidism.  HPI: Abnormally high elevated calcium levels since December 2019. PTH level was elevated more recently. Sestamibi scan reveals a left inferior parathyroid gland with abnormal signal. She has a history of gastric bypass surgery. She has lost over 100 pounds. He has a history of diabetes. Thyroid function testing was recently normal. She is on Eliquis but has been able to come off of it as needed.  PMH/Meds/All/SocHx/FamHx/ROS:   Past Medical History:  Diagnosis Date   Depression   Diabetes mellitus (HCC)   Hypercholesteremia   Hypertension   IBS (irritable bowel syndrome)   Torn meniscus   Past Surgical History:  Procedure Laterality Date   COLONOSCOPY   FLEXIBLE SIGMOIDOSCOPY   GASTRIC BYPASS   KNEE SURGERY Right   ROTATOR CUFF REPAIR Right   VEIN LIGATION AND STRIPPING   No family history of bleeding disorders, wound healing problems or difficulty with anesthesia.   Social History   Socioeconomic History   Marital status: Married  Spouse name: Not on file   Number of children: Not on file   Years of education: Not on file   Highest education level: Not on file  Occupational History   Not on file  Tobacco Use   Smoking status: Never Smoker   Smokeless tobacco: Not on file  Substance and Sexual Activity   Alcohol use: No   Drug use: Not on file   Sexual activity: Not on file  Other Topics Concern   Not on file  Social History Narrative   Not on file   Social Determinants of Health   Financial Resource Strain: Not on file  Food Insecurity: Not on file  Transportation Needs: Not on file  Physical Activity: Not on file  Stress: Not on file  Social Connections: Not on file  Housing Stability: Not on file   Current Outpatient Medications:   ACCU-CHEK SMARTVIEW TEST STRIP Strp, , Disp: , Rfl:    amLODIPine (NORVASC) 10 MG tablet, Take 10 mg by mouth daily. , Disp: , Rfl:   aspirin (ADULT LOW DOSE ASPIRIN) 81 MG EC tablet *ANTIPLATELET*, Take 81 mg by mouth daily. , Disp: , Rfl:   atorvastatin (LIPITOR) 40 MG tablet, Take 40 mg by mouth daily. , Disp: , Rfl:   betamethasone, augmented, (DIPROLENE) 0.05 % cream, Apply topically., Disp: , Rfl:   cholecalciferol, vitamin D3, 5,000 unit/mL Drop, Take 5,000 Units by mouth daily. , Disp: , Rfl:   gabapentin (NEURONTIN) 300 MG capsule, Take 300 mg by mouth 3 times daily. , Disp: , Rfl:   metFORMIN (GLUCOPHAGE) 1000 MG tablet, TAKE 1 TABLET TWICE DAILY WITH A MEAL, Disp: , Rfl:   metoPROLOL succinate (TOPROL-XL) 25 MG 24 hr tablet, Take 25 mg by mouth daily. , Disp: , Rfl:   quinapril (ACCUPRIL) 40 MG tablet, Take 40 mg by mouth daily. , Disp: , Rfl:   venlafaxine (EFFEXOR-XR) 75 MG 24 hr capsule, TAKE 3 CAPSULES EVERY DAY, Disp: , Rfl:   A complete ROS was performed with pertinent positives/negatives noted in the HPI. The remainder of the ROS are negative.   Physical Exam:   There were no vitals taken for this visit.  General: Healthy and alert, in no distress, breathing easily. Normal affect. In a pleasant mood. Head: Normocephalic, atraumatic. No masses, or scars. Eyes: Pupils are equal, and reactive to  light. Vision is grossly intact. No spontaneous or gaze nystagmus. Ears: Ear canals are clear. Tympanic membranes are intact, with normal landmarks and the middle ears are clear and healthy. Hearing: Grossly normal. Nose: Nasal cavities are clear with healthy mucosa, no polyps or exudate. Airways are patent. Face: No masses or scars, facial nerve function is symmetric. Oral Cavity: No mucosal abnormalities are noted. Tongue with normal mobility. Dentition appears healthy. Oropharynx: Tonsils are symmetric. There are no mucosal masses identified. Tongue base appears normal and healthy. Larynx/Hypopharynx: indirect exam reveals healthy,  mobile vocal cords, without mucosal lesions in the hypopharynx or larynx. Chest: Deferred Neck: No palpable masses, no cervical adenopathy, no thyroid nodules or enlargement. Neuro: Cranial nerves II-XII with normal function. Balance: Normal gate. Other findings: none.  Independent Review of Additional Tests or Records:  Sestamibi scan:  IMPRESSION:  Positive exam for presence of a LEFT inferior parathyroid adenoma.   Procedures:  none  Impression & Plans:  Hypercalcemia, hyperparathyroidism, sestamibi imaging reveals suspicious left inferior parathyroid gland. We discussed the importance of surgical correction of this. She has had bilateral knee replacement surgery. She has had gastric bypass so her vitamin and mineral absorption is likely abnormal as well. Given all of these factors I think surgical resection of the abnormal parathyroid gland would be crucial. We discussed risks and benefits of the surgery. All questions were answered. We will schedule at her convenience.

## 2021-02-12 NOTE — Progress Notes (Signed)
PERIOPERATIVE PRESCRIPTION FOR IMPLANTED CARDIAC DEVICE PROGRAMMING  Patient Information: Name:  [name]  DOB:  15-Jan-1956  MRN:  [id number]    Planned Procedure:  Left parathyroidectomy with frozen section  Surgeon:  Dr. Izora Gala  Date of Procedure:  Monday 02/19/2021  Cautery will be used.  Position during surgery:  supine   Please send documentation back to:  [facility] (Fax # [phone])  Device Information:  Clinic EP Physician:  Dr. Lars Mage   Device Type:  Pacemaker Manufacturer and Phone #:  Medtronic: [id number] Pacemaker Dependent?:  No. Date of Last Device Check:  03/11/21 (remote) Normal Device Function?:  Yes.    Electrophysiologist's Recommendations:  Have magnet available. Provide continuous ECG monitoring when magnet is used or reprogramming is to be performed.  Procedure may interfere with device function.  Magnet should be placed over device during procedure.  Per Device Clinic Standing Orders, Simone Curia, RN  3:31 PM 02/12/2021

## 2021-02-13 ENCOUNTER — Inpatient Hospital Stay (HOSPITAL_COMMUNITY)
Admission: RE | Admit: 2021-02-13 | Discharge: 2021-02-13 | Disposition: A | Discharge status: Home / Self Care | Payer: Medicare Other | Source: Ambulatory Visit

## 2021-02-13 NOTE — Progress Notes (Signed)
Called and spoke to [name] who didn't realize her appt was today.  I told her we would try to reschedule her and would speak to our scheduler Baker Janus about another appt. Baker Janus is aware and will try to get another time scheduled.

## 2021-02-13 NOTE — Progress Notes (Addendum)
Surgical Instructions     Your procedure is scheduled on Monday 02/19/2021.   Report to [facility] Main Entrance "A" at 08:00 A.M., then check in with the Admitting office.   Call [phone] if you have problems or questions between now and the morning of surgery:              Remember: Do not eat or drink anything after midnight the night before your surgery               Take these medicines the morning of surgery with A SIP OF WATER  Amlodipine (Norvasc) Gabapentin (Neurontin) Sertraline (Zoloft) Teduglutide   If needed you may take these medications the morning of surgery: Methocarbamol (Robaxin)     As of today, STOP taking any Aspirin (unless otherwise instructed by your surgeon) or Aspirin-containing products; NSAIDS - Aleve, Naproxen, Ibuprofen, Motrin, Advil, Goody's, BC's, all herbal medications, fish oil, and all vitamins.   Follow your surgeon's instructions on when to stop Apixaban (Eliquis).  If no instructions were given by your surgeon then you will need to call the office to get those instructions.         HOW TO MANAGE YOUR DIABETES BEFORE AND AFTER SURGERY   Why is it important to control my blood sugar before and after surgery? Improving blood sugar levels before and after surgery helps healing and can limit problems. A way of improving blood sugar control is eating a healthy diet by:  Eating less sugar and carbohydrates  Increasing activity/exercise  Talking with your doctor about reaching your blood sugar goals High blood sugars (greater than 180 mg/dL) can raise your risk of infections and slow your recovery, so you will need to focus on controlling your diabetes during the weeks before surgery. Make sure that the doctor who takes care of your diabetes knows about your planned surgery including the date and location.   How do I manage my blood sugar before surgery? Check your blood sugar at least 4 times a day, starting 2 days before surgery, to make  sure that the level is not too high or low.   Check your blood sugar the morning of your surgery when you wake up and every 2 hours until you get to the Short Stay unit.   If your blood sugar is less than 70 mg/dL, you will need to treat for low blood sugar: Do not take insulin. Treat a low blood sugar (less than 70 mg/dL) with  cup of clear juice (cranberry or apple), 4 glucose tablets, OR glucose gel. Recheck blood sugar in 15 minutes after treatment (to make sure it is greater than 70 mg/dL). If your blood sugar is not greater than 70 mg/dL on recheck, call [phone] for further instructions. Report your blood sugar to the short stay nurse when you get to Short Stay.   If you are admitted to the hospital after surgery: Your blood sugar will be checked by the staff and you will probably be given insulin after surgery (instead of oral diabetes medicines) to make sure you have good blood sugar levels. The goal for blood sugar control after surgery is 80-180 mg/dL.              Do not wear jewelry or makeup Do not wear lotions, powders, perfumes, or deodorant. Do not shave 48 hours prior to surgery. Do not wear nail polish, gel polish, artificial nails, or any other type of covering on natural nails including fingernails and  toenails. If patients have artificial nails, gel coating, etc. that need to be removed by a nail salon please have this removed prior to surgery or surgery may need to be canceled/delayed if the surgeon/ anesthesia feels like the patient is unable to be adequately monitored. Do not bring valuables to the hospital - [facility] is not responsible for any belongings or valuables.               Do NOT Smoke (Tobacco/Vaping) or drink Alcohol 24 hours prior to your procedure   If you use a CPAP at night, you may bring all equipment for your overnight stay.   Contacts, glasses, hearing aids, dentures or partials may not be worn into surgery, please bring cases for these  belongings   For patients admitted to the hospital, discharge time will be determined by your treatment team.   Patients discharged the day of surgery will not be allowed to drive home, and someone needs to stay with them for 24 hours.   ONLY ONE (1) SUPPORT PERSON MAY WAIT IN THE WAITING AREA WHILE YOU ARE IN SURGERY. NO VISITORS WILL BE ALLOWED IN PRE-OP WHERE PATIENTS GET READY FOR SURGERY.  TWO (2) VISITORS WILL BE ALLOWED IN YOUR ROOM IF YOU ARE ADMITTED AFTER SURGERY.  Minor children may have two parents present. Special consideration for safety and communication needs will be reviewed on a case by case basis.   Special instructions:     Oral Hygiene is also important to reduce your risk of infection.  Remember - BRUSH YOUR TEETH THE MORNING OF SURGERY WITH YOUR REGULAR TOOTHPASTE     [facility]- Preparing For Surgery   Before surgery, you can play an important role. Because skin is not sterile, your skin needs to be as free of germs as possible. You can reduce the number of germs on your skin by washing with CHG (chlorahexidine gluconate) Soap before surgery.  CHG is an antiseptic cleaner which kills germs and bonds with the skin to continue killing germs even after washing.       Please do not use if you have an allergy to CHG or antibacterial soaps. If your skin becomes reddened/irritated stop using the CHG.  Do not shave (including legs and underarms) for at least 48 hours prior to first CHG shower. It is OK to shave your face.   Please follow these instructions carefully.                                                                                                                                  Shower the NIGHT BEFORE SURGERY and the MORNING OF SURGERY with CHG Soap.    If you chose to wash your hair, wash your hair first as usual with your normal shampoo. After you shampoo, rinse your hair and body thoroughly to remove the shampoo.     Then [employer] and genitals (private  parts)  with your normal soap and rinse thoroughly to remove soap.   Next use the CHG Soap as you would any other liquid soap. You can apply CHG directly to the skin and wash gently with a clean washcloth.    Apply the CHG Soap to your body ONLY FROM THE NECK DOWN.  Do not use on open wounds or open sores. Avoid contact with your eyes, ears, mouth and genitals (private parts). Wash Face and genitals (private parts)  with your normal soap.    Wash thoroughly, paying special attention to the area where your surgery will be performed.   Thoroughly rinse your body with warm water from the neck down.   DO NOT shower/wash with your normal soap after using and rinsing off the CHG Soap.   Pat yourself dry with a CLEAN TOWEL.   Wear CLEAN PAJAMAS to bed the night before surgery   Place CLEAN SHEETS on your bed the night before your surgery   DO NOT SLEEP WITH PETS.     Day of Surgery:   Take a shower with CHG soap. Wear Clean/Comfortable clothing the morning of surgery Do not apply any deodorants/lotions.   Remember to brush your teeth WITH YOUR REGULAR TOOTHPASTE.   Please read over the following fact sheets that you were given.                  Note Details  Author Nena Polio, RN File Time 02/12/2021  4:04 PM  Author Type Registered Nurse Status Signed  Last Editor Nena Polio, RN Service Nursing

## 2021-02-15 ENCOUNTER — Other Ambulatory Visit: Payer: Self-pay

## 2021-02-15 ENCOUNTER — Ambulatory Visit
Admission: RE | Admit: 2021-02-15 | Discharge: 2021-02-15 | Disposition: A | Discharge status: Home / Self Care | Payer: Medicare Other | Source: Ambulatory Visit | Attending: Orthopedic Surgery | Admitting: Orthopedic Surgery

## 2021-02-15 ENCOUNTER — Encounter (HOSPITAL_COMMUNITY)
Admission: RE | Admit: 2021-02-15 | Discharge: 2021-02-15 | Disposition: A | Discharge status: Home / Self Care | Payer: Medicare Other | Source: Ambulatory Visit | Attending: Otolaryngology | Admitting: Otolaryngology

## 2021-02-15 ENCOUNTER — Encounter (HOSPITAL_COMMUNITY): Payer: Self-pay

## 2021-02-15 DIAGNOSIS — E119 Type 2 diabetes mellitus without complications: Secondary | ICD-10-CM | POA: Diagnosis not present

## 2021-02-15 DIAGNOSIS — Z20822 Contact with and (suspected) exposure to covid-19: Secondary | ICD-10-CM | POA: Insufficient documentation

## 2021-02-15 DIAGNOSIS — I4821 Permanent atrial fibrillation: Secondary | ICD-10-CM | POA: Insufficient documentation

## 2021-02-15 DIAGNOSIS — Z01818 Encounter for other preprocedural examination: Secondary | ICD-10-CM | POA: Insufficient documentation

## 2021-02-15 DIAGNOSIS — M48061 Spinal stenosis, lumbar region without neurogenic claudication: Secondary | ICD-10-CM

## 2021-02-15 DIAGNOSIS — I447 Left bundle-branch block, unspecified: Secondary | ICD-10-CM | POA: Diagnosis not present

## 2021-02-15 DIAGNOSIS — Z950 Presence of cardiac pacemaker: Secondary | ICD-10-CM | POA: Diagnosis not present

## 2021-02-15 DIAGNOSIS — Z7901 Long term (current) use of anticoagulants: Secondary | ICD-10-CM | POA: Diagnosis not present

## 2021-02-15 LAB — COMPREHENSIVE METABOLIC PANEL
ALT: 9 U/L (ref 0–44)
AST: 14 U/L — ABNORMAL LOW (ref 15–41)
Albumin: 3.9 g/dL (ref 3.5–5.0)
Alkaline Phosphatase: 114 U/L (ref 38–126)
Anion gap: 5 (ref 5–15)
BUN: 13 mg/dL (ref 8–23)
CO2: 27 mmol/L (ref 22–32)
Calcium: 10.8 mg/dL — ABNORMAL HIGH (ref 8.9–10.3)
Chloride: 108 mmol/L (ref 98–111)
Creatinine, Ser: 0.92 mg/dL (ref 0.44–1.00)
GFR, Estimated: >60 mL/min (ref >60)
Glucose, Bld: 172 mg/dL — ABNORMAL HIGH (ref 70–99)
Potassium: 4.6 mmol/L (ref 3.5–5.1)
Sodium: 140 mmol/L (ref 135–145)
Total Bilirubin: 0.8 mg/dL (ref 0.3–1.2)
Total Protein: 7.1 g/dL (ref 6.5–8.1)

## 2021-02-15 LAB — GLUCOSE, CAPILLARY: Glucose-Capillary: 176 mg/dL — ABNORMAL HIGH (ref 70–99)

## 2021-02-15 LAB — CBC
HCT: 43.1 % (ref 36.0–46.0)
Hemoglobin: 13.3 g/dL (ref 12.0–15.0)
MCH: 27 pg (ref 26.0–34.0)
MCHC: 30.9 g/dL (ref 30.0–36.0)
MCV: 87.6 fL (ref 80.0–100.0)
Platelets: 221 10*3/uL (ref 150–400)
RBC: 4.92 MIL/uL (ref 3.87–5.11)
RDW: 13.4 % (ref 11.5–15.5)
WBC: 6.4 10*3/uL (ref 4.0–10.5)
nRBC: 0 % (ref 0.0–0.2)

## 2021-02-15 LAB — SARS CORONAVIRUS 2 (TAT 6-24 HRS): SARS Coronavirus 2: NEGATIVE

## 2021-02-15 MED ORDER — MEPERIDINE HCL 50 MG/ML IJ SOLN
50.0000 mg | Freq: Once | INTRAMUSCULAR | Status: AC | PRN
  Administered 2021-02-15: 50 mg via INTRAMUSCULAR

## 2021-02-15 MED ORDER — ONDANSETRON HCL 4 MG/2ML IJ SOLN
4.0000 mg | Freq: Once | INTRAMUSCULAR | Status: AC | PRN
  Administered 2021-02-15: 4 mg via INTRAMUSCULAR

## 2021-02-15 MED ORDER — IOPAMIDOL (ISOVUE-M 200) INJECTION 41%
20.0000 mL | Freq: Once | INTRAMUSCULAR | Status: AC
  Administered 2021-02-15: 20 mL via INTRATHECAL

## 2021-02-15 MED ORDER — DIAZEPAM 5 MG PO TABS
5.0000 mg | ORAL_TABLET | Freq: Once | ORAL | Status: AC
  Administered 2021-02-15: 5 mg via ORAL

## 2021-02-15 NOTE — Discharge Instructions (Signed)

## 2021-02-15 NOTE — Discharge Instr - Other Orders (Addendum)
10:40 Pain reported, see MAR.  11:00 relief reported

## 2021-02-15 NOTE — Progress Notes (Signed)
PCP - Dr. Donney Dice Cardiologist - pt is unsure, she said the last person she saw in Cardiology was Dr. Thompson Grayer (EP)  PPM/ICD - Pacemaker: [employer] Device Orders - have magnet available Rep Notified - no rep needed per EP orders  Chest x-ray - 08/18/20 EKG - 02/15/21 at PAT appt Stress Test - 04/08/18 ECHO - 11/18/19 Cardiac Cath - 2006  Sleep Study - 07/10/13 CPAP - pt owns CPAP but does not wear it  DM: Type 2 Pt states she does not check her CBG at home because she does not have a Glucose meter  Blood Thinner Instructions: pt instructed to call surgeon's office for instructions on Eliquis   ERAS Protcol - No   COVID TEST- Pt swabbed at PAT appt today, results pending   Anesthesia review: yes, Hx of CAD  Patient denies shortness of breath, fever, cough and chest pain at PAT appointment   All instructions explained to the patient, with a verbal understanding of the material. Patient agrees to go over the instructions while at home for a better understanding. The opportunity to ask questions was provided.

## 2021-02-16 ENCOUNTER — Encounter: Payer: Self-pay | Admitting: Orthopedic Surgery

## 2021-02-16 NOTE — Anesthesia Preprocedure Evaluation (Addendum)
Anesthesia Evaluation  Patient identified by MRN, date of birth, ID band Patient awake    Reviewed: Allergy & Precautions, NPO status , Patient's Chart, lab work & pertinent test results  History of Anesthesia Complications Negative for: history of anesthetic complications  Airway Mallampati: II  TM Distance: >3 FB Neck ROM: Full    Dental  (+) Edentulous Upper, Edentulous Lower, Upper Dentures   Pulmonary sleep apnea and Continuous Positive Airway Pressure Ventilation , former smoker,  02/15/2021 SARS coronavirus NEG   breath sounds clear to auscultation       Cardiovascular hypertension, Pt. on medications (-) angina+ CAD  + dysrhythmias Atrial Fibrillation + pacemaker  Rhythm:Regular Rate:Normal  '21 ECHO: EF 55-60%, normal LVF, normal RVF, mitral annular calcification, no significant valvular abnormalities  '19 Lexicon: EF 52%, no ischemia    Neuro/Psych  Headaches, Anxiety Depression    GI/Hepatic neg GERD  ,(+) Hepatitis - (NASH)S/p gastric sleeve   Endo/Other  diabetes (glu 172), Oral Hypoglycemic Agents  Renal/GU Renal InsufficiencyRenal disease     Musculoskeletal  (+) Arthritis ,   Abdominal (+) + obese,   Peds  Hematology eliquis   Anesthesia Other Findings   Reproductive/Obstetrics                           Anesthesia Physical Anesthesia Plan  ASA: 3  Anesthesia Plan: General   Post-op Pain Management:    Induction: Intravenous  PONV Risk Score and Plan: 3 and Ondansetron, Dexamethasone and Treatment may vary due to age or medical condition  Airway Management Planned: Oral ETT  Additional Equipment: None  Intra-op Plan:   Post-operative Plan: Extubation in OR  Informed Consent: I have reviewed the patients History and Physical, chart, labs and discussed the procedure including the risks, benefits and alternatives for the proposed anesthesia with the patient or  authorized representative who has indicated his/her understanding and acceptance.       Plan Discussed with: CRNA and Surgeon  Anesthesia Plan Comments: (PAT note by Karoline Caldwell, PA-C: History of permanent atrial fibrillation on Eliquis symptomatic bradycardia s/p Medtronic leadless pacemaker implant 02/08/2020.  Last remote device check 02/09/2021 showed normal function.  Perioperative device instructions below.  DM2, last A1c 7.5 on 01/09/2021.  Preop EKG read states left bundle branch block, however this appears to be ventricular paced rhythm.  Reviewed tracing with cardiology NP Kathyrn Drown who also felt it appeared to be paced rhythm.  Reviewed with anesthesiologist Dr. Tobias Alexander who agreed as well.  Preop labs reviewed, unremarkable.  EKG 02/15/2021: Atrial fibrillation with frequent ventricular-paced complexes.  Rate 60 Left axis deviation. Left bundle branch block.    Perioperative prescription for implanted cardiac device programming per note 02/12/2021: Device Information:  Clinic EP Physician:Dr. Lars Mage  Device Type:Pacemaker Manufacturer and Phone #:Medtronic: [id number] Pacemaker Dependent?:No. Date of Last Device Check:03/11/21 (remote)Normal Device Function?:Yes.  Electrophysiologist's Recommendations:  . Have magnet available. . Provide continuous ECG monitoring when magnet is used or reprogramming is to be performed. . Procedure may interfere with device function. Magnet should be placed over device during procedure.   TTE 11/18/2019: 1. Left ventricular ejection fraction, by estimation, is 55 to 60%. The  left ventricle has normal function. The left ventricle has no regional  wall motion abnormalities. Left ventricular diastolic function could not  be evaluated.  2. Right ventricular systolic function is normal. The right ventricular  size is normal. There is normal pulmonary artery systolic pressure.  3. The mitral  valve is abnormal. Trivial mitral valve regurgitation.  4. The aortic valve is tricuspid. Aortic valve regurgitation is not  visualized.   Lexiscan Cardiolite stress test 04/08/2018 (care everywhere):  1. LV EF was 52% 2. Global left ventricular systolic function was , with an EF of 52%.Negative study for ischemia or infarct.   )      Anesthesia Quick Evaluation

## 2021-02-16 NOTE — Progress Notes (Signed)
Anesthesia Chart Review:  History of permanent atrial fibrillation on Eliquis symptomatic bradycardia s/p Medtronic leadless pacemaker implant 02/08/2020.  Last remote device check 02/09/2021 showed normal function.  Perioperative device instructions below.  DM2, last A1c 7.5 on 01/09/2021.  Preop EKG read states left bundle branch block, however this appears to be ventricular paced rhythm.  Reviewed tracing with cardiology NP Kathyrn Drown who also felt it appeared to be paced rhythm.  Reviewed with anesthesiologist Dr. Tobias Alexander who agreed as well.  Preop labs reviewed, unremarkable.  EKG 02/15/2021: Atrial fibrillation with frequent ventricular-paced complexes.  Rate 60 Left axis deviation. Left bundle branch block.    Perioperative prescription for implanted cardiac device programming per note 02/12/2021: Device Information:   Clinic EP Physician:  Dr. Lars Mage    Device Type:  Pacemaker Manufacturer and Phone #:  Medtronic: [phone] Pacemaker Dependent?:  No. Date of Last Device Check:  03/11/21 (remote)        Normal Device Function?:  Yes.     Electrophysiologist's Recommendations:   Have magnet available. Provide continuous ECG monitoring when magnet is used or reprogramming is to be performed.  Procedure may interfere with device function.  Magnet should be placed over device during procedure.    TTE 11/18/2019:  1. Left ventricular ejection fraction, by estimation, is 55 to 60%. The  left ventricle has normal function. The left ventricle has no regional  wall motion abnormalities. Left ventricular diastolic function could not  be evaluated.   2. Right ventricular systolic function is normal. The right ventricular  size is normal. There is normal pulmonary artery systolic pressure.   3. The mitral valve is abnormal. Trivial mitral valve regurgitation.   4. The aortic valve is tricuspid. Aortic valve regurgitation is not  visualized.   Lexiscan Cardiolite stress  test 04/08/2018 (care everywhere):  1. LV EF was 52% 2. Global left ventricular systolic function was , with an EF of 52%.Negative  study for ischemia or infarct.    Wynonia Musty [facility] Short Stay Center/Anesthesiology Phone [phone] 02/16/2021 2:25 PM

## 2021-02-16 NOTE — Progress Notes (Signed)
Office Visit Note   Patient: [name]           Date of Birth: Dec 07, 1955           MRN: [id number] Visit Date: 02/12/2021 Requested by: Donney Dice, DO [address] [place],  [place] [address] PCP: Donney Dice, DO  Subjective: Chief Complaint  Patient presents with   Right Knee - Follow-up   Left Knee - Follow-up    HPI: [name] is a 65 year old patient with bilateral knee pain and leg pain.  She reports frequent falling and loss of balance.  States the right knee is "giving way".  Does not take any medication for pain.  She has had a fall 3 times last month.  She has a pacemaker and cannot really do MRI scan.  She has had lumbar spine ESI's in the past which have not helped.  She did have a CT scan in October of last year which showed fairly significant spinal stenosis.              ROS: All systems reviewed are negative as they relate to the chief complaint within the history of present illness.  Patient denies  fevers or chills.   Assessment & Plan: Visit Diagnoses:  1. S/P total knee arthroplasty, right   2. Status post left knee replacement   3. Balance problem   4. Spinal stenosis of lumbar region, unspecified whether neurogenic claudication present     Plan: Impression is well-functioning knee replacements with good stability.  I think she is having worsening back symptoms from spinal stenosis.  Plan CT myelogram lumbar spine to evaluate stenosis.  Neurology referral for balance problems.  At the time of this dictation the CT scan has actually come back as positive for worsening spinal stenosis which I think is her main problem.  We will offer her epidural steroid injections or surgical consultation.  Follow-Up Instructions: No follow-ups on file.   Orders:  Orders Placed This Encounter  Procedures   XR Knee 1-2 Views Left   XR Knee 1-2 Views Right   DG MYELOGRAPHY LUMBAR INJ LUMBOSACRAL   CT LUMBAR SPINE W CONTRAST   Ambulatory referral to Neurology   No  orders of the defined types were placed in this encounter.     Procedures: No procedures performed   Clinical Data: No additional findings.  Objective: Vital Signs: There were no vitals taken for this visit.  Physical Exam:   Constitutional: Patient appears well-developed HEENT:  Head: Normocephalic Eyes:EOM are normal Neck: Normal range of motion Cardiovascular: Normal rate Pulmonary/chest: Effort normal Neurologic: Patient is alert Skin: Skin is warm Psychiatric: Patient has normal mood and affect   Ortho Exam: Ortho exam demonstrates palpable pedal pulses with well-functioning knee replacements with range of motion of 0-1 10.  No effusion.  She has good 5 out of 5 ankle dorsiflexion plantarflexion quad hamstring strength and good hip flexion strength.  No groin pain with internal ex rotation of the leg.  No definite paresthesias L1 S1 bilaterally.  No muscle atrophy in the legs.  Specialty Comments:  No specialty comments available.  Imaging: No results found.   PMFS History: Patient Active Problem List   Diagnosis Date Noted   Type 2 DM with diabetic neuropathy affecting both sides of body ([place]) 01/09/2021   Right leg swelling 12/27/2020   Parathyroid adenoma 11/23/2020   Osteoporosis screening 11/16/2020   Memory loss 08/07/2020   Serum calcium elevated 04/12/2020   Hallux  rigidus of left foot 04/12/2020   Hyperparathyroidism ([place]) 04/11/2020   Presbycusis of both ears 04/11/2020   No-show for appointment 03/08/2020   Pacemaker    Hypothyroidism 07/19/2019   Hypertension associated with diabetes ([place]) 07/11/2018   Hyperlipidemia associated with type 2 diabetes mellitus ([place]) 07/11/2018   Short bowel syndrome 07/11/2018   Status post left knee replacement 07/11/2018   Major depression in remission ([place]) 07/07/2018   Coronary artery disease involving native coronary artery of native heart without angina pectoris 07/07/2018   Paroxysmal atrial fibrillation  ([place]) 05/11/2018   Age-related cognitive decline 04/30/2018   Gastroesophageal reflux disease without esophagitis 04/15/2017   B12 deficiency 12/13/2016   Tubular adenoma of colon 03/09/2016   Vitamin D deficiency 03/09/2016   S/P bariatric surgery-duodenal switch with sleeve gastrectomy 12/04/2013   Morbid obesity ([place]) 08/26/2013   Iron deficiency anemia 06/19/2012   Low back pain 03/05/2012   PSORIASIS 05/28/2010   Peripheral neuropathy 01/12/2009   Type 2 diabetes, controlled, with neuropathy ([place]) 02/18/2008   Obesity with sleep apnea 06/12/2007   Past Medical History:  Diagnosis Date   Advanced directives, counseling/discussion 04/12/2020   Allergy    Anemia    ANEMIA, PERNICIOUS, HX OF 05/13/2007   Anxiety    Arthritis    ASYMPTOMATIC POSTMENOPAUSAL STATUS 02/18/2008   B12 deficiency 12/13/2016   BACK PAIN, LUMBAR 11/19/2007   Blood in urine 07/19/2019   Bowel incontinence    Cataract    Cellulitis of left lower leg 10/28/2013   Cellulitis of leg, right 10/11/2010   Chronic depression 10/06/2007   Chronic dermatitis of hands 05/02/2009   Coronary artery disease involving native coronary artery of native heart without angina pectoris 07/07/2018   Last Assessment & Plan:  Formatting of this note might be different from the original. The patient reports remote cardiac catheterization with up to 40% plaque.  Subsequent Cardiolite stress test in October 2019 did not show any evidence of active ischemia or prior infarction. Formatting of this note might be different from the original. medical  Last Assessment & Plan:  Formatting of this note mi   DEPRESSION, CHRONIC 10/06/2007   Diabetes mellitus without complication ([place])    DIABETES MELLITUS, WITH NEUROLOGICAL COMPLICATIONS 02/20/5620   Diabetic neuropathy ([place])    DYSLIPIDEMIA 05/13/2007   Dyslipidemia 04/15/2017   Last Assessment & Plan:  The patient will continue atorvastatin for her dyslipidemia.   Dyslipidemia associated with  type 2 diabetes mellitus ([place]) 07/11/2018   Essential hypertension 03/20/2010   Family history of colon cancer    Fecal incontinence 08/02/2015   Frozen shoulder    Lt   Full dentures    Gastroesophageal reflux disease without esophagitis 04/15/2017   Generalized abdominal pain 02/05/2018   Genetic testing 11/24/2017   Negative genetic testing on the common hereditary cancer panel.  The Hereditary Gene Panel offered by [facility] includes sequencing and/or deletion duplication testing of the following 47 genes: APC, ATM, AXIN2, BARD1, BMPR1A, BRCA1, BRCA2, BRIP1, CDH1, CDK4, CDKN2A (p14ARF), CDKN2A (p16INK4a), CHEK2, CTNNA1, DICER1, EPCAM (Deletion/duplication testing only), GREM1 (promoter region deletion/duplicat   GERD (gastroesophageal reflux disease)    GI bleed 03/15/2016   GOITER, MULTINODULAR 05/13/2007   GOITER, MULTINODULAR 05/13/2007    (last TSH 3.26; US soft tissue neck in 09/2004 showed multinodular goiter with specific small nodule for which was recommended to be followed by repeat US in 3-6 months 12/2016: Thyroid US: recommend repeat in 1 year (one nodule 1.7cm on left thyroid)  Headache    History of blood transfusion    Hx of colonic polyps 12/04/2010   HYPERCHOLESTEROLEMIA 03/20/2010   Hyperparathyroidism ([place]) 04/11/2020   HYPERTENSION 03/20/2010   Hypertension associated with diabetes ([place]) 07/11/2018   Hypothyroidism 07/19/2019   Iron deficiency anemia 06/19/2012   Low back pain 03/05/2012   Completed PT-notes say very motivated and had good progress.   CT (03/2020: Mild curvature convex to the right in the upper lumbar region into left in lower lumbar region. No antero or retrolisthesis in the supine position. retrolisthesis at L3-4 of 3 mm with flexion which increases to 6 mm with neutral and extension. Multifactorial spinal stenosis at L3-4 due to circumferential protrusion of the    Lower back pain    Major depression in remission ([place]) 07/07/2018   Last Assessment & Plan:  Mood  has seemed good on sertraline.   Malabsorption 03/12/2016   NASH (nonalcoholic steatohepatitis)    Nausea 07/19/2019   Need for immunization against influenza 04/12/2020   No-show for appointment 03/08/2020   Nonobstructive CAD  08/26/2013   The patient has prior cardiac workup in 2006 when she underwent a stress test for abnormal EKG. The stress test was nonconclusive and she underwent cardiac catheterization that showed 30% stenosis in the LAD in 25% stenosis in RCA she was treated medically.     OA (osteoarthritis)    OBSTRUCTIVE SLEEP APNEA 06/12/2007   uses CPAP.   Pacemaker    Paroxysmal atrial fibrillation ([place]) 05/11/2018   Last Assessment & Plan:  Patient was noted to have episodes of paroxysmal atrial fibrillation that was predominantly rate controlled and at a relatively low burden.  She is chronically on anticoagulation now with Eliquis.  She does not take any negative chronotropic agents with her sinus bradycardia.  If she has any breakthrough more prolonged tachycardic episodes and requires negative chronotropi   Peripheral autonomic neuropathy due to diabetes mellitus ([place]) 07/11/2018   PERIPHERAL NEUROPATHY 01/12/2009   Peripheral neuropathy 01/12/2009   Peripheral vascular disease ([place])    Presbycusis of both ears 04/11/2020   Primary osteoarthritis involving multiple joints 07/07/2018   Last Assessment & Plan:  Post op left tka and here for rehab as her husband is handicapped so she has limited help at home. Consitpation noted, some pain overnight, better with a pillow under her knee.   Primary osteoarthritis of left knee 04/23/2011   Previously seen by Dr. Alphonzo Severance. Will plan on repeat referral after trial injection.      PSORIASIS 05/28/2010   Rectal prolapse 05/24/2015   Rectovaginal fistula 05/25/2014   Renal insufficiency    stage 1 kd   S/P bariatric surgery-duodenal switch with sleeve gastrectomy 12/04/2013   S/P total knee arthroplasty, right 11/16/2019   Screening  mammogram, encounter for 07/12/2019   Second degree AV block 02/08/2020   Serum calcium elevated 04/12/2020   Short bowel syndrome 07/11/2018   Spigelian hernia 02/05/2018   Status post left knee replacement 07/11/2018   Stress 05/24/2015   Surgical counseling visit 10/02/2019   Tubular adenoma of colon 03/09/2016   Colonoscopy in 10/2015: 3  tubular adenomas; GI recommends repeat colonoscopy in 3 years Colonoscopy August 2012: 3 tubular adenomas.    Type 2 diabetes, controlled, with neuropathy ([place]) 1/96/2229   Umbilical hernia without obstruction and without gangrene 04/15/2017   Unilateral primary osteoarthritis, left knee    UNSPECIFIED VENOUS INSUFFICIENCY 05/28/2010   Patient with frequent ulceration related to venous stasis-seen at wound care. Edema  and Venous stasis changes due to this . Echo 04/16/11 with EF 60%. No signs diastolic dysfunction.        Vaccination against Streptococcus pneumoniae within past 5 years 04/12/2020   Vaginal irritation 07/19/2019   Venous stasis dermatitis of right lower extremity 01/02/2018   Vitamin D deficiency 03/09/2016   Wears glasses     Family History  Problem Relation Age of Onset   Hyperlipidemia Father    Hypertension Father    Cirrhosis Father    Colon cancer Father 38       d. 56   Colon cancer Sister 23       d. 57   Bipolar disorder Sister    Aneurysm Mother        brain   Cerebral aneurysm Mother    Colon cancer Sister 6       d. 58   Bipolar disorder Sister    Cancer Paternal Aunt        NOS, ? colon   Congestive Heart Failure Maternal Grandmother    Drug abuse Neg Hx    CAD Neg Hx    Stomach cancer Neg Hx    Hyperparathyroidism Neg Hx    Hypercalcemia Neg Hx     Past Surgical History:  Procedure Laterality Date   CATARACT EXTRACTION W/ INTRAOCULAR LENS  IMPLANT, BILATERAL     COLONOSCOPY     ELECTROCARDIOGRAM  04/16/2006   EXAMINATION UNDER ANESTHESIA N/A 06/29/2014   Procedure: EXAM UNDER ANESTHESIA;  Surgeon: Janyth Contes, MD;  Location: [facility];  Service: Gynecology;  Laterality: N/A;   Exercise myoview  01/24/2005   FLEXIBLE SIGMOIDOSCOPY     GASTRIC BYPASS  11/09/2013   GASTRIC BYPASS     JOINT REPLACEMENT     KNEE ARTHROSCOPY  2003   right   LAPAROSCOPY N/A 03/12/2016   Procedure: LAPAROSCOPIC ANASTOMOSIS OF INTESTINE (ENTEROENTEROSTOMY);  Surgeon: Ladora Daniel, MD;  Location: [facility];  Service: General;  Laterality: N/A;   LEG SURGERY Left    metal and pins in lower left leg   mrsa Right    arm   MULTIPLE TOOTH EXTRACTIONS     PACEMAKER LEADLESS INSERTION N/A 02/08/2020   Procedure: PACEMAKER LEADLESS INSERTION;  Surgeon: Thompson Grayer, MD;  Location: [facility];  Service: Cardiovascular;  Laterality: N/A;   SHOULDER ARTHROSCOPY W/ ROTATOR CUFF REPAIR Right    stab phlebectomy  Right 02/10/2019   stab phlebectomy > 20 incisions right leg by [name] MD    TONSILLECTOMY     TOTAL KNEE ARTHROPLASTY Left 06/30/2018   Procedure: LEFT TOTAL KNEE ARTHROPLASTY;  Surgeon: Meredith Pel, MD;  Location: [facility];  Service: Orthopedics;  Laterality: Left;   TOTAL KNEE ARTHROPLASTY Right 11/16/2019   Procedure: RIGHT TOTAL KNEE ARTHROPLASTY-CEMENTED;  Surgeon: Meredith Pel, MD;  Location: [facility];  Service: Orthopedics;  Laterality: Right;   VARICOSE VEIN SURGERY     Remotef   Social History   Occupational History   Occupation: Unemployed    Comment: disabled  Tobacco Use   Smoking status: Former    Packs/day: 2.00    Years: 24.00    Pack years: 48.00    Types: Cigarettes    Start date: 06/24/1970    Quit date: 08/06/1994    Years since quitting: 26.5   Smokeless tobacco: Never  Vaping Use   Vaping Use: Never used  Substance and Sexual Activity   Alcohol use: No    Alcohol/week:  0.0 standard drinks   Drug use: No   Sexual activity: Not Currently

## 2021-02-19 ENCOUNTER — Ambulatory Visit (HOSPITAL_COMMUNITY): Payer: Medicare Other

## 2021-02-19 ENCOUNTER — Observation Stay (HOSPITAL_COMMUNITY)
Admission: RE | Admit: 2021-02-19 | Discharge: 2021-02-20 | Disposition: A | Discharge status: Home / Self Care | Payer: Medicare Other | Attending: Otolaryngology | Admitting: Otolaryngology

## 2021-02-19 ENCOUNTER — Ambulatory Visit (HOSPITAL_COMMUNITY): Payer: Medicare Other | Admitting: Vascular Surgery

## 2021-02-19 ENCOUNTER — Encounter (HOSPITAL_COMMUNITY)
Admission: RE | Disposition: A | Discharge status: Home / Self Care | Payer: Self-pay | Source: Home / Self Care | Attending: Otolaryngology

## 2021-02-19 DIAGNOSIS — Z7982 Long term (current) use of aspirin: Secondary | ICD-10-CM | POA: Insufficient documentation

## 2021-02-19 DIAGNOSIS — E213 Hyperparathyroidism, unspecified: Secondary | ICD-10-CM | POA: Diagnosis present

## 2021-02-19 DIAGNOSIS — I10 Essential (primary) hypertension: Secondary | ICD-10-CM | POA: Diagnosis not present

## 2021-02-19 DIAGNOSIS — E119 Type 2 diabetes mellitus without complications: Secondary | ICD-10-CM | POA: Insufficient documentation

## 2021-02-19 DIAGNOSIS — Z7984 Long term (current) use of oral hypoglycemic drugs: Secondary | ICD-10-CM | POA: Diagnosis not present

## 2021-02-19 DIAGNOSIS — Z79899 Other long term (current) drug therapy: Secondary | ICD-10-CM | POA: Diagnosis not present

## 2021-02-19 HISTORY — PX: PARATHYROIDECTOMY: SHX19

## 2021-02-19 LAB — GLUCOSE, CAPILLARY
Glucose-Capillary: 172 mg/dL — ABNORMAL HIGH (ref 70–99)
Glucose-Capillary: 146 mg/dL — ABNORMAL HIGH (ref 70–99)
Glucose-Capillary: 359 mg/dL — ABNORMAL HIGH (ref 70–99)
Glucose-Capillary: 313 mg/dL — ABNORMAL HIGH (ref 70–99)

## 2021-02-19 LAB — HEMOGLOBIN A1C
Hgb A1c MFr Bld: 7.7 % — ABNORMAL HIGH (ref 4.8–5.6)
Mean Plasma Glucose: 174.29 mg/dL

## 2021-02-19 SURGERY — PARATHYROIDECTOMY
Anesthesia: General | Site: Neck | Laterality: Left

## 2021-02-19 MED ORDER — INSULIN ASPART 100 UNIT/ML IJ SOLN
0.0000 [IU] | Freq: Three times a day (TID) | INTRAMUSCULAR | Status: DC
  Administered 2021-02-20: 3 [IU] via SUBCUTANEOUS

## 2021-02-19 MED ORDER — FENTANYL CITRATE (PF) 100 MCG/2ML IJ SOLN
INTRAMUSCULAR | Status: DC | PRN
  Administered 2021-02-19 (×2): 100 ug via INTRAVENOUS

## 2021-02-19 MED ORDER — PHENYLEPHRINE HCL-NACL 20-0.9 MG/250ML-% IV SOLN: INTRAVENOUS | Status: DC | PRN

## 2021-02-19 MED ORDER — INSULIN ASPART 100 UNIT/ML IJ SOLN
0.0000 [IU] | Freq: Three times a day (TID) | INTRAMUSCULAR | Status: DC
  Administered 2021-02-19: 15 [IU] via SUBCUTANEOUS

## 2021-02-19 MED ORDER — ORAL CARE MOUTH RINSE: 15.0000 mL | Freq: Once | OROMUCOSAL | Status: AC

## 2021-02-19 MED ORDER — ROCURONIUM BROMIDE 10 MG/ML (PF) SYRINGE
PREFILLED_SYRINGE | INTRAVENOUS | Status: DC | PRN
  Administered 2021-02-19: 60 mg via INTRAVENOUS

## 2021-02-19 MED ORDER — GABAPENTIN 300 MG PO CAPS
300.0000 mg | ORAL_CAPSULE | Freq: Every day | ORAL | Status: DC
  Administered 2021-02-19: 300 mg via ORAL
  Filled 2021-02-19: qty 1

## 2021-02-19 MED ORDER — HYDROCODONE-ACETAMINOPHEN 7.5-325 MG PO TABS: 1.0000 | ORAL_TABLET | Freq: Four times a day (QID) | ORAL | 0 refills | Status: DC | PRN

## 2021-02-19 MED ORDER — PROMETHAZINE HCL 25 MG/ML IJ SOLN: 6.2500 mg | INTRAMUSCULAR | Status: DC | PRN

## 2021-02-19 MED ORDER — SERTRALINE HCL 50 MG PO TABS
100.0000 mg | ORAL_TABLET | Freq: Every day | ORAL | Status: DC
  Administered 2021-02-19 – 2021-02-20 (×2): 100 mg via ORAL
  Filled 2021-02-19 (×2): qty 2

## 2021-02-19 MED ORDER — AMLODIPINE BESYLATE 5 MG PO TABS
10.0000 mg | ORAL_TABLET | Freq: Every day | ORAL | Status: DC
  Administered 2021-02-19 – 2021-02-20 (×2): 10 mg via ORAL
  Filled 2021-02-19 (×2): qty 2

## 2021-02-19 MED ORDER — OXYCODONE HCL 5 MG/5ML PO SOLN: 5.0000 mg | Freq: Once | ORAL | Status: DC | PRN

## 2021-02-19 MED ORDER — APIXABAN 5 MG PO TABS
5.0000 mg | ORAL_TABLET | Freq: Two times a day (BID) | ORAL | Status: DC
  Filled 2021-02-19: qty 1

## 2021-02-19 MED ORDER — MIDAZOLAM HCL 5 MG/5ML IJ SOLN
INTRAMUSCULAR | Status: DC | PRN
  Administered 2021-02-19: 2 mg via INTRAVENOUS

## 2021-02-19 MED ORDER — TEDUGLUTIDE (RDNA) 5 MG ~~LOC~~ KIT: 5.0000 mg | PACK | Freq: Every day | SUBCUTANEOUS | Status: DC

## 2021-02-19 MED ORDER — MIDAZOLAM HCL 2 MG/2ML IJ SOLN
INTRAMUSCULAR | Status: AC
  Filled 2021-02-19: qty 2

## 2021-02-19 MED ORDER — LISINOPRIL-HYDROCHLOROTHIAZIDE 20-25 MG PO TABS: 1.0000 | ORAL_TABLET | Freq: Every day | ORAL | Status: DC

## 2021-02-19 MED ORDER — KCL-LACTATED RINGERS-D5W 20 MEQ/L IV SOLN
INTRAVENOUS | Status: DC
  Filled 2021-02-19 (×2): qty 1000

## 2021-02-19 MED ORDER — HYDROCODONE-ACETAMINOPHEN 5-325 MG PO TABS
1.0000 | ORAL_TABLET | ORAL | Status: DC | PRN
  Administered 2021-02-19 – 2021-02-20 (×3): 2 via ORAL
  Filled 2021-02-19 (×3): qty 2

## 2021-02-19 MED ORDER — ONDANSETRON 8 MG PO TBDP: 8.0000 mg | ORAL_TABLET | Freq: Three times a day (TID) | ORAL | 0 refills | Status: DC | PRN

## 2021-02-19 MED ORDER — SUGAMMADEX SODIUM 200 MG/2ML IV SOLN
INTRAVENOUS | Status: DC | PRN
  Administered 2021-02-19: 200 mg via INTRAVENOUS

## 2021-02-19 MED ORDER — MEPERIDINE HCL 25 MG/ML IJ SOLN: 6.2500 mg | INTRAMUSCULAR | Status: DC | PRN

## 2021-02-19 MED ORDER — INSULIN ASPART 100 UNIT/ML IJ SOLN
0.0000 [IU] | Freq: Every day | INTRAMUSCULAR | Status: DC
  Administered 2021-02-19: 4 [IU] via SUBCUTANEOUS

## 2021-02-19 MED ORDER — MIDAZOLAM HCL 2 MG/2ML IJ SOLN: 0.5000 mg | Freq: Once | INTRAMUSCULAR | Status: DC | PRN

## 2021-02-19 MED ORDER — LABETALOL HCL 5 MG/ML IV SOLN
5.0000 mg | INTRAVENOUS | Status: DC | PRN
  Administered 2021-02-19 (×3): 5 mg via INTRAVENOUS

## 2021-02-19 MED ORDER — HYDROCHLOROTHIAZIDE 25 MG PO TABS
25.0000 mg | ORAL_TABLET | Freq: Every day | ORAL | Status: DC
  Administered 2021-02-19 – 2021-02-20 (×2): 25 mg via ORAL
  Filled 2021-02-19 (×2): qty 1

## 2021-02-19 MED ORDER — ONDANSETRON HCL 4 MG/2ML IJ SOLN
INTRAMUSCULAR | Status: DC | PRN
Start: 2021-02-19 — End: 2021-02-19
  Administered 2021-02-19: 4 mg via INTRAVENOUS

## 2021-02-19 MED ORDER — PHENYLEPHRINE 40 MCG/ML (10ML) SYRINGE FOR IV PUSH (FOR BLOOD PRESSURE SUPPORT)
PREFILLED_SYRINGE | INTRAVENOUS | Status: DC | PRN
Start: 2021-02-19 — End: 2021-02-19
  Administered 2021-02-19: 80 ug via INTRAVENOUS

## 2021-02-19 MED ORDER — LIDOCAINE-EPINEPHRINE 1 %-1:100000 IJ SOLN
INTRAMUSCULAR | Status: AC
  Filled 2021-02-19: qty 1

## 2021-02-19 MED ORDER — GABAPENTIN 100 MG PO CAPS
100.0000 mg | ORAL_CAPSULE | Freq: Every morning | ORAL | Status: DC
  Administered 2021-02-20: 100 mg via ORAL
  Filled 2021-02-19: qty 1

## 2021-02-19 MED ORDER — CHLORHEXIDINE GLUCONATE 0.12 % MT SOLN
OROMUCOSAL | Status: AC
  Administered 2021-02-19: 15 mL via OROMUCOSAL
  Filled 2021-02-19: qty 15

## 2021-02-19 MED ORDER — ATORVASTATIN CALCIUM 40 MG PO TABS
40.0000 mg | ORAL_TABLET | Freq: Every day | ORAL | Status: DC
  Administered 2021-02-19 – 2021-02-20 (×2): 40 mg via ORAL
  Filled 2021-02-19 (×2): qty 1

## 2021-02-19 MED ORDER — LISINOPRIL 20 MG PO TABS
20.0000 mg | ORAL_TABLET | Freq: Every day | ORAL | Status: DC
  Administered 2021-02-19 – 2021-02-20 (×2): 20 mg via ORAL
  Filled 2021-02-19 (×2): qty 1

## 2021-02-19 MED ORDER — ONDANSETRON HCL 4 MG/2ML IJ SOLN: 4.0000 mg | INTRAMUSCULAR | Status: DC | PRN

## 2021-02-19 MED ORDER — OXYCODONE HCL 5 MG PO TABS: 5.0000 mg | ORAL_TABLET | Freq: Once | ORAL | Status: DC | PRN

## 2021-02-19 MED ORDER — LIDOCAINE 2% (20 MG/ML) 5 ML SYRINGE
INTRAMUSCULAR | Status: DC | PRN
Start: 2021-02-19 — End: 2021-02-19
  Administered 2021-02-19: 20 mg via INTRAVENOUS

## 2021-02-19 MED ORDER — CHLORHEXIDINE GLUCONATE 0.12 % MT SOLN: 15.0000 mL | Freq: Once | OROMUCOSAL | Status: AC

## 2021-02-19 MED ORDER — METFORMIN HCL 500 MG PO TABS
1000.0000 mg | ORAL_TABLET | Freq: Every day | ORAL | Status: DC
  Administered 2021-02-20: 1000 mg via ORAL
  Filled 2021-02-19: qty 2

## 2021-02-19 MED ORDER — DEXAMETHASONE SODIUM PHOSPHATE 10 MG/ML IJ SOLN
INTRAMUSCULAR | Status: DC | PRN
  Administered 2021-02-19: 4 mg via INTRAVENOUS

## 2021-02-19 MED ORDER — ONDANSETRON HCL 4 MG PO TABS: 4.0000 mg | ORAL_TABLET | ORAL | Status: DC | PRN

## 2021-02-19 MED ORDER — FENTANYL CITRATE (PF) 250 MCG/5ML IJ SOLN
INTRAMUSCULAR | Status: AC
  Filled 2021-02-19: qty 5

## 2021-02-19 MED ORDER — IBUPROFEN 100 MG/5ML PO SUSP
400.0000 mg | Freq: Four times a day (QID) | ORAL | Status: DC | PRN
  Administered 2021-02-19: 400 mg via ORAL
  Filled 2021-02-19 (×2): qty 20

## 2021-02-19 MED ORDER — LABETALOL HCL 5 MG/ML IV SOLN
INTRAVENOUS | Status: AC
  Filled 2021-02-19: qty 4

## 2021-02-19 MED ORDER — LACTATED RINGERS IV SOLN: INTRAVENOUS | Status: DC

## 2021-02-19 MED ORDER — PROPOFOL 10 MG/ML IV BOLUS
INTRAVENOUS | Status: DC | PRN
  Administered 2021-02-19: 80 mg via INTRAVENOUS

## 2021-02-19 MED ORDER — HYDROMORPHONE HCL 1 MG/ML IJ SOLN: 0.2500 mg | INTRAMUSCULAR | Status: DC | PRN

## 2021-02-19 MED ORDER — 0.9 % SODIUM CHLORIDE (POUR BTL) OPTIME
TOPICAL | Status: DC | PRN
  Administered 2021-02-19: 1000 mL

## 2021-02-19 SURGICAL SUPPLY — 41 items
ADH SKN CLS APL DERMABOND .7 (GAUZE/BANDAGES/DRESSINGS) ×1
BAG COUNTER SPONGE SURGICOUNT (BAG) ×2 IMPLANT
BAG SPNG CNTER NS LX DISP (BAG) ×1
BLADE SURG 15 STRL LF DISP TIS (BLADE) IMPLANT
BLADE SURG 15 STRL SS (BLADE)
CANISTER SUCT 3000ML PPV (MISCELLANEOUS) ×2 IMPLANT
CLEANER TIP ELECTROSURG 2X2 (MISCELLANEOUS) ×2 IMPLANT
CNTNR URN SCR LID CUP LEK RST (MISCELLANEOUS) ×1 IMPLANT
CONT SPEC 4OZ STRL OR WHT (MISCELLANEOUS) ×2
CORD BIPOLAR FORCEPS 12FT (ELECTRODE) ×2 IMPLANT
COVER SURGICAL LIGHT HANDLE (MISCELLANEOUS) ×2 IMPLANT
DERMABOND ADVANCED (GAUZE/BANDAGES/DRESSINGS) ×1
DERMABOND ADVANCED .7 DNX12 (GAUZE/BANDAGES/DRESSINGS) ×1 IMPLANT
DRAIN HEMOVAC 7FR (DRAIN) IMPLANT
DRAIN SNY 10 ROU (WOUND CARE) IMPLANT
DRAPE HALF SHEET 40X57 (DRAPES) IMPLANT
ELECT COATED BLADE 2.86 ST (ELECTRODE) ×2 IMPLANT
ELECT REM PT RETURN 9FT ADLT (ELECTROSURGICAL) ×2
ELECTRODE REM PT RTRN 9FT ADLT (ELECTROSURGICAL) ×1 IMPLANT
EVACUATOR SILICONE 100CC (DRAIN) ×2 IMPLANT
FORCEPS BIPOLAR SPETZLER 8 1.0 (NEUROSURGERY SUPPLIES) ×2 IMPLANT
GAUZE 4X4 16PLY ~~LOC~~+RFID DBL (SPONGE) IMPLANT
GLOVE SURG ENC MOIS LTX SZ6.5 (GLOVE) IMPLANT
GLOVE SURG LTX SZ7.5 (GLOVE) ×2 IMPLANT
GOWN STRL REUS W/ TWL LRG LVL3 (GOWN DISPOSABLE) ×2 IMPLANT
GOWN STRL REUS W/TWL LRG LVL3 (GOWN DISPOSABLE) ×4
KIT BASIN OR (CUSTOM PROCEDURE TRAY) ×2 IMPLANT
KIT TURNOVER KIT B (KITS) ×2 IMPLANT
NDL PRECISIONGLIDE 27X1.5 (NEEDLE) ×1 IMPLANT
NEEDLE PRECISIONGLIDE 27X1.5 (NEEDLE) ×2 IMPLANT
NS IRRIG 1000ML POUR BTL (IV SOLUTION) ×2 IMPLANT
PAD ARMBOARD 7.5X6 YLW CONV (MISCELLANEOUS) ×4 IMPLANT
PENCIL FOOT CONTROL (ELECTRODE) ×2 IMPLANT
SHEARS HARMONIC 9CM CVD (BLADE) ×2 IMPLANT
STAPLER VISISTAT 35W (STAPLE) ×2 IMPLANT
SUT CHROMIC 4 0 PS 2 18 (SUTURE) ×6 IMPLANT
SUT ETHILON 3 0 PS 1 (SUTURE) ×2 IMPLANT
SUT SILK 3 0 REEL (SUTURE) IMPLANT
SUT SILK 4 0 REEL (SUTURE) ×2 IMPLANT
TOWEL GREEN STERILE FF (TOWEL DISPOSABLE) ×2 IMPLANT
TRAY ENT MC OR (CUSTOM PROCEDURE TRAY) ×2 IMPLANT

## 2021-02-19 NOTE — Transfer of Care (Signed)
Immediate Anesthesia Transfer of Care Note  Patient: [name]  Procedure(s) Performed: LEFT PARATHYROIDECTOMY WITH FROZEN SECTION (Left: Neck)  Patient Location: PACU  Anesthesia Type:General  Level of Consciousness: awake and unresponsive  Airway & Oxygen Therapy: Patient Spontanous Breathing  Post-op Assessment: Report given to RN and Post -op Vital signs reviewed and stable  Post vital signs: Reviewed and stable  Last Vitals:  Vitals Value Taken Time  BP 185/73 02/19/21 1225  Temp    Pulse 68 02/19/21 1225  Resp 16 02/19/21 1225  SpO2 96 % 02/19/21 1225  Vitals shown include unvalidated device data.  Last Pain:  Vitals:   02/19/21 0839  TempSrc:   PainSc: 0-No pain      Patients Stated Pain Goal: 1 (81/15/72 6203)  Complications: No notable events documented.

## 2021-02-19 NOTE — Op Note (Signed)
OPERATIVE REPORT  DATE OF SURGERY: 02/19/2021  PATIENT:  [name],  65 y.o. female  PRE-OPERATIVE DIAGNOSIS:  Hyperparathyroidism; [place]  POST-OPERATIVE DIAGNOSIS:  Hyperparathyroidism; [place]  PROCEDURE:  Procedure(s): LEFT PARATHYROIDECTOMY WITH FROZEN SECTION  SURGEON:  Beckie Salts, MD  ASSISTANTS: RNFA  ANESTHESIA:   General   EBL: 20 ml  DRAINS: None  LOCAL MEDICATIONS USED:  None  SPECIMEN: Left inferior parathyroid, frozen section consistent with parathyroid adenoma  COUNTS:  Correct  PROCEDURE DETAILS: The patient was taken to the operating room and placed on the operating table in the supine position. Following induction of general endotracheal anesthesia, the neck was prepped and draped in a standard fashion.  A transverse incision was outlined with a marking pen just below the order of the clavicles.  Electrocautery was used to incise the skin and subcutaneous tissue.  The platysma layer was divided.  Dissection was continued down to the strap muscles.  The left strap muscles were reflected off the left part of the thyroid gland.  Sister when the left lobe of the thyroid was exposed inspection just inferior to the gland revealed what appeared to be a putative enlarged parathyroid adenoma.  It was oval in shape and measures approximately 1.3 cm in greatest dimension.  This was dissected free of surrounding tissue.  Care was taken to avoid the recurrent nerve that was found just posterior.  The vascular fever to the clamp was coagulated with the bipolar cautery.  The gland was removed and sent for frozen section analysis.  The wound was irrigated with saline and hemostasis was completed.  Wound was closed in layers using interrupted 4-0 chromic on the strap muscles, the platysma layer and a subcuticular running closure.  Dermabond was used on the skin.  Patient was awakened extubated and transferred to recovery in stable condition.    PATIENT DISPOSITION:  To PACU,  stable

## 2021-02-19 NOTE — Anesthesia Postprocedure Evaluation (Signed)
Anesthesia Post Note  Patient: [name]  Procedure(s) Performed: LEFT PARATHYROIDECTOMY WITH FROZEN SECTION (Left: Neck)     Patient location during evaluation: PACU Anesthesia Type: General Level of consciousness: awake and alert, patient cooperative and oriented Pain management: pain level controlled Vital Signs Assessment: post-procedure vital signs reviewed and stable Respiratory status: spontaneous breathing, nonlabored ventilation, respiratory function stable and patient connected to nasal cannula oxygen Cardiovascular status: blood pressure returned to baseline and stable Postop Assessment: no apparent nausea or vomiting Anesthetic complications: no   No notable events documented.  Last Vitals:  Vitals:   02/19/21 1310 02/19/21 1325  BP: (!) 179/72 (!) 183/67  Pulse: (!) 54 (!) 53  Resp: 11 14  Temp:    SpO2: 98% 96%    Last Pain:  Vitals:   02/19/21 1255  TempSrc:   PainSc: 0-No pain                 [name],E. Jennie Hannay

## 2021-02-19 NOTE — Anesthesia Procedure Notes (Signed)
Procedure Name: Intubation Date/Time: 02/19/2021 11:22 AM Performed by: Justin Mend, RN Pre-anesthesia Checklist: Patient identified, Emergency Drugs available, Suction available and Patient being monitored Patient Re-evaluated:Patient Re-evaluated prior to induction Oxygen Delivery Method: Circle System Utilized Preoxygenation: Pre-oxygenation with 100% oxygen Induction Type: IV induction Ventilation: Mask ventilation without difficulty and Oral airway inserted - appropriate to patient size Laryngoscope Size: Mac and 3 Grade View: Grade I Tube type: Oral Tube size: 7.0 mm Number of attempts: 1 Airway Equipment and Method: Stylet and Oral airway Placement Confirmation: ETT inserted through vocal cords under direct vision, positive ETCO2 and breath sounds checked- equal and bilateral Secured at: 22 cm Tube secured with: Tape Dental Injury: Teeth and Oropharynx as per pre-operative assessment

## 2021-02-19 NOTE — Interval H&P Note (Signed)
History and Physical Interval Note:  02/19/2021 9:36 AM  [name]  has presented today for surgery, with the diagnosis of Hyperparathyroidism; [place].  The various methods of treatment have been discussed with the patient and family. After consideration of risks, benefits and other options for treatment, the patient has consented to  Procedure(s): LEFT PARATHYROIDECTOMY WITH FROZEN SECTION (Left) as a surgical intervention.  The patient's history has been reviewed, patient examined, no change in status, stable for surgery.  I have reviewed the patient's chart and labs.  Questions were answered to the patient's satisfaction.     Izora Gala

## 2021-02-19 NOTE — Discharge Instructions (Addendum)
You may shower and use soap and water. Do not use any creams, oils or ointment.   Call Your Doctor If Any of These Occur  Redness, drainage, or swelling at the wound.  Temperature greater than 101 degrees. Severe pain not relieved by pain medication. Incision starts to come apart.

## 2021-02-19 NOTE — Progress Notes (Signed)
Postop check  No complaints, minimal pain.  She is able to swallow without difficulty.  On exam the neck looks excellent without any swelling or hematoma.  Voice is normal.  Breathing is clear.  Stable postop.  Anticipate discharge tomorrow.

## 2021-02-20 ENCOUNTER — Encounter (HOSPITAL_COMMUNITY): Payer: Self-pay | Admitting: Otolaryngology

## 2021-02-20 ENCOUNTER — Encounter: Payer: Self-pay | Admitting: Oncology

## 2021-02-20 DIAGNOSIS — E213 Hyperparathyroidism, unspecified: Secondary | ICD-10-CM | POA: Diagnosis not present

## 2021-02-20 LAB — GLUCOSE, CAPILLARY
Glucose-Capillary: 173 mg/dL — ABNORMAL HIGH (ref 70–99)
Glucose-Capillary: 99 mg/dL (ref 70–99)

## 2021-02-20 LAB — SURGICAL PATHOLOGY

## 2021-02-20 NOTE — Discharge Summary (Signed)
Physician Discharge Summary  Patient ID: [name] MRN: [id number] DOB/AGE: 11/19/1955 65 y.o.  Admit date: 02/19/2021 Discharge date: 02/20/2021  Admission Diagnoses:hyperparathyroidism  Discharge Diagnoses:  Active Problems:   Hyperparathyroidism (HCC)   Discharged Condition: good  Hospital Course: no complications  Consults: none  Significant Diagnostic Studies: none  Treatments: surgery: parathyroidectomy  Discharge Exam: Blood pressure 118/64, pulse (!) 51, temperature 97.9 F (36.6 C), temperature source Oral, resp. rate 17, height 5' 8" (1.727 m), weight 108 kg, SpO2 98 %. PHYSICAL EXAM: Awake and alert, breathing and voice normal. Incision excellent.  Disposition: Discharge disposition: 01-Home or Self Care       Discharge Instructions     Diet - low sodium heart healthy   Complete by: As directed    Increase activity slowly   Complete by: As directed    No wound care   Complete by: As directed       Allergies as of 02/20/2021       Reactions   Cephalexin Hives, Rash   Denies Airway involvement   Latex Rash   Neomycin-bacitracin Zn-polymyx Rash   Tape Rash   Latex Prefers paper tape        Medication List     TAKE these medications    amLODipine 10 MG tablet Commonly known as: NORVASC Take 1 tablet (10 mg total) by mouth daily.   apixaban 5 MG Tabs tablet Commonly known as: Eliquis Take 1 tablet (5 mg total) by mouth 2 (two) times daily. Resume with EVENING dose today.   atorvastatin 40 MG tablet Commonly known as: LIPITOR Take 40 mg by mouth daily.   atorvastatin 80 MG tablet Commonly known as: LIPITOR Take 1 tablet (80 mg total) by mouth daily.   blood glucose meter kit and supplies Kit Dispense based on patient and insurance preference. Use up to four times daily as directed.   furosemide 40 MG tablet Commonly known as: LASIX Take 1 tablet (40 mg total) by mouth daily.   gabapentin 300 MG capsule Commonly known as:  NEURONTIN TAKE 1 CAPSULE BY MOUTH THREE TIMES A DAY What changed:  how much to take how to take this when to take this additional instructions   gabapentin 100 MG capsule Commonly known as: NEURONTIN Take 1 capsule (100 mg total) by mouth in the morning. What changed: Another medication with the same name was changed. Make sure you understand how and when to take each.   HYDROcodone-acetaminophen 7.5-325 MG tablet Commonly known as: Norco Take 1 tablet by mouth every 6 (six) hours as needed for moderate pain.   lisinopril-hydrochlorothiazide 20-25 MG tablet Commonly known as: ZESTORETIC Take 1 tablet by mouth daily.   metFORMIN 1000 MG tablet Commonly known as: GLUCOPHAGE Take 1 tablet (1,000 mg total) by mouth daily with breakfast.   methocarbamol 500 MG tablet Commonly known as: ROBAXIN TAKE 1 TABLET BY MOUTH EVERY 6 HOURS AS NEEDED FOR MUSCLE SPASM   ondansetron 8 MG disintegrating tablet Commonly known as: Zofran ODT Take 1 tablet (8 mg total) by mouth every 8 (eight) hours as needed for nausea or vomiting.   Ozempic (0.25 or 0.5 MG/DOSE) 2 MG/1.5ML Sopn Generic drug: Semaglutide(0.25 or 0.5MG/DOS) Inject 0.25 mg into the skin once a week. This is a patient assistance medication. Patient may not be approved and/or have medication. Please ask and verify when performing med review.   sertraline 100 MG tablet Commonly known as: ZOLOFT Take 1 tablet (100 mg total) by mouth daily.     spironolactone 25 MG tablet Commonly known as: ALDACTONE Take 1 tablet (25 mg total) by mouth daily. NEED OV.   Teduglutide (rDNA) 5 MG Kit Inject 5 mg into the skin daily.         Signed:   02/20/2021, 9:37 AM   

## 2021-02-20 NOTE — Progress Notes (Signed)
Patient is discharged from room 3C02 at this time. Alert and in stable condition. IV site d/c'd and instructions read to patient with understanding verbalized and all questions answered. Left unit via wheelchair with all belongings at side.

## 2021-02-21 NOTE — Progress Notes (Signed)
Pt coming to sign application again for novo nordisk. Original app was submitted but missing info.

## 2021-02-21 NOTE — Progress Notes (Signed)
I have tried calling patient several times to discuss with her. No answer. No VM to LM.

## 2021-02-22 NOTE — Progress Notes (Signed)
Submitted application for OZEMPIC 2MG/1.5ML to [facility] for patient assistance.   Phone: [phone]

## 2021-02-27 NOTE — Progress Notes (Signed)
Remote pacemaker transmission.   

## 2021-02-28 ENCOUNTER — Encounter: Payer: Self-pay | Admitting: Neurology

## 2021-03-13 ENCOUNTER — Encounter (HOSPITAL_COMMUNITY): Payer: Self-pay

## 2021-03-13 ENCOUNTER — Other Ambulatory Visit: Payer: Self-pay

## 2021-03-13 ENCOUNTER — Ambulatory Visit (HOSPITAL_COMMUNITY)
Admission: EM | Admit: 2021-03-13 | Discharge: 2021-03-13 | Disposition: A | Discharge status: Home / Self Care | Payer: Medicare Other | Attending: Physician Assistant | Admitting: Physician Assistant

## 2021-03-13 ENCOUNTER — Ambulatory Visit (INDEPENDENT_AMBULATORY_CARE_PROVIDER_SITE_OTHER): Payer: Medicare Other

## 2021-03-13 DIAGNOSIS — M542 Cervicalgia: Secondary | ICD-10-CM

## 2021-03-13 DIAGNOSIS — M62838 Other muscle spasm: Secondary | ICD-10-CM | POA: Diagnosis not present

## 2021-03-13 MED ORDER — KETOROLAC TROMETHAMINE 30 MG/ML IJ SOLN: 30.0000 mg | Freq: Once | INTRAMUSCULAR | Status: DC

## 2021-03-13 MED ORDER — TIZANIDINE HCL 4 MG PO CAPS: 4.0000 mg | ORAL_CAPSULE | Freq: Three times a day (TID) | ORAL | 0 refills | Status: DC | PRN

## 2021-03-13 NOTE — ED Triage Notes (Signed)
Pt is here with a neck spasm that started today, t has not taken anything to relieve discomfort.

## 2021-03-13 NOTE — Discharge Instructions (Addendum)
I believe that you have a muscle spasm.  You can continue over-the-counter medications including Tylenol for symptom relief.  I have called in a muscle relaxer known as tizanidine that you can use 3 times a day.  Do not drive or drink alcohol with taking it.  You should not take methocarbamol/Robaxin with this medication.  Use heat, rest, stretch for additional symptom relief.  If anything worsens and you have numbness or weakness in your hands or pain increases you need to be seen immediately.

## 2021-03-13 NOTE — ED Provider Notes (Signed)
[place]    CSN: [id number] Arrival date & time: 03/13/21  1802      History   Chief Complaint Chief Complaint  Patient presents with   neck spasm    HPI [name] is a 65 y.o. female.   Patient presents today with a several day history of worsening left-sided neck pain.  She denies any known injury or increase activity prior to symptom onset; does report a fall approximately 2 to 3 weeks ago where she landed on her back and injured her neck.  She was not evaluated following this episode.  Reports she felt a twinge of pain several days ago but this became severe several hours ago.  She reports pain is rated 6 on a 0-10 pain scale at rest but increases to 10 with certain movements localized to left neck without radiation, described periodic shooting pains, worse with certain movements, no new factors identified.  She initially had some numbness and tingling in her arm but this is since resolved.  She denies any fever, chest pain, shortness of breath, headache, dizziness.  She does have a history of generative disc disease in her lower back but has not had this diagnosed in her neck.  She denies previous spinal surgery.  She did recently have parathyroid removed reports she is feeling well from this surgery denies ongoing pain.     Past Medical History:  Diagnosis Date   Advanced directives, counseling/discussion 04/12/2020   Allergy    Anemia    ANEMIA, PERNICIOUS, HX OF 05/13/2007   Anxiety    Arthritis    ASYMPTOMATIC POSTMENOPAUSAL STATUS 02/18/2008   B12 deficiency 12/13/2016   BACK PAIN, LUMBAR 11/19/2007   Blood in urine 07/19/2019   Bowel incontinence    Cataract    Cellulitis of left lower leg 10/28/2013   Cellulitis of leg, right 10/11/2010   Chronic depression 10/06/2007   Chronic dermatitis of hands 05/02/2009   Coronary artery disease involving native coronary artery of native heart without angina pectoris 07/07/2018   Last Assessment & Plan:  Formatting of  this note might be different from the original. The patient reports remote cardiac catheterization with up to 40% plaque.  Subsequent Cardiolite stress test in October 2019 did not show any evidence of active ischemia or prior infarction. Formatting of this note might be different from the original. medical  Last Assessment & Plan:  Formatting of this note mi   DEPRESSION, CHRONIC 10/06/2007   Diabetes mellitus without complication ([place])    DIABETES MELLITUS, WITH NEUROLOGICAL COMPLICATIONS 0/62/3762   Diabetic neuropathy ([place])    DYSLIPIDEMIA 05/13/2007   Dyslipidemia 04/15/2017   Last Assessment & Plan:  The patient will continue atorvastatin for her dyslipidemia.   Dyslipidemia associated with type 2 diabetes mellitus ([place]) 07/11/2018   Essential hypertension 03/20/2010   Family history of colon cancer    Fecal incontinence 08/02/2015   Frozen shoulder    Lt   Full dentures    Gastroesophageal reflux disease without esophagitis 04/15/2017   Generalized abdominal pain 02/05/2018   Genetic testing 11/24/2017   Negative genetic testing on the common hereditary cancer panel.  The Hereditary Gene Panel offered by [facility] includes sequencing and/or deletion duplication testing of the following 47 genes: APC, ATM, AXIN2, BARD1, BMPR1A, BRCA1, BRCA2, BRIP1, CDH1, CDK4, CDKN2A (p14ARF), CDKN2A (p16INK4a), CHEK2, CTNNA1, DICER1, EPCAM (Deletion/duplication testing only), GREM1 (promoter region deletion/duplicat   GERD (gastroesophageal reflux disease)    GI bleed 03/15/2016   GOITER,  MULTINODULAR 05/13/2007   GOITER, MULTINODULAR 05/13/2007    (last TSH 3.26; US soft tissue neck in 09/2004 showed multinodular goiter with specific small nodule for which was recommended to be followed by repeat US in 3-6 months 12/2016: Thyroid US: recommend repeat in 1 year (one nodule 1.7cm on left thyroid)    Headache    History of blood transfusion    Hx of colonic polyps 12/04/2010   HYPERCHOLESTEROLEMIA 03/20/2010    Hyperparathyroidism ([place]) 04/11/2020   HYPERTENSION 03/20/2010   Hypertension associated with diabetes ([place]) 07/11/2018   Hypothyroidism 07/19/2019   Iron deficiency anemia 06/19/2012   Low back pain 03/05/2012   Completed PT-notes say very motivated and had good progress.   CT (03/2020: Mild curvature convex to the right in the upper lumbar region into left in lower lumbar region. No antero or retrolisthesis in the supine position. retrolisthesis at L3-4 of 3 mm with flexion which increases to 6 mm with neutral and extension. Multifactorial spinal stenosis at L3-4 due to circumferential protrusion of the    Lower back pain    Major depression in remission ([place]) 07/07/2018   Last Assessment & Plan:  Mood has seemed good on sertraline.   Malabsorption 03/12/2016   NASH (nonalcoholic steatohepatitis)    Nausea 07/19/2019   Need for immunization against influenza 04/12/2020   No-show for appointment 03/08/2020   Nonobstructive CAD  08/26/2013   The patient has prior cardiac workup in 2006 when she underwent a stress test for abnormal EKG. The stress test was nonconclusive and she underwent cardiac catheterization that showed 30% stenosis in the LAD in 25% stenosis in RCA she was treated medically.     OA (osteoarthritis)    OBSTRUCTIVE SLEEP APNEA 06/12/2007   uses CPAP.   Pacemaker    Paroxysmal atrial fibrillation ([place]) 05/11/2018   Last Assessment & Plan:  Patient was noted to have episodes of paroxysmal atrial fibrillation that was predominantly rate controlled and at a relatively low burden.  She is chronically on anticoagulation now with Eliquis.  She does not take any negative chronotropic agents with her sinus bradycardia.  If she has any breakthrough more prolonged tachycardic episodes and requires negative chronotropi   Peripheral autonomic neuropathy due to diabetes mellitus ([place]) 07/11/2018   PERIPHERAL NEUROPATHY 01/12/2009   Peripheral neuropathy 01/12/2009   Peripheral vascular disease ([place])     Presbycusis of both ears 04/11/2020   Primary osteoarthritis involving multiple joints 07/07/2018   Last Assessment & Plan:  Post op left tka and here for rehab as her husband is handicapped so she has limited help at home. Consitpation noted, some pain overnight, better with a pillow under her knee.   Primary osteoarthritis of left knee 04/23/2011   Previously seen by Dr. Alphonzo Severance. Will plan on repeat referral after trial injection.      PSORIASIS 05/28/2010   Rectal prolapse 05/24/2015   Rectovaginal fistula 05/25/2014   Renal insufficiency    stage 1 kd   S/P bariatric surgery-duodenal switch with sleeve gastrectomy 12/04/2013   S/P total knee arthroplasty, right 11/16/2019   Screening mammogram, encounter for 07/12/2019   Second degree AV block 02/08/2020   Serum calcium elevated 04/12/2020   Short bowel syndrome 07/11/2018   Spigelian hernia 02/05/2018   Status post left knee replacement 07/11/2018   Stress 05/24/2015   Surgical counseling visit 10/02/2019   Tubular adenoma of colon 03/09/2016   Colonoscopy in 10/2015: 3  tubular adenomas; GI recommends repeat colonoscopy in 3  years Colonoscopy August 2012: 3 tubular adenomas.    Type 2 diabetes, controlled, with neuropathy ([place]) 11/23/930   Umbilical hernia without obstruction and without gangrene 04/15/2017   Unilateral primary osteoarthritis, left knee    UNSPECIFIED VENOUS INSUFFICIENCY 05/28/2010   Patient with frequent ulceration related to venous stasis-seen at wound care. Edema and Venous stasis changes due to this . Echo 04/16/11 with EF 60%. No signs diastolic dysfunction.        Vaccination against Streptococcus pneumoniae within past 5 years 04/12/2020   Vaginal irritation 07/19/2019   Venous stasis dermatitis of right lower extremity 01/02/2018   Vitamin D deficiency 03/09/2016   Wears glasses     Patient Active Problem List   Diagnosis Date Noted   Type 2 DM with diabetic neuropathy affecting both sides of body ([place])  01/09/2021   Right leg swelling 12/27/2020   Parathyroid adenoma 11/23/2020   Osteoporosis screening 11/16/2020   Memory loss 08/07/2020   Serum calcium elevated 04/12/2020   Hallux rigidus of left foot 04/12/2020   Hyperparathyroidism ([place]) 04/11/2020   Presbycusis of both ears 04/11/2020   No-show for appointment 03/08/2020   Pacemaker    Hypothyroidism 07/19/2019   Hypertension associated with diabetes ([place]) 07/11/2018   Hyperlipidemia associated with type 2 diabetes mellitus ([place]) 07/11/2018   Short bowel syndrome 07/11/2018   Status post left knee replacement 07/11/2018   Major depression in remission ([place]) 07/07/2018   Coronary artery disease involving native coronary artery of native heart without angina pectoris 07/07/2018   Paroxysmal atrial fibrillation ([place]) 05/11/2018   Age-related cognitive decline 04/30/2018   Gastroesophageal reflux disease without esophagitis 04/15/2017   B12 deficiency 12/13/2016   Tubular adenoma of colon 03/09/2016   Vitamin D deficiency 03/09/2016   S/P bariatric surgery-duodenal switch with sleeve gastrectomy 12/04/2013   Morbid obesity ([place]) 08/26/2013   Iron deficiency anemia 06/19/2012   Low back pain 03/05/2012   PSORIASIS 05/28/2010   Peripheral neuropathy 01/12/2009   Type 2 diabetes, controlled, with neuropathy ([place]) 02/18/2008   Obesity with sleep apnea 06/12/2007    Past Surgical History:  Procedure Laterality Date   CATARACT EXTRACTION W/ INTRAOCULAR LENS  IMPLANT, BILATERAL     COLONOSCOPY     ELECTROCARDIOGRAM  04/16/2006   EXAMINATION UNDER ANESTHESIA N/A 06/29/2014   Procedure: EXAM UNDER ANESTHESIA;  Surgeon: Janyth Contes, MD;  Location: [facility];  Service: Gynecology;  Laterality: N/A;   Exercise myoview  01/24/2005   FLEXIBLE SIGMOIDOSCOPY     GASTRIC BYPASS  11/09/2013   GASTRIC BYPASS     JOINT REPLACEMENT     KNEE ARTHROSCOPY  2003   right   LAPAROSCOPY N/A 03/12/2016   Procedure: LAPAROSCOPIC ANASTOMOSIS OF  INTESTINE (ENTEROENTEROSTOMY);  Surgeon: Ladora Daniel, MD;  Location: [facility];  Service: General;  Laterality: N/A;   LEG SURGERY Left    metal and pins in lower left leg   mrsa Right    arm   MULTIPLE TOOTH EXTRACTIONS     PACEMAKER LEADLESS INSERTION N/A 02/08/2020   Procedure: PACEMAKER LEADLESS INSERTION;  Surgeon: Thompson Grayer, MD;  Location: [facility];  Service: Cardiovascular;  Laterality: N/A;   PARATHYROIDECTOMY Left 02/19/2021   Procedure: LEFT PARATHYROIDECTOMY WITH FROZEN SECTION;  Surgeon: Izora Gala, MD;  Location: [facility];  Service: ENT;  Laterality: Left;   SHOULDER ARTHROSCOPY W/ ROTATOR CUFF REPAIR Right    stab phlebectomy  Right 02/10/2019   stab phlebectomy > 20 incisions right leg by [name]  MD    TONSILLECTOMY     TOTAL KNEE ARTHROPLASTY Left 06/30/2018   Procedure: LEFT TOTAL KNEE ARTHROPLASTY;  Surgeon: Meredith Pel, MD;  Location: [facility];  Service: Orthopedics;  Laterality: Left;   TOTAL KNEE ARTHROPLASTY Right 11/16/2019   Procedure: RIGHT TOTAL KNEE ARTHROPLASTY-CEMENTED;  Surgeon: Meredith Pel, MD;  Location: [facility];  Service: Orthopedics;  Laterality: Right;   VARICOSE VEIN SURGERY     Remotef    OB History   No obstetric history on file.      Home Medications    Prior to Admission medications   Medication Sig Start Date End Date Taking? Authorizing Provider  tiZANidine (ZANAFLEX) 4 MG capsule Take 1 capsule (4 mg total) by mouth 3 (three) times daily as needed for muscle spasms. 03/13/21  Yes Huma Imhoff K, PA-C  amLODipine (NORVASC) 10 MG tablet Take 1 tablet (10 mg total) by mouth daily. 10/18/20   Daisy Floro, DO  apixaban (ELIQUIS) 5 MG TABS tablet Take 1 tablet (5 mg total) by mouth 2 (two) times daily. Resume with EVENING dose today. 02/09/20   Shirley Friar, PA-C  atorvastatin (LIPITOR) 40 MG tablet Take 40 mg by mouth daily.    [provider]  atorvastatin (LIPITOR) 80 MG tablet Take 1  tablet (80 mg total) by mouth daily. Patient not taking: No sig reported 11/16/20   Milus Banister C, DO  blood glucose meter kit and supplies KIT Dispense based on patient and insurance preference. Use up to four times daily as directed. 11/16/20   Daisy Floro, DO  furosemide (LASIX) 40 MG tablet Take 1 tablet (40 mg total) by mouth daily. 10/27/20   Daisy Floro, DO  gabapentin (NEURONTIN) 100 MG capsule Take 1 capsule (100 mg total) by mouth in the morning. 11/16/20   Daisy Floro, DO  gabapentin (NEURONTIN) 300 MG capsule TAKE 1 CAPSULE BY MOUTH THREE TIMES A DAY Patient taking differently: Take 300 mg by mouth at bedtime. 10/18/20   Daisy Floro, DO  HYDROcodone-acetaminophen (NORCO) 7.5-325 MG tablet Take 1 tablet by mouth every 6 (six) hours as needed for moderate pain. 02/19/21   Izora Gala, MD  lisinopril-hydrochlorothiazide (ZESTORETIC) 20-25 MG tablet Take 1 tablet by mouth daily.    [provider]  metFORMIN (GLUCOPHAGE) 1000 MG tablet Take 1 tablet (1,000 mg total) by mouth daily with breakfast. 01/19/21   Ganta, Anupa, DO  ondansetron (ZOFRAN ODT) 8 MG disintegrating tablet Take 1 tablet (8 mg total) by mouth every 8 (eight) hours as needed for nausea or vomiting. 02/19/21   Izora Gala, MD  Semaglutide,0.25 or 0.5MG/DOS, (OZEMPIC, 0.25 OR 0.5 MG/DOSE,) 2 MG/1.5ML SOPN Inject 0.25 mg into the skin once a week. This is a patient assistance medication. Patient may not be approved and/or have medication. Please ask and verify when performing med review. Patient not taking: No sig reported 01/19/21   Ganta, Anupa, DO  sertraline (ZOLOFT) 100 MG tablet Take 1 tablet (100 mg total) by mouth daily. 10/16/20   Daisy Floro, DO  spironolactone (ALDACTONE) 25 MG tablet Take 1 tablet (25 mg total) by mouth daily. NEED OV. 02/08/21   Allred, Jeneen Rinks, MD  Teduglutide, rDNA, 5 MG KIT Inject 5 mg into the skin daily.     [provider]    Family  History Family History  Problem Relation Age of Onset   Hyperlipidemia Father    Hypertension Father    Cirrhosis Father  Colon cancer Father 1       d. 105   Colon cancer Sister 28       d. 40   Bipolar disorder Sister    Aneurysm Mother        brain   Cerebral aneurysm Mother    Colon cancer Sister 28       d. 98   Bipolar disorder Sister    Cancer Paternal Aunt        NOS, ? colon   Congestive Heart Failure Maternal Grandmother    Drug abuse Neg Hx    CAD Neg Hx    Stomach cancer Neg Hx    Hyperparathyroidism Neg Hx    Hypercalcemia Neg Hx     Social History Social History   Tobacco Use   Smoking status: Former    Packs/day: 2.00    Years: 24.00    Pack years: 48.00    Types: Cigarettes    Start date: 06/24/1970    Quit date: 08/06/1994    Years since quitting: 26.6   Smokeless tobacco: Never  Vaping Use   Vaping Use: Never used  Substance Use Topics   Alcohol use: No    Alcohol/week: 0.0 standard drinks   Drug use: No     Allergies   Cephalexin, Latex, Neomycin-bacitracin zn-polymyx, and Tape   Review of Systems Review of Systems  Constitutional:  Positive for activity change. Negative for appetite change, fatigue and fever.  Respiratory:  Negative for cough and shortness of breath.   Cardiovascular:  Negative for chest pain.  Gastrointestinal:  Negative for abdominal pain, diarrhea, nausea and vomiting.  Musculoskeletal:  Positive for neck pain and neck stiffness. Negative for arthralgias, back pain and myalgias.  Neurological:  Negative for dizziness, facial asymmetry, speech difficulty, weakness, light-headedness, numbness and headaches.    Physical Exam Triage Vital Signs ED Triage Vitals  Enc Vitals Group     BP 03/13/21 2004 (!) 162/86     Pulse Rate 03/13/21 2004 86     Resp 03/13/21 2004 17     Temp 03/13/21 2004 98.3 F (36.8 C)     Temp Source 03/13/21 2004 Oral     SpO2 03/13/21 2004 98 %     Weight --      Height --      Head  Circumference --      Peak Flow --      Pain Score 03/13/21 2003 2     Pain Loc --      Pain Edu? --      Excl. in [place]? --    No data found.  Updated Vital Signs BP (!) 162/86 (BP Location: Right Arm)   Pulse 86   Temp 98.3 F (36.8 C) (Oral)   Resp 17   SpO2 98%   Visual Acuity Right Eye Distance:   Left Eye Distance:   Bilateral Distance:    Right Eye Near:   Left Eye Near:    Bilateral Near:     Physical Exam Vitals reviewed.  Constitutional:      General: She is awake. She is not in acute distress.    Appearance: Normal appearance. She is well-developed. She is not ill-appearing.     Comments: Very pleasant female appears stated age no acute distress sitting comfortably in exam room  HENT:     Head: Normocephalic and atraumatic.  Cardiovascular:     Rate and Rhythm: Normal rate and regular rhythm.  Heart sounds: Normal heart sounds, S1 normal and S2 normal. No murmur heard. Pulmonary:     Effort: Pulmonary effort is normal.     Breath sounds: Normal breath sounds. No wheezing, rhonchi or rales.     Comments: Clear to auscultation bilaterally Abdominal:     Palpations: Abdomen is soft.     Tenderness: There is no abdominal tenderness.  Musculoskeletal:     Cervical back: Spasms, tenderness and bony tenderness present. Pain with movement present.     Thoracic back: No tenderness or bony tenderness.     Lumbar back: No tenderness or bony tenderness.     Comments: Normal pincer and grip strength.  Strength 5/5 bilateral upper extremities.  Decreased range of motion with rotation, forward flexion, extension of neck.  Pain percussion of C5-C7.  No deformity noted.  Psychiatric:        Behavior: Behavior is cooperative.     UC Treatments / Results  Labs (all labs ordered are listed, but only abnormal results are displayed) Labs Reviewed - No data to display  EKG   Radiology DG Cervical Spine 2-3 Views  Result Date: 03/13/2021 CLINICAL DATA:  Neck and  left arm pain. EXAM: CERVICAL SPINE - 2-3 VIEW COMPARISON:  None. FINDINGS: Advanced degenerative cervical spondylosis with significant multilevel disc disease and facet disease. This is most severe in the lower cervical spine. No acute bony findings or destructive bony changes. No abnormal prevertebral soft tissue swelling. The C1-2 articulations are maintained. The lung apices are clear. There is a moderate size cervical rib noted on the right side. IMPRESSION: 1. Advanced degenerative cervical spondylosis with significant multilevel disc disease and facet disease. 2. No acute bony findings. 3. Right-sided cervical rib. Electronically Signed   By: Marijo Sanes M.D.   On: 03/13/2021 20:39    Procedures Procedures (including critical care time)  Medications Ordered in UC Medications - No data to display  Initial Impression / Assessment and Plan / UC Course  I have reviewed the triage vital signs and the nursing notes.  Pertinent labs & imaging results that were available during my care of the patient were reviewed by me and considered in my medical decision making (see chart for details).      X-rays obtained given bony tenderness on exam in the setting of remote trauma showed degenerative disc disease without acute findings.  Discussed symptoms are likely related to muscle spasm given clinical presentation.  Patient has not been taking methocarbamol so we will switch to tizanidine helps to provide some relief of symptoms.  She was instructed not to take these medications together.  She is currently prescribed Eliquis for anticoagulation so is unable to take NSAIDs; initially discussed Toradol injection but decided not to do this due to blood thinning properties of this medication.  Recommended conservative treatment measures including heat, rest, stretch for additional symptom relief.  Discussed that if she has any worsening symptoms including severe pain, headache, weakness, paresthesias she needs  to be reevaluated.  Recommend she follow-up with her primary care provider within a week.  Discussed she may need physical therapy and/or more advanced imaging if symptoms persist this would need to be arranged through primary care provider.  Strict return precautions given to which she expressed understanding.  Final Clinical Impressions(s) / UC Diagnoses   Final diagnoses:  Neck muscle spasm  Neck pain     Discharge Instructions      I believe that you have a muscle spasm.  You  can continue over-the-counter medications including Tylenol for symptom relief.  I have called in a muscle relaxer known as tizanidine that you can use 3 times a day.  Do not drive or drink alcohol with taking it.  You should not take methocarbamol/Robaxin with this medication.  Use heat, rest, stretch for additional symptom relief.  If anything worsens and you have numbness or weakness in your hands or pain increases you need to be seen immediately.     ED Prescriptions     Medication Sig Dispense Auth. Provider   tiZANidine (ZANAFLEX) 4 MG capsule Take 1 capsule (4 mg total) by mouth 3 (three) times daily as needed for muscle spasms. 30 capsule Takeesha Isley K, PA-C      I have reviewed the PDMP during this encounter.   Terrilee Croak, PA-C 03/13/21 2125

## 2021-03-16 ENCOUNTER — Other Ambulatory Visit: Payer: Self-pay

## 2021-03-16 ENCOUNTER — Ambulatory Visit (INDEPENDENT_AMBULATORY_CARE_PROVIDER_SITE_OTHER): Payer: Medicare Other | Admitting: Family Medicine

## 2021-03-16 VITALS — BP 140/78 | HR 67 | Ht 68.0 in | Wt 242.2 lb

## 2021-03-16 DIAGNOSIS — M62838 Other muscle spasm: Secondary | ICD-10-CM | POA: Diagnosis not present

## 2021-03-16 DIAGNOSIS — Z23 Encounter for immunization: Secondary | ICD-10-CM

## 2021-03-16 MED ORDER — DICLOFENAC SODIUM 1 % EX GEL: 2.0000 g | Freq: Four times a day (QID) | CUTANEOUS | 3 refills | Status: DC

## 2021-03-16 NOTE — Progress Notes (Signed)
    SUBJECTIVE:   CHIEF COMPLAINT / HPI:   Neck pain  Patient reports that she had severe neck pain which started on her left side on 9/20.  She went to the emergency department and they gave her tizanidine for muscle spasms and the pain began to slightly improved.  She started walking with a walker because of the neck pain and all of a sudden yesterday the right side of her neck began to hurt extremely bad.  She was unable to turn her head right.  The pain radiated down into her right shoulder.  Denies any weakness but is limited by pain.  No traumatic injury noted.  OBJECTIVE:   BP 140/78   Pulse 67   Ht 5' 8"  (1.727 m)   Wt 242 lb 3.2 oz (109.9 kg)   SpO2 97%   BMI 36.83 kg/m   General: Well-appearing 65 year old female Respiratory: Speaking in full sentences, no shortness of breath MSK: Patient has limited range of motion of right upper extremity with tenderness to palpation along trapezius from insertion to the shoulder.  No weakness in grip strength.   ASSESSMENT/PLAN:   Neck muscle spasm Patient reports 3-day history of neck spasms which has been improved by the tizanidine.  Believe that the worsening in the right side is due to the patient started use a cane which she predominantly used in her right hand and put considerable weight on.  Discussed supportive measures such as Tylenol arthritic pain.  Sent prescription for topical medication to patient's pharmacy.  If symptoms persist may benefit from sports medicine referral.  No further questions or concerns at this time.  Discussed strict ED and return precautions.     Gifford Shave, MD [place]

## 2021-03-16 NOTE — Patient Instructions (Signed)
It was wonderful seeing you today.  I am sorry you are having this neck pain.  It seems muscular in origin.  I want you to continue taking the tizanidine as prescribed.  I also want you to use heat and ice.  I have sent a prescription for Voltaren gel to your pharmacy which she can apply to your neck.  You can also take over-the-counter Tylenol arthritic pain.  If your symptoms worsen please let us know.  I hope you have a wonderful afternoon!

## 2021-03-17 DIAGNOSIS — M62838 Other muscle spasm: Secondary | ICD-10-CM | POA: Insufficient documentation

## 2021-03-17 NOTE — Assessment & Plan Note (Signed)
Patient reports 3-day history of neck spasms which has been improved by the tizanidine.  Believe that the worsening in the right side is due to the patient started use a cane which she predominantly used in her right hand and put considerable weight on.  Discussed supportive measures such as Tylenol arthritic pain.  Sent prescription for topical medication to patient's pharmacy.  If symptoms persist may benefit from sports medicine referral.  No further questions or concerns at this time.  Discussed strict ED and return precautions.

## 2021-04-20 ENCOUNTER — Telehealth: Payer: Self-pay

## 2021-04-20 NOTE — Progress Notes (Signed)
Received notification from [place] regarding approval for [place]. Patient assistance approved until 06/23/21.  Phone: [phone]

## 2021-04-20 NOTE — Telephone Encounter (Signed)
Called pt regarding medication pickup for ozempic.   2 boxes are labeled and ready in med room fridge.   Pt is starting with 0.57m weekly for 4 weeks and then increasing to 0.574m  Please still counsel pt on medication. She also has questions about still taking metformin while on the ozempic.

## 2021-04-25 ENCOUNTER — Other Ambulatory Visit (HOSPITAL_COMMUNITY): Payer: Self-pay

## 2021-04-25 ENCOUNTER — Other Ambulatory Visit: Payer: Self-pay

## 2021-04-25 ENCOUNTER — Encounter: Payer: Self-pay | Admitting: Oncology

## 2021-04-25 ENCOUNTER — Ambulatory Visit (INDEPENDENT_AMBULATORY_CARE_PROVIDER_SITE_OTHER): Payer: Medicare Other | Admitting: Pharmacist

## 2021-04-25 DIAGNOSIS — E1142 Type 2 diabetes mellitus with diabetic polyneuropathy: Secondary | ICD-10-CM

## 2021-04-25 DIAGNOSIS — E1169 Type 2 diabetes mellitus with other specified complication: Secondary | ICD-10-CM

## 2021-04-25 DIAGNOSIS — E785 Hyperlipidemia, unspecified: Secondary | ICD-10-CM

## 2021-04-25 MED ORDER — ACCU-CHEK GUIDE W/DEVICE KIT
PACK | 0 refills | Status: DC
  Filled 2021-04-25: qty 1, fill #0

## 2021-04-25 MED ORDER — APIXABAN 5 MG PO TABS
5.0000 mg | ORAL_TABLET | Freq: Two times a day (BID) | ORAL | 0 refills | Status: DC
  Filled 2021-04-25: qty 60, 30d supply, fill #0

## 2021-04-25 MED ORDER — ACCU-CHEK SOFTCLIX LANCETS MISC
0 refills | Status: DC
  Filled 2021-04-25: qty 100, fill #0

## 2021-04-25 MED ORDER — ACCU-CHEK AVIVA PLUS VI STRP: ORAL_STRIP | 0 refills | Status: DC

## 2021-04-25 MED ORDER — ACCU-CHEK SOFTCLIX LANCETS MISC: 0 refills | Status: DC

## 2021-04-25 MED ORDER — ACCU-CHEK AVIVA PLUS VI STRP
ORAL_STRIP | 1 refills | Status: DC
  Filled 2021-04-25 (×2): qty 100, 90d supply, fill #0

## 2021-04-25 MED ORDER — ACCU-CHEK GUIDE W/DEVICE KIT
PACK | 0 refills | Status: DC
Start: 2021-04-25 — End: 2021-08-28

## 2021-04-25 NOTE — Patient Instructions (Addendum)
[name] it was a pleasure seeing you today.   Please do the following:  Start Ozempic 0.61m once weekly as directed today during your appointment. If you have any questions or if you believe something has occurred because of this change, call me or your doctor to let one of [email or web address].  Continue checking blood sugars at home. It's really important that you record these and bring these in to your next doctor's appointment.  Continue making the lifestyle changes we've discussed together during our visit. Diet and exercise play a significant role in improving your blood sugars.  Follow-up with me in four weeks.  Hypoglycemia or low blood sugar:   Low blood sugar can happen quickly and may become an emergency if not treated right away.   While this shouldn't happen often, it can be brought upon if you skip a meal or do not eat enough. Also, if your insulin or other diabetes medications are dosed too high, this can cause your blood sugar to go to low.   Warning signs of low blood sugar include: Feeling shaky or dizzy Feeling weak or tired  Excessive hunger Feeling anxious or upset  Sweating even when you aren't exercising  What to do if I experience low blood sugar? Follow the Rule of 15 Check your blood sugar with your meter. If lower than 70, proceed to step 2.  Treat with 15 grams of fast acting carbs which is found in 3-4 glucose tablets. If none are available you can try hard candy, 1 tablespoon of sugar or honey,4 ounces of fruit juice, or 6 ounces of REGULAR soda.  Re-check your sugar in 15 minutes. If it is still below 70, do what you did in step 2 again. If your blood sugar has come back up, go ahead and eat a snack or small meal made up of complex carbs (ex. Whole grains) and protein at this time to avoid recurrence of low blood sugar.   Diet Recommendations for Diabetes  Carbohydrate includes starch, sugar, and fiber.  Of these, only sugar and starch raise blood glucose.  (Fiber  is found in fruits, vegetables [especially skin, seeds, and stalks], whole grains, and beans.)   Starchy (carb) foods: Bread, rice, pasta, potatoes, corn, cereal, grits, crackers, bagels, muffins, all baked goods.  (Fruit, milk, and yogurt also have carbohydrate, but most of these foods will not spike your blood sugar as most starchy or sweet foods will.)  A few fruits do cause high blood sugars; use small portions of bananas (limit to 1/2 at a time), grapes, watermelon, and oranges.   Protein foods: Meat, fish, poultry, eggs, dairy foods, and beans such as pinto and kidney beans (beans also provide carbohydrate).   1. Eat at least 3 REAL meals and 1-2 snacks per day. Eat breakfast within the first hour of getting up.  Have something to eat at least every 5 hours while awake.   2. Limit starchy foods to TWO per meal and ONE per snack. ONE portion of a starchy food is equal to the following:   - ONE slice of bread (or its equivalent, such as half of a hamburger bun).   - 1/2 cup of a "scoopable" starchy food such as potatoes or rice.   - 15 grams of Total Carbohydrate as shown on food label.   - Every 4 ounces of a sweet drink (including fruit juice). 3. Include twice the volume of vegetables as protein or carbohydrate foods for BOTH lunch and  dinner as often as you can.   - Fresh or frozen vegetables are best.   - Keep frozen vegetables on hand for a quick option.       For more information visit [email or web address]

## 2021-04-25 NOTE — Assessment & Plan Note (Signed)
ASCVD risk - secondary prevention in patient with diabetes. Last LDL is not controlled. Patient will bring in prescriptions to office visit on 11/16 with PCP to verify correct dose of statin. Statin may be causing myalgias patient is reporting. Will defer to PCP to discuss at office visit and can consider changing to rosuvastatin or decreasing dose if workup points towards statin.  1. Continued atorvastatin (dose unknown)

## 2021-04-25 NOTE — Assessment & Plan Note (Signed)
  T2DM is mostly controlled based on recent A1C but patient has no glucometer to check readings. Medication adherence appears sub-optimal, although improved. Supplied patient with a pill organizer to help. Patient educated on purpose, proper use and potential adverse effects of Ozempic.  Following instruction patient verbalized understanding of treatment plan.    1. Started GLP-1 Ozempic 0.10m once weekly 2. Continued metformin 1005mdaily 3. Extensively discussed pathophysiology of diabetes, dietary effects on blood sugar control, and recommended lifestyle interventions.  4. Counseled on s/sx of and management of hypoglycemia 5. Next A1C anticipated November 2022.

## 2021-04-25 NOTE — Assessment & Plan Note (Signed)
>>  ASSESSMENT AND PLAN FOR TYPE 2 DM WITH DIABETIC NEUROPATHY AFFECTING BOTH SIDES OF BODY ([facility]) WRITTEN ON 04/25/2021 11:12 AM BY KELLEY, RACHELLE, RPH-CPP   T2DM is mostly controlled based on recent A1C but patient has no glucometer to check readings. Medication adherence appears sub-optimal, although improved. Supplied patient with a pill organizer to help. Patient educated on purpose, proper use and potential adverse effects of Ozempic .  Following instruction patient verbalized understanding of treatment plan.    Started GLP-1 Ozempic  0.25mg  once weekly Continued metformin  1000mg  daily Extensively discussed pathophysiology of diabetes, dietary effects on blood sugar control, and recommended lifestyle interventions.  Counseled on s/sx of and management of hypoglycemia Next A1C anticipated November 2022.

## 2021-04-25 NOTE — Progress Notes (Signed)
Subjective:    Patient ID: [name], female    DOB: [birth year], 65 y.o.   MRN: [id number]  HPI Patient is a 65 y.o. female who presents for diabetes management and Ozempic teaching. She is in good spirits and presents without assistance. Patient was referred on 01/09/21 and last seen by provider, Dr. Caron Presume, on 03/16/21. Last seen in pharmacy clinic on 01/19/21.  Patient reports feeling fatigued but she is not sure if that is due to the medicine or because she has to take care of herself, her husband, and their house. She also states she has been having leg cramps on and off bilaterally (but usually worse in right leg).  Patient reports diabetes was diagnosed around 26 years ago.  Insurance coverage/medication affordability: Medicare A/B  Family/Social history: patient's husband is wheelchair bound due to MS  Current diabetes medications include: metformin 1039m Current hypertension medications include: amlodipine 139m lisinopril-HCTZ 20-2570mspironolactone 65m59mrrent hyperlipidemia medications include: atorvastatin (unclear of strength) Patient states that She is taking her medications as prescribed. Patient denies adherence with medications. Patient states that She misses her medications 1-2 times per week, on average. Patient reports improvement in missed doses since our visit in July in which we set alarms to help the patient remember to take her medications  Do you feel that your medications are working for you?  "I don't feel like the sertraline is doing anything"  Have you been experiencing any side effects to the medications prescribed? no  Do you have any problems obtaining medications due to transportation or finances?  Yes; Patient had no refills on eliquis and pharmacy stated they would contact prescriber but patient never heard anything back   Patient reported dietary habits:  Patient reports that she likes vegetables and tries to eat healthy overall but struggles  with late night snacking. She requests information regarding snacking.  Patient-reported exercise habits: patient recently bought an exercise bike and has been using that around 30 minutes a few times a day.   Patient reports hypoglycemic events of feeling dizzy but unclear if this is related to blood glucose as she does not have a glucometer. Patient denies polyuria (increased urination).   Objective:   Labs:   Physical Exam Neurological:     Mental Status: She is alert and oriented to person, place, and time.    Review of Systems  Gastrointestinal:  Negative for abdominal pain, diarrhea, nausea and vomiting.   Lab Results  Component Value Date   HGBA1C 7.7 (H) 02/19/2021   HGBA1C 7.5 (A) 01/09/2021   HGBA1C 7.7 (A) 11/16/2020   Last Weight  Most recent update: 04/25/2021  9:37 AM    Weight  110.3 kg (243 lb 3.2 oz)              Lab Results  Component Value Date   MICRALBCREAT 55.0 (H) 10/30/2017    Lipid Panel     Component Value Date/Time   CHOL 185 11/16/2020 1216   CHOL 189 08/28/2016 0000   TRIG 84 11/16/2020 1216   TRIG 63 08/28/2016 0000   TRIG 184 (H) 04/09/2006 0904   HDL 58 11/16/2020 1216   HDL 75 08/28/2016 0000   CHOLHDL 3.2 11/16/2020 1216   CHOLHDL 2.52 08/28/2016 0000   CHOLHDL 4.5 06/19/2012 1125   VLDL 61 (H) 06/19/2012 1125   LDLCALC 112 (H) 11/16/2020 1216   LDLDIRECT 113 (H) 07/05/2013 1512   The 10-year ASCVD risk score (Arnett DK, et al., 2019) is:  17.2%   Values used to calculate the score:     Age: 78 years     Sex: Female     Is Non-Hispanic African American: No     Diabetic: Yes     Tobacco smoker: No     Systolic Blood Pressure: 542 mmHg     Is BP treated: Yes     HDL Cholesterol: 58 mg/dL     Total Cholesterol: 185 mg/dL    Assessment/Plan:   T2DM is mostly controlled based on recent A1C but patient has no glucometer to check readings. Medication adherence appears sub-optimal, although improved. Supplied patient  with a pill organizer to help. Patient educated on purpose, proper use and potential adverse effects of Ozempic.  Following instruction patient verbalized understanding of treatment plan.    Started GLP-1 Ozempic 0.64m once weekly Continued metformin 10079mdaily Extensively discussed pathophysiology of diabetes, dietary effects on blood sugar control, and recommended lifestyle interventions.  Counseled on s/sx of and management of hypoglycemia Next A1C anticipated November 2022.   ASCVD risk - secondary prevention in patient with diabetes. Last LDL is not controlled. Patient will bring in prescriptions to office visit on 11/16 with PCP to verify correct dose of statin. Statin may be causing myalgias patient is reporting. Will defer to PCP to discuss at office visit and can consider changing to rosuvastatin or decreasing dose if workup points towards statin.  Continued atorvastatin (dose unknown)  Refill sent in for 30 day supply of Eliquis due to patient being out and having upcoming appointment with Cardiology office who was previously prescribing medication. Instructed patient to follow-up with cardiology office for further refills at next appointment.  Follow-up appointment 4 weeks to titrate Ozempic. Written patient instructions provided.  This appointment required 90 minutes of direct patient care.  Thank you for involving pharmacy to assist in providing this patient's care.

## 2021-04-25 NOTE — Progress Notes (Deleted)
   Subjective:    Patient ID: [name], female    DOB: [birth year], 64 y.o.   MRN: [id number]  HPI  Patient is a 65 y.o. female who presents for medication review and management. She is in good spirits and presents without assistance. Patient was referred and last seen by Primary Care Provider on 01/09/21.  Patient had gastric bypass in 2015 and has difficulty swallowing large pills since then. She has not taken metformin in quite some time because of this issue.   Medication Adherence Questionnaire (A score of 2 or more points indicates risk for nonadherence)  Do you know what each of your medicines is for? 1 (1 point if no)  Do you ever have trouble remembering to take your medicine? 2 (2 points if yes)  Do you ever not take a medicine because you feel you do not need it?  0  (1 point if yes)  Do you think that any of your medicines is not helping you? 1 "sertraline is not helping my mood" (1 point if yes)  Do you have any physical problems such as vision loss that keep you from taking your medicines as prescribed?  0 (2 points if yes)  Do you think any of your medicine is causing a side effect?  0 (1 point if yes)  Do you know the names of ALL of your medicines? 1 (1 point if no)  Do you think that you need ALL of your medicines? 1: "I don't think I need the mood medicine because it isn't helping" (1 point if no)  In the past 6 months, have you missed getting a refill or a new prescription filled on time?  0 (1 point if yes)  How often do you miss taking a dose of medicine?  2 Never (0 points), 1 or 2 times a month (1 points), 1 time a week (2 points), 2 or more times a week (3 points).   TOTAL SCORE 8/14    Objective:   Labs:   Physical Exam Neurological:     Mental Status: She is alert and oriented to person, place, and time.    Review of Systems  Respiratory:  Negative for shortness of breath.   Cardiovascular:  Positive for leg swelling. Negative for chest pain.        Patient has right leg swelling - previously noted on exam at past visits. No changes   Assessment/Plan:   Understanding of regimen: fair  Understanding of indications: fair  Potential of compliance: fair  Patient has known adherence challenges based on score of 8 for questionnaire. Barriers include: lack of knowledge and forgetfulness. Medication list reviewed and updated. Patient was provided with a printed medication list. Set up morning and evening alarms on patient's phone to help her remember to take her medications. Sent in new prescription for metformin as patient was unable to crush XR form. Patient will try crushing tablets to see if this improves ability to take medication.  Filled out application for patient assistance for Ozempic. Patient previously prescribed by prior PCP, Dr. Ouida Sills, but was never able to pick up due to cost.  Follow-up appointment pending approval of patient assistance. Written patient instructions provided.  This appointment required 60 minutes of patient care (this includes precharting, chart review, review of results, and face-to-face care).  Thank you for involving pharmacy to assist in providing this patient's care

## 2021-04-25 NOTE — Addendum Note (Signed)
Addended by: Hughes Better on: 04/25/2021 02:31 PM   Modules accepted: Orders

## 2021-04-26 ENCOUNTER — Other Ambulatory Visit (HOSPITAL_COMMUNITY): Payer: Self-pay

## 2021-04-27 ENCOUNTER — Other Ambulatory Visit: Payer: Self-pay

## 2021-04-27 ENCOUNTER — Encounter: Payer: Self-pay | Admitting: Physician Assistant

## 2021-04-27 ENCOUNTER — Ambulatory Visit (INDEPENDENT_AMBULATORY_CARE_PROVIDER_SITE_OTHER): Payer: Medicare Other | Admitting: Physician Assistant

## 2021-04-27 VITALS — BP 152/72 | HR 64 | Ht 68.0 in | Wt 243.2 lb

## 2021-04-27 DIAGNOSIS — Z950 Presence of cardiac pacemaker: Secondary | ICD-10-CM | POA: Diagnosis not present

## 2021-04-27 DIAGNOSIS — I10 Essential (primary) hypertension: Secondary | ICD-10-CM | POA: Diagnosis not present

## 2021-04-27 DIAGNOSIS — I480 Paroxysmal atrial fibrillation: Secondary | ICD-10-CM | POA: Diagnosis not present

## 2021-04-27 DIAGNOSIS — I4819 Other persistent atrial fibrillation: Secondary | ICD-10-CM | POA: Diagnosis not present

## 2021-04-27 DIAGNOSIS — E785 Hyperlipidemia, unspecified: Secondary | ICD-10-CM

## 2021-04-27 LAB — CUP PACEART INCLINIC DEVICE CHECK
Battery Remaining Longevity: 96 mo
Battery Voltage: 3.03 V
Brady Statistic RV Percent Paced: 13.73 %
Date Time Interrogation Session: 20221104182828
Implantable Pulse Generator Implant Date: 20210817
Lead Channel Impedance Value: 510 Ohm
Lead Channel Pacing Threshold Amplitude: 0.5 V
Lead Channel Pacing Threshold Pulse Width: 0.4 ms
Lead Channel Sensing Intrinsic Amplitude: 24.188 mV
Lead Channel Setting Pacing Amplitude: 1.125
Lead Channel Setting Pacing Pulse Width: 0.4 ms
Lead Channel Setting Sensing Sensitivity: 2 mV

## 2021-04-27 NOTE — Progress Notes (Signed)
Cardiology Office Note Date:  04/27/2021  Patient ID:  [name], [name] 1956/05/19, MRN [id number] PCP:  Donney Dice, DO  Electrophysiologist: Dr. Rayann Heman     Chief Complaint: over due visit  History of Present Illness: [name] is a 65 y.o. female with history of DM, diabetic neuropathy, HTN, HLD, GERD, OSA (w/CPAP), NASH, obesity (s/p gastric bypass), AFib, symptomatic bradycardia w/PPM  She comes in today to be seen for Dr. Rayann Heman, last see by him it appears at the time of her pacer implant.  Notes of late for musculoskeletal issues  TODAY She is doing well No recurrent syncope She ha had a 2-3 times in the year a slight feeling of being lightheaded (like being a  little drunk), last about 15 seconds and resolves This is less often, less severe then the episodes she was having prior to the pacer She has sone DOE< but this seems a little random, no orthopnea or PND symptoms, no rest  SOB, and often none at all Generally feels tired No bleeding or signs of bleeding  PMD does labs   Device information MDT Micra leadless PPM implanted 02/08/20   Past Medical History:  Diagnosis Date   Advanced directives, counseling/discussion 04/12/2020   Allergy    Anemia    ANEMIA, PERNICIOUS, HX OF 05/13/2007   Anxiety    Arthritis    ASYMPTOMATIC POSTMENOPAUSAL STATUS 02/18/2008   B12 deficiency 12/13/2016   BACK PAIN, LUMBAR 11/19/2007   Blood in urine 07/19/2019   Bowel incontinence    Cataract    Cellulitis of left lower leg 10/28/2013   Cellulitis of leg, right 10/11/2010   Chronic depression 10/06/2007   Chronic dermatitis of hands 05/02/2009   Coronary artery disease involving native coronary artery of native heart without angina pectoris 07/07/2018   Last Assessment & Plan:  Formatting of this note might be different from the original. The patient reports remote cardiac catheterization with up to 40% plaque.  Subsequent Cardiolite stress test in October 2019 did not show  any evidence of active ischemia or prior infarction. Formatting of this note might be different from the original. medical  Last Assessment & Plan:  Formatting of this note mi   DEPRESSION, CHRONIC 10/06/2007   Diabetes mellitus without complication ([place])    DIABETES MELLITUS, WITH NEUROLOGICAL COMPLICATIONS 1/94/1740   Diabetic neuropathy ([place])    DYSLIPIDEMIA 05/13/2007   Dyslipidemia 04/15/2017   Last Assessment & Plan:  The patient will continue atorvastatin for her dyslipidemia.   Dyslipidemia associated with type 2 diabetes mellitus ([place]) 07/11/2018   Essential hypertension 03/20/2010   Family history of colon cancer    Fecal incontinence 08/02/2015   Frozen shoulder    Lt   Full dentures    Gastroesophageal reflux disease without esophagitis 04/15/2017   Generalized abdominal pain 02/05/2018   Genetic testing 11/24/2017   Negative genetic testing on the common hereditary cancer panel.  The Hereditary Gene Panel offered by [facility] includes sequencing and/or deletion duplication testing of the following 47 genes: APC, ATM, AXIN2, BARD1, BMPR1A, BRCA1, BRCA2, BRIP1, CDH1, CDK4, CDKN2A (p14ARF), CDKN2A (p16INK4a), CHEK2, CTNNA1, DICER1, EPCAM (Deletion/duplication testing only), GREM1 (promoter region deletion/duplicat   GERD (gastroesophageal reflux disease)    GI bleed 03/15/2016   GOITER, MULTINODULAR 05/13/2007   GOITER, MULTINODULAR 05/13/2007    (last TSH 3.26; US soft tissue neck in 09/2004 showed multinodular goiter with specific small nodule for which was recommended to be followed by repeat US in 3-6  months 12/2016: Thyroid US: recommend repeat in 1 year (one nodule 1.7cm on left thyroid)    Headache    History of blood transfusion    Hx of colonic polyps 12/04/2010   HYPERCHOLESTEROLEMIA 03/20/2010   Hyperparathyroidism ([place]) 04/11/2020   HYPERTENSION 03/20/2010   Hypertension associated with diabetes ([place]) 07/11/2018   Hypothyroidism 07/19/2019   Iron deficiency anemia 06/19/2012    Low back pain 03/05/2012   Completed PT-notes say very motivated and had good progress.   CT (03/2020: Mild curvature convex to the right in the upper lumbar region into left in lower lumbar region. No antero or retrolisthesis in the supine position. retrolisthesis at L3-4 of 3 mm with flexion which increases to 6 mm with neutral and extension. Multifactorial spinal stenosis at L3-4 due to circumferential protrusion of the    Lower back pain    Major depression in remission ([place]) 07/07/2018   Last Assessment & Plan:  Mood has seemed good on sertraline.   Malabsorption 03/12/2016   NASH (nonalcoholic steatohepatitis)    Nausea 07/19/2019   Need for immunization against influenza 04/12/2020   No-show for appointment 03/08/2020   Nonobstructive CAD  08/26/2013   The patient has prior cardiac workup in 2006 when she underwent a stress test for abnormal EKG. The stress test was nonconclusive and she underwent cardiac catheterization that showed 30% stenosis in the LAD in 25% stenosis in RCA she was treated medically.     OA (osteoarthritis)    OBSTRUCTIVE SLEEP APNEA 06/12/2007   uses CPAP.   Pacemaker    Paroxysmal atrial fibrillation ([place]) 05/11/2018   Last Assessment & Plan:  Patient was noted to have episodes of paroxysmal atrial fibrillation that was predominantly rate controlled and at a relatively low burden.  She is chronically on anticoagulation now with Eliquis.  She does not take any negative chronotropic agents with her sinus bradycardia.  If she has any breakthrough more prolonged tachycardic episodes and requires negative chronotropi   Peripheral autonomic neuropathy due to diabetes mellitus ([place]) 07/11/2018   PERIPHERAL NEUROPATHY 01/12/2009   Peripheral neuropathy 01/12/2009   Peripheral vascular disease ([name])    Presbycusis of both ears 04/11/2020   Primary osteoarthritis involving multiple joints 07/07/2018   Last Assessment & Plan:  Post op left tka and here for rehab as her husband is  handicapped so she has limited help at home. Consitpation noted, some pain overnight, better with a pillow under her knee.   Primary osteoarthritis of left knee 04/23/2011   Previously seen by Dr. Alphonzo Severance. Will plan on repeat referral after trial injection.      PSORIASIS 05/28/2010   Rectal prolapse 05/24/2015   Rectovaginal fistula 05/25/2014   Renal insufficiency    stage 1 kd   S/P bariatric surgery-duodenal switch with sleeve gastrectomy 12/04/2013   S/P total knee arthroplasty, right 11/16/2019   Screening mammogram, encounter for 07/12/2019   Second degree AV block 02/08/2020   Serum calcium elevated 04/12/2020   Short bowel syndrome 07/11/2018   Spigelian hernia 02/05/2018   Status post left knee replacement 07/11/2018   Stress 05/24/2015   Surgical counseling visit 10/02/2019   Tubular adenoma of colon 03/09/2016   Colonoscopy in 10/2015: 3  tubular adenomas; GI recommends repeat colonoscopy in 3 years Colonoscopy August 2012: 3 tubular adenomas.    Type 2 diabetes, controlled, with neuropathy ([place]) 12/25/5007   Umbilical hernia without obstruction and without gangrene 04/15/2017   Unilateral primary osteoarthritis, left knee  UNSPECIFIED VENOUS INSUFFICIENCY 05/28/2010   Patient with frequent ulceration related to venous stasis-seen at wound care. Edema and Venous stasis changes due to this . Echo 04/16/11 with EF 60%. No signs diastolic dysfunction.        Vaccination against Streptococcus pneumoniae within past 5 years 04/12/2020   Vaginal irritation 07/19/2019   Venous stasis dermatitis of right lower extremity 01/02/2018   Vitamin D deficiency 03/09/2016   Wears glasses     Past Surgical History:  Procedure Laterality Date   CATARACT EXTRACTION W/ INTRAOCULAR LENS  IMPLANT, BILATERAL     COLONOSCOPY     ELECTROCARDIOGRAM  04/16/2006   EXAMINATION UNDER ANESTHESIA N/A 06/29/2014   Procedure: EXAM UNDER ANESTHESIA;  Surgeon: Janyth Contes, MD;  Location: [facility];  Service:  Gynecology;  Laterality: N/A;   Exercise myoview  01/24/2005   FLEXIBLE SIGMOIDOSCOPY     GASTRIC BYPASS  11/09/2013   GASTRIC BYPASS     JOINT REPLACEMENT     KNEE ARTHROSCOPY  2003   right   LAPAROSCOPY N/A 03/12/2016   Procedure: LAPAROSCOPIC ANASTOMOSIS OF INTESTINE (ENTEROENTEROSTOMY);  Surgeon: Ladora Daniel, MD;  Location: [facility];  Service: General;  Laterality: N/A;   LEG SURGERY Left    metal and pins in lower left leg   mrsa Right    arm   MULTIPLE TOOTH EXTRACTIONS     PACEMAKER LEADLESS INSERTION N/A 02/08/2020   Procedure: PACEMAKER LEADLESS INSERTION;  Surgeon: Thompson Grayer, MD;  Location: [facility];  Service: Cardiovascular;  Laterality: N/A;   PARATHYROIDECTOMY Left 02/19/2021   Procedure: LEFT PARATHYROIDECTOMY WITH FROZEN SECTION;  Surgeon: Izora Gala, MD;  Location: [facility];  Service: ENT;  Laterality: Left;   SHOULDER ARTHROSCOPY W/ ROTATOR CUFF REPAIR Right    stab phlebectomy  Right 02/10/2019   stab phlebectomy > 20 incisions right leg by [name] MD    TONSILLECTOMY     TOTAL KNEE ARTHROPLASTY Left 06/30/2018   Procedure: LEFT TOTAL KNEE ARTHROPLASTY;  Surgeon: Meredith Pel, MD;  Location: [facility];  Service: Orthopedics;  Laterality: Left;   TOTAL KNEE ARTHROPLASTY Right 11/16/2019   Procedure: RIGHT TOTAL KNEE ARTHROPLASTY-CEMENTED;  Surgeon: Meredith Pel, MD;  Location: [facility];  Service: Orthopedics;  Laterality: Right;   VARICOSE VEIN SURGERY     Remotef    Current Outpatient Medications  Medication Sig Dispense Refill   Accu-Chek Softclix Lancets lancets Use as instructed once daily 100 each 0   amLODipine (NORVASC) 10 MG tablet Take 1 tablet (10 mg total) by mouth daily. 90 tablet 3   apixaban (ELIQUIS) 5 MG TABS tablet Take 1 tablet (5 mg total) by mouth 2 (two) times daily. Resume with EVENING dose today. 60 tablet 0   atorvastatin (LIPITOR) 80 MG tablet Take 1 tablet (80 mg total) by mouth daily. 90 tablet 3   blood glucose  meter kit and supplies KIT Dispense based on patient and insurance preference. Use up to four times daily as directed. 1 each 0   Blood Glucose Monitoring Suppl (ACCU-CHEK GUIDE) w/Device KIT Use directed 1 kit 0   gabapentin (NEURONTIN) 100 MG capsule Take 1 capsule (100 mg total) by mouth in the morning. 60 capsule 3   gabapentin (NEURONTIN) 300 MG capsule TAKE 1 CAPSULE BY MOUTH THREE TIMES A DAY (Patient taking differently: Take 300 mg by mouth at bedtime.) 270 capsule 3   glucose blood (ACCU-CHEK AVIVA PLUS) test strip Use to check blood glucose daily 100  each 0   lisinopril-hydrochlorothiazide (ZESTORETIC) 20-25 MG tablet Take 1 tablet by mouth daily.     metFORMIN (GLUCOPHAGE-XR) 500 MG 24 hr tablet Take 1,000 mg by mouth every morning.     Semaglutide,0.25 or 0.5MG/DOS, (OZEMPIC, 0.25 OR 0.5 MG/DOSE,) 2 MG/1.5ML SOPN Inject 0.25 mg into the skin once a week. This is a patient assistance medication. Patient may not be approved and/or have medication. Please ask and verify when performing med review. 1.5 mL 0   sertraline (ZOLOFT) 100 MG tablet Take 1 tablet (100 mg total) by mouth daily. 90 tablet 1   spironolactone (ALDACTONE) 25 MG tablet Take 1 tablet (25 mg total) by mouth daily. NEED OV. 90 tablet 0   Teduglutide, rDNA, 5 MG KIT Inject 5 mg into the skin daily.      tiZANidine (ZANAFLEX) 4 MG capsule Take 1 capsule (4 mg total) by mouth 3 (three) times daily as needed for muscle spasms. 30 capsule 0   atorvastatin (LIPITOR) 40 MG tablet Take 40 mg by mouth daily. (Patient not taking: Reported on 04/27/2021)     diclofenac Sodium (VOLTAREN) 1 % GEL Apply 2 g topically 4 (four) times daily. (Patient not taking: Reported on 04/27/2021) 150 g 3   furosemide (LASIX) 40 MG tablet Take 1 tablet (40 mg total) by mouth daily. (Patient not taking: Reported on 04/27/2021) 90 tablet 3   gabapentin (NEURONTIN) 300 MG capsule Take 300 mg by mouth at bedtime. (Patient not taking: Reported on 04/27/2021)      HYDROcodone-acetaminophen (NORCO) 7.5-325 MG tablet Take 1 tablet by mouth every 6 (six) hours as needed for moderate pain. (Patient not taking: Reported on 04/27/2021) 20 tablet 0   metFORMIN (GLUCOPHAGE) 1000 MG tablet Take 1 tablet (1,000 mg total) by mouth daily with breakfast. (Patient not taking: Reported on 04/27/2021) 90 tablet 0   ondansetron (ZOFRAN ODT) 8 MG disintegrating tablet Take 1 tablet (8 mg total) by mouth every 8 (eight) hours as needed for nausea or vomiting. (Patient not taking: Reported on 04/27/2021) 20 tablet 0   No current facility-administered medications for this visit.    Allergies:   Cephalexin, Latex, Neomycin-bacitracin zn-polymyx, and Tape   Social History:  The patient  reports that she quit smoking about 26 years ago. Her smoking use included cigarettes. She started smoking about 50 years ago. She has a 48.00 pack-year smoking history. She has never used smokeless tobacco. She reports that she does not drink alcohol and does not use drugs.   Family History:  The patient's family history includes Aneurysm in her mother; Bipolar disorder in her sister and sister; Cancer in her paternal aunt; Cerebral aneurysm in her mother; Cirrhosis in her father; Colon cancer (age of onset: 15) in her sister; Colon cancer (age of onset: 77) in her father; Colon cancer (age of onset: 59) in her sister; Congestive Heart Failure in her maternal grandmother; Hyperlipidemia in her father; Hypertension in her father.  ROS:  Please see the history of present illness.    All other systems are reviewed and otherwise negative.   PHYSICAL EXAM:  VS:  BP (!) 152/72   Pulse 64   Ht 5' 8"  (1.727 m)   Wt 243 lb 3.2 oz (110.3 kg)   SpO2 98%   BMI 36.98 kg/m  BMI: Body mass index is 36.98 kg/m. Well nourished, well developed, in no acute distress HEENT: normocephalic, atraumatic Neck: no JVD, carotid bruits or masses Cardiac:  irreg-irreg; no significant murmurs, no rubs,  or  gallops Lungs:  CTA b/l, no wheezing, rhonchi or rales Abd: soft, nontender MS: no deformity or atrophy Ext: trace edema Skin: warm and dry, no rash Neuro:  No gross deficits appreciated Psych: euthymic mood, full affect    EKG:  not done today 02/15/21 EKG is personally reviewed AFib 60bpm, some V paced beats  Device interrogation done today and reviewed by myself:  Batter and electrode measurements are good VVI 50 13.7% VP   11/18/19: TTE IMPRESSIONS   1. Left ventricular ejection fraction, by estimation, is 55 to 60%. The  left ventricle has normal function. The left ventricle has no regional  wall motion abnormalities. Left ventricular diastolic function could not  be evaluated.   2. Right ventricular systolic function is normal. The right ventricular  size is normal. There is normal pulmonary artery systolic pressure.   3. The mitral valve is abnormal. Trivial mitral valve regurgitation.   4. The aortic valve is tricuspid. Aortic valve regurgitation is not  visualized.   Recent Labs: 05/12/2020: TSH 2.73 02/15/2021: ALT 9; BUN 13; Creatinine, Ser 0.92; Hemoglobin 13.3; Platelets 221; Potassium 4.6; Sodium 140  11/16/2020: Chol/HDL Ratio 3.2; Cholesterol, Total 185; HDL 58; LDL Chol Calc (NIH) 112; Triglycerides 84   CrCl cannot be calculated (Patient's most recent lab result is older than the maximum 21 days allowed.).   Wt Readings from Last 3 Encounters:  04/27/21 243 lb 3.2 oz (110.3 kg)  04/25/21 243 lb 3.2 oz (110.3 kg)  03/16/21 242 lb 3.2 oz (109.9 kg)     Other studies reviewed: Additional studies/records reviewed today include: summarized above  ASSESSMENT AND PLAN:  PPM Intact function, no programming changes made  Persistent Afib CHA2DS2Vasc is 4, on Eliquis, appropriately dosed   HTN High today, she was busy this AM with her husband's transportation and MD visits, left prior to take her meds She will keep an eye on it at home  HLD Followed by  her PMD Not to bad at her last check   Disposition: F/u with remotes as usual and in clinic in 1 year, sooner if needed  Current medicines are reviewed at length with the patient today.  The patient did not have any concerns regarding medicines.  Venetia Night, PA-C 04/27/2021 5:03 PM     [address] [place] [address] [phone] (office)  [phone] (fax)

## 2021-04-27 NOTE — Patient Instructions (Signed)
Medication Instructions:   Your physician recommends that you continue on your current medications as directed. Please refer to the Current Medication list given to you today.   *If you need a refill on your cardiac medications before your next appointment, please call your pharmacy*   Lab Work:  [place]   If you have labs (blood work) drawn today and your tests are completely normal, you will receive your results only by: [place] (if you have MyChart) OR A paper copy in the mail If you have any lab test that is abnormal or we need to change your treatment, we will call you to review the results.   Testing/Procedures: NONE ORDERED  TODAY     Follow-Up: At [facility], you and your health needs are our priority.  As part of our continuing mission to provide you with exceptional heart care, we have created designated Provider Care Teams.  These Care Teams include your primary Cardiologist (physician) and Advanced Practice Providers (APPs -  Physician Assistants and Nurse Practitioners) who all work together to provide you with the care you need, when you need it.  We recommend signing up for the patient portal called "MyChart".  Sign up information is provided on this After Visit Summary.  MyChart is used to connect with patients for Virtual Visits (Telemedicine).  Patients are able to view lab/test results, encounter notes, upcoming appointments, etc.  Non-urgent messages can be sent to your provider as well.   To learn more about what you can do with MyChart, go to [email or web address].    Your next appointment:   1 year(s)  The format for your next appointment:   In Person  Provider:   You will see one of the following Advanced Practice Providers on your designated Care Team:   [name], [place]       Other Instructions

## 2021-05-03 ENCOUNTER — Other Ambulatory Visit (HOSPITAL_COMMUNITY): Payer: Self-pay

## 2021-05-09 ENCOUNTER — Ambulatory Visit: Payer: Medicare Other | Admitting: Family Medicine

## 2021-05-10 ENCOUNTER — Ambulatory Visit (INDEPENDENT_AMBULATORY_CARE_PROVIDER_SITE_OTHER): Payer: Medicare Other

## 2021-05-10 DIAGNOSIS — I441 Atrioventricular block, second degree: Secondary | ICD-10-CM

## 2021-05-11 LAB — CUP PACEART REMOTE DEVICE CHECK
Battery Remaining Longevity: 96 mo
Battery Voltage: 3.03 V
Brady Statistic RV Percent Paced: 11.51 %
Date Time Interrogation Session: 20221118084700
Implantable Pulse Generator Implant Date: 20210817
Lead Channel Impedance Value: 480 Ohm
Lead Channel Pacing Threshold Amplitude: 0.5 V
Lead Channel Pacing Threshold Pulse Width: 0.4 ms
Lead Channel Sensing Intrinsic Amplitude: 25.2 mV
Lead Channel Setting Pacing Amplitude: 1.125
Lead Channel Setting Pacing Pulse Width: 0.4 ms
Lead Channel Setting Sensing Sensitivity: 2 mV

## 2021-05-15 NOTE — Progress Notes (Addendum)
NEUROLOGY CONSULTATION NOTE  [name] MRN: [id number] DOB: 17-Feb-1956  Referring provider: Meredith Pel, MD Primary care provider: Donney Dice, DO  Reason for consult:  balance disorder  Assessment/Plan:   Balance disorder - multifactorial likely related to lumbar spinal stenosis, diabetic polyneuropathy.and body habitus.  However, will check for any secondary intracranial etiology  CT head.  Further recommendations pending results PCP may consider physical therapy  06/21/2021 ADDENDUM:  CT HEAD on 06/20/2021 unremarkable.  No further recommendations. Metta Clines, DO   Subjective:  [name] is a 65 year old female with HTN, DM II, HDL, CAD, s/p PPM, hyperparathyroidism status post parathyroidectomy, NASH, OA and chronic back pain who presents for balance disorder.  History supplemented by referring provider's note.  She reports balance problems for the past year.  It is intermittent.  Sometimes she will walk and start veering to either side.  Not associated with dizziness.  A couple of months ago, she had 3 falls.  One time, she tripped.  Another time her right leg just gave out.  She has history of bilateral knee replacement but assessment by ortho showed good stability.  She has chronic low back pain with lumbar spinal stenosis.  CT myelogram of the lumbar spine from 02/15/2021 was personally reviewed and revealed moderate to severe multifactorial spinal stenosis at L3-4 as well as multilevel neural foraminal stenosis.  She has type 2 diabetes mellitus.  Hgb A1c from 02/19/2021 was 7.7.  She also has cervical with radicular pain.  X-ray of cervical spine from 03/13/2021 showed advanced degenerative cervical spondylosis with significant multilevel disc and facet disease.  She has history of gastric bypass surgery but reports 30 lb weight gain over the past couple of years.     PAST MEDICAL HISTORY: Past Medical History:  Diagnosis Date   Advanced directives,  counseling/discussion 04/12/2020   Allergy    Anemia    ANEMIA, PERNICIOUS, HX OF 05/13/2007   Anxiety    Arthritis    ASYMPTOMATIC POSTMENOPAUSAL STATUS 02/18/2008   B12 deficiency 12/13/2016   BACK PAIN, LUMBAR 11/19/2007   Blood in urine 07/19/2019   Bowel incontinence    Cataract    Cellulitis of left lower leg 10/28/2013   Cellulitis of leg, right 10/11/2010   Chronic depression 10/06/2007   Chronic dermatitis of hands 05/02/2009   Coronary artery disease involving native coronary artery of native heart without angina pectoris 07/07/2018   Last Assessment & Plan:  Formatting of this note might be different from the original. The patient reports remote cardiac catheterization with up to 40% plaque.  Subsequent Cardiolite stress test in October 2019 did not show any evidence of active ischemia or prior infarction. Formatting of this note might be different from the original. medical  Last Assessment & Plan:  Formatting of this note mi   DEPRESSION, CHRONIC 10/06/2007   Diabetes mellitus without complication ([place])    DIABETES MELLITUS, WITH NEUROLOGICAL COMPLICATIONS 02/01/5725   Diabetic neuropathy ([place])    DYSLIPIDEMIA 05/13/2007   Dyslipidemia 04/15/2017   Last Assessment & Plan:  The patient will continue atorvastatin for her dyslipidemia.   Dyslipidemia associated with type 2 diabetes mellitus ([place]) 07/11/2018   Essential hypertension 03/20/2010   Family history of colon cancer    Fecal incontinence 08/02/2015   Frozen shoulder    Lt   Full dentures    Gastroesophageal reflux disease without esophagitis 04/15/2017   Generalized abdominal pain 02/05/2018   Genetic testing 11/24/2017  Negative genetic testing on the common hereditary cancer panel.  The Hereditary Gene Panel offered by [facility] includes sequencing and/or deletion duplication testing of the following 47 genes: APC, ATM, AXIN2, BARD1, BMPR1A, BRCA1, BRCA2, BRIP1, CDH1, CDK4, CDKN2A (p14ARF), CDKN2A (p16INK4a), CHEK2, CTNNA1,  DICER1, EPCAM (Deletion/duplication testing only), GREM1 (promoter region deletion/duplicat   GERD (gastroesophageal reflux disease)    GI bleed 03/15/2016   GOITER, MULTINODULAR 05/13/2007   GOITER, MULTINODULAR 05/13/2007    (last TSH 3.26; US soft tissue neck in 09/2004 showed multinodular goiter with specific small nodule for which was recommended to be followed by repeat US in 3-6 months 12/2016: Thyroid US: recommend repeat in 1 year (one nodule 1.7cm on left thyroid)    Headache    History of blood transfusion    Hx of colonic polyps 12/04/2010   HYPERCHOLESTEROLEMIA 03/20/2010   Hyperparathyroidism ([place]) 04/11/2020   HYPERTENSION 03/20/2010   Hypertension associated with diabetes ([place]) 07/11/2018   Hypothyroidism 07/19/2019   Iron deficiency anemia 06/19/2012   Low back pain 03/05/2012   Completed PT-notes say very motivated and had good progress.   CT (03/2020: Mild curvature convex to the right in the upper lumbar region into left in lower lumbar region. No antero or retrolisthesis in the supine position. retrolisthesis at L3-4 of 3 mm with flexion which increases to 6 mm with neutral and extension. Multifactorial spinal stenosis at L3-4 due to circumferential protrusion of the    Lower back pain    Major depression in remission ([place]) 07/07/2018   Last Assessment & Plan:  Mood has seemed good on sertraline.   Malabsorption 03/12/2016   NASH (nonalcoholic steatohepatitis)    Nausea 07/19/2019   Need for immunization against influenza 04/12/2020   No-show for appointment 03/08/2020   Nonobstructive CAD  08/26/2013   The patient has prior cardiac workup in 2006 when she underwent a stress test for abnormal EKG. The stress test was nonconclusive and she underwent cardiac catheterization that showed 30% stenosis in the LAD in 25% stenosis in RCA she was treated medically.     OA (osteoarthritis)    OBSTRUCTIVE SLEEP APNEA 06/12/2007   uses CPAP.   Pacemaker    Paroxysmal atrial fibrillation  ([place]) 05/11/2018   Last Assessment & Plan:  Patient was noted to have episodes of paroxysmal atrial fibrillation that was predominantly rate controlled and at a relatively low burden.  She is chronically on anticoagulation now with Eliquis.  She does not take any negative chronotropic agents with her sinus bradycardia.  If she has any breakthrough more prolonged tachycardic episodes and requires negative chronotropi   Peripheral autonomic neuropathy due to diabetes mellitus ([place]) 07/11/2018   PERIPHERAL NEUROPATHY 01/12/2009   Peripheral neuropathy 01/12/2009   Peripheral vascular disease ([place])    Presbycusis of both ears 04/11/2020   Primary osteoarthritis involving multiple joints 07/07/2018   Last Assessment & Plan:  Post op left tka and here for rehab as her husband is handicapped so she has limited help at home. Consitpation noted, some pain overnight, better with a pillow under her knee.   Primary osteoarthritis of left knee 04/23/2011   Previously seen by Dr. Alphonzo Severance. Will plan on repeat referral after trial injection.      PSORIASIS 05/28/2010   Rectal prolapse 05/24/2015   Rectovaginal fistula 05/25/2014   Renal insufficiency    stage 1 kd   S/P bariatric surgery-duodenal switch with sleeve gastrectomy 12/04/2013   S/P total knee arthroplasty, right 11/16/2019   Screening  mammogram, encounter for 07/12/2019   Second degree AV block 02/08/2020   Serum calcium elevated 04/12/2020   Short bowel syndrome 07/11/2018   Spigelian hernia 02/05/2018   Status post left knee replacement 07/11/2018   Stress 05/24/2015   Surgical counseling visit 10/02/2019   Tubular adenoma of colon 03/09/2016   Colonoscopy in 10/2015: 3  tubular adenomas; GI recommends repeat colonoscopy in 3 years Colonoscopy August 2012: 3 tubular adenomas.    Type 2 diabetes, controlled, with neuropathy ([place]) 2/94/7654   Umbilical hernia without obstruction and without gangrene 04/15/2017   Unilateral primary osteoarthritis, left  knee    UNSPECIFIED VENOUS INSUFFICIENCY 05/28/2010   Patient with frequent ulceration related to venous stasis-seen at wound care. Edema and Venous stasis changes due to this . Echo 04/16/11 with EF 60%. No signs diastolic dysfunction.        Vaccination against Streptococcus pneumoniae within past 5 years 04/12/2020   Vaginal irritation 07/19/2019   Venous stasis dermatitis of right lower extremity 01/02/2018   Vitamin D deficiency 03/09/2016   Wears glasses     PAST SURGICAL HISTORY: Past Surgical History:  Procedure Laterality Date   CATARACT EXTRACTION W/ INTRAOCULAR LENS  IMPLANT, BILATERAL     COLONOSCOPY     ELECTROCARDIOGRAM  04/16/2006   EXAMINATION UNDER ANESTHESIA N/A 06/29/2014   Procedure: EXAM UNDER ANESTHESIA;  Surgeon: Janyth Contes, MD;  Location: [facility];  Service: Gynecology;  Laterality: N/A;   Exercise myoview  01/24/2005   FLEXIBLE SIGMOIDOSCOPY     GASTRIC BYPASS  11/09/2013   GASTRIC BYPASS     JOINT REPLACEMENT     KNEE ARTHROSCOPY  2003   right   LAPAROSCOPY N/A 03/12/2016   Procedure: LAPAROSCOPIC ANASTOMOSIS OF INTESTINE (ENTEROENTEROSTOMY);  Surgeon: Ladora Daniel, MD;  Location: [facility];  Service: General;  Laterality: N/A;   LEG SURGERY Left    metal and pins in lower left leg   mrsa Right    arm   MULTIPLE TOOTH EXTRACTIONS     PACEMAKER LEADLESS INSERTION N/A 02/08/2020   Procedure: PACEMAKER LEADLESS INSERTION;  Surgeon: Thompson Grayer, MD;  Location: [facility];  Service: Cardiovascular;  Laterality: N/A;   PARATHYROIDECTOMY Left 02/19/2021   Procedure: LEFT PARATHYROIDECTOMY WITH FROZEN SECTION;  Surgeon: Izora Gala, MD;  Location: [facility];  Service: ENT;  Laterality: Left;   SHOULDER ARTHROSCOPY W/ ROTATOR CUFF REPAIR Right    stab phlebectomy  Right 02/10/2019   stab phlebectomy > 20 incisions right leg by [name] MD    TONSILLECTOMY     TOTAL KNEE ARTHROPLASTY Left 06/30/2018   Procedure: LEFT TOTAL KNEE ARTHROPLASTY;   Surgeon: Meredith Pel, MD;  Location: [facility];  Service: Orthopedics;  Laterality: Left;   TOTAL KNEE ARTHROPLASTY Right 11/16/2019   Procedure: RIGHT TOTAL KNEE ARTHROPLASTY-CEMENTED;  Surgeon: Meredith Pel, MD;  Location: [facility];  Service: Orthopedics;  Laterality: Right;   VARICOSE VEIN SURGERY     Remotef    MEDICATIONS: Current Outpatient Medications on File Prior to Visit  Medication Sig Dispense Refill   Accu-Chek Softclix Lancets lancets Use as instructed once daily 100 each 0   amLODipine (NORVASC) 10 MG tablet Take 1 tablet (10 mg total) by mouth daily. 90 tablet 3   apixaban (ELIQUIS) 5 MG TABS tablet Take 1 tablet (5 mg total) by mouth 2 (two) times daily. Resume with EVENING dose today. 60 tablet 0   atorvastatin (LIPITOR) 40 MG tablet Take 40 mg by mouth  daily. (Patient not taking: Reported on 04/27/2021)     atorvastatin (LIPITOR) 80 MG tablet Take 1 tablet (80 mg total) by mouth daily. 90 tablet 3   blood glucose meter kit and supplies KIT Dispense based on patient and insurance preference. Use up to four times daily as directed. 1 each 0   Blood Glucose Monitoring Suppl (ACCU-CHEK GUIDE) w/Device KIT Use directed 1 kit 0   diclofenac Sodium (VOLTAREN) 1 % GEL Apply 2 g topically 4 (four) times daily. (Patient not taking: Reported on 04/27/2021) 150 g 3   furosemide (LASIX) 40 MG tablet Take 1 tablet (40 mg total) by mouth daily. (Patient not taking: Reported on 04/27/2021) 90 tablet 3   gabapentin (NEURONTIN) 100 MG capsule Take 1 capsule (100 mg total) by mouth in the morning. 60 capsule 3   gabapentin (NEURONTIN) 300 MG capsule TAKE 1 CAPSULE BY MOUTH THREE TIMES A DAY (Patient taking differently: Take 300 mg by mouth at bedtime.) 270 capsule 3   gabapentin (NEURONTIN) 300 MG capsule Take 300 mg by mouth at bedtime. (Patient not taking: Reported on 04/27/2021)     glucose blood (ACCU-CHEK AVIVA PLUS) test strip Use to check blood glucose daily 100 each 0    HYDROcodone-acetaminophen (NORCO) 7.5-325 MG tablet Take 1 tablet by mouth every 6 (six) hours as needed for moderate pain. (Patient not taking: Reported on 04/27/2021) 20 tablet 0   lisinopril-hydrochlorothiazide (ZESTORETIC) 20-25 MG tablet Take 1 tablet by mouth daily.     metFORMIN (GLUCOPHAGE) 1000 MG tablet Take 1 tablet (1,000 mg total) by mouth daily with breakfast. (Patient not taking: Reported on 04/27/2021) 90 tablet 0   metFORMIN (GLUCOPHAGE-XR) 500 MG 24 hr tablet Take 1,000 mg by mouth every morning.     ondansetron (ZOFRAN ODT) 8 MG disintegrating tablet Take 1 tablet (8 mg total) by mouth every 8 (eight) hours as needed for nausea or vomiting. (Patient not taking: Reported on 04/27/2021) 20 tablet 0   Semaglutide,0.25 or 0.5MG/DOS, (OZEMPIC, 0.25 OR 0.5 MG/DOSE,) 2 MG/1.5ML SOPN Inject 0.25 mg into the skin once a week. This is a patient assistance medication. Patient may not be approved and/or have medication. Please ask and verify when performing med review. 1.5 mL 0   sertraline (ZOLOFT) 100 MG tablet Take 1 tablet (100 mg total) by mouth daily. 90 tablet 1   spironolactone (ALDACTONE) 25 MG tablet Take 1 tablet (25 mg total) by mouth daily. NEED OV. 90 tablet 0   Teduglutide, rDNA, 5 MG KIT Inject 5 mg into the skin daily.      tiZANidine (ZANAFLEX) 4 MG capsule Take 1 capsule (4 mg total) by mouth 3 (three) times daily as needed for muscle spasms. 30 capsule 0   No current facility-administered medications on file prior to visit.    ALLERGIES: Allergies  Allergen Reactions   Cephalexin Hives and Rash    Denies Airway involvement   Latex Rash   Neomycin-Bacitracin Zn-Polymyx Rash   Tape Rash    Latex Prefers paper tape    FAMILY HISTORY: Family History  Problem Relation Age of Onset   Hyperlipidemia Father    Hypertension Father    Cirrhosis Father    Colon cancer Father 28       d. 69   Colon cancer Sister 61       d. 59   Bipolar disorder Sister    Aneurysm  Mother        brain   Cerebral aneurysm Mother  Colon cancer Sister 13       d. 24   Bipolar disorder Sister    Cancer Paternal Aunt        NOS, ? colon   Congestive Heart Failure Maternal Grandmother    Drug abuse Neg Hx    CAD Neg Hx    Stomach cancer Neg Hx    Hyperparathyroidism Neg Hx    Hypercalcemia Neg Hx     Objective:  Blood pressure (!) 158/76, pulse 90, height 5' 8"  (1.727 m), weight 236 lb 9.6 oz (107.3 kg), SpO2 96 %. General: No acute distress.  Patient appears well-groomed.   Head:  Normocephalic/atraumatic Eyes:  fundi examined but not visualized Neck: supple, no paraspinal tenderness, full range of motion Back: No paraspinal tenderness Heart: regular rate and rhythm Lungs: Clear to auscultation bilaterally. Vascular: No carotid bruits. Neurological Exam: Mental status: alert and oriented to person, place, and time, recent and remote memory intact, fund of knowledge intact, attention and concentration intact, speech fluent and not dysarthric, language intact. Cranial nerves: CN I: not tested CN II: pupils equal, round and reactive to light, visual fields intact CN III, IV, VI:  full range of motion, no nystagmus, no ptosis CN V: facial sensation intact. CN VII: upper and lower face symmetric CN VIII: hearing intact CN IX, X: gag intact, uvula midline CN XI: sternocleidomastoid and trapezius muscles intact CN XII: tongue midline Bulk & Tone: normal, no fasciculations. Motor:  muscle strength 5-/5 right EHL, otherwise 5/5 throughout Sensation:  Pinprick sensation reduced in right foot, reduced vibratory sensation in bilateral feet. Deep Tendon Reflexes:  2+ throughout,  toes downgoing.   Finger to nose testing:  Without dysmetria.   Heel to shin:  Without dysmetria.   Gait:  Mildly broad-based gait.  Able to turn.  Unable to tandem walk.  Romberg with sway.    Thank you for allowing me to take part in the care of this patient.  Metta Clines, DO  CC:   Donney Dice, DO  Meredith Pel, MD

## 2021-05-16 ENCOUNTER — Other Ambulatory Visit: Payer: Self-pay

## 2021-05-16 ENCOUNTER — Ambulatory Visit (INDEPENDENT_AMBULATORY_CARE_PROVIDER_SITE_OTHER): Payer: Medicare Other | Admitting: Neurology

## 2021-05-16 ENCOUNTER — Encounter: Payer: Self-pay | Admitting: Neurology

## 2021-05-16 VITALS — BP 158/76 | HR 90 | Ht 68.0 in | Wt 236.6 lb

## 2021-05-16 DIAGNOSIS — M48061 Spinal stenosis, lumbar region without neurogenic claudication: Secondary | ICD-10-CM

## 2021-05-16 DIAGNOSIS — R2681 Unsteadiness on feet: Secondary | ICD-10-CM

## 2021-05-16 DIAGNOSIS — E1142 Type 2 diabetes mellitus with diabetic polyneuropathy: Secondary | ICD-10-CM | POA: Diagnosis not present

## 2021-05-16 NOTE — Patient Instructions (Addendum)
Will check CT head.  Will let you know results.   We have sent a referral to [facility] for your CT Head  and they will call you directly to schedule your appointment. They are located at [place]. If you need to contact them directly please call [phone].

## 2021-05-21 NOTE — Progress Notes (Signed)
Remote pacemaker transmission.   

## 2021-05-23 ENCOUNTER — Ambulatory Visit (INDEPENDENT_AMBULATORY_CARE_PROVIDER_SITE_OTHER): Payer: Medicare Other | Admitting: Pharmacist

## 2021-05-23 ENCOUNTER — Other Ambulatory Visit: Payer: Self-pay

## 2021-05-23 DIAGNOSIS — E1142 Type 2 diabetes mellitus with diabetic polyneuropathy: Secondary | ICD-10-CM | POA: Diagnosis not present

## 2021-05-23 NOTE — Assessment & Plan Note (Signed)
T2DM is mostly controlled based on recent A1C but patient has yet to pick up glucometer to check readings. Medication adherence improved. Will increase Ozempic to 0.77m once weekly due to patient tolerating 0.231mdose well. Following instruction patient verbalized understanding of treatment plan.    1. Increased dose of GLP-1 Ozempic to 0.63m33mnce weekly on Wednesdays 2. Continued metformin 1000m80mily 3. Extensively discussed pathophysiology of diabetes, dietary effects on blood sugar control, and recommended lifestyle interventions.  4. Counseled on s/sx of and management of hypoglycemia 5. Next A1C anticipated at next office visit

## 2021-05-23 NOTE — Progress Notes (Signed)
Subjective:    Patient ID: [name], female    DOB: [birth year], 65 y.o.   MRN: [id number]  HPI Patient is a 65 y.o. female who presents for diabetes management and Ozempic teaching. She is in good spirits and presents without assistance. Patient was referred on 01/09/21 and last seen by provider, Dr. Caron Presume, on 03/16/21. Last seen in pharmacy clinic on 04/25/21.  Patient reports tolerating Ozempic well and has not had any issues.  Patient reports diabetes was diagnosed around 26 years ago.  Insurance coverage/medication affordability: Medicare A/B  Family/Social history: patient's husband is wheelchair bound due to MS  Current diabetes medications include: metformin 1077m, ozempic 0.282m(completed 4 doses) once weekly on Wednesdays Current hypertension medications include: amlodipine 1087mlisinopril-HCTZ 20-7m63mpironolactone 7mg59mrent hyperlipidemia medications include: atorvastatin (unclear of strength) Patient states that She is taking her medications as prescribed. Patient denies adherence with medications. Patient states that She misses her medications 1-2 times per week, on average. Patient reports improvement in missed doses since our visit in July in which we set alarms to help the patient remember to take her medications  Do you feel that your medications are working for you?  "I don't feel like the sertraline is doing anything"  Have you been experiencing any side effects to the medications prescribed? no  Do you have any problems obtaining medications due to transportation or finances?  no   Patient reported dietary habits:  Patient reports that she likes vegetables and tries to eat healthy overall but struggles with late night snacking of pork rinds. She requests information regarding snacking.  Patient-reported exercise habits: patient recently bought an exercise bike and has been using that around 30 minutes a few times a day.   Patient denies hypoglycemic  events. Patient denies polyuria (increased urination).  Patient reports polyphagia (increased appetite).  Patient reports polydipsia (increased thirst).  Patient reports neuropathy (nerve pain) in feet and sometimes hands. Patient denies visual changes. Patient reports self foot exams.    Objective:   Labs:   Physical Exam Neurological:     Mental Status: She is alert and oriented to person, place, and time.    Review of Systems  Gastrointestinal:  Negative for abdominal pain, diarrhea, nausea and vomiting.   Vitals:   05/23/21 1113  Weight: 234 lb (106.1 kg)  BMI (Calculated): 35.59     Lab Results  Component Value Date   HGBA1C 7.7 (H) 02/19/2021   HGBA1C 7.5 (A) 01/09/2021   HGBA1C 7.7 (A) 11/16/2020    Lab Results  Component Value Date   MICRALBCREAT 55.0 (H) 10/30/2017    Lipid Panel     Component Value Date/Time   CHOL 185 11/16/2020 1216   CHOL 189 08/28/2016 0000   TRIG 84 11/16/2020 1216   TRIG 63 08/28/2016 0000   TRIG 184 (H) 04/09/2006 0904   HDL 58 11/16/2020 1216   HDL 75 08/28/2016 0000   CHOLHDL 3.2 11/16/2020 1216   CHOLHDL 2.52 08/28/2016 0000   CHOLHDL 4.5 06/19/2012 1125   VLDL 61 (H) 06/19/2012 1125   LDLCALC 112 (H) 11/16/2020 1216   LDLDIRECT 113 (H) 07/05/2013 1512   The 10-year ASCVD risk score (Arnett DK, et al., 2019) is: 21.5%   Values used to calculate the score:     Age: 8 ye45s     Sex: Female     Is Non-Hispanic African American: No     Diabetic: Yes     Tobacco smoker: No  Systolic Blood Pressure: 072 mmHg     Is BP treated: Yes     HDL Cholesterol: 58 mg/dL     Total Cholesterol: 185 mg/dL    Assessment/Plan:   T2DM is mostly controlled based on recent A1C but patient has yet to pick up glucometer to check readings. Medication adherence improved. Will increase Ozempic to 0.53m once weekly due to patient tolerating 0.223mdose well. Following instruction patient verbalized understanding of treatment plan.     Increased dose of GLP-1 Ozempic to 0.56m52mnce weekly on Wednesdays Continued metformin 1000m78mily Extensively discussed pathophysiology of diabetes, dietary effects on blood sugar control, and recommended lifestyle interventions.  Counseled on s/sx of and management of hypoglycemia Next A1C anticipated at next office visit  ASCVD risk - secondary prevention in patient with diabetes. Last LDL is not controlled. Patient will bring in prescriptions to office visit on 12/6 with PCP to verify correct dose of statin. Statin may be causing myalgias patient previously reported. Will defer to PCP to discuss at office visit and can consider changing to rosuvastatin or decreasing dose if workup points towards statin.  Continued atorvastatin (dose unknown)  Hypertension longstanding and did not check blood pressure today as patient had yet to take her morning blood pressure medications which typically results in elevated readings in office. Patient instructed to take blood pressure medications before appointment with PCP on 12/6.  Follow-up appointment 4 weeks to titrate Ozempic. Written patient instructions provided.  This appointment required 30 minutes of direct patient care.  Thank you for involving pharmacy to assist in providing this patient's care.

## 2021-05-23 NOTE — Assessment & Plan Note (Signed)
>>  ASSESSMENT AND PLAN FOR TYPE 2 DM WITH DIABETIC NEUROPATHY AFFECTING BOTH SIDES OF BODY ([facility]) WRITTEN ON 05/23/2021 11:20 AM BY KELLEY, RACHELLE, RPH-CPP  T2DM is mostly controlled based on recent A1C but patient has yet to pick up glucometer to check readings. Medication adherence improved. Will increase Ozempic  to 0.5mg  once weekly due to patient tolerating 0.25mg  dose well. Following instruction patient verbalized understanding of treatment plan.    Increased dose of GLP-1 Ozempic  to 0.5mg  once weekly on Wednesdays Continued metformin  1000mg  daily Extensively discussed pathophysiology of diabetes, dietary effects on blood sugar control, and recommended lifestyle interventions.  Counseled on s/sx of and management of hypoglycemia Next A1C anticipated at next office visit

## 2021-05-23 NOTE — Patient Instructions (Addendum)
[name] it was a pleasure seeing you today.   Please do the following:  Increase your Ozempic to 0.62m once weekly as directed today during your appointment. If you have any questions or if you believe something has occurred because of this change, call me or your doctor to let one of [employer].  Continue checking blood sugars at home. It's really important that you record these and bring these in to your next doctor's appointment.  Continue making the lifestyle changes we've discussed together during our visit. Diet and exercise play a significant role in improving your blood sugars.  Follow-up with me in six weeks. Please bring all of your medications to your visit with Dr. GLarae Groomson Tuesday. Please also let [employer] if you are unable to get your glucose meter to check your blood sugars   Hypoglycemia or low blood sugar:   Low blood sugar can happen quickly and may become an emergency if not treated right away.   While this shouldn't happen often, it can be brought upon if you skip a meal or do not eat enough. Also, if your insulin or other diabetes medications are dosed too high, this can cause your blood sugar to go to low.   Warning signs of low blood sugar include: Feeling shaky or dizzy Feeling weak or tired  Excessive hunger Feeling anxious or upset  Sweating even when you aren't exercising  What to do if I experience low blood sugar? Follow the Rule of 15 Check your blood sugar with your meter. If lower than 70, proceed to step 2.  Treat with 15 grams of fast acting carbs which is found in 3-4 glucose tablets. If none are available you can try hard candy, 1 tablespoon of sugar or honey,4 ounces of fruit juice, or 6 ounces of REGULAR soda.  Re-check your sugar in 15 minutes. If it is still below 70, do what you did in step 2 again. If your blood sugar has come back up, go ahead and eat a snack or small meal made up of complex carbs (ex. Whole grains) and protein at this time to  avoid recurrence of low blood sugar.

## 2021-05-24 ENCOUNTER — Other Ambulatory Visit: Payer: Self-pay

## 2021-05-24 ENCOUNTER — Telehealth: Payer: Self-pay | Admitting: Internal Medicine

## 2021-05-24 DIAGNOSIS — G6289 Other specified polyneuropathies: Secondary | ICD-10-CM

## 2021-05-24 DIAGNOSIS — I480 Paroxysmal atrial fibrillation: Secondary | ICD-10-CM

## 2021-05-24 MED ORDER — APIXABAN 5 MG PO TABS: 5.0000 mg | ORAL_TABLET | Freq: Two times a day (BID) | ORAL | 5 refills | Status: DC

## 2021-05-24 NOTE — Telephone Encounter (Signed)
Eliquis 52m refill request received. Patient is 65years old, weight-106.1kg, Crea-0.92 on 02/15/2021, Diagnosis-Afib, and last seen by RTommye Standardon 04/27/2021. Dose is appropriate based on dosing criteria. Will send in refill to requested pharmacy.

## 2021-05-24 NOTE — Telephone Encounter (Signed)
  *  STAT* If patient is at the pharmacy, call can be transferred to refill team.   1. Which medications need to be refilled? (please list name of each medication and dose if known) apixaban (ELIQUIS) 5 MG TABS tablet  2. Which pharmacy/location (including street and city if local pharmacy) is medication to be sent to? [id number] [place] - [address]. Do they need a 30 day or 90 day supply? 30 days  Pt needs refill today, she is out of meds

## 2021-05-25 MED ORDER — GABAPENTIN 100 MG PO CAPS: 100.0000 mg | ORAL_CAPSULE | Freq: Every morning | ORAL | 0 refills | Status: DC

## 2021-05-28 ENCOUNTER — Ambulatory Visit: Payer: Medicare Other | Admitting: Neurology

## 2021-05-29 ENCOUNTER — Encounter: Payer: Self-pay | Admitting: Family Medicine

## 2021-05-29 ENCOUNTER — Other Ambulatory Visit: Payer: Self-pay

## 2021-05-29 ENCOUNTER — Ambulatory Visit (INDEPENDENT_AMBULATORY_CARE_PROVIDER_SITE_OTHER): Payer: Medicare Other | Admitting: Family Medicine

## 2021-05-29 VITALS — BP 126/55 | HR 78 | Ht 68.0 in | Wt 232.2 lb

## 2021-05-29 DIAGNOSIS — E785 Hyperlipidemia, unspecified: Secondary | ICD-10-CM | POA: Diagnosis not present

## 2021-05-29 DIAGNOSIS — E1140 Type 2 diabetes mellitus with diabetic neuropathy, unspecified: Secondary | ICD-10-CM | POA: Diagnosis present

## 2021-05-29 DIAGNOSIS — E1169 Type 2 diabetes mellitus with other specified complication: Secondary | ICD-10-CM

## 2021-05-29 DIAGNOSIS — I152 Hypertension secondary to endocrine disorders: Secondary | ICD-10-CM

## 2021-05-29 DIAGNOSIS — E1159 Type 2 diabetes mellitus with other circulatory complications: Secondary | ICD-10-CM | POA: Diagnosis not present

## 2021-05-29 LAB — POCT GLYCOSYLATED HEMOGLOBIN (HGB A1C): HbA1c, POC (controlled diabetic range): 8.4 % — AB (ref 0.0–7.0)

## 2021-05-29 NOTE — Assessment & Plan Note (Addendum)
-  A1c 8.4, poorly controlled -increased metformin to 1000 mg bid  -up to date on foot and ophthalmology exams -diet and exercise counseling provided  -leg cramps likely secondary to neuropathic changes from uncontrolled diabetes, continue gabapentin 100 mg, reassuringly this has resolved, not concerning for DVT -follow up in 3 months for repeat A1c, consider starting insulin or farxiga if poorly controlled at follow up visit

## 2021-05-29 NOTE — Assessment & Plan Note (Signed)
-  med rec updated thoroughly as patient brought in medications -continue atorvastatin 40 mg daily  -diet and exercise counseling provided

## 2021-05-29 NOTE — Patient Instructions (Signed)
It was great seeing you today!  Today we discussed mayn things. Your blood pressure is at goal, please continue to take amlodipine and spironolactone daily. Make sure to check your blood pressure with an arm cuff and not the wrist cuff for a more accurate reading.  Your A1c is 8.4, which is up from last time so we will increase your metformin 1000 mg to twice daily instead of once daily. Make sure to eat a balanced diet as we discussed.   Regarding your leg cramps, I am glad that they are better.   Please follow up at your next scheduled appointment in 3 months, if anything arises between now and then, please don't hesitate to contact our office.   Thank you for allowing [place] to be a part of your medical care!  Thank you, Dr. Larae Grooms

## 2021-05-29 NOTE — Progress Notes (Signed)
    SUBJECTIVE:   Hypertension  Denies chest pain or dyspnea. Endorses compliance on amlodipine daily. Has been really trying to eat healthy and exercise, does a good job of staying hydrated throughout the day. Does not check her BP regualrly.   Type 2 DM  Compliant metformin 1000 mg daily and ozempic injections weekly. Most recent A1c 7.7 which was 3 months ago. Does not check glucose levels at home. Denies dizziness, headaches and vision changes that may indicate hypoglycemia. Last saw ophthalmologist earlier this year.   Leg cramps Endorsing intermittent leg cramps over the past few months. Located on legs bilaterally, worse on the right and radiates down to her feet. She has not had them for a few days and they are feeling significantly better now but previously would get them every other day when she is at rest and gets up from the chair. She uses a cane with helps. Endorsing numbness and tingling occasionally that she attributes to her neuropathy. Takes tizanidine for muscle spasms which helps.   OBJECTIVE:   BP (!) 126/55   Pulse 78   Ht 5' 8"  (1.727 m)   Wt 232 lb 4 oz (105.3 kg)   SpO2 98%   BMI 35.31 kg/m   General: Patient well-appearing, in no acute distress. CV: RRR, no murmurs or gallops auscultated  Resp: CTAB, no wheezing or rales noted Ext: no calf tenderness bilaterally, no LE edema noted bilaterally, radial and distal pulses strong and equal bilaterally, gross sensation and reflexes normal of feet bilaterally, normal microfilament testing, no wounds noted on foot exam  Psych: mood appropriate   ASSESSMENT/PLAN:   Hypertension associated with diabetes (HCC) -BP 126/55, well-controlled -continue current anti-hypertensive regimen -encouraged to check blood pressure periodically, guidelines discussed on appropriate BP monitoring  Hyperlipidemia associated with type 2 diabetes mellitus ([place]) -med rec updated thoroughly as patient brought in medications -continue  atorvastatin 40 mg daily  -diet and exercise counseling provided   Type 2 diabetes, controlled, with neuropathy (HCC) -A1c 8.4, poorly controlled -increased metformin to 1000 mg bid  -up to date on foot and ophthalmology exams -diet and exercise counseling provided  -leg cramps likely secondary to neuropathic changes from uncontrolled diabetes, continue gabapentin 100 mg, reassuringly this has resolved, not concerning for DVT -follow up in 3 months for repeat A1c, consider starting insulin or farxiga if poorly controlled at follow up visit    -PHQ-9 score of 2 with negative question 9 reviewed.   Donney Dice, [place]

## 2021-05-29 NOTE — Assessment & Plan Note (Signed)
-  BP 126/55, well-controlled -continue current anti-hypertensive regimen -encouraged to check blood pressure periodically, guidelines discussed on appropriate BP monitoring

## 2021-06-01 ENCOUNTER — Other Ambulatory Visit: Payer: Self-pay

## 2021-06-01 ENCOUNTER — Ambulatory Visit (INDEPENDENT_AMBULATORY_CARE_PROVIDER_SITE_OTHER): Payer: Medicare Other

## 2021-06-01 DIAGNOSIS — Z23 Encounter for immunization: Secondary | ICD-10-CM | POA: Diagnosis present

## 2021-06-12 ENCOUNTER — Ambulatory Visit (INDEPENDENT_AMBULATORY_CARE_PROVIDER_SITE_OTHER): Payer: Medicare Other

## 2021-06-12 VITALS — Ht 68.0 in | Wt 236.0 lb

## 2021-06-12 DIAGNOSIS — Z0000 Encounter for general adult medical examination without abnormal findings: Secondary | ICD-10-CM

## 2021-06-12 NOTE — Progress Notes (Signed)
Subjective:   [name] is a 65 y.o. female who presents for Medicare Annual (Subsequent) preventive examination.  Patient consented to have virtual visit and was identified by name and date of birth. Method of visit: Telephone  Encounter participants: Patient: [name] - located at Home Nurse/Provider: Dorna Bloom - located at [facility] Others (if applicable): NA  Review of Systems: Defer to PCP.  Cardiac Risk Factors include: advanced age (>97mn, >>60women);obesity (BMI >30kg/m2);hypertension;diabetes mellitus  Objective:   Vitals: Ht 5' 8"  (1.727 m)    Wt 236 lb (107 kg)    BMI 35.88 kg/m   Body mass index is 35.88 kg/m.  Advanced Directives 06/12/2021 05/29/2021 05/16/2021 02/15/2021 12/27/2020 11/16/2020 09/07/2020  Does Patient Have a Medical Advance Directive? No No No No No No No  Would patient like information on creating a medical advance directive? Yes (MAU/Ambulatory/Procedural Areas - Information given) No - Patient declined - No - Patient declined No - Patient declined No - Patient declined No - Patient declined  Pre-existing out of facility DNR order (yellow form or pink MOST form) - - - - - - -   Tobacco Social History   Tobacco Use  Smoking Status Former   Packs/day: 2.00   Years: 24.00   Pack years: 48.00   Types: Cigarettes   Start date: 06/24/1970   Quit date: 08/06/1994   Years since quitting: 26.8  Smokeless Tobacco Never     Counseling given: No plans to restart  Clinical Intake:  Pre-visit preparation completed: Yes  Pain Score: 2   How often do you need to have someone help you when you read instructions, pamphlets, or other written materials from your doctor or pharmacy?: 2 - Rarely What is the last grade level you completed in school?: High School  Interpreter Needed?: No   Past Medical History:  Diagnosis Date   Advanced directives, counseling/discussion 04/12/2020   Allergy    Anemia    ANEMIA, PERNICIOUS, HX OF 05/13/2007    Anxiety    Arthritis    ASYMPTOMATIC POSTMENOPAUSAL STATUS 02/18/2008   B12 deficiency 12/13/2016   BACK PAIN, LUMBAR 11/19/2007   Blood in urine 07/19/2019   Bowel incontinence    Cataract    Cellulitis of left lower leg 10/28/2013   Cellulitis of leg, right 10/11/2010   Chronic depression 10/06/2007   Chronic dermatitis of hands 05/02/2009   Coronary artery disease involving native coronary artery of native heart without angina pectoris 07/07/2018   Last Assessment & Plan:  Formatting of this note might be different from the original. The patient reports remote cardiac catheterization with up to 40% plaque.  Subsequent Cardiolite stress test in October 2019 did not show any evidence of active ischemia or prior infarction. Formatting of this note might be different from the original. medical  Last Assessment & Plan:  Formatting of this note mi   DEPRESSION, CHRONIC 10/06/2007   Diabetes mellitus without complication ([place]    DIABETES MELLITUS, WITH NEUROLOGICAL COMPLICATIONS 88/58/8502  Diabetic neuropathy ([place]    DYSLIPIDEMIA 05/13/2007   Dyslipidemia 04/15/2017   Last Assessment & Plan:  The patient will continue atorvastatin for her dyslipidemia.   Dyslipidemia associated with type 2 diabetes mellitus ([place] 07/11/2018   Essential hypertension 03/20/2010   Family history of colon cancer    Fecal incontinence 08/02/2015   Frozen shoulder    Lt   Full dentures    Gastroesophageal reflux disease without esophagitis 04/15/2017   Generalized  abdominal pain 02/05/2018   Genetic testing 11/24/2017   Negative genetic testing on the common hereditary cancer panel.  The Hereditary Gene Panel offered by [facility] includes sequencing and/or deletion duplication testing of the following 47 genes: APC, ATM, AXIN2, BARD1, BMPR1A, BRCA1, BRCA2, BRIP1, CDH1, CDK4, CDKN2A (p14ARF), CDKN2A (p16INK4a), CHEK2, CTNNA1, DICER1, EPCAM (Deletion/duplication testing only), GREM1 (promoter region deletion/duplicat   GERD  (gastroesophageal reflux disease)    GI bleed 03/15/2016   GOITER, MULTINODULAR 05/13/2007   GOITER, MULTINODULAR 05/13/2007    (last TSH 3.26; US soft tissue neck in 09/2004 showed multinodular goiter with specific small nodule for which was recommended to be followed by repeat US in 3-6 months 12/2016: Thyroid US: recommend repeat in 1 year (one nodule 1.7cm on left thyroid)    Headache    History of blood transfusion    Hx of colonic polyps 12/04/2010   HYPERCHOLESTEROLEMIA 03/20/2010   Hyperparathyroidism ([place]) 04/11/2020   HYPERTENSION 03/20/2010   Hypertension associated with diabetes ([place]) 07/11/2018   Hypothyroidism 07/19/2019   Iron deficiency anemia 06/19/2012   Low back pain 03/05/2012   Completed PT-notes say very motivated and had good progress.   CT (03/2020: Mild curvature convex to the right in the upper lumbar region into left in lower lumbar region. No antero or retrolisthesis in the supine position. retrolisthesis at L3-4 of 3 mm with flexion which increases to 6 mm with neutral and extension. Multifactorial spinal stenosis at L3-4 due to circumferential protrusion of the    Lower back pain    Major depression in remission ([place]) 07/07/2018   Last Assessment & Plan:  Mood has seemed good on sertraline.   Malabsorption 03/12/2016   NASH (nonalcoholic steatohepatitis)    Nausea 07/19/2019   Need for immunization against influenza 04/12/2020   No-show for appointment 03/08/2020   Nonobstructive CAD  08/26/2013   The patient has prior cardiac workup in 2006 when she underwent a stress test for abnormal EKG. The stress test was nonconclusive and she underwent cardiac catheterization that showed 30% stenosis in the LAD in 25% stenosis in RCA she was treated medically.     OA (osteoarthritis)    OBSTRUCTIVE SLEEP APNEA 06/12/2007   uses CPAP.   Pacemaker    Paroxysmal atrial fibrillation ([place]) 05/11/2018   Last Assessment & Plan:  Patient was noted to have episodes of paroxysmal atrial  fibrillation that was predominantly rate controlled and at a relatively low burden.  She is chronically on anticoagulation now with Eliquis.  She does not take any negative chronotropic agents with her sinus bradycardia.  If she has any breakthrough more prolonged tachycardic episodes and requires negative chronotropi   Peripheral autonomic neuropathy due to diabetes mellitus ([place]) 07/11/2018   PERIPHERAL NEUROPATHY 01/12/2009   Peripheral neuropathy 01/12/2009   Peripheral vascular disease ([place])    Presbycusis of both ears 04/11/2020   Primary osteoarthritis involving multiple joints 07/07/2018   Last Assessment & Plan:  Post op left tka and here for rehab as her husband is handicapped so she has limited help at home. Consitpation noted, some pain overnight, better with a pillow under her knee.   Primary osteoarthritis of left knee 04/23/2011   Previously seen by Dr. Alphonzo Severance. Will plan on repeat referral after trial injection.      PSORIASIS 05/28/2010   Rectal prolapse 05/24/2015   Rectovaginal fistula 05/25/2014   Renal insufficiency    stage 1 kd   S/P bariatric surgery-duodenal switch with sleeve gastrectomy 12/04/2013  S/P total knee arthroplasty, right 11/16/2019   Screening mammogram, encounter for 07/12/2019   Second degree AV block 02/08/2020   Serum calcium elevated 04/12/2020   Short bowel syndrome 07/11/2018   Spigelian hernia 02/05/2018   Status post left knee replacement 07/11/2018   Stress 05/24/2015   Surgical counseling visit 10/02/2019   Tubular adenoma of colon 03/09/2016   Colonoscopy in 10/2015: 3  tubular adenomas; GI recommends repeat colonoscopy in 3 years Colonoscopy August 2012: 3 tubular adenomas.    Type 2 diabetes, controlled, with neuropathy ([place]) 8/92/1194   Umbilical hernia without obstruction and without gangrene 04/15/2017   Unilateral primary osteoarthritis, left knee    UNSPECIFIED VENOUS INSUFFICIENCY 05/28/2010   Patient with frequent ulceration related to  venous stasis-seen at wound care. Edema and Venous stasis changes due to this . Echo 04/16/11 with EF 60%. No signs diastolic dysfunction.        Vaccination against Streptococcus pneumoniae within past 5 years 04/12/2020   Vaginal irritation 07/19/2019   Venous stasis dermatitis of right lower extremity 01/02/2018   Vitamin D deficiency 03/09/2016   Wears glasses    Past Surgical History:  Procedure Laterality Date   CATARACT EXTRACTION W/ INTRAOCULAR LENS  IMPLANT, BILATERAL     COLONOSCOPY     ELECTROCARDIOGRAM  04/16/2006   EXAMINATION UNDER ANESTHESIA N/A 06/29/2014   Procedure: EXAM UNDER ANESTHESIA;  Surgeon: Janyth Contes, MD;  Location: [facility];  Service: Gynecology;  Laterality: N/A;   Exercise myoview  01/24/2005   FLEXIBLE SIGMOIDOSCOPY     GASTRIC BYPASS  11/09/2013   GASTRIC BYPASS     JOINT REPLACEMENT     KNEE ARTHROSCOPY  2003   right   LAPAROSCOPY N/A 03/12/2016   Procedure: LAPAROSCOPIC ANASTOMOSIS OF INTESTINE (ENTEROENTEROSTOMY);  Surgeon: Ladora Daniel, MD;  Location: [facility];  Service: General;  Laterality: N/A;   LEG SURGERY Left    metal and pins in lower left leg   mrsa Right    arm   MULTIPLE TOOTH EXTRACTIONS     PACEMAKER LEADLESS INSERTION N/A 02/08/2020   Procedure: PACEMAKER LEADLESS INSERTION;  Surgeon: Thompson Grayer, MD;  Location: [place];  Service: Cardiovascular;  Laterality: N/A;   PARATHYROIDECTOMY Left 02/19/2021   Procedure: LEFT PARATHYROIDECTOMY WITH FROZEN SECTION;  Surgeon: Izora Gala, MD;  Location: [place];  Service: ENT;  Laterality: Left;   SHOULDER ARTHROSCOPY W/ ROTATOR CUFF REPAIR Right    stab phlebectomy  Right 02/10/2019   stab phlebectomy > 20 incisions right leg by [name] MD    TONSILLECTOMY     TOTAL KNEE ARTHROPLASTY Left 06/30/2018   Procedure: LEFT TOTAL KNEE ARTHROPLASTY;  Surgeon: Meredith Pel, MD;  Location: [place];  Service: Orthopedics;  Laterality: Left;   TOTAL KNEE ARTHROPLASTY Right  11/16/2019   Procedure: RIGHT TOTAL KNEE ARTHROPLASTY-CEMENTED;  Surgeon: Meredith Pel, MD;  Location: [place];  Service: Orthopedics;  Laterality: Right;   VARICOSE VEIN SURGERY     Remotef   Family History  Problem Relation Age of Onset   Hyperlipidemia Father    Hypertension Father    Cirrhosis Father    Colon cancer Father 64       d. 62   Colon cancer Sister 86       d. 33   Bipolar disorder Sister    Aneurysm Mother        brain   Cerebral aneurysm Mother    Colon cancer Sister 59  d. 49   Bipolar disorder Sister    Cancer Paternal Aunt        NOS, ? colon   Congestive Heart Failure Maternal Grandmother    Drug abuse Neg Hx    CAD Neg Hx    Stomach cancer Neg Hx    Hyperparathyroidism Neg Hx    Hypercalcemia Neg Hx    Social History   Socioeconomic History   Marital status: Married    Spouse name: Marya Amsler   Number of children: 2   Years of education: 12   Highest education level: High school graduate  Occupational History   Occupation: Unemployed    Comment: disabled  Tobacco Use   Smoking status: Former    Packs/day: 2.00    Years: 24.00    Pack years: 48.00    Types: Cigarettes    Start date: 06/24/1970    Quit date: 08/06/1994    Years since quitting: 26.8   Smokeless tobacco: Never  Vaping Use   Vaping Use: Never used  Substance and Sexual Activity   Alcohol use: No    Alcohol/week: 0.0 standard drinks   Drug use: No   Sexual activity: Not Currently  Other Topics Concern   Not on file  Social History Narrative   Patient lives with husband in [place]. Marya Amsler is a [facility] patient.    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    Daughter lives in nearby and is a great help.   Son lives in [place].    2 dogs and 1 cat.   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   Social Determinants of Health   Financial Resource Strain: Low Risk    Difficulty of Paying Living Expenses: Not hard at all  Food Insecurity: No Food  Insecurity   Worried About Charity fundraiser in the Last Year: Never true   [place] in the Last Year: Never true  Transportation Needs: No Transportation Needs   Lack of Transportation (Medical): No   Lack of Transportation (Non-Medical): No  Physical Activity: Inactive   Days of Exercise per Week: 0 days   Minutes of Exercise per Session: 0 min  Stress: Stress Concern Present   Feeling of Stress : To some extent  Social Connections: Moderately Isolated   Frequency of Communication with Friends and Family: More than three times a week   Frequency of Social Gatherings with Friends and Family: More than three times a week   Attends Religious Services: Never   Marine scientist or Organizations: No   Attends Archivist Meetings: Never   Marital Status: Married   Outpatient Encounter Medications as of 06/12/2021  Medication Sig   amLODipine (NORVASC) 10 MG tablet Take 1 tablet (10 mg total) by mouth daily.   apixaban (ELIQUIS) 5 MG TABS tablet Take 1 tablet (5 mg total) by mouth 2 (two) times daily.   atorvastatin (LIPITOR) 40 MG tablet Take 40 mg by mouth daily.   gabapentin (NEURONTIN) 100 MG capsule Take 1 capsule (100 mg total) by mouth in the morning.   gabapentin (NEURONTIN) 300 MG capsule TAKE 1 CAPSULE BY MOUTH THREE TIMES A DAY (Patient taking differently: Take 300 mg by mouth at bedtime.)   metFORMIN (GLUCOPHAGE) 1000 MG tablet Take 1 tablet (1,000 mg total) by mouth daily with breakfast.   Semaglutide,0.25 or 0.5MG/DOS, (OZEMPIC, 0.25 OR 0.5 MG/DOSE,) 2 MG/1.5ML SOPN Inject 0.25 mg into the skin once a week. This  is a patient assistance medication. Patient may not be approved and/or have medication. Please ask and verify when performing med review.   sertraline (ZOLOFT) 100 MG tablet Take 1 tablet (100 mg total) by mouth daily.   spironolactone (ALDACTONE) 25 MG tablet Take 1 tablet (25 mg total) by mouth daily. NEED OV.   Teduglutide, rDNA, 5 MG KIT  Inject 5 mg into the skin daily.    tiZANidine (ZANAFLEX) 4 MG capsule Take 1 capsule (4 mg total) by mouth 3 (three) times daily as needed for muscle spasms.   Accu-Chek Softclix Lancets lancets Use as instructed once daily (Patient not taking: Reported on 06/12/2021)   blood glucose meter kit and supplies KIT Dispense based on patient and insurance preference. Use up to four times daily as directed. (Patient not taking: Reported on 06/12/2021)   Blood Glucose Monitoring Suppl (ACCU-CHEK GUIDE) w/Device KIT Use directed (Patient not taking: Reported on 06/12/2021)   diclofenac Sodium (VOLTAREN) 1 % GEL Apply 2 g topically 4 (four) times daily. (Patient not taking: Reported on 04/27/2021)   furosemide (LASIX) 40 MG tablet Take 1 tablet (40 mg total) by mouth daily. (Patient not taking: Reported on 04/27/2021)   glucose blood (ACCU-CHEK AVIVA PLUS) test strip Use to check blood glucose daily (Patient not taking: Reported on 06/12/2021)   HYDROcodone-acetaminophen (NORCO) 7.5-325 MG tablet Take 1 tablet by mouth every 6 (six) hours as needed for moderate pain. (Patient not taking: Reported on 04/27/2021)   lisinopril-hydrochlorothiazide (ZESTORETIC) 20-25 MG tablet Take 1 tablet by mouth daily. (Patient not taking: Reported on 06/12/2021)   ondansetron (ZOFRAN ODT) 8 MG disintegrating tablet Take 1 tablet (8 mg total) by mouth every 8 (eight) hours as needed for nausea or vomiting. (Patient not taking: Reported on 04/27/2021)   No facility-administered encounter medications on file as of 06/12/2021.   Activities of Daily Living In your present state of health, do you have any difficulty performing the following activities: 06/12/2021 02/15/2021  Hearing? N Y  Vision? N N  Difficulty concentrating or making decisions? N Y  Walking or climbing stairs? N Y  Dressing or bathing? N N  Doing errands, shopping? N N  Preparing Food and eating ? N -  Using the Toilet? N -  In the past six months, have you  accidently leaked urine? N -  Do you have problems with loss of bowel control? N -  Managing your Medications? N -  Managing your Finances? N -  Housekeeping or managing your Housekeeping? N -  Some recent data might be hidden   Patient Care Team: Donney Dice, DO as PCP - General (Family Medicine) Kennith Center, RD as Dietitian (Family Medicine) Macarthur Critchley, [place] as Referring Physician (Optometry) Duke Salvia, Venia Carbon, MD as Attending Physician [facility]) Marlou Sa, Tonna Corner, MD as Consulting Physician (Orthopedic Surgery) Lloyd Huger, MD as Consulting Physician (Oncology)    Assessment:   This is a routine wellness examination for [place].  Exercise Activities and Dietary recommendations Current Exercise Habits: The patient has a physically strenuous job, but has no regular exercise apart from work. (caregiver for husband with MS), Exercise limited by: orthopedic condition(s)   Goals       Blood Pressure < 140/90      Exercise 3x per week (30 min per time) (pt-stated)      HEMOGLOBIN A1C < 7.0      Weight (lb) < 198 lb (89.8 kg)      7% weight loss  Fall Risk Fall Risk  06/12/2021 05/16/2021 01/09/2021 12/27/2020 09/07/2020  Falls in the past year? 1 0 1 0 1  Number falls in past yr: 1 0 1 0 0  Injury with Fall? 1 0 1 0 1  Risk Factor Category  - - - - -  Risk for fall due to : Impaired balance/gait;History of fall(s) - - - -  Follow up Falls prevention discussed - - Falls evaluation completed -  Comment - - - - -   Patient does report a fall this year due to impaired balance/gait. Dicussed usage of assistive devices.   Is the patient's home free of loose throw rugs in walkways, pet beds, electrical cords, etc?   yes      Grab bars in the bathroom? yes      Handrails on the stairs?   yes      Adequate lighting?   yes  Patient rating of health (0-10) scale: 7   Depression Screen PHQ 2/9 Scores 06/12/2021 05/29/2021 03/16/2021 01/09/2021  PHQ - 2 Score 0 0 0 0   PHQ- 9 Score 2 2 0 1  Exception Documentation - - - -  Not completed - - - -    Cognitive Function   Montreal Cognitive Assessment  09/11/2020  Visuospatial/ Executive (0/5) 5  Naming (0/3) 2  Attention: Read list of digits (0/2) 2  Attention: Read list of letters (0/1) 1  Attention: Serial 7 subtraction starting at 100 (0/3) 3  Language: Repeat phrase (0/2) 1  Language : Fluency (0/1) 1  Abstraction (0/2) 1  Delayed Recall (0/5) 4  Orientation (0/6) 6  Total 26  Adjusted Score (based on education) 27   6CIT Screen 06/12/2021 11/10/2018  What Year? 0 points 0 points  What month? 0 points 0 points  What time? 0 points 0 points  Count back from 20 0 points 0 points  Months in reverse 0 points 0 points  Repeat phrase 0 points 0 points  Total Score 0 0   Immunization History  Administered Date(s) Administered   Fluad Quad(high Dose 65+) 03/16/2021   Influenza Split 04/23/2011, 04/03/2012   Influenza,inj,Quad PF,6+ Mos 03/01/2013, 04/14/2014, 07/25/2015, 02/20/2016, 02/25/2017, 03/04/2018, 02/19/2019, 04/04/2020   Moderna Sars-Covid-2 Vaccination 09/04/2019, 10/06/2019, 04/08/2020   PFIZER Comirnaty(Gray Top)Covid-19 Tri-Sucrose Vaccine 11/16/2020   PNEUMOCOCCAL CONJUGATE-20 06/01/2021   Pneumococcal Conjugate-13 04/03/2020   Pneumococcal Polysaccharide-23 07/12/2010, 11/17/2015   Td 01/18/2005   Tdap 09/17/2011   Qualifies for Shingles Vaccine? Yes   Shingrix Completed: No, Education has been provided regarding the importance of this vaccine. Advised may receive this vaccine at local pharmacy or Health Dept. Aware to provide a copy of the vaccination record if obtained from local pharmacy or Health Dept. Verbalized acceptance and understanding.  Screening Tests Health Maintenance  Topic Date Due   Zoster Vaccines- Shingrix (1 of 2) Never done   COVID-19 Vaccine (5 - Booster for Moderna series) 01/11/2021   MAMMOGRAM  07/31/2021   TETANUS/TDAP  09/16/2021   HEMOGLOBIN  A1C  11/27/2021   OPHTHALMOLOGY EXAM  01/23/2022   FOOT EXAM  05/29/2022   COLONOSCOPY (Pts 45-41yr Insurance coverage will need to be confirmed)  12/17/2023   Pneumonia Vaccine 65 Years old  Completed   INFLUENZA VACCINE  Completed   DEXA SCAN  Completed   Hepatitis C Screening  Completed   HPV VACCINES  Aged Out   Cancer Screenings: Lung: Low Dose CT Chest recommended if Age 470-80years, 30 pack-year currently smoking  OR have quit w/in 15years. Patient does not qualify. Breast:  Up to date on Mammogram? Yes   Up to date of Bone Density/Dexa? Yes Colorectal: UTD- Due 2025  Additional Screenings Hepatitis C Screening: Completed  HIV Screening: Completed  Plan:  Pharmacy apt on 06/27/2021. Will get Bivalent Booster then.  PCP apt due in March 2023. If interested in Shingles vaccine we can send this to your local pharmacy.   I have personally reviewed and noted the following in the patients chart:   Medical and social history Use of alcohol, tobacco or illicit drugs  Current medications and supplements Functional ability and status Nutritional status Physical activity Advanced directives List of other physicians Hospitalizations, surgeries, and ER visits in previous 12 months Vitals Screenings to include cognitive, depression, and falls Referrals and appointments  In addition, I have reviewed and discussed with patient certain preventive protocols, quality metrics, and best practice recommendations. A written personalized care plan for preventive services as well as general preventive health recommendations were provided to patient.  Dorna Bloom, [place]  06/20/2021

## 2021-06-20 ENCOUNTER — Ambulatory Visit
Admission: RE | Admit: 2021-06-20 | Discharge: 2021-06-20 | Disposition: A | Discharge status: Home / Self Care | Payer: Medicare Other | Source: Ambulatory Visit | Attending: Neurology | Admitting: Neurology

## 2021-06-20 ENCOUNTER — Other Ambulatory Visit: Payer: Self-pay

## 2021-06-20 DIAGNOSIS — R2681 Unsteadiness on feet: Secondary | ICD-10-CM

## 2021-06-20 NOTE — Progress Notes (Signed)
I have reviewed this visit and agree with the documentation.   

## 2021-06-20 NOTE — Patient Instructions (Addendum)
You spoke to [name], [place] over the phone for your annual wellness visit.  We discussed goals:   Goals       Blood Pressure < 140/90      Exercise 3x per week (30 min per time) (pt-stated)      HEMOGLOBIN A1C < 7.0      Weight (lb) < 198 lb (89.8 kg)      7% weight loss       We also discussed recommended health maintenance. Please call our office and schedule a visit. As discussed, you are due for:  Health Maintenance  Topic Date Due   Zoster Vaccines- Shingrix (1 of 2) Never done   COVID-19 Vaccine (5 - Booster for Moderna series) 01/11/2021   MAMMOGRAM  07/31/2021   TETANUS/TDAP  09/16/2021   HEMOGLOBIN A1C  11/27/2021   OPHTHALMOLOGY EXAM  01/23/2022   FOOT EXAM  05/29/2022   COLONOSCOPY (Pts 45-80yr Insurance coverage will need to be confirmed)  12/17/2023   Pneumonia Vaccine 66 Years old  Completed   INFLUENZA VACCINE  Completed   DEXA SCAN  Completed   Hepatitis C Screening  Completed   HPV VACCINES  Aged Out   Pharmacy apt on 06/27/2021. Will get Bivalent Booster then.  PCP apt due in March 2023. If interested in Shingles vaccine we can send this to your local pharmacy.   Health Maintenance, Female Adopting a healthy lifestyle and getting preventive care are important in promoting health and wellness. Ask your health care provider about: The right schedule for you to have regular tests and exams. Things you can do on your own to prevent diseases and keep yourself healthy. What should I know about diet, weight, and exercise? Eat a healthy diet  Eat a diet that includes plenty of vegetables, fruits, low-fat dairy products, and lean protein. Do not eat a lot of foods that are high in solid fats, added sugars, or sodium. Maintain a healthy weight Body mass index (BMI) is used to identify weight problems. It estimates body fat based on height and weight. Your health care provider can help determine your BMI and help you achieve or maintain a healthy  weight. Get regular exercise Get regular exercise. This is one of the most important things you can do for your health. Most adults should: Exercise for at least 150 minutes each week. The exercise should increase your heart rate and make you sweat (moderate-intensity exercise). Do strengthening exercises at least twice a week. This is in addition to the moderate-intensity exercise. Spend less time sitting. Even light physical activity can be beneficial. Watch cholesterol and blood lipids Have your blood tested for lipids and cholesterol at 66years of age, then have this test every 5 years. Have your cholesterol levels checked more often if: Your lipid or cholesterol levels are high. You are older than 66years of age. You are at high risk for heart disease. What should I know about cancer screening? Depending on your health history and family history, you may need to have cancer screening at various ages. This may include screening for: Breast cancer. Cervical cancer. Colorectal cancer. Skin cancer. Lung cancer. What should I know about heart disease, diabetes, and high blood pressure? Blood pressure and heart disease High blood pressure causes heart disease and increases the risk of stroke. This is more likely to develop in people who have high blood pressure readings or are overweight. Have your blood pressure checked: Every 3-5 years if you are  81-4 years of age. Every year if you are 69 years old or older. Diabetes Have regular diabetes screenings. This checks your fasting blood sugar level. Have the screening done: Once every three years after age 80 if you are at a normal weight and have a low risk for diabetes. More often and at a younger age if you are overweight or have a high risk for diabetes. What should I know about preventing infection? Hepatitis B If you have a higher risk for hepatitis B, you should be screened for this virus. Talk with your health care provider to  find out if you are at risk for hepatitis B infection. Hepatitis C Testing is recommended for: Everyone born from [birth year] through 1966. Anyone with known risk factors for hepatitis C. Sexually transmitted infections (STIs) Get screened for STIs, including gonorrhea and chlamydia, if: You are sexually active and are younger than 66 years of age. You are older than 66 years of age and your health care provider tells you that you are at risk for this type of infection. Your sexual activity has changed since you were last screened, and you are at increased risk for chlamydia or gonorrhea. Ask your health care provider if you are at risk. Ask your health care provider about whether you are at high risk for HIV. Your health care provider may recommend a prescription medicine to help prevent HIV infection. If you choose to take medicine to prevent HIV, you should first get tested for HIV. You should then be tested every 3 months for as long as you are taking the medicine. Pregnancy If you are about to stop having your period (premenopausal) and you may become pregnant, seek counseling before you get pregnant. Take 400 to 800 micrograms (mcg) of folic acid every day if you become pregnant. Ask for birth control (contraception) if you want to prevent pregnancy. Osteoporosis and menopause Osteoporosis is a disease in which the bones lose minerals and strength with aging. This can result in bone fractures. If you are 27 years old or older, or if you are at risk for osteoporosis and fractures, ask your health care provider if you should: Be screened for bone loss. Take a calcium or vitamin D supplement to lower your risk of fractures. Be given hormone replacement therapy (HRT) to treat symptoms of menopause. Follow these instructions at home: Alcohol use Do not drink alcohol if: Your health care provider tells you not to drink. You are pregnant, may be pregnant, or are planning to become pregnant. If you  drink alcohol: Limit how much you have to: 0-1 drink a day. Know how much alcohol is in your drink. In the [place] one drink equals one 12 oz bottle of beer (355 mL), one 5 oz glass of wine (148 mL), or one 1 oz glass of hard liquor (44 mL). Lifestyle Do not use any products that contain nicotine or tobacco. These products include cigarettes, chewing tobacco, and vaping devices, such as e-cigarettes. If you need help quitting, ask your health care provider. Do not use street drugs. Do not share needles. Ask your health care provider for help if you need support or information about quitting drugs. General instructions Schedule regular health, dental, and eye exams. Stay current with your vaccines. Tell your health care provider if: You often feel depressed. You have ever been abused or do not feel safe at home. Summary Adopting a healthy lifestyle and getting preventive care are important in promoting health and wellness. Follow your  health care provider's instructions about healthy diet, exercising, and getting tested or screened for diseases. Follow your health care provider's instructions on monitoring your cholesterol and blood pressure. This information is not intended to replace advice given to you by your health care provider. Make sure you discuss any questions you have with your health care provider. Document Revised: 10/30/2020 Document Reviewed: 10/30/2020 Elsevier Patient Education  [place].   Diet Recommendations for Diabetes   1. Eat at least 3 meals and 1-2 snacks per day. Never go more than 4-5 hours while awake without eating. Eat breakfast within the first hour of getting up.   2. Limit starchy foods to TWO per meal and ONE per snack. ONE portion of a starchy  food is equal to the following:   - ONE slice of bread (or its equivalent, such as half of a hamburger bun).   - 1/2 cup of a "scoopable" starchy food such as potatoes or rice.   - 15 grams of Total  Carbohydrate as shown on food label.  3. Include at every meal: a protein food, a carb food, and vegetables and/or fruit.   - Obtain twice the volume of vegetables as protein or carbohydrate foods for both lunch and dinner.   - Fresh or frozen vegetables are best.   - Keep frozen vegetables on hand for a quick vegetable serving.       Starchy (carb) foods: Bread, rice, pasta, potatoes, corn, cereal, grits, crackers, bagels, muffins, all baked goods.  (Fruits, milk, and yogurt also have carbohydrate, but most of these foods will not spike your blood sugar as most starchy foods will.)  A few fruits do cause high blood sugars; use small portions of bananas (limit to 1/2 at a time), grapes, watermelon, oranges, and most tropical fruits.    Protein foods: Meat, fish, poultry, eggs, dairy foods, and beans such as pinto and kidney beans (beans also provide carbohydrate).        Our clinic's number is [phone]. Please call with questions or concerns about what we discussed today.

## 2021-06-27 ENCOUNTER — Other Ambulatory Visit: Payer: Self-pay

## 2021-06-27 ENCOUNTER — Ambulatory Visit (INDEPENDENT_AMBULATORY_CARE_PROVIDER_SITE_OTHER): Payer: Medicare Other | Admitting: Pharmacist

## 2021-06-27 VITALS — Wt 239.2 lb

## 2021-06-27 DIAGNOSIS — E1140 Type 2 diabetes mellitus with diabetic neuropathy, unspecified: Secondary | ICD-10-CM

## 2021-06-27 DIAGNOSIS — E1142 Type 2 diabetes mellitus with diabetic polyneuropathy: Secondary | ICD-10-CM

## 2021-06-27 MED ORDER — ACCU-CHEK GUIDE VI STRP: ORAL_STRIP | 12 refills | Status: DC

## 2021-06-27 NOTE — Assessment & Plan Note (Signed)
°  T2DM is not controlled based on recent A1C likely due to issues with medication adherence. Unable to increase Ozempic to 16m at this time as we are awaiting patient assistance renewal. Will continue 0.528monce weekly for time being working towards max dose of 54m34mFollowing instruction patient verbalized understanding of treatment plan.    1. Continued GLP-1 Ozempic to 0.5mg83mce weekly on Wednesdays 2. Continued metformin 1000mg35mly 3. Extensively discussed pathophysiology of diabetes, dietary effects on blood sugar control, and recommended lifestyle interventions.  4. Counseled on s/sx of and management

## 2021-06-27 NOTE — Assessment & Plan Note (Signed)
>>  ASSESSMENT AND PLAN FOR TYPE 2 DM WITH DIABETIC NEUROPATHY AFFECTING BOTH SIDES OF BODY (HCC) WRITTEN ON 06/27/2021 10:11 AM BY KELLEY, RACHELLE, RPH-CPP   T2DM is not controlled based on recent A1C likely due to issues with medication adherence. Unable to increase Ozempic  to 1mg  at this time as we are awaiting patient assistance renewal. Will continue 0.5mg  once weekly for time being working towards max dose of 2mg . Following instruction patient verbalized understanding of treatment plan.    Continued GLP-1 Ozempic  to 0.5mg  once weekly on Wednesdays Continued metformin  1000mg  daily Extensively discussed pathophysiology of diabetes, dietary effects on blood sugar control, and recommended lifestyle interventions.  Counseled on s/sx of and management

## 2021-06-27 NOTE — Patient Instructions (Addendum)
[name] it was a pleasure seeing you today.   Please do the following:  Continue 0.4m Ozempic once weekly as directed today during your appointment. If you have any questions or if you believe something has occurred because of this change, call me or your doctor to let one of u[place]oreaknow.  Continue checking blood sugars at home. It's really important that you record these and bring these in to your next doctor's appointment.  Continue making the lifestyle changes we've discussed together during our visit. Diet and exercise play a significant role in improving your blood sugars.  Follow-up with me when you receive a call about your Ozempic being at the clinic Please check with pharmacy about your blood sugar meter. If you have issues call clinic back At next visit please bring in all your medications   Hypoglycemia or low blood sugar:   Low blood sugar can happen quickly and may become an emergency if not treated right away.   While this shouldn't happen often, it can be brought upon if you skip a meal or do not eat enough. Also, if your insulin or other diabetes medications are dosed too high, this can cause your blood sugar to go to low.   Warning signs of low blood sugar include: Feeling shaky or dizzy Feeling weak or tired  Excessive hunger Feeling anxious or upset  Sweating even when you aren't exercising  What to do if I experience low blood sugar? Follow the Rule of 15 Check your blood sugar with your meter. If lower than 70, proceed to step 2.  Treat with 15 grams of fast acting carbs which is found in 3-4 glucose tablets. If none are available you can try hard candy, 1 tablespoon of sugar or honey,4 ounces of fruit juice, or 6 ounces of REGULAR soda.  Re-check your sugar in 15 minutes. If it is still below 70, do what you did in step 2 again. If your blood sugar has come back up, go ahead and eat a snack or small meal made up of complex carbs (ex. Whole grains) and protein at this  time to avoid recurrence of low blood sugar.

## 2021-06-27 NOTE — Progress Notes (Signed)
Subjective:    Patient ID: [name], female    DOB: August 26, 1955, 66 y.o.   MRN: [id number]  HPI Patient is a 66 y.o. female who presents for diabetes management. She is in good spirits and presents without assistance. Patient was referred on 01/09/21 and last seen by primary care provider on 05/29/21. Last seen in pharmacy clinic on 05/23/21.  Patient reports tolerating Ozempic well and has not had any issues. She has still not obtained her glucometer.  Patient reports diabetes was diagnosed around 26 years ago.  Insurance coverage/medication affordability: Medicare A/B  Family/Social history: patient's husband is wheelchair bound due to MS  Current diabetes medications include: metformin 1028m, ozempic 0.559m(completed 5 doses) once weekly on Wednesdays Current hypertension medications include: amlodipine 1070mlisinopril-HCTZ 20-32m90mpironolactone 32mg48mrent hyperlipidemia medications include: atorvastatin 40mg 77ment states that She is taking her medications as prescribed. Patient denies adherence with medications. Patient states that She misses her medications 1-2 times per week, on average. Patient reports improvement in missed doses since our visit in July in which we set alarms to help the patient remember to take her medications  Do you feel that your medications are working for you?  "I don't feel like the sertraline is doing anything"  Have you been experiencing any side effects to the medications prescribed? no  Do you have any problems obtaining medications due to transportation or finances?  no   Patient reported dietary habits:  Patient reports that she likes vegetables and tries to eat healthy overall but struggles with late night snacking of pork rinds.  Patient-reported exercise habits: patient recently bought an exercise bike and has been using that around 30 minutes a few times a day.   Patient reports she may have had some hypoglycemic events but unsure due  to not having glucometer. Patient reports polyuria (increased urination).  Patient denies polyphagia (increased appetite).  Patient reports polydipsia (increased thirst).  Patient reports neuropathy (nerve pain) in feet and sometimes hands. Patient denies visual changes. Patient reports self foot exams.    Objective:   Labs:   Physical Exam Neurological:     Mental Status: She is alert and oriented to person, place, and time.    Review of Systems  Gastrointestinal:  Negative for abdominal pain, diarrhea, nausea and vomiting.   Vitals:   06/27/21 0911  Weight: 239 lb 3.2 oz (108.5 kg)  BMI (Calculated): 36.38     Lab Results  Component Value Date   HGBA1C 8.4 (A) 05/29/2021   HGBA1C 7.7 (H) 02/19/2021   HGBA1C 7.5 (A) 01/09/2021    Lab Results  Component Value Date   MICRALBCREAT 55.0 (H) 10/30/2017    Lipid Panel     Component Value Date/Time   CHOL 185 11/16/2020 1216   CHOL 189 08/28/2016 0000   TRIG 84 11/16/2020 1216   TRIG 63 08/28/2016 0000   TRIG 184 (H) 04/09/2006 0904   HDL 58 11/16/2020 1216   HDL 75 08/28/2016 0000   CHOLHDL 3.2 11/16/2020 1216   CHOLHDL 2.52 08/28/2016 0000   CHOLHDL 4.5 06/19/2012 1125   VLDL 61 (H) 06/19/2012 1125   LDLCALC 112 (H) 11/16/2020 1216   LDLDIRECT 113 (H) 07/05/2013 1512   The 10-year ASCVD risk score (Arnett DK, et al., 2019) is: 14.2%   Values used to calculate the score:     Age: 59 yea24     Sex: Female     Is Non-Hispanic African American: No  Diabetic: Yes     Tobacco smoker: No     Systolic Blood Pressure: 347 mmHg     Is BP treated: Yes     HDL Cholesterol: 58 mg/dL     Total Cholesterol: 185 mg/dL   Assessment/Plan:   T2DM is not controlled based on recent A1C likely due to issues with medication adherence. Unable to increase Ozempic to 15m at this time as we are awaiting patient assistance renewal. Will continue 0.551monce weekly for time being working towards max dose of 5m63mFollowing  instruction patient verbalized understanding of treatment plan.    Continued GLP-1 Ozempic to 0.5mg69mce weekly on Wednesdays Continued metformin 1000mg6mly Extensively discussed pathophysiology of diabetes, dietary effects on blood sugar control, and recommended lifestyle interventions.  Counseled on s/sx of and management of hypoglycemia Next A1C anticipated March 2023  Follow-up appointment when patient receives Ozempic 1mg f64m patient assistance program. Patient requesting appointment with PCP to discuss sertraline as she forgot at last appointment. Written patient instructions provided.  This appointment required 30 minutes of direct patient care.  Thank you for involving pharmacy to assist in providing this patient's care.  Medication Samples have been provided to the patient.  Drug name: Ozempic       Strength: 025mg/031m        Qty: 1 pen  LOT: [id number]: 10/22/23  RachellHughes BetterM 06/27/2021

## 2021-07-17 ENCOUNTER — Other Ambulatory Visit: Payer: Self-pay

## 2021-07-17 ENCOUNTER — Encounter: Payer: Self-pay | Admitting: Family Medicine

## 2021-07-17 ENCOUNTER — Ambulatory Visit (INDEPENDENT_AMBULATORY_CARE_PROVIDER_SITE_OTHER): Payer: Medicare Other | Admitting: Family Medicine

## 2021-07-17 VITALS — BP 148/82 | HR 77 | Ht 68.0 in | Wt 242.2 lb

## 2021-07-17 DIAGNOSIS — R4586 Emotional lability: Secondary | ICD-10-CM | POA: Insufficient documentation

## 2021-07-17 NOTE — Patient Instructions (Signed)
It was great seeing you today!  Today we discussed your mood, I think we should continue to take zoloft daily. But I also want you to start participating in routine therapy as it has been shown that both of these things combined (medication and therapy) will best help your mood.   Below is a list of therapists, please pick one and establish care at your earliest convenience. You may also visit [email or web address] to have more options.   Please follow up at your next scheduled appointment in 3 months, if anything arises between now and then, please don't hesitate to contact our office.   Thank you for allowing [place] to be a part of your medical care!  Thank you, Dr. Larae Grooms    Therapy and Counseling Resources Most providers on this list will take Medicaid. Patients with commercial insurance or Medicare should contact their insurance company to get a list of in network providers.  [facility]  [place], [place], [place] [address], [place] [email or web address] [phone]  [facility] [address] [place], [place] [address] [place], [place], [place] [address]      [place] [address]  [place], [place] [address] [phone]  [place] [address] [place]  [place], [place] [address]       [phone]     [facility] (virtual only) [phone]  [facility]inny Bl[facility] [address], [place], [place]  [facility]:  [place]. [place] [phone]  [facility]:  [place] [address] [phone]  [facility] (under & uninsured) [address] [place] [phone]    [email or web address]    [place] [address]  [place]    [phone]  [facility] [place]     Phone:  [phone]     [place] [facility]  [place] [address]      [place], [place] ext [place]: [place], [place], [place]   [place] ([place] therapist) [email or web address]  [place] [address]   [place] [address]    [phone]    [facility]   [address],  [place], [place]   [place]  [place]  [facility]  [address] [place], [place]        [phone]  Open Access/Walk In Clinic under & uninsured  [facility]  [address] [place], [place] Crisis [phone]  [facility] [place],  ([place])   [place], [place]: [phone]) 8:30 - 12; 1 - 2:30  [facility] the [place],  [place], [place]    [phone]):8:30 - 12; 2 - 3PM  [facility] [employer],  [address]  [place]; [phone]):   Mon - Fri 8 AM - 5 PM  [facility] [place]  MWF 12:30 to 3:00 or call to schedule an appointment  [phone]  Specific Provider options Psychology Today  [email or web address] click on find a therapist  enter your zip  code left side and select or tailor a therapist for your specific need.   [facility] Provider Directory [email or web address]  (Medicaid)   Follow all drop down to find a provider  [place] or [email or web address], [place], [place] Recovery support and educational   24- Hour Availability:   [facility]  [address] [place], [place] Crisis [phone]  [facility] of the [employer] [phone]  [place]  [phone]   [place]  [phone] (after hours)  [facility]/Mobile Crisis   [phone]  [place] National Suicide Hotline  [phone] Diamantina Monks)  Call 911 or go to emergency room  [facility]  [phone]);  [place] and [employer]  [phone]); [place], [place], [place], [place], [place], [place], [place]

## 2021-07-17 NOTE — Assessment & Plan Note (Addendum)
-  PHQ-9 score of 1 with negative question 9 reviewed and discussed -upon extensive discussion and no adverse effects, decided to continue zoloft as prescribed as it is improving her mood in some ways -explained the importance of therapy and medication for optimal improvement of mood changes, patient agreeable to participating in therapy  -list of therapists provided -reassurance provided  -follow up in 3 months or sooner as appropriate

## 2021-07-17 NOTE — Progress Notes (Signed)
° ° °  SUBJECTIVE:   CHIEF COMPLAINT / HPI:   Patient presents with concerns regarding zoloft. She has been on this for about 2 years. Would like to switch medications because she feels that it is not helping her mood, she feels that she is moody still. Denies any adverse effects. About 25 years ago, she was taking a different anti-depressant which she feels worked better but she does not remember the name of it. Stressors include ongoing support of wheelchair bound husband and feels like she lacks confidence. Not currently seeing a therapist. Support system is her children but tries not to bother them.   OBJECTIVE:   BP (!) 148/82    Pulse 77    Ht 5' 8"  (1.727 m)    Wt 242 lb 3.2 oz (109.9 kg)    SpO2 99%    BMI 36.83 kg/m   General: Patient well-appearing, in no acute distress. CV: RRR, no murmurs or gallops auscultated Resp: CTAB, no wheezing or rales noted Abdomen: soft, nontender, presence of bowel sounds Ext: radial pulses strong and equal bilaterally  Psych: mood appropriate,pleasant, denies SI or plan   ASSESSMENT/PLAN:   Mood changes -PHQ-9 score of 1 with negative question 9 reviewed and discussed -upon extensive discussion and no adverse effects, decided to continue zoloft as prescribed as it is improving her mood in some ways -explained the importance of therapy and medication for optimal improvement of mood changes, patient agreeable to participating in therapy  -list of therapists provided -reassurance provided  -follow up in 3 months or sooner as appropriate      Demoni Gergen Larae Grooms, [place]

## 2021-07-26 ENCOUNTER — Telehealth: Payer: Self-pay

## 2021-07-26 NOTE — Telephone Encounter (Signed)
Left voicemail with pt regarding patient assistance application with [employer]. Application is pending. 2023 income is needed. Will need both pt & spouses statement for this year to submit with application.   Call back [phone]

## 2021-08-09 ENCOUNTER — Ambulatory Visit (INDEPENDENT_AMBULATORY_CARE_PROVIDER_SITE_OTHER): Payer: Medicare Other

## 2021-08-09 DIAGNOSIS — I441 Atrioventricular block, second degree: Secondary | ICD-10-CM | POA: Diagnosis not present

## 2021-08-11 LAB — CUP PACEART REMOTE DEVICE CHECK
Battery Remaining Longevity: 96 mo
Battery Voltage: 3.02 V
Brady Statistic RV Percent Paced: 12.37 %
Date Time Interrogation Session: 20230217180200
Implantable Pulse Generator Implant Date: 20210817
Lead Channel Impedance Value: 480 Ohm
Lead Channel Pacing Threshold Amplitude: 0.625 V
Lead Channel Pacing Threshold Pulse Width: 0.4 ms
Lead Channel Sensing Intrinsic Amplitude: 21.488 mV
Lead Channel Setting Pacing Amplitude: 1.125
Lead Channel Setting Pacing Pulse Width: 0.4 ms
Lead Channel Setting Sensing Sensitivity: 2 mV

## 2021-08-14 NOTE — Progress Notes (Signed)
Remote pacemaker transmission.   

## 2021-08-22 NOTE — Telephone Encounter (Signed)
Patient returns call to nurse line regarding Ozempic. Patient states that she ran out of Ozempic last week. Patient reports that she brought documentation into our office regarding income.  ? ?Will forward to [place] to check status of patient assistance application.  ? ?Also, would we be able to provide patient with samples until this can be processed.  ? ?Talbot Grumbling, RN ? ?

## 2021-08-23 ENCOUNTER — Other Ambulatory Visit (HOSPITAL_COMMUNITY): Payer: Self-pay

## 2021-08-23 MED ORDER — OZEMPIC (0.25 OR 0.5 MG/DOSE) 2 MG/1.5ML ~~LOC~~ SOPN
0.5000 mg | PEN_INJECTOR | SUBCUTANEOUS | 1 refills | Status: DC
  Filled 2021-08-23: qty 1.5, 28d supply, fill #0

## 2021-08-23 NOTE — Telephone Encounter (Signed)
Pt returned phone call. ? ?Pt is emailing me her 2023 ssa statements for novo nordisk. Also informed her we do not have samples. Advised pt to fill her medication at [facility] since she has no more remaining & re-enrollment could take over a month to deliver to office. Her copay should be about $50. Told her to let phramacy know she needs to charge medication if she cannot afford full price. Pt expressed understanding and will continue to communicate with me through email or by phone. ? ?Can a RX be sent to [facility]? ?

## 2021-08-23 NOTE — Telephone Encounter (Signed)
Left message with pt explaining novo nordisk needs 2023 income for her & her spouse in order to continue with enrollment. I dont have this years on file. Also informed pt we have no samples of medication available at this time. ? ?Gave call back number [phone] ?

## 2021-08-23 NOTE — Telephone Encounter (Signed)
Noted and agree. 

## 2021-08-23 NOTE — Telephone Encounter (Signed)
[facility]  ? ?1 refill provided.  ?

## 2021-08-23 NOTE — Addendum Note (Signed)
Addended by: Leavy Cella on: 08/23/2021 10:09 AM ? ? Modules accepted: Orders ? ?

## 2021-08-28 ENCOUNTER — Other Ambulatory Visit: Payer: Self-pay

## 2021-08-28 ENCOUNTER — Other Ambulatory Visit (HOSPITAL_COMMUNITY): Payer: Self-pay

## 2021-08-28 ENCOUNTER — Ambulatory Visit (INDEPENDENT_AMBULATORY_CARE_PROVIDER_SITE_OTHER): Payer: Medicare Other | Admitting: Pharmacist

## 2021-08-28 ENCOUNTER — Encounter: Payer: Self-pay | Admitting: Pharmacist

## 2021-08-28 DIAGNOSIS — E1140 Type 2 diabetes mellitus with diabetic neuropathy, unspecified: Secondary | ICD-10-CM

## 2021-08-28 DIAGNOSIS — G6289 Other specified polyneuropathies: Secondary | ICD-10-CM

## 2021-08-28 MED ORDER — ONETOUCH VERIO W/DEVICE KIT: 1.0000 | PACK | Freq: Once | 0 refills | Status: AC

## 2021-08-28 MED ORDER — GABAPENTIN 100 MG PO CAPS: 100.0000 mg | ORAL_CAPSULE | Freq: Two times a day (BID) | ORAL | 3 refills | Status: DC

## 2021-08-28 MED ORDER — METFORMIN HCL ER 500 MG PO TB24: 1000.0000 mg | ORAL_TABLET | Freq: Every day | ORAL | 3 refills | Status: DC

## 2021-08-28 MED ORDER — SEMAGLUTIDE (1 MG/DOSE) 4 MG/3ML ~~LOC~~ SOPN
1.0000 mg | PEN_INJECTOR | SUBCUTANEOUS | 11 refills | Status: AC
Start: 2021-08-28 — End: 2021-09-25

## 2021-08-28 MED ORDER — ONETOUCH VERIO VI STRP: 1.0000 | ORAL_STRIP | 12 refills | Status: AC | PRN

## 2021-08-28 MED ORDER — GABAPENTIN 300 MG PO CAPS: 300.0000 mg | ORAL_CAPSULE | Freq: Every day | ORAL | 3 refills | Status: DC

## 2021-08-28 NOTE — Patient Instructions (Addendum)
Nice to see you today! ? ?We have changed your dose of gabapentin. You will now be taking gabapentin 100 mg once in the morning and once at lunchtime, then take 300 mg at bedtime. Your metformin was changed to extended release; take two 500 mg tablets daily. You can take these both in the morning or one in the morning and one in the evening. We have also re-enrolled you in the Ozempic patient assistance program and hope you have you restart Ozempic in the next few weeks. ?

## 2021-08-28 NOTE — Progress Notes (Signed)
Reviewed: I agree with Dr. Koval's documentation and management. 

## 2021-08-28 NOTE — Progress Notes (Signed)
? ? ?S:    ? ?Chief Complaint  ?Patient presents with  ? Medication Management  ?  Diabetes  ? ? ?[name] is a 66 y.o. female who presents for diabetes evaluation, education, and management. PMH is significant for diabetes, peripheral neuropathy. Patient was last seen by Primary Care Provider Dr. Larae Grooms on 07/17/2021.  ? ?Today, She arrives in good spirits and presents without assistance.  ? ?Current diabetes medications include: metformin 1000 mg. Pt was previously on Ozempic and had good response with initial use reporting weight loss of 9 lbs. Pt has not been taking for ~3 weeks due to need for re-enrollment in Patient Assistance Program.  ?Current hypertension medications include: amlodipine 10 mg, lisinopril-HCTZ 20/25 mg ?Current hyperlipidemia medications include: atorvastatin 40 mg ? ?Patient states that She is taking her medications as prescribed. Patient reports adherence with medications.  ? ?Patient reports that she has not been checking her BG at home due to inability to afford BG meter and reports her insurance would not cover it. Pt reports that she does not know what her BG numbers are. ? ?Patient reports neuropathy (nerve pain). ?Patient denies severe nocturia and polyuria.  ? ?Patient-reported exercise habits: Reports she has now begun walking in her neighborhood daily with assistance from 2 ski-type poles. ? ?O:  ?Physical Exam ?Constitutional:   ?   Appearance: Normal appearance.  ?Pulmonary:  ?   Effort: Pulmonary effort is normal.  ?Neurological:  ?   Mental Status: She is alert.  ?Psychiatric:     ?   Mood and Affect: Mood normal.     ?   Behavior: Behavior normal.     ?   Thought Content: Thought content normal.     ?   Judgment: Judgment normal.  ? ? ?Review of Systems  ?All other systems reviewed and are negative. ? ? ?Lab Results  ?Component Value Date  ? HGBA1C 8.4 (A) 05/29/2021  ? ?Vitals:  ? 08/28/21 0946  ?BP: (!) 158/66  ?Pulse: (!) 49  ?SpO2: 98%  ? ? ?Lipid Panel  ?    ?Component Value Date/Time  ? CHOL 185 11/16/2020 1216  ? CHOL 189 08/28/2016 0000  ? TRIG 84 11/16/2020 1216  ? TRIG 63 08/28/2016 0000  ? TRIG 184 (H) 04/09/2006 0904  ? HDL 58 11/16/2020 1216  ? HDL 75 08/28/2016 0000  ? CHOLHDL 3.2 11/16/2020 1216  ? CHOLHDL 2.52 08/28/2016 0000  ? CHOLHDL 4.5 06/19/2012 1125  ? VLDL 61 (H) 06/19/2012 1125  ? [place] 112 (H) 11/16/2020 1216  ? LDLDIRECT 113 (H) 07/05/2013 1512  ? ? ?A/P: ?Diabetes longstanding currently with unknown control as pt is currently without glucometer. Pt did not appear dehydrated or toxic and pt denies any symptoms of hyper or hypoglycemia. Patient is able to verbalize appropriate hypoglycemia management plan. Medication adherence appears optimal.  ?-Restarted GLP-1 Ozempic (generic name semaglutide) to 0.5 mg for 2 weeks and then increase to 1 mg following week once pt is re-enrolled in Patient Assistance Program.  ?- Adjusted  metformin from 1000 mg QD to metformin XR 500 mg BID or 2 tablets QD based on pt preference.  ?-Extensively discussed pathophysiology of diabetes, recommended lifestyle interventions, dietary effects on blood sugar control.  ?-Counseled on s/sx of and management of hypoglycemia.  ? ?Hypertension longstanding currently uncontrolled. Blood pressure goal of <130/80 mmHg. Medication adherence optimal. Blood pressure control is suboptimal likely due to stress. ? ?Pt reported continued neuropathy on current regimen of  gabapentin 100 mg QAM and 300 mg QHS. Added mid-day dose of gabapentin 100 mg in addition to current regimen.  ? ?Written patient instructions provided. Patient verbalized understanding of treatment plan. Total time in face to face counseling 48 minutes.   ? ?Follow up pharmacist in 4 weeks on April 3.  Patient seen with Earvin Hansen PharmD Candidate and Zenaida Deed, PharmD, PGY 1 pharmacy resident.  ?. ? ?

## 2021-09-12 NOTE — Progress Notes (Signed)
Received notification from [place] regarding approval for [name]. Patient assistance approved from 09/11/21 to 05/23/22. ? ?MEDICATION SHIPS TO OFFICE ? ?Phone: [phone] ? ?

## 2021-09-24 ENCOUNTER — Encounter: Payer: Self-pay | Admitting: Pharmacist

## 2021-09-24 ENCOUNTER — Ambulatory Visit (INDEPENDENT_AMBULATORY_CARE_PROVIDER_SITE_OTHER): Payer: Medicare Other | Admitting: Pharmacist

## 2021-09-24 DIAGNOSIS — E1142 Type 2 diabetes mellitus with diabetic polyneuropathy: Secondary | ICD-10-CM | POA: Diagnosis not present

## 2021-09-24 DIAGNOSIS — G6289 Other specified polyneuropathies: Secondary | ICD-10-CM | POA: Diagnosis not present

## 2021-09-24 MED ORDER — GABAPENTIN 300 MG PO CAPS: 300.0000 mg | ORAL_CAPSULE | Freq: Every day | ORAL | 3 refills | Status: DC

## 2021-09-24 MED ORDER — LISINOPRIL-HYDROCHLOROTHIAZIDE 20-25 MG PO TABS: 1.0000 | ORAL_TABLET | Freq: Every day | ORAL | 3 refills | Status: DC

## 2021-09-24 NOTE — Assessment & Plan Note (Signed)
Discussed control of neuropath.   ?Re-educated on previously discussed dosing of 147m BID in the morning and mid-day AND 3012mQHS.  ?Refill for 30050mrovided.  ?

## 2021-09-24 NOTE — Progress Notes (Signed)
? ? ?S:    ? ?Chief Complaint  ?Patient presents with  ? Medication Management  ?  DM f/u  ? ?[name] is a 66 y.o. female who presents for diabetes evaluation, education, and management. PMH is significant for diabetes, hypertension. Patient was referred and last seen by Dr. Valentina Lucks on 08/28/21. ? ?Today, patient arrives in good spirits and presents without assistance.  ? ?Patient has not yet started Ozempic (semaglutide) because it was approved by PAP on 09/11/21 and has not yet been delivered to our office for patient pick-up.  Anticipate delivery in the next week.  ?Patient reports she did not start taking the Gabapentin 100 mg at lunchtime and has only been taking in the morning as she was doing previously. ? ?Current diabetes medications include: metformin 500 mg XR 1 tablet BID ?Current hypertension medications include: amlodipine 10 mg daily, spironolactone 25 mg daily. Patient reports she has not been taking lisinopril-HCTZ. ?Current hyperlipidemia medications include: atorvastatin 40 mg daily ? ? ?O:  ?Physical Exam ?Constitutional:   ?   Appearance: Normal appearance.  ?Pulmonary:  ?   Effort: Pulmonary effort is normal.  ?Neurological:  ?   Mental Status: She is alert.  ?Psychiatric:     ?   Mood and Affect: Mood normal.     ?   Behavior: Behavior normal.     ?   Thought Content: Thought content normal.     ?   Judgment: Judgment normal.  ? ? ?Review of Systems  ?All other systems reviewed and are negative. ? ? ?Lab Results  ?Component Value Date  ? HGBA1C 8.4 (A) 05/29/2021  ? ?Vitals:  ? 09/24/21 0956  ?BP: (!) 156/98  ?Pulse: 66  ?SpO2: 97%  ? ? ?Lipid Panel  ?   ?Component Value Date/Time  ? CHOL 185 11/16/2020 1216  ? CHOL 189 08/28/2016 0000  ? TRIG 84 11/16/2020 1216  ? TRIG 63 08/28/2016 0000  ? TRIG 184 (H) 04/09/2006 0904  ? HDL 58 11/16/2020 1216  ? HDL 75 08/28/2016 0000  ? CHOLHDL 3.2 11/16/2020 1216  ? CHOLHDL 2.52 08/28/2016 0000  ? CHOLHDL 4.5 06/19/2012 1125  ? VLDL 61 (H) 06/19/2012  1125  ? [place] 112 (H) 11/16/2020 1216  ? LDLDIRECT 113 (H) 07/05/2013 1512  ? ? ?Clinical Atherosclerotic Cardiovascular Disease (ASCVD): No  ?The 10-year ASCVD risk score (Arnett DK, et al., 2019) is: 23.2% ?  Values used to calculate the score: ?    Age: 31 years ?    Sex: Female ?    Is Non-Hispanic African American: No ?    Diabetic: Yes ?    Tobacco smoker: No ?    Systolic Blood Pressure: 151 mmHg ?    Is BP treated: Yes ?    HDL Cholesterol: 58 mg/dL ?    Total Cholesterol: 185 mg/dL  ? ? ?A/P: ?Diabetes longstanding currently with unknown control. Patient is able to verbalize appropriate hypoglycemia management plan. Medication adherence appears sub-optimal. Control is suboptimal due to inability to initiate new medication until PAP sends to office. ?-Restart GLP-1 Ozempic (generic name semaglutide) to 0.5 mg for 2 weeks and then increase to 1 mg the following week once it comes into the office from PAP.  Approved and anticipate start date this week.  ?-Continued metformin XR 500 mg BID. ?-Patient educated on purpose, proper use, and potential adverse effects of GI upset.  ?-Extensively discussed pathophysiology of diabetes, recommended lifestyle interventions, dietary effects on blood  sugar control.  ?-Counseled on s/sx of and management of hypoglycemia.  ?-Next A1c anticipated at next visit 04/17.  ? ? ?Hypertension longstanding currently uncontrolled, most likely due to sub-optimal medication adherence.  Blood pressure control is suboptimal due to patient running out of lisinopril-HCTZ. Blood pressure goal of <130/80 mmHg.  ?-Reordered / restart lisinopril-HCTZ.   New prescription sent to patient's pharmacy ? ?Written patient instructions provided. Patient verbalized understanding of treatment plan. Total time in face to face counseling 32 minutes.   ? ?Follow up PCP clinic visit on 10/08/21. Patient seen with Earvin Hansen PharmD Candidate. ? ?

## 2021-09-24 NOTE — Assessment & Plan Note (Signed)
Diabetes longstanding currently with unknown control. Patient is able to verbalize appropriate hypoglycemia management plan. Medication adherence appears sub-optimal. Control is suboptimal due to inability to initiate new medication until PAP sends to office. ?-Restart GLP-1 Ozempic (generic name semaglutide) to 0.5 mg for 2 weeks and then increase to 1 mg the following week once it comes into the office from PAP.  Approved and anticipate start date this week.  ?-Continued metformin XR 500 mg BID. ?-Patient educated on purpose, proper use, and potential adverse effects of GI upset.  ?-Extensively discussed pathophysiology of diabetes, recommended lifestyle interventions, dietary effects on blood sugar control.  ? ?

## 2021-09-24 NOTE — Assessment & Plan Note (Signed)
>>  ASSESSMENT AND PLAN FOR TYPE 2 DM WITH DIABETIC NEUROPATHY AFFECTING BOTH SIDES OF BODY ([facility]) WRITTEN ON 09/24/2021 11:56 AM BY KOVAL, PETER G, RPH-CPP  Diabetes longstanding currently with unknown control. Patient is able to verbalize appropriate hypoglycemia management plan. Medication adherence appears sub-optimal. Control is suboptimal due to inability to initiate new medication until PAP sends to office. -Restart GLP-1 Ozempic  (generic name semaglutide ) to 0.5 mg for 2 weeks and then increase to 1 mg the following week once it comes into the office from PAP.  Approved and anticipate start date this week.  -Continued metformin  XR 500 mg BID. -Patient educated on purpose, proper use, and potential adverse effects of GI upset.  -Extensively discussed pathophysiology of diabetes, recommended lifestyle interventions, dietary effects on blood sugar control.

## 2021-09-24 NOTE — Patient Instructions (Addendum)
Nice to see you today! ? ?When your Ozempic (semaglutide) comes into the office, you will begin taking 1 mg subcutaneous weekly. ? ?You can take the gabapentin 100 mg twice daily in the morning and at lunchtime and the 300 mg at bedtime. ? ?We have reordered your lisinopril-hydrochlorothiazide to your pharmacy. Please make sure you begin taking this medication again for your blood pressure. ? ?You will follow up with Dr. Larae Grooms on 10/08/21 and you can discuss with her how things are going and if you need to follow up again with [place]. ?

## 2021-09-24 NOTE — Progress Notes (Signed)
Reviewed: I agree with Dr. Koval's documentation and management. 

## 2021-09-25 ENCOUNTER — Telehealth: Payer: Self-pay

## 2021-09-25 NOTE — Telephone Encounter (Signed)
INFORMED PT HER OZEMPIC IS READY FOR PICKUP.  ? ?MEDICATION IS LABELED & READY IN MED ROOM [facility]. ?

## 2021-09-27 ENCOUNTER — Other Ambulatory Visit: Payer: Self-pay | Admitting: Family Medicine

## 2021-09-27 DIAGNOSIS — Z1231 Encounter for screening mammogram for malignant neoplasm of breast: Secondary | ICD-10-CM

## 2021-09-27 NOTE — Telephone Encounter (Signed)
Pt picked up. [name], CMA ? ?

## 2021-10-08 ENCOUNTER — Other Ambulatory Visit: Payer: Self-pay

## 2021-10-08 ENCOUNTER — Encounter: Payer: Self-pay | Admitting: Family Medicine

## 2021-10-08 ENCOUNTER — Ambulatory Visit (INDEPENDENT_AMBULATORY_CARE_PROVIDER_SITE_OTHER): Payer: Medicare Other | Admitting: Family Medicine

## 2021-10-08 VITALS — BP 139/75 | HR 68 | Ht 67.0 in | Wt 235.0 lb

## 2021-10-08 DIAGNOSIS — M5441 Lumbago with sciatica, right side: Secondary | ICD-10-CM | POA: Diagnosis not present

## 2021-10-08 MED ORDER — ATORVASTATIN CALCIUM 40 MG PO TABS: 40.0000 mg | ORAL_TABLET | Freq: Every day | ORAL | 2 refills | Status: DC

## 2021-10-08 NOTE — Patient Instructions (Signed)
It was great seeing you today! ? ?Today we discussed your back pain, I have placed a referral to physical therapy. You should hear from them soon, if you do not hear from them within 1-2 weeks then please contact our office so that we can help with scheduling. I think you also have sciatic nerve pain which is contributing to some of your back pain, I have attached some stretching exercises. Please do these daily or even multiple times a day, if any of these causes pain then please do not do that exercise.  ? ?Please follow up at your next scheduled appointment in 2 months, if anything arises between now and then, please don't hesitate to contact our office. ? ? ?Thank you for allowing [place] to be a part of your medical care! ? ?Thank you, ?Dr. Larae Grooms  ?

## 2021-10-08 NOTE — Assessment & Plan Note (Signed)
-  likely multifactorial with chronic curvature changes and stenosis as noted from prior imaging on CT lumbar spine performed August 2022 demonstrated mild spinal stenosis with neural foraminal stenosis at L4-L5 with slight progression of moderate ot severe multifactorial spinal stenosis and mild to moderate left neural foraminal stenosis at L3-L4. Also likely a sciatic component. ?-reassuringly no red flag symptoms ?-stretching exercises handout provided ?-PT referral placed, it seems patient participating in this before and was successful  ?-follow up in 2-3 months, may consider referral to sports medicine, patient has followed up with ortho in the past so also may be a consideration ?

## 2021-10-08 NOTE — Progress Notes (Signed)
? ? ?  SUBJECTIVE:  ? ?CHIEF COMPLAINT / HPI:  ? ?Patient presents with worsening back pain. Back pain has been chronic and ongoing for the last 3 years. She has gotten injections for her pain before. She helps care for her husband who is wheelchair bound. They get some help at home but now she may need to be even more involved in caring for them. Daughter lives in [place] and helps with caring for her father but now patient will have to do more and she is worried that she will have significant difficulty with her chronic back pain. She says very active and also does a lot of house work. She mows the lawn and does yard work as well. Wears a brace which makes the pain better. Aggravating factors including bending over. Pain starts in her low and sometimes will radiate to the right leg. Endorses some numbness and tingling in her right leg. Denies taking any medications. Denies any history of cancer and groin paresthesia.  ? ?OBJECTIVE:  ? ?BP 139/75   Pulse 68   Ht 5' 7"  (1.702 m)   Wt 235 lb (106.6 kg)   SpO2 97%   BMI 36.81 kg/m?   ?General: Patient well-appearing, in no acute distress. ?CV: RRR, no murmurs or gallops auscultated ?Resp: CTAB, no wheezing, rales or rhonchi noted ?MSK: no gross deformity, tenderness noted along L3-L5 midline, positive straight leg more prominently along the right LE ?Neuro: normal gait, gross sensation intact ? ?ASSESSMENT/PLAN:  ? ?Low back pain ?-likely multifactorial with chronic curvature changes and stenosis as noted from prior imaging on CT lumbar spine performed August 2022 demonstrated mild spinal stenosis with neural foraminal stenosis at L4-L5 with slight progression of moderate ot severe multifactorial spinal stenosis and mild to moderate left neural foraminal stenosis at L3-L4. Also likely a sciatic component. ?-reassuringly no red flag symptoms ?-stretching exercises handout provided ?-PT referral placed, it seems patient participating in this before and was  successful  ?-follow up in 2-3 months, may consider referral to sports medicine, patient has followed up with ortho in the past so also may be a consideration ?  ? ? ? ?Donney Dice, DO ?[place]  ?

## 2021-10-09 ENCOUNTER — Encounter: Payer: Self-pay | Admitting: Physical Therapy

## 2021-10-09 ENCOUNTER — Ambulatory Visit: Payer: Medicare Other | Attending: Family Medicine | Admitting: Physical Therapy

## 2021-10-09 DIAGNOSIS — R2689 Other abnormalities of gait and mobility: Secondary | ICD-10-CM | POA: Insufficient documentation

## 2021-10-09 DIAGNOSIS — M5459 Other low back pain: Secondary | ICD-10-CM | POA: Insufficient documentation

## 2021-10-09 DIAGNOSIS — M5441 Lumbago with sciatica, right side: Secondary | ICD-10-CM | POA: Insufficient documentation

## 2021-10-09 DIAGNOSIS — M6281 Muscle weakness (generalized): Secondary | ICD-10-CM | POA: Insufficient documentation

## 2021-10-09 NOTE — Therapy (Signed)
?OUTPATIENT PHYSICAL THERAPY NEURO EVALUATION ? ? ?Patient Name: [name] ?MRN: [id number] ?DOB:03-Apr-1956, 66 y.o., female ?Today's Date: 10/10/2021 ? ?PCP: Donney Dice, DO ?REFERRING PROVIDER: Leeanne Rio, MD ? ? PT End of Session - 10/09/21 1114   ? ? Visit Number 1   ? Number of Visits 9   8 + eval  ? Date for PT Re-Evaluation 12/21/21   ? PT Start Time 1104   ? PT Stop Time 1148   ? PT Time Calculation (min) 44 min   ? Equipment Utilized During Treatment Gait belt   ? Activity Tolerance Patient tolerated treatment well   ? Behavior During Therapy [facility] for tasks assessed/performed   ? ?  ?  ? ?  ? ? ?Past Medical History:  ?Diagnosis Date  ? Advanced directives, counseling/discussion 04/12/2020  ? Allergy   ? Anemia   ? ANEMIA, PERNICIOUS, HX OF 05/13/2007  ? Anxiety   ? Arthritis   ? ASYMPTOMATIC POSTMENOPAUSAL STATUS 02/18/2008  ? B12 deficiency 12/13/2016  ? BACK PAIN, LUMBAR 11/19/2007  ? Blood in urine 07/19/2019  ? Bowel incontinence   ? Cataract   ? Cellulitis of left lower leg 10/28/2013  ? Cellulitis of leg, right 10/11/2010  ? Chronic depression 10/06/2007  ? Chronic dermatitis of hands 05/02/2009  ? Coronary artery disease involving native coronary artery of native heart without angina pectoris 07/07/2018  ? Last Assessment & Plan:  Formatting of this note might be different from the original. The patient reports remote cardiac catheterization with up to 40% plaque.  Subsequent Cardiolite stress test in October 2019 did not show any evidence of active ischemia or prior infarction. Formatting of this note might be different from the original. medical  Last Assessment & Plan:  Formatting of this note mi  ? DEPRESSION, CHRONIC 10/06/2007  ? Diabetes mellitus without complication ([place])   ? DIABETES MELLITUS, WITH NEUROLOGICAL COMPLICATIONS 11/19/4130  ? Diabetic neuropathy ([place])   ? DYSLIPIDEMIA 05/13/2007  ? Dyslipidemia 04/15/2017  ? Last Assessment & Plan:  The patient will continue atorvastatin for  her dyslipidemia.  ? Dyslipidemia associated with type 2 diabetes mellitus ([place]) 07/11/2018  ? Essential hypertension 03/20/2010  ? Family history of colon cancer   ? Fecal incontinence 08/02/2015  ? Frozen shoulder   ? Lt  ? Full dentures   ? Gastroesophageal reflux disease without esophagitis 04/15/2017  ? Generalized abdominal pain 02/05/2018  ? Genetic testing 11/24/2017  ? Negative genetic testing on the common hereditary cancer panel.  The Hereditary Gene Panel offered by [facility] includes sequencing and/or deletion duplication testing of the following 47 genes: APC, ATM, AXIN2, BARD1, BMPR1A, BRCA1, BRCA2, BRIP1, CDH1, CDK4, CDKN2A (p14ARF), CDKN2A (p16INK4a), CHEK2, CTNNA1, DICER1, EPCAM (Deletion/duplication testing only), GREM1 (promoter region deletion/duplicat  ? GERD (gastroesophageal reflux disease)   ? GI bleed 03/15/2016  ? GOITER, MULTINODULAR 05/13/2007  ? GOITER, MULTINODULAR 05/13/2007  ?  (last TSH 3.26; US soft tissue neck in 09/2004 showed multinodular goiter with specific small nodule for which was recommended to be followed by repeat US in 3-6 months 12/2016: Thyroid US: recommend repeat in 1 year (one nodule 1.7cm on left thyroid)   ? Headache   ? History of blood transfusion   ? Hx of colonic polyps 12/04/2010  ? HYPERCHOLESTEROLEMIA 03/20/2010  ? Hyperparathyroidism ([name]) 04/11/2020  ? HYPERTENSION 03/20/2010  ? Hypertension associated with diabetes ([place]) 07/11/2018  ? Hypothyroidism 07/19/2019  ? Iron deficiency anemia 06/19/2012  ? Low back pain 03/05/2012  ?  Completed PT-notes say very motivated and had good progress.   CT (03/2020: Mild curvature convex to the right in the upper lumbar region into left in lower lumbar region. No antero or retrolisthesis in the supine position. retrolisthesis at L3-4 of 3 mm with flexion which increases to 6 mm with neutral and extension. Multifactorial spinal stenosis at L3-4 due to circumferential protrusion of the   ? Lower back pain   ? Major depression in  remission ([place]) 07/07/2018  ? Last Assessment & Plan:  Mood has seemed good on sertraline.  ? Malabsorption 03/12/2016  ? NASH (nonalcoholic steatohepatitis)   ? Nausea 07/19/2019  ? Need for immunization against influenza 04/12/2020  ? No-show for appointment 03/08/2020  ? Nonobstructive CAD  08/26/2013  ? The patient has prior cardiac workup in 2006 when she underwent a stress test for abnormal EKG. The stress test was nonconclusive and she underwent cardiac catheterization that showed 30% stenosis in the LAD in 25% stenosis in RCA she was treated medically.    ? OA (osteoarthritis)   ? OBSTRUCTIVE SLEEP APNEA 06/12/2007  ? uses CPAP.  ? Pacemaker   ? Paroxysmal atrial fibrillation ([place]) 05/11/2018  ? Last Assessment & Plan:  Patient was noted to have episodes of paroxysmal atrial fibrillation that was predominantly rate controlled and at a relatively low burden.  She is chronically on anticoagulation now with Eliquis.  She does not take any negative chronotropic agents with her sinus bradycardia.  If she has any breakthrough more prolonged tachycardic episodes and requires negative chronotropi  ? Peripheral autonomic neuropathy due to diabetes mellitus ([place]) 07/11/2018  ? PERIPHERAL NEUROPATHY 01/12/2009  ? Peripheral neuropathy 01/12/2009  ? Peripheral vascular disease ([place])   ? Presbycusis of both ears 04/11/2020  ? Primary osteoarthritis involving multiple joints 07/07/2018  ? Last Assessment & Plan:  Post op left tka and here for rehab as her husband is handicapped so she has limited help at home. Consitpation noted, some pain overnight, better with a pillow under her knee.  ? Primary osteoarthritis of left knee 04/23/2011  ? Previously seen by Dr. Alphonzo Severance. Will plan on repeat referral after trial injection.     ? PSORIASIS 05/28/2010  ? Rectal prolapse 05/24/2015  ? Rectovaginal fistula 05/25/2014  ? Renal insufficiency   ? stage 1 kd  ? S/P bariatric surgery-duodenal switch with sleeve gastrectomy 12/04/2013  ? S/P  total knee arthroplasty, right 11/16/2019  ? Screening mammogram, encounter for 07/12/2019  ? Second degree AV block 02/08/2020  ? Serum calcium elevated 04/12/2020  ? Short bowel syndrome 07/11/2018  ? Spigelian hernia 02/05/2018  ? Status post left knee replacement 07/11/2018  ? Stress 05/24/2015  ? Surgical counseling visit 10/02/2019  ? Tubular adenoma of colon 03/09/2016  ? Colonoscopy in 10/2015: 3  tubular adenomas; GI recommends repeat colonoscopy in 3 years Colonoscopy August 2012: 3 tubular adenomas.   ? Type 2 diabetes, controlled, with neuropathy ([place]) 02/18/2008  ? Umbilical hernia without obstruction and without gangrene 04/15/2017  ? Unilateral primary osteoarthritis, left knee   ? UNSPECIFIED VENOUS INSUFFICIENCY 05/28/2010  ? Patient with frequent ulceration related to venous stasis-seen at wound care. Edema and Venous stasis changes due to this . Echo 04/16/11 with EF 60%. No signs diastolic dysfunction.       ? Vaccination against Streptococcus pneumoniae within past 5 years 04/12/2020  ? Vaginal irritation 07/19/2019  ? Venous stasis dermatitis of right lower extremity 01/02/2018  ? Vitamin D deficiency 03/09/2016  ?  Wears glasses   ? ?Past Surgical History:  ?Procedure Laterality Date  ? CATARACT EXTRACTION W/ INTRAOCULAR LENS  IMPLANT, BILATERAL    ? COLONOSCOPY    ? ELECTROCARDIOGRAM  04/16/2006  ? EXAMINATION UNDER ANESTHESIA N/A 06/29/2014  ? Procedure: EXAM UNDER ANESTHESIA;  Surgeon: Janyth Contes, MD;  Location: [facility];  Service: Gynecology;  Laterality: N/A;  ? Exercise myoview  01/24/2005  ? FLEXIBLE SIGMOIDOSCOPY    ? GASTRIC BYPASS  11/09/2013  ? GASTRIC BYPASS    ? JOINT REPLACEMENT    ? KNEE ARTHROSCOPY  2003  ? right  ? LAPAROSCOPY N/A 03/12/2016  ? Procedure: LAPAROSCOPIC ANASTOMOSIS OF INTESTINE (ENTEROENTEROSTOMY);  Surgeon: Ladora Daniel, MD;  Location: [facility];  Service: General;  Laterality: N/A;  ? LEG SURGERY Left   ? metal and pins in lower left leg  ? mrsa Right   ? arm  ?  MULTIPLE TOOTH EXTRACTIONS    ? PACEMAKER LEADLESS INSERTION N/A 02/08/2020  ? Procedure: PACEMAKER LEADLESS INSERTION;  Surgeon: Thompson Grayer, MD;  Location: [facility];  Service: Cardiovascular;  Laterality:

## 2021-10-11 ENCOUNTER — Ambulatory Visit
Admission: RE | Admit: 2021-10-11 | Discharge: 2021-10-11 | Disposition: A | Discharge status: Home / Self Care | Payer: Medicare Other | Source: Ambulatory Visit | Attending: Pharmacist | Admitting: Pharmacist

## 2021-10-11 DIAGNOSIS — Z1231 Encounter for screening mammogram for malignant neoplasm of breast: Secondary | ICD-10-CM | POA: Diagnosis not present

## 2021-10-16 ENCOUNTER — Encounter: Payer: Self-pay | Admitting: Rehabilitation

## 2021-10-16 ENCOUNTER — Ambulatory Visit: Payer: Medicare Other | Admitting: Rehabilitation

## 2021-10-16 DIAGNOSIS — M6281 Muscle weakness (generalized): Secondary | ICD-10-CM

## 2021-10-16 DIAGNOSIS — M5441 Lumbago with sciatica, right side: Secondary | ICD-10-CM | POA: Diagnosis not present

## 2021-10-16 DIAGNOSIS — R2689 Other abnormalities of gait and mobility: Secondary | ICD-10-CM

## 2021-10-16 DIAGNOSIS — M5459 Other low back pain: Secondary | ICD-10-CM | POA: Diagnosis not present

## 2021-10-16 NOTE — Therapy (Signed)
?OUTPATIENT PHYSICAL THERAPY TREATMENT NOTE ? ? ?Patient Name: [name] ?MRN: [id number] ?DOB:[birth year], 66 y.o., female ?Today's Date: 10/16/2021 ? ?PCP: Donney Dice, DO ?REFERRING PROVIDER: Donney Dice, DO ? ?END OF SESSION:  ? PT End of Session - 10/16/21 1022   ? ? Visit Number 2   ? Number of Visits 9   8 + eval  ? Date for PT Re-Evaluation 12/21/21   ? PT Start Time 1018   ? PT Stop Time 1100   ? PT Time Calculation (min) 42 min   ? Equipment Utilized During Treatment Gait belt   ? Activity Tolerance Patient tolerated treatment well   ? Behavior During Therapy [facility] for tasks assessed/performed   ? ?  ?  ? ?  ? ? ?Past Medical History:  ?Diagnosis Date  ? Advanced directives, counseling/discussion 04/12/2020  ? Allergy   ? Anemia   ? ANEMIA, PERNICIOUS, HX OF 05/13/2007  ? Anxiety   ? Arthritis   ? ASYMPTOMATIC POSTMENOPAUSAL STATUS 02/18/2008  ? B12 deficiency 12/13/2016  ? BACK PAIN, LUMBAR 11/19/2007  ? Blood in urine 07/19/2019  ? Bowel incontinence   ? Cataract   ? Cellulitis of left lower leg 10/28/2013  ? Cellulitis of leg, right 10/11/2010  ? Chronic depression 10/06/2007  ? Chronic dermatitis of hands 05/02/2009  ? Coronary artery disease involving native coronary artery of native heart without angina pectoris 07/07/2018  ? Last Assessment & Plan:  Formatting of this note might be different from the original. The patient reports remote cardiac catheterization with up to 40% plaque.  Subsequent Cardiolite stress test in October 2019 did not show any evidence of active ischemia or prior infarction. Formatting of this note might be different from the original. medical  Last Assessment & Plan:  Formatting of this note mi  ? DEPRESSION, CHRONIC 10/06/2007  ? Diabetes mellitus without complication ([place])   ? DIABETES MELLITUS, WITH NEUROLOGICAL COMPLICATIONS 12/11/5091  ? Diabetic neuropathy ([place])   ? DYSLIPIDEMIA 05/13/2007  ? Dyslipidemia 04/15/2017  ? Last Assessment & Plan:  The patient will continue  atorvastatin for her dyslipidemia.  ? Dyslipidemia associated with type 2 diabetes mellitus ([place]) 07/11/2018  ? Essential hypertension 03/20/2010  ? Family history of colon cancer   ? Fecal incontinence 08/02/2015  ? Frozen shoulder   ? Lt  ? Full dentures   ? Gastroesophageal reflux disease without esophagitis 04/15/2017  ? Generalized abdominal pain 02/05/2018  ? Genetic testing 11/24/2017  ? Negative genetic testing on the common hereditary cancer panel.  The Hereditary Gene Panel offered by [facility] includes sequencing and/or deletion duplication testing of the following 47 genes: APC, ATM, AXIN2, BARD1, BMPR1A, BRCA1, BRCA2, BRIP1, CDH1, CDK4, CDKN2A (p14ARF), CDKN2A (p16INK4a), CHEK2, CTNNA1, DICER1, EPCAM (Deletion/duplication testing only), GREM1 (promoter region deletion/duplicat  ? GERD (gastroesophageal reflux disease)   ? GI bleed 03/15/2016  ? GOITER, MULTINODULAR 05/13/2007  ? GOITER, MULTINODULAR 05/13/2007  ?  (last TSH 3.26; US soft tissue neck in 09/2004 showed multinodular goiter with specific small nodule for which was recommended to be followed by repeat US in 3-6 months 12/2016: Thyroid US: recommend repeat in 1 year (one nodule 1.7cm on left thyroid)   ? Headache   ? History of blood transfusion   ? Hx of colonic polyps 12/04/2010  ? HYPERCHOLESTEROLEMIA 03/20/2010  ? Hyperparathyroidism ([place]) 04/11/2020  ? HYPERTENSION 03/20/2010  ? Hypertension associated with diabetes ([place]) 07/11/2018  ? Hypothyroidism 07/19/2019  ? Iron deficiency anemia 06/19/2012  ? Low  back pain 03/05/2012  ? Completed PT-notes say very motivated and had good progress.   CT (03/2020: Mild curvature convex to the right in the upper lumbar region into left in lower lumbar region. No antero or retrolisthesis in the supine position. retrolisthesis at L3-4 of 3 mm with flexion which increases to 6 mm with neutral and extension. Multifactorial spinal stenosis at L3-4 due to circumferential protrusion of the   ? Lower back pain   ? Major  depression in remission ([place]) 07/07/2018  ? Last Assessment & Plan:  Mood has seemed good on sertraline.  ? Malabsorption 03/12/2016  ? NASH (nonalcoholic steatohepatitis)   ? Nausea 07/19/2019  ? Need for immunization against influenza 04/12/2020  ? No-show for appointment 03/08/2020  ? Nonobstructive CAD  08/26/2013  ? The patient has prior cardiac workup in 2006 when she underwent a stress test for abnormal EKG. The stress test was nonconclusive and she underwent cardiac catheterization that showed 30% stenosis in the LAD in 25% stenosis in RCA she was treated medically.    ? OA (osteoarthritis)   ? OBSTRUCTIVE SLEEP APNEA 06/12/2007  ? uses CPAP.  ? Pacemaker   ? Paroxysmal atrial fibrillation ([place]) 05/11/2018  ? Last Assessment & Plan:  Patient was noted to have episodes of paroxysmal atrial fibrillation that was predominantly rate controlled and at a relatively low burden.  She is chronically on anticoagulation now with Eliquis.  She does not take any negative chronotropic agents with her sinus bradycardia.  If she has any breakthrough more prolonged tachycardic episodes and requires negative chronotropi  ? Peripheral autonomic neuropathy due to diabetes mellitus ([place]) 07/11/2018  ? PERIPHERAL NEUROPATHY 01/12/2009  ? Peripheral neuropathy 01/12/2009  ? Peripheral vascular disease ([place])   ? Presbycusis of both ears 04/11/2020  ? Primary osteoarthritis involving multiple joints 07/07/2018  ? Last Assessment & Plan:  Post op left tka and here for rehab as her husband is handicapped so she has limited help at home. Consitpation noted, some pain overnight, better with a pillow under her knee.  ? Primary osteoarthritis of left knee 04/23/2011  ? Previously seen by Dr. Alphonzo Severance. Will plan on repeat referral after trial injection.     ? PSORIASIS 05/28/2010  ? Rectal prolapse 05/24/2015  ? Rectovaginal fistula 05/25/2014  ? Renal insufficiency   ? stage 1 kd  ? S/P bariatric surgery-duodenal switch with sleeve gastrectomy  12/04/2013  ? S/P total knee arthroplasty, right 11/16/2019  ? Screening mammogram, encounter for 07/12/2019  ? Second degree AV block 02/08/2020  ? Serum calcium elevated 04/12/2020  ? Short bowel syndrome 07/11/2018  ? Spigelian hernia 02/05/2018  ? Status post left knee replacement 07/11/2018  ? Stress 05/24/2015  ? Surgical counseling visit 10/02/2019  ? Tubular adenoma of colon 03/09/2016  ? Colonoscopy in 10/2015: 3  tubular adenomas; GI recommends repeat colonoscopy in 3 years Colonoscopy August 2012: 3 tubular adenomas.   ? Type 2 diabetes, controlled, with neuropathy ([place]) 02/18/2008  ? Umbilical hernia without obstruction and without gangrene 04/15/2017  ? Unilateral primary osteoarthritis, left knee   ? UNSPECIFIED VENOUS INSUFFICIENCY 05/28/2010  ? Patient with frequent ulceration related to venous stasis-seen at wound care. Edema and Venous stasis changes due to this . Echo 04/16/11 with EF 60%. No signs diastolic dysfunction.       ? Vaccination against Streptococcus pneumoniae within past 5 years 04/12/2020  ? Vaginal irritation 07/19/2019  ? Venous stasis dermatitis of right lower extremity 01/02/2018  ?  Vitamin D deficiency 03/09/2016  ? Wears glasses   ? ?Past Surgical History:  ?Procedure Laterality Date  ? CATARACT EXTRACTION W/ INTRAOCULAR LENS  IMPLANT, BILATERAL    ? COLONOSCOPY    ? ELECTROCARDIOGRAM  04/16/2006  ? EXAMINATION UNDER ANESTHESIA N/A 06/29/2014  ? Procedure: EXAM UNDER ANESTHESIA;  Surgeon: Janyth Contes, MD;  Location: [facility];  Service: Gynecology;  Laterality: N/A;  ? Exercise myoview  01/24/2005  ? FLEXIBLE SIGMOIDOSCOPY    ? GASTRIC BYPASS  11/09/2013  ? GASTRIC BYPASS    ? JOINT REPLACEMENT    ? KNEE ARTHROSCOPY  2003  ? right  ? LAPAROSCOPY N/A 03/12/2016  ? Procedure: LAPAROSCOPIC ANASTOMOSIS OF INTESTINE (ENTEROENTEROSTOMY);  Surgeon: Ladora Daniel, MD;  Location: [facility];  Service: General;  Laterality: N/A;  ? LEG SURGERY Left   ? metal and pins in lower left leg  ? mrsa Right    ? arm  ? MULTIPLE TOOTH EXTRACTIONS    ? PACEMAKER LEADLESS INSERTION N/A 02/08/2020  ? Procedure: PACEMAKER LEADLESS INSERTION;  Surgeon: Thompson Grayer, MD;  Location: [facility];  Service: Cardiovascular;  Lat

## 2021-10-22 ENCOUNTER — Ambulatory Visit: Payer: Medicare Other | Attending: Family Medicine | Admitting: Physical Therapy

## 2021-10-22 DIAGNOSIS — M5459 Other low back pain: Secondary | ICD-10-CM | POA: Insufficient documentation

## 2021-10-22 DIAGNOSIS — M6281 Muscle weakness (generalized): Secondary | ICD-10-CM | POA: Insufficient documentation

## 2021-10-22 DIAGNOSIS — R2689 Other abnormalities of gait and mobility: Secondary | ICD-10-CM | POA: Insufficient documentation

## 2021-10-22 NOTE — Therapy (Addendum)
?OUTPATIENT PHYSICAL THERAPY TREATMENT NOTE ? ? ?Patient Name: [name] ?MRN: [id number] ?DOB:05-19-1956, 66 y.o., female ?Today's Date: 10/22/2021 ? ?PCP: Donney Dice, DO ?REFERRING PROVIDER: Donney Dice, DO ? ?END OF SESSION:  ? PT End of Session - 10/22/21 1150   ? ? Visit Number 3   ? Number of Visits 9   8 + eval  ? Date for PT Re-Evaluation 12/21/21   ? PT Start Time 1145   ? PT Stop Time 2426   ? PT Time Calculation (min) 60 min   ? Equipment Utilized During Treatment Gait belt   ? Activity Tolerance Patient tolerated treatment well   ? Behavior During Therapy [facility] for tasks assessed/performed   ? ?  ?  ? ?  ? ? ?Past Medical History:  ?Diagnosis Date  ? Advanced directives, counseling/discussion 04/12/2020  ? Allergy   ? Anemia   ? ANEMIA, PERNICIOUS, HX OF 05/13/2007  ? Anxiety   ? Arthritis   ? ASYMPTOMATIC POSTMENOPAUSAL STATUS 02/18/2008  ? B12 deficiency 12/13/2016  ? BACK PAIN, LUMBAR 11/19/2007  ? Blood in urine 07/19/2019  ? Bowel incontinence   ? Cataract   ? Cellulitis of left lower leg 10/28/2013  ? Cellulitis of leg, right 10/11/2010  ? Chronic depression 10/06/2007  ? Chronic dermatitis of hands 05/02/2009  ? Coronary artery disease involving native coronary artery of native heart without angina pectoris 07/07/2018  ? Last Assessment & Plan:  Formatting of this note might be different from the original. The patient reports remote cardiac catheterization with up to 40% plaque.  Subsequent Cardiolite stress test in October 2019 did not show any evidence of active ischemia or prior infarction. Formatting of this note might be different from the original. medical  Last Assessment & Plan:  Formatting of this note mi  ? DEPRESSION, CHRONIC 10/06/2007  ? Diabetes mellitus without complication ([place])   ? DIABETES MELLITUS, WITH NEUROLOGICAL COMPLICATIONS 8/34/1962  ? Diabetic neuropathy ([place])   ? DYSLIPIDEMIA 05/13/2007  ? Dyslipidemia 04/15/2017  ? Last Assessment & Plan:  The patient will continue atorvastatin  for her dyslipidemia.  ? Dyslipidemia associated with type 2 diabetes mellitus ([place]) 07/11/2018  ? Essential hypertension 03/20/2010  ? Family history of colon cancer   ? Fecal incontinence 08/02/2015  ? Frozen shoulder   ? Lt  ? Full dentures   ? Gastroesophageal reflux disease without esophagitis 04/15/2017  ? Generalized abdominal pain 02/05/2018  ? Genetic testing 11/24/2017  ? Negative genetic testing on the common hereditary cancer panel.  The Hereditary Gene Panel offered by [facility] includes sequencing and/or deletion duplication testing of the following 47 genes: APC, ATM, AXIN2, BARD1, BMPR1A, BRCA1, BRCA2, BRIP1, CDH1, CDK4, CDKN2A (p14ARF), CDKN2A (p16INK4a), CHEK2, CTNNA1, DICER1, EPCAM (Deletion/duplication testing only), GREM1 (promoter region deletion/duplicat  ? GERD (gastroesophageal reflux disease)   ? GI bleed 03/15/2016  ? GOITER, MULTINODULAR 05/13/2007  ? GOITER, MULTINODULAR 05/13/2007  ?  (last TSH 3.26; US soft tissue neck in 09/2004 showed multinodular goiter with specific small nodule for which was recommended to be followed by repeat US in 3-6 months 12/2016: Thyroid US: recommend repeat in 1 year (one nodule 1.7cm on left thyroid)   ? Headache   ? History of blood transfusion   ? Hx of colonic polyps 12/04/2010  ? HYPERCHOLESTEROLEMIA 03/20/2010  ? Hyperparathyroidism ([place]) 04/11/2020  ? HYPERTENSION 03/20/2010  ? Hypertension associated with diabetes ([place]) 07/11/2018  ? Hypothyroidism 07/19/2019  ? Iron deficiency anemia 06/19/2012  ? Low  back pain 03/05/2012  ? Completed PT-notes say very motivated and had good progress.   CT (03/2020: Mild curvature convex to the right in the upper lumbar region into left in lower lumbar region. No antero or retrolisthesis in the supine position. retrolisthesis at L3-4 of 3 mm with flexion which increases to 6 mm with neutral and extension. Multifactorial spinal stenosis at L3-4 due to circumferential protrusion of the   ? Lower back pain   ? Major depression in  remission ([place]) 07/07/2018  ? Last Assessment & Plan:  Mood has seemed good on sertraline.  ? Malabsorption 03/12/2016  ? NASH (nonalcoholic steatohepatitis)   ? Nausea 07/19/2019  ? Need for immunization against influenza 04/12/2020  ? No-show for appointment 03/08/2020  ? Nonobstructive CAD  08/26/2013  ? The patient has prior cardiac workup in 2006 when she underwent a stress test for abnormal EKG. The stress test was nonconclusive and she underwent cardiac catheterization that showed 30% stenosis in the LAD in 25% stenosis in RCA she was treated medically.    ? OA (osteoarthritis)   ? OBSTRUCTIVE SLEEP APNEA 06/12/2007  ? uses CPAP.  ? Pacemaker   ? Paroxysmal atrial fibrillation ([place]) 05/11/2018  ? Last Assessment & Plan:  Patient was noted to have episodes of paroxysmal atrial fibrillation that was predominantly rate controlled and at a relatively low burden.  She is chronically on anticoagulation now with Eliquis.  She does not take any negative chronotropic agents with her sinus bradycardia.  If she has any breakthrough more prolonged tachycardic episodes and requires negative chronotropi  ? Peripheral autonomic neuropathy due to diabetes mellitus ([place]) 07/11/2018  ? PERIPHERAL NEUROPATHY 01/12/2009  ? Peripheral neuropathy 01/12/2009  ? Peripheral vascular disease ([place])   ? Presbycusis of both ears 04/11/2020  ? Primary osteoarthritis involving multiple joints 07/07/2018  ? Last Assessment & Plan:  Post op left tka and here for rehab as her husband is handicapped so she has limited help at home. Consitpation noted, some pain overnight, better with a pillow under her knee.  ? Primary osteoarthritis of left knee 04/23/2011  ? Previously seen by Dr. Alphonzo Severance. Will plan on repeat referral after trial injection.     ? PSORIASIS 05/28/2010  ? Rectal prolapse 05/24/2015  ? Rectovaginal fistula 05/25/2014  ? Renal insufficiency   ? stage 1 kd  ? S/P bariatric surgery-duodenal switch with sleeve gastrectomy 12/04/2013  ? S/P  total knee arthroplasty, right 11/16/2019  ? Screening mammogram, encounter for 07/12/2019  ? Second degree AV block 02/08/2020  ? Serum calcium elevated 04/12/2020  ? Short bowel syndrome 07/11/2018  ? Spigelian hernia 02/05/2018  ? Status post left knee replacement 07/11/2018  ? Stress 05/24/2015  ? Surgical counseling visit 10/02/2019  ? Tubular adenoma of colon 03/09/2016  ? Colonoscopy in 10/2015: 3  tubular adenomas; GI recommends repeat colonoscopy in 3 years Colonoscopy August 2012: 3 tubular adenomas.   ? Type 2 diabetes, controlled, with neuropathy ([place]) 02/18/2008  ? Umbilical hernia without obstruction and without gangrene 04/15/2017  ? Unilateral primary osteoarthritis, left knee   ? UNSPECIFIED VENOUS INSUFFICIENCY 05/28/2010  ? Patient with frequent ulceration related to venous stasis-seen at wound care. Edema and Venous stasis changes due to this . Echo 04/16/11 with EF 60%. No signs diastolic dysfunction.       ? Vaccination against Streptococcus pneumoniae within past 5 years 04/12/2020  ? Vaginal irritation 07/19/2019  ? Venous stasis dermatitis of right lower extremity 01/02/2018  ?  Vitamin D deficiency 03/09/2016  ? Wears glasses   ? ?Past Surgical History:  ?Procedure Laterality Date  ? CATARACT EXTRACTION W/ INTRAOCULAR LENS  IMPLANT, BILATERAL    ? COLONOSCOPY    ? ELECTROCARDIOGRAM  04/16/2006  ? EXAMINATION UNDER ANESTHESIA N/A 06/29/2014  ? Procedure: EXAM UNDER ANESTHESIA;  Surgeon: Janyth Contes, MD;  Location: [facility];  Service: Gynecology;  Laterality: N/A;  ? Exercise myoview  01/24/2005  ? FLEXIBLE SIGMOIDOSCOPY    ? GASTRIC BYPASS  11/09/2013  ? GASTRIC BYPASS    ? JOINT REPLACEMENT    ? KNEE ARTHROSCOPY  2003  ? right  ? LAPAROSCOPY N/A 03/12/2016  ? Procedure: LAPAROSCOPIC ANASTOMOSIS OF INTESTINE (ENTEROENTEROSTOMY);  Surgeon: Ladora Daniel, MD;  Location: [facility];  Service: General;  Laterality: N/A;  ? LEG SURGERY Left   ? metal and pins in lower left leg  ? mrsa Right   ? arm  ?  MULTIPLE TOOTH EXTRACTIONS    ? PACEMAKER LEADLESS INSERTION N/A 02/08/2020  ? Procedure: PACEMAKER LEADLESS INSERTION;  Surgeon: Thompson Grayer, MD;  Location: [facility];  Service: Cardiovascular;  Late

## 2021-10-30 ENCOUNTER — Ambulatory Visit: Payer: Medicare Other | Admitting: Physical Therapy

## 2021-10-30 ENCOUNTER — Encounter: Payer: Self-pay | Admitting: Physical Therapy

## 2021-10-30 DIAGNOSIS — R2689 Other abnormalities of gait and mobility: Secondary | ICD-10-CM

## 2021-10-30 DIAGNOSIS — M5459 Other low back pain: Secondary | ICD-10-CM | POA: Diagnosis not present

## 2021-10-30 DIAGNOSIS — M6281 Muscle weakness (generalized): Secondary | ICD-10-CM

## 2021-10-30 NOTE — Therapy (Signed)
?OUTPATIENT PHYSICAL THERAPY TREATMENT NOTE ? ? ?Patient Name: [name] ?MRN: [id number] ?DOB:September 27, 1955, 66 y.o., female ?Today's Date: 10/30/2021 ? ?PCP: Donney Dice, DO ?REFERRING PROVIDER: Leeanne Rio, MD ? ?END OF SESSION:  ? PT End of Session - 10/30/21 1035   ? ? Visit Number 4   ? Number of Visits 9   8 + eval  ? Date for PT Re-Evaluation 12/21/21   ? PT Start Time 1030   ? PT Stop Time 1100   ? PT Time Calculation (min) 30 min   ? Equipment Utilized During Treatment Gait belt   ? Activity Tolerance Patient tolerated treatment well   ? Behavior During Therapy [facility] for tasks assessed/performed   ? ?  ?  ? ?  ? ? ?Past Medical History:  ?Diagnosis Date  ? Advanced directives, counseling/discussion 04/12/2020  ? Allergy   ? Anemia   ? ANEMIA, PERNICIOUS, HX OF 05/13/2007  ? Anxiety   ? Arthritis   ? ASYMPTOMATIC POSTMENOPAUSAL STATUS 02/18/2008  ? B12 deficiency 12/13/2016  ? BACK PAIN, LUMBAR 11/19/2007  ? Blood in urine 07/19/2019  ? Bowel incontinence   ? Cataract   ? Cellulitis of left lower leg 10/28/2013  ? Cellulitis of leg, right 10/11/2010  ? Chronic depression 10/06/2007  ? Chronic dermatitis of hands 05/02/2009  ? Coronary artery disease involving native coronary artery of native heart without angina pectoris 07/07/2018  ? Last Assessment & Plan:  Formatting of this note might be different from the original. The patient reports remote cardiac catheterization with up to 40% plaque.  Subsequent Cardiolite stress test in October 2019 did not show any evidence of active ischemia or prior infarction. Formatting of this note might be different from the original. medical  Last Assessment & Plan:  Formatting of this note mi  ? DEPRESSION, CHRONIC 10/06/2007  ? Diabetes mellitus without complication ([place])   ? DIABETES MELLITUS, WITH NEUROLOGICAL COMPLICATIONS 02/24/4096  ? Diabetic neuropathy ([place])   ? DYSLIPIDEMIA 05/13/2007  ? Dyslipidemia 04/15/2017  ? Last Assessment & Plan:  The patient will continue  atorvastatin for her dyslipidemia.  ? Dyslipidemia associated with type 2 diabetes mellitus ([place]) 07/11/2018  ? Essential hypertension 03/20/2010  ? Family history of colon cancer   ? Fecal incontinence 08/02/2015  ? Frozen shoulder   ? Lt  ? Full dentures   ? Gastroesophageal reflux disease without esophagitis 04/15/2017  ? Generalized abdominal pain 02/05/2018  ? Genetic testing 11/24/2017  ? Negative genetic testing on the common hereditary cancer panel.  The Hereditary Gene Panel offered by [facility] includes sequencing and/or deletion duplication testing of the following 47 genes: APC, ATM, AXIN2, BARD1, BMPR1A, BRCA1, BRCA2, BRIP1, CDH1, CDK4, CDKN2A (p14ARF), CDKN2A (p16INK4a), CHEK2, CTNNA1, DICER1, EPCAM (Deletion/duplication testing only), GREM1 (promoter region deletion/duplicat  ? GERD (gastroesophageal reflux disease)   ? GI bleed 03/15/2016  ? GOITER, MULTINODULAR 05/13/2007  ? GOITER, MULTINODULAR 05/13/2007  ?  (last TSH 3.26; US soft tissue neck in 09/2004 showed multinodular goiter with specific small nodule for which was recommended to be followed by repeat US in 3-6 months 12/2016: Thyroid US: recommend repeat in 1 year (one nodule 1.7cm on left thyroid)   ? Headache   ? History of blood transfusion   ? Hx of colonic polyps 12/04/2010  ? HYPERCHOLESTEROLEMIA 03/20/2010  ? Hyperparathyroidism ([place]) 04/11/2020  ? HYPERTENSION 03/20/2010  ? Hypertension associated with diabetes ([place]) 07/11/2018  ? Hypothyroidism 07/19/2019  ? Iron deficiency anemia 06/19/2012  ?  Low back pain 03/05/2012  ? Completed PT-notes say very motivated and had good progress.   CT (03/2020: Mild curvature convex to the right in the upper lumbar region into left in lower lumbar region. No antero or retrolisthesis in the supine position. retrolisthesis at L3-4 of 3 mm with flexion which increases to 6 mm with neutral and extension. Multifactorial spinal stenosis at L3-4 due to circumferential protrusion of the   ? Lower back pain   ? Major  depression in remission ([place]) 07/07/2018  ? Last Assessment & Plan:  Mood has seemed good on sertraline.  ? Malabsorption 03/12/2016  ? NASH (nonalcoholic steatohepatitis)   ? Nausea 07/19/2019  ? Need for immunization against influenza 04/12/2020  ? No-show for appointment 03/08/2020  ? Nonobstructive CAD  08/26/2013  ? The patient has prior cardiac workup in 2006 when she underwent a stress test for abnormal EKG. The stress test was nonconclusive and she underwent cardiac catheterization that showed 30% stenosis in the LAD in 25% stenosis in RCA she was treated medically.    ? OA (osteoarthritis)   ? OBSTRUCTIVE SLEEP APNEA 06/12/2007  ? uses CPAP.  ? Pacemaker   ? Paroxysmal atrial fibrillation ([place]) 05/11/2018  ? Last Assessment & Plan:  Patient was noted to have episodes of paroxysmal atrial fibrillation that was predominantly rate controlled and at a relatively low burden.  She is chronically on anticoagulation now with Eliquis.  She does not take any negative chronotropic agents with her sinus bradycardia.  If she has any breakthrough more prolonged tachycardic episodes and requires negative chronotropi  ? Peripheral autonomic neuropathy due to diabetes mellitus ([place]) 07/11/2018  ? PERIPHERAL NEUROPATHY 01/12/2009  ? Peripheral neuropathy 01/12/2009  ? Peripheral vascular disease ([place])   ? Presbycusis of both ears 04/11/2020  ? Primary osteoarthritis involving multiple joints 07/07/2018  ? Last Assessment & Plan:  Post op left tka and here for rehab as her husband is handicapped so she has limited help at home. Consitpation noted, some pain overnight, better with a pillow under her knee.  ? Primary osteoarthritis of left knee 04/23/2011  ? Previously seen by Dr. Alphonzo Severance. Will plan on repeat referral after trial injection.     ? PSORIASIS 05/28/2010  ? Rectal prolapse 05/24/2015  ? Rectovaginal fistula 05/25/2014  ? Renal insufficiency   ? stage 1 kd  ? S/P bariatric surgery-duodenal switch with sleeve gastrectomy  12/04/2013  ? S/P total knee arthroplasty, right 11/16/2019  ? Screening mammogram, encounter for 07/12/2019  ? Second degree AV block 02/08/2020  ? Serum calcium elevated 04/12/2020  ? Short bowel syndrome 07/11/2018  ? Spigelian hernia 02/05/2018  ? Status post left knee replacement 07/11/2018  ? Stress 05/24/2015  ? Surgical counseling visit 10/02/2019  ? Tubular adenoma of colon 03/09/2016  ? Colonoscopy in 10/2015: 3  tubular adenomas; GI recommends repeat colonoscopy in 3 years Colonoscopy August 2012: 3 tubular adenomas.   ? Type 2 diabetes, controlled, with neuropathy ([place]) 02/18/2008  ? Umbilical hernia without obstruction and without gangrene 04/15/2017  ? Unilateral primary osteoarthritis, left knee   ? UNSPECIFIED VENOUS INSUFFICIENCY 05/28/2010  ? Patient with frequent ulceration related to venous stasis-seen at wound care. Edema and Venous stasis changes due to this . Echo 04/16/11 with EF 60%. No signs diastolic dysfunction.       ? Vaccination against Streptococcus pneumoniae within past 5 years 04/12/2020  ? Vaginal irritation 07/19/2019  ? Venous stasis dermatitis of right lower extremity 01/02/2018  ?  Vitamin D deficiency 03/09/2016  ? Wears glasses   ? ?Past Surgical History:  ?Procedure Laterality Date  ? CATARACT EXTRACTION W/ INTRAOCULAR LENS  IMPLANT, BILATERAL    ? COLONOSCOPY    ? ELECTROCARDIOGRAM  04/16/2006  ? EXAMINATION UNDER ANESTHESIA N/A 06/29/2014  ? Procedure: EXAM UNDER ANESTHESIA;  Surgeon: Janyth Contes, MD;  Location: [facility];  Service: Gynecology;  Laterality: N/A;  ? Exercise myoview  01/24/2005  ? FLEXIBLE SIGMOIDOSCOPY    ? GASTRIC BYPASS  11/09/2013  ? GASTRIC BYPASS    ? JOINT REPLACEMENT    ? KNEE ARTHROSCOPY  2003  ? right  ? LAPAROSCOPY N/A 03/12/2016  ? Procedure: LAPAROSCOPIC ANASTOMOSIS OF INTESTINE (ENTEROENTEROSTOMY);  Surgeon: Ladora Daniel, MD;  Location: [facility];  Service: General;  Laterality: N/A;  ? LEG SURGERY Left   ? metal and pins in lower left leg  ? mrsa Right    ? arm  ? MULTIPLE TOOTH EXTRACTIONS    ? PACEMAKER LEADLESS INSERTION N/A 02/08/2020  ? Procedure: PACEMAKER LEADLESS INSERTION;  Surgeon: Thompson Grayer, MD;  Location: [facility];  Service: Cardiovascula

## 2021-10-31 ENCOUNTER — Telehealth: Payer: Self-pay | Admitting: Gastroenterology

## 2021-10-31 ENCOUNTER — Telehealth: Payer: Self-pay | Admitting: Family Medicine

## 2021-10-31 DIAGNOSIS — Z9884 Bariatric surgery status: Secondary | ICD-10-CM | POA: Diagnosis not present

## 2021-10-31 DIAGNOSIS — Z6836 Body mass index (BMI) 36.0-36.9, adult: Secondary | ICD-10-CM | POA: Diagnosis not present

## 2021-10-31 DIAGNOSIS — K912 Postsurgical malabsorption, not elsewhere classified: Secondary | ICD-10-CM | POA: Diagnosis not present

## 2021-10-31 NOTE — Telephone Encounter (Signed)
Good Morning Dr. Loletha Carrow, ? ?Patient called wanting to know when she was due for her next colonoscopy. I advised patient that she was not due until June of 2025. Patient stated she thought she was supposed to come back every 3 years do to her family history. Patient wanted me to double check with you that she is okay to wait until 2025 to have her next procedure. Will you please advise? ? ?Thank you.  ?

## 2021-10-31 NOTE — Telephone Encounter (Signed)
June 2025. ? ?Thanks for checking. ? ?- HD ?

## 2021-10-31 NOTE — Telephone Encounter (Signed)
Patient called needing refill on Sertraline 150m tablets. I sent her to the nurse line but she called back and stated call dropped.  ? ?Please advise. ? ?Thanks! ?

## 2021-10-31 NOTE — Telephone Encounter (Signed)
Spoke with patient and advised on recommendations. Patient stated she understood. ? ?Thank you Dr. Loletha Carrow.  ?

## 2021-11-01 ENCOUNTER — Other Ambulatory Visit: Payer: Self-pay | Admitting: Family Medicine

## 2021-11-01 DIAGNOSIS — R4586 Emotional lability: Secondary | ICD-10-CM

## 2021-11-01 MED ORDER — SERTRALINE HCL 100 MG PO TABS: 100.0000 mg | ORAL_TABLET | Freq: Every day | ORAL | 1 refills | Status: DC

## 2021-11-05 ENCOUNTER — Ambulatory Visit (INDEPENDENT_AMBULATORY_CARE_PROVIDER_SITE_OTHER): Payer: Medicare Other | Admitting: Family Medicine

## 2021-11-05 VITALS — BP 140/55 | HR 60 | Ht 67.0 in | Wt 230.6 lb

## 2021-11-05 DIAGNOSIS — R253 Fasciculation: Secondary | ICD-10-CM | POA: Diagnosis not present

## 2021-11-05 DIAGNOSIS — R251 Tremor, unspecified: Secondary | ICD-10-CM | POA: Diagnosis not present

## 2021-11-05 NOTE — Patient Instructions (Addendum)
It was great seeing you today! ? ?Today we discussed your hand twitching, we got a lot of blood work to check to see if anything else might be causing this. I will let you know of any abnormal results.  ? ?Please follow up at your next scheduled appointment 2-3 weeks for a diabetes check up, if anything arises between now and then, please don't hesitate to contact our office. ? ? ?Thank you for allowing [place] to be a part of your medical care! ? ?Thank you, ?Dr. Larae Grooms  ?

## 2021-11-05 NOTE — Progress Notes (Signed)
? ? ?  SUBJECTIVE:  ? ?CHIEF COMPLAINT / HPI:  ? ?Patient presents with hand twitching for a few months, it got worse a few days ago when she was working in the yard. She has been dropping things more over these past few weeks. Reports that she gets the tremor more with rest. 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 Sister has Parkinson's disease. This seems to occur every few days, last time it occurred was a few days ago. She is still able to mow the lawn and do all her activities but it has been difficult and she has been less efficient. Twitching/tremor has not occurred in about a week. Glucose levels have been 90-100s, denies any hypoglycemic episodes. Denies any confusion or mental status changes. She is sometimes forgetful but not any more than she usually is, she has recently had a MOCA testing and it was fine.  ? ?OBJECTIVE:  ? ?BP (!) 140/55   Pulse 60   Ht 5' 7"  (1.702 m)   Wt 230 lb 9.6 oz (104.6 kg)   SpO2 99%   BMI 36.12 kg/m?   ?General: Patient well-appearing, in no acute distress. ?CV: RRR, no murmurs or gallops auscultated ?Resp: CTAB, no wheezing, rales or rhonchi noted  ?Neuro: CN 2-12 grossly intact, 5/5 UE and LE strength bilaterally, 5/5 grips strength, normal finger to nose and heel to shin bilaterally, positive Romberg, normal gait ?Psych: mood appropriate  ? ?ASSESSMENT/PLAN:  ? ?Tremor ?-unclear if muscle twitching or resting tremor. Differential remains broad: will obtain B1, B12, CMP and CBC to assess for other etiology including vitamin deficiencies, hypoglycemia and anemia. Less likely statin induced myopathy.  ?-consider neurology if worsening or blood work inconclusive  ?-follow up in 2-3 weeks for monitoring of symptoms and for diabetes check  ?  ? ?Donney Dice, DO ?[place]  ?

## 2021-11-05 NOTE — Assessment & Plan Note (Addendum)
-  unclear if muscle twitching or resting tremor. Differential remains broad: will obtain B1, B12, CMP and CBC to assess for other etiology including vitamin deficiencies, hypoglycemia and anemia. Less likely statin induced myopathy.  ?-consider neurology if worsening or blood work inconclusive  ?-follow up in 2-3 weeks for monitoring of symptoms and for diabetes check  ?

## 2021-11-06 ENCOUNTER — Ambulatory Visit: Payer: Medicare Other | Admitting: Physical Therapy

## 2021-11-06 ENCOUNTER — Encounter: Payer: Self-pay | Admitting: Physical Therapy

## 2021-11-06 VITALS — BP 135/75 | HR 69

## 2021-11-06 DIAGNOSIS — R2689 Other abnormalities of gait and mobility: Secondary | ICD-10-CM | POA: Diagnosis not present

## 2021-11-06 DIAGNOSIS — M6281 Muscle weakness (generalized): Secondary | ICD-10-CM

## 2021-11-06 DIAGNOSIS — M5459 Other low back pain: Secondary | ICD-10-CM

## 2021-11-06 NOTE — Therapy (Signed)
?OUTPATIENT PHYSICAL THERAPY TREATMENT NOTE ? ? ?Patient Name: [name] ?MRN: [id number] ?DOB:[birth year], 66 y.o., female ?Today's Date: 11/06/2021 ? ?PCP: Donney Dice, DO ?REFERRING PROVIDER: Leeanne Rio, MD ? ?END OF SESSION:  ? PT End of Session - 11/06/21 1027   ? ? Visit Number 5   ? Number of Visits 9   8 + eval  ? Date for PT Re-Evaluation 12/21/21   ? PT Start Time 1022   ? PT Stop Time 1050   ? PT Time Calculation (min) 28 min   ? Equipment Utilized During Treatment Gait belt   ? Activity Tolerance Patient tolerated treatment well   ? Behavior During Therapy [facility] for tasks assessed/performed   ? ?  ?  ? ?  ? ? ?Past Medical History:  ?Diagnosis Date  ? Advanced directives, counseling/discussion 04/12/2020  ? Allergy   ? Anemia   ? ANEMIA, PERNICIOUS, HX OF 05/13/2007  ? Anxiety   ? Arthritis   ? ASYMPTOMATIC POSTMENOPAUSAL STATUS 02/18/2008  ? B12 deficiency 12/13/2016  ? BACK PAIN, LUMBAR 11/19/2007  ? Blood in urine 07/19/2019  ? Bowel incontinence   ? Cataract   ? Cellulitis of left lower leg 10/28/2013  ? Cellulitis of leg, right 10/11/2010  ? Chronic depression 10/06/2007  ? Chronic dermatitis of hands 05/02/2009  ? Coronary artery disease involving native coronary artery of native heart without angina pectoris 07/07/2018  ? Last Assessment & Plan:  Formatting of this note might be different from the original. The patient reports remote cardiac catheterization with up to 40% plaque.  Subsequent Cardiolite stress test in October 2019 did not show any evidence of active ischemia or prior infarction. Formatting of this note might be different from the original. medical  Last Assessment & Plan:  Formatting of this note mi  ? DEPRESSION, CHRONIC 10/06/2007  ? Diabetes mellitus without complication ([place])   ? DIABETES MELLITUS, WITH NEUROLOGICAL COMPLICATIONS 8/45/3646  ? Diabetic neuropathy ([place])   ? DYSLIPIDEMIA 05/13/2007  ? Dyslipidemia 04/15/2017  ? Last Assessment & Plan:  The patient will continue  atorvastatin for her dyslipidemia.  ? Dyslipidemia associated with type 2 diabetes mellitus ([place]) 07/11/2018  ? Essential hypertension 03/20/2010  ? Family history of colon cancer   ? Fecal incontinence 08/02/2015  ? Frozen shoulder   ? Lt  ? Full dentures   ? Gastroesophageal reflux disease without esophagitis 04/15/2017  ? Generalized abdominal pain 02/05/2018  ? Genetic testing 11/24/2017  ? Negative genetic testing on the common hereditary cancer panel.  The Hereditary Gene Panel offered by [facility] includes sequencing and/or deletion duplication testing of the following 47 genes: APC, ATM, AXIN2, BARD1, BMPR1A, BRCA1, BRCA2, BRIP1, CDH1, CDK4, CDKN2A (p14ARF), CDKN2A (p16INK4a), CHEK2, CTNNA1, DICER1, EPCAM (Deletion/duplication testing only), GREM1 (promoter region deletion/duplicat  ? GERD (gastroesophageal reflux disease)   ? GI bleed 03/15/2016  ? GOITER, MULTINODULAR 05/13/2007  ? GOITER, MULTINODULAR 05/13/2007  ?  (last TSH 3.26; US soft tissue neck in 09/2004 showed multinodular goiter with specific small nodule for which was recommended to be followed by repeat US in 3-6 months 12/2016: Thyroid US: recommend repeat in 1 year (one nodule 1.7cm on left thyroid)   ? Headache   ? History of blood transfusion   ? Hx of colonic polyps 12/04/2010  ? HYPERCHOLESTEROLEMIA 03/20/2010  ? Hyperparathyroidism ([place]) 04/11/2020  ? HYPERTENSION 03/20/2010  ? Hypertension associated with diabetes ([place]) 07/11/2018  ? Hypothyroidism 07/19/2019  ? Iron deficiency anemia 06/19/2012  ?  Low back pain 03/05/2012  ? Completed PT-notes say very motivated and had good progress.   CT (03/2020: Mild curvature convex to the right in the upper lumbar region into left in lower lumbar region. No antero or retrolisthesis in the supine position. retrolisthesis at L3-4 of 3 mm with flexion which increases to 6 mm with neutral and extension. Multifactorial spinal stenosis at L3-4 due to circumferential protrusion of the   ? Lower back pain   ? Major  depression in remission ([place]) 07/07/2018  ? Last Assessment & Plan:  Mood has seemed good on sertraline.  ? Malabsorption 03/12/2016  ? NASH (nonalcoholic steatohepatitis)   ? Nausea 07/19/2019  ? Need for immunization against influenza 04/12/2020  ? No-show for appointment 03/08/2020  ? Nonobstructive CAD  08/26/2013  ? The patient has prior cardiac workup in 2006 when she underwent a stress test for abnormal EKG. The stress test was nonconclusive and she underwent cardiac catheterization that showed 30% stenosis in the LAD in 25% stenosis in RCA she was treated medically.    ? OA (osteoarthritis)   ? OBSTRUCTIVE SLEEP APNEA 06/12/2007  ? uses CPAP.  ? Pacemaker   ? Paroxysmal atrial fibrillation ([place]) 05/11/2018  ? Last Assessment & Plan:  Patient was noted to have episodes of paroxysmal atrial fibrillation that was predominantly rate controlled and at a relatively low burden.  She is chronically on anticoagulation now with Eliquis.  She does not take any negative chronotropic agents with her sinus bradycardia.  If she has any breakthrough more prolonged tachycardic episodes and requires negative chronotropi  ? Peripheral autonomic neuropathy due to diabetes mellitus ([place]) 07/11/2018  ? PERIPHERAL NEUROPATHY 01/12/2009  ? Peripheral neuropathy 01/12/2009  ? Peripheral vascular disease ([place])   ? Presbycusis of both ears 04/11/2020  ? Primary osteoarthritis involving multiple joints 07/07/2018  ? Last Assessment & Plan:  Post op left tka and here for rehab as her husband is handicapped so she has limited help at home. Consitpation noted, some pain overnight, better with a pillow under her knee.  ? Primary osteoarthritis of left knee 04/23/2011  ? Previously seen by Dr. Alphonzo Severance. Will plan on repeat referral after trial injection.     ? PSORIASIS 05/28/2010  ? Rectal prolapse 05/24/2015  ? Rectovaginal fistula 05/25/2014  ? Renal insufficiency   ? stage 1 kd  ? S/P bariatric surgery-duodenal switch with sleeve gastrectomy  12/04/2013  ? S/P total knee arthroplasty, right 11/16/2019  ? Screening mammogram, encounter for 07/12/2019  ? Second degree AV block 02/08/2020  ? Serum calcium elevated 04/12/2020  ? Short bowel syndrome 07/11/2018  ? Spigelian hernia 02/05/2018  ? Status post left knee replacement 07/11/2018  ? Stress 05/24/2015  ? Surgical counseling visit 10/02/2019  ? Tubular adenoma of colon 03/09/2016  ? Colonoscopy in 10/2015: 3  tubular adenomas; GI recommends repeat colonoscopy in 3 years Colonoscopy August 2012: 3 tubular adenomas.   ? Type 2 diabetes, controlled, with neuropathy ([place]) 02/18/2008  ? Umbilical hernia without obstruction and without gangrene 04/15/2017  ? Unilateral primary osteoarthritis, left knee   ? UNSPECIFIED VENOUS INSUFFICIENCY 05/28/2010  ? Patient with frequent ulceration related to venous stasis-seen at wound care. Edema and Venous stasis changes due to this . Echo 04/16/11 with EF 60%. No signs diastolic dysfunction.       ? Vaccination against Streptococcus pneumoniae within past 5 years 04/12/2020  ? Vaginal irritation 07/19/2019  ? Venous stasis dermatitis of right lower extremity 01/02/2018  ?  Vitamin D deficiency 03/09/2016  ? Wears glasses   ? ?Past Surgical History:  ?Procedure Laterality Date  ? CATARACT EXTRACTION W/ INTRAOCULAR LENS  IMPLANT, BILATERAL    ? COLONOSCOPY    ? ELECTROCARDIOGRAM  04/16/2006  ? EXAMINATION UNDER ANESTHESIA N/A 06/29/2014  ? Procedure: EXAM UNDER ANESTHESIA;  Surgeon: Janyth Contes, MD;  Location: [facility];  Service: Gynecology;  Laterality: N/A;  ? Exercise myoview  01/24/2005  ? FLEXIBLE SIGMOIDOSCOPY    ? GASTRIC BYPASS  11/09/2013  ? GASTRIC BYPASS    ? JOINT REPLACEMENT    ? KNEE ARTHROSCOPY  2003  ? right  ? LAPAROSCOPY N/A 03/12/2016  ? Procedure: LAPAROSCOPIC ANASTOMOSIS OF INTESTINE (ENTEROENTEROSTOMY);  Surgeon: Ladora Daniel, MD;  Location: [facility];  Service: General;  Laterality: N/A;  ? LEG SURGERY Left   ? metal and pins in lower left leg  ? mrsa Right    ? arm  ? MULTIPLE TOOTH EXTRACTIONS    ? PACEMAKER LEADLESS INSERTION N/A 02/08/2020  ? Procedure: PACEMAKER LEADLESS INSERTION;  Surgeon: Thompson Grayer, MD;  Location: [facility];  Service: Cardiovascul

## 2021-11-07 ENCOUNTER — Telehealth: Payer: Self-pay | Admitting: Physical Therapy

## 2021-11-07 NOTE — Telephone Encounter (Signed)
PT follows up otp with [name] regarding incident in therapy yesterday.  She states she is mildly sore, but not bruised and she is doing fine.  Has not been able to monitor BP at home still.  She states she is planning to be back next session.  Will continue to follow-up. ? ?Elease Etienne, PT, DPT ? ?

## 2021-11-08 ENCOUNTER — Ambulatory Visit (INDEPENDENT_AMBULATORY_CARE_PROVIDER_SITE_OTHER): Payer: Medicare Other

## 2021-11-08 DIAGNOSIS — I441 Atrioventricular block, second degree: Secondary | ICD-10-CM | POA: Diagnosis not present

## 2021-11-09 LAB — COMPREHENSIVE METABOLIC PANEL
ALT: 7 IU/L (ref 0–32)
AST: 16 IU/L (ref 0–40)
Albumin/Globulin Ratio: 1.9 (ref 1.2–2.2)
Albumin: 4.5 g/dL (ref 3.8–4.8)
Alkaline Phosphatase: 113 IU/L (ref 44–121)
BUN/Creatinine Ratio: 25 (ref 12–28)
BUN: 33 mg/dL — ABNORMAL HIGH (ref 8–27)
Bilirubin Total: 0.4 mg/dL (ref 0.0–1.2)
CO2: 22 mmol/L (ref 20–29)
Calcium: 9.2 mg/dL (ref 8.7–10.3)
Chloride: 106 mmol/L (ref 96–106)
Creatinine, Ser: 1.33 mg/dL — ABNORMAL HIGH (ref 0.57–1.00)
Globulin, Total: 2.4 g/dL (ref 1.5–4.5)
Glucose: 86 mg/dL (ref 70–99)
Potassium: 4.7 mmol/L (ref 3.5–5.2)
Sodium: 142 mmol/L (ref 134–144)
Total Protein: 6.9 g/dL (ref 6.0–8.5)
eGFR: 44 mL/min/{1.73_m2} — ABNORMAL LOW (ref >59)

## 2021-11-09 LAB — VITAMIN B1: Thiamine: 88.4 nmol/L (ref 66.5–200.0)

## 2021-11-09 LAB — CBC
Hematocrit: 38.2 % (ref 34.0–46.6)
Hemoglobin: 12.3 g/dL (ref 11.1–15.9)
MCH: 25.7 pg — ABNORMAL LOW (ref 26.6–33.0)
MCHC: 32.2 g/dL (ref 31.5–35.7)
MCV: 80 fL (ref 79–97)
Platelets: 238 10*3/uL (ref 150–450)
RBC: 4.79 x10E6/uL (ref 3.77–5.28)
RDW: 14.3 % (ref 11.7–15.4)
WBC: 4.8 10*3/uL (ref 3.4–10.8)

## 2021-11-09 LAB — VITAMIN B12: Vitamin B-12: 231 pg/mL — ABNORMAL LOW (ref 232–1245)

## 2021-11-11 LAB — CUP PACEART REMOTE DEVICE CHECK
Battery Remaining Longevity: 96 mo
Battery Voltage: 3.02 V
Brady Statistic RV Percent Paced: 15.57 %
Date Time Interrogation Session: 20230520020100
Implantable Pulse Generator Implant Date: 20210817
Lead Channel Impedance Value: 540 Ohm
Lead Channel Pacing Threshold Amplitude: 0.625 V
Lead Channel Pacing Threshold Pulse Width: 0.4 ms
Lead Channel Sensing Intrinsic Amplitude: 17.888 mV
Lead Channel Setting Pacing Amplitude: 1.25 V
Lead Channel Setting Pacing Pulse Width: 0.4 ms
Lead Channel Setting Sensing Sensitivity: 2 mV

## 2021-11-13 ENCOUNTER — Encounter: Payer: Self-pay | Admitting: Physical Therapy

## 2021-11-13 ENCOUNTER — Ambulatory Visit: Payer: Medicare Other | Admitting: Physical Therapy

## 2021-11-13 VITALS — BP 143/65 | HR 54

## 2021-11-13 DIAGNOSIS — M6281 Muscle weakness (generalized): Secondary | ICD-10-CM | POA: Diagnosis not present

## 2021-11-13 DIAGNOSIS — R2689 Other abnormalities of gait and mobility: Secondary | ICD-10-CM

## 2021-11-13 DIAGNOSIS — M5459 Other low back pain: Secondary | ICD-10-CM | POA: Diagnosis not present

## 2021-11-13 NOTE — Therapy (Signed)
OUTPATIENT PHYSICAL THERAPY TREATMENT NOTE   Patient Name: ASENETH HACK MRN: [id number] DOB:[birth year], 66 y.o., female Today's Date: 11/13/2021  PCP: Donney Dice, DO REFERRING PROVIDER: Donney Dice, DO  END OF SESSION:   PT End of Session - 11/13/21 1108     Visit Number 6    Number of Visits 9   8 + eval   Date for PT Re-Evaluation 12/21/21    PT Start Time 1106    PT Stop Time 1148    PT Time Calculation (min) 42 min    Equipment Utilized During Treatment Gait belt    Activity Tolerance Patient tolerated treatment well    Behavior During Therapy WFL for tasks assessed/performed             Past Medical History:  Diagnosis Date   Advanced directives, counseling/discussion 04/12/2020   Allergy    Anemia    ANEMIA, PERNICIOUS, HX OF 05/13/2007   Anxiety    Arthritis    ASYMPTOMATIC POSTMENOPAUSAL STATUS 02/18/2008   B12 deficiency 12/13/2016   BACK PAIN, LUMBAR 11/19/2007   Blood in urine 07/19/2019   Bowel incontinence    Cataract    Cellulitis of left lower leg 10/28/2013   Cellulitis of leg, right 10/11/2010   Chronic depression 10/06/2007   Chronic dermatitis of hands 05/02/2009   Coronary artery disease involving native coronary artery of native heart without angina pectoris 07/07/2018   Last Assessment & Plan:  Formatting of this note might be different from the original. The patient reports remote cardiac catheterization with up to 40% plaque.  Subsequent Cardiolite stress test in October 2019 did not show any evidence of active ischemia or prior infarction. Formatting of this note might be different from the original. medical  Last Assessment & Plan:  Formatting of this note mi   DEPRESSION, CHRONIC 10/06/2007   Diabetes mellitus without complication ([place])    DIABETES MELLITUS, WITH NEUROLOGICAL COMPLICATIONS 02/15/2352   Diabetic neuropathy ([place])    DYSLIPIDEMIA 05/13/2007   Dyslipidemia 04/15/2017   Last Assessment & Plan:  The patient will continue  atorvastatin for her dyslipidemia.   Dyslipidemia associated with type 2 diabetes mellitus ([place]) 07/11/2018   Essential hypertension 03/20/2010   Family history of colon cancer    Fecal incontinence 08/02/2015   Frozen shoulder    Lt   Full dentures    Gastroesophageal reflux disease without esophagitis 04/15/2017   Generalized abdominal pain 02/05/2018   Genetic testing 11/24/2017   Negative genetic testing on the common hereditary cancer panel.  The Hereditary Gene Panel offered by [facility] includes sequencing and/or deletion duplication testing of the following 47 genes: APC, ATM, AXIN2, BARD1, BMPR1A, BRCA1, BRCA2, BRIP1, CDH1, CDK4, CDKN2A (p14ARF), CDKN2A (p16INK4a), CHEK2, CTNNA1, DICER1, EPCAM (Deletion/duplication testing only), GREM1 (promoter region deletion/duplicat   GERD (gastroesophageal reflux disease)    GI bleed 03/15/2016   GOITER, MULTINODULAR 05/13/2007   GOITER, MULTINODULAR 05/13/2007    (last TSH 3.26; US soft tissue neck in 09/2004 showed multinodular goiter with specific small nodule for which was recommended to be followed by repeat US in 3-6 months 12/2016: Thyroid US: recommend repeat in 1 year (one nodule 1.7cm on left thyroid)    Headache    History of blood transfusion    Hx of colonic polyps 12/04/2010   HYPERCHOLESTEROLEMIA 03/20/2010   Hyperparathyroidism ([place]) 04/11/2020   HYPERTENSION 03/20/2010   Hypertension associated with diabetes ([place]) 07/11/2018   Hypothyroidism 07/19/2019   Iron deficiency anemia 06/19/2012   Low  back pain 03/05/2012   Completed PT-notes say very motivated and had good progress.   CT (03/2020: Mild curvature convex to the right in the upper lumbar region into left in lower lumbar region. No antero or retrolisthesis in the supine position. retrolisthesis at L3-4 of 3 mm with flexion which increases to 6 mm with neutral and extension. Multifactorial spinal stenosis at L3-4 due to circumferential protrusion of the    Lower back pain    Major  depression in remission ([place]) 07/07/2018   Last Assessment & Plan:  Mood has seemed good on sertraline.   Malabsorption 03/12/2016   NASH (nonalcoholic steatohepatitis)    Nausea 07/19/2019   Need for immunization against influenza 04/12/2020   No-show for appointment 03/08/2020   Nonobstructive CAD  08/26/2013   The patient has prior cardiac workup in 2006 when she underwent a stress test for abnormal EKG. The stress test was nonconclusive and she underwent cardiac catheterization that showed 30% stenosis in the LAD in 25% stenosis in RCA she was treated medically.     OA (osteoarthritis)    OBSTRUCTIVE SLEEP APNEA 06/12/2007   uses CPAP.   Pacemaker    Paroxysmal atrial fibrillation ([place]) 05/11/2018   Last Assessment & Plan:  Patient was noted to have episodes of paroxysmal atrial fibrillation that was predominantly rate controlled and at a relatively low burden.  She is chronically on anticoagulation now with Eliquis.  She does not take any negative chronotropic agents with her sinus bradycardia.  If she has any breakthrough more prolonged tachycardic episodes and requires negative chronotropi   Peripheral autonomic neuropathy due to diabetes mellitus ([place]) 07/11/2018   PERIPHERAL NEUROPATHY 01/12/2009   Peripheral neuropathy 01/12/2009   Peripheral vascular disease ([place])    Presbycusis of both ears 04/11/2020   Primary osteoarthritis involving multiple joints 07/07/2018   Last Assessment & Plan:  Post op left tka and here for rehab as her husband is handicapped so she has limited help at home. Consitpation noted, some pain overnight, better with a pillow under her knee.   Primary osteoarthritis of left knee 04/23/2011   Previously seen by Dr. Alphonzo Severance. Will plan on repeat referral after trial injection.      PSORIASIS 05/28/2010   Rectal prolapse 05/24/2015   Rectovaginal fistula 05/25/2014   Renal insufficiency    stage 1 kd   S/P bariatric surgery-duodenal switch with sleeve gastrectomy  12/04/2013   S/P total knee arthroplasty, right 11/16/2019   Screening mammogram, encounter for 07/12/2019   Second degree AV block 02/08/2020   Serum calcium elevated 04/12/2020   Short bowel syndrome 07/11/2018   Spigelian hernia 02/05/2018   Status post left knee replacement 07/11/2018   Stress 05/24/2015   Surgical counseling visit 10/02/2019   Tubular adenoma of colon 03/09/2016   Colonoscopy in 10/2015: 3  tubular adenomas; GI recommends repeat colonoscopy in 3 years Colonoscopy August 2012: 3 tubular adenomas.    Type 2 diabetes, controlled, with neuropathy ([place]) 07/25/69   Umbilical hernia without obstruction and without gangrene 04/15/2017   Unilateral primary osteoarthritis, left knee    UNSPECIFIED VENOUS INSUFFICIENCY 05/28/2010   Patient with frequent ulceration related to venous stasis-seen at wound care. Edema and Venous stasis changes due to this . Echo 04/16/11 with EF 60%. No signs diastolic dysfunction.        Vaccination against Streptococcus pneumoniae within past 5 years 04/12/2020   Vaginal irritation 07/19/2019   Venous stasis dermatitis of right lower extremity 01/02/2018  Vitamin D deficiency 03/09/2016   Wears glasses    Past Surgical History:  Procedure Laterality Date   CATARACT EXTRACTION W/ INTRAOCULAR LENS  IMPLANT, BILATERAL     COLONOSCOPY     ELECTROCARDIOGRAM  04/16/2006   EXAMINATION UNDER ANESTHESIA N/A 06/29/2014   Procedure: EXAM UNDER ANESTHESIA;  Surgeon: Janyth Contes, MD;  Location: [facility];  Service: Gynecology;  Laterality: N/A;   Exercise myoview  01/24/2005   FLEXIBLE SIGMOIDOSCOPY     GASTRIC BYPASS  11/09/2013   GASTRIC BYPASS     JOINT REPLACEMENT     KNEE ARTHROSCOPY  2003   right   LAPAROSCOPY N/A 03/12/2016   Procedure: LAPAROSCOPIC ANASTOMOSIS OF INTESTINE (ENTEROENTEROSTOMY);  Surgeon: Ladora Daniel, MD;  Location: [facility];  Service: General;  Laterality: N/A;   LEG SURGERY Left    metal and pins in lower left leg   mrsa Right     arm   MULTIPLE TOOTH EXTRACTIONS     PACEMAKER LEADLESS INSERTION N/A 02/08/2020   Procedure: PACEMAKER LEADLESS INSERTION;  Surgeon: Thompson Grayer, MD;  Location: [facility];  Service: Cardiovascular;  Laterality: N/A;   PARATHYROIDECTOMY Left 02/19/2021   Procedure: LEFT PARATHYROIDECTOMY WITH FROZEN SECTION;  Surgeon: Izora Gala, MD;  Location: [facility];  Service: ENT;  Laterality: Left;   SHOULDER ARTHROSCOPY W/ ROTATOR CUFF REPAIR Right    stab phlebectomy  Right 02/10/2019   stab phlebectomy > 20 incisions right leg by [name] MD    TONSILLECTOMY     TOTAL KNEE ARTHROPLASTY Left 06/30/2018   Procedure: LEFT TOTAL KNEE ARTHROPLASTY;  Surgeon: Meredith Pel, MD;  Location: [facility];  Service: Orthopedics;  Laterality: Left;   TOTAL KNEE ARTHROPLASTY Right 11/16/2019   Procedure: RIGHT TOTAL KNEE ARTHROPLASTY-CEMENTED;  Surgeon: Meredith Pel, MD;  Location: [facility];  Service: Orthopedics;  Laterality: Right;   VARICOSE VEIN SURGERY     Remotef   Patient Active Problem List   Diagnosis Date Noted   Tremor 11/05/2021   Mood changes 07/17/2021   Neck muscle spasm 03/17/2021   Type 2 DM with diabetic neuropathy affecting both sides of body ([place]) 01/09/2021   Right leg swelling 12/27/2020   Parathyroid adenoma 11/23/2020   Osteoporosis screening 11/16/2020   Memory loss 08/07/2020   Serum calcium elevated 04/12/2020   Hallux rigidus of left foot 04/12/2020   Hyperparathyroidism ([place]) 04/11/2020   Presbycusis of both ears 04/11/2020   No-show for appointment 03/08/2020   Pacemaker    Hypothyroidism 07/19/2019   Hypertension associated with diabetes ([place]) 07/11/2018   Hyperlipidemia associated with type 2 diabetes mellitus ([place]) 07/11/2018   Short bowel syndrome 07/11/2018   Status post left knee replacement 07/11/2018   Major depression in remission ([place]) 07/07/2018   Coronary artery disease involving native coronary artery of native heart without angina  pectoris 07/07/2018   Paroxysmal atrial fibrillation ([place]) 05/11/2018   Age-related cognitive decline 04/30/2018   Gastroesophageal reflux disease without esophagitis 04/15/2017   B12 deficiency 12/13/2016   Tubular adenoma of colon 03/09/2016   Vitamin D deficiency 03/09/2016   S/P bariatric surgery-duodenal switch with sleeve gastrectomy 12/04/2013   Morbid obesity ([place]) 08/26/2013   Iron deficiency anemia 06/19/2012   Low back pain 03/05/2012   PSORIASIS 05/28/2010   Peripheral neuropathy 01/12/2009   Type 2 diabetes, controlled, with neuropathy ([place]) 02/18/2008   Obesity with sleep apnea 06/12/2007   ONSET DATE: 10/08/2021    REFERRING DIAG: M54.41 (ICD-10-CM) - Midline low back  pain with right-sided sciatica, unspecified chronicity    THERAPY DIAG:  Other low back pain   Other abnormalities of gait and mobility   Muscle weakness (generalized)     Right BP/HR taken end of session.                                                                                        Today's Vitals   11/13/21 1112  BP: (!) 143/65  Pulse: (!) 54     SUBJECTIVE STATEMENT: Pt states she plans to call primary MD today about follow-up to blood work.  She states she feels like her feet trip her up due to her foot size. Pt accompanied by: self   PERTINENT HISTORY: Anemia, Anxiety, Arthritis, B12 deficiency, chronic LBP, bowel incontinence, cataract, BLE cellulitis, chronic depression, CAD w/o angina, DM2 w/ subsequent neuropathy, HTN, left frozen shoulder, GERD, multinodular goiter, hypercholestremia, hyperparathyroidism, NASH, obstructive sleep apnea, pacemaker, paroxysmal A-Fib, venous insufficiency, vit D deficiency, second degree AV block, Bilateral TKAs, psoriasis     PRECAUTIONS: Fall and ICD/Pacemaker   PAIN:   Are you having pain? Yes: NPRS scale: 2/10 Pain location: left arm  Pain description: burning Aggravating factors: N/A Relieving factors: N/A   She states it is from  shingles shot yesterday. TODAY'S TREATMENT:  Assessed 10MWT:  10.19 sec w/o AD = 0.98 m/sec OR 3.24 ft/sec Modified HEP.  See bolded below.  PATIENT EDUCATION: Education details: Edu on continued follow-up with PCP and additions to HEP.  Initiated conversation about discharge in coming visits due to progress noted today and to allow pt to continue follow-up with other clinicians regarding concerns as well as to allow her time to focus on caregiver duties and stress management.  Verbally reviewed body mechanics for caregiver lifting tasks. Person educated: Patient; Daughter-Nicole Education method: Explanation, demonstrated, returned demonstrated, needs reinforcement  Education comprehension: verbalized understanding     HOME EXERCISE PROGRAM: Access Code: [email or web address] URL: [email or web address] Date: 10/16/2021 Prepared by: Cameron Sprang  Exercises - Supine Bicycles  - 1 x daily - 7 x weekly - 4 sets - 5 reps - Supine Double Leg Lift  - 1 x daily - 7 x weekly - 4 sets - 5 reps - Supine Posterior Pelvic Tilt  - 1 x daily - 7 x weekly - 2 sets - 10 reps - Seated Table Hamstring Stretch  - 1 x daily - 7 x weekly - 1 sets - 3 reps - 30 secs hold Added 11/13/2021  - Standing Balance in Corner with Eyes Closed  - 1 x daily - 5 x weekly - 1 sets - 4 reps - 30 seconds hold - Tandem Stance with Eyes Closed in Corner  - 1 x daily - 5 x weekly - 1 sets - 4 reps - 30 seconds hold - Standing Near Stance in [place] with Eyes Closed  - 1 x daily - 5 x weekly - 1 sets - 4 reps - 30 seconds hold - Standing with Head Rotation  - 1 x daily - 5 x weekly - 1 sets - 4 reps - 30 seconds hold - Tandem Walking with Counter Support  -  1 x daily - 5 x weekly - 3 sets - 10 reps  - Standing March with Counter Support  - 1 x daily - 5 x weekly - 2 sets - 20 reps - Sit to Stand with Armchair  - 1 x daily - 5 x weekly - 2 sets - 10 reps   GOALS: Goals reviewed with patient? Yes   SHORT TERM GOALS: Target  date:  11/09/2021   Pt will be independent with strength and balance HEP. Baseline:  Established, needs advanced balance additions. Goal status: MET   2.  Pt will decrease 5xSTS to <16 seconds w/ BUE support as needed in order to demonstrate decreased risk for falls and improved functional bilateral LE strength and power. Baseline: 18.50 sec BUE support; 14.71 seconds w/ BUE support Goal status: MET   3.  Pt will ambulate>400 feet on 2MWT to demonstrate improved endurance for functional tasks in home and community. Baseline: 315'; 294' Goal status: NOT MET   4.  Pt will demonstrate a gait speed of >3 feet/sec in order to decrease risk for falls. Baseline: 2.87 ft/sec; 3.24 ft/sec no AD Goal status: MET   5.  FGA to be assessed with appropriate LTG set. Baseline: Assessed w/ LTG set. Goal status: MET   LONG TERM GOALS: Target date:  12/07/2021   Pt will be independent with advanced strength and balance HEP and walking program. Baseline: to be established Goal status: INITIAL   2.  Pt will decrease 5xSTS to <13 seconds w/o BUE support in order to demonstrate decreased risk for falls and improved functional bilateral LE strength and power. Baseline: 18.50 sec BUE support Goal status: INITIAL   3.  Pt will ambulate >/=750 feet with LRAD and independent level of assist w/o complaint of LBP to promote household and community access. Baseline: 315' supervision no AD Goal status: INITIAL   4.  Pt will demonstrate a gait speed of >3.4 feet/sec in order to decrease risk for falls. Baseline: 2.87 ft/sec; 3.24 ft/sec 11/13/2021 Goal status: REVISED   5.  Pt will improve FGA score to 20/30 in order to demonstrate improved balance and decreased fall risk. Baseline: 14/30 10/22/2021 Goal status: INITIAL  6.  Pt will report decreased pain when providing care to dependent husband at home. Baseline: frequent discomfort that she associates to caregiving tasks  Goal status:  INITIAL    ASSESSMENT:   CLINICAL IMPRESSION: Today's skilled session focused on modifying and adding to existing HEP to address balance and BLE strength.  Pt requires mild redirection to tasks throughout session and she verbalizes disdain for use of the gait belt throughout.  She remains challenged by visually limited conditions and some difficulty with multi-step directions this session.  Will continue to address deficits to progress towards LTGs.   OBJECTIVE IMPAIRMENTS Abnormal gait, decreased activity tolerance, decreased balance, decreased coordination, decreased endurance, decreased knowledge of use of DME, decreased strength, and postural dysfunction.    ACTIVITY LIMITATIONS community activity, yard work, and caring for w/c bound husband .    PERSONAL FACTORS Age, Behavior pattern, Education, Fitness, Past/current experiences, and Social background are also affecting patient's functional outcome.      REHAB POTENTIAL: Fair See personal factors and PMH   CLINICAL DECISION MAKING: Stable/uncomplicated   EVALUATION COMPLEXITY: Low   PLAN: PT FREQUENCY: 1x/week   PT DURATION: 8 weeks   PLANNED INTERVENTIONS: Therapeutic exercises, Therapeutic activity, Neuromuscular re-education, Balance training, Gait training, Patient/Family education, Joint mobilization, Stair training, Vestibular training, DME  instructions, Spinal mobilization, Cryotherapy, Moist heat, Biofeedback, and Manual therapy   PLAN FOR NEXT SESSION: Trial trekking poles on various surfaces as pt uses these on her path at home when walking.  Advance HEP prn. Balance training with narrow BOS, head turns, eyes closed, hurdles, SLS-step taps/up and down/cones, gait training no AD on multi-level surfaces    Elease Etienne, PT, DPT 11/13/21, 12:20 PM

## 2021-11-13 NOTE — Patient Instructions (Signed)
Access Code: [email or web address] URL: [email or web address] Date: 10/16/2021 Prepared by: Cameron Sprang  Exercises - Supine Bicycles  - 1 x daily - 7 x weekly - 4 sets - 5 reps - Supine Double Leg Lift  - 1 x daily - 7 x weekly - 4 sets - 5 reps - Supine Posterior Pelvic Tilt  - 1 x daily - 7 x weekly - 2 sets - 10 reps - Seated Table Hamstring Stretch  - 1 x daily - 7 x weekly - 1 sets - 3 reps - 30 secs hold Added 11/13/2021  - Standing Balance in Corner with Eyes Closed  - 1 x daily - 5 x weekly - 1 sets - 4 reps - 30 seconds hold - Tandem Stance with Eyes Closed in Corner  - 1 x daily - 5 x weekly - 1 sets - 4 reps - 30 seconds hold - Standing Near Stance in [place] with Eyes Closed  - 1 x daily - 5 x weekly - 1 sets - 4 reps - 30 seconds hold - Standing with Head Rotation  - 1 x daily - 5 x weekly - 1 sets - 4 reps - 30 seconds hold - Tandem Walking with Counter Support  - 1 x daily - 5 x weekly - 3 sets - 10 reps  - Standing March with Counter Support  - 1 x daily - 5 x weekly - 2 sets - 20 reps - Sit to Stand with Armchair  - 1 x daily - 5 x weekly - 2 sets - 10 reps

## 2021-11-15 DIAGNOSIS — I872 Venous insufficiency (chronic) (peripheral): Secondary | ICD-10-CM | POA: Diagnosis not present

## 2021-11-15 DIAGNOSIS — I87393 Chronic venous hypertension (idiopathic) with other complications of bilateral lower extremity: Secondary | ICD-10-CM | POA: Diagnosis not present

## 2021-11-16 NOTE — Progress Notes (Signed)
Remote pacemaker transmission.   

## 2021-11-20 ENCOUNTER — Ambulatory Visit: Payer: Medicare Other | Admitting: Physical Therapy

## 2021-11-21 DIAGNOSIS — I83813 Varicose veins of bilateral lower extremities with pain: Secondary | ICD-10-CM | POA: Diagnosis not present

## 2021-11-23 ENCOUNTER — Encounter: Payer: Self-pay | Admitting: Family Medicine

## 2021-11-23 ENCOUNTER — Ambulatory Visit (INDEPENDENT_AMBULATORY_CARE_PROVIDER_SITE_OTHER): Payer: Medicare Other | Admitting: Family Medicine

## 2021-11-23 VITALS — BP 144/75 | HR 61 | Ht 67.0 in | Wt 223.2 lb

## 2021-11-23 DIAGNOSIS — I152 Hypertension secondary to endocrine disorders: Secondary | ICD-10-CM

## 2021-11-23 DIAGNOSIS — E1159 Type 2 diabetes mellitus with other circulatory complications: Secondary | ICD-10-CM

## 2021-11-23 DIAGNOSIS — E1140 Type 2 diabetes mellitus with diabetic neuropathy, unspecified: Secondary | ICD-10-CM

## 2021-11-23 DIAGNOSIS — R251 Tremor, unspecified: Secondary | ICD-10-CM

## 2021-11-23 LAB — POCT GLYCOSYLATED HEMOGLOBIN (HGB A1C): HbA1c, POC (controlled diabetic range): 6.9 % (ref 0.0–7.0)

## 2021-11-23 NOTE — Assessment & Plan Note (Signed)
-  BP 170/70, repeat 144/75 -continue amlodipine and lisinopril-HCTZ therapy -monitor as appropriate

## 2021-11-23 NOTE — Assessment & Plan Note (Addendum)
-  prior episodes of muscle twitching vs tremor, still unclear of exact etiology. Multiple labs performed last visit to assess for possible etiology, no anemia, B1 wnl, B12 slightly low so instructed to take multivitamin. These episodes have resolved which is reassuring  -blood work discussed extensively with patient again  -continue to monitor symptoms  -follow up in 2-3 months or sooner as appropriate

## 2021-11-23 NOTE — Patient Instructions (Addendum)
It was great seeing you today!  Today we discussed your diabetes. Your A1c is 6.9, please taking your metformin. Make sure to eat a balanced diet incorporating vegetables and stay active by going on walks and doing aerobic exercises.   Your labs last time overall all looked good, I am glad that the hand twitching improved.   Please follow up at your next scheduled appointment in 2-3 months, if anything arises between now and then, please don't hesitate to contact our office.   Thank you for allowing [place] to be a part of your medical care!  Thank you, Dr. Larae Grooms

## 2021-11-23 NOTE — Progress Notes (Signed)
    SUBJECTIVE:   Patient presents for DM follow up. She has been trying to eating a balanced diet and trying to do a lot of walks a lot. Her husband is wheelchair bound and she takes him out and he rides while she goes out walking. Compliant on metformin regimen. Almost due for her ophthalmology appointment in a few months.   Followed up recently regarding having intermittent hand twitching that was interfering with her picking up things at times.  Prior hand twitching is better, not dropping things. Recent blood work was drawn during the last visit. Going on walks recently, uses ski pole.   OBJECTIVE:   BP (!) 144/75   Pulse 61   Ht 5' 7"  (1.702 m)   Wt 223 lb 4 oz (101.3 kg)   SpO2 98%   BMI 34.97 kg/m   General: Patient well-appearing, in no acute distress. CV: RRR, no murmurs or gallops auscultated Resp: CTAB, no wheezing, rales or rhonchi  Abdomen: soft, nontender, nondistended, presence of bowel sounds Ext: no LE edema noted Psych: mood appropriate   ASSESSMENT/PLAN:   Type 2 diabetes, controlled, with neuropathy ([facility]) -A1c 6.9, well-controlled -continue metformin therapy -next optho visit in August -continue gabapentin for neuropathy, stable  -extensive diet and exercise counseling provided  Tremor -prior episodes of muscle twitching vs tremor, still unclear of exact etiology. Multiple labs performed last visit to assess for possible etiology, no anemia, B1 wnl, B12 slightly low so instructed to take multivitamin. These episodes have resolved which is reassuring  -blood work discussed extensively with patient again  -continue to monitor symptoms  -follow up in 2-3 months or sooner as appropriate   Hypertension associated with diabetes ([place]) -BP 170/70, repeat 144/75 -continue amlodipine and lisinopril-HCTZ therapy -monitor as appropriate   -Med rec reviewed and updated appropriately   Donney Dice, [place]

## 2021-11-23 NOTE — Assessment & Plan Note (Signed)
-  A1c 6.9, well-controlled -continue metformin therapy -next optho visit in August -continue gabapentin for neuropathy, stable  -extensive diet and exercise counseling provided

## 2021-11-27 ENCOUNTER — Encounter: Payer: Self-pay | Admitting: *Deleted

## 2021-11-27 ENCOUNTER — Ambulatory Visit: Payer: Medicare Other | Attending: Family Medicine | Admitting: Physical Therapy

## 2021-11-27 ENCOUNTER — Encounter: Payer: Self-pay | Admitting: Physical Therapy

## 2021-11-27 VITALS — BP 140/78

## 2021-11-27 DIAGNOSIS — R2689 Other abnormalities of gait and mobility: Secondary | ICD-10-CM | POA: Insufficient documentation

## 2021-11-27 DIAGNOSIS — M5459 Other low back pain: Secondary | ICD-10-CM | POA: Diagnosis not present

## 2021-11-27 DIAGNOSIS — M6281 Muscle weakness (generalized): Secondary | ICD-10-CM | POA: Diagnosis not present

## 2021-11-27 NOTE — Therapy (Signed)
OUTPATIENT PHYSICAL THERAPY TREATMENT NOTE   Patient Name: TONEA LEIPHART MRN: [id number] DOB:[birth year], 66 y.o., female Today's Date: 11/27/2021  PCP: Donney Dice, DO REFERRING PROVIDER: Donney Dice, DO  END OF SESSION:   PT End of Session - 11/27/21 1109     Visit Number 7    Number of Visits 9   8 + eval   Date for PT Re-Evaluation 12/21/21    PT Start Time 1105    PT Stop Time 1146    PT Time Calculation (min) 41 min    Equipment Utilized During Treatment Gait belt    Activity Tolerance Patient tolerated treatment well    Behavior During Therapy WFL for tasks assessed/performed             Past Medical History:  Diagnosis Date   Advanced directives, counseling/discussion 04/12/2020   Allergy    Anemia    ANEMIA, PERNICIOUS, HX OF 05/13/2007   Anxiety    Arthritis    ASYMPTOMATIC POSTMENOPAUSAL STATUS 02/18/2008   B12 deficiency 12/13/2016   BACK PAIN, LUMBAR 11/19/2007   Blood in urine 07/19/2019   Bowel incontinence    Cataract    Cellulitis of left lower leg 10/28/2013   Cellulitis of leg, right 10/11/2010   Chronic depression 10/06/2007   Chronic dermatitis of hands 05/02/2009   Coronary artery disease involving native coronary artery of native heart without angina pectoris 07/07/2018   Last Assessment & Plan:  Formatting of this note might be different from the original. The patient reports remote cardiac catheterization with up to 40% plaque.  Subsequent Cardiolite stress test in October 2019 did not show any evidence of active ischemia or prior infarction. Formatting of this note might be different from the original. medical  Last Assessment & Plan:  Formatting of this note mi   DEPRESSION, CHRONIC 10/06/2007   Diabetes mellitus without complication ([place])    DIABETES MELLITUS, WITH NEUROLOGICAL COMPLICATIONS 3/53/2992   Diabetic neuropathy ([place])    DYSLIPIDEMIA 05/13/2007   Dyslipidemia 04/15/2017   Last Assessment & Plan:  The patient will continue atorvastatin  for her dyslipidemia.   Dyslipidemia associated with type 2 diabetes mellitus ([place]) 07/11/2018   Essential hypertension 03/20/2010   Family history of colon cancer    Fecal incontinence 08/02/2015   Frozen shoulder    Lt   Full dentures    Gastroesophageal reflux disease without esophagitis 04/15/2017   Generalized abdominal pain 02/05/2018   Genetic testing 11/24/2017   Negative genetic testing on the common hereditary cancer panel.  The Hereditary Gene Panel offered by [facility] includes sequencing and/or deletion duplication testing of the following 47 genes: APC, ATM, AXIN2, BARD1, BMPR1A, BRCA1, BRCA2, BRIP1, CDH1, CDK4, CDKN2A (p14ARF), CDKN2A (p16INK4a), CHEK2, CTNNA1, DICER1, EPCAM (Deletion/duplication testing only), GREM1 (promoter region deletion/duplicat   GERD (gastroesophageal reflux disease)    GI bleed 03/15/2016   GOITER, MULTINODULAR 05/13/2007   GOITER, MULTINODULAR 05/13/2007    (last TSH 3.26; US soft tissue neck in 09/2004 showed multinodular goiter with specific small nodule for which was recommended to be followed by repeat US in 3-6 months 12/2016: Thyroid US: recommend repeat in 1 year (one nodule 1.7cm on left thyroid)    Headache    History of blood transfusion    Hx of colonic polyps 12/04/2010   HYPERCHOLESTEROLEMIA 03/20/2010   Hyperparathyroidism ([place]) 04/11/2020   HYPERTENSION 03/20/2010   Hypertension associated with diabetes ([place]) 07/11/2018   Hypothyroidism 07/19/2019   Iron deficiency anemia 06/19/2012   Low  back pain 03/05/2012   Completed PT-notes say very motivated and had good progress.   CT (03/2020: Mild curvature convex to the right in the upper lumbar region into left in lower lumbar region. No antero or retrolisthesis in the supine position. retrolisthesis at L3-4 of 3 mm with flexion which increases to 6 mm with neutral and extension. Multifactorial spinal stenosis at L3-4 due to circumferential protrusion of the    Lower back pain    Major depression in  remission ([place]) 07/07/2018   Last Assessment & Plan:  Mood has seemed good on sertraline.   Malabsorption 03/12/2016   NASH (nonalcoholic steatohepatitis)    Nausea 07/19/2019   Need for immunization against influenza 04/12/2020   No-show for appointment 03/08/2020   Nonobstructive CAD  08/26/2013   The patient has prior cardiac workup in 2006 when she underwent a stress test for abnormal EKG. The stress test was nonconclusive and she underwent cardiac catheterization that showed 30% stenosis in the LAD in 25% stenosis in RCA she was treated medically.     OA (osteoarthritis)    OBSTRUCTIVE SLEEP APNEA 06/12/2007   uses CPAP.   Pacemaker    Paroxysmal atrial fibrillation ([place]) 05/11/2018   Last Assessment & Plan:  Patient was noted to have episodes of paroxysmal atrial fibrillation that was predominantly rate controlled and at a relatively low burden.  She is chronically on anticoagulation now with Eliquis.  She does not take any negative chronotropic agents with her sinus bradycardia.  If she has any breakthrough more prolonged tachycardic episodes and requires negative chronotropi   Peripheral autonomic neuropathy due to diabetes mellitus ([place]) 07/11/2018   PERIPHERAL NEUROPATHY 01/12/2009   Peripheral neuropathy 01/12/2009   Peripheral vascular disease ([place])    Presbycusis of both ears 04/11/2020   Primary osteoarthritis involving multiple joints 07/07/2018   Last Assessment & Plan:  Post op left tka and here for rehab as her husband is handicapped so she has limited help at home. Consitpation noted, some pain overnight, better with a pillow under her knee.   Primary osteoarthritis of left knee 04/23/2011   Previously seen by Dr. Alphonzo Severance. Will plan on repeat referral after trial injection.      PSORIASIS 05/28/2010   Rectal prolapse 05/24/2015   Rectovaginal fistula 05/25/2014   Renal insufficiency    stage 1 kd   S/P bariatric surgery-duodenal switch with sleeve gastrectomy 12/04/2013   S/P  total knee arthroplasty, right 11/16/2019   Screening mammogram, encounter for 07/12/2019   Second degree AV block 02/08/2020   Serum calcium elevated 04/12/2020   Short bowel syndrome 07/11/2018   Spigelian hernia 02/05/2018   Status post left knee replacement 07/11/2018   Stress 05/24/2015   Surgical counseling visit 10/02/2019   Tubular adenoma of colon 03/09/2016   Colonoscopy in 10/2015: 3  tubular adenomas; GI recommends repeat colonoscopy in 3 years Colonoscopy August 2012: 3 tubular adenomas.    Type 2 diabetes, controlled, with neuropathy ([place]) 1/91/4782   Umbilical hernia without obstruction and without gangrene 04/15/2017   Unilateral primary osteoarthritis, left knee    UNSPECIFIED VENOUS INSUFFICIENCY 05/28/2010   Patient with frequent ulceration related to venous stasis-seen at wound care. Edema and Venous stasis changes due to this . Echo 04/16/11 with EF 60%. No signs diastolic dysfunction.        Vaccination against Streptococcus pneumoniae within past 5 years 04/12/2020   Vaginal irritation 07/19/2019   Venous stasis dermatitis of right lower extremity 01/02/2018  Vitamin D deficiency 03/09/2016   Wears glasses    Past Surgical History:  Procedure Laterality Date   CATARACT EXTRACTION W/ INTRAOCULAR LENS  IMPLANT, BILATERAL     COLONOSCOPY     ELECTROCARDIOGRAM  04/16/2006   EXAMINATION UNDER ANESTHESIA N/A 06/29/2014   Procedure: EXAM UNDER ANESTHESIA;  Surgeon: Janyth Contes, MD;  Location: [facility];  Service: Gynecology;  Laterality: N/A;   Exercise myoview  01/24/2005   FLEXIBLE SIGMOIDOSCOPY     GASTRIC BYPASS  11/09/2013   GASTRIC BYPASS     JOINT REPLACEMENT     KNEE ARTHROSCOPY  2003   right   LAPAROSCOPY N/A 03/12/2016   Procedure: LAPAROSCOPIC ANASTOMOSIS OF INTESTINE (ENTEROENTEROSTOMY);  Surgeon: Ladora Daniel, MD;  Location: [facility];  Service: General;  Laterality: N/A;   LEG SURGERY Left    metal and pins in lower left leg   mrsa Right    arm    MULTIPLE TOOTH EXTRACTIONS     PACEMAKER LEADLESS INSERTION N/A 02/08/2020   Procedure: PACEMAKER LEADLESS INSERTION;  Surgeon: Thompson Grayer, MD;  Location: [facility];  Service: Cardiovascular;  Laterality: N/A;   PARATHYROIDECTOMY Left 02/19/2021   Procedure: LEFT PARATHYROIDECTOMY WITH FROZEN SECTION;  Surgeon: Izora Gala, MD;  Location: [facility];  Service: ENT;  Laterality: Left;   SHOULDER ARTHROSCOPY W/ ROTATOR CUFF REPAIR Right    stab phlebectomy  Right 02/10/2019   stab phlebectomy > 20 incisions right leg by [name] MD    TONSILLECTOMY     TOTAL KNEE ARTHROPLASTY Left 06/30/2018   Procedure: LEFT TOTAL KNEE ARTHROPLASTY;  Surgeon: Meredith Pel, MD;  Location: [facility];  Service: Orthopedics;  Laterality: Left;   TOTAL KNEE ARTHROPLASTY Right 11/16/2019   Procedure: RIGHT TOTAL KNEE ARTHROPLASTY-CEMENTED;  Surgeon: Meredith Pel, MD;  Location: [facility];  Service: Orthopedics;  Laterality: Right;   VARICOSE VEIN SURGERY     Remotef   Patient Active Problem List   Diagnosis Date Noted   Tremor 11/05/2021   Mood changes 07/17/2021   Neck muscle spasm 03/17/2021   Type 2 DM with diabetic neuropathy affecting both sides of body ([place]) 01/09/2021   Right leg swelling 12/27/2020   Parathyroid adenoma 11/23/2020   Osteoporosis screening 11/16/2020   Memory loss 08/07/2020   Serum calcium elevated 04/12/2020   Hallux rigidus of left foot 04/12/2020   Hyperparathyroidism ([place]) 04/11/2020   Presbycusis of both ears 04/11/2020   No-show for appointment 03/08/2020   Pacemaker    Hypothyroidism 07/19/2019   Hypertension associated with diabetes ([place]) 07/11/2018   Hyperlipidemia associated with type 2 diabetes mellitus ([place]) 07/11/2018   Short bowel syndrome 07/11/2018   Status post left knee replacement 07/11/2018   Major depression in remission ([place]) 07/07/2018   Coronary artery disease involving native coronary artery of native heart without angina pectoris  07/07/2018   Paroxysmal atrial fibrillation ([place]) 05/11/2018   Age-related cognitive decline 04/30/2018   Gastroesophageal reflux disease without esophagitis 04/15/2017   B12 deficiency 12/13/2016   Tubular adenoma of colon 03/09/2016   Vitamin D deficiency 03/09/2016   S/P bariatric surgery-duodenal switch with sleeve gastrectomy 12/04/2013   Morbid obesity ([place]) 08/26/2013   Iron deficiency anemia 06/19/2012   Low back pain 03/05/2012   PSORIASIS 05/28/2010   Peripheral neuropathy 01/12/2009   Type 2 diabetes, controlled, with neuropathy ([place]) 02/18/2008   Obesity with sleep apnea 06/12/2007   ONSET DATE: 10/08/2021    REFERRING DIAG: M54.41 (ICD-10-CM) - Midline low back  pain with right-sided sciatica, unspecified chronicity    THERAPY DIAG:  Other low back pain   Other abnormalities of gait and mobility   Muscle weakness (generalized)  Right BP/HR taken onset of session.                                                                                        Today's Vitals   11/27/21 1119  BP: 140/78      SUBJECTIVE STATEMENT: Pt discusses positive findings from recent MD visit.  She further elaborates on discussion regarding caregiver related stresses and ongoing difficulties.   Pt accompanied by: self   PERTINENT HISTORY: Anemia, Anxiety, Arthritis, B12 deficiency, chronic LBP, bowel incontinence, cataract, BLE cellulitis, chronic depression, CAD w/o angina, DM2 w/ subsequent neuropathy, HTN, left frozen shoulder, GERD, multinodular goiter, hypercholestremia, hyperparathyroidism, NASH, obstructive sleep apnea, pacemaker, paroxysmal A-Fib, venous insufficiency, vit D deficiency, second degree AV block, Bilateral TKAs, psoriasis     PRECAUTIONS: Fall and ICD/Pacemaker   PAIN:   Are you having pain? No   TODAY'S TREATMENT:   GAIT: Gait pattern: trendelenburg, lateral hip instability, and lateral lean- Left Distance walked: 87' + 575' Assistive device utilized:   trekking poles Level of assistance: Complete Independence and CGA Comments: Pt ambulates with notable left compensated trendelenburg with lateral lean contributing to intermittent crossover step when using single right trekking pole during initial laps of second bout of gait.  Stability greatly improved with trekking pole on left of body during final 3 laps with improved BOS.  Pt benefits from bilateral trekking poles due to widened BOS and decreased gait speed for safety. NMR: -step taps up to third step progressing from BUE support down to single fingertip support x15 each LE -standing no UE support feet together eyes closed 2x105mn  See edu and subjective for discussion of self-care and home management.  Verbally reviewed HEP.  PATIENT EDUCATION: Education details: Discharge at next visit and to allow her time to focus on caregiver duties and stress management.  Continue HEP. Person educated: Patient; Daughter-Nicole Education method: Explanation, demonstrated, returned demonstrated, needs reinforcement  Education comprehension: verbalized understanding     HOME EXERCISE PROGRAM: Access Code: [email or web address] URL: [email or web address] Date: 10/16/2021 Prepared by: ECameron Sprang Exercises - Supine Bicycles  - 1 x daily - 7 x weekly - 4 sets - 5 reps - Supine Double Leg Lift  - 1 x daily - 7 x weekly - 4 sets - 5 reps - Supine Posterior Pelvic Tilt  - 1 x daily - 7 x weekly - 2 sets - 10 reps - Seated Table Hamstring Stretch  - 1 x daily - 7 x weekly - 1 sets - 3 reps - 30 secs hold Added 11/13/2021  - Standing Balance in Corner with Eyes Closed  - 1 x daily - 5 x weekly - 1 sets - 4 reps - 30 seconds hold - Tandem Stance with Eyes Closed in Corner  - 1 x daily - 5 x weekly - 1 sets - 4 reps - 30 seconds hold - Standing Near Stance in [place]onder[place]with Eyes Closed  - 1 x daily - 5 x  weekly - 1 sets - 4 reps - 30 seconds hold - Standing with Head Rotation  - 1 x daily - 5 x weekly - 1  sets - 4 reps - 30 seconds hold - Tandem Walking with Counter Support  - 1 x daily - 5 x weekly - 3 sets - 10 reps  - Standing March with Counter Support  - 1 x daily - 5 x weekly - 2 sets - 20 reps - Sit to Stand with Armchair  - 1 x daily - 5 x weekly - 2 sets - 10 reps   GOALS: Goals reviewed with patient? Yes   SHORT TERM GOALS: Target date:  11/09/2021   Pt will be independent with strength and balance HEP. Baseline:  Established, needs advanced balance additions. Goal status: MET   2.  Pt will decrease 5xSTS to <16 seconds w/ BUE support as needed in order to demonstrate decreased risk for falls and improved functional bilateral LE strength and power. Baseline: 18.50 sec BUE support; 14.71 seconds w/ BUE support Goal status: MET   3.  Pt will ambulate>400 feet on 2MWT to demonstrate improved endurance for functional tasks in home and community. Baseline: 315'; 294' Goal status: NOT MET   4.  Pt will demonstrate a gait speed of >3 feet/sec in order to decrease risk for falls. Baseline: 2.87 ft/sec; 3.24 ft/sec no AD Goal status: MET   5.  FGA to be assessed with appropriate LTG set. Baseline: Assessed w/ LTG set. Goal status: MET   LONG TERM GOALS: Target date:  12/07/2021   Pt will be independent with advanced strength and balance HEP and walking program. Baseline: to be established Goal status: INITIAL   2.  Pt will decrease 5xSTS to <13 seconds w/o BUE support in order to demonstrate decreased risk for falls and improved functional bilateral LE strength and power. Baseline: 18.50 sec BUE support Goal status: INITIAL   3.  Pt will ambulate >/=750 feet with LRAD and independent level of assist w/o complaint of LBP to promote household and community access. Baseline: 315' supervision no AD Goal status: INITIAL   4.  Pt will demonstrate a gait speed of >3.4 feet/sec in order to decrease risk for falls. Baseline: 2.87 ft/sec; 3.24 ft/sec 11/13/2021 Goal status: REVISED    5.  Pt will improve FGA score to 20/30 in order to demonstrate improved balance and decreased fall risk. Baseline: 14/30 10/22/2021 Goal status: INITIAL  6.  Pt will report decreased pain when providing care to dependent husband at home. Baseline: frequent discomfort that she associates to caregiving tasks  Goal status:  INITIAL   ASSESSMENT:   CLINICAL IMPRESSION: Assessed gait with use of trekking poles today with pt demonstrating improved stability and endurance.  Progressed balance in SLS and with narrowed BOS today.  Pt has an updated HEP in preparation for discharge next visit which following discussion today pt is in agreement with.   OBJECTIVE IMPAIRMENTS Abnormal gait, decreased activity tolerance, decreased balance, decreased coordination, decreased endurance, decreased knowledge of use of DME, decreased strength, and postural dysfunction.    ACTIVITY LIMITATIONS community activity, yard work, and caring for w/c bound husband .    PERSONAL FACTORS Age, Behavior pattern, Education, Fitness, Past/current experiences, and Social background are also affecting patient's functional outcome.      REHAB POTENTIAL: Fair See personal factors and PMH   CLINICAL DECISION MAKING: Stable/uncomplicated   EVALUATION COMPLEXITY: Low   PLAN: PT FREQUENCY: 1x/week   PT  DURATION: 8 weeks   PLANNED INTERVENTIONS: Therapeutic exercises, Therapeutic activity, Neuromuscular re-education, Balance training, Gait training, Patient/Family education, Joint mobilization, Stair training, Vestibular training, DME instructions, Spinal mobilization, Cryotherapy, Moist heat, Biofeedback, and Manual therapy   PLAN FOR NEXT SESSION: Assess LTGs-D/C 6/13!  Trial trekking poles on various surfaces as pt uses these on her path at home when walking.  Advance HEP prn. Balance training with narrow BOS, head turns, eyes closed, hurdles, SLS-step taps/up and down/cones, gait training no AD on multi-level surfaces     Elease Etienne, PT, DPT 11/27/21, 11:48 AM

## 2021-11-30 DIAGNOSIS — I872 Venous insufficiency (chronic) (peripheral): Secondary | ICD-10-CM | POA: Diagnosis not present

## 2021-11-30 DIAGNOSIS — R600 Localized edema: Secondary | ICD-10-CM | POA: Diagnosis not present

## 2021-12-04 ENCOUNTER — Ambulatory Visit: Payer: Medicare Other | Admitting: Physical Therapy

## 2021-12-04 ENCOUNTER — Encounter: Payer: Self-pay | Admitting: Physical Therapy

## 2021-12-04 VITALS — BP 124/64 | HR 61

## 2021-12-04 DIAGNOSIS — R2689 Other abnormalities of gait and mobility: Secondary | ICD-10-CM

## 2021-12-04 DIAGNOSIS — M6281 Muscle weakness (generalized): Secondary | ICD-10-CM

## 2021-12-04 DIAGNOSIS — M5459 Other low back pain: Secondary | ICD-10-CM | POA: Diagnosis not present

## 2021-12-04 NOTE — Therapy (Signed)
OUTPATIENT PHYSICAL THERAPY TREATMENT NOTE   Patient Name: [name] MRN: [id number] DOB:07-Feb-1956, 66 y.o., female Today's Date: 12/04/2021  PCP: Donney Dice, DO REFERRING PROVIDER: Donney Dice, DO  PHYSICAL THERAPY DISCHARGE SUMMARY  Visits from Start of Care: 8  Current functional level related to goals / functional outcomes: See clinical impression statement.   Remaining deficits: Bilateral lean during gait- recommended trekking poles when walking long or unlevel distances   Education / Equipment: Discharge plan, continue walking and HEP.   Patient agrees to discharge. Patient goals were partially met. Patient is being discharged due to being pleased with the current functional level.  END OF SESSION:   PT End of Session - 12/04/21 1110     Visit Number 8    Number of Visits 9   8 + eval   Date for PT Re-Evaluation 12/21/21    PT Start Time 1107    PT Stop Time 8119    PT Time Calculation (min) 38 min    Equipment Utilized During Treatment Gait belt    Activity Tolerance Patient tolerated treatment well    Behavior During Therapy WFL for tasks assessed/performed             Past Medical History:  Diagnosis Date   Advanced directives, counseling/discussion 04/12/2020   Allergy    Anemia    ANEMIA, PERNICIOUS, HX OF 05/13/2007   Anxiety    Arthritis    ASYMPTOMATIC POSTMENOPAUSAL STATUS 02/18/2008   B12 deficiency 12/13/2016   BACK PAIN, LUMBAR 11/19/2007   Blood in urine 07/19/2019   Bowel incontinence    Cataract    Cellulitis of left lower leg 10/28/2013   Cellulitis of leg, right 10/11/2010   Chronic depression 10/06/2007   Chronic dermatitis of hands 05/02/2009   Coronary artery disease involving native coronary artery of native heart without angina pectoris 07/07/2018   Last Assessment & Plan:  Formatting of this note might be different from the original. The patient reports remote cardiac catheterization with up to 40% plaque.  Subsequent  Cardiolite stress test in October 2019 did not show any evidence of active ischemia or prior infarction. Formatting of this note might be different from the original. medical  Last Assessment & Plan:  Formatting of this note mi   DEPRESSION, CHRONIC 10/06/2007   Diabetes mellitus without complication ([place])    DIABETES MELLITUS, WITH NEUROLOGICAL COMPLICATIONS 1/47/8295   Diabetic neuropathy ([place])    DYSLIPIDEMIA 05/13/2007   Dyslipidemia 04/15/2017   Last Assessment & Plan:  The patient will continue atorvastatin for her dyslipidemia.   Dyslipidemia associated with type 2 diabetes mellitus ([place]) 07/11/2018   Essential hypertension 03/20/2010   Family history of colon cancer    Fecal incontinence 08/02/2015   Frozen shoulder    Lt   Full dentures    Gastroesophageal reflux disease without esophagitis 04/15/2017   Generalized abdominal pain 02/05/2018   Genetic testing 11/24/2017   Negative genetic testing on the common hereditary cancer panel.  The Hereditary Gene Panel offered by [facility] includes sequencing and/or deletion duplication testing of the following 47 genes: APC, ATM, AXIN2, BARD1, BMPR1A, BRCA1, BRCA2, BRIP1, CDH1, CDK4, CDKN2A (p14ARF), CDKN2A (p16INK4a), CHEK2, CTNNA1, DICER1, EPCAM (Deletion/duplication testing only), GREM1 (promoter region deletion/duplicat   GERD (gastroesophageal reflux disease)    GI bleed 03/15/2016   GOITER, MULTINODULAR 05/13/2007   GOITER, MULTINODULAR 05/13/2007    (last TSH 3.26; US soft tissue neck in 09/2004 showed multinodular goiter with specific small nodule for which  was recommended to be followed by repeat US in 3-6 months 12/2016: Thyroid US: recommend repeat in 1 year (one nodule 1.7cm on left thyroid)    Headache    History of blood transfusion    Hx of colonic polyps 12/04/2010   HYPERCHOLESTEROLEMIA 03/20/2010   Hyperparathyroidism ([place]) 04/11/2020   HYPERTENSION 03/20/2010   Hypertension associated with diabetes ([place]) 07/11/2018   Hypothyroidism  07/19/2019   Iron deficiency anemia 06/19/2012   Low back pain 03/05/2012   Completed PT-notes say very motivated and had good progress.   CT (03/2020: Mild curvature convex to the right in the upper lumbar region into left in lower lumbar region. No antero or retrolisthesis in the supine position. retrolisthesis at L3-4 of 3 mm with flexion which increases to 6 mm with neutral and extension. Multifactorial spinal stenosis at L3-4 due to circumferential protrusion of the    Lower back pain    Major depression in remission ([place]) 07/07/2018   Last Assessment & Plan:  Mood has seemed good on sertraline.   Malabsorption 03/12/2016   NASH (nonalcoholic steatohepatitis)    Nausea 07/19/2019   Need for immunization against influenza 04/12/2020   No-show for appointment 03/08/2020   Nonobstructive CAD  08/26/2013   The patient has prior cardiac workup in 2006 when she underwent a stress test for abnormal EKG. The stress test was nonconclusive and she underwent cardiac catheterization that showed 30% stenosis in the LAD in 25% stenosis in RCA she was treated medically.     OA (osteoarthritis)    OBSTRUCTIVE SLEEP APNEA 06/12/2007   uses CPAP.   Pacemaker    Paroxysmal atrial fibrillation ([place]) 05/11/2018   Last Assessment & Plan:  Patient was noted to have episodes of paroxysmal atrial fibrillation that was predominantly rate controlled and at a relatively low burden.  She is chronically on anticoagulation now with Eliquis.  She does not take any negative chronotropic agents with her sinus bradycardia.  If she has any breakthrough more prolonged tachycardic episodes and requires negative chronotropi   Peripheral autonomic neuropathy due to diabetes mellitus ([place]) 07/11/2018   PERIPHERAL NEUROPATHY 01/12/2009   Peripheral neuropathy 01/12/2009   Peripheral vascular disease ([place])    Presbycusis of both ears 04/11/2020   Primary osteoarthritis involving multiple joints 07/07/2018   Last Assessment & Plan:  Post op  left tka and here for rehab as her husband is handicapped so she has limited help at home. Consitpation noted, some pain overnight, better with a pillow under her knee.   Primary osteoarthritis of left knee 04/23/2011   Previously seen by Dr. Alphonzo Severance. Will plan on repeat referral after trial injection.      PSORIASIS 05/28/2010   Rectal prolapse 05/24/2015   Rectovaginal fistula 05/25/2014   Renal insufficiency    stage 1 kd   S/P bariatric surgery-duodenal switch with sleeve gastrectomy 12/04/2013   S/P total knee arthroplasty, right 11/16/2019   Screening mammogram, encounter for 07/12/2019   Second degree AV block 02/08/2020   Serum calcium elevated 04/12/2020   Short bowel syndrome 07/11/2018   Spigelian hernia 02/05/2018   Status post left knee replacement 07/11/2018   Stress 05/24/2015   Surgical counseling visit 10/02/2019   Tubular adenoma of colon 03/09/2016   Colonoscopy in 10/2015: 3  tubular adenomas; GI recommends repeat colonoscopy in 3 years Colonoscopy August 2012: 3 tubular adenomas.    Type 2 diabetes, controlled, with neuropathy ([place]) 12/26/8887   Umbilical hernia without obstruction and without gangrene 04/15/2017  Unilateral primary osteoarthritis, left knee    UNSPECIFIED VENOUS INSUFFICIENCY 05/28/2010   Patient with frequent ulceration related to venous stasis-seen at wound care. Edema and Venous stasis changes due to this . Echo 04/16/11 with EF 60%. No signs diastolic dysfunction.        Vaccination against Streptococcus pneumoniae within past 5 years 04/12/2020   Vaginal irritation 07/19/2019   Venous stasis dermatitis of right lower extremity 01/02/2018   Vitamin D deficiency 03/09/2016   Wears glasses    Past Surgical History:  Procedure Laterality Date   CATARACT EXTRACTION W/ INTRAOCULAR LENS  IMPLANT, BILATERAL     COLONOSCOPY     ELECTROCARDIOGRAM  04/16/2006   EXAMINATION UNDER ANESTHESIA N/A 06/29/2014   Procedure: EXAM UNDER ANESTHESIA;  Surgeon: Janyth Contes, MD;  Location: [facility];  Service: Gynecology;  Laterality: N/A;   Exercise myoview  01/24/2005   FLEXIBLE SIGMOIDOSCOPY     GASTRIC BYPASS  11/09/2013   GASTRIC BYPASS     JOINT REPLACEMENT     KNEE ARTHROSCOPY  2003   right   LAPAROSCOPY N/A 03/12/2016   Procedure: LAPAROSCOPIC ANASTOMOSIS OF INTESTINE (ENTEROENTEROSTOMY);  Surgeon: Ladora Daniel, MD;  Location: [facility];  Service: General;  Laterality: N/A;   LEG SURGERY Left    metal and pins in lower left leg   mrsa Right    arm   MULTIPLE TOOTH EXTRACTIONS     PACEMAKER LEADLESS INSERTION N/A 02/08/2020   Procedure: PACEMAKER LEADLESS INSERTION;  Surgeon: Thompson Grayer, MD;  Location: [facility];  Service: Cardiovascular;  Laterality: N/A;   PARATHYROIDECTOMY Left 02/19/2021   Procedure: LEFT PARATHYROIDECTOMY WITH FROZEN SECTION;  Surgeon: Izora Gala, MD;  Location: [facility];  Service: ENT;  Laterality: Left;   SHOULDER ARTHROSCOPY W/ ROTATOR CUFF REPAIR Right    stab phlebectomy  Right 02/10/2019   stab phlebectomy > 20 incisions right leg by [name] MD    TONSILLECTOMY     TOTAL KNEE ARTHROPLASTY Left 06/30/2018   Procedure: LEFT TOTAL KNEE ARTHROPLASTY;  Surgeon: Meredith Pel, MD;  Location: [facility];  Service: Orthopedics;  Laterality: Left;   TOTAL KNEE ARTHROPLASTY Right 11/16/2019   Procedure: RIGHT TOTAL KNEE ARTHROPLASTY-CEMENTED;  Surgeon: Meredith Pel, MD;  Location: [facility];  Service: Orthopedics;  Laterality: Right;   VARICOSE VEIN SURGERY     Remotef   Patient Active Problem List   Diagnosis Date Noted   Tremor 11/05/2021   Mood changes 07/17/2021   Neck muscle spasm 03/17/2021   Type 2 DM with diabetic neuropathy affecting both sides of body ([place]) 01/09/2021   Right leg swelling 12/27/2020   Parathyroid adenoma 11/23/2020   Osteoporosis screening 11/16/2020   Memory loss 08/07/2020   Serum calcium elevated 04/12/2020   Hallux rigidus of left foot 04/12/2020    Hyperparathyroidism ([place]) 04/11/2020   Presbycusis of both ears 04/11/2020   No-show for appointment 03/08/2020   Pacemaker    Hypothyroidism 07/19/2019   Hypertension associated with diabetes ([place]) 07/11/2018   Hyperlipidemia associated with type 2 diabetes mellitus ([place]) 07/11/2018   Short bowel syndrome 07/11/2018   Status post left knee replacement 07/11/2018   Major depression in remission ([place]) 07/07/2018   Coronary artery disease involving native coronary artery of native heart without angina pectoris 07/07/2018   Paroxysmal atrial fibrillation ([place]) 05/11/2018   Age-related cognitive decline 04/30/2018   Gastroesophageal reflux disease without esophagitis 04/15/2017   B12 deficiency 12/13/2016   Tubular adenoma of colon 03/09/2016  Vitamin D deficiency 03/09/2016   S/P bariatric surgery-duodenal switch with sleeve gastrectomy 12/04/2013   Morbid obesity ([place]) 08/26/2013   Iron deficiency anemia 06/19/2012   Low back pain 03/05/2012   PSORIASIS 05/28/2010   Peripheral neuropathy 01/12/2009   Type 2 diabetes, controlled, with neuropathy ([place]) 02/18/2008   Obesity with sleep apnea 06/12/2007   ONSET DATE: 10/08/2021    REFERRING DIAG: M54.41 (ICD-10-CM) - Midline low back pain with right-sided sciatica, unspecified chronicity    THERAPY DIAG:  Other low back pain   Other abnormalities of gait and mobility   Muscle weakness (generalized)  SUBJECTIVE STATEMENT: Pt states she was throwing up and having diarrhea this morning, but wanted to come to be re-assessed for discharge.  She states she is walking regularly with husband joining in his 84.   Pt accompanied by: self   PERTINENT HISTORY: Anemia, Anxiety, Arthritis, B12 deficiency, chronic LBP, bowel incontinence, cataract, BLE cellulitis, chronic depression, CAD w/o angina, DM2 w/ subsequent neuropathy, HTN, left frozen shoulder, GERD, multinodular goiter, hypercholestremia, hyperparathyroidism, NASH, obstructive  sleep apnea, pacemaker, paroxysmal A-Fib, venous insufficiency, vit D deficiency, second degree AV block, Bilateral TKAs, psoriasis     PRECAUTIONS: Fall and ICD/Pacemaker   PAIN:   Are you having pain? No  VITALS (RUE in sitting following 5xSTS): Today's Vitals   12/04/21 1139  BP: 124/64  Pulse: 61   TODAY'S TREATMENT:  THERACT:  GAIT: Gait pattern: step through pattern, lateral lean- Right, lateral lean- Left, and narrow BOS Distance walked: 460' Assistive device utilized: None Level of assistance: Complete Independence Comments: Pt ambulates with mild increase in lateral lean bilaterally with progressed gait distance.  No overt LOB or crossover step noted.  STAIRS:  Level of Assistance: Complete Independence  Stair Negotiation Technique: Alternating Pattern  with Bilateral Rails  Number of Stairs: 4   Height of Stairs: 6"  Comments:  No cuing.    [facility] PT Assessment - 12/04/21 1114       Functional Gait  Assessment   Gait assessed  Yes    Gait Level Surface Walks 20 ft in less than 7 sec but greater than 5.5 sec, uses assistive device, slower speed, mild gait deviations, or deviates 6-10 in outside of the 12 in walkway width.    Change in Gait Speed Able to smoothly change walking speed without loss of balance or gait deviation. Deviate no more than 6 in outside of the 12 in walkway width.    Gait with Horizontal Head Turns Performs head turns smoothly with slight change in gait velocity (eg, minor disruption to smooth gait path), deviates 6-10 in outside 12 in walkway width, or uses an assistive device.    Gait with Vertical Head Turns Performs head turns with no change in gait. Deviates no more than 6 in outside 12 in walkway width.    Gait and Pivot Turn Pivot turns safely within 3 sec and stops quickly with no loss of balance.    Step Over Obstacle Is able to step over one shoe box (4.5 in total height) without changing gait speed. No evidence of imbalance.    Gait  with Narrow Base of Support Ambulates less than 4 steps heel to toe or cannot perform without assistance.    Gait with Eyes Closed Walks 20 ft, uses assistive device, slower speed, mild gait deviations, deviates 6-10 in outside 12 in walkway width. Ambulates 20 ft in less than 9 sec but greater than 7 sec.    Ambulating  Backwards Walks 20 ft, uses assistive device, slower speed, mild gait deviations, deviates 6-10 in outside 12 in walkway width.    Steps Alternating feet, must use rail.    Total Score 21    FGA comment: medium fall risk            Assessed 5xSTS:  12.40 sec w/ BUE support from standard chair Pt briefly light-headed following assessment so BP assessed.  Vitals as seen above. 10MWT w/o AD:  7.61 sec = 1.31 m/sec OR 4.34 ft/sec  PATIENT EDUCATION: Education details: Discharge plan.  Continue walking and HEP. Person educated: Patient Education method: Explanation, demonstrated, returned demonstrated, needs reinforcement  Education comprehension: verbalized understanding     HOME EXERCISE PROGRAM: Access Code: [email or web address] URL: [email or web address] Date: 10/16/2021 Prepared by: Cameron Sprang  Exercises - Supine Bicycles  - 1 x daily - 7 x weekly - 4 sets - 5 reps - Supine Double Leg Lift  - 1 x daily - 7 x weekly - 4 sets - 5 reps - Supine Posterior Pelvic Tilt  - 1 x daily - 7 x weekly - 2 sets - 10 reps - Seated Table Hamstring Stretch  - 1 x daily - 7 x weekly - 1 sets - 3 reps - 30 secs hold Added 11/13/2021  - Standing Balance in Corner with Eyes Closed  - 1 x daily - 5 x weekly - 1 sets - 4 reps - 30 seconds hold - Tandem Stance with Eyes Closed in Corner  - 1 x daily - 5 x weekly - 1 sets - 4 reps - 30 seconds hold - Standing Near Stance in [place] with Eyes Closed  - 1 x daily - 5 x weekly - 1 sets - 4 reps - 30 seconds hold - Standing with Head Rotation  - 1 x daily - 5 x weekly - 1 sets - 4 reps - 30 seconds hold - Tandem Walking with Counter  Support  - 1 x daily - 5 x weekly - 3 sets - 10 reps  - Standing March with Counter Support  - 1 x daily - 5 x weekly - 2 sets - 20 reps - Sit to Stand with Armchair  - 1 x daily - 5 x weekly - 2 sets - 10 reps   GOALS: Goals reviewed with patient? Yes   SHORT TERM GOALS: Target date:  11/09/2021   Pt will be independent with strength and balance HEP. Baseline:  Established, needs advanced balance additions. Goal status: MET   2.  Pt will decrease 5xSTS to <16 seconds w/ BUE support as needed in order to demonstrate decreased risk for falls and improved functional bilateral LE strength and power. Baseline: 18.50 sec BUE support; 14.71 seconds w/ BUE support Goal status: MET   3.  Pt will ambulate>400 feet on 2MWT to demonstrate improved endurance for functional tasks in home and community. Baseline: 315'; 294' Goal status: NOT MET   4.  Pt will demonstrate a gait speed of >3 feet/sec in order to decrease risk for falls. Baseline: 2.87 ft/sec; 3.24 ft/sec no AD Goal status: MET   5.  FGA to be assessed with appropriate LTG set. Baseline: Assessed w/ LTG set. Goal status: MET   LONG TERM GOALS: Target date:  12/07/2021   Pt will be independent with advanced strength and balance HEP and walking program. Baseline: Modified last visit, pt compliant. Goal status: MET   2.  Pt will decrease 5xSTS to <13 seconds w/o  BUE support in order to demonstrate decreased risk for falls and improved functional bilateral LE strength and power. Baseline: 18.50 sec BUE support; 12.40 sec w/ BUE support Goal status: PARTIALLY MET   3.  Pt will ambulate >/=750 feet with LRAD and independent level of assist w/o complaint of LBP to promote household and community access. Baseline: 315' supervision no AD; 460' independent no AD Goal status: PARTIALLY MET   4.  Pt will demonstrate a gait speed of >3.4 feet/sec in order to decrease risk for falls. Baseline: 2.87 ft/sec; 3.24 ft/sec 11/13/2021; 4.34  ft/sec no AD Goal status: MET   5.  Pt will improve FGA score to 20/30 in order to demonstrate improved balance and decreased fall risk. Baseline: 14/30 10/22/2021; 21/30 12/04/2021 Goal status: MET  6.  Pt will report decreased pain when providing care to dependent husband at home. Baseline: frequent discomfort that she associates to caregiving tasks; she reports improved pain when helping husband and has been using draw sheets with handles that daughter purchased.  Goal status:  MET   ASSESSMENT:   CLINICAL IMPRESSION: Assessed LTGs in preparation to discharge today.  Pt has returned to walking regularly with trekking poles and is intermittently performing HEP.  Pt improved 5xSTS to <13 seconds but continues to require UE support to rise to standing.  When ambulating w/o AD she fatigues at 16' today noted by increased bilateral trunk lean during stance.  Her gait speed increased to 4.34 ft/sec at a leisurely pace without LOB and her FGA score is now 21/30 demonstrating a decreased fall risk compared to eval.  Overall she reports she is focusing on improving her self-care at home and is experiencing decreased pain during caregiver tasks.  At this time pt is appropriate to discharge and is in agreement with plan.   OBJECTIVE IMPAIRMENTS Abnormal gait, decreased activity tolerance, decreased balance, decreased coordination, decreased endurance, decreased knowledge of use of DME, decreased strength, and postural dysfunction.    ACTIVITY LIMITATIONS community activity, yard work, and caring for w/c bound husband .    PERSONAL FACTORS Age, Behavior pattern, Education, Fitness, Past/current experiences, and Social background are also affecting patient's functional outcome.      REHAB POTENTIAL: Fair See personal factors and PMH   CLINICAL DECISION MAKING: Stable/uncomplicated   EVALUATION COMPLEXITY: Low   PLAN: PT FREQUENCY: 1x/week   PT DURATION: 8 weeks   PLANNED INTERVENTIONS:  Therapeutic exercises, Therapeutic activity, Neuromuscular re-education, Balance training, Gait training, Patient/Family education, Joint mobilization, Stair training, Vestibular training, DME instructions, Spinal mobilization, Cryotherapy, Moist heat, Biofeedback, and Manual therapy   PLAN FOR NEXT SESSION: N/A    Elease Etienne, PT, DPT 12/04/21, 11:45 AM

## 2021-12-24 ENCOUNTER — Ambulatory Visit (INDEPENDENT_AMBULATORY_CARE_PROVIDER_SITE_OTHER): Payer: Medicare Other | Admitting: Pharmacist

## 2021-12-24 ENCOUNTER — Encounter: Payer: Self-pay | Admitting: Pharmacist

## 2021-12-24 DIAGNOSIS — E1169 Type 2 diabetes mellitus with other specified complication: Secondary | ICD-10-CM

## 2021-12-24 DIAGNOSIS — E785 Hyperlipidemia, unspecified: Secondary | ICD-10-CM | POA: Diagnosis not present

## 2021-12-24 DIAGNOSIS — E538 Deficiency of other specified B group vitamins: Secondary | ICD-10-CM | POA: Diagnosis not present

## 2021-12-24 DIAGNOSIS — E1142 Type 2 diabetes mellitus with diabetic polyneuropathy: Secondary | ICD-10-CM | POA: Diagnosis not present

## 2021-12-24 DIAGNOSIS — G6289 Other specified polyneuropathies: Secondary | ICD-10-CM

## 2021-12-24 MED ORDER — MULTIVITAMIN ADULT PO CHEW
CHEWABLE_TABLET | ORAL | 0 refills | Status: DC
Start: 2021-12-24 — End: 2023-09-04

## 2021-12-24 MED ORDER — GABAPENTIN 100 MG PO CAPS: 200.0000 mg | ORAL_CAPSULE | Freq: Two times a day (BID) | ORAL | 3 refills | Status: DC

## 2021-12-24 NOTE — Patient Instructions (Addendum)
Nice to see you today.   Continue on your Ozempic (semaglutide) 24m once weekly. STOP Metformin XR now Follow your blood sugars and let u[place]now if you are concerned that your blood sugars are elevated.   Please start a chewable multivitamin ONE daily to help with appropriate micronutrients following your gastric bypass.   Continue to work on your exercise and diet plan.   Next visit with Dr. GLarae Grooms   Plan to follow-up with Pharmacy clinic in August or September.

## 2021-12-24 NOTE — Progress Notes (Signed)
S:     Chief Complaint  Patient presents with   Medication Management    Diabetes - Weight Follow-up   [name] is a 66 y.o. female who presents for diabetes evaluation, education, and management.  PMH is significant for obesity, gastric bypass and chronic diarrhea.  Patient was referred and last seen by Primary Care Provider, Dr. Larae Grooms, on 11/23/2021.  At pharmacist last visit, patient was restarted on Ozempic with support of MAP.   Today, patient arrives in good spirits and presents without any assistance.    Family/Social History: remains complicated due to the patient providing care support for her disabled husband.   Current diabetes medications include: Ozempic (semaglutide) 60m weekly on Sunday and Metformin XR 5028mtaken BID which patient reports also intermittently leads to vomiting due to pill size.  Current hypertension medications include: amlodipine, lisinopril, HCTZ Current hyperlipidemia medications include: atorvastatin  Patient reports adherence to taking all medications as prescribed.   Do you feel that your medications are working for you? yes Do you have any problems obtaining medications due to transportation or finances? no Insurance coverage: Medicare  Patient denies hypoglycemic events.  Reported home fasting blood sugars: 90-140 (mostly low 100s)    Patient reports neuropathy (nerve pain) which is slightly improved with increased gabapentin dosing.    Patient reported dietary habits: Eats multiple meals/day and denies any skipping meals due to loss of appetite.  She notes lower intake of food and feeling full earlier.   Patient-reported exercise habits: Walking with walking sticks.   O:   Review of Systems  Gastrointestinal:  Positive for diarrhea.    Physical Exam Constitutional:      Appearance: Normal appearance.  Pulmonary:     Effort: Pulmonary effort is normal.  Neurological:     Mental Status: She is alert.  Psychiatric:         Mood and Affect: Mood normal.        Behavior: Behavior normal.        Thought Content: Thought content normal.     Lab Results  Component Value Date   HGBA1C 6.9 11/23/2021   Vitals:   12/24/21 1033 12/24/21 1104  BP: (!) 142/79 123/65  Pulse: 70   SpO2: 99%     Lipid Panel     Component Value Date/Time   CHOL 185 11/16/2020 1216   CHOL 189 08/28/2016 0000   TRIG 84 11/16/2020 1216   TRIG 63 08/28/2016 0000   TRIG 184 (H) 04/09/2006 0904   HDL 58 11/16/2020 1216   HDL 75 08/28/2016 0000   CHOLHDL 3.2 11/16/2020 1216   CHOLHDL 2.52 08/28/2016 0000   CHOLHDL 4.5 06/19/2012 1125   VLDL 61 (H) 06/19/2012 1125   LDLCALC 112 (H) 11/16/2020 1216   LDLDIRECT 113 (H) 07/05/2013 1512    Clinical Atherosclerotic Cardiovascular Disease (ASCVD): Yes  The 10-year ASCVD risk score (Arnett DK, et al., 2019) is: 15.1%   Values used to calculate the score:     Age: [age]     Sex: Female     Is Non-Hispanic African American: No     Diabetic: Yes     Tobacco smoker: No     Systolic Blood Pressure: 12923mHg     Is BP treated: Yes     HDL Cholesterol: 58 mg/dL     Total Cholesterol: 185 mg/dL    A/P: Diabetes longstanding with history of gastric bypass and currently doing very well with use  of Ozempic (semaglutide). Patient is able to verbalize appropriate hypoglycemia management plan. Medication adherence appears good. Control is suboptimal due to diarrhea potentially caused by Metformin which is also causing pill induced vomiting intermittently. -Continued Ozempic (semaglutide) at 11m weekly on Sunday.  -Discontinued metformin 5032mXR BID due to history of diarrhea, B-12 deficiency and pill induced vomiting issues in this patient who has good control and has lost weight over the last 2 months.    B-12 deficiency history - possibly related to short-gut syndrome, post-gastric by-pass or potentially metformin.  Borderline B-12 level measured ~ 1 month ago. - Start  Multivitamin daily - STOP metformin - Recheck B-12 in future lab draw  Neuropathy - stable,  Continue gabapentin at current doses.  New prescription provided with updated dosing.   Hyperlipidemia - Consider dose increase of atorvastatin from 40 to 8074mt next PCP visit.  Repeat lipid panel at next lab assessment.   -Patient educated on purpose, proper use, and potential adverse effects. -Extensively discussed pathophysiology of diabetes, recommended lifestyle interventions, dietary effects on blood sugar control.  -Counseled on s/sx of and management of hypoglycemia.   Hypertension longstanding currently at goal. Blood pressure goal of <130/80 mmHg. Medication adherence good. - no change in treatment at this time.   Written patient instructions provided. Patient verbalized understanding of treatment plan.  Total time in face to face counseling 47 minutes.    Follow-up:  Pharmacist following PCP visit. PCP clinic visit next, within 1-2 months. .Marland Kitchen

## 2021-12-24 NOTE — Assessment & Plan Note (Signed)
Diabetes longstanding with history of gastric bypass and currently doing very well with use of Ozempic (semaglutide). Patient is able to verbalize appropriate hypoglycemia management plan. Medication adherence appears good. Control is suboptimal due to diarrhea potentially caused by Metformin which is also causing pill induced vomiting intermittently. -Continued Ozempic (semaglutide) at 71m weekly on Sunday.  -Discontinued metformin 5033mXR BID due to history of diarrhea, B-12 deficiency and pill induced vomiting issues in this patient who has good control and has lost weight over the last 2 months.   Neuropathy - stable,  Continue gabapentin at current doses.  New prescription provided with updated dosing.

## 2021-12-24 NOTE — Assessment & Plan Note (Signed)
B-12 deficiency history - possibly related to short-gut syndrome, post-gastric by-pass or potentially metformin.  Borderline B-12 level measured ~ 1 month ago. - Start Multivitamin daily - STOP metformin - Recheck B-12 in future lab draw

## 2021-12-24 NOTE — Progress Notes (Signed)
Reviewed: I agree with Dr. Koval's documentation and management. 

## 2021-12-24 NOTE — Assessment & Plan Note (Signed)
Hyperlipidemia in patient with ASCVD - Consider dose increase of atorvastatin from 40 to 56m at next PCP visit.  Repeat lipid panel at next lab assessment.

## 2021-12-24 NOTE — Assessment & Plan Note (Signed)
>>  ASSESSMENT AND PLAN FOR TYPE 2 DM WITH DIABETIC NEUROPATHY AFFECTING BOTH SIDES OF BODY (HCC) WRITTEN ON 12/24/2021 11:42 AM BY KOVAL, PETER G, RPH-CPP  Diabetes longstanding with history of gastric bypass and currently doing very well with use of Ozempic  (semaglutide ). Patient is able to verbalize appropriate hypoglycemia management plan. Medication adherence appears good. Control is suboptimal due to diarrhea potentially caused by Metformin  which is also causing pill induced vomiting intermittently. -Continued Ozempic  (semaglutide ) at 1mg  weekly on Sunday.  -Discontinued metformin  500mg  XR BID due to history of diarrhea, B-12 deficiency and pill induced vomiting issues in this patient who has good control and has lost weight over the last 2 months.   Neuropathy - stable,  Continue gabapentin  at current doses.  New prescription provided with updated dosing.

## 2022-01-08 ENCOUNTER — Telehealth: Payer: Self-pay

## 2022-01-08 MED ORDER — SEMAGLUTIDE (2 MG/DOSE) 8 MG/3ML ~~LOC~~ SOPN: PEN_INJECTOR | SUBCUTANEOUS | 11 refills | Status: DC

## 2022-01-08 NOTE — Addendum Note (Signed)
Addended by: Leavy Cella on: 01/08/2022 11:19 AM   Modules accepted: Orders

## 2022-01-08 NOTE — Telephone Encounter (Signed)
Pt called to f/u on ozempic shipment. Says she has one more shot left for this Sunday. Informed her she picked up 4 boxes 09/2021 and should have enough until august. Shes unsure what happened.  Informed pt I faxed her 73m refills on 12/11/21 and shipment should be in any day now.  Pt also wanting to talk to Dr KValentina Lucksabout titrating to 231mdose. Says they discussed it at her last appt. Told pt I would relay the message to him.

## 2022-01-08 NOTE — Telephone Encounter (Signed)
Returned call to patient to discuss Ozempic use and dosing.   Patient reports continued use with good tolerability of Ozempic.  She was interested in increased dose of 34m which I agreed would be the plan with our next prescription/Med assistance request and supply.  I shared that I would communicate with CHortencia Pilarto request higher dose.   She will continue on 161mweekly until we get clarification on timing of availability of new dose 89m80mnce weekly.   Patient verbalized understanding of treatment plan.   Next follow-up with Rx clinic planned for late August.  Anticipate increasing dose from 1mg30m 89mg 3mor to or at that visit depending on supply of Ozempic.

## 2022-01-08 NOTE — Telephone Encounter (Signed)
Noted and agree. 

## 2022-01-15 NOTE — Telephone Encounter (Signed)
Left message with pt regarding medication (ozempic) shipment. Medication refills have to be refaxed due to "page being cut off" (per novo nordisk). Medication should deliver to office in up to 14 business days. Also informed pt I will place the order for her dose increase as well.

## 2022-01-25 ENCOUNTER — Telehealth: Payer: Self-pay | Admitting: Pharmacist

## 2022-01-25 NOTE — Telephone Encounter (Signed)
Patient called with inquiry RE supply of Ozempic 49m weekly dose.   She has missed 1 week and wanted to inquire if any of the new dose had arrived.   I shared that I had not seen any delivery with her name this week.  I agreed to check the refrigerator to make sure we had not missed any delivery.   She is aware that CRosendo Grosis currently out of the office.  I informed her we would contact her upon arrival - hopefully soon.   She was appreciative of the follow-up.   [facility] to determine if we have an expected delivery date for this agent.

## 2022-02-05 ENCOUNTER — Other Ambulatory Visit (HOSPITAL_COMMUNITY): Payer: Self-pay

## 2022-02-05 MED ORDER — SEMAGLUTIDE (2 MG/DOSE) 8 MG/3ML ~~LOC~~ SOPN: 2.0000 mg | PEN_INJECTOR | SUBCUTANEOUS | 3 refills | Status: DC

## 2022-02-05 NOTE — Telephone Encounter (Signed)
Patient new prescription for Ozempic 16m sent to pharmacy.   Contacted patient to clarify plan.  She verabalized understanding and was appreciative of the call.

## 2022-02-05 NOTE — Telephone Encounter (Signed)
Called patient after speaking with [employer] Company had originally began to ship the 74m pen around the end of July until they rec'd the dose increase for 258m They stopped the original shipment and never processed the increase (couldnt explain why). 66m96mhipment is now in processing and should ship to office in 10-14 business days.   Ran test claim for patient to see how much this would cost her at the pharmacy (since we have no samples). Copay is $50 for one box. Since pt has missed 3 doses, she would like [facility] [address] [facility] in meantime.

## 2022-02-05 NOTE — Addendum Note (Signed)
Addended by: Leavy Cella on: 02/05/2022 10:38 AM   Modules accepted: Orders

## 2022-02-07 ENCOUNTER — Ambulatory Visit (INDEPENDENT_AMBULATORY_CARE_PROVIDER_SITE_OTHER): Payer: Medicare Other

## 2022-02-07 DIAGNOSIS — I441 Atrioventricular block, second degree: Secondary | ICD-10-CM

## 2022-02-08 LAB — CUP PACEART REMOTE DEVICE CHECK
Battery Remaining Longevity: 96 mo
Battery Voltage: 3.02 V
Brady Statistic RV Percent Paced: 19.75 %
Date Time Interrogation Session: 20230818075400
Implantable Pulse Generator Implant Date: 20210817
Lead Channel Impedance Value: 520 Ohm
Lead Channel Pacing Threshold Amplitude: 0.625 V
Lead Channel Pacing Threshold Pulse Width: 0.4 ms
Lead Channel Sensing Intrinsic Amplitude: 16.538 mV
Lead Channel Setting Pacing Amplitude: 1.125
Lead Channel Setting Pacing Pulse Width: 0.4 ms
Lead Channel Setting Sensing Sensitivity: 2 mV

## 2022-02-21 ENCOUNTER — Other Ambulatory Visit: Payer: Self-pay | Admitting: Internal Medicine

## 2022-02-21 ENCOUNTER — Telehealth: Payer: Self-pay

## 2022-02-21 MED ORDER — SPIRONOLACTONE 25 MG PO TABS
25.0000 mg | ORAL_TABLET | Freq: Every day | ORAL | 0 refills | Status: DC
Start: 2022-02-21 — End: 2022-03-19

## 2022-02-21 NOTE — Addendum Note (Signed)
Addended by: Carter Kitten D on: 02/21/2022 01:19 PM   Modules accepted: Orders

## 2022-02-21 NOTE — Telephone Encounter (Signed)
Left message informing patient her novo nordisk order is ready for pickup.  4 boxes of ozempic (4m dose) are ready for pickup in med room fridge.

## 2022-02-26 DIAGNOSIS — M7989 Other specified soft tissue disorders: Secondary | ICD-10-CM | POA: Diagnosis not present

## 2022-02-26 DIAGNOSIS — I83811 Varicose veins of right lower extremities with pain: Secondary | ICD-10-CM | POA: Diagnosis not present

## 2022-02-26 DIAGNOSIS — I83891 Varicose veins of right lower extremities with other complications: Secondary | ICD-10-CM | POA: Diagnosis not present

## 2022-02-26 NOTE — Telephone Encounter (Signed)
Patient presents to clinic. Provided with samples per [facility].   Talbot Grumbling, RN

## 2022-02-27 ENCOUNTER — Encounter: Payer: Self-pay | Admitting: Family Medicine

## 2022-02-27 ENCOUNTER — Ambulatory Visit (INDEPENDENT_AMBULATORY_CARE_PROVIDER_SITE_OTHER): Payer: Medicare Other | Admitting: Family Medicine

## 2022-02-27 VITALS — BP 141/65 | HR 67 | Ht 67.0 in | Wt 221.4 lb

## 2022-02-27 DIAGNOSIS — E669 Obesity, unspecified: Secondary | ICD-10-CM

## 2022-02-27 NOTE — Assessment & Plan Note (Signed)
-  patient will contact prior bariatric surgery office to get records -bariatric surgery referral placed per patient preference since she would like to speak with them -reassurance provided and congratulated patient on the progress she has made with her weight and thus overall health, recommended she do more focused exercises and stretches that trigger her abdominal and core muscles, she agreed and will work on this prior to PT referral which may be placed at next visit if appropriate -follow up in 3 months for DM management

## 2022-02-27 NOTE — Patient Instructions (Signed)
It was great seeing you today!  I have placed a referral to bariatric surgery so that you can speak with them. Your abdominal exam looked great! Try to do stretches and exercises at home, if this is not working out as well for you then I can place another referral to physical therapy. Trying exercises that target your belly can help reduce fat in that area.   Please follow up at your next scheduled appointment in 3 months, if anything arises between now and then, please don't hesitate to contact our office.   Thank you for allowing [place] to be a part of your medical care!  Thank you, Dr. Larae Grooms

## 2022-02-27 NOTE — Progress Notes (Signed)
    SUBJECTIVE:   CHIEF COMPLAINT / HPI:   Patient presents for a follow up. She has had bariatric surgery in the past in 2015 and would like to get her records from there. I encouraged her to reach out to their office to get these records so that she can start seeing someone locally. She wants to have a visit with them as a follow up. Although she is loosing weight, she feels that her belly is still large. She is not necessarily wanting surgery but is open to do so and wants to get input from the bariatric surgery. She complained of tremors at a previous appointment and is not longer concerned with this as lab work at the time was negative and these symptoms rarely occur, if at all.   OBJECTIVE:   BP (!) 141/65   Pulse 67   Ht 5' 7"  (1.702 m)   Wt 221 lb 6.4 oz (100.4 kg)   SpO2 100%   BMI 34.68 kg/m   General: Patient well-appearing, in no acute distress. CV: RRR, no murmurs or gallops auscultated Resp: CTAB, no wheezing, rales or rhonchi noted Abdomen: soft, nontender, nondistended, presence of bowel sounds, no evidence of organomegaly or henia Ext: no LE edema noted bilaterally Psych: mood appropriate, very pleasant   ASSESSMENT/PLAN:   Morbid obesity ([place]) -patient will contact prior bariatric surgery office to get records -bariatric surgery referral placed per patient preference since she would like to speak with them -reassurance provided and congratulated patient on the progress she has made with her weight and thus overall health, recommended she do more focused exercises and stretches that trigger her abdominal and core muscles, she agreed and will work on this prior to PT referral which may be placed at next visit if appropriate -follow up in 3 months for DM management   -Med rec reviewed and updated appropriately.     Donney Dice, [place]

## 2022-02-28 ENCOUNTER — Other Ambulatory Visit: Payer: Self-pay | Admitting: Internal Medicine

## 2022-03-01 DIAGNOSIS — H353131 Nonexudative age-related macular degeneration, bilateral, early dry stage: Secondary | ICD-10-CM | POA: Diagnosis not present

## 2022-03-07 NOTE — Progress Notes (Signed)
Remote pacemaker transmission.   

## 2022-03-14 ENCOUNTER — Telehealth: Payer: Self-pay

## 2022-03-14 NOTE — Telephone Encounter (Signed)
Patient calls nurse line requesting a referral to a "back specialist."  Patient reports she does not wish to do PT again. Reports PT helpful, however not a "long term" solution.   Will forward to PCP.

## 2022-03-18 ENCOUNTER — Other Ambulatory Visit: Payer: Self-pay | Admitting: Internal Medicine

## 2022-03-18 DIAGNOSIS — M7989 Other specified soft tissue disorders: Secondary | ICD-10-CM | POA: Diagnosis not present

## 2022-03-18 DIAGNOSIS — I83811 Varicose veins of right lower extremities with pain: Secondary | ICD-10-CM | POA: Diagnosis not present

## 2022-03-18 DIAGNOSIS — I83891 Varicose veins of right lower extremities with other complications: Secondary | ICD-10-CM | POA: Diagnosis not present

## 2022-03-20 DIAGNOSIS — H43823 Vitreomacular adhesion, bilateral: Secondary | ICD-10-CM | POA: Diagnosis not present

## 2022-03-20 DIAGNOSIS — H353131 Nonexudative age-related macular degeneration, bilateral, early dry stage: Secondary | ICD-10-CM | POA: Diagnosis not present

## 2022-03-20 DIAGNOSIS — E113313 Type 2 diabetes mellitus with moderate nonproliferative diabetic retinopathy with macular edema, bilateral: Secondary | ICD-10-CM | POA: Diagnosis not present

## 2022-03-20 DIAGNOSIS — H26491 Other secondary cataract, right eye: Secondary | ICD-10-CM | POA: Diagnosis not present

## 2022-04-02 DIAGNOSIS — I87392 Chronic venous hypertension (idiopathic) with other complications of left lower extremity: Secondary | ICD-10-CM | POA: Diagnosis not present

## 2022-04-02 DIAGNOSIS — I83892 Varicose veins of left lower extremities with other complications: Secondary | ICD-10-CM | POA: Diagnosis not present

## 2022-04-03 ENCOUNTER — Other Ambulatory Visit: Payer: Self-pay | Admitting: Internal Medicine

## 2022-04-17 ENCOUNTER — Telehealth: Payer: Self-pay

## 2022-04-17 ENCOUNTER — Other Ambulatory Visit: Payer: Self-pay | Admitting: Family Medicine

## 2022-04-17 ENCOUNTER — Ambulatory Visit (INDEPENDENT_AMBULATORY_CARE_PROVIDER_SITE_OTHER): Payer: Medicare Other | Admitting: Family Medicine

## 2022-04-17 ENCOUNTER — Encounter: Payer: Self-pay | Admitting: Family Medicine

## 2022-04-17 VITALS — BP 124/61 | HR 73 | Ht 67.0 in | Wt 214.2 lb

## 2022-04-17 DIAGNOSIS — I152 Hypertension secondary to endocrine disorders: Secondary | ICD-10-CM | POA: Diagnosis not present

## 2022-04-17 DIAGNOSIS — M545 Low back pain, unspecified: Secondary | ICD-10-CM

## 2022-04-17 DIAGNOSIS — Z23 Encounter for immunization: Secondary | ICD-10-CM | POA: Diagnosis not present

## 2022-04-17 DIAGNOSIS — E1159 Type 2 diabetes mellitus with other circulatory complications: Secondary | ICD-10-CM

## 2022-04-17 DIAGNOSIS — E1140 Type 2 diabetes mellitus with diabetic neuropathy, unspecified: Secondary | ICD-10-CM | POA: Diagnosis not present

## 2022-04-17 LAB — POCT GLYCOSYLATED HEMOGLOBIN (HGB A1C): Hemoglobin A1C: 5.8 % — AB (ref 4.0–5.6)

## 2022-04-17 NOTE — Assessment & Plan Note (Signed)
-  BP 124/61, at goal -continue current antihypertensive regimen -may consider adding ACE or ARB for renal protection

## 2022-04-17 NOTE — Telephone Encounter (Signed)
Attempted to call patient to inform of appt at [facility] on Dec. 1st at 3:00 for CT scan. Patient is not to eat 4 hours prior to appt but can have liquids. No answer. LVM Salvatore Marvel, CMA

## 2022-04-17 NOTE — Assessment & Plan Note (Signed)
-  A1c 5.8, well-controlled and at goal. Congratulated patient on all her hard work! -continue current diabetic regimen -consider adding ACE/ARB for renoprotective effects  -plan to repeat A1c in 3-6 months

## 2022-04-17 NOTE — Progress Notes (Signed)
    SUBJECTIVE:   CHIEF COMPLAINT / HPI:   Patient presents for diabetes follow up. She is due for an A1c today. Compliant on diabetes regimen including weekly semaglutide 2 mg weekly. Glucose levels at home range around 90-130s. Denies any hypoglycemic episodes.    Hypertension  Denies chest pain and dyspnea. Compliant on amlodipine and lisinopril-HCTZ.   Back pain Patient presents with more than 2 years of back pain. History of degenerative disk disease and sciatica. She does not recall seeing ortho before. Leaning backward and forwards helps makes it better. Aggravating factors include certain movements or if she is standing for too long, at that time sitting makes it better. Describes pain as aching sensation along the left lower back and sometimes radiates down both legs. Occasional numbness and tingling.   OBJECTIVE:   BP 124/61   Pulse 73   Ht 5' 7"  (1.702 m)   Wt 214 lb 4 oz (97.2 kg)   SpO2 98%   BMI 33.56 kg/m   General: Patient well-appearing, in no acute distress. CV: RRR, no murmurs or gallops auscultated Resp: CTAB, no wheezing, rales or rhonchi noted Abdomen: soft, nontender, nondistended, presence of bowel sounds MSK: positive straight leg raise on the left, intact full ROM along hips and knees bilaterally, no gross abnormality, no erythema or edema noted, no LE edema noted bilaterally, no point tenderness noted along lumbar region upon light and deep palpation  Neuro: 5/5 LE strength bilaterally, gross sensation intact   ASSESSMENT/PLAN:   Type 2 diabetes, controlled, with neuropathy (HCC) -A1c 5.8, well-controlled and at goal. Congratulated patient on all her hard work! -continue current diabetic regimen -consider adding ACE/ARB for renoprotective effects  -plan to repeat A1c in 3-6 months  Hypertension associated with diabetes ([place]) -BP 124/61, at goal -continue current antihypertensive regimen -may consider adding ACE or ARB for renal protection   Low  back pain -multifactorial, sciatica contributing along with possible neuropathy. Patient has disc degeneration at baseline, worried that this is worsening or concerned for spinal stenosis. -ordered lumbar x-ray and CT lumbar to assess for possible stenosis and worsening progression, unable to obtain MRI as patient has a pacemaker -continue gabapentin, as this helps her pain -rehab stretching exercises handout provided for sciatic relief -patient would like to hold off on PT for now as she has been multiple times before, may consider at next visit -follow up in 1 month, may consider referral to ortho based on imaging results and patient's pain progression      Tru Leopard Larae Grooms, [place]

## 2022-04-17 NOTE — Assessment & Plan Note (Signed)
-  multifactorial, sciatica contributing along with possible neuropathy. Patient has disc degeneration at baseline, worried that this is worsening or concerned for spinal stenosis. -ordered lumbar x-ray and CT lumbar to assess for possible stenosis and worsening progression, unable to obtain MRI as patient has a pacemaker -continue gabapentin, as this helps her pain -rehab stretching exercises handout provided for sciatic relief -patient would like to hold off on PT for now as she has been multiple times before, may consider at next visit -follow up in 1 month, may consider referral to ortho based on imaging results and patient's pain progression

## 2022-04-17 NOTE — Telephone Encounter (Signed)
2nd reminder call to inform of Dec 1st appt at [facility]. No answer. LVM Salvatore Marvel, CMA

## 2022-04-17 NOTE — Patient Instructions (Signed)
It was great seeing you today!  Today we discussed your diabetes which looks great, your A1c is 5.8. Please continue to take all your medications as prescribed. For your back pain, we will get imaging. Please also do the exercises at home.   Please follow up at your next scheduled appointment in 1 month, if anything arises between now and then, please don't hesitate to contact our office.   Thank you for allowing [place] to be a part of your medical care!  Thank you, Dr. Larae Grooms  Also a reminder of our clinic's no-show policy. Please make sure to arrive at least 15 minutes prior to your scheduled appointment time. Please try to cancel before 24 hours if you are not able to make it. If you no-show for 2 appointments then you will be receiving a warning letter. If you no-show after 3 visits, then you may be at risk of being dismissed from our clinic. This is to ensure that everyone is able to be seen in a timely manner. Thank you, we appreciate your assistance with this!

## 2022-04-19 DIAGNOSIS — E113313 Type 2 diabetes mellitus with moderate nonproliferative diabetic retinopathy with macular edema, bilateral: Secondary | ICD-10-CM | POA: Diagnosis not present

## 2022-04-22 ENCOUNTER — Encounter (HOSPITAL_COMMUNITY): Payer: Medicare Other

## 2022-04-24 ENCOUNTER — Other Ambulatory Visit (HOSPITAL_COMMUNITY): Payer: Self-pay | Admitting: Family Medicine

## 2022-04-24 ENCOUNTER — Ambulatory Visit (HOSPITAL_COMMUNITY)
Admission: RE | Admit: 2022-04-24 | Discharge: 2022-04-24 | Disposition: A | Discharge status: Home / Self Care | Payer: Medicare Other | Source: Ambulatory Visit | Attending: Vascular Surgery | Admitting: Vascular Surgery

## 2022-04-24 ENCOUNTER — Ambulatory Visit (INDEPENDENT_AMBULATORY_CARE_PROVIDER_SITE_OTHER)
Admission: RE | Admit: 2022-04-24 | Discharge: 2022-04-24 | Disposition: A | Discharge status: Home / Self Care | Payer: Medicare Other | Source: Ambulatory Visit | Attending: Vascular Surgery | Admitting: Vascular Surgery

## 2022-04-24 DIAGNOSIS — I739 Peripheral vascular disease, unspecified: Secondary | ICD-10-CM

## 2022-04-26 ENCOUNTER — Ambulatory Visit
Admission: RE | Admit: 2022-04-26 | Discharge: 2022-04-26 | Disposition: A | Discharge status: Home / Self Care | Payer: Medicare Other | Source: Ambulatory Visit | Attending: Family Medicine | Admitting: Family Medicine

## 2022-04-26 DIAGNOSIS — M47816 Spondylosis without myelopathy or radiculopathy, lumbar region: Secondary | ICD-10-CM | POA: Diagnosis not present

## 2022-04-26 DIAGNOSIS — M545 Low back pain, unspecified: Secondary | ICD-10-CM | POA: Diagnosis not present

## 2022-04-26 DIAGNOSIS — M4316 Spondylolisthesis, lumbar region: Secondary | ICD-10-CM | POA: Diagnosis not present

## 2022-05-03 ENCOUNTER — Other Ambulatory Visit (HOSPITAL_COMMUNITY): Payer: Self-pay

## 2022-05-08 ENCOUNTER — Telehealth: Payer: Self-pay | Admitting: Pharmacist

## 2022-05-08 NOTE — Telephone Encounter (Signed)
Returned call to patient to discuss continued supply of  GATTEX (teduglitide) which was started after gastric bypass several years ago for short-gut.  Patient was followed by a specialty drug registered provider in [place], [place] for the last several years.  However, the provider no longer available (possibly moved out of state).   Patient reports having 1-2 weeks supply.   I will attempt to identify a potential provider who is registered with the "[employer]" to be a registered provider.  Patient requests a GI specialist within the [facility] group if possible.   I contacted our specialty pharmacy manager, Angela Burke, PharmD, in attempt to identify a potential provider.    Patient provided contact name "VERA" who works with/for [employer] and shared that I could contact her for more information/details at [phone]  I shared with patient that I would attempt to identify provider options.  She was appreciative of the support.

## 2022-05-08 NOTE — Telephone Encounter (Signed)
Noted and agree. 

## 2022-05-09 ENCOUNTER — Ambulatory Visit (INDEPENDENT_AMBULATORY_CARE_PROVIDER_SITE_OTHER): Payer: Medicare Other

## 2022-05-09 DIAGNOSIS — I441 Atrioventricular block, second degree: Secondary | ICD-10-CM | POA: Diagnosis not present

## 2022-05-10 ENCOUNTER — Telehealth: Payer: Self-pay | Admitting: Pharmacist

## 2022-05-10 ENCOUNTER — Other Ambulatory Visit: Payer: Self-pay | Admitting: Family Medicine

## 2022-05-10 DIAGNOSIS — Z9884 Bariatric surgery status: Secondary | ICD-10-CM

## 2022-05-10 NOTE — Telephone Encounter (Signed)
11/16 I received information from specialty pharmacy manager, Angela Burke - PharmD that the drug GATTEX has not been dispensed through the [facility] in the last year.   Contacted [facility] and inquired if they had a registered prescriber for [place] (teduglitide).  Informed no local prescriber in system.   12:15 Contacted manufacturer of GATTEX (teduglitide) - [employer] and determined that on the www.gattex.com website there is a find a provider function.   No provider in the local area identified.   Closest appeared to be: [facility] Dr. [name] "Margreta Journey" [place] Office # [phone]  Process requested by [facility] was a referral from [place] physician: "urgent visit" for evaluation and treatment of longstanding short-gut syndrome to prevent gap in medication - GATTEX (teduglitide) supply.   Patient has 1-2 weeks supply at this time.   12:45 attempted to contact patient Left message requesting call back.   1:10 Contacted and completed call to patient clarifying that we are placing a referral for her at this time to [place] She was comfortable with that plan.

## 2022-05-10 NOTE — Progress Notes (Signed)
Urgent referral placed to GI to maintain medication

## 2022-05-11 ENCOUNTER — Other Ambulatory Visit: Payer: Self-pay | Admitting: Family Medicine

## 2022-05-11 DIAGNOSIS — R4586 Emotional lability: Secondary | ICD-10-CM

## 2022-05-12 LAB — CUP PACEART REMOTE DEVICE CHECK
Battery Remaining Longevity: 96 mo
Battery Voltage: 3.02 V
Brady Statistic RV Percent Paced: 19.4 %
Date Time Interrogation Session: 20231117132000
Implantable Pulse Generator Implant Date: 20210817
Lead Channel Impedance Value: 450 Ohm
Lead Channel Pacing Threshold Amplitude: 0.5 V
Lead Channel Pacing Threshold Pulse Width: 0.4 ms
Lead Channel Sensing Intrinsic Amplitude: 18.788 mV
Lead Channel Setting Pacing Amplitude: 1 V
Lead Channel Setting Pacing Pulse Width: 0.4 ms
Lead Channel Setting Sensing Sensitivity: 2 mV

## 2022-05-13 ENCOUNTER — Encounter: Payer: Self-pay | Admitting: Family Medicine

## 2022-05-13 ENCOUNTER — Other Ambulatory Visit: Payer: Self-pay

## 2022-05-13 ENCOUNTER — Ambulatory Visit (INDEPENDENT_AMBULATORY_CARE_PROVIDER_SITE_OTHER): Payer: Medicare Other | Admitting: Family Medicine

## 2022-05-13 VITALS — BP 127/68 | HR 86 | Wt 210.4 lb

## 2022-05-13 DIAGNOSIS — I152 Hypertension secondary to endocrine disorders: Secondary | ICD-10-CM

## 2022-05-13 DIAGNOSIS — M545 Low back pain, unspecified: Secondary | ICD-10-CM

## 2022-05-13 DIAGNOSIS — E1159 Type 2 diabetes mellitus with other circulatory complications: Secondary | ICD-10-CM

## 2022-05-13 NOTE — Assessment & Plan Note (Signed)
-  chronic but pain decently controlled  -reviewed recent CT imaging with patient, pain likely secondary to spondylosis in the setting of degenerative disk disease and sciatica. Ideally I wanted to obtain an MRI to assess for spinal stenosis but patient has a pacemaker in place.  -extensively discussed conservative therapies such as water aerobics, stretching exercises and PT -ortho referral placed as patient may benefit from more therapy including steroid injections if appropriate -follow up as appropriate

## 2022-05-13 NOTE — Patient Instructions (Signed)
It was great seeing you today!  Today we discussed your back pain, I have placed a referral to orthopedics. If you do not hear back within 2 weeks then please contact our clinic so that we can assist with scheduling [facility] the [facility] and doing water aerobics, stretching exercises and physical therapy can help your pain tremendously.   Your blood pressure was high initially when you came in but is better now. Please continue to take all your blood pressure medications.  Please follow up at your next scheduled appointment, if anything arises between now and then, please don't hesitate to contact our office.   Thank you for allowing [place] to be a part of your medical care!  Thank you, Dr. Larae Grooms  Also a reminder of our clinic's no-show policy. Please make sure to arrive at least 15 minutes prior to your scheduled appointment time. Please try to cancel before 24 hours if you are not able to make it. If you no-show for 2 appointments then you will be receiving a warning letter. If you no-show after 3 visits, then you may be at risk of being dismissed from our clinic. This is to ensure that everyone is able to be seen in a timely manner. Thank you, we appreciate your assistance with this!

## 2022-05-13 NOTE — Telephone Encounter (Signed)
Noted and agree. 

## 2022-05-13 NOTE — Progress Notes (Signed)
    SUBJECTIVE:   CHIEF COMPLAINT / HPI:   Patient presents for follow up after ongoing low back pain that have started more than 2 years ago. She has a history of degenerative disk disease and sciatica. Relieving factors including sitting and leaning backwards and forwards. Worsening symptoms include standing for too long. She sometimes has to take a break and bend down when she is caring for her husband. Low mid back pain that radiates across the back to either side and down her left leg more than her right. She has not worked with PT since last visit and does not do any home stretching exercises.   Hypertension Compliant on antihypertensive regimen. Denies recent chest pain and dyspnea. Tolerating medications this time and denies concerns related to her blood pressure.   OBJECTIVE:   BP 127/68   Pulse 86   Wt 210 lb 6.4 oz (95.4 kg)   SpO2 100%   BMI 32.95 kg/m   General: Patient well-appearing, in no acute distress. CV: RRR, no murmurs or gallops auscultated Resp: CTAB, no wheezing, rales or rhonchi noted MSK: positive straight leg raise, normal active and passive ROM without pain elicited, no LE erythema or edema noted bilaterally   Neuro: 5/5 LE strength bilaterally, gross sensation intact, normal gait   ASSESSMENT/PLAN:   Hypertension associated with diabetes ([place]) -initially elevated BP but 127/68 on repeat, at goal -continue current antihypertensive regimen  -follow up in 2 months for repeat A1c   Low back pain -chronic but pain decently controlled  -reviewed recent CT imaging with patient, pain likely secondary to spondylosis in the setting of degenerative disk disease and sciatica. Ideally I wanted to obtain an MRI to assess for spinal stenosis but patient has a pacemaker in place.  -extensively discussed conservative therapies such as water aerobics, stretching exercises and PT -ortho referral placed as patient may benefit from more therapy including steroid injections  if appropriate -follow up as appropriate     [name] [name], [place]

## 2022-05-13 NOTE — Assessment & Plan Note (Signed)
-  initially elevated BP but 127/68 on repeat, at goal -continue current antihypertensive regimen  -follow up in 2 months for repeat A1c

## 2022-05-21 ENCOUNTER — Ambulatory Visit (INDEPENDENT_AMBULATORY_CARE_PROVIDER_SITE_OTHER): Payer: Medicare Other

## 2022-05-21 ENCOUNTER — Encounter: Payer: Self-pay | Admitting: Orthopedic Surgery

## 2022-05-21 ENCOUNTER — Ambulatory Visit (INDEPENDENT_AMBULATORY_CARE_PROVIDER_SITE_OTHER): Payer: Medicare Other | Admitting: Orthopedic Surgery

## 2022-05-21 VITALS — BP 124/69 | HR 101 | Ht 67.0 in | Wt 210.5 lb

## 2022-05-21 DIAGNOSIS — M5416 Radiculopathy, lumbar region: Secondary | ICD-10-CM

## 2022-05-21 NOTE — Progress Notes (Signed)
Orthopedic Spine Surgery Office Note  Assessment: Patient is a 66 y.o. female with low back that radiates into her bilateral anterior thighs, possible L3 distribution   Plan: -Explained that initially conservative treatment is tried as a significant number of patients may experience relief with these treatment modalities. Discussed that the conservative treatments include:  -activity modification  -physical therapy  -over the counter pain medications  -medrol dosepak  -lumbar steroid injections -Patient has tried physical therapy for 2-3 months under the supervision of Dr. Larae Grooms and has been regularly using ibuprofen  -Recommended MRI of the lumbar spine to evaluate for radiculopathy -Discussed possible injection for diagnostic and therapeutic purposes if the MRI shows stenosis -Patient should return to office in 3 weeks, repeat x-rays of lumbar spine at next visit: scoli films   Patient expressed understanding of the plan and all questions were answered to the patient's satisfaction.   ___________________________________________________________________________   History:  Patient is a 66 y.o. female who presents today for lumbar spine.  Patient has had several years of low back pain that radiates into her bilateral anterior thighs.  The anterior thigh pain started more recently -within the last year.  She says she gets that more when she is standing or is bending forward.  She feels better if she sits down or leans backward.  She has tried physical therapy and over-the-counter medications.  Pain has been getting progressively worse.  There is no trauma or injury that brought on the pain.   Weakness: Yes, feels her legs are weaker.  No other weakness Symptoms of imbalance: Yes, periodically feels off balance Paresthesias and numbness: Yes, has diabetic neuropathy.  No new numbness or tingling Bowel or bladder incontinence: Denies Saddle anesthesia: Denies  Treatments tried: Physical  therapy, activity modification, over-the-counter pain medications  Review of systems: Denies fevers and chills, night sweats, unexplained weight loss, history of cancer, pain that wakes them at night  Past medical history: Hyperlipidemia Hypertension Diabetes (5.8)  Neuropathy Sleep apnea Short-bowel syndrome  Allergies: cephalexin, latex, neomycin, tape  Past surgical history:  Cataract surgery Pacemaker insertion Gastric bypass Knee replacements Laparoscopic intestinal surgery Left leg surgery Toothless extraction Parathyroidectomy Tonsillectomy Varicose vein surgery   Social history: Denies use of nicotine product (smoking, vaping, patches, smokeless) Alcohol use: denies Denies recreational drug use   Physical Exam:  General: no acute distress, appears stated age Neurologic: alert, answering questions appropriately, following commands Respiratory: unlabored breathing on room air, symmetric chest rise Psychiatric: appropriate affect, normal cadence to speech   MSK (spine):  -Strength exam      Left  Right EHL    4/5  4/5 TA    5/5  5/5 GSC    5/5  5/5 Knee extension  5/5  5/5 Hip flexion   5/5  5/5  -Sensory exam    Sensation intact to light touch in L3-S1 nerve distributions of bilateral lower extremities  -Achilles DTR: 1/4 on the left, 1/4 on the right -Patellar tendon DTR: 1/4 on the left, 1/4 on the right  -Straight leg raise: negative -Contralateral straight leg raise: negative -Clonus: no beats bilaterally -Negative hoffman bilaterally -Negative grip and release test -No interosseous muscle wasting  -Left hip exam: no pain through range of motion, negative stinchfield -Right hip exam: no pain through range of motion, negative stinchfield  Imaging: XR of the lumbar spine from 05/21/2022 was independently reviewed and interpreted, showing retrolisthesis of L3 on L4. Disc height loss at multiple levels. Lumbar scoliosis with apex  to the  right. No fracture or dislocation. No evidence of instability on flexion/extension.    Patient name: [name] Patient MRN: [id number] Date of visit: 05/21/22

## 2022-05-24 ENCOUNTER — Other Ambulatory Visit: Payer: Medicare Other

## 2022-05-24 ENCOUNTER — Ambulatory Visit (INDEPENDENT_AMBULATORY_CARE_PROVIDER_SITE_OTHER): Payer: Medicare Other | Admitting: Family Medicine

## 2022-05-24 ENCOUNTER — Encounter: Payer: Self-pay | Admitting: Family Medicine

## 2022-05-24 VITALS — BP 128/55 | HR 72 | Wt 210.5 lb

## 2022-05-24 DIAGNOSIS — R4586 Emotional lability: Secondary | ICD-10-CM | POA: Diagnosis not present

## 2022-05-24 MED ORDER — BUPROPION HCL 100 MG PO TABS: 100.0000 mg | ORAL_TABLET | Freq: Every day | ORAL | 2 refills | Status: DC

## 2022-05-24 NOTE — Progress Notes (Signed)
    SUBJECTIVE:   CHIEF COMPLAINT / HPI:   Patient presents for mood follow up. She reports that her mood has not been ideal for awhile. 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 Previously, they used to get help and he used to have a caregiver come daily to help out to provide her with relief. Now it is primarily the patient and her daughter caring for her husband. This can take a toll and has on her mood. It has been difficult to find a good caregiver that fits. They have PT and OT that comes to the house but there is still limited assistance and it has been an ongoing journey to find someone. She is still taking her sertraline 100 mg daily and says that it does not help her at all. She is tired most days and does not get adequate sleep.   OBJECTIVE:   BP (!) 128/55   Pulse 72   Wt 210 lb 8 oz (95.5 kg)   SpO2 100%   BMI 32.97 kg/m   General: Patient well-appearing, in no acute distress. Resp: normal work of breathing noted without signs of respiratory distress Psych: decreased mood and is tearful during part of the encounter, denies SI or HI, very pleasant   ASSESSMENT/PLAN:   Mood changes -patient politely declines completion of PHQ-9 -continue sertraline 100 mg -starting wellbutrin 100 mg daily  -reassurance provided and discussed coping techniques -patient would prefer having routine visits with me to discuss her mood as she says that these have been very helpful today and in the past -follow up in 2 weeks for mood check      Oryn Casanova Robyne Peers, DO [facility]

## 2022-05-24 NOTE — Patient Instructions (Signed)
It was great seeing you today!  Today we discussed your mood. Please continue to take your sertraline 100 mg. I want to add Wellbutrin 100 mg, please take this daily to help with your mood.   Please follow up at your next scheduled appointment in 2 weeks, if anything arises between now and then, please don't hesitate to contact our office.   Thank you for allowing [place] to be a part of your medical care!  Thank you, Dr. Larae Grooms  Also a reminder of our clinic's no-show policy. Please make sure to arrive at least 15 minutes prior to your scheduled appointment time. Please try to cancel before 24 hours if you are not able to make it. If you no-show for 2 appointments then you will be receiving a warning letter. If you no-show after 3 visits, then you may be at risk of being dismissed from our clinic. This is to ensure that everyone is able to be seen in a timely manner. Thank you, we appreciate your assistance with this!

## 2022-05-25 ENCOUNTER — Other Ambulatory Visit: Payer: Self-pay | Admitting: Family Medicine

## 2022-05-25 DIAGNOSIS — R4586 Emotional lability: Secondary | ICD-10-CM

## 2022-05-25 MED ORDER — BUPROPION HCL ER (XL) 150 MG PO TB24: 150.0000 mg | ORAL_TABLET | Freq: Every day | ORAL | 0 refills | Status: DC

## 2022-05-25 NOTE — Assessment & Plan Note (Signed)
-  patient politely declines completion of PHQ-9 -continue sertraline 100 mg -starting wellbutrin 100 mg daily  -reassurance provided and discussed coping techniques -patient would prefer having routine visits with me to discuss her mood as she says that these have been very helpful today and in the past -follow up in 2 weeks for mood check

## 2022-05-26 ENCOUNTER — Other Ambulatory Visit: Payer: Self-pay | Admitting: Family Medicine

## 2022-05-26 ENCOUNTER — Telehealth: Payer: Self-pay | Admitting: Family Medicine

## 2022-05-26 NOTE — Telephone Encounter (Signed)
Called patient and made her aware of the mistake that short-acting wellbutrin prescribed. This prescription has been discontinued. Instead prescribed XL 150 mg daily. Called patient's pharmacy (spoke with pharmacist Adonis Huguenin) to verify discontinuation of old medication which they have inactivated and have the new medication in place. Instructed patient to stop taking her current wellbutrin as she has not taken any today but took 2 doses prior from the past 2 days. Instructed her that the pharmacy will let her know when the new prescription is ready. She is not to take her current form that was previously prescribed. Patient voiced understanding and in agreement. All questions answered. Patient was appreciative of the update.

## 2022-05-29 NOTE — Progress Notes (Signed)
Remote pacemaker transmission.   

## 2022-05-30 NOTE — Addendum Note (Signed)
Addended by: Ileene Rubens on: 05/30/2022 01:43 PM   Modules accepted: Orders

## 2022-05-31 DIAGNOSIS — H43823 Vitreomacular adhesion, bilateral: Secondary | ICD-10-CM | POA: Diagnosis not present

## 2022-05-31 DIAGNOSIS — E113313 Type 2 diabetes mellitus with moderate nonproliferative diabetic retinopathy with macular edema, bilateral: Secondary | ICD-10-CM | POA: Diagnosis not present

## 2022-05-31 DIAGNOSIS — E13319 Other specified diabetes mellitus with unspecified diabetic retinopathy without macular edema: Secondary | ICD-10-CM | POA: Diagnosis not present

## 2022-06-01 DIAGNOSIS — K909 Intestinal malabsorption, unspecified: Secondary | ICD-10-CM | POA: Diagnosis not present

## 2022-06-01 DIAGNOSIS — E118 Type 2 diabetes mellitus with unspecified complications: Secondary | ICD-10-CM | POA: Diagnosis not present

## 2022-06-01 DIAGNOSIS — Z9884 Bariatric surgery status: Secondary | ICD-10-CM | POA: Diagnosis not present

## 2022-06-01 DIAGNOSIS — I10 Essential (primary) hypertension: Secondary | ICD-10-CM | POA: Diagnosis not present

## 2022-06-07 ENCOUNTER — Ambulatory Visit (INDEPENDENT_AMBULATORY_CARE_PROVIDER_SITE_OTHER): Payer: Medicare Other | Admitting: Family Medicine

## 2022-06-07 ENCOUNTER — Encounter: Payer: Self-pay | Admitting: Family Medicine

## 2022-06-07 VITALS — BP 138/75 | HR 63 | Ht 67.0 in | Wt 206.4 lb

## 2022-06-07 DIAGNOSIS — R4586 Emotional lability: Secondary | ICD-10-CM | POA: Diagnosis not present

## 2022-06-07 DIAGNOSIS — G6289 Other specified polyneuropathies: Secondary | ICD-10-CM

## 2022-06-07 MED ORDER — GABAPENTIN 100 MG PO CAPS: 200.0000 mg | ORAL_CAPSULE | Freq: Two times a day (BID) | ORAL | 4 refills | Status: DC

## 2022-06-07 NOTE — Assessment & Plan Note (Signed)
-  PHQ-9 score of 1 with negative 9 reviewed and discussed. -congratulated patient on her significant improvement and all the work she has put in into improving her mood -reassurance provided and coping strategies reiterated  -continue sertraline and wellbutrin as prescribed -follow up in 1 month for A1c check and DM follow up, refills on gabapentin sent to desired pharmacy

## 2022-06-07 NOTE — Progress Notes (Signed)
    SUBJECTIVE:   CHIEF COMPLAINT / HPI:   Patient presents for mood follow up. Continued on sertraline daily and started on wellbutrin, reports compliance. Adds that she feels tired and has less energy a couple days of the week rather than the several days listed on the form. 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 She says that she noticed a positive change in her mood since starting the wellbutrin at her last visit. Her brother-in-law and his wife are still over since Thanksgiving which has been mildly stressful for her and her husband. Reports that she feels good. Her adult children, especially her daughter, serve as a significant support system for her.   OBJECTIVE:   BP 138/75   Pulse 63   Ht 5' 7"  (1.702 m)   Wt 206 lb 6.4 oz (93.6 kg)   SpO2 100%   BMI 32.33 kg/m   General: Patient well-appearing, in no acute distress. Resp: normal work of breathing noted  Psych: mood appropriate and noticeable improved since last visit as patient smiling throughout the visit, denies SI   ASSESSMENT/PLAN:   Mood changes -PHQ-9 score of 1 with negative 9 reviewed and discussed. -congratulated patient on her significant improvement and all the work she has put in into improving her mood -reassurance provided and coping strategies reiterated  -continue sertraline and wellbutrin as prescribed -follow up in 1 month for A1c check and DM follow up, refills on gabapentin sent to desired pharmacy    Donney Dice, [place]

## 2022-06-07 NOTE — Patient Instructions (Signed)
It was great seeing you today!  Today we discussed your mood, I am so proud of all the improvement that you have made. Please continue your sertraline and wellbutrin as prescribed.   At the next visit, we will check an A1c to monitor your diabetes.   Please follow up at your next scheduled appointment in 1 month, if anything arises between now and then, please don't hesitate to contact our office.   Thank you for allowing [place] to be a part of your medical care!  Thank you, Dr. Larae Grooms  Also a reminder of our clinic's no-show policy. Please make sure to arrive at least 15 minutes prior to your scheduled appointment time. Please try to cancel before 24 hours if you are not able to make it. If you no-show for 2 appointments then you will be receiving a warning letter. If you no-show after 3 visits, then you may be at risk of being dismissed from our clinic. This is to ensure that everyone is able to be seen in a timely manner. Thank you, we appreciate your assistance with this!

## 2022-06-11 ENCOUNTER — Telehealth: Payer: Self-pay

## 2022-06-11 NOTE — Telephone Encounter (Signed)
Received notification from [employer] [place] regarding RE-ENROLLMENT approval for OZEMPIC 62m. Patient assistance approved from 06/10/22 to 06/24/23.  MEDICATION WILL SHIP TO OFFICE  Phone: [phone]

## 2022-06-13 ENCOUNTER — Ambulatory Visit: Payer: Medicare Other | Admitting: Orthopedic Surgery

## 2022-06-28 ENCOUNTER — Ambulatory Visit (INDEPENDENT_AMBULATORY_CARE_PROVIDER_SITE_OTHER): Payer: Medicare Other | Admitting: Family Medicine

## 2022-06-28 ENCOUNTER — Encounter: Payer: Self-pay | Admitting: Family Medicine

## 2022-06-28 VITALS — BP 122/55 | Ht 67.0 in | Wt 201.0 lb

## 2022-06-28 DIAGNOSIS — R4586 Emotional lability: Secondary | ICD-10-CM

## 2022-06-28 DIAGNOSIS — Z23 Encounter for immunization: Secondary | ICD-10-CM | POA: Diagnosis not present

## 2022-06-28 DIAGNOSIS — E1140 Type 2 diabetes mellitus with diabetic neuropathy, unspecified: Secondary | ICD-10-CM

## 2022-06-28 DIAGNOSIS — L84 Corns and callosities: Secondary | ICD-10-CM

## 2022-06-28 DIAGNOSIS — E1142 Type 2 diabetes mellitus with diabetic polyneuropathy: Secondary | ICD-10-CM | POA: Diagnosis not present

## 2022-06-28 LAB — POCT GLYCOSYLATED HEMOGLOBIN (HGB A1C): HbA1c, POC (controlled diabetic range): 5.7 % (ref 0.0–7.0)

## 2022-06-28 NOTE — Patient Instructions (Addendum)
It was great seeing you today!  Today we discussed your diabetes, your A1c is 5.7! Continue doing what you are doing! Regarding your foot, this seems to be due to a callus which forms from the skin thickening in that area from all the pressure placed. I recommend using comfortable shoes with padding. Soaking your feet in warm water with epsom salt can help as well.   Please follow up at your next scheduled appointment in 3 months, if anything arises between now and then, please don't hesitate to contact our office.   Thank you for allowing [place] to be a part of your medical care!  Thank you, Dr. Larae Grooms  Also a reminder of our clinic's no-show policy. Please make sure to arrive at least 15 minutes prior to your scheduled appointment time. Please try to cancel before 24 hours if you are not able to make it. If you no-show for 2 appointments then you will be receiving a warning letter. If you no-show after 3 visits, then you may be at risk of being dismissed from our clinic. This is to ensure that everyone is able to be seen in a timely manner. Thank you, we appreciate your assistance with this!

## 2022-06-28 NOTE — Assessment & Plan Note (Signed)
-  PHQ-9 score of 1 with negative question 9 reviewed and discussed.  -continues to be doing well on antidepressant regimen with appropriate mood -continue sertraline and wellbutrin as prescribed -reassurance provided  -follow up in 1-2 months for mood check, per patient preference

## 2022-06-28 NOTE — Progress Notes (Signed)
    SUBJECTIVE:   CHIEF COMPLAINT / HPI:   Patient presents for DM follow up. Taking semaglutide and compliant on this weekly. Denies any concerns other than left foot pain for the past few months. This seems to be ongoing. Checks glucose levels at home, ranging between 100-120s. Denies any hypoglycemic episodes. Denies any trauma related to her foot or any other symptoms.   Reports mood has been good, doing well over the holidays. Compliant on zoloft and wellbutrin. Denies any concerns regarding this as well.   OBJECTIVE:   BP (!) 122/55   Ht '5\' 7"'$  (1.702 m)   Wt 201 lb (91.2 kg)   BMI 31.48 kg/m   General: Patient well-appearing, in no acute distress. CV: RRR, no murmurs or gallops auscultated  Resp: CTAB, no wheezing, rales or rhonchi noted Ext: normal foot exam with normal microfilament testing without wounds or ulcers noted, 0.5 cm callus noted along the lateral aspect of left foot with tenderness to palpation without surrounding erythema or edema noted Neuro: sensation intact and 2+ achilles reflex bilaterally, normal gait  Psych: mood appropriate, very pleasant, denies SI or HI   ASSESSMENT/PLAN:   Type 2 diabetes, controlled, with neuropathy ([facility]) -A1c 5.7, at goal -continue current DM regimen -continue lifestyle modifications -foot exam wnl  -up to date on optho screening, explained that this should occur once a year at least -follow up in 3 months for repeat A1c   Mood changes -PHQ-9 score of 1 with negative question 9 reviewed and discussed.  -continues to be doing well on antidepressant regimen with appropriate mood -continue sertraline and wellbutrin as prescribed -reassurance provided  -follow up in 1-2 months for mood check, per patient preference  Callus of foot -causing left foot pain, also considered worsening neuropathy but reassuringly DM is well-controlled -discussed conservative measures including padding and soaking feet  -handout provided  -foot  hygiene  -follow up as appropriate     [name] [name], [name]

## 2022-06-28 NOTE — Assessment & Plan Note (Signed)
-  A1c 5.7, at goal -continue current DM regimen -continue lifestyle modifications -foot exam wnl  -up to date on optho screening, explained that this should occur once a year at least -follow up in 3 months for repeat A1c

## 2022-06-28 NOTE — Assessment & Plan Note (Addendum)
-  causing left foot pain, also considered worsening neuropathy but reassuringly DM is well-controlled -discussed conservative measures including padding and soaking feet  -handout provided  -foot hygiene  -follow up as appropriate

## 2022-06-29 DIAGNOSIS — Z9884 Bariatric surgery status: Secondary | ICD-10-CM | POA: Diagnosis not present

## 2022-06-29 DIAGNOSIS — E118 Type 2 diabetes mellitus with unspecified complications: Secondary | ICD-10-CM | POA: Diagnosis not present

## 2022-06-29 DIAGNOSIS — I10 Essential (primary) hypertension: Secondary | ICD-10-CM | POA: Diagnosis not present

## 2022-06-29 DIAGNOSIS — E669 Obesity, unspecified: Secondary | ICD-10-CM | POA: Diagnosis not present

## 2022-07-12 DIAGNOSIS — E113313 Type 2 diabetes mellitus with moderate nonproliferative diabetic retinopathy with macular edema, bilateral: Secondary | ICD-10-CM | POA: Diagnosis not present

## 2022-07-12 DIAGNOSIS — H43823 Vitreomacular adhesion, bilateral: Secondary | ICD-10-CM | POA: Diagnosis not present

## 2022-07-18 ENCOUNTER — Telehealth: Payer: Self-pay

## 2022-07-18 ENCOUNTER — Ambulatory Visit (HOSPITAL_COMMUNITY)
Admission: RE | Admit: 2022-07-18 | Discharge: 2022-07-18 | Disposition: A | Discharge status: Home / Self Care | Payer: Medicare Other | Source: Ambulatory Visit | Attending: Orthopedic Surgery | Admitting: Orthopedic Surgery

## 2022-07-18 DIAGNOSIS — M4726 Other spondylosis with radiculopathy, lumbar region: Secondary | ICD-10-CM | POA: Diagnosis not present

## 2022-07-18 DIAGNOSIS — M5416 Radiculopathy, lumbar region: Secondary | ICD-10-CM | POA: Insufficient documentation

## 2022-07-18 DIAGNOSIS — M5116 Intervertebral disc disorders with radiculopathy, lumbar region: Secondary | ICD-10-CM | POA: Diagnosis not present

## 2022-07-18 NOTE — Telephone Encounter (Signed)
Informed patient her novo nordisk shipment is ready for pickup.  4 boxes of ozempic '2mg'$  dose pens are labeled and ready in fridge

## 2022-07-18 NOTE — Progress Notes (Signed)
Per order, Changed device settings for MRI to VOO at 90 bpm Will program device back to pre-MRI settings after completion of exam, and send transmission.

## 2022-07-19 NOTE — Telephone Encounter (Signed)
Patient presents for medication pick up. Provided per note from [place].   Talbot Grumbling, RN

## 2022-07-25 ENCOUNTER — Ambulatory Visit (INDEPENDENT_AMBULATORY_CARE_PROVIDER_SITE_OTHER): Payer: Medicare Other | Admitting: Orthopedic Surgery

## 2022-07-25 DIAGNOSIS — M5416 Radiculopathy, lumbar region: Secondary | ICD-10-CM

## 2022-07-25 NOTE — Progress Notes (Signed)
Orthopedic Spine Surgery Office Note  Assessment: Patient is a 67 y.o. female with low back pain that radiates into her bilateral anterior thighs and medial calves.  MRI shows L3/4 central, lateral recess, and foraminal stenosis at L3/4.    Plan: -Explained that initially conservative treatment is tried as a significant number of patients may experience relief with these treatment modalities. Discussed that the conservative treatments include:  -activity modification  -physical therapy  -over the counter pain medications  -medrol dosepak  -lumbar steroid injections -Patient has tried  Physical therapy, activity modification, over-the-counter pain medications  -Recommended L3/4 transforaminal injection. Referral provided to Dr. Ernestina Patches today -Patient should return to office in 5 weeks, x-rays at next visit: none   Patient expressed understanding of the plan and all questions were answered to the patient's satisfaction.   ___________________________________________________________________________  History: Patient is a 67 y.o. female who has been previously seen in the office for symptoms consistent with lumbar radiculopathy. Since the last visit, symptoms have largely remained the same.  She describes low back pain that radiates into her bilateral anterior thighs and the medial calves.  Her pain is about equally in her low back as it is in her legs.  She has these symptoms in the bilateral lower extremities.  Pain does not radiate into the feet.  Has diabetic neuropathy in her feet but no new numbness or tingling  Previous treatments:  Physical therapy, activity modification, over-the-counter pain medications   Physical Exam:  General: no acute distress, appears stated age Neurologic: alert, answering questions appropriately, following commands Respiratory: unlabored breathing on room air, symmetric chest rise Psychiatric: appropriate affect, normal cadence to speech   MSK  (spine):  -Strength exam      Left  Right EHL    4/5  4/5 TA    5/5  5/5 GSC    5/5  5/5 Knee extension  5/5  5/5 Hip flexion   5/5  5/5  -Sensory exam    Sensation intact to light touch in L3-S1 nerve distributions of bilateral lower extremities  -Achilles DTR: 1/4 on the left, 1/4 on the right -Patellar tendon DTR: 1/4 on the left, 1/4 on the right  -Straight leg raise: negative -Contralateral straight leg raise: negative -Femoral nerve stretch test: negative bilaterally -Clonus: no beats bilaterally  Imaging: XR of the lumbar spine from 05/21/2022 was previously independently reviewed and interpreted, showing retrolisthesis of L3 on L4. Disc height loss at multiple levels. Lumbar scoliosis with apex to the right. No fracture or dislocation. No evidence of instability on flexion/extension.    MRI of the lumbar spine from 07/18/2022 was independently reviewed and interpreted, showing DDD at L2/3, L3/4, L4/5.  Modic changes at L2/3 and L4/5.  Central, lateral recess, and foraminal (L>R) stenosis at L3/4.  Lateral recess stenosis at L4/5.   Patient name: [name] Patient MRN: [id number] Date of visit: 07/25/22

## 2022-08-05 ENCOUNTER — Ambulatory Visit (INDEPENDENT_AMBULATORY_CARE_PROVIDER_SITE_OTHER): Payer: Medicare Other | Admitting: Orthopedic Surgery

## 2022-08-05 ENCOUNTER — Encounter: Payer: Self-pay | Admitting: Orthopedic Surgery

## 2022-08-05 ENCOUNTER — Ambulatory Visit (INDEPENDENT_AMBULATORY_CARE_PROVIDER_SITE_OTHER): Payer: Medicare Other

## 2022-08-05 DIAGNOSIS — M79672 Pain in left foot: Secondary | ICD-10-CM

## 2022-08-05 DIAGNOSIS — L97521 Non-pressure chronic ulcer of other part of left foot limited to breakdown of skin: Secondary | ICD-10-CM

## 2022-08-05 NOTE — Progress Notes (Signed)
Office Visit Note   Patient: [name]           Date of Birth: [birth year]           MRN: [id number] Visit Date: 08/05/2022              Requested by: Donney Dice, DO [place],  [place] [address] PCP: Donney Dice, DO  Chief Complaint  Patient presents with   Left Foot - Pain      HPI: Patient is a 67 year old woman who presents with a 38-monthhistory of pain lateral border left foot.  Patient has been using lidocaine patches denies any trauma pain with weightbearing.  Assessment & Plan: Visit Diagnoses:  1. Pain in left foot   2. Non-pressure chronic ulcer of other part of left foot limited to breakdown of skin ([place]     Plan: The hyperkeratotic lesion was debrided on the left foot and was 10 mm in diameter 3 mm deep after debridement.  2 calluses were pared on the right heel.  Patient will follow-up with these hyperkeratotic lesions recur.  Follow-Up Instructions: Return if symptoms worsen or fail to improve.   Ortho Exam  Patient is alert, oriented, no adenopathy, well-dressed, normal affect, normal respiratory effort. Examination patient has a palpable pulse.  There is no swelling or redness she has a hyperkeratotic lesion base of the fifth metatarsal and left foot and 2 hyperkeratotic lesions medial to the right heel.  A 10 blade knife was used to debride the hyperkeratotic lesions back to healthy viable tissue.  Imaging: XR Foot Complete Left  Result Date: 08/05/2022 Three-view radiographs of the left foot shows joint space narrowing with sclerotic changes at the MTP joint great toe.  There is calcification of the arteries to the midfoot there is degenerative arthritic changes through the Lisfranc complex and talonavicular joint.  No images are attached to the encounter.  Labs: Lab Results  Component Value Date   HGBA1C 5.7 06/28/2022   HGBA1C 5.8 (A) 04/17/2022   HGBA1C 6.9 11/23/2021   ESRSEDRATE 62 (H) 09/28/2007   ESRSEDRATE 67 (H)  09/21/2007   ESRSEDRATE 86 (H) 09/17/2007   REPTSTATUS 08/07/2020 FINAL 08/04/2020   GRAMSTAIN  01/07/2012    NO WBC SEEN NO SQUAMOUS EPITHELIAL CELLS SEEN NO ORGANISMS SEEN   CULT  08/04/2020    NO GROUP A STREP (S.PYOGENES) ISOLATED Performed at [facility] [id number] [place] [id number]   LABORGA STAPHYLOCOCCUS AUREUS 01/07/2012     Lab Results  Component Value Date   ALBUMIN 4.5 11/05/2021   ALBUMIN 3.9 02/15/2021   ALBUMIN 4.5 11/16/2020   PREALBUMIN 20.4 05/25/2015    Lab Results  Component Value Date   MG 2.1 05/07/2017   MG 2.0 05/25/2015   MG 1.6 (L) 11/10/2013   Lab Results  Component Value Date   VD25OH 21.04 (L) 05/12/2020   VD25OH 20.4 (L) 04/29/2018   VD25OH 14.8 (L) 10/13/2017    Lab Results  Component Value Date   PREALBUMIN 20.4 05/25/2015      Latest Ref Rng & Units 11/05/2021    2:23 PM 02/15/2021    8:40 AM 09/07/2020    3:50 PM  CBC EXTENDED  WBC 3.4 - 10.8 x10E3/uL 4.8  6.4  5.6   RBC 3.77 - 5.28 x10E6/uL 4.79  4.92  5.09   Hemoglobin 11.1 - 15.9 g/dL 12.3  13.3  13.4   HCT 34.0 - 46.6 %  38.2  43.1  41.6   Platelets 150 - 450 x10E3/uL 238  221  254      There is no height or weight on file to calculate BMI.  Orders:  Orders Placed This Encounter  Procedures   XR Foot Complete Left   No orders of the defined types were placed in this encounter.    Procedures: No procedures performed  Clinical Data: No additional findings.  ROS:  All other systems negative, except as noted in the HPI. Review of Systems  Objective: Vital Signs: There were no vitals taken for this visit.  Specialty Comments:  No specialty comments available.  PMFS History: Patient Active Problem List   Diagnosis Date Noted   Callus of foot 06/28/2022   Tremor 11/05/2021   Mood changes 07/17/2021   Neck muscle spasm 03/17/2021   Type 2 DM with diabetic neuropathy affecting both sides of body ([place]) 01/09/2021   Right leg  swelling 12/27/2020   Parathyroid adenoma 11/23/2020   Osteoporosis screening 11/16/2020   Memory loss 08/07/2020   Serum calcium elevated 04/12/2020   Hallux rigidus of left foot 04/12/2020   Hyperparathyroidism ([place]) 04/11/2020   Presbycusis of both ears 04/11/2020   No-show for appointment 03/08/2020   Pacemaker    Hypothyroidism 07/19/2019   Hypertension associated with diabetes ([place]) 07/11/2018   Hyperlipidemia associated with type 2 diabetes mellitus ([place]) 07/11/2018   Short bowel syndrome 07/11/2018   Status post left knee replacement 07/11/2018   Major depression in remission ([place]) 07/07/2018   Coronary artery disease involving native coronary artery of native heart without angina pectoris 07/07/2018   Paroxysmal atrial fibrillation ([place]) 05/11/2018   Age-related cognitive decline 04/30/2018   Gastroesophageal reflux disease without esophagitis 04/15/2017   B12 deficiency 12/13/2016   Tubular adenoma of colon 03/09/2016   Vitamin D deficiency 03/09/2016   S/P bariatric surgery-duodenal switch with sleeve gastrectomy 12/04/2013   Morbid obesity ([place]) 08/26/2013   Iron deficiency anemia 06/19/2012   Low back pain 03/05/2012   PSORIASIS 05/28/2010   Peripheral neuropathy 01/12/2009   Type 2 diabetes, controlled, with neuropathy ([place]) 02/18/2008   Obesity with sleep apnea 06/12/2007   Past Medical History:  Diagnosis Date   Advanced directives, counseling/discussion 04/12/2020   Allergy    Anemia    ANEMIA, PERNICIOUS, HX OF 05/13/2007   Anxiety    Arthritis    ASYMPTOMATIC POSTMENOPAUSAL STATUS 02/18/2008   B12 deficiency 12/13/2016   BACK PAIN, LUMBAR 11/19/2007   Blood in urine 07/19/2019   Bowel incontinence    Cataract    Cellulitis of left lower leg 10/28/2013   Cellulitis of leg, right 10/11/2010   Chronic depression 10/06/2007   Chronic dermatitis of hands 05/02/2009   Coronary artery disease involving native coronary artery of native heart without angina pectoris  07/07/2018   Last Assessment & Plan:  Formatting of this note might be different from the original. The patient reports remote cardiac catheterization with up to 40% plaque.  Subsequent Cardiolite stress test in October 2019 did not show any evidence of active ischemia or prior infarction. Formatting of this note might be different from the original. medical  Last Assessment & Plan:  Formatting of this note mi   DEPRESSION, CHRONIC 10/06/2007   Diabetes mellitus without complication ([place])    DIABETES MELLITUS, WITH NEUROLOGICAL COMPLICATIONS [id number]   Diabetic neuropathy ([place])    DYSLIPIDEMIA 05/13/2007   Dyslipidemia 04/15/2017   Last Assessment & Plan:  The patient will continue atorvastatin for  her dyslipidemia.   Dyslipidemia associated with type 2 diabetes mellitus ([place]) 07/11/2018   Essential hypertension 03/20/2010   Family history of colon cancer    Fecal incontinence 08/02/2015   Frozen shoulder    Lt   Full dentures    Gastroesophageal reflux disease without esophagitis 04/15/2017   Generalized abdominal pain 02/05/2018   Genetic testing 11/24/2017   Negative genetic testing on the common hereditary cancer panel.  The Hereditary Gene Panel offered by [facility] includes sequencing and/or deletion duplication testing of the following 47 genes: APC, ATM, AXIN2, BARD1, BMPR1A, BRCA1, BRCA2, BRIP1, CDH1, CDK4, CDKN2A (p14ARF), CDKN2A (p16INK4a), CHEK2, CTNNA1, DICER1, EPCAM (Deletion/duplication testing only), GREM1 (promoter region deletion/duplicat   GERD (gastroesophageal reflux disease)    GI bleed 03/15/2016   GOITER, MULTINODULAR 05/13/2007   GOITER, MULTINODULAR 05/13/2007    (last TSH 3.26; US soft tissue neck in 09/2004 showed multinodular goiter with specific small nodule for which was recommended to be followed by repeat US in 3-6 months 12/2016: Thyroid US: recommend repeat in 1 year (one nodule 1.7cm on left thyroid)    Headache    History of blood transfusion    Hx of colonic polyps  12/04/2010   HYPERCHOLESTEROLEMIA 03/20/2010   Hyperparathyroidism ([place]) 04/11/2020   HYPERTENSION 03/20/2010   Hypertension associated with diabetes ([place]) 07/11/2018   Hypothyroidism 07/19/2019   Iron deficiency anemia 06/19/2012   Low back pain 03/05/2012   Completed PT-notes say very motivated and had good progress.   CT (03/2020: Mild curvature convex to the right in the upper lumbar region into left in lower lumbar region. No antero or retrolisthesis in the supine position. retrolisthesis at L3-4 of 3 mm with flexion which increases to 6 mm with neutral and extension. Multifactorial spinal stenosis at L3-4 due to circumferential protrusion of the    Lower back pain    Major depression in remission ([place]) 07/07/2018   Last Assessment & Plan:  Mood has seemed good on sertraline.   Malabsorption 03/12/2016   NASH (nonalcoholic steatohepatitis)    Nausea 07/19/2019   Need for immunization against influenza 04/12/2020   No-show for appointment 03/08/2020   Nonobstructive CAD  08/26/2013   The patient has prior cardiac workup in 2006 when she underwent a stress test for abnormal EKG. The stress test was nonconclusive and she underwent cardiac catheterization that showed 30% stenosis in the LAD in 25% stenosis in RCA she was treated medically.     OA (osteoarthritis)    OBSTRUCTIVE SLEEP APNEA 06/12/2007   uses CPAP.   Pacemaker    Paroxysmal atrial fibrillation ([place]) 05/11/2018   Last Assessment & Plan:  Patient was noted to have episodes of paroxysmal atrial fibrillation that was predominantly rate controlled and at a relatively low burden.  She is chronically on anticoagulation now with Eliquis.  She does not take any negative chronotropic agents with her sinus bradycardia.  If she has any breakthrough more prolonged tachycardic episodes and requires negative chronotropi   Peripheral autonomic neuropathy due to diabetes mellitus ([place]) 07/11/2018   PERIPHERAL NEUROPATHY 01/12/2009   Peripheral neuropathy  01/12/2009   Peripheral vascular disease ([place])    Presbycusis of both ears 04/11/2020   Primary osteoarthritis involving multiple joints 07/07/2018   Last Assessment & Plan:  Post op left tka and here for rehab as her husband is handicapped so she has limited help at home. Consitpation noted, some pain overnight, better with a pillow under her knee.   Primary osteoarthritis of left knee  04/23/2011   Previously seen by Dr. Alphonzo Severance. Will plan on repeat referral after trial injection.      PSORIASIS 05/28/2010   Rectal prolapse 05/24/2015   Rectovaginal fistula 05/25/2014   Renal insufficiency    stage 1 kd   S/P bariatric surgery-duodenal switch with sleeve gastrectomy 12/04/2013   S/P total knee arthroplasty, right 11/16/2019   Screening mammogram, encounter for 07/12/2019   Second degree AV block 02/08/2020   Serum calcium elevated 04/12/2020   Short bowel syndrome 07/11/2018   Spigelian hernia 02/05/2018   Status post left knee replacement 07/11/2018   Stress 05/24/2015   Surgical counseling visit 10/02/2019   Tubular adenoma of colon 03/09/2016   Colonoscopy in 10/2015: 3  tubular adenomas; GI recommends repeat colonoscopy in 3 years Colonoscopy August 2012: 3 tubular adenomas.    Type 2 diabetes, controlled, with neuropathy ([place]) 99991111   Umbilical hernia without obstruction and without gangrene 04/15/2017   Unilateral primary osteoarthritis, left knee    UNSPECIFIED VENOUS INSUFFICIENCY 05/28/2010   Patient with frequent ulceration related to venous stasis-seen at wound care. Edema and Venous stasis changes due to this . Echo 04/16/11 with EF 60%. No signs diastolic dysfunction.        Vaccination against Streptococcus pneumoniae within past 5 years 04/12/2020   Vaginal irritation 07/19/2019   Venous stasis dermatitis of right lower extremity 01/02/2018   Vitamin D deficiency 03/09/2016   Wears glasses     Family History  Problem Relation Age of Onset   Hyperlipidemia Father     Hypertension Father    Cirrhosis Father    Colon cancer Father 48       d. 63   Colon cancer Sister 62       d. 65   Bipolar disorder Sister    Aneurysm Mother        brain   Cerebral aneurysm Mother    Colon cancer Sister 51       d. 77   Bipolar disorder Sister    Cancer Paternal Aunt        NOS, ? colon   Congestive Heart Failure Maternal Grandmother    Drug abuse Neg Hx    CAD Neg Hx    Stomach cancer Neg Hx    Hyperparathyroidism Neg Hx    Hypercalcemia Neg Hx     Past Surgical History:  Procedure Laterality Date   CATARACT EXTRACTION W/ INTRAOCULAR LENS  IMPLANT, BILATERAL     COLONOSCOPY     ELECTROCARDIOGRAM  04/16/2006   EXAMINATION UNDER ANESTHESIA N/A 06/29/2014   Procedure: EXAM UNDER ANESTHESIA;  Surgeon: Janyth Contes, MD;  Location: [facility];  Service: Gynecology;  Laterality: N/A;   Exercise myoview  01/24/2005   FLEXIBLE SIGMOIDOSCOPY     GASTRIC BYPASS  11/09/2013   GASTRIC BYPASS     JOINT REPLACEMENT     KNEE ARTHROSCOPY  2003   right   LAPAROSCOPY N/A 03/12/2016   Procedure: LAPAROSCOPIC ANASTOMOSIS OF INTESTINE (ENTEROENTEROSTOMY);  Surgeon: Ladora Daniel, MD;  Location: [facility];  Service: General;  Laterality: N/A;   LEG SURGERY Left    metal and pins in lower left leg   mrsa Right    arm   MULTIPLE TOOTH EXTRACTIONS     PACEMAKER LEADLESS INSERTION N/A 02/08/2020   Procedure: PACEMAKER LEADLESS INSERTION;  Surgeon: Thompson Grayer, MD;  Location: [facility];  Service: Cardiovascular;  Laterality: N/A;   PARATHYROIDECTOMY Left 02/19/2021   Procedure: LEFT PARATHYROIDECTOMY  WITH FROZEN SECTION;  Surgeon: Izora Gala, MD;  Location: [facility];  Service: ENT;  Laterality: Left;   SHOULDER ARTHROSCOPY W/ ROTATOR CUFF REPAIR Right    stab phlebectomy  Right 02/10/2019   stab phlebectomy > 20 incisions right leg by [name] MD    TONSILLECTOMY     TOTAL KNEE ARTHROPLASTY Left 06/30/2018   Procedure: LEFT TOTAL KNEE ARTHROPLASTY;  Surgeon:  Meredith Pel, MD;  Location: [facility];  Service: Orthopedics;  Laterality: Left;   TOTAL KNEE ARTHROPLASTY Right 11/16/2019   Procedure: RIGHT TOTAL KNEE ARTHROPLASTY-CEMENTED;  Surgeon: Meredith Pel, MD;  Location: [facility];  Service: Orthopedics;  Laterality: Right;   VARICOSE VEIN SURGERY     Remotef   Social History   Occupational History   Occupation: Unemployed    Comment: disabled  Tobacco Use   Smoking status: Former    Packs/day: 2.00    Years: 24.00    Total pack years: 48.00    Types: Cigarettes    Start date: 06/24/1970    Quit date: 08/06/1994    Years since quitting: 28.0   Smokeless tobacco: Never  Vaping Use   Vaping Use: Never used  Substance and Sexual Activity   Alcohol use: No    Alcohol/week: 0.0 standard drinks of alcohol   Drug use: No   Sexual activity: Not Currently

## 2022-08-07 ENCOUNTER — Ambulatory Visit: Payer: Self-pay

## 2022-08-07 ENCOUNTER — Ambulatory Visit (INDEPENDENT_AMBULATORY_CARE_PROVIDER_SITE_OTHER): Payer: Medicare Other | Admitting: Physical Medicine and Rehabilitation

## 2022-08-07 VITALS — BP 123/71 | HR 73

## 2022-08-07 DIAGNOSIS — M5416 Radiculopathy, lumbar region: Secondary | ICD-10-CM | POA: Diagnosis not present

## 2022-08-07 MED ORDER — DEXAMETHASONE SODIUM PHOSPHATE 10 MG/ML IJ SOLN
15.0000 mg | Freq: Once | INTRAMUSCULAR | Status: AC
  Administered 2022-08-07: 15 mg

## 2022-08-07 NOTE — Progress Notes (Signed)
Functional Pain Scale - descriptive words and definitions  Distressing (6)    Pain is present/unable to complete most ADLs limited by pain/sleep is difficult and active distraction is only marginal. Moderate range order  Average Pain  varies   +Driver, -BT, -Dye Allergies.  Lower back pain on both sides with radiation in both leg to the calves. Pain is worse in the mornings

## 2022-08-07 NOTE — Patient Instructions (Signed)

## 2022-08-08 ENCOUNTER — Ambulatory Visit (INDEPENDENT_AMBULATORY_CARE_PROVIDER_SITE_OTHER): Payer: Medicare Other

## 2022-08-08 DIAGNOSIS — I441 Atrioventricular block, second degree: Secondary | ICD-10-CM

## 2022-08-08 LAB — CUP PACEART REMOTE DEVICE CHECK
Battery Remaining Longevity: 96 mo
Battery Voltage: 3.02 V
Brady Statistic RV Percent Paced: 19.07 %
Date Time Interrogation Session: 20240215133700
Implantable Pulse Generator Implant Date: 20210817
Lead Channel Impedance Value: 470 Ohm
Lead Channel Pacing Threshold Amplitude: 0.5 V
Lead Channel Pacing Threshold Pulse Width: 0.4 ms
Lead Channel Sensing Intrinsic Amplitude: 18.9 mV
Lead Channel Setting Pacing Amplitude: 1 V
Lead Channel Setting Pacing Pulse Width: 0.4 ms
Lead Channel Setting Sensing Sensitivity: 2 mV

## 2022-08-20 ENCOUNTER — Other Ambulatory Visit: Payer: Self-pay | Admitting: Family Medicine

## 2022-08-20 DIAGNOSIS — R4586 Emotional lability: Secondary | ICD-10-CM

## 2022-08-20 NOTE — Procedures (Signed)
Lumbosacral Transforaminal Epidural Steroid Injection - Sub-Pedicular Approach with Fluoroscopic Guidance  Patient: [name]      Date of Birth: 08/02/1955 MRN: [id number] PCP: Donney Dice, DO      Visit Date: 08/07/2022   Universal Protocol:    Date/Time: 08/07/2022  Consent Given By: the patient  Position: PRONE  Additional Comments: Vital signs were monitored before and after the procedure. Patient was prepped and draped in the usual sterile fashion. The correct patient, procedure, and site was verified.   Injection Procedure Details:   Procedure diagnoses: Lumbar radiculopathy [M54.16]    Meds Administered:  Meds ordered this encounter  Medications   dexamethasone (DECADRON) injection 15 mg    Laterality: Bilateral  Location/Site: L3  Needle:5.0 in., 22 ga.  Short bevel or Quincke spinal needle  Needle Placement: Transforaminal  Findings:    -Comments: Excellent flow of contrast along the nerve, nerve root and into the epidural space.  Procedure Details: After squaring off the end-plates to get a true AP view, the C-arm was positioned so that an oblique view of the foramen as noted above was visualized. The target area is just inferior to the "nose of the scotty dog" or sub pedicular. The soft tissues overlying this structure were infiltrated with 2-3 ml. of 1% Lidocaine without Epinephrine.  The spinal needle was inserted toward the target using a "trajectory" view along the fluoroscope beam.  Under AP and lateral visualization, the needle was advanced so it did not puncture dura and was located close the 6 O'Clock position of the pedical in AP tracterory. Biplanar projections were used to confirm position. Aspiration was confirmed to be negative for CSF and/or blood. A 1-2 ml. volume of Isovue-250 was injected and flow of contrast was noted at each level. Radiographs were obtained for documentation purposes.   After attaining the desired flow of contrast  documented above, a 0.5 to 1.0 ml test dose of 0.25% Marcaine was injected into each respective transforaminal space.  The patient was observed for 90 seconds post injection.  After no sensory deficits were reported, and normal lower extremity motor function was noted,   the above injectate was administered so that equal amounts of the injectate were placed at each foramen (level) into the transforaminal epidural space.   Additional Comments:  No complications occurred Dressing: 2 x 2 sterile gauze and Band-Aid    Post-procedure details: Patient was observed during the procedure. Post-procedure instructions were reviewed.  Patient left the clinic in stable condition.

## 2022-08-20 NOTE — Progress Notes (Signed)
[name] - 67 y.o. female MRN [id number]  Date of birth: 08-Feb-1956  Office Visit Note: Visit Date: 08/07/2022 PCP: Donney Dice, DO Referred by: Callie Fielding, MD  Subjective: Chief Complaint  Patient presents with   Lower Back - Pain   HPI:  [name] is a 67 y.o. female who comes in today at the request of Dr. Ileene Rubens for planned Bilateral L3-4 Lumbar Transforaminal epidural steroid injection with fluoroscopic guidance.  The patient has failed conservative care including home exercise, medications, time and activity modification.  This injection will be diagnostic and hopefully therapeutic.  Please see requesting physician notes for further details and justification.   ROS Otherwise per HPI.  Assessment & Plan: Visit Diagnoses:    ICD-10-CM   1. Lumbar radiculopathy  M54.16 XR C-ARM NO REPORT    Epidural Steroid injection    dexamethasone (DECADRON) injection 15 mg      Plan: No additional findings.   Meds & Orders:  Meds ordered this encounter  Medications   dexamethasone (DECADRON) injection 15 mg    Orders Placed This Encounter  Procedures   XR C-ARM NO REPORT   Epidural Steroid injection    Follow-up: Return for visit to requesting provider as needed.   Procedures: No procedures performed  Lumbosacral Transforaminal Epidural Steroid Injection - Sub-Pedicular Approach with Fluoroscopic Guidance  Patient: [name]      Date of Birth: August 02, 1955 MRN: [id number] PCP: Donney Dice, DO      Visit Date: 08/07/2022   Universal Protocol:    Date/Time: 08/07/2022  Consent Given By: the patient  Position: PRONE  Additional Comments: Vital signs were monitored before and after the procedure. Patient was prepped and draped in the usual sterile fashion. The correct patient, procedure, and site was verified.   Injection Procedure Details:   Procedure diagnoses: Lumbar radiculopathy [M54.16]    Meds Administered:  Meds ordered this  encounter  Medications   dexamethasone (DECADRON) injection 15 mg    Laterality: Bilateral  Location/Site: L3  Needle:5.0 in., 22 ga.  Short bevel or Quincke spinal needle  Needle Placement: Transforaminal  Findings:    -Comments: Excellent flow of contrast along the nerve, nerve root and into the epidural space.  Procedure Details: After squaring off the end-plates to get a true AP view, the C-arm was positioned so that an oblique view of the foramen as noted above was visualized. The target area is just inferior to the "nose of the scotty dog" or sub pedicular. The soft tissues overlying this structure were infiltrated with 2-3 ml. of 1% Lidocaine without Epinephrine.  The spinal needle was inserted toward the target using a "trajectory" view along the fluoroscope beam.  Under AP and lateral visualization, the needle was advanced so it did not puncture dura and was located close the 6 O'Clock position of the pedical in AP tracterory. Biplanar projections were used to confirm position. Aspiration was confirmed to be negative for CSF and/or blood. A 1-2 ml. volume of Isovue-250 was injected and flow of contrast was noted at each level. Radiographs were obtained for documentation purposes.   After attaining the desired flow of contrast documented above, a 0.5 to 1.0 ml test dose of 0.25% Marcaine was injected into each respective transforaminal space.  The patient was observed for 90 seconds post injection.  After no sensory deficits were reported, and normal lower extremity motor function was noted,   the above injectate was administered so that equal  amounts of the injectate were placed at each foramen (level) into the transforaminal epidural space.   Additional Comments:  No complications occurred Dressing: 2 x 2 sterile gauze and Band-Aid    Post-procedure details: Patient was observed during the procedure. Post-procedure instructions were reviewed.  Patient left the clinic in  stable condition.    Clinical History: No specialty comments available.     Objective:  VS:  HT:    WT:   BMI:     BP:123/71  HR:73bpm  TEMP: ( )  RESP:  Physical Exam Vitals and nursing note reviewed.  Constitutional:      General: She is not in acute distress.    Appearance: Normal appearance. She is obese. She is not ill-appearing.  HENT:     Head: Normocephalic and atraumatic.     Right Ear: External ear normal.     Left Ear: External ear normal.  Eyes:     Extraocular Movements: Extraocular movements intact.  Cardiovascular:     Rate and Rhythm: Normal rate.     Pulses: Normal pulses.  Pulmonary:     Effort: Pulmonary effort is normal. No respiratory distress.  Abdominal:     General: There is no distension.     Palpations: Abdomen is soft.  Musculoskeletal:        General: Tenderness present.     Cervical back: Neck supple.     Right lower leg: No edema.     Left lower leg: No edema.     Comments: Patient has good distal strength with no pain over the greater trochanters.  No clonus or focal weakness.  Skin:    Findings: No erythema, lesion or rash.  Neurological:     General: No focal deficit present.     Mental Status: She is alert and oriented to person, place, and time.     Sensory: No sensory deficit.     Motor: No weakness or abnormal muscle tone.     Coordination: Coordination normal.  Psychiatric:        Mood and Affect: Mood normal.        Behavior: Behavior normal.      Imaging: No results found.

## 2022-08-27 DIAGNOSIS — Z961 Presence of intraocular lens: Secondary | ICD-10-CM | POA: Diagnosis not present

## 2022-08-27 DIAGNOSIS — E113313 Type 2 diabetes mellitus with moderate nonproliferative diabetic retinopathy with macular edema, bilateral: Secondary | ICD-10-CM | POA: Diagnosis not present

## 2022-08-27 DIAGNOSIS — H35033 Hypertensive retinopathy, bilateral: Secondary | ICD-10-CM | POA: Diagnosis not present

## 2022-08-27 DIAGNOSIS — H43823 Vitreomacular adhesion, bilateral: Secondary | ICD-10-CM | POA: Diagnosis not present

## 2022-08-30 ENCOUNTER — Ambulatory Visit (INDEPENDENT_AMBULATORY_CARE_PROVIDER_SITE_OTHER): Payer: Medicare Other | Admitting: Orthopedic Surgery

## 2022-08-30 DIAGNOSIS — M48062 Spinal stenosis, lumbar region with neurogenic claudication: Secondary | ICD-10-CM

## 2022-08-31 NOTE — Progress Notes (Addendum)
Orthopedic Spine Surgery Office Note  Assessment: Patient is a 67 y.o. female with low back pain that radiates into bilateral legs consistent with neurogenic claudication. She has L3/4 central, lateral recess, and foraminal stenosis. She has also developed posterior leg pain. Has lateral recess stenosis at L4/5.   Plan: -Explained that initially conservative treatment is tried as a significant number of patients may experience relief with these treatment modalities. Discussed that the conservative treatments include:  -activity modification  -physical therapy  -over the counter pain medications  -medrol dosepak  -lumbar steroid injections -Patient has tried physical therapy, activity modification, over-the-counter pain medications, lumbar injection -Since she has tried conservative treatments without any lasting relief, I discussed L3-5 laminectomy as a treatment option. I covered the risks, benefits, and alternatives. She wanted to talk things over with her family after this discussion so no decision was made today -Patient should return to office in 4 weeks, x-rays at next visit: none  ___________________________________________________________________________  History: Patient is a 67 y.o. female who has been previously seen in the office for symptoms consistent with neurogenic claudication. She has pain in her low back that radiates into her bilateral anterior thigh that is worse with upright activity and improves when she sits down or flexes her lumbar spine. Since I last saw her, she also has developed a more constant pain radiating down the back of her thighs and legs. She has not had any other symptom changes. She got an injection that she said helped maybe 25-30%.   Previous treatments: Physical therapy, activity modification, over-the-counter pain medications    Physical Exam:  General: no acute distress, appears stated age Neurologic: alert, answering questions appropriately,  following commands Respiratory: unlabored breathing on room air, symmetric chest rise Psychiatric: appropriate affect, normal cadence to speech   MSK (spine):  -Strength exam      Left  Right EHL    4/5  4/5 TA    5/5  5/5 GSC    5/5  5/5 Knee extension  5/5  5/5 Hip flexion   5/5  5/5  -Sensory exam    Sensation intact to light touch in L3-S1 nerve distributions of bilateral lower extremities  -Achilles DTR: 1/4 on the left, 1/4 on the right -Patellar tendon DTR: 1/4 on the left, 1/4 on the right  -Straight leg raise: negative bilaterally -Femoral nerve stretch test: negative bilaterally -Clonus: no beats bilaterally  Imaging: XR of the lumbar spine from 05/21/2022 was previously independently reviewed and interpreted, showing retrolisthesis of L3 on L4. Disc height loss at multiple levels. Lumbar scoliosis with apex to the right. No fracture or dislocation. No evidence of instability on flexion/extension.     MRI of the lumbar spine from 07/18/2022 was independently reviewed and interpreted, showing DDD at L2/3, L3/4, L4/5.  Modic changes at L2/3 and L4/5.  Central, lateral recess, and foraminal (L>R) stenosis at L3/4.  Lateral recess stenosis at L4/5.   Patient name: [name] Patient MRN: [id number] Date of visit: 08/31/22

## 2022-09-02 ENCOUNTER — Other Ambulatory Visit: Payer: Self-pay | Admitting: Pharmacist Clinician (PhC)/ Clinical Pharmacy Specialist

## 2022-09-02 DIAGNOSIS — I480 Paroxysmal atrial fibrillation: Secondary | ICD-10-CM

## 2022-09-02 MED ORDER — APIXABAN 5 MG PO TABS: ORAL_TABLET | ORAL | 0 refills | Status: DC

## 2022-09-02 NOTE — Telephone Encounter (Signed)
Refill x 3 mo, past due for OV

## 2022-09-04 ENCOUNTER — Other Ambulatory Visit: Payer: Self-pay

## 2022-09-04 MED ORDER — SPIRONOLACTONE 25 MG PO TABS: ORAL_TABLET | ORAL | 0 refills | Status: DC

## 2022-09-04 NOTE — Progress Notes (Signed)
Remote pacemaker transmission.   

## 2022-09-18 ENCOUNTER — Ambulatory Visit (INDEPENDENT_AMBULATORY_CARE_PROVIDER_SITE_OTHER): Payer: Medicare Other | Admitting: Family Medicine

## 2022-09-18 ENCOUNTER — Encounter: Payer: Self-pay | Admitting: Family Medicine

## 2022-09-18 ENCOUNTER — Other Ambulatory Visit: Payer: Self-pay

## 2022-09-18 VITALS — BP 126/66 | HR 62 | Ht 67.0 in | Wt 183.8 lb

## 2022-09-18 DIAGNOSIS — R4586 Emotional lability: Secondary | ICD-10-CM | POA: Diagnosis not present

## 2022-09-18 NOTE — Patient Instructions (Signed)
It was great seeing you today!  Today we discussed many things. Continue taking all your medications as prescribed. I am very proud of all the progress that you have made.   Please follow up at your next scheduled appointment in 1 month, if anything arises between now and then, please don't hesitate to contact our office.   Thank you for allowing [place] to be a part of your medical care!  Thank you, Dr. Larae Grooms  Also a reminder of our clinic's no-show policy. Please make sure to arrive at least 15 minutes prior to your scheduled appointment time. Please try to cancel before 24 hours if you are not able to make it. If you no-show for 2 appointments then you will be receiving a warning letter. If you no-show after 3 visits, then you may be at risk of being dismissed from our clinic. This is to ensure that everyone is able to be seen in a timely manner. Thank you, we appreciate your assistance with this!

## 2022-09-18 NOTE — Assessment & Plan Note (Addendum)
-  seems to be improving, likely secondary to ongoing personal stressors  -reassurance provided and coping strategies further discussed -continue zoloft and wellbutrin -follow up in 1 month for mood check and next A1c

## 2022-09-18 NOTE — Assessment & Plan Note (Signed)
-  chronic issue but seems to be continuing to be bothersome, after not having adequate relief with conservative measures, it seems that patient would benefit from laminectomy which was discussed by orthopedics -we discussed getting more home health aid for husband so that she will be able to appropriately get time to rest during recovery from surgery which she says that her husband is on the list to get this  -follow up with ortho as appropriate for management, potential upcoming surgery

## 2022-09-18 NOTE — Progress Notes (Signed)
    SUBJECTIVE:   CHIEF COMPLAINT / HPI:   Patient presents for follow up. Feeling better. Saw orthopedics for her chronic back pain secondary to lumbar stenosis which imaging was consistent with per orthopedics (although I am not able to access this imaging). Recommended to initially start off by trying conservative including physical therapy and OTC medications. She has started steroid injections once and might have then every 3 months which she feels have not provided her with any improvement in her pain symptoms. Recommended potential L3-L5 laminectomy. 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   Reports that mood continues to be better but it is difficult as she has ongoing stressors such as being a caregiver and running household errands. Daughter also helps out a lot which gives her relief during those times.   OBJECTIVE:   BP 126/66   Pulse 62   Ht 5\' 7"  (1.702 m)   Wt 183 lb 12.8 oz (83.4 kg)   SpO2 100%   BMI 28.79 kg/m   General: Patient well-appearing, in no acute distress. CV: RRR, no murmurs or gallops auscultated Resp: CTAB, no wheezing, rales or rhonchi noted Ext: no LE edema noted bilaterally Neuro: normal gait  Psych: mood appropriate, very pleasant   ASSESSMENT/PLAN:   Low back pain -chronic issue but seems to be continuing to be bothersome, after not having adequate relief with conservative measures, it seems that patient would benefit from laminectomy which was discussed by orthopedics -we discussed getting more home health aid for husband so that she will be able to appropriately get time to rest during recovery from surgery which she says that her husband is on the list to get this  -follow up with ortho as appropriate for management, potential upcoming surgery  Mood changes -seems to be improving, likely secondary to ongoing personal stressors  -reassurance provided and coping strategies further  discussed -continue zoloft and wellbutrin -follow up in 1 month for mood check and next A1c    Kaio Kuhlman Larae Grooms, [address]

## 2022-09-27 ENCOUNTER — Ambulatory Visit (INDEPENDENT_AMBULATORY_CARE_PROVIDER_SITE_OTHER): Payer: Medicare Other | Admitting: Orthopedic Surgery

## 2022-09-27 DIAGNOSIS — M48062 Spinal stenosis, lumbar region with neurogenic claudication: Secondary | ICD-10-CM

## 2022-09-27 NOTE — Progress Notes (Signed)
Orthopedic Spine Surgery Office Note  Assessment: Patient is a 67 y.o. female with low back pain that radiates into bilateral lower extremities consistent with neurogenic claudication.  MRI shows central stenosis at L3/4 and lateral recess stenosis at L4/5.   Plan: -Patient has tried conservative treatments for over 6 months now without any significant or lasting relief, so discussed operative management as an option for her.  Specifically discussed laminectomies with foraminotomies from L3 to L5.  Patient wanted to proceed with surgery. -Patient will next be seen at the date of surgery   The patient has symptoms consistent with neurogenic claudication. The patient's symptoms did not improve with conservative treatment so operative management was discussed in the form of L3, L4, L5 segment laminectomies and bilateral foraminotomies. The risks including but not limited to iatrogenic instability, dural tear, nerve root injury, paralysis, persistent pain, infection, bleeding, heart attack, death, stroke, fracture, and need for additional procedures were discussed with the patient. The benefit of the surgery would be improvement in the patient's bilateral leg pain. I explained that back pain relief is not the goal of the surgery and it is not reliably alleviated with this surgery. The alternatives to surgical management were covered with the patient and included continued monitoring, physical therapy, over-the-counter pain medications, ambulatory aids, injections, and activity modification. All the patient's questions were answered to her satisfaction. After this discussion, the patient expressed understanding and elected to proceed with surgical intervention.     ___________________________________________________________________________  History:  Patient is a 67 y.o. female who has been previously seen in the office for symptoms low back pain that radiates into her bilateral anterior thighs that is  worse with activity and improves with flexion of the lumbar spine or seated position.  She also has pain that radiates down the posterior aspect of her thigh and buttock.  This is more constant in nature.  It is more significant on the right side.  She has been feeling this pain on a daily basis.  She has tried multiple conservative treatments but has not gotten any lasting relief with those treatments.  Since the last time, I saw her she has talked things over with her family and wants to proceed with surgery.  No saddle anesthesia.  No bowel or bladder incontinence.  Has bilateral numbness and tingling in the feet from diabetic neuropathy.  No changes in her numbness or tingling in either lower extremity.  Previous treatments:  physical therapy, activity modification, over-the-counter pain medications, lumbar steroid injection     Physical Exam:  BMI of 23.1 Last A1c was 5.7 on 06/28/2022  General: no acute distress, appears stated age Neurologic: alert, answering questions appropriately, following commands Respiratory: unlabored breathing on room air, symmetric chest rise Psychiatric: appropriate affect, normal cadence to speech Vascular: palpable dorsalis pedis pulses bilaterally, feet warm and well perfused   MSK (spine):  -Strength exam      Left  Right EHL    4/5  4/5 TA    5/5  4/5 GSC    5/5  5/5 Knee extension  5/5  5/5 Hip flexion   5/5  5/5  -Sensory exam    Sensation intact to light touch in L3-S1 nerve distributions of bilateral lower extremities  -Achilles DTR: 1/4 on the left, 1/4 on the right -Patellar tendon DTR: 1/4 on the left, 1/4 on the right  -Straight leg raise: negative bilaterally -Clonus: no beats bilaterally -Palpable DP pulse bilaterally  -Left hip exam: no pain through range  of motion, negative stinchfield, negative faber -Right hip exam: no pain through range of motion, negative stinchfield, negative faber   Imaging: XR of the lumbar spine from  05/21/2022 was previously independently reviewed and interpreted, showing retrolisthesis of L3 on L4. Disc height loss at multiple levels. Lumbar scoliosis with apex to the right. No fracture or dislocation. No evidence of instability on flexion/extension.     MRI of the lumbar spine from 07/18/2022 was previously independently reviewed and interpreted, showing DDD at L2/3, L3/4, L4/5.  Modic changes at L2/3 and L4/5.  Central, lateral recess, and foraminal (L>R) stenosis at L3/4.  Lateral recess stenosis at L4/5.   Patient name: [name] GunningDebra L Garza Patient MRN: [id number] Date of visit: 09/27/22

## 2022-09-27 NOTE — Progress Notes (Signed)
Pre-operative Scores  ODI: 22 VAS back: 6 VAS leg: 5 SF-36:  -Physical functioning: 20  -Role limitations due to physical health: 0  -Role limitations due to emotional health: 33.3  -Energy/fatigue: 45  -Emotional well-being: 80   -Social functioning: 62.5  -Pain: 45  -General health: 67  London Sheer, MD Orthopedic Surgeon

## 2022-09-30 ENCOUNTER — Other Ambulatory Visit: Payer: Self-pay | Admitting: Family Medicine

## 2022-09-30 ENCOUNTER — Other Ambulatory Visit: Payer: Self-pay

## 2022-09-30 DIAGNOSIS — R4586 Emotional lability: Secondary | ICD-10-CM

## 2022-09-30 MED ORDER — LISINOPRIL-HYDROCHLOROTHIAZIDE 20-25 MG PO TABS: 1.0000 | ORAL_TABLET | Freq: Every day | ORAL | 3 refills | Status: DC

## 2022-10-07 ENCOUNTER — Ambulatory Visit (INDEPENDENT_AMBULATORY_CARE_PROVIDER_SITE_OTHER): Payer: Medicare Other | Admitting: Orthopedic Surgery

## 2022-10-07 DIAGNOSIS — M79672 Pain in left foot: Secondary | ICD-10-CM

## 2022-10-13 ENCOUNTER — Encounter: Payer: Self-pay | Admitting: Orthopedic Surgery

## 2022-10-13 NOTE — Progress Notes (Signed)
Office Visit Note   Patient: [name]           Date of Birth: [birth year]           MRN: [id number] Visit Date: 10/07/2022              Requested by: Reece Leader, DO [address] [place],  [place] [address] PCP: Reece Leader, DO  Chief Complaint  Patient presents with   Left Foot - Pain      HPI: Patient is a 67 year old woman who presents with lateral left foot pain.  Patient has a history of venous stasis insufficiency and plantar calluses.  She is status post radiographs February 12.  Patient denies any recent trauma.  Assessment & Plan: Visit Diagnoses:  1. Pain in left foot     Plan: The hyperkeratotic lesions were pared.  Recommended Hoka sneakers.  Follow-Up Instructions: Return if symptoms worsen or fail to improve.   Ortho Exam  Patient is alert, oriented, no adenopathy, well-dressed, normal affect, normal respiratory effort. Examination patient has venous stasis changes without ulcers.  There is a hyperkeratotic lesion on the left heel this was pared without complications.  She does have hallux rigidus of the great toe with dorsiflexion is 0 degrees.  She has a palpable dorsal spur she states that she did have surgery on this years ago.  Imaging: No results found. No images are attached to the encounter.  Labs: Lab Results  Component Value Date   HGBA1C 5.7 06/28/2022   HGBA1C 5.8 (A) 04/17/2022   HGBA1C 6.9 11/23/2021   ESRSEDRATE 62 (H) 09/28/2007   ESRSEDRATE 67 (H) 09/21/2007   ESRSEDRATE 86 (H) 09/17/2007   REPTSTATUS 08/07/2020 FINAL 08/04/2020   GRAMSTAIN  01/07/2012    NO WBC SEEN NO SQUAMOUS EPITHELIAL CELLS SEEN NO ORGANISMS SEEN   CULT  08/04/2020    NO GROUP A STREP (S.PYOGENES) ISOLATED Performed at [facility], [address] [place], [place] [address]    [facility] STAPHYLOCOCCUS AUREUS 01/07/2012     Lab Results  Component Value Date   ALBUMIN 4.5 11/05/2021   ALBUMIN 3.9 02/15/2021   ALBUMIN 4.5 11/16/2020    PREALBUMIN 20.4 05/25/2015    Lab Results  Component Value Date   MG 2.1 05/07/2017   MG 2.0 05/25/2015   MG 1.6 (L) 11/10/2013   Lab Results  Component Value Date   VD25OH 21.04 (L) 05/12/2020   VD25OH 20.4 (L) 04/29/2018   VD25OH 14.8 (L) 10/13/2017    Lab Results  Component Value Date   PREALBUMIN 20.4 05/25/2015      Latest Ref Rng & Units 11/05/2021    2:23 PM 02/15/2021    8:40 AM 09/07/2020    3:50 PM  CBC EXTENDED  WBC 3.4 - 10.8 x10E3/uL 4.8  6.4  5.6   RBC 3.77 - 5.28 x10E6/uL 4.79  4.92  5.09   Hemoglobin 11.1 - 15.9 g/dL 82.9  56.2  13.0   HCT 34.0 - 46.6 % 38.2  43.1  41.6   Platelets 150 - 450 x10E3/uL 238  221  254      There is no height or weight on file to calculate BMI.  Orders:  No orders of the defined types were placed in this encounter.  No orders of the defined types were placed in this encounter.    Procedures: No procedures performed  Clinical Data: No additional findings.  ROS:  All other systems negative, except as noted  in the HPI. Review of Systems  Objective: Vital Signs: There were no vitals taken for this visit.  Specialty Comments:  No specialty comments available.  PMFS History: Patient Active Problem List   Diagnosis Date Noted   Callus of foot 06/28/2022   Tremor 11/05/2021   Mood changes 07/17/2021   Neck muscle spasm 03/17/2021   Type 2 DM with diabetic neuropathy affecting both sides of body 01/09/2021   Right leg swelling 12/27/2020   Parathyroid adenoma 11/23/2020   Osteoporosis screening 11/16/2020   Memory loss 08/07/2020   Serum calcium elevated 04/12/2020   Hallux rigidus of left foot 04/12/2020   Hyperparathyroidism 04/11/2020   Presbycusis of both ears 04/11/2020   No-show for appointment 03/08/2020   Pacemaker    Hypothyroidism 07/19/2019   Hypertension associated with diabetes 07/11/2018   Hyperlipidemia associated with type 2 diabetes mellitus 07/11/2018   Short bowel syndrome 07/11/2018    Status post left knee replacement 07/11/2018   Major depression in remission 07/07/2018   Coronary artery disease involving native coronary artery of native heart without angina pectoris 07/07/2018   Paroxysmal atrial fibrillation 05/11/2018   Age-related cognitive decline 04/30/2018   Gastroesophageal reflux disease without esophagitis 04/15/2017   B12 deficiency 12/13/2016   Tubular adenoma of colon 03/09/2016   Vitamin D deficiency 03/09/2016   S/P bariatric surgery-duodenal switch with sleeve gastrectomy 12/04/2013   Morbid obesity (HCC) 08/26/2013   Iron deficiency anemia 06/19/2012   Low back pain 03/05/2012   PSORIASIS 05/28/2010   Peripheral neuropathy 01/12/2009   Type 2 diabetes, controlled, with neuropathy 02/18/2008   Obesity with sleep apnea 06/12/2007   Past Medical History:  Diagnosis Date   Advanced directives, counseling/discussion 04/12/2020   Allergy    Anemia    ANEMIA, PERNICIOUS, HX OF 05/13/2007   Anxiety    Arthritis    ASYMPTOMATIC POSTMENOPAUSAL STATUS 02/18/2008   B12 deficiency 12/13/2016   BACK PAIN, LUMBAR 11/19/2007   Blood in urine 07/19/2019   Bowel incontinence    Cataract    Cellulitis of left lower leg 10/28/2013   Cellulitis of leg, right 10/11/2010   Chronic depression 10/06/2007   Chronic dermatitis of hands 05/02/2009   Coronary artery disease involving native coronary artery of native heart without angina pectoris 07/07/2018   Last Assessment & Plan:  Formatting of this note might be different from the original. The patient reports remote cardiac catheterization with up to 40% plaque.  Subsequent Cardiolite stress test in October 2019 did not show any evidence of active ischemia or prior infarction. Formatting of this note might be different from the original. medical  Last Assessment & Plan:  Formatting of this note mi   DEPRESSION, CHRONIC 10/06/2007   Diabetes mellitus without complication    DIABETES MELLITUS, WITH NEUROLOGICAL COMPLICATIONS  02/18/2008   Diabetic neuropathy    DYSLIPIDEMIA 05/13/2007   Dyslipidemia 04/15/2017   Last Assessment & Plan:  The patient will continue atorvastatin for her dyslipidemia.   Dyslipidemia associated with type 2 diabetes mellitus 07/11/2018   Essential hypertension 03/20/2010   Family history of colon cancer    Fecal incontinence 08/02/2015   Frozen shoulder    Lt   Full dentures    Gastroesophageal reflux disease without esophagitis 04/15/2017   Generalized abdominal pain 02/05/2018   Genetic testing 11/24/2017   Negative genetic testing on the common hereditary cancer panel.  The Hereditary Gene Panel offered by [facility] includes sequencing and/or deletion duplication testing of the following 47 genes: APC,  ATM, AXIN2, BARD1, BMPR1A, BRCA1, BRCA2, BRIP1, CDH1, CDK4, CDKN2A (p14ARF), CDKN2A (p16INK4a), CHEK2, CTNNA1, DICER1, EPCAM (Deletion/duplication testing only), GREM1 (promoter region deletion/duplicat   GERD (gastroesophageal reflux disease)    GI bleed 03/15/2016   GOITER, MULTINODULAR 05/13/2007   GOITER, MULTINODULAR 05/13/2007    (last TSH 3.26; US soft tissue neck in 09/2004 showed multinodular goiter with specific small nodule for which was recommended to be followed by repeat US in 3-6 months 12/2016: Thyroid US: recommend repeat in 1 year (one nodule 1.7cm on left thyroid)    Headache    History of blood transfusion    Hx of colonic polyps 12/04/2010   HYPERCHOLESTEROLEMIA 03/20/2010   Hyperparathyroidism 04/11/2020   HYPERTENSION 03/20/2010   Hypertension associated with diabetes 07/11/2018   Hypothyroidism 07/19/2019   Iron deficiency anemia 06/19/2012   Low back pain 03/05/2012   Completed PT-notes say very motivated and had good progress.   CT (03/2020: Mild curvature convex to the right in the upper lumbar region into left in lower lumbar region. No antero or retrolisthesis in the supine position. retrolisthesis at L3-4 of 3 mm with flexion which increases to 6 mm with neutral and  extension. Multifactorial spinal stenosis at L3-4 due to circumferential protrusion of the    Lower back pain    Major depression in remission 07/07/2018   Last Assessment & Plan:  Mood has seemed good on sertraline.   Malabsorption 03/12/2016   NASH (nonalcoholic steatohepatitis)    Nausea 07/19/2019   Need for immunization against influenza 04/12/2020   No-show for appointment 03/08/2020   Nonobstructive CAD  08/26/2013   The patient has prior cardiac workup in 2006 when she underwent a stress test for abnormal EKG. The stress test was nonconclusive and she underwent cardiac catheterization that showed 30% stenosis in the LAD in 25% stenosis in RCA she was treated medically.     OA (osteoarthritis)    OBSTRUCTIVE SLEEP APNEA 06/12/2007   uses CPAP.   Pacemaker    Paroxysmal atrial fibrillation 05/11/2018   Last Assessment & Plan:  Patient was noted to have episodes of paroxysmal atrial fibrillation that was predominantly rate controlled and at a relatively low burden.  She is chronically on anticoagulation now with Eliquis.  She does not take any negative chronotropic agents with her sinus bradycardia.  If she has any breakthrough more prolonged tachycardic episodes and requires negative chronotropi   Peripheral autonomic neuropathy due to diabetes mellitus 07/11/2018   PERIPHERAL NEUROPATHY 01/12/2009   Peripheral neuropathy 01/12/2009   Peripheral vascular disease    Presbycusis of both ears 04/11/2020   Primary osteoarthritis involving multiple joints 07/07/2018   Last Assessment & Plan:  Post op left tka and here for rehab as her husband is handicapped so she has limited help at home. Consitpation noted, some pain overnight, better with a pillow under her knee.   Primary osteoarthritis of left knee 04/23/2011   Previously seen by Dr. Dorene Grebe. Will plan on repeat referral after trial injection.      PSORIASIS 05/28/2010   Rectal prolapse 05/24/2015   Rectovaginal fistula 05/25/2014   Renal  insufficiency    stage 1 kd   S/P bariatric surgery-duodenal switch with sleeve gastrectomy 12/04/2013   S/P total knee arthroplasty, right 11/16/2019   Screening mammogram, encounter for 07/12/2019   Second degree AV block 02/08/2020   Serum calcium elevated 04/12/2020   Short bowel syndrome 07/11/2018   Spigelian hernia 02/05/2018   Status post left knee replacement  07/11/2018   Stress 05/24/2015   Surgical counseling visit 10/02/2019   Tubular adenoma of colon 03/09/2016   Colonoscopy in 10/2015: 3  tubular adenomas; GI recommends repeat colonoscopy in 3 years Colonoscopy August 2012: 3 tubular adenomas.    Type 2 diabetes, controlled, with neuropathy 02/18/2008   Umbilical hernia without obstruction and without gangrene 04/15/2017   Unilateral primary osteoarthritis, left knee    UNSPECIFIED VENOUS INSUFFICIENCY 05/28/2010   Patient with frequent ulceration related to venous stasis-seen at wound care. Edema and Venous stasis changes due to this . Echo 04/16/11 with EF 60%. No signs diastolic dysfunction.        Vaccination against Streptococcus pneumoniae within past 5 years 04/12/2020   Vaginal irritation 07/19/2019   Venous stasis dermatitis of right lower extremity 01/02/2018   Vitamin D deficiency 03/09/2016   Wears glasses     Family History  Problem Relation Age of Onset   Hyperlipidemia Father    Hypertension Father    Cirrhosis Father    Colon cancer Father 60       d. 28   Colon cancer Sister 56       d. 70   Bipolar disorder Sister    Aneurysm Mother        brain   Cerebral aneurysm Mother    Colon cancer Sister 28       d. 44   Bipolar disorder Sister    Cancer Paternal Aunt        NOS, ? colon   Congestive Heart Failure Maternal Grandmother    Drug abuse Neg Hx    CAD Neg Hx    Stomach cancer Neg Hx    Hyperparathyroidism Neg Hx    Hypercalcemia Neg Hx     Past Surgical History:  Procedure Laterality Date   CATARACT EXTRACTION W/ INTRAOCULAR LENS  IMPLANT,  BILATERAL     COLONOSCOPY     ELECTROCARDIOGRAM  04/16/2006   EXAMINATION UNDER ANESTHESIA N/A 06/29/2014   Procedure: EXAM UNDER ANESTHESIA;  Surgeon: Sherian Rein, MD;  Location: [facility];  Service: Gynecology;  Laterality: N/A;   Exercise myoview  01/24/2005   FLEXIBLE SIGMOIDOSCOPY     GASTRIC BYPASS  11/09/2013   GASTRIC BYPASS     JOINT REPLACEMENT     KNEE ARTHROSCOPY  2003   right   LAPAROSCOPY N/A 03/12/2016   Procedure: LAPAROSCOPIC ANASTOMOSIS OF INTESTINE (ENTEROENTEROSTOMY);  Surgeon: Everette Rank, MD;  Location: [facility] ORS;  Service: General;  Laterality: N/A;   LEG SURGERY Left    metal and pins in lower left leg   mrsa Right    arm   MULTIPLE TOOTH EXTRACTIONS     PACEMAKER LEADLESS INSERTION N/A 02/08/2020   Procedure: PACEMAKER LEADLESS INSERTION;  Surgeon: Hillis Range, MD;  Location: [facility];  Service: Cardiovascular;  Laterality: N/A;   PARATHYROIDECTOMY Left 02/19/2021   Procedure: LEFT PARATHYROIDECTOMY WITH FROZEN SECTION;  Surgeon: Serena Colonel, MD;  Location: [facility] OR;  Service: ENT;  Laterality: Left;   SHOULDER ARTHROSCOPY W/ ROTATOR CUFF REPAIR Right    stab phlebectomy  Right 02/10/2019   stab phlebectomy > 20 incisions right leg by Fabienne Bruns MD    TONSILLECTOMY     TOTAL KNEE ARTHROPLASTY Left 06/30/2018   Procedure: LEFT TOTAL KNEE ARTHROPLASTY;  Surgeon: Cammy Copa, MD;  Location: [facility] OR;  Service: Orthopedics;  Laterality: Left;   TOTAL KNEE ARTHROPLASTY Right 11/16/2019   Procedure: RIGHT TOTAL KNEE ARTHROPLASTY-CEMENTED;  Surgeon: August Saucer,  Corrie Mckusick, MD;  Location: [facility] OR;  Service: Orthopedics;  Laterality: Right;   VARICOSE VEIN SURGERY     Remotef   Social History   Occupational History   Occupation: Unemployed    Comment: disabled  Tobacco Use   Smoking status: Former    Packs/day: 2.00    Years: 24.00    Additional pack years: 0.00    Total pack years: 48.00    Types: Cigarettes    Start date: 06/24/1970    Quit  date: 08/06/1994    Years since quitting: 28.2   Smokeless tobacco: Never  Vaping Use   Vaping Use: Never used  Substance and Sexual Activity   Alcohol use: No    Alcohol/week: 0.0 standard drinks of alcohol   Drug use: No   Sexual activity: Not Currently

## 2022-10-15 ENCOUNTER — Telehealth: Payer: Self-pay

## 2022-10-15 NOTE — Telephone Encounter (Signed)
Informed patient her ozempic pens are ready for pickup.  4 boxes of ozempic  dose pens are labeled and ready in med room fridge.

## 2022-10-17 ENCOUNTER — Ambulatory Visit (INDEPENDENT_AMBULATORY_CARE_PROVIDER_SITE_OTHER): Payer: Medicare Other | Admitting: Family Medicine

## 2022-10-17 ENCOUNTER — Encounter: Payer: Self-pay | Admitting: Family Medicine

## 2022-10-17 VITALS — BP 146/82 | HR 81 | Ht 67.0 in | Wt 177.8 lb

## 2022-10-17 DIAGNOSIS — Z1231 Encounter for screening mammogram for malignant neoplasm of breast: Secondary | ICD-10-CM

## 2022-10-17 DIAGNOSIS — E1159 Type 2 diabetes mellitus with other circulatory complications: Secondary | ICD-10-CM | POA: Diagnosis not present

## 2022-10-17 DIAGNOSIS — R4586 Emotional lability: Secondary | ICD-10-CM

## 2022-10-17 DIAGNOSIS — E1140 Type 2 diabetes mellitus with diabetic neuropathy, unspecified: Secondary | ICD-10-CM

## 2022-10-17 DIAGNOSIS — I152 Hypertension secondary to endocrine disorders: Secondary | ICD-10-CM | POA: Diagnosis not present

## 2022-10-17 LAB — POCT GLYCOSYLATED HEMOGLOBIN (HGB A1C): HbA1c, POC (controlled diabetic range): 5.3 % (ref 0.0–7.0)

## 2022-10-17 NOTE — Patient Instructions (Addendum)
It was great seeing you today!  Today we discussed many things. Please continue to take all your medications. Good luck with your surgery! Your A1c is pending, I will let you know when it results.   I have ordered your mammogram, please call to schedule a time that is convenient for you to have this done. As we discussed, it is important to have regular breast cancer screening.   Please follow up at your next scheduled appointment on 5/30 at 10:30 am, if anything arises between now and then, please don't hesitate to contact our office.   Thank you for allowing [place] to be a part of your medical care!  Thank you, Dr. Robyne Peers  Also a reminder of our clinic's no-show policy. Please make sure to arrive at least 15 minutes prior to your scheduled appointment time. Please try to cancel before 24 hours if you are not able to make it. If you no-show for 2 appointments then you will be receiving a warning letter. If you no-show after 3 visits, then you may be at risk of being dismissed from our clinic. This is to ensure that everyone is able to be seen in a timely manner. Thank you, we appreciate your assistance with this!

## 2022-10-17 NOTE — Assessment & Plan Note (Addendum)
-  stable and remains improved -PHQ-9 score of 1 with negative question 9 reviewed and discussed. -continue wellbutrin and zoloft daily -reassurance provided -follow up in 1 month for mood check, per patient preference

## 2022-10-17 NOTE — Assessment & Plan Note (Signed)
-  BP and A1c 5.3 at goal -continue current regimen, we discussed possibly titrating down on the semaglutide in the near future  -follow up in 3 months for next A1c check

## 2022-10-17 NOTE — Progress Notes (Signed)
    SUBJECTIVE:   CHIEF COMPLAINT / HPI:   Patient presents for follow up. History of hypertension and DM. Compliant on all medications. Eating healthy and staying active. 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 Reports that mood has been the same, still better since she started on the Wellbutrin. Tolerating all medications well. Her back pain has been an ongoing ordeal and following up with orthopedics for this, she is scheduled to have surgery on 5/14 and is ready to get this done with.   OBJECTIVE:   BP (!) 146/82   Pulse 81   Ht  (1.702 m)   Wt 177 lb 12.8 oz (80.6 kg)   SpO2 100%   BMI 27.85 kg/m   General: Patient well-appearing, in no acute distress. CV: RRR, no murmurs or gallops auscultated Resp: CTAB, no wheezing, rales or rhonchi noted Ext: presence of venous stasis changes, no LE edema noted bilaterally Psych: mood appropriate, very pleasant   ASSESSMENT/PLAN:   Hypertension associated with diabetes (HCC) -BP and A1c 5.3 at goal -continue current regimen, we discussed possibly titrating down on the semaglutide in the near future  -follow up in 3 months for next A1c check  Mood changes -stable and remains improved -PHQ-9 score of 1 with negative question 9 reviewed and discussed. -continue wellbutrin and zoloft daily -reassurance provided -follow up in 1 month for mood check, per patient preference   Health maintenance -Discussed importance of breast cancer screening, patient agreeable to screening. Mammogram ordered.  -Med rec reviewed and updated appropriately    Reece Leader, DO [facility]

## 2022-10-18 NOTE — Telephone Encounter (Signed)
Medication given to patient

## 2022-10-22 ENCOUNTER — Telehealth: Payer: Self-pay | Admitting: Family Medicine

## 2022-10-22 NOTE — Telephone Encounter (Signed)
Called patient to schedule Medicare Annual Wellness Visit (AWV). Left message for patient to call back and schedule Medicare Annual Wellness Visit (AWV).  Last date of AWV: 06/12/2021   Please schedule an AWVS appointment at any time with [facility] ANNUAL WELLNESS VISIT.  If any questions, please contact me at [phone].    Thank you,  Kylor Valverde  Ambulatory Clinic Support [facility] Direct dial  [phone]   

## 2022-10-23 DIAGNOSIS — E113313 Type 2 diabetes mellitus with moderate nonproliferative diabetic retinopathy with macular edema, bilateral: Secondary | ICD-10-CM | POA: Diagnosis not present

## 2022-10-23 DIAGNOSIS — Z961 Presence of intraocular lens: Secondary | ICD-10-CM | POA: Diagnosis not present

## 2022-10-23 DIAGNOSIS — H35033 Hypertensive retinopathy, bilateral: Secondary | ICD-10-CM | POA: Diagnosis not present

## 2022-10-23 DIAGNOSIS — H43823 Vitreomacular adhesion, bilateral: Secondary | ICD-10-CM | POA: Diagnosis not present

## 2022-10-29 ENCOUNTER — Telehealth: Payer: Self-pay | Admitting: *Deleted

## 2022-10-29 ENCOUNTER — Other Ambulatory Visit: Payer: Self-pay

## 2022-10-29 DIAGNOSIS — G6289 Other specified polyneuropathies: Secondary | ICD-10-CM

## 2022-10-29 MED ORDER — GABAPENTIN 100 MG PO CAPS: 200.0000 mg | ORAL_CAPSULE | Freq: Two times a day (BID) | ORAL | 4 refills | Status: DC

## 2022-10-29 NOTE — Telephone Encounter (Signed)
   Pre-operative Risk Assessment    Patient Name: [name]  DOB: [birth year] MRN: [id number]      Request for Surgical Clearance    Procedure:   L3-5 LAMINECTOMIES  Date of Surgery:  Clearance 11/05/22                                 Surgeon:   Surgeon's Group or Practice Name:  [facility] Phone number:  [phone] Fax number:  [phone]   Type of Clearance Requested:   - Medical  - Pharmacy:  Hold Apixaban (Eliquis) NOT LISTED   Type of Anesthesia:  Not Indicated   Additional requests/questions:    Wilhemina Cash   10/29/2022, 10:23 AM

## 2022-10-29 NOTE — Progress Notes (Signed)
[employer] Rep emailed and notified of surgery 10/29/2022 at 0940. Device Orders requested from MD 10/29/2022 at 0940.

## 2022-10-29 NOTE — Pre-Procedure Instructions (Signed)
Surgical Instructions    Your procedure is scheduled on Nov 05, 2022.  Report to [facility] Main Entrance "A" at 5:30 A.M., then check in with the Admitting office.  Call this number if you have problems the morning of surgery:  [phone]  If you have any questions prior to your surgery date call [phone]: Open Monday-Friday 8am-4pm If you experience any cold or flu symptoms such as cough, fever, chills, shortness of breath, etc. between now and your scheduled surgery, please notify us at the above number.     Remember:  Do not eat after midnight the night before your surgery  You may drink clear liquids until 4:30 AM the morning of your surgery.   Clear liquids allowed are: Water, Non-Citrus Juices (without pulp), Carbonated Beverages, Clear Tea, Black Coffee Only (NO MILK, CREAM OR POWDERED CREAMER of any kind), and Gatorade.  Patient Instructions  The night before surgery:  No food after midnight. ONLY clear liquids after midnight  The day of surgery (if you have diabetes): Drink ONE (1) 12 oz G2 given to you in your pre admission testing appointment by 4:30 AM the morning of surgery. Drink in one sitting. Do not sip.  This drink was given to you during your hospital  pre-op appointment visit.  Nothing else to drink after completing the  12 oz bottle of G2.         If you have questions, please contact your surgeon's office.     Take these medicines the morning of surgery with A SIP OF WATER:  amLODipine (NORVASC)   atorvastatin (LIPITOR)   buPROPion (WELLBUTRIN XL)   gabapentin (NEURONTIN)   sertraline (ZOLOFT)   tiZANidine (ZANAFLEX) - may take if needed    Follow your surgeon's instructions on when to stop apixaban (ELIQUIS).  If no instructions were given by your surgeon then you will need to call the office to get those instructions.    As of today, STOP taking any Aspirin (unless otherwise instructed by your surgeon) Aleve, Naproxen, Ibuprofen, Motrin,  Advil, Goody's, BC's, all herbal medications, fish oil, and all vitamins. This includes your medication: diclofenac Sodium (VOLTAREN) GEL    WHAT DO I DO ABOUT MY DIABETES MEDICATION?     STOP taking your Semaglutide one week prior to surgery.   HOW TO MANAGE YOUR DIABETES BEFORE AND AFTER SURGERY  Why is it important to control my blood sugar before and after surgery? Improving blood sugar levels before and after surgery helps healing and can limit problems. A way of improving blood sugar control is eating a healthy diet by:  Eating less sugar and carbohydrates  Increasing activity/exercise  Talking with your doctor about reaching your blood sugar goals High blood sugars (greater than 180 mg/dL) can raise your risk of infections and slow your recovery, so you will need to focus on controlling your diabetes during the weeks before surgery. Make sure that the doctor who takes care of your diabetes knows about your planned surgery including the date and location.  How do I manage my blood sugar before surgery? Check your blood sugar at least 4 times a day, starting 2 days before surgery, to make sure that the level is not too high or low.  Check your blood sugar the morning of your surgery when you wake up and every 2 hours until you get to the Short Stay unit.  If your blood sugar is less than 70 mg/dL, you will need to treat for low blood  sugar: Do not take insulin. Treat a low blood sugar (less than 70 mg/dL) with  cup of clear juice (cranberry or apple), 4 glucose tablets, OR glucose gel. Recheck blood sugar in 15 minutes after treatment (to make sure it is greater than 70 mg/dL). If your blood sugar is not greater than 70 mg/dL on recheck, call [phone] for further instructions. Report your blood sugar to the short stay nurse when you get to Short Stay.  If you are admitted to the hospital after surgery: Your blood sugar will be checked by the staff and you will probably be  given insulin after surgery (instead of oral diabetes medicines) to make sure you have good blood sugar levels. The goal for blood sugar control after surgery is 80-180 mg/dL.                      Do NOT Smoke (Tobacco/Vaping) for 24 hours prior to your procedure.  If you use a CPAP at night, you may bring your mask/headgear for your overnight stay.   Contacts, glasses, piercing's, hearing aid's, dentures or partials may not be worn into surgery, please bring cases for these belongings.    For patients admitted to the hospital, discharge time will be determined by your treatment team.   Patients discharged the day of surgery will not be allowed to drive home, and someone needs to stay with them for 24 hours.  SURGICAL WAITING ROOM VISITATION Patients having surgery or a procedure may have no more than 2 support people in the waiting area - these visitors may rotate.   Children under the age of 36 must have an adult with them who is not the patient. If the patient needs to stay at the hospital during part of their recovery, the visitor guidelines for inpatient rooms apply. Pre-op nurse will coordinate an appropriate time for 1 support person to accompany patient in pre-op.  This support person may not rotate.   Please refer to the [facility] website for the visitor guidelines for Inpatients (after your surgery is over and you are in a regular room).    Special instructions:   [facility]- Preparing For Surgery  Before surgery, you can play an important role. Because skin is not sterile, your skin needs to be as free of germs as possible. You can reduce the number of germs on your skin by washing with CHG (chlorahexidine gluconate) Soap before surgery.  CHG is an antiseptic cleaner which kills germs and bonds with the skin to continue killing germs even after washing.    Oral Hygiene is also important to reduce your risk of infection.  Remember - BRUSH YOUR TEETH THE MORNING OF SURGERY  WITH YOUR REGULAR TOOTHPASTE  Please follow the instructions on the handout you received at your pre-admission appointment about preparing for your upcoming surgery using the CHG surgical soap.   Day of Surgery: Take a shower with CHG soap. Do not wear jewelry or makeup Do not wear lotions, powders, perfumes/colognes, or deodorant. Do not shave 48 hours prior to surgery.  Men may shave face and neck. Do not bring valuables to the hospital.  [facility] is not responsible for any belongings or valuables. Do not wear nail polish, gel polish, artificial nails, or any other type of covering on natural nails (fingers and toes) If you have artificial nails or gel coating that need to be removed by a nail salon, please have this removed prior to surgery. Artificial nails or  gel coating may interfere with anesthesia's ability to adequately monitor your vital signs. Wear Clean/Comfortable clothing the morning of surgery Remember to brush your teeth WITH YOUR REGULAR TOOTHPASTE.   Please read over the following fact sheets that you were given.    If you received a COVID test during your pre-op visit  it is requested that you wear a mask when out in public, stay away from anyone that may not be feeling well and notify your surgeon if you develop symptoms. If you have been in contact with anyone that has tested positive in the last 10 days please notify you surgeon.

## 2022-10-29 NOTE — Telephone Encounter (Signed)
Patient with diagnosis of afib on Eliquis for anticoagulation.    Procedure: L3-5 laminectomies Date of procedure: 11/05/22  CHA2DS2-VASc Score = 5  This indicates a 7.2% annual risk of stroke. The patient's score is based upon: CHF History: 0 HTN History: 1 Diabetes History: 1 Stroke History: 0 Vascular Disease History: 1 Age Score: 1 Gender Score: 1   CrCl 3mL/min Platelet count 238K  Pt has not been seen by cardiology since 2022. If note new clinical risk factors are noted at upcoming office visit, ok for pt to hold Eliquis for 3 days prior to procedure.  **This guidance is not considered finalized until pre-operative APP has relayed final recommendations.**

## 2022-10-29 NOTE — Telephone Encounter (Signed)
Pt needs an appt with EP for pre op clearance. I will send message to EP scheduler. Pt's procedure is set for 11/05/22.

## 2022-10-29 NOTE — Telephone Encounter (Signed)
   Name: [name]  DOB: 1956-04-29  MRN: [id number]  Primary Cardiologist: None  Chart reviewed as part of pre-operative protocol coverage. Because of DEMARIAH PADRO past medical history and time since last visit, she will require a follow-up in-office visit in order to better assess preoperative cardiovascular risk.  Pre-op covering staff: - Please schedule appointment and call patient to inform them. If patient already had an upcoming appointment within acceptable timeframe, please add "pre-op clearance" to the appointment notes so provider is aware. - Please contact requesting surgeon's office via preferred method (i.e, phone, fax) to inform them of need for appointment prior to surgery.  This message will also be routed to pharmacy pool or input on holding Eliquis as requested below so that this information is available to the clearing provider at time of patient's appointment.   Carlos Levering, NP  10/29/2022, 11:56 AM

## 2022-10-30 ENCOUNTER — Encounter (HOSPITAL_COMMUNITY)
Admission: RE | Admit: 2022-10-30 | Discharge: 2022-10-30 | Disposition: A | Discharge status: Home / Self Care | Payer: Medicare Other | Source: Ambulatory Visit | Attending: Orthopedic Surgery | Admitting: Orthopedic Surgery

## 2022-10-30 ENCOUNTER — Other Ambulatory Visit: Payer: Self-pay

## 2022-10-30 ENCOUNTER — Encounter (HOSPITAL_COMMUNITY): Payer: Self-pay

## 2022-10-30 VITALS — BP 119/47 | HR 68 | Temp 98.2°F | Resp 18 | Ht 67.0 in | Wt 178.6 lb

## 2022-10-30 DIAGNOSIS — Z7901 Long term (current) use of anticoagulants: Secondary | ICD-10-CM | POA: Diagnosis not present

## 2022-10-30 DIAGNOSIS — M48062 Spinal stenosis, lumbar region with neurogenic claudication: Secondary | ICD-10-CM | POA: Diagnosis not present

## 2022-10-30 DIAGNOSIS — I4819 Other persistent atrial fibrillation: Secondary | ICD-10-CM | POA: Insufficient documentation

## 2022-10-30 DIAGNOSIS — R001 Bradycardia, unspecified: Secondary | ICD-10-CM | POA: Diagnosis not present

## 2022-10-30 DIAGNOSIS — I2510 Atherosclerotic heart disease of native coronary artery without angina pectoris: Secondary | ICD-10-CM

## 2022-10-30 DIAGNOSIS — G4733 Obstructive sleep apnea (adult) (pediatric): Secondary | ICD-10-CM | POA: Insufficient documentation

## 2022-10-30 DIAGNOSIS — E119 Type 2 diabetes mellitus without complications: Secondary | ICD-10-CM | POA: Insufficient documentation

## 2022-10-30 DIAGNOSIS — Z01818 Encounter for other preprocedural examination: Secondary | ICD-10-CM | POA: Insufficient documentation

## 2022-10-30 DIAGNOSIS — I10 Essential (primary) hypertension: Secondary | ICD-10-CM | POA: Diagnosis not present

## 2022-10-30 LAB — CBC
HCT: 36.3 % (ref 36.0–46.0)
Hemoglobin: 10.7 g/dL — ABNORMAL LOW (ref 12.0–15.0)
MCH: 27 pg (ref 26.0–34.0)
MCHC: 29.5 g/dL — ABNORMAL LOW (ref 30.0–36.0)
MCV: 91.7 fL (ref 80.0–100.0)
Platelets: 236 10*3/uL (ref 150–400)
RBC: 3.96 MIL/uL (ref 3.87–5.11)
RDW: 13.7 % (ref 11.5–15.5)
WBC: 4.8 10*3/uL (ref 4.0–10.5)
nRBC: 0 % (ref 0.0–0.2)

## 2022-10-30 LAB — COMPREHENSIVE METABOLIC PANEL
ALT: 9 U/L (ref 0–44)
AST: 16 U/L (ref 15–41)
Albumin: 3.9 g/dL (ref 3.5–5.0)
Alkaline Phosphatase: 82 U/L (ref 38–126)
Anion gap: 9 (ref 5–15)
BUN: 31 mg/dL — ABNORMAL HIGH (ref 8–23)
CO2: 21 mmol/L — ABNORMAL LOW (ref 22–32)
Calcium: 8.8 mg/dL — ABNORMAL LOW (ref 8.9–10.3)
Chloride: 109 mmol/L (ref 98–111)
Creatinine, Ser: 1.75 mg/dL — ABNORMAL HIGH (ref 0.44–1.00)
GFR, Estimated: 31 mL/min — ABNORMAL LOW (ref >60)
Glucose, Bld: 94 mg/dL (ref 70–99)
Potassium: 4.4 mmol/L (ref 3.5–5.1)
Sodium: 139 mmol/L (ref 135–145)
Total Bilirubin: 0.5 mg/dL (ref 0.3–1.2)
Total Protein: 6.7 g/dL (ref 6.5–8.1)

## 2022-10-30 LAB — HEMOGLOBIN A1C
Hgb A1c MFr Bld: 5.6 % (ref 4.8–5.6)
Mean Plasma Glucose: 114.02 mg/dL

## 2022-10-30 LAB — SURGICAL PCR SCREEN
MRSA, PCR: NEGATIVE
Staphylococcus aureus: NEGATIVE

## 2022-10-30 LAB — GLUCOSE, CAPILLARY: Glucose-Capillary: 89 mg/dL (ref 70–99)

## 2022-10-30 NOTE — Progress Notes (Addendum)
PCP - Reece Leader, DO Cardiologist - Dr. Hillis Range  PPM/ICD - PPM Device Orders - Device orders requested - Pt must be seen in office before device orders can be given Rep Notified - [employer] Rep Email 10/29/2022  Chest x-ray - n/a EKG - 10/30/2022 Stress Test - 2019 - CE ECHO - 11/18/2019 Cardiac Cath - 2006 -Epic  Sleep Study - +[facility] in 2008 for which pt wore a CPAP. Pt had Gastric Bypass surgery 2015 and has not required CPAP since she lost weight  Pt is DM2. She checks her blood sugar 1-2x/month. Normal fasting range is 80-100. CBG at pre-op appointment 89. So far today she has had 6-8 Ritz crackers and a few sips of diet soda. A1c result pending.  Last dose of GLP1 agonist- Pts last dose of Ozempic May 5th GLP1 instructions: Pt instructed to NOT take her next dose of Ozempic May 12th. Pt understood instructions.  Blood Thinner Instructions: Pt instructed to receive Eliquis instructions from surgeon's office today. Aspirin Instructions: n/a  ERAS Protcol - Clear liquids until 0430 morning of surgery PRE-SURGERY Ensure or G2- G2 given to pt with instructions  COVID TEST- n/a   Anesthesia review: Yes. Pt has not seen her cardiologist in over a year. Pt will need to have a cardiology clearance appointment prior to surgery. Appointment made with pt during PAT visit. She will be seen tomorrow at their [place] Location at 9:30am. Discussed with Antionette Poles, PA-C. Abnormal Creatinine result.  Patient denies shortness of breath, fever, cough and chest pain at PAT appointment. Pt denies any respiratory illness/infection in the last two months.   All instructions explained to the patient, with a verbal understanding of the material. Patient agrees to go over the instructions while at home for a better understanding. Patient also instructed to self quarantine after being tested for COVID-19. The opportunity to ask questions was provided.

## 2022-10-30 NOTE — Progress Notes (Unsigned)
Cardiology Office Note Date:  10/31/2022  Patient ID:  [name], [name] [birth year], MRN [id number] PCP:  Reece Leader, DO  Cardiologist:  None Electrophysiologist: Dr. Johney Frame > Lanier Prude, MD   Chief Complaint: PPM follow-up, past due; preop clearance  History of Present Illness: [name] is a 67 y.o. female with PMH notable for T2DM, HTN, OSA, NASH, persis A-fib, symptomatic bradycardia status post PPM; seen today for Lanier Prude, MD for cardiac clearance for spine surgery scheduled next week.  Last saw PA Keitha Butte 04/2021, after only seeing Dr. Johney Frame at time of pacemaker implant on 01/2020.  During her appointment with Renee she described intermittent feelings of being lightheaded (like being a little drunk), much improved since PPM placement.   Today, she tells me that she feels very well, no acute cardiac complaints.  Has some intermittent dizziness about 1/week, usually during the afternoons when assisting husband to bed. 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  Checks BP at home, but unsure of readings. PCP has been managing BP meds.   Has intermittent palpitations, but not overly bothersome. Do not limit activities.  No bleeding concerns on eliquis. Does have small cuts on R leg and arms.    Device Information: MDT Micra leadless PPM, imp 02/08/20; dx bradycardia, afib  Past Medical History:  Diagnosis Date   Advanced directives, counseling/discussion 04/12/2020   Allergy    Anemia    ANEMIA, PERNICIOUS, HX OF 05/13/2007   Anxiety    Arthritis    ASYMPTOMATIC POSTMENOPAUSAL STATUS 02/18/2008   B12 deficiency 12/13/2016   BACK PAIN, LUMBAR 11/19/2007   Blood in urine 07/19/2019   Bowel incontinence    Cataract    Cellulitis of left lower leg 10/28/2013   Cellulitis of leg, right 10/11/2010   Chronic depression 10/06/2007   Chronic dermatitis of hands 05/02/2009   Coronary artery disease involving native coronary artery of native heart without angina pectoris  07/07/2018   Last Assessment & Plan:  Formatting of this note might be different from the original. The patient reports remote cardiac catheterization with up to 40% plaque.  Subsequent Cardiolite stress test in October 2019 did not show any evidence of active ischemia or prior infarction. Formatting of this note might be different from the original. medical  Last Assessment & Plan:  Formatting of this note mi   DEPRESSION, CHRONIC 10/06/2007   Diabetes mellitus without complication (HCC)    DIABETES MELLITUS, WITH NEUROLOGICAL COMPLICATIONS 02/18/2008   Diabetic neuropathy (HCC)    DYSLIPIDEMIA 05/13/2007   Dyslipidemia 04/15/2017   Last Assessment & Plan:  The patient will continue atorvastatin for her dyslipidemia.   Dyslipidemia associated with type 2 diabetes mellitus (HCC) 07/11/2018   Essential hypertension 03/20/2010   Family history of colon cancer    Fecal incontinence 08/02/2015   Frozen shoulder    Lt   Full dentures    Gastroesophageal reflux disease without esophagitis 04/15/2017   Generalized abdominal pain 02/05/2018   Genetic testing 11/24/2017   Negative genetic testing on the common hereditary cancer panel.  The Hereditary Gene Panel offered by [facility] includes sequencing and/or deletion duplication testing of the following 47 genes: APC, ATM, AXIN2, BARD1, BMPR1A, BRCA1, BRCA2, BRIP1, CDH1, CDK4, CDKN2A (p14ARF), CDKN2A (p16INK4a), CHEK2, CTNNA1, DICER1, EPCAM (Deletion/duplication testing only), GREM1 (promoter region deletion/duplicat   GERD (gastroesophageal reflux disease)    GI bleed 03/15/2016   GOITER, MULTINODULAR 05/13/2007   GOITER, MULTINODULAR 05/13/2007    (last TSH 3.26;  US soft tissue neck in 09/2004 showed multinodular goiter with specific small nodule for which was recommended to be followed by repeat US in 3-6 months 12/2016: Thyroid US: recommend repeat in 1 year (one nodule 1.7cm on left thyroid)    Headache    History of blood transfusion    Hx of  colonic polyps 12/04/2010   HYPERCHOLESTEROLEMIA 03/20/2010   Hyperparathyroidism (HCC) 04/11/2020   HYPERTENSION 03/20/2010   Hypertension associated with diabetes (HCC) 07/11/2018   Iron deficiency anemia 06/19/2012   Low back pain 03/05/2012   Completed PT-notes say very motivated and had good progress.   CT (03/2020: Mild curvature convex to the right in the upper lumbar region into left in lower lumbar region. No antero or retrolisthesis in the supine position. retrolisthesis at L3-4 of 3 mm with flexion which increases to 6 mm with neutral and extension. Multifactorial spinal stenosis at L3-4 due to circumferential protrusion of the    Lower back pain    Major depression in remission (HCC) 07/07/2018   Last Assessment & Plan:  Mood has seemed good on sertraline.   Malabsorption 03/12/2016   NASH (nonalcoholic steatohepatitis)    Nausea 07/19/2019   Need for immunization against influenza 04/12/2020   No-show for appointment 03/08/2020   Nonobstructive CAD  08/26/2013   The patient has prior cardiac workup in 2006 when she underwent a stress test for abnormal EKG. The stress test was nonconclusive and she underwent cardiac catheterization that showed 30% stenosis in the LAD in 25% stenosis in RCA she was treated medically.     OA (osteoarthritis)    OBSTRUCTIVE SLEEP APNEA 06/12/2007   uses CPAP.   Pacemaker    Paroxysmal atrial fibrillation (HCC) 05/11/2018   Last Assessment & Plan:  Patient was noted to have episodes of paroxysmal atrial fibrillation that was predominantly rate controlled and at a relatively low burden.  She is chronically on anticoagulation now with Eliquis.  She does not take any negative chronotropic agents with her sinus bradycardia.  If she has any breakthrough more prolonged tachycardic episodes and requires negative chronotropi   Peripheral autonomic neuropathy due to diabetes mellitus (HCC) 07/11/2018   PERIPHERAL NEUROPATHY 01/12/2009   Peripheral neuropathy  01/12/2009   Peripheral vascular disease (HCC)    Presbycusis of both ears 04/11/2020   Primary osteoarthritis involving multiple joints 07/07/2018   Last Assessment & Plan:  Post op left tka and here for rehab as her husband is handicapped so she has limited help at home. Consitpation noted, some pain overnight, better with a pillow under her knee.   Primary osteoarthritis of left knee 04/23/2011   Previously seen by Dr. Dorene Grebe. Will plan on repeat referral after trial injection.      PSORIASIS 05/28/2010   Rectal prolapse 05/24/2015   Rectovaginal fistula 05/25/2014   Renal insufficiency    stage 1 kd   S/P bariatric surgery-duodenal switch with sleeve gastrectomy 12/04/2013   S/P total knee arthroplasty, right 11/16/2019   Screening mammogram, encounter for 07/12/2019   Second degree AV block 02/08/2020   Serum calcium elevated 04/12/2020   Short bowel syndrome 07/11/2018   Spigelian hernia 02/05/2018   Status post left knee replacement 07/11/2018   Stress 05/24/2015   Surgical counseling visit 10/02/2019   Tubular adenoma of colon 03/09/2016   Colonoscopy in 10/2015: 3  tubular adenomas; GI recommends repeat colonoscopy in 3 years Colonoscopy August 2012: 3 tubular adenomas.    Type 2 diabetes, controlled, with neuropathy (HCC)  02/18/2008   Umbilical hernia without obstruction and without gangrene 04/15/2017   Unilateral primary osteoarthritis, left knee    UNSPECIFIED VENOUS INSUFFICIENCY 05/28/2010   Patient with frequent ulceration related to venous stasis-seen at wound care. Edema and Venous stasis changes due to this . Echo 04/16/11 with EF 60%. No signs diastolic dysfunction.        Vaccination against Streptococcus pneumoniae within past 5 years 04/12/2020   Vaginal irritation 07/19/2019   Venous stasis dermatitis of right lower extremity 01/02/2018   Vitamin D deficiency 03/09/2016   Wears glasses     Past Surgical History:  Procedure Laterality Date   CARDIAC  CATHETERIZATION  2006   CATARACT EXTRACTION W/ INTRAOCULAR LENS  IMPLANT, BILATERAL     COLONOSCOPY     ELECTROCARDIOGRAM  04/16/2006   EXAMINATION UNDER ANESTHESIA N/A 06/29/2014   Procedure: EXAM UNDER ANESTHESIA;  Surgeon: Sherian Rein, MD;  Location: [facility];  Service: Gynecology;  Laterality: N/A;   Exercise myoview  01/24/2005   FLEXIBLE SIGMOIDOSCOPY     GASTRIC BYPASS  11/09/2013   JOINT REPLACEMENT     KNEE ARTHROSCOPY  2003   right   LAPAROSCOPY N/A 03/12/2016   Procedure: LAPAROSCOPIC ANASTOMOSIS OF INTESTINE (ENTEROENTEROSTOMY);  Surgeon: Everette Rank, MD;  Location: [facility] ORS;  Service: General;  Laterality: N/A;   LEG SURGERY Left    metal and pins in lower left leg   mrsa Right    arm   MULTIPLE TOOTH EXTRACTIONS     PACEMAKER LEADLESS INSERTION N/A 02/08/2020   Procedure: PACEMAKER LEADLESS INSERTION;  Surgeon: Hillis Range, MD;  Location: [facility];  Service: Cardiovascular;  Laterality: N/A;   PARATHYROIDECTOMY Left 02/19/2021   Procedure: LEFT PARATHYROIDECTOMY WITH FROZEN SECTION;  Surgeon: Serena Colonel, MD;  Location: [facility] OR;  Service: ENT;  Laterality: Left;   SHOULDER ARTHROSCOPY W/ ROTATOR CUFF REPAIR Right    stab phlebectomy  Right 02/10/2019   stab phlebectomy > 20 incisions right leg by Fabienne Bruns MD    TONSILLECTOMY     TOTAL KNEE ARTHROPLASTY Left 06/30/2018   Procedure: LEFT TOTAL KNEE ARTHROPLASTY;  Surgeon: Cammy Copa, MD;  Location: [facility] OR;  Service: Orthopedics;  Laterality: Left;   TOTAL KNEE ARTHROPLASTY Right 11/16/2019   Procedure: RIGHT TOTAL KNEE ARTHROPLASTY-CEMENTED;  Surgeon: Cammy Copa, MD;  Location[facility];  Service: Orthopedics;  Laterality: Right;   VARICOSE VEIN SURGERY     Remotef    Current Outpatient Medications  Medication Instructions   Accu-Chek Softclix Lancets lancets Use as instructed once daily   amLODipine (NORVASC) 10 mg, Oral, Daily   apixaban (ELIQUIS) 5 MG TABS tablet Take 1  tablet by mouth two times daily.  Must schedule OV for further refills   atorvastatin (LIPITOR) 40 mg, Oral, Daily   blood glucose meter kit and supplies KIT Dispense based on patient and insurance preference. Use up to four times daily as directed.   buPROPion (WELLBUTRIN XL) 150 mg, Oral, Daily   gabapentin (NEURONTIN) 300 mg, Oral, Daily at bedtime   gabapentin (NEURONTIN) 200 mg, Oral, 2 times daily, In the morning and at lunchtime   glucose blood (ONETOUCH VERIO) test strip 1 each, Other, As needed, Use as instructed   lisinopril-hydrochlorothiazide (ZESTORETIC) 20-25 MG tablet 1 tablet, Oral, Daily   Multiple Vitamins-Minerals (MULTIVITAMIN ADULT) CHEW Once daily   Semaglutide (2 MG/DOSE) 2 mg, Subcutaneous, Weekly, Once weekly   sertraline (ZOLOFT) 100 mg, Oral, Daily   spironolactone (ALDACTONE) 25  MG tablet TAKE 1 TABLET BY MOUTH DAILY.   Teduglutide (rDNA) 5 mg, Subcutaneous, Daily, Gattex   tiZANidine (ZANAFLEX) 4 mg, Oral, 3 times daily PRN   tobramycin (TOBREX) 0.3 % ophthalmic solution See admin instructions, Every eight weeks    Social History:  The patient  reports that she quit smoking about 28 years ago. Her smoking use included cigarettes. She started smoking about 52 years ago. She has a 48.00 pack-year smoking history. She has never used smokeless tobacco. She reports that she does not drink alcohol and does not use drugs.   Family History:  The patient's family history includes Aneurysm in her mother; Bipolar disorder in her sister and sister; Cancer in her paternal aunt; Cerebral aneurysm in her mother; Cirrhosis in her father; Colon cancer (age of onset: 15) in her sister; Colon cancer (age of onset: 58) in her father; Colon cancer (age of onset: 38) in her sister; Congestive Heart Failure in her maternal grandmother; Hyperlipidemia in her father; Hypertension in her father.  ROS:  Please see the history of present illness. All other systems are reviewed and otherwise  negative.   PHYSICAL EXAM:  VS:  BP 120/70 (BP Location: Left Arm, Patient Position: Sitting, Cuff Size: Normal)   Pulse 96   Ht 5\' 8"  (1.727 m)   Wt 176 lb (79.8 kg)   SpO2 96%   BMI 26.76 kg/m  BMI: Body mass index is 26.76 kg/m.  GEN- The patient is well appearing, alert and oriented x 3 today.   Lungs- Clear to ausculation bilaterally, normal work of breathing.  Heart- Regular rate and rhythm, no murmurs, rubs or gallops Extremities- No peripheral edema, warm, dry Skin-   device pocket well-healed, no tethering   Device interrogation done today and reviewed by myself:  Battery good Lead threshold, impedence, sensing stable  Histograms appropriate No changes made today  EKG is not ordered. Personal review of EKG from  10/30/22  shows:  AFib, rate 68bpm  Recent Labs: 10/30/2022: ALT 9; BUN 31; Creatinine, Ser 1.75; Hemoglobin 10.7; Platelets 236; Potassium 4.4; Sodium 139  No results found for requested labs within last 365 days.   Estimated Creatinine Clearance: 34.1 mL/min (A) (by C-G formula based on SCr of 1.75 mg/dL (H)).   Wt Readings from Last 3 Encounters:  10/31/22 176 lb (79.8 kg)  10/30/22 178 lb 9.6 oz (81 kg)  10/17/22 177 lb 12.8 oz (80.6 kg)     Additional studies reviewed include: Previous EP, cardiology notes.   TTE, 11/18/2019  1. Left ventricular ejection fraction, by estimation, is 55 to 60%. The left ventricle has normal function. The left ventricle has no regional wall motion abnormalities. Left ventricular diastolic function could not be evaluated.   2. Right ventricular systolic function is normal. The right ventricular size is normal. There is normal pulmonary artery systolic pressure.   3. The mitral valve is abnormal. Trivial mitral valve regurgitation.   4. The aortic valve is tricuspid. Aortic valve regurgitation is not visualized.   ASSESSMENT AND PLAN:  #) Persis AFib, ?perm AFib Ventricular rates well-controlled Minimal symptomatic  burden CHA2DS2-VASc Score = 5 (HTN, T2DM, vasc dz, age, gender)  NOAC - eliquis 5mg  BID, appropriately dosed No bleeding concerns    #) PPM in situ Device functioning well, see paceart for details  #) HTN At goal today, though question whether symptoms of dizziness are orthostatic hypotension Recommend checking blood pressures 3-4 times per week at home and recording the values.  Recommend bringing these recordings to the primary care physician.   #) Pre-operative clearance [name]'s perioperative risk of a major cardiac event is 0.9% according to the Revised Cardiac Risk Index (RCRI).  Therefore, she is at low risk for perioperative complications.    Recommendations: According to ACC/AHA guidelines, no further cardiovascular testing needed.  The patient may proceed to surgery at acceptable risk.   Antiplatelet and/or Anticoagulation Recommendations: Refer to prior pharmacy note regarding anticoagulation recommendations   Current medicines are reviewed at length with the patient today.   The patient does not have concerns regarding her medicines.  The following changes were made today:  none  Labs/ tests ordered today include:  No orders of the defined types were placed in this encounter.    Disposition: Follow up with Dr. Lalla Brothers in in 12 months   Signed, Sherie Don, NP  10/31/22  10:19 AM  Electrophysiology [facility]

## 2022-10-31 ENCOUNTER — Encounter: Payer: Self-pay | Admitting: Cardiology

## 2022-10-31 ENCOUNTER — Ambulatory Visit: Payer: Medicare Other | Attending: Cardiology | Admitting: Cardiology

## 2022-10-31 VITALS — BP 120/70 | HR 96 | Ht 68.0 in | Wt 176.0 lb

## 2022-10-31 DIAGNOSIS — E1159 Type 2 diabetes mellitus with other circulatory complications: Secondary | ICD-10-CM | POA: Diagnosis not present

## 2022-10-31 DIAGNOSIS — I152 Hypertension secondary to endocrine disorders: Secondary | ICD-10-CM | POA: Insufficient documentation

## 2022-10-31 DIAGNOSIS — I480 Paroxysmal atrial fibrillation: Secondary | ICD-10-CM | POA: Diagnosis not present

## 2022-10-31 DIAGNOSIS — Z7985 Long-term (current) use of injectable non-insulin antidiabetic drugs: Secondary | ICD-10-CM

## 2022-10-31 DIAGNOSIS — Z950 Presence of cardiac pacemaker: Secondary | ICD-10-CM | POA: Diagnosis not present

## 2022-10-31 LAB — CUP PACEART INCLINIC DEVICE CHECK
Date Time Interrogation Session: 20240509103451
Implantable Pulse Generator Implant Date: 20210817

## 2022-10-31 NOTE — Progress Notes (Signed)
PERIOPERATIVE PRESCRIPTION FOR IMPLANTED CARDIAC DEVICE PROGRAMMING  Patient Information: Name:  [name]  DOB:  03-Jan-1956  MRN:  [id number]    Planned Procedure:  Lumber 3-5 Laminectomies and Foraminotomies  Surgeon:  Dr. Willia Craze  Date of Procedure:  Nov 05, 2022  Cautery will be used.  Position during surgery:  Prone   Please send documentation back to:  [facility] (Fax # [phone])   Device Information:  Clinic EP Physician:  Dr. Steffanie Dunn   Device Type:  Pacemaker Manufacturer and Phone #:  Medtronic: [phone] Pacemaker Dependent?:  No. Date of Last Device Check:  10/31/2022 Normal Device Function?:  Yes.    Electrophysiologist's Recommendations:  Have magnet available. Provide continuous ECG monitoring when magnet is used or reprogramming is to be performed.  Procedure will likely interfere with device function.  Device should be programmed:  Asynchronous pacing during procedure and returned to normal programming after procedure  Per Device Clinic Standing Orders, Lenor Coffin, RN  10:43 AM 10/31/2022

## 2022-10-31 NOTE — Progress Notes (Signed)
Anesthesia Chart Review:  Follows with cardiology for history of HTN, OSA, persistent A-fib on Eliquis, symptomatic bradycardia s/p Medtronic Micra leadless PPM.  Last seen by Sherie Don, NP 10/31/2022 for preop evaluation.  Noted to be doing well at that time, no acute cardiac complaints.  Per note, "Ms. Domke's perioperative risk of a major cardiac event is 0.9% according to the Revised Cardiac Risk Index (RCRI).  Therefore, she is at low risk for perioperative complications.    Recommendations: According to ACC/AHA guidelines, no further cardiovascular testing needed.  The patient may proceed to surgery at acceptable risk.  Antiplatelet and/or Anticoagulation Recommendations: Refer to prior pharmacy note regarding anticoagulation recommendations."  Pharmacy previously advised okay to hold Eliquis 3 days prior to procedure.  Patient on once weekly GLP-1 agonist Ozempic.  Reports last dose 09/27/2022.  Prior history of OSA, pt reports since having gastric bypass surgery in 2015 she no longer requires CPAP.   NIDDM2, A1c 5.6 on preop labs.  Proep labs reviewed, creatinine elevated at 1.75, mild anemia with Hgb 10.7. Review of prior labs shows baseline mild renal insufficiency. Last labs 11/05/21 with creatinine 1.33. Will repeat Istat DOS to ensure it is not continuing to trend up.  EKG 10/30/2022: Atrial fibrillation. Rate 68. Low voltage QRS. Cannot rule out Anteroseptal infarct , age undetermined  Perioperative prescription for implanted cardiac device programming per progress note 10/31/2022: Device Information:   Clinic EP Physician:  Dr. Steffanie Dunn    Device Type:  Pacemaker Manufacturer and Phone #:  Medtronic: [id number] Pacemaker Dependent?:  No. Date of Last Device Check:  10/31/2022         Normal Device Function?:  Yes.     Electrophysiologist's Recommendations:   Have magnet available. Provide continuous ECG monitoring when magnet is used or reprogramming is to be performed.   Procedure will likely interfere with device function.  Device should be programmed:  Asynchronous pacing during procedure and returned to normal programming after procedure  Nuclear stress 04/08/2018: Summary:  1. LV EF was 52%  2. Global left ventricular systolic function was , with an EF of  52%.Negative  study for ischemia or infarct.   TTE 11/18/19:  1. Left ventricular ejection fraction, by estimation, is 55 to 60%. The  left ventricle has normal function. The left ventricle has no regional  wall motion abnormalities. Left ventricular diastolic function could not  be evaluated.   2. Right ventricular systolic function is normal. The right ventricular  size is normal. There is normal pulmonary artery systolic pressure.   3. The mitral valve is abnormal. Trivial mitral valve regurgitation.   4. The aortic valve is tricuspid. Aortic valve regurgitation is not  visualized.      [facility] Stay [facility]/Anesthesiology Phone [phone] 10/31/2022 12:10 PM

## 2022-10-31 NOTE — Anesthesia Preprocedure Evaluation (Addendum)
Anesthesia Evaluation  Patient identified by MRN, date of birth, ID band Patient awake    Reviewed: Allergy & Precautions, NPO status , Patient's Chart, lab work & pertinent test results  History of Anesthesia Complications Negative for: history of anesthetic complications  Airway Mallampati: II  TM Distance: >3 FB Neck ROM: Full    Dental no notable dental hx. (+) Edentulous Upper, Edentulous Lower, Upper Dentures, Lower Dentures   Pulmonary sleep apnea and Continuous Positive Airway Pressure Ventilation , former smoker 02/15/2021 SARS coronavirus NEG   Pulmonary exam normal breath sounds clear to auscultation       Cardiovascular hypertension, Pt. on medications (-) angina + CAD and + Peripheral Vascular Disease  Normal cardiovascular exam+ dysrhythmias Atrial Fibrillation + pacemaker  Rhythm:Regular Rate:Normal  '21 ECHO: EF 55-60%, normal LVF, normal RVF, mitral annular calcification, no significant valvular abnormalities  '19 Lexicon: EF 52%, no ischemia    Neuro/Psych  Headaches  Anxiety Depression       GI/Hepatic ,GERD  ,,(+) Hepatitis - (NASH)S/p gastric sleeve   Endo/Other  diabetes, Type 2, Oral Hypoglycemic AgentsHypothyroidism    Renal/GU Renal InsufficiencyRenal diseaseLab Results      Component                Value               Date                      CREATININE               1.75 (H)            10/30/2022                    K                        4.4                 10/30/2022                     Musculoskeletal  (+) Arthritis ,    Abdominal   Peds  Hematology  (+) Blood dyscrasia, anemia Lab Results      Component                Value               Date                      WBC                      4.8                 10/30/2022                HGB                      10.7 (L)            10/30/2022                HCT                      36.3                10/30/2022  MCV                       91.7                10/30/2022                PLT                      236                 10/30/2022              Anesthesia Other Findings All Latex, Cephalexin, polymixin B  Reproductive/Obstetrics                             Anesthesia Physical Anesthesia Plan  ASA: 3  Anesthesia Plan: General   Post-op Pain Management:    Induction: Intravenous  PONV Risk Score and Plan: 3 and Ondansetron, Dexamethasone and Treatment may vary due to age or medical condition  Airway Management Planned: Oral ETT  Additional Equipment: Arterial line  Intra-op Plan:   Post-operative Plan: Extubation in OR  Informed Consent: I have reviewed the patients History and Physical, chart, labs and discussed the procedure including the risks, benefits and alternatives for the proposed anesthesia with the patient or authorized representative who has indicated his/her understanding and acceptance.     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  Last seen by Sherie Don, NP 10/31/2022 for preop evaluation.  Noted to be doing well at that time, no acute cardiac complaints.  Per note, "Ms. Sapia's perioperative risk of a major cardiac event is 0.9% according to the Revised Cardiac Risk Index (RCRI).  Therefore, she is at low risk for perioperative complications.    Recommendations: According to ACC/AHA guidelines, no further cardiovascular testing needed.  The patient may proceed to surgery at acceptable risk.  Antiplatelet and/or Anticoagulation Recommendations: Refer to prior pharmacy note regarding anticoagulation recommendations."  Pharmacy previously advised okay to hold Eliquis 3 days prior to procedure.  Patient on once weekly  GLP-1 agonist Ozempic.  Reports last dose 09/27/2022.  Prior history of OSA, pt reports since having gastric bypass surgery in 2015 she no longer requires CPAP.   NIDDM2, A1c 5.6 on preop labs.  Proep labs reviewed, creatinine elevated at 1.75, mild anemia with Hgb 10.7. Review of prior labs shows baseline mild renal insufficiency. Last labs 11/05/21 with creatinine 1.33. Will repeat Istat DOS to ensure it is not continuing to trend up.  EKG 10/30/2022: Atrial fibrillation. Rate 68. Low voltage QRS. Cannot rule out Anteroseptal infarct , age undetermined  Perioperative prescription for implanted cardiac device programming per progress note 10/31/2022: Device Information:  Clinic EP Physician:  Dr. Steffanie Dunn   Device Type:  Pacemaker Manufacturer and Phone #:  Medtronic: [phone] Pacemaker Dependent?:  No. Date of Last Device Check:  5/9/2024Normal Device Function?:  Yes.    Electrophysiologist's Recommendations:  ? Have magnet available. ? Provide continuous ECG monitoring when magnet is used or reprogramming is to be performed.  ? Procedure will likely interfere with device function.  Device should be programmed:  Asynchronous pacing during procedure and returned to normal  programming after procedure  Nuclear stress 04/08/2018: Summary:  1. LV EF was 52%  2. Global left ventricular systolic function was , with an EF of  52%.Negative study for ischemia or infarct.   TTE 11/18/19: 1. Left ventricular ejection fraction, by estimation, is 55 to 60%. The  left ventricle has normal function. The left ventricle has no regional  wall motion abnormalities. Left ventricular diastolic function could not  be evaluated.  2. Right ventricular systolic function is normal. The right ventricular  size is normal. There is normal pulmonary artery systolic pressure.  3. The mitral valve is abnormal. Trivial mitral valve regurgitation.  4. The aortic valve is tricuspid.  Aortic valve regurgitation is not  visualized.    )        Anesthesia Quick Evaluation

## 2022-10-31 NOTE — Patient Instructions (Addendum)
Medication Instructions:  Your physician recommends that you continue on your current medications as directed. Please refer to the Current Medication list given to you today.  *If you need a refill on your cardiac medications before your next appointment, please call your pharmacy*   Lab Work: No labs ordered  If you have labs (blood work) drawn today and your tests are completely normal, you will receive your results only by: MyChart Message (if you have MyChart) OR A paper copy in the mail If you have any lab test that is abnormal or we need to change your treatment, we will call you to review the results.   Testing/Procedures: No testing ordered  Follow-Up: At [facility], you and your health needs are our priority.  As part of our continuing mission to provide you with exceptional heart care, we have created designated Provider Care Teams.  These Care Teams include your primary Cardiologist (physician) and Advanced Practice Providers (APPs -  Physician Assistants and Nurse Practitioners) who all work together to provide you with the care you need, when you need it.  We recommend signing up for the patient portal called "MyChart".  Sign up information is provided on this After Visit Summary.  MyChart is used to connect with patients for Virtual Visits (Telemedicine).  Patients are able to view lab/test results, encounter notes, upcoming appointments, etc.  Non-urgent messages can be sent to your provider as well.   To learn more about what you can do with MyChart, go to [email or web address].    Your next appointment:   1 year(s)  Provider:   Steffanie Dunn, MD   Other Instructions Please check your blood pressure a few times per week and keep a log. Take readings to your next appointment with your primary care provider for management of blood pressure.  How to Take Your Blood Pressure: Blood pressure is a measurement of how strongly your blood is pressing against  the walls of your arteries. Arteries are blood vessels that carry blood from your heart throughout your body. Your health care provider takes your blood pressure at each office visit. You can also take your own blood pressure at home with a blood pressure monitor. You may need to take your own blood pressure to: Confirm a diagnosis of high blood pressure (hypertension). Monitor your blood pressure over time. Make sure your blood pressure medicine is working. Supplies needed: Blood pressure monitor. A chair to sit in. This should be a chair where you can sit upright with your back supported. Do not sit on a soft couch or an armchair. Table or desk. Small notebook and pencil or pen. How to prepare To get the most accurate reading, avoid the following for 30 minutes before you check your blood pressure: Drinking caffeine. Drinking alcohol. Eating. Smoking. Exercising. Five minutes before you check your blood pressure: Use the bathroom and urinate so that you have an empty bladder. Sit quietly in a chair. Do not talk. How to take your blood pressure To check your blood pressure, follow the instructions in the manual that came with your blood pressure monitor. If you have a digital blood pressure monitor, the instructions may be as follows: Sit up straight in a chair. Place your feet on the floor. Do not cross your ankles or legs. Rest your left arm at the level of your heart on a table or desk or on the arm of a chair. Pull up your shirt sleeve. Wrap the blood pressure cuff around the  upper part of your left arm, 1 inch (2.5 cm) above your elbow. It is best to wrap the cuff around bare skin. Fit the cuff snugly, but not too tightly, around your arm. You should be able to place only one finger between the cuff and your arm. Position the cord so that it rests in the bend of your elbow. Press the power button. Sit quietly while the cuff inflates and deflates. Read the digital reading on the  monitor screen and write the numbers down (record them) in a notebook. Wait 2-3 minutes, then repeat the steps, starting at step 1. What does my blood pressure reading mean? A blood pressure reading consists of a higher number over a lower number. Ideally, your blood pressure should be below 120/80. The first ("top") number is called the systolic pressure. It is a measure of the pressure in your arteries as your heart beats. The second ("bottom") number is called the diastolic pressure. It is a measure of the pressure in your arteries as the heart relaxes. Blood pressure is classified into four stages. The following are the stages for adults who do not have a short-term serious illness or a chronic condition. Systolic pressure and diastolic pressure are measured in a unit called mm Hg (millimeters of mercury).  Normal Systolic pressure: below 120. Diastolic pressure: below 80. Elevated Systolic pressure: 120-129. Diastolic pressure: below 80. Hypertension stage 1 Systolic pressure: 130-139. Diastolic pressure: 80-89. Hypertension stage 2 Systolic pressure: 140 or above. Diastolic pressure: 90 or above. You can have elevated blood pressure or hypertension even if only the systolic or only the diastolic number in your reading is higher than normal. Follow these instructions at home: Medicines Take over-the-counter and prescription medicines only as told by your health care provider. Tell your health care provider if you are having any side effects from blood pressure medicine. General instructions Check your blood pressure as often as recommended by your health care provider. Check your blood pressure at the same time every day. Take your monitor to the next appointment with your health care provider to make sure that: You are using it correctly. It provides accurate readings. Understand what your goal blood pressure numbers are. Keep all follow-up visits. This is important. General  tips Your health care provider can suggest a reliable monitor that will meet your needs. There are several types of home blood pressure monitors. Choose a monitor that has an arm cuff. Do not choose a monitor that measures your blood pressure from your wrist or finger. Choose a cuff that wraps snugly, not too tight or too loose, around your upper arm. You should be able to fit only one finger between your arm and the cuff. You can buy a blood pressure monitor at most drugstores or online. Where to find more information American Heart Association: www.heart.org Contact a health care provider if: Your blood pressure is consistently high. Your blood pressure is suddenly low. Get help right away if: Your systolic blood pressure is higher than 180. Your diastolic blood pressure is higher than 120. These symptoms may be an emergency. Get help right away. Call 911. Do not wait to see if the symptoms will go away. Do not drive yourself to the hospital. Summary Blood pressure is a measurement of how strongly your blood is pressing against the walls of your arteries. A blood pressure reading consists of a higher number over a lower number. Ideally, your blood pressure should be below 120/80. Check your blood pressure at  the same time every day. Avoid caffeine, alcohol, smoking, and exercise for 30 minutes prior to checking your blood pressure. These agents can affect the accuracy of the blood pressure reading. This information is not intended to replace advice given to you by your health care provider. Make sure you discuss any questions you have with your health care provider. Document Revised: 02/22/2021 Document Reviewed: 02/22/2021 Elsevier Patient Education  2023 [employer].

## 2022-11-05 ENCOUNTER — Other Ambulatory Visit: Payer: Self-pay

## 2022-11-05 ENCOUNTER — Inpatient Hospital Stay (HOSPITAL_COMMUNITY)
Admission: RE | Disposition: A | Discharge status: Home / Self Care | Payer: Self-pay | Source: Home / Self Care | Attending: Orthopedic Surgery

## 2022-11-05 ENCOUNTER — Inpatient Hospital Stay (HOSPITAL_COMMUNITY)
Admission: RE | Admit: 2022-11-05 | Discharge: 2022-11-09 | DRG: 519 | Disposition: A | Discharge status: Home / Self Care | Payer: Medicare Other | Attending: Orthopedic Surgery | Admitting: Orthopedic Surgery

## 2022-11-05 ENCOUNTER — Ambulatory Visit (HOSPITAL_BASED_OUTPATIENT_CLINIC_OR_DEPARTMENT_OTHER): Payer: Medicare Other | Admitting: Physician Assistant

## 2022-11-05 ENCOUNTER — Ambulatory Visit (HOSPITAL_COMMUNITY): Payer: Medicare Other | Admitting: Physician Assistant

## 2022-11-05 ENCOUNTER — Encounter (HOSPITAL_COMMUNITY): Payer: Self-pay | Admitting: Orthopedic Surgery

## 2022-11-05 ENCOUNTER — Ambulatory Visit (HOSPITAL_COMMUNITY): Payer: Medicare Other

## 2022-11-05 DIAGNOSIS — R001 Bradycardia, unspecified: Secondary | ICD-10-CM | POA: Diagnosis present

## 2022-11-05 DIAGNOSIS — Z87891 Personal history of nicotine dependence: Secondary | ICD-10-CM

## 2022-11-05 DIAGNOSIS — I10 Essential (primary) hypertension: Secondary | ICD-10-CM | POA: Diagnosis not present

## 2022-11-05 DIAGNOSIS — E1159 Type 2 diabetes mellitus with other circulatory complications: Secondary | ICD-10-CM

## 2022-11-05 DIAGNOSIS — Z7901 Long term (current) use of anticoagulants: Secondary | ICD-10-CM

## 2022-11-05 DIAGNOSIS — M48062 Spinal stenosis, lumbar region with neurogenic claudication: Secondary | ICD-10-CM | POA: Diagnosis not present

## 2022-11-05 DIAGNOSIS — I2510 Atherosclerotic heart disease of native coronary artery without angina pectoris: Secondary | ICD-10-CM

## 2022-11-05 DIAGNOSIS — M8938 Hypertrophy of bone, other site: Secondary | ICD-10-CM | POA: Diagnosis present

## 2022-11-05 DIAGNOSIS — Z4789 Encounter for other orthopedic aftercare: Secondary | ICD-10-CM | POA: Diagnosis not present

## 2022-11-05 DIAGNOSIS — G9741 Accidental puncture or laceration of dura during a procedure: Secondary | ICD-10-CM | POA: Diagnosis present

## 2022-11-05 DIAGNOSIS — Z950 Presence of cardiac pacemaker: Secondary | ICD-10-CM

## 2022-11-05 DIAGNOSIS — E119 Type 2 diabetes mellitus without complications: Secondary | ICD-10-CM

## 2022-11-05 DIAGNOSIS — K5900 Constipation, unspecified: Secondary | ICD-10-CM | POA: Diagnosis not present

## 2022-11-05 DIAGNOSIS — D62 Acute posthemorrhagic anemia: Secondary | ICD-10-CM | POA: Diagnosis present

## 2022-11-05 DIAGNOSIS — I4819 Other persistent atrial fibrillation: Secondary | ICD-10-CM | POA: Diagnosis present

## 2022-11-05 DIAGNOSIS — Z8616 Personal history of COVID-19: Secondary | ICD-10-CM

## 2022-11-05 DIAGNOSIS — Z9884 Bariatric surgery status: Secondary | ICD-10-CM

## 2022-11-05 DIAGNOSIS — E785 Hyperlipidemia, unspecified: Secondary | ICD-10-CM | POA: Diagnosis present

## 2022-11-05 DIAGNOSIS — Y838 Other surgical procedures as the cause of abnormal reaction of the patient, or of later complication, without mention of misadventure at the time of the procedure: Secondary | ICD-10-CM | POA: Diagnosis present

## 2022-11-05 DIAGNOSIS — E1140 Type 2 diabetes mellitus with diabetic neuropathy, unspecified: Secondary | ICD-10-CM | POA: Diagnosis present

## 2022-11-05 DIAGNOSIS — I152 Hypertension secondary to endocrine disorders: Secondary | ICD-10-CM

## 2022-11-05 HISTORY — PX: DECOMPRESSIVE LUMBAR LAMINECTOMY LEVEL 2: SHX5792

## 2022-11-05 LAB — POCT I-STAT, CHEM 8
BUN: 28 mg/dL — ABNORMAL HIGH (ref 8–23)
BUN: 23 mg/dL (ref 8–23)
BUN: 24 mg/dL — ABNORMAL HIGH (ref 8–23)
Calcium, Ion: 1.19 mmol/L (ref 1.15–1.40)
Calcium, Ion: 1.22 mmol/L (ref 1.15–1.40)
Calcium, Ion: 1.21 mmol/L (ref 1.15–1.40)
Chloride: 110 mmol/L (ref 98–111)
Chloride: 111 mmol/L (ref 98–111)
Chloride: 112 mmol/L — ABNORMAL HIGH (ref 98–111)
Creatinine, Ser: 1.6 mg/dL — ABNORMAL HIGH (ref 0.44–1.00)
Creatinine, Ser: 1.3 mg/dL — ABNORMAL HIGH (ref 0.44–1.00)
Creatinine, Ser: 1.3 mg/dL — ABNORMAL HIGH (ref 0.44–1.00)
Glucose, Bld: 71 mg/dL (ref 70–99)
Glucose, Bld: 92 mg/dL (ref 70–99)
Glucose, Bld: 87 mg/dL (ref 70–99)
HCT: 34 % — ABNORMAL LOW (ref 36.0–46.0)
HCT: 27 % — ABNORMAL LOW (ref 36.0–46.0)
HCT: 27 % — ABNORMAL LOW (ref 36.0–46.0)
Hemoglobin: 11.6 g/dL — ABNORMAL LOW (ref 12.0–15.0)
Hemoglobin: 9.2 g/dL — ABNORMAL LOW (ref 12.0–15.0)
Hemoglobin: 9.2 g/dL — ABNORMAL LOW (ref 12.0–15.0)
Potassium: 3.9 mmol/L (ref 3.5–5.1)
Potassium: 3.9 mmol/L (ref 3.5–5.1)
Potassium: 4.2 mmol/L (ref 3.5–5.1)
Sodium: 142 mmol/L (ref 135–145)
Sodium: 141 mmol/L (ref 135–145)
Sodium: 142 mmol/L (ref 135–145)
TCO2: 22 mmol/L (ref 22–32)
TCO2: 23 mmol/L (ref 22–32)
TCO2: 22 mmol/L (ref 22–32)

## 2022-11-05 LAB — GLUCOSE, CAPILLARY
Glucose-Capillary: 73 mg/dL (ref 70–99)
Glucose-Capillary: 70 mg/dL (ref 70–99)
Glucose-Capillary: 128 mg/dL — ABNORMAL HIGH (ref 70–99)
Glucose-Capillary: 92 mg/dL (ref 70–99)

## 2022-11-05 LAB — POCT I-STAT 7, (LYTES, BLD GAS, ICA,H+H)
Acid-base deficit: 6 mmol/L — ABNORMAL HIGH (ref 0.0–2.0)
Bicarbonate: 20 mmol/L (ref 20.0–28.0)
Calcium, Ion: 1.18 mmol/L (ref 1.15–1.40)
HCT: 28 % — ABNORMAL LOW (ref 36.0–46.0)
Hemoglobin: 9.5 g/dL — ABNORMAL LOW (ref 12.0–15.0)
O2 Saturation: 100 %
Potassium: 4.5 mmol/L (ref 3.5–5.1)
Sodium: 141 mmol/L (ref 135–145)
TCO2: 21 mmol/L — ABNORMAL LOW (ref 22–32)
pCO2 arterial: 38.8 mmHg (ref 32–48)
pH, Arterial: 7.32 — ABNORMAL LOW (ref 7.35–7.45)
pO2, Arterial: 206 mmHg — ABNORMAL HIGH (ref 83–108)

## 2022-11-05 LAB — TYPE AND SCREEN
ABO/RH(D): O POS
Antibody Screen: NEGATIVE

## 2022-11-05 SURGERY — DECOMPRESSIVE LUMBAR LAMINECTOMY LEVEL 2
Anesthesia: General

## 2022-11-05 MED ORDER — HYDROMORPHONE HCL 1 MG/ML IJ SOLN
INTRAMUSCULAR | Status: DC | PRN
  Administered 2022-11-05: .5 mg via INTRAVENOUS

## 2022-11-05 MED ORDER — PROPOFOL 10 MG/ML IV BOLUS
INTRAVENOUS | Status: DC | PRN
  Administered 2022-11-05: 120 mg via INTRAVENOUS

## 2022-11-05 MED ORDER — DEXTROSE 50 % IV SOLN
INTRAVENOUS | Status: DC | PRN
  Administered 2022-11-05: 12.5 mL via INTRAVENOUS

## 2022-11-05 MED ORDER — FENTANYL CITRATE (PF) 250 MCG/5ML IJ SOLN
INTRAMUSCULAR | Status: AC
  Filled 2022-11-05: qty 5

## 2022-11-05 MED ORDER — POLYETHYLENE GLYCOL 3350 17 G PO PACK
17.0000 g | PACK | Freq: Two times a day (BID) | ORAL | Status: DC
  Filled 2022-11-05 (×4): qty 1

## 2022-11-05 MED ORDER — LISINOPRIL 20 MG PO TABS
20.0000 mg | ORAL_TABLET | Freq: Every day | ORAL | Status: DC
  Administered 2022-11-06 – 2022-11-09 (×4): 20 mg via ORAL
  Filled 2022-11-05 (×4): qty 1

## 2022-11-05 MED ORDER — ONDANSETRON HCL 4 MG/2ML IJ SOLN
INTRAMUSCULAR | Status: DC | PRN
  Administered 2022-11-05: 4 mg via INTRAVENOUS

## 2022-11-05 MED ORDER — POVIDONE-IODINE 10 % EX SWAB
2.0000 | Freq: Once | CUTANEOUS | Status: AC
  Administered 2022-11-05: 2 via TOPICAL

## 2022-11-05 MED ORDER — CHLORHEXIDINE GLUCONATE 0.12 % MT SOLN
15.0000 mL | Freq: Once | OROMUCOSAL | Status: AC
  Administered 2022-11-05: 15 mL via OROMUCOSAL
  Filled 2022-11-05: qty 15

## 2022-11-05 MED ORDER — TRANEXAMIC ACID-NACL 1000-0.7 MG/100ML-% IV SOLN
1000.0000 mg | Freq: Once | INTRAVENOUS | Status: AC
  Administered 2022-11-05: 1000 mg via INTRAVENOUS
  Filled 2022-11-05: qty 100

## 2022-11-05 MED ORDER — ONDANSETRON HCL 4 MG/2ML IJ SOLN: 4.0000 mg | Freq: Once | INTRAMUSCULAR | Status: DC | PRN

## 2022-11-05 MED ORDER — LIDOCAINE 2% (20 MG/ML) 5 ML SYRINGE
INTRAMUSCULAR | Status: AC
  Filled 2022-11-05: qty 5

## 2022-11-05 MED ORDER — THROMBIN 20000 UNITS EX SOLR
CUTANEOUS | Status: DC | PRN
  Administered 2022-11-05: 20 mL via TOPICAL

## 2022-11-05 MED ORDER — PHENYLEPHRINE 80 MCG/ML (10ML) SYRINGE FOR IV PUSH (FOR BLOOD PRESSURE SUPPORT)
PREFILLED_SYRINGE | INTRAVENOUS | Status: AC
  Filled 2022-11-05: qty 10

## 2022-11-05 MED ORDER — THROMBIN 20000 UNITS EX KIT
PACK | CUTANEOUS | Status: AC
  Filled 2022-11-05: qty 1

## 2022-11-05 MED ORDER — ACETAMINOPHEN 10 MG/ML IV SOLN
INTRAVENOUS | Status: AC
  Filled 2022-11-05: qty 100

## 2022-11-05 MED ORDER — HYDROCHLOROTHIAZIDE 25 MG PO TABS
25.0000 mg | ORAL_TABLET | Freq: Every day | ORAL | Status: DC
  Administered 2022-11-06 – 2022-11-09 (×4): 25 mg via ORAL
  Filled 2022-11-05 (×4): qty 1

## 2022-11-05 MED ORDER — AMLODIPINE BESYLATE 10 MG PO TABS
10.0000 mg | ORAL_TABLET | Freq: Every day | ORAL | Status: DC
  Administered 2022-11-06 – 2022-11-09 (×4): 10 mg via ORAL
  Filled 2022-11-05 (×4): qty 1

## 2022-11-05 MED ORDER — OXYCODONE HCL 5 MG PO TABS: 5.0000 mg | ORAL_TABLET | Freq: Once | ORAL | Status: DC | PRN

## 2022-11-05 MED ORDER — ACETAMINOPHEN 10 MG/ML IV SOLN: 1000.0000 mg | Freq: Once | INTRAVENOUS | Status: DC | PRN

## 2022-11-05 MED ORDER — INSULIN ASPART 100 UNIT/ML IJ SOLN: 0.0000 [IU] | Freq: Three times a day (TID) | INTRAMUSCULAR | Status: DC

## 2022-11-05 MED ORDER — SPIRONOLACTONE 25 MG PO TABS
25.0000 mg | ORAL_TABLET | Freq: Every day | ORAL | Status: DC
  Administered 2022-11-07 – 2022-11-09 (×3): 25 mg via ORAL
  Filled 2022-11-05 (×3): qty 1

## 2022-11-05 MED ORDER — KETAMINE HCL 10 MG/ML IJ SOLN
INTRAMUSCULAR | Status: DC | PRN
  Administered 2022-11-05: 20 mg via INTRAVENOUS

## 2022-11-05 MED ORDER — ROCURONIUM BROMIDE 10 MG/ML (PF) SYRINGE
PREFILLED_SYRINGE | INTRAVENOUS | Status: AC
  Filled 2022-11-05: qty 10

## 2022-11-05 MED ORDER — OXYCODONE HCL 5 MG/5ML PO SOLN: 5.0000 mg | Freq: Once | ORAL | Status: DC | PRN

## 2022-11-05 MED ORDER — PHENYLEPHRINE HCL-NACL 20-0.9 MG/250ML-% IV SOLN
INTRAVENOUS | Status: DC | PRN
  Administered 2022-11-05: 25 ug/min via INTRAVENOUS

## 2022-11-05 MED ORDER — SERTRALINE HCL 100 MG PO TABS
100.0000 mg | ORAL_TABLET | Freq: Every day | ORAL | Status: DC
  Administered 2022-11-06 – 2022-11-09 (×4): 100 mg via ORAL
  Filled 2022-11-05 (×5): qty 1

## 2022-11-05 MED ORDER — DEXTROSE 50 % IV SOLN
INTRAVENOUS | Status: AC
  Filled 2022-11-05: qty 50

## 2022-11-05 MED ORDER — HYDROMORPHONE HCL 1 MG/ML IJ SOLN
INTRAMUSCULAR | Status: AC
  Filled 2022-11-05: qty 1

## 2022-11-05 MED ORDER — HYDROMORPHONE HCL 1 MG/ML IJ SOLN
0.2500 mg | INTRAMUSCULAR | Status: DC | PRN
  Administered 2022-11-05 (×4): 0.25 mg via INTRAVENOUS

## 2022-11-05 MED ORDER — LISINOPRIL-HYDROCHLOROTHIAZIDE 20-25 MG PO TABS: 1.0000 | ORAL_TABLET | Freq: Every day | ORAL | Status: DC

## 2022-11-05 MED ORDER — ACETAMINOPHEN 500 MG PO TABS
1000.0000 mg | ORAL_TABLET | Freq: Three times a day (TID) | ORAL | Status: DC
  Administered 2022-11-05 – 2022-11-09 (×11): 1000 mg via ORAL
  Filled 2022-11-05 (×11): qty 2

## 2022-11-05 MED ORDER — SODIUM CHLORIDE 0.9 % IV SOLN: INTRAVENOUS | Status: DC | PRN

## 2022-11-05 MED ORDER — VANCOMYCIN HCL 1000 MG IV SOLR
INTRAVENOUS | Status: DC | PRN
  Administered 2022-11-05: 1000 mg via TOPICAL

## 2022-11-05 MED ORDER — ONDANSETRON HCL 4 MG/2ML IJ SOLN
4.0000 mg | Freq: Four times a day (QID) | INTRAMUSCULAR | Status: DC | PRN
  Administered 2022-11-05 – 2022-11-07 (×3): 4 mg via INTRAVENOUS
  Filled 2022-11-05 (×3): qty 2

## 2022-11-05 MED ORDER — DEXAMETHASONE SODIUM PHOSPHATE 10 MG/ML IJ SOLN
INTRAMUSCULAR | Status: AC
  Filled 2022-11-05: qty 1

## 2022-11-05 MED ORDER — PHENYLEPHRINE 80 MCG/ML (10ML) SYRINGE FOR IV PUSH (FOR BLOOD PRESSURE SUPPORT)
PREFILLED_SYRINGE | INTRAVENOUS | Status: DC | PRN
  Administered 2022-11-05: 80 ug via INTRAVENOUS
  Administered 2022-11-05 (×2): 40 ug via INTRAVENOUS

## 2022-11-05 MED ORDER — MIDAZOLAM HCL 2 MG/2ML IJ SOLN
INTRAMUSCULAR | Status: AC
  Filled 2022-11-05: qty 2

## 2022-11-05 MED ORDER — SUGAMMADEX SODIUM 200 MG/2ML IV SOLN
INTRAVENOUS | Status: DC | PRN
  Administered 2022-11-05: 200 mg via INTRAVENOUS

## 2022-11-05 MED ORDER — CLINDAMYCIN PHOSPHATE 900 MG/50ML IV SOLN
900.0000 mg | Freq: Four times a day (QID) | INTRAVENOUS | Status: AC
  Administered 2022-11-05 – 2022-11-06 (×3): 900 mg via INTRAVENOUS
  Filled 2022-11-05 (×3): qty 50

## 2022-11-05 MED ORDER — TRANEXAMIC ACID-NACL 1000-0.7 MG/100ML-% IV SOLN
1000.0000 mg | INTRAVENOUS | Status: AC
  Administered 2022-11-05: 1000 mg via INTRAVENOUS
  Filled 2022-11-05: qty 100

## 2022-11-05 MED ORDER — CLINDAMYCIN PHOSPHATE 900 MG/50ML IV SOLN
900.0000 mg | INTRAVENOUS | Status: AC
  Administered 2022-11-05: 900 mg via INTRAVENOUS
  Filled 2022-11-05: qty 50

## 2022-11-05 MED ORDER — HYDROMORPHONE HCL 1 MG/ML IJ SOLN
INTRAMUSCULAR | Status: AC
  Filled 2022-11-05: qty 0.5

## 2022-11-05 MED ORDER — GABAPENTIN 300 MG PO CAPS
300.0000 mg | ORAL_CAPSULE | ORAL | Status: DC
  Administered 2022-11-05 – 2022-11-09 (×8): 300 mg via ORAL
  Filled 2022-11-05 (×8): qty 1

## 2022-11-05 MED ORDER — ROCURONIUM BROMIDE 10 MG/ML (PF) SYRINGE
PREFILLED_SYRINGE | INTRAVENOUS | Status: DC | PRN
  Administered 2022-11-05: 70 mg via INTRAVENOUS
  Administered 2022-11-05 (×3): 10 mg via INTRAVENOUS
  Administered 2022-11-05: 20 mg via INTRAVENOUS
  Administered 2022-11-05: 10 mg via INTRAVENOUS

## 2022-11-05 MED ORDER — ONDANSETRON HCL 4 MG/2ML IJ SOLN
INTRAMUSCULAR | Status: AC
  Filled 2022-11-05: qty 2

## 2022-11-05 MED ORDER — 0.9 % SODIUM CHLORIDE (POUR BTL) OPTIME
TOPICAL | Status: DC | PRN
  Administered 2022-11-05: 1000 mL

## 2022-11-05 MED ORDER — ONDANSETRON HCL 4 MG PO TABS
4.0000 mg | ORAL_TABLET | Freq: Four times a day (QID) | ORAL | Status: DC | PRN
  Administered 2022-11-07: 4 mg via ORAL
  Filled 2022-11-05: qty 1

## 2022-11-05 MED ORDER — TEDUGLUTIDE (RDNA) 5 MG ~~LOC~~ KIT: 5.0000 mg | PACK | Freq: Every day | SUBCUTANEOUS | Status: DC

## 2022-11-05 MED ORDER — KETAMINE HCL 50 MG/5ML IJ SOSY
PREFILLED_SYRINGE | INTRAMUSCULAR | Status: AC
  Filled 2022-11-05: qty 5

## 2022-11-05 MED ORDER — OXYCODONE HCL 5 MG/5ML PO SOLN
ORAL | Status: AC
  Filled 2022-11-05: qty 5

## 2022-11-05 MED ORDER — OXYCODONE HCL 5 MG PO TABS
5.0000 mg | ORAL_TABLET | ORAL | Status: DC | PRN
  Administered 2022-11-05 – 2022-11-08 (×6): 10 mg via ORAL
  Administered 2022-11-09: 5 mg via ORAL
  Filled 2022-11-05: qty 1
  Filled 2022-11-05 (×6): qty 2

## 2022-11-05 MED ORDER — HYDROMORPHONE HCL 1 MG/ML IJ SOLN
0.5000 mg | INTRAMUSCULAR | Status: AC | PRN
  Administered 2022-11-06: 1 mg via INTRAVENOUS
  Filled 2022-11-05: qty 1

## 2022-11-05 MED ORDER — BUPROPION HCL ER (XL) 150 MG PO TB24
150.0000 mg | ORAL_TABLET | Freq: Every day | ORAL | Status: DC
  Administered 2022-11-05 – 2022-11-09 (×5): 150 mg via ORAL
  Filled 2022-11-05 (×5): qty 1

## 2022-11-05 MED ORDER — METHOCARBAMOL 750 MG PO TABS
750.0000 mg | ORAL_TABLET | Freq: Four times a day (QID) | ORAL | Status: DC
  Administered 2022-11-05 – 2022-11-09 (×13): 750 mg via ORAL
  Filled 2022-11-05 (×15): qty 1

## 2022-11-05 MED ORDER — LIDOCAINE 2% (20 MG/ML) 5 ML SYRINGE
INTRAMUSCULAR | Status: DC | PRN
  Administered 2022-11-05: 40 mg via INTRAVENOUS

## 2022-11-05 MED ORDER — LACTATED RINGERS IV SOLN: INTRAVENOUS | Status: DC

## 2022-11-05 MED ORDER — FENTANYL CITRATE (PF) 250 MCG/5ML IJ SOLN
INTRAMUSCULAR | Status: DC | PRN
  Administered 2022-11-05 (×5): 50 ug via INTRAVENOUS

## 2022-11-05 MED ORDER — PROPOFOL 10 MG/ML IV BOLUS
INTRAVENOUS | Status: AC
  Filled 2022-11-05: qty 20

## 2022-11-05 MED ORDER — AMISULPRIDE (ANTIEMETIC) 5 MG/2ML IV SOLN: 10.0000 mg | Freq: Once | INTRAVENOUS | Status: DC | PRN

## 2022-11-05 MED ORDER — ATORVASTATIN CALCIUM 40 MG PO TABS
40.0000 mg | ORAL_TABLET | Freq: Every day | ORAL | Status: DC
  Administered 2022-11-05 – 2022-11-09 (×5): 40 mg via ORAL
  Filled 2022-11-05 (×5): qty 1

## 2022-11-05 MED ORDER — DEXAMETHASONE SODIUM PHOSPHATE 10 MG/ML IJ SOLN
10.0000 mg | Freq: Once | INTRAMUSCULAR | Status: AC
  Administered 2022-11-05: 10 mg via INTRAVENOUS
  Filled 2022-11-05: qty 1

## 2022-11-05 MED ORDER — INSULIN ASPART 100 UNIT/ML IJ SOLN: 0.0000 [IU] | Freq: Every day | INTRAMUSCULAR | Status: DC

## 2022-11-05 MED ORDER — VANCOMYCIN HCL 1000 MG IV SOLR
INTRAVENOUS | Status: AC
  Filled 2022-11-05: qty 20

## 2022-11-05 MED ORDER — DOCUSATE SODIUM 100 MG PO CAPS
100.0000 mg | ORAL_CAPSULE | Freq: Two times a day (BID) | ORAL | Status: DC
  Administered 2022-11-08 – 2022-11-09 (×2): 100 mg via ORAL
  Filled 2022-11-05 (×7): qty 1

## 2022-11-05 MED ORDER — MIDAZOLAM HCL 2 MG/2ML IJ SOLN
INTRAMUSCULAR | Status: DC | PRN
  Administered 2022-11-05: 2 mg via INTRAVENOUS

## 2022-11-05 MED ORDER — ORAL CARE MOUTH RINSE: 15.0000 mL | Freq: Once | OROMUCOSAL | Status: AC

## 2022-11-05 SURGICAL SUPPLY — 62 items
AGENT HMST KT MTR STRL THRMB (HEMOSTASIS) ×1
APL SKNCLS STERI-STRIP NONHPOA (GAUZE/BANDAGES/DRESSINGS) ×1
BENZOIN TINCTURE PRP APPL 2/3 (GAUZE/BANDAGES/DRESSINGS) IMPLANT
BUR NEURO DRILL SOFT 3.0X3.8M (BURR) ×2 IMPLANT
CANISTER SUCT 3000ML PPV (MISCELLANEOUS) ×2 IMPLANT
CLSR STERI-STRIP ANTIMIC 1/2X4 (GAUZE/BANDAGES/DRESSINGS) IMPLANT
CORD BIPOLAR FORCEPS 12FT (ELECTRODE) ×2 IMPLANT
COVER MAYO STAND STRL (DRAPES) ×2 IMPLANT
COVER SURGICAL LIGHT HANDLE (MISCELLANEOUS) ×2 IMPLANT
DRAPE C-ARM 42X72 X-RAY (DRAPES) ×2 IMPLANT
DRAPE MICROSCOPE LEICA 54X105 (DRAPES) ×2 IMPLANT
DRAPE MICROSCOPE NONGLARE (MISCELLANEOUS) IMPLANT
DRAPE SURG 17X11 SM STRL (DRAPES) IMPLANT
DRAPE UTILITY XL STRL (DRAPES) ×2 IMPLANT
DRESSING MEPILEX FLEX 4X4 (GAUZE/BANDAGES/DRESSINGS) ×2 IMPLANT
DRSG MEPILEX FLEX 4X4 (GAUZE/BANDAGES/DRESSINGS) ×1
DRSG TEGADERM 4X10 (GAUZE/BANDAGES/DRESSINGS) ×2 IMPLANT
DRSG TEGADERM 4X4.5 CHG (GAUZE/BANDAGES/DRESSINGS) IMPLANT
DURAPREP 26ML APPLICATOR (WOUND CARE) ×2 IMPLANT
DURASEAL SPINE SEALANT 5 POLY (MISCELLANEOUS) IMPLANT
ELECT BLADE 4.0 EZ CLEAN MEGAD (MISCELLANEOUS) ×1
ELECT PENCIL ROCKER SW 15FT (MISCELLANEOUS) ×2 IMPLANT
ELECT REM PT RETURN 9FT ADLT (ELECTROSURGICAL) ×1
ELECTRODE BLDE 4.0 EZ CLN MEGD (MISCELLANEOUS) ×2 IMPLANT
ELECTRODE REM PT RTRN 9FT ADLT (ELECTROSURGICAL) ×2 IMPLANT
EVACUATOR 1/8 PVC DRAIN (DRAIN) IMPLANT
GAUZE SPONGE 4X4 12PLY STRL (GAUZE/BANDAGES/DRESSINGS) IMPLANT
GLOVE BIO SURGEON STRL SZ7.5 (GLOVE) ×2 IMPLANT
GLOVE BIOGEL PI IND STRL 7.5 (GLOVE) IMPLANT
GLOVE INDICATOR 7.5 STRL GRN (GLOVE) ×2 IMPLANT
GOWN STRL REUS W/ TWL LRG LVL3 (GOWN DISPOSABLE) ×2 IMPLANT
GOWN STRL REUS W/TWL LRG LVL3 (GOWN DISPOSABLE) ×1
GOWN STRL SURGICAL XL XLNG (GOWN DISPOSABLE) ×2 IMPLANT
GRAFT DURAGEN MATRIX 2WX2L IMPLANT
KIT BASIN OR (CUSTOM PROCEDURE TRAY) ×2 IMPLANT
KIT POSITION SURG JACKSON T1 (MISCELLANEOUS) ×2 IMPLANT
KIT TURNOVER KIT B (KITS) ×2 IMPLANT
NDL 22X1.5 STRL (OR ONLY) (MISCELLANEOUS) ×2 IMPLANT
NEEDLE 22X1.5 STRL (OR ONLY) (MISCELLANEOUS) ×1 IMPLANT
NS IRRIG 1000ML POUR BTL (IV SOLUTION) ×2 IMPLANT
PACK LAMINECTOMY ORTHO (CUSTOM PROCEDURE TRAY) ×2 IMPLANT
PATTIES SURGICAL .5 X.5 (GAUZE/BANDAGES/DRESSINGS) ×2 IMPLANT
SPONGE SURGIFOAM ABS GEL 100 (HEMOSTASIS) ×2 IMPLANT
SPONGE T-LAP 4X18 ~~LOC~~+RFID (SPONGE) ×2 IMPLANT
SUCTION FRAZIER HANDLE 10FR (MISCELLANEOUS) ×1
SUCTION TUBE FRAZIER 10FR DISP (MISCELLANEOUS) ×2 IMPLANT
SURGIFLO W/THROMBIN 8M KIT (HEMOSTASIS) IMPLANT
SUT BONE WAX W31G (SUTURE) ×2 IMPLANT
SUT MNCRL+ AB 3-0 CT1 36 (SUTURE) ×2 IMPLANT
SUT NYLON 3 0 (SUTURE) IMPLANT
SUT PROLENE 0 CT 2 (SUTURE) IMPLANT
SUT PROLENE 6 0 BV (SUTURE) IMPLANT
SUT PROLENE 6 0 C 1 30 (SUTURE) IMPLANT
SUT VIC AB 0 CT1 18XCR BRD8 (SUTURE) ×2 IMPLANT
SUT VIC AB 0 CT1 8-18 (SUTURE) ×1
SUT VIC AB 2-0 CT1 18 (SUTURE) ×2 IMPLANT
SYR BULB IRRIG 60ML STRL (SYRINGE) ×2 IMPLANT
SYR CONTROL 10ML LL (SYRINGE) ×2 IMPLANT
TOWEL GREEN STERILE (TOWEL DISPOSABLE) ×2 IMPLANT
TOWEL GREEN STERILE FF (TOWEL DISPOSABLE) ×2 IMPLANT
TUBING FEATHERFLOW (TUBING) ×2 IMPLANT
WATER STERILE IRR 1000ML POUR (IV SOLUTION) ×2 IMPLANT

## 2022-11-05 NOTE — Transfer of Care (Signed)
Immediate Anesthesia Transfer of Care Note  Patient: [name]  Procedure(s) Performed: L3-4, L4-5 DECOMPRESSIVE LUMBAR LAMINECTOMY LEVEL 2  Patient Location: PACU  Anesthesia Type:General  Level of Consciousness: drowsy and patient cooperative  Airway & Oxygen Therapy: Patient connected to face mask oxygen  Post-op Assessment: Report given to RN and Post -op Vital signs reviewed and stable  Post vital signs: Reviewed and stable  Last Vitals:  Vitals Value Taken Time  BP 151/74 11/05/22 1430  Temp    Pulse 93 11/05/22 1431  Resp 17 11/05/22 1431  SpO2 98 % 11/05/22 1431  Vitals shown include unvalidated device data.  Last Pain:  Vitals:   11/05/22 0647  TempSrc:   PainSc: 0-No pain         Complications: There were no known notable events for this encounter.

## 2022-11-05 NOTE — Progress Notes (Signed)
Pt unsure of when she last took her Eliquis. Pt states she took it either Sunday morning 11/03/22 or on Saturday. Dr. Richardson Landry and Dr. Christell Constant notified. OK to proceed with surgery per Dr. Christell Constant. T/S ordered per Dr. Richardson Landry. CRNA notified to draw T/S.   Dr. Richardson Landry notified of orders and confirmed that PPM needs to be re-programmed per cardiology order. [place] device rep with [employer] notified that rep needs to be present to re-program PPM device per cardiology orders.

## 2022-11-05 NOTE — Progress Notes (Signed)
Orthopedic Surgery Post-operative Progress Note  Assessment: Patient is a 67 y.o. female who is currently admitted after undergoing L3-5 laminectomies and foraminotomies. Complicated by dural tear s/p repair   Plan: -Operative plans complete -Bedrest for today, will raise head of bed starting tomorrow -Drains to be maintained until output slows. Deep drain to gravity only -No bending/lifting/twisting greater than 10 pounds -PT evaluate and treat -Pain control -Diabetic diet -No chemoprophylaxis for dvt or antiplatelets for 72 hours after surgery -Clindamycin x2 post-operative doses -Disposition: admit to floor from PACU  Diabetes -sliding scale insulin -monitor blood glucose  Hyperlipidemia -continue home lipitor  Hypertension -continue home amlodipine, spironolactone, lisinopril-HCTZ  Neuropathy -continue home gabapentin  ___________________________________________________________________________   Subjective: No acute events since surgery. Pain well controlled. Not having any headaches. No radiating pain into either lower extremity. Denies paresthesias or numbness.   Objective:  General: no acute distress, appropriate affect Neurologic: alert, answering questions appropriately, following commands Respiratory: unlabored breathing on room air Skin: dressing clear/dry/intact, drains with minimal sanguinous output  MSK (spine):  -Strength exam      Right  Left  EHL    4/5  4/5 TA    4/5  5/5 GSC    5/5  5/5 Knee extension  5/5  5/5 Hip flexion   5/5  5/5  -Sensory exam    Sensation intact to light touch in L3-S1 nerve distributions of bilateral lower extremities   Patient name: [name] Patient MRN: [id number] Date: 11/05/22

## 2022-11-05 NOTE — Op Note (Addendum)
Orthopedic Spine Surgery Operative Report  Procedure: L3, L4, L5 lumbar laminectomies with foraminotomies  Modifier: none  Date of procedure: 11/05/2022  Patient name: [name] MRN: [id number] DOB: 03/27/1956  Surgeon: Willia Craze, MD Assistant: None Pre-operative diagnosis: lumbar stenosis at L3/4 and L4/5 Post-operative diagnosis: same as above Findings: L3/4 and L4/5 hypertrophic facets and thickened ligamentum flavum  Specimens: none Anesthesia: general EBL: 300cc Complications: dural tear s/p repair Pre-incision antibiotic: clindamycin TXA given prior to incision as well  Implants: none   Indication for procedure: Patient is a 67 y.o. female who presented to the office with symptoms consistent with neurogenic claudication. The patient had tried conservative treatments that did not provide any lasting relief. As result, operative management was discussed. The pre-operative MRI showed stenosis from L3-L5 so a L3, L4,L5 laminectomy with foraminotomies was presented as a treatment option. The risks including but not limited to iatrogenic instability, dural tear, nerve root injury, paralysis, persistent pain, infection, bleeding, heart attack, death, stroke, fracture, blindness, and need for additional procedures were discussed with the patient. The benefit of the surgery would be relief of the patient's leg pain. The alternatives to surgical management were covered with the patient and included continued monitoring, physical therapy, over-the-counter pain medications, ambulatory aids, repeat injections, and activity modification. All the patient's questions were answered to her satisfaction. After this discussion, the patient expressed understanding and elected to proceed with surgical intervention.   Procedure Description: The patient was met in the pre-operative holding area. The patient's identity and consent were verified. The operative site was marked. The patient's remaining  questions about the surgery were answered. The patient was brought back to the operating room. General anesthesia was induced and an endotracheal tube was placed by the anesthesia staff. The patient was transferred to the prone [place] table in the prone position. All bony prominences were well padded. The head of the bed was slightly elevated and the eyes were free from compression by the face pillow. The surgical area was cleansed with alcohol. Fluoroscopy was then brought in to check rotation on the AP image and to mark the levels on the lateral image. The patient's skin was then prepped and draped in a standard, sterile fashion. A time out was performed that identified the patient, the procedure, and the operative levels. All team members agreed with what was stated in the time out.   A midline incision over the spinous processes of the previously marked levels was made and sharp dissection was continued down through the skin and dermis. Electrocautery was then used to continue the midline dissection down to the level of the spinous process. Subperiosteal dissection was performed using electrocautery to expose the lamina out lateral to the facet joint capsule. Care was taken to not violate the facet joint capsules. A lateral fluoroscopic image was taken to confirm the level. Subperiosteal dissection with electrocautery was then done to expose the lamina and pars of L3, L4, and L5. Again, care was taken to avoid disruption of the facet capsules.    A rongeur was used to remove the spinous processes and interspinous ligaments between the L2-3 interspinous ligament to the cranial aspect of the L5 spinous process. Bone wax was used to obtain hemostasis at the bleeding bony surfaces. A high-speed burr was used to thin the lamina at all planned laminectomy levels to the level of the ligamentum flavum. Above the level of the ligamentum, the lamina was thinned with the burr to the approximate level of the  ligamentum. Care was taken to leave at least 8mm of pars interarticularis on each side. A series of Kerrison rongeurs were used to remove the remaining lamina and ligamentum overlying the thecal sac. A 3mm kerrison was used to remove the medial aspect of the L3/4 and L4/5 facet joints bilaterally. On the right, just cranial to the L5 pedicle, a dural tear occurred when using the kerrison. A piece of gel foam was placed over it temporarily. Next, a woodsen was placed into the L3/4 foramen and a kerrison was used over the woodsen to clear the soft tissue and bone overlying the exiting nerve root. The same process was repeated for the contralateral side and for the L4/5 foramen bilaterally.    A woodsen was placed into the laminectomy site to palpate for any remaining areas of stenosis. Once it was confirmed with the woodsen that decompression had been completed from the medial pedicle wall to the contralateral medial pedicle wall and from the pedicle of L3 to the pedicle of the L5, decompression was determined to be completed. A lateral fluoroscopic film was taken with a woodsen in the cranial aspect of the laminectomy and then another image with the woodsen in the caudal aspect to demonstrate the levels decompressed.   Next, attention was turned to the durotomy site. Operative microscope was brought in to aid in visualization and illumination. The gel foam was removed. There was CSF seen leaking from the defect. A nerve hook was placed into the defect to elevate the dural off the nerve roots. 6-0 prolene was used to close the defect in a simple interrupted fashion. There was still CSF leaking at the more caudal aspect of the defect. 2 more 6-0 prolene simple interrupted sutures were used to obtain a watertight closure. At this point, no further CSF was seen leaking into the wound. A Valsalva maneuver was done twice by anesthesia, each time for 30 seconds, and no CSF was seen coming into the field. A duragen patch  was then placed over the repair and underneath the remaining lamina laterally on both sides and underneath the remaining L5 lamina caudally. DuraSeal exact was then misted over the duragen patch.   1g of vancomycin powder was placed into the wound. A medium hemovac drain was placed deep to the fascia. The fascia was reapproximated with 0 prolene suture in a watertight fashion. A subcutaneous hemovac drain was placed above the fascia. The deep dermal layer was reapproximated with 2-0 viryl. The skin as closed with a 3-0 running moncryl. All counts were correct at the end of the case. The incision was dressed with steri strips and benzoine. An island dressing was placed over the wound. The patient was transferred back to a bed and brought to the post-anesthesia care unit by anesthesia staff in stable condition.  Post-operative plan: The patient will recover in the post-anesthesia care unit and then go to the floor. The patient will receive two post-operative doses of clindamycin. The patient will be bed rest  with the head of bed flat for today. The plan will be to allow for head of bed elevation starting tomorrow. The deep drain will be to gravity only. The superficial drain will be to suction. Eventually, the patient will work with physical therapy. The drains will likely be removed on the day of discharge, pending output. The patient's disposition will be determined based off how she does on the floor post-operatively.       Willia Craze, MD Orthopedic Surgeon

## 2022-11-05 NOTE — Brief Op Note (Signed)
11/05/2022  2:15 PM  PATIENT:  [name]  67 y.o. female  PRE-OPERATIVE DIAGNOSIS:  LUMBAR STENOSIS WITH NEUROGENIC CLAUDICATION  POST-OPERATIVE DIAGNOSIS:  Same as above  PROCEDURE:  Procedure(s): L3-4, L4-5 DECOMPRESSIVE LUMBAR LAMINECTOMY LEVEL 2 (N/A)  SURGEON:  Surgeon(s) and Role:    London Sheer, MD - Primary  PHYSICIAN ASSISTANT:   ASSISTANTS: none   ANESTHESIA:   none  EBL:  350 mL   BLOOD ADMINISTERED:none  DRAINS:  2 medium hemovacs in the back    LOCAL MEDICATIONS USED:  NONE  SPECIMEN:  No Specimen  DISPOSITION OF SPECIMEN:  N/A  COUNTS:  YES  TOURNIQUET: NONE  DICTATION: .Note written in EPIC  PLAN OF CARE: Admit to observation  PATIENT DISPOSITION:  PACU - hemodynamically stable.   Delay start of Pharmacological VTE agent (>24hrs) due to surgical blood loss or risk of bleeding: yes

## 2022-11-05 NOTE — Plan of Care (Signed)

## 2022-11-05 NOTE — Anesthesia Procedure Notes (Signed)
Procedure Name: Intubation Date/Time: 11/05/2022 8:14 AM  Performed by: Sharyn Dross, CRNAPre-anesthesia Checklist: Patient identified, Emergency Drugs available, Suction available and Patient being monitored Patient Re-evaluated:Patient Re-evaluated prior to induction Oxygen Delivery Method: Circle system utilized Preoxygenation: Pre-oxygenation with 100% oxygen Induction Type: IV induction Ventilation: Mask ventilation without difficulty Laryngoscope Size: Mac Grade View: Grade I Tube type: Oral Tube size: 7.0 mm Number of attempts: 1 Airway Equipment and Method: Stylet and Oral airway Placement Confirmation: ETT inserted through vocal cords under direct vision, positive ETCO2 and breath sounds checked- equal and bilateral Secured at: 22 cm Tube secured with: Tape Dental Injury: Teeth and Oropharynx as per pre-operative assessment

## 2022-11-05 NOTE — H&P (Signed)
Orthopedic Spine Surgery H&P Note  Assessment: Patient is a 67 y.o. female with back pain that radiates into bilateral lower extremities consistent with neurogenic claudication   Plan: -Out of bed as tolerated, activity as tolerated, no brace -Covered the risks of surgery one more time with the patient and patient elected to proceed with planned surgery -Written consent verified -Hold anticoagulation in anticipation of surgery -Ancef and TXA on all to OR -NPO for procedure -Site marked -To OR when ready   The patient has symptoms consistent with neurogenic claudication. The patient's symptoms did not improve with conservative treatment so operative management was discussed in the form of L3, L4, L5 segment laminectomies and bilateral foraminotomies. The risks including but not limited to iatrogenic instability, dural tear, nerve root injury, paralysis, persistent pain, infection, bleeding, heart attack, death, stroke, fracture, and need for additional procedures were discussed with the patient. The benefit of the surgery would be improvement in the patient's bilateral leg pain. I explained that back pain relief is not the goal of the surgery and it is not reliably alleviated with this surgery. The alternatives to surgical management were covered with the patient and included continued monitoring, physical therapy, over-the-counter pain medications, ambulatory aids, injections, and activity modification. All the patient's questions were answered to her satisfaction. After this discussion, the patient expressed understanding and elected to proceed with surgical intervention.  ___________________________________________________________________________  Chief Complaint: low back pain that radiates into bilateral lower extremities  History: Patient is 67 y.o. female who has been previously seen in the office for low back pain that radiates into bilateral lower extremities. Work up was consistent with  lumbar stenosis causing neurogenic claudication. These symptoms failed to improve with conservative treatment so operative management was discussed at the last office visit. The patient presents today with no changes in her symptoms since the last office visit. See previous office note for further details.    Review of systems: General: denies fevers and chills, myalgias Neurologic: denies recent changes in vision, slurred speech Abdomen: denies nausea, vomiting, hematemesis Respiratory: denies cough, shortness of breath  Past medical history: Hyperlipidemia Hypertension Diabetes  Neuropathy Sleep apnea Short-bowel syndrome   Allergies: cephalexin, latex, neomycin, tape   Past surgical history:  Cataract surgery Pacemaker insertion Gastric bypass Knee replacements Laparoscopic intestinal surgery Left leg surgery Toothless extraction Parathyroidectomy Tonsillectomy Varicose vein surgery    Social history: Denies use of nicotine product (smoking, vaping, patches, smokeless) Alcohol use: denies Denies recreational drug use  Family history: -reviewed and not pertinent to lumbar stenosis with neurogenic claudication   Physical Exam:  General: no acute distress, appears stated age Neurologic: alert, answering questions appropriately, following commands Cardiovascular: regular rate, no cyanosis Respiratory: unlabored breathing on room air, symmetric chest rise Psychiatric: appropriate affect, normal cadence to speech   MSK (spine):  -Strength exam      Left  Right  EHL    4/5  4/5 TA    5/5  4/5 GSC    5/5  5/5 Knee extension  5/5  5/5 Knee flexion   5/5  5/5 Hip flexion   5/5  5/5  -Sensory exam    Sensation intact to light touch in L3-S1 nerve distributions of bilateral lower extremities   Patient name: [name] Patient MRN: [id number] Date: 11/05/22

## 2022-11-06 ENCOUNTER — Encounter (HOSPITAL_COMMUNITY): Payer: Self-pay | Admitting: Orthopedic Surgery

## 2022-11-06 DIAGNOSIS — Z8616 Personal history of COVID-19: Secondary | ICD-10-CM | POA: Diagnosis not present

## 2022-11-06 DIAGNOSIS — I10 Essential (primary) hypertension: Secondary | ICD-10-CM | POA: Diagnosis present

## 2022-11-06 DIAGNOSIS — D62 Acute posthemorrhagic anemia: Secondary | ICD-10-CM | POA: Diagnosis present

## 2022-11-06 DIAGNOSIS — E785 Hyperlipidemia, unspecified: Secondary | ICD-10-CM | POA: Diagnosis present

## 2022-11-06 DIAGNOSIS — Z87891 Personal history of nicotine dependence: Secondary | ICD-10-CM | POA: Diagnosis not present

## 2022-11-06 DIAGNOSIS — Z950 Presence of cardiac pacemaker: Secondary | ICD-10-CM | POA: Diagnosis not present

## 2022-11-06 DIAGNOSIS — Z7901 Long term (current) use of anticoagulants: Secondary | ICD-10-CM | POA: Diagnosis not present

## 2022-11-06 DIAGNOSIS — M8938 Hypertrophy of bone, other site: Secondary | ICD-10-CM | POA: Diagnosis present

## 2022-11-06 DIAGNOSIS — R001 Bradycardia, unspecified: Secondary | ICD-10-CM | POA: Diagnosis present

## 2022-11-06 DIAGNOSIS — M48062 Spinal stenosis, lumbar region with neurogenic claudication: Secondary | ICD-10-CM | POA: Diagnosis present

## 2022-11-06 DIAGNOSIS — Y838 Other surgical procedures as the cause of abnormal reaction of the patient, or of later complication, without mention of misadventure at the time of the procedure: Secondary | ICD-10-CM | POA: Diagnosis present

## 2022-11-06 DIAGNOSIS — G9741 Accidental puncture or laceration of dura during a procedure: Secondary | ICD-10-CM | POA: Diagnosis present

## 2022-11-06 DIAGNOSIS — I4819 Other persistent atrial fibrillation: Secondary | ICD-10-CM | POA: Diagnosis present

## 2022-11-06 DIAGNOSIS — Z9884 Bariatric surgery status: Secondary | ICD-10-CM | POA: Diagnosis not present

## 2022-11-06 DIAGNOSIS — E1140 Type 2 diabetes mellitus with diabetic neuropathy, unspecified: Secondary | ICD-10-CM | POA: Diagnosis present

## 2022-11-06 DIAGNOSIS — K5900 Constipation, unspecified: Secondary | ICD-10-CM | POA: Diagnosis not present

## 2022-11-06 LAB — GLUCOSE, CAPILLARY
Glucose-Capillary: 78 mg/dL (ref 70–99)
Glucose-Capillary: 100 mg/dL — ABNORMAL HIGH (ref 70–99)
Glucose-Capillary: 101 mg/dL — ABNORMAL HIGH (ref 70–99)
Glucose-Capillary: 78 mg/dL (ref 70–99)

## 2022-11-06 LAB — CBC
HCT: 28.9 % — ABNORMAL LOW (ref 36.0–46.0)
Hemoglobin: 9.1 g/dL — ABNORMAL LOW (ref 12.0–15.0)
MCH: 27.7 pg (ref 26.0–34.0)
MCHC: 31.5 g/dL (ref 30.0–36.0)
MCV: 88.1 fL (ref 80.0–100.0)
Platelets: 192 10*3/uL (ref 150–400)
RBC: 3.28 MIL/uL — ABNORMAL LOW (ref 3.87–5.11)
RDW: 13.9 % (ref 11.5–15.5)
WBC: 6.3 10*3/uL (ref 4.0–10.5)
nRBC: 0 % (ref 0.0–0.2)

## 2022-11-06 LAB — BASIC METABOLIC PANEL
Anion gap: 10 (ref 5–15)
BUN: 19 mg/dL (ref 8–23)
CO2: 20 mmol/L — ABNORMAL LOW (ref 22–32)
Calcium: 8.2 mg/dL — ABNORMAL LOW (ref 8.9–10.3)
Chloride: 110 mmol/L (ref 98–111)
Creatinine, Ser: 1.4 mg/dL — ABNORMAL HIGH (ref 0.44–1.00)
GFR, Estimated: 41 mL/min — ABNORMAL LOW (ref >60)
Glucose, Bld: 84 mg/dL (ref 70–99)
Potassium: 4 mmol/L (ref 3.5–5.1)
Sodium: 140 mmol/L (ref 135–145)

## 2022-11-06 MED FILL — Thrombin For Soln Kit 20000 Unit: CUTANEOUS | Qty: 1 | Status: AC

## 2022-11-06 MED FILL — Thrombin (Recombinant) For Soln 20000 Unit: CUTANEOUS | Qty: 1 | Status: CN

## 2022-11-06 NOTE — Progress Notes (Addendum)
Orthopedic Surgery Post-operative Progress Note  Assessment: Patient is a 67 y.o. female who is currently admitted after undergoing L3-5 laminectomies and foraminotomies. Complicated by dural tear s/p repair   Plan: -Operative plans complete -Out of bed as tolerated, no brace needed -Drains to be maintained until output slows. Deep drain to gravity only -No bending/lifting/twisting greater than 10 pounds -PT evaluate and treat -Pain control -Diabetic diet -No chemoprophylaxis for dvt or antiplatelets for 72 hours after surgery -Clindamycin x2 post-operative doses -Disposition: remain floor status  Diabetes -sliding scale insulin -monitor blood glucose  Hyperlipidemia -continue home lipitor  Hypertension -continue home amlodipine, spironolactone, lisinopril-HCTZ  Neuropathy -continue home gabapentin  Acute anemia from surgical blood loss -Hgb was 11.6 pre-operatively and was 9.1 on 11/06/2022 -will monitor for signs/symptoms of anemia  Discussed the dural tear again with the patient this morning. Explained that since she is not having any headaches this morning. She can be out of bed as tolerated with no brace. I asked her to pay attention to any nausea, photophobia or headache and let me know if she develops any of these symptoms.  ___________________________________________________________________________   Subjective: No acute events overnight. Was flat all of yesterday evening. Having a lot of back pain but no radiating leg pain. Oxycodone is controlling her pain. Denies paresthesias and numbness.   Was in her room for 30 minutes and gradually raised her head of bed. Eventually, her head of bed got to 50 degrees this morning without any headaches.   Objective:  General: no acute distress, appropriate affect Neurologic: alert, answering questions appropriately, following commands Respiratory: unlabored breathing on room air Skin: dressing clear/dry/intact, drains  with minimal sanguinous output. No clear fluid seen in the deep drain  MSK (spine):  -Strength exam      Right  Left  EHL    4/5  4/5 TA    4/5  5/5 GSC    5/5  5/5 Knee extension  5/5  5/5 Hip flexion   5/5  5/5  -Sensory exam    Sensation intact to light touch in L3-S1 nerve distributions of bilateral lower extremities   Patient name: [name] Patient MRN: [id number] Date: 11/06/22

## 2022-11-06 NOTE — Progress Notes (Signed)
Patient has pulled both drains out and placed them on the table next to her. When questioned, she is unable to state why she did this, despite being fully able to answer all orientation questions. Message sent to orthopedic surgeon.

## 2022-11-06 NOTE — Evaluation (Signed)
Occupational Therapy Evaluation Patient Details Name: [name] MRN: [id number] DOB: 1955/08/01 Today's Date: 11/06/2022   History of Present Illness 67 y.o. female who is currently admitted after undergoing L3-5 laminectomies and foraminotomies. Complicated by dural tear s/p repair   Clinical Impression   Pt admitted with the above diagnosis. Pt currently with functional limitations due to the deficits listed below (see OT Problem List). Prior to admit, pt was at home with her husband providing 24/7 assistance. Pt was independent with ADL and functional mobility tasks. She has a history of falls. Pt will benefit from acute skilled OT to increase their safety and independence with ADL and functional mobility for ADL to facilitate discharge. Patient will benefit from continued inpatient follow up therapy, <3 hours/day. If pt progresses in acute, she will be able to discharge home with HHOT. OT will continue to follow patient acutely.   Patient is confined to a single room and not able to walk the distance required to go the bathroom, or he/she is unable to safely negotiate stairs required to access the bathroom.  A 3in1 BSC will alleviate this problem         Recommendations for follow up therapy are one component of a multi-disciplinary discharge planning process, led by the attending physician.  Recommendations may be updated based on patient status, additional functional criteria and insurance authorization.   Assistance Recommended at Discharge Frequent or constant Supervision/Assistance  Patient can return home with the following A little help with walking and/or transfers;A little help with bathing/dressing/bathroom;Help with stairs or ramp for entrance;Assist for transportation;Assistance with cooking/housework    Functional Status Assessment  Patient has had a recent decline in their functional status and demonstrates the ability to make significant improvements in function in a  reasonable and predictable amount of time.  Equipment Recommendations  BSC/3in1       Precautions / Restrictions Precautions Precautions: Back Precaution Booklet Issued: Yes (comment) Precaution Comments: verbal instructions provided for no bending, lifting, twisting. Pt verbalize understanding. No brace required per MD. Restrictions Weight Bearing Restrictions: No      Mobility Bed Mobility Overal bed mobility: Needs Assistance Bed Mobility: Rolling, Sidelying to Sit Rolling: Mod assist, +2 for physical assistance Sidelying to sit: Mod assist, +2 for physical assistance, HOB elevated       General bed mobility comments: VC for log rolling technique and use bed rail to assist with rolling Due to pain, more assistance was provided. VC to use compensatory breathing technique and not hold her breathe. Patient Response: Cooperative  Transfers Overall transfer level: Needs assistance Equipment used: Rolling walker (2 wheels) Transfers: Sit to/from Stand, Bed to chair/wheelchair/BSC Sit to Stand: Min assist, +2 physical assistance, From elevated surface     Step pivot transfers: Min guard     General transfer comment: Required more assistance to power up into standing due to being anxious regarding pain. Once up was able to use RW and take steps towards recliner to sit.      Balance Overall balance assessment: Needs assistance Sitting-balance support: Bilateral upper extremity supported, Feet supported, No upper extremity supported Sitting balance-Leahy Scale: Fair Sitting balance - Comments: Initially required increased physical assist posteriorly to maintain balance due to posterior lean. With VC and guidance, pt was able to scoot towards EOB and bring bilateral hands onto lap to maintain balance without assist. Postural control: Posterior lean Standing balance support: Bilateral upper extremity supported, During functional activity, Reliant on assistive device for  balance Standing balance-Leahy  Scale: Poor Standing balance comment: Relied on RW to maintain balance        ADL either performed or assessed with clinical judgement   ADL Overall ADL's : Needs assistance/impaired     Grooming: Wash/dry hands;Wash/dry face;Oral care;Set up;Sitting   Upper Body Bathing: Set up;Sitting   Lower Body Bathing: Total assistance;Sit to/from stand   Upper Body Dressing : Set up;Sitting   Lower Body Dressing: Total assistance;Sit to/from stand   Toilet Transfer: Minimal assistance;+2 for physical assistance;Ambulation Toilet Transfer Details (indicate cue type and reason): Min Ax2 provided for power up due to pt's anxiety related to pain. Once up only 1 person assisst needed.        Vision Baseline Vision/History: 1 Wears glasses Ability to See in Adequate Light: 0 Adequate (with glasses on) Patient Visual Report: No change from baseline Vision Assessment?: No apparent visual deficits     Perception     Praxis      Pertinent Vitals/Pain Pain Assessment Pain Assessment: 0-10 Pain Score: 6  Pain Location: lower back Pain Descriptors / Indicators: Burning, Discomfort Pain Intervention(s): Limited activity within patient's tolerance, Monitored during session, Premedicated before session     Hand Dominance Right   Extremity/Trunk Assessment Upper Extremity Assessment Upper Extremity Assessment: Generalized weakness   Lower Extremity Assessment Lower Extremity Assessment: Defer to PT evaluation   Cervical / Trunk Assessment Cervical / Trunk Assessment: Back Surgery   Communication Communication Communication: No difficulties   Cognition Arousal/Alertness: Awake/alert Behavior During Therapy: WFL for tasks assessed/performed Overall Cognitive Status: Within Functional Limits for tasks assessed    General Comments: correct month and year stated. Did state it was May 35th although I'm pretty sure she meant 15th.     General Comments   2 JP drains present and intact. Small pea size abrasion noted on right shin when SDC was removed.            Home Living Family/patient expects to be discharged to:: Private residence Living Arrangements: Spouse/significant other Available Help at Discharge: Family;Available PRN/intermittently (pt is able to call for assistance from friends is needed.) Type of Home: House Home Access: Ramped entrance     Home Layout: One level     Bathroom Shower/Tub: Walk-in Pensions consultant: Handicapped height Bathroom Accessibility: Yes   Home Equipment: Grab bars - tub/shower;Shower seat;Grab bars - toilet;Cane - single point;Rolling Walker (2 wheels)   Additional Comments: Pt lives with spouse who has MS. Pt provided physical assistance/care for spouse 24/7. Pt voices that Daughter is able to come in and assist in the morning before work and around lunch and then hopefully 2 times in the evening.      Prior Functioning/Environment Prior Level of Function : Independent/Modified Independent;Driving      Mobility Comments: Independent ADLs Comments: Independent        OT Problem List: Decreased strength;Pain;Decreased activity tolerance;Impaired balance (sitting and/or standing);Decreased knowledge of use of DME or AE      OT Treatment/Interventions: Self-care/ADL training;Therapeutic exercise;Therapeutic activities;Neuromuscular education;Energy conservation;DME and/or AE instruction;Patient/family education;Balance training;Manual therapy;Modalities    OT Goals(Current goals can be found in the care plan section) Acute Rehab OT Goals Patient Stated Goal: to get into recliner OT Goal Formulation: Patient unable to participate in goal setting Time For Goal Achievement: 11/20/22 Potential to Achieve Goals: Good  OT Frequency: Min 2X/week    Co-evaluation PT/OT/SLP Co-Evaluation/Treatment: Yes Reason for Co-Treatment: To address functional/ADL transfers   OT goals  addressed during session: ADL's  and self-care;Strengthening/ROM;Proper use of Adaptive equipment and DME      AM-PAC OT "6 Clicks" Daily Activity     Outcome Measure Help from another person eating meals?: None Help from another person taking care of personal grooming?: A Little Help from another person toileting, which includes using toliet, bedpan, or urinal?: Total Help from another person bathing (including washing, rinsing, drying)?: Total Help from another person to put on and taking off regular upper body clothing?: A Little Help from another person to put on and taking off regular lower body clothing?: Total 6 Click Score: 13   End of Session Equipment Utilized During Treatment: Gait belt;Rolling walker (2 wheels) Nurse Communication: Mobility status  Activity Tolerance: Patient tolerated treatment well;Patient limited by pain Patient left: in chair;with call bell/phone within reach;with chair alarm set;with nursing/sitter in room  OT Visit Diagnosis: Unsteadiness on feet (R26.81);Muscle weakness (generalized) (M62.81)                Time: 1610-9604 OT Time Calculation (min): 40 min Charges:  OT General Charges $OT Visit: 1 Visit OT Evaluation $OT Eval High Complexity: 1 High OT Treatments $Self Care/Home Management : 23-37 mins  Limmie Patricia, OTR/L,CBIS  Supplemental OT - [facility] and [facility] Secure Chat Preferred    [name], Charisse March 11/06/2022, 11:57 AM

## 2022-11-06 NOTE — Plan of Care (Signed)

## 2022-11-06 NOTE — Progress Notes (Signed)
Orthopedic Surgery Post-operative Progress Note  Assessment: Patient is a 67 y.o. female who is currently admitted after undergoing L3-5 laminectomies and foraminotomies. Complicated by dural tear s/p repair   Plan: -Operative plans complete -Out of bed as tolerated, no brace needed -Drains to be maintained until output slows. Deep drain to gravity only -No bending/lifting/twisting greater than 10 pounds -PT evaluate and treat -Pain control -Diabetic diet -No chemoprophylaxis for dvt or antiplatelets for 72 hours after surgery -Disposition: remain floor status  Diabetes -sliding scale insulin -monitor blood glucose  Hyperlipidemia -continue home lipitor  Hypertension -continue home amlodipine, spironolactone, lisinopril-HCTZ  Neuropathy -continue home gabapentin  Acute anemia from surgical blood loss -Hgb was 11.6 pre-operatively and was 9.1 on 11/06/2022 -will monitor for signs/symptoms of anemia  ___________________________________________________________________________   Subjective: No acute events since seen this morning. Worked with PT. Had a lot of back pain but was able to walk around the room and get up to the chair. Has been up in the chair most of the afternoon. Has not had any headaches or nausea while up in the chair. No radiating leg pain. Denies paresthesias and numbness.   Objective:  General: no acute distress, appropriate affect Neurologic: alert, answering questions appropriately, following commands Respiratory: unlabored breathing on room air Skin: dressing clear/dry/intact, drains with minimal sanguinous output. No clear fluid seen in the deep drain  MSK (spine):  -Strength exam      Right  Left  EHL    4/5  4/5 TA    4/5  5/5 GSC    5/5  5/5 Knee extension  5/5  5/5 Hip flexion   5/5  5/5  -Sensory exam    Sensation intact to light touch in L3-S1 nerve distributions of bilateral lower extremities   Patient name: [name] Patient MRN: [id number] Date: 11/06/22

## 2022-11-06 NOTE — Discharge Summary (Signed)
Orthopedic Surgery Discharge Summary  Patient name: [name] Patient MRN: [id number] Admit today: 11/05/2022 Discharge date: 11/09/2022  Attending physician: Willia Craze, MD Final diagnosis: lumbar stenosis with neurogenic claudication Findings: L3/4 and L4/5 hypertrophic facet and ligamentum flavum   Hospital course: Patient is a 67 y.o. female who was admitted after undergoing L3-5 laminectomies and foraminotomies. The patient had a dural tear intra-operatively so the head of bed was kept flat after surgery until POD#1. Her head of bed was raised on POD#1 and she did not have any headaches so her bedrest restriction was discontinued. The patient had significant pain immediately after surgery requiring IV pain medication, but pain eventually was controlled with an oral multimodal regimen including oxycodone. Labs during the hospitalization revealed acute anemia from surgical blood loss but she did not require a transfusion and did not develop any signs/symptoms of anemia. The patient worked with physical therapy and was able to ambulate the halls without issue. She did not have any headaches with upright position. The patient was tolerating an oral diet without issue and was voiding spontaneously after surgery. The patient's vitals were stable on the day of discharge. The patient's drains were removed on the day of discharge. The patient was medically ready for discharge and was discharge to home on post-operative day 4.  Instructions:   Orthopedic Surgery Discharge Instructions  Patient name: [name] Procedure Performed: L3-5 laminectomies and foraminotomies Date of Surgery: 11/05/2022 Surgeon: Willia Craze, MD  Pre-operative Diagnosis: lumbar stenosis with neurogenic claudication Post-operative Diagnosis: same as above  Discharge Date: 11/09/2022 Discharged to: home Discharge Condition: stable  Activity: You should refrain from bending, lifting, or twisting with objects  greater than ten pounds until six weeks after surgery. You are encouraged to walk as much as desired. You can perform household activities such as cleaning dishes, doing laundry, vacuuming, etc. as long as the ten-pound restriction is followed. You do not need to wear a brace during the post-operative period.   Incision Care: Your incision site has a dressing over it. That dressing should remain in place and dry at all times for a total of one week after surgery. After one week, you can remove the dressing. Underneath the dressing, you will find pieces of tape. You should leave these pieces of tape in place. They will fall off with time. Do not pick, rub, or scrub at them. Do not put cream or lotion over the surgical area. After one week and once the dressing is off, it is okay to let soap and water run over your incision. Again, do not pick, scrub, or rub at the pieces of tape when bathing. Do not submerge (e.g., take a bath, swim, go in a hot tub, etc.) until six weeks after surgery. There may be some bloody drainage from the incision into the dressing after surgery. This is normal. You do not need to replace the dressing. Continue to leave it in place for the one week as instructed above. Should the dressing become saturated with blood or drainage, please call the office for further instructions.   Medications: You have been prescribed oxycodone. This is a narcotic pain medication and should only be taken as prescribed. You should not drink alcohol or operate heavy machinery (including driving) while taking this medication. The oxycodone can cause constipation as a side effect. For that reason, you have been prescribed senna and miralax. These are both laxatives. You do not need to take this medication if you develop diarrhea.  Should you remain constipated even while taking these medications, please increase the dose of miralax to twice daily. Tylenol has been prescribed to be taken every 8 hours, which will  give you additional pain relief. Robaxin is a muscle relaxer that has been prescribed to you for muscle spasm type pain. Take this medication as needed.   Do not take NSAIDs (ibuprofen, Aleve, Celebrex, naproxen, meloxicam, etc.) after surgery since these act like blood thinners and you are already on eliquis.   In order to set expectations for opioid prescriptions, you will only be prescribed opioids for a total of six weeks after surgery and, at two-weeks after surgery, your opioid prescription will start to tapered (decreased dosage and number of pills). If you have ongoing need for opioid medication six weeks after surgery, you will be referred to pain management. If you are already established with a provider that is giving you opioid medications, you should schedule an appointment with them for six weeks after surgery if you feel you are going to need another prescription. State law only allows for opioid prescriptions one week at a time. If you are running out of opioid medication near the end of the week, please call the office during business hours before running out so I can send you another prescription.   You may resume any home blood thinners (warfarin, lovenox, apixaban, plavix, xarelto, etc) 72 hours after your surgery. Take these medications as they were previously prescribed.  Driving: You should not drive while taking narcotic pain medications. You should start getting back to driving slowly and you may want to try driving in a parking lot before doing anything more.   Diet: You are safe to resume your regular diet after surgery.   Reasons to Call the Office After Surgery: You should feel free to call the office with any concerns or questions you have in the post-operative period, but you should definitely notify the office if you develop: -shortness of breath, chest pain, or trouble breathing -headaches, sensitivity to light, nausea -excessive bleeding, drainage, redness, or  swelling around the surgical site -fevers, chills, or pain that is getting worse with each passing day -persistent nausea or vomiting -new weakness in either leg -new or worsening numbness or tingling in either leg -numbness in the groin, bowel or bladder incontinence -other concerns about your surgery  Follow Up Appointments: You should have an office appointment scheduled for approximately two weeks after surgery. If you do not remember when this appointment is or do not already have it scheduled, please call the office to schedule.   Office Information:  -Willia Craze, MD -Phone number: [phone] -Address: [address]       [place], [place] [address]

## 2022-11-06 NOTE — Discharge Instructions (Addendum)
Orthopedic Surgery Discharge Instructions  Patient name: [name] Procedure Performed: L3-5 laminectomies and foraminotomies Date of Surgery: 11/05/2022 Surgeon: Willia Craze, MD  Pre-operative Diagnosis: lumbar stenosis with neurogenic claudication Post-operative Diagnosis: same as above  Discharge Date: 11/09/2022 Discharged to: home Discharge Condition: stable  Activity: You should refrain from bending, lifting, or twisting with objects greater than ten pounds until six weeks after surgery. You are encouraged to walk as much as desired. You can perform household activities such as cleaning dishes, doing laundry, vacuuming, etc. as long as the ten-pound restriction is followed. You do not need to wear a brace during the post-operative period.   Incision Care: Your incision site has a dressing over it. That dressing should remain in place and dry at all times for a total of one week after surgery. After one week, you can remove the dressing. Underneath the dressing, you will find pieces of tape. You should leave these pieces of tape in place. They will fall off with time. Do not pick, rub, or scrub at them. Do not put cream or lotion over the surgical area. After one week and once the dressing is off, it is okay to let soap and water run over your incision. Again, do not pick, scrub, or rub at the pieces of tape when bathing. Do not submerge (e.g., take a bath, swim, go in a hot tub, etc.) until six weeks after surgery. There may be some bloody drainage from the incision into the dressing after surgery. This is normal. You do not need to replace the dressing. Continue to leave it in place for the one week as instructed above. Should the dressing become saturated with blood or drainage, please call the office for further instructions.   Medications: You have been prescribed oxycodone. This is a narcotic pain medication and should only be taken as prescribed. You should not drink alcohol or  operate heavy machinery (including driving) while taking this medication. The oxycodone can cause constipation as a side effect. For that reason, you have been prescribed senna and miralax. These are both laxatives. You do not need to take this medication if you develop diarrhea. Should you remain constipated even while taking these medications, please increase the dose of miralax to twice daily. Tylenol has been prescribed to be taken every 8 hours, which will give you additional pain relief. Robaxin is a muscle relaxer that has been prescribed to you for muscle spasm type pain. Take this medication as needed.   Do not take NSAIDs (ibuprofen, Aleve, Celebrex, naproxen, meloxicam, etc.) after surgery since these act like blood thinners and you are already on eliquis.   In order to set expectations for opioid prescriptions, you will only be prescribed opioids for a total of six weeks after surgery and, at two-weeks after surgery, your opioid prescription will start to tapered (decreased dosage and number of pills). If you have ongoing need for opioid medication six weeks after surgery, you will be referred to pain management. If you are already established with a provider that is giving you opioid medications, you should schedule an appointment with them for six weeks after surgery if you feel you are going to need another prescription. State law only allows for opioid prescriptions one week at a time. If you are running out of opioid medication near the end of the week, please call the office during business hours before running out so I can send you another prescription.   You may resume any home  blood thinners (warfarin, lovenox, apixaban, plavix, xarelto, etc) 72 hours after your surgery. Take these medications as they were previously prescribed.  Driving: You should not drive while taking narcotic pain medications. You should start getting back to driving slowly and you may want to try driving in a  parking lot before doing anything more.   Diet: You are safe to resume your regular diet after surgery.   Reasons to Call the Office After Surgery: You should feel free to call the office with any concerns or questions you have in the post-operative period, but you should definitely notify the office if you develop: -shortness of breath, chest pain, or trouble breathing -headaches, sensitivity to light, nausea -excessive bleeding, drainage, redness, or swelling around the surgical site -fevers, chills, or pain that is getting worse with each passing day -persistent nausea or vomiting -new weakness in either leg -new or worsening numbness or tingling in either leg -numbness in the groin, bowel or bladder incontinence -other concerns about your surgery  Follow Up Appointments: You should have an office appointment scheduled for 11/20/2022 at 9:15am. Dr. Christell Constant will want to check on your progress and your incision at that visit.   Office Information:  -Willia Craze, MD -Phone number: [phone] -Address: [address]       [place], [place] [address]

## 2022-11-06 NOTE — Evaluation (Addendum)
Physical Therapy Evaluation Patient Details Name: [name] MRN: [id number] DOB: [birth year] Today's Date: 11/06/2022  History of Present Illness  67 y.o. female who is currently admitted after undergoing L3-5 laminectomies and foraminotomies. Complicated by dural tear s/p repair. PMH:  T2DM, HTN, OSA, NASH, persis A-fib, symptomatic bradycardia status post PPM   Clinical Impression  Patient is s/p above surgery resulting in the deficits listed below (see PT Problem List). PT pt lived at home with her husband. 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 Pt was active, independent, and driving. On eval, she required +2 mod assist bed mobility, +2 mod assist sit to stand, and min assist +2 safety amb 7' with RW. Pt reports 6/10 back pain. No headache, nausea, or light sensitivity. Patient will benefit from acute skilled PT to increase their independence and safety with mobility (while adhering to their precautions) to allow discharge . Recommending further therapy < 3 hrs/day upon d/c from acute care.        Recommendations for follow up therapy are one component of a multi-disciplinary discharge planning process, led by the attending physician.  Recommendations may be updated based on patient status, additional functional criteria and insurance authorization.  Follow Up Recommendations Can patient physically be transported by private vehicle: Yes     Assistance Recommended at Discharge Frequent or constant Supervision/Assistance  Patient can return home with the following  A lot of help with walking and/or transfers;A lot of help with bathing/dressing/bathroom;Assistance with cooking/housework;Assist for transportation;Help with stairs or ramp for entrance    Equipment Recommendations  (Pt reports having RW.)  Recommendations for Other Services       Functional Status Assessment Patient has had a recent decline in their functional status and demonstrates the ability to make significant  improvements in function in a reasonable and predictable amount of time.     Precautions / Restrictions Precautions Precautions: Back;Other (comment) Precaution Booklet Issued: Yes (comment) Precaution Comments: educated on 3/3 back precautions, bilat hemovac drains, foley Restrictions Weight Bearing Restrictions: No Other Position/Activity Restrictions: no brace per chart      Mobility  Bed Mobility Overal bed mobility: Needs Assistance Bed Mobility: Rolling, Sidelying to Sit Rolling: Mod assist, +2 for physical assistance Sidelying to sit: Mod assist, +2 for physical assistance, HOB elevated       General bed mobility comments: +rail, increased time, cues for sequencing/logroll, assist with BLE and trunk    Transfers Overall transfer level: Needs assistance Equipment used: Rolling walker (2 wheels) Transfers: Sit to/from Stand Sit to Stand: Min assist, +2 physical assistance, From elevated surface           General transfer comment: cues for hand placement, increased time, assist to power up and stabilize balance    Ambulation/Gait Ambulation/Gait assistance: +2 safety/equipment, Min assist Gait Distance (Feet): 7 Feet Assistive device: Rolling walker (2 wheels) Gait Pattern/deviations: Step-through pattern, Decreased stride length Gait velocity: decreased     General Gait Details: cues for sequencing and RW management  Stairs            Wheelchair Mobility    Modified Rankin (Stroke Patients Only)       Balance Overall balance assessment: Needs assistance Sitting-balance support: Bilateral upper extremity supported, Feet supported, No upper extremity supported Sitting balance-Leahy Scale: Fair     Standing balance support: Bilateral upper extremity supported, During functional activity, Reliant on assistive device for balance Standing balance-Leahy Scale: Poor  Pertinent Vitals/Pain Pain  Assessment Pain Assessment: 0-10 Pain Score: 6  Pain Location: back Pain Descriptors / Indicators: Burning, Discomfort Pain Intervention(s): Limited activity within patient's tolerance, Monitored during session, Repositioned, Premedicated before session    Home Living Family/patient expects to be discharged to:: Private residence Living Arrangements: Spouse/significant other Available Help at Discharge: Family;Available PRN/intermittently Type of Home: House Home Access: Ramped entrance       Home Layout: One level Home Equipment: Grab bars - tub/shower;Shower seat;Grab bars - toilet;Cane - single point;Rolling Walker (2 wheels) Additional Comments: Pt lives with spouse who has MS. Pt provided physical assistance/care for spouse 24/7. Pt voices that Daughter is able to come in and assist in the morning before work and around lunch and then hopefully 2 times in the evening.    Prior Function Prior Level of Function : Independent/Modified Independent;Driving             Mobility Comments: Independent ADLs Comments: Independent     Hand Dominance   Dominant Hand: Right    Extremity/Trunk Assessment   Upper Extremity Assessment Upper Extremity Assessment: Defer to OT evaluation    Lower Extremity Assessment Lower Extremity Assessment: Generalized weakness    Cervical / Trunk Assessment Cervical / Trunk Assessment: Back Surgery  Communication   Communication: No difficulties  Cognition Arousal/Alertness: Awake/alert Behavior During Therapy: WFL for tasks assessed/performed Overall Cognitive Status: Within Functional Limits for tasks assessed                                 General Comments: verbose, easily distracted        General Comments General comments (skin integrity, edema, etc.): 2 JP drains present and intact. Small pea size abrasion noted on right shin when SDC was removed.    Exercises     Assessment/Plan    PT Assessment Patient  needs continued PT services  PT Problem List Decreased strength;Decreased balance;Decreased knowledge of precautions;Pain;Decreased mobility;Decreased knowledge of use of DME;Decreased activity tolerance       PT Treatment Interventions DME instruction;Functional mobility training;Balance training;Patient/family education;Gait training;Therapeutic activities    PT Goals (Current goals can be found in the Care Plan section)  Acute Rehab PT Goals Patient Stated Goal: home PT Goal Formulation: With patient Time For Goal Achievement: 11/20/22 Potential to Achieve Goals: Good    Frequency Min 5X/week     Co-evaluation PT/OT/SLP Co-Evaluation/Treatment: Yes Reason for Co-Treatment: To address functional/ADL transfers;Other (comment) (pain) PT goals addressed during session: Mobility/safety with mobility;Balance;Proper use of DME OT goals addressed during session: ADL's and self-care;Strengthening/ROM;Proper use of Adaptive equipment and DME       AM-PAC PT "6 Clicks" Mobility  Outcome Measure Help needed turning from your back to your side while in a flat bed without using bedrails?: A Lot Help needed moving from lying on your back to sitting on the side of a flat bed without using bedrails?: A Lot Help needed moving to and from a bed to a chair (including a wheelchair)?: A Lot Help needed standing up from a chair using your arms (e.g., wheelchair or bedside chair)?: A Lot Help needed to walk in hospital room?: A Lot Help needed climbing 3-5 steps with a railing? : Total 6 Click Score: 11    End of Session Equipment Utilized During Treatment: Gait belt Activity Tolerance: Patient tolerated treatment well Patient left: in chair;with call bell/phone within reach;with chair alarm set Nurse Communication: Mobility status  PT Visit Diagnosis: Other abnormalities of gait and mobility (R26.89);Pain    Time: 1610-9604 PT Time Calculation (min) (ACUTE ONLY): 29 min   Charges:   PT  Evaluation $PT Eval Moderate Complexity: 1 Mod          Ferd Glassing., PT  Office # [phone]   Ilda Foil 11/06/2022, 12:15 PM

## 2022-11-06 NOTE — Anesthesia Postprocedure Evaluation (Signed)
Anesthesia Post Note  Patient: JACLYN HAYRE  Procedure(s) Performed: L3-4, L4-5 DECOMPRESSIVE LUMBAR LAMINECTOMY LEVEL 2     Patient location during evaluation: PACU Anesthesia Type: General Level of consciousness: awake and alert Pain management: pain level controlled Vital Signs Assessment: post-procedure vital signs reviewed and stable Respiratory status: spontaneous breathing, nonlabored ventilation, respiratory function stable and patient connected to nasal cannula oxygen Cardiovascular status: blood pressure returned to baseline and stable Postop Assessment: no apparent nausea or vomiting Anesthetic complications: no   There were no known notable events for this encounter.  Last Vitals:  Vitals:   11/06/22 0402 11/06/22 0740  BP: (!) 151/61 (!) 169/95  Pulse: 79 75  Resp: 17 17  Temp: 37.2 C 36.5 C  SpO2: 93% 100%    Last Pain:  Vitals:   11/06/22 0740  TempSrc: Oral  PainSc:                  Nelle Don Mccabe Gloria

## 2022-11-07 LAB — GLUCOSE, CAPILLARY
Glucose-Capillary: 101 mg/dL — ABNORMAL HIGH (ref 70–99)
Glucose-Capillary: 101 mg/dL — ABNORMAL HIGH (ref 70–99)
Glucose-Capillary: 80 mg/dL (ref 70–99)
Glucose-Capillary: 116 mg/dL — ABNORMAL HIGH (ref 70–99)

## 2022-11-07 NOTE — TOC Initial Note (Signed)
Transition of Care [facility]) - Initial/Assessment Note    Patient Details  Name: [name] MRN: [id number] Date of Birth: 07/06/1955  Transition of Care [facility]) CM/SW Contact:    Lorri Frederick, LCSW Phone Number: 11/07/2022, 9:57 AM  Clinical Narrative:     CSW met with pt regarding DC recommendation for SNF.  Pt does not want to pursue SNF.  Pt from home with husband, who is disabled.  Pt daughter [name] and son in law are assisting with his care.  Pt would like to DC home with HH instead.  Husband, Sabria Seekings, currently active with [facility], and pt would like for them to provide her [facility] services as well.  Permission given to speak with daughter and husband.  Pt reports PCP in place.  Currently has DME at home: walker, cane, shower chair.  CSW spoke with daughter [name], who is in agreement with this plan as well.  Daughter able to provide any needed support for pt at home.              Expected Discharge Plan: Home w Home Health Services Barriers to Discharge: Continued Medical Work up   Patient Goals and CMS Choice Patient states their goals for this hospitalization and ongoing recovery are:: wants back to be better than it was   Choice offered to / list presented to : Patient (wants [facility])      Expected Discharge Plan and Services In-house Referral: Clinical Social Work   Post Acute Care Choice: Home Health Living arrangements for the past 2 months: Single Family Home                                      Prior Living Arrangements/Services Living arrangements for the past 2 months: Single Family Home Lives with:: Spouse Patient language and need for interpreter reviewed:: Yes Do you feel safe going back to the place where you live?: Yes      Need for Family Participation in Patient Care: Yes (Comment) Care giver support system in place?: Yes (comment) Current home services: Other (comment) (none for pt, husband receives HH throught [facility]) Criminal  Activity/Legal Involvement Pertinent to Current Situation/Hospitalization: No - Comment as needed  Activities of Daily Living      Permission Sought/Granted Permission sought to share information with : Family Supports Permission granted to share information with : Yes, Verbal Permission Granted  Share Information with NAME: daughter [name], husband Tammy Sours  Permission granted to share info w AGENCY: HH        Emotional Assessment Appearance:: Appears stated age Attitude/Demeanor/Rapport: Engaged Affect (typically observed): Appropriate, Pleasant Orientation: : Oriented to Self, Oriented to Place, Oriented to  Time, Oriented to Situation      Admission diagnosis:  Lumbar stenosis with neurogenic claudication [M48.062] Patient Active Problem List   Diagnosis Date Noted   Lumbar stenosis with neurogenic claudication 11/05/2022   Callus of foot 06/28/2022   Tremor 11/05/2021   Mood changes 07/17/2021   Neck muscle spasm 03/17/2021   Type 2 DM with diabetic neuropathy affecting both sides of body (HCC) 01/09/2021   Right leg swelling 12/27/2020   Parathyroid adenoma 11/23/2020   Osteoporosis screening 11/16/2020   Memory loss 08/07/2020   Serum calcium elevated 04/12/2020   Hallux rigidus of left foot 04/12/2020   Hyperparathyroidism (HCC) 04/11/2020   Presbycusis of both ears 04/11/2020   No-show for appointment 03/08/2020  Pacemaker    Hypothyroidism 07/19/2019   Hypertension associated with diabetes (HCC) 07/11/2018   Hyperlipidemia associated with type 2 diabetes mellitus (HCC) 07/11/2018   Short bowel syndrome 07/11/2018   Status post left knee replacement 07/11/2018   Major depression in remission (HCC) 07/07/2018   Coronary artery disease involving native coronary artery of native heart without angina pectoris 07/07/2018   Paroxysmal atrial fibrillation (HCC) 05/11/2018   Age-related cognitive decline 04/30/2018   Gastroesophageal reflux disease without esophagitis  04/15/2017   B12 deficiency 12/13/2016   Tubular adenoma of colon 03/09/2016   Vitamin D deficiency 03/09/2016   S/P bariatric surgery-duodenal switch with sleeve gastrectomy 12/04/2013   Morbid obesity (HCC) 08/26/2013   Iron deficiency anemia 06/19/2012   Low back pain 03/05/2012   PSORIASIS 05/28/2010   Peripheral neuropathy 01/12/2009   Type 2 diabetes, controlled, with neuropathy (HCC) 02/18/2008   Obesity with sleep apnea 06/12/2007   PCP:  Reece Leader, DO Pharmacy:   [facility] - [address] [place], [place] - [address] [place] [address] Phone: [phone] Fax: [phone]  [id number] - [place], [place] - [name] [place] [address] Phone: [phone] Fax: [phone]     Social Determinants of Health (SDOH) Social History: SDOH Screenings   Food Insecurity: No Food Insecurity (06/12/2021)  Housing: Low Risk  (06/12/2021)  Transportation Needs: No Transportation Needs (06/12/2021)  Alcohol Screen: Low Risk  (06/12/2021)  Depression (PHQ2-9): Low Risk  (06/28/2022)  Financial Resource Strain: Low Risk  (06/12/2021)  Physical Activity: Inactive (06/12/2021)  Social Connections: Moderately Isolated (06/12/2021)  Stress: Stress Concern Present (06/12/2021)  Tobacco Use: Medium Risk (11/06/2022)   SDOH Interventions:     Readmission Risk Interventions     No data to display

## 2022-11-07 NOTE — Progress Notes (Signed)
Physical Therapy Treatment Patient Details Name: [name] MRN: [id number] DOB: 1956-05-24 Today's Date: 11/07/2022   History of Present Illness 67 y.o. female who is currently admitted after undergoing L3-5 laminectomies and foraminotomies. Complicated by dural tear s/p repair. PMH:  T2DM, HTN, OSA, NASH, persis A-fib, symptomatic bradycardia status post PPM    PT Comments    Pt received in supine and agreeable to session. Pt requiring cues to recall back precautions. Pt demonstrating limited recall of logroll technique from previous session, requiring min A to maintain back precautions. Pt motivated to participate in gait trial and able to tolerate significantly increased distance this session progressing to supervision. Pt reporting going to the bathroom this morning independently. Pt able to improve standing technique with cues for anterior weight shift. Pt would benefit from additional session to practice bed mobility before discharge. Pt reporting daughter will assist with pt's husband so she will not need to physically assist him. After discussion with supervising PT Jerilee Hoh., discharge recommendations have been updated to HHPT based on pt's improved mobility. Acutely, pt continues to benefit from PT services to progress toward functional mobility goals.    Recommendations for follow up therapy are one component of a multi-disciplinary discharge planning process, led by the attending physician.  Recommendations may be updated based on patient status, additional functional criteria and insurance authorization.     Assistance Recommended at Discharge Frequent or constant Supervision/Assistance  Patient can return home with the following A lot of help with walking and/or transfers;A lot of help with bathing/dressing/bathroom;Assistance with cooking/housework;Assist for transportation;Help with stairs or ramp for entrance   Equipment Recommendations       Recommendations for Other Services        Precautions / Restrictions Precautions Precautions: Back;Other (comment) Precaution Booklet Issued: Yes (comment) Precaution Comments: educated on 3/3 back precautions, bilat hemovac drains Restrictions Weight Bearing Restrictions: No Other Position/Activity Restrictions: no brace per chart     Mobility  Bed Mobility Overal bed mobility: Needs Assistance Bed Mobility: Rolling, Sidelying to Sit Rolling: Min assist Sidelying to sit: Min assist, HOB elevated       General bed mobility comments: Cues for logroll technique and min A for rolling and BLE management to maintain back precautions    Transfers Overall transfer level: Needs assistance Equipment used: Rolling walker (2 wheels) Transfers: Sit to/from Stand Sit to Stand: Min guard           General transfer comment: from EOB x1 and recliner x4 with cues for hand placement and anterior weight shift    Ambulation/Gait Ambulation/Gait assistance: Min guard, Supervision Gait Distance (Feet): 550 Feet Assistive device: Rolling walker (2 wheels) Gait Pattern/deviations: Step-through pattern, Decreased stride length, Trunk flexed       General Gait Details: Pt demonstrating steady step-through pattern with cues for upright posture. Pt progressing from min guard to supervision for safety       Balance Overall balance assessment: Needs assistance Sitting-balance support: Feet supported, No upper extremity supported Sitting balance-Leahy Scale: Good Sitting balance - Comments: sitting EOB   Standing balance support: Bilateral upper extremity supported, During functional activity, Reliant on assistive device for balance Standing balance-Leahy Scale: Poor Standing balance comment: with RW support                            Cognition Arousal/Alertness: Awake/alert Behavior During Therapy: WFL for tasks assessed/performed Overall Cognitive Status: Within Functional Limits for tasks assessed  Exercises      General Comments        Pertinent Vitals/Pain Pain Assessment Pain Assessment: 0-10 Pain Score: 4  Pain Location: back Pain Descriptors / Indicators: Discomfort, Guarding, Grimacing Pain Intervention(s): Monitored during session, Repositioned     PT Goals (current goals can now be found in the care plan section) Acute Rehab PT Goals Patient Stated Goal: home PT Goal Formulation: With patient Time For Goal Achievement: 11/20/22 Potential to Achieve Goals: Good Progress towards PT goals: Progressing toward goals    Frequency    Min 5X/week      PT Plan Discharge plan needs to be updated       AM-PAC PT "6 Clicks" Mobility   Outcome Measure  Help needed turning from your back to your side while in a flat bed without using bedrails?: A Little Help needed moving from lying on your back to sitting on the side of a flat bed without using bedrails?: A Little Help needed moving to and from a bed to a chair (including a wheelchair)?: A Little Help needed standing up from a chair using your arms (e.g., wheelchair or bedside chair)?: A Little Help needed to walk in hospital room?: A Little Help needed climbing 3-5 steps with a railing? : A Lot 6 Click Score: 17    End of Session Equipment Utilized During Treatment: Gait belt Activity Tolerance: Patient tolerated treatment well Patient left: in chair;with call bell/phone within reach;with chair alarm set Nurse Communication: Mobility status PT Visit Diagnosis: Other abnormalities of gait and mobility (R26.89);Pain     Time: 1331-1410 PT Time Calculation (min) (ACUTE ONLY): 39 min  Charges:  $Gait Training: 23-37 mins $Therapeutic Activity: 8-22 mins                     Johny Shock, PTA Acute Rehabilitation Services Secure Chat Preferred  [phone]    Johny Shock 11/07/2022, 2:25 PM

## 2022-11-07 NOTE — TOC Progression Note (Addendum)
Transition of Care [facility]) - Progression Note    Patient Details  Name: [name] MRN: [id number] Date of Birth: 1956/01/04  Transition [facility] Care [facility]) CM/SW Contact  Epifanio Lesches, RN Phone Number: 11/07/2022, 10:31 AM  Clinical Narrative:    Pt declined SNF placement. States will transition to home with family. States daughter will assist with care once discharge. Agreeable to home health services. PTA husband active with [facility] and  she would like to use as well for [facility] services. Referral made with [facility] and accepted. Pt states without DME needs, has equipment @ home. Daughter to provide transportation to home when d/c. Pt  without RX med concerns.   TOC team will continue to monitor and assist with needs ....   Expected Discharge Plan: Home w Home Health Services Barriers to Discharge: Continued Medical Work up  Expected Discharge Plan and Services In-house Referral: Clinical Social Work   Post Acute Care Choice: Home Health Living arrangements for the past 2 months: Single Family Home                           HH Arranged: PT, OT   Date HH Agency Contacted: 11/07/22 Time HH Agency Contacted: 1031 Representative spoke with at [facility] Agency: [facility]   Social Determinants of Health (SDOH) Interventions SDOH Screenings   Food Insecurity: No Food Insecurity (06/12/2021)  Housing: Low Risk  (06/12/2021)  Transportation Needs: No Transportation Needs (06/12/2021)  Alcohol Screen: Low Risk  (06/12/2021)  Depression (PHQ2-9): Low Risk  (06/28/2022)  Financial Resource Strain: Low Risk  (06/12/2021)  Physical Activity: Inactive (06/12/2021)  Social Connections: Moderately Isolated (06/12/2021)  Stress: Stress Concern Present (06/12/2021)  Tobacco Use: Medium Risk (11/06/2022)    Readmission Risk Interventions     No data to display

## 2022-11-07 NOTE — Plan of Care (Signed)

## 2022-11-08 LAB — GLUCOSE, CAPILLARY
Glucose-Capillary: 135 mg/dL — ABNORMAL HIGH (ref 70–99)
Glucose-Capillary: 82 mg/dL (ref 70–99)
Glucose-Capillary: 100 mg/dL — ABNORMAL HIGH (ref 70–99)
Glucose-Capillary: 100 mg/dL — ABNORMAL HIGH (ref 70–99)
Glucose-Capillary: 73 mg/dL (ref 70–99)

## 2022-11-08 MED ORDER — ACETAMINOPHEN 500 MG PO TABS: 1000.0000 mg | ORAL_TABLET | Freq: Three times a day (TID) | ORAL | 0 refills | Status: AC

## 2022-11-08 MED ORDER — SENNA 8.6 MG PO TABS: 1.0000 | ORAL_TABLET | Freq: Two times a day (BID) | ORAL | 0 refills | Status: DC

## 2022-11-08 MED ORDER — METHOCARBAMOL 750 MG PO TABS: 750.0000 mg | ORAL_TABLET | Freq: Four times a day (QID) | ORAL | 0 refills | Status: AC

## 2022-11-08 MED ORDER — MAGNESIUM CITRATE PO SOLN
1.0000 | Freq: Every day | ORAL | Status: DC
  Administered 2022-11-08 – 2022-11-09 (×2): 1 via ORAL
  Filled 2022-11-08: qty 296

## 2022-11-08 MED ORDER — POLYETHYLENE GLYCOL 3350 17 G PO PACK: 17.0000 g | PACK | Freq: Every day | ORAL | 0 refills | Status: AC

## 2022-11-08 MED ORDER — OXYCODONE HCL 5 MG PO TABS: 5.0000 mg | ORAL_TABLET | ORAL | 0 refills | Status: AC | PRN

## 2022-11-08 NOTE — Progress Notes (Signed)
Orthopedic Surgery Post-operative Progress Note  Assessment: Patient is a 67 y.o. female who is currently admitted after undergoing L3-5 laminectomies and foraminotomies. Complicated by dural tear s/p repair   Plan: -Operative plans complete -Out of bed as tolerated, no brace needed -Drains to be maintained until output slows. Deep drain to gravity only -No bending/lifting/twisting greater than 10 pounds -PT evaluate and treat -Pain control -Diabetic diet -No chemoprophylaxis for dvt or antiplatelets for 72 hours after surgery -Disposition: remain floor status  Diabetes -sliding scale insulin -monitor blood glucose  Hyperlipidemia -continue home lipitor  Hypertension -continue home amlodipine, spironolactone, lisinopril-HCTZ  Neuropathy -continue home gabapentin  Acute anemia from surgical blood loss -Hgb was 11.6 pre-operatively and was 9.1 on 11/06/2022 -will monitor for signs/symptoms of anemia  ___________________________________________________________________________   Subjective: No acute events overnight. Pain well controlled. Back pain improving. No pain radiating into her legs. Wants to work with PT again this afternoon. Denies paresthesias and numbness. No headaches.   Objective:  General: no acute distress, appropriate affect Neurologic: alert, answering questions appropriately, following commands Respiratory: unlabored breathing on room air Skin: dressing clear/dry/intact, drains with minimal sanguinous output. No clear fluid seen in the deep drain  MSK (spine):  -Strength exam      Right  Left  EHL    4/5  4/5 TA    4/5  5/5 GSC    5/5  5/5 Knee extension  5/5  5/5 Hip flexion   5/5  5/5  -Sensory exam    Sensation intact to light touch in L3-S1 nerve distributions of bilateral lower extremities   Patient name: [name] Patient MRN: [id number] Date: 11/08/22

## 2022-11-08 NOTE — Progress Notes (Signed)
Occupational Therapy Treatment Patient Details Name: [name] MRN: [id number] DOB: December 04, 1955 Today's Date: 11/08/2022   History of present illness 67 y.o. female who is currently admitted after undergoing L3-5 laminectomies and foraminotomies. Complicated by dural tear s/p repair. PMH:  T2DM, HTN, OSA, NASH, persis A-fib, symptomatic bradycardia status post PPM   OT comments  Pt able to ambulate with RW to bathroom routinely without assist. Educated pt, at length, in back precautions, compensatory strategies for ADLs and IADLs to avoid. Reinforced log roll technique and positioning in bed with knees supported by pillow in supine and between knees in sidelying. Instructed pt to continue use of RW as she has a hx of falls. Pt verbalized and/or demonstrated understanding. She has a supportive daughter who will help as needed at home. Updated d/c recommendation to HHOT.    Recommendations for follow up therapy are one component of a multi-disciplinary discharge planning process, led by the attending physician.  Recommendations may be updated based on patient status, additional functional criteria and insurance authorization.    Assistance Recommended at Discharge Frequent or constant Supervision/Assistance  Patient can return home with the following  A little help with walking and/or transfers;A little help with bathing/dressing/bathroom;Help with stairs or ramp for entrance;Assist for transportation;Assistance with cooking/housework   Equipment Recommendations  BSC/3in1    Recommendations for Other Services      Precautions / Restrictions Precautions Precautions: Back;Fall Precaution Booklet Issued: Yes (comment) Precaution Comments: reviewed back precautions related to ADLs and IADLs Restrictions Weight Bearing Restrictions: No       Mobility Bed Mobility Overal bed mobility: Modified Independent             General bed mobility comments: used rail, + log roll technique     Transfers Overall transfer level: Modified independent Equipment used: Rolling walker (2 wheels)               General transfer comment: pt has been routinely taking herself to the bathroom, reports she nearly does not need the RW, but has a hx of many falls, educated to continue use of RW     Balance Overall balance assessment: Needs assistance   Sitting balance-Leahy Scale: Good       Standing balance-Leahy Scale: Fair                             ADL either performed or assessed with clinical judgement   ADL Overall ADL's : Needs assistance/impaired     Grooming: Oral care;Standing;Wash/dry hands Grooming Details (indicate cue type and reason): educated in 2 cup method for oral care and use of washcloth on face to avoid bending over sink Upper Body Bathing: Minimal assistance;Sitting Upper Body Bathing Details (indicate cue type and reason): recommended long handle bath sponge for back Lower Body Bathing: Supervison/ safety;Sit to/from stand Lower Body Bathing Details (indicate cue type and reason): plans to sit, can perform figure 4 Upper Body Dressing : Set up;Sitting   Lower Body Dressing: Supervision/safety;Sit to/from stand Lower Body Dressing Details (indicate cue type and reason): can don socks with figure 4 method Toilet Transfer: Ambulation;Rolling walker (2 wheels);Modified Independent   Toileting- Clothing Manipulation and Hygiene: Modified independent;Sitting/lateral lean Toileting - Clothing Manipulation Details (indicate cue type and reason): educated to avoid twisting with pericare     Functional mobility during ADLs: Modified independent;Rolling walker (2 wheels) General ADL Comments: Educated in IADLs to avoid to adhere to back precautions. Will  have daughter available to help.    Extremity/Trunk Assessment              Vision       Perception     Praxis      Cognition Arousal/Alertness: Awake/alert Behavior During  Therapy: WFL for tasks assessed/performed Overall Cognitive Status: Within Functional Limits for tasks assessed                                 General Comments: responds well to multimodal education        Exercises      Shoulder Instructions       General Comments      Pertinent Vitals/ Pain       Pain Assessment Pain Assessment: Faces Faces Pain Scale: Hurts a little bit Pain Location: back Pain Descriptors / Indicators: Discomfort, Guarding, Grimacing Pain Intervention(s): Monitored during session, Repositioned  Home Living                                          Prior Functioning/Environment              Frequency  Min 2X/week        Progress Toward Goals  OT Goals(current goals can now be found in the care plan section)  Progress towards OT goals: Progressing toward goals  Acute Rehab OT Goals OT Goal Formulation: With patient Time For Goal Achievement: 11/20/22 Potential to Achieve Goals: Good  Plan Discharge plan needs to be updated    Co-evaluation                 AM-PAC OT "6 Clicks" Daily Activity     Outcome Measure   Help from another person eating meals?: None Help from another person taking care of personal grooming?: A Little Help from another person toileting, which includes using toliet, bedpan, or urinal?: None Help from another person bathing (including washing, rinsing, drying)?: A Little Help from another person to put on and taking off regular upper body clothing?: None Help from another person to put on and taking off regular lower body clothing?: A Little 6 Click Score: 21    End of Session Equipment Utilized During Treatment: Rolling walker (2 wheels)  OT Visit Diagnosis: Unsteadiness on feet (R26.81);Muscle weakness (generalized) (M62.81)   Activity Tolerance Patient tolerated treatment well   Patient Left in bed;with call bell/phone within reach   Nurse Communication           Time: 4098-1191 OT Time Calculation (min): 39 min  Charges: OT General Charges $OT Visit: 1 Visit OT Treatments $Self Care/Home Management : 23-37 mins  Berna Spare, OTR/L Acute Rehabilitation Services Office: [phone]   Evern Bio 11/08/2022, 12:05 PM

## 2022-11-08 NOTE — Progress Notes (Signed)
Orthopedic Surgery Post-operative Progress Note  Assessment: Patient is a 67 y.o. female who is currently admitted after undergoing L3-5 laminectomies and foraminotomies. Complicated by dural tear s/p repair   Plan: -Operative plans complete -Out of bed as tolerated, no brace needed -Drains removed this morning -No bending/lifting/twisting greater than 10 pounds -PT evaluate and treat -Pain control -Diabetic diet -No chemoprophylaxis for dvt or antiplatelets for 72 hours after surgery -Anticipate discharge to home tomorrow  Diabetes -sliding scale insulin -monitor blood glucose  Hyperlipidemia -continue home lipitor  Hypertension -continue home amlodipine, spironolactone, lisinopril-HCTZ  Neuropathy -continue home gabapentin  Acute anemia from surgical blood loss -Hgb was 11.6 pre-operatively and was 9.1 on 11/06/2022 -will monitor for signs/symptoms of anemia  Constipation -Patient has not had a bowel movement surgery -She has been refusing the miralax -Explained the importance of a stool softener when taking opioids -Started mag citrate this morning, continue docusate  ___________________________________________________________________________   Subjective: No acute events overnight. Pain adequately controlled. Was able to walk the halls yesterday with PT. Did not have any leg pain. Has not had any headaches post-operatively. Denies paresthesias and numbness.   Objective:  General: no acute distress, appropriate affect Neurologic: alert, answering questions appropriately, following commands Respiratory: unlabored breathing on room air Skin: dressing clear/dry/intact, drains with minimal serosanguinous output. No clear fluid seen in the deep drain  MSK (spine):  -Strength exam      Right  Left  EHL    4/5  4/5 TA    4/5  5/5 GSC    5/5  5/5 Knee extension  5/5  5/5 Hip flexion   5/5  5/5  -Sensory exam    Sensation intact to light touch in L3-S1 nerve  distributions of bilateral lower extremities   Patient name: [name] Patient MRN: [id number] Date: 11/08/22

## 2022-11-08 NOTE — Plan of Care (Signed)

## 2022-11-08 NOTE — Progress Notes (Signed)
Physical Therapy Treatment Patient Details Name: [name] MRN: [id number] DOB: [birth year] Today's Date: 11/08/2022   History of Present Illness 67 y.o. female who is currently admitted after undergoing L3-5 laminectomies and foraminotomies. Complicated by dural tear s/p repair. PMH:  T2DM, HTN, OSA, NASH, persis A-fib, symptomatic bradycardia status post PPM    PT Comments    Pt received in supine and agreeable to session. Pt with improved recall of back precautions, however continuing to require cues. Pt performing multiple trials of bed mobility with elevated flat bed and no handrails to simulate home environment. Pt required cues and intermittent min A for log roll technique. Pt demonstrating improved technique during last trial. Pt demonstrates improved activity tolerance and no s/sx of fatigue after session. Anticipate pt will be able to manage mobility needs at home once medically ready for discharge.   Recommendations for follow up therapy are one component of a multi-disciplinary discharge planning process, led by the attending physician.  Recommendations may be updated based on patient status, additional functional criteria and insurance authorization.     Assistance Recommended at Discharge Frequent or constant Supervision/Assistance  Patient can return home with the following A lot of help with walking and/or transfers;A lot of help with bathing/dressing/bathroom;Assistance with cooking/housework;Assist for transportation;Help with stairs or ramp for entrance   Equipment Recommendations       Recommendations for Other Services       Precautions / Restrictions Precautions Precautions: Back;Fall Precaution Booklet Issued: Yes (comment) Precaution Comments: reviewed back precautions related to ADLs and IADLs Restrictions Weight Bearing Restrictions: No Other Position/Activity Restrictions: no brace per chart     Mobility  Bed Mobility Overal bed mobility: Needs  Assistance Bed Mobility: Sidelying to Sit, Rolling, Sit to Sidelying Rolling: Min guard Sidelying to sit: Min guard     Sit to sidelying: Min guard General bed mobility comments: Pt requiring dense cues for logroll technique. Pt performing multiple trials.    Transfers Overall transfer level: Modified independent Equipment used: Rolling walker (2 wheels) Transfers: Sit to/from Stand Sit to Stand: Supervision           General transfer comment: From EOB with supervision for safety.    Ambulation/Gait Ambulation/Gait assistance: Supervision Gait Distance (Feet): 420 Feet Assistive device: Rolling walker (2 wheels) Gait Pattern/deviations: Step-through pattern, Decreased stride length, Trunk flexed Gait velocity: decreased     General Gait Details: Pt demonstrating steady step-through pattern with cues for upright posture and supervision for safety.      Balance Overall balance assessment: Needs assistance Sitting-balance support: Feet supported, No upper extremity supported Sitting balance-Leahy Scale: Good Sitting balance - Comments: sitting EOB   Standing balance support: Bilateral upper extremity supported, During functional activity, Reliant on assistive device for balance Standing balance-Leahy Scale: Fair Standing balance comment: with RW support                            Cognition Arousal/Alertness: Awake/alert Behavior During Therapy: WFL for tasks assessed/performed Overall Cognitive Status: Within Functional Limits for tasks assessed                                          Exercises      General Comments        Pertinent Vitals/Pain Pain Assessment Pain Assessment: Faces Faces Pain Scale: Hurts a little bit Pain  Location: back Pain Descriptors / Indicators: Discomfort, Guarding, Grimacing Pain Intervention(s): Monitored during session, Repositioned     PT Goals (current goals can now be found in the care plan  section) Acute Rehab PT Goals Patient Stated Goal: home PT Goal Formulation: With patient Time For Goal Achievement: 11/20/22 Potential to Achieve Goals: Good Progress towards PT goals: Progressing toward goals    Frequency    Min 5X/week      PT Plan Current plan remains appropriate       AM-PAC PT "6 Clicks" Mobility   Outcome Measure  Help needed turning from your back to your side while in a flat bed without using bedrails?: A Little Help needed moving from lying on your back to sitting on the side of a flat bed without using bedrails?: A Little Help needed moving to and from a bed to a chair (including a wheelchair)?: A Little Help needed standing up from a chair using your arms (e.g., wheelchair or bedside chair)?: A Little Help needed to walk in hospital room?: A Little Help needed climbing 3-5 steps with a railing? : A Lot 6 Click Score: 17    End of Session Equipment Utilized During Treatment: Gait belt Activity Tolerance: Patient tolerated treatment well Patient left: in chair;with call bell/phone within reach Nurse Communication: Mobility status PT Visit Diagnosis: Other abnormalities of gait and mobility (R26.89);Pain     Time: 1610-9604 PT Time Calculation (min) (ACUTE ONLY): 34 min  Charges:  $Gait Training: 8-22 mins $Therapeutic Activity: 8-22 mins                    Johny Shock, PTA Acute Rehabilitation Services Secure Chat Preferred  [phone]   Johny Shock 11/08/2022, 3:02 PM

## 2022-11-09 LAB — GLUCOSE, CAPILLARY
Glucose-Capillary: 80 mg/dL (ref 70–99)
Glucose-Capillary: 101 mg/dL — ABNORMAL HIGH (ref 70–99)

## 2022-11-09 NOTE — Progress Notes (Addendum)
Orthopedic Surgery Post-operative Progress Note  Assessment: Patient is a 67 y.o. female who is currently admitted after undergoing L3-5 laminectomies and foraminotomies. Complicated by dural tear s/p repair   Plan: -Operative plans complete -Out of bed as tolerated, no brace needed -Drains have been removed -No bending/lifting/twisting greater than 10 pounds -PT evaluate and treat -Pain control -Diabetic diet -Restart home eliquis today -Discharge to home today  Diabetes -sliding scale insulin -monitor blood glucose  Hyperlipidemia -continue home lipitor  Hypertension -continue home amlodipine, spironolactone, lisinopril-HCTZ  Neuropathy -continue home gabapentin  Acute anemia from surgical blood loss -Hgb was 11.6 pre-operatively and was 9.1 on 11/06/2022 -will monitor for signs/symptoms of anemia  Constipation -Had a bowel movement yesterday -Continue docusate and magnesium citrate  ___________________________________________________________________________   Subjective: No acute events overnight. Walked the halls with PT yesterday. Pain well controlled. Has not had any oxycodone since yesterday midday. No radiating leg pain. Denies paresthesias and numbness.   Objective:  General: no acute distress, appropriate affect, eating breakfast Neurologic: alert, answering questions appropriately, following commands Respiratory: unlabored breathing on room air Skin: dressings clear/dry/intact  MSK (spine):  -Strength exam      Right  Left  EHL    4/5  4/5 TA    4/5  5/5 GSC    5/5  5/5 Knee extension  5/5  5/5 Hip flexion   5/5  5/5  -Sensory exam    Sensation intact to light touch in L3-S1 nerve distributions of bilateral lower extremities   Patient name: [name] Patient MRN: [id number] Date: 11/09/22

## 2022-11-10 DIAGNOSIS — Z7901 Long term (current) use of anticoagulants: Secondary | ICD-10-CM | POA: Diagnosis not present

## 2022-11-10 DIAGNOSIS — Z85038 Personal history of other malignant neoplasm of large intestine: Secondary | ICD-10-CM | POA: Diagnosis not present

## 2022-11-10 DIAGNOSIS — I2510 Atherosclerotic heart disease of native coronary artery without angina pectoris: Secondary | ICD-10-CM | POA: Diagnosis not present

## 2022-11-10 DIAGNOSIS — E039 Hypothyroidism, unspecified: Secondary | ICD-10-CM | POA: Diagnosis not present

## 2022-11-10 DIAGNOSIS — E1142 Type 2 diabetes mellitus with diabetic polyneuropathy: Secondary | ICD-10-CM | POA: Diagnosis not present

## 2022-11-10 DIAGNOSIS — K219 Gastro-esophageal reflux disease without esophagitis: Secondary | ICD-10-CM | POA: Diagnosis not present

## 2022-11-10 DIAGNOSIS — I10 Essential (primary) hypertension: Secondary | ICD-10-CM | POA: Diagnosis not present

## 2022-11-10 DIAGNOSIS — E213 Hyperparathyroidism, unspecified: Secondary | ICD-10-CM | POA: Diagnosis not present

## 2022-11-10 DIAGNOSIS — E785 Hyperlipidemia, unspecified: Secondary | ICD-10-CM | POA: Diagnosis not present

## 2022-11-10 DIAGNOSIS — M1990 Unspecified osteoarthritis, unspecified site: Secondary | ICD-10-CM | POA: Diagnosis not present

## 2022-11-10 DIAGNOSIS — Z9181 History of falling: Secondary | ICD-10-CM | POA: Diagnosis not present

## 2022-11-10 DIAGNOSIS — D649 Anemia, unspecified: Secondary | ICD-10-CM | POA: Diagnosis not present

## 2022-11-10 DIAGNOSIS — Z4789 Encounter for other orthopedic aftercare: Secondary | ICD-10-CM | POA: Diagnosis not present

## 2022-11-10 DIAGNOSIS — G4730 Sleep apnea, unspecified: Secondary | ICD-10-CM | POA: Diagnosis not present

## 2022-11-10 DIAGNOSIS — Z96653 Presence of artificial knee joint, bilateral: Secondary | ICD-10-CM | POA: Diagnosis not present

## 2022-11-10 DIAGNOSIS — F329 Major depressive disorder, single episode, unspecified: Secondary | ICD-10-CM | POA: Diagnosis not present

## 2022-11-10 DIAGNOSIS — M48062 Spinal stenosis, lumbar region with neurogenic claudication: Secondary | ICD-10-CM | POA: Diagnosis not present

## 2022-11-10 DIAGNOSIS — Z6827 Body mass index (BMI) 27.0-27.9, adult: Secondary | ICD-10-CM | POA: Diagnosis not present

## 2022-11-10 DIAGNOSIS — G8929 Other chronic pain: Secondary | ICD-10-CM | POA: Diagnosis not present

## 2022-11-10 DIAGNOSIS — Z556 Problems related to health literacy: Secondary | ICD-10-CM | POA: Diagnosis not present

## 2022-11-10 DIAGNOSIS — K5900 Constipation, unspecified: Secondary | ICD-10-CM | POA: Diagnosis not present

## 2022-11-11 ENCOUNTER — Telehealth: Payer: Self-pay

## 2022-11-11 NOTE — Transitions of Care (Post Inpatient/ED Visit) (Signed)
11/11/2022  Name: [name] MRN: [id number] DOB: Aug 11, 1955  Today's TOC FU Call Status: Today's TOC FU Call Status:: Successful TOC FU Call Competed TOC FU Call Complete Date: 11/11/22  Transition Care Management Follow-up Telephone Call Date of Discharge: 11/09/22 Discharge Facility: [facility]) Type of Discharge: Inpatient Admission Primary Inpatient Discharge Diagnosis:: L3-5 Laminectomy How have you been since you were released from the hospital?: Better Any questions or concerns?: No  Items Reviewed: Did you receive and understand the discharge instructions provided?: Yes Medications obtained,verified, and reconciled?: Yes (Medications Reviewed) Any new allergies since your discharge?: No Dietary orders reviewed?: Yes Type of Diet Ordered:: Regular Do you have support at home?: Yes People in Home: child(ren), dependent Name of Support/Comfort Primary Source: [name]- daughter  Medications Reviewed Today: Medications Reviewed Today     Reviewed by [name], RN (Case Manager) on 11/11/22 at 1244  Med List Status: <None>   Medication Order Taking? Sig Documenting Provider Last Dose Status Informant  Accu-Chek Softclix Lancets lancets [id number] Yes Use as instructed once daily Doreene Eland, MD Taking Active Self  acetaminophen (TYLENOL) 500 MG tablet [id number] No Take 2 tablets (1,000 mg total) by mouth every 8 (eight) hours for 21 days. London Sheer, MD Unknown Active   amLODipine (NORVASC) 10 MG tablet [id number] Yes Take 1 tablet (10 mg total) by mouth daily. Dollene Cleveland, DO Taking Active Self  apixaban (ELIQUIS) 5 MG TABS tablet [id number] Yes Take 1 tablet by mouth two times daily.  Must schedule OV for further refills Sheilah Pigeon, PA-C Taking Active Self           Med Note Harvest Dark, MEREDITH A   Tue Nov 05, 2022  6:55 AM) Patient unsure if she took her last dose Sunday or Saturday  atorvastatin (LIPITOR) 40 MG tablet [id number] Yes TAKE 1  TABLET BY MOUTH EVERY DAY Ganta, Anupa, DO Taking Active Self  blood glucose meter kit and supplies KIT [id number] Yes Dispense based on patient and insurance preference. Use up to four times daily as directed. Dollene Cleveland, DO Taking Active Self  buPROPion (WELLBUTRIN XL) 150 MG 24 hr tablet [id number] Yes TAKE 1 TABLET BY MOUTH EVERY DAY Ganta, Anupa, DO Taking Active Self  gabapentin (NEURONTIN) 100 MG capsule [id number] Yes Take 2 capsules (200 mg total) by mouth 2 (two) times daily. In the morning and at lunchtime  Patient taking differently: Take 100 mg by mouth 2 (two) times daily. In the morning and at lunchtime   Ganta, Anupa, DO Taking Active   gabapentin (NEURONTIN) 300 MG capsule [id number] Yes Take 1 capsule (300 mg total) by mouth at bedtime.  Patient taking differently: Take 300 mg by mouth 2 (two) times daily. Middle of the day and at bedtime   Hensel, Santiago Bumpers, MD Taking Active Self  glucose blood (ONETOUCH VERIO) test strip [id number] Yes 1 each by Other route as needed for other. Use as instructed Moses Manners, MD Taking Active Self  lisinopril-hydrochlorothiazide (ZESTORETIC) 20-25 MG tablet [id number] Yes Take 1 tablet by mouth daily. Reece Leader, DO Taking Active Self  methocarbamol (ROBAXIN) 750 MG tablet [id number] Yes Take 1 tablet (750 mg total) by mouth 4 (four) times daily for 7 days. London Sheer, MD Taking Active   Multiple Vitamins-Minerals (MULTIVITAMIN ADULT) CHEW [id number] Yes Once daily Moses Manners, MD Taking Active Self  oxyCODONE (OXY IR/ROXICODONE) 5 MG immediate release tablet [id number] Yes Take 1-2 tablets (5-10 mg total)  by mouth every 4 (four) hours as needed for up to 7 days for moderate pain or severe pain (pain score 4-10). London Sheer, MD Taking Active   polyethylene glycol (MIRALAX / GLYCOLAX) 17 g packet [id number] No Take 17 g by mouth daily for 14 days. London Sheer, MD Unknown Active   Semaglutide, 2 MG/DOSE, 8 MG/3ML SOPN  [id number] Yes Inject 2 mg into the skin once a week. Once weekly Kathrin Ruddy, RPH-CPP Taking Active Self  senna (SENOKOT) 8.6 MG TABS tablet [id number] No Take 1 tablet (8.6 mg total) by mouth 2 (two) times daily for 14 days. London Sheer, MD Unknown Active   sertraline (ZOLOFT) 100 MG tablet [id number] Yes TAKE 1 TABLET BY MOUTH EVERY DAY Ganta, Anupa, DO Taking Active Self  spironolactone (ALDACTONE) 25 MG tablet [id number] Yes TAKE 1 TABLET BY MOUTH DAILY. Sheilah Pigeon, PA-C Taking Active Self  Teduglutide, rDNA, 5 MG KIT [id number] No Inject 5 mg into the skin daily. Gattex [provider] Unknown Active Self  tobramycin (TOBREX) 0.3 % ophthalmic solution [id number] No See admin instructions. Every eight weeks [provider] Unknown Active Self            Home Care and Equipment/Supplies: Were Home Health Services Ordered?: Yes Name of Home Health Agency:: [facility] Has Agency set up a time to come to your home?: Yes First Home Health Visit Date: 11/10/22 Any new equipment or medical supplies ordered?: No  Functional Questionnaire: Do you need assistance with bathing/showering or dressing?: Yes Do you need assistance with meal preparation?: Yes Do you need assistance with eating?: No Do you have difficulty maintaining continence: No Do you need assistance with getting out of bed/getting out of a chair/moving?: Yes Do you have difficulty managing or taking your medications?: No  Follow up appointments reviewed: PCP Follow-up appointment confirmed?: Yes Date of PCP follow-up appointment?: 11/21/22 Follow-up Provider: Dr. Robyne Peers Specialist [facility] Follow-up appointment confirmed?: Yes Date of Specialist follow-up appointment?: 11/20/22 Follow-Up Specialty Provider:: Dr. Christell Constant Do you need transportation to your follow-up appointment?: No Do you understand care options if your condition(s) worsen?: Yes-patient verbalized understanding  SDOH Interventions  Today    Flowsheet Row Most Recent Value  SDOH Interventions   Food Insecurity Interventions Intervention Not Indicated  Housing Interventions Intervention Not Indicated  Transportation Interventions Intervention Not Indicated  Utilities Interventions Intervention Not Indicated  Financial Strain Interventions Intervention Not Indicated      Interventions Today    Flowsheet Row Most Recent Value  Chronic Disease   Chronic disease during today's visit Diabetes, Hypertension (HTN), Other  [L3-5 Laminectomy]  General Interventions   General Interventions Discussed/Reviewed General Interventions Discussed, Doctor Visits  Doctor Visits Discussed/Reviewed Doctor Visits Discussed, PCP  Nutrition Interventions   Nutrition Discussed/Reviewed Nutrition Discussed, Carbohydrate meal planning       TOC Interventions Today    Flowsheet Row Most Recent Value  TOC Interventions   TOC Interventions Discussed/Reviewed TOC Interventions Discussed, Post op wound/incision care, S/S of infection       [name], RN, BSN, CCM Care Management Coordinator [facility] Phone: [phone]/Fax: [phone]

## 2022-11-13 DIAGNOSIS — I2510 Atherosclerotic heart disease of native coronary artery without angina pectoris: Secondary | ICD-10-CM | POA: Diagnosis not present

## 2022-11-13 DIAGNOSIS — G8929 Other chronic pain: Secondary | ICD-10-CM | POA: Diagnosis not present

## 2022-11-13 DIAGNOSIS — M48062 Spinal stenosis, lumbar region with neurogenic claudication: Secondary | ICD-10-CM | POA: Diagnosis not present

## 2022-11-13 DIAGNOSIS — Z4789 Encounter for other orthopedic aftercare: Secondary | ICD-10-CM | POA: Diagnosis not present

## 2022-11-13 DIAGNOSIS — E1142 Type 2 diabetes mellitus with diabetic polyneuropathy: Secondary | ICD-10-CM | POA: Diagnosis not present

## 2022-11-13 DIAGNOSIS — I10 Essential (primary) hypertension: Secondary | ICD-10-CM | POA: Diagnosis not present

## 2022-11-15 ENCOUNTER — Encounter (HOSPITAL_BASED_OUTPATIENT_CLINIC_OR_DEPARTMENT_OTHER): Payer: Medicare Other | Admitting: Orthopaedic Surgery

## 2022-11-15 DIAGNOSIS — Z4789 Encounter for other orthopedic aftercare: Secondary | ICD-10-CM | POA: Diagnosis not present

## 2022-11-15 DIAGNOSIS — G8929 Other chronic pain: Secondary | ICD-10-CM | POA: Diagnosis not present

## 2022-11-15 DIAGNOSIS — I10 Essential (primary) hypertension: Secondary | ICD-10-CM | POA: Diagnosis not present

## 2022-11-15 DIAGNOSIS — I2510 Atherosclerotic heart disease of native coronary artery without angina pectoris: Secondary | ICD-10-CM | POA: Diagnosis not present

## 2022-11-15 DIAGNOSIS — E1142 Type 2 diabetes mellitus with diabetic polyneuropathy: Secondary | ICD-10-CM | POA: Diagnosis not present

## 2022-11-15 DIAGNOSIS — M48062 Spinal stenosis, lumbar region with neurogenic claudication: Secondary | ICD-10-CM | POA: Diagnosis not present

## 2022-11-18 DIAGNOSIS — Z4789 Encounter for other orthopedic aftercare: Secondary | ICD-10-CM | POA: Diagnosis not present

## 2022-11-18 DIAGNOSIS — I2510 Atherosclerotic heart disease of native coronary artery without angina pectoris: Secondary | ICD-10-CM | POA: Diagnosis not present

## 2022-11-18 DIAGNOSIS — I10 Essential (primary) hypertension: Secondary | ICD-10-CM | POA: Diagnosis not present

## 2022-11-18 DIAGNOSIS — E1142 Type 2 diabetes mellitus with diabetic polyneuropathy: Secondary | ICD-10-CM | POA: Diagnosis not present

## 2022-11-18 DIAGNOSIS — M48062 Spinal stenosis, lumbar region with neurogenic claudication: Secondary | ICD-10-CM | POA: Diagnosis not present

## 2022-11-18 DIAGNOSIS — G8929 Other chronic pain: Secondary | ICD-10-CM | POA: Diagnosis not present

## 2022-11-19 DIAGNOSIS — G8929 Other chronic pain: Secondary | ICD-10-CM | POA: Diagnosis not present

## 2022-11-19 DIAGNOSIS — I2510 Atherosclerotic heart disease of native coronary artery without angina pectoris: Secondary | ICD-10-CM | POA: Diagnosis not present

## 2022-11-19 DIAGNOSIS — I10 Essential (primary) hypertension: Secondary | ICD-10-CM | POA: Diagnosis not present

## 2022-11-19 DIAGNOSIS — E1142 Type 2 diabetes mellitus with diabetic polyneuropathy: Secondary | ICD-10-CM | POA: Diagnosis not present

## 2022-11-19 DIAGNOSIS — M48062 Spinal stenosis, lumbar region with neurogenic claudication: Secondary | ICD-10-CM | POA: Diagnosis not present

## 2022-11-19 DIAGNOSIS — Z4789 Encounter for other orthopedic aftercare: Secondary | ICD-10-CM | POA: Diagnosis not present

## 2022-11-20 ENCOUNTER — Ambulatory Visit (INDEPENDENT_AMBULATORY_CARE_PROVIDER_SITE_OTHER): Payer: Medicare Other | Admitting: Orthopedic Surgery

## 2022-11-20 DIAGNOSIS — Z9889 Other specified postprocedural states: Secondary | ICD-10-CM

## 2022-11-20 NOTE — Progress Notes (Addendum)
Orthopedic Surgery Post-operative Office Visit  Procedure: L3-5 lumbar laminectomies and foraminotomies Date of Surgery: 11/05/2022 (~2 weeks post-op)  Assessment: Patient is a 67 y.o. who has had resolution of her leg/buttock pain after surgery   Plan: -Operative plans complete -Out of bed as tolerated, no brace -No bending/lifting/twisting greater than 10 pounds -Okay to let soap/water run over the incision, do not submerge -Pain management: OTC medications -Return to office in 4 weeks, lumbar x-rays needed at next visit: None  ___________________________________________________________________________   Subjective: Patient has been doing well since discharge from the hospital.  She is ambulating around her neighborhood with a cane.  She said she will walk up and down her street at least once a day.  She is not having any pain radiating into her buttock or legs.  Her postsurgical back pain has gotten more tolerable with time.  She is still taking a periodic oxycodone to help with the pain.  She has not noticed any redness or drainage around her incision. No headaches, nausea, photophobia.   Objective:  General: no acute distress, appropriate affect Neurologic: alert, answering questions appropriately, following commands Respiratory: unlabored breathing on room air Skin: incision is well-approximated with no erythema, induration, active/expressible drainage  MSK (spine):  -Strength exam      Left  Right  EHL    4/5  4/5 TA    5/5  5/5 GSC    5/5  5/5 Knee extension  5/5  5/5 Hip flexion   5/5  5/5  -Sensory exam    Sensation intact to light touch in L3-S1 nerve distributions of bilateral lower extremities  Imaging: None obtained at today's visit   Patient name: [name] Patient MRN: [id number] Date of visit: 11/20/22

## 2022-11-21 ENCOUNTER — Encounter: Payer: Self-pay | Admitting: Family Medicine

## 2022-11-21 ENCOUNTER — Other Ambulatory Visit: Payer: Self-pay

## 2022-11-21 ENCOUNTER — Ambulatory Visit (INDEPENDENT_AMBULATORY_CARE_PROVIDER_SITE_OTHER): Payer: Medicare Other | Admitting: Family Medicine

## 2022-11-21 VITALS — BP 128/64 | HR 75 | Ht 68.0 in | Wt 171.8 lb

## 2022-11-21 DIAGNOSIS — R4586 Emotional lability: Secondary | ICD-10-CM

## 2022-11-21 DIAGNOSIS — E1169 Type 2 diabetes mellitus with other specified complication: Secondary | ICD-10-CM

## 2022-11-21 DIAGNOSIS — M545 Low back pain, unspecified: Secondary | ICD-10-CM | POA: Diagnosis not present

## 2022-11-21 DIAGNOSIS — Z1231 Encounter for screening mammogram for malignant neoplasm of breast: Secondary | ICD-10-CM

## 2022-11-21 DIAGNOSIS — E785 Hyperlipidemia, unspecified: Secondary | ICD-10-CM

## 2022-11-21 NOTE — Assessment & Plan Note (Signed)
-  history of stenosis, s/p laminectomy and following with ortho. Doing well and experiencing relatively minimal to no pain. -no signs of infection along incision site -follow up with ortho scheduled 7/3

## 2022-11-21 NOTE — Assessment & Plan Note (Signed)
-  patient politely declines PHQ-9 completion, denies SI or HI. Mood has been doing well since last visit. 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 -continue zoloft daily -continue wellbutrin -follow up in 1 month per patient preference

## 2022-11-21 NOTE — Progress Notes (Signed)
    SUBJECTIVE:   CHIEF COMPLAINT / HPI:   Patient presents for mood follow up. 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 Describes mood as okay. Taking wellbutrin 150 mg and zoloft 100 mg daily. Denies suicidal or homicidal ideations.   History of lumbar stenosis. Had her laminectomy earlier this month and had a follow up with orthopedics which went well. Feeling good, has some pain but much better. Back and leg pain have improved significantly.   OBJECTIVE:   BP 128/64   Pulse 75   Ht 5\' 8"  (1.727 m)   Wt 171 lb 12.8 oz (77.9 kg)   SpO2 100%   BMI 26.12 kg/m   General: Patient well-appearing, in no acute distress. CV: RRR, no murmurs or gallops auscultated Resp: CTAB, no wheezing, rales or rhonchi noted  MSK: scar noted along lumbar spine with stiches in place, clean and dry without drainage or bleeding, no surrounding erythema noted, no tenderness to palpation noted  Psych: mood and affect appropriate, pleasant, denies SI or HI  ASSESSMENT/PLAN:   Mood changes -patient politely declines PHQ-9 completion, denies SI or HI. Mood has been doing well since last visit. 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 -continue zoloft daily -continue wellbutrin -follow up in 1 month per patient preference   Low back pain -history of stenosis, s/p laminectomy and following with ortho. Doing well and experiencing relatively minimal to no pain. -no signs of infection along incision site -follow up with ortho scheduled 7/3  Hyperlipidemia associated with type 2 diabetes mellitus (HCC) -discussed the importance of continued statin therapy given history of DM although well-controlled  -continue atorvastatin daily    Health maintenance -Mammogram ordered, patient is aware that she will need to schedule at her earliest convenience. Contact information provided to patient.     Reece Leader, DO [facility]

## 2022-11-21 NOTE — Patient Instructions (Signed)
It was great seeing you today!  Today we discussed your mood, please continue to take zoloft and welbutrin daily.  Glad that the back surgery went well and you are feeling so much better.  Please continue to take your atorvastatin daily as we discussed.   I have placed an order for a mammogram for breast cancer screening, please call to schedule an appointment at your earliest convenience.   Please follow up at your next scheduled appointment on 6/27 at 10:30 am, if anything arises between now and then, please don't hesitate to contact our office.   Thank you for allowing [place] to be a part of your medical care!  Thank you, Dr. Robyne Peers

## 2022-11-21 NOTE — Assessment & Plan Note (Signed)
-  discussed the importance of continued statin therapy given history of DM although well-controlled  -continue atorvastatin daily

## 2022-11-22 DIAGNOSIS — M48062 Spinal stenosis, lumbar region with neurogenic claudication: Secondary | ICD-10-CM | POA: Diagnosis not present

## 2022-11-22 DIAGNOSIS — Z4789 Encounter for other orthopedic aftercare: Secondary | ICD-10-CM | POA: Diagnosis not present

## 2022-11-22 DIAGNOSIS — G8929 Other chronic pain: Secondary | ICD-10-CM | POA: Diagnosis not present

## 2022-11-22 DIAGNOSIS — I2510 Atherosclerotic heart disease of native coronary artery without angina pectoris: Secondary | ICD-10-CM | POA: Diagnosis not present

## 2022-11-22 DIAGNOSIS — I10 Essential (primary) hypertension: Secondary | ICD-10-CM | POA: Diagnosis not present

## 2022-11-22 DIAGNOSIS — E1142 Type 2 diabetes mellitus with diabetic polyneuropathy: Secondary | ICD-10-CM | POA: Diagnosis not present

## 2022-11-25 DIAGNOSIS — I2510 Atherosclerotic heart disease of native coronary artery without angina pectoris: Secondary | ICD-10-CM | POA: Diagnosis not present

## 2022-11-25 DIAGNOSIS — E1142 Type 2 diabetes mellitus with diabetic polyneuropathy: Secondary | ICD-10-CM | POA: Diagnosis not present

## 2022-11-25 DIAGNOSIS — I10 Essential (primary) hypertension: Secondary | ICD-10-CM | POA: Diagnosis not present

## 2022-11-25 DIAGNOSIS — M48062 Spinal stenosis, lumbar region with neurogenic claudication: Secondary | ICD-10-CM | POA: Diagnosis not present

## 2022-11-25 DIAGNOSIS — Z4789 Encounter for other orthopedic aftercare: Secondary | ICD-10-CM | POA: Diagnosis not present

## 2022-11-25 DIAGNOSIS — G8929 Other chronic pain: Secondary | ICD-10-CM | POA: Diagnosis not present

## 2022-11-28 DIAGNOSIS — M48062 Spinal stenosis, lumbar region with neurogenic claudication: Secondary | ICD-10-CM | POA: Diagnosis not present

## 2022-11-28 DIAGNOSIS — G8929 Other chronic pain: Secondary | ICD-10-CM | POA: Diagnosis not present

## 2022-11-28 DIAGNOSIS — E1142 Type 2 diabetes mellitus with diabetic polyneuropathy: Secondary | ICD-10-CM | POA: Diagnosis not present

## 2022-11-28 DIAGNOSIS — Z4789 Encounter for other orthopedic aftercare: Secondary | ICD-10-CM | POA: Diagnosis not present

## 2022-11-28 DIAGNOSIS — I10 Essential (primary) hypertension: Secondary | ICD-10-CM | POA: Diagnosis not present

## 2022-11-28 DIAGNOSIS — I2510 Atherosclerotic heart disease of native coronary artery without angina pectoris: Secondary | ICD-10-CM | POA: Diagnosis not present

## 2022-12-04 ENCOUNTER — Telehealth: Payer: Self-pay | Admitting: Orthopedic Surgery

## 2022-12-04 DIAGNOSIS — I2510 Atherosclerotic heart disease of native coronary artery without angina pectoris: Secondary | ICD-10-CM | POA: Diagnosis not present

## 2022-12-04 DIAGNOSIS — M48062 Spinal stenosis, lumbar region with neurogenic claudication: Secondary | ICD-10-CM | POA: Diagnosis not present

## 2022-12-04 DIAGNOSIS — E1142 Type 2 diabetes mellitus with diabetic polyneuropathy: Secondary | ICD-10-CM | POA: Diagnosis not present

## 2022-12-04 DIAGNOSIS — Z4789 Encounter for other orthopedic aftercare: Secondary | ICD-10-CM | POA: Diagnosis not present

## 2022-12-04 DIAGNOSIS — G8929 Other chronic pain: Secondary | ICD-10-CM | POA: Diagnosis not present

## 2022-12-04 DIAGNOSIS — I10 Essential (primary) hypertension: Secondary | ICD-10-CM | POA: Diagnosis not present

## 2022-12-04 NOTE — Telephone Encounter (Signed)
Please advise, she has a follow up appt on 12/25/22

## 2022-12-04 NOTE — Telephone Encounter (Signed)
Please advise, she has a follow up appt on 12/25/22 

## 2022-12-04 NOTE — Telephone Encounter (Signed)
I called and advised [name] of Dr. Kathi Der message

## 2022-12-04 NOTE — Telephone Encounter (Signed)
[name] (OT) from [facility] called with an FYI. Pt feel Sunday and has alittle pain on right hip. No brusing or wounds seen. [name] secure number is [phone].

## 2022-12-10 ENCOUNTER — Other Ambulatory Visit: Payer: Self-pay

## 2022-12-10 DIAGNOSIS — Z556 Problems related to health literacy: Secondary | ICD-10-CM | POA: Diagnosis not present

## 2022-12-10 DIAGNOSIS — Z85038 Personal history of other malignant neoplasm of large intestine: Secondary | ICD-10-CM | POA: Diagnosis not present

## 2022-12-10 DIAGNOSIS — G6289 Other specified polyneuropathies: Secondary | ICD-10-CM

## 2022-12-10 DIAGNOSIS — M48062 Spinal stenosis, lumbar region with neurogenic claudication: Secondary | ICD-10-CM | POA: Diagnosis not present

## 2022-12-10 DIAGNOSIS — K219 Gastro-esophageal reflux disease without esophagitis: Secondary | ICD-10-CM | POA: Diagnosis not present

## 2022-12-10 DIAGNOSIS — Z9181 History of falling: Secondary | ICD-10-CM | POA: Diagnosis not present

## 2022-12-10 DIAGNOSIS — E1142 Type 2 diabetes mellitus with diabetic polyneuropathy: Secondary | ICD-10-CM | POA: Diagnosis not present

## 2022-12-10 DIAGNOSIS — E785 Hyperlipidemia, unspecified: Secondary | ICD-10-CM | POA: Diagnosis not present

## 2022-12-10 DIAGNOSIS — I2510 Atherosclerotic heart disease of native coronary artery without angina pectoris: Secondary | ICD-10-CM | POA: Diagnosis not present

## 2022-12-10 DIAGNOSIS — F329 Major depressive disorder, single episode, unspecified: Secondary | ICD-10-CM | POA: Diagnosis not present

## 2022-12-10 DIAGNOSIS — M1990 Unspecified osteoarthritis, unspecified site: Secondary | ICD-10-CM | POA: Diagnosis not present

## 2022-12-10 DIAGNOSIS — Z7901 Long term (current) use of anticoagulants: Secondary | ICD-10-CM | POA: Diagnosis not present

## 2022-12-10 DIAGNOSIS — I10 Essential (primary) hypertension: Secondary | ICD-10-CM | POA: Diagnosis not present

## 2022-12-10 DIAGNOSIS — K5900 Constipation, unspecified: Secondary | ICD-10-CM | POA: Diagnosis not present

## 2022-12-10 DIAGNOSIS — Z96653 Presence of artificial knee joint, bilateral: Secondary | ICD-10-CM | POA: Diagnosis not present

## 2022-12-10 DIAGNOSIS — E039 Hypothyroidism, unspecified: Secondary | ICD-10-CM | POA: Diagnosis not present

## 2022-12-10 DIAGNOSIS — D649 Anemia, unspecified: Secondary | ICD-10-CM | POA: Diagnosis not present

## 2022-12-10 DIAGNOSIS — E213 Hyperparathyroidism, unspecified: Secondary | ICD-10-CM | POA: Diagnosis not present

## 2022-12-10 DIAGNOSIS — Z6827 Body mass index (BMI) 27.0-27.9, adult: Secondary | ICD-10-CM | POA: Diagnosis not present

## 2022-12-10 DIAGNOSIS — Z4789 Encounter for other orthopedic aftercare: Secondary | ICD-10-CM | POA: Diagnosis not present

## 2022-12-10 DIAGNOSIS — G8929 Other chronic pain: Secondary | ICD-10-CM | POA: Diagnosis not present

## 2022-12-10 DIAGNOSIS — G4730 Sleep apnea, unspecified: Secondary | ICD-10-CM | POA: Diagnosis not present

## 2022-12-10 MED ORDER — GABAPENTIN 300 MG PO CAPS: 300.0000 mg | ORAL_CAPSULE | Freq: Every day | ORAL | 3 refills | Status: DC

## 2022-12-19 ENCOUNTER — Ambulatory Visit: Payer: Medicare Other | Admitting: Family Medicine

## 2022-12-19 ENCOUNTER — Encounter: Payer: Self-pay | Admitting: Family Medicine

## 2022-12-19 VITALS — BP 119/65 | HR 75 | Ht 68.0 in | Wt 167.1 lb

## 2022-12-19 DIAGNOSIS — E1140 Type 2 diabetes mellitus with diabetic neuropathy, unspecified: Secondary | ICD-10-CM | POA: Diagnosis not present

## 2022-12-19 DIAGNOSIS — G8929 Other chronic pain: Secondary | ICD-10-CM | POA: Diagnosis not present

## 2022-12-19 DIAGNOSIS — M48062 Spinal stenosis, lumbar region with neurogenic claudication: Secondary | ICD-10-CM | POA: Diagnosis not present

## 2022-12-19 DIAGNOSIS — Z4789 Encounter for other orthopedic aftercare: Secondary | ICD-10-CM | POA: Diagnosis not present

## 2022-12-19 DIAGNOSIS — I2510 Atherosclerotic heart disease of native coronary artery without angina pectoris: Secondary | ICD-10-CM | POA: Diagnosis not present

## 2022-12-19 DIAGNOSIS — R4586 Emotional lability: Secondary | ICD-10-CM

## 2022-12-19 DIAGNOSIS — I10 Essential (primary) hypertension: Secondary | ICD-10-CM | POA: Diagnosis not present

## 2022-12-19 DIAGNOSIS — E1142 Type 2 diabetes mellitus with diabetic polyneuropathy: Secondary | ICD-10-CM | POA: Diagnosis not present

## 2022-12-19 NOTE — Progress Notes (Signed)
    SUBJECTIVE:   CHIEF COMPLAINT / HPI:   Patient presents for follow up for mood. Reports compliance on zoloft and wellbutrin daily. Feels that her mood has been doing better. 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 He gets mad at times but she says that she has been handling things well and they always end up resolving things as a couple. She tells me that they have been married for 35 years and happy. Daughter is a significant support. Denies any self harm thoughts.   OBJECTIVE:   BP 119/65   Pulse 75   Ht 5\' 8"  (1.727 m)   Wt 167 lb 2 oz (75.8 kg)   SpO2 100%   BMI 25.41 kg/m   General: Patient well-appearing, in no acute distress. Resp: normal work of breathing noted Psych: mood appropriate, pleasant  ASSESSMENT/PLAN:   Mood changes -doing very well, seems to be continuously improving  -continue zoloft and wellbutrin -encouraged trying more of her hobbies and making time for these for often  -stressed the importance of self-care  -reassurance provided and reiterated coping strategies -follow up as appropriate   Type 2 diabetes, controlled, with neuropathy (HCC) -recent A1c 5.6, at goal of <8 -continue current DM regimen -follow up scheduled with Dr. Jena Gauss in 2 months for repeat A1c      [name] Robyne Peers, DO [facility]

## 2022-12-19 NOTE — Patient Instructions (Addendum)
It was great seeing you today!  Today we discussed your mood and other things. I am so glad that you are doing well!  Your new doctor will be Dr. Jena Gauss!  Please follow up at your next scheduled appointment with on 8/28 at 10:10 am Dr. Jena Gauss, if anything arises between now and then, please don't hesitate to contact our office.   Thank you for allowing [place] to be a part of your medical care!  Thank you, Dr. Robyne Peers

## 2022-12-19 NOTE — Assessment & Plan Note (Addendum)
-  doing very well, seems to be continuously improving  -continue zoloft and wellbutrin -encouraged trying more of her hobbies and making time for these for often  -stressed the importance of self-care  -reassurance provided and reiterated coping strategies -follow up as appropriate

## 2022-12-19 NOTE — Assessment & Plan Note (Signed)
-  recent A1c 5.6, at goal of <8 -continue current DM regimen -follow up scheduled with Dr. Jena Gauss in 2 months for repeat A1c

## 2022-12-20 DIAGNOSIS — E113313 Type 2 diabetes mellitus with moderate nonproliferative diabetic retinopathy with macular edema, bilateral: Secondary | ICD-10-CM | POA: Diagnosis not present

## 2022-12-25 ENCOUNTER — Other Ambulatory Visit (INDEPENDENT_AMBULATORY_CARE_PROVIDER_SITE_OTHER): Payer: Medicare Other

## 2022-12-25 ENCOUNTER — Ambulatory Visit (INDEPENDENT_AMBULATORY_CARE_PROVIDER_SITE_OTHER): Payer: Medicare Other | Admitting: Orthopedic Surgery

## 2022-12-25 DIAGNOSIS — Z9889 Other specified postprocedural states: Secondary | ICD-10-CM

## 2022-12-25 MED ORDER — OXYCODONE HCL 5 MG PO TABS: 2.5000 mg | ORAL_TABLET | ORAL | 0 refills | Status: AC | PRN

## 2022-12-26 DIAGNOSIS — I10 Essential (primary) hypertension: Secondary | ICD-10-CM | POA: Diagnosis not present

## 2022-12-26 DIAGNOSIS — I2510 Atherosclerotic heart disease of native coronary artery without angina pectoris: Secondary | ICD-10-CM | POA: Diagnosis not present

## 2022-12-26 DIAGNOSIS — G8929 Other chronic pain: Secondary | ICD-10-CM | POA: Diagnosis not present

## 2022-12-26 DIAGNOSIS — E1142 Type 2 diabetes mellitus with diabetic polyneuropathy: Secondary | ICD-10-CM | POA: Diagnosis not present

## 2022-12-26 DIAGNOSIS — Z4789 Encounter for other orthopedic aftercare: Secondary | ICD-10-CM | POA: Diagnosis not present

## 2022-12-26 DIAGNOSIS — M48062 Spinal stenosis, lumbar region with neurogenic claudication: Secondary | ICD-10-CM | POA: Diagnosis not present

## 2022-12-26 NOTE — Progress Notes (Addendum)
Orthopedic Surgery Post-operative Office Visit   Procedure: L3-5 lumbar laminectomies and foraminotomies Date of Surgery: 11/05/2022 (~6 weeks post-op)   Assessment: Patient is a 67 y.o. who has had resolution of her leg/buttock pain after surgery     Plan: -Operative plans complete -Out of bed as tolerated, no brace -No spine specific precautions -Okay to submerge wound at this time -Pain management: OTC medications -Return to office in 6 weeks, lumbar x-rays needed at next visit: None   ___________________________________________________________________________     Subjective: Patient has been at home.  She is ambulating without assistive devices.  She is not having any pain radiating to her buttock or legs.  She did have a fall at home in the last week and hit her anterior leg.  She noticed ecchymosis around the tibia.  She has been able to weight-bear and pain has been getting better but that has caused her pain in the area of ecchymosis.  This is different than her leg pain before surgery and does not radiate down her leg.  She does not have any other complaints at this time.  She has been using over-the-counter medications for her tibia pain but previously was not using any medication for pain.  She has not noticed any redness or drainage around her incision site.    Objective:   General: no acute distress, appropriate affect Neurologic: alert, answering questions appropriately, following commands Respiratory: unlabored breathing on room air Skin: incision is well healed with no erythema, induration, active/expressible drainage   MSK (spine):   -Strength exam                                                   Left                  Right   EHL                              4/5                  4/5 TA                                 5/5                  5/5 GSC                             5/5                  5/5 Knee extension            5/5                  5/5 Hip flexion                     5/5                  5/5   -Sensory exam                           Sensation intact to light touch in L3-S1 nerve distributions of  bilateral lower extremities   Imaging: XR of the lumbar spine from 12/26/2022 was independently reviewed and interpreted, showing retrolisthesis of L3 on L4. Disc height loss at multiple levels. Lumbar scoliosis with apex to the right at L3. No fracture or dislocation. No evidence of instability on flexion/extension. Laminectomy defect seen from L3-5.     Patient name: [name] Patient MRN: [id number] Date of visit: 12/25/2022

## 2022-12-27 ENCOUNTER — Ambulatory Visit (INDEPENDENT_AMBULATORY_CARE_PROVIDER_SITE_OTHER): Payer: Medicare Other | Admitting: Family

## 2022-12-27 ENCOUNTER — Encounter: Payer: Self-pay | Admitting: Family

## 2022-12-27 DIAGNOSIS — L84 Corns and callosities: Secondary | ICD-10-CM | POA: Diagnosis not present

## 2022-12-27 DIAGNOSIS — L97521 Non-pressure chronic ulcer of other part of left foot limited to breakdown of skin: Secondary | ICD-10-CM | POA: Diagnosis not present

## 2022-12-27 NOTE — Progress Notes (Signed)
Office Visit Note   Patient: [name]           Date of Birth: 08-14-1955           MRN: [id number] Visit Date: 12/27/2022              Requested by: Alfredo Martinez, MD [address] [place],  [place] [address] PCP: Alfredo Martinez, MD  Chief Complaint  Patient presents with   Left Foot - Follow-up      HPI: Patient is a 67 year old woman who presents with lateral left foot pain.  Patient has a history of venous stasis insufficiency and plantar calluses.    Presents for repeat debridement of painful calluses.  Assessment & Plan: Visit Diagnoses:  1. Non-pressure chronic ulcer of other part of left foot limited to breakdown of skin (HCC)   2. Corn of foot     Plan: The hyperkeratotic lesions were debrided.  Recommended Hoka sneakers. Given donuts for felt pressure relief. Discussed corn pads  Follow-Up Instructions: No follow-ups on file.   Ortho Exam  Patient is alert, oriented, no adenopathy, well-dressed, normal affect, normal respiratory effort. Examination patient has venous stasis changes without ulcers.  There is a hyperkeratotic lesion on the left heel this was pared without complications.  She does have hallux rigidus of the great toe with dorsiflexion is 0 degrees. Prominent left metatarsal base. She has a palpable dorsal spur she states that she did have surgery on this years ago.  Imaging: No results found. No images are attached to the encounter.  Labs: Lab Results  Component Value Date   HGBA1C 5.6 10/30/2022   HGBA1C 5.3 10/17/2022   HGBA1C 5.7 06/28/2022   ESRSEDRATE 62 (H) 09/28/2007   ESRSEDRATE 67 (H) 09/21/2007   ESRSEDRATE 86 (H) 09/17/2007   REPTSTATUS 08/07/2020 FINAL 08/04/2020   GRAMSTAIN  01/07/2012    NO WBC SEEN NO SQUAMOUS EPITHELIAL CELLS SEEN NO ORGANISMS SEEN   CULT  08/04/2020    NO GROUP A STREP (S.PYOGENES) ISOLATED Performed at [facility], [address], [place], [place] [address]    [facility] STAPHYLOCOCCUS  AUREUS 01/07/2012     Lab Results  Component Value Date   ALBUMIN 3.9 10/30/2022   ALBUMIN 4.5 11/05/2021   ALBUMIN 3.9 02/15/2021   PREALBUMIN 20.4 05/25/2015    Lab Results  Component Value Date   MG 2.1 05/07/2017   MG 2.0 05/25/2015   MG 1.6 (L) 11/10/2013   Lab Results  Component Value Date   VD25OH 21.04 (L) 05/12/2020   VD25OH 20.4 (L) 04/29/2018   VD25OH 14.8 (L) 10/13/2017    Lab Results  Component Value Date   PREALBUMIN 20.4 05/25/2015      Latest Ref Rng & Units 11/06/2022    6:34 AM 11/05/2022   12:51 PM 11/05/2022   10:54 AM  CBC EXTENDED  WBC 4.0 - 10.5 K/uL 6.3     RBC 3.87 - 5.11 MIL/uL 3.28     Hemoglobin 12.0 - 15.0 g/dL 9.1  9.5  9.2   HCT 56.3 - 46.0 % 28.9  28.0  27.0   Platelets 150 - 400 K/uL 192        There is no height or weight on file to calculate BMI.  Orders:  No orders of the defined types were placed in this encounter.  No orders of the defined types were placed in this encounter.    Procedures: No procedures performed  Clinical Data: No  additional findings.  ROS:  All other systems negative, except as noted in the HPI. Review of Systems  Objective: Vital Signs: There were no vitals taken for this visit.  Specialty Comments:  No specialty comments available.  PMFS History: Patient Active Problem List   Diagnosis Date Noted   Lumbar stenosis with neurogenic claudication 11/05/2022   Callus of foot 06/28/2022   Tremor 11/05/2021   Mood changes 07/17/2021   Neck muscle spasm 03/17/2021   Type 2 DM with diabetic neuropathy affecting both sides of body (HCC) 01/09/2021   Right leg swelling 12/27/2020   Parathyroid adenoma 11/23/2020   Osteoporosis screening 11/16/2020   Memory loss 08/07/2020   Serum calcium elevated 04/12/2020   Hallux rigidus of left foot 04/12/2020   Hyperparathyroidism (HCC) 04/11/2020   Presbycusis of both ears 04/11/2020   No-show for appointment 03/08/2020   Pacemaker     Hypothyroidism 07/19/2019   Hypertension associated with diabetes (HCC) 07/11/2018   Hyperlipidemia associated with type 2 diabetes mellitus (HCC) 07/11/2018   Short bowel syndrome 07/11/2018   Status post left knee replacement 07/11/2018   Major depression in remission (HCC) 07/07/2018   Coronary artery disease involving native coronary artery of native heart without angina pectoris 07/07/2018   Paroxysmal atrial fibrillation (HCC) 05/11/2018   Age-related cognitive decline 04/30/2018   Gastroesophageal reflux disease without esophagitis 04/15/2017   B12 deficiency 12/13/2016   Tubular adenoma of colon 03/09/2016   Vitamin D deficiency 03/09/2016   S/P bariatric surgery-duodenal switch with sleeve gastrectomy 12/04/2013   Morbid obesity (HCC) 08/26/2013   Iron deficiency anemia 06/19/2012   Low back pain 03/05/2012   PSORIASIS 05/28/2010   Peripheral neuropathy 01/12/2009   Type 2 diabetes, controlled, with neuropathy (HCC) 02/18/2008   Obesity with sleep apnea 06/12/2007   Past Medical History:  Diagnosis Date   Advanced directives, counseling/discussion 04/12/2020   Allergy    Anemia    ANEMIA, PERNICIOUS, HX OF 05/13/2007   Anxiety    Arthritis    ASYMPTOMATIC POSTMENOPAUSAL STATUS 02/18/2008   B12 deficiency 12/13/2016   BACK PAIN, LUMBAR 11/19/2007   Blood in urine 07/19/2019   Bowel incontinence    Cataract    Cellulitis of left lower leg 10/28/2013   Cellulitis of leg, right 10/11/2010   Chronic depression 10/06/2007   Chronic dermatitis of hands 05/02/2009   Coronary artery disease involving native coronary artery of native heart without angina pectoris 07/07/2018   Last Assessment & Plan:  Formatting of this note might be different from the original. The patient reports remote cardiac catheterization with up to 40% plaque.  Subsequent Cardiolite stress test in October 2019 did not show any evidence of active ischemia or prior infarction. Formatting of this note  might be different from the original. medical  Last Assessment & Plan:  Formatting of this note mi   DEPRESSION, CHRONIC 10/06/2007   Diabetes mellitus without complication (HCC)    DIABETES MELLITUS, WITH NEUROLOGICAL COMPLICATIONS 02/18/2008   Diabetic neuropathy (HCC)    DYSLIPIDEMIA 05/13/2007   Dyslipidemia 04/15/2017   Last Assessment & Plan:  The patient will continue atorvastatin for her dyslipidemia.   Dyslipidemia associated with type 2 diabetes mellitus (HCC) 07/11/2018   Essential hypertension 03/20/2010   Family history of colon cancer    Fecal incontinence 08/02/2015   Frozen shoulder    Lt   Full dentures    Gastroesophageal reflux disease without esophagitis 04/15/2017   Generalized abdominal pain 02/05/2018   Genetic testing 11/24/2017  Negative genetic testing on the common hereditary cancer panel.  The Hereditary Gene Panel offered by [facility] includes sequencing and/or deletion duplication testing of the following 47 genes: APC, ATM, AXIN2, BARD1, BMPR1A, BRCA1, BRCA2, BRIP1, CDH1, CDK4, CDKN2A (p14ARF), CDKN2A (p16INK4a), CHEK2, CTNNA1, DICER1, EPCAM (Deletion/duplication testing only), GREM1 (promoter region deletion/duplicat   GERD (gastroesophageal reflux disease)    GI bleed 03/15/2016   GOITER, MULTINODULAR 05/13/2007   GOITER, MULTINODULAR 05/13/2007    (last TSH 3.26; US soft tissue neck in 09/2004 showed multinodular goiter with specific small nodule for which was recommended to be followed by repeat US in 3-6 months 12/2016: Thyroid US: recommend repeat in 1 year (one nodule 1.7cm on left thyroid)    Headache    History of blood transfusion    Hx of colonic polyps 12/04/2010   HYPERCHOLESTEROLEMIA 03/20/2010   Hyperparathyroidism (HCC) 04/11/2020   HYPERTENSION 03/20/2010   Hypertension associated with diabetes (HCC) 07/11/2018   Iron deficiency anemia 06/19/2012   Low back pain 03/05/2012   Completed PT-notes say very motivated and had good progress.   CT  (03/2020: Mild curvature convex to the right in the upper lumbar region into left in lower lumbar region. No antero or retrolisthesis in the supine position. retrolisthesis at L3-4 of 3 mm with flexion which increases to 6 mm with neutral and extension. Multifactorial spinal stenosis at L3-4 due to circumferential protrusion of the    Lower back pain    Major depression in remission (HCC) 07/07/2018   Last Assessment & Plan:  Mood has seemed good on sertraline.   Malabsorption 03/12/2016   NASH (nonalcoholic steatohepatitis)    Nausea 07/19/2019   Need for immunization against influenza 04/12/2020   No-show for appointment 03/08/2020   Nonobstructive CAD  08/26/2013   The patient has prior cardiac workup in 2006 when she underwent a stress test for abnormal EKG. The stress test was nonconclusive and she underwent cardiac catheterization that showed 30% stenosis in the LAD in 25% stenosis in RCA she was treated medically.     OA (osteoarthritis)    OBSTRUCTIVE SLEEP APNEA 06/12/2007   uses CPAP.   Pacemaker    Paroxysmal atrial fibrillation (HCC) 05/11/2018   Last Assessment & Plan:  Patient was noted to have episodes of paroxysmal atrial fibrillation that was predominantly rate controlled and at a relatively low burden.  She is chronically on anticoagulation now with Eliquis.  She does not take any negative chronotropic agents with her sinus bradycardia.  If she has any breakthrough more prolonged tachycardic episodes and requires negative chronotropi   Peripheral autonomic neuropathy due to diabetes mellitus (HCC) 07/11/2018   PERIPHERAL NEUROPATHY 01/12/2009   Peripheral neuropathy 01/12/2009   Peripheral vascular disease (HCC)    Presbycusis of both ears 04/11/2020   Primary osteoarthritis involving multiple joints 07/07/2018   Last Assessment & Plan:  Post op left tka and here for rehab as her husband is handicapped so she has limited help at home. Consitpation noted, some pain overnight,  better with a pillow under her knee.   Primary osteoarthritis of left knee 04/23/2011   Previously seen by Dr. Dorene Grebe. Will plan on repeat referral after trial injection.      PSORIASIS 05/28/2010   Rectal prolapse 05/24/2015   Rectovaginal fistula 05/25/2014   Renal insufficiency    stage 1 kd   S/P bariatric surgery-duodenal switch with sleeve gastrectomy 12/04/2013   S/P total knee arthroplasty, right 11/16/2019   Screening mammogram, encounter for 07/12/2019  Second degree AV block 02/08/2020   Serum calcium elevated 04/12/2020   Short bowel syndrome 07/11/2018   Spigelian hernia 02/05/2018   Status post left knee replacement 07/11/2018   Stress 05/24/2015   Surgical counseling visit 10/02/2019   Tubular adenoma of colon 03/09/2016   Colonoscopy in 10/2015: 3  tubular adenomas; GI recommends repeat colonoscopy in 3 years Colonoscopy August 2012: 3 tubular adenomas.    Type 2 diabetes, controlled, with neuropathy (HCC) 02/18/2008   Umbilical hernia without obstruction and without gangrene 04/15/2017   Unilateral primary osteoarthritis, left knee    UNSPECIFIED VENOUS INSUFFICIENCY 05/28/2010   Patient with frequent ulceration related to venous stasis-seen at wound care. Edema and Venous stasis changes due to this . Echo 04/16/11 with EF 60%. No signs diastolic dysfunction.        Vaccination against Streptococcus pneumoniae within past 5 years 04/12/2020   Vaginal irritation 07/19/2019   Venous stasis dermatitis of right lower extremity 01/02/2018   Vitamin D deficiency 03/09/2016   Wears glasses     Family History  Problem Relation Age of Onset   Hyperlipidemia Father    Hypertension Father    Cirrhosis Father    Colon cancer Father 94       d. 7   Colon cancer Sister 60       d. 70   Bipolar disorder Sister    Aneurysm Mother        brain   Cerebral aneurysm Mother    Colon cancer Sister 33       d. 72   Bipolar disorder Sister    Cancer Paternal Aunt         NOS, ? colon   Congestive Heart Failure Maternal Grandmother    Drug abuse Neg Hx    CAD Neg Hx    Stomach cancer Neg Hx    Hyperparathyroidism Neg Hx    Hypercalcemia Neg Hx     Past Surgical History:  Procedure Laterality Date   CARDIAC CATHETERIZATION  2006   CATARACT EXTRACTION W/ INTRAOCULAR LENS  IMPLANT, BILATERAL     COLONOSCOPY     DECOMPRESSIVE LUMBAR LAMINECTOMY LEVEL 2 N/A 11/05/2022   Procedure: L3-4, L4-5 DECOMPRESSIVE LUMBAR LAMINECTOMY LEVEL 2;  Surgeon: London Sheer, MD;  Location: [facility] OR;  Service: Orthopedics;  Laterality: N/A;   ELECTROCARDIOGRAM  04/16/2006   EXAMINATION UNDER ANESTHESIA N/A 06/29/2014   Procedure: EXAM UNDER ANESTHESIA;  Surgeon: Sherian Rein, MD;  Location: [facility] ORS;  Service: Gynecology;  Laterality: N/A;   Exercise myoview  01/24/2005   FLEXIBLE SIGMOIDOSCOPY     GASTRIC BYPASS  11/09/2013   JOINT REPLACEMENT     KNEE ARTHROSCOPY  2003   right   LAPAROSCOPY N/A 03/12/2016   Procedure: LAPAROSCOPIC ANASTOMOSIS OF INTESTINE (ENTEROENTEROSTOMY);  Surgeon: Everette Rank, MD;  Location: [facility] ORS;  Service: General;  Laterality: N/A;   LEG SURGERY Left    metal and pins in lower left leg   mrsa Right    arm   MULTIPLE TOOTH EXTRACTIONS     PACEMAKER LEADLESS INSERTION N/A 02/08/2020   Procedure: PACEMAKER LEADLESS INSERTION;  Surgeon: Hillis Range, MD;  Location: [facility];  Service: Cardiovascular;  Laterality: N/A;   PARATHYROIDECTOMY Left 02/19/2021   Procedure: LEFT PARATHYROIDECTOMY WITH FROZEN SECTION;  Surgeon: Serena Colonel, MD;  Location: [facility] OR;  Service: ENT;  Laterality: Left;   SHOULDER ARTHROSCOPY W/ ROTATOR CUFF REPAIR Right    stab phlebectomy  Right 02/10/2019  stab phlebectomy > 20 incisions right leg by Fabienne Bruns MD    TONSILLECTOMY     TOTAL KNEE ARTHROPLASTY Left 06/30/2018   Procedure: LEFT TOTAL KNEE ARTHROPLASTY;  Surgeon: Cammy Copa, MD;  Location: [facility] OR;  Service: Orthopedics;   Laterality: Left;   TOTAL KNEE ARTHROPLASTY Right 11/16/2019   Procedure: RIGHT TOTAL KNEE ARTHROPLASTY-CEMENTED;  Surgeon: Cammy Copa, MD;  Location: [facility] OR;  Service: Orthopedics;  Laterality: Right;   VARICOSE VEIN SURGERY     Remotef   Social History   Occupational History   Occupation: Unemployed    Comment: disabled  Tobacco Use   Smoking status: Former    Packs/day: 2.00    Years: 24.00    Additional pack years: 0.00    Total pack years: 48.00    Types: Cigarettes    Start date: 06/24/1970    Quit date: 08/06/1994    Years since quitting: 28.4   Smokeless tobacco: Never  Vaping Use   Vaping Use: Never used  Substance and Sexual Activity   Alcohol use: No    Alcohol/week: 0.0 standard drinks of alcohol   Drug use: No   Sexual activity: Not Currently

## 2022-12-31 ENCOUNTER — Other Ambulatory Visit: Payer: Self-pay | Admitting: Orthopedic Surgery

## 2022-12-31 ENCOUNTER — Other Ambulatory Visit: Payer: Self-pay

## 2022-12-31 DIAGNOSIS — R4586 Emotional lability: Secondary | ICD-10-CM

## 2023-01-02 ENCOUNTER — Encounter: Payer: Self-pay | Admitting: Student

## 2023-01-02 DIAGNOSIS — Z4789 Encounter for other orthopedic aftercare: Secondary | ICD-10-CM | POA: Diagnosis not present

## 2023-01-02 DIAGNOSIS — I10 Essential (primary) hypertension: Secondary | ICD-10-CM | POA: Diagnosis not present

## 2023-01-02 DIAGNOSIS — I2510 Atherosclerotic heart disease of native coronary artery without angina pectoris: Secondary | ICD-10-CM | POA: Diagnosis not present

## 2023-01-02 DIAGNOSIS — M48062 Spinal stenosis, lumbar region with neurogenic claudication: Secondary | ICD-10-CM | POA: Diagnosis not present

## 2023-01-02 DIAGNOSIS — E1142 Type 2 diabetes mellitus with diabetic polyneuropathy: Secondary | ICD-10-CM | POA: Diagnosis not present

## 2023-01-02 DIAGNOSIS — G8929 Other chronic pain: Secondary | ICD-10-CM | POA: Diagnosis not present

## 2023-01-02 MED ORDER — BUPROPION HCL ER (XL) 150 MG PO TB24: 150.0000 mg | ORAL_TABLET | Freq: Every day | ORAL | 0 refills | Status: DC

## 2023-01-06 DIAGNOSIS — I10 Essential (primary) hypertension: Secondary | ICD-10-CM | POA: Diagnosis not present

## 2023-01-06 DIAGNOSIS — Z4789 Encounter for other orthopedic aftercare: Secondary | ICD-10-CM | POA: Diagnosis not present

## 2023-01-06 DIAGNOSIS — G8929 Other chronic pain: Secondary | ICD-10-CM | POA: Diagnosis not present

## 2023-01-06 DIAGNOSIS — M48062 Spinal stenosis, lumbar region with neurogenic claudication: Secondary | ICD-10-CM | POA: Diagnosis not present

## 2023-01-06 DIAGNOSIS — E1142 Type 2 diabetes mellitus with diabetic polyneuropathy: Secondary | ICD-10-CM | POA: Diagnosis not present

## 2023-01-06 DIAGNOSIS — I2510 Atherosclerotic heart disease of native coronary artery without angina pectoris: Secondary | ICD-10-CM | POA: Diagnosis not present

## 2023-01-08 ENCOUNTER — Telehealth: Payer: Self-pay

## 2023-01-08 NOTE — Telephone Encounter (Signed)
Enrique Sack [facility] PT calls nurse line in regards to patient.   She reports she sees her husband for [facility] PT. She reports some concerns she has noticed in patient.   She reports memory difficulties over the last several weeks. She reports the patient has not been eating very well, however reports she is on Ozempic.   Patient has an apt with PCP already scheduled for 8/28.  Will forward to PCP to see if patient should be seen sooner.   Please advise.

## 2023-01-09 ENCOUNTER — Other Ambulatory Visit: Payer: Self-pay | Admitting: Physician Assistant

## 2023-01-09 DIAGNOSIS — Z85038 Personal history of other malignant neoplasm of large intestine: Secondary | ICD-10-CM | POA: Diagnosis not present

## 2023-01-09 DIAGNOSIS — M48062 Spinal stenosis, lumbar region with neurogenic claudication: Secondary | ICD-10-CM | POA: Diagnosis not present

## 2023-01-09 DIAGNOSIS — E213 Hyperparathyroidism, unspecified: Secondary | ICD-10-CM | POA: Diagnosis not present

## 2023-01-09 DIAGNOSIS — G4730 Sleep apnea, unspecified: Secondary | ICD-10-CM | POA: Diagnosis not present

## 2023-01-09 DIAGNOSIS — M1990 Unspecified osteoarthritis, unspecified site: Secondary | ICD-10-CM | POA: Diagnosis not present

## 2023-01-09 DIAGNOSIS — Z96653 Presence of artificial knee joint, bilateral: Secondary | ICD-10-CM | POA: Diagnosis not present

## 2023-01-09 DIAGNOSIS — Z6827 Body mass index (BMI) 27.0-27.9, adult: Secondary | ICD-10-CM | POA: Diagnosis not present

## 2023-01-09 DIAGNOSIS — I2510 Atherosclerotic heart disease of native coronary artery without angina pectoris: Secondary | ICD-10-CM | POA: Diagnosis not present

## 2023-01-09 DIAGNOSIS — Z7901 Long term (current) use of anticoagulants: Secondary | ICD-10-CM | POA: Diagnosis not present

## 2023-01-09 DIAGNOSIS — G8929 Other chronic pain: Secondary | ICD-10-CM | POA: Diagnosis not present

## 2023-01-09 DIAGNOSIS — E039 Hypothyroidism, unspecified: Secondary | ICD-10-CM | POA: Diagnosis not present

## 2023-01-09 DIAGNOSIS — E1142 Type 2 diabetes mellitus with diabetic polyneuropathy: Secondary | ICD-10-CM | POA: Diagnosis not present

## 2023-01-09 DIAGNOSIS — Z556 Problems related to health literacy: Secondary | ICD-10-CM | POA: Diagnosis not present

## 2023-01-09 DIAGNOSIS — D649 Anemia, unspecified: Secondary | ICD-10-CM | POA: Diagnosis not present

## 2023-01-09 DIAGNOSIS — K5900 Constipation, unspecified: Secondary | ICD-10-CM | POA: Diagnosis not present

## 2023-01-09 DIAGNOSIS — F329 Major depressive disorder, single episode, unspecified: Secondary | ICD-10-CM | POA: Diagnosis not present

## 2023-01-09 DIAGNOSIS — K219 Gastro-esophageal reflux disease without esophagitis: Secondary | ICD-10-CM | POA: Diagnosis not present

## 2023-01-09 DIAGNOSIS — E785 Hyperlipidemia, unspecified: Secondary | ICD-10-CM | POA: Diagnosis not present

## 2023-01-09 DIAGNOSIS — Z9181 History of falling: Secondary | ICD-10-CM | POA: Diagnosis not present

## 2023-01-09 DIAGNOSIS — I10 Essential (primary) hypertension: Secondary | ICD-10-CM | POA: Diagnosis not present

## 2023-01-09 NOTE — Telephone Encounter (Signed)
This is a [employer] pt

## 2023-01-09 NOTE — Progress Notes (Signed)
Subjective:   [name] is a 67 y.o. female who presents for Medicare Annual (Subsequent) preventive examination.  Visit Complete: Virtual  I connected with  [name] on 01/10/23 by a audio enabled telemedicine application and verified that I am speaking with the correct person using two identifiers.  Patient Location: Home  Provider Location: Home Office  I discussed the limitations of evaluation and management by telemedicine. The patient expressed understanding and agreed to proceed.  Review of Systems     Cardiac Risk Factors include: advanced age (>10men, >46 women);hypertension;diabetes mellitus;dyslipidemia;sedentary lifestyle  Per patient no change in vitals since last visit, unable to obtain new vitals due to telehealth visit     Objective:    Today's Vitals   01/10/23 1155  Weight: 167 lb (75.8 kg)  Height: 5\' 8"  (1.727 m)   Body mass index is 25.39 kg/m.     01/10/2023    2:01 PM 12/19/2022   10:37 AM 10/30/2022    9:35 AM 10/17/2022   10:09 AM 09/18/2022   10:16 AM 06/28/2022    9:58 AM 06/07/2022    2:08 PM  Advanced Directives  Does Patient Have a Medical Advance Directive? No No No No No No No  Would patient like information on creating a medical advance directive? Yes (MAU/Ambulatory/Procedural Areas - Information given) No - Patient declined No - Patient declined No - Patient declined No - Patient declined No - Patient declined No - Patient declined    Current Medications (verified) Outpatient Encounter Medications as of 01/10/2023  Medication Sig   Accu-Chek Softclix Lancets lancets Use as instructed once daily   amLODipine (NORVASC) 10 MG tablet Take 1 tablet (10 mg total) by mouth daily.   apixaban (ELIQUIS) 5 MG TABS tablet Take 1 tablet by mouth two times daily.  Must schedule OV for further refills   atorvastatin (LIPITOR) 40 MG tablet TAKE 1 TABLET BY MOUTH EVERY DAY   blood glucose meter kit and supplies KIT Dispense based on patient and  insurance preference. Use up to four times daily as directed.   buPROPion (WELLBUTRIN XL) 150 MG 24 hr tablet Take 1 tablet (150 mg total) by mouth daily.   gabapentin (NEURONTIN) 100 MG capsule Take 2 capsules (200 mg total) by mouth 2 (two) times daily. In the morning and at lunchtime (Patient taking differently: Take 100 mg by mouth 2 (two) times daily. In the morning and at lunchtime)   gabapentin (NEURONTIN) 300 MG capsule Take 1 capsule (300 mg total) by mouth at bedtime.   glucose blood (ONETOUCH VERIO) test strip 1 each by Other route as needed for other. Use as instructed   lisinopril-hydrochlorothiazide (ZESTORETIC) 20-25 MG tablet Take 1 tablet by mouth daily.   Multiple Vitamins-Minerals (MULTIVITAMIN ADULT) CHEW Once daily   Semaglutide, 2 MG/DOSE, 8 MG/3ML SOPN Inject 2 mg into the skin once a week. Once weekly   senna ([employer] SENNA) 8.6 MG tablet Take 1 tablet (8.6 mg total) by mouth 2 (two) times daily.   sertraline (ZOLOFT) 100 MG tablet TAKE 1 TABLET BY MOUTH EVERY DAY   spironolactone (ALDACTONE) 25 MG tablet TAKE 1 TABLET BY MOUTH EVERY DAY   Teduglutide, rDNA, 5 MG KIT Inject 5 mg into the skin daily. Gattex   tobramycin (TOBREX) 0.3 % ophthalmic solution See admin instructions. Every eight weeks   No facility-administered encounter medications on file as of 01/10/2023.    Allergies (verified) Cephalexin, Latex, Neomycin-bacitracin zn-polymyx, and Tape  History: Past Medical History:  Diagnosis Date   Advanced directives, counseling/discussion 04/12/2020   Allergy    Anemia    ANEMIA, PERNICIOUS, HX OF 05/13/2007   Anxiety    Arthritis    ASYMPTOMATIC POSTMENOPAUSAL STATUS 02/18/2008   B12 deficiency 12/13/2016   BACK PAIN, LUMBAR 11/19/2007   Blood in urine 07/19/2019   Bowel incontinence    Cataract    Cellulitis of left lower leg 10/28/2013   Cellulitis of leg, right 10/11/2010   Chronic depression 10/06/2007   Chronic dermatitis of hands 05/02/2009    Coronary artery disease involving native coronary artery of native heart without angina pectoris 07/07/2018   Last Assessment & Plan:  Formatting of this note might be different from the original. The patient reports remote cardiac catheterization with up to 40% plaque.  Subsequent Cardiolite stress test in October 2019 did not show any evidence of active ischemia or prior infarction. Formatting of this note might be different from the original. medical  Last Assessment & Plan:  Formatting of this note mi   DEPRESSION, CHRONIC 10/06/2007   Diabetes mellitus without complication (HCC)    DIABETES MELLITUS, WITH NEUROLOGICAL COMPLICATIONS 02/18/2008   Diabetic neuropathy (HCC)    DYSLIPIDEMIA 05/13/2007   Dyslipidemia 04/15/2017   Last Assessment & Plan:  The patient will continue atorvastatin for her dyslipidemia.   Dyslipidemia associated with type 2 diabetes mellitus (HCC) 07/11/2018   Essential hypertension 03/20/2010   Family history of colon cancer    Fecal incontinence 08/02/2015   Frozen shoulder    Lt   Full dentures    Gastroesophageal reflux disease without esophagitis 04/15/2017   Generalized abdominal pain 02/05/2018   Genetic testing 11/24/2017   Negative genetic testing on the common hereditary cancer panel.  The Hereditary Gene Panel offered by [facility] includes sequencing and/or deletion duplication testing of the following 47 genes: APC, ATM, AXIN2, BARD1, BMPR1A, BRCA1, BRCA2, BRIP1, CDH1, CDK4, CDKN2A (p14ARF), CDKN2A (p16INK4a), CHEK2, CTNNA1, DICER1, EPCAM (Deletion/duplication testing only), GREM1 (promoter region deletion/duplicat   GERD (gastroesophageal reflux disease)    GI bleed 03/15/2016   GOITER, MULTINODULAR 05/13/2007   GOITER, MULTINODULAR 05/13/2007    (last TSH 3.26; US soft tissue neck in 09/2004 showed multinodular goiter with specific small nodule for which was recommended to be followed by repeat US in 3-6 months 12/2016: Thyroid US: recommend repeat in 1  year (one nodule 1.7cm on left thyroid)    Headache    History of blood transfusion    Hx of colonic polyps 12/04/2010   HYPERCHOLESTEROLEMIA 03/20/2010   Hyperparathyroidism (HCC) 04/11/2020   HYPERTENSION 03/20/2010   Hypertension associated with diabetes (HCC) 07/11/2018   Iron deficiency anemia 06/19/2012   Low back pain 03/05/2012   Completed PT-notes say very motivated and had good progress.   CT (03/2020: Mild curvature convex to the right in the upper lumbar region into left in lower lumbar region. No antero or retrolisthesis in the supine position. retrolisthesis at L3-4 of 3 mm with flexion which increases to 6 mm with neutral and extension. Multifactorial spinal stenosis at L3-4 due to circumferential protrusion of the    Lower back pain    Major depression in remission (HCC) 07/07/2018   Last Assessment & Plan:  Mood has seemed good on sertraline.   Malabsorption 03/12/2016   NASH (nonalcoholic steatohepatitis)    Nausea 07/19/2019   Need for immunization against influenza 04/12/2020   No-show for appointment 03/08/2020   Nonobstructive CAD  08/26/2013  The patient has prior cardiac workup in 2006 when she underwent a stress test for abnormal EKG. The stress test was nonconclusive and she underwent cardiac catheterization that showed 30% stenosis in the LAD in 25% stenosis in RCA she was treated medically.     OA (osteoarthritis)    OBSTRUCTIVE SLEEP APNEA 06/12/2007   uses CPAP.   Pacemaker    Paroxysmal atrial fibrillation (HCC) 05/11/2018   Last Assessment & Plan:  Patient was noted to have episodes of paroxysmal atrial fibrillation that was predominantly rate controlled and at a relatively low burden.  She is chronically on anticoagulation now with Eliquis.  She does not take any negative chronotropic agents with her sinus bradycardia.  If she has any breakthrough more prolonged tachycardic episodes and requires negative chronotropi   Peripheral autonomic neuropathy due to  diabetes mellitus (HCC) 07/11/2018   PERIPHERAL NEUROPATHY 01/12/2009   Peripheral neuropathy 01/12/2009   Peripheral vascular disease (HCC)    Presbycusis of both ears 04/11/2020   Primary osteoarthritis involving multiple joints 07/07/2018   Last Assessment & Plan:  Post op left tka and here for rehab as her husband is handicapped so she has limited help at home. Consitpation noted, some pain overnight, better with a pillow under her knee.   Primary osteoarthritis of left knee 04/23/2011   Previously seen by Dr. Dorene Grebe. Will plan on repeat referral after trial injection.      PSORIASIS 05/28/2010   Rectal prolapse 05/24/2015   Rectovaginal fistula 05/25/2014   Renal insufficiency    stage 1 kd   S/P bariatric surgery-duodenal switch with sleeve gastrectomy 12/04/2013   S/P total knee arthroplasty, right 11/16/2019   Screening mammogram, encounter for 07/12/2019   Second degree AV block 02/08/2020   Serum calcium elevated 04/12/2020   Short bowel syndrome 07/11/2018   Spigelian hernia 02/05/2018   Status post left knee replacement 07/11/2018   Stress 05/24/2015   Surgical counseling visit 10/02/2019   Tubular adenoma of colon 03/09/2016   Colonoscopy in 10/2015: 3  tubular adenomas; GI recommends repeat colonoscopy in 3 years Colonoscopy August 2012: 3 tubular adenomas.    Type 2 diabetes, controlled, with neuropathy (HCC) 02/18/2008   Umbilical hernia without obstruction and without gangrene 04/15/2017   Unilateral primary osteoarthritis, left knee    UNSPECIFIED VENOUS INSUFFICIENCY 05/28/2010   Patient with frequent ulceration related to venous stasis-seen at wound care. Edema and Venous stasis changes due to this . Echo 04/16/11 with EF 60%. No signs diastolic dysfunction.        Vaccination against Streptococcus pneumoniae within past 5 years 04/12/2020   Vaginal irritation 07/19/2019   Venous stasis dermatitis of right lower extremity 01/02/2018   Vitamin D deficiency  03/09/2016   Wears glasses    Past Surgical History:  Procedure Laterality Date   CARDIAC CATHETERIZATION  2006   CATARACT EXTRACTION W/ INTRAOCULAR LENS  IMPLANT, BILATERAL     COLONOSCOPY     DECOMPRESSIVE LUMBAR LAMINECTOMY LEVEL 2 N/A 11/05/2022   Procedure: L3-4, L4-5 DECOMPRESSIVE LUMBAR LAMINECTOMY LEVEL 2;  Surgeon: London Sheer, MD;  Location: [facility] OR;  Service: Orthopedics;  Laterality: N/A;   ELECTROCARDIOGRAM  04/16/2006   EXAMINATION UNDER ANESTHESIA N/A 06/29/2014   Procedure: EXAM UNDER ANESTHESIA;  Surgeon: Sherian Rein, MD;  Location: [facility] ORS;  Service: Gynecology;  Laterality: N/A;   Exercise myoview  01/24/2005   FLEXIBLE SIGMOIDOSCOPY     GASTRIC BYPASS  11/09/2013   JOINT REPLACEMENT  KNEE ARTHROSCOPY  2003   right   LAPAROSCOPY N/A 03/12/2016   Procedure: LAPAROSCOPIC ANASTOMOSIS OF INTESTINE (ENTEROENTEROSTOMY);  Surgeon: Everette Rank, MD;  Location: [facility] ORS;  Service: General;  Laterality: N/A;   LEG SURGERY Left    metal and pins in lower left leg   mrsa Right    arm   MULTIPLE TOOTH EXTRACTIONS     PACEMAKER LEADLESS INSERTION N/A 02/08/2020   Procedure: PACEMAKER LEADLESS INSERTION;  Surgeon: Hillis Range, MD;  Location: [facility];  Service: Cardiovascular;  Laterality: N/A;   PARATHYROIDECTOMY Left 02/19/2021   Procedure: LEFT PARATHYROIDECTOMY WITH FROZEN SECTION;  Surgeon: Serena Colonel, MD;  Location: [facility] OR;  Service: ENT;  Laterality: Left;   SHOULDER ARTHROSCOPY W/ ROTATOR CUFF REPAIR Right    stab phlebectomy  Right 02/10/2019   stab phlebectomy > 20 incisions right leg by Fabienne Bruns MD    TONSILLECTOMY     TOTAL KNEE ARTHROPLASTY Left 06/30/2018   Procedure: LEFT TOTAL KNEE ARTHROPLASTY;  Surgeon: Cammy Copa, MD;  Location: [facility] OR;  Service: Orthopedics;  Laterality: Left;   TOTAL KNEE ARTHROPLASTY Right 11/16/2019   Procedure: RIGHT TOTAL KNEE ARTHROPLASTY-CEMENTED;  Surgeon: Cammy Copa, MD;   Location: [facility] OR;  Service: Orthopedics;  Laterality: Right;   VARICOSE VEIN SURGERY     Remotef   Family History  Problem Relation Age of Onset   Hyperlipidemia Father    Hypertension Father    Cirrhosis Father    Colon cancer Father 74       d. 69   Colon cancer Sister 72       d. 70   Bipolar disorder Sister    Aneurysm Mother        brain   Cerebral aneurysm Mother    Colon cancer Sister 23       d. 40   Bipolar disorder Sister    Cancer Paternal Aunt        NOS, ? colon   Congestive Heart Failure Maternal Grandmother    Drug abuse Neg Hx    CAD Neg Hx    Stomach cancer Neg Hx    Hyperparathyroidism Neg Hx    Hypercalcemia Neg Hx    Social History   Socioeconomic History   Marital status: Married    Spouse name: Tammy Sours   Number of children: 2   Years of education: 12   Highest education level: High school graduate  Occupational History   Occupation: Unemployed    Comment: disabled  Tobacco Use   Smoking status: Former    Current packs/day: 0.00    Average packs/day: 2.0 packs/day for 24.1 years (48.2 ttl pk-yrs)    Types: Cigarettes    Start date: 06/24/1970    Quit date: 08/06/1994    Years since quitting: 28.4   Smokeless tobacco: Never  Vaping Use   Vaping status: Never Used  Substance and Sexual Activity   Alcohol use: No    Alcohol/week: 0.0 standard drinks of alcohol   Drug use: No   Sexual activity: Not Currently  Other Topics Concern   Not on file  Social History Narrative   Patient lives with husband in [place]. Tammy Sours is a [facility] patient.    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    Daughter lives in nearby and is a great help.   Son lives in [place].    2 dogs and 1 cat.   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   Social Determinants of Health   Financial Resource Strain: Low Risk  (01/10/2023)   Overall Financial Resource Strain (CARDIA)    Difficulty of Paying Living Expenses: Not hard at all  Food Insecurity: No  Food Insecurity (01/10/2023)   Hunger Vital Sign    Worried About Running Out of Food in the Last Year: Never true    Ran Out of Food in the Last Year: Never true  Transportation Needs: No Transportation Needs (01/10/2023)   PRAPARE - Administrator, Civil Service (Medical): No    Lack of Transportation (Non-Medical): No  Physical Activity: Inactive (01/10/2023)   Exercise Vital Sign    Days of Exercise per Week: 0 days    Minutes of Exercise per Session: 0 min  Stress: No Stress Concern Present (01/10/2023)   [employer] of Occupational Health - Occupational Stress Questionnaire    Feeling of Stress : Only a little  Social Connections: Moderately Isolated (01/10/2023)   Social Connection and Isolation Panel [NHANES]    Frequency of Communication with Friends and Family: More than three times a week    Frequency of Social Gatherings with Friends and Family: Three times a week    Attends Religious Services: Never    Active Member of Clubs or Organizations: No    Attends Engineer, structural: Never    Marital Status: Married    Tobacco Counseling Counseling given: Not Answered   Clinical Intake:  Pre-visit preparation completed: Yes  Pain : No/denies pain     Diabetes: Yes CBG done?: No Did pt. bring in CBG monitor from home?: No  How often do you need to have someone help you when you read instructions, pamphlets, or other written materials from your doctor or pharmacy?: 1 - Never  Interpreter Needed?: No  Information entered by :: Kandis Fantasia LPN   Activities of Daily Living    01/10/2023    2:00 PM 10/30/2022    9:37 AM  In your present state of health, do you have any difficulty performing the following activities:  Hearing? 0   Vision? 0   Difficulty concentrating or making decisions? 0   Walking or climbing stairs? 0   Dressing or bathing? 0   Doing errands, shopping? 0 0  Preparing Food and eating ? N   Using the Toilet? N   In  the past six months, have you accidently leaked urine? N   Do you have problems with loss of bowel control? N   Managing your Medications? N   Managing your Finances? N   Housekeeping or managing your Housekeeping? N     Patient Care Team: Alfredo Martinez, MD as PCP - General (Family Medicine) Lanier Prude, MD as PCP - Electrophysiology (Cardiology) Linna Darner, RD as Dietitian (Family Medicine) Fredrich Birks, OD as Referring Physician (Optometry) Alva Garnet Tyrone Apple, MD as Attending Physician (Bariatrics) August Saucer, Corrie Mckusick, MD as Consulting Physician (Orthopedic Surgery) Jeralyn Ruths, MD as Consulting Physician (Oncology)  Indicate any recent Medical Services you may have received from other than [facility] providers in the past year (date may be approximate).     Assessment:   This is a routine wellness examination for Michille.  Hearing/Vision screen Hearing Screening - Comments:: Denies hearing difficulties   Vision Screening - Comments::  up to date with routine eye exams with Dr. Allyne Gee    Dietary issues and exercise activities discussed:     Goals Addressed   None  Depression Screen    01/10/2023    1:59 PM 12/19/2022   10:37 AM 11/21/2022   11:15 AM 09/18/2022   10:20 AM 06/28/2022    9:58 AM 06/07/2022    2:06 PM 05/24/2022    2:38 PM  PHQ 2/9 Scores  PHQ - 2 Score 0    0 0   PHQ- 9 Score     1 1   Exception Documentation  Patient refusal Patient refusal Patient refusal   Patient refusal    Fall Risk    01/10/2023    2:00 PM 12/19/2022   10:37 AM 10/17/2022   10:09 AM 09/18/2022   10:54 AM 09/18/2022   10:16 AM  Fall Risk   Falls in the past year? 1 1 1  0 1  Number falls in past yr: 1 0 0 0 0  Injury with Fall? 1 1 0 0 0  Risk for fall due to : History of fall(s);Impaired balance/gait;Impaired mobility      Follow up Education provided;Falls prevention discussed;Falls evaluation completed        MEDICARE RISK AT HOME:  Medicare Risk at Home -  01/10/23 1400     Any stairs in or around the home? No    If so, are there any without handrails? No    Home free of loose throw rugs in walkways, pet beds, electrical cords, etc? Yes    Adequate lighting in your home to reduce risk of falls? Yes    Life alert? No    Use of a cane, walker or w/c? No    Grab bars in the bathroom? Yes    Shower chair or bench in shower? Yes    Elevated toilet seat or a handicapped toilet? Yes             TIMED UP AND GO:  Was the test performed?  No    Cognitive Function:      09/11/2020    1:24 PM  Montreal Cognitive Assessment   Visuospatial/ Executive (0/5) 5  Naming (0/3) 2  Attention: Read list of digits (0/2) 2  Attention: Read list of letters (0/1) 1  Attention: Serial 7 subtraction starting at 100 (0/3) 3  Language: Repeat phrase (0/2) 1  Language : Fluency (0/1) 1  Abstraction (0/2) 1  Delayed Recall (0/5) 4  Orientation (0/6) 6  Total 26  Adjusted Score (based on education) 27      01/10/2023    2:01 PM 06/12/2021   10:00 AM 11/10/2018   10:11 AM  6CIT Screen  What Year? 0 points 0 points 0 points  What month? 0 points 0 points 0 points  What time? 0 points 0 points 0 points  Count back from 20 0 points 0 points 0 points  Months in reverse 2 points 0 points 0 points  Repeat phrase 2 points 0 points 0 points  Total Score 4 points 0 points 0 points    Immunizations Immunization History  Administered Date(s) Administered   COVID-19, mRNA, vaccine(Comirnaty)12 years and older 06/28/2022   Fluad Quad(high Dose 65+) 03/16/2021, 04/17/2022   Influenza Split 04/23/2011, 04/03/2012   Influenza,inj,Quad PF,6+ Mos 03/01/2013, 04/14/2014, 07/25/2015, 02/20/2016, 02/25/2017, 03/04/2018, 02/19/2019, 04/04/2020   Moderna Sars-Covid-2 Vaccination 09/04/2019, 10/06/2019, 04/08/2020   PFIZER Comirnaty(Gray Top)Covid-19 Tri-Sucrose Vaccine 11/16/2020   PNEUMOCOCCAL CONJUGATE-20 06/01/2021   Pneumococcal Conjugate-13 04/03/2020    Pneumococcal Polysaccharide-23 07/12/2010, 11/17/2015   Td 01/18/2005   Tdap 09/17/2011   Zoster Recombinant(Shingrix) 11/12/2021    TDAP  status: Due, Education has been provided regarding the importance of this vaccine. Advised may receive this vaccine at local pharmacy or Health Dept. Aware to provide a copy of the vaccination record if obtained from local pharmacy or Health Dept. Verbalized acceptance and understanding.  Pneumococcal vaccine status: Up to date  Covid-19 vaccine status: Information provided on how to obtain vaccines.   Qualifies for Shingles Vaccine? Yes   Zostavax completed No   Shingrix Completed?: No.    Education has been provided regarding the importance of this vaccine. Patient has been advised to call insurance company to determine out of pocket expense if they have not yet received this vaccine. Advised may also receive vaccine at local pharmacy or Health Dept. Verbalized acceptance and understanding.  Screening Tests Health Maintenance  Topic Date Due   DTaP/Tdap/Td (3 - Td or Tdap) 09/16/2021   Zoster Vaccines- Shingrix (2 of 2) 01/07/2022   COVID-19 Vaccine (6 - 2023-24 season) 08/23/2022   MAMMOGRAM  10/12/2022   Diabetic kidney evaluation - Urine ACR  10/17/2027 (Originally 10/31/2018)   INFLUENZA VACCINE  01/23/2023   OPHTHALMOLOGY EXAM  01/29/2023   HEMOGLOBIN A1C  05/02/2023   FOOT EXAM  06/29/2023   Diabetic kidney evaluation - eGFR measurement  11/06/2023   Colonoscopy  12/17/2023   Medicare Annual Wellness (AWV)  01/10/2024   Pneumonia Vaccine 22+ Years old  Completed   DEXA SCAN  Completed   Hepatitis C Screening  Completed   HPV VACCINES  Aged Out    Health Maintenance  Health Maintenance Due  Topic Date Due   DTaP/Tdap/Td (3 - Td or Tdap) 09/16/2021   Zoster Vaccines- Shingrix (2 of 2) 01/07/2022   COVID-19 Vaccine (6 - 2023-24 season) 08/23/2022   MAMMOGRAM  10/12/2022    Colorectal cancer screening: Type of screening:  Colonoscopy. Completed 12/17/18. Repeat every 5 years  Mammogram status: Ordered 11/21/22. Pt provided with contact info and advised to call to schedule appt.   Bone Density status: Ordered today. Pt provided with contact info and advised to call to schedule appt.  Lung Cancer Screening: (Low Dose CT Chest recommended if Age 39-80 years, 20 pack-year currently smoking OR have quit w/in 15years.) does not qualify.   Lung Cancer Screening Referral: n/a  Additional Screening:  Hepatitis C Screening: does qualify; Completed 02/12/08  Vision Screening: Recommended annual ophthalmology exams for early detection of glaucoma and other disorders of the eye. Is the patient up to date with their annual eye exam?  Yes  Who is the provider or what is the name of the office in which the patient attends annual eye exams? Dr. Allyne Gee  If pt is not established with a provider, would they like to be referred to a provider to establish care? No .   Dental Screening: Recommended annual dental exams for proper oral hygiene  Diabetic Foot Exam: Diabetic Foot Exam: Completed 06/28/22  Community Resource Referral / Chronic Care Management: CRR required this visit?  No   CCM required this visit?  No     Plan:     I have personally reviewed and noted the following in the patient's chart:   Medical and social history Use of alcohol, tobacco or illicit drugs  Current medications and supplements including opioid prescriptions. Patient is not currently taking opioid prescriptions. Functional ability and status Nutritional status Physical activity Advanced directives List of other physicians Hospitalizations, surgeries, and ER visits in previous 12 months Vitals Screenings to include cognitive, depression, and falls Referrals  and appointments  In addition, I have reviewed and discussed with patient certain preventive protocols, quality metrics, and best practice recommendations. A written personalized  care plan for preventive services as well as general preventive health recommendations were provided to patient.     Kandis Fantasia [place], [place]   [id number]/82/[id number]   After Visit Summary: (MyChart) Due to this being a telephonic visit, the after visit summary with patients personalized plan was offered to patient via MyChart   Nurse Notes: No concerns at this time

## 2023-01-09 NOTE — Patient Instructions (Addendum)
[name] , Thank you for taking time to come for your Medicare Wellness Visit. I appreciate your ongoing commitment to your health goals. Please review the following plan we discussed and let me know if I can assist you in the future.   These are the goals we discussed:  Goals       Blood Pressure < 140/90      Exercise 3x per week (30 min per time) (pt-stated)      HEMOGLOBIN A1C < 7.0      Weight (lb) < 198 lb (89.8 kg)      7% weight loss        This is a list of the screening recommended for you and due dates:  Health Maintenance  Topic Date Due   DTaP/Tdap/Td vaccine (3 - Td or Tdap) 09/16/2021   Zoster (Shingles) Vaccine (2 of 2) 01/07/2022   Medicare Annual Wellness Visit  06/12/2022   COVID-19 Vaccine (6 - 2023-24 season) 08/23/2022   Mammogram  10/12/2022   Yearly kidney health urinalysis for diabetes  10/17/2027*   Flu Shot  01/23/2023   Eye exam for diabetics  01/29/2023   Hemoglobin A1C  05/02/2023   Complete foot exam   06/29/2023   Yearly kidney function blood test for diabetes  11/06/2023   Colon Cancer Screening  12/17/2023   Pneumonia Vaccine  Completed   DEXA scan (bone density measurement)  Completed   Hepatitis C Screening  Completed   HPV Vaccine  Aged Out  *Topic was postponed. The date shown is not the original due date.    Advanced directives: Information on Advanced Care Planning can be found at [facility] Advance Health Care Directives Advance Health Care Directives ([email or web address] Please bring a copy of your health care power of attorney and living will to the office to be added to your chart at your convenience.  Conditions/risks identified: Aim for 30 minutes of exercise or brisk walking, 6-8 glasses of water, and 5 servings of fruits and vegetables each day.  Next appointment: Follow up in one year for your annual wellness visit   You have an order for:  []   2D Mammogram  [x]   3D Mammogram  [x]   Bone Density     Please  call for appointment:   [facility] [address] [place], [place] [address] [phone]  Make sure to wear two-piece clothing.  No lotions, powders, or deodorants the day of the appointment. Make sure to bring picture ID and insurance card.  Bring list of medications you are currently taking including any supplements.   Schedule your [facility] screening mammogram through MyChart!   Log into your MyChart account.  Go to 'Visit' (or 'Appointments' if on mobile App) --> Schedule an Appointment  Under 'Select a Reason for Visit' choose the Mammogram Screening option.  Complete the pre-visit questions and select the time and place that best fits your schedule.     Preventive Care 68 Years and Older, Female Preventive care refers to lifestyle choices and visits with your health care provider that can promote health and wellness. What does preventive care include? A yearly physical exam. This is also called an annual well check. Dental exams once or twice a year. Routine eye exams. Ask your health care provider how often you should have your eyes checked. Personal lifestyle choices, including: Daily care of your teeth and gums. Regular physical activity. Eating a healthy diet. Avoiding tobacco and drug  use. Limiting alcohol use. Practicing safe sex. Taking low-dose aspirin every day. Taking vitamin and mineral supplements as recommended by your health care provider. What happens during an annual well check? The services and screenings done by your health care provider during your annual well check will depend on your age, overall health, lifestyle risk factors, and family history of disease. Counseling  Your health care provider may ask you questions about your: Alcohol use. Tobacco use. Drug use. Emotional well-being. Home and relationship well-being. Sexual activity. Eating habits. History of falls. Memory and ability to understand  (cognition). Work and work Astronomer. Reproductive health. Screening  You may have the following tests or measurements: Height, weight, and BMI. Blood pressure. Lipid and cholesterol levels. These may be checked every 5 years, or more frequently if you are over 41 years old. Skin check. Lung cancer screening. You may have this screening every year starting at age 49 if you have a 30-pack-year history of smoking and currently smoke or have quit within the past 15 years. Fecal occult blood test (FOBT) of the stool. You may have this test every year starting at age 44. Flexible sigmoidoscopy or colonoscopy. You may have a sigmoidoscopy every 5 years or a colonoscopy every 10 years starting at age 36. Hepatitis C blood test. Hepatitis B blood test. Sexually transmitted disease (STD) testing. Diabetes screening. This is done by checking your blood sugar (glucose) after you have not eaten for a while (fasting). You may have this done every 1-3 years. Bone density scan. This is done to screen for osteoporosis. You may have this done starting at age 68. Mammogram. This may be done every 1-2 years. Talk to your health care provider about how often you should have regular mammograms. Talk with your health care provider about your test results, treatment options, and if necessary, the need for more tests. Vaccines  Your health care provider may recommend certain vaccines, such as: Influenza vaccine. This is recommended every year. Tetanus, diphtheria, and acellular pertussis (Tdap, Td) vaccine. You may need a Td booster every 10 years. Zoster vaccine. You may need this after age 68. Pneumococcal 13-valent conjugate (PCV13) vaccine. One dose is recommended after age 68. Pneumococcal polysaccharide (PPSV23) vaccine. One dose is recommended after age 68. Talk to your health care provider about which screenings and vaccines you need and how often you need them. This information is not intended to  replace advice given to you by your health care provider. Make sure you discuss any questions you have with your health care provider. Document Released: 07/07/2015 Document Revised: 02/28/2016 Document Reviewed: 04/11/2015 Elsevier Interactive Patient Education  2017 [employer].  Fall Prevention in the Home Falls can cause injuries. They can happen to people of all ages. There are many things you can do to make your home safe and to help prevent falls. What can I do on the outside of my home? Regularly fix the edges of walkways and driveways and fix any cracks. Remove anything that might make you trip as you walk through a door, such as a raised step or threshold. Trim any bushes or trees on the path to your home. Use bright outdoor lighting. Clear any walking paths of anything that might make someone trip, such as rocks or tools. Regularly check to see if handrails are loose or broken. Make sure that both sides of any steps have handrails. Any raised decks and porches should have guardrails on the edges. Have any leaves, snow, or ice cleared  regularly. Use sand or salt on walking paths during winter. Clean up any spills in your garage right away. This includes oil or grease spills. What can I do in the bathroom? Use night lights. Install grab bars by the toilet and in the tub and shower. Do not use towel bars as grab bars. Use non-skid mats or decals in the tub or shower. If you need to sit down in the shower, use a plastic, non-slip stool. Keep the floor dry. Clean up any water that spills on the floor as soon as it happens. Remove soap buildup in the tub or shower regularly. Attach bath mats securely with double-sided non-slip rug tape. Do not have throw rugs and other things on the floor that can make you trip. What can I do in the bedroom? Use night lights. Make sure that you have a light by your bed that is easy to reach. Do not use any sheets or blankets that are too big for  your bed. They should not hang down onto the floor. Have a firm chair that has side arms. You can use this for support while you get dressed. Do not have throw rugs and other things on the floor that can make you trip. What can I do in the kitchen? Clean up any spills right away. Avoid walking on wet floors. Keep items that you use a lot in easy-to-reach places. If you need to reach something above you, use a strong step stool that has a grab bar. Keep electrical cords out of the way. Do not use floor polish or wax that makes floors slippery. If you must use wax, use non-skid floor wax. Do not have throw rugs and other things on the floor that can make you trip. What can I do with my stairs? Do not leave any items on the stairs. Make sure that there are handrails on both sides of the stairs and use them. Fix handrails that are broken or loose. Make sure that handrails are as long as the stairways. Check any carpeting to make sure that it is firmly attached to the stairs. Fix any carpet that is loose or worn. Avoid having throw rugs at the top or bottom of the stairs. If you do have throw rugs, attach them to the floor with carpet tape. Make sure that you have a light switch at the top of the stairs and the bottom of the stairs. If you do not have them, ask someone to add them for you. What else can I do to help prevent falls? Wear shoes that: Do not have high heels. Have rubber bottoms. Are comfortable and fit you well. Are closed at the toe. Do not wear sandals. If you use a stepladder: Make sure that it is fully opened. Do not climb a closed stepladder. Make sure that both sides of the stepladder are locked into place. Ask someone to hold it for you, if possible. Clearly mark and make sure that you can see: Any grab bars or handrails. First and last steps. Where the edge of each step is. Use tools that help you move around (mobility aids) if they are needed. These  include: Canes. Walkers. Scooters. Crutches. Turn on the lights when you go into a dark area. Replace any light bulbs as soon as they burn out. Set up your furniture so you have a clear path. Avoid moving your furniture around. If any of your floors are uneven, fix them. If there are any pets around  you, be aware of where they are. Review your medicines with your doctor. Some medicines can make you feel dizzy. This can increase your chance of falling. Ask your doctor what other things that you can do to help prevent falls. This information is not intended to replace advice given to you by your health care provider. Make sure you discuss any questions you have with your health care provider. Document Released: 04/06/2009 Document Revised: 11/16/2015 Document Reviewed: 07/15/2014 Elsevier Interactive Patient Education  2017 [employer].

## 2023-01-10 ENCOUNTER — Ambulatory Visit (INDEPENDENT_AMBULATORY_CARE_PROVIDER_SITE_OTHER): Payer: Medicare Other

## 2023-01-10 VITALS — Ht 68.0 in | Wt 167.0 lb

## 2023-01-10 DIAGNOSIS — Z0000 Encounter for general adult medical examination without abnormal findings: Secondary | ICD-10-CM

## 2023-01-10 DIAGNOSIS — Z780 Asymptomatic menopausal state: Secondary | ICD-10-CM

## 2023-01-13 DIAGNOSIS — I2510 Atherosclerotic heart disease of native coronary artery without angina pectoris: Secondary | ICD-10-CM | POA: Diagnosis not present

## 2023-01-13 DIAGNOSIS — I10 Essential (primary) hypertension: Secondary | ICD-10-CM | POA: Diagnosis not present

## 2023-01-13 DIAGNOSIS — F329 Major depressive disorder, single episode, unspecified: Secondary | ICD-10-CM | POA: Diagnosis not present

## 2023-01-13 DIAGNOSIS — G8929 Other chronic pain: Secondary | ICD-10-CM | POA: Diagnosis not present

## 2023-01-13 DIAGNOSIS — M48062 Spinal stenosis, lumbar region with neurogenic claudication: Secondary | ICD-10-CM | POA: Diagnosis not present

## 2023-01-13 DIAGNOSIS — E1142 Type 2 diabetes mellitus with diabetic polyneuropathy: Secondary | ICD-10-CM | POA: Diagnosis not present

## 2023-01-13 NOTE — Telephone Encounter (Addendum)
Ladona Ridgel SLP calls nurse line to discuss worsening cognitive decline.   She reports she gave the patient a SLUMS cognitive screen today and the patient scored 17/30. She reports the patient reported multiple falls over the last several months that have gone unreported. She reported she can not remember the names of her family members at times.   I called patient to schedule with PCP for 7/29. However, I had to LVM.   Will await her return call.

## 2023-01-16 DIAGNOSIS — E1142 Type 2 diabetes mellitus with diabetic polyneuropathy: Secondary | ICD-10-CM | POA: Diagnosis not present

## 2023-01-16 DIAGNOSIS — G8929 Other chronic pain: Secondary | ICD-10-CM | POA: Diagnosis not present

## 2023-01-16 DIAGNOSIS — I10 Essential (primary) hypertension: Secondary | ICD-10-CM | POA: Diagnosis not present

## 2023-01-16 DIAGNOSIS — I2510 Atherosclerotic heart disease of native coronary artery without angina pectoris: Secondary | ICD-10-CM | POA: Diagnosis not present

## 2023-01-16 DIAGNOSIS — M48062 Spinal stenosis, lumbar region with neurogenic claudication: Secondary | ICD-10-CM | POA: Diagnosis not present

## 2023-01-16 DIAGNOSIS — F329 Major depressive disorder, single episode, unspecified: Secondary | ICD-10-CM | POA: Diagnosis not present

## 2023-01-16 NOTE — Telephone Encounter (Signed)
Patients husband returns call to nurse line.   Patient scheduled for Monday with PCP.

## 2023-01-20 ENCOUNTER — Encounter: Payer: Self-pay | Admitting: Student

## 2023-01-20 ENCOUNTER — Other Ambulatory Visit: Payer: Self-pay

## 2023-01-20 ENCOUNTER — Ambulatory Visit (INDEPENDENT_AMBULATORY_CARE_PROVIDER_SITE_OTHER): Payer: Medicare Other | Admitting: Student

## 2023-01-20 VITALS — BP 125/50 | HR 71 | Ht 67.0 in | Wt 167.0 lb

## 2023-01-20 DIAGNOSIS — E1140 Type 2 diabetes mellitus with diabetic neuropathy, unspecified: Secondary | ICD-10-CM | POA: Diagnosis not present

## 2023-01-20 DIAGNOSIS — R413 Other amnesia: Secondary | ICD-10-CM | POA: Diagnosis not present

## 2023-01-20 DIAGNOSIS — D509 Iron deficiency anemia, unspecified: Secondary | ICD-10-CM

## 2023-01-20 NOTE — Progress Notes (Unsigned)
SUBJECTIVE:   CHIEF COMPLAINT / HPI:   I have taken over as this patient's PCP from Dr. Robyne Peers, who discussed mood on last check and patient was doing well on Zoloft and Wellbutrin.   Memory Concerns:  Speech called the nurse line due to worsening cognitive decline of the patient on 01/08/2023.  She gave the patient a slums cognitive screen and the patient scored a 17 out of 30 she reported the patient had multiple falls over the last several months that had gone unreported and could not remember the names of her family members at times.  The patient is requesting neurology referral.   Not nearly as talkative, loses train of thought after a few words into conversation   Cannot describe items, word finding difficulty.   Able to drive and has no problem remembering directions.  Has no problem with familiar faces.  This is a chronic problem and has been present, worsening over the last 6 months.  History provided by daughter and patient  She has been having difficulty remembering her medications and when to take them is unsure about taking her Eliquis as instructed.  Balance Difficulty: Patient has also had balance difficulty, was seen by neurology in 2022 for this and had brain imaging at that time that was overall unremarkable.  She notes that she seems to veer to the left when walking.  She also has had multiple undocumented falls, most recent fall, 2 weeks ago.  PERTINENT  PMH / PSH:  Coronary artery disease Hypertension Paroxysmal A-fib obesity with sleep apnea -- Eliquis  GERD short-bowel syndrome h/o bariatric surgery  Hyperparathyroidism Hypothyroidism Hyperlipidemia Parathyroid adenoma Peripheral neuropathy Age-related cognitive decline B12 deficiency Pacemaker? Major depression Vitamin D deficiency   Patient Care Team: Alfredo Martinez, MD as PCP - General (Family Medicine) Lanier Prude, MD as PCP - Electrophysiology (Cardiology) Linna Darner, RD as  Dietitian (Family Medicine) Fredrich Birks, OD as Referring Physician (Optometry) Everette Rank, MD as Attending Physician (Bariatrics) August Saucer, Corrie Mckusick, MD as Consulting Physician (Orthopedic Surgery) Jeralyn Ruths, MD as Consulting Physician (Oncology) OBJECTIVE:  BP (!) 125/50   Pulse 71   Ht 5\' 7"  (1.702 m)   Wt 167 lb (75.8 kg)   SpO2 100%   BMI 26.16 kg/m  Physical Exam  General: Alert in no apparent distress; pleasant with daughter at bedside  Heart: Regular rate and rhythm with no murmurs appreciated Lungs: CTA bilaterally, no wheezing Abdomen: no abdominal pain Skin: Warm and dry Neuro: CN II: PERRL CN III, IV,VI: EOMI CV V: Normal sensation in V1, V2, V3 CVII: Symmetric smile and brow raise CN VIII: Normal hearing CN IX,X: Symmetric palate raise  CN XI: 5/5 shoulder shrug CN XII: Symmetric tongue protrusion  UE and LE strength 5/5 Normal sensation in UE and LE bilaterally  Leftward deviation with gait, no foot drop, no shuffling Psych: Word finding difficulty, tangential    ASSESSMENT/PLAN:  Memory loss Assessment & Plan: This patient has complained of memory loss for some time but notes worsening symptoms over the last 6 months.  Head imaging to this point overall unremarkable and reassuring.  No overtly concerning findings on neurologic examination, no evidence of stroke on physical exam.  Will assess again for reversible causes with TSH and B12, no high risk for HIV or RPR so we will defer these.  Reasonable for her to see neurology again as she has been established with them in the past.  I also recommend that she  return for Geriatric Clinic to assess cognitive declines and ambulation as she seems to be a high fall risk. Return for geriatric clinic.   Orders: -     TSH Rfx on Abnormal to Free T4 -     Vitamin B12 -     Ambulatory referral to Neurology  Iron deficiency anemia, unspecified iron deficiency anemia type Assessment & Plan: History of,  obtain UTD CBC  Orders: -     CBC with Differential/Platelet  Type 2 diabetes, controlled, with neuropathy (HCC) Assessment & Plan: Check BMP today  Orders: -     Basic metabolic panel; Future  Discussed patient with Dr. Raymondo Band after she left her visit, consider Ozempic (semaglutide) lower dose at future visit.  Patient can take fewer clicks on the current pen.      Return for Geriatric Clinic . Alfredo Martinez, MD 01/21/2023, 7:42 AM PGY-3, [facility]

## 2023-01-20 NOTE — Patient Instructions (Addendum)
It was great to see you today! Thank you for choosing [facility] for your primary care. [name] was seen for follow up.  Today we addressed: I have ordered some labs to do today We will refer to neurology I will have you set up with geriatrics   If you haven't already, sign up for My Chart to have easy access to your labs results, and communication with your primary care physician.  We are checking some labs today. If they are abnormal, I will call you. If they are normal, I will send you a MyChart message (if it is active) or a letter in the mail. If you do not hear about your labs in the next 2 weeks, please call the office. I recommend that you always bring your medications to each appointment as this makes it easy to ensure you are on the correct medications and helps [place] not miss refills when you need them. Call the clinic at [phone] if your symptoms worsen or you have any concerns.  You should return to our clinic Return for Geriatric Clinic . Please arrive 15 minutes before your appointment to ensure smooth check in process.  We appreciate your efforts in making this happen.  Thank you for allowing me to participate in your care, Alfredo Martinez, MD 01/20/2023, 2:41 PM PGY-3, [facility]

## 2023-01-21 ENCOUNTER — Encounter: Payer: Self-pay | Admitting: Student

## 2023-01-21 ENCOUNTER — Other Ambulatory Visit: Payer: Self-pay | Admitting: Student

## 2023-01-21 DIAGNOSIS — E875 Hyperkalemia: Secondary | ICD-10-CM

## 2023-01-21 NOTE — Assessment & Plan Note (Signed)
This patient has complained of memory loss for some time but notes worsening symptoms over the last 6 months.  Head imaging to this point overall unremarkable and reassuring.  No overtly concerning findings on neurologic examination, no evidence of stroke on physical exam.  Will assess again for reversible causes with TSH and B12, no high risk for HIV or RPR so we will defer these.  Reasonable for her to see neurology again as she has been established with them in the past.  I also recommend that she return for Geriatric Clinic to assess cognitive declines and ambulation as she seems to be a high fall risk. Return for geriatric clinic.

## 2023-01-21 NOTE — Assessment & Plan Note (Signed)
History of, obtain UTD CBC

## 2023-01-21 NOTE — Assessment & Plan Note (Signed)
Check BMP today 

## 2023-01-22 ENCOUNTER — Other Ambulatory Visit: Payer: Medicare Other

## 2023-01-22 DIAGNOSIS — E875 Hyperkalemia: Secondary | ICD-10-CM | POA: Diagnosis not present

## 2023-01-23 ENCOUNTER — Encounter: Payer: Self-pay | Admitting: Orthopedic Surgery

## 2023-01-23 DIAGNOSIS — I10 Essential (primary) hypertension: Secondary | ICD-10-CM | POA: Diagnosis not present

## 2023-01-23 DIAGNOSIS — F329 Major depressive disorder, single episode, unspecified: Secondary | ICD-10-CM | POA: Diagnosis not present

## 2023-01-23 DIAGNOSIS — G8929 Other chronic pain: Secondary | ICD-10-CM | POA: Diagnosis not present

## 2023-01-23 DIAGNOSIS — M48062 Spinal stenosis, lumbar region with neurogenic claudication: Secondary | ICD-10-CM | POA: Diagnosis not present

## 2023-01-23 DIAGNOSIS — E1142 Type 2 diabetes mellitus with diabetic polyneuropathy: Secondary | ICD-10-CM | POA: Diagnosis not present

## 2023-01-23 DIAGNOSIS — I2510 Atherosclerotic heart disease of native coronary artery without angina pectoris: Secondary | ICD-10-CM | POA: Diagnosis not present

## 2023-01-30 ENCOUNTER — Other Ambulatory Visit (INDEPENDENT_AMBULATORY_CARE_PROVIDER_SITE_OTHER): Payer: Medicare Other

## 2023-01-30 ENCOUNTER — Encounter: Payer: Self-pay | Admitting: Orthopedic Surgery

## 2023-01-30 ENCOUNTER — Ambulatory Visit (INDEPENDENT_AMBULATORY_CARE_PROVIDER_SITE_OTHER): Payer: Medicare Other | Admitting: Orthopedic Surgery

## 2023-01-30 DIAGNOSIS — B351 Tinea unguium: Secondary | ICD-10-CM

## 2023-01-30 DIAGNOSIS — M79672 Pain in left foot: Secondary | ICD-10-CM

## 2023-01-30 NOTE — Progress Notes (Signed)
Office Visit Note   Patient: [name]           Date of Birth: 11/09/1955           MRN: [id number] Visit Date: 01/30/2023              Requested by: Alfredo Martinez, MD [address] [place],  [place] [address] PCP: Alfredo Martinez, MD  Chief Complaint  Patient presents with   Left Foot - Pain      HPI: Patient is a 67 year old woman who presents with pain lateral aspect of her left foot.  Patient also has painful hyperkeratotic calluses and painful onychomycotic nails left foot.  Assessment & Plan: Visit Diagnoses:  1. Pain in left foot   2. Onychomycosis     Plan: The nails were trimmed x 5 calluses pared x 1.  Recommended Hoka sneakers to unload the pressure points on her foot.  Follow-Up Instructions: No follow-ups on file.   Ortho Exam  Patient is alert, oriented, no adenopathy, well-dressed, normal affect, normal respiratory effort. Examination of the left foot she has a good dorsalis pedis pulse.  She has good dorsiflexion of the ankle her foot has fat atrophy with essentially skin and bone and tenderness to palpation over the base of the fifth metatarsal.  Radiograph shows no fractures.  She has a hypertrophic callus on the heel and this was pared with a 10 blade knife.  The onychomycotic nails were trimmed x 5.  Her foot is atrophied.  No redness no cellulitis no open ulcers.  Imaging: No results found. No images are attached to the encounter.  Labs: Lab Results  Component Value Date   HGBA1C 5.6 10/30/2022   HGBA1C 5.3 10/17/2022   HGBA1C 5.7 06/28/2022   ESRSEDRATE 62 (H) 09/28/2007   ESRSEDRATE 67 (H) 09/21/2007   ESRSEDRATE 86 (H) 09/17/2007   REPTSTATUS 08/07/2020 FINAL 08/04/2020   GRAMSTAIN  01/07/2012    NO WBC SEEN NO SQUAMOUS EPITHELIAL CELLS SEEN NO ORGANISMS SEEN   CULT  08/04/2020    NO GROUP A STREP (S.PYOGENES) ISOLATED Performed at [facility], [address] [place], [place] [address]    [facility] STAPHYLOCOCCUS AUREUS  01/07/2012     Lab Results  Component Value Date   ALBUMIN 3.9 10/30/2022   ALBUMIN 4.5 11/05/2021   ALBUMIN 3.9 02/15/2021   PREALBUMIN 20.4 05/25/2015    Lab Results  Component Value Date   MG 2.1 05/07/2017   MG 2.0 05/25/2015   MG 1.6 (L) 11/10/2013   Lab Results  Component Value Date   VD25OH 21.04 (L) 05/12/2020   VD25OH 20.4 (L) 04/29/2018   VD25OH 14.8 (L) 10/13/2017    Lab Results  Component Value Date   PREALBUMIN 20.4 05/25/2015      Latest Ref Rng & Units 01/20/2023    3:41 PM 11/06/2022    6:34 AM 11/05/2022   12:51 PM  CBC EXTENDED  WBC 3.4 - 10.8 x10E3/uL 5.1  6.3    RBC 3.77 - 5.28 x10E6/uL 3.93  3.28    Hemoglobin 11.1 - 15.9 g/dL 13.2  9.1  9.5   HCT 44.0 - 46.6 % 33.5  28.9  28.0   Platelets 150 - 450 x10E3/uL 206  192    NEUT# 1.4 - 7.0 x10E3/uL 3.3     Lymph# 0.7 - 3.1 x10E3/uL 1.3        There is no height or weight on file to calculate BMI.  Orders:  Orders Placed This Encounter  Procedures   XR Foot Complete Left   No orders of the defined types were placed in this encounter.    Procedures: No procedures performed  Clinical Data: No additional findings.  ROS:  All other systems negative, except as noted in the HPI. Review of Systems  Objective: Vital Signs: There were no vitals taken for this visit.  Specialty Comments:  No specialty comments available.  PMFS History: Patient Active Problem List   Diagnosis Date Noted   Lumbar stenosis with neurogenic claudication 11/05/2022   Callus of foot 06/28/2022   Tremor 11/05/2021   Mood changes 07/17/2021   Neck muscle spasm 03/17/2021   Type 2 DM with diabetic neuropathy affecting both sides of body (HCC) 01/09/2021   Right leg swelling 12/27/2020   Parathyroid adenoma 11/23/2020   Osteoporosis screening 11/16/2020   Memory loss 08/07/2020   Serum calcium elevated 04/12/2020   Hallux rigidus of left foot 04/12/2020   Hyperparathyroidism (HCC) 04/11/2020    Presbycusis of both ears 04/11/2020   No-show for appointment 03/08/2020   Pacemaker    Hypothyroidism 07/19/2019   Hypertension associated with diabetes (HCC) 07/11/2018   Hyperlipidemia associated with type 2 diabetes mellitus (HCC) 07/11/2018   Short bowel syndrome 07/11/2018   Status post left knee replacement 07/11/2018   Major depression in remission (HCC) 07/07/2018   Coronary artery disease involving native coronary artery of native heart without angina pectoris 07/07/2018   Paroxysmal atrial fibrillation (HCC) 05/11/2018   Age-related cognitive decline 04/30/2018   Gastroesophageal reflux disease without esophagitis 04/15/2017   B12 deficiency 12/13/2016   Tubular adenoma of colon 03/09/2016   Vitamin D deficiency 03/09/2016   S/P bariatric surgery-duodenal switch with sleeve gastrectomy 12/04/2013   Morbid obesity (HCC) 08/26/2013   Iron deficiency anemia 06/19/2012   Low back pain 03/05/2012   PSORIASIS 05/28/2010   Peripheral neuropathy 01/12/2009   Type 2 diabetes, controlled, with neuropathy (HCC) 02/18/2008   Obesity with sleep apnea 06/12/2007   Past Medical History:  Diagnosis Date   Advanced directives, counseling/discussion 04/12/2020   Allergy    Anemia    ANEMIA, PERNICIOUS, HX OF 05/13/2007   Anxiety    Arthritis    ASYMPTOMATIC POSTMENOPAUSAL STATUS 02/18/2008   B12 deficiency 12/13/2016   BACK PAIN, LUMBAR 11/19/2007   Blood in urine 07/19/2019   Bowel incontinence    Cataract    Cellulitis of left lower leg 10/28/2013   Cellulitis of leg, right 10/11/2010   Chronic depression 10/06/2007   Chronic dermatitis of hands 05/02/2009   Coronary artery disease involving native coronary artery of native heart without angina pectoris 07/07/2018   Last Assessment & Plan:  Formatting of this note might be different from the original. The patient reports remote cardiac catheterization with up to 40% plaque.  Subsequent Cardiolite stress test in October 2019 did  not show any evidence of active ischemia or prior infarction. Formatting of this note might be different from the original. medical  Last Assessment & Plan:  Formatting of this note mi   DEPRESSION, CHRONIC 10/06/2007   Diabetes mellitus without complication (HCC)    DIABETES MELLITUS, WITH NEUROLOGICAL COMPLICATIONS 02/18/2008   Diabetic neuropathy (HCC)    DYSLIPIDEMIA 05/13/2007   Dyslipidemia 04/15/2017   Last Assessment & Plan:  The patient will continue atorvastatin for her dyslipidemia.   Dyslipidemia associated with type 2 diabetes mellitus (HCC) 07/11/2018   Essential hypertension 03/20/2010   Family history of colon cancer  Fecal incontinence 08/02/2015   Frozen shoulder    Lt   Full dentures    Gastroesophageal reflux disease without esophagitis 04/15/2017   Generalized abdominal pain 02/05/2018   Genetic testing 11/24/2017   Negative genetic testing on the common hereditary cancer panel.  The Hereditary Gene Panel offered by [facility] includes sequencing and/or deletion duplication testing of the following 47 genes: APC, ATM, AXIN2, BARD1, BMPR1A, BRCA1, BRCA2, BRIP1, CDH1, CDK4, CDKN2A (p14ARF), CDKN2A (p16INK4a), CHEK2, CTNNA1, DICER1, EPCAM (Deletion/duplication testing only), GREM1 (promoter region deletion/duplicat   GERD (gastroesophageal reflux disease)    GI bleed 03/15/2016   GOITER, MULTINODULAR 05/13/2007   GOITER, MULTINODULAR 05/13/2007    (last TSH 3.26; US soft tissue neck in 09/2004 showed multinodular goiter with specific small nodule for which was recommended to be followed by repeat US in 3-6 months 12/2016: Thyroid US: recommend repeat in 1 year (one nodule 1.7cm on left thyroid)    Headache    History of blood transfusion    Hx of colonic polyps 12/04/2010   HYPERCHOLESTEROLEMIA 03/20/2010   Hyperparathyroidism (HCC) 04/11/2020   HYPERTENSION 03/20/2010   Hypertension associated with diabetes (HCC) 07/11/2018   Iron deficiency anemia 06/19/2012   Low  back pain 03/05/2012   Completed PT-notes say very motivated and had good progress.   CT (03/2020: Mild curvature convex to the right in the upper lumbar region into left in lower lumbar region. No antero or retrolisthesis in the supine position. retrolisthesis at L3-4 of 3 mm with flexion which increases to 6 mm with neutral and extension. Multifactorial spinal stenosis at L3-4 due to circumferential protrusion of the    Lower back pain    Major depression in remission (HCC) 07/07/2018   Last Assessment & Plan:  Mood has seemed good on sertraline.   Malabsorption 03/12/2016   NASH (nonalcoholic steatohepatitis)    Nausea 07/19/2019   Need for immunization against influenza 04/12/2020   No-show for appointment 03/08/2020   Nonobstructive CAD  08/26/2013   The patient has prior cardiac workup in 2006 when she underwent a stress test for abnormal EKG. The stress test was nonconclusive and she underwent cardiac catheterization that showed 30% stenosis in the LAD in 25% stenosis in RCA she was treated medically.     OA (osteoarthritis)    OBSTRUCTIVE SLEEP APNEA 06/12/2007   uses CPAP.   Pacemaker    Paroxysmal atrial fibrillation (HCC) 05/11/2018   Last Assessment & Plan:  Patient was noted to have episodes of paroxysmal atrial fibrillation that was predominantly rate controlled and at a relatively low burden.  She is chronically on anticoagulation now with Eliquis.  She does not take any negative chronotropic agents with her sinus bradycardia.  If she has any breakthrough more prolonged tachycardic episodes and requires negative chronotropi   Peripheral autonomic neuropathy due to diabetes mellitus (HCC) 07/11/2018   PERIPHERAL NEUROPATHY 01/12/2009   Peripheral neuropathy 01/12/2009   Peripheral vascular disease (HCC)    Presbycusis of both ears 04/11/2020   Primary osteoarthritis involving multiple joints 07/07/2018   Last Assessment & Plan:  Post op left tka and here for rehab as her husband  is handicapped so she has limited help at home. Consitpation noted, some pain overnight, better with a pillow under her knee.   Primary osteoarthritis of left knee 04/23/2011   Previously seen by Dr. Dorene Grebe. Will plan on repeat referral after trial injection.      PSORIASIS 05/28/2010   Rectal prolapse 05/24/2015   Rectovaginal  fistula 05/25/2014   Renal insufficiency    stage 1 kd   S/P bariatric surgery-duodenal switch with sleeve gastrectomy 12/04/2013   S/P total knee arthroplasty, right 11/16/2019   Screening mammogram, encounter for 07/12/2019   Second degree AV block 02/08/2020   Serum calcium elevated 04/12/2020   Short bowel syndrome 07/11/2018   Spigelian hernia 02/05/2018   Status post left knee replacement 07/11/2018   Stress 05/24/2015   Surgical counseling visit 10/02/2019   Tubular adenoma of colon 03/09/2016   Colonoscopy in 10/2015: 3  tubular adenomas; GI recommends repeat colonoscopy in 3 years Colonoscopy August 2012: 3 tubular adenomas.    Type 2 diabetes, controlled, with neuropathy (HCC) 02/18/2008   Umbilical hernia without obstruction and without gangrene 04/15/2017   Unilateral primary osteoarthritis, left knee    UNSPECIFIED VENOUS INSUFFICIENCY 05/28/2010   Patient with frequent ulceration related to venous stasis-seen at wound care. Edema and Venous stasis changes due to this . Echo 04/16/11 with EF 60%. No signs diastolic dysfunction.        Vaccination against Streptococcus pneumoniae within past 5 years 04/12/2020   Vaginal irritation 07/19/2019   Venous stasis dermatitis of right lower extremity 01/02/2018   Vitamin D deficiency 03/09/2016   Wears glasses     Family History  Problem Relation Age of Onset   Hyperlipidemia Father    Hypertension Father    Cirrhosis Father    Colon cancer Father 13       d. 75   Colon cancer Sister 21       d. 70   Bipolar disorder Sister    Aneurysm Mother        brain   Cerebral aneurysm Mother    Colon  cancer Sister 39       d. 51   Bipolar disorder Sister    Cancer Paternal Aunt        NOS, ? colon   Congestive Heart Failure Maternal Grandmother    Drug abuse Neg Hx    CAD Neg Hx    Stomach cancer Neg Hx    Hyperparathyroidism Neg Hx    Hypercalcemia Neg Hx     Past Surgical History:  Procedure Laterality Date   CARDIAC CATHETERIZATION  2006   CATARACT EXTRACTION W/ INTRAOCULAR LENS  IMPLANT, BILATERAL     COLONOSCOPY     DECOMPRESSIVE LUMBAR LAMINECTOMY LEVEL 2 N/A 11/05/2022   Procedure: L3-4, L4-5 DECOMPRESSIVE LUMBAR LAMINECTOMY LEVEL 2;  Surgeon: London Sheer, MD;  Location: [facility] OR;  Service: Orthopedics;  Laterality: N/A;   ELECTROCARDIOGRAM  04/16/2006   EXAMINATION UNDER ANESTHESIA N/A 06/29/2014   Procedure: EXAM UNDER ANESTHESIA;  Surgeon: Sherian Rein, MD;  Location: [facility] ORS;  Service: Gynecology;  Laterality: N/A;   Exercise myoview  01/24/2005   FLEXIBLE SIGMOIDOSCOPY     GASTRIC BYPASS  11/09/2013   JOINT REPLACEMENT     KNEE ARTHROSCOPY  2003   right   LAPAROSCOPY N/A 03/12/2016   Procedure: LAPAROSCOPIC ANASTOMOSIS OF INTESTINE (ENTEROENTEROSTOMY);  Surgeon: Everette Rank, MD;  Location: [facility] ORS;  Service: General;  Laterality: N/A;   LEG SURGERY Left    metal and pins in lower left leg   mrsa Right    arm   MULTIPLE TOOTH EXTRACTIONS     PACEMAKER LEADLESS INSERTION N/A 02/08/2020   Procedure: PACEMAKER LEADLESS INSERTION;  Surgeon: Hillis Range, MD;  Location: [facility];  Service: Cardiovascular;  Laterality: N/A;   PARATHYROIDECTOMY Left 02/19/2021  Procedure: LEFT PARATHYROIDECTOMY WITH FROZEN SECTION;  Surgeon: Serena Colonel, MD;  Location: [facility] OR;  Service: ENT;  Laterality: Left;   SHOULDER ARTHROSCOPY W/ ROTATOR CUFF REPAIR Right    stab phlebectomy  Right 02/10/2019   stab phlebectomy > 20 incisions right leg by Fabienne Bruns MD    TONSILLECTOMY     TOTAL KNEE ARTHROPLASTY Left 06/30/2018   Procedure: LEFT TOTAL KNEE  ARTHROPLASTY;  Surgeon: Cammy Copa, MD;  Location: [facility] OR;  Service: Orthopedics;  Laterality: Left;   TOTAL KNEE ARTHROPLASTY Right 11/16/2019   Procedure: RIGHT TOTAL KNEE ARTHROPLASTY-CEMENTED;  Surgeon: Cammy Copa, MD;  Location: [facility] OR;  Service: Orthopedics;  Laterality: Right;   VARICOSE VEIN SURGERY     Remotef   Social History   Occupational History   Occupation: Unemployed    Comment: disabled  Tobacco Use   Smoking status: Former    Current packs/day: 0.00    Average packs/day: 2.0 packs/day for 24.1 years (48.2 ttl pk-yrs)    Types: Cigarettes    Start date: 06/24/1970    Quit date: 08/06/1994    Years since quitting: 28.5   Smokeless tobacco: Never  Vaping Use   Vaping status: Never Used  Substance and Sexual Activity   Alcohol use: No    Alcohol/week: 0.0 standard drinks of alcohol   Drug use: No   Sexual activity: Not Currently

## 2023-01-31 ENCOUNTER — Telehealth: Payer: Self-pay

## 2023-01-31 NOTE — Telephone Encounter (Signed)
Patient's husband LVM on nurse line regarding recent lab results.   Advised of message per Dr. Melissa Noon.   Husband has further questions regarding patient's memory loss. Advised that message had been sent to geriatric provider, Dr. McDiarmid in regards to having her scheduled in our geriatric clinic.    No further questions at this time.   Veronda Prude, RN

## 2023-01-31 NOTE — Telephone Encounter (Signed)
-----   Message from Vernon W sent at 01/20/2023  2:55 PM EDT ----- Patient is needing a Geri appointment.   Thanks!

## 2023-01-31 NOTE — Telephone Encounter (Signed)
Dr. McDiarmid is looking into seeing if patient needs a Geri appt. Aquilla Solian, CMA

## 2023-02-03 ENCOUNTER — Other Ambulatory Visit: Payer: Self-pay | Admitting: Student

## 2023-02-03 DIAGNOSIS — R413 Other amnesia: Secondary | ICD-10-CM

## 2023-02-03 DIAGNOSIS — I10 Essential (primary) hypertension: Secondary | ICD-10-CM | POA: Diagnosis not present

## 2023-02-03 DIAGNOSIS — M48062 Spinal stenosis, lumbar region with neurogenic claudication: Secondary | ICD-10-CM | POA: Diagnosis not present

## 2023-02-03 DIAGNOSIS — I2510 Atherosclerotic heart disease of native coronary artery without angina pectoris: Secondary | ICD-10-CM | POA: Diagnosis not present

## 2023-02-03 DIAGNOSIS — E1142 Type 2 diabetes mellitus with diabetic polyneuropathy: Secondary | ICD-10-CM | POA: Diagnosis not present

## 2023-02-03 DIAGNOSIS — G8929 Other chronic pain: Secondary | ICD-10-CM | POA: Diagnosis not present

## 2023-02-03 DIAGNOSIS — F329 Major depressive disorder, single episode, unspecified: Secondary | ICD-10-CM | POA: Diagnosis not present

## 2023-02-05 ENCOUNTER — Ambulatory Visit (INDEPENDENT_AMBULATORY_CARE_PROVIDER_SITE_OTHER): Payer: Medicare Other | Admitting: Orthopedic Surgery

## 2023-02-05 DIAGNOSIS — Z9889 Other specified postprocedural states: Secondary | ICD-10-CM

## 2023-02-05 NOTE — Progress Notes (Addendum)
Orthopedic Surgery Post-operative Office Visit   Procedure: L3-5 lumbar laminectomies and foraminotomies Date of Surgery: 11/05/2022 (~12 weeks post-op)   Assessment: Patient is a 67 y.o. who is doing well after surgery     Plan: -Operative plans complete -Out of bed as tolerated, no brace -No spine specific precautions -Okay to submerge wound -Pain management: OTC medications -Return to office in 3 months, lumbar x-rays needed at next visit: None  Patient has a foot drop on the right that has not improved. She has difficulty clearing the ground and stairs that result in imbalance and she has had falls as a result. Accordingly, I recommended an AFO to help keep her foot elevated to allow her to clear the ground and prevent falls.    ___________________________________________________________________________     Subjective: Patient has been doing well since she was last seen.  She is not having any back pain.  She has mild pain radiating to the anterior aspect of her right leg.  She does not feel this on a daily basis.  She is not taking any medication for this pain.  She is overall pleased with her surgery at this time.     Objective:   General: no acute distress, appropriate affect Neurologic: alert, answering questions appropriately, following commands Respiratory: unlabored breathing on room air Skin: incision is well healed   MSK (spine):   -Strength exam                                                   Left                  Right   EHL                              4/5                  3/5 TA                                 5/5                  3/5 GSC                             5/5                  5/5 Knee extension            5/5                  5/5 Hip flexion                    5/5                  5/5   -Sensory exam                           Sensation intact to light touch in L3-S1 nerve distributions of bilateral lower extremities   Imaging: XRs of the  lumbar spine from 12/25/2022 were previously independently reviewed and interpreted, showing retrolisthesis of L3 on L4. Disc height loss  at multiple levels. Lumbar scoliosis with apex to the right at L3. No fracture or dislocation. No evidence of instability on flexion/extension. Laminectomy defect seen from L3-5.     Patient name: [name] Patient MRN: [id number] Date of visit: 02/05/23

## 2023-02-06 ENCOUNTER — Ambulatory Visit (INDEPENDENT_AMBULATORY_CARE_PROVIDER_SITE_OTHER): Payer: Medicare Other

## 2023-02-06 DIAGNOSIS — I441 Atrioventricular block, second degree: Secondary | ICD-10-CM

## 2023-02-06 LAB — CUP PACEART REMOTE DEVICE CHECK
Battery Remaining Longevity: 96 mo
Battery Voltage: 3.02 V
Brady Statistic RV Percent Paced: 3.98 %
Date Time Interrogation Session: 20240815091700
Implantable Pulse Generator Implant Date: 20210817
Lead Channel Impedance Value: 420 Ohm
Lead Channel Pacing Threshold Amplitude: 0.375 V
Lead Channel Pacing Threshold Pulse Width: 0.4 ms
Lead Channel Sensing Intrinsic Amplitude: 16.875 mV
Lead Channel Setting Pacing Amplitude: 1 V
Lead Channel Setting Pacing Pulse Width: 0.4 ms
Lead Channel Setting Sensing Sensitivity: 2 mV

## 2023-02-08 DIAGNOSIS — K5900 Constipation, unspecified: Secondary | ICD-10-CM | POA: Diagnosis not present

## 2023-02-08 DIAGNOSIS — I2510 Atherosclerotic heart disease of native coronary artery without angina pectoris: Secondary | ICD-10-CM | POA: Diagnosis not present

## 2023-02-08 DIAGNOSIS — M1990 Unspecified osteoarthritis, unspecified site: Secondary | ICD-10-CM | POA: Diagnosis not present

## 2023-02-08 DIAGNOSIS — E785 Hyperlipidemia, unspecified: Secondary | ICD-10-CM | POA: Diagnosis not present

## 2023-02-08 DIAGNOSIS — E1142 Type 2 diabetes mellitus with diabetic polyneuropathy: Secondary | ICD-10-CM | POA: Diagnosis not present

## 2023-02-08 DIAGNOSIS — Z9181 History of falling: Secondary | ICD-10-CM | POA: Diagnosis not present

## 2023-02-08 DIAGNOSIS — Z85038 Personal history of other malignant neoplasm of large intestine: Secondary | ICD-10-CM | POA: Diagnosis not present

## 2023-02-08 DIAGNOSIS — G8929 Other chronic pain: Secondary | ICD-10-CM | POA: Diagnosis not present

## 2023-02-08 DIAGNOSIS — D649 Anemia, unspecified: Secondary | ICD-10-CM | POA: Diagnosis not present

## 2023-02-08 DIAGNOSIS — Z556 Problems related to health literacy: Secondary | ICD-10-CM | POA: Diagnosis not present

## 2023-02-08 DIAGNOSIS — E039 Hypothyroidism, unspecified: Secondary | ICD-10-CM | POA: Diagnosis not present

## 2023-02-08 DIAGNOSIS — Z7901 Long term (current) use of anticoagulants: Secondary | ICD-10-CM | POA: Diagnosis not present

## 2023-02-08 DIAGNOSIS — K219 Gastro-esophageal reflux disease without esophagitis: Secondary | ICD-10-CM | POA: Diagnosis not present

## 2023-02-08 DIAGNOSIS — M48062 Spinal stenosis, lumbar region with neurogenic claudication: Secondary | ICD-10-CM | POA: Diagnosis not present

## 2023-02-08 DIAGNOSIS — Z96653 Presence of artificial knee joint, bilateral: Secondary | ICD-10-CM | POA: Diagnosis not present

## 2023-02-08 DIAGNOSIS — Z6827 Body mass index (BMI) 27.0-27.9, adult: Secondary | ICD-10-CM | POA: Diagnosis not present

## 2023-02-08 DIAGNOSIS — I10 Essential (primary) hypertension: Secondary | ICD-10-CM | POA: Diagnosis not present

## 2023-02-08 DIAGNOSIS — F329 Major depressive disorder, single episode, unspecified: Secondary | ICD-10-CM | POA: Diagnosis not present

## 2023-02-08 DIAGNOSIS — G4730 Sleep apnea, unspecified: Secondary | ICD-10-CM | POA: Diagnosis not present

## 2023-02-08 DIAGNOSIS — E213 Hyperparathyroidism, unspecified: Secondary | ICD-10-CM | POA: Diagnosis not present

## 2023-02-10 ENCOUNTER — Ambulatory Visit (INDEPENDENT_AMBULATORY_CARE_PROVIDER_SITE_OTHER): Payer: Medicare Other | Admitting: Neurology

## 2023-02-10 ENCOUNTER — Telehealth: Payer: Self-pay | Admitting: Neurology

## 2023-02-10 ENCOUNTER — Encounter: Payer: Self-pay | Admitting: Oncology

## 2023-02-10 ENCOUNTER — Encounter: Payer: Self-pay | Admitting: Neurology

## 2023-02-10 VITALS — BP 122/69 | HR 63 | Ht 68.0 in | Wt 169.5 lb

## 2023-02-10 DIAGNOSIS — R2689 Other abnormalities of gait and mobility: Secondary | ICD-10-CM | POA: Insufficient documentation

## 2023-02-10 DIAGNOSIS — R4789 Other speech disturbances: Secondary | ICD-10-CM

## 2023-02-10 DIAGNOSIS — E538 Deficiency of other specified B group vitamins: Secondary | ICD-10-CM

## 2023-02-10 DIAGNOSIS — R269 Unspecified abnormalities of gait and mobility: Secondary | ICD-10-CM | POA: Diagnosis not present

## 2023-02-10 DIAGNOSIS — R413 Other amnesia: Secondary | ICD-10-CM | POA: Diagnosis not present

## 2023-02-10 MED ORDER — VITAMIN B-12 3000 MCG SL SUBL: 3000.0000 ug | SUBLINGUAL_TABLET | Freq: Every day | SUBLINGUAL | 11 refills | Status: DC

## 2023-02-10 NOTE — Progress Notes (Signed)
Chief Complaint  Patient presents with   New Patient (Initial Visit)    Rm12, alone Memory loss, moca was:18, mmse:26       ASSESSMENT AND PLAN  Cognitive impairment   Word finding difficulties also reported 1 episode of sudden onset right arm weakness,  MRI of the brain to rule out left hemisphere pathology   Vitamin B12 deficiency  History of bariatric surgery with rapid weight loss  B12 supplement  DIAGNOSTIC DATA (LABS, IMAGING, TESTING) - I reviewed patient records, labs, notes, testing and imaging myself where available.   MEDICAL HISTORY:  [name] is a 67 year old female, seen in request by her primary care physician Dr. Perley Jain, Leighton Roach, for evaluation of memory loss, initial evaluation February 10, 2023  I reviewed and summarized the referring note. PMHX.  HTN DM HLD Depression Pacemaker,  Peripheral vascular disease. Bariatric surgery in May 2015, lost weight from 350  to 169, rapid weight loss History of tobacco abuse  She drove herself to clinic today, lives with her husband who suffered severe MS, disabled, required total care, has 2 children, daughter help her regularly,  She complains of excessive stress, began to notice gradual onset word finding difficulties since 2023, 1 day she could not even recall her daughter's name, also complains of mild gait difficulty contributed to her bilateral knee pain, also reported 1 episode of sudden onset right arm weakness, dropped things from her right hand, intermittent bilateral hands tremor,  She had a history of bariatric surgery in 2015, with rapid excessive weight loss,  She is also under the care of of cardiologist, pacemaker implant by Dr. Johney Frame in August 2021 for bradycardia, also carries a diagnosis of atrial fibrillation, on Eliquis,  Personally reviewed CT head in December 2022, no acute abnormality,  She had MRI lumbar spine January 2024, multilevel degenerative changes, severe spinal stenosis  at L3-4, moderate bilateral foraminal narrowing  Laboratory evaluations vitamin B-12 163, normal TSH, BMP showed creatinine of 1.39, EGFR of 41, CBC showed hemoglobin of 10.6   PHYSICAL EXAM:   Vitals:   02/10/23 1358  BP: 122/69  Pulse: 63  Weight: 169 lb 8 oz (76.9 kg)  Height: 5\' 8"  (1.727 m)     Body mass index is 25.77 kg/m.  PHYSICAL EXAMNIATION:  Gen: NAD, conversant, well nourised, well groomed                     Cardiovascular: Regular rate rhythm, no peripheral edema, warm, nontender. Eyes: Conjunctivae clear without exudates or hemorrhage Neck: Supple, no carotid bruits. Pulmonary: Clear to auscultation bilaterally   NEUROLOGICAL EXAM:  MENTAL STATUS: Speech/cognition: Awake, alert, oriented to history taking and casual conversation    02/10/2023    2:17 PM 09/11/2020    1:24 PM  Montreal Cognitive Assessment   Visuospatial/ Executive (0/5) 2 5  Naming (0/3) 2 2  Attention: Read list of digits (0/2) 1 2  Attention: Read list of letters (0/1) 0 1  Attention: Serial 7 subtraction starting at 100 (0/3) 3 3  Language: Repeat phrase (0/2) 1 1  Language : Fluency (0/1) 0 1  Abstraction (0/2) 2 1  Delayed Recall (0/5) 3 4  Orientation (0/6) 4 6  Total 18 26  Adjusted Score (based on education)  27       02/10/2023    2:11 PM  MMSE - Mini Mental State Exam  Orientation to time 3  Orientation to Place 5  Registration 3  Attention/ Calculation 5  Recall 2  Language- name 2 objects 2  Language- repeat 0  Language- follow 3 step command 3  Language- read & follow direction 1  Write a sentence 1  Copy design 1  Total score 26    CRANIAL NERVES: CN II: Visual fields are full to confrontation. Pupils are round equal and briskly reactive to light. CN III, IV, VI: extraocular movement are normal. No ptosis. CN V: Facial sensation is intact to light touch CN VII: Face is symmetric with normal eye closure  CN VIII: Hearing is normal to causal  conversation. CN IX, X: Phonation is normal. CN XI: Head turning and shoulder shrug are intact  MOTOR: Evidence of bilateral ankle dorsiflexion weakness right worse than left, moderate on the right mild on the left  REFLEXES: Reflexes are1 and symmetric at the biceps, triceps, absent at  knees, and ankles. Plantar responses are flexor.  SENSORY: Length-dependent decreased light touch, vibratory sensation, needle prick,  COORDINATION: There is no trunk or limb dysmetria noted.  GAIT/STANCE: Need push-up to get up from seated position, wide-based, cautious, bilateral foot drop right worse than left  REVIEW OF SYSTEMS:  Full 14 system review of systems performed and notable only for as above All other review of systems were negative.   ALLERGIES: Allergies  Allergen Reactions   Cephalexin Hives and Rash    Denies Airway involvement   Latex Rash   Neomycin-Bacitracin Zn-Polymyx Rash   Tape Rash    Latex Prefers paper tape    HOME MEDICATIONS: Current Outpatient Medications  Medication Sig Dispense Refill   Accu-Chek Softclix Lancets lancets Use as instructed once daily 100 each 0   amLODipine (NORVASC) 10 MG tablet Take 1 tablet (10 mg total) by mouth daily. 90 tablet 3   apixaban (ELIQUIS) 5 MG TABS tablet Take 1 tablet by mouth two times daily.  Must schedule OV for further refills 180 tablet 0   atorvastatin (LIPITOR) 40 MG tablet TAKE 1 TABLET BY MOUTH EVERY DAY 90 tablet 3   blood glucose meter kit and supplies KIT Dispense based on patient and insurance preference. Use up to four times daily as directed. 1 each 0   buPROPion (WELLBUTRIN XL) 150 MG 24 hr tablet Take 1 tablet (150 mg total) by mouth daily. 90 tablet 0   gabapentin (NEURONTIN) 300 MG capsule Take 1 capsule (300 mg total) by mouth at bedtime. 90 capsule 3   glucose blood (ONETOUCH VERIO) test strip 1 each by Other route as needed for other. Use as instructed 100 each 12   lisinopril-hydrochlorothiazide  (ZESTORETIC) 20-25 MG tablet Take 1 tablet by mouth daily. 90 tablet 3   Multiple Vitamins-Minerals (MULTIVITAMIN ADULT) CHEW Once daily 1 tablet 0   Semaglutide, 2 MG/DOSE, 8 MG/3ML SOPN Inject 2 mg into the skin once a week. Once weekly 3 mL 3   sertraline (ZOLOFT) 100 MG tablet TAKE 1 TABLET BY MOUTH EVERY DAY 90 tablet 1   spironolactone (ALDACTONE) 25 MG tablet TAKE 1 TABLET BY MOUTH EVERY DAY 90 tablet 1   Teduglutide, rDNA, 5 MG KIT Inject 5 mg into the skin daily. Gattex     tobramycin (TOBREX) 0.3 % ophthalmic solution See admin instructions. Every eight weeks     No current facility-administered medications for this visit.    PAST MEDICAL HISTORY: Past Medical History:  Diagnosis Date   Advanced directives, counseling/discussion 04/12/2020   Allergy    Anemia    ANEMIA, PERNICIOUS, HX  OF 05/13/2007   Anxiety    Arthritis    ASYMPTOMATIC POSTMENOPAUSAL STATUS 02/18/2008   B12 deficiency 12/13/2016   BACK PAIN, LUMBAR 11/19/2007   Blood in urine 07/19/2019   Bowel incontinence    Cataract    Cellulitis of left lower leg 10/28/2013   Cellulitis of leg, right 10/11/2010   Chronic depression 10/06/2007   Chronic dermatitis of hands 05/02/2009   Coronary artery disease involving native coronary artery of native heart without angina pectoris 07/07/2018   Last Assessment & Plan:  Formatting of this note might be different from the original. The patient reports remote cardiac catheterization with up to 40% plaque.  Subsequent Cardiolite stress test in October 2019 did not show any evidence of active ischemia or prior infarction. Formatting of this note might be different from the original. medical  Last Assessment & Plan:  Formatting of this note mi   DEPRESSION, CHRONIC 10/06/2007   Diabetes mellitus without complication (HCC)    DIABETES MELLITUS, WITH NEUROLOGICAL COMPLICATIONS 02/18/2008   Diabetic neuropathy (HCC)    DYSLIPIDEMIA 05/13/2007   Dyslipidemia 04/15/2017   Last  Assessment & Plan:  The patient will continue atorvastatin for her dyslipidemia.   Dyslipidemia associated with type 2 diabetes mellitus (HCC) 07/11/2018   Essential hypertension 03/20/2010   Family history of colon cancer    Fecal incontinence 08/02/2015   Frozen shoulder    Lt   Full dentures    Gastroesophageal reflux disease without esophagitis 04/15/2017   Generalized abdominal pain 02/05/2018   Genetic testing 11/24/2017   Negative genetic testing on the common hereditary cancer panel.  The Hereditary Gene Panel offered by [facility] includes sequencing and/or deletion duplication testing of the following 47 genes: APC, ATM, AXIN2, BARD1, BMPR1A, BRCA1, BRCA2, BRIP1, CDH1, CDK4, CDKN2A (p14ARF), CDKN2A (p16INK4a), CHEK2, CTNNA1, DICER1, EPCAM (Deletion/duplication testing only), GREM1 (promoter region deletion/duplicat   GERD (gastroesophageal reflux disease)    GI bleed 03/15/2016   GOITER, MULTINODULAR 05/13/2007   GOITER, MULTINODULAR 05/13/2007    (last TSH 3.26; US soft tissue neck in 09/2004 showed multinodular goiter with specific small nodule for which was recommended to be followed by repeat US in 3-6 months 12/2016: Thyroid US: recommend repeat in 1 year (one nodule 1.7cm on left thyroid)    Headache    History of blood transfusion    Hx of colonic polyps 12/04/2010   HYPERCHOLESTEROLEMIA 03/20/2010   Hyperparathyroidism (HCC) 04/11/2020   HYPERTENSION 03/20/2010   Hypertension associated with diabetes (HCC) 07/11/2018   Iron deficiency anemia 06/19/2012   Low back pain 03/05/2012   Completed PT-notes say very motivated and had good progress.   CT (03/2020: Mild curvature convex to the right in the upper lumbar region into left in lower lumbar region. No antero or retrolisthesis in the supine position. retrolisthesis at L3-4 of 3 mm with flexion which increases to 6 mm with neutral and extension. Multifactorial spinal stenosis at L3-4 due to circumferential protrusion of the     Lower back pain    Major depression in remission (HCC) 07/07/2018   Last Assessment & Plan:  Mood has seemed good on sertraline.   Malabsorption 03/12/2016   NASH (nonalcoholic steatohepatitis)    Nausea 07/19/2019   Need for immunization against influenza 04/12/2020   No-show for appointment 03/08/2020   Nonobstructive CAD  08/26/2013   The patient has prior cardiac workup in 2006 when she underwent a stress test for abnormal EKG. The stress test was nonconclusive and she underwent cardiac  catheterization that showed 30% stenosis in the LAD in 25% stenosis in RCA she was treated medically.     OA (osteoarthritis)    OBSTRUCTIVE SLEEP APNEA 06/12/2007   uses CPAP.   Pacemaker    Paroxysmal atrial fibrillation (HCC) 05/11/2018   Last Assessment & Plan:  Patient was noted to have episodes of paroxysmal atrial fibrillation that was predominantly rate controlled and at a relatively low burden.  She is chronically on anticoagulation now with Eliquis.  She does not take any negative chronotropic agents with her sinus bradycardia.  If she has any breakthrough more prolonged tachycardic episodes and requires negative chronotropi   Peripheral autonomic neuropathy due to diabetes mellitus (HCC) 07/11/2018   PERIPHERAL NEUROPATHY 01/12/2009   Peripheral neuropathy 01/12/2009   Peripheral vascular disease (HCC)    Presbycusis of both ears 04/11/2020   Primary osteoarthritis involving multiple joints 07/07/2018   Last Assessment & Plan:  Post op left tka and here for rehab as her husband is handicapped so she has limited help at home. Consitpation noted, some pain overnight, better with a pillow under her knee.   Primary osteoarthritis of left knee 04/23/2011   Previously seen by Dr. Dorene Grebe. Will plan on repeat referral after trial injection.      PSORIASIS 05/28/2010   Rectal prolapse 05/24/2015   Rectovaginal fistula 05/25/2014   Renal insufficiency    stage 1 kd   S/P bariatric  surgery-duodenal switch with sleeve gastrectomy 12/04/2013   S/P total knee arthroplasty, right 11/16/2019   Screening mammogram, encounter for 07/12/2019   Second degree AV block 02/08/2020   Serum calcium elevated 04/12/2020   Short bowel syndrome 07/11/2018   Spigelian hernia 02/05/2018   Status post left knee replacement 07/11/2018   Stress 05/24/2015   Surgical counseling visit 10/02/2019   Tubular adenoma of colon 03/09/2016   Colonoscopy in 10/2015: 3  tubular adenomas; GI recommends repeat colonoscopy in 3 years Colonoscopy August 2012: 3 tubular adenomas.    Type 2 diabetes, controlled, with neuropathy (HCC) 02/18/2008   Umbilical hernia without obstruction and without gangrene 04/15/2017   Unilateral primary osteoarthritis, left knee    UNSPECIFIED VENOUS INSUFFICIENCY 05/28/2010   Patient with frequent ulceration related to venous stasis-seen at wound care. Edema and Venous stasis changes due to this . Echo 04/16/11 with EF 60%. No signs diastolic dysfunction.        Vaccination against Streptococcus pneumoniae within past 5 years 04/12/2020   Vaginal irritation 07/19/2019   Venous stasis dermatitis of right lower extremity 01/02/2018   Vitamin D deficiency 03/09/2016   Wears glasses     PAST SURGICAL HISTORY: Past Surgical History:  Procedure Laterality Date   CARDIAC CATHETERIZATION  2006   CATARACT EXTRACTION W/ INTRAOCULAR LENS  IMPLANT, BILATERAL     COLONOSCOPY     DECOMPRESSIVE LUMBAR LAMINECTOMY LEVEL 2 N/A 11/05/2022   Procedure: L3-4, L4-5 DECOMPRESSIVE LUMBAR LAMINECTOMY LEVEL 2;  Surgeon: London Sheer, MD;  Location: [facility] OR;  Service: Orthopedics;  Laterality: N/A;   ELECTROCARDIOGRAM  04/16/2006   EXAMINATION UNDER ANESTHESIA N/A 06/29/2014   Procedure: EXAM UNDER ANESTHESIA;  Surgeon: Sherian Rein, MD;  Location: [facility];  Service: Gynecology;  Laterality: N/A;   Exercise myoview  01/24/2005   FLEXIBLE SIGMOIDOSCOPY     GASTRIC BYPASS  11/09/2013    JOINT REPLACEMENT     KNEE ARTHROSCOPY  2003   right   LAPAROSCOPY N/A 03/12/2016   Procedure: LAPAROSCOPIC ANASTOMOSIS OF INTESTINE (ENTEROENTEROSTOMY);  Surgeon:  Everette Rank, MD;  Location: [facility] ORS;  Service: General;  Laterality: N/A;   LEG SURGERY Left    metal and pins in lower left leg   mrsa Right    arm   MULTIPLE TOOTH EXTRACTIONS     PACEMAKER LEADLESS INSERTION N/A 02/08/2020   Procedure: PACEMAKER LEADLESS INSERTION;  Surgeon: Hillis Range, MD;  Location: [facility];  Service: Cardiovascular;  Laterality: N/A;   PARATHYROIDECTOMY Left 02/19/2021   Procedure: LEFT PARATHYROIDECTOMY WITH FROZEN SECTION;  Surgeon: Serena Colonel, MD;  Location: [facility] OR;  Service: ENT;  Laterality: Left;   SHOULDER ARTHROSCOPY W/ ROTATOR CUFF REPAIR Right    stab phlebectomy  Right 02/10/2019   stab phlebectomy > 20 incisions right leg by Fabienne Bruns MD    TONSILLECTOMY     TOTAL KNEE ARTHROPLASTY Left 06/30/2018   Procedure: LEFT TOTAL KNEE ARTHROPLASTY;  Surgeon: Cammy Copa, MD;  Location: [facility] OR;  Service: Orthopedics;  Laterality: Left;   TOTAL KNEE ARTHROPLASTY Right 11/16/2019   Procedure: RIGHT TOTAL KNEE ARTHROPLASTY-CEMENTED;  Surgeon: Cammy Copa, MD;  Location: [facility] OR;  Service: Orthopedics;  Laterality: Right;   VARICOSE VEIN SURGERY     Remotef    FAMILY HISTORY: Family History  Problem Relation Age of Onset   Hyperlipidemia Father    Hypertension Father    Cirrhosis Father    Colon cancer Father 25       d. 60   Colon cancer Sister 58       d. 70   Bipolar disorder Sister    Aneurysm Mother        brain   Cerebral aneurysm Mother    Colon cancer Sister 87       d. 37   Bipolar disorder Sister    Cancer Paternal Aunt        NOS, ? colon   Congestive Heart Failure Maternal Grandmother    Drug abuse Neg Hx    CAD Neg Hx    Stomach cancer Neg Hx    Hyperparathyroidism Neg Hx    Hypercalcemia Neg Hx     SOCIAL HISTORY: Social  History   Socioeconomic History   Marital status: Married    Spouse name: Tammy Sours   Number of children: 2   Years of education: 12   Highest education level: High school graduate  Occupational History   Occupation: Unemployed    Comment: disabled  Tobacco Use   Smoking status: Former    Current packs/day: 0.00    Average packs/day: 2.0 packs/day for 24.1 years (48.2 ttl pk-yrs)    Types: Cigarettes    Start date: 06/24/1970    Quit date: 08/06/1994    Years since quitting: 28.5   Smokeless tobacco: Never  Vaping Use   Vaping status: Never Used  Substance and Sexual Activity   Alcohol use: No    Alcohol/week: 0.0 standard drinks of alcohol   Drug use: No   Sexual activity: Not Currently  Other Topics Concern   Not on file  Social History Narrative   Patient lives with husband in [place]. Tammy Sours is a [facility] patient.    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    Daughter lives in nearby and is a great help.   Son lives in [place].    2 dogs and 1 cat.   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   Social Determinants of Health   Financial Resource Strain: Low Risk  (  01/10/2023)   Overall Financial Resource Strain (CARDIA)    Difficulty of Paying Living Expenses: Not hard at all  Food Insecurity: No Food Insecurity (01/10/2023)   Hunger Vital Sign    Worried About Running Out of Food in the Last Year: Never true    Ran Out of Food in the Last Year: Never true  Transportation Needs: No Transportation Needs (01/10/2023)   PRAPARE - Administrator, Civil Service (Medical): No    Lack of Transportation (Non-Medical): No  Physical Activity: Inactive (01/10/2023)   Exercise Vital Sign    Days of Exercise per Week: 0 days    Minutes of Exercise per Session: 0 min  Stress: No Stress Concern Present (01/10/2023)   [employer] of Occupational Health - Occupational Stress Questionnaire    Feeling of Stress : Only a little  Social Connections:  Moderately Isolated (01/10/2023)   Social Connection and Isolation Panel [NHANES]    Frequency of Communication with Friends and Family: More than three times a week    Frequency of Social Gatherings with Friends and Family: Three times a week    Attends Religious Services: Never    Active Member of Clubs or Organizations: No    Attends Banker Meetings: Never    Marital Status: Married  Catering manager Violence: Not At Risk (01/10/2023)   Humiliation, Afraid, Rape, and Kick questionnaire    Fear of Current or Ex-Partner: No    Emotionally Abused: No    Physically Abused: No    Sexually Abused: No      Levert Feinstein, M.D. Ph.D.  [facility] [address] [place], [place] [address] Ph: [phone] Fax: [phone]  CC:  McDiarmid, Leighton Roach, MD [address] [place],  [place] [address]  Alfredo Martinez, MD

## 2023-02-10 NOTE — Telephone Encounter (Signed)
medicare/[employer] sup NPR sent to [facility] since she has a pacemaker. [phone]

## 2023-02-17 ENCOUNTER — Ambulatory Visit (HOSPITAL_COMMUNITY): Payer: Medicare Other

## 2023-02-18 NOTE — Progress Notes (Signed)
Remote pacemaker transmission.   

## 2023-02-19 ENCOUNTER — Ambulatory Visit: Payer: Medicare Other | Admitting: Student

## 2023-02-19 ENCOUNTER — Encounter: Payer: Self-pay | Admitting: Student

## 2023-02-19 ENCOUNTER — Ambulatory Visit (HOSPITAL_COMMUNITY): Admission: RE | Admit: 2023-02-19 | Payer: Medicare Other | Source: Ambulatory Visit

## 2023-02-19 VITALS — BP 113/43 | HR 75 | Ht 68.0 in | Wt 167.5 lb

## 2023-02-19 DIAGNOSIS — R413 Other amnesia: Secondary | ICD-10-CM

## 2023-02-19 DIAGNOSIS — I152 Hypertension secondary to endocrine disorders: Secondary | ICD-10-CM | POA: Diagnosis not present

## 2023-02-19 DIAGNOSIS — R4789 Other speech disturbances: Secondary | ICD-10-CM | POA: Diagnosis not present

## 2023-02-19 DIAGNOSIS — E1140 Type 2 diabetes mellitus with diabetic neuropathy, unspecified: Secondary | ICD-10-CM | POA: Diagnosis not present

## 2023-02-19 DIAGNOSIS — Z7289 Other problems related to lifestyle: Secondary | ICD-10-CM

## 2023-02-19 DIAGNOSIS — E1159 Type 2 diabetes mellitus with other circulatory complications: Secondary | ICD-10-CM | POA: Diagnosis not present

## 2023-02-19 LAB — POCT GLYCOSYLATED HEMOGLOBIN (HGB A1C): HbA1c, POC (controlled diabetic range): 5.4 % (ref 0.0–7.0)

## 2023-02-19 NOTE — Assessment & Plan Note (Signed)
Continue with semaglutide 2 mg.  The patient receives the medication here in clinic, Durward Mallard will message when it has arrived.  Continue with statin, up-to-date on microalbumin, needs eye exam, discussed this with patient and she will schedule.

## 2023-02-19 NOTE — Progress Notes (Signed)
SUBJECTIVE:   CHIEF COMPLAINT / HPI:   Type 2 Diabetes: Home medications include: Semaglutide 2 mg weekly, on statin. Does endorse compliance.   Most recent A1Cs:  Lab Results  Component Value Date   HGBA1C 5.4 02/19/2023   HGBA1C 5.6 10/30/2022   Last Microalbumin, LDL, Creatinine: Lab Results  Component Value Date   MICROALBUR 4.08 (H) 09/17/2010   LDLCALC 112 (H) 11/16/2020   CREATININE 1.40 (H) 01/22/2023    Patient is not up to date on diabetic eye. Will schedule appt.  Patient is up to date on diabetic foot exam.  Hypertension: BP: (!) 113/43 today. Home medications include: Amlodipine 10 mg, Zestoretic, Spironolactone. She endorses taking these medications as prescribed.  Most recent creatinine trend:  Lab Results  Component Value Date   CREATININE 1.40 (H) 01/22/2023   CREATININE 1.39 (H) 01/20/2023   CREATININE 1.40 (H) 11/06/2022   Patient has had a BMP in the past 1 year.  Memory Decline  Word Finding Difficulty: -Seeing neurology -Has MRI brain today  -Patient reports that she is unsure if she took her medications today - Her husband does help around the house because she has become quite forgetful - Has not had any recent falls or head trauma  PERTINENT  PMH / PSH:  Coronary artery disease Hypertension Paroxysmal A-fib obesity with sleep apnea -- Eliquis  GERD short-bowel syndrome h/o bariatric surgery  Hyperparathyroidism Hypothyroidism Hyperlipidemia Parathyroid adenoma Peripheral neuropathy Age-related cognitive decline B12 deficiency Pacemaker? Major depression Vitamin D deficiency   Patient Care Team: Alfredo Martinez, MD as PCP - General (Family Medicine) Lanier Prude, MD as PCP - Electrophysiology (Cardiology) Linna Darner, RD as Dietitian (Family Medicine) Fredrich Birks, OD as Referring Physician (Optometry) Alva Garnet Tyrone Apple, MD as Attending Physician (Bariatrics) August Saucer, Corrie Mckusick, MD as Consulting Physician (Orthopedic  Surgery) Jeralyn Ruths, MD as Consulting Physician (Oncology) OBJECTIVE:  BP (!) 113/43   Pulse 75   Ht 5\' 8"  (1.727 m)   Wt 167 lb 8 oz (76 kg)   SpO2 99%   BMI 25.47 kg/m  Physical Exam  General: Alert in no apparent distress Heart: Regular rate and rhythm with no murmurs appreciated Lungs: CTA bilaterally, no wheezing Skin: Warm and dry  ASSESSMENT/PLAN:  Type 2 diabetes, controlled, with neuropathy (HCC) Assessment & Plan: Continue with semaglutide 2 mg.  The patient receives the medication here in clinic, Durward Mallard will message when it has arrived.  Continue with statin, up-to-date on microalbumin, needs eye exam, discussed this with patient and she will schedule.  Orders: -     POCT glycosylated hemoglobin (Hb A1C)  Word finding difficulty -     Vitamin B1 -     RPR  Other problems related to lifestyle -     HIV Antibody (routine testing w rflx)  Hypertension associated with diabetes (HCC) Assessment & Plan: Discontinue amlodipine, continue with Zestoretic and spironolactone at current dosages.  Provided the patient with blood pressure log for her to log her pressures at home over the next week or so.  Gave the patient parameters on AVS for her to call me.  Patient vocalized understanding.   Memory loss Assessment & Plan: Unknown cause, started patient on B12 supplementation, TSH was recently normal, CBC and BMP overall unremarkable on last check.  Will check thiamine given patient's history of bariatric surgery as well as HIV and RPR today.  She is seeing neurology and they have ordered an MRI brain to be performed today.  Return precautions provided, discussed that she is a high fall risk.  Orders: -     Vitamin B1 -     RPR   Return in about 4 weeks (around 03/19/2023) for Memory concern, blood pressure. Alfredo Martinez, MD 02/19/2023, 11:19 AM PGY-3, [facility]

## 2023-02-19 NOTE — Assessment & Plan Note (Signed)
Discontinue amlodipine, continue with Zestoretic and spironolactone at current dosages.  Provided the patient with blood pressure log for her to log her pressures at home over the next week or so.  Gave the patient parameters on AVS for her to call me.  Patient vocalized understanding.

## 2023-02-19 NOTE — Assessment & Plan Note (Signed)
Unknown cause, started patient on B12 supplementation, TSH was recently normal, CBC and BMP overall unremarkable on last check.  Will check thiamine given patient's history of bariatric surgery as well as HIV and RPR today.  She is seeing neurology and they have ordered an MRI brain to be performed today.  Return precautions provided, discussed that she is a high fall risk.

## 2023-02-19 NOTE — Patient Instructions (Addendum)
It was great to see you today! Thank you for choosing [facility] for your primary care.  Today we addressed: STOP the amlodipine altogether  Take your blood pressure every day or two Continue with the Ozempic 2 mg  We will check some labs today     Blood Pressure Record Sheet To take your blood pressure, you will need a blood pressure machine. You can buy a blood pressure machine (blood pressure monitor) at your clinic, drug store, or online. When choosing one, consider: An automatic monitor that has an arm cuff. A cuff that wraps snugly around your upper arm. You should be able to fit only one finger between your arm and the cuff. A device that stores blood pressure reading results. Do not choose a monitor that measures your blood pressure from your wrist or finger. Follow your health care provider's instructions for how to take your blood pressure. To use this form: Take your blood pressure medications every day These measurements should be taken when you have been at rest for at least 10-15 min Take at least 2 readings with each blood pressure check. This makes sure the results are correct. Wait 1-2 minutes between measurements. Write down the results in the spaces on this form. Keep in mind it should always be recorded systolic over diastolic. Both numbers are important.  Repeat this every day for 2-3 weeks, or as told by your health care provider.  Make a follow-up appointment with your health care provider to discuss the results.  Blood Pressure Log Date Medications taken? (Y/N) Blood Pressure Time of Day                                                                                                         If you haven't already, sign up for My Chart to have easy access to your labs results, and communication with your primary care physician. We are checking some labs today. If they are abnormal, I will call you. If they are normal, I will send you  a MyChart message (if it is active) or a letter in the mail. If you do not hear about your labs in the next 2 weeks, please call the office. I recommend that you always bring your medications to each appointment as this makes it easy to ensure you are on the correct medications and helps [place] not miss refills when you need them. Call the clinic at [phone] if your symptoms worsen or you have any concerns. Return in about 4 weeks (around 03/19/2023) for Memory concern, blood pressure. Please arrive 15 minutes before your appointment to ensure smooth check in process.  We appreciate your efforts in making this happen.  Thank you for allowing me to participate in your care, Alfredo Martinez, MD 02/19/2023, 11:03 AM PGY-3, [facility]

## 2023-02-20 ENCOUNTER — Telehealth: Payer: Self-pay | Admitting: Orthopedic Surgery

## 2023-02-20 DIAGNOSIS — F329 Major depressive disorder, single episode, unspecified: Secondary | ICD-10-CM | POA: Diagnosis not present

## 2023-02-20 DIAGNOSIS — G8929 Other chronic pain: Secondary | ICD-10-CM | POA: Diagnosis not present

## 2023-02-20 DIAGNOSIS — M48062 Spinal stenosis, lumbar region with neurogenic claudication: Secondary | ICD-10-CM | POA: Diagnosis not present

## 2023-02-20 DIAGNOSIS — I10 Essential (primary) hypertension: Secondary | ICD-10-CM | POA: Diagnosis not present

## 2023-02-20 DIAGNOSIS — E1142 Type 2 diabetes mellitus with diabetic polyneuropathy: Secondary | ICD-10-CM | POA: Diagnosis not present

## 2023-02-20 DIAGNOSIS — I2510 Atherosclerotic heart disease of native coronary artery without angina pectoris: Secondary | ICD-10-CM | POA: Diagnosis not present

## 2023-02-20 LAB — HIV ANTIBODY (ROUTINE TESTING W REFLEX): HIV Screen 4th Generation wRfx: NONREACTIVE

## 2023-02-20 NOTE — Telephone Encounter (Signed)
I see that this is something that you have been working on with the pt. Not sur if forms have come over yet but sending to you.

## 2023-02-20 NOTE — Telephone Encounter (Signed)
[name] from [facility] called. Would like orders for AFO. Her call back number is [phone]

## 2023-02-22 ENCOUNTER — Ambulatory Visit (HOSPITAL_COMMUNITY)
Admission: RE | Admit: 2023-02-22 | Discharge: 2023-02-22 | Disposition: A | Discharge status: Home / Self Care | Payer: Medicare Other | Source: Ambulatory Visit | Attending: Neurology | Admitting: Neurology

## 2023-02-22 DIAGNOSIS — R413 Other amnesia: Secondary | ICD-10-CM

## 2023-02-25 ENCOUNTER — Telehealth: Payer: Self-pay | Admitting: Orthopedic Surgery

## 2023-02-25 ENCOUNTER — Telehealth: Payer: Self-pay

## 2023-02-25 LAB — RPR: RPR Ser Ql: NONREACTIVE

## 2023-02-25 LAB — VITAMIN B1: Thiamine: 101.5 nmol/L (ref 66.5–200.0)

## 2023-02-25 NOTE — Telephone Encounter (Signed)
Lmom for [employer] to call back with a fax number to send the order to.

## 2023-02-25 NOTE — Telephone Encounter (Signed)
Last refill of Ozempic 2mg  dose pens never shipped in July due to previous provider state license number expiring.   Refills pending, in Dr. Carlena Bjornstad box.

## 2023-02-25 NOTE — Telephone Encounter (Signed)
[name] called. The fax number for the AFO RX to be sent to is [phone]

## 2023-02-26 NOTE — Telephone Encounter (Signed)
Rx faxed to [facility] @ [phone]

## 2023-02-26 NOTE — Addendum Note (Signed)
Addended by: Levert Feinstein on: 02/26/2023 11:06 AM   Modules accepted: Orders

## 2023-02-26 NOTE — Telephone Encounter (Signed)
Rx faxed

## 2023-02-28 DIAGNOSIS — H43823 Vitreomacular adhesion, bilateral: Secondary | ICD-10-CM | POA: Diagnosis not present

## 2023-02-28 DIAGNOSIS — E113313 Type 2 diabetes mellitus with moderate nonproliferative diabetic retinopathy with macular edema, bilateral: Secondary | ICD-10-CM | POA: Diagnosis not present

## 2023-02-28 DIAGNOSIS — Z961 Presence of intraocular lens: Secondary | ICD-10-CM | POA: Diagnosis not present

## 2023-02-28 DIAGNOSIS — H35033 Hypertensive retinopathy, bilateral: Secondary | ICD-10-CM | POA: Diagnosis not present

## 2023-03-03 ENCOUNTER — Other Ambulatory Visit: Payer: Self-pay

## 2023-03-03 DIAGNOSIS — R4586 Emotional lability: Secondary | ICD-10-CM

## 2023-03-04 NOTE — Telephone Encounter (Signed)
FAXED REFILLS TO [facility]

## 2023-03-05 DIAGNOSIS — F329 Major depressive disorder, single episode, unspecified: Secondary | ICD-10-CM | POA: Diagnosis not present

## 2023-03-05 DIAGNOSIS — I10 Essential (primary) hypertension: Secondary | ICD-10-CM | POA: Diagnosis not present

## 2023-03-05 DIAGNOSIS — M48062 Spinal stenosis, lumbar region with neurogenic claudication: Secondary | ICD-10-CM | POA: Diagnosis not present

## 2023-03-05 DIAGNOSIS — E1142 Type 2 diabetes mellitus with diabetic polyneuropathy: Secondary | ICD-10-CM | POA: Diagnosis not present

## 2023-03-05 DIAGNOSIS — I2510 Atherosclerotic heart disease of native coronary artery without angina pectoris: Secondary | ICD-10-CM | POA: Diagnosis not present

## 2023-03-05 DIAGNOSIS — G8929 Other chronic pain: Secondary | ICD-10-CM | POA: Diagnosis not present

## 2023-03-05 MED ORDER — SERTRALINE HCL 100 MG PO TABS: 100.0000 mg | ORAL_TABLET | Freq: Every day | ORAL | 1 refills | Status: DC

## 2023-03-19 ENCOUNTER — Other Ambulatory Visit: Payer: Self-pay | Admitting: Physician Assistant

## 2023-03-19 ENCOUNTER — Other Ambulatory Visit: Payer: Self-pay | Admitting: Student

## 2023-03-19 DIAGNOSIS — I480 Paroxysmal atrial fibrillation: Secondary | ICD-10-CM

## 2023-03-19 DIAGNOSIS — R4586 Emotional lability: Secondary | ICD-10-CM

## 2023-03-19 NOTE — Telephone Encounter (Signed)
Prescription refill request for Eliquis received. Indication: PAF Last office visit: 10/31/22 Percell Belt NP Scr: 1.40 on 01/22/23  Epic Age: 68 Weight: 79.8kg  Based on above findings Eliquis 5mg  twice daily is the appropriate dose.  Refill approved.

## 2023-03-24 NOTE — Telephone Encounter (Signed)
Shipment given to patient.

## 2023-03-28 ENCOUNTER — Ambulatory Visit (HOSPITAL_COMMUNITY)
Admission: RE | Admit: 2023-03-28 | Discharge: 2023-03-28 | Disposition: A | Discharge status: Home / Self Care | Payer: Medicare Other | Source: Ambulatory Visit | Attending: Neurology | Admitting: Neurology

## 2023-03-28 DIAGNOSIS — E538 Deficiency of other specified B group vitamins: Secondary | ICD-10-CM | POA: Insufficient documentation

## 2023-03-28 DIAGNOSIS — R413 Other amnesia: Secondary | ICD-10-CM | POA: Diagnosis not present

## 2023-03-28 DIAGNOSIS — R9089 Other abnormal findings on diagnostic imaging of central nervous system: Secondary | ICD-10-CM | POA: Diagnosis not present

## 2023-03-28 DIAGNOSIS — R269 Unspecified abnormalities of gait and mobility: Secondary | ICD-10-CM | POA: Diagnosis not present

## 2023-03-28 DIAGNOSIS — R4789 Other speech disturbances: Secondary | ICD-10-CM | POA: Insufficient documentation

## 2023-04-02 ENCOUNTER — Telehealth: Payer: Self-pay | Admitting: Neurology

## 2023-04-02 NOTE — Telephone Encounter (Signed)
Pt's daughter is asking to be called with results to MRI if pt is not available

## 2023-04-03 ENCOUNTER — Telehealth: Payer: Self-pay | Admitting: Orthopedic Surgery

## 2023-04-03 NOTE — Telephone Encounter (Signed)
Received vm from [facility] w/ [facility], they received the signed SWO from Dr. Christell Constant, they need the last notes. I faxed 02/05/23 ov note [phone], callback [phone]

## 2023-04-08 ENCOUNTER — Telehealth: Payer: Self-pay | Admitting: Orthopedic Surgery

## 2023-04-08 NOTE — Telephone Encounter (Signed)
Melissa from [facility] needs a diagnosis code on the notes that you provided. You can fax [phone]. She has the diagnosis code on the referral but need it on the notes as well.CB#[phone]

## 2023-04-09 NOTE — Telephone Encounter (Signed)
Faxed note with written dx code

## 2023-04-10 ENCOUNTER — Telehealth: Payer: Self-pay | Admitting: Orthopedic Surgery

## 2023-04-10 NOTE — Telephone Encounter (Signed)
Melissa from [facility] called stating patient [name] notes doesn't match prescription. Melissa advised to give her a call back please. [facility] [phone]

## 2023-04-14 NOTE — Telephone Encounter (Signed)
Faxed note with addendum for foot drop to [facility]

## 2023-04-15 NOTE — Telephone Encounter (Signed)
At 2:52 pm pt's daughter left a vm stating they are still waiting for the results to the MRI, please call when results are available.

## 2023-04-15 NOTE — Telephone Encounter (Signed)
Phone room: Please tell the pt that the exam is showing completed but provider hasn't reviewed the images and made final recommendations yet.  Thanks,  Production assistant, radio

## 2023-04-16 NOTE — Telephone Encounter (Signed)
Phone room: Please tell the pt that the exam is showing completed but provider hasn't reviewed the images and made final recommendations yet.  Thanks,  Cinda Quest  We have to make 3 attempts this will be attempt number 2

## 2023-04-28 ENCOUNTER — Ambulatory Visit (INDEPENDENT_AMBULATORY_CARE_PROVIDER_SITE_OTHER): Payer: Medicare Other | Admitting: Student

## 2023-04-28 ENCOUNTER — Encounter: Payer: Self-pay | Admitting: Student

## 2023-04-28 VITALS — BP 122/60 | HR 61 | Ht 68.0 in | Wt 165.6 lb

## 2023-04-28 DIAGNOSIS — Z23 Encounter for immunization: Secondary | ICD-10-CM

## 2023-04-28 DIAGNOSIS — Z9884 Bariatric surgery status: Secondary | ICD-10-CM | POA: Diagnosis not present

## 2023-04-28 DIAGNOSIS — E1140 Type 2 diabetes mellitus with diabetic neuropathy, unspecified: Secondary | ICD-10-CM | POA: Diagnosis not present

## 2023-04-28 DIAGNOSIS — L03119 Cellulitis of unspecified part of limb: Secondary | ICD-10-CM

## 2023-04-28 DIAGNOSIS — Z7985 Long-term (current) use of injectable non-insulin antidiabetic drugs: Secondary | ICD-10-CM

## 2023-04-28 DIAGNOSIS — R4181 Age-related cognitive decline: Secondary | ICD-10-CM

## 2023-04-28 MED ORDER — AMOXICILLIN-POT CLAVULANATE 875-125 MG PO TABS
1.0000 | ORAL_TABLET | Freq: Two times a day (BID) | ORAL | 0 refills | Status: AC
Start: 2023-04-28 — End: 2023-05-05

## 2023-04-28 MED ORDER — DOXYCYCLINE HYCLATE 100 MG PO TABS
100.0000 mg | ORAL_TABLET | Freq: Two times a day (BID) | ORAL | 0 refills | Status: AC
Start: 2023-04-28 — End: 2023-05-05

## 2023-04-28 NOTE — Telephone Encounter (Signed)
Agree to keep previously scheduled Feb 2025 appt. If family has concern about, may come in with her at that visit.

## 2023-04-28 NOTE — Telephone Encounter (Signed)
Spoke with pt, husband and daughter on phone today. They had requested to make a sooner f/u to go over MRI results and further recommendations. Daughter stated patient is currently scheduling the appointment for neuropsychology testing done. This has not been done yet. I recommended we keep her current f/u and unless she is having worsening symptoms or something new going on, I wasn't sure a sooner appt would be beneficial at this time before she has that testing done and MRI was normal. I let her know I would still send a message to Dr. Zannie Cove nurses to see if they agree or having any recommendations in the meantime. Please advise

## 2023-04-28 NOTE — Patient Instructions (Addendum)
It was great to see you today! Thank you for choosing [facility] for your primary care.  Today we addressed: [phone]--call neurology for an appt  We will check your B12 again today Also, geriatric appt at 1:30PM on December 5th Augmentin and Doxycycline twice a day for 7 days and return in 1 week   If you haven't already, sign up for My Chart to have easy access to your labs results, and communication with your primary care physician. We are checking some labs today. If they are abnormal, I will call you. If they are normal, I will send you a MyChart message (if it is active) or a letter in the mail. If you do not hear about your labs in the next 2 weeks, please call the office. I recommend that you always bring your medications to each appointment as this makes it easy to ensure you are on the correct medications and helps [place] not miss refills when you need them. Call the clinic at [phone] if your symptoms worsen or you have any concerns.  Please arrive 15 minutes before your appointment to ensure smooth check in process.  We appreciate your efforts in making this happen.  Thank you for allowing me to participate in your care, Alfredo Martinez, MD 04/28/2023, 3:46 PM PGY-3, [facility]

## 2023-04-28 NOTE — Progress Notes (Unsigned)
    SUBJECTIVE:   CHIEF COMPLAINT / HPI:   Hypertension: BP: 122/60 today. Home medications include: Cleda Daub and Zestoretic. She {Blank single:19197::"endorses","does not endorse"} taking these medications as prescribed. {Blank single:19197::"Does","Does not"} check blood pressure at home.*** Diet ***. Exercise ***. Most recent creatinine trend:  Lab Results  Component Value Date   CREATININE 1.40 (H) 01/22/2023   CREATININE 1.39 (H) 01/20/2023   CREATININE 1.40 (H) 11/06/2022   Patient {HAS HAS AOZ:30865} had a BMP in the past 1 year.   PERTINENT  PMH / PSH:  Coronary artery disease Hypertension - Zestoretic and spironolactone  Paroxysmal A-fib obesity with sleep apnea -- Eliquis  GERD short-bowel syndrome h/o bariatric surgery  Hyperparathyroidism Hypothyroidism - TS    H 3 mo ago normal  Hyperlipidemia Parathyroid adenoma Peripheral neuropathy Age-related cognitive decline B12 deficiency Pacemaker? Major depression Vitamin D deficiency  OBJECTIVE:   BP 122/60   Pulse 61   Ht 5\' 8"  (1.727 m)   Wt 165 lb 9.6 oz (75.1 kg)   SpO2 99%   BMI 25.18 kg/m   General: Alert and oriented in no apparent distress Heart: Regular rate and rhythm with no murmurs appreciated Lungs: CTA bilaterally, no wheezing Abdomen: Bowel sounds present, no abdominal pain Skin: Warm and dry Extremities: No lower extremity edema   ASSESSMENT/PLAN:      Alfredo Martinez, MD [facility]

## 2023-04-29 LAB — MICROALBUMIN / CREATININE URINE RATIO
Creatinine, Urine: 89.4 mg/dL
Microalb/Creat Ratio: 19 mg/g{creat} (ref 0–29)
Microalbumin, Urine: 16.7 ug/mL

## 2023-04-29 LAB — VITAMIN B12: Vitamin B-12: >2000 pg/mL — ABNORMAL HIGH (ref 232–1245)

## 2023-04-29 NOTE — Assessment & Plan Note (Signed)
B12 deficiency on oral supplement. Will check B12 to ensure she is absorbing the oral supplement given history of RouxEnY

## 2023-04-29 NOTE — Assessment & Plan Note (Signed)
Needs UACr

## 2023-04-29 NOTE — Assessment & Plan Note (Signed)
With word finding difficulty, MR no acute findings. Reversible causes largely negative, neurology following. Will send to Dr. McDiarmid to try to get scheduled in geriatric clinic for [facility], gait assessment, ADL assessment.

## 2023-05-02 ENCOUNTER — Encounter (HOSPITAL_BASED_OUTPATIENT_CLINIC_OR_DEPARTMENT_OTHER): Payer: Medicare Other | Attending: General Surgery | Admitting: General Surgery

## 2023-05-02 DIAGNOSIS — L97812 Non-pressure chronic ulcer of other part of right lower leg with fat layer exposed: Secondary | ICD-10-CM | POA: Diagnosis not present

## 2023-05-02 DIAGNOSIS — I480 Paroxysmal atrial fibrillation: Secondary | ICD-10-CM | POA: Diagnosis not present

## 2023-05-02 DIAGNOSIS — M1990 Unspecified osteoarthritis, unspecified site: Secondary | ICD-10-CM | POA: Diagnosis not present

## 2023-05-02 DIAGNOSIS — R600 Localized edema: Secondary | ICD-10-CM | POA: Diagnosis not present

## 2023-05-02 DIAGNOSIS — I872 Venous insufficiency (chronic) (peripheral): Secondary | ICD-10-CM | POA: Diagnosis not present

## 2023-05-02 DIAGNOSIS — G4730 Sleep apnea, unspecified: Secondary | ICD-10-CM | POA: Insufficient documentation

## 2023-05-02 DIAGNOSIS — E11622 Type 2 diabetes mellitus with other skin ulcer: Secondary | ICD-10-CM | POA: Insufficient documentation

## 2023-05-02 DIAGNOSIS — I10 Essential (primary) hypertension: Secondary | ICD-10-CM | POA: Diagnosis not present

## 2023-05-02 DIAGNOSIS — Z87891 Personal history of nicotine dependence: Secondary | ICD-10-CM | POA: Diagnosis not present

## 2023-05-02 DIAGNOSIS — E1140 Type 2 diabetes mellitus with diabetic neuropathy, unspecified: Secondary | ICD-10-CM | POA: Insufficient documentation

## 2023-05-02 NOTE — Progress Notes (Signed)
[name], [name] ([id number]) [id number] Page 1 of 9 Visit Report for 05/02/2023 Allergy List Details Patient Name: Date of Service: [name], [name] 05/02/2023 8:15 A M Medical Record Number: [id number] Patient Account Number: [id number] Date of Birth/Sex: Treating RN: 08/20/1955 (67 y.o. [name] Primary Care [name]: [name] Other Clinician: Referring [name]: Treating [name]/Extender: [name], [name] in Treatment: 0 Allergies Active Allergies cephalexin latex Westcort (neomycin-polymx-HC) adhesive tape Allergy Notes Electronic Signature(s) Signed: 05/02/2023 12:21:02 PM By: [name] Schwalbe RN Entered By: [name] on 05/02/2023 08:36:18 -------------------------------------------------------------------------------- Arrival Information Details Patient Name: Date of Service: [name] 05/02/2023 8:15 A M Medical Record Number: [id number] Patient Account Number: [id number] Date of Birth/Sex: Treating RN: [birth year] (67 y.o. [name] Primary Care [name]: [name] Other Clinician: Referring [name]: Treating [name]/Extender: [name], [name] in Treatment: 0 Visit Information Patient Arrived: Cane Arrival Time: 08:34 Accompanied By: daughter Transfer Assistance: None Patient Identification Verified: Yes Patient Has Alerts: Yes Patient Alerts: Patient on Blood Thinner Blood Thinner History Since Last Visit Added or deleted any medications: No Any new allergies or adverse reactions: No Had a fall or experienced change in activities of daily living that may affect risk of falls: No Signs or symptoms of abuse/neglect since last visito No Hospitalized since last visit: No Implantable device outside of the clinic excluding cellular tissue based products placed in the center since last visit: No Electronic Signature(s) Signed: 05/02/2023 12:52:25 PM By: [name] Entered By: [name] on 05/02/2023 09:17:57 [name], [name] ([id number]) [id number] Page 2 of 9 -------------------------------------------------------------------------------- Compression Therapy Details Patient Name: Date of Service: [name], [name] 05/02/2023 8:15 A M Medical Record Number: [id number] Patient Account Number: [id number] Date of Birth/Sex: Treating RN: 1955/07/16 (67 y.o. [name] Primary Care [name]: [name] Other Clinician: Referring Ankush Gintz: Treating Detrell Umscheid/Extender: [name], [name] in Treatment: 0 Compression Therapy Performed for Wound Assessment: Wound #8 Right,Proximal,Anterior Lower Leg Performed By: Clinician [name] Deed, RN Compression Type: Double Layer Post Procedure Diagnosis Same as Pre-procedure Notes urgo lite Electronic Signature(s) Signed: 05/02/2023 1:08:45 PM By: [name] Deed RN, BSN Entered By: [name] on 05/02/2023 09:15:09 -------------------------------------------------------------------------------- Encounter Discharge Information Details Patient Name: Date of Service: [name] 05/02/2023 8:15 A M Medical Record Number: [id number] Patient Account Number: [id number] Date of Birth/Sex: Treating RN: July 19, 1955 (67 y.o. [name] Primary Care [name]: [name] Other Clinician: Referring [name]: Treating [name]/Extender: [name], [name] in Treatment: 0 Encounter Discharge Information Items Post Procedure Vitals Discharge Condition: Stable Temperature (F): 97.5 Ambulatory Status: Cane Pulse (bpm): 71 Discharge Destination: Home Respiratory Rate (breaths/min): 16 Transportation: Private Auto Blood Pressure (mmHg): 137/72 Accompanied By: self Schedule Follow-up Appointment: Yes Clinical Summary of Care: Patient Declined Electronic Signature(s) Signed: 05/02/2023 12:10:28 PM By: Samuella Bruin Entered By: Samuella Bruin on 05/02/2023 09:35:32 -------------------------------------------------------------------------------- Lower Extremity Assessment Details Patient Name: Date of Service: [name], [name] 05/02/2023 8:15 A M Medical Record Number: [id number] Patient Account Number: [id number] Date of Birth/Sex: Treating RN: 10/31/1955 (67 y.o. [name] Primary Care [name]: [name] Other Clinician: Referring [name]: Treating [name]/Extender: [name], [name] ([id number]) [id number] Page 3 of 9 [name] in Treatment: 0 Edema Assessment Assessed: [Left: No] [Right: Yes] Edema: [Left: Ye] [Right: s] Calf Left: Right: Point of Measurement: 36 cm From Medial Instep 36 cm Ankle Left: Right: Point of Measurement: 9 cm From Medial Instep 25 cm Knee To Floor Left: Right: From  Medial Instep 48 cm Vascular Assessment Pulses: Dorsalis Pedis Palpable: [Right:Yes] Doppler Audible: [Right:Inaudible] Posterior Tibial Palpable: [Right:Yes] Doppler Audible: [Right:Yes] Extremity colors, hair growth, and conditions: Extremity Color: [Right:Red] Hair Growth on Extremity: [Right:No] Temperature of Extremity: [Right:Warm] Capillary Refill: [Right:< 3 seconds] Dependent Rubor: [Right:No] Blanched when Elevated: [Right:No] Lipodermatosclerosis: [Right:No] Blood Pressure: Brachial: [Right:137] Ankle: [Right:Posterior Tibial: 120 0.88] Toe Nail Assessment Left: Right: Thick: No Discolored: No Deformed: No Improper Length and Hygiene: No Electronic Signature(s) Signed: 05/02/2023 12:21:02 PM By: [name] Schwalbe RN Entered By: [name] on 05/02/2023 09:01:12 -------------------------------------------------------------------------------- Multi Wound Chart Details Patient Name: Date of Service: [name] 05/02/2023 8:15 A M Medical Record Number: [id number] Patient Account Number:  [id number] Date of Birth/Sex: Treating RN: 01-21-1956 (67 y.o. F) Primary Care Carnisha Feltz: [name] Other Clinician: Referring Marquesha Robideau: Treating Tamirra Sienkiewicz/Extender: [name], [name] in Treatment: 0 Vital Signs Height(in): 70 Pulse(bpm): 71 Weight(lbs): 166 Blood Pressure(mmHg): 137/72 Body Mass Index(BMI): 23.8 Stcyr, [name] ([id number]) [id number] Page 4 of 9 Temperature(F): 97.5 Respiratory Rate(breaths/min): 16 [8:Photos:] [N/A:N/A] Right, Proximal, Anterior Lower Leg Left, Distal, Anterior Lower Leg N/A Wound Location: Gradually Appeared Trauma N/A Wounding Event: Venous Leg Ulcer Venous Leg Ulcer N/A Primary Etiology: N/A Diabetic Wound/Ulcer of the Lower N/A Secondary Etiology: Extremity Cataracts, Anemia, Sleep Apnea, Cataracts, Anemia, Sleep Apnea, N/A Comorbid History: Arrhythmia, Coronary Artery Disease, Arrhythmia, Coronary Artery Disease, Hypertension, Peripheral Venous Hypertension, Peripheral Venous Disease, Type II Diabetes, Disease, Type II Diabetes, Osteoarthritis, Neuropathy Osteoarthritis, Neuropathy 03/27/2023 04/10/2023 N/A Date Acquired: 0 0 N/A [name] of Treatment: Open Open N/A Wound Status: No No N/A Wound Recurrence: 1x1x0.1 2.3x1.8x0.1 N/A Measurements [name] x W x D (cm) 0.785 3.252 N/A A (cm) : rea 0.079 0.325 N/A Volume (cm) : Unclassifiable Unclassifiable N/A Classification: None Present Medium N/A Exudate A mount: N/A Serosanguineous N/A Exudate Type: N/A red, brown N/A Exudate Color: Distinct, outline attached Distinct, outline attached N/A Wound Margin: N/A None Present (0%) N/A Granulation A mount: N/A Large (67-100%) N/A Necrotic A mount: Eschar Eschar, Adherent Slough N/A Necrotic Tissue: Fascia: No Fascia: No N/A Exposed Structures: Fat Layer (Subcutaneous Tissue): No Fat Layer (Subcutaneous Tissue): No Tendon: No Tendon: No Muscle: No Muscle: No Joint: No Joint:  No Bone: No Bone: No None Small (1-33%) N/A Epithelialization: Debridement - Excisional Debridement - Excisional N/A Debridement: Pre-procedure Verification/Time Out 09:05 09:05 N/A Taken: Lidocaine 4% Topical Solution Lidocaine 4% Topical Solution N/A [name] Control: Necrotic/Eschar, Subcutaneous, Necrotic/Eschar, Subcutaneous, N/A Tissue Debrided: [employer] Skin/Subcutaneous Tissue Skin/Subcutaneous Tissue N/A Level: 0.78 3.25 N/A Debridement A (sq cm): rea Curette Curette N/A Instrument: Minimum Minimum N/A Bleeding: Pressure Pressure N/A Hemostasis Achieved: 7 7 N/A Procedural [name]: 5 5 N/A Post Procedural [name]: Debridement Treatment Response: Procedure was tolerated well Procedure was tolerated well N/A Post Debridement Measurements [name] x 1x1x0.1 2.3x1.8x0.1 N/A W x D (cm) 0.079 0.325 N/A Post Debridement Volume: (cm) Excoriation: No Excoriation: No N/A Periwound Skin Texture: Induration: No Induration: No Callus: No Callus: No Crepitus: No Crepitus: No Rash: No Rash: No Scarring: No Scarring: No Maceration: No Maceration: No N/A Periwound Skin Moisture: Dry/Scaly: No Dry/Scaly: No Atrophie Blanche: No Erythema: Yes N/A Periwound Skin Color: Cyanosis: No Atrophie Blanche: No Ecchymosis: No Cyanosis: No Erythema: No Ecchymosis: No Hemosiderin Staining: No Hemosiderin Staining: No Mottled: No Mottled: No Pallor: No Pallor: No Rubor: No Rubor: No N/A Circumferential N/A Erythema Location: N/A No Abnormality N/A Temperature: Compression Therapy Debridement N/A Procedures Performed: Debridement [name], [name] [name] ([id number]) [id number] Page 5 of  9 Treatment Notes Wound #8 (Lower Leg) Wound Laterality: Right, Anterior, Proximal Cleanser Peri-Wound Care Sween Lotion (Moisturizing lotion) Discharge Instruction: Apply moisturizing lotion as directed Topical Primary Dressing Maxorb Extra Calcium Alginate, 2x2  (in/in) Discharge Instruction: Apply to wound bed as instructed Secondary Dressing ABD Pad, 8x10 Discharge Instruction: Apply over primary dressing as directed. Secured With Compression Wrap Urgo K2 Lite, (equivalent to a 3 layer) two layer compression system, regular Discharge Instruction: Apply Urgo K2 Lite as directed (alternative to 3 layer compression). Compression Stockings Add-Ons Wound #9 (Lower Leg) Wound Laterality: Right, Anterior, Distal Cleanser Peri-Wound Care Sween Lotion (Moisturizing lotion) Discharge Instruction: Apply moisturizing lotion as directed Topical Primary Dressing Maxorb Extra Calcium Alginate, 2x2 (in/in) Discharge Instruction: Apply to wound bed as instructed Secondary Dressing ABD Pad, 8x10 Discharge Instruction: Apply over primary dressing as directed. Secured With Compression Wrap Urgo K2 Lite, (equivalent to a 3 layer) two layer compression system, regular Discharge Instruction: Apply Urgo K2 Lite as directed (alternative to 3 layer compression). Compression Stockings Add-Ons Electronic Signature(s) Signed: 05/02/2023 9:49:02 AM By: Duanne Guess MD FACS Entered By: Duanne Guess on 05/02/2023 09:49:02 -------------------------------------------------------------------------------- [name] Assessment Details Patient Name: Date of Service: [name], [name] 05/02/2023 8:15 A M Medical Record Number: [id number] Patient Account Number: [id number] Date of Birth/Sex: Treating RN: 07-08-1955 (67 y.o. [name] Primary Care Gwendola Hornaday: [name] Other Clinician: MAO, HAMON ([id number]) [id number] Page 6 of 9 Referring Tauri Ethington: Treating Shanice Poznanski/Extender: [name], [name] in Treatment: 0 Active Problems Location of [name] Severity and Description of [name] Patient Has Paino No Site Locations [name] Management and Medication Current [name] Management: Electronic Signature(s) Signed:  05/02/2023 12:21:02 PM By: [name] Schwalbe RN Entered By: [name] on 05/02/2023 08:58:10 -------------------------------------------------------------------------------- Wound Assessment Details Patient Name: Date of Service: [name], [name] 05/02/2023 8:15 A M Medical Record Number: [id number] Patient Account Number: [id number] Date of Birth/Sex: Treating RN: 01/29/1956 (67 y.o. [name] Primary Care Chavis Tessler: [name] Other Clinician: Referring Salah Burlison: Treating Trena Dunavan/Extender: [name], [name] in Treatment: 0 Wound Status Wound Number: 8 Primary Venous Leg Ulcer Etiology: Wound Location: Right, Proximal, Anterior Lower Leg Wound Open Wounding Event: Gradually Appeared Status: Date Acquired: 03/27/2023 Comorbid Cataracts, Anemia, Sleep Apnea, Arrhythmia, Coronary Artery [name] Of Treatment: 0 History: Disease, Hypertension, Peripheral Venous Disease, Type II Clustered Wound: No Diabetes, Osteoarthritis, Neuropathy Photos [name], [name] ([id number]) [id number] Page 7 of 9 Wound Measurements Length: (cm) 1 Width: (cm) 1 Depth: (cm) 0.1 Area: (cm) 0.785 Volume: (cm) 0.079 % Reduction in Area: % Reduction in Volume: Epithelialization: None Tunneling: No Undermining: No Wound Description Classification: Unclassifiable Wound Margin: Distinct, outline attached Exudate Amount: None Present Foul Odor After Cleansing: No Slough/Fibrino No Wound Bed Necrotic Amount: Large (67-100%) Exposed Structure Necrotic Quality: Eschar Fascia Exposed: No Fat Layer (Subcutaneous Tissue) Exposed: No Tendon Exposed: No Muscle Exposed: No Joint Exposed: No Bone Exposed: No Periwound Skin Texture Texture Color No Abnormalities Noted: No No Abnormalities Noted: No Callus: No Atrophie Blanche: No Crepitus: No Cyanosis: No Excoriation: No Ecchymosis: No Induration: No Erythema: No Rash: No Hemosiderin Staining:  No Scarring: No Mottled: No Pallor: No Moisture Rubor: No No Abnormalities Noted: No Dry / Scaly: No Maceration: No Treatment Notes Wound #8 (Lower Leg) Wound Laterality: Right, Anterior, Proximal Cleanser Peri-Wound Care Sween Lotion (Moisturizing lotion) Discharge Instruction: Apply moisturizing lotion as directed Topical Primary Dressing Maxorb Extra Calcium Alginate, 2x2 (in/in) Discharge Instruction: Apply to wound bed as instructed Secondary Dressing ABD Pad, 8x10  Discharge Instruction: Apply over primary dressing as directed. Secured With Compression Wrap Urgo K2 Lite, (equivalent to a 3 layer) two layer compression system, regular Discharge Instruction: Apply Urgo K2 Lite as directed (alternative to 3 layer compression). Compression Stockings Add-Ons Electronic Signature(s) Signed: 05/02/2023 11:33:41 AM By: Shawn Stall RN, BSN Signed: 05/02/2023 12:21:02 PM By: [name] Schwalbe RN Entered By: Shawn Stall on 05/02/2023 09:03:30 [name], [name] ([id number]) [id number] Page 8 of 9 -------------------------------------------------------------------------------- Wound Assessment Details Patient Name: Date of Service: [name], [name] 05/02/2023 8:15 A M Medical Record Number: [id number] Patient Account Number: [id number] Date of Birth/Sex: Treating RN: [birth year] (67 y.o. [name] Primary Care Davione Lenker: [name] Other Clinician: Referring Rahkim Rabalais: Treating Slyvia Lartigue/Extender: [name], [name] in Treatment: 0 Wound Status Wound Number: 9 Primary Venous Leg Ulcer Etiology: Wound Location: Left, Distal, Anterior Lower Leg Secondary Diabetic Wound/Ulcer of the Lower Extremity Wounding Event: Trauma Etiology: Date Acquired: 04/10/2023 Wound Open [name] Of Treatment: 0 Status: Clustered Wound: No Comorbid Cataracts, Anemia, Sleep Apnea, Arrhythmia, Coronary Artery History: Disease, Hypertension,  Peripheral Venous Disease, Type II Diabetes, Osteoarthritis, Neuropathy Photos Wound Measurements Length: (cm) Width: (cm) Depth: (cm) Area: (cm) Volume: (cm) 2.3 % Reduction in Area: 1.8 % Reduction in Volume: 0.1 Epithelialization: Small (1-33%) 3.252 Tunneling: No 0.325 Undermining: No Wound Description Classification: Unclassifiable Wound Margin: Distinct, outline attached Exudate Amount: Medium Exudate Type: Serosanguineous Exudate Color: red, brown Foul Odor After Cleansing: No Slough/Fibrino Yes Wound Bed Granulation Amount: None Present (0%) Exposed Structure Necrotic Amount: Large (67-100%) Fascia Exposed: No Necrotic Quality: Eschar, Adherent Slough Fat Layer (Subcutaneous Tissue) Exposed: No Tendon Exposed: No Muscle Exposed: No Joint Exposed: No Bone Exposed: No Periwound Skin Texture Texture Color No Abnormalities Noted: No No Abnormalities Noted: No Callus: No Atrophie Blanche: No Crepitus: No Cyanosis: No Excoriation: No Ecchymosis: No Induration: No Erythema: Yes Rash: No Erythema Location: Circumferential Scarring: No Hemosiderin Staining: No Mottled: No Moisture Pallor: No No Abnormalities Noted: No Rubor: No Dry / Scaly: No Maceration: No Temperature / [name] Temperature: No Abnormality [name], Jamesyn [name] ([id number]) [id number] Page 9 of 9 Electronic Signature(s) Signed: 05/02/2023 11:33:41 AM By: Shawn Stall RN, BSN Signed: 05/02/2023 12:21:02 PM By: [name] Schwalbe RN Entered By: Shawn Stall on 05/02/2023 09:03:48 -------------------------------------------------------------------------------- Vitals Details Patient Name: Date of Service: [name] 05/02/2023 8:15 A M Medical Record Number: [id number] Patient Account Number: [id number] Date of Birth/Sex: Treating RN: 11-28-1955 (67 y.o. [name] Primary Care Ishmeal Rorie: [name] Other Clinician: Referring Mehak Roskelley: Treating  Alizeh Madril/Extender: [name], [name] in Treatment: 0 Vital Signs Time Taken: 08:35 Temperature (F): 97.5 Height (in): 70 Pulse (bpm): 71 Weight (lbs): 166 Respiratory Rate (breaths/min): 16 Body Mass Index (BMI): 23.8 Blood Pressure (mmHg): 137/72 Reference Range: 80 - 120 mg / dl Electronic Signature(s) Signed: 05/02/2023 12:21:02 PM By: [name] Schwalbe RN Entered By: [name] on 05/02/2023 08:35:59

## 2023-05-02 NOTE — Progress Notes (Signed)
SIMONA, OVERMAN ([id number]) [phone] [id number] Page 1 of 4 Visit Report for 05/02/2023 Abuse Risk Screen Details Patient Name: Date of Service: [name], [name] 05/02/2023 8:15 A M Medical Record Number: [id number] Patient Account Number: [id number] Date of Birth/Sex: Treating RN: 25-Oct-1955 (67 y.o. Katrinka Blazing Primary Care Braeton Wolgamott: Alfredo Martinez Other Clinician: Referring Cyndi Montejano: Treating Haille Pardi/Extender: Dan Europe, [name] in Treatment: 0 Abuse Risk Screen Items Answer ABUSE RISK SCREEN: Has anyone close to you tried to hurt or harm you recentlyo No Do you feel uncomfortable with anyone in your familyo No Has anyone forced you do things that you didnt want to doo No Electronic Signature(s) Signed: 05/02/2023 12:21:02 PM By: Karie Schwalbe RN Entered By: Karie Schwalbe on 05/02/2023 08:42:22 -------------------------------------------------------------------------------- Activities of Daily Living Details Patient Name: Date of Service: [name], [name] 05/02/2023 8:15 A M Medical Record Number: [id number] Patient Account Number: [id number] Date of Birth/Sex: Treating RN: May 01, 1956 (67 y.o. Katrinka Blazing Primary Care Mirza Fessel: Alfredo Martinez Other Clinician: Referring Qiana Landgrebe: Treating Nupur Hohman/Extender: Dan Europe, [name] in Treatment: 0 Activities of Daily Living Items Answer Activities of Daily Living (Please select one for each item) Drive Automobile Completely Able T Medications ake Completely Able Use T elephone Completely Able Care for Appearance Completely Able Use T oilet Completely Able Bath / Shower Completely Able Dress Self Completely Able Feed Self Completely Able Walk Completely Able Get In / Out Bed Completely Able Housework Completely Able Prepare Meals Completely Able Handle Money Completely Able Shop for Self Completely Able Electronic Signature(s) Signed: 05/02/2023  12:21:02 PM By: Karie Schwalbe RN Entered By: Karie Schwalbe on 05/02/2023 08:42:57 Peri, Natoshia L ([id number]) [id number] Page 2 of 4 -------------------------------------------------------------------------------- Education Screening Details Patient Name: Date of Service: [name], [name] 05/02/2023 8:15 A M Medical Record Number: [id number] Patient Account Number: [id number] Date of Birth/Sex: Treating RN: 1956/04/01 (67 y.o. Katrinka Blazing Primary Care Nekesha Font: Alfredo Martinez Other Clinician: Referring Shandrea Lusk: Treating Lorelai Huyser/Extender: Dan Europe, Jake Samples in Treatment: 0 Primary Learner Assessed: Patient Learning Preferences/Education Level/Primary Language Learning Preference: Explanation, Demonstration, Printed Material Highest Education Level: College or Above Preferred Language: English Cognitive Barrier Language Barrier: No Translator Needed: No Memory Deficit: No Emotional Barrier: No Cultural/Religious Beliefs Affecting Medical Care: No Physical Barrier Impaired Vision: No Impaired Hearing: No Decreased Hand dexterity: No Knowledge/Comprehension Knowledge Level: High Comprehension Level: High Ability to understand written instructions: High Ability to understand verbal instructions: High Motivation Anxiety Level: Calm Cooperation: Cooperative Education Importance: Acknowledges Need Interest in Health Problems: Asks Questions Perception: Coherent Willingness to Engage in Self-Management High Activities: Readiness to Engage in Self-Management High Activities: Electronic Signature(s) Signed: 05/02/2023 12:21:02 PM By: Karie Schwalbe RN Entered By: Karie Schwalbe on 05/02/2023 08:43:42 -------------------------------------------------------------------------------- Fall Risk Assessment Details Patient Name: Date of Service: [name] 05/02/2023 8:15 A M Medical Record Number: [id number] Patient  Account Number: [id number] Date of Birth/Sex: Treating RN: 1956/05/23 (67 y.o. Katrinka Blazing Primary Care Carmalita Wakefield: Alfredo Martinez Other Clinician: Referring Nerine Pulse: Treating Audriana Aldama/Extender: Dan Europe, [name] in Treatment: 0 Fall Risk Assessment Items Have you had 2 or more falls in the last 12 monthso 0 Yes Minami, Harue L ([id number]) [id number] Page 3 of 4 Have you had any fall that resulted in injury in the last 12 monthso 0 Yes FALLS RISK SCREEN History of falling - immediate or within 3 months 25 Yes Secondary diagnosis (Do you have 2 or more medical diagnoseso) 0 No Ambulatory aid None/bed rest/wheelchair/nurse 0  No Crutches/cane/walker 15 Yes Furniture 0 No Intravenous therapy Access/Saline/Heparin Lock 0 No Gait/Transferring Normal/ bed rest/ wheelchair 0 No Weak (short steps with or without shuffle, stooped but able to lift head while walking, may seek 0 No support from furniture) Impaired (short steps with shuffle, may have difficulty arising from chair, head down, impaired 0 No balance) Mental Status Oriented to own ability 0 No Electronic Signature(s) Signed: 05/02/2023 12:21:02 PM By: Karie Schwalbe RN Entered By: Karie Schwalbe on 05/02/2023 08:44:39 -------------------------------------------------------------------------------- Foot Assessment Details Patient Name: Date of Service: [name] 05/02/2023 8:15 A M Medical Record Number: [id number] Patient Account Number: [id number] Date of Birth/Sex: Treating RN: January 09, 1956 (67 y.o. Katrinka Blazing Primary Care Lanita Stammen: Alfredo Martinez Other Clinician: Referring Madeeha Costantino: Treating Patsie Mccardle/Extender: Dan Europe, [name] in Treatment: 0 Foot Assessment Items Site Locations + = Sensation present, - = Sensation absent, C = Callus, U = Ulcer R = Redness, W = Warmth, M = Maceration, PU = Pre-ulcerative lesion F = Fissure, S =  Swelling, D = Dryness Assessment Right: Left: Other Deformity: No No Prior Foot Ulcer: No No Prior Amputation: No No Charcot Joint: No No Ambulatory Status: Ambulatory With Help Assistance Device: AMALA, LAKATOS ([id number]) [id number] Page 4 of 4 Gait: Steady Electronic Signature(s) Signed: 05/02/2023 12:21:02 PM By: Karie Schwalbe RN Entered By: Karie Schwalbe on 05/02/2023 08:46:20 -------------------------------------------------------------------------------- Nutrition Risk Screening Details Patient Name: Date of Service: [name], [name] 05/02/2023 8:15 A M Medical Record Number: [id number] Patient Account Number: [id number] Date of Birth/Sex: Treating RN: [birth year] (67 y.o. Katrinka Blazing Primary Care Matraca Hunkins: Alfredo Martinez Other Clinician: Referring Shirely Toren: Treating Rosalina Dingwall/Extender: Dan Europe, [name] in Treatment: 0 Height (in): 70 Weight (lbs): 166 Body Mass Index (BMI): 23.8 Nutrition Risk Screening Items Score Screening NUTRITION RISK SCREEN: I have an illness or condition that made me change the kind and/or amount of food I eat 0 No I eat fewer than two meals per day 0 No I eat few fruits and vegetables, or milk products 0 No I have three or more drinks of beer, liquor or wine almost every day 0 No I have tooth or mouth problems that make it hard for me to eat 0 No I don't always have enough money to buy the food I need 0 No I eat alone most of the time 0 No I take three or more different prescribed or over-the-counter drugs a day 1 Yes Without wanting to, I have lost or gained 10 pounds in the last six months 0 No I am not always physically able to shop, cook and/or feed myself 0 No Nutrition Protocols Good Risk Protocol 0 No interventions needed Moderate Risk Protocol High Risk Proctocol Risk Level: Good Risk Score: 1 Electronic Signature(s) Signed: 05/02/2023 12:21:02 PM By: Karie Schwalbe RN Entered By: Karie Schwalbe on 05/02/2023 08:45:02

## 2023-05-02 NOTE — Progress Notes (Addendum)
TING, FRISCHMAN ([id number]) [id number] Page 1 of 10 Visit Report for 05/02/2023 Chief Complaint Document Details Patient Name: Date of Service: [name], [name] 05/02/2023 8:15 A M Medical Record Number: [id number] Patient Account Number: [id number] Date of Birth/Sex: Treating RN: 08-02-1955 (67 y.o. F) Primary Care Provider: Alfredo Martinez Other Clinician: Referring Provider: Treating Provider/Extender: Dan Europe, [name] in Treatment: 0 Information Obtained from: Patient Chief Complaint Patient presents for treatment of an open ulcer due to venous insufficiency Electronic Signature(s) Signed: 05/02/2023 9:49:13 AM By: Duanne Guess MD FACS Previous Signature: 05/02/2023 8:47:02 AM Version By: Duanne Guess MD FACS Entered By: Duanne Guess on 05/02/2023 06:49:13 -------------------------------------------------------------------------------- Debridement Details Patient Name: Date of Service: [name] 05/02/2023 8:15 A M Medical Record Number: [id number] Patient Account Number: [id number] Date of Birth/Sex: Treating RN: [birth year] (67 y.o. [name] Primary Care Provider: Alfredo Martinez Other Clinician: Referring Provider: Treating Provider/Extender: Dan Europe, [name] in Treatment: 0 Debridement Performed for Assessment: Wound #8 Right,Proximal,Anterior Lower Leg Performed By: Physician Duanne Guess, MD The following information was scribed by: Zenaida Deed The information was scribed for: Duanne Guess Debridement Type: Debridement Severity of Tissue Pre Debridement: Fat layer exposed Level of Consciousness (Pre-procedure): Awake and Alert Pre-procedure Verification/Time Out Yes - 09:05 Taken: Start Time: 09:07 Pain Control: Lidocaine 4% T opical Solution Percent of Wound Bed Debrided: 100% T Area Debrided (cm): otal 0.78 Tissue and other material debrided: Viable, Non-Viable,  Eschar, Slough, Subcutaneous, Slough Level: Skin/Subcutaneous Tissue Debridement Description: Excisional Instrument: Curette Bleeding: Minimum Hemostasis Achieved: Pressure Procedural Pain: 7 Post Procedural Pain: 5 Response to Treatment: Procedure was tolerated well Level of Consciousness (Post- Awake and Alert procedure): Post Debridement Measurements of Total Wound Length: (cm) 1 Width: (cm) 1 Depth: (cm) 0.1 Cooner, Rikia [name] ([id number]) [id number] Page 2 of 10 Volume: (cm) 0.079 Character of Wound/Ulcer Post Debridement: Improved Severity of Tissue Post Debridement: Fat layer exposed Post Procedure Diagnosis Same as Pre-procedure Electronic Signature(s) Signed: 05/02/2023 11:04:04 AM By: Duanne Guess MD FACS Signed: 05/02/2023 1:08:45 PM By: Zenaida Deed RN, BSN Entered By: Zenaida Deed on 05/02/2023 06:11:44 -------------------------------------------------------------------------------- Debridement Details Patient Name: Date of Service: [name] 05/02/2023 8:15 A M Medical Record Number: [id number] Patient Account Number: [id number] Date of Birth/Sex: Treating RN: 09-15-1955 (67 y.o. [name] Primary Care Provider: Alfredo Martinez Other Clinician: Referring Provider: Treating Provider/Extender: Dan Europe, [name] in Treatment: 0 Debridement Performed for Assessment: Wound #9 Right,Distal,Anterior Lower Leg Performed By: Physician Duanne Guess, MD The following information was scribed by: Zenaida Deed The information was scribed for: Duanne Guess Debridement Type: Debridement Severity of Tissue Pre Debridement: Fat layer exposed Level of Consciousness (Pre-procedure): Awake and Alert Pre-procedure Verification/Time Out Yes - 09:05 Taken: Start Time: 09:07 Pain Control: Lidocaine 4% T opical Solution Percent of Wound Bed Debrided: 100% T Area Debrided (cm): otal 3.25 Tissue and other  material debrided: Viable, Non-Viable, Eschar, Slough, Subcutaneous, Slough Level: Skin/Subcutaneous Tissue Debridement Description: Excisional Instrument: Curette Bleeding: Minimum Hemostasis Achieved: Pressure Procedural Pain: 7 Post Procedural Pain: 5 Response to Treatment: Procedure was tolerated well Level of Consciousness (Post- Awake and Alert procedure): Post Debridement Measurements of Total Wound Length: (cm) 2.3 Width: (cm) 1.8 Depth: (cm) 0.1 Volume: (cm) 0.325 Character of Wound/Ulcer Post Debridement: Improved Severity of Tissue Post Debridement: Fat layer exposed Post Procedure Diagnosis Same as Pre-procedure Electronic Signature(s) Signed: 05/02/2023 11:04:04 AM By: Duanne Guess MD FACS Signed: 05/02/2023 1:08:45 PM By: Zenaida Deed RN, BSN Entered By:  Zenaida Deed on 05/02/2023 06:13:27 ASAMI, CHIMA ([id number]) [id number] Page 3 of 10 -------------------------------------------------------------------------------- HPI Details Patient Name: Date of Service: [name], [name] 05/02/2023 8:15 A M Medical Record Number: [id number] Patient Account Number: [id number] Date of Birth/Sex: Treating RN: [birth year] (67 y.o. F) Primary Care Provider: Alfredo Martinez Other Clinician: Referring Provider: Treating Provider/Extender: Dan Europe, [name] in Treatment: 0 History of Present Illness HPI Description: ADMISSION 05/02/2023 ***ABIs R: 0.88, palpable pulses*** This is a 67 year old woman with well-controlled type 2 diabetes (last hemoglobin A1c 5.4%) presenting to the wound care center with two ulcers on her right lower leg. She saw her primary care provider earlier this week and per their note, she has had lower leg swelling and erythema for about 2 months. Her PCP prescribed Augmentin and doxycycline. Electronic Signature(s) Signed: 05/02/2023 9:49:47 AM By: Duanne Guess MD FACS Previous Signature: 05/02/2023  8:49:04 AM Version By: Duanne Guess MD FACS Entered By: Duanne Guess on 05/02/2023 06:49:47 -------------------------------------------------------------------------------- Physical Exam Details Patient Name: Date of Service: [name], [name] 05/02/2023 8:15 A M Medical Record Number: [id number] Patient Account Number: [id number] Date of Birth/Sex: Treating RN: [birth year] (67 y.o. F) Primary Care Provider: Alfredo Martinez Other Clinician: Referring Provider: Treating Provider/Extender: Dan Europe, [name] in Treatment: 0 Constitutional . . . . No acute distress. Respiratory Normal work of breathing on room air. Cardiovascular Palpable dorsalis pedis. Notes 05/02/2023: On the patient's right anterior tibial surface, there are 2 wounds. The more distal of the 2 has a bit of hematoma associated with it. Both have eschar covering the surfaces. The periwound surrounding the 2 sites is a bit erythematous. There is no malodor or purulent drainage detected. Electronic Signature(s) Signed: 05/02/2023 9:55:30 AM By: Duanne Guess MD FACS Previous Signature: 05/02/2023 9:50:59 AM Version By: Duanne Guess MD FACS Entered By: Duanne Guess on 05/02/2023 06:55:29 Physician Orders Details -------------------------------------------------------------------------------- [name] ([id number]) [id number] Page 4 of 10 Patient Name: Date of Service: [name], [name] 05/02/2023 8:15 A M Medical Record Number: [id number] Patient Account Number: [id number] Date of Birth/Sex: Treating RN: 07/10/1955 (67 y.o. Billy Coast, [name] Primary Care Provider: Alfredo Martinez Other Clinician: Referring Provider: Treating Provider/Extender: Dan Europe, [name] in Treatment: 0 The following information was scribed by: Zenaida Deed The information was scribed for: Duanne Guess Verbal / Phone Orders: No Diagnosis Coding ICD-10  Coding Code Description [phone] Non-pressure chronic ulcer of other part of right lower leg with fat layer exposed E11.622 Type 2 diabetes mellitus with other skin ulcer R60.0 Localized edema Follow-up Appointments ppointment in 1 week. - Dr. Lady Gary RM 2 Return A Monday 11/18 @ 10:00 am Anesthetic Wound #8 Right,Proximal,Anterior Lower Leg (In clinic) Topical Lidocaine 4% applied to wound bed Wound #9 Right,Distal,Anterior Lower Leg (In clinic) Topical Lidocaine 4% applied to wound bed Bathing/ Shower/ Hygiene May shower with protection but do not get wound dressing(s) wet. Protect dressing(s) with water repellant cover (for example, large plastic bag) or a cast cover and may then take shower. Edema Control - Orders / Instructions Elevate legs to the level of the heart or above for 30 minutes daily and/or when sitting for 3-4 times a day throughout the day. Avoid standing for long periods of time. Exercise regularly Wound Treatment Wound #8 - Lower Leg Wound Laterality: Right, Anterior, Proximal Peri-Wound Care: Sween Lotion (Moisturizing lotion) 1 x Per Week/30 Days Discharge Instructions: Apply moisturizing lotion as directed Prim Dressing: Maxorb Extra Ag+ Alginate Dressing, 2x2 (in/in) 1 x Per Week/30  Days ary Discharge Instructions: Apply to wound bed as instructed Secondary Dressing: ABD Pad, 8x10 1 x Per Week/30 Days Discharge Instructions: Apply over primary dressing as directed. Compression Wrap: Urgo K2 Lite, (equivalent to a 3 layer) two layer compression system, regular 1 x Per Week/30 Days Discharge Instructions: Apply Urgo K2 Lite as directed (alternative to 3 layer compression). Wound #9 - Lower Leg Wound Laterality: Right, Anterior, Distal Peri-Wound Care: Sween Lotion (Moisturizing lotion) 1 x Per Week/30 Days Discharge Instructions: Apply moisturizing lotion as directed Prim Dressing: Maxorb Extra Calcium Alginate, 2x2 (in/in) 1 x Per Week/30 Days ary Discharge  Instructions: Apply to wound bed as instructed Secondary Dressing: ABD Pad, 8x10 1 x Per Week/30 Days Discharge Instructions: Apply over primary dressing as directed. Compression Wrap: Urgo K2 Lite, (equivalent to a 3 layer) two layer compression system, regular 1 x Per Week/30 Days Discharge Instructions: Apply Urgo K2 Lite as directed (alternative to 3 layer compression). Electronic Signature(s) Signed: 05/02/2023 11:04:04 AM By: Duanne Guess MD FACS Entered By: Duanne Guess on 05/02/2023 06:56:54 Vasseur, Leotis Pain ([id number]) [id number] Page 5 of 10 -------------------------------------------------------------------------------- Problem List Details Patient Name: Date of Service: ALESSIA, LOJEWSKI 05/02/2023 8:15 A M Medical Record Number: [id number] Patient Account Number: [id number] Date of Birth/Sex: Treating RN: 12/03/1955 (67 y.o. F) Primary Care Provider: Alfredo Martinez Other Clinician: Referring Provider: Treating Provider/Extender: Dan Europe, [name] in Treatment: 0 Active Problems ICD-10 Encounter Code Description Active Date MDM Diagnosis L97.812 Non-pressure chronic ulcer of other part of right lower leg with fat layer 05/02/2023 No Yes exposed E11.622 Type 2 diabetes mellitus with other skin ulcer 05/02/2023 No Yes R60.0 Localized edema 05/02/2023 No Yes Inactive Problems Resolved Problems Electronic Signature(s) Signed: 05/02/2023 9:48:55 AM By: Duanne Guess MD FACS Previous Signature: 05/02/2023 8:46:40 AM Version By: Duanne Guess MD FACS Entered By: Duanne Guess on 05/02/2023 06:48:55 -------------------------------------------------------------------------------- Progress Note Details Patient Name: Date of Service: [name] 05/02/2023 8:15 A M Medical Record Number: [id number] Patient Account Number: [id number] Date of Birth/Sex: Treating RN: 08-15-1955 (67 y.o. F) Primary Care Provider: Alfredo Martinez Other Clinician: Referring Provider: Treating Provider/Extender: Dan Europe, [name] in Treatment: 0 Subjective Chief Complaint Information obtained from Patient Patient presents for treatment of an open ulcer due to venous insufficiency History of Present Illness (HPI) ADMISSION 05/02/2023 ***ABIs R: 0.88, palpable pulses*** This is a 67 year old woman with well-controlled type 2 diabetes (last hemoglobin A1c 5.4%) presenting to the wound care center with two ulcers on her right lower leg. She saw her primary care provider earlier this week and per their note, she has had lower leg swelling and erythema for about 2 months. Her PCP prescribed Augmentin and doxycycline. MYLISHA, SANANTONIO ([id number]) [id number] Page 6 of 10 Patient History Information obtained from Patient, Chart. Allergies cephalexin, latex, Westcort (neomycin-polymx-HC), adhesive tape Family History Unknown History. Social History Former smoker - ended on 06/25/1991, Marital Status - Married, Alcohol Use - Never, Drug Use - No History, Caffeine Use - Daily - coffee. Medical History Eyes Patient has history of Cataracts Hematologic/Lymphatic Patient has history of Anemia - Iron deficiency anemia Respiratory Patient has history of Sleep Apnea Cardiovascular Patient has history of Arrhythmia - Paroxysmal atrial fibrillation, Coronary Artery Disease, Hypertension, Peripheral Venous Disease - venous stasis and insufficiency Endocrine Patient has history of Type II Diabetes Musculoskeletal Patient has history of Osteoarthritis Neurologic Patient has history of Neuropathy Hospitalization/Surgery History - Decompressive lumbar laminectomy level 2. - Parathyroidectomy (Left). - Pacemaker leadless insertion. -  T knee otal arthroplasty (Right). - stab phlebectomy (Right). - T knee arthroplasty (Left). - Laparoscopy. - Gastric bypass. - Electrocardiogram. -  Cardiac otal catheterization. - Cataract extraction w/ intraocular lens implant, bilateral. - Colonoscopy. - Flexible sigmoidoscopy. - Leg surgery (Left) Comment: metal and pins in lower left leg. - Multiple tooth extractions. - Shoulder arthroscopy w/ rotator cuff repair (Right). - Tonsillectomy. - Varicose vein surgery. Medical A Surgical History Notes nd Ear/Nose/Mouth/Throat Presbycusis of both ears Cardiovascular Second degree AV block, Pacemaker Gastrointestinal Spigelian hernia, Umbilical hernia, GERD, Rectal prolapse, Rectovaginal fistula, NASH (nonalcoholic steatohepatitis) Endocrine Hyperparathyroidism, GOITER, MULTINODULAR Genitourinary Renal insufficiency Integumentary (Skin) PSORIASIS Psychiatric major depression Objective Constitutional No acute distress. Vitals Time Taken: 8:35 AM, Height: 70 in, Weight: 166 lbs, BMI: 23.8, Temperature: 97.5 F, Pulse: 71 bpm, Respiratory Rate: 16 breaths/min, Blood Pressure: 137/72 mmHg. Respiratory Normal work of breathing on room air. Cardiovascular Palpable dorsalis pedis. General Notes: 05/02/2023: On the patient's right anterior tibial surface, there are 2 wounds. The more distal of the 2 has a bit of hematoma associated with it. Both have eschar covering the surfaces. The periwound surrounding the 2 sites is a bit erythematous. There is no malodor or purulent drainage detected. Integumentary (Hair, Skin) Wound #8 status is Open. Original cause of wound was Gradually Appeared. The date acquired was: 03/27/2023. The wound is located on the Right,Proximal,Anterior Lower Leg. The wound measures 1cm length x 1cm width x 0.1cm depth; 0.785cm^2 area and 0.079cm^3 volume. There is no tunneling or undermining noted. There is a none present amount of drainage noted. The wound margin is distinct with the outline attached to the wound base. There is a large (67-100%) amount of necrotic tissue within the wound bed including Eschar. The  periwound skin appearance did not exhibit: Callus, Crepitus, Excoriation, Induration, Rash, Scarring, Dry/Scaly, Maceration, Atrophie Blanche, Cyanosis, Ecchymosis, Hemosiderin Staining, Mottled, Pallor, Rubor, Erythema. Wound #9 status is Open. Original cause of wound was Trauma. The date acquired was: 04/10/2023. The wound is located on the Right,Distal,Anterior Lower Leg. The wound measures 2.3cm length x 1.8cm width x 0.1cm depth; 3.252cm^2 area and 0.325cm^3 volume. There is no tunneling or undermining noted. There is a medium amount of serosanguineous drainage noted. The wound margin is distinct with the outline attached to the wound base. There is no granulation within the wound bed. There is a large (67-100%) amount of necrotic tissue within the wound bed including Eschar and Adherent Slough. The periwound skin Folger, Robena [name] [id number]) [id number] Page 7 of 10 appearance exhibited: Erythema. The periwound skin appearance did not exhibit: Callus, Crepitus, Excoriation, Induration, Rash, Scarring, Dry/Scaly, Maceration, Atrophie Blanche, Cyanosis, Ecchymosis, Hemosiderin Staining, Mottled, Pallor, Rubor. The surrounding wound skin color is noted with erythema which is circumferential. Periwound temperature was noted as No Abnormality. Assessment Active Problems ICD-10 Non-pressure chronic ulcer of other part of right lower leg with fat layer exposed Type 2 diabetes mellitus with other skin ulcer Localized edema Procedures Wound #8 Pre-procedure diagnosis of Wound #8 is a Venous Leg Ulcer located on the Right,Proximal,Anterior Lower Leg .Severity of Tissue Pre Debridement is: Fat layer exposed. There was a Excisional Skin/Subcutaneous Tissue Debridement with a total area of 0.78 sq cm performed by Duanne Guess, MD. With the following instrument(s): Curette to remove Viable and Non-Viable tissue/material. Material removed includes Eschar, Subcutaneous Tissue,  and Slough after achieving pain control using Lidocaine 4% Topical Solution. No specimens were taken. A time out was conducted at 09:05, prior to the start of the procedure.  A Minimum amount of bleeding was controlled with Pressure. The procedure was tolerated well with a pain level of 7 throughout and a pain level of 5 following the procedure. Post Debridement Measurements: 1cm length x 1cm width x 0.1cm depth; 0.079cm^3 volume. Character of Wound/Ulcer Post Debridement is improved. Severity of Tissue Post Debridement is: Fat layer exposed. Post procedure Diagnosis Wound #8: Same as Pre-Procedure Pre-procedure diagnosis of Wound #8 is a Venous Leg Ulcer located on the Right,Proximal,Anterior Lower Leg . There was a Double Layer Compression Therapy Procedure by Zenaida Deed, RN. Post procedure Diagnosis Wound #8: Same as Pre-Procedure Notes: urgo lite. Wound #9 Pre-procedure diagnosis of Wound #9 is a Venous Leg Ulcer located on the Right,Distal,Anterior Lower Leg .Severity of Tissue Pre Debridement is: Fat layer exposed. There was a Excisional Skin/Subcutaneous Tissue Debridement with a total area of 3.25 sq cm performed by Duanne Guess, MD. With the following instrument(s): Curette to remove Viable and Non-Viable tissue/material. Material removed includes Eschar, Subcutaneous Tissue, and Slough after achieving pain control using Lidocaine 4% Topical Solution. No specimens were taken. A time out was conducted at 09:05, prior to the start of the procedure. A Minimum amount of bleeding was controlled with Pressure. The procedure was tolerated well with a pain level of 7 throughout and a pain level of 5 following the procedure. Post Debridement Measurements: 2.3cm length x 1.8cm width x 0.1cm depth; 0.325cm^3 volume. Character of Wound/Ulcer Post Debridement is improved. Severity of Tissue Post Debridement is: Fat layer exposed. Post procedure Diagnosis Wound #9: Same as  Pre-Procedure Plan Follow-up Appointments: Return Appointment in 1 week. - Dr. Lady Gary RM 2 Monday 11/18 @ 10:00 am Anesthetic: Wound #8 Right,Proximal,Anterior Lower Leg: (In clinic) Topical Lidocaine 4% applied to wound bed Wound #9 Right,Distal,Anterior Lower Leg: (In clinic) Topical Lidocaine 4% applied to wound bed Bathing/ Shower/ Hygiene: May shower with protection but do not get wound dressing(s) wet. Protect dressing(s) with water repellant cover (for example, large plastic bag) or a cast cover and may then take shower. Edema Control - Orders / Instructions: Elevate legs to the level of the heart or above for 30 minutes daily and/or when sitting for 3-4 times a day throughout the day. Avoid standing for long periods of time. Exercise regularly WOUND #8: - Lower Leg Wound Laterality: Right, Anterior, Proximal Peri-Wound Care: Sween Lotion (Moisturizing lotion) 1 x Per Week/30 Days Discharge Instructions: Apply moisturizing lotion as directed Prim Dressing: Maxorb Extra Ag+ Alginate Dressing, 2x2 (in/in) 1 x Per Week/30 Days ary Discharge Instructions: Apply to wound bed as instructed Secondary Dressing: ABD Pad, 8x10 1 x Per Week/30 Days Discharge Instructions: Apply over primary dressing as directed. Com pression Wrap: Urgo K2 Lite, (equivalent to a 3 layer) two layer compression system, regular 1 x Per Week/30 Days Discharge Instructions: Apply Urgo K2 Lite as directed (alternative to 3 layer compression). WOUND #9: - Lower Leg Wound Laterality: Right, Anterior, Distal Peri-Wound Care: Sween Lotion (Moisturizing lotion) 1 x Per Week/30 Days Discharge Instructions: Apply moisturizing lotion as directed Prim Dressing: Maxorb Extra Calcium Alginate, 2x2 (in/in) 1 x Per Week/30 Days ary Discharge Instructions: Apply to wound bed as instructed Secondary Dressing: ABD Pad, 8x10 1 x Per Week/30 Days Discharge Instructions: Apply over primary dressing as directed. Com pression  Wrap: Urgo K2 Lite, (equivalent to a 3 layer) two layer compression system, regular 1 x Per Week/30 Days Discharge Instructions: Apply Urgo K2 Lite as directed (alternative to 3 layer compression). [name], [name] ([id number]) [id number]  Page 8 of 10 05/02/2023: This is a well-controlled diabetic with venous insufficiency presenting with right lower leg wounds. On the patient's right anterior tibial surface, there are 2 wounds. The more distal of the 2 has a bit of hematoma associated with it. Both have eschar covering the surfaces. The periwound surrounding the 2 sites is a bit erythematous. There is no malodor or purulent drainage detected. I used a curette to debride eschar, slough, and subcutaneous tissue from each of her wounds. We will apply silver alginate and an Urgo lite compression wrap. The importance of leg elevation and adequate protein intake were discussed. She will follow-up in 1 week. Electronic Signature(s) Signed: 05/02/2023 9:58:23 AM By: Duanne Guess MD FACS Entered By: Duanne Guess on 05/02/2023 06:58:23 -------------------------------------------------------------------------------- HxROS Details Patient Name: Date of Service: [name] 05/02/2023 8:15 A M Medical Record Number: [id number] Patient Account Number: [id number] Date of Birth/Sex: Treating RN: 10-03-1955 (67 y.o. Fredderick Phenix Primary Care Provider: Alfredo Martinez Other Clinician: Referring Provider: Treating Provider/Extender: Dan Europe, [name] in Treatment: 0 Information Obtained From Patient Chart Eyes Medical History: Positive for: Cataracts Ear/Nose/Mouth/Throat Medical History: Past Medical History Notes: Presbycusis of both ears Hematologic/Lymphatic Medical History: Positive for: Anemia - Iron deficiency anemia Respiratory Medical History: Positive for: Sleep Apnea Cardiovascular Medical History: Positive for: Arrhythmia  - Paroxysmal atrial fibrillation; Coronary Artery Disease; Hypertension; Peripheral Venous Disease - venous stasis and insufficiency Past Medical History Notes: Second degree AV block, Pacemaker Gastrointestinal Medical History: Past Medical History Notes: Spigelian hernia, Umbilical hernia, GERD, Rectal prolapse, Rectovaginal fistula, NASH (nonalcoholic steatohepatitis) Endocrine Medical History: Positive for: Type II Diabetes Past Medical History Notes: Hyperparathyroidism, GOITER, MULTINODULAR Time with diabetes: 28 years [name], FELTMAN [name] ([id number]) [id number] Page 9 of 10 Genitourinary Medical History: Past Medical History Notes: Renal insufficiency Integumentary (Skin) Medical History: Past Medical History Notes: PSORIASIS Musculoskeletal Medical History: Positive for: Osteoarthritis Neurologic Medical History: Positive for: Neuropathy Psychiatric Medical History: Past Medical History Notes: major depression HBO Extended History Items Eyes: Cataracts Immunizations Pneumococcal Vaccine: Received Pneumococcal Vaccination: No Implantable Devices No devices added Hospitalization / Surgery History Type of Hospitalization/Surgery Decompressive lumbar laminectomy level 2 Parathyroidectomy (Left) Pacemaker leadless insertion T knee arthroplasty (Right) otal stab phlebectomy (Right) T knee arthroplasty (Left) otal Laparoscopy Gastric bypass Electrocardiogram Cardiac catheterization Cataract extraction w/ intraocular lens implant, bilateral Colonoscopy Flexible sigmoidoscopy Leg surgery (Left) Comment: metal and pins in lower left leg Multiple tooth extractions Shoulder arthroscopy w/ rotator cuff repair (Right) Tonsillectomy Varicose vein surgery Family and Social History Unknown History: Yes; Former smoker - ended on 06/25/1991; Marital Status - Married; Alcohol Use: Never; Drug Use: No History; Caffeine Use: Daily - coffee;  Financial Concerns: No; Food, Clothing or Shelter Needs: No; Support System Lacking: No; Transportation Concerns: No Electronic Signature(s) Signed: 05/02/2023 11:04:04 AM By: Duanne Guess MD FACS Signed: 05/02/2023 12:10:28 PM By: Samuella Bruin Signed: 05/02/2023 12:21:02 PM By: Karie Schwalbe RN Entered By: Karie Schwalbe on 05/02/2023 05:42:17 Lebeau, Annelies [name] ([id number]) [id number] Page 10 of 10 -------------------------------------------------------------------------------- SuperBill Details Patient Name: Date of Service: [name], [name] 05/02/2023 Medical Record Number: [id number] Patient Account Number: [id number] Date of Birth/Sex: Treating RN: 28-Dec-1955 (67 y.o. F) Primary Care Provider: Alfredo Martinez Other Clinician: Referring Provider: Treating Provider/Extender: Dan Europe, [name] in Treatment: 0 Diagnosis Coding ICD-10 Codes Code Description [phone] Non-pressure chronic ulcer of other part of right lower leg with fat layer exposed E11.622 Type 2 diabetes mellitus with other skin ulcer R60.0 Localized edema Facility  Procedures : 7 CPT4 Code: 1610960 Description: 99213 - WOUND CARE VISIT-LEV 3 EST PT Modifier: Quantity: 1 : 3 CPT4 Code: 4540981 Description: 11042 - DEB SUBQ TISSUE 20 SQ CM/< ICD-10 Diagnosis Description L97.812 Non-pressure chronic ulcer of other part of right lower leg with fat layer expos Modifier: ed Quantity: 1 Physician Procedures : CPT4 Code Description Modifier [id number] 99204 - WC PHYS LEVEL 4 - NEW PT 25 ICD-10 Diagnosis Description L97.812 Non-pressure chronic ulcer of other part of right lower leg with fat layer exposed E11.622 Type 2 diabetes mellitus with other skin ulcer  R60.0 Localized edema Quantity: 1 : [id number] 11042 - WC PHYS SUBQ TISS 20 SQ CM ICD-10 Diagnosis Description L97.812 Non-pressure chronic ulcer of other part of right lower leg with fat layer exposed Quantity:  1 Electronic Signature(s) Signed: 05/19/2023 12:54:46 PM By: Pearletha Alfred Signed: 05/19/2023 4:39:09 PM By: Duanne Guess MD FACS Previous Signature: 05/02/2023 10:00:30 AM Version By: Duanne Guess MD FACS Entered By: Pearletha Alfred on 05/19/2023 09:54:46

## 2023-05-05 ENCOUNTER — Ambulatory Visit: Payer: Medicare Other | Admitting: Student

## 2023-05-05 NOTE — Telephone Encounter (Signed)
Called and LVM for daughter to call office back.

## 2023-05-08 ENCOUNTER — Ambulatory Visit (INDEPENDENT_AMBULATORY_CARE_PROVIDER_SITE_OTHER): Payer: Medicare Other

## 2023-05-08 ENCOUNTER — Ambulatory Visit: Payer: Medicare Other | Admitting: Orthopedic Surgery

## 2023-05-08 DIAGNOSIS — I441 Atrioventricular block, second degree: Secondary | ICD-10-CM

## 2023-05-08 DIAGNOSIS — Z9889 Other specified postprocedural states: Secondary | ICD-10-CM

## 2023-05-08 LAB — CUP PACEART REMOTE DEVICE CHECK
Battery Remaining Longevity: 96 mo
Battery Voltage: 3.02 V
Brady Statistic RV Percent Paced: 3.44 %
Date Time Interrogation Session: 20241114073400
Implantable Pulse Generator Implant Date: 20210817
Lead Channel Impedance Value: 410 Ohm
Lead Channel Pacing Threshold Amplitude: 0.5 V
Lead Channel Pacing Threshold Pulse Width: 0.4 ms
Lead Channel Sensing Intrinsic Amplitude: 15.3 mV
Lead Channel Setting Pacing Amplitude: 1 V
Lead Channel Setting Pacing Pulse Width: 0.4 ms
Lead Channel Setting Sensing Sensitivity: 2 mV

## 2023-05-08 NOTE — Progress Notes (Signed)
Orthopedic Surgery Post-operative Office Visit   Procedure: L3-5 lumbar laminectomies and foraminotomies Date of Surgery: 11/05/2022 (~6 months post-op)   Assessment: Patient is a 67 y.o. who is doing well after surgery     Plan: -Operative plans complete -Out of bed as tolerated, no brace -No spine specific precautions -Okay to submerge wound -Pain management: OTC medications -Return to office in 6 months, lumbar x-rays needed at next visit: none   ___________________________________________________________________________     Subjective: Patient has been having word finding difficulty and is getting worked up by neurology.  She is otherwise doing well.  She is not having any back pain or pain radiating into her lower extremities.  She is pleased with her surgical outcome so far.  Denies paresthesia numbness.  Has not noticed any redness or drainage around her incision.     Objective:   General: no acute distress, appropriate affect Neurologic: alert, answering questions appropriately, following commands Respiratory: unlabored breathing on room air Skin: incision is well healed   MSK (spine):   -Strength exam                                                   Left                  Right   EHL                              4/5                  3/5 TA                                 5/5                  3/5 GSC                             5/5                  5/5 Knee extension            5/5                  5/5 Hip flexion                    5/5                  5/5   -Sensory exam                           Sensation intact to light touch in L3-S1 nerve distributions of bilateral lower extremities   Imaging: XRs of the lumbar spine from 12/25/2022 were previously independently reviewed and interpreted, showing retrolisthesis of L3 on L4. Disc height loss at multiple levels. Lumbar scoliosis with apex to the right at L3. No fracture or dislocation. No evidence of  instability on flexion/extension. Laminectomy defect seen from L3-5.     Patient name: [name] Patient MRN: [id number] Date of visit: 05/08/23

## 2023-05-09 ENCOUNTER — Encounter: Payer: Self-pay | Admitting: Student

## 2023-05-09 ENCOUNTER — Ambulatory Visit: Payer: Medicare Other | Admitting: Student

## 2023-05-09 VITALS — BP 109/62 | HR 86 | Ht 67.0 in | Wt 163.0 lb

## 2023-05-09 DIAGNOSIS — L03119 Cellulitis of unspecified part of limb: Secondary | ICD-10-CM | POA: Diagnosis not present

## 2023-05-09 DIAGNOSIS — Z7985 Long-term (current) use of injectable non-insulin antidiabetic drugs: Secondary | ICD-10-CM

## 2023-05-09 DIAGNOSIS — E1140 Type 2 diabetes mellitus with diabetic neuropathy, unspecified: Secondary | ICD-10-CM

## 2023-05-09 LAB — POCT GLYCOSYLATED HEMOGLOBIN (HGB A1C): HbA1c, POC (controlled diabetic range): 5.7 % (ref 0.0–7.0)

## 2023-05-09 NOTE — Assessment & Plan Note (Signed)
-   Repeat A1C today

## 2023-05-09 NOTE — Patient Instructions (Signed)
It was great to see you today! Thank you for choosing [facility] for your primary care.  Today we addressed: See wound care on Monday and follow up with [place] in 3 weeks   If you haven't already, sign up for My Chart to have easy access to your labs results, and communication with your primary care physician. We are checking some labs today. If they are abnormal, I will call you. If they are normal, I will send you a MyChart message (if it is active) or a letter in the mail. If you do not hear about your labs in the next 2 weeks, please call the office. I recommend that you always bring your medications to each appointment as this makes it easy to ensure you are on the correct medications and helps [place] not miss refills when you need them. Call the clinic at [phone] if your symptoms worsen or you have any concerns. Call if you have any worsening symptoms  Please arrive 15 minutes before your appointment to ensure smooth check in process.  We appreciate your efforts in making this happen.  Thank you for allowing me to participate in your care, Alfredo Martinez, MD 05/09/2023, 10:56 AM PGY-3, [facility]

## 2023-05-09 NOTE — Progress Notes (Signed)
    SUBJECTIVE:   CHIEF COMPLAINT / HPI:   Cellulitis Right Lower Extremity:  -Presented on 04/28/23 with lower leg swelling, erythema, pain, drainage  -No systemic symptoms -Was given Doxy + Augmentin for treatment of cellulitis and instructed to return  -Reports today that she saw wound care and they wrapped her right lower extremity. - Patient reports some improvement but has not seen her leg since she saw wound care. - She has another appointment with wound care on Monday - Pain is reported as shocklike pain but no worse than previous  PERTINENT  PMH / PSH:  Coronary artery disease Hypertension - Zestoretic and spironolactone  Paroxysmal A-fib obesity with sleep apnea -- Eliquis  GERD short-bowel syndrome h/o bariatric surgery  Hyperparathyroidism Hypothyroidism - TSH 3 mo ago normal  Hyperlipidemia Parathyroid adenoma Peripheral neuropathy Age-related cognitive decline B12 deficiency - on oral supplement  Pacemaker? Major depression Vitamin D deficiency  OBJECTIVE:   BP 109/62   Pulse 86   Ht 5\' 7"  (1.702 m)   Wt 163 lb (73.9 kg)   SpO2 100%   BMI 25.53 kg/m   General: Alert and oriented in no apparent distress Heart: Regular rate and rhythm with no murmurs appreciated Lungs: Normal work of breathing Abdomen:  no abdominal pain Skin: Warm and dry Extremities: Chronic lower extremity edema RLE: Upon removal of the Ace bandage and Telfa on her ulcerations, patient had some bleeding that we were able to control with pressure.  Improved erythema, heat present, swelling decreased.  Healing ulceration x 2, no purulent drainage    ASSESSMENT/PLAN:   Assessment & Plan Cellulitis of lower extremity, unspecified laterality Patient is completing her Augmentin and doxycycline doses, seems to have had improvement of the lower extremity without evidence of infection today.  We were able to stop the bleeding as she is on anticoagulation after removal of the bandages from  wound care with pressure on the area.  We have rewrapped the area with sterile gauze as well as compressing with Ace bandage.  She has follow-up with wound care on Monday.  Overall, seems to have improved since last visit.  No longer has purulent drainage or smell coming from the lower extremity.  Will have her return in 3 weeks or earlier if needed.  Strict return precautions provided.  Patient visualized by attending Dr. Deirdre Priest as well.  Type 2 diabetes, controlled, with neuropathy (HCC) Repeat A1C today      Alfredo Martinez, MD [facility]

## 2023-05-12 ENCOUNTER — Encounter (HOSPITAL_BASED_OUTPATIENT_CLINIC_OR_DEPARTMENT_OTHER): Payer: Medicare Other | Admitting: General Surgery

## 2023-05-12 DIAGNOSIS — R600 Localized edema: Secondary | ICD-10-CM | POA: Diagnosis not present

## 2023-05-12 DIAGNOSIS — I872 Venous insufficiency (chronic) (peripheral): Secondary | ICD-10-CM | POA: Diagnosis not present

## 2023-05-12 DIAGNOSIS — E11622 Type 2 diabetes mellitus with other skin ulcer: Secondary | ICD-10-CM | POA: Diagnosis not present

## 2023-05-12 DIAGNOSIS — E1140 Type 2 diabetes mellitus with diabetic neuropathy, unspecified: Secondary | ICD-10-CM | POA: Diagnosis not present

## 2023-05-12 DIAGNOSIS — M1990 Unspecified osteoarthritis, unspecified site: Secondary | ICD-10-CM | POA: Diagnosis not present

## 2023-05-12 DIAGNOSIS — L97812 Non-pressure chronic ulcer of other part of right lower leg with fat layer exposed: Secondary | ICD-10-CM | POA: Diagnosis not present

## 2023-05-12 DIAGNOSIS — Z87891 Personal history of nicotine dependence: Secondary | ICD-10-CM | POA: Diagnosis not present

## 2023-05-12 IMAGING — CT CT HEAD W/O CM
1 series · 16 of 30 positions shown, 20 images · non-contrast
Comparison: 09/17/2011

CLINICAL DATA: Ataxia

EXAM:
CT HEAD WITHOUT CONTRAST
TECHNIQUE: Contiguous axial images were obtained from the base of the skull
through the vertex without intravenous contrast.

[Series 2: head w/(date) · axial · 0.47mm/px · z∈[-166,-12]mm · 16 of 35 slices shown, 20 images]
[im 2/35  brain]
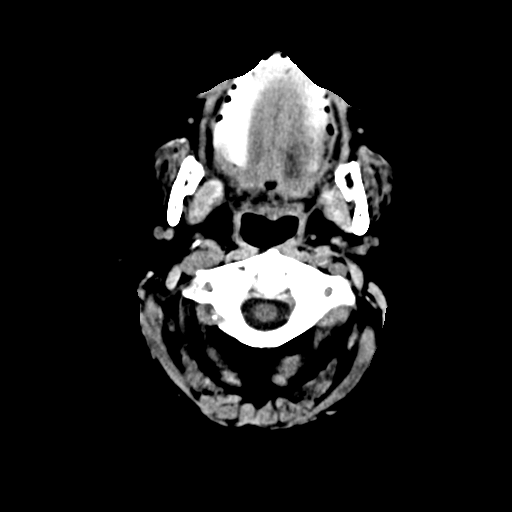
[im 2/35  bone]
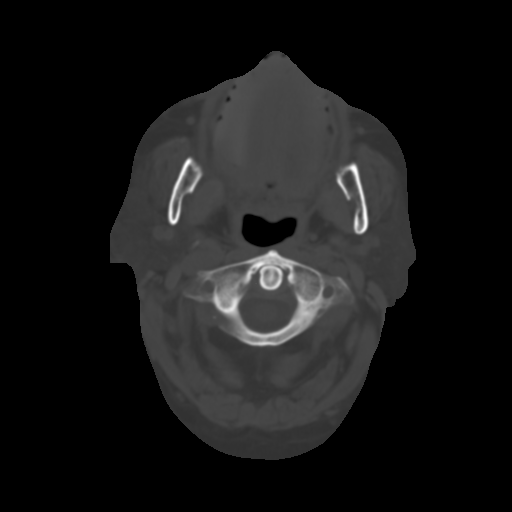
[im 4/35  brain]
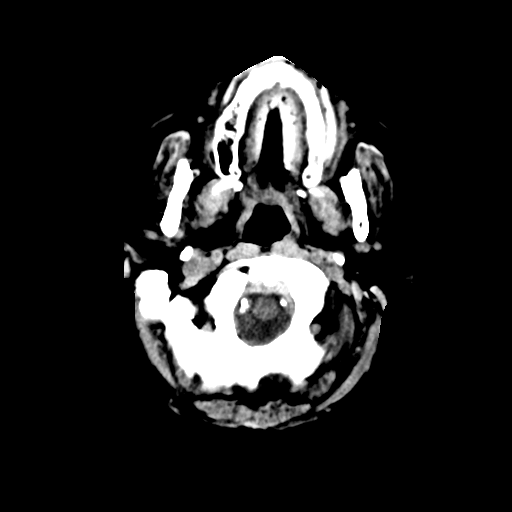
[im 6/35  brain]
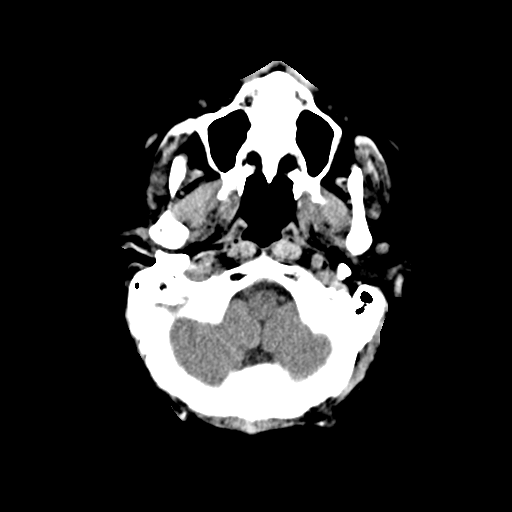
[im 9/35  brain]
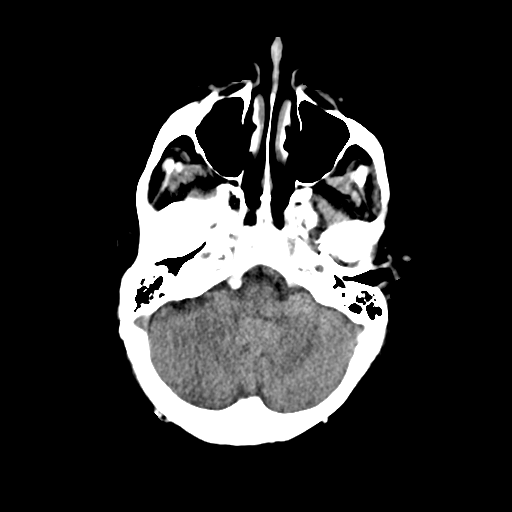
[im 10/35  brain]
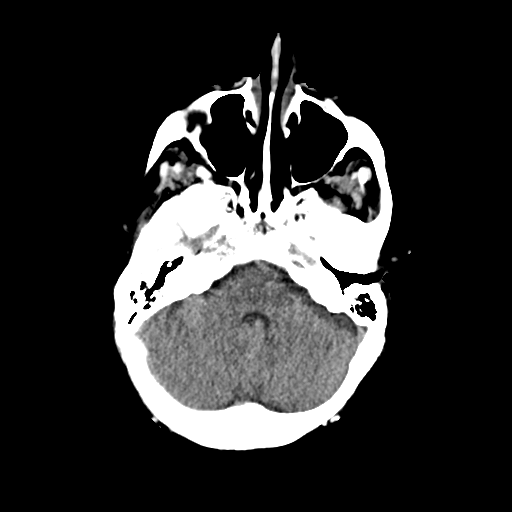
[im 10/35  bone]
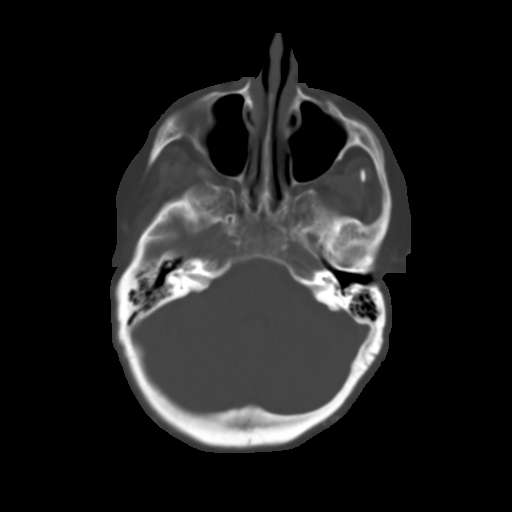
[im 12/35  brain]
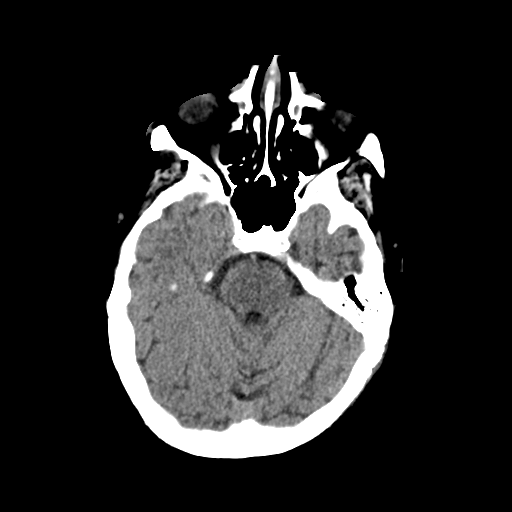
[im 15/35  brain]
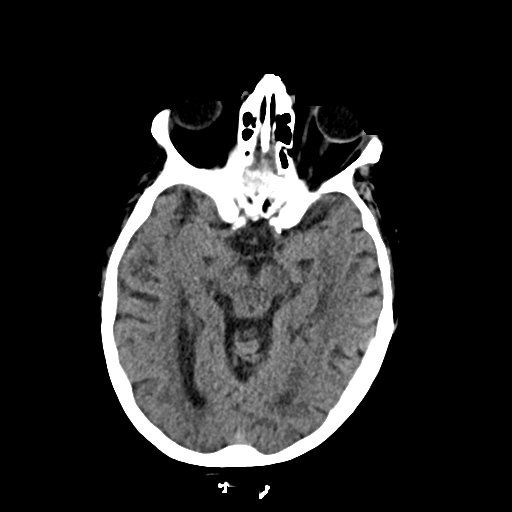
[im 17/35  brain]
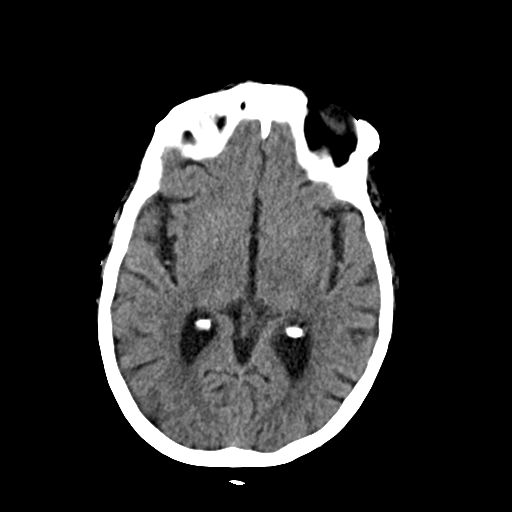
[im 18/35  brain]
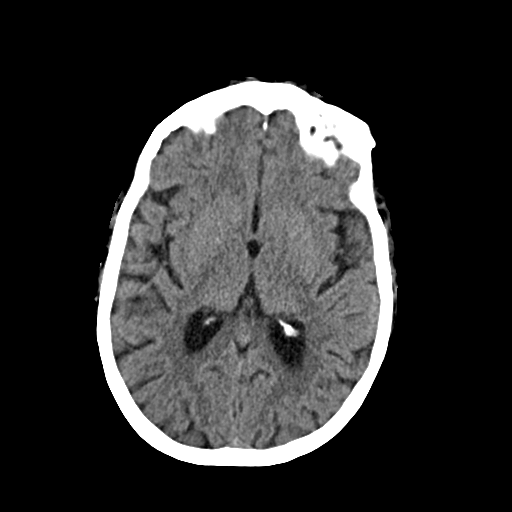
[im 18/35  bone]
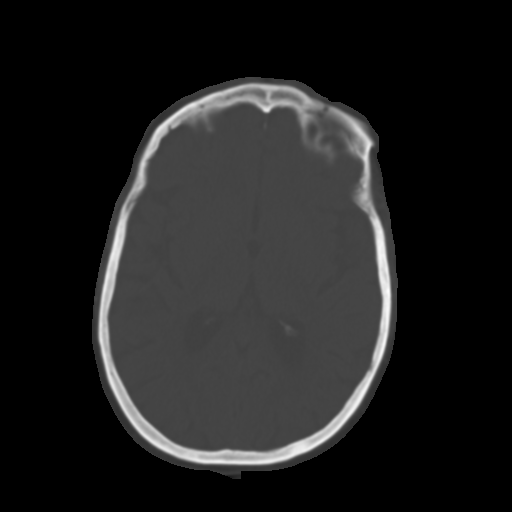
[im 20/35  brain]
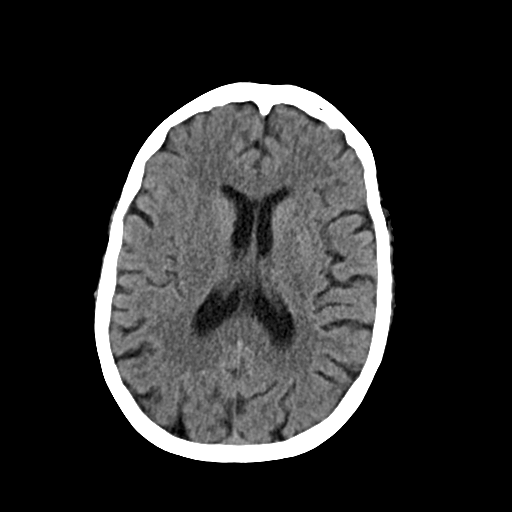
[im 23/35  brain]
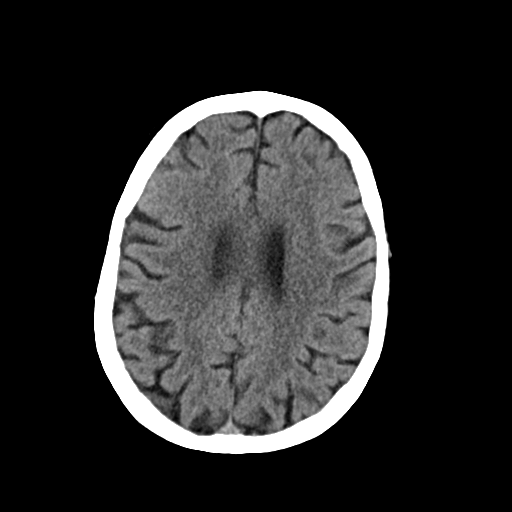
[im 25/35  brain]
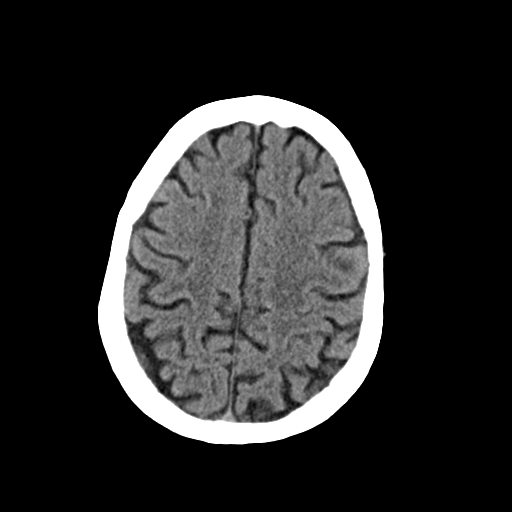
[im 26/35  brain]
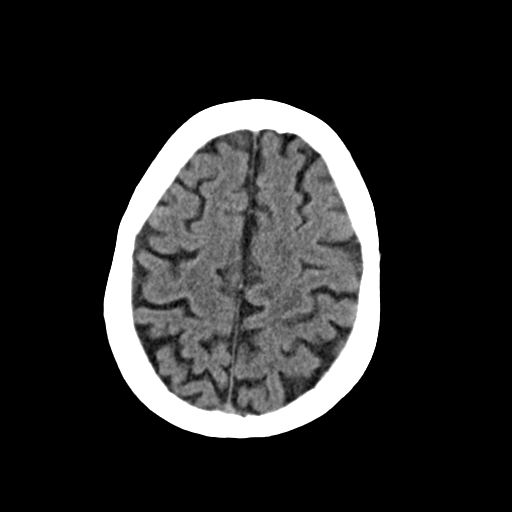
[im 26/35  bone]
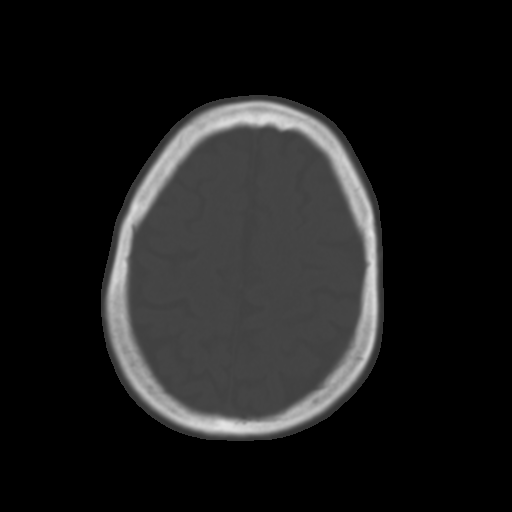
[im 29/35  brain]
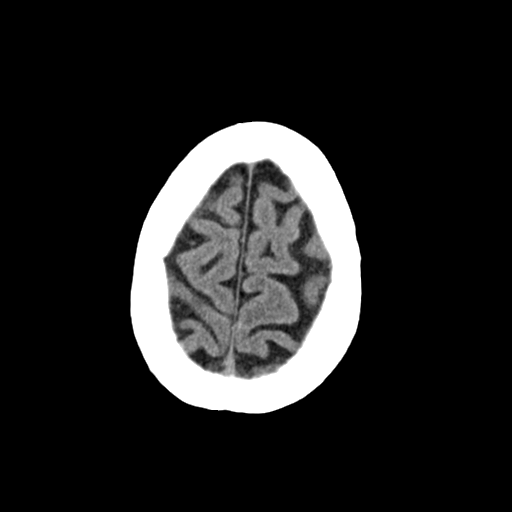
[im 31/35  brain]
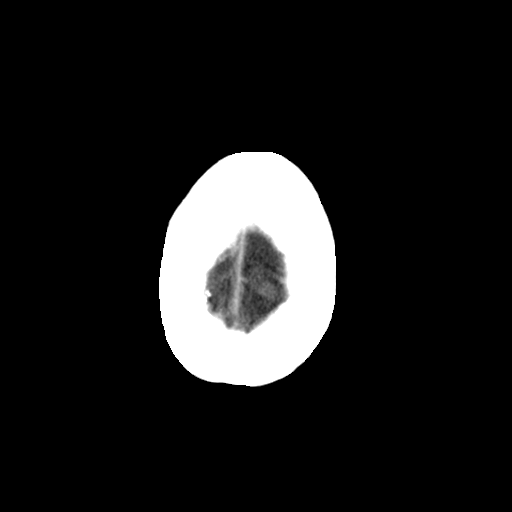
[im 33/35  brain]
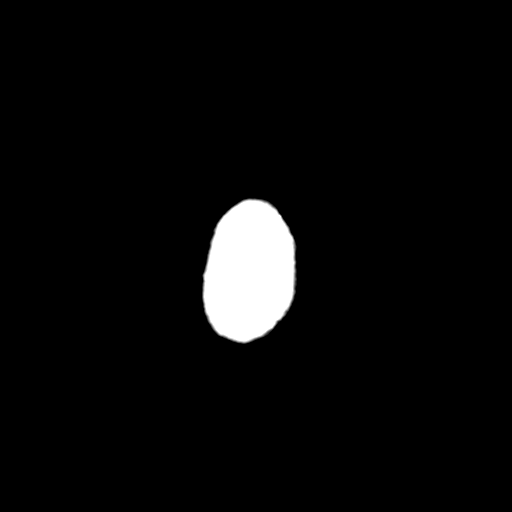

[16 of 30 positions shown; findings below may reference images not displayed]

FINDINGS: Brain: No evidence of acute infarction, hemorrhage, cerebral edema,
mass, mass effect, or midline shift. No hydrocephalus or extra-axial
fluid collection.

Vascular: No hyperdense vessel. Atherosclerotic calcifications in
the intracranial carotid and vertebral arteries.

Skull: Normal. Negative for fracture or focal lesion.

Sinuses/Orbits: No acute finding. Status post bilateral lens
replacements.

Other: Fluid in left mastoid tip.
IMPRESSION: IMPRESSION
No acute intracranial process. No etiology seen for the patient's
ataxia.

## 2023-05-12 NOTE — Progress Notes (Signed)
LOKELANI, FASICK ([id number]) [id number] Page 1 of 10 Visit Report for 05/12/2023 Arrival Information Details Patient Name: Date of Service: WILMOTH, CLOHESSY 05/12/2023 10:00 A M Medical Record Number: [id number] Patient Account Number: [id number] Date of Birth/Sex: Treating RN: 02-Jul-1955 (67 y.o. F) Primary Care Maylen Waltermire: Alfredo Martinez Other Clinician: Referring Feras Gardella: Treating Tatem Holsonback/Extender: Dan Europe, [name] in Treatment: 1 Visit Information History Since Last Visit Added or deleted any medications: No Patient Arrived: Ambulatory Any new allergies or adverse reactions: No Arrival Time: 10:17 Had a fall or experienced change in No Accompanied By: self activities of daily living that may affect Transfer Assistance: None risk of falls: Patient Identification Verified: Yes Signs or symptoms of abuse/neglect since last visito No Secondary Verification Process Completed: Yes Hospitalized since last visit: No Patient Has Alerts: Yes Implantable device outside of the clinic excluding No Patient Alerts: Patient on Blood Thinner cellular tissue based products placed in the center since last visit: Pain Present Now: Yes Electronic Signature(s) Signed: 05/12/2023 2:02:59 PM By: Dayton Scrape Entered By: Dayton Scrape on 05/12/2023 07:18:13 -------------------------------------------------------------------------------- Compression Therapy Details Patient Name: Date of Service: ANAYANCY, POLAND 05/12/2023 10:00 A M Medical Record Number: [id number] Patient Account Number: [id number] Date of Birth/Sex: Treating RN: 20-Jul-1955 (67 y.o. Fredderick Phenix Primary Care Caston Coopersmith: Alfredo Martinez Other Clinician: Referring Owais Pruett: Treating Alilah Mcmeans/Extender: Dan Europe, [name] in Treatment: 1 Compression Therapy Performed for Wound Assessment: Wound #8 Right,Proximal,Anterior Lower Leg Performed By: Clinician  Samuella Bruin, RN Compression Type: Double Layer Post Procedure Diagnosis Same as Pre-procedure Electronic Signature(s) Signed: 05/12/2023 4:12:41 PM By: Gelene Mink By: Samuella Bruin on 05/12/2023 07:34:36 Encounter Discharge Information Details -------------------------------------------------------------------------------- [name] ([id number]) [id number] Page 2 of 10 Patient Name: Date of Service: COYLA, LUTCHMAN 05/12/2023 10:00 A M Medical Record Number: [id number] Patient Account Number: [id number] Date of Birth/Sex: Treating RN: 10/29/1955 (67 y.o. Fredderick Phenix Primary Care Zaydenn Balaguer: Alfredo Martinez Other Clinician: Referring Dream Harman: Treating Samyiah Halvorsen/Extender: Dan Europe, [name] in Treatment: 1 Encounter Discharge Information Items Post Procedure Vitals Discharge Condition: Stable Temperature (F): 97.8 Ambulatory Status: Ambulatory Pulse (bpm): 69 Discharge Destination: Home Respiratory Rate (breaths/min): 18 Transportation: Private Auto Blood Pressure (mmHg): 146/75 Accompanied By: self Schedule Follow-up Appointment: Yes Clinical Summary of Care: Patient Declined Electronic Signature(s) Signed: 05/12/2023 4:12:41 PM By: Samuella Bruin Entered By: Samuella Bruin on 05/12/2023 07:45:51 -------------------------------------------------------------------------------- Lower Extremity Assessment Details Patient Name: Date of Service: MARKEESHA, URBAEZ 05/12/2023 10:00 A M Medical Record Number: [id number] Patient Account Number: [id number] Date of Birth/Sex: Treating RN: [birth year] (67 y.o. Fredderick Phenix Primary Care Domnick Chervenak: Alfredo Martinez Other Clinician: Referring Sabastien Tyler: Treating Taleia Sadowski/Extender: Dan Europe, [name] in Treatment: 1 Edema Assessment Assessed: [Left: No] [Right: No] Edema: [Left: Ye] [Right: s] Calf Left: Right: Point of  Measurement: 36 cm From Medial Instep 37.5 cm Ankle Left: Right: Point of Measurement: 9 cm From Medial Instep 23 cm Vascular Assessment Pulses: Dorsalis Pedis Palpable: [Right:Yes] Extremity colors, hair growth, and conditions: Extremity Color: [Right:Red] Hair Growth on Extremity: [Right:No] Temperature of Extremity: [Right:Warm] Capillary Refill: [Right:< 3 seconds] Dependent Rubor: [Right:No No] Electronic Signature(s) Signed: 05/12/2023 4:12:41 PM By: Samuella Bruin Entered By: Samuella Bruin on 05/12/2023 07:20:38 Habib, Leotis Pain ([id number]) [id number] Page 3 of 10 -------------------------------------------------------------------------------- Multi Wound Chart Details Patient Name: Date of Service: GRABIELA, MITTELSTEADT 05/12/2023 10:00 A M Medical Record Number: [id number] Patient Account Number: [id number] Date of Birth/Sex: Treating RN: 1956-04-14 (67 y.o. F) Primary Care Taunya Goral: Jena Gauss,  Allee Other Clinician: Referring Terri Rorrer: Treating Vikram Tillett/Extender: Dan Europe, [name] in Treatment: 1 Vital Signs Height(in): 70 Pulse(bpm): 69 Weight(lbs): 166 Blood Pressure(mmHg): 146/75 Body Mass Index(BMI): 23.8 Temperature(F): 97.8 Respiratory Rate(breaths/min): 18 [8:Photos:] [N/A:N/A] Right, Proximal, Anterior Lower Leg Right, Distal, Anterior Lower Leg N/A Wound Location: Gradually Appeared Trauma N/A Wounding Event: Venous Leg Ulcer Venous Leg Ulcer N/A Primary Etiology: N/A Diabetic Wound/Ulcer of the Lower N/A Secondary Etiology: Extremity Cataracts, Anemia, Sleep Apnea, Cataracts, Anemia, Sleep Apnea, N/A Comorbid History: Arrhythmia, Coronary Artery Disease, Arrhythmia, Coronary Artery Disease, Hypertension, Peripheral Venous Hypertension, Peripheral Venous Disease, Type II Diabetes, Disease, Type II Diabetes, Osteoarthritis, Neuropathy Osteoarthritis, Neuropathy 03/27/2023 04/10/2023 N/A Date  Acquired: 1 1 N/A Weeks of Treatment: Open Open N/A Wound Status: No No N/A Wound Recurrence: 0.6x0.7x0.1 2x1.5x0.1 N/A Measurements L x W x D (cm) 0.33 2.356 N/A A (cm) : rea 0.033 0.236 N/A Volume (cm) : 58.00% 27.60% N/A % Reduction in A rea: 58.20% 27.40% N/A % Reduction in Volume: Full Thickness Without Exposed Full Thickness Without Exposed N/A Classification: Support Structures Support Structures Medium Medium N/A Exudate A mount: Serosanguineous Serosanguineous N/A Exudate Type: red, brown red, brown N/A Exudate Color: Distinct, outline attached Distinct, outline attached N/A Wound Margin: Large (67-100%) Large (67-100%) N/A Granulation A mount: Red, Pink Red, Pink N/A Granulation Quality: Small (1-33%) Small (1-33%) N/A Necrotic A mount: Eschar, Adherent Slough Eschar, Adherent Slough N/A Necrotic Tissue: Fat Layer (Subcutaneous Tissue): Yes Fat Layer (Subcutaneous Tissue): Yes N/A Exposed Structures: Fascia: No Fascia: No Tendon: No Tendon: No Muscle: No Muscle: No Joint: No Joint: No Bone: No Bone: No Small (1-33%) Small (1-33%) N/A Epithelialization: Debridement - Excisional Debridement - Excisional N/A Debridement: Pre-procedure Verification/Time Out 10:33 10:33 N/A Taken: Lidocaine 4% Topical Solution Lidocaine 4% Topical Solution N/A Pain Control: Necrotic/Eschar, Subcutaneous, Necrotic/Eschar, Subcutaneous, N/A Tissue Debrided: [employer] Skin/Subcutaneous Tissue Skin/Subcutaneous Tissue N/A Level: 0.33 2.36 N/A Debridement A (sq cm): rea Curette Curette N/A Instrument: Minimum Minimum N/A Bleeding: Pressure Pressure N/A Hemostasis Achieved: Procedure was tolerated well Procedure was tolerated well N/A Debridement Treatment ResponseREGENE, EAPEN L ([id number]) [id number] Page 4 of 10 Post Debridement Measurements L x 0.6x0.7x0.1 2x1.5x0.1 N/A W x D (cm) 0.033 0.236 N/A Post Debridement Volume:  (cm) Excoriation: No Excoriation: No N/A Periwound Skin Texture: Induration: No Induration: No Callus: No Callus: No Crepitus: No Crepitus: No Rash: No Rash: No Scarring: No Scarring: No Maceration: No Maceration: Yes N/A Periwound Skin Moisture: Dry/Scaly: No Dry/Scaly: No Atrophie Blanche: No Erythema: Yes N/A Periwound Skin Color: Cyanosis: No Atrophie Blanche: No Ecchymosis: No Cyanosis: No Erythema: No Ecchymosis: No Hemosiderin Staining: No Hemosiderin Staining: No Mottled: No Mottled: No Pallor: No Pallor: No Rubor: No Rubor: No No Abnormality No Abnormality N/A Temperature: Compression Therapy Debridement N/A Procedures Performed: Debridement Treatment Notes Wound #8 (Lower Leg) Wound Laterality: Right, Anterior, Proximal Cleanser Soap and Water Discharge Instruction: May shower and wash wound with dial antibacterial soap and water prior to dressing change. Wound Cleanser Discharge Instruction: Cleanse the wound with wound cleanser prior to applying a clean dressing using gauze sponges, not tissue or cotton balls. Peri-Wound Care Sween Lotion (Moisturizing lotion) Discharge Instruction: Apply moisturizing lotion as directed Topical Primary Dressing Maxorb Extra Ag+ Alginate Dressing, 2x2 (in/in) Discharge Instruction: Apply to wound bed as instructed Secondary Dressing ABD Pad, 8x10 Discharge Instruction: Apply over primary dressing as directed. Secured With Compression Wrap Urgo K2 Lite, (equivalent to a 3 layer) two layer compression system, regular Discharge Instruction: Apply  Urgo K2 Lite as directed (alternative to 3 layer compression). Compression Stockings Add-Ons Wound #9 (Lower Leg) Wound Laterality: Right, Anterior, Distal Cleanser Soap and Water Discharge Instruction: May shower and wash wound with dial antibacterial soap and water prior to dressing change. Wound Cleanser Discharge Instruction: Cleanse the wound with wound  cleanser prior to applying a clean dressing using gauze sponges, not tissue or cotton balls. Peri-Wound Care Sween Lotion (Moisturizing lotion) Discharge Instruction: Apply moisturizing lotion as directed Topical Primary Dressing Maxorb Extra Ag+ Alginate Dressing, 2x2 (in/in) Discharge Instruction: Apply to wound bed as instructed Secondary Dressing ABD Pad, 8x10 Discharge Instruction: Apply over primary dressing as directed. YUMIKO, REMINES ([id number]) [id number] Page 5 of 10 Secured With Compression Wrap Urgo K2 Lite, (equivalent to a 3 layer) two layer compression system, regular Discharge Instruction: Apply Urgo K2 Lite as directed (alternative to 3 layer compression). Compression Stockings Add-Ons Electronic Signature(s) Signed: 05/12/2023 10:47:46 AM By: Duanne Guess MD FACS Entered By: Duanne Guess on 05/12/2023 07:47:46 -------------------------------------------------------------------------------- Multi-Disciplinary Care Plan Details Patient Name: Date of Service: [name]. 05/12/2023 10:00 A M Medical Record Number: [id number] Patient Account Number: [id number] Date of Birth/Sex: Treating RN: 1956-03-29 (67 y.o. Fredderick Phenix Primary Care Joyce Heitman: Alfredo Martinez Other Clinician: Referring Tyrone Pautsch: Treating Emalee Knies/Extender: Dan Europe, [name] in Treatment: 1 Active Inactive Orientation to the Wound Care Program Nursing Diagnoses: Knowledge deficit related to the wound healing center program Goals: Patient/caregiver will verbalize understanding of the Wound Healing Center Program Date Initiated: 05/12/2023 Target Resolution Date: 05/30/2023 Goal Status: Active Interventions: Provide education on orientation to the wound center Notes: Wound/Skin Impairment Nursing Diagnoses: Impaired tissue integrity Knowledge deficit related to ulceration/compromised skin integrity Goals: Patient/caregiver  will verbalize understanding of skin care regimen Date Initiated: 05/12/2023 Target Resolution Date: 06/27/2023 Goal Status: Active Ulcer/skin breakdown will heal within 14 weeks Date Initiated: 05/12/2023 Target Resolution Date: 06/06/2023 Goal Status: Active Interventions: Assess ulceration(s) every visit Treatment Activities: Skin care regimen initiated : 05/12/2023 Topical wound management initiated : 05/12/2023 Notes: Electronic Signature(s) AVERYANNA, DENYER ([id number]) [id number] Page 6 of 10 Signed: 05/12/2023 4:12:41 PM By: Samuella Bruin Entered By: Samuella Bruin on 05/12/2023 07:28:45 -------------------------------------------------------------------------------- Pain Assessment Details Patient Name: Date of Service: CAROLIE, COCKAYNE 05/12/2023 10:00 A M Medical Record Number: [id number] Patient Account Number: [id number] Date of Birth/Sex: Treating RN: [birth year] (67 y.o. F) Primary Care Abbigael Detlefsen: Alfredo Martinez Other Clinician: Referring Merry Pond: Treating Jaymie Mckiddy/Extender: Dan Europe, [name] in Treatment: 1 Active Problems Location of Pain Severity and Description of Pain Patient Has Paino Yes Site Locations Rate the pain. Current Pain Level: 5 Worst Pain Level: 10 Least Pain Level: 0 Tolerable Pain Level: 2 Character of Pain Describe the Pain: Aching, Throbbing Pain Management and Medication Current Pain Management: Electronic Signature(s) Signed: 05/12/2023 2:02:59 PM By: Dayton Scrape Entered By: Dayton Scrape on 05/12/2023 07:18:57 -------------------------------------------------------------------------------- Patient/Caregiver Education Details Patient Name: Date of Service: Bones, Rochele L. 11/18/2024andnbsp10:00 A M Medical Record Number: [id number] Patient Account Number: [id number] Date of Birth/Gender: Treating RN: 31-May-1956 (67 y.o. Fredderick Phenix Primary Care Physician: Alfredo Martinez  Other Clinician: Referring Physician: Treating Physician/Extender: Candelaria Celeste in Treatment: 1 Education Assessment Waldo, Fairgarden L ([id number]) [id number] Page 7 of 10 Education Provided To: Patient Education Topics Provided Wound/Skin Impairment: Methods: Explain/Verbal Responses: Reinforcements needed, State content correctly Electronic Signature(s) Signed: 05/12/2023 4:12:41 PM By: Samuella Bruin Entered By: Samuella Bruin on 05/12/2023 07:28:58 -------------------------------------------------------------------------------- Wound Assessment Details Patient Name: Date of Service: Elson Clan  L. 05/12/2023 10:00 A M Medical Record Number: [id number] Patient Account Number: [id number] Date of Birth/Sex: Treating RN: June 05, 1956 (67 y.o. Fredderick Phenix Primary Care Bertram Haddix: Alfredo Martinez Other Clinician: Referring Nguyet Mercer: Treating Talina Pleitez/Extender: Dan Europe, [name] in Treatment: 1 Wound Status Wound Number: 8 Primary Venous Leg Ulcer Etiology: Wound Location: Right, Proximal, Anterior Lower Leg Wound Open Wounding Event: Gradually Appeared Status: Date Acquired: 03/27/2023 Comorbid Cataracts, Anemia, Sleep Apnea, Arrhythmia, Coronary Artery Weeks Of Treatment: 1 History: Disease, Hypertension, Peripheral Venous Disease, Type II Clustered Wound: No Diabetes, Osteoarthritis, Neuropathy Photos Wound Measurements Length: (cm) 0.6 Width: (cm) 0.7 Depth: (cm) 0.1 Area: (cm) 0.33 Volume: (cm) 0.033 % Reduction in Area: 58% % Reduction in Volume: 58.2% Epithelialization: Small (1-33%) Tunneling: No Undermining: No Wound Description Classification: Full Thickness Without Exposed Suppor Wound Margin: Distinct, outline attached Exudate Amount: Medium Exudate Type: Serosanguineous Exudate Color: red, brown t Structures Foul Odor After Cleansing: No Slough/Fibrino Yes Wound  Bed Granulation Amount: Large (67-100%) Exposed Structure Granulation Quality: Red, Pink Fascia Exposed: No Necrotic Amount: Small (1-33%) Fat Layer (Subcutaneous Tissue) Exposed: Yes Necrotic Quality: Eschar, Adherent Slough Tendon Exposed: No Muscle Exposed: No Anastasia, Kemba L ([id number]) [id number] Page 8 of 10 Joint Exposed: No Bone Exposed: No Periwound Skin Texture Texture Color No Abnormalities Noted: Yes No Abnormalities Noted: Yes Moisture Temperature / Pain No Abnormalities Noted: Yes Temperature: No Abnormality Treatment Notes Wound #8 (Lower Leg) Wound Laterality: Right, Anterior, Proximal Cleanser Soap and Water Discharge Instruction: May shower and wash wound with dial antibacterial soap and water prior to dressing change. Wound Cleanser Discharge Instruction: Cleanse the wound with wound cleanser prior to applying a clean dressing using gauze sponges, not tissue or cotton balls. Peri-Wound Care Sween Lotion (Moisturizing lotion) Discharge Instruction: Apply moisturizing lotion as directed Topical Primary Dressing Maxorb Extra Ag+ Alginate Dressing, 2x2 (in/in) Discharge Instruction: Apply to wound bed as instructed Secondary Dressing ABD Pad, 8x10 Discharge Instruction: Apply over primary dressing as directed. Secured With Compression Wrap Urgo K2 Lite, (equivalent to a 3 layer) two layer compression system, regular Discharge Instruction: Apply Urgo K2 Lite as directed (alternative to 3 layer compression). Compression Stockings Add-Ons Electronic Signature(s) Signed: 05/12/2023 4:12:41 PM By: Samuella Bruin Entered By: Samuella Bruin on 05/12/2023 07:26:05 -------------------------------------------------------------------------------- Wound Assessment Details Patient Name: Date of Service: CYRENE, STRICK 05/12/2023 10:00 A M Medical Record Number: [id number] Patient Account Number: [id number] Date of Birth/Sex:  Treating RN: Aug 23, 1955 (67 y.o. Fredderick Phenix Primary Care Khloei Spiker: Alfredo Martinez Other Clinician: Referring Janiya Millirons: Treating Hamad Whyte/Extender: Dan Europe, [name] in Treatment: 1 Wound Status Wound Number: 9 Primary Venous Leg Ulcer Etiology: Wound Location: Right, Distal, Anterior Lower Leg Secondary Diabetic Wound/Ulcer of the Lower Extremity Wounding Event: Trauma Etiology: Date Acquired: 04/10/2023 Wound Open Weeks Of Treatment: 1 Status: Clustered Wound: No Comorbid Cataracts, Anemia, Sleep Apnea, Arrhythmia, Coronary Artery History: Disease, Hypertension, Peripheral Venous Disease, Type II Diabetes, Osteoarthritis, Neuropathy Gallaway, Amiah L ([id number]) [id number] Page 9 of 10 Photos Wound Measurements Length: (cm) 2 Width: (cm) 1.5 Depth: (cm) 0.1 Area: (cm) 2.356 Volume: (cm) 0.236 % Reduction in Area: 27.6% % Reduction in Volume: 27.4% Epithelialization: Small (1-33%) Tunneling: No Undermining: No Wound Description Classification: Full Thickness Without Exposed Support Structures Wound Margin: Distinct, outline attached Exudate Amount: Medium Exudate Type: Serosanguineous Exudate Color: red, brown Foul Odor After Cleansing: No Slough/Fibrino Yes Wound Bed Granulation Amount: Large (67-100%) Exposed Structure Granulation Quality: Red, Pink Fascia Exposed: No Necrotic Amount: Small (1-33%) Fat Layer (Subcutaneous  Tissue) Exposed: Yes Necrotic Quality: Eschar, Adherent Slough Tendon Exposed: No Muscle Exposed: No Joint Exposed: No Bone Exposed: No Periwound Skin Texture Texture Color No Abnormalities Noted: Yes No Abnormalities Noted: Yes Moisture Temperature / Pain No Abnormalities Noted: No Temperature: No Abnormality Dry / Scaly: No Maceration: Yes Treatment Notes Wound #9 (Lower Leg) Wound Laterality: Right, Anterior, Distal Cleanser Soap and Water Discharge Instruction: May shower  and wash wound with dial antibacterial soap and water prior to dressing change. Wound Cleanser Discharge Instruction: Cleanse the wound with wound cleanser prior to applying a clean dressing using gauze sponges, not tissue or cotton balls. Peri-Wound Care Sween Lotion (Moisturizing lotion) Discharge Instruction: Apply moisturizing lotion as directed Topical Primary Dressing Maxorb Extra Ag+ Alginate Dressing, 2x2 (in/in) Discharge Instruction: Apply to wound bed as instructed Secondary Dressing ABD Pad, 8x10 Discharge Instruction: Apply over primary dressing as directed. Secured With Compression Wrap Urgo K2 Lite, (equivalent to a 3 layer) two layer compression system, regular Mcleish, Shallyn L ([id number]) [id number] Page 10 of 10 Discharge Instruction: Apply Urgo K2 Lite as directed (alternative to 3 layer compression). Compression Stockings Add-Ons Electronic Signature(s) Signed: 05/12/2023 4:12:41 PM By: Samuella Bruin Entered By: Samuella Bruin on 05/12/2023 07:26:26 -------------------------------------------------------------------------------- Vitals Details Patient Name: Date of Service: [name]. 05/12/2023 10:00 A M Medical Record Number: [id number] Patient Account Number: [id number] Date of Birth/Sex: Treating RN: [birth year] (67 y.o. F) Primary Care Adorian Gwynne: Alfredo Martinez Other Clinician: Referring Rima Blizzard: Treating Celestino Ackerman/Extender: Dan Europe, [name] in Treatment: 1 Vital Signs Time Taken: 10:18 Temperature (F): 97.8 Height (in): 70 Pulse (bpm): 69 Weight (lbs): 166 Respiratory Rate (breaths/min): 18 Body Mass Index (BMI): 23.8 Blood Pressure (mmHg): 146/75 Reference Range: 80 - 120 mg / dl Electronic Signature(s) Signed: 05/12/2023 2:02:59 PM By: Dayton Scrape Entered By: Dayton Scrape on 05/12/2023 07:18:36

## 2023-05-12 NOTE — Progress Notes (Signed)
SHAMECCA, SPOERL ([id number]) [id number] Page 1 of 10 Visit Report for 05/12/2023 Chief Complaint Document Details Patient Name: Date of Service: [name], [name] 05/12/2023 10:00 A M Medical Record Number: [id number] Patient Account Number: [id number] Date of Birth/Sex: Treating RN: 04-25-1956 (67 y.o. F) Primary Care Provider: Alfredo Martinez Other Clinician: Referring Provider: Treating Provider/Extender: Dan Europe, [name] in Treatment: 1 Information Obtained from: Patient Chief Complaint Patient presents for treatment of an open ulcer due to venous insufficiency Electronic Signature(s) Signed: 05/12/2023 10:47:54 AM By: Duanne Guess MD FACS Entered By: Duanne Guess on 05/12/2023 07:47:54 -------------------------------------------------------------------------------- Debridement Details Patient Name: Date of Service: [name]. 05/12/2023 10:00 A M Medical Record Number: [id number] Patient Account Number: [id number] Date of Birth/Sex: Treating RN: 1956-02-23 (67 y.o. Fredderick Phenix Primary Care Provider: Alfredo Martinez Other Clinician: Referring Provider: Treating Provider/Extender: Dan Europe, [name] in Treatment: 1 Debridement Performed for Assessment: Wound #8 Right,Proximal,Anterior Lower Leg Performed By: Physician Duanne Guess, MD The following information was scribed by: Samuella Bruin The information was scribed for: Duanne Guess Debridement Type: Debridement Severity of Tissue Pre Debridement: Fat layer exposed Level of Consciousness (Pre-procedure): Awake and Alert Pre-procedure Verification/Time Out Yes - 10:33 Taken: Start Time: 10:33 Pain Control: Lidocaine 4% Topical Solution Percent of Wound Bed Debrided: 100% T Area Debrided (cm): otal 0.33 Tissue and other material debrided: Non-Viable, Eschar, Slough, Subcutaneous, Slough Level: Skin/Subcutaneous  Tissue Debridement Description: Excisional Instrument: Curette Bleeding: Minimum Hemostasis Achieved: Pressure Response to Treatment: Procedure was tolerated well Level of Consciousness (Post- Awake and Alert procedure): Post Debridement Measurements of Total Wound Length: (cm) 0.6 Width: (cm) 0.7 Depth: (cm) 0.1 Volume: (cm) 0.033 Character of Wound/Ulcer Post Debridement: Improved Severity of Tissue Post Debridement: Fat layer exposed Brackens, Toshika L ([id number]) [id number] Page 2 of 10 Post Procedure Diagnosis Same as Pre-procedure Electronic Signature(s) Signed: 05/12/2023 11:05:23 AM By: Duanne Guess MD FACS Signed: 05/12/2023 4:12:41 PM By: Samuella Bruin Entered By: Samuella Bruin on 05/12/2023 07:34:05 -------------------------------------------------------------------------------- Debridement Details Patient Name: Date of Service: [name]. 05/12/2023 10:00 A M Medical Record Number: [id number] Patient Account Number: [id number] Date of Birth/Sex: Treating RN: [birth year] (67 y.o. Fredderick Phenix Primary Care Provider: Alfredo Martinez Other Clinician: Referring Provider: Treating Provider/Extender: Dan Europe, [name] in Treatment: 1 Debridement Performed for Assessment: Wound #9 Right,Distal,Anterior Lower Leg Performed By: Physician Duanne Guess, MD The following information was scribed by: Samuella Bruin The information was scribed for: Duanne Guess Debridement Type: Debridement Severity of Tissue Pre Debridement: Fat layer exposed Level of Consciousness (Pre-procedure): Awake and Alert Pre-procedure Verification/Time Out Yes - 10:33 Taken: Start Time: 10:33 Pain Control: Lidocaine 4% Topical Solution Percent of Wound Bed Debrided: 100% T Area Debrided (cm): otal 2.36 Tissue and other material debrided: Non-Viable, Eschar, Slough, Subcutaneous, Slough Level: Skin/Subcutaneous  Tissue Debridement Description: Excisional Instrument: Curette Bleeding: Minimum Hemostasis Achieved: Pressure Response to Treatment: Procedure was tolerated well Level of Consciousness (Post- Awake and Alert procedure): Post Debridement Measurements of Total Wound Length: (cm) 2 Width: (cm) 1.5 Depth: (cm) 0.1 Volume: (cm) 0.236 Character of Wound/Ulcer Post Debridement: Improved Severity of Tissue Post Debridement: Fat layer exposed Post Procedure Diagnosis Same as Pre-procedure Electronic Signature(s) Signed: 05/12/2023 11:05:23 AM By: Duanne Guess MD FACS Signed: 05/12/2023 4:12:41 PM By: Samuella Bruin Entered By: Samuella Bruin on 05/12/2023 07:34:26 HPI Details -------------------------------------------------------------------------------- [name] ([id number]) [id number] Page 3 of 10 Patient Name: Date of Service: [name], [name] 05/12/2023 10:00 A M Medical Record Number: [id number]  Patient Account Number: [id number] Date of Birth/Sex: Treating RN: 07-01-1955 (67 y.o. F) Primary Care Provider: Alfredo Martinez Other Clinician: Referring Provider: Treating Provider/Extender: Dan Europe, [name] in Treatment: 1 History of Present Illness HPI Description: ADMISSION 05/02/2023 ***ABIs R: 0.88, palpable pulses*** This is a 67 year old woman with well-controlled type 2 diabetes (last hemoglobin A1c 5.4%) presenting to the wound care center with two ulcers on her right lower leg. She saw her primary care provider earlier this week and per their note, she has had lower leg swelling and erythema for about 2 months. Her PCP prescribed Augmentin and doxycycline. 05/12/2023: Both wounds measured smaller today. There is some slough and eschar accumulation on each. She saw her PCP who removed our wraps for some reason and therefore her edema control is not ideal. Electronic Signature(s) Signed: 05/12/2023 10:48:38 AM  By: Duanne Guess MD FACS Entered By: Duanne Guess on 05/12/2023 07:48:38 -------------------------------------------------------------------------------- Physical Exam Details Patient Name: Date of Service: [name]. 05/12/2023 10:00 A M Medical Record Number: [id number] Patient Account Number: [id number] Date of Birth/Sex: Treating RN: April 16, 1956 (67 y.o. F) Primary Care Provider: Alfredo Martinez Other Clinician: Referring Provider: Treating Provider/Extender: Dan Europe, [name] in Treatment: 1 Constitutional Slightly hypertensive. . . . no acute distress. Respiratory Normal work of breathing on room air.. Notes 05/12/2023: Both wounds measured smaller today. There is some slough and eschar accumulation on each. She saw her PCP who removed our wraps for some reason and therefore her edema control is not ideal. Electronic Signature(s) Signed: 05/12/2023 10:52:10 AM By: Duanne Guess MD FACS Entered By: Duanne Guess on 05/12/2023 07:52:10 -------------------------------------------------------------------------------- Physician Orders Details Patient Name: Date of Service: [name]. 05/12/2023 10:00 A M Medical Record Number: [id number] Patient Account Number: [id number] Date of Birth/Sex: Treating RN: 28-Jan-1956 (67 y.o. Fredderick Phenix Primary Care Provider: Alfredo Martinez Other Clinician: Referring Provider: Treating Provider/Extender: Dan Europe, [name] in Treatment: 1 The following information was scribed by: Samuella Bruin The information was scribed for: Duanne Guess Verbal / Phone Orders: No ZURIA, STHILL ([id number]) [id number] Page 4 of 10 Diagnosis Coding ICD-10 Coding Code Description [id number] Non-pressure chronic ulcer of other part of right lower leg with fat layer exposed E11.622 Type 2 diabetes mellitus with other skin ulcer R60.0 Localized edema Follow-up  Appointments ppointment in 1 week. - Dr. Lady Gary RM 2 Return A Anesthetic (In clinic) Topical Lidocaine 4% applied to wound bed Bathing/ Shower/ Hygiene May shower with protection but do not get wound dressing(s) wet. Protect dressing(s) with water repellant cover (for example, large plastic bag) or a cast cover and may then take shower. Edema Control - Orders / Instructions Elevate legs to the level of the heart or above for 30 minutes daily and/or when sitting for 3-4 times a day throughout the day. Avoid standing for long periods of time. Exercise regularly Wound Treatment Wound #8 - Lower Leg Wound Laterality: Right, Anterior, Proximal Cleanser: Soap and Water 1 x Per Week/30 Days Discharge Instructions: May shower and wash wound with dial antibacterial soap and water prior to dressing change. Cleanser: Wound Cleanser 1 x Per Week/30 Days Discharge Instructions: Cleanse the wound with wound cleanser prior to applying a clean dressing using gauze sponges, not tissue or cotton balls. Peri-Wound Care: Sween Lotion (Moisturizing lotion) 1 x Per Week/30 Days Discharge Instructions: Apply moisturizing lotion as directed Prim Dressing: Maxorb Extra Ag+ Alginate Dressing, 2x2 (in/in) 1 x Per Week/30 Days ary Discharge Instructions: Apply to wound bed  as instructed Secondary Dressing: ABD Pad, 8x10 1 x Per Week/30 Days Discharge Instructions: Apply over primary dressing as directed. Compression Wrap: Urgo K2 Lite, (equivalent to a 3 layer) two layer compression system, regular 1 x Per Week/30 Days Discharge Instructions: Apply Urgo K2 Lite as directed (alternative to 3 layer compression). Wound #9 - Lower Leg Wound Laterality: Right, Anterior, Distal Cleanser: Soap and Water 1 x Per Week/30 Days Discharge Instructions: May shower and wash wound with dial antibacterial soap and water prior to dressing change. Cleanser: Wound Cleanser 1 x Per Week/30 Days Discharge Instructions: Cleanse the  wound with wound cleanser prior to applying a clean dressing using gauze sponges, not tissue or cotton balls. Peri-Wound Care: Sween Lotion (Moisturizing lotion) 1 x Per Week/30 Days Discharge Instructions: Apply moisturizing lotion as directed Prim Dressing: Maxorb Extra Ag+ Alginate Dressing, 2x2 (in/in) 1 x Per Week/30 Days ary Discharge Instructions: Apply to wound bed as instructed Secondary Dressing: ABD Pad, 8x10 1 x Per Week/30 Days Discharge Instructions: Apply over primary dressing as directed. Compression Wrap: Urgo K2 Lite, (equivalent to a 3 layer) two layer compression system, regular 1 x Per Week/30 Days Discharge Instructions: Apply Urgo K2 Lite as directed (alternative to 3 layer compression). Patient Medications llergies: cephalexin, latex, Westcort (neomycin-polymx-HC), adhesive tape A Notifications Medication Indication Start End 05/12/2023 lidocaine DOSE topical 4 % cream - cream topical Electronic Signature(s) Signed: 05/12/2023 11:05:23 AM By: Duanne Guess MD FACS Henry, Leotis Pain ([id number]) [id number] Page 5 of 10 Entered By: Duanne Guess on 05/12/2023 07:52:25 -------------------------------------------------------------------------------- Problem List Details Patient Name: Date of Service: KAYSON, GANGULY 05/12/2023 10:00 A M Medical Record Number: [id number] Patient Account Number: [id number] Date of Birth/Sex: Treating RN: 1956/05/06 (67 y.o. F) Primary Care Provider: Alfredo Martinez Other Clinician: Referring Provider: Treating Provider/Extender: Dan Europe, [name] in Treatment: 1 Active Problems ICD-10 Encounter Code Description Active Date MDM Diagnosis L97.812 Non-pressure chronic ulcer of other part of right lower leg with fat layer 05/02/2023 No Yes exposed E11.622 Type 2 diabetes mellitus with other skin ulcer 05/02/2023 No Yes R60.0 Localized edema 05/02/2023 No Yes Inactive  Problems Resolved Problems Electronic Signature(s) Signed: 05/12/2023 10:47:34 AM By: Duanne Guess MD FACS Entered By: Duanne Guess on 05/12/2023 07:47:34 -------------------------------------------------------------------------------- Progress Note Details Patient Name: Date of Service: [name]. 05/12/2023 10:00 A M Medical Record Number: [id number] Patient Account Number: [id number] Date of Birth/Sex: Treating RN: 02-07-1956 (67 y.o. F) Primary Care Provider: Alfredo Martinez Other Clinician: Referring Provider: Treating Provider/Extender: Dan Europe, [name] in Treatment: 1 Subjective Chief Complaint Information obtained from Patient Patient presents for treatment of an open ulcer due to venous insufficiency History of Present Illness (HPI) ADMISSION 05/02/2023 ***ABIs R: 0.88, palpable pulses*** This is a 67 year old woman with well-controlled type 2 diabetes (last hemoglobin A1c 5.4%) presenting to the wound care center with two ulcers on her right lower leg. She saw her primary care provider earlier this week and per their note, she has had lower leg swelling and erythema for about 2 months. Her PCP SHAYNNA, LABAN ([id number]) [id number] Page 6 of 10 prescribed Augmentin and doxycycline. 05/12/2023: Both wounds measured smaller today. There is some slough and eschar accumulation on each. She saw her PCP who removed our wraps for some reason and therefore her edema control is not ideal. Patient History Information obtained from Patient, Chart. Family History Unknown History. Social History Former smoker - ended on 06/25/1991, Marital Status - Married, Alcohol Use - Never, Drug Use -  No History, Caffeine Use - Daily - coffee. Medical History Eyes Patient has history of Cataracts Hematologic/Lymphatic Patient has history of Anemia - Iron deficiency anemia Respiratory Patient has history of Sleep  Apnea Cardiovascular Patient has history of Arrhythmia - Paroxysmal atrial fibrillation, Coronary Artery Disease, Hypertension, Peripheral Venous Disease - venous stasis and insufficiency Endocrine Patient has history of Type II Diabetes Musculoskeletal Patient has history of Osteoarthritis Neurologic Patient has history of Neuropathy Hospitalization/Surgery History - Decompressive lumbar laminectomy level 2. - Parathyroidectomy (Left). - Pacemaker leadless insertion. - T knee otal arthroplasty (Right). - stab phlebectomy (Right). - T knee arthroplasty (Left). - Laparoscopy. - Gastric bypass. - Electrocardiogram. - Cardiac otal catheterization. - Cataract extraction w/ intraocular lens implant, bilateral. - Colonoscopy. - Flexible sigmoidoscopy. - Leg surgery (Left) Comment: metal and pins in lower left leg. - Multiple tooth extractions. - Shoulder arthroscopy w/ rotator cuff repair (Right). - Tonsillectomy. - Varicose vein surgery. Medical A Surgical History Notes nd Ear/Nose/Mouth/Throat Presbycusis of both ears Cardiovascular Second degree AV block, Pacemaker Gastrointestinal Spigelian hernia, Umbilical hernia, GERD, Rectal prolapse, Rectovaginal fistula, NASH (nonalcoholic steatohepatitis) Endocrine Hyperparathyroidism, GOITER, MULTINODULAR Genitourinary Renal insufficiency Integumentary (Skin) PSORIASIS Psychiatric major depression Objective Constitutional Slightly hypertensive. no acute distress. Vitals Time Taken: 10:18 AM, Height: 70 in, Weight: 166 lbs, BMI: 23.8, Temperature: 97.8 F, Pulse: 69 bpm, Respiratory Rate: 18 breaths/min, Blood Pressure: 146/75 mmHg. Respiratory Normal work of breathing on room air.. General Notes: 05/12/2023: Both wounds measured smaller today. There is some slough and eschar accumulation on each. She saw her PCP who removed our wraps for some reason and therefore her edema control is not ideal. Integumentary (Hair, Skin) Wound #8 status  is Open. Original cause of wound was Gradually Appeared. The date acquired was: 03/27/2023. The wound has been in treatment 1 weeks. The wound is located on the Right,Proximal,Anterior Lower Leg. The wound measures 0.6cm length x 0.7cm width x 0.1cm depth; 0.33cm^2 area and 0.033cm^3 volume. There is Fat Layer (Subcutaneous Tissue) exposed. There is no tunneling or undermining noted. There is a medium amount of serosanguineous drainage noted. The wound margin is distinct with the outline attached to the wound base. There is large (67-100%) red, pink granulation within the wound bed. There is a small (1-33%) amount of necrotic tissue within the wound bed including Eschar and Adherent Slough. The periwound skin appearance had no abnormalities noted for texture. The periwound skin appearance had no abnormalities noted for moisture. The periwound skin appearance had no abnormalities noted for color. Periwound temperature was noted as No Abnormality. Wound #9 status is Open. Original cause of wound was Trauma. The date acquired was: 04/10/2023. The wound has been in treatment 1 weeks. The wound is located on the Right,Distal,Anterior Lower Leg. The wound measures 2cm length x 1.5cm width x 0.1cm depth; 2.356cm^2 area and 0.236cm^3 volume. There is Fat Layer (Subcutaneous Tissue) exposed. There is no tunneling or undermining noted. There is a medium amount of serosanguineous drainage noted. The MKENNA, SAAR ([id number]) [phone].pdf Page 7 of 10 wound margin is distinct with the outline attached to the wound base. There is large (67-100%) red, pink granulation within the wound bed. There is a small (1- 33%) amount of necrotic tissue within the wound bed including Eschar and Adherent Slough. The periwound skin appearance had no abnormalities noted for texture. The periwound skin appearance had no abnormalities noted for color. The periwound skin appearance exhibited: Maceration. The  periwound skin appearance did not exhibit: Dry/Scaly. Periwound temperature was noted  as No Abnormality. Assessment Active Problems ICD-10 Non-pressure chronic ulcer of other part of right lower leg with fat layer exposed Type 2 diabetes mellitus with other skin ulcer Localized edema Procedures Wound #8 Pre-procedure diagnosis of Wound #8 is a Venous Leg Ulcer located on the Right,Proximal,Anterior Lower Leg .Severity of Tissue Pre Debridement is: Fat layer exposed. There was a Excisional Skin/Subcutaneous Tissue Debridement with a total area of 0.33 sq cm performed by Duanne Guess, MD. With the following instrument(s): Curette to remove Non-Viable tissue/material. Material removed includes Eschar, Subcutaneous Tissue, and Slough after achieving pain control using Lidocaine 4% T opical Solution. No specimens were taken. A time out was conducted at 10:33, prior to the start of the procedure. A Minimum amount of bleeding was controlled with Pressure. The procedure was tolerated well. Post Debridement Measurements: 0.6cm length x 0.7cm width x 0.1cm depth; 0.033cm^3 volume. Character of Wound/Ulcer Post Debridement is improved. Severity of Tissue Post Debridement is: Fat layer exposed. Post procedure Diagnosis Wound #8: Same as Pre-Procedure Pre-procedure diagnosis of Wound #8 is a Venous Leg Ulcer located on the Right,Proximal,Anterior Lower Leg . There was a Double Layer Compression Therapy Procedure by Samuella Bruin, RN. Post procedure Diagnosis Wound #8: Same as Pre-Procedure Wound #9 Pre-procedure diagnosis of Wound #9 is a Venous Leg Ulcer located on the Right,Distal,Anterior Lower Leg .Severity of Tissue Pre Debridement is: Fat layer exposed. There was a Excisional Skin/Subcutaneous Tissue Debridement with a total area of 2.36 sq cm performed by Duanne Guess, MD. With the following instrument(s): Curette to remove Non-Viable tissue/material. Material removed includes Eschar,  Subcutaneous Tissue, and Slough after achieving pain control using Lidocaine 4% T opical Solution. No specimens were taken. A time out was conducted at 10:33, prior to the start of the procedure. A Minimum amount of bleeding was controlled with Pressure. The procedure was tolerated well. Post Debridement Measurements: 2cm length x 1.5cm width x 0.1cm depth; 0.236cm^3 volume. Character of Wound/Ulcer Post Debridement is improved. Severity of Tissue Post Debridement is: Fat layer exposed. Post procedure Diagnosis Wound #9: Same as Pre-Procedure Plan Follow-up Appointments: Return Appointment in 1 week. - Dr. Lady Gary RM 2 Anesthetic: (In clinic) Topical Lidocaine 4% applied to wound bed Bathing/ Shower/ Hygiene: May shower with protection but do not get wound dressing(s) wet. Protect dressing(s) with water repellant cover (for example, large plastic bag) or a cast cover and may then take shower. Edema Control - Orders / Instructions: Elevate legs to the level of the heart or above for 30 minutes daily and/or when sitting for 3-4 times a day throughout the day. Avoid standing for long periods of time. Exercise regularly The following medication(s) was prescribed: lidocaine topical 4 % cream cream topical was prescribed at facility WOUND #8: - Lower Leg Wound Laterality: Right, Anterior, Proximal Cleanser: Soap and Water 1 x Per Week/30 Days Discharge Instructions: May shower and wash wound with dial antibacterial soap and water prior to dressing change. Cleanser: Wound Cleanser 1 x Per Week/30 Days Discharge Instructions: Cleanse the wound with wound cleanser prior to applying a clean dressing using gauze sponges, not tissue or cotton balls. Peri-Wound Care: Sween Lotion (Moisturizing lotion) 1 x Per Week/30 Days Discharge Instructions: Apply moisturizing lotion as directed Prim Dressing: Maxorb Extra Ag+ Alginate Dressing, 2x2 (in/in) 1 x Per Week/30 Days ary Discharge Instructions: Apply to  wound bed as instructed Secondary Dressing: ABD Pad, 8x10 1 x Per Week/30 Days Discharge Instructions: Apply over primary dressing as directed. Com pression Wrap: Urgo K2  Lite, (equivalent to a 3 layer) two layer compression system, regular 1 x Per Week/30 Days Discharge Instructions: Apply Urgo K2 Lite as directed (alternative to 3 layer compression). WOUND #9: - Lower Leg Wound Laterality: Right, Anterior, Distal Cleanser: Soap and Water 1 x Per Week/30 Days Discharge Instructions: May shower and wash wound with dial antibacterial soap and water prior to dressing change. Cleanser: Wound Cleanser 1 x Per Week/30 Days Discharge Instructions: Cleanse the wound with wound cleanser prior to applying a clean dressing using gauze sponges, not tissue or cotton balls. TEIGHLOR, EHLER ([id number]) [id number] Page 8 of 10 Peri-Wound Care: Sween Lotion (Moisturizing lotion) 1 x Per Week/30 Days Discharge Instructions: Apply moisturizing lotion as directed Prim Dressing: Maxorb Extra Ag+ Alginate Dressing, 2x2 (in/in) 1 x Per Week/30 Days ary Discharge Instructions: Apply to wound bed as instructed Secondary Dressing: ABD Pad, 8x10 1 x Per Week/30 Days Discharge Instructions: Apply over primary dressing as directed. Com pression Wrap: Urgo K2 Lite, (equivalent to a 3 layer) two layer compression system, regular 1 x Per Week/30 Days Discharge Instructions: Apply Urgo K2 Lite as directed (alternative to 3 layer compression). 05/12/2023: Both wounds measured smaller today. There is some slough and eschar accumulation on each. She saw her PCP who removed our wraps for some reason and therefore her edema control is not ideal. I used a curette to debride slough, eschar, and subcutaneous tissue from both wounds. We will continue with silver alginate and Urgo light compression. She apparently has a few more days left on the Augmentin and doxycycline that were prescribed by her PCP.  Follow-up in 1 week. Electronic Signature(s) Signed: 05/12/2023 10:53:44 AM By: Duanne Guess MD FACS Entered By: Duanne Guess on 05/12/2023 07:53:44 -------------------------------------------------------------------------------- HxROS Details Patient Name: Date of Service: [name]. 05/12/2023 10:00 A M Medical Record Number: [id number] Patient Account Number: [id number] Date of Birth/Sex: Treating RN: 1956/02/23 (67 y.o. F) Primary Care Provider: Alfredo Martinez Other Clinician: Referring Provider: Treating Provider/Extender: Dan Europe, [name] in Treatment: 1 Information Obtained From Patient Chart Eyes Medical History: Positive for: Cataracts Ear/Nose/Mouth/Throat Medical History: Past Medical History Notes: Presbycusis of both ears Hematologic/Lymphatic Medical History: Positive for: Anemia - Iron deficiency anemia Respiratory Medical History: Positive for: Sleep Apnea Cardiovascular Medical History: Positive for: Arrhythmia - Paroxysmal atrial fibrillation; Coronary Artery Disease; Hypertension; Peripheral Venous Disease - venous stasis and insufficiency Past Medical History Notes: Second degree AV block, Pacemaker Gastrointestinal Medical History: Past Medical History Notes: Spigelian hernia, Umbilical hernia, GERD, Rectal prolapse, Rectovaginal fistula, NASH (nonalcoholic steatohepatitis) Endocrine Primiano, Massa L ([id number]) [id number] Page 9 of 10 Medical History: Positive for: Type II Diabetes Past Medical History Notes: Hyperparathyroidism, GOITER, MULTINODULAR Time with diabetes: 28 years Genitourinary Medical History: Past Medical History Notes: Renal insufficiency Integumentary (Skin) Medical History: Past Medical History Notes: PSORIASIS Musculoskeletal Medical History: Positive for: Osteoarthritis Neurologic Medical History: Positive for: Neuropathy Psychiatric Medical  History: Past Medical History Notes: major depression HBO Extended History Items Eyes: Cataracts Immunizations Pneumococcal Vaccine: Received Pneumococcal Vaccination: No Implantable Devices No devices added Hospitalization / Surgery History Type of Hospitalization/Surgery Decompressive lumbar laminectomy level 2 Parathyroidectomy (Left) Pacemaker leadless insertion T knee arthroplasty (Right) otal stab phlebectomy (Right) T knee arthroplasty (Left) otal Laparoscopy Gastric bypass Electrocardiogram Cardiac catheterization Cataract extraction w/ intraocular lens implant, bilateral Colonoscopy Flexible sigmoidoscopy Leg surgery (Left) Comment: metal and pins in lower left leg Multiple tooth extractions Shoulder arthroscopy w/ rotator cuff repair (Right) Tonsillectomy Varicose vein surgery Family and Social History Unknown  History: Yes; Former smoker - ended on 06/25/1991; Marital Status - Married; Alcohol Use: Never; Drug Use: No History; Caffeine Use: Daily - coffee; Financial Concerns: No; Food, Clothing or Shelter Needs: No; Support System Lacking: No; Transportation Concerns: No Electronic Signature(s) Signed: 05/12/2023 11:05:23 AM By: Duanne Guess MD FACS Entered By: Duanne Guess on 05/12/2023 07:51:40 Rugg, Leotis Pain ([id number]) [id number] Page 10 of 10 -------------------------------------------------------------------------------- SuperBill Details Patient Name: Date of Service: DAEDRA, WICKES 05/12/2023 Medical Record Number: [id number] Patient Account Number: [id number] Date of Birth/Sex: Treating RN: 04-22-1956 (67 y.o. F) Primary Care Provider: Alfredo Martinez Other Clinician: Referring Provider: Treating Provider/Extender: Dan Europe, [name] in Treatment: 1 Diagnosis Coding ICD-10 Codes Code Description [phone] Non-pressure chronic ulcer of other part of right lower leg with fat layer  exposed E11.622 Type 2 diabetes mellitus with other skin ulcer R60.0 Localized edema Facility Procedures : 3 CPT4 Code: [id number] Description: 11042 - DEB SUBQ TISSUE 20 SQ CM/< ICD-10 Diagnosis Description L97.812 Non-pressure chronic ulcer of other part of right lower leg with fat layer exp Modifier: osed Quantity: 1 Physician Procedures : CPT4 Code Description Modifier [id number] 99214 - WC PHYS LEVEL 4 - EST PT ICD-10 Diagnosis Description L97.812 Non-pressure chronic ulcer of other part of right lower leg with fat layer exposed E11.622 Type 2 diabetes mellitus with other skin ulcer R60.0  Localized edema Quantity: 1 : [id number] 11042 - WC PHYS SUBQ TISS 20 SQ CM ICD-10 Diagnosis Description L97.812 Non-pressure chronic ulcer of other part of right lower leg with fat layer exposed Quantity: 1 Electronic Signature(s) Signed: 05/12/2023 10:53:59 AM By: Duanne Guess MD FACS Entered By: Duanne Guess on 05/12/2023 07:53:59

## 2023-05-14 ENCOUNTER — Encounter: Payer: Self-pay | Admitting: Student

## 2023-05-20 ENCOUNTER — Encounter (HOSPITAL_BASED_OUTPATIENT_CLINIC_OR_DEPARTMENT_OTHER): Payer: Medicare Other | Admitting: General Surgery

## 2023-05-20 DIAGNOSIS — M1990 Unspecified osteoarthritis, unspecified site: Secondary | ICD-10-CM | POA: Diagnosis not present

## 2023-05-20 DIAGNOSIS — I872 Venous insufficiency (chronic) (peripheral): Secondary | ICD-10-CM | POA: Diagnosis not present

## 2023-05-20 DIAGNOSIS — E1140 Type 2 diabetes mellitus with diabetic neuropathy, unspecified: Secondary | ICD-10-CM | POA: Diagnosis not present

## 2023-05-20 DIAGNOSIS — L97812 Non-pressure chronic ulcer of other part of right lower leg with fat layer exposed: Secondary | ICD-10-CM | POA: Diagnosis not present

## 2023-05-20 DIAGNOSIS — R600 Localized edema: Secondary | ICD-10-CM | POA: Diagnosis not present

## 2023-05-20 DIAGNOSIS — Z87891 Personal history of nicotine dependence: Secondary | ICD-10-CM | POA: Diagnosis not present

## 2023-05-20 DIAGNOSIS — E11622 Type 2 diabetes mellitus with other skin ulcer: Secondary | ICD-10-CM | POA: Diagnosis not present

## 2023-05-21 NOTE — Progress Notes (Signed)
KOOPER, CALDARERA ([id number]) [id number] Page 1 of 10 Visit Report for 05/20/2023 Chief Complaint Document Details Patient Name: Date of Service: [name], [name] 05/20/2023 4:00 PM Medical Record Number: [id number] Patient Account Number: [id number] Date of Birth/Sex: Treating RN: 07-02-1955 (67 y.o. F) Primary Care Provider: Alfredo Martinez Other Clinician: Referring Provider: Treating Provider/Extender: Dan Europe, [name] in Treatment: 2 Information Obtained from: Patient Chief Complaint Patient presents for treatment of an open ulcer due to venous insufficiency Electronic Signature(s) Signed: 05/20/2023 5:25:28 PM By: Duanne Guess MD FACS Entered By: Duanne Guess on 05/20/2023 17:25:28 -------------------------------------------------------------------------------- Debridement Details Patient Name: Date of Service: [name] 05/20/2023 4:00 PM Medical Record Number: [id number] Patient Account Number: [id number] Date of Birth/Sex: Treating RN: 18-Mar-1956 (67 y.o. [name] Primary Care Provider: Alfredo Martinez Other Clinician: Referring Provider: Treating Provider/Extender: Dan Europe, [name] in Treatment: 2 Debridement Performed for Assessment: Wound #8 Right,Proximal,Anterior Lower Leg Performed By: Physician Duanne Guess, MD The following information was scribed by: Samuella Bruin The information was scribed for: Duanne Guess Debridement Type: Debridement Severity of Tissue Pre Debridement: Fat layer exposed Level of Consciousness (Pre-procedure): Awake and Alert Pre-procedure Verification/Time Out Yes - 16:41 Taken: Start Time: 16:41 Pain Control: Lidocaine 4% Topical Solution Percent of Wound Bed Debrided: 100% T Area Debrided (cm): otal 0.27 Tissue and other material debrided: Non-Viable, Eschar, Slough, Subcutaneous, Slough Level: Skin/Subcutaneous  Tissue Debridement Description: Excisional Instrument: Curette Bleeding: Minimum Hemostasis Achieved: Pressure Response to Treatment: Procedure was tolerated well Level of Consciousness (Post- Awake and Alert procedure): Post Debridement Measurements of Total Wound Length: (cm) 0.5 Width: (cm) 0.7 Depth: (cm) 0.1 Volume: (cm) 0.027 Character of Wound/Ulcer Post Debridement: Improved Severity of Tissue Post Debridement: Fat layer exposed [name], [name] ([id number]) [id number] Page 2 of 10 Post Procedure Diagnosis Same as Pre-procedure Electronic Signature(s) Signed: 05/20/2023 5:01:06 PM By: Samuella Bruin Signed: 05/20/2023 5:33:59 PM By: Duanne Guess MD FACS Entered By: Samuella Bruin on 05/20/2023 16:42:15 -------------------------------------------------------------------------------- Debridement Details Patient Name: Date of Service: [name] 05/20/2023 4:00 PM Medical Record Number: [id number] Patient Account Number: [id number] Date of Birth/Sex: Treating RN: [birth year] (67 y.o. [name] Primary Care Provider: Alfredo Martinez Other Clinician: Referring Provider: Treating Provider/Extender: Dan Europe, [name] in Treatment: 2 Debridement Performed for Assessment: Wound #9 Right,Distal,Anterior Lower Leg Performed By: Physician Duanne Guess, MD The following information was scribed by: Samuella Bruin The information was scribed for: Duanne Guess Debridement Type: Debridement Severity of Tissue Pre Debridement: Fat layer exposed Level of Consciousness (Pre-procedure): Awake and Alert Pre-procedure Verification/Time Out Yes - 16:41 Taken: Start Time: 16:41 Pain Control: Lidocaine 4% Topical Solution Percent of Wound Bed Debrided: 100% T Area Debrided (cm): otal 3.92 Tissue and other material debrided: Non-Viable, Eschar, Slough, Subcutaneous, Slough Level: Skin/Subcutaneous  Tissue Debridement Description: Excisional Instrument: Curette Bleeding: Minimum Hemostasis Achieved: Pressure Response to Treatment: Procedure was tolerated well Level of Consciousness (Post- Awake and Alert procedure): Post Debridement Measurements of Total Wound Length: (cm) 2.5 Width: (cm) 2 Depth: (cm) 0.1 Volume: (cm) 0.393 Character of Wound/Ulcer Post Debridement: Improved Severity of Tissue Post Debridement: Fat layer exposed Post Procedure Diagnosis Same as Pre-procedure Electronic Signature(s) Signed: 05/20/2023 5:01:06 PM By: Samuella Bruin Signed: 05/20/2023 5:33:59 PM By: Duanne Guess MD FACS Entered By: Samuella Bruin on 05/20/2023 16:42:37 HPI Details -------------------------------------------------------------------------------- [name] ([id number]) [id number] Page 3 of 10 Patient Name: Date of Service: [name], [name] 05/20/2023 4:00 PM Medical Record Number: [id number] Patient Account Number: [id number]  Date of Birth/Sex: Treating RN: [birth year] (67 y.o. F) Primary Care Provider: Alfredo Martinez Other Clinician: Referring Provider: Treating Provider/Extender: Dan Europe, [name] in Treatment: 2 History of Present Illness HPI Description: ADMISSION 05/02/2023 ***ABIs R: 0.88, palpable pulses*** This is a 67 year old woman with well-controlled type 2 diabetes (last hemoglobin A1c 5.4%) presenting to the wound care center with two ulcers on her right lower leg. She saw her primary care provider earlier this week and per their note, she has had lower leg swelling and erythema for about 2 months. Her PCP prescribed Augmentin and doxycycline. 05/12/2023: Both wounds measured smaller today. There is some slough and eschar accumulation on each. She saw her PCP who removed our wraps for some reason and therefore her edema control is not ideal. 05/20/2023: The proximal wound measured smaller today. The  distal wound measured slightly larger, but this does incorporate a fair amount of scab/eschar. Edema control is much better this week. Electronic Signature(s) Signed: 05/20/2023 5:26:20 PM By: Duanne Guess MD FACS Entered By: Duanne Guess on 05/20/2023 17:26:20 -------------------------------------------------------------------------------- Physical Exam Details Patient Name: Date of Service: [name], [name] 05/20/2023 4:00 PM Medical Record Number: [id number] Patient Account Number: [id number] Date of Birth/Sex: Treating RN: [birth year] (67 y.o. F) Primary Care Provider: Alfredo Martinez Other Clinician: Referring Provider: Treating Provider/Extender: Dan Europe, [name] in Treatment: 2 Constitutional Hypertensive, asymptomatic. . . . no acute distress. Respiratory Normal work of breathing on room air.. Notes 05/20/2023: The proximal wound measured smaller today. The distal wound measured slightly larger, but this does incorporate a fair amount of scab/eschar. Edema control is much better this week. Electronic Signature(s) Signed: 05/20/2023 5:26:51 PM By: Duanne Guess MD FACS Entered By: Duanne Guess on 05/20/2023 17:26:51 -------------------------------------------------------------------------------- Physician Orders Details Patient Name: Date of Service: [name], [name] 05/20/2023 4:00 PM Medical Record Number: [id number] Patient Account Number: [id number] Date of Birth/Sex: Treating RN: 29-Dec-1955 (67 y.o. [name] Primary Care Provider: Alfredo Martinez Other Clinician: Referring Provider: Treating Provider/Extender: Dan Europe, [name] in Treatment: 2 The following information was scribed by: Maylia, Karapetyan, Leotis Pain ([id number]) [id number] Page 4 of 10 The information was scribed for: Duanne Guess Verbal / Phone Orders: No Diagnosis Coding ICD-10 Coding Code  Description [phone] Non-pressure chronic ulcer of other part of right lower leg with fat layer exposed E11.622 Type 2 diabetes mellitus with other skin ulcer R60.0 Localized edema Follow-up Appointments ppointment in 1 week. - Dr. Lady Gary RM 2 Return A Anesthetic (In clinic) Topical Lidocaine 4% applied to wound bed Bathing/ Shower/ Hygiene May shower with protection but do not get wound dressing(s) wet. Protect dressing(s) with water repellant cover (for example, large plastic bag) or a cast cover and may then take shower. Edema Control - Orders / Instructions Elevate legs to the level of the heart or above for 30 minutes daily and/or when sitting for 3-4 times a day throughout the day. Avoid standing for long periods of time. Exercise regularly Wound Treatment Wound #8 - Lower Leg Wound Laterality: Right, Anterior, Proximal Cleanser: Soap and Water 1 x Per Week/30 Days Discharge Instructions: May shower and wash wound with dial antibacterial soap and water prior to dressing change. Cleanser: Wound Cleanser 1 x Per Week/30 Days Discharge Instructions: Cleanse the wound with wound cleanser prior to applying a clean dressing using gauze sponges, not tissue or cotton balls. Peri-Wound Care: Sween Lotion (Moisturizing lotion) 1 x Per Week/30 Days Discharge Instructions: Apply moisturizing lotion as directed Prim Dressing: Maxorb  Extra Ag+ Alginate Dressing, 2x2 (in/in) 1 x Per Week/30 Days ary Discharge Instructions: Apply to wound bed as instructed Secondary Dressing: ABD Pad, 8x10 1 x Per Week/30 Days Discharge Instructions: Apply over primary dressing as directed. Compression Wrap: Urgo K2 Lite, (equivalent to a 3 layer) two layer compression system, regular 1 x Per Week/30 Days Discharge Instructions: Apply Urgo K2 Lite as directed (alternative to 3 layer compression). Wound #9 - Lower Leg Wound Laterality: Right, Anterior, Distal Cleanser: Soap and Water 1 x Per Week/30  Days Discharge Instructions: May shower and wash wound with dial antibacterial soap and water prior to dressing change. Cleanser: Wound Cleanser 1 x Per Week/30 Days Discharge Instructions: Cleanse the wound with wound cleanser prior to applying a clean dressing using gauze sponges, not tissue or cotton balls. Peri-Wound Care: Sween Lotion (Moisturizing lotion) 1 x Per Week/30 Days Discharge Instructions: Apply moisturizing lotion as directed Prim Dressing: Maxorb Extra Ag+ Alginate Dressing, 2x2 (in/in) 1 x Per Week/30 Days ary Discharge Instructions: Apply to wound bed as instructed Secondary Dressing: ABD Pad, 8x10 1 x Per Week/30 Days Discharge Instructions: Apply over primary dressing as directed. Compression Wrap: Urgo K2 Lite, (equivalent to a 3 layer) two layer compression system, regular 1 x Per Week/30 Days Discharge Instructions: Apply Urgo K2 Lite as directed (alternative to 3 layer compression). Patient Medications llergies: cephalexin, latex, Westcort (neomycin-polymx-HC), adhesive tape A Notifications Medication Indication Start End 05/20/2023 lidocaine DOSE topical 4 % cream - cream topical [name], [name] ([id number]) [id number] Page 5 of 10 Electronic Signature(s) Signed: 05/20/2023 5:33:59 PM By: Duanne Guess MD FACS Previous Signature: 05/20/2023 5:01:06 PM Version By: Gelene Mink By: Duanne Guess on 05/20/2023 17:28:03 -------------------------------------------------------------------------------- Problem List Details Patient Name: Date of Service: [name] 05/20/2023 4:00 PM Medical Record Number: [id number] Patient Account Number: [id number] Date of Birth/Sex: Treating RN: May 18, 1956 (67 y.o. F) Primary Care Provider: Alfredo Martinez Other Clinician: Referring Provider: Treating Provider/Extender: Dan Europe, [name] in Treatment: 2 Active Problems ICD-10 Encounter Code Description  Active Date MDM Diagnosis L97.812 Non-pressure chronic ulcer of other part of right lower leg with fat layer 05/02/2023 No Yes exposed E11.622 Type 2 diabetes mellitus with other skin ulcer 05/02/2023 No Yes R60.0 Localized edema 05/02/2023 No Yes Inactive Problems Resolved Problems Electronic Signature(s) Signed: 05/20/2023 5:24:42 PM By: Duanne Guess MD FACS Entered By: Duanne Guess on 05/20/2023 17:24:42 -------------------------------------------------------------------------------- Progress Note Details Patient Name: Date of Service: [name] 05/20/2023 4:00 PM Medical Record Number: [id number] Patient Account Number: [id number] Date of Birth/Sex: Treating RN: 02-Feb-1956 (67 y.o. F) Primary Care Provider: Alfredo Martinez Other Clinician: Referring Provider: Treating Provider/Extender: Dan Europe, [name] in Treatment: 2 Subjective Chief Complaint Information obtained from Patient Patient presents for treatment of an open ulcer due to venous insufficiency History of Present Illness (HPI) ADMISSION [name], [name] ([id number]) [id number] Page 6 of 10 05/02/2023 ***ABIs R: 0.88, palpable pulses*** This is a 67 year old woman with well-controlled type 2 diabetes (last hemoglobin A1c 5.4%) presenting to the wound care center with two ulcers on her right lower leg. She saw her primary care provider earlier this week and per their note, she has had lower leg swelling and erythema for about 2 months. Her PCP prescribed Augmentin and doxycycline. 05/12/2023: Both wounds measured smaller today. There is some slough and eschar accumulation on each. She saw her PCP who removed our wraps for some reason and therefore her edema control is not ideal. 05/20/2023: The proximal wound measured  smaller today. The distal wound measured slightly larger, but this does incorporate a fair amount of scab/eschar. Edema control is much better this  week. Patient History Information obtained from Patient, Chart. Family History Unknown History. Social History Former smoker - ended on 06/25/1991, Marital Status - Married, Alcohol Use - Never, Drug Use - No History, Caffeine Use - Daily - coffee. Medical History Eyes Patient has history of Cataracts Hematologic/Lymphatic Patient has history of Anemia - Iron deficiency anemia Respiratory Patient has history of Sleep Apnea Cardiovascular Patient has history of Arrhythmia - Paroxysmal atrial fibrillation, Coronary Artery Disease, Hypertension, Peripheral Venous Disease - venous stasis and insufficiency Endocrine Patient has history of Type II Diabetes Musculoskeletal Patient has history of Osteoarthritis Neurologic Patient has history of Neuropathy Hospitalization/Surgery History - Decompressive lumbar laminectomy level 2. - Parathyroidectomy (Left). - Pacemaker leadless insertion. - T knee otal arthroplasty (Right). - stab phlebectomy (Right). - T knee arthroplasty (Left). - Laparoscopy. - Gastric bypass. - Electrocardiogram. - Cardiac otal catheterization. - Cataract extraction w/ intraocular lens implant, bilateral. - Colonoscopy. - Flexible sigmoidoscopy. - Leg surgery (Left) Comment: metal and pins in lower left leg. - Multiple tooth extractions. - Shoulder arthroscopy w/ rotator cuff repair (Right). - Tonsillectomy. - Varicose vein surgery. Medical A Surgical History Notes nd Ear/Nose/Mouth/Throat Presbycusis of both ears Cardiovascular Second degree AV block, Pacemaker Gastrointestinal Spigelian hernia, Umbilical hernia, GERD, Rectal prolapse, Rectovaginal fistula, NASH (nonalcoholic steatohepatitis) Endocrine Hyperparathyroidism, GOITER, MULTINODULAR Genitourinary Renal insufficiency Integumentary (Skin) PSORIASIS Psychiatric major depression Objective Constitutional Hypertensive, asymptomatic. no acute distress. Vitals Time Taken: 4:30 PM, Height: 70 in, Weight:  166 lbs, BMI: 23.8, Temperature: 98.3 F, Pulse: 83 bpm, Respiratory Rate: 18 breaths/min, Blood Pressure: 156/72 mmHg. Respiratory Normal work of breathing on room air.. General Notes: 05/20/2023: The proximal wound measured smaller today. The distal wound measured slightly larger, but this does incorporate a fair amount of scab/eschar. Edema control is much better this week. Integumentary (Hair, Skin) Wound #8 status is Open. Original cause of wound was Gradually Appeared. The date acquired was: 03/27/2023. The wound has been in treatment 2 weeks. The wound is located on the Right,Proximal,Anterior Lower Leg. The wound measures 0.5cm length x 0.7cm width x 0.1cm depth; 0.275cm^2 area and 0.027cm^3 volume. There is Fat Layer (Subcutaneous Tissue) exposed. There is no tunneling or undermining noted. There is a medium amount of serosanguineous drainage noted. The wound margin is distinct with the outline attached to the wound base. There is small (1-33%) pink granulation within the wound bed. There is BLUE, DALBY ([id number]) [phone].pdf Page 7 of 10 a large (67-100%) amount of necrotic tissue within the wound bed including Adherent Slough. The periwound skin appearance had no abnormalities noted for texture. The periwound skin appearance had no abnormalities noted for moisture. The periwound skin appearance had no abnormalities noted for color. Periwound temperature was noted as No Abnormality. Wound #9 status is Open. Original cause of wound was Trauma. The date acquired was: 04/10/2023. The wound has been in treatment 2 weeks. The wound is located on the Right,Distal,Anterior Lower Leg. The wound measures 2.5cm length x 2cm width x 0.1cm depth; 3.927cm^2 area and 0.393cm^3 volume. There is Fat Layer (Subcutaneous Tissue) exposed. There is no tunneling or undermining noted. There is a medium amount of sanguinous drainage noted. The wound margin is distinct with the  outline attached to the wound base. There is small (1-33%) red granulation within the wound bed. There is a large (67-100%) amount of necrotic tissue within the wound  bed including Eschar. The periwound skin appearance had no abnormalities noted for texture. The periwound skin appearance had no abnormalities noted for color. The periwound skin appearance exhibited: Maceration. The periwound skin appearance did not exhibit: Dry/Scaly. Periwound temperature was noted as No Abnormality. Assessment Active Problems ICD-10 Non-pressure chronic ulcer of other part of right lower leg with fat layer exposed Type 2 diabetes mellitus with other skin ulcer Localized edema Procedures Wound #8 Pre-procedure diagnosis of Wound #8 is a Venous Leg Ulcer located on the Right,Proximal,Anterior Lower Leg .Severity of Tissue Pre Debridement is: Fat layer exposed. There was a Excisional Skin/Subcutaneous Tissue Debridement with a total area of 0.27 sq cm performed by Duanne Guess, MD. With the following instrument(s): Curette to remove Non-Viable tissue/material. Material removed includes Eschar, Subcutaneous Tissue, and Slough after achieving pain control using Lidocaine 4% T opical Solution. No specimens were taken. A time out was conducted at 16:41, prior to the start of the procedure. A Minimum amount of bleeding was controlled with Pressure. The procedure was tolerated well. Post Debridement Measurements: 0.5cm length x 0.7cm width x 0.1cm depth; 0.027cm^3 volume. Character of Wound/Ulcer Post Debridement is improved. Severity of Tissue Post Debridement is: Fat layer exposed. Post procedure Diagnosis Wound #8: Same as Pre-Procedure Wound #9 Pre-procedure diagnosis of Wound #9 is a Venous Leg Ulcer located on the Right,Distal,Anterior Lower Leg .Severity of Tissue Pre Debridement is: Fat layer exposed. There was a Excisional Skin/Subcutaneous Tissue Debridement with a total area of 3.92 sq cm performed by  Duanne Guess, MD. With the following instrument(s): Curette to remove Non-Viable tissue/material. Material removed includes Eschar, Subcutaneous Tissue, and Slough after achieving pain control using Lidocaine 4% T opical Solution. No specimens were taken. A time out was conducted at 16:41, prior to the start of the procedure. A Minimum amount of bleeding was controlled with Pressure. The procedure was tolerated well. Post Debridement Measurements: 2.5cm length x 2cm width x 0.1cm depth; 0.393cm^3 volume. Character of Wound/Ulcer Post Debridement is improved. Severity of Tissue Post Debridement is: Fat layer exposed. Post procedure Diagnosis Wound #9: Same as Pre-Procedure Pre-procedure diagnosis of Wound #9 is a Venous Leg Ulcer located on the Right,Distal,Anterior Lower Leg . There was a Double Layer Compression Therapy Procedure by Samuella Bruin, RN. Post procedure Diagnosis Wound #9: Same as Pre-Procedure Plan Follow-up Appointments: Return Appointment in 1 week. - Dr. Lady Gary RM 2 Anesthetic: (In clinic) Topical Lidocaine 4% applied to wound bed Bathing/ Shower/ Hygiene: May shower with protection but do not get wound dressing(s) wet. Protect dressing(s) with water repellant cover (for example, large plastic bag) or a cast cover and may then take shower. Edema Control - Orders / Instructions: Elevate legs to the level of the heart or above for 30 minutes daily and/or when sitting for 3-4 times a day throughout the day. Avoid standing for long periods of time. Exercise regularly The following medication(s) was prescribed: lidocaine topical 4 % cream cream topical was prescribed at facility WOUND #8: - Lower Leg Wound Laterality: Right, Anterior, Proximal Cleanser: Soap and Water 1 x Per Week/30 Days Discharge Instructions: May shower and wash wound with dial antibacterial soap and water prior to dressing change. Cleanser: Wound Cleanser 1 x Per Week/30 Days Discharge  Instructions: Cleanse the wound with wound cleanser prior to applying a clean dressing using gauze sponges, not tissue or cotton balls. Peri-Wound Care: Sween Lotion (Moisturizing lotion) 1 x Per Week/30 Days Discharge Instructions: Apply moisturizing lotion as directed Prim Dressing: Maxorb Extra Ag+  Alginate Dressing, 2x2 (in/in) 1 x Per Week/30 Days ary Discharge Instructions: Apply to wound bed as instructed Secondary Dressing: ABD Pad, 8x10 1 x Per Week/30 Days Discharge Instructions: Apply over primary dressing as directed. [name], [name] ([id number]) [id number] Page 8 of 10 Com pression Wrap: Urgo K2 Lite, (equivalent to a 3 layer) two layer compression system, regular 1 x Per Week/30 Days Discharge Instructions: Apply Urgo K2 Lite as directed (alternative to 3 layer compression). WOUND #9: - Lower Leg Wound Laterality: Right, Anterior, Distal Cleanser: Soap and Water 1 x Per Week/30 Days Discharge Instructions: May shower and wash wound with dial antibacterial soap and water prior to dressing change. Cleanser: Wound Cleanser 1 x Per Week/30 Days Discharge Instructions: Cleanse the wound with wound cleanser prior to applying a clean dressing using gauze sponges, not tissue or cotton balls. Peri-Wound Care: Sween Lotion (Moisturizing lotion) 1 x Per Week/30 Days Discharge Instructions: Apply moisturizing lotion as directed Prim Dressing: Maxorb Extra Ag+ Alginate Dressing, 2x2 (in/in) 1 x Per Week/30 Days ary Discharge Instructions: Apply to wound bed as instructed Secondary Dressing: ABD Pad, 8x10 1 x Per Week/30 Days Discharge Instructions: Apply over primary dressing as directed. Com pression Wrap: Urgo K2 Lite, (equivalent to a 3 layer) two layer compression system, regular 1 x Per Week/30 Days Discharge Instructions: Apply Urgo K2 Lite as directed (alternative to 3 layer compression). 05/20/2023: The proximal wound measured smaller today. The distal  wound measured slightly larger, but this does incorporate a fair amount of scab/eschar. Edema control is much better this week. I used a curette to debride slough and subcutaneous tissue from the proximal wound. I debrided eschar, slough, and subcutaneous tissue from the distal wound. There is actually a fair amount of epithelialization underneath the eschar. We will continue silver alginate to both sites and Urgo light compression wrap. She will have nurse visits for the next couple of weeks due to clinic scheduling and provider availability. She will follow-up with me in 3 weeks. Electronic Signature(s) Signed: 05/20/2023 5:33:20 PM By: Duanne Guess MD FACS Entered By: Duanne Guess on 05/20/2023 17:33:19 -------------------------------------------------------------------------------- HxROS Details Patient Name: Date of Service: [name] 05/20/2023 4:00 PM Medical Record Number: [id number] Patient Account Number: [id number] Date of Birth/Sex: Treating RN: [birth year] (67 y.o. F) Primary Care Provider: Alfredo Martinez Other Clinician: Referring Provider: Treating Provider/Extender: Dan Europe, [name] in Treatment: 2 Information Obtained From Patient Chart Eyes Medical History: Positive for: Cataracts Ear/Nose/Mouth/Throat Medical History: Past Medical History Notes: Presbycusis of both ears Hematologic/Lymphatic Medical History: Positive for: Anemia - Iron deficiency anemia Respiratory Medical History: Positive for: Sleep Apnea Cardiovascular Medical History: Positive for: Arrhythmia - Paroxysmal atrial fibrillation; Coronary Artery Disease; Hypertension; Peripheral Venous Disease - venous stasis and insufficiency Past Medical History Notes: Second degree AV block, Pacemaker [name], [name] ([id number]) [id number] Page 9 of 10 Gastrointestinal Medical History: Past Medical History Notes: Spigelian hernia, Umbilical  hernia, GERD, Rectal prolapse, Rectovaginal fistula, NASH (nonalcoholic steatohepatitis) Endocrine Medical History: Positive for: Type II Diabetes Past Medical History Notes: Hyperparathyroidism, GOITER, MULTINODULAR Time with diabetes: 28 years Genitourinary Medical History: Past Medical History Notes: Renal insufficiency Integumentary (Skin) Medical History: Past Medical History Notes: PSORIASIS Musculoskeletal Medical History: Positive for: Osteoarthritis Neurologic Medical History: Positive for: Neuropathy Psychiatric Medical History: Past Medical History Notes: major depression HBO Extended History Items Eyes: Cataracts Immunizations Pneumococcal Vaccine: Received Pneumococcal Vaccination: No Implantable Devices No devices added Hospitalization / Surgery History Type of Hospitalization/Surgery Decompressive lumbar laminectomy level 2 Parathyroidectomy (  Left) Pacemaker leadless insertion T knee arthroplasty (Right) otal stab phlebectomy (Right) T knee arthroplasty (Left) otal Laparoscopy Gastric bypass Electrocardiogram Cardiac catheterization Cataract extraction w/ intraocular lens implant, bilateral Colonoscopy Flexible sigmoidoscopy Leg surgery (Left) Comment: metal and pins in lower left leg Multiple tooth extractions Shoulder arthroscopy w/ rotator cuff repair (Right) Tonsillectomy Varicose vein surgery Family and Social History Unknown History: Yes; Former smoker - ended on 06/25/1991; Marital Status - Married; Alcohol Use: Never; Drug Use: No History; Caffeine Use: Daily ASHTON, WOODROW ([id number]) [id number] Page 10 of 10 coffee; Financial Concerns: No; Food, Clothing or Shelter Needs: No; Support System Lacking: No; Transportation Concerns: No Electronic Signature(s) Signed: 05/20/2023 5:33:59 PM By: Duanne Guess MD FACS Entered By: Duanne Guess on 05/20/2023  17:26:27 -------------------------------------------------------------------------------- SuperBill Details Patient Name: Date of Service: [name] 05/20/2023 Medical Record Number: [id number] Patient Account Number: [id number] Date of Birth/Sex: Treating RN: Nov 16, 1955 (67 y.o. F) Primary Care Provider: Alfredo Martinez Other Clinician: Referring Provider: Treating Provider/Extender: Dan Europe, [name] in Treatment: 2 Diagnosis Coding ICD-10 Codes Code Description [id number] Non-pressure chronic ulcer of other part of right lower leg with fat layer exposed E11.622 Type 2 diabetes mellitus with other skin ulcer R60.0 Localized edema Facility Procedures : 3 CPT4 Code: 2952841 Description: 11042 - DEB SUBQ TISSUE 20 SQ CM/< ICD-10 Diagnosis Description L97.812 Non-pressure chronic ulcer of other part of right lower leg with fat layer exp E11.622 Type 2 diabetes mellitus with other skin ulcer R60.0 Localized edema Modifier: osed Quantity: 1 Physician Procedures : CPT4 Code Description Modifier 3244010 11042 - WC PHYS SUBQ TISS 20 SQ CM ICD-10 Diagnosis Description L97.812 Non-pressure chronic ulcer of other part of right lower leg with fat layer exposed E11.622 Type 2 diabetes mellitus with other skin ulcer  R60.0 Localized edema Quantity: 1 Electronic Signature(s) Signed: 05/20/2023 5:33:31 PM By: Duanne Guess MD FACS Entered By: Duanne Guess on 05/20/2023 17:33:31

## 2023-05-21 NOTE — Progress Notes (Addendum)
KELLYN, KUTSCH ([id number]) [id number] Page 1 of 10 Visit Report for 05/20/2023 Arrival Information Details Patient Name: Date of Service: [name], [name] 05/20/2023 4:00 PM Medical Record Number: [id number] Patient Account Number: [id number] Date of Birth/Sex: Treating RN: [birth year] (67 y.o. Fredderick Phenix Primary Care Kayann Maj: Alfredo Martinez Other Clinician: Referring Kelon Easom: Treating Tynell Winchell/Extender: Dan Europe, [name] in Treatment: 2 Visit Information History Since Last Visit Added or deleted any medications: No Patient Arrived: [place] Mor Any new allergies or adverse reactions: No Arrival Time: 16:27 Had a fall or experienced change in No Accompanied By: self activities of daily living that may affect Transfer Assistance: None risk of falls: Patient Identification Verified: Yes Signs or symptoms of abuse/neglect since last visito No Secondary Verification Process Completed: Yes Hospitalized since last visit: No Patient Has Alerts: Yes Implantable device outside of the clinic excluding No Patient Alerts: Patient on Blood Thinner cellular tissue based products placed in the center since last visit: Has Dressing in Place as Prescribed: Yes Has Compression in Place as Prescribed: Yes Pain Present Now: Yes Electronic Signature(s) Signed: 05/20/2023 5:01:06 PM By: Samuella Bruin Entered By: Samuella Bruin on 05/20/2023 16:28:08 -------------------------------------------------------------------------------- Compression Therapy Details Patient Name: Date of Service: [name] 05/20/2023 4:00 PM Medical Record Number: [id number] Patient Account Number: [id number] Date of Birth/Sex: Treating RN: 05/13/1956 (67 y.o. Fredderick Phenix Primary Care Yolanda Dockendorf: Alfredo Martinez Other Clinician: Referring Mikhai Bienvenue: Treating Xylon Croom/Extender: Dan Europe, [name] in Treatment: 2 Compression Therapy  Performed for Wound Assessment: Wound #9 Right,Distal,Anterior Lower Leg Performed By: Clinician Samuella Bruin, RN Compression Type: Double Layer Post Procedure Diagnosis Same as Pre-procedure Electronic Signature(s) Signed: 05/20/2023 5:01:06 PM By: Samuella Bruin Entered By: Samuella Bruin on 05/20/2023 16:42:57 Frankl, Jori L ([id number]) [id number] Page 2 of 10 -------------------------------------------------------------------------------- Encounter Discharge Information Details Patient Name: Date of Service: [name], [name] 05/20/2023 4:00 PM Medical Record Number: [id number] Patient Account Number: [id number] Date of Birth/Sex: Treating RN: [birth year] (67 y.o. Fredderick Phenix Primary Care Ryah Cribb: Alfredo Martinez Other Clinician: Referring Zamariya Neal: Treating Jennah Satchell/Extender: Dan Europe, [name] in Treatment: 2 Encounter Discharge Information Items Post Procedure Vitals Discharge Condition: Stable Temperature (F): 98.3 Ambulatory Status: Cane Pulse (bpm): 83 Discharge Destination: Home Respiratory Rate (breaths/min): 18 Transportation: Private Auto Blood Pressure (mmHg): 156/72 Accompanied By: self Schedule Follow-up Appointment: Yes Clinical Summary of Care: Patient Declined Electronic Signature(s) Signed: 05/20/2023 5:01:06 PM By: Samuella Bruin Entered By: Samuella Bruin on 05/20/2023 16:56:48 -------------------------------------------------------------------------------- Lower Extremity Assessment Details Patient Name: Date of Service: [name], [name] 05/20/2023 4:00 PM Medical Record Number: [id number] Patient Account Number: [id number] Date of Birth/Sex: Treating RN: 20-Apr-1956 (67 y.o. Fredderick Phenix Primary Care Codi Folkerts: Alfredo Martinez Other Clinician: Referring Suhayb Anzalone: Treating Jaila Schellhorn/Extender: Dan Europe, [name] in Treatment: 2 Edema  Assessment Assessed: [Left: No] [Right: No] Edema: [Left: Ye] [Right: s] Calf Left: Right: Point of Measurement: 36 cm From Medial Instep 34.5 cm Ankle Left: Right: Point of Measurement: 9 cm From Medial Instep 24 cm Vascular Assessment Pulses: Dorsalis Pedis Palpable: [Right:Yes] Extremity colors, hair growth, and conditions: Extremity Color: [Right:Red] Hair Growth on Extremity: [Right:No] Temperature of Extremity: [Right:Warm] Capillary Refill: [Right:< 3 seconds] Dependent Rubor: [Right:No No] Electronic Signature(s) Signed: 05/20/2023 5:01:06 PM By: Samuella Bruin Entered By: Samuella Bruin on 05/20/2023 16:36:45 Cudmore, Christalynn L ([id number]) [id number] Page 3 of 10 -------------------------------------------------------------------------------- Multi Wound Chart Details Patient Name: Date of Service: [name], [name] 05/20/2023 4:00 PM Medical Record Number: [id number] Patient Account Number: [id number] Date of  Birth/Sex: Treating RN: May 15, 1956 (67 y.o. F) Primary Care Giavonni Fonder: Alfredo Martinez Other Clinician: Referring Tatem Holsonback: Treating Aaralynn Shepheard/Extender: Dan Europe, [name] in Treatment: 2 Vital Signs Height(in): 70 Pulse(bpm): 83 Weight(lbs): 166 Blood Pressure(mmHg): 156/72 Body Mass Index(BMI): 23.8 Temperature(F): 98.3 Respiratory Rate(breaths/min): 18 [8:Photos: No Photos Right, Proximal, Anterior Lower Leg Right, Distal, Anterior Lower Leg Wound Location: Gradually Appeared Wounding Event: Venous Leg Ulcer Primary Etiology: N/A Secondary Etiology: Cataracts, Anemia, Sleep Apnea, Comorbid History: Arrhythmia,  Coronary Artery Disease, Arrhythmia, Coronary Artery Disease, Hypertension, Peripheral Venous Disease, Type II Diabetes, Osteoarthritis, Neuropathy 03/27/2023 Date Acquired: 2 Weeks of Treatment: Open Wound Status: No Wound Recurrence: 0.5x0.7x0.1  Measurements L x W x D (cm) 0.275 A (cm) : rea 0.027  Volume (cm) : 65.00% % Reduction in A rea: 65.80% % Reduction in Volume: Full Thickness Without Exposed Classification: Support Structures Medium Exudate A mount: Serosanguineous Exudate Type: red,  brown Exudate Color: Distinct, outline attached Wound Margin: Small (1-33%) Granulation A mount: Pink Granulation Quality: Large (67-100%) Necrotic A mount: Adherent Slough Necrotic Tissue: Fat Layer (Subcutaneous Tissue): Yes Fat Layer (Subcutaneous  Tissue): Yes N/A Exposed Structures: Fascia: No Tendon: No Muscle: No Joint: No Bone: No Small (1-33%) Epithelialization: Debridement - Excisional Debridement: Pre-procedure Verification/Time Out 16:41 Taken: Lidocaine 4% Topical Solution Pain Control:  Necrotic/Eschar, Subcutaneous, Tissue Debrided: Slough Skin/Subcutaneous Tissue Level: 0.27 Debridement A (sq cm): rea Curette Instrument: Minimum Bleeding: Pressure Hemostasis Achieved: Debridement Treatment Response: Procedure was tolerated well Post  Debridement Measurements L x 0.5x0.7x0.1 W x D (cm) 0.027 Post Debridement Volume: (cm) Excoriation: No Periwound Skin Texture: Induration: No Callus: No Crepitus: No Rash: No Scarring: No] [9:No Photos Trauma Venous Leg Ulcer Diabetic Wound/Ulcer of the  Lower Extremity Cataracts, Anemia, Sleep Apnea, Hypertension, Peripheral Venous Disease, Type II Diabetes, Osteoarthritis, Neuropathy 04/10/2023 2 Open No 2.5x2x0.1 3.927 0.393 -20.80% -20.90% Full Thickness Without Exposed Support Structures Medium  Sanguinous red Distinct, outline attached Small (1-33%) Red Large (67-100%) Eschar Fascia: No Tendon: No Muscle: No Joint: No Bone: No Small (1-33%) Debridement - Excisional 16:41 Lidocaine 4% Topical Solution Necrotic/Eschar, Subcutaneous, Slough  Skin/Subcutaneous Tissue 3.92 Curette Minimum Pressure Procedure was tolerated well 2.5x2x0.1 0.393 Excoriation: No Induration: No Callus: No Crepitus: No Rash: No Scarring: No] [N/A:N/A N/A N/A N/A N/A N/A N/A N/A N/A N/A N/A  N/A N/A N/A N/A N/A N/A N/A  N/A N/A N/A N/A N/A N/A N/A N/A N/A N/A N/A N/A N/A N/A N/A N/A N/A N/A N/A N/A] Broccoli, Breckin L ([id number]) [8:Maceration: No Periwound Skin Moisture: Dry/Scaly: No Atrophie Blanche: No Periwound Skin Color: Cyanosis: No Ecchymosis: No Erythema: No Hemosiderin Staining: No Mottled: No Pallor: No Rubor: No No Abnormality Temperature: Debridement Procedures  Performed:] [9:Maceration: Yes Dry/Scaly: No Erythema: Yes Atrophie Blanche: No Cyanosis: No Ecchymosis: No Hemosiderin Staining: No Mottled: No Pallor: No Rubor: No No Abnormality Compression Therapy Debridement]  [[id number] Page 4 of 10 N/A N/A N/A N/A] Treatment Notes Wound #8 (Lower Leg) Wound Laterality: Right, Anterior, Proximal Cleanser Soap and Water Discharge Instruction: May shower and wash wound with dial antibacterial soap and water prior to dressing change. Wound Cleanser Discharge Instruction: Cleanse the wound with wound cleanser prior to applying a clean dressing using gauze sponges, not tissue or cotton balls. Peri-Wound Care Sween Lotion (Moisturizing lotion) Discharge Instruction: Apply moisturizing lotion as directed Topical Primary Dressing Maxorb Extra Ag+ Alginate Dressing, 2x2 (in/in) Discharge Instruction: Apply to wound bed as instructed Secondary Dressing ABD Pad, 8x10 Discharge Instruction: Apply over primary dressing as  directed. Secured With Compression Wrap Urgo K2 Lite, (equivalent to a 3 layer) two layer compression system, regular Discharge Instruction: Apply Urgo K2 Lite as directed (alternative to 3 layer compression). Compression Stockings Add-Ons Wound #9 (Lower Leg) Wound Laterality: Right, Anterior, Distal Cleanser Soap and Water Discharge Instruction: May shower and wash wound with dial antibacterial soap and water prior to dressing change. Wound Cleanser Discharge Instruction: Cleanse the wound with wound cleanser prior to  applying a clean dressing using gauze sponges, not tissue or cotton balls. Peri-Wound Care Sween Lotion (Moisturizing lotion) Discharge Instruction: Apply moisturizing lotion as directed Topical Primary Dressing Maxorb Extra Ag+ Alginate Dressing, 2x2 (in/in) Discharge Instruction: Apply to wound bed as instructed Secondary Dressing ABD Pad, 8x10 Discharge Instruction: Apply over primary dressing as directed. Secured With Compression Wrap Urgo K2 Lite, (equivalent to a 3 layer) two layer compression system, regular Discharge Instruction: Apply Urgo K2 Lite as directed (alternative to 3 layer compression). Compression Stockings ALEGRIA, STAKES ([id number]) [id number] Page 5 of 10 Add-Ons Electronic Signature(s) Signed: 05/20/2023 5:24:54 PM By: Duanne Guess MD FACS Entered By: Duanne Guess on 05/20/2023 17:24:54 -------------------------------------------------------------------------------- Multi-Disciplinary Care Plan Details Patient Name: Date of Service: ADNA, WARHURST 05/20/2023 4:00 PM Medical Record Number: [id number] Patient Account Number: [id number] Date of Birth/Sex: Treating RN: 01-Sep-1955 (67 y.o. Fredderick Phenix Primary Care Robbie Rideaux: Alfredo Martinez Other Clinician: Referring Kenlee Maler: Treating Goku Harb/Extender: Dan Europe, [name] in Treatment: 2 Active Inactive Orientation to the Wound Care Program Nursing Diagnoses: Knowledge deficit related to the wound healing center program Goals: Patient/caregiver will verbalize understanding of the Wound Healing Center Program Date Initiated: 05/12/2023 Target Resolution Date: 05/30/2023 Goal Status: Active Interventions: Provide education on orientation to the wound center Notes: Wound/Skin Impairment Nursing Diagnoses: Impaired tissue integrity Knowledge deficit related to ulceration/compromised skin integrity Goals: Patient/caregiver will verbalize  understanding of skin care regimen Date Initiated: 05/12/2023 Target Resolution Date: 06/27/2023 Goal Status: Active Ulcer/skin breakdown will heal within 14 weeks Date Initiated: 05/12/2023 Target Resolution Date: 06/06/2023 Goal Status: Active Interventions: Assess ulceration(s) every visit Treatment Activities: Skin care regimen initiated : 05/12/2023 Topical wound management initiated : 05/12/2023 Notes: Electronic Signature(s) Signed: 05/20/2023 5:01:06 PM By: Samuella Bruin Entered By: Samuella Bruin on 05/20/2023 16:45:07 Hazard, Kasumi L ([id number]) [id number] Page 6 of 10 -------------------------------------------------------------------------------- Pain Assessment Details Patient Name: Date of Service: [name], [name] 05/20/2023 4:00 PM Medical Record Number: [id number] Patient Account Number: [id number] Date of Birth/Sex: Treating RN: 1955-12-29 (67 y.o. Fredderick Phenix Primary Care Searra Carnathan: Alfredo Martinez Other Clinician: Referring Graviela Nodal: Treating Hikeem Andersson/Extender: Dan Europe, [name] in Treatment: 2 Active Problems Location of Pain Severity and Description of Pain Patient Has Paino Yes Site Locations Pain Location: Generalized Pain Duration of the Pain. Constant / Intermittento Constant Rate the pain. Current Pain Level: 5 Character of Pain Describe the Pain: Other: neuropathy Pain Management and Medication Current Pain Management: Medication: Yes Electronic Signature(s) Signed: 05/20/2023 5:01:06 PM By: Samuella Bruin Entered By: Samuella Bruin on 05/20/2023 16:29:38 -------------------------------------------------------------------------------- Patient/Caregiver Education Details Patient Name: Date of Service: [name] 11/26/2024andnbsp4:00 PM Medical Record Number: [id number] Patient Account Number: [id number] Date of Birth/Gender: Treating RN: 12/15/1955 (67 y.o. Fredderick Phenix Primary Care Physician: Alfredo Martinez Other Clinician: Referring Physician: Treating Physician/Extender: Candelaria Celeste in Treatment: 2 Education Assessment Education Provided To: Patient Education Topics Provided Wound/Skin ImpairmentTARANEH, [name] ([id number]) [id number] Page 7 of 10 Methods: Explain/Verbal Responses: Reinforcements needed, State content correctly Electronic Signature(s) Signed: 05/20/2023 5:01:06  PM By: Gelene Mink By: Samuella Bruin on 05/20/2023 16:45:18 -------------------------------------------------------------------------------- Wound Assessment Details Patient Name: Date of Service: [name], [name] 05/20/2023 4:00 PM Medical Record Number: [id number] Patient Account Number: [id number] Date of Birth/Sex: Treating RN: 1955/12/19 (67 y.o. Fredderick Phenix Primary Care Kei Mcelhiney: Alfredo Martinez Other Clinician: Referring Alvita Fana: Treating Forrest Jaroszewski/Extender: Dan Europe, [name] in Treatment: 2 Wound Status Wound Number: 8 Primary Venous Leg Ulcer Etiology: Wound Location: Right, Proximal, Anterior Lower Leg Wound Open Wounding Event: Gradually Appeared Status: Date Acquired: 03/27/2023 Comorbid Cataracts, Anemia, Sleep Apnea, Arrhythmia, Coronary Artery Weeks Of Treatment: 2 History: Disease, Hypertension, Peripheral Venous Disease, Type II Clustered Wound: No Diabetes, Osteoarthritis, Neuropathy Wound Measurements Length: (cm) 0.5 Width: (cm) 0.7 Depth: (cm) 0.1 Area: (cm) 0.275 Volume: (cm) 0.027 % Reduction in Area: 65% % Reduction in Volume: 65.8% Epithelialization: Small (1-33%) Tunneling: No Undermining: No Wound Description Classification: Full Thickness Without Exposed Support Structures Wound Margin: Distinct, outline attached Exudate Amount: Medium Exudate Type: Serosanguineous Exudate Color: red, brown Foul Odor After  Cleansing: No Slough/Fibrino Yes Wound Bed Granulation Amount: Small (1-33%) Exposed Structure Granulation Quality: Pink Fascia Exposed: No Necrotic Amount: Large (67-100%) Fat Layer (Subcutaneous Tissue) Exposed: Yes Necrotic Quality: Adherent Slough Tendon Exposed: No Muscle Exposed: No Joint Exposed: No Bone Exposed: No Periwound Skin Texture Texture Color No Abnormalities Noted: Yes No Abnormalities Noted: Yes Moisture Temperature / Pain No Abnormalities Noted: Yes Temperature: No Abnormality Treatment Notes Wound #8 (Lower Leg) Wound Laterality: Right, Anterior, Proximal Cleanser Soap and Water Discharge Instruction: May shower and wash wound with dial antibacterial soap and water prior to dressing change. Wound Cleanser Discharge Instruction: Cleanse the wound with wound cleanser prior to applying a clean dressing using gauze sponges, not tissue or cotton balls. LITSY, DRAKEFORD ([id number]) [id number] Page 8 of 10 Peri-Wound Care Sween Lotion (Moisturizing lotion) Discharge Instruction: Apply moisturizing lotion as directed Topical Primary Dressing Maxorb Extra Ag+ Alginate Dressing, 2x2 (in/in) Discharge Instruction: Apply to wound bed as instructed Secondary Dressing ABD Pad, 8x10 Discharge Instruction: Apply over primary dressing as directed. Secured With Compression Wrap Urgo K2 Lite, (equivalent to a 3 layer) two layer compression system, regular Discharge Instruction: Apply Urgo K2 Lite as directed (alternative to 3 layer compression). Compression Stockings Add-Ons Electronic Signature(s) Signed: 05/20/2023 5:01:06 PM By: Samuella Bruin Entered By: Samuella Bruin on 05/20/2023 16:37:34 -------------------------------------------------------------------------------- Wound Assessment Details Patient Name: Date of Service: [name], [name] 05/20/2023 4:00 PM Medical Record Number: [id number] Patient Account Number:  [id number] Date of Birth/Sex: Treating RN: 1955-11-11 (67 y.o. Fredderick Phenix Primary Care Chidubem Chaires: Alfredo Martinez Other Clinician: Referring Rayla Pember: Treating Shenee Wignall/Extender: Dan Europe, [name] in Treatment: 2 Wound Status Wound Number: 9 Primary Venous Leg Ulcer Etiology: Wound Location: Right, Distal, Anterior Lower Leg Secondary Diabetic Wound/Ulcer of the Lower Extremity Wounding Event: Trauma Etiology: Date Acquired: 04/10/2023 Wound Open Weeks Of Treatment: 2 Status: Clustered Wound: No Comorbid Cataracts, Anemia, Sleep Apnea, Arrhythmia, Coronary Artery History: Disease, Hypertension, Peripheral Venous Disease, Type II Diabetes, Osteoarthritis, Neuropathy Wound Measurements Length: (cm) 2.5 Width: (cm) 2 Depth: (cm) 0.1 Area: (cm) 3.927 Volume: (cm) 0.393 % Reduction in Area: -20.8% % Reduction in Volume: -20.9% Epithelialization: Small (1-33%) Tunneling: No Undermining: No Wound Description Classification: Full Thickness Without Exposed Support Structures Wound Margin: Distinct, outline attached Exudate Amount: Medium Exudate Type: Sanguinous Exudate Color: red Foul Odor After Cleansing: No Slough/Fibrino Yes Wound Bed Granulation Amount: Small (1-33%) Exposed Structure Granulation Quality: Red Fascia Exposed: No Necrotic Amount: Large (  67-100%) Fat Layer (Subcutaneous Tissue) Exposed: Yes Necrotic Quality: Eschar Tendon Exposed: No Muscle Exposed: No Hasty, Jania L ([id number]) [id number] Page 9 of 10 Joint Exposed: No Bone Exposed: No Periwound Skin Texture Texture Color No Abnormalities Noted: Yes No Abnormalities Noted: Yes Moisture Temperature / Pain No Abnormalities Noted: No Temperature: No Abnormality Dry / Scaly: No Maceration: Yes Treatment Notes Wound #9 (Lower Leg) Wound Laterality: Right, Anterior, Distal Cleanser Soap and Water Discharge Instruction: May shower and wash  wound with dial antibacterial soap and water prior to dressing change. Wound Cleanser Discharge Instruction: Cleanse the wound with wound cleanser prior to applying a clean dressing using gauze sponges, not tissue or cotton balls. Peri-Wound Care Sween Lotion (Moisturizing lotion) Discharge Instruction: Apply moisturizing lotion as directed Topical Primary Dressing Maxorb Extra Ag+ Alginate Dressing, 2x2 (in/in) Discharge Instruction: Apply to wound bed as instructed Secondary Dressing ABD Pad, 8x10 Discharge Instruction: Apply over primary dressing as directed. Secured With Compression Wrap Urgo K2 Lite, (equivalent to a 3 layer) two layer compression system, regular Discharge Instruction: Apply Urgo K2 Lite as directed (alternative to 3 layer compression). Compression Stockings Add-Ons Electronic Signature(s) Signed: 05/20/2023 5:01:06 PM By: Samuella Bruin Entered By: Samuella Bruin on 05/20/2023 16:37:50 -------------------------------------------------------------------------------- Vitals Details Patient Name: Date of Service: [name]. 05/20/2023 4:00 PM Medical Record Number: [id number] Patient Account Number: [id number] Date of Birth/Sex: Treating RN: 1956-06-11 (67 y.o. Fredderick Phenix Primary Care Terran Hollenkamp: Alfredo Martinez Other Clinician: Referring Dianne Bady: Treating Demario Faniel/Extender: Dan Europe, [name] in Treatment: 2 Vital Signs Time Taken: 16:30 Temperature (F): 98.3 Height (in): 70 Pulse (bpm): 83 Weight (lbs): 166 Respiratory Rate (breaths/min): 18 Body Mass Index (BMI): 23.8 Blood Pressure (mmHg): 156/72 Reference Range: 80 - 120 mg / dl Bai, Soyla L ([id number]) [id number] Page 10 of 10 Electronic Signature(s) Signed: 05/20/2023 5:01:06 PM By: Samuella Bruin Entered By: Samuella Bruin on 05/20/2023 16:30:25

## 2023-05-21 NOTE — Progress Notes (Signed)
Remote pacemaker transmission.   

## 2023-05-23 ENCOUNTER — Encounter: Payer: Self-pay | Admitting: Oncology

## 2023-05-26 ENCOUNTER — Encounter: Payer: Self-pay | Admitting: Oncology

## 2023-05-27 ENCOUNTER — Encounter: Payer: Self-pay | Admitting: Student

## 2023-05-27 ENCOUNTER — Ambulatory Visit (INDEPENDENT_AMBULATORY_CARE_PROVIDER_SITE_OTHER): Payer: Medicare Other | Admitting: Student

## 2023-05-27 ENCOUNTER — Encounter (HOSPITAL_BASED_OUTPATIENT_CLINIC_OR_DEPARTMENT_OTHER): Payer: Medicare Other | Attending: Internal Medicine | Admitting: Internal Medicine

## 2023-05-27 VITALS — BP 129/65 | HR 65 | Ht 68.0 in | Wt 163.5 lb

## 2023-05-27 DIAGNOSIS — R600 Localized edema: Secondary | ICD-10-CM | POA: Diagnosis not present

## 2023-05-27 DIAGNOSIS — L97815 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 without evidence of necrosis: Secondary | ICD-10-CM | POA: Insufficient documentation

## 2023-05-27 DIAGNOSIS — L97812 Non-pressure chronic ulcer of other part of right lower leg with fat layer exposed: Secondary | ICD-10-CM | POA: Insufficient documentation

## 2023-05-27 DIAGNOSIS — E11622 Type 2 diabetes mellitus with other skin ulcer: Secondary | ICD-10-CM | POA: Diagnosis not present

## 2023-05-27 DIAGNOSIS — L03119 Cellulitis of unspecified part of limb: Secondary | ICD-10-CM | POA: Diagnosis not present

## 2023-05-27 DIAGNOSIS — I872 Venous insufficiency (chronic) (peripheral): Secondary | ICD-10-CM | POA: Diagnosis not present

## 2023-05-27 DIAGNOSIS — R413 Other amnesia: Secondary | ICD-10-CM | POA: Diagnosis present

## 2023-05-27 NOTE — Patient Instructions (Signed)
It was great to see you today! Thank you for choosing [facility] for your primary care.  Today we addressed: Follow up with wound care and let me know of any changes  Please come into your appt in 2 days with your daughter Call with worsening fever, chills, abdominal pain, vomiting   If you haven't already, sign up for My Chart to have easy access to your labs results, and communication with your primary care physician.   Please arrive 15 minutes before your appointment to ensure smooth check in process.  We appreciate your efforts in making this happen.  Thank you for allowing me to participate in your care, Alfredo Martinez, MD 05/27/2023, 10:29 AM PGY-3, [facility]

## 2023-05-27 NOTE — Assessment & Plan Note (Signed)
Continues to experience memory loss and changes in memory.  MRI brain showing mild volume loss without lobar predilection.  Has seen neurology in the past.  She has an appointment with geriatric clinic for further assessment and MoCA.  Instructed to bring her daughter with her to the visit.

## 2023-05-27 NOTE — Progress Notes (Signed)
    SUBJECTIVE:   CHIEF COMPLAINT / HPI:   Cellulitis Right Lower Extremity:  -Presented on 04/28/23 with lower leg swelling, erythema, pain, drainage initially -No systemic symptoms -Was given Doxy + Augmentin for treatment of cellulitis -Improved symptomatically on last visit  -Patient reports no fever or chills -She was told by wound care to not allow her PCP to change the wound dressing  Memory Concerns Patient continues to endorse concerns with her memory and word finding ability.  She has an appointment in 2 days with geriatric clinic  PERTINENT  PMH / PSH:  Coronary artery disease Hypertension - Zestoretic and spironolactone  Paroxysmal A-fib obesity with sleep apnea -- Eliquis  GERD short-bowel syndrome h/o bariatric surgery  Hyperparathyroidism Hypothyroidism - TSH 3 mo ago normal  Hyperlipidemia Parathyroid adenoma Peripheral neuropathy Age-related cognitive decline B12 deficiency - discontinued supplementation as she had elevated B12 on last check  Pacemaker? Major depression Vitamin D deficiency  OBJECTIVE:   BP 129/65   Ht 5\' 8"  (1.727 m)   Wt 163 lb 8 oz (74.2 kg)   SpO2 95%   BMI 24.86 kg/m   General: Alert and oriented in no apparent distress Heart: Regular rate and rhythm with no murmurs appreciated Lungs: CTA bilaterally, no wheezing Skin: Warm and dry Extremities: Dressing present over right lower extremity up to knee, no obvious evidence of active bleeding through dressing without strikethrough   ASSESSMENT/PLAN:   Assessment & Plan Memory loss Continues to experience memory loss and changes in memory.  MRI brain showing mild volume loss without lobar predilection.  Has seen neurology in the past.  She has an appointment with geriatric clinic for further assessment and MoCA.  Instructed to bring her daughter with her to the visit. Cellulitis of lower extremity, unspecified laterality Seems to be improved after completing antibiotics.  Will  defer to wound care for assessment and dressing changes.  She has an appointment with them today.  Discussed return precautions     Alfredo Martinez, MD [facility]

## 2023-05-28 NOTE — Progress Notes (Addendum)
PATRENA, MEHARG ([id number]) [id number] Page 1 of 9 Visit Report for 05/27/2023 Arrival Information Details Patient Name: Date of Service: [name], [name] 05/27/2023 11:00 A M Medical Record Number: [id number] Patient Account Number: [id number] Date of Birth/Sex: Treating RN: [birth year] (67 y.o. [name], [name] Primary Care Lenka Zhao: Alfredo Martinez Other Clinician: Referring Adalaya Irion: Treating Tinya Cadogan/Extender: [name], [name] in Treatment: 3 Visit Information History Since Last Visit Added or deleted any medications: No Patient Arrived: Ambulatory Any new allergies or adverse reactions: No Arrival Time: 11:30 Had a fall or experienced change in No Accompanied By: self activities of daily living that may affect Transfer Assistance: None risk of falls: Patient Identification Verified: Yes Signs or symptoms of abuse/neglect since last visito No Secondary Verification Process Completed: Yes Hospitalized since last visit: No Patient Has Alerts: Yes Implantable device outside of the clinic excluding No Patient Alerts: Patient on Blood Thinner cellular tissue based products placed in the center since last visit: Has Dressing in Place as Prescribed: Yes Has Compression in Place as Prescribed: Yes Pain Present Now: No Electronic Signature(s) Signed: 05/28/2023 8:10:42 AM By: Fonnie Mu RN Entered By: Fonnie Mu on 05/28/2023 08:10:42 -------------------------------------------------------------------------------- Encounter Discharge Information Details Patient Name: Date of Service: [name]. 05/27/2023 11:00 A M Medical Record Number: [id number] Patient Account Number: [id number] Date of Birth/Sex: Treating RN: [birth year] (67 y.o. [name], [name] Primary Care Geetika Laborde: Alfredo Martinez Other Clinician: Referring Cherisa Brucker: Treating Johneric Mcfadden/Extender: [name], [name] in Treatment:  3 Encounter Discharge Information Items Post Procedure Vitals Discharge Condition: Stable Temperature (F): 98.7 Ambulatory Status: Ambulatory Pulse (bpm): 74 Discharge Destination: Home Respiratory Rate (breaths/min): 17 Transportation: Private Auto Blood Pressure (mmHg): 120/80 Accompanied By: self Schedule Follow-up Appointment: Yes Clinical Summary of Care: Patient Declined Electronic Signature(s) Signed: 05/28/2023 8:17:08 AM By: Fonnie Mu RN Entered By: Fonnie Mu on 05/28/2023 08:17:08 [name] ([id number]) [id number] Page 2 of 9 -------------------------------------------------------------------------------- Lower Extremity Assessment Details Patient Name: Date of Service: [name], [name] 05/27/2023 11:00 A M Medical Record Number: [id number] Patient Account Number: [id number] Date of Birth/Sex: Treating RN: August 15, 1955 (67 y.o. [name], [name] Primary Care Onnie Hatchel: Alfredo Martinez Other Clinician: Referring Renso Swett: Treating Mariem Skolnick/Extender: [name], [name] in Treatment: 3 Edema Assessment Assessed: [Left: No] [Right: Yes] Edema: [Left: Ye] [Right: s] Calf Left: Right: Point of Measurement: 36 cm From Medial Instep 34.5 cm Ankle Left: Right: Point of Measurement: 9 cm From Medial Instep 24 cm Vascular Assessment Pulses: Dorsalis Pedis Palpable: [Right:Yes] Posterior Tibial Palpable: [Right:Yes] Extremity colors, hair growth, and conditions: Extremity Color: [Right:Red] Hair Growth on Extremity: [Right:No] Temperature of Extremity: [Right:Warm] Capillary Refill: [Right:< 3 seconds] Dependent Rubor: [Right:No No] Electronic Signature(s) Signed: 05/28/2023 8:11:26 AM By: Fonnie Mu RN Entered By: Fonnie Mu on 05/28/2023 08:11:26 -------------------------------------------------------------------------------- Multi Wound Chart Details Patient Name: Date of Service: [name]. 05/27/2023 11:00 A M Medical Record Number: [id number] Patient Account Number: [id number] Date of Birth/Sex: Treating RN: [birth year] (67 y.o. F) Primary Care Ottilia Pippenger: Alfredo Martinez Other Clinician: Referring Cynia Abruzzo: Treating Joseline Mccampbell/Extender: [name], [name] in Treatment: 3 Vital Signs Height(in): 70 Pulse(bpm): 74 Weight(lbs): 166 Blood Pressure(mmHg): 120/70 Body Mass Index(BMI): 23.8 Temperature(F): 98.7 Respiratory Rate(breaths/min): 17 [name], [name] ([id number]) [8:Photos: No Photos Right, Proximal, Anterior Lower Leg Wound Location: Gradually Appeared Wounding Event: Venous Leg Ulcer Primary Etiology: N/A Secondary Etiology: Cataracts, Anemia, Sleep Apnea, Comorbid History: Arrhythmia, Coronary Artery Disease, Arrhythmia,  Coronary Artery Disease, Hypertension, Peripheral Venous Disease, Type II Diabetes,  Osteoarthritis, Neuropathy 03/27/2023 Date Acquired: 3 Weeks of Treatment: Open Wound Status: No Wound Recurrence: 0.5x0.7x0.1 Measurements [name] x W x D (cm) 0.275 A (cm) :  rea 0.027 Volume (cm) : 65.00% % Reduction in A rea: 65.80% % Reduction in Volume: Full Thickness Without Exposed Classification: Support Structures Medium Exudate A mount: Serosanguineous Exudate Type: red, brown Exudate Color: Distinct, outline  attached Wound Margin: Small (1-33%) Granulation A mount: Pink Granulation Quality: Large (67-100%) Necrotic A mount: Adherent Slough Necrotic Tissue: Fat Layer (Subcutaneous Tissue): Yes Fat Layer (Subcutaneous Tissue): Yes N/A Exposed Structures:  Fascia: No Tendon: No Muscle: No Joint: No Bone: No Small (1-33%) Epithelialization: N/A Debridement: Pre-procedure Verification/Time Out N/A Taken: N/A Pain Control: N/A Tissue Debrided: N/A Level: N/A Debridement A (sq cm): rea N/A Instrument: N/A  Bleeding: N/A Hemostasis A chieved: N/A Procedural Pain: N/A Post Procedural Pain: N/A Debridement Treatment Response: N/A Post Debridement Measurements  [name] x W x D (cm) N/A Post Debridement Volume: (cm) Excoriation: No Periwound Skin Texture: Induration:  No Callus: No Crepitus: No Rash: No Scarring: No Maceration: No Periwound Skin Moisture: Dry/Scaly: No Atrophie Blanche: No Periwound Skin Color: Cyanosis: No Ecchymosis: No Erythema: No Hemosiderin Staining: No Mottled: No Pallor: No Rubor: No No  Abnormality Temperature: N/A Procedures Performed:] [9:No Photos Right, Distal, Anterior Lower Leg Trauma Venous Leg Ulcer Diabetic Wound/Ulcer of the Lower Extremity Cataracts, Anemia, Sleep Apnea, Hypertension, Peripheral Venous Disease, Type II  Diabetes, Osteoarthritis, Neuropathy 04/10/2023 3 Open No 2.5x2x0.1 3.927 0.393 -20.80% -20.90% Full Thickness Without Exposed Support Structures Medium Sanguinous red Distinct, outline attached Small (1-33%) Red Large (67-100%) Eschar Fascia: No Tendon:  No Muscle: No Joint: No Bone: No Small (1-33%) Debridement - Selective/Open Wound N/A 11:30 Lidocaine Necrotic/Eschar, Slough Skin/Epidermis 3.92 Curette Minimum Pressure 0 0 Procedure was tolerated well 2.5x2x0.1 0.393 Excoriation: No Induration: No  Callus: No Crepitus: No Rash: No Scarring: No Maceration: Yes Dry/Scaly: No Erythema: Yes Atrophie Blanche: No Cyanosis: No Ecchymosis: No Hemosiderin Staining: No Mottled: No Pallor: No Rubor: No No Abnormality Debridement]  [[id number] Page 3 of 9 N/A N/A N/A N/A N/A N/A N/A N/A N/A N/A N/A N/A N/A N/A N/A N/A N/A N/A N/A N/A N/A N/A N/A N/A N/A N/A N/A N/A N/A N/A N/A N/A N/A N/A N/A N/A N/A N/A N/A N/A N/A N/A N/A] Treatment Notes Wound #8 (Lower Leg) Wound Laterality: Right, Anterior, Proximal Cleanser Soap and Water Discharge Instruction: May shower and wash wound with dial antibacterial soap and water prior to dressing change. Wound Cleanser Discharge Instruction: Cleanse the wound with wound cleanser prior to applying a clean dressing using gauze sponges, not tissue or cotton  balls. Peri-Wound Care Sween Lotion (Moisturizing lotion) Laser, Tashaya [name] ([id number]) [id number] Page 4 of 9 Discharge Instruction: Apply moisturizing lotion as directed Topical Primary Dressing Maxorb Extra Ag+ Alginate Dressing, 2x2 (in/in) Discharge Instruction: Apply to wound bed as instructed Secondary Dressing ABD Pad, 8x10 Discharge Instruction: Apply over primary dressing as directed. Secured With Compression Wrap Kerlix Roll 4.5x3.1 (in/yd) Discharge Instruction: Apply Kerlix and Coban compression as directed. Coban Self-Adherent Wrap 4x5 (in/yd) Discharge Instruction: Apply over Kerlix as directed. Compression Stockings Add-Ons Wound #9 (Lower Leg) Wound Laterality: Right, Anterior, Distal Cleanser Soap and Water Discharge Instruction: May shower and wash wound with dial antibacterial soap and water prior to dressing change. Wound Cleanser Discharge Instruction: Cleanse the wound with wound cleanser prior to applying a clean dressing using gauze sponges, not tissue or cotton balls. Peri-Wound Care Sween Lotion (  Moisturizing lotion) Discharge Instruction: Apply moisturizing lotion as directed Topical Primary Dressing Maxorb Extra Ag+ Alginate Dressing, 2x2 (in/in) Discharge Instruction: Apply to wound bed as instructed Secondary Dressing ABD Pad, 8x10 Discharge Instruction: Apply over primary dressing as directed. Secured With Compression Wrap Kerlix Roll 4.5x3.1 (in/yd) Discharge Instruction: Apply Kerlix and Coban compression as directed. Coban Self-Adherent Wrap 4x5 (in/yd) Discharge Instruction: Apply over Kerlix as directed. Compression Stockings Add-Ons Electronic Signature(s) Signed: 06/01/2023 9:47:40 AM By: Baltazar Najjar MD Entered By: Baltazar Najjar on 05/29/2023 07:45:51 -------------------------------------------------------------------------------- Multi-Disciplinary Care Plan Details Patient Name: Date of  Service: [name], [name] 05/27/2023 11:00 A Marlana Latus, Leotis Pain ([id number]) [phone].pdf Page 5 of 9 Medical Record Number: [id number] Patient Account Number: [id number] Date of Birth/Sex: Treating RN: Nov 14, 1955 (67 y.o. [name], [name] Primary Care Derhonda Eastlick: Alfredo Martinez Other Clinician: Referring Yoan Sallade: Treating Rosezella Kronick/Extender: [name], [name] in Treatment: 3 Active Inactive Orientation to the Wound Care Program Nursing Diagnoses: Knowledge deficit related to the wound healing center program Goals: Patient/caregiver will verbalize understanding of the Wound Healing Center Program Date Initiated: 05/12/2023 Target Resolution Date: 05/30/2023 Goal Status: Active Interventions: Provide education on orientation to the wound center Notes: Wound/Skin Impairment Nursing Diagnoses: Impaired tissue integrity Knowledge deficit related to ulceration/compromised skin integrity Goals: Patient/caregiver will verbalize understanding of skin care regimen Date Initiated: 05/12/2023 Target Resolution Date: 06/27/2023 Goal Status: Active Ulcer/skin breakdown will heal within 14 weeks Date Initiated: 05/12/2023 Target Resolution Date: 06/06/2023 Goal Status: Active Interventions: Assess ulceration(s) every visit Treatment Activities: Skin care regimen initiated : 05/12/2023 Topical wound management initiated : 05/12/2023 Notes: Electronic Signature(s) Signed: 05/28/2023 8:16:11 AM By: Fonnie Mu RN Entered By: Fonnie Mu on 05/28/2023 08:16:10 -------------------------------------------------------------------------------- Pain Assessment Details Patient Name: Date of Service: [name] 05/27/2023 11:00 A M Medical Record Number: [id number] Patient Account Number: [id number] Date of Birth/Sex: Treating RN: 09-20-1955 (67 y.o. [name], [name] Primary Care Agastya Meister: Alfredo Martinez Other Clinician: Referring  Ashlynd Michna: Treating [place] Inthavong/Extender: [name], [name] in Treatment: 3 Active Problems Location of Pain Severity and Description of Pain Patient Has Paino No Site Locations [name], [name] ([id number]) [phone].pdf Page 6 of 9 Pain Management and Medication Current Pain Management: Electronic Signature(s) Signed: 05/28/2023 8:11:18 AM By: Fonnie Mu RN Entered By: Fonnie Mu on 05/28/2023 08:11:18 -------------------------------------------------------------------------------- Patient/Caregiver Education Details Patient Name: Date of Service: [name], [name]. 12/3/2024andnbsp11:00 A M Medical Record Number: [id number] Patient Account Number: [id number] Date of Birth/Gender: Treating RN: [birth year] (67 y.o. [name], [name] Primary Care Physician: Alfredo Martinez Other Clinician: Referring Physician: Treating Physician/Extender: [name], [name] in Treatment: 3 Education Assessment Education Provided To: Patient Education Topics Provided Wound/Skin Impairment: Methods: Explain/Verbal Responses: Reinforcements needed, State content correctly Electronic Signature(s) Signed: 05/28/2023 4:12:43 PM By: Fonnie Mu RN Entered By: Fonnie Mu on 05/28/2023 08:16:20 -------------------------------------------------------------------------------- Wound Assessment Details Patient Name: Date of Service: [name]. 05/27/2023 11:00 A M Medical Record Number: [id number] Patient Account Number: [id number] Date of Birth/Sex: Treating RN: 06/12/1956 (67 y.o. [name], [name] Primary Care Celisse Ciulla: Alfredo Martinez Other Clinician: Referring Nyko Gell: Treating Nell Schrack/Extender: Allaura, [place], Leotis Pain ([id number]) [id number] Page 7 of 9 Weeks in Treatment: 3 Wound Status Wound Number: 8 Primary Venous Leg Ulcer Etiology: Wound Location: Right,  Proximal, Anterior Lower Leg Wound Open Wounding Event: Gradually Appeared Status: Date Acquired: 03/27/2023 Comorbid Cataracts, Anemia, Sleep Apnea, Arrhythmia, Coronary Artery Weeks Of Treatment: 3 History: Disease, Hypertension, Peripheral Venous Disease, Type II Clustered Wound: No Diabetes, Osteoarthritis,  Neuropathy Wound Measurements Length: (cm) 0.5 Width: (cm) 0.7 Depth: (cm) 0.1 Area: (cm) 0.275 Volume: (cm) 0.027 % Reduction in Area: 65% % Reduction in Volume: 65.8% Epithelialization: Small (1-33%) Tunneling: No Undermining: No Wound Description Classification: Full Thickness Without Exposed Support Structures Wound Margin: Distinct, outline attached Exudate Amount: Medium Exudate Type: Serosanguineous Exudate Color: red, brown Foul Odor After Cleansing: No Slough/Fibrino Yes Wound Bed Granulation Amount: Small (1-33%) Exposed Structure Granulation Quality: Pink Fascia Exposed: No Necrotic Amount: Large (67-100%) Fat Layer (Subcutaneous Tissue) Exposed: Yes Necrotic Quality: Adherent Slough Tendon Exposed: No Muscle Exposed: No Joint Exposed: No Bone Exposed: No Periwound Skin Texture Texture Color No Abnormalities Noted: Yes No Abnormalities Noted: Yes Moisture Temperature / Pain No Abnormalities Noted: Yes Temperature: No Abnormality Treatment Notes Wound #8 (Lower Leg) Wound Laterality: Right, Anterior, Proximal Cleanser Soap and Water Discharge Instruction: May shower and wash wound with dial antibacterial soap and water prior to dressing change. Wound Cleanser Discharge Instruction: Cleanse the wound with wound cleanser prior to applying a clean dressing using gauze sponges, not tissue or cotton balls. Peri-Wound Care Sween Lotion (Moisturizing lotion) Discharge Instruction: Apply moisturizing lotion as directed Topical Primary Dressing Maxorb Extra Ag+ Alginate Dressing, 2x2 (in/in) Discharge Instruction: Apply to wound bed as  instructed Secondary Dressing ABD Pad, 8x10 Discharge Instruction: Apply over primary dressing as directed. Secured With Compression Wrap Kerlix Roll 4.5x3.1 (in/yd) Discharge Instruction: Apply Kerlix and Coban compression as directed. Coban Self-Adherent Wrap 4x5 (in/yd) Discharge Instruction: Apply over Kerlix as directed. Compression Stockings ESMERALDA, VONNAHME ([id number]) [id number] Page 8 of 9 Add-Ons Electronic Signature(s) Signed: 05/28/2023 8:11:49 AM By: Fonnie Mu RN Entered By: Fonnie Mu on 05/28/2023 08:11:49 -------------------------------------------------------------------------------- Wound Assessment Details Patient Name: Date of Service: ZAHRIYAH, DERAAD 05/27/2023 11:00 A M Medical Record Number: [id number] Patient Account Number: [id number] Date of Birth/Sex: Treating RN: September 04, 1955 (67 y.o. [name], [name] Primary Care Consuela Widener: Alfredo Martinez Other Clinician: Referring [name]: Treating Rosiland Sen/Extender: [name], [name] in Treatment: 3 Wound Status Wound Number: 9 Primary Venous Leg Ulcer Etiology: Wound Location: Right, Distal, Anterior Lower Leg Secondary Diabetic Wound/Ulcer of the Lower Extremity Wounding Event: Trauma Etiology: Date Acquired: 04/10/2023 Wound Open Weeks Of Treatment: 3 Status: Clustered Wound: No Comorbid Cataracts, Anemia, Sleep Apnea, Arrhythmia, Coronary Artery History: Disease, Hypertension, Peripheral Venous Disease, Type II Diabetes, Osteoarthritis, Neuropathy Wound Measurements Length: (cm) 2.5 Width: (cm) 2 Depth: (cm) 0.1 Area: (cm) 3.927 Volume: (cm) 0.393 % Reduction in Area: -20.8% % Reduction in Volume: -20.9% Epithelialization: Small (1-33%) Tunneling: No Undermining: No Wound Description Classification: Full Thickness Without Exposed Support Structures Wound Margin: Distinct, outline attached Exudate Amount: Medium Exudate Type:  Sanguinous Exudate Color: red Foul Odor After Cleansing: No Slough/Fibrino Yes Wound Bed Granulation Amount: Small (1-33%) Exposed Structure Granulation Quality: Red Fascia Exposed: No Necrotic Amount: Large (67-100%) Fat Layer (Subcutaneous Tissue) Exposed: Yes Necrotic Quality: Eschar Tendon Exposed: No Muscle Exposed: No Joint Exposed: No Bone Exposed: No Periwound Skin Texture Texture Color No Abnormalities Noted: Yes No Abnormalities Noted: Yes Moisture Temperature / Pain No Abnormalities Noted: No Temperature: No Abnormality Dry / Scaly: No Maceration: Yes Treatment Notes Wound #9 (Lower Leg) Wound Laterality: Right, Anterior, Distal Cleanser Soap and Water Discharge Instruction: May shower and wash wound with dial antibacterial soap and water prior to dressing change. Wound Cleanser Discharge Instruction: Cleanse the wound with wound cleanser prior to applying a clean dressing using gauze sponges, not tissue or cotton balls. MYNA, VAUGHN ([id number]) [id number] Page 9  of 9 Peri-Wound Care Sween Lotion (Moisturizing lotion) Discharge Instruction: Apply moisturizing lotion as directed Topical Primary Dressing Maxorb Extra Ag+ Alginate Dressing, 2x2 (in/in) Discharge Instruction: Apply to wound bed as instructed Secondary Dressing ABD Pad, 8x10 Discharge Instruction: Apply over primary dressing as directed. Secured With Compression Wrap Kerlix Roll 4.5x3.1 (in/yd) Discharge Instruction: Apply Kerlix and Coban compression as directed. Coban Self-Adherent Wrap 4x5 (in/yd) Discharge Instruction: Apply over Kerlix as directed. Compression Stockings Add-Ons Electronic Signature(s) Signed: 05/28/2023 8:12:05 AM By: Fonnie Mu RN Entered By: Fonnie Mu on 05/28/2023 08:12:05 -------------------------------------------------------------------------------- Vitals Details Patient Name: Date of Service: [name].  05/27/2023 11:00 A M Medical Record Number: [id number] Patient Account Number: [id number] Date of Birth/Sex: Treating RN: 10-19-1955 (67 y.o. [name], [name] Primary Care Chidubem Chaires: Alfredo Martinez Other Clinician: Referring Baily Serpe: Treating Keanen Dohse/Extender: [name], [name] in Treatment: 3 Vital Signs Time Taken: 11:30 Temperature (F): 98.7 Height (in): 70 Pulse (bpm): 74 Weight (lbs): 166 Respiratory Rate (breaths/min): 17 Body Mass Index (BMI): 23.8 Blood Pressure (mmHg): 120/70 Reference Range: 80 - 120 mg / dl Electronic Signature(s) Signed: 05/28/2023 8:11:14 AM By: Fonnie Mu RN Entered By: Fonnie Mu on 05/28/2023 08:11:14

## 2023-05-29 ENCOUNTER — Ambulatory Visit (INDEPENDENT_AMBULATORY_CARE_PROVIDER_SITE_OTHER): Payer: Medicare Other | Admitting: Family Medicine

## 2023-05-29 VITALS — BP 110/57 | HR 78 | Ht 68.0 in | Wt 163.4 lb

## 2023-05-29 DIAGNOSIS — H9113 Presbycusis, bilateral: Secondary | ICD-10-CM

## 2023-05-29 DIAGNOSIS — R2689 Other abnormalities of gait and mobility: Secondary | ICD-10-CM | POA: Diagnosis not present

## 2023-05-29 DIAGNOSIS — E669 Obesity, unspecified: Secondary | ICD-10-CM

## 2023-05-29 DIAGNOSIS — G4730 Sleep apnea, unspecified: Secondary | ICD-10-CM | POA: Diagnosis not present

## 2023-05-29 DIAGNOSIS — R4189 Other symptoms and signs involving cognitive functions and awareness: Secondary | ICD-10-CM

## 2023-05-29 DIAGNOSIS — E639 Nutritional deficiency, unspecified: Secondary | ICD-10-CM | POA: Diagnosis not present

## 2023-05-29 DIAGNOSIS — Z636 Dependent relative needing care at home: Secondary | ICD-10-CM

## 2023-05-29 DIAGNOSIS — Z9884 Bariatric surgery status: Secondary | ICD-10-CM

## 2023-05-29 DIAGNOSIS — R296 Repeated falls: Secondary | ICD-10-CM | POA: Diagnosis not present

## 2023-05-29 DIAGNOSIS — R269 Unspecified abnormalities of gait and mobility: Secondary | ICD-10-CM

## 2023-05-29 NOTE — Progress Notes (Signed)
Provider:  Acquanetta Belling, MD Location:   [facility]   Place of Service:     PCP: Alfredo Martinez, MD Patient Care Team: Alfredo Martinez, MD as PCP - General (Family Medicine) Lanier Prude, MD as PCP - Electrophysiology (Cardiology) Linna Darner, RD as Dietitian (Family Medicine) Fredrich Birks, OD as Referring Physician (Optometry) Alva Garnet Tyrone Apple, MD as Attending Physician (Bariatrics) August Saucer, Corrie Mckusick, MD as Consulting Physician (Orthopedic Surgery) Jeralyn Ruths, MD as Consulting Physician (Oncology)  Extended Emergency Contact Information Primary Emergency Contact: Roylene Reason [place] of [place] Home Phone: [phone] Mobile Phone: [phone] Relation: Daughter Secondary Emergency Contact: Sayas,Greg Address: [address]          [place], [place] [address] [place] of [place] Home Phone: [phone] Relation: Spouse  Code Status: DNR Goals of Care: Advanced Directive information    05/29/2023    1:42 PM  Advanced Directives  Does Patient Have a Medical Advance Directive? No  Would patient like information on creating a medical advance directive? No - Patient declined     [facility]:   Patient is accompanied by  daughter , Elayah Enwright Primary caregiver:  patient and Charleta Neiheisel, daughter  Patient's Currently living arrangement:  lives with husband.  He has MS, is WC bound, and requires significant assistance with ADLs by the patient Patient information was obtained from imaging reports: Brain MRI (04/18/23) report, lab reports, and Consult note Dr Jeannie Fend. [place] (Neuro) 02/10/23   . History/Exam limitations:  Language difficulty recalling nouns and names. Primary Care Provider:   Alfredo Martinez, MD Referring provider:  Alfredo Martinez, MD Reason for referral:  cognitive impairment concerns.    ----------------------------------------------------------------------------------------------------------------------------------------------------------------------------------------------------------------------------------------------------------------   HPI by problems:  Chief Complaint  Patient presents with   Memory Loss    Cognitive impairment concern  Are there problems with thinking?  Cognition domains: Memory difficulties, Language/Speech difficulties, and Attention difficulties   When were the changes first noticed?  years  Did this change occur abruptly or gradually?  gradual  How have the changes progressed since then?  gradually worsening  Has there been any tremors or abnormal movements?  When using hands  Have they had in hallucinations or delusions:  no  Have they appeared more anxious or sad lately?  no  Do they still have interests or activities they enjor doing?  no  How has their appetite been lately?  show no change   Problem remembering things about family and friends e.g. names,  occupations, birthdays, addresses?  yes  Problem remembering conversations or news events a few days later?  yes  Problem remembering what day and month it is? Sometimes  Problem with losing things?  no  Problem learning to use a new gadget or machine around the house, e.g., cell phones, computer, microwave, remote control?  no  Problem with handling money for shopping?  no  Problem handling financial matters, e.g. their pension, checking, credit cards, dealing with the bank?  Husband has always handles household finances  Problem with getting lost in familiar places?  no  PHQ-9: Flowsheet Row Office Visit from 05/27/2023 in [facility] [place]. [facility]  PHQ-9 Total Score 1        Outpatient Encounter Medications as of 05/29/2023  Medication Sig   Accu-Chek Softclix Lancets lancets Use as instructed  once daily   apixaban (ELIQUIS) 5 MG TABS tablet Take 1 tablet (5 mg  total) by mouth 2 (two) times daily.   atorvastatin (LIPITOR) 40 MG tablet TAKE 1 TABLET BY MOUTH EVERY DAY   blood glucose meter kit and supplies KIT Dispense based on patient and insurance preference. Use up to four times daily as directed.   buPROPion (WELLBUTRIN XL) 150 MG 24 hr tablet TAKE 1 TABLET BY MOUTH EVERY DAY   Cyanocobalamin (VITAMIN B-12) 3000 MCG SUBL Place 3,000 mcg under the tongue daily.   gabapentin (NEURONTIN) 300 MG capsule Take 1 capsule (300 mg total) by mouth at bedtime.   glucose blood (ONETOUCH VERIO) test strip 1 each by Other route as needed for other. Use as instructed   lisinopril-hydrochlorothiazide (ZESTORETIC) 20-25 MG tablet Take 1 tablet by mouth daily.   Multiple Vitamins-Minerals (MULTIVITAMIN ADULT) CHEW Once daily   Semaglutide, 2 MG/DOSE, 8 MG/3ML SOPN Inject 2 mg into the skin once a week. Once weekly   sertraline (ZOLOFT) 100 MG tablet Take 1 tablet (100 mg total) by mouth daily.   spironolactone (ALDACTONE) 25 MG tablet TAKE 1 TABLET BY MOUTH EVERY DAY   Teduglutide, rDNA, 5 MG KIT Inject 5 mg into the skin daily. Gattex   tobramycin (TOBREX) 0.3 % ophthalmic solution See admin instructions. Every eight weeks    History Patient Active Problem List   Diagnosis Date Noted   Type 2 DM with diabetic neuropathy affecting both sides of body (HCC) 01/09/2021    Priority: High   Hyperparathyroidism (HCC) 04/11/2020    Priority: High   Hypertension associated with diabetes (HCC) 07/11/2018    Priority: High   Hyperlipidemia associated with type 2 diabetes mellitus (HCC) 07/11/2018    Priority: High   Major depression in remission (HCC) 07/07/2018    Priority: High   Paroxysmal atrial fibrillation (HCC) 05/11/2018    Priority: High   Type 2 diabetes, controlled, with neuropathy (HCC) 02/18/2008    Priority: High   Memory loss 08/07/2020    Priority: Medium    Hypothyroidism  07/19/2019    Priority: Medium    Cognitive impairment 04/30/2018    Priority: Medium    Gastroesophageal reflux disease without esophagitis 04/15/2017    Priority: Medium    Vitamin B12 deficiency 12/13/2016    Priority: Medium    PSORIASIS 05/28/2010    Priority: Medium    Peripheral neuropathy 01/12/2009    Priority: Medium    Obesity with sleep apnea 06/12/2007    Priority: Medium    Abnormality of gait due to impairment of balance 02/10/2023    Priority: Low   Lumbar stenosis with neurogenic claudication 11/05/2022    Priority: Low   Presbycusis of both ears 04/11/2020    Priority: Low   Pacemaker     Priority: Low   Acquired short bowel syndrome 07/11/2018    Priority: Low   Coronary artery disease involving native coronary artery of native heart without angina pectoris 07/07/2018    Priority: Low   Vitamin D deficiency 03/09/2016    Priority: Low   S/P bariatric surgery-duodenal switch with sleeve gastrectomy 12/04/2013    Priority: Low   Iron deficiency anemia 06/19/2012    Priority: Low   Low back pain 03/05/2012    Priority: Low   Caregiver stress 05/30/2023   Word finding difficulty 02/10/2023   Parathyroid adenoma 11/23/2020   Past Medical History:  Diagnosis Date   Advanced directives, counseling/discussion 04/12/2020   Allergy    Anemia    ANEMIA, PERNICIOUS, HX OF 05/13/2007   Anxiety  Arthritis    ASYMPTOMATIC POSTMENOPAUSAL STATUS 02/18/2008   B12 deficiency 12/13/2016   BACK PAIN, LUMBAR 11/19/2007   Blood in urine 07/19/2019   Bowel incontinence    Cataract    Cellulitis of left lower leg 10/28/2013   Cellulitis of leg, right 10/11/2010   Chronic depression 10/06/2007   Chronic dermatitis of hands 05/02/2009   Coronary artery disease involving native coronary artery of native heart without angina pectoris 07/07/2018   Last Assessment & Plan:  Formatting of this note might be different from the original. The patient reports remote cardiac  catheterization with up to 40% plaque.  Subsequent Cardiolite stress test in October 2019 did not show any evidence of active ischemia or prior infarction. Formatting of this note might be different from the original. medical  Last Assessment & Plan:  Formatting of this note mi   DEPRESSION, CHRONIC 10/06/2007   Diabetes mellitus without complication (HCC)    DIABETES MELLITUS, WITH NEUROLOGICAL COMPLICATIONS 02/18/2008   Diabetic neuropathy (HCC)    DYSLIPIDEMIA 05/13/2007   Dyslipidemia 04/15/2017   Last Assessment & Plan:  The patient will continue atorvastatin for her dyslipidemia.   Dyslipidemia associated with type 2 diabetes mellitus (HCC) 07/11/2018   Essential hypertension 03/20/2010   Family history of colon cancer    Fecal incontinence 08/02/2015   Frozen shoulder    Lt   Full dentures    Gastroesophageal reflux disease without esophagitis 04/15/2017   Generalized abdominal pain 02/05/2018   Genetic testing 11/24/2017   Negative genetic testing on the common hereditary cancer panel.  The Hereditary Gene Panel offered by [facility] includes sequencing and/or deletion duplication testing of the following 47 genes: APC, ATM, AXIN2, BARD1, BMPR1A, BRCA1, BRCA2, BRIP1, CDH1, CDK4, CDKN2A (p14ARF), CDKN2A (p16INK4a), CHEK2, CTNNA1, DICER1, EPCAM (Deletion/duplication testing only), GREM1 (promoter region deletion/duplicat   GERD (gastroesophageal reflux disease)    GI bleed 03/15/2016   GOITER, MULTINODULAR 05/13/2007   GOITER, MULTINODULAR 05/13/2007    (last TSH 3.26; US soft tissue neck in 09/2004 showed multinodular goiter with specific small nodule for which was recommended to be followed by repeat US in 3-6 months 12/2016: Thyroid US: recommend repeat in 1 year (one nodule 1.7cm on left thyroid)    Hallux rigidus of left foot 04/12/2020   Headache    History of blood transfusion    Hx of colonic polyps 12/04/2010   HYPERCHOLESTEROLEMIA 03/20/2010   Hyperparathyroidism (HCC)  04/11/2020   HYPERTENSION 03/20/2010   Hypertension associated with diabetes (HCC) 07/11/2018   Iron deficiency anemia 06/19/2012   Low back pain 03/05/2012   Completed PT-notes say very motivated and had good progress.   CT (03/2020: Mild curvature convex to the right in the upper lumbar region into left in lower lumbar region. No antero or retrolisthesis in the supine position. retrolisthesis at L3-4 of 3 mm with flexion which increases to 6 mm with neutral and extension. Multifactorial spinal stenosis at L3-4 due to circumferential protrusion of the    Lower back pain    Major depression in remission (HCC) 07/07/2018   Last Assessment & Plan:  Mood has seemed good on sertraline.   Malabsorption 03/12/2016   NASH (nonalcoholic steatohepatitis)    Nausea 07/19/2019   Need for immunization against influenza 04/12/2020   No-show for appointment 03/08/2020   Nonobstructive CAD  08/26/2013   The patient has prior cardiac workup in 2006 when she underwent a stress test for abnormal EKG. The stress test was nonconclusive and she underwent cardiac  catheterization that showed 30% stenosis in the LAD in 25% stenosis in RCA she was treated medically.     OA (osteoarthritis)    OBSTRUCTIVE SLEEP APNEA 06/12/2007   uses CPAP.   Pacemaker    Paroxysmal atrial fibrillation (HCC) 05/11/2018   Last Assessment & Plan:  Patient was noted to have episodes of paroxysmal atrial fibrillation that was predominantly rate controlled and at a relatively low burden.  She is chronically on anticoagulation now with Eliquis.  She does not take any negative chronotropic agents with her sinus bradycardia.  If she has any breakthrough more prolonged tachycardic episodes and requires negative chronotropi   Peripheral autonomic neuropathy due to diabetes mellitus (HCC) 07/11/2018   PERIPHERAL NEUROPATHY 01/12/2009   Peripheral neuropathy 01/12/2009   Peripheral vascular disease (HCC)    Presbycusis of both ears 04/11/2020    Primary osteoarthritis involving multiple joints 07/07/2018   Last Assessment & Plan:  Post op left tka and here for rehab as her husband is handicapped so she has limited help at home. Consitpation noted, some pain overnight, better with a pillow under her knee.   Primary osteoarthritis of left knee 04/23/2011   Previously seen by Dr. Dorene Grebe. Will plan on repeat referral after trial injection.      PSORIASIS 05/28/2010   Rectal prolapse 05/24/2015   Rectovaginal fistula 05/25/2014   Renal insufficiency    stage 1 kd   S/P bariatric surgery-duodenal switch with sleeve gastrectomy 12/04/2013   S/P total knee arthroplasty, right 11/16/2019   Screening mammogram, encounter for 07/12/2019   Second degree AV block 02/08/2020   Serum calcium elevated 04/12/2020   Short bowel syndrome 07/11/2018   Spigelian hernia 02/05/2018   Status post left knee replacement 07/11/2018   Stress 05/24/2015   Surgical counseling visit 10/02/2019   Tubular adenoma of colon 03/09/2016   Colonoscopy in 10/2015: 3  tubular adenomas; GI recommends repeat colonoscopy in 3 years Colonoscopy August 2012: 3 tubular adenomas.    Type 2 diabetes, controlled, with neuropathy (HCC) 02/18/2008   Umbilical hernia without obstruction and without gangrene 04/15/2017   Unilateral primary osteoarthritis, left knee    UNSPECIFIED VENOUS INSUFFICIENCY 05/28/2010   Patient with frequent ulceration related to venous stasis-seen at wound care. Edema and Venous stasis changes due to this . Echo 04/16/11 with EF 60%. No signs diastolic dysfunction.        Vaccination against Streptococcus pneumoniae within past 5 years 04/12/2020   Vaginal irritation 07/19/2019   Venous stasis dermatitis of right lower extremity 01/02/2018   Vitamin D deficiency 03/09/2016   Wears glasses    Past Surgical History:  Procedure Laterality Date   CARDIAC CATHETERIZATION  2006   CATARACT EXTRACTION W/ INTRAOCULAR LENS  IMPLANT, BILATERAL      COLONOSCOPY     DECOMPRESSIVE LUMBAR LAMINECTOMY LEVEL 2 N/A 11/05/2022   Procedure: L3-4, L4-5 DECOMPRESSIVE LUMBAR LAMINECTOMY LEVEL 2;  Surgeon: London Sheer, MD;  Location: [facility] OR;  Service: Orthopedics;  Laterality: N/A;   ELECTROCARDIOGRAM  04/16/2006   EXAMINATION UNDER ANESTHESIA N/A 06/29/2014   Procedure: EXAM UNDER ANESTHESIA;  Surgeon: Sherian Rein, MD;  Location: [facility] ORS;  Service: Gynecology;  Laterality: N/A;   Exercise myoview  01/24/2005   FLEXIBLE SIGMOIDOSCOPY     GASTRIC BYPASS  11/09/2013   JOINT REPLACEMENT     KNEE ARTHROSCOPY  2003   right   LAPAROSCOPY N/A 03/12/2016   Procedure: LAPAROSCOPIC ANASTOMOSIS OF INTESTINE (ENTEROENTEROSTOMY);  Surgeon: Everette Rank, MD;  Location: [facility] ORS;  Service: General;  Laterality: N/A;   LEG SURGERY Left    metal and pins in lower left leg   mrsa Right    arm   MULTIPLE TOOTH EXTRACTIONS     PACEMAKER LEADLESS INSERTION N/A 02/08/2020   Procedure: PACEMAKER LEADLESS INSERTION;  Surgeon: Hillis Range, MD;  Location: [facility] INVASIVE CV LAB;  Service: Cardiovascular;  Laterality: N/A;   PARATHYROIDECTOMY Left 02/19/2021   Procedure: LEFT PARATHYROIDECTOMY WITH FROZEN SECTION;  Surgeon: Serena Colonel, MD;  Location: [facility] OR;  Service: ENT;  Laterality: Left;   SHOULDER ARTHROSCOPY W/ ROTATOR CUFF REPAIR Right    stab phlebectomy  Right 02/10/2019   stab phlebectomy > 20 incisions right leg by Fabienne Bruns MD    TONSILLECTOMY     TOTAL KNEE ARTHROPLASTY Left 06/30/2018   Procedure: LEFT TOTAL KNEE ARTHROPLASTY;  Surgeon: Cammy Copa, MD;  Location: [facility];  Service: Orthopedics;  Laterality: Left;   TOTAL KNEE ARTHROPLASTY Right 11/16/2019   Procedure: RIGHT TOTAL KNEE ARTHROPLASTY-CEMENTED;  Surgeon: Cammy Copa, MD;  Location: [facility];  Service: Orthopedics;  Laterality: Right;   VARICOSE VEIN SURGERY     Remotef   Family History  Problem Relation Age of Onset   Hyperlipidemia Father    Hypertension  Father    Cirrhosis Father    Colon cancer Father 61       d. 49   Colon cancer Sister 30       d. 70   Bipolar disorder Sister    Aneurysm Mother        brain   Cerebral aneurysm Mother    Colon cancer Sister 57       d. 97   Bipolar disorder Sister    Cancer Paternal Aunt        NOS, ? colon   Congestive Heart Failure Maternal Grandmother    Drug abuse Neg Hx    CAD Neg Hx    Stomach cancer Neg Hx    Hyperparathyroidism Neg Hx    Hypercalcemia Neg Hx    Social History   Socioeconomic History   Marital status: Married    Spouse name: Tammy Sours   Number of children: 2   Years of education: 12   Highest education level: High school graduate  Occupational History   Occupation: Unemployed    Comment: disabled  Tobacco Use   Smoking status: Former    Current packs/day: 0.00    Average packs/day: 2.0 packs/day for 24.1 years (48.2 ttl pk-yrs)    Types: Cigarettes    Start date: 06/24/1970    Quit date: 08/06/1994    Years since quitting: 28.8   Smokeless tobacco: Never  Vaping Use   Vaping status: Never Used  Substance and Sexual Activity   Alcohol use: No    Alcohol/week: 0.0 standard drinks of alcohol   Drug use: No   Sexual activity: Not Currently  Other Topics Concern   Not on file  Social History Narrative   Patient lives with husband in [place]. Tammy Sours is a [facility] patient.    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    Daughter lives in nearby and is a great help.   Son lives in [place].    2 dogs and 1 cat.   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   Social Determinants of Health   Financial Resource Strain: Low Risk  (01/10/2023)   Overall Financial Resource Strain (CARDIA)  Difficulty of Paying Living Expenses: Not hard at all  Food Insecurity: No Food Insecurity (01/10/2023)   Hunger Vital Sign    Worried About Running Out of Food in the Last Year: Never true    Ran Out of Food in the Last Year: Never true  Transportation Needs: No  Transportation Needs (01/10/2023)   PRAPARE - Administrator, Civil Service (Medical): No    Lack of Transportation (Non-Medical): No  Physical Activity: Inactive (01/10/2023)   Exercise Vital Sign    Days of Exercise per Week: 0 days    Minutes of Exercise per Session: 0 min  Stress: No Stress Concern Present (01/10/2023)   [employer] of Occupational Health - Occupational Stress Questionnaire    Feeling of Stress : Only a little  Social Connections: Moderately Isolated (01/10/2023)   Social Connection and Isolation Panel [NHANES]    Frequency of Communication with Friends and Family: More than three times a week    Frequency of Social Gatherings with Friends and Family: Three times a week    Attends Religious Services: Never    Active Member of Clubs or Organizations: No    Attends Banker Meetings: Never    Marital Status: Married    Field seismologist of Daily Living  Dressing: Self-care Eating: Self-care Ambulation: Self-care Toileting: Self-care Bathing: Self-care  Instrumental Activities of Daily Living Shopping: Self-care House/Yard Work: Partial assistance, daughter helps with housework requiring more strenuous effort Administration of medications: Self-care, though regularly misses taking her packaged medications per daughter Finances: Total assistance, but patient's husband has always managed the household and income finances Telephone: Self-care Transportation: Self-care, patient is driving    FALLS in last five office visits:     05/29/2023    1:42 PM 05/09/2023    9:49 AM 02/19/2023   10:33 AM 01/20/2023    2:12 PM 01/20/2023    2:11 PM  Fall Risk   Falls in the past year? 1 1 0 1 1  Number falls in past yr:  0 0 0 0  Injury with Fall? 1 1 0 0 0    Health Maintenance reviewed: Immunization History  Administered Date(s) Administered   Fluad Quad(high Dose 65+) 03/16/2021, 04/17/2022   Fluad Trivalent(High Dose 65+) 04/28/2023    Influenza Split 04/23/2011, 04/03/2012   Influenza,inj,Quad PF,6+ Mos 03/01/2013, 04/14/2014, 07/25/2015, 02/20/2016, 02/25/2017, 03/04/2018, 02/19/2019, 04/04/2020   Moderna Sars-Covid-2 Vaccination 09/04/2019, 10/06/2019, 04/08/2020   PFIZER Comirnaty(Gray Top)Covid-19 Tri-Sucrose Vaccine 11/16/2020   PNEUMOCOCCAL CONJUGATE-20 06/01/2021   Pfizer(Comirnaty)Fall Seasonal Vaccine 12 years and older 06/28/2022, 04/28/2023   Pneumococcal Conjugate-13 04/03/2020   Pneumococcal Polysaccharide-23 07/12/2010, 11/17/2015   Td 01/18/2005   Tdap 09/17/2011   Zoster Recombinant(Shingrix) 11/12/2021   Health Maintenance Topics with due status: Overdue     Topic Date Due   DTaP/Tdap/Td 09/16/2021   Zoster Vaccines- Shingrix 01/07/2022   MAMMOGRAM 10/12/2022   OPHTHALMOLOGY EXAM 01/29/2023    Vital Signs Weight: 163 lb 6 oz (74.1 kg) Body mass index is 24.84 kg/m. CrCl cannot be calculated (Patient's most recent lab result is older than the maximum 21 days allowed.). Body surface area is 1.89 meters squared. Vitals:   05/29/23 1342 05/29/23 1441  BP: (!) 129/57 (!) 110/57  Pulse: 78   SpO2: 100%   Weight: 163 lb 6 oz (74.1 kg)   Height: 5\' 8"  (1.727 m)    Wt Readings from Last 3 Encounters:  05/29/23 163 lb 6 oz (74.1 kg)  05/27/23 163 lb  8 oz (74.2 kg)  05/09/23 163 lb (73.9 kg)   Hearing Screening   250Hz  500Hz  1000Hz  2000Hz  4000Hz   Right ear 40 40 Fail Fail Fail  Left ear 40 40 Fail 40 40   Vision Screening   Right eye Left eye Both eyes  Without correction     With correction 20/40 20/20 20/25     Physical Examination:  VS reviewed Physical Exam  Vitals:   05/29/23 1342 05/29/23 1441  BP: (!) 129/57 (!) 110/57  Pulse: 78   SpO2: 100%           01/10/2023    2:01 PM 06/12/2021   10:00 AM 11/10/2018   10:11 AM  6CIT Screen  What Year? 0 points 0 points 0 points  What month? 0 points 0 points 0 points  What time? 0 points 0 points 0 points  Count back from 20  0 points 0 points 0 points  Months in reverse 2 points 0 points 0 points  Repeat phrase 2 points 0 points 0 points  Total Score 4 points 0 points 0 points       02/10/2023    2:11 PM  MMSE - Mini Mental State Exam  Orientation to time 3  Orientation to Place 5  Registration 3  Attention/ Calculation 5  Recall 2  Language- name 2 objects 2  Language- repeat 0  Language- follow 3 step command 3  Language- read & follow direction 1  Write a sentence 1  Copy design 1  Total score 26           02/10/2023    2:17 PM 09/11/2020    1:24 PM  Montreal Cognitive Assessment   Visuospatial/ Executive (0/5) 2 5  Naming (0/3) 2 2  Attention: Read list of digits (0/2) 1 2  Attention: Read list of letters (0/1) 0 1  Attention: Serial 7 subtraction starting at 100 (0/3) 3 3  Language: Repeat phrase (0/2) 1 1  Language : Fluency (0/1) 0 1  Abstraction (0/2) 2 1  Delayed Recall (0/5) 3 4  Orientation (0/6) 4 6  Total 18 26  Adjusted Score (based on education)  27     Labs No components found for: "VITAMIND"  Lab Results  Component Value Date   VITAMINB12 >2000 (H) 04/28/2023    Lab Results  Component Value Date   FOLATE 7.5 05/29/2023    Lab Results  Component Value Date   TSH 2.410 01/20/2023    No results found for: "RPR"  Lab Results  Component Value Date   HIV Non Reactive 02/19/2023      Chemistry      Component Value Date/Time   NA 142 01/22/2023 1112   NA 138 11/10/2013 0526   K 4.6 01/22/2023 1112   K 4.0 11/10/2013 0526   CL 106 01/22/2023 1112   CL 106 11/10/2013 0526   CO2 22 01/22/2023 1112   CO2 28 11/10/2013 0526   BUN 24 01/22/2023 1112   BUN 14 11/10/2013 0526   CREATININE 1.40 (H) 01/22/2023 1112   CREATININE 0.70 03/08/2016 1530      Component Value Date/Time   CALCIUM 9.2 01/22/2023 1112   CALCIUM 9.6 05/25/2015 1551   ALKPHOS 82 10/30/2022 1000   ALKPHOS 117 (H) 05/07/2017 0000   AST 16 10/30/2022 1000   AST 15 11/08/2013 1618    ALT 9 10/30/2022 1000   ALT 8 05/07/2017 0000   BILITOT 0.5 10/30/2022 1000   BILITOT 0.4  11/05/2021 1423   BILITOT 0.4 11/08/2013 1618       CrCl cannot be calculated (Patient's most recent lab result is older than the maximum 21 days allowed.).   Lab Results  Component Value Date   HGBA1C 5.7 05/09/2023     @10RELATIVEDAYS @ Hearing Screening   250Hz  500Hz  1000Hz  2000Hz  4000Hz   Right ear 40 40 Fail Fail Fail  Left ear 40 40 Fail 40 40   Vision Screening   Right eye Left eye Both eyes  Without correction     With correction 20/40 20/20 20/25    Lab Results  Component Value Date   WBC 5.1 01/20/2023   HGB 10.6 (L) 01/20/2023   HCT 33.5 (L) 01/20/2023   MCV 85 01/20/2023   PLT 206 01/20/2023    Results for orders placed or performed in visit on 05/29/23 (from the past 24 hour(s))  Vitamin B1   Collection Time: 05/29/23  3:10 PM  Result Value Ref Range   Thiamine WILL FOLLOW   Vitamin B6   Collection Time: 05/29/23  3:10 PM  Result Value Ref Range   Vitamin B6 WILL FOLLOW   Iron, TIBC and Ferritin Panel   Collection Time: 05/29/23  3:10 PM  Result Value Ref Range   Total Iron Binding Capacity 275 250 - 450 ug/dL   UIBC 865 [id number] - 696 ug/dL   Iron 24 (L) 27 - 295 ug/dL   Iron Saturation 9 (LL) 15 - 55 %   Ferritin 138 15 - 150 ng/mL  Folate   Collection Time: 05/29/23  3:10 PM  Result Value Ref Range   Folate 7.5 >3.0 ng/mL  Zinc   Collection Time: 05/29/23  3:10 PM  Result Value Ref Range   Zinc WILL FOLLOW    *Note: Due to a large number of results and/or encounters for the requested time period, some results have not been displayed. A complete set of results can be found in Results Review.   Brain MRI: 04/18/23: unremarkable   Advanced Directives Code Status: Full Advance Directives: None  completed ------------------------------------------------------------------------------------------------------------------------------------------------------------------------------------------------------------------------------------------------------------------------------------------------------------------------------------------------------------------------------------------------------------------------------------------------------------------------------------------------------------  Assessment and Plan: Please see individual consultation notes from physical therapy, pharmacy and social work for today.    Problem List Items Addressed This Visit       Medium    Obesity with sleep apnea    Lack of use of the CPAP can result in cognitive impairment.      Cognitive impairment    Mrs Swecker had been previously evaluated in [facility] (09/07/20) where I thought her MoCA or 27/30 and her preserved iADLs were consistent with just age-related cognitive decline.   Since that time, it seems Mrs Fearn memory and difficulty with language has progressively worsened.  She was evaluated by Dr Terrace Arabia ([facility]) (02/10/23) for these concerns. Her cognitive testing showed a significant discrepancy between her MoCA score of 18/30 and MMSE on the same day of 26/30.  Dr Terrace Arabia ordered a Brain MRI to look for a left hemispheric lesion.   Brain MRI (04/18/23) was read by radiology as unremarkable.    While Mrs Olsen reported independence in her iADLs, her daughter reported that Ms Duke's car regularly has new dints that Mrs Spainhower is unable to recall how they happened.  Additionally, Ms Cargle said she takes her medications, which are pre-packaged, her daughter reports that she not infrequently finds that her mother has missed taking her medications.  Otherwise, Mrs Gunnison appears to be independent in her iADLs/ADLs.   A/  Cognitive Impairment with dysnomia.  - There is a decline in MoCA from 27/30 in 2022 to  18/30 in August. This is inconsistent with the evident cognitive test score between the MMSE 26/30 and the 18/30 on the same day in August.  - During the interview, Mrs Kensinger displays difficulty with recalling the names of things or places, including the name of her daughter, [name]. This word-finding difficulty slows the speech of sentence production without eventual ability to convey her intended meaning.  She did not display difficulty with language comprehension during the usual clinical interview.  - It appears Dr Terrace Arabia has asked patient/family to schedule patient for neuropsychological testing, thought I do not believe this has happened.  Given the predominance of dysnomia with less prominent declines in other cognitive domains, it seems prudent that Mrs Heffern proceed with this testing, then follow up with Dr Terrace Arabia.   We did discuss cognitive impairment and lifestyle interventions to slow the decline of memory, including regular aerobic exercise, incorporating components of the Mediterranean diet, and the role of socialization in preserving memory.    We discussed Mrs Hanak continuing to drive.  The daughter has some concerns given the unexplained dints that are accumulating on the patient's car.  We talked about driving just to frequently visited locations, driving only during the day, avoid driving on highways, not driving in bad weather.  It sound like Mrs Willenbrink has already been self-restricting her driving.   Advanced directive packet given to patient/daughter - components explained.    -         Low   S/P bariatric surgery-duodenal switch with sleeve gastrectomy (Chronic)    Mrs Littlepage is not taking vitamin supplements appropriate to gastic bypass surgery. Will check B1, B6, Folate, Zinc, iron panel.  Recommend Mrs Burgert restart her Bariatric vitamins after gastric sleeve procedure including MVI, Calcium with Vitamin D, Vitamin C, and B12.        Presbycusis of both ears    Hearing  Screening   250Hz  500Hz  1000Hz  2000Hz  4000Hz   Right ear 40 40 Fail Fail Fail  Left ear 40 40 Fail 40 40   Hearing loss, especially in higher frequencies, that may be impairing Ms Rye ability to understand what is being said.   She may want to explore hearing aids.  Her current language difficulty would not be harmed by improvement in her hearing.       Abnormality of gait due to impairment of balance    Falls    05/29/2023    1:42 PM 05/09/2023    9:49 AM 02/19/2023   10:33 AM 01/20/2023    2:12 PM 01/20/2023    2:11 PM  Fall Risk   Falls in the past year? 1 1 0 1 1  Number falls in past yr:  0 0 0 0  Injury with Fall? 1 1 0 0 0    @10RELATIVEDAYS @    01/20/2023    2:11 PM 01/20/2023    2:12 PM 02/19/2023   10:33 AM 05/09/2023    9:49 AM 05/29/2023    1:42 PM  Fall Risk  Falls in the past year? 1 1 0 1 1  Was there an injury with Fall? 0 0 0 1 1  Fall Risk Category Calculator 1 1 0 2      FALL Number of falls in last year: >=2 Location: yard, bedroom Activity prior to fall: turning around in both cases Change in position prior to fall: No sit/lying  to standing prior to fall. All from Standing.  Dizziness prior to fall:  no Syncope prior to fall:  no Vertigo:  no  Something that caused the fall (steps, curbs, uneven surface, slippery surface, shoes, etc.): not certain Loss of balance:   yes Slip: no Trip: no Slide:  no  What Body parts struck object(s) or ground: back of head What object or surface was struck: bedstead in bedroom falls, ground in yard fall.  Injury: at least soft tissue contusions of head both times.  Patient not evaluated by healthcare provider.  \Able to get up unassisted when you fell: yes, both times, but fall in bedroom required patient to drag herself to side of bed to push up.   Worry about falling:  yes Limited activities because of fear of falling: no Problems doing ADLs: yes   Vision 20/25 both eyes  Hx of problems with legs  (strength, pain):  recent decompressive lumbar laminectomy for symptomatic lumbar stenosis  Patient has diabetic peripheral neuropathy Hx of problems with balance: yes  Hx of Cogntive Impairment: yes, mild  Hx of alcohol use disorder: no    A/Falls with balance impairment P/Referral to Physical Therapy for evaluation and treatment.          Unprioritized   Caregiver stress    Established problem that has improved since her husband started receiving much more home health aide services that have helped relieve Mrs Kerrick of some physically demanding care tasks for her husband.   Ms Scheel had recent Lumbar decompression surgery for symptomatic spinal stenosis.        Other Visit Diagnoses     Nutritional deficiency disorder    -  Primary   Relevant Orders   Vitamin B1 (Completed)   Vitamin B6 (Completed)   Iron, TIBC and Ferritin Panel (Completed)   Folate (Completed)   Zinc (Completed)   Falls       Relevant Orders   Ambulatory referral to Physical Therapy      Obesity with sleep apnea Lack of use of the CPAP can result in cognitive impairment.  S/P bariatric surgery-duodenal switch with sleeve gastrectomy Mrs Neugebauer is not taking vitamin supplements appropriate to gastic bypass surgery. Will check B1, B6, Folate, Zinc, iron panel.  Recommend Mrs Revelo restart her Bariatric vitamins after gastric sleeve procedure including MVI, Calcium with Vitamin D, Vitamin C, and B12.    Presbycusis of both ears Hearing Screening   250Hz  500Hz  1000Hz  2000Hz  4000Hz   Right ear 40 40 Fail Fail Fail  Left ear 40 40 Fail 40 40   Hearing loss, especially in higher frequencies, that may be impairing Ms Blomquist ability to understand what is being said.   She may want to explore hearing aids.  Her current language difficulty would not be harmed by improvement in her hearing.   Cognitive impairment Mrs Reposa had been previously evaluated in [facility] (09/07/20) where I thought her MoCA  or 27/30 and her preserved iADLs were consistent with just age-related cognitive decline.   Since that time, it seems Mrs Blann memory and difficulty with language has progressively worsened.  She was evaluated by Dr Terrace Arabia (Ne[facility]) (02/10/23) for these concerns. Her cognitive testing showed a significant discrepancy between her MoCA score of 18/30 and MMSE on the same day of 26/30.  Dr Terrace Arabia ordered a Brain MRI to look for a left hemispheric lesion.   Brain MRI (04/18/23) was read by radiology as unremarkable.    While  Mrs Mclaine reported independence in her iADLs, her daughter reported that Ms Duke's car regularly has new dints that Mrs Antrim is unable to recall how they happened.  Additionally, Ms Sciortino said she takes her medications, which are pre-packaged, her daughter reports that she not infrequently finds that her mother has missed taking her medications.  Otherwise, Mrs Bava appears to be independent in her iADLs/ADLs.   A/ Cognitive Impairment with dysnomia.  - There is a decline in MoCA from 27/30 in 2022 to 18/30 in August. This is inconsistent with the evident cognitive test score between the MMSE 26/30 and the 18/30 on the same day in August.  - During the interview, Mrs Plato displays difficulty with recalling the names of things or places, including the name of her daughter, [name]. This word-finding difficulty slows the speech of sentence production without eventual ability to convey her intended meaning.  She did not display difficulty with language comprehension during the usual clinical interview.  - It appears Dr Terrace Arabia has asked patient/family to schedule patient for neuropsychological testing, thought I do not believe this has happened.  Given the predominance of dysnomia with less prominent declines in other cognitive domains, it seems prudent that Mrs Massie proceed with this testing, then follow up with Dr Terrace Arabia.   We did discuss cognitive impairment and lifestyle interventions to  slow the decline of memory, including regular aerobic exercise, incorporating components of the Mediterranean diet, and the role of socialization in preserving memory.    We discussed Mrs Zhen continuing to drive.  The daughter has some concerns given the unexplained dints that are accumulating on the patient's car.  We talked about driving just to frequently visited locations, driving only during the day, avoid driving on highways, not driving in bad weather.  It sound like Mrs Swierk has already been self-restricting her driving.   Advanced directive packet given to patient/daughter - components explained.    -   Caregiver stress Established problem that has improved since her husband started receiving much more home health aide services that have helped relieve Mrs Borkenhagen of some physically demanding care tasks for her husband.   Ms Rodick had recent Lumbar decompression surgery for symptomatic spinal stenosis.    Abnormality of gait due to impairment of balance Falls    05/29/2023    1:42 PM 05/09/2023    9:49 AM 02/19/2023   10:33 AM 01/20/2023    2:12 PM 01/20/2023    2:11 PM  Fall Risk   Falls in the past year? 1 1 0 1 1  Number falls in past yr:  0 0 0 0  Injury with Fall? 1 1 0 0 0    @10RELATIVEDAYS @    01/20/2023    2:11 PM 01/20/2023    2:12 PM 02/19/2023   10:33 AM 05/09/2023    9:49 AM 05/29/2023    1:42 PM  Fall Risk  Falls in the past year? 1 1 0 1 1  Was there an injury with Fall? 0 0 0 1 1  Fall Risk Category Calculator 1 1 0 2      FALL Number of falls in last year: >=2 Location: yard, bedroom Activity prior to fall: turning around in both cases Change in position prior to fall: No sit/lying to standing prior to fall. All from Standing.  Dizziness prior to fall:  no Syncope prior to fall:  no Vertigo:  no  Something that caused the fall (steps, curbs, uneven surface, slippery surface,  shoes, etc.): not certain Loss of balance:   yes Slip: no Trip:  no Slide:  no  What Body parts struck object(s) or ground: back of head What object or surface was struck: bedstead in bedroom falls, ground in yard fall.  Injury: at least soft tissue contusions of head both times.  Patient not evaluated by healthcare provider.  \Able to get up unassisted when you fell: yes, both times, but fall in bedroom required patient to drag herself to side of bed to push up.   Worry about falling:  yes Limited activities because of fear of falling: no Problems doing ADLs: yes   Vision 20/25 both eyes  Hx of problems with legs (strength, pain):  recent decompressive lumbar laminectomy for symptomatic lumbar stenosis  Patient has diabetic peripheral neuropathy Hx of problems with balance: yes  Hx of Cogntive Impairment: yes, mild  Hx of alcohol use disorder: no    A/Falls with balance impairment P/Referral to Physical Therapy for evaluation and treatment.       Primary Contact: Extended Emergency Contact Information Primary Emergency Contact: Roylene Reason [place] Home Phone: [phone] Mobile Phone: [phone] Relation: Daughter Secondary Emergency Contact: Cragle,Greg Address: [address]          [place], [place] [address] [place] Home Phone: [phone] Relation: Spouse   51 minutes face to face were spent in total with precharting, history & physical gathering, nterdisciplinary discussion, patient and caretaker counseling and coordination of care and documentation. TThe interdisciplinary team consisted of representatives from medicine, pharmacy.

## 2023-05-29 NOTE — Patient Instructions (Signed)
To keep your memory strong:  Exercise Healthy eating, especially the Mediterranean Diet Being social.

## 2023-05-30 ENCOUNTER — Encounter: Payer: Self-pay | Admitting: Family Medicine

## 2023-05-30 DIAGNOSIS — Z636 Dependent relative needing care at home: Secondary | ICD-10-CM | POA: Insufficient documentation

## 2023-05-30 NOTE — Assessment & Plan Note (Signed)
[name] is not taking vitamin supplements appropriate to gastic bypass surgery. Will check B1, B6, Folate, Zinc, iron panel.  Recommend [name] Faiola restart her Bariatric vitamins after gastric sleeve procedure including MVI, Calcium with Vitamin D, Vitamin C, and B12.

## 2023-05-30 NOTE — Assessment & Plan Note (Addendum)
Falls    05/29/2023    1:42 PM 05/09/2023    9:49 AM 02/19/2023   10:33 AM 01/20/2023    2:12 PM 01/20/2023    2:11 PM  Fall Risk   Falls in the past year? 1 1 0 1 1  Number falls in past yr:  0 0 0 0  Injury with Fall? 1 1 0 0 0    @10RELATIVEDAYS @    01/20/2023    2:11 PM 01/20/2023    2:12 PM 02/19/2023   10:33 AM 05/09/2023    9:49 AM 05/29/2023    1:42 PM  Fall Risk  Falls in the past year? 1 1 0 1 1  Was there an injury with Fall? 0 0 0 1 1  Fall Risk Category Calculator 1 1 0 2      FALL Number of falls in last year: >=2 Location: yard, bedroom Activity prior to fall: turning around in both cases Change in position prior to fall: No sit/lying to standing prior to fall. All from Standing.  Dizziness prior to fall:  no Syncope prior to fall:  no Vertigo:  no  Something that caused the fall (steps, curbs, uneven surface, slippery surface, shoes, etc.): not certain Loss of balance:   yes Slip: no Trip: no Slide:  no  What Body parts struck object(s) or ground: back of head What object or surface was struck: bedstead in bedroom falls, ground in yard fall.  Injury: at least soft tissue contusions of head both times.  Patient not evaluated by healthcare provider.  \Able to get up unassisted when you fell: yes, both times, but fall in bedroom required patient to drag herself to side of bed to push up.   Worry about falling:  yes Limited activities because of fear of falling: no Problems doing ADLs: yes   Vision 20/25 both eyes  Hx of problems with legs (strength, pain):  recent decompressive lumbar laminectomy for symptomatic lumbar stenosis  Patient has diabetic peripheral neuropathy Hx of problems with balance: yes  Hx of Cogntive Impairment: yes, mild  Hx of alcohol use disorder: no    A/Falls with balance impairment P/Referral to Physical Therapy for evaluation and treatment.

## 2023-05-30 NOTE — Assessment & Plan Note (Addendum)
[name] had been previously evaluated in [facility] (09/07/20) where I thought her MoCA or 27/30 and her preserved iADLs were consistent with just age-related cognitive decline.   Since that time, it seems [name] memory and difficulty with language has progressively worsened.  She was evaluated by Dr Terrace Arabia ([facility]) (02/10/23) for these concerns. Her cognitive testing showed a significant discrepancy between her MoCA score of 18/30 and MMSE on the same day of 26/30.  Dr Terrace Arabia ordered a Brain MRI to look for a left hemispheric lesion.   Brain MRI (04/18/23) was read by radiology as unremarkable.    While [name] reported independence in her iADLs, her daughter reported that [name]'s car regularly has new dints that [name] is unable to recall how they happened.  Additionally, [name] said she takes her medications, which are pre-packaged, her daughter reports that she not infrequently finds that her mother has missed taking her medications.  Otherwise, [name] appears to be independent in her iADLs/ADLs.   A/ Cognitive Impairment with dysnomia.  - There is a decline in MoCA from 27/30 in 2022 to 18/30 in August. This is inconsistent with the evident cognitive test score between the MMSE 26/30 and the 18/30 on the same day in August.  - During the interview, [name] displays difficulty with recalling the names of things or places, including the name of her daughter, [name]. This word-finding difficulty slows the speech of sentence production without eventual ability to convey her intended meaning.  She did not display difficulty with language comprehension during the usual clinical interview.  - It appears Dr Terrace Arabia has asked patient/family to schedule patient for neuropsychological testing, thought I do not believe this has happened.  Given the predominance of dysnomia with less prominent declines in other cognitive domains, it seems prudent that [name] proceed with this testing, then follow  up with Dr Terrace Arabia.   We did discuss cognitive impairment and lifestyle interventions to slow the decline of memory, including regular aerobic exercise, incorporating components of the Mediterranean diet, and the role of socialization in preserving memory.    We discussed [name] continuing to drive.  The daughter has some concerns given the unexplained dints that are accumulating on the patient's car.  We talked about driving just to frequently visited locations, driving only during the day, avoid driving on highways, not driving in bad weather.  It sound like [name] has already been self-restricting her driving.   Advanced directive packet given to patient/daughter - components explained.    -

## 2023-05-30 NOTE — Assessment & Plan Note (Signed)
Established problem that has improved since her husband started receiving much more home health aide services that have helped relieve [name] of some physically demanding care tasks for her husband.   [name] had recent Lumbar decompression surgery for symptomatic spinal stenosis.

## 2023-05-30 NOTE — Assessment & Plan Note (Signed)
Hearing Screening   250Hz  500Hz  1000Hz  2000Hz  4000Hz   Right ear 40 40 Fail Fail Fail  Left ear 40 40 Fail 40 40   Hearing loss, especially in higher frequencies, that may be impairing [name] ability to understand what is being said.   She may want to explore hearing aids.  Her current language difficulty would not be harmed by improvement in her hearing.

## 2023-05-30 NOTE — Assessment & Plan Note (Signed)
Lack of use of the CPAP can result in cognitive impairment.

## 2023-06-01 NOTE — Progress Notes (Signed)
[name], [name] ([id number]) [id number] Page 1 of 7 Visit Report for 05/27/2023 Debridement Details Patient Name: Date of Service: [name], [name] 05/27/2023 11:00 A M Medical Record Number: [id number] Patient Account Number: [id number] Date of Birth/Sex: Treating RN: Oct 20, 1955 (67 y.o. F) Primary Care Provider: Alfredo Martinez Other Clinician: Referring Provider: Treating Provider/Extender: Arnette Schaumann, [name] in Treatment: 3 Debridement Performed for Assessment: Wound #9 Right,Distal,Anterior Lower Leg Performed By: Physician Maxwell Caul., MD The following information was scribed by: Fonnie Mu The information was scribed for: Baltazar Najjar Debridement Type: Debridement Severity of Tissue Pre Debridement: Fat layer exposed Level of Consciousness (Pre-procedure): Awake and Alert Pre-procedure Verification/Time Out Yes - 11:30 Taken: Start Time: 11:30 Pain Control: Lidocaine Percent of Wound Bed Debrided: 100% T Area Debrided (cm): otal 3.92 Tissue and other material debrided: Viable, Non-Viable, Eschar, Slough, Skin: Dermis , Skin: Epidermis, Slough Level: Skin/Epidermis Debridement Description: Selective/Open Wound Instrument: Curette Bleeding: Minimum Hemostasis Achieved: Pressure End Time: 11:30 Procedural Pain: 0 Post Procedural Pain: 0 Response to Treatment: Procedure was tolerated well Level of Consciousness (Post- Awake and Alert procedure): Post Debridement Measurements of Total Wound Length: (cm) 2.5 Width: (cm) 2 Depth: (cm) 0.1 Volume: (cm) 0.393 Character of Wound/Ulcer Post Debridement: Improved Severity of Tissue Post Debridement: Fat layer exposed Post Procedure Diagnosis Same as Pre-procedure Electronic Signature(s) Signed: 06/01/2023 9:47:40 AM By: Baltazar Najjar MD Previous Signature: 05/28/2023 8:13:22 AM Version By: Fonnie Mu RN Entered By: Baltazar Najjar on 05/29/2023  07:46:32 -------------------------------------------------------------------------------- HPI Details Patient Name: Date of Service: [name]. 05/27/2023 11:00 A M Medical Record Number: [id number] Patient Account Number: [id number] Date of Birth/Sex: Treating RN: 1956-05-22 (67 y.o. F) Primary Care Provider: Alfredo Martinez Other Clinician: Referring Provider: Treating Provider/Extender: [name], [name], [name] Pain ([id number]) [id number] Page 2 of 7 Weeks in Treatment: 3 History of Present Illness HPI Description: ADMISSION 05/02/2023 ***ABIs R: 0.88, palpable pulses*** This is a 67 year old woman with well-controlled type 2 diabetes (last hemoglobin A1c 5.4%) presenting to the wound care center with two ulcers on her right lower leg. She saw her primary care provider earlier this week and per their note, she has had lower leg swelling and erythema for about 2 months. Her PCP prescribed Augmentin and doxycycline. 05/12/2023: Both wounds measured smaller today. There is some slough and eschar accumulation on each. She saw her PCP who removed our wraps for some reason and therefore her edema control is not ideal. 05/20/2023: The proximal wound measured smaller today. The distal wound measured slightly larger, but this does incorporate a fair amount of scab/eschar. Edema control is much better this week. 12/3; the patient was in for a nurse visit to change her dressings today. I was asked to look at the wound which had a large dried eschar over the surface on the right anterior lower leg I suspect this is mostly dried blood from last week. Electronic Signature(s) Signed: 06/01/2023 9:47:40 AM By: Baltazar Najjar MD Entered By: Baltazar Najjar on 05/29/2023 07:47:30 -------------------------------------------------------------------------------- Physical Exam Details Patient Name: Date of Service: [name], [name] 05/27/2023 11:00 A  M Medical Record Number: [id number] Patient Account Number: [id number] Date of Birth/Sex: Treating RN: 04/07/1956 (67 y.o. F) Primary Care Provider: Alfredo Martinez Other Clinician: Referring Provider: Treating Provider/Extender: Arnette Schaumann, [name] in Treatment: 3 Constitutional Sitting or standing Blood Pressure is within target range for patient.. Pulse regular and within target range for patient.Marland Kitchen Respirations regular, non-labored and within target range.. Temperature is normal and within  the target range for the patient.Marland Kitchen Appears in no distress. Notes Wound exam; the patient had a large dried eschar on the right anterior lower leg most of which I think was dried blood although it may have incorporated some collagen. I used a #5 curette to remove this very tiny wound underneath. The appearance of this may be wonder whether this was a bleeding venule Electronic Signature(s) Signed: 06/01/2023 9:47:40 AM By: Baltazar Najjar MD Entered By: Baltazar Najjar on 05/29/2023 07:49:10 -------------------------------------------------------------------------------- Physician Orders Details Patient Name: Date of Service: [name]. 05/27/2023 11:00 A M Medical Record Number: [id number] Patient Account Number: [id number] Date of Birth/Sex: Treating RN: 08/19/1955 (67 y.o. [name], [name] Primary Care Provider: Alfredo Martinez Other Clinician: Referring Provider: Treating Provider/Extender: Arnette Schaumann, Jake Samples in Treatment: 3 Verbal / Phone Orders: No Diagnosis Coding Follow-up Appointments [name], [name] ([id number]) [id number] Page 3 of 7 ppointment in 1 week. - Dr. Lady Gary RM 2 Return A Anesthetic (In clinic) Topical Lidocaine 4% applied to wound bed Bathing/ Shower/ Hygiene May shower with protection but do not get wound dressing(s) wet. Protect dressing(s) with water repellant cover (for example, large plastic bag) or a  cast cover and may then take shower. Edema Control - Orders / Instructions Elevate legs to the level of the heart or above for 30 minutes daily and/or when sitting for 3-4 times a day throughout the day. Avoid standing for long periods of time. Exercise regularly Wound Treatment Wound #8 - Lower Leg Wound Laterality: Right, Anterior, Proximal Cleanser: Soap and Water 1 x Per Week/30 Days Discharge Instructions: May shower and wash wound with dial antibacterial soap and water prior to dressing change. Cleanser: Wound Cleanser 1 x Per Week/30 Days Discharge Instructions: Cleanse the wound with wound cleanser prior to applying a clean dressing using gauze sponges, not tissue or cotton balls. Peri-Wound Care: Sween Lotion (Moisturizing lotion) 1 x Per Week/30 Days Discharge Instructions: Apply moisturizing lotion as directed Prim Dressing: Maxorb Extra Ag+ Alginate Dressing, 2x2 (in/in) 1 x Per Week/30 Days ary Discharge Instructions: Apply to wound bed as instructed Secondary Dressing: ABD Pad, 8x10 1 x Per Week/30 Days Discharge Instructions: Apply over primary dressing as directed. Compression Wrap: Kerlix Roll 4.5x3.1 (in/yd) 1 x Per Week/30 Days Discharge Instructions: Apply Kerlix and Coban compression as directed. Compression Wrap: Coban Self-Adherent Wrap 4x5 (in/yd) 1 x Per Week/30 Days Discharge Instructions: Apply over Kerlix as directed. Wound #9 - Lower Leg Wound Laterality: Right, Anterior, Distal Cleanser: Soap and Water 1 x Per Week/30 Days Discharge Instructions: May shower and wash wound with dial antibacterial soap and water prior to dressing change. Cleanser: Wound Cleanser 1 x Per Week/30 Days Discharge Instructions: Cleanse the wound with wound cleanser prior to applying a clean dressing using gauze sponges, not tissue or cotton balls. Peri-Wound Care: Sween Lotion (Moisturizing lotion) 1 x Per Week/30 Days Discharge Instructions: Apply moisturizing lotion as  directed Prim Dressing: Maxorb Extra Ag+ Alginate Dressing, 2x2 (in/in) 1 x Per Week/30 Days ary Discharge Instructions: Apply to wound bed as instructed Secondary Dressing: ABD Pad, 8x10 1 x Per Week/30 Days Discharge Instructions: Apply over primary dressing as directed. Compression Wrap: Kerlix Roll 4.5x3.1 (in/yd) 1 x Per Week/30 Days Discharge Instructions: Apply Kerlix and Coban compression as directed. Compression Wrap: Coban Self-Adherent Wrap 4x5 (in/yd) 1 x Per Week/30 Days Discharge Instructions: Apply over Kerlix as directed. Electronic Signature(s) Signed: 05/28/2023 8:13:47 AM By: Fonnie Mu RN Signed: 06/01/2023 9:47:40 AM By: Baltazar Najjar MD  Entered By: Fonnie Mu on 05/28/2023 08:13:47 -------------------------------------------------------------------------------- Problem List Details Patient Name: Date of Service: BRIENA, MALTBIE 05/27/2023 11:00 A [name], [name] ([id number]) [phone].pdf Page 4 of 7 Medical Record Number: [id number] Patient Account Number: [id number] Date of Birth/Sex: Treating RN: 05-09-1956 (67 y.o. F) Primary Care Provider: Alfredo Martinez Other Clinician: Referring Provider: Treating Provider/Extender: Arnette Schaumann, [name] in Treatment: 3 Active Problems ICD-10 Encounter Code Description Active Date MDM Diagnosis L97.812 Non-pressure chronic ulcer of other part of right lower leg with fat layer 05/02/2023 No Yes exposed E11.622 Type 2 diabetes mellitus with other skin ulcer 05/02/2023 No Yes R60.0 Localized edema 05/02/2023 No Yes Inactive Problems Resolved Problems Electronic Signature(s) Signed: 06/01/2023 9:47:40 AM By: Baltazar Najjar MD Entered By: Baltazar Najjar on 05/29/2023 07:45:43 -------------------------------------------------------------------------------- Progress Note Details Patient Name: Date of Service: [name] 05/27/2023 11:00 A M Medical Record Number:  [id number] Patient Account Number: [id number] Date of Birth/Sex: Treating RN: 03-06-1956 (67 y.o. F) Primary Care Provider: Alfredo Martinez Other Clinician: Referring Provider: Treating Provider/Extender: Arnette Schaumann, [name] in Treatment: 3 Subjective History of Present Illness (HPI) ADMISSION 05/02/2023 ***ABIs R: 0.88, palpable pulses*** This is a 67 year old woman with well-controlled type 2 diabetes (last hemoglobin A1c 5.4%) presenting to the wound care center with two ulcers on her right lower leg. She saw her primary care provider earlier this week and per their note, she has had lower leg swelling and erythema for about 2 months. Her PCP prescribed Augmentin and doxycycline. 05/12/2023: Both wounds measured smaller today. There is some slough and eschar accumulation on each. She saw her PCP who removed our wraps for some reason and therefore her edema control is not ideal. 05/20/2023: The proximal wound measured smaller today. The distal wound measured slightly larger, but this does incorporate a fair amount of scab/eschar. Edema control is much better this week. 12/3; the patient was in for a nurse visit to change her dressings today. I was asked to look at the wound which had a large dried eschar over the surface on the right anterior lower leg I suspect this is mostly dried blood from last week. Objective [name], [name] ([id number]) [id number] Page 5 of 7 Constitutional Sitting or standing Blood Pressure is within target range for patient.. Pulse regular and within target range for patient.Marland Kitchen Respirations regular, non-labored and within target range.. Temperature is normal and within the target range for the patient.Marland Kitchen Appears in no distress. Vitals Time Taken: 11:30 AM, Height: 70 in, Weight: 166 lbs, BMI: 23.8, Temperature: 98.7 F, Pulse: 74 bpm, Respiratory Rate: 17 breaths/min, Blood Pressure: 120/70 mmHg. General Notes: Wound exam;  the patient had a large dried eschar on the right anterior lower leg most of which I think was dried blood although it may have incorporated some collagen. I used a #5 curette to remove this very tiny wound underneath. The appearance of this may be wonder whether this was a bleeding venule Integumentary (Hair, Skin) Wound #8 status is Open. Original cause of wound was Gradually Appeared. The date acquired was: 03/27/2023. The wound has been in treatment 3 weeks. The wound is located on the Right,Proximal,Anterior Lower Leg. The wound measures 0.5cm length x 0.7cm width x 0.1cm depth; 0.275cm^2 area and 0.027cm^3 volume. There is Fat Layer (Subcutaneous Tissue) exposed. There is no tunneling or undermining noted. There is a medium amount of serosanguineous drainage noted. The wound margin is distinct with the outline attached to the wound base. There is small (1-33%)  pink granulation within the wound bed. There is a large (67-100%) amount of necrotic tissue within the wound bed including Adherent Slough. The periwound skin appearance had no abnormalities noted for texture. The periwound skin appearance had no abnormalities noted for moisture. The periwound skin appearance had no abnormalities noted for color. Periwound temperature was noted as No Abnormality. Wound #9 status is Open. Original cause of wound was Trauma. The date acquired was: 04/10/2023. The wound has been in treatment 3 weeks. The wound is located on the Right,Distal,Anterior Lower Leg. The wound measures 2.5cm length x 2cm width x 0.1cm depth; 3.927cm^2 area and 0.393cm^3 volume. There is Fat Layer (Subcutaneous Tissue) exposed. There is no tunneling or undermining noted. There is a medium amount of sanguinous drainage noted. The wound margin is distinct with the outline attached to the wound base. There is small (1-33%) red granulation within the wound bed. There is a large (67-100%) amount of necrotic tissue within the wound bed  including Eschar. The periwound skin appearance had no abnormalities noted for texture. The periwound skin appearance had no abnormalities noted for color. The periwound skin appearance exhibited: Maceration. The periwound skin appearance did not exhibit: Dry/Scaly. Periwound temperature was noted as No Abnormality. Assessment Active Problems ICD-10 Non-pressure chronic ulcer of other part of right lower leg with fat layer exposed Type 2 diabetes mellitus with other skin ulcer Localized edema Procedures Wound #9 Pre-procedure diagnosis of Wound #9 is a Venous Leg Ulcer located on the Right,Distal,Anterior Lower Leg .Severity of Tissue Pre Debridement is: Fat layer exposed. There was a Selective/Open Wound Skin/Epidermis Debridement with a total area of 3.92 sq cm performed by Maxwell Caul., MD. With the following instrument(s): Curette to remove Viable and Non-Viable tissue/material. Material removed includes Eschar, Slough, Skin: Dermis, and Skin: Epidermis after achieving pain control using Lidocaine. No specimens were taken. A time out was conducted at 11:30, prior to the start of the procedure. A Minimum amount of bleeding was controlled with Pressure. The procedure was tolerated well with a pain level of 0 throughout and a pain level of 0 following the procedure. Post Debridement Measurements: 2.5cm length x 2cm width x 0.1cm depth; 0.393cm^3 volume. Character of Wound/Ulcer Post Debridement is improved. Severity of Tissue Post Debridement is: Fat layer exposed. Post procedure Diagnosis Wound #9: Same as Pre-Procedure Plan Follow-up Appointments: Return Appointment in 1 week. - Dr. Lady Gary RM 2 Anesthetic: (In clinic) Topical Lidocaine 4% applied to wound bed Bathing/ Shower/ Hygiene: May shower with protection but do not get wound dressing(s) wet. Protect dressing(s) with water repellant cover (for example, large plastic bag) or a cast cover and may then take shower. Edema  Control - Orders / Instructions: Elevate legs to the level of the heart or above for 30 minutes daily and/or when sitting for 3-4 times a day throughout the day. Avoid standing for long periods of time. Exercise regularly WOUND #8: - Lower Leg Wound Laterality: Right, Anterior, Proximal Cleanser: Soap and Water 1 x Per Week/30 Days Discharge Instructions: May shower and wash wound with dial antibacterial soap and water prior to dressing change. Cleanser: Wound Cleanser 1 x Per Week/30 Days Discharge Instructions: Cleanse the wound with wound cleanser prior to applying a clean dressing using gauze sponges, not tissue or cotton balls. Peri-Wound Care: Sween Lotion (Moisturizing lotion) 1 x Per Week/30 Days Discharge Instructions: Apply moisturizing lotion as directed Prim Dressing: Maxorb Extra Ag+ Alginate Dressing, 2x2 (in/in) 1 x Per Week/30 Days ary Discharge Instructions: Apply  to wound bed as instructed Secondary Dressing: ABD Pad, 8x10 1 x Per Week/30 Days [name], [name] [name] ([id number]) [phone].pdf Page 6 of 7 Discharge Instructions: Apply over primary dressing as directed. Com pression Wrap: Kerlix Roll 4.5x3.1 (in/yd) 1 x Per Week/30 Days Discharge Instructions: Apply Kerlix and Coban compression as directed. Com pression Wrap: Coban Self-Adherent Wrap 4x5 (in/yd) 1 x Per Week/30 Days Discharge Instructions: Apply over Kerlix as directed. WOUND #9: - Lower Leg Wound Laterality: Right, Anterior, Distal Cleanser: Soap and Water 1 x Per Week/30 Days Discharge Instructions: May shower and wash wound with dial antibacterial soap and water prior to dressing change. Cleanser: Wound Cleanser 1 x Per Week/30 Days Discharge Instructions: Cleanse the wound with wound cleanser prior to applying a clean dressing using gauze sponges, not tissue or cotton balls. Peri-Wound Care: Sween Lotion (Moisturizing lotion) 1 x Per Week/30 Days Discharge Instructions: Apply  moisturizing lotion as directed Prim Dressing: Maxorb Extra Ag+ Alginate Dressing, 2x2 (in/in) 1 x Per Week/30 Days ary Discharge Instructions: Apply to wound bed as instructed Secondary Dressing: ABD Pad, 8x10 1 x Per Week/30 Days Discharge Instructions: Apply over primary dressing as directed. Com pression Wrap: Kerlix Roll 4.5x3.1 (in/yd) 1 x Per Week/30 Days Discharge Instructions: Apply Kerlix and Coban compression as directed. Com pression Wrap: Coban Self-Adherent Wrap 4x5 (in/yd) 1 x Per Week/30 Days Discharge Instructions: Apply over Kerlix as directed. 1. Nurse visit was transitioned into a doctor's visit 2 changed her from silver alginate to silver collagen ABD 3. The second wound which is more distal we continued with the silver alginate 4 still under Kerlix and Coban compression Electronic Signature(s) Signed: 06/01/2023 9:47:40 AM By: Baltazar Najjar MD Entered By: Baltazar Najjar on 05/29/2023 07:52:33 -------------------------------------------------------------------------------- SuperBill Details Patient Name: Date of Service: [name] 05/27/2023 Medical Record Number: [id number] Patient Account Number: [id number] Date of Birth/Sex: Treating RN: June 28, 1955 (67 y.o. [name], [name] Primary Care Provider: Alfredo Martinez Other Clinician: Referring Provider: Treating Provider/Extender: Arnette Schaumann, [name] in Treatment: 3 Diagnosis Coding ICD-10 Codes Code Description [phone] Non-pressure chronic ulcer of other part of right lower leg with fat layer exposed E11.622 Type 2 diabetes mellitus with other skin ulcer R60.0 Localized edema Facility Procedures : 7 CPT4 Code: 6606301 Description: 97597 - DEBRIDE WOUND 1ST 20 SQ CM OR < ICD-10 Diagnosis Description L97.812 Non-pressure chronic ulcer of other part of right lower leg with fat layer expos Modifier: ed Quantity: 1 Physician Procedures : CPT4 Code Description Modifier [id number] -  WC PHYS DEBR WO ANESTH 20 SQ CM ICD-10 Diagnosis Description L97.812 Non-pressure chronic ulcer of other part of right lower leg with fat layer exposed Quantity: 1 Electronic Signature(s) Signed: 06/01/2023 9:47:40 AM By: Baltazar Najjar MD Previous Signature: 05/28/2023 8:16:35 AM Version By: Fonnie Mu RN [name], [name] ([id number]) [id number] Page 7 of 7 Entered By: Baltazar Najjar on 05/29/2023 07:52:43

## 2023-06-04 ENCOUNTER — Encounter (HOSPITAL_BASED_OUTPATIENT_CLINIC_OR_DEPARTMENT_OTHER): Payer: Medicare Other | Admitting: Internal Medicine

## 2023-06-04 DIAGNOSIS — R600 Localized edema: Secondary | ICD-10-CM | POA: Diagnosis not present

## 2023-06-04 DIAGNOSIS — L97812 Non-pressure chronic ulcer of other part of right lower leg with fat layer exposed: Secondary | ICD-10-CM | POA: Diagnosis not present

## 2023-06-04 DIAGNOSIS — E11622 Type 2 diabetes mellitus with other skin ulcer: Secondary | ICD-10-CM | POA: Diagnosis not present

## 2023-06-04 DIAGNOSIS — L97815 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 without evidence of necrosis: Secondary | ICD-10-CM | POA: Diagnosis not present

## 2023-06-04 LAB — ZINC: Zinc: 68 ug/dL (ref 44–115)

## 2023-06-04 LAB — IRON,TIBC AND FERRITIN PANEL
Ferritin: 138 ng/mL (ref 15–150)
Iron Saturation: 9 % — CL (ref 15–55)
Iron: 24 ug/dL — ABNORMAL LOW (ref 27–139)
Total Iron Binding Capacity: 275 ug/dL (ref 250–450)
UIBC: 251 ug/dL (ref 118–369)

## 2023-06-04 LAB — VITAMIN B1: Thiamine: 123.8 nmol/L (ref 66.5–200.0)

## 2023-06-04 LAB — FOLATE: Folate: 7.5 ng/mL (ref >3.0)

## 2023-06-04 LAB — VITAMIN B6: Vitamin B6: 3.1 ug/L — ABNORMAL LOW (ref 3.4–65.2)

## 2023-06-05 NOTE — Progress Notes (Signed)
[name], [name] ([id number]) [id number] Page 1 of 1 Visit Report for 06/04/2023 SuperBill Details Patient Name: Date of Service: [name], [name] 06/04/2023 Medical Record Number: [id number] Patient Account Number: [id number] Date of Birth/Sex: Treating RN: [birth year] (67 y.o. F) Primary Care Provider: Alfredo [name] Other Clinician: Referring Provider: Treating Provider/Extender: Arnette Schaumann, [name] in Treatment: 4 Diagnosis Coding ICD-10 Codes Code Description [phone] Non-pressure chronic ulcer of other part of right lower leg with fat layer exposed E11.622 Type 2 diabetes mellitus with other skin ulcer R60.0 Localized edema Facility Procedures CPT4 Code Description Modifier Quantity [phone] - WOUND CARE VISIT-LEV 2 EST PT 1 Electronic Signature(s) Signed: 06/04/2023 5:06:36 PM By: Thayer Dallas Signed: 06/04/2023 5:18:28 PM By: Baltazar Najjar MD Entered By: Thayer Dallas on 06/04/2023 09:01:22

## 2023-06-05 NOTE — Progress Notes (Signed)
[name] ([id number]) [id number] Page 1 of 6 Visit Report for 06/04/2023 Arrival Information Details Patient Name: Date of Service: [name], SALMON 06/04/2023 9:15 A M Medical Record Number: [id number] Patient Account Number: [id number] Date of Birth/Sex: Treating RN: September 13, 1955 (67 y.o. F) Primary Care Mardy Hoppe: Alfredo Martinez Other Clinician: Thayer Dallas Referring Asharia Lotter: Treating Phyllip Claw/Extender: Arnette Schaumann, Jake Samples in Treatment: 4 Visit Information History Since Last Visit Added or deleted any medications: No Patient Arrived: Ambulatory Any new allergies or adverse reactions: No Arrival Time: 09:30 Had a fall or experienced change in No Accompanied By: self activities of daily living that may affect Transfer Assistance: None risk of falls: Patient Identification Verified: Yes Signs or symptoms of abuse/neglect since last visito No Secondary Verification Process Completed: Yes Hospitalized since last visit: No Patient Has Alerts: Yes Implantable device outside of the clinic excluding No Patient Alerts: Patient on Blood Thinner cellular tissue based products placed in the center since last visit: Has Dressing in Place as Prescribed: Yes Has Compression in Place as Prescribed: Yes Pain Present Now: No Electronic Signature(s) Signed: 06/04/2023 5:06:36 PM By: Thayer Dallas Entered By: Thayer Dallas on 06/04/2023 08:59:06 -------------------------------------------------------------------------------- Clinic Level of Care Assessment Details Patient Name: Date of Service: [name], [name] 06/04/2023 9:15 A M Medical Record Number: [id number] Patient Account Number: [id number] Date of Birth/Sex: Treating RN: [birth year] (67 y.o. F) Primary Care Elon Lomeli: Alfredo Martinez Other Clinician: Thayer Dallas Referring Gil Ingwersen: Treating Ivon Oelkers/Extender: Arnette Schaumann, [name] in Treatment: 4 Clinic Level of  Care Assessment Items TOOL 4 Quantity Score X- 1 0 Use when only an EandM is performed on FOLLOW-UP visit ASSESSMENTS - Nursing Assessment / Reassessment X- 1 10 Reassessment of Co-morbidities (includes updates in patient status) X- 1 5 Reassessment of Adherence to Treatment Plan ASSESSMENTS - Wound and Skin A ssessment / Reassessment []  - 0 Simple Wound Assessment / Reassessment - one wound []  - 0 Complex Wound Assessment / Reassessment - multiple wounds []  - 0 Dermatologic / Skin Assessment (not related to wound area) ASSESSMENTS - Focused Assessment []  - 0 Circumferential Edema Measurements - multi extremities []  - 0 Nutritional Assessment / Counseling / Intervention MRYTLE, [name] ([id number]) [id number] Page 2 of 6 []  - 0 Lower Extremity Assessment (monofilament, tuning fork, pulses) []  - 0 Peripheral Arterial Disease Assessment (using hand held doppler) ASSESSMENTS - Ostomy and/or Continence Assessment and Care []  - 0 Incontinence Assessment and Management []  - 0 Ostomy Care Assessment and Management (repouching, etc.) PROCESS - Coordination of Care X - Simple Patient / Family Education for ongoing care 1 15 []  - 0 Complex (extensive) Patient / Family Education for ongoing care []  - 0 Staff obtains Chiropractor, Records, T Results / Process Orders est []  - 0 Staff telephones HHA, Nursing Homes / Clarify orders / etc []  - 0 Routine Transfer to another Facility (non-emergent condition) []  - 0 Routine Hospital Admission (non-emergent condition) []  - 0 New Admissions / Manufacturing engineer / Ordering NPWT Apligraf, etc. , []  - 0 Emergency Hospital Admission (emergent condition) X- 1 10 Simple Discharge Coordination []  - 0 Complex (extensive) Discharge Coordination PROCESS - Special Needs []  - 0 Pediatric / Minor Patient Management []  - 0 Isolation Patient Management []  - 0 Hearing / Language / Visual special needs []  -  0 Assessment of Community assistance (transportation, D/C planning, etc.) []  - 0 Additional assistance / Altered mentation []  - 0 Support Surface(s) Assessment (bed, cushion, seat, etc.) INTERVENTIONS - Wound Cleansing / Measurement []  -  0 Simple Wound Cleansing - one wound X- 2 5 Complex Wound Cleansing - multiple wounds []  - 0 Wound Imaging (photographs - any number of wounds) []  - 0 Wound Tracing (instead of photographs) []  - 0 Simple Wound Measurement - one wound []  - 0 Complex Wound Measurement - multiple wounds INTERVENTIONS - Wound Dressings X - Small Wound Dressing one or multiple wounds 2 10 []  - 0 Medium Wound Dressing one or multiple wounds []  - 0 Large Wound Dressing one or multiple wounds []  - 0 Application of Medications - topical []  - 0 Application of Medications - injection INTERVENTIONS - Miscellaneous []  - 0 External ear exam []  - 0 Specimen Collection (cultures, biopsies, blood, body fluids, etc.) []  - 0 Specimen(s) / Culture(s) sent or taken to Lab for analysis []  - 0 Patient Transfer (multiple staff / Nurse, adult / Similar devices) []  - 0 Simple Staple / Suture removal (25 or less) []  - 0 Complex Staple / Suture removal (26 or more) []  - 0 Hypo / Hyperglycemic Management (close monitor of Blood Glucose) [name], [name] L ([id number]) [id number] Page 3 of 6 []  - 0 Ankle / Brachial Index (ABI) - do not check if billed separately []  - 0 Vital Signs Has the patient been seen at the hospital within the last three years: Yes Total Score: 70 Level Of Care: New/Established - Level 2 Electronic Signature(s) Signed: 06/04/2023 5:06:36 PM By: Thayer Dallas Entered By: Thayer Dallas on 06/04/2023 09:00:36 -------------------------------------------------------------------------------- Encounter Discharge Information Details Patient Name: Date of Service: [name] 06/04/2023 9:15 A M Medical Record Number:  [id number] Patient Account Number: [id number] Date of Birth/Sex: Treating RN: 02-05-1956 (67 y.o. F) Primary Care [name]: Alfredo Martinez Other Clinician: Thayer Dallas Referring Annalis Kaczmarczyk: Treating [name]/Extender: Arnette Schaumann, Jake Samples in Treatment: 4 Encounter Discharge Information Items Discharge Condition: Stable Ambulatory Status: Ambulatory Discharge Destination: Home Transportation: Private Auto Accompanied By: self Schedule Follow-up Appointment: Yes Clinical Summary of Care: Electronic Signature(s) Signed: 06/04/2023 5:06:36 PM By: Thayer Dallas Entered By: Thayer Dallas on 06/04/2023 09:01:14 -------------------------------------------------------------------------------- Patient/Caregiver Education Details Patient Name: Date of Service: [name] 12/11/2024andnbsp9:15 A M Medical Record Number: [id number] Patient Account Number: [id number] Date of Birth/Gender: Treating RN: [birth year] (67 y.o. F) Primary Care Physician: Alfredo Martinez Other Clinician: Thayer Dallas Referring Physician: Treating Physician/Extender: Hope Budds in Treatment: 4 Education Assessment Education Provided To: Patient Education Topics Provided Electronic Signature(s) Signed: 06/04/2023 5:06:36 PM By: Thayer Dallas Entered By: Thayer Dallas on 06/04/2023 09:00:59 [name] ([id number]) [id number] Page 4 of 6 -------------------------------------------------------------------------------- Wound Assessment Details Patient Name: Date of Service: AMARIANA, CORTAZAR 06/04/2023 9:15 A M Medical Record Number: [id number] Patient Account Number: [id number] Date of Birth/Sex: Treating RN: [birth year] (67 y.o. F) Primary Care Jayke Caul: Alfredo Martinez Other Clinician: Referring Sylvio Weatherall: Treating Lawrencia Mauney/Extender: Arnette Schaumann, [name] in Treatment: 4 Wound Status Wound Number: 8 Primary Etiology:  Venous Leg Ulcer Wound Location: Right, Proximal, Anterior Lower Leg Wound Status: Open Wounding Event: Gradually Appeared Date Acquired: 03/27/2023 Weeks Of Treatment: 4 Clustered Wound: No Wound Measurements Length: (cm) 0.5 Width: (cm) 0.7 Depth: (cm) 0.1 Area: (cm) 0.275 Volume: (cm) 0.027 % Reduction in Area: 65% % Reduction in Volume: 65.8% Wound Description Classification: Full Thickness Without Exposed Suppor Exudate Amount: Medium Exudate Type: Serosanguineous Exudate Color: red, brown t Structures Periwound Skin Texture Texture Color No Abnormalities Noted: No No Abnormalities Noted: No Moisture No Abnormalities Noted: No Treatment Notes Wound #8 (Lower Leg) Wound Laterality: Right,  Anterior, Proximal Cleanser Soap and Water Discharge Instruction: May shower and wash wound with dial antibacterial soap and water prior to dressing change. Wound Cleanser Discharge Instruction: Cleanse the wound with wound cleanser prior to applying a clean dressing using gauze sponges, not tissue or cotton balls. Peri-Wound Care Sween Lotion (Moisturizing lotion) Discharge Instruction: Apply moisturizing lotion as directed Topical Primary Dressing Maxorb Extra Ag+ Alginate Dressing, 2x2 (in/in) Discharge Instruction: Apply to wound bed as instructed Secondary Dressing ABD Pad, 8x10 Discharge Instruction: Apply over primary dressing as directed. Secured With Compression Wrap Kerlix Roll 4.5x3.1 (in/yd) Discharge Instruction: Apply Kerlix and Coban compression as directed. Coban Self-Adherent Wrap 4x5 (in/yd) Alberg, Saryiah L [id number] Page 5 of 6 Discharge Instruction: Apply over Kerlix as directed. Compression Stockings Add-Ons Electronic Signature(s) Signed: 06/04/2023 5:06:36 PM By: Thayer Dallas Entered By: Thayer Dallas on 06/04/2023  08:59:31 -------------------------------------------------------------------------------- Wound Assessment Details Patient Name: Date of Service: ERCELLE, CONTESSA 06/04/2023 9:15 A M Medical Record Number: [id number] Patient Account Number: [id number] Date of Birth/Sex: Treating RN: [birth year] (67 y.o. F) Primary Care Lenya Sterne: Alfredo Martinez Other Clinician: Referring Teairra Millar: Treating Tola Meas/Extender: Arnette Schaumann, [name] in Treatment: 4 Wound Status Wound Number: 9 Primary Etiology: Venous Leg Ulcer Wound Location: Right, Distal, Anterior Lower Leg Secondary Etiology: Diabetic Wound/Ulcer of the Lower Extremity Wounding Event: Trauma Wound Status: Open Date Acquired: 04/10/2023 Weeks Of Treatment: 4 Clustered Wound: No Wound Measurements Length: (cm) 2.5 Width: (cm) 2 Depth: (cm) 0.1 Area: (cm) 3.927 Volume: (cm) 0.393 % Reduction in Area: -20.8% % Reduction in Volume: -20.9% Wound Description Classification: Full Thickness Without Exposed Suppor Exudate Amount: Medium Exudate Type: Sanguinous Exudate Color: red t Structures Periwound Skin Texture Texture Color No Abnormalities Noted: No No Abnormalities Noted: No Moisture No Abnormalities Noted: No Treatment Notes Wound #9 (Lower Leg) Wound Laterality: Right, Anterior, Distal Cleanser Soap and Water Discharge Instruction: May shower and wash wound with dial antibacterial soap and water prior to dressing change. Wound Cleanser Discharge Instruction: Cleanse the wound with wound cleanser prior to applying a clean dressing using gauze sponges, not tissue or cotton balls. Peri-Wound Care Sween Lotion (Moisturizing lotion) Discharge Instruction: Apply moisturizing lotion as directed Topical Primary Dressing Maxorb Extra Ag+ Alginate Dressing, 2x2 (in/in) Discharge Instruction: Apply to wound bed as instructed RAWDA, BENALLY ([id number]) [id number] Page 6 of  6 Secondary Dressing ABD Pad, 8x10 Discharge Instruction: Apply over primary dressing as directed. Secured With Compression Wrap Kerlix Roll 4.5x3.1 (in/yd) Discharge Instruction: Apply Kerlix and Coban compression as directed. Coban Self-Adherent Wrap 4x5 (in/yd) Discharge Instruction: Apply over Kerlix as directed. Compression Stockings Add-Ons Electronic Signature(s) Signed: 06/04/2023 5:06:36 PM By: Thayer Dallas Entered By: Thayer Dallas on 06/04/2023 08:59:31 -------------------------------------------------------------------------------- Vitals Details Patient Name: Date of Service: [name]. 06/04/2023 9:15 A M Medical Record Number: [id number] Patient Account Number: [id number] Date of Birth/Sex: Treating RN: August 20, 1955 (67 y.o. F) Primary Care Tani Virgo: Alfredo Martinez Other Clinician: Thayer Dallas Referring Frady Taddeo: Treating Lexa Coronado/Extender: Arnette Schaumann, [name] in Treatment: 4 Vital Signs Time Taken: 09:45 Reference Range: 80 - 120 mg / dl Height (in): 70 Weight (lbs): 166 Body Mass Index (BMI): 23.8 Electronic Signature(s) Signed: 06/04/2023 5:06:36 PM By: Thayer Dallas Entered By: Thayer Dallas on 06/04/2023 08:59:17

## 2023-06-10 ENCOUNTER — Telehealth: Payer: Self-pay | Admitting: Family Medicine

## 2023-06-10 NOTE — Telephone Encounter (Signed)
Discussed low Vitamin b6 level. Recommended one Vitmain B6 50 mg daily  Recommended reducing Vit b12 from 3000 mg to 1000 mg daily.  Obtain OTC for both.

## 2023-06-13 ENCOUNTER — Encounter (HOSPITAL_BASED_OUTPATIENT_CLINIC_OR_DEPARTMENT_OTHER): Payer: Medicare Other | Admitting: General Surgery

## 2023-06-13 ENCOUNTER — Ambulatory Visit (HOSPITAL_BASED_OUTPATIENT_CLINIC_OR_DEPARTMENT_OTHER): Payer: Medicare Other | Admitting: General Surgery

## 2023-06-13 DIAGNOSIS — E11621 Type 2 diabetes mellitus with foot ulcer: Secondary | ICD-10-CM | POA: Diagnosis not present

## 2023-06-13 DIAGNOSIS — E11622 Type 2 diabetes mellitus with other skin ulcer: Secondary | ICD-10-CM | POA: Diagnosis not present

## 2023-06-13 DIAGNOSIS — L97815 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 without evidence of necrosis: Secondary | ICD-10-CM | POA: Diagnosis not present

## 2023-06-13 DIAGNOSIS — I872 Venous insufficiency (chronic) (peripheral): Secondary | ICD-10-CM | POA: Diagnosis not present

## 2023-06-13 DIAGNOSIS — L97813 Non-pressure chronic ulcer of other part of right lower leg with necrosis of muscle: Secondary | ICD-10-CM | POA: Diagnosis not present

## 2023-06-13 DIAGNOSIS — R600 Localized edema: Secondary | ICD-10-CM | POA: Diagnosis not present

## 2023-06-13 DIAGNOSIS — L97512 Non-pressure chronic ulcer of other part of right foot with fat layer exposed: Secondary | ICD-10-CM | POA: Diagnosis not present

## 2023-06-13 DIAGNOSIS — L97812 Non-pressure chronic ulcer of other part of right lower leg with fat layer exposed: Secondary | ICD-10-CM | POA: Diagnosis not present

## 2023-06-14 NOTE — Progress Notes (Signed)
LILLER, ASFOUR ([id number]) [id number] Page 1 of 10 Visit Report for 06/13/2023 Chief Complaint Document Details Patient Name: Date of Service: GENIYA, WILKENS 06/13/2023 11:00 A M Medical Record Number: [id number] Patient Account Number: [id number] Date of Birth/Sex: Treating RN: 12-21-1955 (67 y.o. F) Primary Care Provider: Alfredo Martinez Other Clinician: Referring Provider: Treating Provider/Extender: Dan Europe, [name] in Treatment: 6 Information Obtained from: Patient Chief Complaint Patient presents for treatment of an open ulcer due to venous insufficiency Electronic Signature(s) Signed: 06/13/2023 12:31:26 PM By: Duanne Guess MD FACS Entered By: Duanne Guess on 06/13/2023 09:18:02 -------------------------------------------------------------------------------- Debridement Details Patient Name: Date of Service: [name]. 06/13/2023 11:00 A M Medical Record Number: [id number] Patient Account Number: [id number] Date of Birth/Sex: Treating RN: 1955/08/09 (67 y.o. Billy Coast, [name] Primary Care Provider: Alfredo Martinez Other Clinician: Referring Provider: Treating Provider/Extender: Dan Europe, [name] in Treatment: 6 Debridement Performed for Assessment: Wound #8 Right,Proximal,Anterior Lower Leg Performed By: Physician Duanne Guess, MD The following information was scribed by: Zenaida Deed The information was scribed for: Duanne Guess Debridement Type: Debridement Severity of Tissue Pre Debridement: Fat layer exposed Level of Consciousness (Pre-procedure): Awake and Alert Pre-procedure Verification/Time Out Yes - 11:55 Taken: Start Time: 11:56 Pain Control: Lidocaine 4% T opical Solution Percent of Wound Bed Debrided: 100% T Area Debrided (cm): otal 0.14 Tissue and other material debrided: Viable, Non-Viable, Eschar, Slough, Subcutaneous, Slough Level: Skin/Subcutaneous  Tissue Debridement Description: Excisional Instrument: Curette Bleeding: Minimum Hemostasis Achieved: Pressure Procedural Pain: 2 Post Procedural Pain: 1 Response to Treatment: Procedure was tolerated well Level of Consciousness (Post- Awake and Alert procedure): Post Debridement Measurements of Total Wound Length: (cm) 0.3 Width: (cm) 0.6 Depth: (cm) 0.1 Volume: (cm) 0.014 Iannelli, Suraiya L ([id number]) [id number] Page 2 of 10 Character of Wound/Ulcer Post Debridement: Improved Severity of Tissue Post Debridement: Fat layer exposed Post Procedure Diagnosis Same as Pre-procedure Electronic Signature(s) Signed: 06/13/2023 12:31:26 PM By: Duanne Guess MD FACS Signed: 06/13/2023 12:52:17 PM By: Zenaida Deed RN, BSN Entered By: Zenaida Deed on 06/13/2023 09:01:06 -------------------------------------------------------------------------------- Debridement Details Patient Name: Date of Service: [name]. 06/13/2023 11:00 A M Medical Record Number: [id number] Patient Account Number: [id number] Date of Birth/Sex: Treating RN: 03-08-1956 (67 y.o. [name] Primary Care Provider: Alfredo Martinez Other Clinician: Referring Provider: Treating Provider/Extender: Dan Europe, [name] in Treatment: 6 Debridement Performed for Assessment: Wound #9 Right,Distal,Anterior Lower Leg Performed By: Physician Duanne Guess, MD The following information was scribed by: Zenaida Deed The information was scribed for: Duanne Guess Debridement Type: Debridement Severity of Tissue Pre Debridement: Fat layer exposed Level of Consciousness (Pre-procedure): Awake and Alert Pre-procedure Verification/Time Out Yes - 11:55 Taken: Start Time: 11:56 Pain Control: Lidocaine 4% T opical Solution Percent of Wound Bed Debrided: 100% T Area Debrided (cm): otal 1.22 Tissue and other material debrided: Viable, Non-Viable, Eschar, Slough,  Subcutaneous, Tendon, Slough Level: Skin/Subcutaneous Tissue/Muscle Debridement Description: Excisional Instrument: Curette Bleeding: Minimum Hemostasis Achieved: Pressure Procedural Pain: 2 Post Procedural Pain: 1 Response to Treatment: Procedure was tolerated well Level of Consciousness (Post- Awake and Alert procedure): Post Debridement Measurements of Total Wound Length: (cm) 1.3 Width: (cm) 1.2 Depth: (cm) 0.1 Volume: (cm) 0.123 Character of Wound/Ulcer Post Debridement: Improved Severity of Tissue Post Debridement: Necrosis of muscle Post Procedure Diagnosis Same as Pre-procedure Electronic Signature(s) Signed: 06/13/2023 12:31:26 PM By: Duanne Guess MD FACS Signed: 06/13/2023 12:52:17 PM By: Zenaida Deed RN, BSN Entered By: Zenaida Deed on 06/13/2023 09:04:20 Dowling, Yarelly L ([id number]) [id number]  Page 3 of 10 -------------------------------------------------------------------------------- Debridement Details Patient Name: Date of Service: MILISA, LUU 06/13/2023 11:00 A M Medical Record Number: [id number] Patient Account Number: [id number] Date of Birth/Sex: Treating RN: [birth year] (67 y.o. Billy Coast, [name] Primary Care Provider: Alfredo Martinez Other Clinician: Referring Provider: Treating Provider/Extender: Dan Europe, [name] in Treatment: 6 Debridement Performed for Assessment: Wound #10 Right,Lateral Foot Performed By: Physician Duanne Guess, MD Debridement Type: Debridement Severity of Tissue Pre Debridement: Fat layer exposed Level of Consciousness (Pre-procedure): Awake and Alert Pre-procedure Verification/Time Out Yes - 11:55 Taken: Start Time: 11:56 Pain Control: Lidocaine 4% T opical Solution Percent of Wound Bed Debrided: 100% T Area Debrided (cm): otal 0.44 Tissue and other material debrided: Viable, Non-Viable, Eschar, Subcutaneous, Skin: Epidermis Level: Skin/Subcutaneous  Tissue Debridement Description: Excisional Instrument: Curette, Forceps, Scissors Bleeding: Minimum Hemostasis Achieved: Pressure Procedural Pain: 2 Post Procedural Pain: 1 Response to Treatment: Procedure was tolerated well Level of Consciousness (Post- Awake and Alert procedure): Post Debridement Measurements of Total Wound Length: (cm) 0.7 Width: (cm) 0.8 Depth: (cm) 0.1 Volume: (cm) 0.044 Character of Wound/Ulcer Post Debridement: Improved Severity of Tissue Post Debridement: Fat layer exposed Post Procedure Diagnosis Same as Pre-procedure Electronic Signature(s) Signed: 06/13/2023 12:31:26 PM By: Duanne Guess MD FACS Signed: 06/13/2023 12:52:17 PM By: Zenaida Deed RN, BSN Entered By: Zenaida Deed on 06/13/2023 09:05:44 -------------------------------------------------------------------------------- HPI Details Patient Name: Date of Service: [name]. 06/13/2023 11:00 A M Medical Record Number: [id number] Patient Account Number: [id number] Date of Birth/Sex: Treating RN: Jan 12, 1956 (68 y.o. F) Primary Care Provider: Alfredo Martinez Other Clinician: Referring Provider: Treating Provider/Extender: Dan Europe, [name] in Treatment: 6 History of Present Illness HPI Description: ADMISSION 05/02/2023 KARISHA, CHAUDHARI ([id number]) [phone].pdf Page 4 of 10 ***ABIs R: 0.88, palpable pulses*** This is a 67 year old woman with well-controlled type 2 diabetes (last hemoglobin A1c 5.4%) presenting to the wound care center with two ulcers on her right lower leg. She saw her primary care provider earlier this week and per their note, she has had lower leg swelling and erythema for about 2 months. Her PCP prescribed Augmentin and doxycycline. 05/12/2023: Both wounds measured smaller today. There is some slough and eschar accumulation on each. She saw her PCP who removed our wraps for some reason and therefore her edema control is  not ideal. 05/20/2023: The proximal wound measured smaller today. The distal wound measured slightly larger, but this does incorporate a fair amount of scab/eschar. Edema control is much better this week. 12/3; the patient was in for a nurse visit to change her dressings today. I was asked to look at the wound which had a large dried eschar over the surface on the right anterior lower leg I suspect this is mostly dried blood from last week. 06/13/2023: The proximal ulcer is nearly healed. The distal ulcer has a large eschar over it with a much smaller underlying actual wound. T endon is exposed at the base of the wound. She also has a new wound on her right lateral foot. It looks as if it started out as a blister that subsequently ruptured. She is complaining of more neuropathic-type pain in both of her feet. Electronic Signature(s) Signed: 06/13/2023 12:31:26 PM By: Duanne Guess MD FACS Entered By: Duanne Guess on 06/13/2023 09:20:07 -------------------------------------------------------------------------------- Physical Exam Details Patient Name: Date of Service: ANETRIA, CONDRA 06/13/2023 11:00 A M Medical Record Number: [id number] Patient Account Number: [id number] Date of Birth/Sex: Treating RN: 10/24/1955 (67 y.o. F) Primary Care Provider: Alfredo Martinez  Other Clinician: Referring Provider: Treating Provider/Extender: Dan Europe, [name] in Treatment: 6 Constitutional . . . . no acute distress. Respiratory Normal work of breathing on room air.. Notes 06/13/2023: The proximal ulcer is nearly healed. The distal ulcer has a large eschar over it with a much smaller underlying actual wound. T endon is exposed at the base of the wound. She also has a new wound on her right lateral foot. It looks as if it started out as a blister that subsequently ruptured. Electronic Signature(s) Signed: 06/13/2023 12:31:26 PM By: Duanne Guess MD FACS Entered By: Duanne Guess on 06/13/2023 09:24:13 -------------------------------------------------------------------------------- Physician Orders Details Patient Name: Date of Service: ROGAN, NOA 06/13/2023 11:00 A M Medical Record Number: [id number] Patient Account Number: [id number] Date of Birth/Sex: Treating RN: 05/03/1956 (67 y.o. [name] Primary Care Provider: Alfredo Martinez Other Clinician: Referring Provider: Treating Provider/Extender: Dan Europe, [name] in Treatment: 6 The following information was scribed by: Zenaida Deed The information was scribed for: Duanne Guess Verbal / Phone Orders: No Diagnosis Coding ICD-10 Coding MERRILY, KLAMMER ([id number]) [id number] Page 5 of 10 Code Description [phone] Non-pressure chronic ulcer of other part of right lower leg with fat layer exposed E11.622 Type 2 diabetes mellitus with other skin ulcer R60.0 Localized edema Follow-up Appointments ppointment in 1 week. - Dr. Lady Gary Return A Anesthetic (In clinic) Topical Lidocaine 4% applied to wound bed Bathing/ Shower/ Hygiene May shower with protection but do not get wound dressing(s) wet. Protect dressing(s) with water repellant cover (for example, large plastic bag) or a cast cover and may then take shower. Edema Control - Orders / Instructions Elevate legs to the level of the heart or above for 30 minutes daily and/or when sitting for 3-4 times a day throughout the day. Avoid standing for long periods of time. Exercise regularly Wound Treatment Wound #10 - Foot Wound Laterality: Right, Lateral Peri-Wound Care: Sween Lotion (Moisturizing lotion) 1 x Per Week Discharge Instructions: Apply moisturizing lotion as directed Prim Dressing: Promogran Prisma Matrix, 4.34 (sq in) (silver collagen) 1 x Per Week ary Discharge Instructions: Moisten collagen with saline or hydrogel Secondary Dressing: Optifoam Non-Adhesive Dressing, 4x4 in  1 x Per Week Discharge Instructions: Apply over primary dressing cut to make foam donut Secondary Dressing: Woven Gauze Sponges 2x2 in 1 x Per Week Discharge Instructions: Apply over primary dressing as directed. Secured With: 42M Medipore H Soft Cloth Surgical T ape, 4 x 10 (in/yd) 1 x Per Week Discharge Instructions: Secure with tape as directed. Wound #8 - Lower Leg Wound Laterality: Right, Anterior, Proximal Cleanser: Soap and Water 1 x Per Week/30 Days Discharge Instructions: May shower and wash wound with dial antibacterial soap and water prior to dressing change. Cleanser: Wound Cleanser 1 x Per Week/30 Days Discharge Instructions: Cleanse the wound with wound cleanser prior to applying a clean dressing using gauze sponges, not tissue or cotton balls. Peri-Wound Care: Sween Lotion (Moisturizing lotion) 1 x Per Week/30 Days Discharge Instructions: Apply moisturizing lotion as directed Prim Dressing: Promogran Prisma Matrix, 4.34 (sq in) (silver collagen) 1 x Per Week/30 Days ary Discharge Instructions: Moisten collagen with saline or hydrogel Secondary Dressing: ABD Pad, 8x10 1 x Per Week/30 Days Discharge Instructions: Apply over primary dressing as directed. Compression Wrap: Kerlix Roll 4.5x3.1 (in/yd) 1 x Per Week/30 Days Discharge Instructions: Apply Kerlix and Coban compression as directed. Compression Wrap: Coban Self-Adherent Wrap 4x5 (in/yd) 1 x Per Week/30 Days Discharge Instructions: Apply over Kerlix as directed.  Wound #9 - Lower Leg Wound Laterality: Right, Anterior, Distal Cleanser: Soap and Water 1 x Per Week/30 Days Discharge Instructions: May shower and wash wound with dial antibacterial soap and water prior to dressing change. Cleanser: Wound Cleanser 1 x Per Week/30 Days Discharge Instructions: Cleanse the wound with wound cleanser prior to applying a clean dressing using gauze sponges, not tissue or cotton balls. Peri-Wound Care: Sween Lotion (Moisturizing lotion)  1 x Per Week/30 Days Discharge Instructions: Apply moisturizing lotion as directed Prim Dressing: Promogran Prisma Matrix, 4.34 (sq in) (silver collagen) 1 x Per Week/30 Days ary Discharge Instructions: Moisten collagen with saline or hydrogel Secondary Dressing: ABD Pad, 8x10 1 x Per Week/30 Days Discharge Instructions: Apply over primary dressing as directed. Compression Wrap: Kerlix Roll 4.5x3.1 (in/yd) 1 x Per Week/30 Days Deal, Quintavia L ([id number]) [id number].pdf Page 6 of 10 Discharge Instructions: Apply Kerlix and Coban compression as directed. Compression Wrap: Coban Self-Adherent Wrap 4x5 (in/yd) 1 x Per Week/30 Days Discharge Instructions: Apply over Kerlix as directed. Electronic Signature(s) Signed: 06/13/2023 12:31:26 PM By: Duanne Guess MD FACS Entered By: Duanne Guess on 06/13/2023 09:25:03 -------------------------------------------------------------------------------- Problem List Details Patient Name: Date of Service: KIANNAH, MENTH 06/13/2023 11:00 A M Medical Record Number: [id number] Patient Account Number: [id number] Date of Birth/Sex: Treating RN: Jan 12, 1956 (67 y.o. Billy Coast, [name] Primary Care Provider: Alfredo Martinez Other Clinician: Referring Provider: Treating Provider/Extender: Dan Europe, [name] in Treatment: 6 Active Problems ICD-10 Encounter Code Description Active Date MDM Diagnosis [phone] Non-pressure chronic ulcer of other part of right lower leg with fat layer 05/02/2023 No Yes exposed L97.815 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 06/13/2023 No Yes involvement without evidence of necrosis L97.512 Non-pressure chronic ulcer of other part of right foot with fat layer exposed 06/13/2023 No Yes E11.622 Type 2 diabetes mellitus with other skin ulcer 05/02/2023 No Yes R60.0 Localized edema 05/02/2023 No Yes Inactive Problems Resolved Problems Electronic  Signature(s) Signed: 06/13/2023 12:31:26 PM By: Duanne Guess MD FACS Entered By: Duanne Guess on 06/13/2023 09:28:32 Progress Note Details -------------------------------------------------------------------------------- [name] ([id number]) [id number] Page 7 of 10 Patient Name: Date of Service: HETTIE, GALLMEYER 06/13/2023 11:00 A M Medical Record Number: [id number] Patient Account Number: [id number] Date of Birth/Sex: Treating RN: Nov 04, 1955 (67 y.o. F) Primary Care Provider: Alfredo Martinez Other Clinician: Referring Provider: Treating Provider/Extender: Dan Europe, [name] in Treatment: 6 Subjective Chief Complaint Information obtained from Patient Patient presents for treatment of an open ulcer due to venous insufficiency History of Present Illness (HPI) ADMISSION 05/02/2023 ***ABIs R: 0.88, palpable pulses*** This is a 67 year old woman with well-controlled type 2 diabetes (last hemoglobin A1c 5.4%) presenting to the wound care center with two ulcers on her right lower leg. She saw her primary care provider earlier this week and per their note, she has had lower leg swelling and erythema for about 2 months. Her PCP prescribed Augmentin and doxycycline. 05/12/2023: Both wounds measured smaller today. There is some slough and eschar accumulation on each. She saw her PCP who removed our wraps for some reason and therefore her edema control is not ideal. 05/20/2023: The proximal wound measured smaller today. The distal wound measured slightly larger, but this does incorporate a fair amount of scab/eschar. Edema control is much better this week. 12/3; the patient was in for a nurse visit to change her dressings today. I was asked to look at the wound which had a large dried eschar over the surface on  the right anterior lower leg I suspect this is mostly dried blood from last week. 06/13/2023: The proximal ulcer is nearly healed.  The distal ulcer has a large eschar over it with a much smaller underlying actual wound. T endon is exposed at the base of the wound. She also has a new wound on her right lateral foot. It looks as if it started out as a blister that subsequently ruptured. She is complaining of more neuropathic-type pain in both of her feet. Objective Constitutional no acute distress. Vitals Time Taken: 11:18 AM, Height: 70 in, Weight: 166 lbs, BMI: 23.8, Temperature: 97.6 F, Pulse: 70 bpm, Respiratory Rate: 18 breaths/min, Blood Pressure: 130/61 mmHg. Respiratory Normal work of breathing on room air.. General Notes: 06/13/2023: The proximal ulcer is nearly healed. The distal ulcer has a large eschar over it with a much smaller underlying actual wound. T endon is exposed at the base of the wound. She also has a new wound on her right lateral foot. It looks as if it started out as a blister that subsequently ruptured. Integumentary (Hair, Skin) Wound #10 status is Open. Original cause of wound was Gradually Appeared. The date acquired was: 06/13/2023. The wound is located on the Right,Lateral Foot. The wound measures 0.7cm length x 0.8cm width x 0.1cm depth; 0.44cm^2 area and 0.044cm^3 volume. There is Fat Layer (Subcutaneous Tissue) exposed. There is no tunneling or undermining noted. There is a medium amount of serosanguineous drainage noted. The wound margin is fibrotic, thickened scar. There is large (67-100%) red granulation within the wound bed. There is no necrotic tissue within the wound bed. The periwound skin appearance had no abnormalities noted for moisture. The periwound skin appearance had no abnormalities noted for color. The periwound skin appearance exhibited: Scarring. Periwound temperature was noted as No Abnormality. Wound #8 status is Open. Original cause of wound was Gradually Appeared. The date acquired was: 03/27/2023. The wound has been in treatment 6 weeks. The wound is located on the  Right,Proximal,Anterior Lower Leg. The wound measures 0.3cm length x 0.6cm width x 0.1cm depth; 0.141cm^2 area and 0.014cm^3 volume. There is no tunneling or undermining noted. There is a medium amount of serosanguineous drainage noted. The wound margin is fibrotic, thickened scar. There is small (1-33%) red granulation within the wound bed. There is a large (67-100%) amount of necrotic tissue within the wound bed including Eschar and Adherent Slough. The periwound skin appearance had no abnormalities noted for texture. The periwound skin appearance had no abnormalities noted for moisture. The periwound skin appearance had no abnormalities noted for color. Periwound temperature was noted as No Abnormality. Wound #9 status is Open. Original cause of wound was Trauma. The date acquired was: 04/10/2023. The wound has been in treatment 6 weeks. The wound is located on the Right,Distal,Anterior Lower Leg. The wound measures 2.3cm length x 3.1cm width x 0.1cm depth; 5.6cm^2 area and 0.56cm^3 volume. There is Fat Layer (Subcutaneous Tissue) exposed. There is no tunneling or undermining noted. There is a medium amount of serosanguineous drainage noted. The wound margin is fibrotic, thickened scar. There is small (1-33%) red granulation within the wound bed. There is a large (67-100%) amount of necrotic tissue within the wound bed including Eschar and Adherent Slough. The periwound skin appearance had no abnormalities noted for texture. The periwound skin appearance had no abnormalities noted for moisture. The periwound skin appearance had no abnormalities noted for color. Periwound temperature was noted as No Abnormality. Assessment VALARY, CASAD ([id number]) [id number] Page  8 of 10 Active Problems ICD-10 Non-pressure chronic ulcer of other part of right lower leg with fat layer exposed 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 without  evidence of necrosis Non-pressure chronic ulcer of other part of right foot with fat layer exposed Type 2 diabetes mellitus with other skin ulcer Localized edema Procedures Wound #10 Pre-procedure diagnosis of Wound #10 is a Diabetic Wound/Ulcer of the Lower Extremity located on the Right,Lateral Foot .Severity of Tissue Pre Debridement is: Fat layer exposed. There was a Excisional Skin/Subcutaneous Tissue Debridement with a total area of 0.44 sq cm performed by Duanne Guess, MD. With the following instrument(s): Curette, Forceps, and Scissors to remove Viable and Non-Viable tissue/material. Material removed includes Eschar, Subcutaneous Tissue, and Skin: Epidermis after achieving pain control using Lidocaine 4% T opical Solution. No specimens were taken. A time out was conducted at 11:55, prior to the start of the procedure. A Minimum amount of bleeding was controlled with Pressure. The procedure was tolerated well with a pain level of 2 throughout and a pain level of 1 following the procedure. Post Debridement Measurements: 0.7cm length x 0.8cm width x 0.1cm depth; 0.044cm^3 volume. Character of Wound/Ulcer Post Debridement is improved. Severity of Tissue Post Debridement is: Fat layer exposed. Post procedure Diagnosis Wound #10: Same as Pre-Procedure Wound #8 Pre-procedure diagnosis of Wound #8 is a Venous Leg Ulcer located on the Right,Proximal,Anterior Lower Leg .Severity of Tissue Pre Debridement is: Fat layer exposed. There was a Excisional Skin/Subcutaneous Tissue Debridement with a total area of 0.14 sq cm performed by Duanne Guess, MD. With the following instrument(s): Curette to remove Viable and Non-Viable tissue/material. Material removed includes Eschar, Subcutaneous Tissue, and Slough after achieving pain control using Lidocaine 4% Topical Solution. No specimens were taken. A time out was conducted at 11:55, prior to the start of the procedure. A Minimum amount of bleeding  was controlled with Pressure. The procedure was tolerated well with a pain level of 2 throughout and a pain level of 1 following the procedure. Post Debridement Measurements: 0.3cm length x 0.6cm width x 0.1cm depth; 0.014cm^3 volume. Character of Wound/Ulcer Post Debridement is improved. Severity of Tissue Post Debridement is: Fat layer exposed. Post procedure Diagnosis Wound #8: Same as Pre-Procedure Wound #9 Pre-procedure diagnosis of Wound #9 is a Venous Leg Ulcer located on the Right,Distal,Anterior Lower Leg .Severity of Tissue Pre Debridement is: Fat layer exposed. There was a Excisional Skin/Subcutaneous Tissue/Muscle Debridement with a total area of 1.22 sq cm performed by Duanne Guess, MD. With the following instrument(s): Curette to remove Viable and Non-Viable tissue/material. Material removed includes Eschar, Tendon, Subcutaneous Tissue, and Slough after achieving pain control using Lidocaine 4% Topical Solution. No specimens were taken. A time out was conducted at 11:55, prior to the start of the procedure. A Minimum amount of bleeding was controlled with Pressure. The procedure was tolerated well with a pain level of 2 throughout and a pain level of 1 following the procedure. Post Debridement Measurements: 1.3cm length x 1.2cm width x 0.1cm depth; 0.123cm^3 volume. Character of Wound/Ulcer Post Debridement is improved. Severity of Tissue Post Debridement is: Necrosis of muscle. Post procedure Diagnosis Wound #9: Same as Pre-Procedure Plan Follow-up Appointments: Return Appointment in 1 week. - Dr. Lady Gary Anesthetic: (In clinic) Topical Lidocaine 4% applied to wound bed Bathing/ Shower/ Hygiene: May shower with protection but do not get wound dressing(s) wet. Protect dressing(s) with water repellant cover (for example, large plastic bag) or a cast  cover and may then take shower. Edema Control - Orders / Instructions: Elevate legs to the level of the heart or above for 30  minutes daily and/or when sitting for 3-4 times a day throughout the day. Avoid standing for long periods of time. Exercise regularly WOUND #10: - Foot Wound Laterality: Right, Lateral Peri-Wound Care: Sween Lotion (Moisturizing lotion) 1 x Per Week/ Discharge Instructions: Apply moisturizing lotion as directed Prim Dressing: Promogran Prisma Matrix, 4.34 (sq in) (silver collagen) 1 x Per Week/ ary Discharge Instructions: Moisten collagen with saline or hydrogel Secondary Dressing: Optifoam Non-Adhesive Dressing, 4x4 in 1 x Per Week/ Discharge Instructions: Apply over primary dressing cut to make foam donut Secondary Dressing: Woven Gauze Sponges 2x2 in 1 x Per Week/ Discharge Instructions: Apply over primary dressing as directed. Secured With: 24M Medipore H Soft Cloth Surgical T ape, 4 x 10 (in/yd) 1 x Per Week/ Discharge Instructions: Secure with tape as directed. WOUND #8: - Lower Leg Wound Laterality: Right, Anterior, Proximal Cleanser: Soap and Water 1 x Per Week/30 Days Discharge Instructions: May shower and wash wound with dial antibacterial soap and water prior to dressing change. Cleanser: Wound Cleanser 1 x Per Week/30 Days Discharge Instructions: Cleanse the wound with wound cleanser prior to applying a clean dressing using gauze sponges, not tissue or cotton balls. Peri-Wound Care: Sween Lotion (Moisturizing lotion) 1 x Per Week/30 Days Discharge Instructions: Apply moisturizing lotion as directed Prim Dressing: Promogran Prisma Matrix, 4.34 (sq in) (silver collagen) 1 x Per Week/30 Days ary Discharge Instructions: Moisten collagen with saline or hydrogel Secondary Dressing: ABD Pad, 8x10 1 x Per Week/30 Days Discharge Instructions: Apply over primary dressing as directed. Com pression Wrap: Kerlix Roll 4.5x3.1 (in/yd) 1 x Per Week/30 Days Discharge Instructions: Apply Kerlix and Coban compression as directed. BIRKLEY, VARAS ([id number]) [id number]  Page 9 of 10 Com pression Wrap: Coban Self-Adherent Wrap 4x5 (in/yd) 1 x Per Week/30 Days Discharge Instructions: Apply over Kerlix as directed. WOUND #9: - Lower Leg Wound Laterality: Right, Anterior, Distal Cleanser: Soap and Water 1 x Per Week/30 Days Discharge Instructions: May shower and wash wound with dial antibacterial soap and water prior to dressing change. Cleanser: Wound Cleanser 1 x Per Week/30 Days Discharge Instructions: Cleanse the wound with wound cleanser prior to applying a clean dressing using gauze sponges, not tissue or cotton balls. Peri-Wound Care: Sween Lotion (Moisturizing lotion) 1 x Per Week/30 Days Discharge Instructions: Apply moisturizing lotion as directed Prim Dressing: Promogran Prisma Matrix, 4.34 (sq in) (silver collagen) 1 x Per Week/30 Days ary Discharge Instructions: Moisten collagen with saline or hydrogel Secondary Dressing: ABD Pad, 8x10 1 x Per Week/30 Days Discharge Instructions: Apply over primary dressing as directed. Com pression Wrap: Kerlix Roll 4.5x3.1 (in/yd) 1 x Per Week/30 Days Discharge Instructions: Apply Kerlix and Coban compression as directed. Com pression Wrap: Coban Self-Adherent Wrap 4x5 (in/yd) 1 x Per Week/30 Days Discharge Instructions: Apply over Kerlix as directed. 06/13/2023: The proximal ulcer is nearly healed. The distal ulcer has a large eschar over it with a much smaller underlying actual wound. T endon is exposed at the base of the wound. She also has a new wound on her right lateral foot. It looks as if it started out as a blister that subsequently ruptured. She is complaining of more neuropathic-type pain in both of her feet. I used a combination of scissors, forceps, and curette to debride skin, eschar, biofilm, and subcutaneous tissue from the new ulcer on her right lateral foot.  I used a curette to debride eschar, slough, and subcutaneous tissue from the proximal anterior tibial ulcer. I debrided eschar, slough,  subcutaneous tissue, and tendon from the more distal of the anterior tibial ulcers. We will apply Prisma silver collagen to all of the sites and continue Kerlix and Coban compression. She does apparently take gabapentin for neuropathy. I suggested that she communicate with whomever is prescribing this medication to discuss her worsening symptoms and whether or not an adjustment in her dosage might be indicated. Follow-up in 1 week. Electronic Signature(s) Signed: 06/13/2023 12:31:26 PM By: Duanne Guess MD FACS Entered By: Duanne Guess on 06/13/2023 09:28:43 -------------------------------------------------------------------------------- SuperBill Details Patient Name: Date of Service: JESSICCA, FILIPPONE 06/13/2023 Medical Record Number: [id number] Patient Account Number: [id number] Date of Birth/Sex: Treating RN: 1956/02/25 (67 y.o. F) Primary Care Provider: Alfredo Martinez Other Clinician: Referring Provider: Treating Provider/Extender: Dan Europe, [name] in Treatment: 6 Diagnosis Coding ICD-10 Codes Code Description [phone] Non-pressure chronic ulcer of other part of right lower leg with fat layer exposed L97.815 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 without evidence of necrosis L97.512 Non-pressure chronic ulcer of other part of right foot with fat layer exposed E11.622 Type 2 diabetes mellitus with other skin ulcer R60.0 Localized edema Facility Procedures : 3 CPT4 Code: [id number] Description: 11042 - DEB SUBQ TISSUE 20 SQ CM/< ICD-10 Diagnosis Description L97.812 Non-pressure chronic ulcer of other part of right lower leg with fat layer exposed L97.512 Non-pressure chronic ulcer of other part of right foot with fat layer exposed Modifier: Quantity: 1 : 3 [place] CPT4 Code: [id number] BRA L ([id number] Description: 11043 - DEB MUSC/FASCIA 20 SQ CM/< ICD-10 Diagnosis Description L97.815 Non-pressure chronic ulcer of  other part of right lower leg with muscle involvement w 75) [id number] Modifier: ithout evidence of [id number] P Quantity: 1 necrosis age 66 of 54 Physician Procedures : CPT4 Code Description Modifier [phone] 99214 - WC PHYS LEVEL 4 - EST PT 25 ICD-10 Diagnosis Description L97.812 Non-pressure chronic ulcer of other part of right lower leg with fat layer exposed L97.512 Non-pressure chronic ulcer of other part of right  foot with fat layer exposed E11.622 Type 2 diabetes mellitus with other skin ulcer R60.0 Localized edema Quantity: 1 : [id number] 11042 - WC PHYS SUBQ TISS 20 SQ CM ICD-10 Diagnosis Description L97.812 Non-pressure chronic ulcer of other part of right lower leg with fat layer exposed L97.512 Non-pressure chronic ulcer of other part of right foot with fat layer exposed Quantity: 1 : [id number] 11043 - WC PHYS DEBR MUSCLE/FASCIA 20 SQ CM ICD-10 Diagnosis Description L97.815 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 without evidence o Quantity: 1 f necrosis Electronic Signature(s) Signed: 06/13/2023 12:31:26 PM By: Duanne Guess MD FACS Entered By: Duanne Guess on 06/13/2023 09:29:00

## 2023-06-15 ENCOUNTER — Encounter: Payer: Self-pay | Admitting: Family Medicine

## 2023-06-16 ENCOUNTER — Other Ambulatory Visit: Payer: Self-pay

## 2023-06-16 ENCOUNTER — Encounter: Payer: Self-pay | Admitting: Physical Therapy

## 2023-06-16 ENCOUNTER — Telehealth: Payer: Self-pay | Admitting: Physical Therapy

## 2023-06-16 ENCOUNTER — Ambulatory Visit: Payer: Medicare Other | Admitting: Physical Therapy

## 2023-06-16 VITALS — BP 114/44 | HR 72

## 2023-06-16 DIAGNOSIS — M6281 Muscle weakness (generalized): Secondary | ICD-10-CM

## 2023-06-16 DIAGNOSIS — R296 Repeated falls: Secondary | ICD-10-CM

## 2023-06-16 DIAGNOSIS — R2689 Other abnormalities of gait and mobility: Secondary | ICD-10-CM

## 2023-06-16 NOTE — Therapy (Signed)
[facility] [address] [place], [place], [address] Phone: [phone]   Fax:  [phone]  Patient Details  Name: [name] MRN: [id number] Date of Birth: [birth year] Referring Provider:  McDiarmid, Leighton Roach, MD  Encounter Date: 06/16/2023  Skilled PT session was arrive no charge. Patient reporting some episodes of dizziness and recurrent falls. BP assessed and 119/50 mmHg seated and 114/44 mmHg standing. Patient and daughter who arrives late to session reports significant memory changes. Patient as difficulty recalling date of birthday at start of session. Imaging showed no acute infarct when recently assessed. Recommend follow up with PCP for BP management and to call back to reschedule when stabilized. Also recommended speech therapy as anticipate that cognition is playing large roll in falls. Patient also reports recently received AFO but forgot to bring today. Recommend bringing next session.  Vitals:   06/16/23 0941 06/16/23 0942  BP: (!) 119/50 (!) 114/44  Pulse: 69 72     Carmelia Bake, PT, DPT 06/16/2023, 10:16 AM  [facility] [address] [place], [place], [address] Phone: [phone]   Fax:  [phone]

## 2023-06-16 NOTE — Progress Notes (Signed)
[name] ([id number]) [id number] Page 1 of 8 Visit Report for 06/13/2023 Arrival Information Details Patient Name: Date of Service: [name], [name] 06/13/2023 11:00 A M Medical Record Number: [id number] Patient Account Number: [id number] Date of Birth/Sex: Treating RN: [birth year] (67 y.o. F) Primary Care [name]: [name] Other Clinician: Referring [name]: Treating [name]/Extender: [name], [name] in Treatment: 6 Visit Information History Since Last Visit Added or deleted any medications: No Patient Arrived: Ambulatory Any new allergies or adverse reactions: No Arrival Time: 11:17 Had a fall or experienced change in No Accompanied By: self activities of daily living that may affect Transfer Assistance: None risk of falls: Patient Identification Verified: Yes Signs or symptoms of abuse/neglect since last visito No Secondary Verification Process Completed: Yes Hospitalized since last visit: No Patient Has Alerts: Yes Implantable device outside of the clinic excluding No Patient Alerts: Patient on Blood Thinner cellular tissue based products placed in the center since last visit: Has Dressing in Place as Prescribed: Yes Pain Present Now: No Electronic Signature(s) Signed: 06/16/2023 8:45:31 AM By: [name] Entered By: [name] on 06/13/2023 08:18:15 -------------------------------------------------------------------------------- Lower Extremity Assessment Details Patient Name: Date of Service: [name], [name] 06/13/2023 11:00 A M Medical Record Number: [id number] Patient Account Number: [id number] Date of Birth/Sex: Treating RN: 09-23-1955 (67 y.o. Katrinka Blazing Primary Care [name]: [name] Other Clinician: Referring [name]: Treating Immaculate Crutcher/Extender: [name], [name] in Treatment: 6 Edema Assessment Assessed: [Left: No] [Right: No] Edema: [Left: Ye]  [Right: s] Calf Left: Right: Point of Measurement: 36 cm From Medial Instep 34.5 cm Ankle Left: Right: Point of Measurement: 9 cm From Medial Instep 24 cm Vascular Assessment Pulses: Dorsalis Pedis Palpable: [Right:Yes] Extremity colors, hair growth, and conditions: [name] ([id number]) [Right:[id number] Page 2 of 8] Extremity Color: [Right:Red] Hair Growth on Extremity: [Right:No] Temperature of Extremity: [Right:Warm] Capillary Refill: [Right:< 3 seconds] Dependent Rubor: [Right:No No] Electronic Signature(s) Signed: 06/13/2023 12:14:37 PM By: [name] Schwalbe RN Entered By: [name] on 06/13/2023 08:49:33 -------------------------------------------------------------------------------- Multi Wound Chart Details Patient Name: Date of Service: [name] Gunning. 06/13/2023 11:00 A M Medical Record Number: [id number] Patient Account Number: [id number] Date of Birth/Sex: Treating RN: [birth year] (67 y.o. F) Primary Care [name]: [name] Other Clinician: Referring [name]: Treating [name]/Extender: [name], [name] in Treatment: 6 Vital Signs Height(in): 70 Pulse(bpm): 70 Weight(lbs): 166 Blood Pressure(mmHg): 130/61 Body Mass Index(BMI): 23.8 Temperature(F): 97.6 Respiratory Rate(breaths/min): 18 [10:Photos:] Right, Lateral Foot Right, Proximal, Anterior Lower Leg Right, Distal, Anterior Lower Leg Wound Location: Gradually Appeared Gradually Appeared Trauma Wounding Event: Diabetic Wound/Ulcer of the Lower Venous Leg Ulcer Venous Leg Ulcer Primary Etiology: Extremity N/A N/A Diabetic Wound/Ulcer of the Lower Secondary Etiology: Extremity Cataracts, Anemia, Sleep Apnea, Cataracts, Anemia, Sleep Apnea, Cataracts, Anemia, Sleep Apnea, Comorbid History: Arrhythmia, Coronary Artery Disease, Arrhythmia, Coronary Artery Disease, Arrhythmia, Coronary Artery Disease, Hypertension, Peripheral Venous  Hypertension, Peripheral Venous Hypertension, Peripheral Venous Disease, Type II Diabetes, Disease, Type II Diabetes, Disease, Type II Diabetes, Osteoarthritis, Neuropathy Osteoarthritis, Neuropathy Osteoarthritis, Neuropathy 06/13/2023 03/27/2023 04/10/2023 Date Acquired: 0 6 6 [name] of Treatment: Open Open Open Wound Status: No No No Wound Recurrence: 0.7x0.8x0.1 0.3x0.6x0.1 2.3x3.1x0.1 Measurements [name] x W x D (cm) 0.44 0.141 5.6 A (cm) : rea 0.044 0.014 0.56 Volume (cm) : N/A 82.00% -72.20% % Reduction in Area: N/A 82.30% -72.30% % Reduction in Volume: Grade 1 Full Thickness Without Exposed Full Thickness Without Exposed Classification: Support Structures Support Structures Medium Medium Medium Exudate A mount: Serosanguineous  Serosanguineous Serosanguineous Exudate Type: red, brown red, brown red, brown Exudate Color: Fibrotic scar, thickened scar Fibrotic scar, thickened scar Fibrotic scar, thickened scar Wound Margin: Large (67-100%) Small (1-33%) Small (1-33%) Granulation Amount: Red Red Red Granulation Quality: None Present (0%) Large (67-100%) Large (67-100%) Necrotic Amount: N/A Eschar, Adherent Slough Eschar, Adherent Slough Necrotic Tissue: Fat Layer (Subcutaneous Tissue): Yes Fascia: No Fat Layer (Subcutaneous Tissue): Yes Exposed Structures: Fascia: No Fat Layer (Subcutaneous Tissue): No Fascia: No [name] ([id number]) [id number] Page 3 of 8 Tendon: No Tendon: No Tendon: No Muscle: No Muscle: No Muscle: No Joint: No Joint: No Joint: No Bone: No Bone: No Bone: No Small (1-33%) Small (1-33%) Large (67-100%) Epithelialization: Debridement - Excisional Debridement - Excisional Debridement - Excisional Debridement: Pre-procedure Verification/Time Out 11:55 11:55 11:55 Taken: Lidocaine 4% Topical Solution Lidocaine 4% Topical Solution Lidocaine 4% Topical Solution Pain Control: Necrotic/Eschar, Subcutaneous  Necrotic/Eschar, Subcutaneous, Necrotic/Eschar, Tendon, Tissue Debrided: Slough Subcutaneous, Slough Skin/Subcutaneous Tissue Skin/Subcutaneous Tissue Skin/Subcutaneous Tissue/Muscle Level: 0.44 0.14 1.22 Debridement A (sq cm): rea Curette, Forceps, Scissors Curette Curette Instrument: Minimum Minimum Minimum Bleeding: Pressure Pressure Pressure Hemostasis A chieved: 2 2 2  Procedural Pain: 1 1 1  Post Procedural Pain: Procedure was tolerated well Procedure was tolerated well Procedure was tolerated well Debridement Treatment Response: 0.7x0.8x0.1 0.3x0.6x0.1 1.3x1.2x0.1 Post Debridement Measurements [name] x W x D (cm) 0.044 0.014 0.123 Post Debridement Volume: (cm) Scarring: Yes No Abnormalities Noted No Abnormalities Noted Periwound Skin Texture: No Abnormalities Noted No Abnormalities Noted No Abnormalities Noted Periwound Skin Moisture: No Abnormalities Noted No Abnormalities Noted No Abnormalities Noted Periwound Skin Color: No Abnormality No Abnormality No Abnormality Temperature: Debridement Debridement Debridement Procedures Performed: Treatment Notes Electronic Signature(s) Signed: 06/13/2023 12:31:26 PM By: Duanne Guess MD FACS Entered By: Duanne Guess on 06/13/2023 09:15:46 -------------------------------------------------------------------------------- Multi-Disciplinary Care Plan Details Patient Name: Date of Service: [name] Gunning. 06/13/2023 11:00 A M Medical Record Number: [id number] Patient Account Number: [id number] Date of Birth/Sex: Treating RN: 1955/07/01 (67 y.o. [name] Primary Care Acsa Estey: [name] Other Clinician: Referring Grenda Lora: Treating Sheketa Ende/Extender: [name], [name] in Treatment: 6 Active Inactive Wound/Skin Impairment Nursing Diagnoses: Impaired tissue integrity Knowledge deficit related to ulceration/compromised skin integrity Goals: Patient/caregiver will verbalize understanding  of skin care regimen Date Initiated: 05/12/2023 Target Resolution Date: 06/27/2023 Goal Status: Active Ulcer/skin breakdown will heal within 14 [name] Date Initiated: 05/12/2023 Date Inactivated: 06/13/2023 Target Resolution Date: 06/06/2023 Goal Status: Unmet Unmet Reason: continue wound care Interventions: Assess ulceration(s) every visit Treatment Activities: Skin care regimen initiated : 05/12/2023 Topical wound management initiated : 05/12/2023 Notes: [name], [name] ([id number]) [phone].pdf Page 4 of 8 Electronic Signature(s) Signed: 06/13/2023 12:52:17 PM By: Zenaida Deed RN, BSN Entered By: Zenaida Deed on 06/13/2023 08:13:16 -------------------------------------------------------------------------------- Pain Assessment Details Patient Name: Date of Service: ZAHNIYA, GREENWALT 06/13/2023 11:00 A M Medical Record Number: [id number] Patient Account Number: [id number] Date of Birth/Sex: Treating RN: 1956/05/14 (67 y.o. F) Primary Care Daphnee Preiss: [name] Other Clinician: Referring Brigitta Pricer: Treating Eustacio Ellen/Extender: [name], [name] in Treatment: 6 Active Problems Location of Pain Severity and Description of Pain Patient Has Paino No Site Locations Pain Management and Medication Current Pain Management: Electronic Signature(s) Signed: 06/16/2023 8:45:31 AM By: [name] Entered By: [name] on 06/13/2023 08:18:41 -------------------------------------------------------------------------------- Patient/Caregiver Education Details Patient Name: Date of Service: [name], [name]. 12/20/2024andnbsp11:00 A M Medical Record Number: [id number] Patient Account Number: [id number] Date of Birth/Gender: Treating RN: Dec 13, 1955 (67 y.o. [name] Primary Care Physician: [name] Other  Clinician: Referring Physician: Treating Physician/Extender: Candelaria Celeste in Treatment:  6 Education Assessment Education Provided To: Patient AZRAEL, THAKKER ([id number]) [id number] Page 5 of 8 Education Topics Provided Venous: Methods: Explain/Verbal Responses: Reinforcements needed, State content correctly Wound/Skin Impairment: Methods: Explain/Verbal Responses: Reinforcements needed, State content correctly Electronic Signature(s) Signed: 06/13/2023 12:52:17 PM By: Zenaida Deed RN, BSN Entered By: Zenaida Deed on 06/13/2023 08:13:34 -------------------------------------------------------------------------------- Wound Assessment Details Patient Name: Date of Service: [name], [name] 06/13/2023 11:00 A M Medical Record Number: [id number] Patient Account Number: [id number] Date of Birth/Sex: Treating RN: 11-22-1955 (67 y.o. F) Primary Care Shima Compere: [name] Other Clinician: Referring Zacharee Gaddie: Treating Bennie Scaff/Extender: [name], [name] in Treatment: 6 Wound Status Wound Number: 10 Primary Diabetic Wound/Ulcer of the Lower Extremity Etiology: Wound Location: Right, Lateral Foot Wound Open Wounding Event: Gradually Appeared Status: Date Acquired: 06/13/2023 Comorbid Cataracts, Anemia, Sleep Apnea, Arrhythmia, Coronary Artery [name] Of Treatment: 0 History: Disease, Hypertension, Peripheral Venous Disease, Type II Clustered Wound: No Diabetes, Osteoarthritis, Neuropathy Photos Wound Measurements Length: (cm) 0.7 Width: (cm) 0.8 Depth: (cm) 0.1 Area: (cm) 0.44 Volume: (cm) 0.044 % Reduction in Area: % Reduction in Volume: Epithelialization: Small (1-33%) Tunneling: No Undermining: No Wound Description Classification: Grade 1 Wound Margin: Fibrotic scar, thickened scar Exudate Amount: Medium Exudate Type: Serosanguineous Exudate Color: red, brown Foul Odor After Cleansing: No Slough/Fibrino Yes Wound Bed Granulation Amount: Large (67-100%) Exposed Structure Granulation Quality:  Red Fascia Exposed: No Necrotic Amount: None Present (0%) Fat Layer (Subcutaneous Tissue) Exposed: Yes Tendon Exposed: No [name], [name] ([id number]) [id number] Page 6 of 8 Muscle Exposed: No Joint Exposed: No Bone Exposed: No Periwound Skin Texture Texture Color No Abnormalities Noted: No No Abnormalities Noted: Yes Scarring: Yes Temperature / Pain Temperature: No Abnormality Moisture No Abnormalities Noted: Yes Electronic Signature(s) Signed: 06/13/2023 12:14:37 PM By: [name] Schwalbe RN Entered By: [name] on 06/13/2023 08:50:54 -------------------------------------------------------------------------------- Wound Assessment Details Patient Name: Date of Service: [name], [name] 06/13/2023 11:00 A M Medical Record Number: [id number] Patient Account Number: [id number] Date of Birth/Sex: Treating RN: [birth year] (67 y.o. F) Primary Care Delmont Prosch: [name] Other Clinician: Referring Keeven Matty: Treating Tajah Schreiner/Extender: [name], [name] in Treatment: 6 Wound Status Wound Number: 8 Primary Venous Leg Ulcer Etiology: Wound Location: Right, Proximal, Anterior Lower Leg Wound Open Wounding Event: Gradually Appeared Status: Date Acquired: 03/27/2023 Comorbid Cataracts, Anemia, Sleep Apnea, Arrhythmia, Coronary Artery [name] Of Treatment: 6 History: Disease, Hypertension, Peripheral Venous Disease, Type II Clustered Wound: No Diabetes, Osteoarthritis, Neuropathy Photos Wound Measurements Length: (cm) 0.3 Width: (cm) 0.6 Depth: (cm) 0.1 Area: (cm) 0.141 Volume: (cm) 0.014 % Reduction in Area: 82% % Reduction in Volume: 82.3% Epithelialization: Small (1-33%) Tunneling: No Undermining: No Wound Description Classification: Full Thickness Without Exposed Suppor Wound Margin: Fibrotic scar, thickened scar Exudate Amount: Medium Exudate Type: Serosanguineous Exudate Color: red, brown t Structures Foul Odor After  Cleansing: No Slough/Fibrino Yes Wound Bed Granulation Amount: Small (1-33%) Exposed Structure Granulation Quality: Red Fascia Exposed: No Necrotic Amount: Large (67-100%) Fat Layer (Subcutaneous Tissue) Exposed: No [name], [name] ([id number]) [id number] Page 7 of 8 Necrotic Quality: Eschar, Adherent Slough Tendon Exposed: No Muscle Exposed: No Joint Exposed: No Bone Exposed: No Periwound Skin Texture Texture Color No Abnormalities Noted: Yes No Abnormalities Noted: Yes Moisture Temperature / Pain No Abnormalities Noted: Yes Temperature: No Abnormality Electronic Signature(s) Signed: 06/13/2023 12:14:37 PM By: [name] Schwalbe RN Entered By: [name] on 06/13/2023 08:51:55 -------------------------------------------------------------------------------- Wound Assessment Details Patient Name: Date of Service:  [name], [name]. 06/13/2023 11:00 A M Medical Record Number: [id number] Patient Account Number: [id number] Date of Birth/Sex: Treating RN: [birth year] (67 y.o. F) Primary Care Azariah Latendresse: [name] Other Clinician: Referring Daison Braxton: Treating Kathlynn Swofford/Extender: [name], [name] in Treatment: 6 Wound Status Wound Number: 9 Primary Venous Leg Ulcer Etiology: Wound Location: Right, Distal, Anterior Lower Leg Secondary Diabetic Wound/Ulcer of the Lower Extremity Wounding Event: Trauma Etiology: Date Acquired: 04/10/2023 Wound Open [name] Of Treatment: 6 Status: Clustered Wound: No Comorbid Cataracts, Anemia, Sleep Apnea, Arrhythmia, Coronary Artery History: Disease, Hypertension, Peripheral Venous Disease, Type II Diabetes, Osteoarthritis, Neuropathy Photos Wound Measurements Length: (cm) Width: (cm) Depth: (cm) Area: (cm) Volume: (cm) 2.3 % Reduction in Area: -72.2% 3.1 % Reduction in Volume: -72.3% 0.1 Epithelialization: Large (67-100%) 5.6 Tunneling: No 0.56 Undermining: No Wound  Description Classification: Full Thickness Without Exposed Suppo Wound Margin: Fibrotic scar, thickened scar Exudate Amount: Medium Exudate Type: Serosanguineous Exudate Color: red, brown rt Structures Foul Odor After Cleansing: No Slough/Fibrino Yes Wound Bed Granulation Amount: Small (1-33%) Exposed Structure [name], [name] ([id number]) [id number] Page 8 of 8 Granulation Quality: Red Fascia Exposed: No Necrotic Amount: Large (67-100%) Fat Layer (Subcutaneous Tissue) Exposed: Yes Necrotic Quality: Eschar, Adherent Slough Tendon Exposed: No Muscle Exposed: No Joint Exposed: No Bone Exposed: No Periwound Skin Texture Texture Color No Abnormalities Noted: Yes No Abnormalities Noted: Yes Moisture Temperature / Pain No Abnormalities Noted: Yes Temperature: No Abnormality Electronic Signature(s) Signed: 06/13/2023 12:14:37 PM By: [name] Schwalbe RN Entered By: [name] on 06/13/2023 08:53:09 -------------------------------------------------------------------------------- Vitals Details Patient Name: Date of Service: [name] Gunning. 06/13/2023 11:00 A M Medical Record Number: [id number] Patient Account Number: [id number] Date of Birth/Sex: Treating RN: 1956-05-20 (66 y.o. F) Primary Care Jupiter Boys: [name] Other Clinician: Referring Dashton Czerwinski: Treating Rosaly Labarbera/Extender: [name], [name] in Treatment: 6 Vital Signs Time Taken: 11:18 Temperature (F): 97.6 Height (in): 70 Pulse (bpm): 70 Weight (lbs): 166 Respiratory Rate (breaths/min): 18 Body Mass Index (BMI): 23.8 Blood Pressure (mmHg): 130/61 Reference Range: 80 - 120 mg / dl Electronic Signature(s) Signed: 06/16/2023 8:45:31 AM By: [name] Entered By: [name] on 06/13/2023 08:18:35

## 2023-06-16 NOTE — Telephone Encounter (Signed)
Dr. Jena Gauss, [name] was attempted to be evaluated by physical therapy on 12/23. The patient BP was to low for session and recommended follow up with you all for management before returning for new eval.   Once BP stabilized, the patinet would also benefit from speech therapy referral to address cognitive deficits. If you agree, please place an order in [facility] workque in [facility] or fax the order to [phone].  Thank you, Maryruth Eve, PT, DPT  [facility] [address] [place], [place]  [address] Phone:  [phone] Fax:  [phone]

## 2023-06-19 ENCOUNTER — Encounter (HOSPITAL_BASED_OUTPATIENT_CLINIC_OR_DEPARTMENT_OTHER): Payer: Medicare Other | Admitting: General Surgery

## 2023-06-19 DIAGNOSIS — E11621 Type 2 diabetes mellitus with foot ulcer: Secondary | ICD-10-CM | POA: Diagnosis not present

## 2023-06-19 DIAGNOSIS — E11622 Type 2 diabetes mellitus with other skin ulcer: Secondary | ICD-10-CM | POA: Diagnosis not present

## 2023-06-19 DIAGNOSIS — R600 Localized edema: Secondary | ICD-10-CM | POA: Diagnosis not present

## 2023-06-19 DIAGNOSIS — I872 Venous insufficiency (chronic) (peripheral): Secondary | ICD-10-CM | POA: Diagnosis not present

## 2023-06-19 DIAGNOSIS — L97512 Non-pressure chronic ulcer of other part of right foot with fat layer exposed: Secondary | ICD-10-CM | POA: Diagnosis not present

## 2023-06-19 DIAGNOSIS — L97812 Non-pressure chronic ulcer of other part of right lower leg with fat layer exposed: Secondary | ICD-10-CM | POA: Diagnosis not present

## 2023-06-19 DIAGNOSIS — L97815 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 without evidence of necrosis: Secondary | ICD-10-CM | POA: Diagnosis not present

## 2023-06-19 NOTE — Progress Notes (Signed)
ESHE, BAINUM ([id number]) [id number] Page 1 of 8 Visit Report for 06/19/2023 Arrival Information Details Patient Name: Date of Service: [name], [name] 06/19/2023 10:30 A M Medical Record Number: [id number] Patient Account Number: [id number] Date of Birth/Sex: Treating RN: Sep 22, 1955 (67 y.o. F) Primary Care [name]: [name] Other Clinician: Referring [name]: Treating [name]/Extender: [name]: 6 Visit Information History Since Last Visit Added or deleted any medications: No Patient Arrived: Cane Any new allergies or adverse reactions: No Arrival Time: 10:47 Had a fall or experienced change in No Accompanied By: self activities of daily living that may affect Transfer Assistance: None risk of falls: Patient Identification Verified: Yes Signs or symptoms of abuse/neglect since last visito No Secondary Verification Process Completed: Yes Hospitalized since last visit: No Patient Has Alerts: Yes Implantable device outside of the clinic excluding No Patient Alerts: Patient on Blood Thinner cellular tissue based products placed in the center since last visit: Has Dressing in Place as Prescribed: Yes Has Compression in Place as Prescribed: Yes [name] Present Now: Yes Electronic Signature(s) Signed: 06/19/2023 4:00:31 PM By: Thayer Dallas Entered By: Thayer Dallas on 06/19/2023 07:51:13 -------------------------------------------------------------------------------- Lower Extremity Assessment Details Patient Name: Date of Service: [name], [name] 06/19/2023 10:30 A M Medical Record Number: [id number] Patient Account Number: [id number] Date of Birth/Sex: Treating RN: [birth year] (67 y.o. F) Primary Care Luverna Degenhart: [name] Other Clinician: Referring Kepler Mccabe: Treating Johnesha Acheampong/Extender: [name]: 6 Edema Assessment Assessed: [Left: No] [Right:  No] Edema: [Left: Ye] [Right: s] Calf Left: Right: Point of Measurement: 36 cm From Medial Instep 34.3 cm Ankle Left: Right: Point of Measurement: 9 cm From Medial Instep 24.5 cm Vascular Assessment Extremity colors, hair growth, and conditions: Extremity Color: [Right:Red] Hair Growth on Extremity: [Right:No] [name] ([id number]) [Right:[id number] Page 2 of 8] Temperature of Extremity: [Right:Warm] Capillary Refill: [Right:< 3 seconds] Dependent Rubor: [Right:No No] Electronic Signature(s) Signed: 06/19/2023 4:00:31 PM By: Thayer Dallas Entered By: Thayer Dallas on 06/19/2023 08:06:29 -------------------------------------------------------------------------------- Multi Wound Chart Details Patient Name: Date of Service: [name]. 06/19/2023 10:30 A M Medical Record Number: [id number] Patient Account Number: [id number] Date of Birth/Sex: Treating RN: 1955-12-18 (67 y.o. F) Primary Care Angeline Trick: [name] Other Clinician: Referring Netra Postlethwait: Treating Cervando Durnin/Extender: [name]: 6 Vital Signs Height(in): 70 Pulse(bpm): 73 Weight(lbs): 166 Blood Pressure(mmHg): 142/67 Body Mass Index(BMI): 23.8 Temperature(F): 97.8 Respiratory Rate(breaths/min): 18 [10:Photos:] Right, Lateral Foot Right, Proximal, Anterior Lower Leg Right, Distal, Anterior Lower Leg Wound Location: Gradually Appeared Gradually Appeared Trauma Wounding Event: Diabetic Wound/Ulcer of the Lower Venous Leg Ulcer Venous Leg Ulcer Primary Etiology: Extremity N/A N/A Diabetic Wound/Ulcer of the Lower Secondary Etiology: Extremity Cataracts, Anemia, Sleep Apnea, Cataracts, Anemia, Sleep Apnea, Cataracts, Anemia, Sleep Apnea, Comorbid History: Arrhythmia, Coronary Artery Disease, Arrhythmia, Coronary Artery Disease, Arrhythmia, Coronary Artery Disease, Hypertension, Peripheral Venous Hypertension, Peripheral Venous  Hypertension, Peripheral Venous Disease, Type II Diabetes, Disease, Type II Diabetes, Disease, Type II Diabetes, Osteoarthritis, Neuropathy Osteoarthritis, Neuropathy Osteoarthritis, Neuropathy 06/13/2023 03/27/2023 04/10/2023 Date Acquired: 0 6 6 [name] of [name]: Open Open Open Wound Status: No No No Wound Recurrence: 0.8x0.5x0.1 0.1x0.1x0.1 1.9x1.2x0.1 Measurements [name] x W x D (cm) 0.314 0.008 1.791 A (cm) : rea 0.031 0.001 0.179 Volume (cm) : 28.60% 99.00% 44.90% % Reduction in Area: 29.50% 98.70% 44.90% % Reduction in Volume: Grade 1 Full Thickness Without Exposed Full Thickness Without Exposed Classification: Support Structures Support Structures Medium Medium Medium Exudate A mount: Serosanguineous Serosanguineous  Serosanguineous Exudate Type: red, brown red, brown red, brown Exudate Color: Fibrotic scar, thickened scar Fibrotic scar, thickened scar Fibrotic scar, thickened scar Wound Margin: Small (1-33%) Large (67-100%) None Present (0%) Granulation Amount: N/A Red N/A Granulation Quality: Large (67-100%) Small (1-33%) Large (67-100%) Necrotic Amount: Adherent Slough Eschar, Adherent Slough Eschar, Adherent Slough Necrotic Tissue: Fat Layer (Subcutaneous Tissue): Yes Fascia: No Fat Layer (Subcutaneous Tissue): Yes Exposed Structures: Fascia: No Fat Layer (Subcutaneous Tissue): No Fascia: No Tendon: No Tendon: No Tendon: No Muscle: No Muscle: No Muscle: No [name] ([id number]) [id number] Page 3 of 8 Joint: No Joint: No Joint: No Bone: No Bone: No Bone: No Small (1-33%) Small (1-33%) Large (67-100%) Epithelialization: Debridement - Selective/Open Wound Debridement - Excisional Debridement - Excisional Debridement: Pre-procedure Verification/Time Out 11:25 11:25 11:25 Taken: Lidocaine 4% Topical Solution Lidocaine 4% Topical Solution Lidocaine 4% Topical Solution [name] Control: Necrotic/Eschar, Slough Tendon,  Subcutaneous, Slough Tendon, Subcutaneous, Slough Tissue Debrided: Non-Viable Tissue Skin/Subcutaneous Tissue/Muscle Skin/Subcutaneous Tissue/Muscle Level: 0.31 0.5 1.79 Debridement A (sq cm): rea Curette Curette, Forceps, Scissors Curette, Forceps, Scissors Instrument: Minimum Minimum Minimum Bleeding: Pressure Pressure Pressure Hemostasis A chieved: 6 6 6  Procedural [name]: 5 4 5  Post Procedural [name]: Procedure was tolerated well Procedure was tolerated well Procedure was tolerated well Debridement [name] Response: 0.8x0.5x0.1 0.8x0.8x0.1 1.9x1.2x0.1 Post Debridement Measurements [name] x W x D (cm) 0.031 0.05 0.179 Post Debridement Volume: (cm) Scarring: Yes No Abnormalities Noted No Abnormalities Noted Periwound Skin Texture: No Abnormalities Noted No Abnormalities Noted No Abnormalities Noted Periwound Skin Moisture: No Abnormalities Noted No Abnormalities Noted No Abnormalities Noted Periwound Skin Color: No Abnormality No Abnormality No Abnormality Temperature: Debridement Debridement N/A Procedures Performed: [name] Notes Electronic Signature(s) Signed: 06/19/2023 12:38:46 PM By: Duanne Guess MD FACS Entered By: Duanne Guess on 06/19/2023 08:38:37 -------------------------------------------------------------------------------- Multi-Disciplinary Care Plan Details Patient Name: Date of Service: [name]. 06/19/2023 10:30 A M Medical Record Number: [id number] Patient Account Number: [id number] Date of Birth/Sex: Treating RN: 05-Jan-1956 (67 y.o. Arta Silence Primary Care Stone Spirito: [name] Other Clinician: Referring Keysean Savino: Treating Burnard Enis/Extender: [name]: 6 Active Inactive Wound/Skin Impairment Nursing Diagnoses: Impaired tissue integrity Knowledge deficit related to ulceration/compromised skin integrity Goals: Patient/caregiver will verbalize understanding of skin care regimen Date  Initiated: 05/12/2023 Target Resolution Date: 08/22/2023 Goal Status: Active Ulcer/skin breakdown will heal within 14 [name] Date Initiated: 05/12/2023 Date Inactivated: 06/13/2023 Target Resolution Date: 06/06/2023 Goal Status: Unmet Unmet Reason: continue wound care Interventions: Assess ulceration(s) every visit [name] Activities: Skin care regimen initiated : 05/12/2023 Topical wound management initiated : 05/12/2023 Notes: Electronic Signature(s) Signed: 06/19/2023 3:30:05 PM By: Shawn Stall RN, BSN [name], [name] ([id number]) [id number] Page 4 of 8 Signed: 06/19/2023 3:30:05 PM By: Shawn Stall RN, BSN Entered By: Shawn Stall on 06/19/2023 08:13:11 -------------------------------------------------------------------------------- [name] Assessment Details Patient Name: Date of Service: AMAYAH, CRIVELLI 06/19/2023 10:30 A M Medical Record Number: [id number] Patient Account Number: [id number] Date of Birth/Sex: Treating RN: [birth year] (67 y.o. F) Primary Care Ishmail Mcmanamon: [name] Other Clinician: Referring Palmira Stickle: Treating Bronwyn Belasco/Extender: [name]: 6 Active Problems Location of [name] Severity and Description of [name] Patient Has Paino Yes Site Locations [name] Location: Generalized [name] Rate the [name]. Current [name] Level: 7 [name] Management and Medication Current [name] Management: Electronic Signature(s) Signed: 06/19/2023 4:00:31 PM By: Thayer Dallas Entered By: Thayer Dallas on 06/19/2023 07:54:53 -------------------------------------------------------------------------------- Patient/Caregiver Education Details Patient Name: Date of Service: [name] 12/26/2024andnbsp10:30 A M Medical Record Number:  [id number] Patient Account Number: [id number] Date of Birth/Gender: Treating RN: [birth year] (67 y.o. Arta Silence Primary Care Physician: [name] Other Clinician: Referring  Physician: Treating Physician/Extender: Candelaria Celeste in [name]: 6 Education Assessment Education Provided To: Patient Education Topics Provided [name], [name] ([id number]) [id number] Page 5 of 8 Wound/Skin Impairment: Handouts: Caring for Your Ulcer Methods: Explain/Verbal Responses: Reinforcements needed Electronic Signature(s) Signed: 06/19/2023 3:30:05 PM By: Shawn Stall RN, BSN Entered By: Shawn Stall on 06/19/2023 08:13:21 -------------------------------------------------------------------------------- Wound Assessment Details Patient Name: Date of Service: [name]. 06/19/2023 10:30 A M Medical Record Number: [id number] Patient Account Number: [id number] Date of Birth/Sex: Treating RN: May 08, 1956 (67 y.o. F) Primary Care Nhung Danko: [name] Other Clinician: Referring Deseri Loss: Treating Morelia Cassells/Extender: [name]: 6 Wound Status Wound Number: 10 Primary Diabetic Wound/Ulcer of the Lower Extremity Etiology: Wound Location: Right, Lateral Foot Wound Open Wounding Event: Gradually Appeared Status: Date Acquired: 06/13/2023 Comorbid Cataracts, Anemia, Sleep Apnea, Arrhythmia, Coronary Artery [name] Of [name]: 0 History: Disease, Hypertension, Peripheral Venous Disease, Type II Clustered Wound: No Diabetes, Osteoarthritis, Neuropathy Photos Wound Measurements Length: (cm) 0.8 Width: (cm) 0.5 Depth: (cm) 0.1 Area: (cm) 0.314 Volume: (cm) 0.031 % Reduction in Area: 28.6% % Reduction in Volume: 29.5% Epithelialization: Small (1-33%) Tunneling: No Undermining: No Wound Description Classification: Grade 1 Wound Margin: Fibrotic scar, thickened scar Exudate Amount: Medium Exudate Type: Serosanguineous Exudate Color: red, brown Foul Odor After Cleansing: No Slough/Fibrino Yes Wound Bed Granulation Amount: Small (1-33%) Exposed Structure Necrotic  Amount: Large (67-100%) Fascia Exposed: No Necrotic Quality: Adherent Slough Fat Layer (Subcutaneous Tissue) Exposed: Yes Tendon Exposed: No Muscle Exposed: No Joint Exposed: No Bone Exposed: No Periwound Skin Texture [name], [name] ([id number]) [id number] Page 6 of 8 Texture Color No Abnormalities Noted: No No Abnormalities Noted: Yes Scarring: Yes Temperature / [name] Temperature: No Abnormality Moisture No Abnormalities Noted: Yes Electronic Signature(s) Signed: 06/19/2023 4:00:31 PM By: Thayer Dallas Entered By: Thayer Dallas on 06/19/2023 08:15:56 -------------------------------------------------------------------------------- Wound Assessment Details Patient Name: Date of Service: [name], [name] 06/19/2023 10:30 A M Medical Record Number: [id number] Patient Account Number: [id number] Date of Birth/Sex: Treating RN: March 16, 1956 (67 y.o. [name], [name] Primary Care Smt Lokey: [name] Other Clinician: Referring Jaimey Franchini: Treating [place] Frieden/Extender: [name]: 6 Wound Status Wound Number: 8 Primary Venous Leg Ulcer Etiology: Wound Location: Right, Proximal, Anterior Lower Leg Wound Healed - Epithelialized Wounding Event: Gradually Appeared Status: Date Acquired: 03/27/2023 Comorbid Cataracts, Anemia, Sleep Apnea, Arrhythmia, Coronary Artery [name] Of [name]: 6 History: Disease, Hypertension, Peripheral Venous Disease, Type II Clustered Wound: No Diabetes, Osteoarthritis, Neuropathy Photos Wound Measurements Length: (cm) Width: (cm) Depth: (cm) Area: (cm) Volume: (cm) 0 % Reduction in Area: 100% 0 % Reduction in Volume: 100% 0 Epithelialization: Small (1-33%) 0 Tunneling: No 0 Undermining: No Wound Description Classification: Full Thickness Without Exposed Suppor Wound Margin: Fibrotic scar, thickened scar Exudate Amount: Medium Exudate Type: Serosanguineous Exudate Color: red,  brown t Structures Foul Odor After Cleansing: No Slough/Fibrino Yes Wound Bed Granulation Amount: Large (67-100%) Exposed Structure Granulation Quality: Red Fascia Exposed: No Necrotic Amount: Small (1-33%) Fat Layer (Subcutaneous Tissue) Exposed: No Necrotic Quality: Eschar Tendon Exposed: No Muscle Exposed: No Joint Exposed: No Bone Exposed: No Bencivenga, Taelyn [name] ([id number]) [id number] Page 7 of 8 Periwound Skin Texture Texture Color No Abnormalities Noted: Yes No Abnormalities Noted: Yes Moisture Temperature / [name] No Abnormalities Noted: Yes Temperature: No Abnormality Electronic Signature(s) Signed: 06/19/2023 4:22:52 PM By: Daneil [name],  Legrand Como, BSN Entered By: Zenaida Deed on 06/19/2023 08:39:21 -------------------------------------------------------------------------------- Wound Assessment Details Patient Name: Date of Service: [name], [name] 06/19/2023 10:30 A M Medical Record Number: [id number] Patient Account Number: [id number] Date of Birth/Sex: Treating RN: 1955/10/05 (67 y.o. F) Primary Care Perrin Gens: [name] Other Clinician: Referring Kylei Purington: Treating Janiah Devinney/Extender: [name]: 6 Wound Status Wound Number: 9 Primary Venous Leg Ulcer Etiology: Wound Location: Right, Distal, Anterior Lower Leg Secondary Diabetic Wound/Ulcer of the Lower Extremity Wounding Event: Trauma Etiology: Date Acquired: 04/10/2023 Wound Open [name] Of [name]: 6 Status: Clustered Wound: No Comorbid Cataracts, Anemia, Sleep Apnea, Arrhythmia, Coronary Artery History: Disease, Hypertension, Peripheral Venous Disease, Type II Diabetes, Osteoarthritis, Neuropathy Photos Wound Measurements Length: (cm) 1.9 Width: (cm) 1.2 Depth: (cm) 0.1 Area: (cm) 1.791 Volume: (cm) 0.179 % Reduction in Area: 44.9% % Reduction in Volume: 44.9% Epithelialization: Large (67-100%) Tunneling: No Undermining:  No Wound Description Classification: Full Thickness Without Exposed Support Wound Margin: Fibrotic scar, thickened scar Exudate Amount: Medium Exudate Type: Serosanguineous Exudate Color: red, brown Structures Foul Odor After Cleansing: No Slough/Fibrino Yes Wound Bed Granulation Amount: None Present (0%) Exposed Structure Necrotic Amount: Large (67-100%) Fascia Exposed: No Necrotic Quality: Eschar, Adherent Slough Fat Layer (Subcutaneous Tissue) Exposed: Yes Tendon Exposed: No Muscle Exposed: No Joint Exposed: No [name], [name] ([id number]) [id number] Page 8 of 8 Bone Exposed: No Periwound Skin Texture Texture Color No Abnormalities Noted: Yes No Abnormalities Noted: Yes Moisture Temperature / [name] No Abnormalities Noted: Yes Temperature: No Abnormality Electronic Signature(s) Signed: 06/19/2023 4:00:31 PM By: Thayer Dallas Entered By: Thayer Dallas on 06/19/2023 08:18:04 -------------------------------------------------------------------------------- Vitals Details Patient Name: Date of Service: [name]. 06/19/2023 10:30 A M Medical Record Number: [id number] Patient Account Number: [id number] Date of Birth/Sex: Treating RN: 1955-08-04 (67 y.o. F) Primary Care Sarah Baez: [name] Other Clinician: Referring Jayra Choyce: Treating Bryonna Sundby/Extender: [name]: 6 Vital Signs Time Taken: 10:51 Temperature (F): 97.8 Height (in): 70 Pulse (bpm): 73 Weight (lbs): 166 Respiratory Rate (breaths/min): 18 Body Mass Index (BMI): 23.8 Blood Pressure (mmHg): 142/67 Reference Range: 80 - 120 mg / dl Electronic Signature(s) Signed: 06/19/2023 4:00:31 PM By: Thayer Dallas Entered By: Thayer Dallas on 06/19/2023 07:54:36

## 2023-06-19 NOTE — Progress Notes (Signed)
[name], [name] ([id number]) [id number] Page 1 of 9 Visit Report for 06/19/2023 Chief Complaint Document Details Patient Name: Date of Service: [name], [name] 06/19/2023 10:30 A M Medical Record Number: [id number] Patient Account Number: [id number] Date of Birth/Sex: Treating RN: 06-26-1955 (67 y.o. F) Primary Care Provider: Alfredo Martinez Other Clinician: Referring Provider: Treating Provider/Extender: Dan Europe, [name] in Treatment: 6 Information Obtained from: Patient Chief Complaint Patient presents for treatment of an open ulcer due to venous insufficiency Electronic Signature(s) Signed: 06/19/2023 12:38:46 PM By: Duanne Guess MD FACS Entered By: Duanne Guess on 06/19/2023 08:39:24 -------------------------------------------------------------------------------- Debridement Details Patient Name: Date of Service: [name]. 06/19/2023 10:30 A M Medical Record Number: [id number] Patient Account Number: [id number] Date of Birth/Sex: Treating RN: January 07, 1956 (67 y.o. Billy Coast, [name] Primary Care Provider: Alfredo Martinez Other Clinician: Referring Provider: Treating Provider/Extender: Dan Europe, [name] in Treatment: 6 Debridement Performed for Assessment: Wound #10 Right,Lateral Foot Performed By: Physician Duanne Guess, MD The following information was scribed by: Zenaida Deed The information was scribed for: Duanne Guess Debridement Type: Debridement Severity of Tissue Pre Debridement: Fat layer exposed Level of Consciousness (Pre-procedure): Awake and Alert Pre-procedure Verification/Time Out Yes - 11:25 Taken: Start Time: 11:28 Pain Control: Lidocaine 4% Topical Solution Percent of Wound Bed Debrided: 100% T Area Debrided (cm): otal 0.31 Tissue and other material debrided: Non-Viable, Eschar, Slough, Slough Level: Non-Viable Tissue Debridement Description: Selective/Open  Wound Instrument: Curette Bleeding: Minimum Hemostasis Achieved: Pressure Procedural Pain: 6 Post Procedural Pain: 5 Response to Treatment: Procedure was tolerated well Level of Consciousness (Post- Awake and Alert procedure): Post Debridement Measurements of Total Wound Length: (cm) 0.8 Width: (cm) 0.5 Depth: (cm) 0.1 Volume: (cm) 0.031 Rahimi, Carissa L ([id number]) [id number] Page 2 of 9 Character of Wound/Ulcer Post Debridement: Improved Severity of Tissue Post Debridement: Fat layer exposed Post Procedure Diagnosis Same as Pre-procedure Electronic Signature(s) Signed: 06/19/2023 12:38:46 PM By: Duanne Guess MD FACS Signed: 06/19/2023 4:22:52 PM By: Zenaida Deed RN, BSN Entered By: Zenaida Deed on 06/19/2023 08:34:54 -------------------------------------------------------------------------------- Debridement Details Patient Name: Date of Service: [name]. 06/19/2023 10:30 A M Medical Record Number: [id number] Patient Account Number: [id number] Date of Birth/Sex: Treating RN: 06-13-1956 (67 y.o. [name] Primary Care Provider: Alfredo Martinez Other Clinician: Referring Provider: Treating Provider/Extender: Dan Europe, [name] in Treatment: 6 Debridement Performed for Assessment: Wound #9 Right,Distal,Anterior Lower Leg Performed By: Physician Duanne Guess, MD The following information was scribed by: Zenaida Deed The information was scribed for: Duanne Guess Debridement Type: Debridement Severity of Tissue Pre Debridement: Necrosis of muscle Level of Consciousness (Pre-procedure): Awake and Alert Pre-procedure Verification/Time Out Yes - 11:25 Taken: Start Time: 11:28 Pain Control: Lidocaine 4% T opical Solution Percent of Wound Bed Debrided: 100% T Area Debrided (cm): otal 1.79 Tissue and other material debrided: Viable, Non-Viable, Slough, Subcutaneous, Tendon, Slough Level:  Skin/Subcutaneous Tissue/Muscle Debridement Description: Excisional Instrument: Curette, Forceps, Scissors Bleeding: Minimum Hemostasis Achieved: Pressure Procedural Pain: 6 Post Procedural Pain: 5 Response to Treatment: Procedure was tolerated well Level of Consciousness (Post- Awake and Alert procedure): Post Debridement Measurements of Total Wound Length: (cm) 1.9 Width: (cm) 1.2 Depth: (cm) 0.1 Volume: (cm) 0.179 Character of Wound/Ulcer Post Debridement: Improved Severity of Tissue Post Debridement: Necrosis of muscle Post Procedure Diagnosis Same as Pre-procedure Electronic Signature(s) Signed: 06/19/2023 12:38:46 PM By: Duanne Guess MD FACS Signed: 06/19/2023 4:22:52 PM By: Zenaida Deed RN, BSN Entered By: Zenaida Deed on 06/19/2023 08:40:53 Sapien, Malala L ([id number]) [id number] Page 3  of 9 -------------------------------------------------------------------------------- HPI Details Patient Name: Date of Service: [name], [name] 06/19/2023 10:30 A M Medical Record Number: [id number] Patient Account Number: [id number] Date of Birth/Sex: Treating RN: June 24, 1956 (67 y.o. F) Primary Care Provider: Alfredo Martinez Other Clinician: Referring Provider: Treating Provider/Extender: Dan Europe, [name] in Treatment: 6 History of Present Illness HPI Description: ADMISSION 05/02/2023 ***ABIs R: 0.88, palpable pulses*** This is a 67 year old woman with well-controlled type 2 diabetes (last hemoglobin A1c 5.4%) presenting to the wound care center with two ulcers on her right lower leg. She saw her primary care provider earlier this week and per their note, she has had lower leg swelling and erythema for about 2 months. Her PCP prescribed Augmentin and doxycycline. 05/12/2023: Both wounds measured smaller today. There is some slough and eschar accumulation on each. She saw her PCP who removed our wraps for some reason and  therefore her edema control is not ideal. 05/20/2023: The proximal wound measured smaller today. The distal wound measured slightly larger, but this does incorporate a fair amount of scab/eschar. Edema control is much better this week. 12/3; the patient was in for a nurse visit to change her dressings today. I was asked to look at the wound which had a large dried eschar over the surface on the right anterior lower leg I suspect this is mostly dried blood from last week. 06/13/2023: The proximal ulcer is nearly healed. The distal ulcer has a large eschar over it with a much smaller underlying actual wound. T endon is exposed at the base of the wound. She also has a new wound on her right lateral foot. It looks as if it started out as a blister that subsequently ruptured. She is complaining of more neuropathic-type pain in both of her feet. 06/19/2023: The proximal ulcer has just a tiny bit of scab on the surface. The distal ulcer has a little bit less eschar present today, but the tendon that is exposed is grayish in color. The wound on her right lateral foot is superficial with a little eschar and slough present. She continues to complain of neuropathic pain, but has not discussed her gabapentin dosage with her PCP. Electronic Signature(s) Signed: 06/19/2023 12:38:46 PM By: Duanne Guess MD FACS Entered By: Duanne Guess on 06/19/2023 08:40:32 -------------------------------------------------------------------------------- Physical Exam Details Patient Name: Date of Service: TERRENA, GARCED 06/19/2023 10:30 A M Medical Record Number: [id number] Patient Account Number: [id number] Date of Birth/Sex: Treating RN: Oct 03, 1955 (67 y.o. F) Primary Care Provider: Alfredo Martinez Other Clinician: Referring Provider: Treating Provider/Extender: Dan Europe, [name] in Treatment: 6 Constitutional Slightly hypertensive. . . . no acute distress. Respiratory Normal work of  breathing on room air.. Notes 06/19/2023: The proximal ulcer has just a tiny bit of scab on the surface. The distal ulcer has a little bit less eschar present today, but the tendon that is exposed is grayish in color. The wound on her right lateral foot is superficial with a little eschar and slough present. Electronic Signature(s) Signed: 06/19/2023 12:38:46 PM By: Duanne Guess MD FACS Entered By: Duanne Guess on 06/19/2023 08:41:31 Ballantine, Leotis Pain ([id number]) [id number] Page 4 of 9 -------------------------------------------------------------------------------- Physician Orders Details Patient Name: Date of Service: GENENE, WYLES 06/19/2023 10:30 A M Medical Record Number: [id number] Patient Account Number: [id number] Date of Birth/Sex: Treating RN: February 28, 1956 (67 y.o. 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 - Dr. Lady Gary Return A 1/3 @ 09:15 am Anesthetic (In clinic) Topical Lidocaine 4% applied to wound bed Bathing/ Shower/ Hygiene May shower with protection but do not get wound dressing(s) wet. Protect dressing(s) with water repellant cover (for example, large plastic bag) or a cast cover and may then take  shower. Edema Control - Orders / Instructions Elevate legs to the level of the heart or above for 30 minutes daily and/or when sitting for 3-4 times a day throughout the day. Avoid standing for long periods of time. Exercise regularly Wound Treatment Wound #10 - Foot Wound Laterality: Right, Lateral Peri-Wound Care: Sween Lotion (Moisturizing lotion) 1 x Per Week Discharge Instructions: Apply moisturizing lotion as directed Prim Dressing: Promogran Prisma Matrix, 4.34 (sq in) (silver collagen) 1 x Per Week ary Discharge Instructions: Moisten collagen with saline or hydrogel Secondary Dressing: Optifoam Non-Adhesive Dressing, 4x4 in 1 x Per Week Discharge Instructions: Apply over primary dressing cut to make foam donut Secondary Dressing: Woven Gauze Sponges 2x2 in 1 x Per Week Discharge Instructions: Apply over primary dressing as directed. Secured With: 16M Medipore H Soft Cloth Surgical T ape, 4 x 10 (in/yd) 1 x Per Week Discharge Instructions: Secure with tape as directed. Wound #9 - Lower Leg Wound Laterality: Right, Anterior, Distal Cleanser: Soap and Water 1 x Per Week/30 Days Discharge Instructions: May shower and wash wound with dial antibacterial soap and water prior to dressing change. Cleanser: Wound Cleanser 1 x Per Week/30 Days Discharge Instructions: Cleanse the wound with wound cleanser prior to applying a clean dressing using gauze sponges, not tissue or cotton balls. Peri-Wound Care: Sween Lotion (Moisturizing lotion) 1 x Per Week/30 Days Discharge Instructions: Apply moisturizing lotion as directed Prim Dressing: Promogran Prisma Matrix, 4.34 (sq in) (silver collagen) 1 x Per Week/30 Days ary Discharge Instructions: Moisten collagen with saline or hydrogel Gurganus, Anatalia L ([id number]) [id number] Page 5 of 9 Secondary Dressing: ABD Pad, 8x10 1 x Per Week/30 Days Discharge Instructions: Apply over primary dressing as directed. Compression Wrap:  Kerlix Roll 4.5x3.1 (in/yd) 1 x Per Week/30 Days Discharge Instructions: Apply Kerlix and Coban compression as directed. Compression Wrap: Coban Self-Adherent Wrap 4x5 (in/yd) 1 x Per Week/30 Days Discharge Instructions: Apply over Kerlix as directed. Electronic Signature(s) Signed: 06/19/2023 12:38:46 PM By: Duanne Guess MD FACS Entered By: Duanne Guess on 06/19/2023 08:42:17 -------------------------------------------------------------------------------- Problem List Details Patient Name: Date of Service: [name] 06/19/2023 10:30 A M Medical Record Number: [id number] Patient Account Number: [id number] Date of Birth/Sex: Treating RN: 1956-02-09 (67 y.o. Arta Silence Primary Care Provider: Alfredo Martinez Other Clinician: Referring Provider: Treating Provider/Extender: Dan Europe, [name] in Treatment: 6 Active Problems ICD-10 Encounter Code Description Active Date MDM Diagnosis L97.812 Non-pressure chronic ulcer of other part of right lower leg with fat layer 05/02/2023 No Yes exposed L97.815 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 06/13/2023 No Yes involvement without evidence of necrosis L97.512 Non-pressure chronic ulcer of other part of right  foot with fat layer exposed 06/13/2023 No Yes E11.622 Type 2 diabetes mellitus with other skin ulcer 05/02/2023 No Yes R60.0 Localized edema 05/02/2023 No Yes Inactive Problems Resolved Problems Electronic Signature(s) Signed: 06/19/2023 12:38:46 PM By: Duanne Guess MD FACS Entered By: Duanne Guess on 06/19/2023 08:38:22 Vilar, Vonita L ([id number]) [id number] Page 6 of 9 -------------------------------------------------------------------------------- Progress Note Details Patient Name: Date of Service: IZADORA, HUSER 06/19/2023 10:30 A M Medical Record Number: [id number] Patient Account Number: [id number] Date of Birth/Sex: Treating  RN: [birth year] (67 y.o. F) Primary Care Provider: Alfredo Martinez Other Clinician: Referring Provider: Treating Provider/Extender: Dan Europe, [name] in Treatment: 6 Subjective Chief Complaint Information obtained from Patient Patient presents for treatment of an open ulcer due to venous insufficiency History of Present Illness (HPI) ADMISSION 05/02/2023 ***ABIs R: 0.88, palpable pulses*** This is a 67 year old woman with well-controlled type 2 diabetes (last hemoglobin A1c 5.4%) presenting to the wound care center with two ulcers on her right lower leg. She saw her primary care provider earlier this week and per their note, she has had lower leg swelling and erythema for about 2 months. Her PCP prescribed Augmentin and doxycycline. 05/12/2023: Both wounds measured smaller today. There is some slough and eschar accumulation on each. She saw her PCP who removed our wraps for some reason and therefore her edema control is not ideal. 05/20/2023: The proximal wound measured smaller today. The distal wound measured slightly larger, but this does incorporate a fair amount of scab/eschar. Edema control is much better this week. 12/3; the patient was in for a nurse visit to change her dressings today. I was asked to look at the wound which had a large dried eschar over the surface on the right anterior lower leg I suspect this is mostly dried blood from last week. 06/13/2023: The proximal ulcer is nearly healed. The distal ulcer has a large eschar over it with a much smaller underlying actual wound. T endon is exposed at the base of the wound. She also has a new wound on her right lateral foot. It looks as if it started out as a blister that subsequently ruptured. She is complaining of more neuropathic-type pain in both of her feet. 06/19/2023: The proximal ulcer has just a tiny bit of scab on the surface. The distal ulcer has a little bit less eschar present today, but the tendon  that is exposed is grayish in color. The wound on her right lateral foot is superficial with a little eschar and slough present. She continues to complain of neuropathic pain, but has not discussed her gabapentin dosage with her PCP. Objective Constitutional Slightly hypertensive. no acute distress. Vitals Time Taken: 10:51 AM, Height: 70 in, Weight: 166 lbs, BMI: 23.8, Temperature: 97.8 F, Pulse: 73 bpm, Respiratory Rate: 18 breaths/min, Blood Pressure: 142/67 mmHg. Respiratory Normal work of breathing on room air.. General Notes: 06/19/2023: The proximal ulcer has just a tiny bit of scab on the surface. The distal ulcer has a little bit less eschar present today, but the tendon that is exposed is grayish in color. The wound on her right lateral foot is superficial with a little eschar and slough present. Integumentary (Hair, Skin) Wound #10 status is Open. Original cause of wound was Gradually Appeared. The date acquired was: 06/13/2023. The wound is located on the Right,Lateral Foot. The wound measures 0.8cm length x 0.5cm width x 0.1cm depth; 0.314cm^2 area and 0.031cm^3 volume. There is Fat Layer (Subcutaneous Tissue) exposed. There is no tunneling or undermining  noted. There is a medium amount of serosanguineous drainage noted. The wound margin is fibrotic, thickened scar. There is small (1-33%) granulation within the wound bed. There is a large (67-100%) amount of necrotic tissue within the wound bed including Adherent Slough. The periwound skin appearance had no abnormalities noted for moisture. The periwound skin appearance had no abnormalities noted for color. The periwound skin appearance exhibited: Scarring. Periwound temperature was noted as No Abnormality. Wound #8 status is Healed - Epithelialized. Original cause of wound was Gradually Appeared. The date acquired was: 03/27/2023. The wound has been in treatment 6 weeks. The wound is located on the Right,Proximal,Anterior Lower Leg.  The wound measures 0cm length x 0cm width x 0cm depth; 0cm^2 area and 0cm^3 volume. There is no tunneling or undermining noted. There is a medium amount of serosanguineous drainage noted. The wound margin is fibrotic, thickened scar. There is large (67-100%) red granulation within the wound bed. There is a small (1-33%) amount of necrotic tissue within the wound bed including Eschar. The periwound skin appearance had no abnormalities noted for texture. The periwound skin appearance had no abnormalities noted for moisture. The periwound skin appearance had no abnormalities noted for color. Periwound temperature was noted as No Abnormality. MAILAH, RIERSON ([id number]) [id number] Page 7 of 9 Wound #9 status is Open. Original cause of wound was Trauma. The date acquired was: 04/10/2023. The wound has been in treatment 6 weeks. The wound is located on the Right,Distal,Anterior Lower Leg. The wound measures 1.9cm length x 1.2cm width x 0.1cm depth; 1.791cm^2 area and 0.179cm^3 volume. There is Fat Layer (Subcutaneous Tissue) exposed. There is no tunneling or undermining noted. There is a medium amount of serosanguineous drainage noted. The wound margin is fibrotic, thickened scar. There is no granulation within the wound bed. There is a large (67-100%) amount of necrotic tissue within the wound bed including Eschar and Adherent Slough. The periwound skin appearance had no abnormalities noted for texture. The periwound skin appearance had no abnormalities noted for moisture. The periwound skin appearance had no abnormalities noted for color. Periwound temperature was noted as No Abnormality. Assessment Active Problems ICD-10 Non-pressure chronic ulcer of other part of right lower leg with fat layer exposed 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 without evidence of necrosis Non-pressure chronic ulcer of other part of right foot with  fat layer exposed Type 2 diabetes mellitus with other skin ulcer Localized edema Procedures Wound #10 Pre-procedure diagnosis of Wound #10 is a Diabetic Wound/Ulcer of the Lower Extremity located on the Right,Lateral Foot .Severity of Tissue Pre Debridement is: Fat layer exposed. There was a Selective/Open Wound Non-Viable Tissue Debridement with a total area of 0.31 sq cm performed by Duanne Guess, MD. With the following instrument(s): Curette to remove Non-Viable tissue/material. Material removed includes Eschar and Slough and after achieving pain control using Lidocaine 4% T opical Solution. No specimens were taken. A time out was conducted at 11:25, prior to the start of the procedure. A Minimum amount of bleeding was controlled with Pressure. The procedure was tolerated well with a pain level of 6 throughout and a pain level of 5 following the procedure. Post Debridement Measurements: 0.8cm length x 0.5cm width x 0.1cm depth; 0.031cm^3 volume. Character of Wound/Ulcer Post Debridement is improved. Severity of Tissue Post Debridement is: Fat layer exposed. Post procedure Diagnosis Wound #10: Same as Pre-Procedure Wound #9 Pre-procedure diagnosis of Wound #9 is a Venous Leg Ulcer located on the  Right,Distal,Anterior Lower Leg .Severity of Tissue Pre Debridement is: Necrosis of muscle. There was a Excisional Skin/Subcutaneous Tissue/Muscle Debridement with a total area of 1.79 sq cm performed by Duanne Guess, MD. With the following instrument(s): Curette, Forceps, and Scissors to remove Viable and Non-Viable tissue/material. Material removed includes Tendon, Subcutaneous Tissue, and Slough after achieving pain control using Lidocaine 4% Topical Solution. No specimens were taken. A time out was conducted at 11:25, prior to the start of the procedure. A Minimum amount of bleeding was controlled with Pressure. The procedure was tolerated well with a pain level of 6 throughout and a pain  level of 5 following the procedure. Post Debridement Measurements: 1.9cm length x 1.2cm width x 0.1cm depth; 0.179cm^3 volume. Character of Wound/Ulcer Post Debridement is improved. Severity of Tissue Post Debridement is: Necrosis of muscle. Post procedure Diagnosis Wound #9: Same as Pre-Procedure Plan Follow-up Appointments: Return Appointment in 1 week. - Dr. Lady Gary 1/3 @ 09:15 am Anesthetic: (In clinic) Topical Lidocaine 4% applied to wound bed Bathing/ Shower/ Hygiene: May shower with protection but do not get wound dressing(s) wet. Protect dressing(s) with water repellant cover (for example, large plastic bag) or a cast cover and may then take shower. Edema Control - Orders / Instructions: Elevate legs to the level of the heart or above for 30 minutes daily and/or when sitting for 3-4 times a day throughout the day. Avoid standing for long periods of time. Exercise regularly WOUND #10: - Foot Wound Laterality: Right, Lateral Peri-Wound Care: Sween Lotion (Moisturizing lotion) 1 x Per Week/ Discharge Instructions: Apply moisturizing lotion as directed Prim Dressing: Promogran Prisma Matrix, 4.34 (sq in) (silver collagen) 1 x Per Week/ ary Discharge Instructions: Moisten collagen with saline or hydrogel Secondary Dressing: Optifoam Non-Adhesive Dressing, 4x4 in 1 x Per Week/ Discharge Instructions: Apply over primary dressing cut to make foam donut Secondary Dressing: Woven Gauze Sponges 2x2 in 1 x Per Week/ Discharge Instructions: Apply over primary dressing as directed. Secured With: 41M Medipore H Soft Cloth Surgical T ape, 4 x 10 (in/yd) 1 x Per Week/ Discharge Instructions: Secure with tape as directed. WOUND #9: - Lower Leg Wound Laterality: Right, Anterior, Distal Cleanser: Soap and Water 1 x Per Week/30 Days Discharge Instructions: May shower and wash wound with dial antibacterial soap and water prior to dressing change. Cleanser: Wound Cleanser 1 x Per Week/30 Days Discharge  Instructions: Cleanse the wound with wound cleanser prior to applying a clean dressing using gauze sponges, not tissue or cotton balls. Peri-Wound Care: Sween Lotion (Moisturizing lotion) 1 x Per Week/30 Days Discharge Instructions: Apply moisturizing lotion as directed Prim Dressing: Promogran Prisma Matrix, 4.34 (sq in) (silver collagen) 1 x Per Week/30 Days ary Discharge Instructions: Moisten collagen with saline or hydrogel Secondary Dressing: ABD Pad, 8x10 1 x Per Week/30 Days Haldeman, Azhia L ([id number]) [id number] Page 8 of 9 Discharge Instructions: Apply over primary dressing as directed. Compression Wrap: Kerlix Roll 4.5x3.1 (in/yd) 1 x Per Week/30 Days Discharge Instructions: Apply Kerlix and Coban compression as directed. Compression Wrap: Coban Self-Adherent Wrap 4x5 (in/yd) 1 x Per Week/30 Days Discharge Instructions: Apply over Kerlix as directed. 06/19/2023: The proximal ulcer has just a tiny bit of scab on the surface. The distal ulcer has a little bit less eschar present today, but the tendon that is exposed is grayish in color. The wound on her right lateral foot is superficial with a little eschar and slough present. She continues to complain of neuropathic pain, but has not discussed her  gabapentin dosage with her PCP. I used a curette to debride eschar and slough from the lateral foot wound. I debrided slough and subcutaneous tissue with a curette and nonviable tendon with forceps and scissors from her anterior tibial wound. I did not remove the scab from the proximal lower leg wound. We will continue Prisma silver collagen with Kerlix and Coban wrap to all sites. I strongly encouraged her to contact her PCPs office to discuss her gabapentin dosage, as the patient complains that the pain is so severe that she is crying and cannot walk normally. Follow-up with me in 1 week. Electronic Signature(s) Signed: 06/19/2023 12:38:46 PM By: Duanne Guess  MD FACS Entered By: Duanne Guess on 06/19/2023 08:43:45 -------------------------------------------------------------------------------- SuperBill Details Patient Name: Date of Service: [name] 06/19/2023 Medical Record Number: [id number] Patient Account Number: [id number] Date of Birth/Sex: Treating RN: 06-05-1956 (67 y.o. F) Primary Care Provider: Alfredo Martinez Other Clinician: Referring Provider: Treating Provider/Extender: Dan Europe, [name] in Treatment: 6 Diagnosis Coding ICD-10 Codes Code Description [phone] Non-pressure chronic ulcer of other part of right lower leg with fat layer exposed L97.815 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 without evidence of necrosis L97.512 Non-pressure chronic ulcer of other part of right foot with fat layer exposed E11.622 Type 2 diabetes mellitus with other skin ulcer R60.0 Localized edema Facility Procedures : 3 CPT4 Code Description: [id number] 11043 - DEB MUSC/FASCIA 20 SQ CM/< ICD-10 Diagnosis Description L97.815 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 Modifier: without evidence of Quantity: 1 necrosis : 7 CPT4 Code Description: [id number] 97597 - DEBRIDE WOUND 1ST 20 SQ CM OR < ICD-10 Diagnosis Description L97.512 Non-pressure chronic ulcer of other part of right foot with fat layer exposed Modifier: Quantity: 1 Physician Procedures : CPT4 Code Description Modifier [id number] 99214 - WC PHYS LEVEL 4 - EST PT 25 ICD-10 Diagnosis Description L97.812 Non-pressure chronic ulcer of other part of right lower leg with fat layer exposed L97.815 Non-pressure chronic ulcer of other part of right  lower leg with muscle involvement without evidence of L97.512 Non-pressure chronic ulcer of other part of right foot with fat layer exposed E11.622 Type 2 diabetes mellitus with other skin ulcer Quantity: 1 necrosis : [id number] 11043 - WC PHYS  DEBR MUSCLE/FASCIA 20 SQ CM ICD-10 Diagnosis Description JULIANN, INACIO ([id number]) [id number] [phone] 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 without  evidence of necros Quantity: 1 Page 9 of 9 is : [id number] 97597 - WC PHYS DEBR WO ANESTH 20 SQ CM 1 ICD-10 Diagnosis Description L97.512 Non-pressure chronic ulcer of other part of right foot with fat layer exposed Quantity: Electronic Signature(s) Signed: 06/19/2023 12:38:46 PM By: Duanne Guess MD FACS Entered By: Duanne Guess on 06/19/2023 08:44:09

## 2023-06-20 NOTE — Telephone Encounter (Signed)
Patient stated that she will just follow up about her bp on the day that she see [facility] which is 01/07. She stated if she feels weird or feel like "something is wrong" she will call to schedule a nurse visit.

## 2023-06-23 ENCOUNTER — Telehealth: Payer: Self-pay

## 2023-06-23 ENCOUNTER — Encounter: Payer: Self-pay | Admitting: Oncology

## 2023-06-23 ENCOUNTER — Other Ambulatory Visit (HOSPITAL_COMMUNITY): Payer: Self-pay

## 2023-06-23 NOTE — Progress Notes (Unsigned)
Pharmacy Medication Assistance Program Note    06/23/2023  Patient ID: [name], female   DOB: 1955-09-24, 67 y.o.   MRN: [id number]     06/23/2023  Outreach [facility]  Manufacturer Medication One [employer] [facility] Ozempic  Dose of Ozempic 2MG   Type of Radiographer, therapeutic Assistance  Date Application Submitted to [employer] 06/23/2023  Method Application Sent to [employer]     Submitted patient portion online.  PCP pgs will be placed in box asap.

## 2023-06-25 ENCOUNTER — Other Ambulatory Visit: Payer: Self-pay | Admitting: Student

## 2023-06-25 DIAGNOSIS — R4586 Emotional lability: Secondary | ICD-10-CM

## 2023-06-27 ENCOUNTER — Encounter (HOSPITAL_BASED_OUTPATIENT_CLINIC_OR_DEPARTMENT_OTHER): Payer: Medicare Other | Attending: General Surgery | Admitting: General Surgery

## 2023-06-27 DIAGNOSIS — R600 Localized edema: Secondary | ICD-10-CM | POA: Insufficient documentation

## 2023-06-27 DIAGNOSIS — C44722 Squamous cell carcinoma of skin of right lower limb, including hip: Secondary | ICD-10-CM | POA: Diagnosis not present

## 2023-06-27 DIAGNOSIS — E11621 Type 2 diabetes mellitus with foot ulcer: Secondary | ICD-10-CM | POA: Insufficient documentation

## 2023-06-27 DIAGNOSIS — E11622 Type 2 diabetes mellitus with other skin ulcer: Secondary | ICD-10-CM | POA: Diagnosis not present

## 2023-06-27 DIAGNOSIS — L97512 Non-pressure chronic ulcer of other part of right foot with fat layer exposed: Secondary | ICD-10-CM | POA: Diagnosis not present

## 2023-06-27 DIAGNOSIS — L97815 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 without evidence of necrosis: Secondary | ICD-10-CM | POA: Insufficient documentation

## 2023-06-27 DIAGNOSIS — L97812 Non-pressure chronic ulcer of other part of right lower leg with fat layer exposed: Secondary | ICD-10-CM | POA: Diagnosis not present

## 2023-06-27 DIAGNOSIS — I872 Venous insufficiency (chronic) (peripheral): Secondary | ICD-10-CM | POA: Diagnosis not present

## 2023-06-27 NOTE — Progress Notes (Signed)
 [name] ([id number]) [id number] Page 1 of 8 Visit Report for 06/27/2023 Chief Complaint Document Details Patient Name: Date of Service: [name], [name] 06/27/2023 9:15 A M Medical Record Number: [id number] Patient Account Number: [id number] Date of Birth/Sex: Treating RN: September 07, 1955 (68 y.o. F) Primary Care Provider: Bryan [name] Other Clinician: Referring Provider: Treating Provider/Extender: [name], [name] in Treatment: 8 Information Obtained from: Patient Chief Complaint Patient presents for treatment of an open ulcer due to venous insufficiency Electronic Signature(s) Signed: 06/27/2023 10:22:04 AM By: [name] Delon MD FACS Entered By: [name] on 06/27/2023 10:22:03 -------------------------------------------------------------------------------- Debridement Details Patient Name: Date of Service: [name] 06/27/2023 9:15 A M Medical Record Number: [id number] Patient Account Number: [id number] Date of Birth/Sex: Treating RN: 12-Jul-1955 (68 y.o. [name] [name] Primary Care Provider: Bryan [name] Other Clinician: Referring Provider: Treating Provider/Extender: [name], [name] in Treatment: 8 Debridement Performed for Assessment: Wound #9 Right,Distal,Anterior Lower Leg Performed By: Physician [name] Delon, MD The following information was scribed by: [name] The information was scribed for: [name] Debridement Type: Debridement Severity of Tissue Pre Debridement: Fat layer exposed Level of Consciousness (Pre-procedure): Awake and Alert Pre-procedure Verification/Time Out Yes - 10:00 Taken: Start Time: 10:01 Pain Control: Lidocaine  4% T opical Solution Percent of Wound Bed Debrided: 100% T Area Debrided (cm): otal 1.18 Tissue and other material debrided: Viable, Non-Viable, Eschar, Slough, Subcutaneous, Slough Level: Skin/Subcutaneous Tissue Debridement  Description: Excisional Instrument: Curette Bleeding: Minimum Hemostasis Achieved: Pressure Procedural Pain: 2 Post Procedural Pain: 1 Response to Treatment: Procedure was tolerated well Level of Consciousness (Post- Awake and Alert procedure): Post Debridement Measurements of Total Wound Length: (cm) 1.5 Width: (cm) 1 Depth: (cm) 0.1 Volume: (cm) 0.118 [name], [name] [id number] Page 2 of 8 Character of Wound/Ulcer Post Debridement: Improved Severity of Tissue Post Debridement: Fat layer exposed Post Procedure Diagnosis Same as Pre-procedure Electronic Signature(s) Signed: 06/27/2023 12:14:34 PM By: [name] Delon MD FACS Signed: 06/27/2023 12:47:24 PM By: [name] Handing RN, BSN Entered By: [name] on 06/27/2023 10:04:36 -------------------------------------------------------------------------------- Debridement Details Patient Name: Date of Service: [name], [name]. 06/27/2023 9:15 A M Medical Record Number: [id number] Patient Account Number: [id number] Date of Birth/Sex: Treating RN: 1955/09/22 (68 y.o. [name] [name] Primary Care Provider: Bryan [name] Other Clinician: Referring Provider: Treating Provider/Extender: [name], [name] in Treatment: 8 Debridement Performed for Assessment: Wound #10 Right,Lateral Foot Performed By: Physician [name] Delon, MD The following information was scribed by: [name] The information was scribed for: [name] Debridement Type: Debridement Severity of Tissue Pre Debridement: Fat layer exposed Level of Consciousness (Pre-procedure): Awake and Alert Pre-procedure Verification/Time Out Yes - 10:00 Taken: Start Time: 10:01 Pain Control: Lidocaine  4% T opical Solution Percent of Wound Bed Debrided: 100% T Area Debrided (cm): otal 0.22 Tissue and other material debrided: Viable, Non-Viable, Slough, Subcutaneous, Slough Level: Skin/Subcutaneous  Tissue Debridement Description: Excisional Instrument: Curette Bleeding: Minimum Hemostasis Achieved: Pressure Procedural Pain: 2 Post Procedural Pain: 1 Response to Treatment: Procedure was tolerated well Level of Consciousness (Post- Awake and Alert procedure): Post Debridement Measurements of Total Wound Length: (cm) 0.7 Width: (cm) 0.4 Depth: (cm) 0.1 Volume: (cm) 0.022 Character of Wound/Ulcer Post Debridement: Improved Severity of Tissue Post Debridement: Fat layer exposed Post Procedure Diagnosis Same as Pre-procedure Electronic Signature(s) Signed: 06/27/2023 12:14:34 PM By: [name] Delon MD FACS Signed: 06/27/2023 12:47:24 PM By: [name] Handing RN, BSN Entered By: [name] on 06/27/2023 10:05:10 [name] [id number] Page 3 of  8 -------------------------------------------------------------------------------- HPI Details Patient Name: Date of Service: [name], [name] 06/27/2023 9:15 A M Medical Record Number: [id number] Patient Account Number: [id number] Date of Birth/Sex: Treating RN: April 02, 1956 (68 y.o. F) Primary Care Provider: Bryan [name] Other Clinician: Referring Provider: Treating Provider/Extender: [name], [name] in Treatment: 8 History of Present Illness HPI Description: ADMISSION 05/02/2023 ***ABIs R: 0.88, palpable pulses*** This is a 68 year old woman with well-controlled type 2 diabetes (last hemoglobin A1c 5.4%) presenting to the wound care center with two ulcers on her right lower leg. She saw her primary care provider earlier this week and per their note, she has had lower leg swelling and erythema for about 2 months. Her PCP prescribed Augmentin  and doxycycline . 05/12/2023: Both wounds measured smaller today. There is some slough and eschar accumulation on each. She saw her PCP who removed our wraps for some reason and therefore her edema control is not ideal. 05/20/2023: The  proximal wound measured smaller today. The distal wound measured slightly larger, but this does incorporate a fair amount of scab/eschar. Edema control is much better this week. 12/3; the patient was in for a nurse visit to change her dressings today. I was asked to look at the wound which had a large dried eschar over the surface on the right anterior lower leg I suspect this is mostly dried blood from last week. 06/13/2023: The proximal ulcer is nearly healed. The distal ulcer has a large eschar over it with a much smaller underlying actual wound. T endon is exposed at the base of the wound. She also has a new wound on her right lateral foot. It looks as if it started out as a blister that subsequently ruptured. She is complaining of more neuropathic-type pain in both of her feet. 06/19/2023: The proximal ulcer has just a tiny bit of scab on the surface. The distal ulcer has a little bit less eschar present today, but the tendon that is exposed is grayish in color. The wound on her right lateral foot is superficial with a little eschar and slough present. She continues to complain of neuropathic pain, but has not discussed her gabapentin  dosage with her PCP. 06/27/2023: The proximal ulcer is healed. The distal ulcer has less crusty dark eschar present. The exposed tendon looks healthier today. There is still a fair amount of slough accumulation on this wound. The wound on her right lateral foot has some slough accumulation but is much less tender. Electronic Signature(s) Signed: 06/27/2023 10:22:56 AM By: [name] Delon MD FACS Entered By: [name] on 06/27/2023 10:22:56 -------------------------------------------------------------------------------- Physical Exam Details Patient Name: Date of Service: [name], [name] 06/27/2023 9:15 A M Medical Record Number: [id number] Patient Account Number: [id number] Date of Birth/Sex: Treating RN: 04-Aug-1955 (68 y.o. F) Primary Care Provider: Bryan [name] Other Clinician: Referring Provider: Treating Provider/Extender: [name], [name] in Treatment: 8 Constitutional Slightly hypertensive. . . . no acute distress. Respiratory Normal work of breathing on room air.. Notes 06/27/2023: The proximal ulcer is healed. The distal ulcer has less crusty dark eschar present. The exposed tendon looks healthier today. There is still a fair amount of slough accumulation on this wound. The wound on her right lateral foot has some slough accumulation but is much less tender. Electronic Signature(s) Signed: 06/27/2023 10:23:29 AM By: [name] Delon MD FACS [name] [id number] Page 4 of 8 Signed: 06/27/2023 10:23:29 AM By: [name] Delon MD FACS Entered By: [name] on 06/27/2023 10:23:29 -------------------------------------------------------------------------------- Physician Orders Details Patient  Name: Date of Service: [name], [name]. 06/27/2023 9:15 A M Medical Record Number: [id number] Patient Account Number: [id number] Date of Birth/Sex: Treating RN: [birth year] (68 y.o. [name] Aver, [name] Primary Care Provider: Bryan [name] Other Clinician: Referring Provider: Treating Provider/Extender: [name], [name] in Treatment: 8 The following information was scribed by: Aver [name] The information was scribed for: [name] Verbal / Phone Orders: No Diagnosis Coding ICD-10 Coding Code Description L97.815 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 without evidence of necrosis L97.512 Non-pressure chronic ulcer of other part of right foot with fat layer exposed E11.622 Type 2 diabetes mellitus with other skin ulcer R60.0 Localized edema Follow-up Appointments ppointment in 1 week. - Dr. Marolyn Return A 1/10 @ 10:30 am Anesthetic (In clinic) Topical Lidocaine  4% applied to wound bed Bathing/  Shower/ Hygiene May shower with protection but do not get wound dressing(s) wet. Protect dressing(s) with water  repellant cover (for example, large plastic bag) or a cast cover and may then take shower. Edema Control - Orders / Instructions Elevate legs to the level of the heart or above for 30 minutes daily and/or when sitting for 3-4 times a day throughout the day. Avoid standing for long periods of time. Exercise regularly Wound Treatment Wound #10 - Foot Wound Laterality: Right, Lateral Peri-Wound Care: Sween Lotion (Moisturizing lotion) 1 x Per Week Discharge Instructions: Apply moisturizing lotion as directed Prim Dressing: Promogran Prisma Matrix, 4.34 (sq in) (silver collagen) 1 x Per Week ary Discharge Instructions: Moisten collagen with saline or hydrogel Secondary Dressing: Optifoam Non-Adhesive Dressing, 4x4 in 1 x Per Week Discharge Instructions: Apply over primary dressing cut to make foam donut Secondary Dressing: Woven Gauze Sponges 2x2 in 1 x Per Week Discharge Instructions: Apply over primary dressing as directed. Secured With: 84M Medipore H Soft Cloth Surgical T ape, 4 x 10 (in/yd) 1 x Per Week Discharge Instructions: Secure with tape as directed. Wound #9 - Lower Leg Wound Laterality: Right, Anterior, Distal Cleanser: Soap and Water  1 x Per Week/30 Days Discharge Instructions: May shower and wash wound with dial antibacterial soap and water  prior to dressing change. Cleanser: Wound Cleanser 1 x Per Week/30 Days Discharge Instructions: Cleanse the wound with wound cleanser prior to applying a clean dressing using gauze sponges, not tissue or cotton balls. Peri-Wound Care: Sween Lotion (Moisturizing lotion) 1 x Per Week/30 Days Discharge Instructions: Apply moisturizing lotion as directed Prim Dressing: Promogran Prisma Matrix, 4.34 (sq in) (silver collagen) ary 1 x Per Week/30 Days [name], [name] [phone].pdf Page 5 of  8 Discharge Instructions: Moisten collagen with saline or hydrogel Secondary Dressing: ABD Pad, 8x10 1 x Per Week/30 Days Discharge Instructions: Apply over primary dressing as directed. Compression Wrap: Kerlix Roll 4.5x3.1 (in/yd) 1 x Per Week/30 Days Discharge Instructions: Apply Kerlix and Coban compression as directed. Compression Wrap: Coban Self-Adherent Wrap 4x5 (in/yd) 1 x Per Week/30 Days Discharge Instructions: Apply over Kerlix as directed. Electronic Signature(s) Signed: 06/27/2023 12:14:34 PM By: [name] Delon MD FACS Entered By: [name] on 06/27/2023 10:26:48 -------------------------------------------------------------------------------- Problem List Details Patient Name: Date of Service: [name], [name] 06/27/2023 9:15 A M Medical Record Number: [id number] Patient Account Number: [id number] Date of Birth/Sex: Treating RN: 24-May-1956 (68 y.o. F) Primary Care Provider: Bryan [name] Other Clinician: Referring Provider: Treating Provider/Extender: [name], [name] in Treatment: 8 Active Problems ICD-10 Encounter Code Description Active Date MDM Diagnosis L97.815 Non-pressure chronic ulcer of other part of right lower  leg with muscle 06/13/2023 No Yes involvement without evidence of necrosis L97.512 Non-pressure chronic ulcer of other part of right foot with fat layer exposed 06/13/2023 No Yes E11.622 Type 2 diabetes mellitus with other skin ulcer 05/02/2023 No Yes R60.0 Localized edema 05/02/2023 No Yes Inactive Problems ICD-10 Code Description Active Date Inactive Date L97.812 Non-pressure chronic ulcer of other part of right lower leg with fat layer exposed 05/02/2023 05/02/2023 Resolved Problems Electronic Signature(s) Signed: 06/27/2023 10:21:47 AM By: [name] Nest MD FACS Entered By: [name] Nest on 06/27/2023 10:21:47 [name], [name] [id number] Page 6 of  8 -------------------------------------------------------------------------------- Progress Note Details Patient Name: Date of Service: [name], [name] 06/27/2023 9:15 A M Medical Record Number: [id number] Patient Account Number: [id number] Date of Birth/Sex: Treating RN: 01/06/1956 (68 y.o. F) Primary Care Provider: Bryan [name] Other Clinician: Referring Provider: Treating Provider/Extender: [name] Nest [name], [name] in Treatment: 8 Subjective Chief Complaint Information obtained from Patient Patient presents for treatment of an open ulcer due to venous insufficiency History of Present Illness (HPI) ADMISSION 05/02/2023 ***ABIs R: 0.88, palpable pulses*** This is a 68 year old woman with well-controlled type 2 diabetes (last hemoglobin A1c 5.4%) presenting to the wound care center with two ulcers on her right lower leg. She saw her primary care provider earlier this week and per their note, she has had lower leg swelling and erythema for about 2 months. Her PCP prescribed Augmentin  and doxycycline . 05/12/2023: Both wounds measured smaller today. There is some slough and eschar accumulation on each. She saw her PCP who removed our wraps for some reason and therefore her edema control is not ideal. 05/20/2023: The proximal wound measured smaller today. The distal wound measured slightly larger, but this does incorporate a fair amount of scab/eschar. Edema control is much better this week. 12/3; the patient was in for a nurse visit to change her dressings today. I was asked to look at the wound which had a large dried eschar over the surface on the right anterior lower leg I suspect this is mostly dried blood from last week. 06/13/2023: The proximal ulcer is nearly healed. The distal ulcer has a large eschar over it with a much smaller underlying actual wound. T endon is exposed at the base of the wound. She also has a new wound on her right lateral foot. It looks as if it  started out as a blister that subsequently ruptured. She is complaining of more neuropathic-type pain in both of her feet. 06/19/2023: The proximal ulcer has just a tiny bit of scab on the surface. The distal ulcer has a little bit less eschar present today, but the tendon that is exposed is grayish in color. The wound on her right lateral foot is superficial with a little eschar and slough present. She continues to complain of neuropathic pain, but has not discussed her gabapentin  dosage with her PCP. 06/27/2023: The proximal ulcer is healed. The distal ulcer has less crusty dark eschar present. The exposed tendon looks healthier today. There is still a fair amount of slough accumulation on this wound. The wound on her right lateral foot has some slough accumulation but is much less tender. Objective Constitutional Slightly hypertensive. no acute distress. Vitals Time Taken: 9:27 AM, Height: 70 in, Weight: 166 lbs, BMI: 23.8, Temperature: 97.9 F, Pulse: 78 bpm, Respiratory Rate: 18 breaths/min, Blood Pressure: 148/64 mmHg. Respiratory Normal work of breathing on room air.. General Notes: 06/27/2023: The proximal ulcer is healed. The distal ulcer has less crusty dark eschar present.  The exposed tendon looks healthier today. There is still a fair amount of slough accumulation on this wound. The wound on her right lateral foot has some slough accumulation but is much less tender. Integumentary (Hair, Skin) Wound #10 status is Open. Original cause of wound was Gradually Appeared. The date acquired was: 06/13/2023. The wound has been in treatment 2 [name]. The wound is located on the Right,Lateral Foot. The wound measures 0.7cm length x 0.4cm width x 0.1cm depth; 0.22cm^2 area and 0.022cm^3 volume. There is Fat Layer (Subcutaneous Tissue) exposed. There is no tunneling or undermining noted. There is a medium amount of serosanguineous drainage noted. The wound margin is fibrotic, thickened scar. There is  small (1-33%) granulation within the wound bed. There is a large (67-100%) amount of necrotic tissue within the wound bed including Adherent Slough. The periwound skin appearance had no abnormalities noted for moisture. The periwound skin appearance had no abnormalities noted for color. The periwound skin appearance exhibited: Callus. The periwound skin appearance did not exhibit: Crepitus, Excoriation, Induration, Rash, Scarring. Periwound temperature was noted as No Abnormality. Wound #9 status is Open. Original cause of wound was Trauma. The date acquired was: 04/10/2023. The wound has been in treatment 8 [name]. The wound is located on the Right,Distal,Anterior Lower Leg. The wound measures 1.5cm length x 1cm width x 0.1cm depth; 1.178cm^2 area and 0.118cm^3 volume. There is Fat Layer (Subcutaneous Tissue) exposed. There is no tunneling or undermining noted. There is a medium amount of serosanguineous drainage noted. The [name], [name] ([id number]) [id number] Page 7 of 8 wound margin is fibrotic, thickened scar. There is medium (34-66%) red granulation within the wound bed. There is a medium (34-66%) amount of necrotic tissue within the wound bed including Eschar and Adherent Slough. The periwound skin appearance had no abnormalities noted for texture. The periwound skin appearance had no abnormalities noted for moisture. The periwound skin appearance had no abnormalities noted for color. Periwound temperature was noted as No Abnormality. Assessment Active Problems ICD-10 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 without evidence of necrosis Non-pressure chronic ulcer of other part of right foot with fat layer exposed Type 2 diabetes mellitus with other skin ulcer Localized edema Procedures Wound #10 Pre-procedure diagnosis of Wound #10 is a Diabetic Wound/Ulcer of the Lower Extremity located on the Right,Lateral Foot  .Severity of Tissue Pre Debridement is: Fat layer exposed. There was a Excisional Skin/Subcutaneous Tissue Debridement with a total area of 0.22 sq cm performed by [name] Nest, MD. With the following instrument(s): Curette to remove Viable and Non-Viable tissue/material. Material removed includes Subcutaneous Tissue and Slough and after achieving pain control using Lidocaine  4% T opical Solution. No specimens were taken. A time out was conducted at 10:00, prior to the start of the procedure. A Minimum amount of bleeding was controlled with Pressure. The procedure was tolerated well with a pain level of 2 throughout and a pain level of 1 following the procedure. Post Debridement Measurements: 0.7cm length x 0.4cm width x 0.1cm depth; 0.022cm^3 volume. Character of Wound/Ulcer Post Debridement is improved. Severity of Tissue Post Debridement is: Fat layer exposed. Post procedure Diagnosis Wound #10: Same as Pre-Procedure Wound #9 Pre-procedure diagnosis of Wound #9 is a Venous Leg Ulcer located on the Right,Distal,Anterior Lower Leg .Severity of Tissue Pre Debridement is: Fat layer exposed. There was a Excisional Skin/Subcutaneous Tissue Debridement with a total area of 1.18 sq cm performed by [name] Nest, MD. With the following instrument(s):  Curette to remove Viable and Non-Viable tissue/material. Material removed includes Eschar, Subcutaneous Tissue, and Slough after achieving pain control using Lidocaine  4% Topical Solution. No specimens were taken. A time out was conducted at 10:00, prior to the start of the procedure. A Minimum amount of bleeding was controlled with Pressure. The procedure was tolerated well with a pain level of 2 throughout and a pain level of 1 following the procedure. Post Debridement Measurements: 1.5cm length x 1cm width x 0.1cm depth; 0.118cm^3 volume. Character of Wound/Ulcer Post Debridement is improved. Severity of Tissue Post Debridement is: Fat layer  exposed. Post procedure Diagnosis Wound #9: Same as Pre-Procedure Plan Follow-up Appointments: Return Appointment in 1 week. - Dr. Marolyn 1/10 @ 10:30 am Anesthetic: (In clinic) Topical Lidocaine  4% applied to wound bed Bathing/ Shower/ Hygiene: May shower with protection but do not get wound dressing(s) wet. Protect dressing(s) with water  repellant cover (for example, large plastic bag) or a cast cover and may then take shower. Edema Control - Orders / Instructions: Elevate legs to the level of the heart or above for 30 minutes daily and/or when sitting for 3-4 times a day throughout the day. Avoid standing for long periods of time. Exercise regularly WOUND #10: - Foot Wound Laterality: Right, Lateral Peri-Wound Care: Sween Lotion (Moisturizing lotion) 1 x Per Week/ Discharge Instructions: Apply moisturizing lotion as directed Prim Dressing: Promogran Prisma Matrix, 4.34 (sq in) (silver collagen) 1 x Per Week/ ary Discharge Instructions: Moisten collagen with saline or hydrogel Secondary Dressing: Optifoam Non-Adhesive Dressing, 4x4 in 1 x Per Week/ Discharge Instructions: Apply over primary dressing cut to make foam donut Secondary Dressing: Woven Gauze Sponges 2x2 in 1 x Per Week/ Discharge Instructions: Apply over primary dressing as directed. Secured With: 41M Medipore H Soft Cloth Surgical T ape, 4 x 10 (in/yd) 1 x Per Week/ Discharge Instructions: Secure with tape as directed. WOUND #9: - Lower Leg Wound Laterality: Right, Anterior, Distal Cleanser: Soap and Water  1 x Per Week/30 Days Discharge Instructions: May shower and wash wound with dial antibacterial soap and water  prior to dressing change. Cleanser: Wound Cleanser 1 x Per Week/30 Days Discharge Instructions: Cleanse the wound with wound cleanser prior to applying a clean dressing using gauze sponges, not tissue or cotton balls. Peri-Wound Care: Sween Lotion (Moisturizing lotion) 1 x Per Week/30 Days Discharge  Instructions: Apply moisturizing lotion as directed Prim Dressing: Promogran Prisma Matrix, 4.34 (sq in) (silver collagen) 1 x Per Week/30 Days ary Discharge Instructions: Moisten collagen with saline or hydrogel Secondary Dressing: ABD Pad, 8x10 1 x Per Week/30 Days Discharge Instructions: Apply over primary dressing as directed. Com pression Wrap: Kerlix Roll 4.5x3.1 (in/yd) 1 x Per Week/30 Days Discharge Instructions: Apply Kerlix and Coban compression as directed. [name], Suhaila [name] ([id number]) [id number] Page 8 of 8 Compression Wrap: Coban Self-Adherent Wrap 4x5 (in/yd) 1 x Per Week/30 Days Discharge Instructions: Apply over Kerlix as directed. 06/27/2023: The proximal ulcer is healed. The distal ulcer has less crusty dark eschar present. The exposed tendon looks healthier today. There is still a fair amount of slough accumulation on this wound. The wound on her right lateral foot has some slough accumulation but is much less tender. I used a curette to debride eschar, slough, and subcutaneous tissue from both of the wounds. We will continue Prisma silver collagen and Kerlix and Coban wrap. 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 I think this warrants a biopsy, but we will perform that at her visit next week. Electronic Signature(s) Signed: 06/27/2023 10:27:59 AM By: [name] Nest MD FACS Entered By: [name] Nest on 06/27/2023 10:27:59 -------------------------------------------------------------------------------- SuperBill Details Patient Name: Date of Service: SINDA, LEEDOM 06/27/2023 Medical Record Number: [id number] Patient Account Number: [id number] Date of Birth/Sex: Treating RN: 12-13-1955 (68 y.o. 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 E11.622 Type 2 diabetes mellitus with other skin ulcer R60.0 Localized edema Facility Procedures : 3 CPT4 Code: [id number] Description: 11042 - DEB SUBQ TISSUE 20 SQ CM/< ICD-10 Diagnosis Description L97.815 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 involvem L97.512 Non-pressure chronic ulcer of other part of right foot with fat layer exposed  E11.622 Type 2 diabetes mellitus with other skin ulcer R60.0 Localized edema Modifier: ent without evidence Quantity: 1 of necrosis Physician Procedures : CPT4 Code Description Modifier [id number] 11042 - WC PHYS SUBQ TISS 20 SQ CM ICD-10 Diagnosis Description L97.815 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 without evidence o L97.512 Non-pressure chronic ulcer of  other part of right foot with fat layer exposed E11.622 Type 2 diabetes mellitus with other skin ulcer R60.0 Localized edema Quantity: 1 f necrosis Electronic Signature(s) Signed: 06/27/2023 10:28:11 AM By: [name] Nest MD FACS Entered By: [name] Nest on 06/27/2023 10:28:11

## 2023-07-01 ENCOUNTER — Ambulatory Visit: Payer: Medicare Other | Admitting: Student

## 2023-07-04 ENCOUNTER — Encounter (HOSPITAL_BASED_OUTPATIENT_CLINIC_OR_DEPARTMENT_OTHER): Payer: Medicare Other | Admitting: General Surgery

## 2023-07-04 ENCOUNTER — Other Ambulatory Visit (HOSPITAL_BASED_OUTPATIENT_CLINIC_OR_DEPARTMENT_OTHER): Payer: Self-pay | Admitting: General Surgery

## 2023-07-04 DIAGNOSIS — L97512 Non-pressure chronic ulcer of other part of right foot with fat layer exposed: Secondary | ICD-10-CM | POA: Diagnosis not present

## 2023-07-04 DIAGNOSIS — L97812 Non-pressure chronic ulcer of other part of right lower leg with fat layer exposed: Secondary | ICD-10-CM | POA: Diagnosis not present

## 2023-07-04 DIAGNOSIS — D0470 Carcinoma in situ of skin of unspecified lower limb, including hip: Secondary | ICD-10-CM | POA: Diagnosis not present

## 2023-07-04 DIAGNOSIS — L97815 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 without evidence of necrosis: Secondary | ICD-10-CM | POA: Diagnosis not present

## 2023-07-04 DIAGNOSIS — I872 Venous insufficiency (chronic) (peripheral): Secondary | ICD-10-CM | POA: Diagnosis not present

## 2023-07-04 DIAGNOSIS — E11621 Type 2 diabetes mellitus with foot ulcer: Secondary | ICD-10-CM | POA: Diagnosis not present

## 2023-07-04 DIAGNOSIS — E11622 Type 2 diabetes mellitus with other skin ulcer: Secondary | ICD-10-CM | POA: Diagnosis not present

## 2023-07-04 DIAGNOSIS — L308 Other specified dermatitis: Secondary | ICD-10-CM | POA: Diagnosis not present

## 2023-07-04 DIAGNOSIS — R600 Localized edema: Secondary | ICD-10-CM | POA: Diagnosis not present

## 2023-07-04 DIAGNOSIS — C44722 Squamous cell carcinoma of skin of right lower limb, including hip: Secondary | ICD-10-CM | POA: Diagnosis not present

## 2023-07-08 LAB — DERMATOLOGY PATHOLOGY

## 2023-07-09 ENCOUNTER — Other Ambulatory Visit: Payer: Self-pay | Admitting: Cardiology

## 2023-07-09 NOTE — Progress Notes (Signed)
 [name], [name] ([id number]) [id number] Page 1 of 10 Visit Report for 07/04/2023 Biopsy Details Patient Name: Date of Service: [name], [name] 07/04/2023 10:30 A M Medical Record Number: [id number] Patient Account Number: [id number] Date of Birth/Sex: Treating RN: Aug 26, 1955 (68 y.o. [name] Primary Care Provider: Bryan Bianchi Other Clinician: Referring Provider: Treating Provider/Extender: Marolyn Delon Bryan, [name] in Treatment: 9 Biopsy Performed for: Wound #9 Right, Distal, Anterior Lower Leg The following information was scribed by: Drury [name] The information was scribed for: Marolyn Delon Location(s): Wound Bed, Wound Margin Performed By: Physician Marolyn Delon, MD Tissue Punch: Yes Size (mm): 5 Number of Specimens T aken: 3 Specimen Sent T Pathology: o Yes Level of Consciousness (Pre-procedure): Awake and Alert Pre-procedure Verification/Time-Out Taken: Yes - 10:55 Pain Control: Lidocaine  Injectable Lidocaine  Percent: 1% Instrument: Forceps, Scissors Bleeding: Moderate Hemostasis Achieved: Electrocautery Procedural Pain: 0 Post Procedural Pain: 0 Response to Treatment: Procedure was tolerated well Level of Consciousness (Post-procedure): Awake and Alert Post Procedure Diagnosis Same as Pre-procedure Electronic Signature(s) Signed: 07/04/2023 12:19:00 PM By: Marolyn Delon MD FACS Signed: 07/07/2023 4:48:15 PM By: Drury Nestle RN, BSN Entered By: Drury [name] on 07/04/2023 11:07:05 -------------------------------------------------------------------------------- Chief Complaint Document Details Patient Name: Date of Service: [name] 07/04/2023 10:30 A M Medical Record Number: [id number] Patient Account Number: [id number] Date of Birth/Sex: Treating RN: [birth year] (68 y.o. F) Primary Care Provider: Bryan Bianchi Other Clinician: Referring Provider: Treating Provider/Extender: Marolyn Delon Bryan,  [name] in Treatment: 9 Information Obtained from: Patient Chief Complaint Patient presents for treatment of an open ulcer due to venous insufficiency Electronic Signature(s) Signed: 07/04/2023 11:37:09 AM By: Marolyn Delon MD FACS Entered By: Marolyn Delon on 07/04/2023 11:37:09 Whelchel, Estefany [name] ([id number]) [id number] Page 2 of 10 -------------------------------------------------------------------------------- Debridement Details Patient Name: Date of Service: [name], [name] 07/04/2023 10:30 A M Medical Record Number: [id number] Patient Account Number: [id number] Date of Birth/Sex: Treating RN: 07/10/1955 (68 y.o. [name] Primary Care Provider: Bryan Bianchi Other Clinician: Referring Provider: Treating Provider/Extender: Marolyn Delon Bryan, [name] in Treatment: 9 Debridement Performed for Assessment: Wound #10 Right,Lateral Foot Performed By: Physician Marolyn Delon, MD The following information was scribed by: Drury [name] The information was scribed for: Marolyn Delon Debridement Type: Debridement Severity of Tissue Pre Debridement: Fat layer exposed Level of Consciousness (Pre-procedure): Awake and Alert Pre-procedure Verification/Time Out Yes - 10:45 Taken: Start Time: 10:46 Pain Control: Lidocaine  4% T opical Solution Percent of Wound Bed Debrided: 100% T Area Debrided (cm): otal 0.55 Tissue and other material debrided: Viable, Non-Viable, Callus, Slough, Subcutaneous, Slough Level: Skin/Subcutaneous Tissue Debridement Description: Excisional Instrument: Curette Bleeding: Moderate Hemostasis Achieved: Pressure End Time: 10:55 Procedural Pain: 0 Post Procedural Pain: 0 Response to Treatment: Procedure was tolerated well Level of Consciousness (Post- Awake and Alert procedure): Post Debridement Measurements of Total Wound Length: (cm) 1 Width: (cm) 0.7 Depth: (cm) 0.1 Volume: (cm) 0.055 Character of  Wound/Ulcer Post Debridement: Improved Severity of Tissue Post Debridement: Fat layer exposed Post Procedure Diagnosis Same as Pre-procedure Electronic Signature(s) Signed: 07/04/2023 12:19:00 PM By: Marolyn Delon MD FACS Signed: 07/07/2023 4:48:15 PM By: Drury Nestle RN, BSN Entered By: Drury [name] on 07/04/2023 11:04:47 -------------------------------------------------------------------------------- Debridement Details Patient Name: Date of Service: [name] ADRIEN [name]. 07/04/2023 10:30 A M Medical Record Number: [id number] Patient Account Number: [id number] Date of Birth/Sex: Treating RN: August 21, 1955 (68 y.o. [name] Primary Care Provider: Bryan Bianchi Other Clinician: Referring Provider: Treating Provider/Extender: Marolyn Delon Bryan, [name] in Treatment: 9 Debridement Performed for  Assessment: Wound #9 Right,Distal,Anterior Lower Leg [name], [name] ([id number]) [id number] Page 3 of 10 Performed By: Physician Marolyn Nest, MD The following information was scribed by: Drury [name] The information was scribed for: Marolyn Nest Debridement Type: Debridement Severity of Tissue Pre Debridement: Fat layer exposed Level of Consciousness (Pre-procedure): Awake and Alert Pre-procedure Verification/Time Out Yes - 10:45 Taken: Start Time: 10:46 Pain Control: Lidocaine  4% T opical Solution Percent of Wound Bed Debrided: 100% T Area Debrided (cm): otal 1.06 Tissue and other material debrided: Viable, Non-Viable, Callus, Eschar, Slough, Subcutaneous, Slough Level: Skin/Subcutaneous Tissue Debridement Description: Excisional Instrument: Curette Bleeding: Moderate Hemostasis Achieved: Pressure End Time: 10:55 Procedural Pain: 0 Post Procedural Pain: 0 Response to Treatment: Procedure was tolerated well Level of Consciousness (Post- Awake and Alert procedure): Post Debridement Measurements of Total Wound Length: (cm) 1.5 Width:  (cm) 0.9 Depth: (cm) 0.1 Volume: (cm) 0.106 Character of Wound/Ulcer Post Debridement: Improved Severity of Tissue Post Debridement: Fat layer exposed Post Procedure Diagnosis Same as Pre-procedure Electronic Signature(s) Signed: 07/04/2023 12:19:00 PM By: Marolyn Nest MD FACS Signed: 07/07/2023 4:48:15 PM By: Drury Nestle RN, BSN Entered By: Drury [name] on 07/04/2023 11:05:30 -------------------------------------------------------------------------------- HPI Details Patient Name: Date of Service: [name] 07/04/2023 10:30 A M Medical Record Number: [id number] Patient Account Number: [id number] Date of Birth/Sex: Treating RN: 06-13-1956 (68 y.o. F) Primary Care Provider: Bryan Bianchi Other Clinician: Referring Provider: Treating Provider/Extender: Marolyn Nest Bryan, [name] in Treatment: 9 History of Present Illness HPI Description: ADMISSION 05/02/2023 ***ABIs R: 0.88, palpable pulses*** This is a 68 year old woman with well-controlled type 2 diabetes (last hemoglobin A1c 5.4%) presenting to the wound care center with two ulcers on her right lower leg. She saw her primary care provider earlier this week and per their note, she has had lower leg swelling and erythema for about 2 months. Her PCP prescribed Augmentin  and doxycycline . 05/12/2023: Both wounds measured smaller today. There is some slough and eschar accumulation on each. She saw her PCP who removed our wraps for some reason and therefore her edema control is not ideal. 05/20/2023: The proximal wound measured smaller today. The distal wound measured slightly larger, but this does incorporate a fair amount of scab/eschar. Edema control is much better this week. 12/3; the patient was in for a nurse visit to change her dressings today. I was asked to look at the wound which had a large dried eschar over the surface on the right anterior lower leg I suspect this is mostly dried blood from last  week. Witts, Vaanya [name] ([id number]) [id number] Page 4 of 10 06/13/2023: The proximal ulcer is nearly healed. The distal ulcer has a large eschar over it with a much smaller underlying actual wound. T endon is exposed at the base of the wound. She also has a new wound on her right lateral foot. It looks as if it started out as a blister that subsequently ruptured. She is complaining of more neuropathic-type pain in both of her feet. 06/19/2023: The proximal ulcer has just a tiny bit of scab on the surface. The distal ulcer has a little bit less eschar present today, but the tendon that is exposed is grayish in color. The wound on her right lateral foot is superficial with a little eschar and slough present. She continues to complain of neuropathic pain, but has not discussed her gabapentin  dosage with her PCP. 06/27/2023: The proximal ulcer is healed. The distal ulcer has less crusty dark eschar present. The exposed tendon looks healthier today.  There is still a fair amount of slough accumulation on this wound. The wound on her right lateral foot has some slough accumulation but is much less tender. 07/04/2023: There is a fair amount of callus accumulation around the lateral foot ulcer. The surface of this wound is fairly pale. There is crusted dried blood around the anterior tibial ulcer. Tendon remains exposed at the base of the wound underneath a layer of slough. Electronic Signature(s) Signed: 07/04/2023 11:38:01 AM By: Marolyn Nest MD FACS Entered By: Marolyn Nest on 07/04/2023 11:38:00 -------------------------------------------------------------------------------- Physical Exam Details Patient Name: Date of Service: [name], FONG 07/04/2023 10:30 A M Medical Record Number: [id number] Patient Account Number: [id number] Date of Birth/Sex: Treating RN: July 09, 1955 (68 y.o. F) Primary Care Provider: Bryan Bianchi Other Clinician: Referring Provider: Treating  Provider/Extender: Marolyn Nest Bryan, [name] in Treatment: 9 Constitutional Hypertensive, asymptomatic. . . . no acute distress. Respiratory Normal work of breathing on room air.. Notes 07/04/2023: There is a fair amount of callus accumulation around the lateral foot ulcer. The surface of this wound is fairly pale. There is crusted dried blood around the anterior tibial ulcer. Tendon remains exposed at the base of the wound underneath a layer of slough. Electronic Signature(s) Signed: 07/04/2023 11:38:37 AM By: Marolyn Nest MD FACS Entered By: Marolyn Nest on 07/04/2023 11:38:36 -------------------------------------------------------------------------------- Physician Orders Details Patient Name: Date of Service: JENISA, MONTY 07/04/2023 10:30 A M Medical Record Number: [id number] Patient Account Number: [id number] Date of Birth/Sex: Treating RN: Aug 25, 1955 (68 y.o. 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 - Dr. Marolyn Return A ppointment in 2 weeks. - Dr. Marolyn Return A Return appointment in 3 weeks. - Dr. Marolyn Other: - biopsy once it results will call you. Anesthetic (In clinic) Topical Lidocaine  4% applied to wound bed Bathing/ Shower/  Hygiene May shower with protection but do not get wound dressing(s) wet. Protect dressing(s) with water  repellant cover (for example, large plastic bag) or a cast cover and may then take shower. Edema Control - Orders / Instructions Elevate legs to the level of the heart or above for 30 minutes daily and/or when sitting for 3-4 times a day throughout the day. Avoid standing for long periods of time. Exercise regularly Wound Treatment Wound #10 - Foot Wound Laterality: Right, Lateral Peri-Wound Care: Sween Lotion (Moisturizing lotion) 1 x Per Week Discharge Instructions: Apply moisturizing lotion as directed Prim Dressing: Promogran Prisma Matrix, 4.34 (sq in) (silver collagen) 1 x Per Week ary Discharge Instructions: Moisten collagen with saline or hydrogel Secondary Dressing: Optifoam Non-Adhesive Dressing, 4x4 in 1 x Per Week Discharge Instructions: Apply over primary dressing cut to make foam donut Secondary Dressing: Woven Gauze Sponges 2x2 in 1 x Per Week Discharge Instructions: Apply over primary dressing as directed. Secured With: 32M Medipore H Soft Cloth Surgical T ape, 4 x 10 (in/yd) 1 x Per Week Discharge Instructions: Secure with tape as directed. Wound #9 -  Lower Leg Wound Laterality: Right, Anterior, Distal Cleanser: Soap and Water  1 x Per Week/30 Days Discharge Instructions: May shower and wash wound with dial antibacterial soap and water  prior to dressing change. Cleanser: Wound Cleanser 1 x Per Week/30 Days Discharge Instructions: Cleanse the wound with wound cleanser prior to applying a clean dressing using gauze sponges, not tissue or cotton balls. Peri-Wound Care: Sween Lotion (Moisturizing lotion) 1 x Per Week/30 Days Discharge Instructions: Apply moisturizing lotion as directed Prim Dressing: Promogran Prisma Matrix, 4.34 (sq in) (silver collagen) 1 x Per Week/30 Days ary Discharge Instructions: Moisten collagen with saline or hydrogel Secondary Dressing: ABD Pad,  8x10 1 x Per Week/30 Days Discharge Instructions: Apply over primary dressing as directed. Compression Wrap: Kerlix Roll 4.5x3.1 (in/yd) 1 x Per Week/30 Days Discharge Instructions: Apply Kerlix and Coban compression as directed. Compression Wrap: Coban Self-Adherent Wrap 4x5 (in/yd) 1 x Per Week/30 Days Discharge Instructions: Apply over Kerlix as directed. Laboratory Bacteria identified in Tissue by Biopsy culture (MICRO) - right anterior lower leg wound. LOINC Code: 79525-6 Convenience Name: Biopsy specimen culture Electronic Signature(s) Signed: 07/04/2023 12:19:00 PM By: Marolyn Nest MD FACS Entered By: Marolyn Nest on 07/04/2023 11:42:55 Mccoin, ADRIEN [name] [id number] Page 6 of 10 Prescription 07/04/2023 -------------------------------------------------------------------------------- Marton, Pia [name]. Marolyn Nest MD Patient Name: Provider: [birth year] [id number] Date of Birth: NPI#: [id number] Sex: DEA #: [phone] Phone #: License #: ROBERTO: Patient Address: [address] [place] [facility] [place], [place] [address] [address] [place], [place] [address] [phone] Allergies cephalexin; latex; Westcort  (neomycin-polymx-HC); adhesive tape Provider's Orders Bacteria identified in Tissue by Biopsy culture - right anterior lower leg wound. LOINC Code: [id number] Convenience Name: Biopsy specimen culture Hand Signature: Date(s): Electronic Signature(s) Signed: 07/04/2023 12:19:00 PM By: Marolyn Nest MD FACS Entered By: Marolyn Nest on 07/04/2023 11:42:56 -------------------------------------------------------------------------------- Problem List Details Patient Name: Date of Service: [name] 07/04/2023 10:30 A M Medical Record Number: [id number] Patient Account Number: [id number] Date of Birth/Sex: Treating RN: 05/26/1956 (68 y.o. [name] Pontes, Geminus.gell Primary  Care Provider: Bryan Bianchi Other Clinician: Referring Provider: Treating Provider/Extender: Marolyn Nest Bryan, [name] in Treatment: 9 Active Problems ICD-10 Encounter Code Description Active Date MDM Diagnosis L97.815 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 06/13/2023 No Yes involvement without evidence of necrosis L97.512 Non-pressure chronic ulcer of other part of right foot with fat layer exposed 06/13/2023 No Yes E11.622 Type 2 diabetes mellitus with other skin ulcer 05/02/2023 No Yes R60.0 Localized edema 05/02/2023 No Yes Sitzman, [name] [id number] Page 7 of 10 Inactive Problems ICD-10 Code Description Active Date Inactive Date L97.812 Non-pressure chronic ulcer of other part of right lower leg with fat layer exposed 05/02/2023 05/02/2023 Resolved Problems Electronic Signature(s) Signed: 07/04/2023 11:36:44 AM By: Marolyn Nest MD FACS Entered By: Marolyn Nest on 07/04/2023 11:36:43 -------------------------------------------------------------------------------- Progress Note Details Patient Name: Date of Service: [name] ADRIEN [name]. 07/04/2023 10:30 A M Medical Record Number: [id number] Patient Account Number: [id number] Date of Birth/Sex: Treating RN: Oct 23, 1955 (68 y.o. F) Primary Care Provider: Bryan Bianchi Other Clinician: Referring Provider: Treating Provider/Extender: Marolyn Nest Bryan, [name] in Treatment: 9 Subjective Chief Complaint Information obtained from Patient Patient presents for treatment of an open ulcer due to venous insufficiency History of Present Illness (HPI) ADMISSION 05/02/2023 ***ABIs R: 0.88, palpable pulses*** This is a 68 year old woman with well-controlled type 2 diabetes (last hemoglobin A1c 5.4%) presenting to the wound care center  with two ulcers on her right lower leg. She saw her primary care provider earlier this week and per their  note, she has had lower leg swelling and erythema for about 2 months. Her PCP prescribed Augmentin  and doxycycline . 05/12/2023: Both wounds measured smaller today. There is some slough and eschar accumulation on each. She saw her PCP who removed our wraps for some reason and therefore her edema control is not ideal. 05/20/2023: The proximal wound measured smaller today. The distal wound measured slightly larger, but this does incorporate a fair amount of scab/eschar. Edema control is much better this week. 12/3; the patient was in for a nurse visit to change her dressings today. I was asked to look at the wound which had a large dried eschar over the surface on the right anterior lower leg I suspect this is mostly dried blood from last week. 06/13/2023: The proximal ulcer is nearly healed. The distal ulcer has a large eschar over it with a much smaller underlying actual wound. T endon is exposed at the base of the wound. She also has a new wound on her right lateral foot. It looks as if it started out as a blister that subsequently ruptured. She is complaining of more neuropathic-type pain in both of her feet. 06/19/2023: The proximal ulcer has just a tiny bit of scab on the surface. The distal ulcer has a little bit less eschar present today, but the tendon that is exposed is grayish in color. The wound on her right lateral foot is superficial with a little eschar and slough present. She continues to complain of neuropathic pain, but has not discussed her gabapentin  dosage with her PCP. 06/27/2023: The proximal ulcer is healed. The distal ulcer has less crusty dark eschar present. The exposed tendon looks healthier today. There is still a fair amount of slough accumulation on this wound. The wound on her right lateral foot has some slough accumulation but is much less tender. 07/04/2023: There is a fair amount of callus accumulation around the lateral foot ulcer. The surface of this wound is fairly  pale. There is crusted dried blood around the anterior tibial ulcer. Tendon remains exposed at the base of the wound underneath a layer of slough. Objective Goulart, Dayannara [name] ([id number]) [id number] Page 8 of 10 Constitutional Hypertensive, asymptomatic. no acute distress. Vitals Time Taken: 10:34 AM, Height: 70 in, Weight: 166 lbs, BMI: 23.8, Temperature: 97.8 F, Pulse: 73 bpm, Respiratory Rate: 18 breaths/min, Blood Pressure: 156/62 mmHg. Respiratory Normal work of breathing on room air.. General Notes: 07/04/2023: There is a fair amount of callus accumulation around the lateral foot ulcer. The surface of this wound is fairly pale. There is crusted dried blood around the anterior tibial ulcer. Tendon remains exposed at the base of the wound underneath a layer of slough. Integumentary (Hair, Skin) Wound #10 status is Open. Original cause of wound was Gradually Appeared. The date acquired was: 06/13/2023. The wound has been in treatment 3 weeks. The wound is located on the Right,Lateral Foot. The wound measures 1cm length x 0.7cm width x 0.1cm depth; 0.55cm^2 area and 0.055cm^3 volume. There is Fat Layer (Subcutaneous Tissue) exposed. There is no tunneling or undermining noted. There is a medium amount of serosanguineous drainage noted. The wound margin is fibrotic, thickened scar. There is no granulation within the wound bed. There is a large (67-100%) amount of necrotic tissue within the wound bed including Adherent Slough. The periwound skin appearance had no abnormalities noted for moisture. The periwound  skin appearance had no abnormalities noted for color. The periwound skin appearance exhibited: Callus. The periwound skin appearance did not exhibit: Crepitus, Excoriation, Induration, Rash, Scarring. Periwound temperature was noted as No Abnormality. Wound #9 status is Open. Original cause of wound was Trauma. The date acquired was: 04/10/2023. The wound has been in  treatment 9 weeks. The wound is located on the Right,Distal,Anterior Lower Leg. The wound measures 1.5cm length x 0.9cm width x 0.1cm depth; 1.06cm^2 area and 0.106cm^3 volume. There is Fat Layer (Subcutaneous Tissue) exposed. There is no tunneling or undermining noted. There is a medium amount of serosanguineous drainage noted. The wound margin is fibrotic, thickened scar. There is large (67-100%) red granulation within the wound bed. There is a small (1-33%) amount of necrotic tissue within the wound bed including Eschar and Adherent Slough. The periwound skin appearance had no abnormalities noted for texture. The periwound skin appearance had no abnormalities noted for moisture. The periwound skin appearance had no abnormalities noted for color. Periwound temperature was noted as No Abnormality. 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 There was a Excisional Skin/Subcutaneous Tissue Debridement with a total area of 0.55 sq cm performed by Marolyn Nest, MD. With the following instrument(s): Curette to remove Viable and Non-Viable tissue/material. Material removed includes Callus, Subcutaneous Tissue, and Slough after achieving pain control using Lidocaine  4% Topical Solution. A time out was conducted at 10:45, prior to the start of the procedure. A Moderate amount of bleeding was controlled with Pressure. The procedure was tolerated well with a pain level of 0 throughout and a pain level of 0 following the procedure. Post Debridement Measurements: 1cm  length x 0.7cm width x 0.1cm depth; 0.055cm^3 volume. Character of Wound/Ulcer Post Debridement is improved. Severity of Tissue Post Debridement is: Fat layer exposed. Post procedure Diagnosis Wound #10: Same as Pre-Procedure Wound #9 Pre-procedure diagnosis of Wound #9 is a Venous Leg Ulcer located on the Right,Distal,Anterior Lower Leg .Severity of Tissue Pre Debridement is: Fat layer exposed. There was a Excisional Skin/Subcutaneous Tissue Debridement with a total area of 1.06 sq cm performed by Marolyn Nest, MD. With the following instrument(s): Curette to remove Viable and Non-Viable tissue/material. Material removed includes Eschar, Callus, Subcutaneous Tissue, and Slough after achieving pain control using Lidocaine  4% Topical Solution. A time out was conducted at 10:45, prior to the start of the procedure. A Moderate amount of bleeding was controlled with Pressure. The procedure was tolerated well with a pain level of 0 throughout and a pain level of 0 following the procedure. Post Debridement Measurements: 1.5cm length x 0.9cm width x 0.1cm depth; 0.106cm^3 volume. Character of Wound/Ulcer Post Debridement is improved. Severity of Tissue Post Debridement is: Fat layer exposed. Post procedure Diagnosis Wound #9: Same as Pre-Procedure Pre-procedure diagnosis of Wound #9 is a Venous Leg Ulcer located on the Right, Distal, Anterior Lower Leg . There was a biopsy performed by Marolyn Nest, MD. There was a biopsy performed on Wound Bed, Wound Margin. The skin was cleansed and prepped with anti-septic followed by pain control using Lidocaine  Injectable: 1%. Utilizing a 5 mm tissue punch, tissue  was removed at its base with the following instrument(s): Forceps and Scissors and sent to pathology. A Moderate amount of bleeding was controlled with Electrocautery. A time out was conducted at 10:55, prior to the start of the procedure. The procedure was tolerated well with a pain level of 0  throughout and a pain level of 0 following the procedure. Post procedure Diagnosis Wound #9: Same as Pre-Procedure Plan MARCELLINA, [name] [id number] Page 9 of 10 Follow-up Appointments: Return Appointment in 1 week. - Dr. Marolyn Return Appointment in 2 weeks. - Dr. Marolyn Return appointment in 3 weeks. - Dr. Marolyn Other: - biopsy once it results will call you. Anesthetic: (In clinic) Topical Lidocaine  4% applied to wound bed Bathing/ Shower/ Hygiene: May shower with protection but do not get wound dressing(s) wet. Protect dressing(s) with water  repellant cover (for example, large plastic bag) or a cast cover and may then take shower. Edema Control - Orders / Instructions: Elevate legs to the level of the heart or above for 30 minutes daily and/or when sitting for 3-4 times a day throughout the day. Avoid standing for long periods of time. Exercise regularly Laboratory ordered were: Biopsy specimen culture - right anterior lower leg wound. WOUND #10: - Foot Wound Laterality: Right, Lateral Peri-Wound Care: Sween Lotion (Moisturizing lotion) 1 x Per Week/ Discharge Instructions: Apply moisturizing lotion as directed Prim Dressing: Promogran Prisma Matrix, 4.34 (sq in) (silver collagen) 1 x Per Week/ ary Discharge Instructions: Moisten collagen with saline or hydrogel Secondary Dressing: Optifoam Non-Adhesive Dressing, 4x4 in 1 x Per Week/ Discharge Instructions: Apply over primary dressing cut to make foam donut Secondary Dressing: Woven Gauze Sponges 2x2 in 1 x Per Week/ Discharge Instructions: Apply over primary dressing as directed. Secured With: 76M Medipore H Soft Cloth Surgical T ape, 4 x 10 (in/yd) 1 x Per Week/ Discharge Instructions: Secure with tape as directed. WOUND #9: - Lower Leg Wound Laterality: Right, Anterior, Distal Cleanser: Soap and Water  1 x Per Week/30 Days Discharge Instructions: May shower and wash wound with dial  antibacterial soap and water  prior to dressing change. Cleanser: Wound Cleanser 1 x Per Week/30 Days Discharge Instructions: Cleanse the wound with wound cleanser prior to applying a clean dressing using gauze sponges, not tissue or cotton balls. Peri-Wound Care: Sween Lotion (Moisturizing lotion) 1 x Per Week/30 Days Discharge Instructions: Apply moisturizing lotion as directed Prim Dressing: Promogran Prisma Matrix, 4.34 (sq in) (silver collagen) 1 x Per Week/30 Days ary Discharge Instructions: Moisten collagen with saline or hydrogel Secondary Dressing: ABD Pad, 8x10 1 x Per Week/30 Days Discharge Instructions: Apply over primary dressing as directed. Com pression Wrap: Kerlix Roll 4.5x3.1 (in/yd) 1 x Per Week/30 Days Discharge Instructions: Apply Kerlix and Coban compression as directed. Com pression Wrap: Coban Self-Adherent Wrap 4x5 (in/yd) 1 x Per Week/30 Days Discharge Instructions: Apply over Kerlix as directed. 07/04/2023: There is a fair amount of callus accumulation around the lateral foot ulcer. The surface of this wound is fairly pale. There is crusted dried blood around the anterior tibial ulcer. Tendon remains exposed at the base of the wound underneath a layer of slough. I used a curette to debride eschar, slough, and subcutaneous tissue from the anterior tibial wound. Based on its atypical appearance and behavior, I did elect to perform a biopsy today. After injecting the tissue in and around the wound with 1% lidocaine  with epinephrine , 2 punch biopsies were taken with a 5 mm punch. I sampled the center of the wound  as well as a margin of the wound. The tissue was placed in formalin and sent to pathology for further evaluation. I debrided callus, slough, and subcutaneous tissue off of the lateral foot. We will continue Prisma silver collagen to both sites with Kerlix and Coban compression. Follow-up in 1 week. We will contact her with biopsy results if those are available prior  to her visit. Electronic Signature(s) Signed: 07/04/2023 11:44:41 AM By: Marolyn Nest MD FACS Entered By: Marolyn Nest on 07/04/2023 11:44:41 -------------------------------------------------------------------------------- SuperBill Details Patient Name: Date of Service: LEAANN, NEVILS 07/04/2023 Medical Record Number: [id number] Patient Account Number: [id number] Date of Birth/Sex: Treating RN: 08-17-1955 (68 y.o. 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 Birdsall, [name] [id number] Page 10 of 10 L97.512 Non-pressure chronic ulcer of other part of right foot with fat layer exposed E11.622 Type 2 diabetes mellitus with other skin ulcer R60.0 Localized edema Facility Procedures : 3 CPT4 Code Description: [id number] 11042 - DEB SUBQ TISSUE 20 SQ CM/< ICD-10 Diagnosis Description L97.815 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 wi L97.512 Non-pressure chronic ulcer of other part of right foot  with fat layer exposed Modifier: thout evidence o Quantity: 1 f necrosis : 3 CPT4 Code Description: [id number] 11104-Punch biopsy of skin (including simple closure, when performed) single lesion ICD-10 Diagnosis Description L97.815 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 wi Modifier: thout evidence o Quantity: 1 f necrosis Physician Procedures : CPT4 Code Description Modifier [id number] - WC PHYS LEVEL 4 - EST PT 25 ICD-10 Diagnosis Description L97.815 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 without evidence o L97.512 Non-pressure chronic ulcer  of  other part of right foot with fat layer exposed E11.622 Type 2 diabetes mellitus with other skin ulcer R60.0 Localized edema Quantity: 1 f necrosis : [id number] 11042 - WC PHYS SUBQ TISS 20 SQ CM ICD-10 Diagnosis Description L97.815 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

## 2023-07-09 NOTE — Progress Notes (Signed)
 Rabago, Lark L ([id number]) [id number] Page 1 of 9 Visit Report for 06/27/2023 Arrival Information Details Patient Name: Date of Service: [name], [name] 06/27/2023 9:15 A M Medical Record Number: [id number] Patient Account Number: [id number] Date of Birth/Sex: Treating RN: 21-Jan-1956 (68 y.o. F) Primary Care Nerine Pulse: Bryan Bianchi Other Clinician: Referring Cleophus Mendonsa: Treating Olaoluwa Grieder/Extender: Marolyn Delon Bryan, [name] in Treatment: 8 Visit Information History Since Last Visit Added or deleted any medications: No Patient Arrived: Cane Any new allergies or adverse reactions: No Arrival Time: 09:27 Had a fall or experienced change in No Accompanied By: self activities of daily living that may affect Transfer Assistance: None risk of falls: Patient Identification Verified: Yes Signs or symptoms of abuse/neglect since last visito No Secondary Verification Process Completed: Yes Hospitalized since last visit: No Patient Requires Transmission-Based Precautions: No Implantable device outside of the clinic excluding No Patient Has Alerts: Yes cellular tissue based products placed in the center Patient Alerts: Patient on Blood Thinner since last visit: Has Dressing in Place as Prescribed: Yes Has Compression in Place as Prescribed: Yes Pain Present Now: No Electronic Signature(s) Signed: 07/07/2023 5:23:50 PM By: Wyn Iha Entered By: Wyn Iha on 06/27/2023 09:27:53 -------------------------------------------------------------------------------- Encounter Discharge Information Details Patient Name: Date of Service: [name] 06/27/2023 9:15 A M Medical Record Number: [id number] Patient Account Number: [id number] Date of Birth/Sex: Treating RN: 07/23/1955 (68 y.o. [name] Primary Care Arika Mainer: Bryan Bianchi Other Clinician: Referring Kayte Borchard: Treating Hollister Wessler/Extender: Marolyn Delon Bryan, [name] in  Treatment: 8 Encounter Discharge Information Items Post Procedure Vitals Discharge Condition: Stable Temperature (F): 97.9 Ambulatory Status: Cane Pulse (bpm): 78 Discharge Destination: Home Respiratory Rate (breaths/min): 18 Transportation: Private Auto Blood Pressure (mmHg): 148/64 Accompanied By: self Schedule Follow-up Appointment: Yes Clinical Summary of Care: Patient Declined Electronic Signature(s) Signed: 06/27/2023 10:12:31 AM By: Claven Pollen RN Entered By: Claven [name] on 06/27/2023 10:11:58 Membreno, Maylee L ([id number]) [id number] Page 2 of 9 -------------------------------------------------------------------------------- Lower Extremity Assessment Details Patient Name: Date of Service: [name], [name] L. 06/27/2023 9:15 A M Medical Record Number: [id number] Patient Account Number: [id number] Date of Birth/Sex: Treating RN: [birth year] (68 y.o. F) Primary Care Argentina Kosch: Bryan Bianchi Other Clinician: Referring Zainah Steven: Treating Bebe Moncure/Extender: Marolyn Delon Bryan, [name] in Treatment: 8 Edema Assessment Assessed: [Left: No] [Right: No] Edema: [Left: Ye] [Right: s] Calf Left: Right: Point of Measurement: 36 cm From Medial Instep 36 cm Ankle Left: Right: Point of Measurement: 9 cm From Medial Instep 24.4 cm Vascular Assessment Extremity colors, hair growth, and conditions: Extremity Color: [Right:Red] Hair Growth on Extremity: [Right:No] Temperature of Extremity: [Right:Warm] Capillary Refill: [Right:< 3 seconds] Dependent Rubor: [Right:No No] Toe Nail Assessment Left: Right: Thick: No Discolored: No Deformed: No Improper Length and Hygiene: No Electronic Signature(s) Signed: 07/07/2023 5:23:50 PM By: Wyn Iha Entered By: Wyn Iha on 06/27/2023 09:38:11 -------------------------------------------------------------------------------- Multi Wound Chart Details Patient Name: Date of Service: [name]  06/27/2023 9:15 A M Medical Record Number: [id number] Patient Account Number: [id number] Date of Birth/Sex: Treating RN: 05-12-1956 (68 y.o. F) Primary Care Derrious Bologna: Bryan Bianchi Other Clinician: Referring Chivon Lepage: Treating Milagro Belmares/Extender: Marolyn Delon Bryan, [name] in Treatment: 8 Vital Signs Height(in): 70 Pulse(bpm): 78 Weight(lbs): 166 Blood Pressure(mmHg): 148/64 Body Mass Index(BMI): 23.8 Temperature(F): 97.9 Respiratory Rate(breaths/min): 18 [name], [name] L ([id number]) [id number] Page 3 of 9 [10:Photos:] [N/A:N/A] Right, Lateral Foot Right, Distal, Anterior Lower Leg N/A Wound Location: Gradually Appeared Trauma N/A Wounding Event: Diabetic Wound/Ulcer of the Lower Venous Leg Ulcer N/A Primary Etiology: Extremity N/A Diabetic  Wound/Ulcer of the Lower N/A Secondary Etiology: Extremity Cataracts, Anemia, Sleep Apnea, Cataracts, Anemia, Sleep Apnea, N/A Comorbid History: Arrhythmia, Coronary Artery Disease, Arrhythmia, Coronary Artery Disease, Hypertension, Peripheral Venous Hypertension, Peripheral Venous Disease, Type II Diabetes, Disease, Type II Diabetes, Osteoarthritis, Neuropathy Osteoarthritis, Neuropathy 06/13/2023 04/10/2023 N/A Date Acquired: 2 8 N/A Weeks of Treatment: Open Open N/A Wound Status: No No N/A Wound Recurrence: 0.7x0.4x0.1 1.5x1x0.1 N/A Measurements L x W x D (cm) 0.22 1.178 N/A A (cm) : rea 0.022 0.118 N/A Volume (cm) : 50.00% 63.80% N/A % Reduction in A rea: 50.00% 63.70% N/A % Reduction in Volume: Grade 1 Full Thickness Without Exposed N/A Classification: Support Structures Medium Medium N/A Exudate A mount: Serosanguineous Serosanguineous N/A Exudate Type: red, brown red, brown N/A Exudate Color: Fibrotic scar, thickened scar Fibrotic scar, thickened scar N/A Wound Margin: Small (1-33%) Medium (34-66%) N/A Granulation A mount: N/A Red N/A Granulation Quality: Large (67-100%)  Medium (34-66%) N/A Necrotic A mount: Adherent Slough Eschar, Adherent Slough N/A Necrotic Tissue: Fat Layer (Subcutaneous Tissue): Yes Fat Layer (Subcutaneous Tissue): Yes N/A Exposed Structures: Fascia: No Fascia: No Tendon: No Tendon: No Muscle: No Muscle: No Joint: No Joint: No Bone: No Bone: No Small (1-33%) Large (67-100%) N/A Epithelialization: Debridement - Excisional Debridement - Excisional N/A Debridement: Pre-procedure Verification/Time Out 10:00 10:00 N/A Taken: Lidocaine  4% Topical Solution Lidocaine  4% Topical Solution N/A Pain Control: Subcutaneous, Slough Necrotic/Eschar, Subcutaneous, N/A Tissue Debrided: Slough Skin/Subcutaneous Tissue Skin/Subcutaneous Tissue N/A Level: 0.22 1.18 N/A Debridement A (sq cm): rea Curette Curette N/A Instrument: Minimum Minimum N/A Bleeding: Pressure Pressure N/A Hemostasis Achieved: 2 2 N/A Procedural Pain: 1 1 N/A Post Procedural Pain: Procedure was tolerated well Procedure was tolerated well N/A Debridement Treatment Response: 0.7x0.4x0.1 1.5x1x0.1 N/A Post Debridement Measurements L x W x D (cm) 0.022 0.118 N/A Post Debridement Volume: (cm) Callus: Yes No Abnormalities Noted N/A Periwound Skin Texture: Excoriation: No Induration: No Crepitus: No Rash: No Scarring: No No Abnormalities Noted No Abnormalities Noted N/A Periwound Skin Moisture: No Abnormalities Noted No Abnormalities Noted N/A Periwound Skin Color: No Abnormality No Abnormality N/A Temperature: Debridement Debridement N/A Procedures Performed: Treatment Notes Wound #10 (Foot) Wound Laterality: Right, Lateral Cleanser Peri-Wound Care AFREEN, SIEBELS ([id number]) [id number] Page 4 of 9 Sween Lotion (Moisturizing lotion) Discharge Instruction: Apply moisturizing lotion as directed Topical Primary Dressing Promogran Prisma Matrix, 4.34 (sq in) (silver collagen) Discharge Instruction: Moisten collagen with  saline or hydrogel Secondary Dressing Optifoam Non-Adhesive Dressing, 4x4 in Discharge Instruction: Apply over primary dressing cut to make foam donut Woven Gauze Sponges 2x2 in Discharge Instruction: Apply over primary dressing as directed. Secured With 85M Medipore H Soft Cloth Surgical T ape, 4 x 10 (in/yd) Discharge Instruction: Secure with tape as directed. Compression Wrap Compression Stockings Add-Ons Wound #9 (Lower Leg) Wound Laterality: Right, Anterior, Distal Cleanser Soap and Water  Discharge Instruction: May shower and wash wound with dial antibacterial soap and water  prior to dressing change. Wound Cleanser Discharge Instruction: Cleanse the wound with wound cleanser prior to applying a clean dressing using gauze sponges, not tissue or cotton balls. Peri-Wound Care Sween Lotion (Moisturizing lotion) Discharge Instruction: Apply moisturizing lotion as directed Topical Primary Dressing Promogran Prisma Matrix, 4.34 (sq in) (silver collagen) Discharge Instruction: Moisten collagen with saline or hydrogel Secondary Dressing ABD Pad, 8x10 Discharge Instruction: Apply over primary dressing as directed. Secured With Compression Wrap Kerlix Roll 4.5x3.1 (in/yd) Discharge Instruction: Apply Kerlix and Coban compression as directed. Coban Self-Adherent Wrap 4x5 (in/yd) Discharge Instruction: Apply over Kerlix  as directed. Compression Stockings Add-Ons Electronic Signature(s) Signed: 06/27/2023 10:21:58 AM By: Marolyn Nest MD FACS Entered By: Marolyn Nest on 06/27/2023 10:21:58 Multi-Disciplinary Care Plan Details -------------------------------------------------------------------------------- [name] ADRIEN CROME ([id number]) [id number] Page 5 of 9 Patient Name: Date of Service: ALICESON, DOLBOW 06/27/2023 9:15 A M Medical Record Number: [id number] Patient Account Number: [id number] Date of Birth/Sex: Treating RN: [birth year] (68 y.o. [name] [name] Primary Care Pattricia Weiher: Bryan Bianchi Other Clinician: Referring Briel Gallicchio: Treating Chrisangel Eskenazi/Extender: Marolyn Nest Bryan Bianchi Devra in Treatment: 8 Multidisciplinary Care Plan reviewed with physician Active Inactive Venous Leg Ulcer Nursing Diagnoses: Knowledge deficit related to disease process and management Potential for venous Insuffiency (use before diagnosis confirmed) Goals: Patient will maintain optimal edema control Date Initiated: 06/27/2023 Target Resolution Date: 07/25/2023 Goal Status: Active Interventions: Assess peripheral edema status every visit. Compression as ordered Treatment Activities: Therapeutic compression applied : 06/27/2023 Notes: Wound/Skin Impairment Nursing Diagnoses: Impaired tissue integrity Knowledge deficit related to ulceration/compromised skin integrity Goals: Patient/caregiver will verbalize understanding of skin care regimen Date Initiated: 05/12/2023 Target Resolution Date: 08/22/2023 Goal Status: Active Ulcer/skin breakdown will heal within 14 weeks Date Initiated: 05/12/2023 Date Inactivated: 06/13/2023 Target Resolution Date: 06/06/2023 Goal Status: Unmet Unmet Reason: continue wound care Interventions: Assess ulceration(s) every visit Treatment Activities: Skin care regimen initiated : 05/12/2023 Topical wound management initiated : 05/12/2023 Notes: Electronic Signature(s) Signed: 06/27/2023 12:47:24 PM By: [name] RN, BSN Entered By: [name] on 06/27/2023 10:08:18 -------------------------------------------------------------------------------- Pain Assessment Details Patient Name: Date of Service: [name] 06/27/2023 9:15 A M Medical Record Number: [id number] Patient Account Number: [id number] Date of Birth/Sex: Treating RN: April 07, 1956 (68 y.o. F) Primary Care Delmi Fulfer: Bryan Bianchi Other Clinician: Referring Paulita Licklider: Treating Annemarie Sebree/Extender: Marolyn Nest Bryan, [name] in  Treatment: [address] Marlow L ([id number]) [id number] Page 6 of 9 Active Problems Location of Pain Severity and Description of Pain Patient Has Paino No Site Locations Pain Management and Medication Current Pain Management: Electronic Signature(s) Signed: 07/07/2023 5:23:50 PM By: Wyn Iha Entered By: Wyn Iha on 06/27/2023 09:28:18 -------------------------------------------------------------------------------- Patient/Caregiver Education Details Patient Name: Date of Service: [name] 1/3/2025andnbsp9:15 A M Medical Record Number: [id number] Patient Account Number: [id number] Date of Birth/Gender: Treating RN: 10-08-1955 (68 y.o. [name] [name] Primary Care Physician: Bryan Bianchi Other Clinician: Referring Physician: Treating Physician/Extender: Marolyn Nest Bryan Bianchi Devra in Treatment: 8 Education Assessment Education Provided To: Patient Education Topics Provided Venous: Methods: Explain/Verbal Responses: Reinforcements needed, State content correctly Wound/Skin Impairment: Methods: Explain/Verbal Responses: Reinforcements needed, State content correctly Electronic Signature(s) Signed: 06/27/2023 12:47:24 PM By: [name] RN, BSN Entered By: [name], [name] on 06/27/2023 10:08:40 [name] ADRIEN CROME ([id number]) [id number] Page 7 of 9 -------------------------------------------------------------------------------- Wound Assessment Details Patient Name: Date of Service: SELESTE, TALLMAN 06/27/2023 9:15 A M Medical Record Number: [id number] Patient Account Number: [id number] Date of Birth/Sex: Treating RN: 06-Sep-1955 (68 y.o. F) Primary Care Siddhartha Hoback: Bryan Bianchi Other Clinician: Referring Kjersti Dittmer: Treating Colsen Modi/Extender: Marolyn Nest Bryan, [name] in Treatment: 8 Wound Status Wound Number: 10 Primary Diabetic Wound/Ulcer of the Lower Extremity Etiology: Wound  Location: Right, Lateral Foot Wound Open Wounding Event: Gradually Appeared Status: Date Acquired: 06/13/2023 Comorbid Cataracts, Anemia, Sleep Apnea, Arrhythmia, Coronary Artery Weeks Of Treatment: 2 History: Disease, Hypertension, Peripheral Venous Disease, Type II Clustered Wound: No Diabetes, Osteoarthritis, Neuropathy Photos Wound Measurements Length: (cm) 0 Width: (cm) 0 Depth: (cm) 0 Area: (cm) Volume: (cm) .7 % Reduction in Area: 50% .4 % Reduction in Volume: 50% .1 Epithelialization: Small (1-33%) 0.22 Tunneling: No 0.022  Undermining: No Wound Description Classification: Grade 1 Wound Margin: Fibrotic scar, thickened scar Exudate Amount: Medium Exudate Type: Serosanguineous Exudate Color: red, brown Foul Odor After Cleansing: No Slough/Fibrino Yes Wound Bed Granulation Amount: Small (1-33%) Exposed Structure Necrotic Amount: Large (67-100%) Fascia Exposed: No Necrotic Quality: Adherent Slough Fat Layer (Subcutaneous Tissue) Exposed: Yes Tendon Exposed: No Muscle Exposed: No Joint Exposed: No Bone Exposed: No Periwound Skin Texture Texture Color No Abnormalities Noted: No No Abnormalities Noted: Yes Callus: Yes Temperature / Pain Crepitus: No Temperature: No Abnormality Excoriation: No Induration: No Rash: No Scarring: No Moisture No Abnormalities Noted: Yes Krakow, Drue L ([id number]) [id number] Page 8 of 9 Electronic Signature(s) Signed: 07/07/2023 5:23:50 PM By: Wyn Iha Entered By: Wyn Iha on 06/27/2023 09:46:55 -------------------------------------------------------------------------------- Wound Assessment Details Patient Name: Date of Service: ANAELLE, DUNTON 06/27/2023 9:15 A M Medical Record Number: [id number] Patient Account Number: [id number] Date of Birth/Sex: Treating RN: 1956-04-13 (68 y.o. F) Primary Care Journey Castonguay: Bryan Bianchi Other Clinician: Referring Kenrick Pore: Treating Alorah Mcree/Extender:  Marolyn Delon Bryan, [name] in Treatment: 8 Wound Status Wound Number: 9 Primary Venous Leg Ulcer Etiology: Wound Location: Right, Distal, Anterior Lower Leg Secondary Diabetic Wound/Ulcer of the Lower Extremity Wounding Event: Trauma Etiology: Date Acquired: 04/10/2023 Wound Open Weeks Of Treatment: 8 Status: Clustered Wound: No Comorbid Cataracts, Anemia, Sleep Apnea, Arrhythmia, Coronary Artery History: Disease, Hypertension, Peripheral Venous Disease, Type II Diabetes, Osteoarthritis, Neuropathy Photos Wound Measurements Length: (cm) 1.5 Width: (cm) 1 Depth: (cm) 0.1 Area: (cm) 1.178 Volume: (cm) 0.118 % Reduction in Area: 63.8% % Reduction in Volume: 63.7% Epithelialization: Large (67-100%) Tunneling: No Undermining: No Wound Description Classification: Full Thickness Without Exposed Suppor Wound Margin: Fibrotic scar, thickened scar Exudate Amount: Medium Exudate Type: Serosanguineous Exudate Color: red, brown t Structures Foul Odor After Cleansing: No Slough/Fibrino Yes Wound Bed Granulation Amount: Medium (34-66%) Exposed Structure Granulation Quality: Red Fascia Exposed: No Necrotic Amount: Medium (34-66%) Fat Layer (Subcutaneous Tissue) Exposed: Yes Necrotic Quality: Eschar, Adherent Slough Tendon Exposed: No Muscle Exposed: No Joint Exposed: No Bone Exposed: No Periwound Skin Texture Texture Color No Abnormalities Noted: Yes No Abnormalities Noted: Yes Moisture Temperature / Pain No Abnormalities Noted: Yes Temperature: No Abnormality Aldape, Drake L ([id number]) [id number] Page 9 of 9 Electronic Signature(s) Signed: 07/07/2023 5:23:50 PM By: Wyn Iha Entered By: Wyn Iha on 06/27/2023 09:46:26 -------------------------------------------------------------------------------- Vitals Details Patient Name: Date of Service: [name] 06/27/2023 9:15 A M Medical Record Number: [id number] Patient  Account Number: [id number] Date of Birth/Sex: Treating RN: 12-13-1955 (68 y.o. F) Primary Care Shalayna Ornstein: Bryan Bianchi Other Clinician: Referring Hiroko Tregre: Treating Giulia Hickey/Extender: Marolyn Delon Bryan, [name] in Treatment: 8 Vital Signs Time Taken: 09:27 Temperature (F): 97.9 Height (in): 70 Pulse (bpm): 78 Weight (lbs): 166 Respiratory Rate (breaths/min): 18 Body Mass Index (BMI): 23.8 Blood Pressure (mmHg): 148/64 Reference Range: 80 - 120 mg / dl Electronic Signature(s) Signed: 07/07/2023 5:23:50 PM By: Wyn Iha Entered By: Wyn Iha on 06/27/2023 90:71:87

## 2023-07-09 NOTE — Progress Notes (Signed)
 [name], [name] [id number] Page 1 of 10 Visit Report for 07/04/2023 Arrival Information Details Patient Name: Date of Service: [name] 07/04/2023 10:30 A M Medical Record Number: [id number] Patient Account Number: [id number] Date of Birth/Sex: Treating RN: 01/29/1956 (68 y.o. F) Primary Care Froilan Mclean: Bryan Bianchi Other Clinician: Referring Oasis Goehring: Treating Aleksander Edmiston/Extender: Marolyn Delon Bryan, [name] in Treatment: 9 Visit Information History Since Last Visit Added or deleted any medications: No Patient Arrived: Cane Any new allergies or adverse reactions: No Arrival Time: 10:34 Had a fall or experienced change in No Accompanied By: self activities of daily living that may affect Transfer Assistance: None risk of falls: Patient Identification Verified: Yes Signs or symptoms of abuse/neglect since last visito No Secondary Verification Process Completed: Yes Hospitalized since last visit: No Patient Requires Transmission-Based Precautions: No Implantable device outside of the clinic excluding No Patient Has Alerts: Yes cellular tissue based products placed in the center Patient Alerts: Patient on Blood Thinner since last visit: Pain Present Now: Yes Electronic Signature(s) Signed: 07/04/2023 10:58:35 AM By: Okey Bonier Entered By: Okey Bonier on 07/04/2023 10:34:44 -------------------------------------------------------------------------------- Encounter Discharge Information Details Patient Name: Date of Service: [name]. 07/04/2023 10:30 A M Medical Record Number: [id number] Patient Account Number: [id number] Date of Birth/Sex: Treating RN: [birth year] (68 y.o. [name] Primary Care Shwanda Soltis: Bryan Bianchi Other Clinician: Referring Alorah Mcree: Treating Braxon Suder/Extender: Marolyn Delon Bryan, [name] in Treatment: 9 Encounter Discharge Information Items Post Procedure Vitals Discharge  Condition: Stable Temperature (F): 97.8 Ambulatory Status: Cane Pulse (bpm): 73 Discharge Destination: Home Respiratory Rate (breaths/min): 18 Transportation: Private Auto Blood Pressure (mmHg): 156/62 Accompanied By: self Schedule Follow-up Appointment: Yes Clinical Summary of Care: Electronic Signature(s) Signed: 07/07/2023 4:48:15 PM By: Drury Nestle RN, BSN Entered By: Drury [name] on 07/04/2023 11:09:45 [name], [name] [id number] Page 2 of 10 -------------------------------------------------------------------------------- Lower Extremity Assessment Details Patient Name: Date of Service: [name], [name] 07/04/2023 10:30 A M Medical Record Number: [id number] Patient Account Number: [id number] Date of Birth/Sex: Treating RN: [birth year] (69 y.o. F) Primary Care Mikita Lesmeister: Bryan Bianchi Other Clinician: Referring Eshal Propps: Treating Dwaine Pringle/Extender: Marolyn Delon Bryan, [name] in Treatment: 9 Edema Assessment Assessed: [Left: No] [Right: No] Edema: [Left: Ye] [Right: s] Calf Left: Right: Point of Measurement: 36 cm From Medial Instep 34.5 cm Ankle Left: Right: Point of Measurement: 9 cm From Medial Instep 24 cm Vascular Assessment Extremity colors, hair growth, and conditions: Extremity Color: [Right:Red] Hair Growth on Extremity: [Right:No] Temperature of Extremity: [Right:Warm] Capillary Refill: [Right:< 3 seconds] Dependent Rubor: [Right:No No] Toe Nail Assessment Left: Right: Thick: No Discolored: No Deformed: No Improper Length and Hygiene: No Electronic Signature(s) Signed: 07/07/2023 5:23:50 PM By: Wyn Iha Entered By: Wyn Iha on 07/04/2023 10:43:32 -------------------------------------------------------------------------------- Multi Wound Chart Details Patient Name: Date of Service: [name]. 07/04/2023 10:30 A M Medical Record Number: [id number] Patient Account Number: [id number] Date of  Birth/Sex: Treating RN: 01-Mar-1956 (68 y.o. F) Primary Care Ladarion Munyon: Bryan Bianchi Other Clinician: Referring Coretta Leisey: Treating Catarina Huntley/Extender: Marolyn Delon Bryan, [name] in Treatment: 9 Vital Signs Height(in): 70 Pulse(bpm): 73 Weight(lbs): 166 Blood Pressure(mmHg): 156/62 Body Mass Index(BMI): 23.8 Temperature(F): 97.8 Respiratory Rate(breaths/min): 18 [name], [name] [id number] Page 3 of 10 [10:Photos:] [N/A:N/A] Right, Lateral Foot Right, Distal, Anterior Lower Leg N/A Wound Location: Gradually Appeared Trauma N/A Wounding Event: Diabetic Wound/Ulcer of the Lower Venous Leg Ulcer N/A Primary Etiology: Extremity N/A Diabetic Wound/Ulcer of the Lower N/A Secondary Etiology: Extremity Cataracts, Anemia, Sleep Apnea, Cataracts, Anemia, Sleep  Apnea, N/A Comorbid History: Arrhythmia, Coronary Artery Disease, Arrhythmia, Coronary Artery Disease, Hypertension, Peripheral Venous Hypertension, Peripheral Venous Disease, Type II Diabetes, Disease, Type II Diabetes, Osteoarthritis, Neuropathy Osteoarthritis, Neuropathy 06/13/2023 04/10/2023 N/A Date Acquired: 3 9 N/A Weeks of Treatment: Open Open N/A Wound Status: No No N/A Wound Recurrence: 1x0.7x0.1 1.5x0.9x0.1 N/A Measurements [name] x W x D (cm) 0.55 1.06 N/A A (cm) : rea 0.055 0.106 N/A Volume (cm) : -25.00% 67.40% N/A % Reduction in A rea: -25.00% 67.40% N/A % Reduction in Volume: Grade 1 Full Thickness Without Exposed N/A Classification: Support Structures Medium Medium N/A Exudate A mount: Serosanguineous Serosanguineous N/A Exudate Type: red, brown red, brown N/A Exudate Color: Fibrotic scar, thickened scar Fibrotic scar, thickened scar N/A Wound Margin: None Present (0%) Large (67-100%) N/A Granulation A mount: N/A Red N/A Granulation Quality: Large (67-100%) Small (1-33%) N/A Necrotic A mount: Adherent Slough Eschar, Adherent Slough N/A Necrotic  Tissue: Fat Layer (Subcutaneous Tissue): Yes Fat Layer (Subcutaneous Tissue): Yes N/A Exposed Structures: Fascia: No Fascia: No Tendon: No Tendon: No Muscle: No Muscle: No Joint: No Joint: No Bone: No Bone: No Small (1-33%) Small (1-33%) N/A Epithelialization: Debridement - Excisional Debridement - Excisional N/A Debridement: Pre-procedure Verification/Time Out 10:45 10:45 N/A Taken: Lidocaine  4% Topical Solution Lidocaine  4% Topical Solution N/A Pain Control: Callus, Subcutaneous, Slough Necrotic/Eschar, Callus, N/A Tissue Debrided: Subcutaneous, Slough Skin/Subcutaneous Tissue Skin/Subcutaneous Tissue N/A Level: 0.55 1.06 N/A Debridement A (sq cm): rea Curette Curette N/A Instrument: Moderate Moderate N/A Bleeding: Pressure Pressure N/A Hemostasis Achieved: 0 0 N/A Procedural Pain: 0 0 N/A Post Procedural Pain: Procedure was tolerated well Procedure was tolerated well N/A Debridement Treatment Response: 1x0.7x0.1 1.5x0.9x0.1 N/A Post Debridement Measurements [name] x W x D (cm) 0.055 0.106 N/A Post Debridement Volume: (cm) Callus: Yes No Abnormalities Noted N/A Periwound Skin Texture: Excoriation: No Induration: No Crepitus: No Rash: No Scarring: No No Abnormalities Noted No Abnormalities Noted N/A Periwound Skin Moisture: No Abnormalities Noted No Abnormalities Noted N/A Periwound Skin Color: No Abnormality No Abnormality N/A Temperature: Debridement Biopsy N/A Procedures Performed: Debridement Treatment Notes Wound #10 (Foot) Wound Laterality: Right, Lateral Cleanser Wuest, [name] [id number] Page 4 of 10 Peri-Wound Care Sween Lotion (Moisturizing lotion) Discharge Instruction: Apply moisturizing lotion as directed Topical Primary Dressing Promogran Prisma Matrix, 4.34 (sq in) (silver collagen) Discharge Instruction: Moisten collagen with saline or hydrogel Secondary Dressing Optifoam Non-Adhesive  Dressing, 4x4 in Discharge Instruction: Apply over primary dressing cut to make foam donut Woven Gauze Sponges 2x2 in Discharge Instruction: Apply over primary dressing as directed. Secured With 14M Medipore H Soft Cloth Surgical T ape, 4 x 10 (in/yd) Discharge Instruction: Secure with tape as directed. Compression Wrap Compression Stockings Add-Ons Wound #9 (Lower Leg) Wound Laterality: Right, Anterior, Distal Cleanser Soap and Water  Discharge Instruction: May shower and wash wound with dial antibacterial soap and water  prior to dressing change. Wound Cleanser Discharge Instruction: Cleanse the wound with wound cleanser prior to applying a clean dressing using gauze sponges, not tissue or cotton balls. Peri-Wound Care Sween Lotion (Moisturizing lotion) Discharge Instruction: Apply moisturizing lotion as directed Topical Primary Dressing Promogran Prisma Matrix, 4.34 (sq in) (silver collagen) Discharge Instruction: Moisten collagen with saline or hydrogel Secondary Dressing ABD Pad, 8x10 Discharge Instruction: Apply over primary dressing as directed. Secured With Compression Wrap Kerlix Roll 4.5x3.1 (in/yd) Discharge Instruction: Apply Kerlix and Coban compression as directed. Coban Self-Adherent Wrap 4x5 (in/yd) Discharge Instruction: Apply over Kerlix as directed. Compression Stockings Add-Ons Electronic Signature(s) Signed: 07/04/2023 11:36:58 AM  By: Marolyn Nest MD FACS Entered By: Marolyn Nest on 07/04/2023 11:36:58 [name], [name] ([id number]) [id number] Page 5 of 10 -------------------------------------------------------------------------------- Multi-Disciplinary Care Plan Details Patient Name: Date of Service: NAIYAH, KLOSTERMANN 07/04/2023 10:30 A M Medical Record Number: [id number] Patient Account Number: [id number] Date of Birth/Sex: Treating RN: [birth year] (68 y.o. [name] Primary Care Muskaan Smet: Bryan Bianchi Other  Clinician: Referring Harlee Eckroth: Treating Eutha Cude/Extender: Marolyn Nest Bryan Bianchi Devra in Treatment: 9 Multidisciplinary Care Plan reviewed with physician Active Inactive Venous Leg Ulcer Nursing Diagnoses: Knowledge deficit related to disease process and management Potential for venous Insuffiency (use before diagnosis confirmed) Goals: Patient will maintain optimal edema control Date Initiated: 06/27/2023 Target Resolution Date: 07/25/2023 Goal Status: Active Interventions: Assess peripheral edema status every visit. Compression as ordered Treatment Activities: Therapeutic compression applied : 06/27/2023 Notes: Wound/Skin Impairment Nursing Diagnoses: Impaired tissue integrity Knowledge deficit related to ulceration/compromised skin integrity Goals: Patient/caregiver will verbalize understanding of skin care regimen Date Initiated: 05/12/2023 Target Resolution Date: 08/22/2023 Goal Status: Active Ulcer/skin breakdown will heal within 14 weeks Date Initiated: 05/12/2023 Date Inactivated: 06/13/2023 Target Resolution Date: 06/06/2023 Goal Status: Unmet Unmet Reason: continue wound care Interventions: Assess ulceration(s) every visit Treatment Activities: Skin care regimen initiated : 05/12/2023 Topical wound management initiated : 05/12/2023 Notes: Electronic Signature(s) Signed: 07/07/2023 4:48:15 PM By: Drury Nestle RN, BSN Entered By: Drury [name] on 07/04/2023 11:08:43 -------------------------------------------------------------------------------- Pain Assessment Details Patient Name: Date of Service: [name] [name] 07/04/2023 10:30 A M Medical Record Number: [id number] Patient Account Number: [id number] Date of Birth/Sex: Treating RN: 07/03/1955 (68 y.o. F) Primary Care Kaisyn Millea: Bryan Bianchi Other Clinician: Referring Jolyn Deshmukh: Treating Garon Melander/Extender: Marolyn Nest Bryan Bianchi Eldora, [name]  [id number] Page 6 of 10 Weeks in Treatment: 9 Active Problems Location of Pain Severity and Description of Pain Patient Has Paino Yes Site Locations Rate the pain. Current Pain Level: 4 Worst Pain Level: 10 Least Pain Level: 0 Tolerable Pain Level: 2 Character of Pain Describe the Pain: Stabbing, Throbbing Pain Management and Medication Current Pain Management: Electronic Signature(s) Signed: 07/04/2023 10:58:35 AM By: Okey Bonier Entered By: Okey Bonier on 07/04/2023 10:35:33 -------------------------------------------------------------------------------- Patient/Caregiver Education Details Patient Name: Date of Service: [name] [name] 1/10/2025andnbsp10:30 A M Medical Record Number: [id number] Patient Account Number: [id number] Date of Birth/Gender: Treating RN: Jan 03, 1956 (68 y.o. [name] Primary Care Physician: Bryan Bianchi Other Clinician: Referring Physician: Treating Physician/Extender: Marolyn Nest Bryan Bianchi Devra in Treatment: 9 Education Assessment Education Provided To: Patient Education Topics Provided Pain: Handouts: A Guide to Pain Control Methods: Explain/Verbal Responses: Reinforcements needed Electronic Signature(s) Signed: 07/07/2023 4:48:15 PM By: Drury Nestle RN, BSN Entered By: Drury [name] on 07/04/2023 11:08:56 [name], [name] [id number] Page 7 of 10 -------------------------------------------------------------------------------- Wound Assessment Details Patient Name: Date of Service: [name], [name] 07/04/2023 10:30 A M Medical Record Number: [id number] Patient Account Number: [id number] Date of Birth/Sex: Treating RN: [birth year] (68 y.o. F) Primary Care Taniah Reinecke: Bryan Bianchi Other Clinician: Referring Kaidyn Hernandes: Treating Alphonsus Doyel/Extender: Marolyn Nest Bryan, [name] in Treatment: 9 Wound Status Wound Number: 10 Primary Diabetic Wound/Ulcer of  the Lower Extremity Etiology: Wound Location: Right, Lateral Foot Wound Open Wounding Event: Gradually Appeared Status: Date Acquired: 06/13/2023 Comorbid Cataracts, Anemia, Sleep Apnea, Arrhythmia, Coronary Artery Weeks Of Treatment: 3 History: Disease, Hypertension, Peripheral Venous Disease, Type II Clustered Wound: No Diabetes, Osteoarthritis, Neuropathy Photos Wound Measurements Length: (cm) 1 Width: (cm) 0 Depth: (cm) 0 Area: (cm) Volume: (cm) % Reduction in Area: -25% .7 % Reduction in Volume: -25% .  1 Epithelialization: Small (1-33%) 0.55 Tunneling: No 0.055 Undermining: No Wound Description Classification: Grade 1 Wound Margin: Fibrotic scar, thickened scar Exudate Amount: Medium Exudate Type: Serosanguineous Exudate Color: red, brown Foul Odor After Cleansing: No Slough/Fibrino Yes Wound Bed Granulation Amount: None Present (0%) Exposed Structure Necrotic Amount: Large (67-100%) Fascia Exposed: No Necrotic Quality: Adherent Slough Fat Layer (Subcutaneous Tissue) Exposed: Yes Tendon Exposed: No Muscle Exposed: No Joint Exposed: No Bone Exposed: No Periwound Skin Texture Texture Color No Abnormalities Noted: No No Abnormalities Noted: Yes Callus: Yes Temperature / Pain Crepitus: No Temperature: No Abnormality Excoriation: No Induration: No Rash: No Scarring: No Moisture No Abnormalities Noted: Yes [name], [name] [id number] Page 8 of 10 Treatment Notes Wound #10 (Foot) Wound Laterality: Right, Lateral Cleanser Peri-Wound Care Sween Lotion (Moisturizing lotion) Discharge Instruction: Apply moisturizing lotion as directed Topical Primary Dressing Promogran Prisma Matrix, 4.34 (sq in) (silver collagen) Discharge Instruction: Moisten collagen with saline or hydrogel Secondary Dressing Optifoam Non-Adhesive Dressing, 4x4 in Discharge Instruction: Apply over primary dressing cut to make foam donut Woven Gauze  Sponges 2x2 in Discharge Instruction: Apply over primary dressing as directed. Secured With 62M Medipore H Soft Cloth Surgical T ape, 4 x 10 (in/yd) Discharge Instruction: Secure with tape as directed. Compression Wrap Compression Stockings Add-Ons Electronic Signature(s) Signed: 07/07/2023 5:23:50 PM By: Wyn Iha Entered By: Wyn Iha on 07/04/2023 10:44:12 -------------------------------------------------------------------------------- Wound Assessment Details Patient Name: Date of Service: [name], [name] 07/04/2023 10:30 A M Medical Record Number: [id number] Patient Account Number: [id number] Date of Birth/Sex: Treating RN: Dec 06, 1955 (68 y.o. F) Primary Care Kari Kerth: Bryan Bianchi Other Clinician: Referring Karstyn Birkey: Treating Lazarus Sudbury/Extender: Marolyn Delon Bryan, [name] in Treatment: 9 Wound Status Wound Number: 9 Primary Venous Leg Ulcer Etiology: Wound Location: Right, Distal, Anterior Lower Leg Secondary Diabetic Wound/Ulcer of the Lower Extremity Wounding Event: Trauma Etiology: Date Acquired: 04/10/2023 Wound Open Weeks Of Treatment: 9 Status: Clustered Wound: No Comorbid Cataracts, Anemia, Sleep Apnea, Arrhythmia, Coronary Artery History: Disease, Hypertension, Peripheral Venous Disease, Type II Diabetes, Osteoarthritis, Neuropathy Photos [name], BOUSKA [name] ([id number]) [id number] Page 9 of 10 Wound Measurements Length: (cm) 1.5 Width: (cm) 0.9 Depth: (cm) 0.1 Area: (cm) 1.06 Volume: (cm) 0.106 % Reduction in Area: 67.4% % Reduction in Volume: 67.4% Epithelialization: Small (1-33%) Tunneling: No Undermining: No Wound Description Classification: Full Thickness Without Exposed Support Structures Wound Margin: Fibrotic scar, thickened scar Exudate Amount: Medium Exudate Type: Serosanguineous Exudate Color: red, brown Foul Odor After Cleansing: No Slough/Fibrino Yes Wound Bed Granulation Amount: Large  (67-100%) Exposed Structure Granulation Quality: Red Fascia Exposed: No Necrotic Amount: Small (1-33%) Fat Layer (Subcutaneous Tissue) Exposed: Yes Necrotic Quality: Eschar, Adherent Slough Tendon Exposed: No Muscle Exposed: No Joint Exposed: No Bone Exposed: No Periwound Skin Texture Texture Color No Abnormalities Noted: Yes No Abnormalities Noted: Yes Moisture Temperature / Pain No Abnormalities Noted: Yes Temperature: No Abnormality Treatment Notes Wound #9 (Lower Leg) Wound Laterality: Right, Anterior, Distal Cleanser Soap and Water  Discharge Instruction: May shower and wash wound with dial antibacterial soap and water  prior to dressing change. Wound Cleanser Discharge Instruction: Cleanse the wound with wound cleanser prior to applying a clean dressing using gauze sponges, not tissue or cotton balls. Peri-Wound Care Sween Lotion (Moisturizing lotion) Discharge Instruction: Apply moisturizing lotion as directed Topical Primary Dressing Promogran Prisma Matrix, 4.34 (sq in) (silver collagen) Discharge Instruction: Moisten collagen with saline or hydrogel Secondary Dressing ABD Pad, 8x10 Discharge Instruction: Apply over primary dressing as directed. Secured With Compression Wrap Kerlix Roll 4.5x3.1 (in/yd)  Discharge Instruction: Apply Kerlix and Coban compression as directed. Coban Self-Adherent Wrap 4x5 (in/yd) Discharge Instruction: Apply over Kerlix as directed. Compression Stockings Add-Ons Electronic Signature(s) Signed: 07/07/2023 5:23:50 PM By: Wyn Iha Entered By: Wyn Iha on 07/04/2023 10:44:42 Vanorder, [name] [id number] Page 10 of 10 -------------------------------------------------------------------------------- Vitals Details Patient Name: Date of Service: [name], [name] 07/04/2023 10:30 A M Medical Record Number: [id number] Patient Account Number: [id number] Date of Birth/Sex: Treating RN: July 28, 1955  (68 y.o. F) Primary Care Blaize Epple: Bryan Bianchi Other Clinician: Referring Rosalene Wardrop: Treating Calyse Murcia/Extender: Marolyn Delon Bryan, [name] in Treatment: 9 Vital Signs Time Taken: 10:34 Temperature (F): 97.8 Height (in): 70 Pulse (bpm): 73 Weight (lbs): 166 Respiratory Rate (breaths/min): 18 Body Mass Index (BMI): 23.8 Blood Pressure (mmHg): 156/62 Reference Range: 80 - 120 mg / dl Electronic Signature(s) Signed: 07/04/2023 10:58:35 AM By: Okey Bonier Entered By: Okey Bonier on 07/04/2023 10:35:05

## 2023-07-11 ENCOUNTER — Encounter (HOSPITAL_BASED_OUTPATIENT_CLINIC_OR_DEPARTMENT_OTHER): Payer: Medicare Other | Admitting: General Surgery

## 2023-07-11 DIAGNOSIS — C44722 Squamous cell carcinoma of skin of right lower limb, including hip: Secondary | ICD-10-CM | POA: Diagnosis not present

## 2023-07-11 DIAGNOSIS — E11621 Type 2 diabetes mellitus with foot ulcer: Secondary | ICD-10-CM | POA: Diagnosis not present

## 2023-07-11 DIAGNOSIS — E11622 Type 2 diabetes mellitus with other skin ulcer: Secondary | ICD-10-CM | POA: Diagnosis not present

## 2023-07-11 DIAGNOSIS — I872 Venous insufficiency (chronic) (peripheral): Secondary | ICD-10-CM | POA: Diagnosis not present

## 2023-07-11 DIAGNOSIS — R600 Localized edema: Secondary | ICD-10-CM | POA: Diagnosis not present

## 2023-07-11 DIAGNOSIS — L97512 Non-pressure chronic ulcer of other part of right foot with fat layer exposed: Secondary | ICD-10-CM | POA: Diagnosis not present

## 2023-07-11 DIAGNOSIS — L97815 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 without evidence of necrosis: Secondary | ICD-10-CM | POA: Diagnosis not present

## 2023-07-11 NOTE — Progress Notes (Addendum)
[name], [name] ([id number]) [phone].pdf Page 1 of 9 Visit Report for 07/11/2023 Chief Complaint Document Details Patient Name: Date of Service: [name], [name] 07/11/2023 9:30 A M Medical Record Number: [id number] Patient Account Number: [id number] Date of Birth/Sex: Treating RN: June 24, 1956 (68 y.o. F) Primary Care Provider: Alfredo Martinez Other Clinician: Referring Provider: Treating Provider/Extender: Dan Europe, [name] in Treatment: 10 Information Obtained from: Patient Chief Complaint Patient presents for treatment of an open ulcer due to venous insufficiency Electronic Signature(s) Signed: 07/11/2023 10:17:22 AM By: Duanne Guess MD FACS Entered By: Duanne Guess on 07/11/2023 10:17:22 -------------------------------------------------------------------------------- Debridement Details Patient Name: Date of Service: [name] 07/11/2023 9:30 A M Medical Record Number: [id number] Patient Account Number: [id number] Date of Birth/Sex: Treating RN: 07-25-1955 (68 y.o. [name], [name] Primary Care Provider: Alfredo Martinez Other Clinician: Referring Provider: Treating Provider/Extender: Dan Europe, [name] in Treatment: 10 Debridement Performed for Assessment: Wound #10 Right,Lateral Foot Performed By: Physician Duanne Guess, MD The following information was scribed by: Zenaida Deed The information was scribed for: Duanne Guess Debridement Type: Debridement Severity of Tissue Pre Debridement: Fat layer exposed Level of Consciousness (Pre-procedure): Awake and Alert Pre-procedure Verification/Time Out Yes - 10:35 Taken: Start Time: 10:38 [name] Control: Lidocaine 4% T opical Solution Percent of Wound Bed Debrided: 200% T Area Debrided (cm): otal 0.44 Tissue and other material debrided: Viable, Non-Viable, Callus, Slough, Subcutaneous, Skin: Epidermis, Slough Level: Skin/Subcutaneous  Tissue Debridement Description: Excisional Instrument: Curette Bleeding: Minimum Hemostasis Achieved: Pressure Procedural [name]: 0 Post Procedural [name]: 0 Response to Treatment: Procedure was tolerated well Level of Consciousness (Post- Awake and Alert procedure): Post Debridement Measurements of Total Wound Length: (cm) 0.7 Width: (cm) 0.4 Depth: (cm) 0.1 Volume: (cm) 0.022 [name], [name] ([id number]) [id number] Page 2 of 9 Character of Wound/Ulcer Post Debridement: Improved Severity of Tissue Post Debridement: Fat layer exposed Post Procedure Diagnosis Same as Pre-procedure Electronic Signature(s) Signed: 07/11/2023 11:49:55 AM By: Duanne Guess MD FACS Signed: 07/11/2023 1:37:16 PM By: Zenaida Deed RN, BSN Entered By: Zenaida Deed on 07/11/2023 10:41:48 -------------------------------------------------------------------------------- Debridement Details Patient Name: Date of Service: [name] 07/11/2023 9:30 A M Medical Record Number: [id number] Patient Account Number: [id number] Date of Birth/Sex: Treating RN: [birth year] (68 y.o. [name] Primary Care Provider: Alfredo Martinez Other Clinician: Referring Provider: Treating Provider/Extender: Dan Europe, [name] in Treatment: 10 Debridement Performed for Assessment: Wound #9 Right,Distal,Anterior Lower Leg Performed By: Physician Duanne Guess, MD The following information was scribed by: Zenaida Deed The information was scribed for: Duanne Guess Debridement Type: Debridement Severity of Tissue Pre Debridement: Muscle involvement without necrosis Level of Consciousness (Pre-procedure): Awake and Alert Pre-procedure Verification/Time Out Yes - 10:35 Taken: Start Time: 10:38 [name] Control: Lidocaine 4% T opical Solution Percent of Wound Bed Debrided: 100% T Area Debrided (cm): otal 2.83 Tissue and other material debrided: Viable, Non-Viable,  Slough, Subcutaneous, Tendon, Slough Level: Skin/Subcutaneous Tissue/Muscle Debridement Description: Excisional Instrument: Curette Bleeding: Minimum Hemostasis Achieved: Pressure Procedural [name]: 4 Post Procedural [name]: 3 Response to Treatment: Procedure was tolerated well Level of Consciousness (Post- Awake and Alert procedure): Post Debridement Measurements of Total Wound Length: (cm) 2 Width: (cm) 1.8 Depth: (cm) 0.2 Volume: (cm) 0.565 Character of Wound/Ulcer Post Debridement: Improved Severity of Tissue Post Debridement: Muscle involvement without necrosis Post Procedure Diagnosis Same as Pre-procedure Electronic Signature(s) Signed: 07/11/2023 11:49:55 AM By: Duanne Guess MD FACS Signed: 07/11/2023 1:37:16 PM By: Zenaida Deed RN, BSN Entered By: Zenaida Deed on 07/11/2023 10:44:15 [name], [name] (  [id number]) [id number] Page 3 of 9 -------------------------------------------------------------------------------- HPI Details Patient Name: Date of Service: [name], [name] 07/11/2023 9:30 A M Medical Record Number: [id number] Patient Account Number: [id number] Date of Birth/Sex: Treating RN: 1955-10-30 (68 y.o. F) Primary Care Provider: Alfredo Martinez Other Clinician: Referring Provider: Treating Provider/Extender: Dan Europe, [name] in Treatment: 10 History of Present Illness HPI Description: ADMISSION 05/02/2023 ***ABIs R: 0.88, palpable pulses*** This is a 68 year old woman with well-controlled type 2 diabetes (last hemoglobin A1c 5.4%) presenting to the wound care center with two ulcers on her right lower leg. She saw her primary care provider earlier this week and per their note, she has had lower leg swelling and erythema for about 2 months. Her PCP prescribed Augmentin and doxycycline. 05/12/2023: Both wounds measured smaller today. There is some slough and eschar accumulation on each. She saw her PCP who removed  our wraps for some reason and therefore her edema control is not ideal. 05/20/2023: The proximal wound measured smaller today. The distal wound measured slightly larger, but this does incorporate a fair amount of scab/eschar. Edema control is much better this week. 12/3; the patient was in for a nurse visit to change her dressings today. I was asked to look at the wound which had a large dried eschar over the surface on the right anterior lower leg I suspect this is mostly dried blood from last week. 06/13/2023: The proximal ulcer is nearly healed. The distal ulcer has a large eschar over it with a much smaller underlying actual wound. T endon is exposed at the base of the wound. She also has a new wound on her right lateral foot. It looks as if it started out as a blister that subsequently ruptured. She is complaining of more neuropathic-type [name] in both of her feet. 06/19/2023: The proximal ulcer has just a tiny bit of scab on the surface. The distal ulcer has a little bit less eschar present today, but the tendon that is exposed is grayish in color. The wound on her right lateral foot is superficial with a little eschar and slough present. She continues to complain of neuropathic [name], but has not discussed her gabapentin dosage with her PCP. 06/27/2023: The proximal ulcer is healed. The distal ulcer has less crusty dark eschar present. The exposed tendon looks healthier today. There is still a fair amount of slough accumulation on this wound. The wound on her right lateral foot has some slough accumulation but is much less tender. 07/04/2023: There is a fair amount of callus accumulation around the lateral foot ulcer. The surface of this wound is fairly pale. There is crusted dried blood around the anterior tibial ulcer. Tendon remains exposed at the base of the wound underneath a layer of slough. 07/11/2023: The biopsy that I took last week returned consistent with squamous cell carcinoma, with  peripheral and deep margins involved. T oday, that site has a lot of charred tissue from the silver nitrate required to stop the bleeding from her biopsy. There is some slough on the surface, as well. Tendon remains exposed. The wound on the side of her foot, however, is smaller. There is some callus accumulation around the edges and slough on the surface. Electronic Signature(s) Signed: 07/11/2023 10:47:21 AM By: Duanne Guess MD FACS Previous Signature: 07/11/2023 10:18:21 AM Version By: Duanne Guess MD FACS Entered By: Duanne Guess on 07/11/2023 10:47:21 -------------------------------------------------------------------------------- Physical Exam Details Patient Name: Date of Service: [name] 07/11/2023 9:30 A M Medical Record Number: [id number]  Patient Account Number: [id number] Date of Birth/Sex: Treating RN: 1955-10-22 (68 y.o. F) Primary Care Provider: Alfredo Martinez Other Clinician: Referring Provider: Treating Provider/Extender: Dan Europe, [name] in Treatment: 10 Constitutional Slightly hypertensive. . . . no acute distress. Respiratory Normal work of breathing on room air.[name], [name] ([id number]) [id number] Page 4 of 9 Notes 07/11/2023: The biopsy that I took last week returned consistent with squamous cell carcinoma, with peripheral and deep margins involved. T oday, that site has a lot of charred tissue from the silver nitrate required to stop the bleeding from her biopsy. There is some slough on the surface, as well. Tendon remains exposed. The wound on the side of her foot, however, is smaller. There is some callus accumulation around the edges and slough on the surface. Electronic Signature(s) Signed: 07/11/2023 10:47:58 AM By: Duanne Guess MD FACS Entered By: Duanne Guess on 07/11/2023 10:47:58 -------------------------------------------------------------------------------- Physician Orders  Details Patient Name: Date of Service: [name], [name] 07/11/2023 9:30 A M Medical Record Number: [id number] Patient Account Number: [id number] Date of Birth/Sex: Treating RN: 1955/10/31 (68 y.o. 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 - Dr. Lady Gary Return A Anesthetic (In clinic) Topical Lidocaine 4% applied to wound bed Bathing/ Shower/ Hygiene May shower and wash wound with soap and water. Edema Control - Orders / Instructions Elevate legs to the level of the heart or above for 30 minutes daily and/or when sitting for 3-4 times a day throughout the day. Avoid standing for long periods of time. Exercise regularly Wound Treatment Wound #10 - Foot Wound Laterality: Right, Lateral Peri-Wound Care: Sween Lotion (Moisturizing lotion) Every Other Day/30 Days Discharge Instructions: Apply moisturizing lotion as directed Prim Dressing: Maxorb Extra Ag+ Alginate Dressing, 2x2 (in/in) (DME) (Dispense As Written) Every Other Day/30 Days ary Discharge Instructions: Apply to wound bed as instructed Secondary Dressing: Zetuvit Plus Silicone Border Dressing 4x4 (in/in) (DME) (Dispense As Written) Every Other Day/30 Days Discharge Instructions: Apply silicone border over primary  dressing as directed. Wound #9 - Lower Leg Wound Laterality: Right, Anterior, Distal Cleanser: Soap and Water Every Other Day/30 Days Discharge Instructions: May shower and wash wound with dial antibacterial soap and water prior to dressing change. Cleanser: Wound Cleanser Every Other Day/30 Days Discharge Instructions: Cleanse the wound with wound cleanser prior to applying a clean dressing using gauze sponges, not tissue or cotton balls. Prim Dressing: Maxorb Extra Ag+ Alginate Dressing, 2x2 (in/in) (DME) (Dispense As Written) Every Other Day/30 Days ary Discharge Instructions: Apply to wound bed as instructed [name], [name] ([id number]) [id number] Page 5 of 9 Secondary Dressing: Zetuvit Plus Silicone Border Dressing 4x4 (in/in) (DME) (Generic) Every Other Day/30 Days Discharge Instructions: Apply silicone border over primary dressing as directed. Electronic Signature(s) Signed: 07/11/2023 11:49:55 AM By: Duanne Guess MD FACS Entered By: Duanne Guess on 07/11/2023 10:49:59 -------------------------------------------------------------------------------- Problem List Details Patient Name: Date of Service: [name], [name].  07/11/2023 9:30 A M Medical Record Number: [id number] Patient Account Number: [id number] Date of Birth/Sex: Treating RN: May 22, 1956 (68 y.o. F) Primary Care Provider: Alfredo Martinez Other Clinician: Referring Provider: Treating Provider/Extender: Dan Europe, [name] in Treatment: 10 Active Problems ICD-10 Encounter Code Description Active Date MDM Diagnosis C44.722 Squamous cell carcinoma of skin of right lower limb, including hip 07/11/2023 No Yes L97.815 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 06/13/2023 No Yes involvement without evidence of necrosis L97.512 Non-pressure chronic ulcer of other part of right foot with fat layer exposed 06/13/2023 No Yes E11.622 Type 2 diabetes  mellitus with other skin ulcer 05/02/2023 No Yes R60.0 Localized edema 05/02/2023 No Yes Inactive Problems ICD-10 Code Description Active Date Inactive Date L97.812 Non-pressure chronic ulcer of other part of right lower leg with fat layer exposed 05/02/2023 05/02/2023 Resolved Problems Electronic Signature(s) Signed: 07/11/2023 10:17:02 AM By: Duanne Guess MD FACS Entered By: Duanne Guess on 07/11/2023 10:17:02 [name], [name] ([id number]) [id number] Page 6 of 9 -------------------------------------------------------------------------------- Progress Note Details Patient Name: Date of Service: [name], [name] 07/11/2023 9:30 A M Medical Record Number: [id number] Patient Account Number: [id number] Date of Birth/Sex: Treating RN: 1955/11/14 (68 y.o. F) Primary Care Provider: Alfredo Martinez Other Clinician: Referring Provider: Treating Provider/Extender: Dan Europe, [name] in Treatment: 10 Subjective Chief Complaint Information obtained from Patient Patient presents for treatment of an open ulcer due to venous insufficiency History of Present Illness (HPI) ADMISSION 05/02/2023 ***ABIs R: 0.88, palpable pulses*** This is a 68 year old woman with well-controlled type 2 diabetes (last hemoglobin A1c 5.4%) presenting to the wound care center with two ulcers on her right lower leg. She saw her primary care provider earlier this week and per their note, she has had lower leg swelling and erythema for about 2 months. Her PCP prescribed Augmentin and doxycycline. 05/12/2023: Both wounds measured smaller today. There is some slough and eschar accumulation on each. She saw her PCP who removed our wraps for some reason and therefore her edema control is not ideal. 05/20/2023: The proximal wound measured smaller today. The distal wound measured slightly larger, but this does incorporate a fair amount of scab/eschar. Edema control is much better this  week. 12/3; the patient was in for a nurse visit to change her dressings today. I was asked to look at the wound which had a large dried eschar over the surface on the right anterior lower leg I suspect this is mostly dried blood from last week. 06/13/2023: The proximal ulcer is nearly healed. The distal ulcer has a large eschar over it with a much smaller underlying actual wound. T endon is exposed at the base of the wound. She also has a new wound on her right lateral foot. It looks as if it started out as a blister that subsequently ruptured. She is complaining of more neuropathic-type [name] in both of her feet. 06/19/2023: The proximal ulcer has just a tiny bit of scab on the surface. The distal ulcer has a little bit less eschar present today, but the tendon that is exposed is grayish in color. The wound on her right lateral foot is superficial with a little eschar and slough present. She continues to complain of neuropathic [name], but has not discussed her gabapentin dosage with her PCP. 06/27/2023: The proximal ulcer is healed. The distal ulcer has less crusty dark eschar present. The exposed tendon looks healthier today. There is still a fair amount of slough accumulation on this wound.  The wound on her right lateral foot has some slough accumulation but is much less tender. 07/04/2023: There is a fair amount of callus accumulation around the lateral foot ulcer. The surface of this wound is fairly pale. There is crusted dried blood around the anterior tibial ulcer. Tendon remains exposed at the base of the wound underneath a layer of slough. 07/11/2023: The biopsy that I took last week returned consistent with squamous cell carcinoma, with peripheral and deep margins involved. T oday, that site has a lot of charred tissue from the silver nitrate required to stop the bleeding from her biopsy. There is some slough on the surface, as well. Tendon remains exposed. The wound on the side of her foot,  however, is smaller. There is some callus accumulation around the edges and slough on the surface. Objective Constitutional Slightly hypertensive. no acute distress. Vitals Time Taken: 9:55 AM, Height: 70 in, Weight: 166 lbs, BMI: 23.8, Temperature: 98.0 F, Pulse: 88 bpm, Respiratory Rate: 18 breaths/min, Blood Pressure: 147/70 mmHg. Respiratory Normal work of breathing on room air.. General Notes: 07/11/2023: The biopsy that I took last week returned consistent with squamous cell carcinoma, with peripheral and deep margins involved. Today, that site has a lot of charred tissue from the silver nitrate required to stop the bleeding from her biopsy. There is some slough on the surface, as well. Tendon remains exposed. The wound on the side of her foot, however, is smaller. There is some callus accumulation around the edges and slough on the surface. Integumentary (Hair, Skin) Wound #10 status is Open. Original cause of wound was Gradually Appeared. The date acquired was: 06/13/2023. The wound has been in treatment 4 weeks. The wound is located on the Right,Lateral Foot. The wound measures 0.7cm length x 0.4cm width x 0.1cm depth; 0.22cm^2 area and 0.022cm^3 volume. There Smock, Artasia [name] ([id number]) [id number].pdf Page 7 of 9 is Fat Layer (Subcutaneous Tissue) exposed. There is no tunneling or undermining noted. There is a medium amount of serosanguineous drainage noted. The wound margin is thickened. There is medium (34-66%) pink granulation within the wound bed. There is a medium (34-66%) amount of necrotic tissue within the wound bed including Adherent Slough. The periwound skin appearance had no abnormalities noted for moisture. The periwound skin appearance had no abnormalities noted for color. The periwound skin appearance exhibited: Callus. The periwound skin appearance did not exhibit: Crepitus, Excoriation, Induration, Rash, Scarring. Periwound temperature was noted  as No Abnormality. The periwound has tenderness on palpation. Wound #9 status is Open. Original cause of wound was Trauma. The date acquired was: 04/10/2023. The wound has been in treatment 10 weeks. The wound is located on the Right,Distal,Anterior Lower Leg. The wound measures 2cm length x 1.8cm width x 0.2cm depth; 2.827cm^2 area and 0.565cm^3 volume. There is muscle, tendon, and Fat Layer (Subcutaneous Tissue) exposed. There is no tunneling or undermining noted. There is a medium amount of serosanguineous drainage noted. The wound margin is distinct with the outline attached to the wound base. There is large (67-100%) red granulation within the wound bed. There is a small (1-33%) amount of necrotic tissue within the wound bed including Eschar, Adherent Slough and Necrosis of Muscle. The periwound skin appearance had no abnormalities noted for texture. The periwound skin appearance had no abnormalities noted for moisture. The periwound skin appearance exhibited: Rubor. Periwound temperature was noted as No Abnormality. The periwound has tenderness on palpation. 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 There was a Excisional Skin/Subcutaneous Tissue Debridement with a total area of 0.44 sq cm performed by Duanne Guess, MD. With the following instrument(s): Curette to remove Viable and Non-Viable tissue/material. Material removed includes Callus, Subcutaneous Tissue, Slough, and Skin:  Epidermis after achieving [name] control using Lidocaine 4% Topical Solution. No specimens were taken. A time out was conducted at 10:35, prior to the start of the procedure. A Minimum amount of bleeding was controlled with Pressure. The procedure was tolerated well with a [name] level of 0 throughout and a [name] level of 0 following the procedure. Post Debridement Measurements: 0.7cm length x 0.4cm width x 0.1cm depth; 0.022cm^3 volume. Character of Wound/Ulcer Post Debridement is improved. Severity of Tissue Post Debridement is: Fat layer exposed. Post procedure Diagnosis Wound #10: Same as Pre-Procedure Wound #9 Pre-procedure diagnosis of Wound #9 is a Venous Leg Ulcer located on the Right,Distal,Anterior Lower Leg .Severity of Tissue Pre Debridement is: Muscle involvement without necrosis. There was a Excisional Skin/Subcutaneous Tissue/Muscle Debridement with a total area of 2.83 sq cm performed by Duanne Guess, MD. With the following instrument(s): Curette to remove Viable and Non-Viable tissue/material. Material removed includes Tendon, Subcutaneous Tissue, and Slough after achieving [name] control using Lidocaine 4% Topical Solution. No specimens were taken. A time out was conducted at 10:35, prior to the start of the procedure. A Minimum amount of bleeding was controlled with Pressure. The procedure was tolerated well with a [name] level of 4 throughout and a [name] level of 3 following the procedure. Post Debridement Measurements: 2cm length x 1.8cm width x 0.2cm depth; 0.565cm^3 volume. Character of Wound/Ulcer Post Debridement is improved. Severity of Tissue Post Debridement is: Muscle involvement without necrosis. Post procedure Diagnosis Wound #9: Same as Pre-Procedure Plan Follow-up Appointments: Return Appointment in 1 week. - Dr. Lady Gary Anesthetic: (In clinic) Topical Lidocaine 4% applied to wound bed Bathing/ Shower/ Hygiene: May shower and wash wound with soap and water. Edema  Control - Orders / Instructions: Elevate legs to the level of the heart or above for 30 minutes daily and/or when sitting for 3-4 times a day throughout the day. Avoid standing for long periods of time. Exercise regularly WOUND #10: - Foot Wound Laterality: Right, Lateral Peri-Wound Care: Sween Lotion (Moisturizing lotion) Every Other Day/30 Days Discharge Instructions: Apply moisturizing lotion as directed Prim Dressing: Maxorb Extra Ag+ Alginate Dressing, 2x2 (in/in) (DME) (Dispense As Written) Every Other Day/30 Days ary Discharge Instructions: Apply to wound bed as instructed Secondary Dressing: Zetuvit Plus Silicone Border Dressing 4x4 (in/in) (DME) (Dispense As Written) Every Other Day/30 Days Discharge Instructions: Apply silicone border over primary dressing as directed. WOUND #9: - Lower Leg Wound Laterality: Right, Anterior, Distal Cleanser: Soap and Water Every Other Day/30 Days Discharge Instructions: May shower and wash wound with dial antibacterial soap and water prior to dressing change. Cleanser: Wound Cleanser Every Other Day/30 Days Discharge Instructions: Cleanse the wound with wound cleanser prior to applying a clean dressing using gauze sponges, not tissue or cotton balls. Prim Dressing: Maxorb Extra Ag+ Alginate Dressing, 2x2 (in/in) (DME) (Dispense As Written) Every  Other Day/30 Days ary Discharge Instructions: Apply to wound bed as instructed Secondary Dressing: Zetuvit Plus Silicone Border Dressing 4x4 (in/in) (DME) (Generic) Every Other Day/30 Days [name], [name] ([id number]) [id number] Page 8 of 9 Discharge Instructions: Apply silicone border over primary dressing as directed. 07/11/2023: The biopsy that I took last week returned consistent with squamous cell carcinoma, with peripheral and deep margins involved. T oday, that site has a lot of charred tissue from the silver nitrate required to stop the bleeding from her biopsy. There is  some slough on the surface, as well. Tendon remains exposed. The wound on the side of her foot, however, is smaller. There is some callus accumulation around the edges and slough on the surface. I used a curette to debride slough, subcutaneous tissue, and tendon from the anterior tibial wound. I debrided callus, skin, slough, and subcutaneous tissue from the lateral foot wound. I would to change her contact layer to silver alginate for both. We have placed a referral to the skin surgery center for her to undergo resection of the wound on her anterior tibia. Follow-up in 1 week. Electronic Signature(s) Signed: 07/11/2023 10:50:45 AM By: Duanne Guess MD FACS Entered By: Duanne Guess on 07/11/2023 10:50:45 -------------------------------------------------------------------------------- SuperBill Details Patient Name: Date of Service: [name] 07/11/2023 Medical Record Number: [id number] Patient Account Number: [id number] Date of Birth/Sex: Treating RN: 1956-05-17 (68 y.o. F) Primary Care Provider: Alfredo Martinez Other Clinician: Referring Provider: Treating Provider/Extender: Dan Europe, [name] in Treatment: 10 Diagnosis Coding ICD-10 Codes Code Description [phone] Squamous cell carcinoma of skin of right lower limb, including hip L97.815 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 without evidence of necrosis L97.512 Non-pressure chronic ulcer of other part of right foot with fat layer exposed E11.622 Type 2 diabetes mellitus with other skin ulcer R60.0 Localized edema Facility Procedures : 3 CPT4 Code: 6440347 Description: 11042 - DEB SUBQ TISSUE 20 SQ CM/< ICD-10 Diagnosis Description L97.512 Non-pressure chronic ulcer of other part of right foot with fat layer exposed Modifier: Quantity: 1 : 3 CPT4 Code: 4259563 Description: 11043 - DEB MUSC/FASCIA 20 SQ CM/< ICD-10 Diagnosis Description L97.815 Non-pressure  chronic ulcer of other part of right lower leg with muscle involvemen Modifier: t without evidence of Quantity: 1 necrosis Physician Procedures : CPT4 Code Description Modifier [id number] 99214 - WC PHYS LEVEL 4 - EST PT 25 ICD-10 Diagnosis Description C44.722 Squamous cell carcinoma of skin of right lower limb, including hip L97.815 Non-pressure chronic ulcer of other part of right lower leg with  muscle involvement without evidence of L97.512 Non-pressure chronic ulcer of other part of right foot with fat layer exposed E11.622 Type 2 diabetes mellitus with other skin ulcer Quantity: 1 necrosis : [id number] 11042 - WC PHYS SUBQ TISS 20 SQ CM ICD-10 Diagnosis Description L97.512 Non-pressure chronic ulcer of other part of right foot with fat layer exposed Quantity: 1 : [id number] 11043 - WC PHYS DEBR MUSCLE/FASCIA 20 SQ CM ICD-10 Diagnosis Description [name], [name] [name] ([id number]) [id number] ICD-10 Diagnosis Description L97.815 Non-pressure chronic ulcer of other part of right lower leg with  muscle involvement without evidence of necro Quantity: 1 Page 9 of 9 sis Electronic Signature(s) Signed: 07/11/2023 10:51:05 AM By: Duanne Guess MD FACS Entered By: Duanne Guess on 07/11/2023 10:51:04

## 2023-07-11 NOTE — Progress Notes (Signed)
[name], [name] ([id number]) [phone].pdf Page 1 of 8 Visit Report for 07/11/2023 Arrival Information Details Patient Name: Date of Service: [name], ECKERLE 07/11/2023 9:30 A M Medical Record Number: [id number] Patient Account Number: [id number] Date of Birth/Sex: Treating RN: 28-May-1956 (68 y.o. F) Primary Care Kimesha Claxton: Alfredo Martinez Other Clinician: Referring Zoe Nordin: Treating Kabrina Christiano/Extender: Dan Europe, [name] in Treatment: 10 Visit Information History Since Last Visit Added or deleted any medications: No Patient Arrived: Ambulatory Any new allergies or adverse reactions: No Arrival Time: 09:55 Had a fall or experienced change in No Accompanied By: self activities of daily living that may affect Transfer Assistance: None risk of falls: Patient Identification Verified: Yes Signs or symptoms of abuse/neglect since last visito No Secondary Verification Process Completed: Yes Hospitalized since last visit: No Patient Requires Transmission-Based Precautions: No Implantable device outside of the clinic excluding No Patient Has Alerts: Yes cellular tissue based products placed in the center Patient Alerts: Patient on Blood Thinner since last visit: Has Dressing in Place as Prescribed: Yes Has Compression in Place as Prescribed: Yes Pain Present Now: No Electronic Signature(s) Signed: 07/11/2023 1:37:16 PM By: Zenaida Deed RN, BSN Entered By: Zenaida Deed on 07/11/2023 10:26:43 -------------------------------------------------------------------------------- Lower Extremity Assessment Details Patient Name: Date of Service: [name], [name] 07/11/2023 9:30 A M Medical Record Number: [id number] Patient Account Number: [id number] Date of Birth/Sex: Treating RN: [birth year] (68 y.o. [name] Primary Care Lanique Gonzalo: Alfredo Martinez Other Clinician: Referring Agam Tuohy: Treating Rosalene Wardrop/Extender: Dan Europe,  [name] in Treatment: 10 Edema Assessment Assessed: [Left: No] [Right: No] Edema: [Left: Ye] [Right: s] Calf Left: Right: Point of Measurement: 36 cm From Medial Instep 34.5 cm Ankle Left: Right: Point of Measurement: 9 cm From Medial Instep 24.5 cm Vascular Assessment Pulses: Dorsalis Pedis Palpable: [Right:Yes] Difabio, Sharday L ([id number]) [Right:[id number] Page 2 of 8] Extremity colors, hair growth, and conditions: Extremity Color: [Right:Red] Hair Growth on Extremity: [Right:No] Temperature of Extremity: [Right:Warm] Capillary Refill: [Right:< 3 seconds] Dependent Rubor: [Right:No No] Electronic Signature(s) Signed: 07/11/2023 1:37:16 PM By: Zenaida Deed RN, BSN Entered By: Zenaida Deed on 07/11/2023 10:29:22 -------------------------------------------------------------------------------- Multi Wound Chart Details Patient Name: Date of Service: [name] 07/11/2023 9:30 A M Medical Record Number: [id number] Patient Account Number: [id number] Date of Birth/Sex: Treating RN: 04-14-1956 (68 y.o. F) Primary Care Dawnielle Christiana: Alfredo Martinez Other Clinician: Referring Guadalupe Kerekes: Treating Grove Defina/Extender: Dan Europe, [name] in Treatment: 10 Vital Signs Height(in): 70 Pulse(bpm): 88 Weight(lbs): 166 Blood Pressure(mmHg): 147/70 Body Mass Index(BMI): 23.8 Temperature(F): 98.0 Respiratory Rate(breaths/min): 18 [10:Photos:] [N/A:N/A] Right, Lateral Foot Right, Distal, Anterior Lower Leg N/A Wound Location: Gradually Appeared Trauma N/A Wounding Event: Diabetic Wound/Ulcer of the Lower Venous Leg Ulcer N/A Primary Etiology: Extremity N/A Diabetic Wound/Ulcer of the Lower N/A Secondary Etiology: Extremity Cataracts, Anemia, Sleep Apnea, Cataracts, Anemia, Sleep Apnea, N/A Comorbid History: Arrhythmia, Coronary Artery Disease, Arrhythmia, Coronary Artery Disease, Hypertension, Peripheral Venous Hypertension,  Peripheral Venous Disease, Type II Diabetes, Disease, Type II Diabetes, Osteoarthritis, Neuropathy Osteoarthritis, Neuropathy 06/13/2023 04/10/2023 N/A Date Acquired: 4 10 N/A Weeks of Treatment: Open Open N/A Wound Status: No No N/A Wound Recurrence: 0.7x0.4x0.1 2x1.8x0.2 N/A Measurements L x W x D (cm) 0.22 2.827 N/A A (cm) : rea 0.022 0.565 N/A Volume (cm) : 50.00% 13.10% N/A % Reduction in Area: 50.00% -73.80% N/A % Reduction in Volume: Grade 1 Full Thickness Without Exposed N/A Classification: Support Structures Medium Medium N/A Exudate A mount: Serosanguineous Serosanguineous N/A Exudate Type: red, brown red, brown N/A Exudate Color: Fibrotic  scar, thickened scar Fibrotic scar, thickened scar N/A Wound Margin: None Present (0%) Large (67-100%) N/A Granulation Amount: N/A Red N/A Granulation Quality: Large (67-100%) Small (1-33%) N/A Necrotic Amount: Adherent Slough Eschar, Adherent Slough N/A Necrotic Tissue: Fat Layer (Subcutaneous Tissue): Yes Fat Layer (Subcutaneous Tissue): Yes N/A Exposed Structures: ZYLA, ZARZA L ([id number]) [id number] Page 3 of 8 Fascia: No Fascia: No Tendon: No Tendon: No Muscle: No Muscle: No Joint: No Joint: No Bone: No Bone: No Small (1-33%) Small (1-33%) N/A Epithelialization: Callus: Yes No Abnormalities Noted N/A Periwound Skin Texture: Excoriation: No Induration: No Crepitus: No Rash: No Scarring: No No Abnormalities Noted No Abnormalities Noted N/A Periwound Skin Moisture: No Abnormalities Noted No Abnormalities Noted N/A Periwound Skin Color: No Abnormality No Abnormality N/A Temperature: Treatment Notes Electronic Signature(s) Signed: 07/11/2023 10:17:15 AM By: Duanne Guess MD FACS Entered By: Duanne Guess on 07/11/2023 10:17:15 -------------------------------------------------------------------------------- Multi-Disciplinary Care Plan Details Patient Name: Date of  Service: [name] 07/11/2023 9:30 A M Medical Record Number: [id number] Patient Account Number: [id number] Date of Birth/Sex: Treating RN: [birth year] (68 y.o. [name] Primary Care Yuri Fana: Alfredo Martinez Other Clinician: Referring Taevion Sikora: Treating Luetta Piazza/Extender: Candelaria Celeste in Treatment: 10 Multidisciplinary Care Plan reviewed with physician Active Inactive Malignancy/Atypical Etiology Nursing Diagnoses: Knowledge deficit related to disease process and management of malignancy Goals: Patient/caregiver will verbalize understanding of disease process and disease management of malignancy Date Initiated: 07/11/2023 Target Resolution Date: 08/08/2023 Goal Status: Active Interventions: Assess patient and family medical history for signs and symptoms of malignancy/atypical etiology upon admission Provide education on malignant ulcerations Treatment Activities: Lab evaluations : 07/11/2023 Notes: Venous Leg Ulcer Nursing Diagnoses: Knowledge deficit related to disease process and management Potential for venous Insuffiency (use before diagnosis confirmed) Goals: Patient will maintain optimal edema control Date Initiated: 06/27/2023 Target Resolution Date: 07/25/2023 Goal Status: Active Interventions: Assess peripheral edema status every visit. Compression as ordered ABRIANNA, BEARMAN L ([id number]) [id number] Page 4 of 8 Treatment Activities: Therapeutic compression applied : 06/27/2023 Notes: Wound/Skin Impairment Nursing Diagnoses: Impaired tissue integrity Knowledge deficit related to ulceration/compromised skin integrity Goals: Patient/caregiver will verbalize understanding of skin care regimen Date Initiated: 05/12/2023 Target Resolution Date: 08/22/2023 Goal Status: Active Ulcer/skin breakdown will heal within 14 weeks Date Initiated: 05/12/2023 Date Inactivated: 06/13/2023 Target Resolution Date:  06/06/2023 Goal Status: Unmet Unmet Reason: continue wound care Interventions: Assess ulceration(s) every visit Treatment Activities: Skin care regimen initiated : 05/12/2023 Topical wound management initiated : 05/12/2023 Notes: Electronic Signature(s) Signed: 07/11/2023 1:37:16 PM By: Zenaida Deed RN, BSN Entered By: Zenaida Deed on 07/11/2023 10:32:32 -------------------------------------------------------------------------------- Pain Assessment Details Patient Name: Date of Service: [name] 07/11/2023 9:30 A M Medical Record Number: [id number] Patient Account Number: [id number] Date of Birth/Sex: Treating RN: 1956-03-15 (68 y.o. F) Primary Care Tagen Brethauer: Alfredo Martinez Other Clinician: Referring Chamberlain Steinborn: Treating Stevi Hollinshead/Extender: Dan Europe, [name] in Treatment: 10 Active Problems Location of Pain Severity and Description of Pain Patient Has Paino No Site Locations Rate the pain. Current Pain Level: 0 Pain Management and Medication Current Pain Management: CRYSTINE, ZOBRIST ([id number]) [phone].pdf Page 5 of 8 Electronic Signature(s) Signed: 07/11/2023 1:37:16 PM By: Zenaida Deed RN, BSN Entered By: Zenaida Deed on 07/11/2023 10:27:24 -------------------------------------------------------------------------------- Patient/Caregiver Education Details Patient Name: Date of Service: [name] 1/17/2025andnbsp9:30 A M Medical Record Number: [id number] Patient Account Number: [id number] Date of Birth/Gender: Treating RN: 1956/03/01 (68 y.o. [name] Primary Care Physician: Alfredo Martinez Other Clinician: Referring Physician: Treating Physician/Extender: Dan Europe, Allee  Weeks in Treatment: 10 Education Assessment Education Provided To: Patient Education Topics Provided Malignant/Atypical Wounds: Handouts: Malignant/Atypical Wounds Methods: Explain/Verbal Responses:  Reinforcements needed, State content correctly Wound/Skin Impairment: Methods: Explain/Verbal Responses: Reinforcements needed, State content correctly Electronic Signature(s) Signed: 07/11/2023 1:37:16 PM By: Zenaida Deed RN, BSN Entered By: Zenaida Deed on 07/11/2023 10:32:55 -------------------------------------------------------------------------------- Wound Assessment Details Patient Name: Date of Service: [name]. 07/11/2023 9:30 A M Medical Record Number: [id number] Patient Account Number: [id number] Date of Birth/Sex: Treating RN: [birth year] (68 y.o. F) Primary Care Amil Moseman: Alfredo Martinez Other Clinician: Referring Loza Prell: Treating Arohi Salvatierra/Extender: Dan Europe, [name] in Treatment: 10 Wound Status Wound Number: 10 Primary Diabetic Wound/Ulcer of the Lower Extremity Etiology: Wound Location: Right, Lateral Foot Wound Open Wounding Event: Gradually Appeared Status: Date Acquired: 06/13/2023 Comorbid Cataracts, Anemia, Sleep Apnea, Arrhythmia, Coronary Artery Weeks Of Treatment: 4 History: Disease, Hypertension, Peripheral Venous Disease, Type II Clustered Wound: No Diabetes, Osteoarthritis, Neuropathy Photos ACSA, ARANGO L ([id number]) [id number] Page 6 of 8 Wound Measurements Length: (cm) 0.7 Width: (cm) 0.4 Depth: (cm) 0.1 Area: (cm) 0.22 Volume: (cm) 0.022 % Reduction in Area: 50% % Reduction in Volume: 50% Epithelialization: Small (1-33%) Tunneling: No Undermining: No Wound Description Classification: Grade 1 Wound Margin: Thickened Exudate Amount: Medium Exudate Type: Serosanguineous Exudate Color: red, brown Foul Odor After Cleansing: No Slough/Fibrino Yes Wound Bed Granulation Amount: Medium (34-66%) Exposed Structure Granulation Quality: Pink Fascia Exposed: No Necrotic Amount: Medium (34-66%) Fat Layer (Subcutaneous Tissue) Exposed: Yes Necrotic Quality: Adherent Slough Tendon  Exposed: No Muscle Exposed: No Joint Exposed: No Bone Exposed: No Periwound Skin Texture Texture Color No Abnormalities Noted: No No Abnormalities Noted: Yes Callus: Yes Temperature / Pain Crepitus: No Temperature: No Abnormality Excoriation: No Tenderness on Palpation: Yes Induration: No Rash: No Scarring: No Moisture No Abnormalities Noted: Yes Electronic Signature(s) Signed: 07/11/2023 1:37:16 PM By: Zenaida Deed RN, BSN Entered By: Zenaida Deed on 07/11/2023 10:30:25 -------------------------------------------------------------------------------- Wound Assessment Details Patient Name: Date of Service: [name] 07/11/2023 9:30 A M Medical Record Number: [id number] Patient Account Number: [id number] Date of Birth/Sex: Treating RN: 23-Apr-1956 (68 y.o. F) Primary Care Hanz Winterhalter: Alfredo Martinez Other Clinician: Referring Tailey Top: Treating Accalia Rigdon/Extender: Dan Europe, [name] in Treatment: 10 Wound Status Wound Number: 9 Primary Venous Leg Ulcer Etiology: Wound Location: Right, Distal, Anterior Lower Leg Secondary Diabetic Wound/Ulcer of the Lower Extremity Wounding Event: Trauma Etiology: SHAQUITA, PECHER ([id number]) [id number] Page 7 of 8 Etiology: Date Acquired: 04/10/2023 Wound Open Weeks Of Treatment: 10 Status: Clustered Wound: No Comorbid Cataracts, Anemia, Sleep Apnea, Arrhythmia, Coronary Artery History: Disease, Hypertension, Peripheral Venous Disease, Type II Diabetes, Osteoarthritis, Neuropathy Photos Wound Measurements Length: (cm) 2 Width: (cm) 1.8 Depth: (cm) 0.2 Area: (cm) 2.827 Volume: (cm) 0.565 % Reduction in Area: 13.1% % Reduction in Volume: -73.8% Epithelialization: None Tunneling: No Undermining: No Wound Description Classification: Full Thickness With Exposed Suppo Wound Margin: Distinct, outline attached Exudate Amount: Medium Exudate Type: Serosanguineous Exudate Color:  red, brown rt Structures Foul Odor After Cleansing: No Slough/Fibrino Yes Wound Bed Granulation Amount: Large (67-100%) Exposed Structure Granulation Quality: Red Fascia Exposed: No Necrotic Amount: Small (1-33%) Fat Layer (Subcutaneous Tissue) Exposed: Yes Necrotic Quality: Eschar, Adherent Slough Tendon Exposed: Yes Muscle Exposed: Yes Necrosis of Muscle: Yes Joint Exposed: No Bone Exposed: No Periwound Skin Texture Texture Color No Abnormalities Noted: Yes No Abnormalities Noted: No Rubor: Yes Moisture No Abnormalities Noted: Yes Temperature / Pain Temperature: No Abnormality Tenderness on Palpation: Yes Electronic Signature(s) Signed: 07/11/2023 1:37:16 PM By:  Zenaida Deed RN, BSN Entered By: Zenaida Deed on 07/11/2023 10:31:18 -------------------------------------------------------------------------------- Vitals Details Patient Name: Date of Service: [name], [name] 07/11/2023 9:30 A M Medical Record Number: [id number] Patient Account Number: [id number] Date of Birth/Sex: Treating RN: July 23, 1955 (68 y.o. F) Primary Care Chrisangel Eskenazi: Alfredo Martinez Other Clinician: Referring Aztlan Coll: Treating Pearla Mckinny/Extender: Dan Europe, [name] in Treatment: [address] Swathi L ([id number]) [id number] Page 8 of 8 Vital Signs Time Taken: 09:55 Temperature (F): 98.0 Height (in): 70 Pulse (bpm): 88 Weight (lbs): 166 Respiratory Rate (breaths/min): 18 Body Mass Index (BMI): 23.8 Blood Pressure (mmHg): 147/70 Reference Range: 80 - 120 mg / dl Electronic Signature(s) Signed: 07/11/2023 1:37:16 PM By: Zenaida Deed RN, BSN Entered By: Zenaida Deed on 07/11/2023 10:27:13

## 2023-07-18 ENCOUNTER — Encounter (HOSPITAL_BASED_OUTPATIENT_CLINIC_OR_DEPARTMENT_OTHER): Payer: Medicare Other | Admitting: General Surgery

## 2023-07-18 DIAGNOSIS — E11622 Type 2 diabetes mellitus with other skin ulcer: Secondary | ICD-10-CM | POA: Diagnosis not present

## 2023-07-18 DIAGNOSIS — C44722 Squamous cell carcinoma of skin of right lower limb, including hip: Secondary | ICD-10-CM | POA: Diagnosis not present

## 2023-07-18 DIAGNOSIS — R600 Localized edema: Secondary | ICD-10-CM | POA: Diagnosis not present

## 2023-07-18 DIAGNOSIS — L97512 Non-pressure chronic ulcer of other part of right foot with fat layer exposed: Secondary | ICD-10-CM | POA: Diagnosis not present

## 2023-07-18 DIAGNOSIS — E11621 Type 2 diabetes mellitus with foot ulcer: Secondary | ICD-10-CM | POA: Diagnosis not present

## 2023-07-18 DIAGNOSIS — I872 Venous insufficiency (chronic) (peripheral): Secondary | ICD-10-CM | POA: Diagnosis not present

## 2023-07-18 DIAGNOSIS — L97815 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 without evidence of necrosis: Secondary | ICD-10-CM | POA: Diagnosis not present

## 2023-07-23 ENCOUNTER — Other Ambulatory Visit: Payer: Self-pay | Admitting: Student

## 2023-07-23 ENCOUNTER — Other Ambulatory Visit: Payer: Self-pay | Admitting: Cardiology

## 2023-07-23 DIAGNOSIS — I480 Paroxysmal atrial fibrillation: Secondary | ICD-10-CM

## 2023-07-23 DIAGNOSIS — R4586 Emotional lability: Secondary | ICD-10-CM

## 2023-07-23 NOTE — Telephone Encounter (Signed)
Eliquis 5mg  refill request received. Patient is 68 years old, weight-74.1kg, Crea-1.40 on 01/22/23, Diagnosis-Afib, and last seen by Sherie Don on 10/31/22. Dose is appropriate based on dosing criteria. Will send in refill to requested pharmacy.

## 2023-07-25 ENCOUNTER — Encounter (HOSPITAL_BASED_OUTPATIENT_CLINIC_OR_DEPARTMENT_OTHER): Payer: Medicare Other | Admitting: General Surgery

## 2023-07-25 DIAGNOSIS — L97512 Non-pressure chronic ulcer of other part of right foot with fat layer exposed: Secondary | ICD-10-CM | POA: Diagnosis not present

## 2023-07-25 DIAGNOSIS — L97813 Non-pressure chronic ulcer of other part of right lower leg with necrosis of muscle: Secondary | ICD-10-CM | POA: Diagnosis not present

## 2023-07-25 DIAGNOSIS — I872 Venous insufficiency (chronic) (peripheral): Secondary | ICD-10-CM | POA: Diagnosis not present

## 2023-07-25 DIAGNOSIS — R600 Localized edema: Secondary | ICD-10-CM | POA: Diagnosis not present

## 2023-07-25 DIAGNOSIS — E11622 Type 2 diabetes mellitus with other skin ulcer: Secondary | ICD-10-CM | POA: Diagnosis not present

## 2023-07-25 DIAGNOSIS — E11621 Type 2 diabetes mellitus with foot ulcer: Secondary | ICD-10-CM | POA: Diagnosis not present

## 2023-07-25 DIAGNOSIS — C44722 Squamous cell carcinoma of skin of right lower limb, including hip: Secondary | ICD-10-CM | POA: Diagnosis not present

## 2023-07-25 DIAGNOSIS — L97815 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 without evidence of necrosis: Secondary | ICD-10-CM | POA: Diagnosis not present

## 2023-07-29 ENCOUNTER — Encounter: Payer: Self-pay | Admitting: Student

## 2023-07-29 ENCOUNTER — Ambulatory Visit (HOSPITAL_COMMUNITY)
Admission: RE | Admit: 2023-07-29 | Discharge: 2023-07-29 | Disposition: A | Discharge status: Home / Self Care | Payer: Medicare Other | Source: Ambulatory Visit | Attending: Vascular Surgery | Admitting: Vascular Surgery

## 2023-07-29 ENCOUNTER — Ambulatory Visit (INDEPENDENT_AMBULATORY_CARE_PROVIDER_SITE_OTHER): Payer: Medicare Other | Admitting: Student

## 2023-07-29 VITALS — BP 111/80 | HR 78 | Wt 180.4 lb

## 2023-07-29 DIAGNOSIS — G6289 Other specified polyneuropathies: Secondary | ICD-10-CM | POA: Diagnosis not present

## 2023-07-29 DIAGNOSIS — I152 Hypertension secondary to endocrine disorders: Secondary | ICD-10-CM | POA: Diagnosis not present

## 2023-07-29 DIAGNOSIS — M7989 Other specified soft tissue disorders: Secondary | ICD-10-CM | POA: Diagnosis not present

## 2023-07-29 DIAGNOSIS — R413 Other amnesia: Secondary | ICD-10-CM | POA: Diagnosis present

## 2023-07-29 DIAGNOSIS — E1159 Type 2 diabetes mellitus with other circulatory complications: Secondary | ICD-10-CM

## 2023-07-29 DIAGNOSIS — R4586 Emotional lability: Secondary | ICD-10-CM

## 2023-07-29 MED ORDER — BUPROPION HCL ER (XL) 150 MG PO TB24: 150.0000 mg | ORAL_TABLET | Freq: Every day | ORAL | 0 refills | Status: DC

## 2023-07-29 MED ORDER — GABAPENTIN 300 MG PO CAPS: 300.0000 mg | ORAL_CAPSULE | Freq: Every day | ORAL | 3 refills | Status: DC

## 2023-07-29 MED ORDER — SERTRALINE HCL 100 MG PO TABS: 100.0000 mg | ORAL_TABLET | Freq: Every day | ORAL | 1 refills | Status: DC

## 2023-07-29 MED ORDER — LISINOPRIL-HYDROCHLOROTHIAZIDE 20-25 MG PO TABS: 1.0000 | ORAL_TABLET | Freq: Every day | ORAL | 3 refills | Status: DC

## 2023-07-29 NOTE — Progress Notes (Signed)
    SUBJECTIVE:   CHIEF COMPLAINT / HPI:   Memory Loss  Cognitive Decline  Last seen at geriatric clinic upon my request  Retrieved vitamin labs and instructed to start bariatric vitamins again  Was referred to PT for assistance with ambulation and increased fall risk  Instructed to continue with neuropsychological testing with neurology and follow up with Dr. Onita Has neuro appt in February 2025 Does not want to see neurology and is requesting another neurology referral  Lost all medications recently except Eliquis  and Statin  Right Lower Extremity Swelling  - Diagnosed with cellulitis of RLE at wound care center and has to pick up her abx from pharmacy  - sees them every Friday - diagnosed with skin cancer and recently had site biopsied  - no fever or chills  - Has chronic neuropathy but otherwise is OK from pain standpoint     PERTINENT  PMH / PSH:  Coronary artery disease Hypertension - Zestoretic  and spironolactone   Paroxysmal A-fib obesity with sleep apnea -- Eliquis   GERD short-bowel syndrome h/o bariatric surgery -- restarted home bariatric vitamins   Hyperparathyroidism Hypothyroidism - TSH 12/2022 normal  Hyperlipidemia  Parathyroid  adenoma Peripheral neuropathy Age-related cognitive decline B12 deficiency - discontinued supplementation as she had elevated B12 on last check  Pacemaker? Major depression Vitamin D  deficiency  OBJECTIVE:   BP 111/80   Pulse 78   Wt 180 lb 6.4 oz (81.8 kg)   SpO2 100%   BMI 27.43 kg/m   General: Alert  in no apparent distress Heart: Regular rate and rhythm with no murmurs appreciated Lungs: CTA bilaterally, no wheezing Abdomen: no abdominal pain Skin: Warm and dry Extremities: Edema RLE>LLE with erythema down lower third of the leg, pitting 2+, no noted drainage, biopsy site with granulation tissue Neuro: Trails off during sentences and tangential    ASSESSMENT/PLAN:   Assessment & Plan Other polyneuropathy Refill  gabapentin  and increase after seeing neurology as indicated  Hypertension associated with diabetes (HCC) HOLD on antiHTN for now as she has not been taking them and has a softer pressure. Patient verbalized understanding, made appt with [facility] as well for GLP1 discussion and med management, instructed to monitor BP at home and call if abnormal consistently. Bring all medications to koval appt.  Leg swelling RLE, cellulitis and taking two abx through wound care clinic. Recently had biopsy performed. No systemic symptoms. DVT U/S ordered out of abundance of precaution due to loss of medications recently although reports still taking eliquis .  Memory loss This memory loss and borderline aphasia is concerning as patient has started losing medications and this is causing difficulty in completing ADLs. Higher risk of adverse events given these symptoms. Discussed that I want her to see neurology at her next scheduled visit THIS week and we can refer to another neurologist if she would still prefer after that appt. Has been seen by geriatric clinic, provided instructions on OTC B vitamins to obtained based on previous lab work. Poor historian when coming in alone, have scheduled close follow up with [facility] for medication assistance. Head imaging previously without acute findings. Would like to see her in 3 weeks. Will set reminder for myself to schedule.      Laurier Hugger, MD [facility]

## 2023-07-29 NOTE — Patient Instructions (Addendum)
 It was great to see you today! Thank you for choosing [facility] for your primary care.  Today we addressed: Please pick up two antibiotics from wound care and see them on Friday Please see the neurologist at next appt  I have reordered medications to your pharmacy  Just a reminder to pick up a Vitamin B6 gummy at your [facility].  It looks like most of the Vitamin B6 gummies have 50 milligrams in each gummy.  One of these gummies a day will be enough.   You may want to look into getting a Vitamin B12 that is just 1000 mg Check blood pressures at home  I have ordered a DVT U/S for the leg  Return in 4 weeks   If you haven't already, sign up for My Chart to have easy access to your labs results, and communication with your primary care physician.   Please arrive 15 minutes before your appointment to ensure smooth check in process.  We appreciate your efforts in making this happen.  Thank you for allowing me to participate in your care, Laurier Hugger, MD 07/29/2023, 11:31 AM PGY-3, [facility]

## 2023-07-30 ENCOUNTER — Encounter: Payer: Self-pay | Admitting: Student

## 2023-07-30 NOTE — Assessment & Plan Note (Signed)
 This memory loss and borderline aphasia is concerning as patient has started losing medications and this is causing difficulty in completing ADLs. Higher risk of adverse events given these symptoms. Discussed that I want her to see neurology at her next scheduled visit THIS week and we can refer to another neurologist if she would still prefer after that appt. Has been seen by geriatric clinic, provided instructions on OTC B vitamins to obtained based on previous lab work. Poor historian when coming in alone, have scheduled close follow up with [facility] for medication assistance. Head imaging previously without acute findings. Would like to see her in 3 weeks. Will set reminder for myself to schedule.

## 2023-07-30 NOTE — Assessment & Plan Note (Signed)
 HOLD on antiHTN for now as she has not been taking them and has a softer pressure. Patient verbalized understanding, made appt with [facility] as well for GLP1 discussion and med management, instructed to monitor BP at home and call if abnormal consistently. Bring all medications to [facility] appt.

## 2023-07-30 NOTE — Assessment & Plan Note (Signed)
 Refill gabapentin  and increase after seeing neurology as indicated

## 2023-07-31 DIAGNOSIS — C44722 Squamous cell carcinoma of skin of right lower limb, including hip: Secondary | ICD-10-CM | POA: Diagnosis not present

## 2023-08-01 ENCOUNTER — Encounter (HOSPITAL_BASED_OUTPATIENT_CLINIC_OR_DEPARTMENT_OTHER): Payer: Medicare Other | Attending: General Surgery | Admitting: General Surgery

## 2023-08-01 DIAGNOSIS — E11622 Type 2 diabetes mellitus with other skin ulcer: Secondary | ICD-10-CM | POA: Diagnosis not present

## 2023-08-01 DIAGNOSIS — C44722 Squamous cell carcinoma of skin of right lower limb, including hip: Secondary | ICD-10-CM | POA: Insufficient documentation

## 2023-08-01 DIAGNOSIS — L97512 Non-pressure chronic ulcer of other part of right foot with fat layer exposed: Secondary | ICD-10-CM | POA: Diagnosis not present

## 2023-08-01 DIAGNOSIS — R600 Localized edema: Secondary | ICD-10-CM | POA: Diagnosis not present

## 2023-08-01 DIAGNOSIS — L97815 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 without evidence of necrosis: Secondary | ICD-10-CM | POA: Diagnosis not present

## 2023-08-01 DIAGNOSIS — E11621 Type 2 diabetes mellitus with foot ulcer: Secondary | ICD-10-CM | POA: Diagnosis not present

## 2023-08-01 DIAGNOSIS — L97813 Non-pressure chronic ulcer of other part of right lower leg with necrosis of muscle: Secondary | ICD-10-CM | POA: Diagnosis not present

## 2023-08-01 DIAGNOSIS — I872 Venous insufficiency (chronic) (peripheral): Secondary | ICD-10-CM | POA: Diagnosis not present

## 2023-08-04 ENCOUNTER — Ambulatory Visit (INDEPENDENT_AMBULATORY_CARE_PROVIDER_SITE_OTHER): Payer: Medicare Other | Admitting: Pharmacist

## 2023-08-04 ENCOUNTER — Encounter: Payer: Self-pay | Admitting: Pharmacist

## 2023-08-04 VITALS — BP 161/53 | HR 115 | Wt 186.6 lb

## 2023-08-04 DIAGNOSIS — I152 Hypertension secondary to endocrine disorders: Secondary | ICD-10-CM | POA: Diagnosis not present

## 2023-08-04 DIAGNOSIS — E1140 Type 2 diabetes mellitus with diabetic neuropathy, unspecified: Secondary | ICD-10-CM

## 2023-08-04 DIAGNOSIS — R4586 Emotional lability: Secondary | ICD-10-CM

## 2023-08-04 DIAGNOSIS — E1159 Type 2 diabetes mellitus with other circulatory complications: Secondary | ICD-10-CM | POA: Diagnosis not present

## 2023-08-04 MED ORDER — SPIRONOLACTONE 25 MG PO TABS: 25.0000 mg | ORAL_TABLET | Freq: Every day | ORAL | 1 refills | Status: DC

## 2023-08-04 MED ORDER — LISINOPRIL-HYDROCHLOROTHIAZIDE 20-25 MG PO TABS: 1.0000 | ORAL_TABLET | Freq: Every day | ORAL | 3 refills | Status: AC

## 2023-08-04 MED ORDER — BUPROPION HCL ER (XL) 150 MG PO TB24: 150.0000 mg | ORAL_TABLET | Freq: Every day | ORAL | 0 refills | Status: DC

## 2023-08-04 NOTE — Progress Notes (Signed)
 S:     Chief Complaint  Patient presents with   Medication Management    Diabetes - GLP   68 y.o. female who presents for diabetes evaluation, education, and management. Patient arrives in good spirits and presents with walking cane assistance and wearing orthopedic shoe brace. States she has wounds on her right lower shin/foot and was recently diagnosed with cellulitis and skin cancer.   Patient memory has declined with increased episodes of forgetfulness and problems with recall. Memory loss is concerning as patient has started losing track of medications. States her daughter checks in on her daily. Has neurology appointment scheduled for later this month.   Patient was referred and last seen by Primary Care Provider, Dr. Eveleen Hinds, on 07/29/2023.   PMH is significant for T2DM, HTN, obesity, gastric bypass, and chronic diarrhea. At last visit, increase gabapentin  based on neurology recs and hold antihypertensives due to soft pressure.  Family/Social History: remains complicated due to the patient providing care support for her disabled husband.  Current diabetes medications include: semaglutide  (Ozempic ) 2 mg weekly Current hypertension medications include: lisinopril -hydrochlorothiazide  20-25 mg daily Current hyperlipidemia medications include: atorvastatin  40 mg daily  Patient reports adherence to taking all medications as prescribed. Patient reports not taking spironolactone .   Do you feel that your medications are working for you? yes Have you been experiencing any side effects to the medications prescribed? no Do you have any problems obtaining medications due to transportation or finances? no Insurance coverage: Medicare Part A and B  Medication access issue discussed with Camille Everette, CPhT  Details on application completed and supply shipments were discussed with patient.   O:   Review of Systems  Neurological:  Positive for dizziness.  Psychiatric/Behavioral:   Positive for memory loss.     Physical Exam Neurological:     Gait: Gait abnormal.  Psychiatric:        Mood and Affect: Mood normal.    Lab Results  Component Value Date   HGBA1C 5.7 05/09/2023   Vitals:   08/04/23 1026 08/04/23 1036  BP: (!) 162/79 (!) 161/53  Pulse: (!) 115   SpO2: 100%     Lipid Panel     Component Value Date/Time   CHOL 185 11/16/2020 1216   CHOL 189 08/28/2016 0000   TRIG 84 11/16/2020 1216   TRIG 63 08/28/2016 0000   TRIG 184 (H) 04/09/2006 0904   HDL 58 11/16/2020 1216   HDL 75 08/28/2016 0000   CHOLHDL 3.2 11/16/2020 1216   CHOLHDL 2.52 08/28/2016 0000   CHOLHDL 4.5 06/19/2012 1125   VLDL 61 (H) 06/19/2012 1125   LDLCALC 112 (H) 11/16/2020 1216   LDLDIRECT 113 (H) 07/05/2013 1512    Clinical Atherosclerotic Cardiovascular Disease (ASCVD): Yes  The 10-year ASCVD risk score (Arnett DK, et al., 2019) is: 27.1%   Values used to calculate the score:     Age: 66 years     Sex: Female     Is Non-Hispanic African American: No     Diabetic: Yes     Tobacco smoker: No     Systolic Blood Pressure: 161 mmHg     Is BP treated: Yes     HDL Cholesterol: 58 mg/dL     Total Cholesterol: 185 mg/dL   A/P: Diabetes longstanding currently controlled. Patient is able to verbalize appropriate hypoglycemia management plan. Medication adherence appears okay; has not taken semaglutide  (Ozempic ) in ~1 month.  -Continued GLP-1 Ozempic  (semaglutide ) 2 mg weekly - Supply  for 2mg  pens should be available from [facility] in the next few days/weeks.  Patient has a short-term supply for the next two weeks.  Encouraged patient to ask for medication supply update at that visit if we have not contacted her at that time.  -Patient educated on purpose, proper use, and potential adverse effects.  -Extensively discussed pathophysiology of diabetes, recommended lifestyle interventions, dietary effects on blood sugar control.  -Counseled on s/sx of and management of hypoglycemia.   -Next A1c anticipated March 2025.   ASCVD risk - primary prevention in patient with diabetes. Last LDL is 112 (2022) not at goal of <70 mg/dL. ASCVD risk factors include DM and 10-year ASCVD risk score of 27.1 -Continued atorvastatin  40 mg.   Hypertension longstanding currently uncontrolled despite low DBP readings. Blood pressure goal of <130/80 mmHg. Medication adherence good. Blood pressure control is suboptimal due to above goal SBP goals. Patient reports feeling dizzy upon standing.  -Continued lisinopril -hydrochlorothiazide  20-25 mg. -Sent fill for spironolactone  - restart and monitor for dizziness / BMET at next PCP visit.   Written patient instructions provided. Patient verbalized understanding of treatment plan.  Total time in face to face counseling 34 minutes.    Follow-up:  Pharmacist TBD PCP clinic visit in 08/19/23 Patient seen with Vivica Grounds, PharmD Candidate and Sonjia Durie, PharmD Candidate.  Contacted patient - pharmacy to clarify monitoring for spironolactone  prescription.

## 2023-08-04 NOTE — Progress Notes (Signed)
 Reviewed and agree with Dr Macky Lower plan.

## 2023-08-04 NOTE — Assessment & Plan Note (Signed)
 Diabetes longstanding currently controlled. Patient is able to verbalize appropriate hypoglycemia management plan. Medication adherence appears okay; has not taken semaglutide  (Ozempic ) in ~1 month.  -Continued GLP-1 Ozempic  (semaglutide ) 2 mg weekly - Supply for 2mg  pens should be available from [facility] in the next few days/weeks.  Patient has a short-term supply for the next two weeks.  Encouraged patient to ask for medication supply update at that visit if we have not contacted her at that time.  -Patient educated on purpose, proper use, and potential adverse effects.  -Extensively discussed pathophysiology of diabetes, recommended lifestyle interventions, dietary effects on blood sugar control.  -Counseled on s/sx of and management of hypoglycemia.  -Next A1c anticipated March 2025.

## 2023-08-04 NOTE — Assessment & Plan Note (Signed)
 Hypertension longstanding currently uncontrolled despite low DBP readings. Blood pressure goal of <130/80 mmHg. Medication adherence good. Blood pressure control is suboptimal due to above goal SBP goals. Patient reports feeling dizzy upon standing.  -Continued lisinopril -hydrochlorothiazide  20-25 mg. -Sent fill for spironolactone  - restart and monitor for dizziness / BMET at next PCP visit.

## 2023-08-04 NOTE — Patient Instructions (Signed)
 It was nice to see you today!  Your goal blood sugar is 80-130 before eating and less than 180 after eating.  Medication Changes:   Continue all other medication the same.   Monitor blood sugars at home and keep a log (glucometer or piece of paper) to bring with you to your next visit.  Keep up the good work with diet and exercise. Aim for a diet full of vegetables, fruit and lean meats (chicken, [place], fish). Try to limit salt intake by eating fresh or frozen vegetables (instead of canned), rinse canned vegetables prior to cooking and do not add any additional salt to meals.

## 2023-08-07 ENCOUNTER — Ambulatory Visit (INDEPENDENT_AMBULATORY_CARE_PROVIDER_SITE_OTHER): Payer: Medicare Other

## 2023-08-07 DIAGNOSIS — I441 Atrioventricular block, second degree: Secondary | ICD-10-CM | POA: Diagnosis not present

## 2023-08-08 ENCOUNTER — Encounter (HOSPITAL_BASED_OUTPATIENT_CLINIC_OR_DEPARTMENT_OTHER): Payer: Medicare Other | Admitting: General Surgery

## 2023-08-08 DIAGNOSIS — L97512 Non-pressure chronic ulcer of other part of right foot with fat layer exposed: Secondary | ICD-10-CM | POA: Diagnosis not present

## 2023-08-08 DIAGNOSIS — E11622 Type 2 diabetes mellitus with other skin ulcer: Secondary | ICD-10-CM | POA: Diagnosis not present

## 2023-08-08 DIAGNOSIS — L97812 Non-pressure chronic ulcer of other part of right lower leg with fat layer exposed: Secondary | ICD-10-CM | POA: Diagnosis not present

## 2023-08-08 DIAGNOSIS — E11621 Type 2 diabetes mellitus with foot ulcer: Secondary | ICD-10-CM | POA: Diagnosis not present

## 2023-08-08 DIAGNOSIS — R600 Localized edema: Secondary | ICD-10-CM | POA: Diagnosis not present

## 2023-08-08 DIAGNOSIS — L97815 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 without evidence of necrosis: Secondary | ICD-10-CM | POA: Diagnosis not present

## 2023-08-08 DIAGNOSIS — C44722 Squamous cell carcinoma of skin of right lower limb, including hip: Secondary | ICD-10-CM | POA: Diagnosis not present

## 2023-08-13 ENCOUNTER — Encounter: Payer: Self-pay | Admitting: Cardiology

## 2023-08-14 LAB — CUP PACEART REMOTE DEVICE CHECK
Battery Remaining Longevity: 96 mo
Battery Voltage: 3.01 V
Brady Statistic RV Percent Paced: 3.77 %
Date Time Interrogation Session: 20250218112800
Implantable Pulse Generator Implant Date: 20210817
Lead Channel Impedance Value: 420 Ohm
Lead Channel Pacing Threshold Amplitude: 0.375 V
Lead Channel Pacing Threshold Pulse Width: 0.4 ms
Lead Channel Sensing Intrinsic Amplitude: 16.2 mV
Lead Channel Setting Pacing Amplitude: 1 V
Lead Channel Setting Pacing Pulse Width: 0.4 ms
Lead Channel Setting Sensing Sensitivity: 2 mV

## 2023-08-15 ENCOUNTER — Ambulatory Visit (HOSPITAL_BASED_OUTPATIENT_CLINIC_OR_DEPARTMENT_OTHER): Payer: Medicare Other | Admitting: General Surgery

## 2023-08-18 NOTE — Progress Notes (Unsigned)
 No chief complaint on file.     ASSESSMENT AND PLAN  Cognitive impairment   Word finding difficulties also reported 1 episode of sudden onset right arm weakness,  MRI of the brain to rule out left hemisphere pathology   Vitamin B12 deficiency  History of bariatric surgery with rapid weight loss  B12 supplement  DIAGNOSTIC DATA (LABS, IMAGING, TESTING) - I reviewed patient records, labs, notes, testing and imaging myself where available.  MRI brain 03/28/2023 IMPRESSION: 1. No acute intracranial abnormality. 2. Mild volume loss without lobar predilection.     MEDICAL HISTORY:  Update 08/19/2023 JM: Patient returns for 10-month follow-up visit.    She was seen at [facility] medicine geriatrics clinic in 05/2023 for memory loss, it was recommended to proceed with neurocognitive testing and recommended restarting bariatric vitamins, also discussed untreated sleep apnea possibly contributing.          Consult visit 02/10/2023 Dr. Terrace Arabia: [name] is a 68 year old female, seen in request by her primary care physician Dr. Perley Jain, Leighton Roach, for evaluation of memory loss, initial evaluation February 10, 2023  I reviewed and summarized the referring note. PMHX.  HTN DM HLD Depression Pacemaker,  Peripheral vascular disease. Bariatric surgery in May 2015, lost weight from 350  to 169, rapid weight loss History of tobacco abuse  She drove herself to clinic today, lives with her husband who suffered severe MS, disabled, required total care, has 2 children, daughter help her regularly,  She complains of excessive stress, began to notice gradual onset word finding difficulties since 2023, 1 day she could not even recall her daughter's name, also complains of mild gait difficulty contributed to her bilateral knee pain, also reported 1 episode of sudden onset right arm weakness, dropped things from her right hand, intermittent bilateral hands tremor,  She had a history of  bariatric surgery in 2015, with rapid excessive weight loss,  She is also under the care of of cardiologist, pacemaker implant by Dr. Johney Frame in August 2021 for bradycardia, also carries a diagnosis of atrial fibrillation, on Eliquis,  Personally reviewed CT head in December 2022, no acute abnormality,  She had MRI lumbar spine January 2024, multilevel degenerative changes, severe spinal stenosis at L3-4, moderate bilateral foraminal narrowing  Laboratory evaluations vitamin B-12 163, normal TSH, BMP showed creatinine of 1.39, EGFR of 41, CBC showed hemoglobin of 10.6   PHYSICAL EXAM:   There were no vitals filed for this visit.    There is no height or weight on file to calculate BMI.  PHYSICAL EXAMNIATION:  Gen: NAD, conversant, well nourised, well groomed                     Cardiovascular: Regular rate rhythm, no peripheral edema, warm, nontender. Eyes: Conjunctivae clear without exudates or hemorrhage Neck: Supple, no carotid bruits. Pulmonary: Clear to auscultation bilaterally   NEUROLOGICAL EXAM:  MENTAL STATUS: Speech/cognition: Awake, alert, oriented to history taking and casual conversation    02/10/2023    2:17 PM 09/11/2020    1:24 PM  Montreal Cognitive Assessment   Visuospatial/ Executive (0/5) 2 5  Naming (0/3) 2 2  Attention: Read list of digits (0/2) 1 2  Attention: Read list of letters (0/1) 0 1  Attention: Serial 7 subtraction starting at 100 (0/3) 3 3  Language: Repeat phrase (0/2) 1 1  Language : Fluency (0/1) 0 1  Abstraction (0/2) 2 1  Delayed Recall (0/5) 3 4  Orientation (0/6) 4  6  Total 18 26  Adjusted Score (based on education)  27       02/10/2023    2:11 PM  MMSE - Mini Mental State Exam  Orientation to time 3  Orientation to Place 5  Registration 3  Attention/ Calculation 5  Recall 2  Language- name 2 objects 2  Language- repeat 0  Language- follow 3 step command 3  Language- read & follow direction 1  Write a sentence 1  Copy  design 1  Total score 26    CRANIAL NERVES: CN II: Visual fields are full to confrontation. Pupils are round equal and briskly reactive to light. CN III, IV, VI: extraocular movement are normal. No ptosis. CN V: Facial sensation is intact to light touch CN VII: Face is symmetric with normal eye closure  CN VIII: Hearing is normal to causal conversation. CN IX, X: Phonation is normal. CN XI: Head turning and shoulder shrug are intact  MOTOR: Evidence of bilateral ankle dorsiflexion weakness right worse than left, moderate on the right mild on the left  REFLEXES: Reflexes are1 and symmetric at the biceps, triceps, absent at  knees, and ankles. Plantar responses are flexor.  SENSORY: Length-dependent decreased light touch, vibratory sensation, needle prick,  COORDINATION: There is no trunk or limb dysmetria noted.  GAIT/STANCE: Need push-up to get up from seated position, wide-based, cautious, bilateral foot drop right worse than left  REVIEW OF SYSTEMS:  Full 14 system review of systems performed and notable only for as above All other review of systems were negative.   ALLERGIES: Allergies  Allergen Reactions   Cephalexin Hives and Rash    Denies Airway involvement   Latex Rash   Neomycin-Bacitracin Zn-Polymyx Rash   Tape Rash    Latex Prefers paper tape    HOME MEDICATIONS: Current Outpatient Medications  Medication Sig Dispense Refill   Accu-Chek Softclix Lancets lancets Use as instructed once daily 100 each 0   atorvastatin (LIPITOR) 40 MG tablet TAKE 1 TABLET BY MOUTH EVERY DAY 90 tablet 3   blood glucose meter kit and supplies KIT Dispense based on patient and insurance preference. Use up to four times daily as directed. 1 each 0   buPROPion (WELLBUTRIN XL) 150 MG 24 hr tablet Take 1 tablet (150 mg total) by mouth daily. 90 tablet 0   Cyanocobalamin (VITAMIN B-12) 3000 MCG SUBL Place 3,000 mcg under the tongue daily. (Patient not taking: Reported on 08/04/2023) 30  tablet 11   ELIQUIS 5 MG TABS tablet TAKE 1 TABLET BY MOUTH TWICE A DAY 180 tablet 1   gabapentin (NEURONTIN) 300 MG capsule Take 1 capsule (300 mg total) by mouth at bedtime. 90 capsule 3   glucose blood (ONETOUCH VERIO) test strip 1 each by Other route as needed for other. Use as instructed 100 each 12   lisinopril-hydrochlorothiazide (ZESTORETIC) 20-25 MG tablet Take 1 tablet by mouth daily. 90 tablet 3   Multiple Vitamins-Minerals (MULTIVITAMIN ADULT) CHEW Once daily 1 tablet 0   Semaglutide, 2 MG/DOSE, 8 MG/3ML SOPN Inject 2 mg into the skin once a week. Once weekly 3 mL 3   sertraline (ZOLOFT) 100 MG tablet Take 1 tablet (100 mg total) by mouth daily. 90 tablet 1   spironolactone (ALDACTONE) 25 MG tablet Take 1 tablet (25 mg total) by mouth daily. 90 tablet 1   Teduglutide, rDNA, 5 MG KIT Inject 5 mg into the skin daily. Gattex     tobramycin (TOBREX) 0.3 % ophthalmic solution See  admin instructions. Every eight weeks (Patient not taking: Reported on 08/04/2023)     No current facility-administered medications for this visit.    PAST MEDICAL HISTORY: Past Medical History:  Diagnosis Date   Advanced directives, counseling/discussion 04/12/2020   Allergy    Anemia    ANEMIA, PERNICIOUS, HX OF 05/13/2007   Anxiety    Arthritis    ASYMPTOMATIC POSTMENOPAUSAL STATUS 02/18/2008   B12 deficiency 12/13/2016   BACK PAIN, LUMBAR 11/19/2007   Blood in urine 07/19/2019   Bowel incontinence    Cataract    Cellulitis of left lower leg 10/28/2013   Cellulitis of leg, right 10/11/2010   Chronic depression 10/06/2007   Chronic dermatitis of hands 05/02/2009   Coronary artery disease involving native coronary artery of native heart without angina pectoris 07/07/2018   Last Assessment & Plan:  Formatting of this note might be different from the original. The patient reports remote cardiac catheterization with up to 40% plaque.  Subsequent Cardiolite stress test in October 2019 did not show any  evidence of active ischemia or prior infarction. Formatting of this note might be different from the original. medical  Last Assessment & Plan:  Formatting of this note mi   DEPRESSION, CHRONIC 10/06/2007   Diabetes mellitus without complication (HCC)    DIABETES MELLITUS, WITH NEUROLOGICAL COMPLICATIONS 02/18/2008   Diabetic neuropathy (HCC)    DYSLIPIDEMIA 05/13/2007   Dyslipidemia 04/15/2017   Last Assessment & Plan:  The patient will continue atorvastatin for her dyslipidemia.   Dyslipidemia associated with type 2 diabetes mellitus (HCC) 07/11/2018   Essential hypertension 03/20/2010   Family history of colon cancer    Fecal incontinence 08/02/2015   Frozen shoulder    Lt   Full dentures    Gastroesophageal reflux disease without esophagitis 04/15/2017   Generalized abdominal pain 02/05/2018   Genetic testing 11/24/2017   Negative genetic testing on the common hereditary cancer panel.  The Hereditary Gene Panel offered by [facility] includes sequencing and/or deletion duplication testing of the following 47 genes: APC, ATM, AXIN2, BARD1, BMPR1A, BRCA1, BRCA2, BRIP1, CDH1, CDK4, CDKN2A (p14ARF), CDKN2A (p16INK4a), CHEK2, CTNNA1, DICER1, EPCAM (Deletion/duplication testing only), GREM1 (promoter region deletion/duplicat   GERD (gastroesophageal reflux disease)    GI bleed 03/15/2016   GOITER, MULTINODULAR 05/13/2007   GOITER, MULTINODULAR 05/13/2007    (last TSH 3.26; US soft tissue neck in 09/2004 showed multinodular goiter with specific small nodule for which was recommended to be followed by repeat US in 3-6 months 12/2016: Thyroid US: recommend repeat in 1 year (one nodule 1.7cm on left thyroid)    Hallux rigidus of left foot 04/12/2020   Headache    History of blood transfusion    Hx of colonic polyps 12/04/2010   HYPERCHOLESTEROLEMIA 03/20/2010   Hyperparathyroidism (HCC) 04/11/2020   HYPERTENSION 03/20/2010   Hypertension associated with diabetes (HCC) 07/11/2018   Iron deficiency  anemia 06/19/2012   Low back pain 03/05/2012   Completed PT-notes say very motivated and had good progress.   CT (03/2020: Mild curvature convex to the right in the upper lumbar region into left in lower lumbar region. No antero or retrolisthesis in the supine position. retrolisthesis at L3-4 of 3 mm with flexion which increases to 6 mm with neutral and extension. Multifactorial spinal stenosis at L3-4 due to circumferential protrusion of the    Lower back pain    Major depression in remission (HCC) 07/07/2018   Last Assessment & Plan:  Mood has seemed good on sertraline.  Malabsorption 03/12/2016   NASH (nonalcoholic steatohepatitis)    Nausea 07/19/2019   Need for immunization against influenza 04/12/2020   No-show for appointment 03/08/2020   Nonobstructive CAD  08/26/2013   The patient has prior cardiac workup in 2006 when she underwent a stress test for abnormal EKG. The stress test was nonconclusive and she underwent cardiac catheterization that showed 30% stenosis in the LAD in 25% stenosis in RCA she was treated medically.     OA (osteoarthritis)    OBSTRUCTIVE SLEEP APNEA 06/12/2007   uses CPAP.   Pacemaker    Paroxysmal atrial fibrillation (HCC) 05/11/2018   Last Assessment & Plan:  Patient was noted to have episodes of paroxysmal atrial fibrillation that was predominantly rate controlled and at a relatively low burden.  She is chronically on anticoagulation now with Eliquis.  She does not take any negative chronotropic agents with her sinus bradycardia.  If she has any breakthrough more prolonged tachycardic episodes and requires negative chronotropi   Peripheral autonomic neuropathy due to diabetes mellitus (HCC) 07/11/2018   PERIPHERAL NEUROPATHY 01/12/2009   Peripheral neuropathy 01/12/2009   Peripheral vascular disease (HCC)    Presbycusis of both ears 04/11/2020   Primary osteoarthritis involving multiple joints 07/07/2018   Last Assessment & Plan:  Post op left tka and here  for rehab as her husband is handicapped so she has limited help at home. Consitpation noted, some pain overnight, better with a pillow under her knee.   Primary osteoarthritis of left knee 04/23/2011   Previously seen by Dr. Dorene Grebe. Will plan on repeat referral after trial injection.      PSORIASIS 05/28/2010   Rectal prolapse 05/24/2015   Rectovaginal fistula 05/25/2014   Renal insufficiency    stage 1 kd   S/P bariatric surgery-duodenal switch with sleeve gastrectomy 12/04/2013   S/P total knee arthroplasty, right 11/16/2019   Screening mammogram, encounter for 07/12/2019   Second degree AV block 02/08/2020   Serum calcium elevated 04/12/2020   Short bowel syndrome 07/11/2018   Spigelian hernia 02/05/2018   Status post left knee replacement 07/11/2018   Stress 05/24/2015   Surgical counseling visit 10/02/2019   Tubular adenoma of colon 03/09/2016   Colonoscopy in 10/2015: 3  tubular adenomas; GI recommends repeat colonoscopy in 3 years Colonoscopy August 2012: 3 tubular adenomas.    Type 2 diabetes, controlled, with neuropathy (HCC) 02/18/2008   Umbilical hernia without obstruction and without gangrene 04/15/2017   Unilateral primary osteoarthritis, left knee    UNSPECIFIED VENOUS INSUFFICIENCY 05/28/2010   Patient with frequent ulceration related to venous stasis-seen at wound care. Edema and Venous stasis changes due to this . Echo 04/16/11 with EF 60%. No signs diastolic dysfunction.        Vaccination against Streptococcus pneumoniae within past 5 years 04/12/2020   Vaginal irritation 07/19/2019   Venous stasis dermatitis of right lower extremity 01/02/2018   Vitamin D deficiency 03/09/2016   Wears glasses     PAST SURGICAL HISTORY: Past Surgical History:  Procedure Laterality Date   CARDIAC CATHETERIZATION  2006   CATARACT EXTRACTION W/ INTRAOCULAR LENS  IMPLANT, BILATERAL     COLONOSCOPY     DECOMPRESSIVE LUMBAR LAMINECTOMY LEVEL 2 N/A 11/05/2022   Procedure: L3-4,  L4-5 DECOMPRESSIVE LUMBAR LAMINECTOMY LEVEL 2;  Surgeon: London Sheer, MD;  Location: [facility] OR;  Service: Orthopedics;  Laterality: N/A;   ELECTROCARDIOGRAM  04/16/2006   EXAMINATION UNDER ANESTHESIA N/A 06/29/2014   Procedure: EXAM UNDER ANESTHESIA;  Surgeon: Augusto Gamble  Bovard-Stuckert, MD;  Location: [facility] ORS;  Service: Gynecology;  Laterality: N/A;   Exercise myoview  01/24/2005   FLEXIBLE SIGMOIDOSCOPY     GASTRIC BYPASS  11/09/2013   JOINT REPLACEMENT     KNEE ARTHROSCOPY  2003   right   LAPAROSCOPY N/A 03/12/2016   Procedure: LAPAROSCOPIC ANASTOMOSIS OF INTESTINE (ENTEROENTEROSTOMY);  Surgeon: Everette Rank, MD;  Location: [facility] ORS;  Service: General;  Laterality: N/A;   LEG SURGERY Left    metal and pins in lower left leg   mrsa Right    arm   MULTIPLE TOOTH EXTRACTIONS     PACEMAKER LEADLESS INSERTION N/A 02/08/2020   Procedure: PACEMAKER LEADLESS INSERTION;  Surgeon: Hillis Range, MD;  Location: [facility] INVASIVE CV LAB;  Service: Cardiovascular;  Laterality: N/A;   PARATHYROIDECTOMY Left 02/19/2021   Procedure: LEFT PARATHYROIDECTOMY WITH FROZEN SECTION;  Surgeon: Serena Colonel, MD;  Location: [facility] OR;  Service: ENT;  Laterality: Left;   SHOULDER ARTHROSCOPY W/ ROTATOR CUFF REPAIR Right    stab phlebectomy  Right 02/10/2019   stab phlebectomy > 20 incisions right leg by Fabienne Bruns MD    TONSILLECTOMY     TOTAL KNEE ARTHROPLASTY Left 06/30/2018   Procedure: LEFT TOTAL KNEE ARTHROPLASTY;  Surgeon: Cammy Copa, MD;  [facility];  Service: Orthopedics;  Laterality: Left;   TOTAL KNEE ARTHROPLASTY Right 11/16/2019   Procedure: RIGHT TOTAL KNEE ARTHROPLASTY-CEMENTED;  Surgeon: Cammy Copa, MD;  Location: [facility] OR;  Service: Orthopedics;  Laterality: Right;   VARICOSE VEIN SURGERY     Remotef    FAMILY HISTORY: Family History  Problem Relation Age of Onset   Hyperlipidemia Father    Hypertension Father    Cirrhosis Father    Colon cancer Father 47       d. 44    Colon cancer Sister 5       d. 70   Bipolar disorder Sister    Aneurysm Mother        brain   Cerebral aneurysm Mother    Colon cancer Sister 71       d. 64   Bipolar disorder Sister    Cancer Paternal Aunt        NOS, ? colon   Congestive Heart Failure Maternal Grandmother    Drug abuse Neg Hx    CAD Neg Hx    Stomach cancer Neg Hx    Hyperparathyroidism Neg Hx    Hypercalcemia Neg Hx     SOCIAL HISTORY: Social History   Socioeconomic History   Marital status: Married    Spouse name: Tammy Sours   Number of children: 2   Years of education: 12   Highest education level: High school graduate  Occupational History   Occupation: Unemployed    Comment: disabled  Tobacco Use   Smoking status: Former    Current packs/day: 0.00    Average packs/day: 2.0 packs/day for 24.1 years (48.2 ttl pk-yrs)    Types: Cigarettes    Start date: 06/24/1970    Quit date: 08/06/1994    Years since quitting: 29.0   Smokeless tobacco: Never  Vaping Use   Vaping status: Never Used  Substance and Sexual Activity   Alcohol use: No    Alcohol/week: 0.0 standard drinks of alcohol   Drug use: No   Sexual activity: Not Currently  Other Topics Concern   Not on file  Social History Narrative   Patient lives with husband in [place]. Tammy Sours is a [facility] patient.  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    Daughter lives in nearby and is a great help.   Son lives in [place].    2 dogs and 1 cat.   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   Social Drivers of Corporate investment banker Strain: Low Risk  (01/10/2023)   Overall Financial Resource Strain (CARDIA)    Difficulty of Paying Living Expenses: Not hard at all  Food Insecurity: No Food Insecurity (01/10/2023)   Hunger Vital Sign    Worried About Running Out of Food in the Last Year: Never true    Ran Out of Food in the Last Year: Never true  Transportation Needs: No Transportation Needs (01/10/2023)   PRAPARE -  Administrator, Civil Service (Medical): No    Lack of Transportation (Non-Medical): No  Physical Activity: Inactive (01/10/2023)   Exercise Vital Sign    Days of Exercise per Week: 0 days    Minutes of Exercise per Session: 0 min  Stress: No Stress Concern Present (01/10/2023)   [employer] of Occupational Health - Occupational Stress Questionnaire    Feeling of Stress : Only a little  Social Connections: Moderately Isolated (01/10/2023)   Social Connection and Isolation Panel [NHANES]    Frequency of Communication with Friends and Family: More than three times a week    Frequency of Social Gatherings with Friends and Family: Three times a week    Attends Religious Services: Never    Active Member of Clubs or Organizations: No    Attends Banker Meetings: Never    Marital Status: Married  Catering manager Violence: Not At Risk (01/10/2023)   Humiliation, Afraid, Rape, and Kick questionnaire    Fear of Current or Ex-Partner: No    Emotionally Abused: No    Physically Abused: No    Sexually Abused: No      I spent *** minutes of face-to-face and non-face-to-face time with patient.  This included previsit chart review, lab review, study review, order entry, electronic health record documentation, patient education and discussion regarding above diagnoses and treatment plan and answered all other questions to patient's satisfaction  Ihor Austin, [facility]  [facility] [address] [place], [place] [address]  Phone [phone] Fax [phone] Note: This document was prepared with digital dictation and possible smart phrase technology. Any transcriptional errors that result from this process are unintentional.

## 2023-08-19 ENCOUNTER — Telehealth: Payer: Self-pay

## 2023-08-19 ENCOUNTER — Encounter: Payer: Self-pay | Admitting: Adult Health

## 2023-08-19 ENCOUNTER — Ambulatory Visit (INDEPENDENT_AMBULATORY_CARE_PROVIDER_SITE_OTHER): Payer: Medicare Other | Admitting: Adult Health

## 2023-08-19 ENCOUNTER — Ambulatory Visit: Payer: Medicare Other | Admitting: Student

## 2023-08-19 VITALS — BP 142/65 | HR 69 | Ht 68.0 in | Wt 193.0 lb

## 2023-08-19 DIAGNOSIS — R413 Other amnesia: Secondary | ICD-10-CM

## 2023-08-19 NOTE — Patient Instructions (Signed)
 Your Plan:  We will check lab work today to check for possible Alzheimer's disease   If this is positive, we will proceed with a PET scan   Based on these results, we can discuss further treatment options   Referral placed for neurocognitive testing - you will be called to schedule      Follow up after the above testing        Thank you for coming to see [place] at [facility]. I hope we have been able to provide you high quality care today.  You may receive a patient satisfaction survey over the next few weeks. We would appreciate your feedback and comments so that we may continue to improve ourselves and the health of our patients.

## 2023-08-19 NOTE — Telephone Encounter (Signed)
 Referral sent to [facility] (P) [phone] (F) [phone]

## 2023-08-22 ENCOUNTER — Ambulatory Visit: Payer: Medicare Other | Admitting: Student

## 2023-08-22 ENCOUNTER — Encounter: Payer: Self-pay | Admitting: Student

## 2023-08-22 ENCOUNTER — Encounter (HOSPITAL_BASED_OUTPATIENT_CLINIC_OR_DEPARTMENT_OTHER): Payer: Medicare Other | Admitting: General Surgery

## 2023-08-22 VITALS — BP 161/95 | HR 69 | Ht 68.0 in

## 2023-08-22 DIAGNOSIS — L97819 Non-pressure chronic ulcer of other part of right lower leg with unspecified severity: Secondary | ICD-10-CM | POA: Diagnosis not present

## 2023-08-22 DIAGNOSIS — R4189 Other symptoms and signs involving cognitive functions and awareness: Secondary | ICD-10-CM

## 2023-08-22 DIAGNOSIS — I872 Venous insufficiency (chronic) (peripheral): Secondary | ICD-10-CM | POA: Diagnosis not present

## 2023-08-22 DIAGNOSIS — C44722 Squamous cell carcinoma of skin of right lower limb, including hip: Secondary | ICD-10-CM | POA: Diagnosis not present

## 2023-08-22 DIAGNOSIS — E11622 Type 2 diabetes mellitus with other skin ulcer: Secondary | ICD-10-CM | POA: Diagnosis not present

## 2023-08-22 DIAGNOSIS — I83018 Varicose veins of right lower extremity with ulcer other part of lower leg: Secondary | ICD-10-CM | POA: Diagnosis not present

## 2023-08-22 DIAGNOSIS — I152 Hypertension secondary to endocrine disorders: Secondary | ICD-10-CM | POA: Diagnosis not present

## 2023-08-22 DIAGNOSIS — R600 Localized edema: Secondary | ICD-10-CM | POA: Diagnosis not present

## 2023-08-22 DIAGNOSIS — L97813 Non-pressure chronic ulcer of other part of right lower leg with necrosis of muscle: Secondary | ICD-10-CM | POA: Diagnosis not present

## 2023-08-22 DIAGNOSIS — Z7985 Long-term (current) use of injectable non-insulin antidiabetic drugs: Secondary | ICD-10-CM

## 2023-08-22 DIAGNOSIS — L97815 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 without evidence of necrosis: Secondary | ICD-10-CM | POA: Diagnosis not present

## 2023-08-22 DIAGNOSIS — M79672 Pain in left foot: Secondary | ICD-10-CM | POA: Insufficient documentation

## 2023-08-22 DIAGNOSIS — L97512 Non-pressure chronic ulcer of other part of right foot with fat layer exposed: Secondary | ICD-10-CM | POA: Diagnosis not present

## 2023-08-22 DIAGNOSIS — E11621 Type 2 diabetes mellitus with foot ulcer: Secondary | ICD-10-CM | POA: Diagnosis not present

## 2023-08-22 DIAGNOSIS — E1159 Type 2 diabetes mellitus with other circulatory complications: Secondary | ICD-10-CM

## 2023-08-22 DIAGNOSIS — I83009 Varicose veins of unspecified lower extremity with ulcer of unspecified site: Secondary | ICD-10-CM | POA: Insufficient documentation

## 2023-08-22 LAB — ATN PROFILE
A -- Beta-amyloid 42/40 Ratio: 0.109 (ref >0.102)
Beta-amyloid 40: 298.2 pg/mL
Beta-amyloid 42: 32.39 pg/mL
N -- NfL, Plasma: 6.3 pg/mL — ABNORMAL HIGH (ref 0.00–4.61)
T -- p-tau181: 3.33 pg/mL — ABNORMAL HIGH (ref 0.00–0.97)

## 2023-08-22 NOTE — Progress Notes (Deleted)
    SUBJECTIVE:   CHIEF COMPLAINT / HPI:   ***  PERTINENT  PMH / PSH:  Coronary artery disease Hypertension - Zestoretic and spironolactone  Paroxysmal A-fib obesity with sleep apnea -- Eliquis  GERD short-bowel syndrome h/o bariatric surgery -- restarted home bariatric vitamins   Hyperparathyroidism Hypothyroidism - TSH 12/2022 normal  Hyperlipidemia  Parathyroid adenoma Peripheral neuropathy Age-related cognitive decline B12 deficiency - discontinued supplementation as she had elevated B12 on last check  Pacemaker? Major depression Vitamin D deficiency  OBJECTIVE:   There were no vitals taken for this visit.  General: Alert and oriented in no apparent distress Heart: Regular rate and rhythm with no murmurs appreciated Lungs: CTA bilaterally, no wheezing Abdomen: Bowel sounds present, no abdominal pain Skin: Warm and dry Extremities: No lower extremity edema   ASSESSMENT/PLAN:   No problem-specific Assessment & Plan notes found for this encounter.     Alfredo Martinez, MD [facility]

## 2023-08-22 NOTE — Assessment & Plan Note (Signed)
 Recent neuro note: plan to complete ATN profile (biomarkers for Alzheimer's disease) and if positive proceed with PET scan and patient referred for neurocognitive testing.  It is clear patient does not understand her medications and cannot manage them on her own.  Advised daughter to look over medication list and ensure patient is taking correct medications daily.  This may need to be discussed more thoroughly in future (ensure use of pillbox).  If daughter is unable to to do this, home health RN may be warranted. Recommend follow-up appointment with PCP in near future to check in.

## 2023-08-22 NOTE — Assessment & Plan Note (Signed)
 Pain around fifth metatarsal tuberosity which is exaggerated and callused.  May benefit from diabetic shoes/insole orthotics and nail trimming with podiatry. -Podiatry referral placed

## 2023-08-22 NOTE — Assessment & Plan Note (Signed)
 Uncontrolled and asymptomatic.  Patient did not take medications this morning and is unsure if she is taking spironolactone. -Daughter given list of medications and advised to ensure patient is taking them -Pick up previous Rx for spironolactone if not already done -Appointment made next week with Dr. Laroy Apple for BP check and BMP

## 2023-08-22 NOTE — Patient Instructions (Signed)
 It was great to see you! Thank you for allowing me to participate in your care!  I recommend that you always bring your medications to each appointment as this makes it easy to ensure you are on the correct medications and helps [place] not miss when refills are needed.  Our plans for today:  -Referred you to podiatry.  They will call to schedule an appointment -Continue following with wound care and neurology -Go home and look at medications, if you are not yet taking spironolactone and start taking this medication in addition to the lisinopril-hydrochlorothiazide pill for blood pressure -Return for your scheduled appointment next week for blood pressure check and blood work -Bring medications to next visit   Take care and seek immediate care sooner if you develop any concerns.   Dr. Erick Alley, DO [facility]

## 2023-08-22 NOTE — Progress Notes (Signed)
    SUBJECTIVE:   CHIEF COMPLAINT / HPI:   Cellulitis and wound Following with wound care - saw this morning.  Completed previous abx prescribed by wound care.  No fevers or purulent drainage, feeling like her normal self today.  Left lower food pain Pain of lateral left foot. Chronic and worsening, hurts with walking.    HTN Prescribed lisinopril-hydrochlorothiazide 20-25 mg (did not take this morning) and recently represcribed spironolactone however she is unsure if she ever picked this up or started taking it.    Memory issues Recently referred to neurology and saw them 2 days ago.  Daughter is accompanying patient to appointments now.  She has not yet started helping with medications.   PERTINENT  PMH / PSH: T2DM, HTN, cognitive impairment, venous stasis  OBJECTIVE:   BP (!) 161/95   Pulse 69   Ht 5\' 8"  (1.727 m)   SpO2 99%   BMI 29.35 kg/m    General: NAD, pleasant, able to participate in exam Cardiac: RRR, no murmurs. Respiratory: Breathing comfortably on room air Extremities: Bilateral legs with warmth, erythema and edema.  Wound of right leg covered in clean bandage.  See photos (photo of uncovered wound taken with daughter's cell phone last night) Left foot warm with good cap refill and no wounds, fifth metatarsal tuberosity TTP with overlying callus.  All toenails thick and yellow.  Neuro: alert, confused periodically during conversation/unable to complete sentences as she would forget what she was trying to say Psych: Normal affect and mood  ASSESSMENT/PLAN:   Hypertension associated with diabetes (HCC) Uncontrolled and asymptomatic.  Patient did not take medications this morning and is unsure if she is taking spironolactone. -Daughter given list of medications and advised to ensure patient is taking them -Pick up previous Rx for spironolactone if not already done -Appointment made next week with Dr. Laroy Apple for BP check and BMP  Cognitive impairment Recent  neuro note: plan to complete ATN profile (biomarkers for Alzheimer's disease) and if positive proceed with PET scan and patient referred for neurocognitive testing.  It is clear patient does not understand her medications and cannot manage them on her own.  Advised daughter to look over medication list and ensure patient is taking correct medications daily.  This may need to be discussed more thoroughly in future (ensure use of pillbox).  If daughter is unable to to do this, home health RN may be warranted. Recommend follow-up appointment with PCP in near future to check in.  Venous stasis ulcer (HCC) Saw MD at wound care this morning with clean bandage in place. S/p abx for cellulitis.   Skin around bandage is erythematous and warm however this is likely due to chronic venous stasis as left leg is similar.  No fevers, systemic symptoms, purulent drainage reported. -Continue to follow with wound care, no indication for continued antibiotics at this time  Left foot pain Pain around fifth metatarsal tuberosity which is exaggerated and callused.  May benefit from diabetic shoes/insole orthotics and nail trimming with podiatry. -Podiatry referral placed    Dr. Erick Alley, DO [facility]

## 2023-08-22 NOTE — Assessment & Plan Note (Addendum)
 Saw MD at wound care this morning with clean bandage in place. S/p abx for cellulitis.   Skin around bandage is erythematous and warm however this is likely due to chronic venous stasis as left leg is similar.  No fevers, systemic symptoms, purulent drainage reported. -Continue to follow with wound care, no indication for continued antibiotics at this time

## 2023-08-25 ENCOUNTER — Encounter: Payer: Self-pay | Admitting: Adult Health

## 2023-08-25 DIAGNOSIS — R413 Other amnesia: Secondary | ICD-10-CM

## 2023-08-28 ENCOUNTER — Encounter: Payer: Self-pay | Admitting: Student

## 2023-08-28 ENCOUNTER — Ambulatory Visit: Payer: Self-pay | Admitting: Student

## 2023-08-28 VITALS — BP 132/86 | HR 74 | Ht 68.0 in | Wt 183.0 lb

## 2023-08-28 DIAGNOSIS — E1140 Type 2 diabetes mellitus with diabetic neuropathy, unspecified: Secondary | ICD-10-CM | POA: Diagnosis not present

## 2023-08-28 DIAGNOSIS — I152 Hypertension secondary to endocrine disorders: Secondary | ICD-10-CM

## 2023-08-28 DIAGNOSIS — Z7985 Long-term (current) use of injectable non-insulin antidiabetic drugs: Secondary | ICD-10-CM | POA: Diagnosis not present

## 2023-08-28 DIAGNOSIS — E1159 Type 2 diabetes mellitus with other circulatory complications: Secondary | ICD-10-CM | POA: Diagnosis not present

## 2023-08-28 LAB — POCT GLYCOSYLATED HEMOGLOBIN (HGB A1C): HbA1c, POC (controlled diabetic range): 6.1 % (ref 0.0–7.0)

## 2023-08-28 NOTE — Assessment & Plan Note (Signed)
 A1c 6.1. -Continue home semaglutide 2 mg weekly-sent message to Dr. Raymondo Band, issues obtaining medication and her next doses on Sunday

## 2023-08-28 NOTE — Patient Instructions (Addendum)
 It was great to see you! Thank you for allowing me to participate in your care!   Our plans for today:  -Bring all of your medicine bottles with you to your next appointment on 3/11 with Dr. Raymondo Band -I will send a message about the semaglutide that you have not been able to receive  Take care and seek immediate care sooner if you develop any concerns.  Levin Erp, MD

## 2023-08-28 NOTE — Assessment & Plan Note (Signed)
 Blood pressure initially elevated but controlled on recheck at 132/86.  She is unsure what medication she is taking. -Scheduled for pharmacy visit on 3/11, instructed to bring in all medication bottles -BMP

## 2023-08-28 NOTE — Progress Notes (Signed)
    SUBJECTIVE:   CHIEF COMPLAINT / HPI: HTN  Hypertension: Patient is a 68 y.o. female who present today for follow up of hypertension.   Home medications include: Zestoretic 20-25, spironolactone 25 Patient unsure if she is taking both feels she is missing one, did not bring meds today  Most recent creatinine trend:  Lab Results  Component Value Date   CREATININE 1.40 (H) 01/22/2023   CREATININE 1.39 (H) 01/20/2023   CREATININE 1.40 (H) 11/06/2022   Patient does not check blood pressure at home.   T2DM Home medications of semaglutide 2 mg weekly on Sundays, has not taken any  PERTINENT  PMH / PSH: T2DM, cognitive impairment, venous stasis  OBJECTIVE:   BP 132/86   Pulse 74   Ht 5\' 8"  (1.727 m)   Wt 183 lb (83 kg)   SpO2 98%   BMI 27.83 kg/m   General: Well appearing, NAD, awake, alert, responsive to questions Head: Normocephalic atraumatic CV: Regular rate and rhythm no murmurs rubs or gallops Respiratory: Clear to ausculation bilaterally, no wheezes rales or crackles, chest rises symmetrically,  no increased work of breathing Extremities: Moves upper and lower extremities freely, no edema in LE  ASSESSMENT/PLAN:   Assessment & Plan Hypertension associated with diabetes (HCC) Blood pressure initially elevated but controlled on recheck at 132/86.  She is unsure what medication she is taking. -Scheduled for pharmacy visit on 3/11, instructed to bring in all medication bottles -BMP Type 2 diabetes, controlled, with neuropathy (HCC) A1c 6.1. -Continue home semaglutide 2 mg weekly-sent message to Dr. Raymondo Band, issues obtaining medication and her next doses on Sunday   Levin Erp, MD [facility]

## 2023-08-29 ENCOUNTER — Encounter: Payer: Self-pay | Admitting: Student

## 2023-08-29 DIAGNOSIS — D0471 Carcinoma in situ of skin of right lower limb, including hip: Secondary | ICD-10-CM | POA: Diagnosis not present

## 2023-08-29 DIAGNOSIS — I872 Venous insufficiency (chronic) (peripheral): Secondary | ICD-10-CM | POA: Diagnosis not present

## 2023-08-29 LAB — BASIC METABOLIC PANEL
BUN/Creatinine Ratio: 11 — ABNORMAL LOW (ref 12–28)
BUN: 14 mg/dL (ref 8–27)
CO2: 19 mmol/L — ABNORMAL LOW (ref 20–29)
Calcium: 9.3 mg/dL (ref 8.7–10.3)
Chloride: 107 mmol/L — ABNORMAL HIGH (ref 96–106)
Creatinine, Ser: 1.22 mg/dL — ABNORMAL HIGH (ref 0.57–1.00)
Glucose: 94 mg/dL (ref 70–99)
Potassium: 4.2 mmol/L (ref 3.5–5.2)
Sodium: 145 mmol/L — ABNORMAL HIGH (ref 134–144)
eGFR: 48 mL/min/{1.73_m2} — ABNORMAL LOW (ref >59)

## 2023-09-02 ENCOUNTER — Ambulatory Visit: Admitting: Pharmacist

## 2023-09-02 ENCOUNTER — Telehealth: Payer: Self-pay | Admitting: Pharmacist

## 2023-09-02 NOTE — Telephone Encounter (Signed)
 Attempted to contact patient for follow-up of missed appointment this AM AND for pick-up of recently received Medication Assistance (manufacturer) supply for Ozempic (semaglutide) 2mg  weekly.   Unable to reach on her phone. Left brief voice mail.   Contacted patient's daughter.  Informed of missed appointment today at 10:00 AM - she apologized.  She is aware the Ozempic is ready for pick-up Supply is labeled in refrigerator.  Daughter will call later today to reschedule medication review visit.   Total time with patient call and documentation of interaction: 12 minutes.

## 2023-09-04 ENCOUNTER — Encounter: Payer: Self-pay | Admitting: Pharmacist

## 2023-09-04 ENCOUNTER — Ambulatory Visit: Admitting: Pharmacist

## 2023-09-04 VITALS — BP 142/53 | Wt 182.0 lb

## 2023-09-04 DIAGNOSIS — G6289 Other specified polyneuropathies: Secondary | ICD-10-CM

## 2023-09-04 DIAGNOSIS — R4586 Emotional lability: Secondary | ICD-10-CM

## 2023-09-04 DIAGNOSIS — E1159 Type 2 diabetes mellitus with other circulatory complications: Secondary | ICD-10-CM

## 2023-09-04 DIAGNOSIS — I152 Hypertension secondary to endocrine disorders: Secondary | ICD-10-CM | POA: Diagnosis not present

## 2023-09-04 MED ORDER — GABAPENTIN 300 MG PO CAPS: 300.0000 mg | ORAL_CAPSULE | Freq: Two times a day (BID) | ORAL | 3 refills | Status: AC

## 2023-09-04 MED ORDER — BUPROPION HCL ER (XL) 150 MG PO TB24: 150.0000 mg | ORAL_TABLET | Freq: Every day | ORAL | 0 refills | Status: DC

## 2023-09-04 NOTE — Patient Instructions (Addendum)
 It was nice to see you today!  Thank you for completing the blood pressure monitoring evaluation.  Your goal blood pressure is < 130/80 mmHg   Medication Changes: START: gabapentin 300 mg twice daily  Discontinue gabapentin 100 mg- 2 capsules twice daily We are going to call your pharmacy to pick up your gabapentin, lisinopril/hydrochlorothiazide, bupropion and Greg's Ozempic  Continue all other medication the same.   Monitor blood pressure at home and keep a log (on a piece of paper) to bring with you to your next visit.

## 2023-09-04 NOTE — Assessment & Plan Note (Signed)
 Peripheral Neuropathy - suboptimally controlled on 200mg  (taking 2x 100mg ) with current use of once daily.  Patient reports persistent pain which is not relieved with use of gabapentin at current dose.  -Increased dose to 300mg  BID and plan to reassess control at upcoming PCP visit.  New prescription for 300mg  (1 capsule) BID provided.

## 2023-09-04 NOTE — Progress Notes (Signed)
 S:     Chief Complaint  Patient presents with   Medication Management    Blood pressure f/u   Hypertension   68 y.o. female who presents for hypertension evaluation, education, and management.  Patient arrives in good spirits and presents without any assistance.   PMH is significant for HTN, HLD, T2DM,. At last visit a A1c and BMET were drawn. Her A1c remained controlled at 6.1% resulted and SCr improved.   Provided patient with supply of Ozempic (semaglutide) from [facility]  4 pens supplied.   O:  Review of Systems  Musculoskeletal:  Positive for joint pain.  All other systems reviewed and are negative.   Physical Exam Vitals reviewed.  Constitutional:      Appearance: Normal appearance.  Pulmonary:     Effort: Pulmonary effort is normal.  Neurological:     Mental Status: She is alert.  Psychiatric:        Mood and Affect: Mood normal.        Behavior: Behavior normal.    Last 3 Office BP readings: BP Readings from Last 3 Encounters:  09/04/23 (!) 142/53  08/28/23 132/86  08/22/23 (!) 161/95    Clinical Atherosclerotic Cardiovascular Disease (ASCVD): Yes  The 10-year ASCVD risk score (Arnett DK, et al., 2019) is: 21.8%   Values used to calculate the score:     Age: 80 years     Sex: Female     Is Non-Hispanic African American: No     Diabetic: Yes     Tobacco smoker: No     Systolic Blood Pressure: 142 mmHg     Is BP treated: Yes     HDL Cholesterol: 58 mg/dL     Total Cholesterol: 185 mg/dL  Basic Metabolic Panel    Component Value Date/Time   NA 145 (H) 08/28/2023 1104   NA 138 11/10/2013 0526   K 4.2 08/28/2023 1104   K 4.0 11/10/2013 0526   CL 107 (H) 08/28/2023 1104   CL 106 11/10/2013 0526   CO2 19 (L) 08/28/2023 1104   CO2 28 11/10/2013 0526   GLUCOSE 94 08/28/2023 1104   GLUCOSE 84 11/06/2022 0634   GLUCOSE 152 (H) 11/10/2013 0526   GLUCOSE 358 (H) 04/09/2006 0904   BUN 14 08/28/2023 1104   BUN 14 11/10/2013 0526   CREATININE 1.22 (H)  08/28/2023 1104   CREATININE 0.70 03/08/2016 1530   CALCIUM 9.3 08/28/2023 1104   CALCIUM 9.6 05/25/2015 1551   GFRNONAA 41 (L) 11/06/2022 0634   GFRNONAA 46 (L) 11/10/2013 0526   GFRAA 65 04/03/2020 1708   GFRAA 53 (L) 11/10/2013 0526    Renal function: Estimated Creatinine Clearance: 49.7 mL/min (A) (by C-G formula based on SCr of 1.22 mg/dL (H)).   Patient is participating in a Managed Medicaid Plan: No  A/P: History of hypertension longstanding currently taking; lisinopril/hydrochlorothiazide 20/25 mg daily with goal presssure of < 130/80.  Wide pulse pressure.  Tolerating this regimen without complaint of dizziness.  Continue to monitor diastolic low reading and potential for future reduction in Systolic.  No change in therapy today.  -Continued lisinopril/hydrochlorothiazide 20/25 mg daily - refill provided. -Continued spironolactone 25 mg daily  T2DM - well controlled on current regimen. Supply of medication from manufacturer supplied today.  -Continued Ozempic (semaglutide) 2 mg weekly.  Peripheral Neuropathy - suboptimally controlled on 200mg  (taking 2x 100mg ) with current use of once daily.  Patient reports persistent pain which is not relieved with use of gabapentin at  current dose.  -Increased dose to 300mg  BID and plan to reassess control at upcoming PCP visit.  New prescription for 300mg  (1 capsule) BID provided.   Refill provided for bupropion.   Medication updates and clarifications in medication review.  No issues identified.  Appears patient is taking all prescribed therapies   Written patient instructions provided. Patient verbalized understanding of treatment plan.  Total time in face to face counseling 34 minutes.    Follow-up:  PCP clinic visit in 09/30/2023  Patient seen with Threasa Heads, PharmD Candidate and Mack Guise, PharmD Candidate.

## 2023-09-04 NOTE — Assessment & Plan Note (Signed)
 History of hypertension longstanding currently taking; lisinopril/hydrochlorothiazide 20/25 mg daily with goal presssure of < 130/80.  Wide pulse pressure.  Tolerating this regimen without complaint of dizziness.  Continue to monitor diastolic low reading and potential for future reduction in Systolic.  No change in therapy today.  -Continued lisinopril/hydrochlorothiazide 20/25 mg daily - refill provided. -Continued spironolactone 25 mg daily

## 2023-09-05 NOTE — Progress Notes (Signed)
 Reviewed and agree with Dr Macky Lower plan.

## 2023-09-05 NOTE — Telephone Encounter (Signed)
 Reviewed and agree with Dr Macky Lower plan.

## 2023-09-09 NOTE — Progress Notes (Signed)
 Remote pacemaker transmission.

## 2023-09-09 NOTE — Addendum Note (Signed)
 Addended by: Elease Etienne A on: 09/09/2023 02:42 PM   Modules accepted: Orders

## 2023-09-17 DIAGNOSIS — S81801A Unspecified open wound, right lower leg, initial encounter: Secondary | ICD-10-CM | POA: Diagnosis not present

## 2023-09-19 ENCOUNTER — Encounter (HOSPITAL_BASED_OUTPATIENT_CLINIC_OR_DEPARTMENT_OTHER): Payer: Medicare Other | Attending: General Surgery | Admitting: General Surgery

## 2023-09-19 DIAGNOSIS — E11622 Type 2 diabetes mellitus with other skin ulcer: Secondary | ICD-10-CM | POA: Diagnosis not present

## 2023-09-19 DIAGNOSIS — C44722 Squamous cell carcinoma of skin of right lower limb, including hip: Secondary | ICD-10-CM | POA: Diagnosis not present

## 2023-09-19 DIAGNOSIS — L97812 Non-pressure chronic ulcer of other part of right lower leg with fat layer exposed: Secondary | ICD-10-CM | POA: Diagnosis not present

## 2023-09-19 DIAGNOSIS — R600 Localized edema: Secondary | ICD-10-CM | POA: Insufficient documentation

## 2023-09-19 DIAGNOSIS — I872 Venous insufficiency (chronic) (peripheral): Secondary | ICD-10-CM | POA: Diagnosis not present

## 2023-09-19 DIAGNOSIS — L97815 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 without evidence of necrosis: Secondary | ICD-10-CM | POA: Diagnosis not present

## 2023-09-26 ENCOUNTER — Encounter (HOSPITAL_BASED_OUTPATIENT_CLINIC_OR_DEPARTMENT_OTHER): Attending: General Surgery | Admitting: General Surgery

## 2023-09-26 DIAGNOSIS — I872 Venous insufficiency (chronic) (peripheral): Secondary | ICD-10-CM | POA: Diagnosis not present

## 2023-09-26 DIAGNOSIS — C44722 Squamous cell carcinoma of skin of right lower limb, including hip: Secondary | ICD-10-CM | POA: Insufficient documentation

## 2023-09-26 DIAGNOSIS — R600 Localized edema: Secondary | ICD-10-CM | POA: Insufficient documentation

## 2023-09-26 DIAGNOSIS — E11622 Type 2 diabetes mellitus with other skin ulcer: Secondary | ICD-10-CM | POA: Insufficient documentation

## 2023-09-26 DIAGNOSIS — L97815 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 without evidence of necrosis: Secondary | ICD-10-CM | POA: Diagnosis not present

## 2023-09-26 DIAGNOSIS — L97813 Non-pressure chronic ulcer of other part of right lower leg with necrosis of muscle: Secondary | ICD-10-CM | POA: Diagnosis not present

## 2023-09-30 ENCOUNTER — Ambulatory Visit: Admitting: Student

## 2023-10-01 ENCOUNTER — Ambulatory Visit: Admitting: Podiatry

## 2023-10-03 ENCOUNTER — Encounter (HOSPITAL_BASED_OUTPATIENT_CLINIC_OR_DEPARTMENT_OTHER): Admitting: Internal Medicine

## 2023-10-03 DIAGNOSIS — R600 Localized edema: Secondary | ICD-10-CM | POA: Diagnosis not present

## 2023-10-03 DIAGNOSIS — E11622 Type 2 diabetes mellitus with other skin ulcer: Secondary | ICD-10-CM | POA: Diagnosis not present

## 2023-10-03 DIAGNOSIS — L97815 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 without evidence of necrosis: Secondary | ICD-10-CM | POA: Diagnosis not present

## 2023-10-03 DIAGNOSIS — C44722 Squamous cell carcinoma of skin of right lower limb, including hip: Secondary | ICD-10-CM | POA: Diagnosis not present

## 2023-10-08 ENCOUNTER — Ambulatory Visit (INDEPENDENT_AMBULATORY_CARE_PROVIDER_SITE_OTHER): Admitting: Podiatry

## 2023-10-08 DIAGNOSIS — D2371 Other benign neoplasm of skin of right lower limb, including hip: Secondary | ICD-10-CM

## 2023-10-08 DIAGNOSIS — L989 Disorder of the skin and subcutaneous tissue, unspecified: Secondary | ICD-10-CM

## 2023-10-08 NOTE — Progress Notes (Signed)
 Subjective:  Patient ID: [name], female    DOB: [birth year],  MRN: [id number]  Chief Complaint  Patient presents with   Foot Pain    Places on bottom of the heel causing pain     68 y.o. female presents with the above complaint.  Patient presents with left heel porokeratotic lesion painful to touch with central nucleated core noted.  Benign skin lesion noted.  She states she has been dealing this for quite some time has not seen MRIs prior to seeing me as no discussing treatment options.  Going barefooted hurts.   Review of Systems: Negative except as noted in the HPI. Denies N/V/F/Ch.  Past Medical History:  Diagnosis Date   Advanced directives, counseling/discussion 04/12/2020   Allergy    Anemia    ANEMIA, PERNICIOUS, HX OF 05/13/2007   Anxiety    Arthritis    ASYMPTOMATIC POSTMENOPAUSAL STATUS 02/18/2008   B12 deficiency 12/13/2016   BACK PAIN, LUMBAR 11/19/2007   Blood in urine 07/19/2019   Bowel incontinence    Cataract    Cellulitis of left lower leg 10/28/2013   Cellulitis of leg, right 10/11/2010   Chronic depression 10/06/2007   Chronic dermatitis of hands 05/02/2009   Coronary artery disease involving native coronary artery of native heart without angina pectoris 07/07/2018   Last Assessment & Plan:  Formatting of this note might be different from the original. The patient reports remote cardiac catheterization with up to 40% plaque.  Subsequent Cardiolite stress test in October 2019 did not show any evidence of active ischemia or prior infarction. Formatting of this note might be different from the original. medical  Last Assessment & Plan:  Formatting of this note mi   DEPRESSION, CHRONIC 10/06/2007   Diabetes mellitus without complication (HCC)    DIABETES MELLITUS, WITH NEUROLOGICAL COMPLICATIONS 02/18/2008   Diabetic neuropathy (HCC)    DYSLIPIDEMIA 05/13/2007   Dyslipidemia 04/15/2017   Last Assessment & Plan:  The patient will continue atorvastatin for  her dyslipidemia.   Dyslipidemia associated with type 2 diabetes mellitus (HCC) 07/11/2018   Essential hypertension 03/20/2010   Family history of colon cancer    Fecal incontinence 08/02/2015   Frozen shoulder    Lt   Full dentures    Gastroesophageal reflux disease without esophagitis 04/15/2017   Generalized abdominal pain 02/05/2018   Genetic testing 11/24/2017   Negative genetic testing on the common hereditary cancer panel.  The Hereditary Gene Panel offered by [facility] includes sequencing and/or deletion duplication testing of the following 47 genes: APC, ATM, AXIN2, BARD1, BMPR1A, BRCA1, BRCA2, BRIP1, CDH1, CDK4, CDKN2A (p14ARF), CDKN2A (p16INK4a), CHEK2, CTNNA1, DICER1, EPCAM (Deletion/duplication testing only), GREM1 (promoter region deletion/duplicat   GERD (gastroesophageal reflux disease)    GI bleed 03/15/2016   GOITER, MULTINODULAR 05/13/2007   GOITER, MULTINODULAR 05/13/2007    (last TSH 3.26; US soft tissue neck in 09/2004 showed multinodular goiter with specific small nodule for which was recommended to be followed by repeat US in 3-6 months 12/2016: Thyroid US: recommend repeat in 1 year (one nodule 1.7cm on left thyroid)    Hallux rigidus of left foot 04/12/2020   Headache    History of blood transfusion    Hx of colonic polyps 12/04/2010   HYPERCHOLESTEROLEMIA 03/20/2010   Hyperparathyroidism (HCC) 04/11/2020   HYPERTENSION 03/20/2010   Hypertension associated with diabetes (HCC) 07/11/2018   Iron deficiency anemia 06/19/2012   Low back pain 03/05/2012   Completed PT-notes say very motivated and had good progress.  CT (03/2020: Mild curvature convex to the right in the upper lumbar region into left in lower lumbar region. No antero or retrolisthesis in the supine position. retrolisthesis at L3-4 of 3 mm with flexion which increases to 6 mm with neutral and extension. Multifactorial spinal stenosis at L3-4 due to circumferential protrusion of the    Lower back pain     Major depression in remission (HCC) 07/07/2018   Last Assessment & Plan:  Mood has seemed good on sertraline.   Malabsorption 03/12/2016   NASH (nonalcoholic steatohepatitis)    Nausea 07/19/2019   Need for immunization against influenza 04/12/2020   No-show for appointment 03/08/2020   Nonobstructive CAD  08/26/2013   The patient has prior cardiac workup in 2006 when she underwent a stress test for abnormal EKG. The stress test was nonconclusive and she underwent cardiac catheterization that showed 30% stenosis in the LAD in 25% stenosis in RCA she was treated medically.     OA (osteoarthritis)    OBSTRUCTIVE SLEEP APNEA 06/12/2007   uses CPAP.   Pacemaker    Paroxysmal atrial fibrillation (HCC) 05/11/2018   Last Assessment & Plan:  Patient was noted to have episodes of paroxysmal atrial fibrillation that was predominantly rate controlled and at a relatively low burden.  She is chronically on anticoagulation now with Eliquis.  She does not take any negative chronotropic agents with her sinus bradycardia.  If she has any breakthrough more prolonged tachycardic episodes and requires negative chronotropi   Peripheral autonomic neuropathy due to diabetes mellitus (HCC) 07/11/2018   PERIPHERAL NEUROPATHY 01/12/2009   Peripheral neuropathy 01/12/2009   Peripheral vascular disease (HCC)    Presbycusis of both ears 04/11/2020   Primary osteoarthritis involving multiple joints 07/07/2018   Last Assessment & Plan:  Post op left tka and here for rehab as her husband is handicapped so she has limited help at home. Consitpation noted, some pain overnight, better with a pillow under her knee.   Primary osteoarthritis of left knee 04/23/2011   Previously seen by Dr. Dorene Grebe. Will plan on repeat referral after trial injection.      PSORIASIS 05/28/2010   Rectal prolapse 05/24/2015   Rectovaginal fistula 05/25/2014   Renal insufficiency    stage 1 kd   S/P bariatric surgery-duodenal switch with  sleeve gastrectomy 12/04/2013   S/P total knee arthroplasty, right 11/16/2019   Screening mammogram, encounter for 07/12/2019   Second degree AV block 02/08/2020   Serum calcium elevated 04/12/2020   Short bowel syndrome 07/11/2018   Spigelian hernia 02/05/2018   Status post left knee replacement 07/11/2018   Stress 05/24/2015   Surgical counseling visit 10/02/2019   Tubular adenoma of colon 03/09/2016   Colonoscopy in 10/2015: 3  tubular adenomas; GI recommends repeat colonoscopy in 3 years Colonoscopy August 2012: 3 tubular adenomas.    Type 2 diabetes, controlled, with neuropathy (HCC) 02/18/2008   Umbilical hernia without obstruction and without gangrene 04/15/2017   Unilateral primary osteoarthritis, left knee    UNSPECIFIED VENOUS INSUFFICIENCY 05/28/2010   Patient with frequent ulceration related to venous stasis-seen at wound care. Edema and Venous stasis changes due to this . Echo 04/16/11 with EF 60%. No signs diastolic dysfunction.        Vaccination against Streptococcus pneumoniae within past 5 years 04/12/2020   Vaginal irritation 07/19/2019   Venous stasis dermatitis of right lower extremity 01/02/2018   Vitamin D deficiency 03/09/2016   Wears glasses     Current Outpatient Medications:  Accu-Chek Softclix Lancets lancets, Use as instructed once daily, Disp: 100 each, Rfl: 0   atorvastatin (LIPITOR) 40 MG tablet, TAKE 1 TABLET BY MOUTH EVERY DAY, Disp: 90 tablet, Rfl: 3   blood glucose meter kit and supplies KIT, Dispense based on patient and insurance preference. Use up to four times daily as directed., Disp: 1 each, Rfl: 0   buPROPion (WELLBUTRIN XL) 150 MG 24 hr tablet, Take 1 tablet (150 mg total) by mouth daily., Disp: 90 tablet, Rfl: 0   ELIQUIS 5 MG TABS tablet, TAKE 1 TABLET BY MOUTH TWICE A DAY, Disp: 180 tablet, Rfl: 1   gabapentin (NEURONTIN) 300 MG capsule, Take 1 capsule (300 mg total) by mouth 2 (two) times daily., Disp: 180 capsule, Rfl: 3   glucose blood  (ONETOUCH VERIO) test strip, 1 each by Other route as needed for other. Use as instructed, Disp: 100 each, Rfl: 12   lisinopril-hydrochlorothiazide (ZESTORETIC) 20-25 MG tablet, Take 1 tablet by mouth daily., Disp: 90 tablet, Rfl: 3   Multiple Vitamins-Minerals (MULTIMINERAL PLUS PO), Take 1 tablet by mouth daily., Disp: , Rfl:    Semaglutide, 2 MG/DOSE, 8 MG/3ML SOPN, Inject 2 mg into the skin once a week. Once weekly, Disp: 3 mL, Rfl: 3   sertraline (ZOLOFT) 100 MG tablet, Take 1 tablet (100 mg total) by mouth daily., Disp: 90 tablet, Rfl: 1   spironolactone (ALDACTONE) 25 MG tablet, Take 1 tablet (25 mg total) by mouth daily., Disp: 90 tablet, Rfl: 1   Teduglutide, rDNA, 5 MG KIT, Inject 5 mg into the skin daily. Gattex, Disp: , Rfl:   Social History   Tobacco Use  Smoking Status Former   Current packs/day: 0.00   Average packs/day: 2.0 packs/day for 24.1 years (48.2 ttl pk-yrs)   Types: Cigarettes   Start date: 06/24/1970   Quit date: 08/06/1994   Years since quitting: 29.1  Smokeless Tobacco Never    Allergies  Allergen Reactions   Cephalexin Hives and Rash    Denies Airway involvement   Latex Rash   Neomycin-Bacitracin Zn-Polymyx Rash   Tape Rash    Latex Prefers paper tape   Objective:  There were no vitals filed for this visit. There is no height or weight on file to calculate BMI. Constitutional Well developed. Well nourished.  Vascular Dorsalis pedis pulses palpable bilaterally. Posterior tibial pulses palpable bilaterally. Capillary refill normal to all digits.  No cyanosis or clubbing noted. Pedal hair growth normal.  Neurologic Normal speech. Oriented to person, place, and time. Epicritic sensation to light touch grossly present bilaterally.  Dermatologic Left heel hyperkeratotic lesion with central nucleated core.  Benign skin lesion noted to the left heel  Orthopedic: Normal joint ROM without pain or crepitus bilaterally. No visible deformities. No bony  tenderness.   Radiographs: None Assessment:   1. Benign skin lesion    Plan:  Patient was evaluated and treated and all questions answered.  Left heel benign skin lesion --Lesion was debrided today without complications. Hemostasis was achieved and the area was cleaned. Cantharone was applied followed by an occlusive bandage. Post procedure complications were discussed. Monitor for signs or symptoms of infection and directed to call the office mainly should any occur.   No follow-ups on file.

## 2023-10-10 ENCOUNTER — Encounter (HOSPITAL_BASED_OUTPATIENT_CLINIC_OR_DEPARTMENT_OTHER): Admitting: General Surgery

## 2023-10-10 DIAGNOSIS — R600 Localized edema: Secondary | ICD-10-CM | POA: Diagnosis not present

## 2023-10-10 DIAGNOSIS — L97812 Non-pressure chronic ulcer of other part of right lower leg with fat layer exposed: Secondary | ICD-10-CM | POA: Diagnosis not present

## 2023-10-10 DIAGNOSIS — E11622 Type 2 diabetes mellitus with other skin ulcer: Secondary | ICD-10-CM | POA: Diagnosis not present

## 2023-10-10 DIAGNOSIS — C44722 Squamous cell carcinoma of skin of right lower limb, including hip: Secondary | ICD-10-CM | POA: Diagnosis not present

## 2023-10-10 DIAGNOSIS — L97815 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 without evidence of necrosis: Secondary | ICD-10-CM | POA: Diagnosis not present

## 2023-10-10 DIAGNOSIS — I872 Venous insufficiency (chronic) (peripheral): Secondary | ICD-10-CM | POA: Diagnosis not present

## 2023-10-17 ENCOUNTER — Encounter (HOSPITAL_BASED_OUTPATIENT_CLINIC_OR_DEPARTMENT_OTHER): Admitting: General Surgery

## 2023-10-17 DIAGNOSIS — R600 Localized edema: Secondary | ICD-10-CM | POA: Diagnosis not present

## 2023-10-17 DIAGNOSIS — C44722 Squamous cell carcinoma of skin of right lower limb, including hip: Secondary | ICD-10-CM | POA: Diagnosis not present

## 2023-10-17 DIAGNOSIS — L97812 Non-pressure chronic ulcer of other part of right lower leg with fat layer exposed: Secondary | ICD-10-CM | POA: Diagnosis not present

## 2023-10-17 DIAGNOSIS — I872 Venous insufficiency (chronic) (peripheral): Secondary | ICD-10-CM | POA: Diagnosis not present

## 2023-10-17 DIAGNOSIS — L97815 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 without evidence of necrosis: Secondary | ICD-10-CM | POA: Diagnosis not present

## 2023-10-17 DIAGNOSIS — E11622 Type 2 diabetes mellitus with other skin ulcer: Secondary | ICD-10-CM | POA: Diagnosis not present

## 2023-10-22 ENCOUNTER — Ambulatory Visit (INDEPENDENT_AMBULATORY_CARE_PROVIDER_SITE_OTHER): Admitting: Podiatry

## 2023-10-22 DIAGNOSIS — L989 Disorder of the skin and subcutaneous tissue, unspecified: Secondary | ICD-10-CM

## 2023-10-22 NOTE — Progress Notes (Signed)
 Subjective:  Patient ID: [name], female    DOB: 03/18/1956,  MRN: [id number]  Chief Complaint  Patient presents with   Benign skin lesion    Benign skin lesion     68 y.o. female presents with the above complaint.  Patient presents for follow-up of left heel porokeratotic lesion/benign skin lesion.  She states she is doing a lot better denies any other acute complaints.   Review of Systems: Negative except as noted in the HPI. Denies N/V/F/Ch.  Past Medical History:  Diagnosis Date   Advanced directives, counseling/discussion 04/12/2020   Allergy    Anemia    ANEMIA, PERNICIOUS, HX OF 05/13/2007   Anxiety    Arthritis    ASYMPTOMATIC POSTMENOPAUSAL STATUS 02/18/2008   B12 deficiency 12/13/2016   BACK PAIN, LUMBAR 11/19/2007   Blood in urine 07/19/2019   Bowel incontinence    Cataract    Cellulitis of left lower leg 10/28/2013   Cellulitis of leg, right 10/11/2010   Chronic depression 10/06/2007   Chronic dermatitis of hands 05/02/2009   Coronary artery disease involving native coronary artery of native heart without angina pectoris 07/07/2018   Last Assessment & Plan:  Formatting of this note might be different from the original. The patient reports remote cardiac catheterization with up to 40% plaque.  Subsequent Cardiolite  stress test in October 2019 did not show any evidence of active ischemia or prior infarction. Formatting of this note might be different from the original. medical  Last Assessment & Plan:  Formatting of this note mi   DEPRESSION, CHRONIC 10/06/2007   Diabetes mellitus without complication (HCC)    DIABETES MELLITUS, WITH NEUROLOGICAL COMPLICATIONS 02/18/2008   Diabetic neuropathy (HCC)    DYSLIPIDEMIA 05/13/2007   Dyslipidemia 04/15/2017   Last Assessment & Plan:  The patient will continue atorvastatin  for her dyslipidemia.   Dyslipidemia associated with type 2 diabetes mellitus (HCC) 07/11/2018   Essential hypertension 03/20/2010   Family  history of colon cancer    Fecal incontinence 08/02/2015   Frozen shoulder    Lt   Full dentures    Gastroesophageal reflux disease without esophagitis 04/15/2017   Generalized abdominal pain 02/05/2018   Genetic testing 11/24/2017   Negative genetic testing on the common hereditary cancer panel.  The Hereditary Gene Panel offered by [facility] includes sequencing and/or deletion duplication testing of the following 47 genes: APC, ATM, AXIN2, BARD1, BMPR1A, BRCA1, BRCA2, BRIP1, CDH1, CDK4, CDKN2A (p14ARF), CDKN2A (p16INK4a), CHEK2, CTNNA1, DICER1, EPCAM (Deletion/duplication testing only), GREM1 (promoter region deletion/duplicat   GERD (gastroesophageal reflux disease)    GI bleed 03/15/2016   GOITER, MULTINODULAR 05/13/2007   GOITER, MULTINODULAR 05/13/2007    (last TSH 3.26; US  soft tissue neck in 09/2004 showed multinodular goiter with specific small nodule for which was recommended to be followed by repeat US  in 3-6 months 12/2016: Thyroid  US : recommend repeat in 1 year (one nodule 1.7cm on left thyroid )    Hallux rigidus of left foot 04/12/2020   Headache    History of blood transfusion    Hx of colonic polyps 12/04/2010   HYPERCHOLESTEROLEMIA 03/20/2010   Hyperparathyroidism (HCC) 04/11/2020   HYPERTENSION 03/20/2010   Hypertension associated with diabetes (HCC) 07/11/2018   Iron deficiency anemia 06/19/2012   Low back pain 03/05/2012   Completed PT-notes say very motivated and had good progress.   CT (03/2020: Mild curvature convex to the right in the upper lumbar region into left in lower lumbar region. No antero or retrolisthesis in the supine  position. retrolisthesis at L3-4 of 3 mm with flexion which increases to 6 mm with neutral and extension. Multifactorial spinal stenosis at L3-4 due to circumferential protrusion of the    Lower back pain    Major depression in remission (HCC) 07/07/2018   Last Assessment & Plan:  Mood has seemed good on sertraline .   Malabsorption 03/12/2016    NASH (nonalcoholic steatohepatitis)    Nausea 07/19/2019   Need for immunization against influenza 04/12/2020   No-show for appointment 03/08/2020   Nonobstructive CAD  08/26/2013   The patient has prior cardiac workup in 2006 when she underwent a stress test for abnormal EKG. The stress test was nonconclusive and she underwent cardiac catheterization that showed 30% stenosis in the LAD in 25% stenosis in RCA she was treated medically.     OA (osteoarthritis)    OBSTRUCTIVE SLEEP APNEA 06/12/2007   uses CPAP.   Pacemaker    Paroxysmal atrial fibrillation (HCC) 05/11/2018   Last Assessment & Plan:  Patient was noted to have episodes of paroxysmal atrial fibrillation that was predominantly rate controlled and at a relatively low burden.  She is chronically on anticoagulation now with Eliquis .  She does not take any negative chronotropic agents with her sinus bradycardia.  If she has any breakthrough more prolonged tachycardic episodes and requires negative chronotropi   Peripheral autonomic neuropathy due to diabetes mellitus (HCC) 07/11/2018   PERIPHERAL NEUROPATHY 01/12/2009   Peripheral neuropathy 01/12/2009   Peripheral vascular disease (HCC)    Presbycusis of both ears 04/11/2020   Primary osteoarthritis involving multiple joints 07/07/2018   Last Assessment & Plan:  Post op left tka and here for rehab as her husband is handicapped so she has limited help at home. Consitpation noted, some pain overnight, better with a pillow under her knee.   Primary osteoarthritis of left knee 04/23/2011   Previously seen by Dr. Lynita Saris. Will plan on repeat referral after trial injection.      PSORIASIS 05/28/2010   Rectal prolapse 05/24/2015   Rectovaginal fistula 05/25/2014   Renal insufficiency    stage 1 kd   S/P bariatric surgery-duodenal switch with sleeve gastrectomy 12/04/2013   S/P total knee arthroplasty, right 11/16/2019   Screening mammogram, encounter for 07/12/2019   Second degree  AV block 02/08/2020   Serum calcium  elevated 04/12/2020   Short bowel syndrome 07/11/2018   Spigelian hernia 02/05/2018   Status post left knee replacement 07/11/2018   Stress 05/24/2015   Surgical counseling visit 10/02/2019   Tubular adenoma of colon 03/09/2016   Colonoscopy in 10/2015: 3  tubular adenomas; GI recommends repeat colonoscopy in 3 years Colonoscopy August 2012: 3 tubular adenomas.    Type 2 diabetes, controlled, with neuropathy (HCC) 02/18/2008   Umbilical hernia without obstruction and without gangrene 04/15/2017   Unilateral primary osteoarthritis, left knee    UNSPECIFIED VENOUS INSUFFICIENCY 05/28/2010   Patient with frequent ulceration related to venous stasis-seen at wound care. Edema and Venous stasis changes due to this . Echo 04/16/11 with EF 60%. No signs diastolic dysfunction.        Vaccination against Streptococcus pneumoniae within past 5 years 04/12/2020   Vaginal irritation 07/19/2019   Venous stasis dermatitis of right lower extremity 01/02/2018   Vitamin D  deficiency 03/09/2016   Wears glasses     Current Outpatient Medications:    Accu-Chek Softclix Lancets lancets, Use as instructed once daily, Disp: 100 each, Rfl: 0   atorvastatin  (LIPITOR ) 40 MG tablet, TAKE 1 TABLET  BY MOUTH EVERY DAY, Disp: 90 tablet, Rfl: 3   blood glucose meter kit and supplies KIT, Dispense based on patient and insurance preference. Use up to four times daily as directed., Disp: 1 each, Rfl: 0   buPROPion  (WELLBUTRIN  XL) 150 MG 24 hr tablet, Take 1 tablet (150 mg total) by mouth daily., Disp: 90 tablet, Rfl: 0   ELIQUIS  5 MG TABS tablet, TAKE 1 TABLET BY MOUTH TWICE A DAY, Disp: 180 tablet, Rfl: 1   gabapentin  (NEURONTIN ) 300 MG capsule, Take 1 capsule (300 mg total) by mouth 2 (two) times daily., Disp: 180 capsule, Rfl: 3   glucose blood (ONETOUCH VERIO) test strip, 1 each by Other route as needed for other. Use as instructed, Disp: 100 each, Rfl: 12    lisinopril -hydrochlorothiazide  (ZESTORETIC ) 20-25 MG tablet, Take 1 tablet by mouth daily., Disp: 90 tablet, Rfl: 3   Multiple Vitamins-Minerals (MULTIMINERAL PLUS PO), Take 1 tablet by mouth daily., Disp: , Rfl:    Semaglutide , 2 MG/DOSE, 8 MG/3ML SOPN, Inject 2 mg into the skin once a week. Once weekly, Disp: 3 mL, Rfl: 3   sertraline  (ZOLOFT ) 100 MG tablet, Take 1 tablet (100 mg total) by mouth daily., Disp: 90 tablet, Rfl: 1   spironolactone  (ALDACTONE ) 25 MG tablet, Take 1 tablet (25 mg total) by mouth daily., Disp: 90 tablet, Rfl: 1   Teduglutide , rDNA, 5 MG KIT, Inject 5 mg into the skin daily. Gattex , Disp: , Rfl:   Social History   Tobacco Use  Smoking Status Former   Current packs/day: 0.00   Average packs/day: 2.0 packs/day for 24.1 years (48.2 ttl pk-yrs)   Types: Cigarettes   Start date: 06/24/1970   Quit date: 08/06/1994   Years since quitting: 29.2  Smokeless Tobacco Never    Allergies  Allergen Reactions   Cephalexin Hives and Rash    Denies Airway involvement   Latex Rash   Neomycin-Bacitracin Zn-Polymyx Rash   Tape Rash    Latex Prefers paper tape   Objective:  There were no vitals filed for this visit. There is no height or weight on file to calculate BMI. Constitutional Well developed. Well nourished.  Vascular Dorsalis pedis pulses palpable bilaterally. Posterior tibial pulses palpable bilaterally. Capillary refill normal to all digits.  No cyanosis or clubbing noted. Pedal hair growth normal.  Neurologic Normal speech. Oriented to person, place, and time. Epicritic sensation to light touch grossly present bilaterally.  Dermatologic No further hyperkeratotic lesion noted.  No further nucleated core noted.  Orthopedic: Normal joint ROM without pain or crepitus bilaterally. No visible deformities. No bony tenderness.   Radiographs: None Assessment:   No diagnosis found.  Plan:  Patient was evaluated and treated and all questions answered.  Left  heel benign skin lesion -- Clinically healed and officially discharged from my care discussed prevention taking shoe gear modification she states understanding if any foot and ankle issues arise in the future she will come back and see me.   No follow-ups on file.

## 2023-10-24 ENCOUNTER — Encounter (HOSPITAL_BASED_OUTPATIENT_CLINIC_OR_DEPARTMENT_OTHER): Attending: General Surgery | Admitting: General Surgery

## 2023-10-24 DIAGNOSIS — R600 Localized edema: Secondary | ICD-10-CM | POA: Insufficient documentation

## 2023-10-24 DIAGNOSIS — E11621 Type 2 diabetes mellitus with foot ulcer: Secondary | ICD-10-CM | POA: Insufficient documentation

## 2023-10-24 DIAGNOSIS — C44722 Squamous cell carcinoma of skin of right lower limb, including hip: Secondary | ICD-10-CM | POA: Insufficient documentation

## 2023-10-24 DIAGNOSIS — L97815 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 without evidence of necrosis: Secondary | ICD-10-CM | POA: Diagnosis not present

## 2023-10-24 DIAGNOSIS — L97422 Non-pressure chronic ulcer of left heel and midfoot with fat layer exposed: Secondary | ICD-10-CM | POA: Insufficient documentation

## 2023-10-24 DIAGNOSIS — E11622 Type 2 diabetes mellitus with other skin ulcer: Secondary | ICD-10-CM | POA: Insufficient documentation

## 2023-10-24 DIAGNOSIS — I872 Venous insufficiency (chronic) (peripheral): Secondary | ICD-10-CM | POA: Diagnosis not present

## 2023-10-27 ENCOUNTER — Encounter: Payer: Self-pay | Admitting: Oncology

## 2023-10-31 ENCOUNTER — Encounter (HOSPITAL_BASED_OUTPATIENT_CLINIC_OR_DEPARTMENT_OTHER): Admitting: Internal Medicine

## 2023-10-31 DIAGNOSIS — I872 Venous insufficiency (chronic) (peripheral): Secondary | ICD-10-CM | POA: Diagnosis not present

## 2023-10-31 DIAGNOSIS — L97815 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 without evidence of necrosis: Secondary | ICD-10-CM | POA: Diagnosis not present

## 2023-10-31 DIAGNOSIS — E11622 Type 2 diabetes mellitus with other skin ulcer: Secondary | ICD-10-CM | POA: Diagnosis not present

## 2023-10-31 DIAGNOSIS — E11621 Type 2 diabetes mellitus with foot ulcer: Secondary | ICD-10-CM | POA: Diagnosis not present

## 2023-10-31 DIAGNOSIS — R600 Localized edema: Secondary | ICD-10-CM | POA: Diagnosis not present

## 2023-10-31 DIAGNOSIS — C44722 Squamous cell carcinoma of skin of right lower limb, including hip: Secondary | ICD-10-CM | POA: Diagnosis not present

## 2023-10-31 DIAGNOSIS — L97422 Non-pressure chronic ulcer of left heel and midfoot with fat layer exposed: Secondary | ICD-10-CM | POA: Diagnosis not present

## 2023-11-06 ENCOUNTER — Ambulatory Visit (INDEPENDENT_AMBULATORY_CARE_PROVIDER_SITE_OTHER): Payer: Medicare Other

## 2023-11-06 DIAGNOSIS — I441 Atrioventricular block, second degree: Secondary | ICD-10-CM | POA: Diagnosis not present

## 2023-11-07 ENCOUNTER — Encounter (HOSPITAL_BASED_OUTPATIENT_CLINIC_OR_DEPARTMENT_OTHER): Admitting: Internal Medicine

## 2023-11-07 DIAGNOSIS — C44722 Squamous cell carcinoma of skin of right lower limb, including hip: Secondary | ICD-10-CM | POA: Diagnosis not present

## 2023-11-07 DIAGNOSIS — R600 Localized edema: Secondary | ICD-10-CM | POA: Diagnosis not present

## 2023-11-07 DIAGNOSIS — E11622 Type 2 diabetes mellitus with other skin ulcer: Secondary | ICD-10-CM | POA: Diagnosis not present

## 2023-11-07 DIAGNOSIS — L97815 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 without evidence of necrosis: Secondary | ICD-10-CM | POA: Diagnosis not present

## 2023-11-07 DIAGNOSIS — E11621 Type 2 diabetes mellitus with foot ulcer: Secondary | ICD-10-CM | POA: Diagnosis not present

## 2023-11-07 DIAGNOSIS — I872 Venous insufficiency (chronic) (peripheral): Secondary | ICD-10-CM | POA: Diagnosis not present

## 2023-11-07 DIAGNOSIS — L97422 Non-pressure chronic ulcer of left heel and midfoot with fat layer exposed: Secondary | ICD-10-CM | POA: Diagnosis not present

## 2023-11-07 LAB — CUP PACEART REMOTE DEVICE CHECK
Battery Remaining Longevity: 96 mo
Battery Voltage: 3.01 V
Brady Statistic RV Percent Paced: 4.29 %
Date Time Interrogation Session: 20250516060800
Implantable Pulse Generator Implant Date: 20210817
Lead Channel Impedance Value: 450 Ohm
Lead Channel Pacing Threshold Amplitude: 0.375 V
Lead Channel Pacing Threshold Pulse Width: 0.4 ms
Lead Channel Sensing Intrinsic Amplitude: 16.65 mV
Lead Channel Setting Pacing Amplitude: 1 V
Lead Channel Setting Pacing Pulse Width: 0.4 ms
Lead Channel Setting Sensing Sensitivity: 2 mV

## 2023-11-09 ENCOUNTER — Ambulatory Visit: Payer: Self-pay | Admitting: Cardiology

## 2023-11-13 ENCOUNTER — Encounter: Payer: Self-pay | Admitting: Student

## 2023-11-13 ENCOUNTER — Ambulatory Visit (INDEPENDENT_AMBULATORY_CARE_PROVIDER_SITE_OTHER): Admitting: Student

## 2023-11-13 VITALS — BP 123/58 | HR 55 | Ht 68.0 in | Wt 168.2 lb

## 2023-11-13 DIAGNOSIS — L97809 Non-pressure chronic ulcer of other part of unspecified lower leg with unspecified severity: Secondary | ICD-10-CM

## 2023-11-13 DIAGNOSIS — R29898 Other symptoms and signs involving the musculoskeletal system: Secondary | ICD-10-CM | POA: Diagnosis not present

## 2023-11-13 DIAGNOSIS — I83008 Varicose veins of unspecified lower extremity with ulcer other part of lower leg: Secondary | ICD-10-CM

## 2023-11-13 DIAGNOSIS — R4189 Other symptoms and signs involving cognitive functions and awareness: Secondary | ICD-10-CM

## 2023-11-13 DIAGNOSIS — G6289 Other specified polyneuropathies: Secondary | ICD-10-CM

## 2023-11-13 NOTE — Assessment & Plan Note (Signed)
 Ongoing for several months. Is supposed to have a PET scan but unsure if scheduled. Will send message to neurology about updates. Due for neurocognitive testing.

## 2023-11-13 NOTE — Progress Notes (Signed)
    SUBJECTIVE:   CHIEF COMPLAINT / HPI:   Continued Memory Loss: - ongoing for some time now  She experiences right hand weakness characterized by dropping items unexpectedly four to eight times a month. The weakness occurs suddenly without pain. Episodes of stiffness, described as 'trigger finger', occur every two to three weeks, lasting seconds to two minutes. There is no numbness or tingling in the right hand.  She has not started speech therapy for memory issues. Neurocognitive testing has been conducted, and she awaits further evaluation. She is uncertain about having undergone a PET scan.  Significant neck pain is present, which may contribute to her hand symptoms. She sometimes moves her head up and down but is unsure about side-to-side movement.  She is under wound care for foot lesions treated with a chemical agent. The lesions have been present since October 14, 2023, and were still healing as of Oct 31, 2023. She has a history of skin cancer.  She is currently on seven medications, although they were not brought to the appointment.  PERTINENT  PMH / PSH:  Coronary artery disease  Hypertension - Zestoretic  and spironolactone   Paroxysmal A-fib obesity with sleep apnea -- Eliquis   GERD  short-bowel syndrome h/o bariatric surgery -- restarted home bariatric vitamins   Hyperparathyroidism  Hypothyroidism - TSH 12/2022 normal  Hyperlipidemia  Parathyroid  adenoma Peripheral neuropathy Age-related cognitive decline B12 deficiency - discontinued supplementation as she had elevated B12 on last check  Pacemaker Major depression  Vitamin D  deficiency   OBJECTIVE:   BP (!) 123/58   Pulse (!) 55   Ht 5\' 8"  (1.727 m)   Wt 168 lb 3.2 oz (76.3 kg)   SpO2 97%   BMI 25.57 kg/m   General: Alert and oriented in no apparent distress Heart: Regular rate and rhythm with no murmurs appreciated Lungs: CTA bilaterally, no wheezing Abdomen: Bowel sounds present, no abdominal pain Skin:  Warm and dry Extremities: ACE bandage around right lower extremity Has post op shoe on left foot  Neuro: BUE and BLE 5/5 strength testing  Neck: Limited flex and ext, normal rotation to left and right   ASSESSMENT/PLAN:   Assessment & Plan Cognitive impairment Ongoing for several months. Is supposed to have a PET scan but unsure if scheduled. Will send message to neurology about updates. Due for neurocognitive testing.  Right hand weakness With neck pain, concern for demyelinating lesion given history and need imaging to assess Cspine, MR ordered w and w/o.  Venous stasis ulcer of other part of lower leg, unspecified laterality, unspecified ulcer stage, unspecified whether varicose veins present (HCC) Dressings in place, known to wound care. Defer to their expertise. No systemic symptoms.      Ernestina Headland, MD [facility]

## 2023-11-13 NOTE — Patient Instructions (Addendum)
 It was great to see you today! Thank you for choosing [facility] for your primary care.  Today we addressed: I will message neurology The clinic will call to schedule MRI of the neck  Return in 1 month   If you haven't already, sign up for My Chart to have easy access to your labs results, and communication with your primary care physician.   Please arrive 15 minutes before your appointment to ensure smooth check in process.  We appreciate your efforts in making this happen.  Thank you for allowing me to participate in your care, Ernestina Headland, MD 11/13/2023, 10:38 AM PGY-3, [facility]

## 2023-11-13 NOTE — Assessment & Plan Note (Signed)
 Dressings in place, known to wound care. Defer to their expertise. No systemic symptoms.

## 2023-11-14 ENCOUNTER — Encounter (HOSPITAL_BASED_OUTPATIENT_CLINIC_OR_DEPARTMENT_OTHER): Admitting: Internal Medicine

## 2023-11-18 ENCOUNTER — Encounter: Payer: Self-pay | Admitting: Student

## 2023-11-18 ENCOUNTER — Telehealth: Payer: Self-pay

## 2023-11-18 NOTE — Telephone Encounter (Signed)
 Receptionist from hospital stated that we were not allow at the moment to schedule anything at the hospital right now.  Receptionist also stated that another department will call the patient to get her scheduled elsewhere for this MRI.  Contacted the patient to inform her what was going on;  patient stated she will be looking forward to their call.

## 2023-11-18 NOTE — Telephone Encounter (Signed)
-----   Message from [facility] Genevia Kern S sent at 11/14/2023  9:33 AM EDT ----- Ok to schedule MRI at [facility]  Thank you! Clovis Dar

## 2023-11-21 ENCOUNTER — Encounter (HOSPITAL_BASED_OUTPATIENT_CLINIC_OR_DEPARTMENT_OTHER): Admitting: General Surgery

## 2023-11-21 DIAGNOSIS — L97815 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 without evidence of necrosis: Secondary | ICD-10-CM | POA: Diagnosis not present

## 2023-11-21 DIAGNOSIS — R600 Localized edema: Secondary | ICD-10-CM | POA: Diagnosis not present

## 2023-11-21 DIAGNOSIS — E11621 Type 2 diabetes mellitus with foot ulcer: Secondary | ICD-10-CM | POA: Diagnosis not present

## 2023-11-21 DIAGNOSIS — L97422 Non-pressure chronic ulcer of left heel and midfoot with fat layer exposed: Secondary | ICD-10-CM | POA: Diagnosis not present

## 2023-11-21 DIAGNOSIS — L97812 Non-pressure chronic ulcer of other part of right lower leg with fat layer exposed: Secondary | ICD-10-CM | POA: Diagnosis not present

## 2023-11-21 DIAGNOSIS — I872 Venous insufficiency (chronic) (peripheral): Secondary | ICD-10-CM | POA: Diagnosis not present

## 2023-11-21 DIAGNOSIS — C44722 Squamous cell carcinoma of skin of right lower limb, including hip: Secondary | ICD-10-CM | POA: Diagnosis not present

## 2023-11-21 DIAGNOSIS — E11622 Type 2 diabetes mellitus with other skin ulcer: Secondary | ICD-10-CM | POA: Diagnosis not present

## 2023-11-26 ENCOUNTER — Ambulatory Visit: Admitting: Podiatry

## 2023-11-27 ENCOUNTER — Ambulatory Visit (HOSPITAL_BASED_OUTPATIENT_CLINIC_OR_DEPARTMENT_OTHER): Admitting: General Surgery

## 2023-11-27 NOTE — CV Procedure (Signed)
  Device system confirmed to be MRI conditional, with implant date > 6 weeks ago, and no evidence of abandoned or epicardial leads in review of most recent CXR  Device last cleared by EP Provider: Suzann Riddle 11/26/23  Clearance is good through for 1 year as long as parameters remain stable at time of check. If pt undergoes a cardiac device procedure during that time, they should be re-cleared.   Tachy-therapies to be programmed off if applicable with device back to pre-MRI settings after completion of exam.  [employer] - Programming recommendation received through [employer] App/Tablet  Arlys Lamer, RT  11/27/2023 3:44 PM

## 2023-12-01 ENCOUNTER — Encounter (HOSPITAL_BASED_OUTPATIENT_CLINIC_OR_DEPARTMENT_OTHER): Attending: General Surgery | Admitting: General Surgery

## 2023-12-01 DIAGNOSIS — L97422 Non-pressure chronic ulcer of left heel and midfoot with fat layer exposed: Secondary | ICD-10-CM | POA: Diagnosis not present

## 2023-12-01 DIAGNOSIS — E11621 Type 2 diabetes mellitus with foot ulcer: Secondary | ICD-10-CM | POA: Insufficient documentation

## 2023-12-01 DIAGNOSIS — R600 Localized edema: Secondary | ICD-10-CM | POA: Diagnosis not present

## 2023-12-01 DIAGNOSIS — C44722 Squamous cell carcinoma of skin of right lower limb, including hip: Secondary | ICD-10-CM | POA: Diagnosis not present

## 2023-12-01 DIAGNOSIS — E11622 Type 2 diabetes mellitus with other skin ulcer: Secondary | ICD-10-CM | POA: Diagnosis not present

## 2023-12-01 DIAGNOSIS — L97815 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 without evidence of necrosis: Secondary | ICD-10-CM | POA: Diagnosis not present

## 2023-12-01 DIAGNOSIS — I872 Venous insufficiency (chronic) (peripheral): Secondary | ICD-10-CM | POA: Diagnosis not present

## 2023-12-02 ENCOUNTER — Encounter: Payer: Self-pay | Admitting: *Deleted

## 2023-12-04 ENCOUNTER — Ambulatory Visit (HOSPITAL_BASED_OUTPATIENT_CLINIC_OR_DEPARTMENT_OTHER): Admitting: General Surgery

## 2023-12-05 ENCOUNTER — Ambulatory Visit (HOSPITAL_COMMUNITY)
Admission: RE | Admit: 2023-12-05 | Discharge: 2023-12-05 | Disposition: A | Discharge status: Home / Self Care | Source: Ambulatory Visit | Attending: Family Medicine | Admitting: Family Medicine

## 2023-12-05 DIAGNOSIS — M5021 Other cervical disc displacement,  high cervical region: Secondary | ICD-10-CM | POA: Diagnosis not present

## 2023-12-05 DIAGNOSIS — M4802 Spinal stenosis, cervical region: Secondary | ICD-10-CM | POA: Diagnosis not present

## 2023-12-05 DIAGNOSIS — R29898 Other symptoms and signs involving the musculoskeletal system: Secondary | ICD-10-CM | POA: Insufficient documentation

## 2023-12-05 DIAGNOSIS — M4803 Spinal stenosis, cervicothoracic region: Secondary | ICD-10-CM | POA: Diagnosis not present

## 2023-12-05 DIAGNOSIS — M47812 Spondylosis without myelopathy or radiculopathy, cervical region: Secondary | ICD-10-CM | POA: Diagnosis not present

## 2023-12-05 MED ORDER — GADOBUTROL 1 MMOL/ML IV SOLN
7.0000 mL | Freq: Once | INTRAVENOUS | Status: AC | PRN
  Administered 2023-12-05: 7 mL via INTRAVENOUS

## 2023-12-12 ENCOUNTER — Encounter (HOSPITAL_BASED_OUTPATIENT_CLINIC_OR_DEPARTMENT_OTHER): Admitting: General Surgery

## 2023-12-12 ENCOUNTER — Ambulatory Visit: Payer: Self-pay | Admitting: Student

## 2023-12-12 DIAGNOSIS — L97815 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 without evidence of necrosis: Secondary | ICD-10-CM | POA: Diagnosis not present

## 2023-12-12 DIAGNOSIS — E11621 Type 2 diabetes mellitus with foot ulcer: Secondary | ICD-10-CM | POA: Diagnosis not present

## 2023-12-12 DIAGNOSIS — L97422 Non-pressure chronic ulcer of left heel and midfoot with fat layer exposed: Secondary | ICD-10-CM | POA: Diagnosis not present

## 2023-12-12 DIAGNOSIS — I872 Venous insufficiency (chronic) (peripheral): Secondary | ICD-10-CM | POA: Diagnosis not present

## 2023-12-12 DIAGNOSIS — C44722 Squamous cell carcinoma of skin of right lower limb, including hip: Secondary | ICD-10-CM | POA: Diagnosis not present

## 2023-12-12 DIAGNOSIS — R600 Localized edema: Secondary | ICD-10-CM | POA: Diagnosis not present

## 2023-12-12 DIAGNOSIS — E11622 Type 2 diabetes mellitus with other skin ulcer: Secondary | ICD-10-CM | POA: Diagnosis not present

## 2023-12-15 ENCOUNTER — Ambulatory Visit (INDEPENDENT_AMBULATORY_CARE_PROVIDER_SITE_OTHER): Admitting: Student

## 2023-12-15 ENCOUNTER — Encounter: Payer: Self-pay | Admitting: Student

## 2023-12-15 VITALS — BP 130/67 | HR 55 | Ht 68.0 in | Wt 163.4 lb

## 2023-12-15 DIAGNOSIS — Z1231 Encounter for screening mammogram for malignant neoplasm of breast: Secondary | ICD-10-CM | POA: Diagnosis not present

## 2023-12-15 DIAGNOSIS — E1140 Type 2 diabetes mellitus with diabetic neuropathy, unspecified: Secondary | ICD-10-CM

## 2023-12-15 DIAGNOSIS — R21 Rash and other nonspecific skin eruption: Secondary | ICD-10-CM

## 2023-12-15 DIAGNOSIS — D509 Iron deficiency anemia, unspecified: Secondary | ICD-10-CM

## 2023-12-15 DIAGNOSIS — R4189 Other symptoms and signs involving cognitive functions and awareness: Secondary | ICD-10-CM

## 2023-12-15 MED ORDER — CLOTRIMAZOLE 1 % EX CREA: 1.0000 | TOPICAL_CREAM | Freq: Two times a day (BID) | CUTANEOUS | 0 refills | Status: AC

## 2023-12-15 NOTE — Progress Notes (Signed)
    SUBJECTIVE:   CHIEF COMPLAINT / HPI:   Cognitive Impairment:  - Patient was instructed via messaging to schedule PET scan with neurology.  - has yet to schedule, asking for phone number   Sees Wound Care for multiple lower extremity wounds   She experiences severe neck pain and tingling in her arms, especially when leaning over or standing up, necessitating neck support. She has not had neck surgery but underwent back surgery two years ago, which initially relieved her symptoms. There is no fever, chills, or drainage from the neck area.  She has a history of cancer with a spot removed from her leg and attends a wound clinic for leg ulcers. She manages these by elevating her legs, and some ulcers show black discoloration.  She has experienced significant weight loss, now weighing 163 pounds, attributed to eating once a day, primarily soup. She is not actively trying to lose weight.  She has a persistent umbilical issue described as 'gross' for about seven months, without drainage, fever, or chills. Redness around belly button       PERTINENT  PMH / PSH:  Coronary artery disease  Hypertension - Zestoretic  and spironolactone   Paroxysmal A-fib  obesity with sleep apnea -- Eliquis   GERD  short-bowel syndrome h/o bariatric surgery -- restarted home bariatric vitamins   Hyperparathyroidism  Hypothyroidism - TSH 12/2022 normal  Hyperlipidemia  Parathyroid  adenoma Peripheral neuropathy Age-related cognitive decline - see above  B12 deficiency - discontinued supplementation as she had elevated B12 on last check  Pacemaker  Major depression  Vitamin D  deficiency   OBJECTIVE:   BP 130/67   Pulse (!) 55   Ht 5' 8 (1.727 m)   Wt 163 lb 6.4 oz (74.1 kg)   SpO2 97%   BMI 24.84 kg/m   General: Alert and oriented in no apparent distress Heart: Regular rate and rhythm with no murmurs appreciated Lungs: CTA bilaterally, no wheezing Abdomen: Bowel sounds present, no abdominal  pain Skin: Warm and dry, well demarcated redness without drainage in the umbilicus  No drainage    ASSESSMENT/PLAN:   Assessment & Plan Type 2 diabetes, controlled, with neuropathy (HCC) Well controlled. Recheck Cmet today. No changes to medications   Encounter for screening mammogram for malignant neoplasm of breast MM ordered  Iron deficiency anemia, unspecified iron deficiency anemia type H/o, check UTD CBC  Cognitive impairment Seeing neurology, has yet to schedule PET after abnormal ATN profile. Encouraged patient to schedule and provided with phone number to call.  Rash Unknown etiology, no systemic symptoms. Possibly fungal - treat with antifungal first and instructed to call if not improved in a couple of weeks.      Laurier Hugger, MD [facility]

## 2023-12-15 NOTE — Addendum Note (Signed)
 Addended by: VICCI SELLER A on: 12/15/2023 02:24 PM   Modules accepted: Orders

## 2023-12-15 NOTE — Patient Instructions (Addendum)
 It was great to see you today! Thank you for choosing [facility] for your primary care.  Today we addressed: Call and schedule neurology appt - call need to schedule a PET scan. Last saw Harlene Bogaert   [facility] [address] [place], [place] [address] Phone [phone]  I will update with the cervical MRI results  We may need to refer you to a specialist for your neck  Call breast center for mammogram [address]  [phone]  Use lotrimin cream twice a day for 15 days   Call if not improving   If you haven't already, sign up for My Chart to have easy access to your labs results, and communication with your primary care physician.  No follow-ups on file. Please arrive 15 minutes before your appointment to ensure smooth check in process.  We appreciate your efforts in making this happen.  Thank you for allowing me to participate in your care, Laurier Hugger, MD 12/15/2023, 9:55 AM PGY-3, [facility]

## 2023-12-15 NOTE — Progress Notes (Signed)
 Remote pacemaker transmission.

## 2023-12-15 NOTE — Assessment & Plan Note (Signed)
 Seeing neurology, has yet to schedule PET after abnormal ATN profile. Encouraged patient to schedule and provided with phone number to call.

## 2023-12-15 NOTE — Telephone Encounter (Signed)
 Patient called in and wanted to proceed with the PET scan - can it please be sent to [facility] for scheduling?  Thanks!

## 2023-12-15 NOTE — Assessment & Plan Note (Signed)
 H/o, check UTD CBC

## 2023-12-15 NOTE — Assessment & Plan Note (Signed)
 Well controlled. Recheck Cmet today. No changes to medications

## 2023-12-16 ENCOUNTER — Ambulatory Visit: Payer: Self-pay | Admitting: Student

## 2023-12-16 ENCOUNTER — Encounter: Payer: Self-pay | Admitting: Gastroenterology

## 2023-12-16 DIAGNOSIS — R7989 Other specified abnormal findings of blood chemistry: Secondary | ICD-10-CM

## 2023-12-16 LAB — CBC WITH DIFFERENTIAL/PLATELET
Basophils Absolute: 0 10*3/uL (ref 0.0–0.2)
Basos: 1 %
EOS (ABSOLUTE): 0.2 10*3/uL (ref 0.0–0.4)
Eos: 4 %
Hematocrit: 37.7 % (ref 34.0–46.6)
Hemoglobin: 11.1 g/dL (ref 11.1–15.9)
Immature Grans (Abs): 0 10*3/uL (ref 0.0–0.1)
Immature Granulocytes: 0 %
Lymphocytes Absolute: 1.1 10*3/uL (ref 0.7–3.1)
Lymphs: 26 %
MCH: 23.9 pg — ABNORMAL LOW (ref 26.6–33.0)
MCHC: 29.4 g/dL — ABNORMAL LOW (ref 31.5–35.7)
MCV: 81 fL (ref 79–97)
Monocytes Absolute: 0.3 10*3/uL (ref 0.1–0.9)
Monocytes: 8 %
Neutrophils Absolute: 2.6 10*3/uL (ref 1.4–7.0)
Neutrophils: 61 %
Platelets: 217 10*3/uL (ref 150–450)
RBC: 4.65 x10E6/uL (ref 3.77–5.28)
RDW: 19.5 % — ABNORMAL HIGH (ref 11.7–15.4)
WBC: 4.2 10*3/uL (ref 3.4–10.8)

## 2023-12-16 LAB — COMPREHENSIVE METABOLIC PANEL WITH GFR
ALT: 12 IU/L (ref 0–32)
AST: 21 IU/L (ref 0–40)
Albumin: 4.7 g/dL (ref 3.9–4.9)
Alkaline Phosphatase: 117 IU/L (ref 44–121)
BUN/Creatinine Ratio: 25 (ref 12–28)
BUN: 52 mg/dL — ABNORMAL HIGH (ref 8–27)
Bilirubin Total: 0.3 mg/dL (ref 0.0–1.2)
CO2: 18 mmol/L — ABNORMAL LOW (ref 20–29)
Calcium: 9.7 mg/dL (ref 8.7–10.3)
Chloride: 105 mmol/L (ref 96–106)
Creatinine, Ser: 2.06 mg/dL — ABNORMAL HIGH (ref 0.57–1.00)
Globulin, Total: 2.3 g/dL (ref 1.5–4.5)
Glucose: 72 mg/dL (ref 70–99)
Potassium: 5.1 mmol/L (ref 3.5–5.2)
Sodium: 140 mmol/L (ref 134–144)
Total Protein: 7 g/dL (ref 6.0–8.5)
eGFR: 26 mL/min/{1.73_m2} — ABNORMAL LOW (ref >59)

## 2023-12-16 NOTE — Progress Notes (Signed)
 Cr 1.22 > 2, BUN 52, GFR 26 - stop any OTC medications, bring all medications to next visit. Lab visit in one week for repeat BMP and Uacr.   Hey team - can we call to schedule lab visit in one week for this patient?

## 2023-12-16 NOTE — Telephone Encounter (Signed)
 Order to complete PET scan placed. Please advise patient. Thank you.

## 2023-12-16 NOTE — Progress Notes (Signed)
 Patient is scheduled for a lab visit 07/01 @915 

## 2023-12-18 ENCOUNTER — Ambulatory Visit (HOSPITAL_COMMUNITY)
Admission: RE | Admit: 2023-12-18 | Discharge: 2023-12-18 | Disposition: A | Discharge status: Home / Self Care | Source: Ambulatory Visit | Attending: General Surgery | Admitting: General Surgery

## 2023-12-18 ENCOUNTER — Other Ambulatory Visit (HOSPITAL_COMMUNITY): Payer: Self-pay | Admitting: General Surgery

## 2023-12-18 DIAGNOSIS — L97422 Non-pressure chronic ulcer of left heel and midfoot with fat layer exposed: Secondary | ICD-10-CM

## 2023-12-18 DIAGNOSIS — M85872 Other specified disorders of bone density and structure, left ankle and foot: Secondary | ICD-10-CM | POA: Diagnosis not present

## 2023-12-18 DIAGNOSIS — M79672 Pain in left foot: Secondary | ICD-10-CM | POA: Diagnosis not present

## 2023-12-19 ENCOUNTER — Encounter (HOSPITAL_BASED_OUTPATIENT_CLINIC_OR_DEPARTMENT_OTHER): Admitting: General Surgery

## 2023-12-19 DIAGNOSIS — E11621 Type 2 diabetes mellitus with foot ulcer: Secondary | ICD-10-CM | POA: Diagnosis not present

## 2023-12-19 DIAGNOSIS — I872 Venous insufficiency (chronic) (peripheral): Secondary | ICD-10-CM | POA: Diagnosis not present

## 2023-12-19 DIAGNOSIS — C44722 Squamous cell carcinoma of skin of right lower limb, including hip: Secondary | ICD-10-CM | POA: Diagnosis not present

## 2023-12-19 DIAGNOSIS — L97815 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 without evidence of necrosis: Secondary | ICD-10-CM | POA: Diagnosis not present

## 2023-12-19 DIAGNOSIS — E11622 Type 2 diabetes mellitus with other skin ulcer: Secondary | ICD-10-CM | POA: Diagnosis not present

## 2023-12-19 DIAGNOSIS — R600 Localized edema: Secondary | ICD-10-CM | POA: Diagnosis not present

## 2023-12-19 DIAGNOSIS — L97422 Non-pressure chronic ulcer of left heel and midfoot with fat layer exposed: Secondary | ICD-10-CM | POA: Diagnosis not present

## 2023-12-22 ENCOUNTER — Ambulatory Visit (HOSPITAL_BASED_OUTPATIENT_CLINIC_OR_DEPARTMENT_OTHER): Admitting: General Surgery

## 2023-12-22 ENCOUNTER — Ambulatory Visit: Payer: Self-pay | Admitting: Student

## 2023-12-23 ENCOUNTER — Other Ambulatory Visit

## 2023-12-23 DIAGNOSIS — R7989 Other specified abnormal findings of blood chemistry: Secondary | ICD-10-CM

## 2023-12-24 LAB — BASIC METABOLIC PANEL WITH GFR
BUN/Creatinine Ratio: 22 (ref 12–28)
BUN: 37 mg/dL — ABNORMAL HIGH (ref 8–27)
CO2: 19 mmol/L — ABNORMAL LOW (ref 20–29)
Calcium: 9.3 mg/dL (ref 8.7–10.3)
Chloride: 105 mmol/L (ref 96–106)
Creatinine, Ser: 1.67 mg/dL — ABNORMAL HIGH (ref 0.57–1.00)
Glucose: 125 mg/dL — ABNORMAL HIGH (ref 70–99)
Potassium: 5.2 mmol/L (ref 3.5–5.2)
Sodium: 140 mmol/L (ref 134–144)
eGFR: 33 mL/min/{1.73_m2} — ABNORMAL LOW (ref >59)

## 2023-12-25 ENCOUNTER — Encounter (HOSPITAL_BASED_OUTPATIENT_CLINIC_OR_DEPARTMENT_OTHER): Attending: General Surgery | Admitting: General Surgery

## 2023-12-25 DIAGNOSIS — Z85828 Personal history of other malignant neoplasm of skin: Secondary | ICD-10-CM | POA: Diagnosis not present

## 2023-12-25 DIAGNOSIS — E11622 Type 2 diabetes mellitus with other skin ulcer: Secondary | ICD-10-CM | POA: Diagnosis not present

## 2023-12-25 DIAGNOSIS — L97422 Non-pressure chronic ulcer of left heel and midfoot with fat layer exposed: Secondary | ICD-10-CM | POA: Insufficient documentation

## 2023-12-25 DIAGNOSIS — E11621 Type 2 diabetes mellitus with foot ulcer: Secondary | ICD-10-CM | POA: Diagnosis not present

## 2023-12-25 DIAGNOSIS — L98492 Non-pressure chronic ulcer of skin of other sites with fat layer exposed: Secondary | ICD-10-CM | POA: Insufficient documentation

## 2023-12-25 DIAGNOSIS — L97815 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 without evidence of necrosis: Secondary | ICD-10-CM | POA: Diagnosis not present

## 2023-12-25 DIAGNOSIS — R600 Localized edema: Secondary | ICD-10-CM | POA: Insufficient documentation

## 2023-12-25 DIAGNOSIS — I872 Venous insufficiency (chronic) (peripheral): Secondary | ICD-10-CM | POA: Diagnosis not present

## 2024-01-01 ENCOUNTER — Encounter (HOSPITAL_COMMUNITY)
Admission: RE | Admit: 2024-01-01 | Discharge: 2024-01-01 | Disposition: A | Discharge status: Home / Self Care | Source: Ambulatory Visit | Attending: Adult Health | Admitting: Adult Health

## 2024-01-01 ENCOUNTER — Ambulatory Visit: Payer: Self-pay | Admitting: Adult Health

## 2024-01-01 DIAGNOSIS — R413 Other amnesia: Secondary | ICD-10-CM | POA: Insufficient documentation

## 2024-01-01 DIAGNOSIS — F0390 Unspecified dementia without behavioral disturbance: Secondary | ICD-10-CM | POA: Diagnosis not present

## 2024-01-01 MED ORDER — FLORBETAPIR F 18 500-1900 MBQ/ML IV SOLN
10.4200 | Freq: Once | INTRAVENOUS | Status: AC
  Administered 2024-01-01: 10.42 via INTRAVENOUS

## 2024-01-02 ENCOUNTER — Encounter (HOSPITAL_BASED_OUTPATIENT_CLINIC_OR_DEPARTMENT_OTHER): Admitting: General Surgery

## 2024-01-02 DIAGNOSIS — I872 Venous insufficiency (chronic) (peripheral): Secondary | ICD-10-CM | POA: Diagnosis not present

## 2024-01-02 DIAGNOSIS — E11622 Type 2 diabetes mellitus with other skin ulcer: Secondary | ICD-10-CM | POA: Diagnosis not present

## 2024-01-02 DIAGNOSIS — R600 Localized edema: Secondary | ICD-10-CM | POA: Diagnosis not present

## 2024-01-02 DIAGNOSIS — L97815 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 without evidence of necrosis: Secondary | ICD-10-CM | POA: Diagnosis not present

## 2024-01-02 DIAGNOSIS — L98492 Non-pressure chronic ulcer of skin of other sites with fat layer exposed: Secondary | ICD-10-CM | POA: Diagnosis not present

## 2024-01-02 DIAGNOSIS — E11621 Type 2 diabetes mellitus with foot ulcer: Secondary | ICD-10-CM | POA: Diagnosis not present

## 2024-01-02 DIAGNOSIS — L97422 Non-pressure chronic ulcer of left heel and midfoot with fat layer exposed: Secondary | ICD-10-CM | POA: Diagnosis not present

## 2024-01-06 NOTE — Telephone Encounter (Signed)
 Patients daughter picked up medications.

## 2024-01-07 NOTE — Telephone Encounter (Signed)
 LVM asking Pt's daughter [name] to call back so that I can go over pt' PET scan with her and treatment options.

## 2024-01-07 NOTE — Telephone Encounter (Addendum)
 Called pt with the below results per NP Harlene Bogaert. Pt stated that she would like for me to call her daughter and discuss with her daughter her results to see what treatment options they can do.   Calling Daughter now  ----- Message from Harlene Bogaert sent at 01/01/2024  2:44 PM EDT ----- Please call patient re: results of PET scan which was positive for amyloid which is consistent with Alzheimer's disease pathology.  Previously discussed infusible medications such as Leqembi or  [place]. If she is interested in pursing, please schedule a f/u visit with Dr. Onita to further discuss risk vs benefit. If not, could consider starting Aricept 10 mg nightly which can help slow  cognitive decline. Thank you.  ----- Message ----- From: Interface, Rad Results In Sent: 01/01/2024   2:01 PM EDT To: Harlene Bogaert, NP

## 2024-01-07 NOTE — Telephone Encounter (Signed)
 Daughter of pt has returned call to CMA

## 2024-01-07 NOTE — Telephone Encounter (Signed)
 Pt's Daughter Nat called me back and let her know pr's PET scan results and got pt scheduled to talk to Dr. Onita about treatment options on 02/05/2024 @ 4:00pm

## 2024-01-08 ENCOUNTER — Ambulatory Visit

## 2024-01-08 VITALS — BP 117/61 | HR 71 | Ht 68.0 in | Wt 160.8 lb

## 2024-01-08 DIAGNOSIS — E1140 Type 2 diabetes mellitus with diabetic neuropathy, unspecified: Secondary | ICD-10-CM

## 2024-01-08 DIAGNOSIS — Z9884 Bariatric surgery status: Secondary | ICD-10-CM

## 2024-01-08 DIAGNOSIS — H9190 Unspecified hearing loss, unspecified ear: Secondary | ICD-10-CM

## 2024-01-08 MED ORDER — ACCU-CHEK SOFTCLIX LANCETS MISC: 3 refills | Status: DC

## 2024-01-08 MED ORDER — ACCU-CHEK GUIDE TEST VI STRP: ORAL_STRIP | 12 refills | Status: AC

## 2024-01-08 MED ORDER — ACCU-CHEK GUIDE W/DEVICE KIT: PACK | 0 refills | Status: AC

## 2024-01-08 MED ORDER — ACCU-CHEK SOFTCLIX LANCET DEV KIT
PACK | 0 refills | Status: AC
Start: 2024-01-08 — End: ?

## 2024-01-08 NOTE — Patient Instructions (Signed)
 Placed referral to audiology for your hearing.  Placed referral to bariatric surgery.  Sent in sugar testing supplies.  Use clotrimazole  lotion around your belly button.  Follow up with me in 1 month and please bring all of your medicines to that appointment.

## 2024-01-08 NOTE — Progress Notes (Signed)
    SUBJECTIVE:   CHIEF COMPLAINT / HPI:   Follow-up/meet PCP  Patient was here today to meet me. She has a few concerns today. She mentions she was recently diagnosed with Alzheimer's and has an appointment with Neurology set pet. Patient presents with her daughter. Her daughter requests a referral for hearing aids. Daughter states her hearing is worse. Patient also mentions the need to be set up with a new bariatric surgeon. She has had bariatric surgery-duodenal switch with sleeve gastrectomy. She also has a history of short gut syndrome. She was previously taking Teduglutide , from her bariatric surgeon. Her last bariatric surgeon moved and she is seeking a new one to get her Teduglitide.   PERTINENT  PMH / PSH: s/p bariatric surgery-duodenal switch with sleeve gastrectomy, short gut syndrome  OBJECTIVE:   BP 117/61   Pulse 71   Ht 5' 8 (1.727 m)   Wt 160 lb 12.8 oz (72.9 kg)   SpO2 100%   BMI 24.45 kg/m   General: awake, alert, well-appearing female Cardiovascular: RRR, no murmurs, rubs, or gallops  Respiratory: CTAB, normal work of normal breathing  Abdomen: soft, non-tender, non-distended, bowel sounds presents   ASSESSMENT/PLAN:   Assessment & Plan Hearing disorder, unspecified laterality Last audiology referral in 2021.  - audiology referral  S/P bariatric surgery-duodenal switch with sleeve gastrectomy - Bariatric surgery referral Type 2 diabetes, controlled, with neuropathy (HCC) Most recent A1c 6.1.  - sent in diabetic glucose check supplies   Raguel KANDICE Lee, DO [facility]

## 2024-01-08 NOTE — Assessment & Plan Note (Signed)
Bariatric surgery referral

## 2024-01-08 NOTE — Assessment & Plan Note (Signed)
 Most recent A1c 6.1.  - sent in diabetic glucose check supplies

## 2024-01-09 ENCOUNTER — Ambulatory Visit: Admitting: Podiatry

## 2024-01-09 ENCOUNTER — Encounter (HOSPITAL_BASED_OUTPATIENT_CLINIC_OR_DEPARTMENT_OTHER): Admitting: General Surgery

## 2024-01-09 DIAGNOSIS — E11622 Type 2 diabetes mellitus with other skin ulcer: Secondary | ICD-10-CM | POA: Diagnosis not present

## 2024-01-09 DIAGNOSIS — R600 Localized edema: Secondary | ICD-10-CM | POA: Diagnosis not present

## 2024-01-09 DIAGNOSIS — L97422 Non-pressure chronic ulcer of left heel and midfoot with fat layer exposed: Secondary | ICD-10-CM | POA: Diagnosis not present

## 2024-01-09 DIAGNOSIS — L97815 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 without evidence of necrosis: Secondary | ICD-10-CM | POA: Diagnosis not present

## 2024-01-09 DIAGNOSIS — E11621 Type 2 diabetes mellitus with foot ulcer: Secondary | ICD-10-CM | POA: Diagnosis not present

## 2024-01-09 DIAGNOSIS — L98492 Non-pressure chronic ulcer of skin of other sites with fat layer exposed: Secondary | ICD-10-CM | POA: Diagnosis not present

## 2024-01-12 ENCOUNTER — Other Ambulatory Visit: Payer: Self-pay

## 2024-01-12 DIAGNOSIS — E1140 Type 2 diabetes mellitus with diabetic neuropathy, unspecified: Secondary | ICD-10-CM

## 2024-01-15 ENCOUNTER — Ambulatory Visit: Payer: Medicare Other

## 2024-01-15 ENCOUNTER — Telehealth: Payer: Self-pay | Admitting: Family Medicine

## 2024-01-15 VITALS — Ht 68.0 in | Wt 160.0 lb

## 2024-01-15 DIAGNOSIS — Z0000 Encounter for general adult medical examination without abnormal findings: Secondary | ICD-10-CM

## 2024-01-15 NOTE — Patient Instructions (Signed)
 [name] , Thank you for taking time out of your busy schedule to complete your Annual Wellness Visit with me. I enjoyed our conversation and look forward to speaking with you again next year. I, as well as your care team,  appreciate your ongoing commitment to your health goals. Please review the following plan we discussed and let me know if I can assist you in the future. Your Game plan/ To Do List    Referrals: If you haven't heard from the office you've been referred to, please reach out to them at the phone provided.  You are due for the following screenings: Eye Exam, Foot Exam, Colonoscopy and Mammogram.  Follow up Visits: Next Medicare AWV with our clinical staff: 01/17/2025 at 8:30 a.m. phone visit with Nurse Health Advisor   Have you seen your provider in the last 6 months (3 months if uncontrolled diabetes)? Yes Next Office Visit with your provider: prn  Clinician Recommendations:  Aim for 30 minutes of exercise or brisk walking, 6-8 glasses of water , and 5 servings of fruits and vegetables each day.       This is a list of the screening recommended for you and due dates:  Health Maintenance  Topic Date Due   DTaP/Tdap/Td vaccine (3 - Td or Tdap) 09/16/2021   Zoster (Shingles) Vaccine (2 of 2) 01/07/2022   Mammogram  10/12/2022   Eye exam for diabetics  01/29/2023   Complete foot exam   06/29/2023   COVID-19 Vaccine (7 - Moderna risk 2024-25 season) 10/26/2023   Colon Cancer Screening  12/17/2023   Flu Shot  01/23/2024   Hemoglobin A1C  02/28/2024   Yearly kidney health urinalysis for diabetes  04/27/2024   Yearly kidney function blood test for diabetes  12/22/2024   Medicare Annual Wellness Visit  01/14/2025   Pneumococcal Vaccine for age over 60  Completed   DEXA scan (bone density measurement)  Completed   Hepatitis C Screening  Completed   Hepatitis B Vaccine  Aged Out   HPV Vaccine  Aged Out   Meningitis B Vaccine  Aged Out    Advanced directives: (Copy  Requested) Please bring a copy of your health care power of attorney and living will to the office to be added to your chart at your convenience. You can mail to [facility] [address] [place], [place] [address] or email to [email or web address] Advance Care Planning is important because it:  [x]  Makes sure you receive the medical care that is consistent with your values, goals, and preferences  [x]  It provides guidance to your family and loved ones and reduces their decisional burden about whether or not they are making the right decisions based on your wishes.  Follow the link provided in your after visit summary or read over the paperwork we have mailed to you to help you started getting your Advance Directives in place. If you need assistance in completing these, please reach out to us  so that we can help you!  See attachments for Preventive Care and Fall Prevention Tips.

## 2024-01-15 NOTE — Progress Notes (Signed)
 Because this visit was a virtual/telehealth visit,  certain criteria was not obtained, such a blood pressure, CBG if applicable, and timed get up and go. Any medications not marked as taking were not mentioned during the medication reconciliation part of the visit. Any vitals not documented were not able to be obtained due to this being a telehealth visit or patient was unable to self-report a recent blood pressure reading due to a lack of equipment at home via telehealth. Vitals that have been documented are verbally provided by the patient.   Subjective:   [name] is a 68 y.o. who presents for a Medicare Wellness preventive visit.  As a reminder, Annual Wellness Visits don't include a physical exam, and some assessments may be limited, especially if this visit is performed virtually. We may recommend an in-person follow-up visit with your provider if needed.  Visit Complete: Virtual I connected with  Marykathleen L Dietz on 01/15/24 by a audio enabled telemedicine application and verified that I am speaking with the correct person using two identifiers.  Patient Location: Home  Provider Location: Home Office  I discussed the limitations of evaluation and management by telemedicine. The patient expressed understanding and agreed to proceed.  Vital Signs: Because this visit was a virtual/telehealth visit, some criteria may be missing or patient reported. Any vitals not documented were not able to be obtained and vitals that have been documented are patient reported.  VideoDeclined- This patient declined Librarian, academic. Therefore the visit was completed with audio only.  Persons Participating in Visit: Patient.  AWV Questionnaire: No: Patient Medicare AWV questionnaire was not completed prior to this visit.  Cardiac Risk Factors include: advanced age (>59men, >18 women);diabetes mellitus;dyslipidemia;family history of premature cardiovascular  disease;hypertension;sedentary lifestyle     Objective:    Today's Vitals   01/15/24 0832  Weight: 160 lb (72.6 kg)  Height: 5' 8 (1.727 m)  PainSc: 0-No pain   Body mass index is 24.33 kg/m.     01/15/2024    8:39 AM 12/15/2023    9:32 AM 11/13/2023   10:25 AM 08/28/2023   10:41 AM 06/16/2023    9:37 AM 05/29/2023    1:42 PM 05/27/2023   10:04 AM  Advanced Directives  Does Patient Have a Medical Advance Directive? Yes No No No No No No  Type of Estate agent of [place];Living will        Copy of Healthcare Power of Attorney in Chart? No - copy requested        Would patient like information on creating a medical advance directive? No - Patient declined No - Patient declined No - Patient declined No - Patient declined No - Patient declined No - Patient declined No - Patient declined    Current Medications (verified) Outpatient Encounter Medications as of 01/15/2024  Medication Sig   Accu-Chek Softclix Lancets lancets USE TO CHECK BLOOD SUGAR ONCE DAILY   atorvastatin  (LIPITOR ) 40 MG tablet TAKE 1 TABLET BY MOUTH EVERY DAY   blood glucose meter kit and supplies KIT Dispense based on patient and insurance preference. Use up to four times daily as directed.   Blood Glucose Monitoring Suppl (ACCU-CHEK GUIDE) w/Device KIT Check blood sugar once daily   buPROPion  (WELLBUTRIN  XL) 150 MG 24 hr tablet Take 1 tablet (150 mg total) by mouth daily.   ELIQUIS  5 MG TABS tablet TAKE 1 TABLET BY MOUTH TWICE A DAY   gabapentin  (NEURONTIN ) 300 MG capsule Take  1 capsule (300 mg total) by mouth 2 (two) times daily.   glucose blood (ACCU-CHEK GUIDE TEST) test strip Use as instructed   glucose blood (ONETOUCH VERIO) test strip 1 each by Other route as needed for other. Use as instructed   Lancets Misc. (ACCU-CHEK SOFTCLIX LANCET DEV) KIT Check blood sugar once daily   lisinopril -hydrochlorothiazide  (ZESTORETIC ) 20-25 MG tablet Take 1 tablet by mouth daily.   Multiple  Vitamins-Minerals (MULTIMINERAL PLUS PO) Take 1 tablet by mouth daily.   Semaglutide , 2 MG/DOSE, 8 MG/3ML SOPN Inject 2 mg into the skin once a week. Once weekly   sertraline  (ZOLOFT ) 100 MG tablet Take 1 tablet (100 mg total) by mouth daily.   spironolactone  (ALDACTONE ) 25 MG tablet Take 1 tablet (25 mg total) by mouth daily.   Teduglutide , rDNA, 5 MG KIT Inject 5 mg into the skin daily. Gattex  (Patient not taking: Reported on 01/08/2024)   No facility-administered encounter medications on file as of 01/15/2024.    Allergies (verified) Cephalexin, Latex, Neomycin-bacitracin zn-polymyx, and Tape   History: Past Medical History:  Diagnosis Date   Advanced directives, counseling/discussion 04/12/2020   Allergy    Anemia    ANEMIA, PERNICIOUS, HX OF 05/13/2007   Anxiety    Arthritis    ASYMPTOMATIC POSTMENOPAUSAL STATUS 02/18/2008   B12 deficiency 12/13/2016   BACK PAIN, LUMBAR 11/19/2007   Blood in urine 07/19/2019   Bowel incontinence    Cataract    Cellulitis of left lower leg 10/28/2013   Cellulitis of leg, right 10/11/2010   Chronic depression 10/06/2007   Chronic dermatitis of hands 05/02/2009   Coronary artery disease involving native coronary artery of native heart without angina pectoris 07/07/2018   Last Assessment & Plan:  Formatting of this note might be different from the original. The patient reports remote cardiac catheterization with up to 40% plaque.  Subsequent Cardiolite  stress test in October 2019 did not show any evidence of active ischemia or prior infarction. Formatting of this note might be different from the original. medical  Last Assessment & Plan:  Formatting of this note mi   DEPRESSION, CHRONIC 10/06/2007   Diabetes mellitus without complication (HCC)    DIABETES MELLITUS, WITH NEUROLOGICAL COMPLICATIONS 02/18/2008   Diabetic neuropathy (HCC)    DYSLIPIDEMIA 05/13/2007   Dyslipidemia 04/15/2017   Last Assessment & Plan:  The patient will continue  atorvastatin  for her dyslipidemia.   Dyslipidemia associated with type 2 diabetes mellitus (HCC) 07/11/2018   Essential hypertension 03/20/2010   Family history of colon cancer    Fecal incontinence 08/02/2015   Frozen shoulder    Lt   Full dentures    Gastroesophageal reflux disease without esophagitis 04/15/2017   Generalized abdominal pain 02/05/2018   Genetic testing 11/24/2017   Negative genetic testing on the common hereditary cancer panel.  The Hereditary Gene Panel offered by [facility] includes sequencing and/or deletion duplication testing of the following 47 genes: APC, ATM, AXIN2, BARD1, BMPR1A, BRCA1, BRCA2, BRIP1, CDH1, CDK4, CDKN2A (p14ARF), CDKN2A (p16INK4a), CHEK2, CTNNA1, DICER1, EPCAM (Deletion/duplication testing only), GREM1 (promoter region deletion/duplicat   GERD (gastroesophageal reflux disease)    GI bleed 03/15/2016   GOITER, MULTINODULAR 05/13/2007   GOITER, MULTINODULAR 05/13/2007    (last TSH 3.26; US  soft tissue neck in 09/2004 showed multinodular goiter with specific small nodule for which was recommended to be followed by repeat US  in 3-6 months 12/2016: Thyroid  US : recommend repeat in 1 year (one nodule 1.7cm on left thyroid )    Hallux rigidus  of left foot 04/12/2020   Headache    History of blood transfusion    Hx of colonic polyps 12/04/2010   HYPERCHOLESTEROLEMIA 03/20/2010   Hyperparathyroidism (HCC) 04/11/2020   HYPERTENSION 03/20/2010   Hypertension associated with diabetes (HCC) 07/11/2018   Iron deficiency anemia 06/19/2012   Low back pain 03/05/2012   Completed PT-notes say very motivated and had good progress.   CT (03/2020: Mild curvature convex to the right in the upper lumbar region into left in lower lumbar region. No antero or retrolisthesis in the supine position. retrolisthesis at L3-4 of 3 mm with flexion which increases to 6 mm with neutral and extension. Multifactorial spinal stenosis at L3-4 due to circumferential protrusion of the     Lower back pain    Major depression in remission (HCC) 07/07/2018   Last Assessment & Plan:  Mood has seemed good on sertraline .   Malabsorption 03/12/2016   NASH (nonalcoholic steatohepatitis)    Nausea 07/19/2019   Need for immunization against influenza 04/12/2020   No-show for appointment 03/08/2020   Nonobstructive CAD  08/26/2013   The patient has prior cardiac workup in 2006 when she underwent a stress test for abnormal EKG. The stress test was nonconclusive and she underwent cardiac catheterization that showed 30% stenosis in the LAD in 25% stenosis in RCA she was treated medically.     OA (osteoarthritis)    OBSTRUCTIVE SLEEP APNEA 06/12/2007   uses CPAP.   Pacemaker    Paroxysmal atrial fibrillation (HCC) 05/11/2018   Last Assessment & Plan:  Patient was noted to have episodes of paroxysmal atrial fibrillation that was predominantly rate controlled and at a relatively low burden.  She is chronically on anticoagulation now with Eliquis .  She does not take any negative chronotropic agents with her sinus bradycardia.  If she has any breakthrough more prolonged tachycardic episodes and requires negative chronotropi   Peripheral autonomic neuropathy due to diabetes mellitus (HCC) 07/11/2018   PERIPHERAL NEUROPATHY 01/12/2009   Peripheral neuropathy 01/12/2009   Peripheral vascular disease (HCC)    Presbycusis of both ears 04/11/2020   Primary osteoarthritis involving multiple joints 07/07/2018   Last Assessment & Plan:  Post op left tka and here for rehab as her husband is handicapped so she has limited help at home. Consitpation noted, some pain overnight, better with a pillow under her knee.   Primary osteoarthritis of left knee 04/23/2011   Previously seen by Dr. Glendia Hutchinson. Will plan on repeat referral after trial injection.      PSORIASIS 05/28/2010   Rectal prolapse 05/24/2015   Rectovaginal fistula 05/25/2014   Renal insufficiency    stage 1 kd   S/P bariatric  surgery-duodenal switch with sleeve gastrectomy 12/04/2013   S/P total knee arthroplasty, right 11/16/2019   Screening mammogram, encounter for 07/12/2019   Second degree AV block 02/08/2020   Serum calcium  elevated 04/12/2020   Short bowel syndrome 07/11/2018   Spigelian hernia 02/05/2018   Status post left knee replacement 07/11/2018   Stress 05/24/2015   Surgical counseling visit 10/02/2019   Tubular adenoma of colon 03/09/2016   Colonoscopy in 10/2015: 3  tubular adenomas; GI recommends repeat colonoscopy in 3 years Colonoscopy August 2012: 3 tubular adenomas.    Type 2 diabetes, controlled, with neuropathy (HCC) 02/18/2008   Umbilical hernia without obstruction and without gangrene 04/15/2017   Unilateral primary osteoarthritis, left knee    UNSPECIFIED VENOUS INSUFFICIENCY 05/28/2010   Patient with frequent ulceration related to venous stasis-seen at wound  care. Edema and Venous stasis changes due to this . Echo 04/16/11 with EF 60%. No signs diastolic dysfunction.        Vaccination against Streptococcus pneumoniae within past 5 years 04/12/2020   Vaginal irritation 07/19/2019   Venous stasis dermatitis of right lower extremity 01/02/2018   Vitamin D  deficiency 03/09/2016   Wears glasses    Past Surgical History:  Procedure Laterality Date   CARDIAC CATHETERIZATION  2006   CATARACT EXTRACTION W/ INTRAOCULAR LENS  IMPLANT, BILATERAL     COLONOSCOPY     DECOMPRESSIVE LUMBAR LAMINECTOMY LEVEL 2 N/A 11/05/2022   Procedure: L3-4, L4-5 DECOMPRESSIVE LUMBAR LAMINECTOMY LEVEL 2;  Surgeon: Georgina Ozell LABOR, MD;  Location: [facility] OR;  Service: Orthopedics;  Laterality: N/A;   ELECTROCARDIOGRAM  04/16/2006   EXAMINATION UNDER ANESTHESIA N/A 06/29/2014   Procedure: EXAM UNDER ANESTHESIA;  Surgeon: Ezzie Buba, MD;  Location: [facility];  Service: Gynecology;  Laterality: N/A;   Exercise myoview  01/24/2005   FLEXIBLE SIGMOIDOSCOPY     GASTRIC BYPASS  11/09/2013   JOINT REPLACEMENT      KNEE ARTHROSCOPY  2003   right   LAPAROSCOPY N/A 03/12/2016   Procedure: LAPAROSCOPIC ANASTOMOSIS OF INTESTINE (ENTEROENTEROSTOMY);  Surgeon: Ozell LABOR Mano, MD;  Location: [facility] ORS;  Service: General;  Laterality: N/A;   LEG SURGERY Left    metal and pins in lower left leg   mrsa Right    arm   MULTIPLE TOOTH EXTRACTIONS     PACEMAKER LEADLESS INSERTION N/A 02/08/2020   Procedure: PACEMAKER LEADLESS INSERTION;  Surgeon: Kelsie Agent, MD;  Location: [facility];  Service: Cardiovascular;  Laterality: N/A;   PARATHYROIDECTOMY Left 02/19/2021   Procedure: LEFT PARATHYROIDECTOMY WITH FROZEN SECTION;  Surgeon: Jesus Oliphant, MD;  Location: [facility] OR;  Service: ENT;  Laterality: Left;   SHOULDER ARTHROSCOPY W/ ROTATOR CUFF REPAIR Right    stab phlebectomy  Right 02/10/2019   stab phlebectomy > 20 incisions right leg by Carlin Haddock MD    TONSILLECTOMY     TOTAL KNEE ARTHROPLASTY Left 06/30/2018   Procedure: LEFT TOTAL KNEE ARTHROPLASTY;  Surgeon: Addie Cordella Hamilton, MD;  Location: [facility] OR;  Service: Orthopedics;  Laterality: Left;   TOTAL KNEE ARTHROPLASTY Right 11/16/2019   Procedure: RIGHT TOTAL KNEE ARTHROPLASTY-CEMENTED;  Surgeon: Addie Cordella Hamilton, MD;  Location: [facility] OR;  Service: Orthopedics;  Laterality: Right;   VARICOSE VEIN SURGERY     Remotef   Family History  Problem Relation Age of Onset   Hyperlipidemia Father    Hypertension Father    Cirrhosis Father    Colon cancer Father 92       d. 27   Colon cancer Sister 60       d. 70   Bipolar disorder Sister    Aneurysm Mother        brain   Cerebral aneurysm Mother    Colon cancer Sister 47       d. 52   Bipolar disorder Sister    Cancer Paternal Aunt        NOS, ? colon   Congestive Heart Failure Maternal Grandmother    Drug abuse Neg Hx    CAD Neg Hx    Stomach cancer Neg Hx    Hyperparathyroidism Neg Hx    Hypercalcemia Neg Hx    Social History   Socioeconomic History   Marital status: Married     Spouse name: Cathlyn   Number of children: 2   Years of  education: 12   Highest education level: High school graduate  Occupational History   Occupation: Unemployed    Comment: disabled  Tobacco Use   Smoking status: Former    Current packs/day: 0.00    Average packs/day: 2.0 packs/day for 24.1 years (48.2 ttl pk-yrs)    Types: Cigarettes    Start date: 06/24/1970    Quit date: 08/06/1994    Years since quitting: 29.4   Smokeless tobacco: Never  Vaping Use   Vaping status: Never Used  Substance and Sexual Activity   Alcohol use: No    Alcohol/week: 0.0 standard drinks of alcohol   Drug use: No   Sexual activity: Not Currently  Other Topics Concern   Not on file  Social History Narrative   Patient lives with husband in [place]. Cathlyn is a [facility] patient.    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    Daughter lives in nearby and is a great help.   Son lives in [place] .    2 dogs and 1 cat.   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   Social Drivers of Corporate investment banker Strain: Low Risk  (01/15/2024)   Overall Financial Resource Strain (CARDIA)    Difficulty of Paying Living Expenses: Not hard at all  Food Insecurity: No Food Insecurity (01/15/2024)   Hunger Vital Sign    Worried About Running Out of Food in the Last Year: Never true    Ran Out of Food in the Last Year: Never true  Transportation Needs: No Transportation Needs (01/15/2024)   PRAPARE - Administrator, Civil Service (Medical): No    Lack of Transportation (Non-Medical): No  Physical Activity: Inactive (01/15/2024)   Exercise Vital Sign    Days of Exercise per Week: 0 days    Minutes of Exercise per Session: 0 min  Stress: No Stress Concern Present (01/15/2024)   [employer] of Occupational Health - Occupational Stress Questionnaire    Feeling of Stress: Not at all  Social Connections: Moderately Isolated (01/15/2024)   Social Connection and Isolation Panel     Frequency of Communication with Friends and Family: More than three times a week    Frequency of Social Gatherings with Friends and Family: Three times a week    Attends Religious Services: Never    Active Member of Clubs or Organizations: No    Attends Banker Meetings: Never    Marital Status: Married    Tobacco Counseling Counseling given: Not Answered    Clinical Intake:  Pre-visit preparation completed: Yes  Pain : No/denies pain Pain Score: 0-No pain     BMI - recorded: 24.33 Nutritional Status: BMI of 19-24  Normal Nutritional Risks: None Diabetes: Yes CBG done?: No Did pt. bring in CBG monitor from home?: No  Lab Results  Component Value Date   HGBA1C 6.1 08/28/2023   HGBA1C 5.7 05/09/2023   HGBA1C 5.4 02/19/2023     How often do you need to have someone help you when you read instructions, pamphlets, or other written materials from your doctor or pharmacy?: 1 - Never  Interpreter Needed?: No  Information entered by :: Marquan Vokes N. Solimar Maiden, LPN.   Activities of Daily Living     01/15/2024    8:40 AM  In your present state of health, do you have any difficulty performing the following activities:  Hearing? 1  Vision? 1  Difficulty concentrating or making decisions? 1  Walking or  climbing stairs? 1  Dressing or bathing? 0  Doing errands, shopping? 1  Preparing Food and eating ? N  Using the Toilet? N  In the past six months, have you accidently leaked urine? N  Do you have problems with loss of bowel control? Y  Managing your Medications? N  Managing your Finances? N  Housekeeping or managing your Housekeeping? N    Patient Care Team: Lennie Raguel MATSU, DO as PCP - General (Family Medicine) Cindie Ole DASEN, MD as PCP - Electrophysiology (Cardiology) Wonda Cy BROCKS, RD as Dietitian (Family Medicine) Glendia Simmonds, OD as Referring Physician (Optometry) Thedora Ozell LABOR, MD as Attending Physician (Bariatrics) Addie, Cordella Glendia, MD as  Consulting Physician (Orthopedic Surgery) Jacobo Evalene PARAS, MD as Consulting Physician (Oncology) Glendia Simmonds, OD as Referring Physician (Optometry) Jarold Mayo, MD as Consulting Physician (Ophthalmology)  I have updated your Care Teams any recent Medical Services you may have received from other providers in the past year.     Assessment:   This is a routine wellness examination for Bobbie.  Hearing/Vision screen Hearing Screening - Comments:: Patient has hearing difficulties. Patient has been referred to ENT. Vision Screening - Comments:: Wears rx glasses - up to date with routine eye exams with Dr. Mayo Jarold and Simmonds Glendia, OD.    Goals Addressed             This Visit's Progress    01/15/24: I want to get going again by walking and using my walking sticks.         Depression Screen     01/15/2024    8:41 AM 12/15/2023    9:37 AM 11/13/2023   10:12 AM 08/28/2023   10:40 AM 08/22/2023    4:18 PM 05/27/2023   10:04 AM 05/09/2023    9:49 AM  PHQ 2/9 Scores  PHQ - 2 Score 0    0 0 0  PHQ- 9 Score 1    4 1    Exception Documentation  Patient refusal Patient refusal Patient refusal       Fall Risk     01/15/2024    8:39 AM 12/15/2023    9:37 AM 11/13/2023   10:12 AM 08/28/2023   10:41 AM 08/22/2023    4:16 PM  Fall Risk   Falls in the past year? 1 1 1  0 0  Number falls in past yr: 1 0 1 0 0  Injury with Fall? 1 0 1 0 0  Risk for fall due to : History of fall(s);Impaired balance/gait;Orthopedic patient      Follow up Falls evaluation completed;Education provided        MEDICARE RISK AT HOME:  Medicare Risk at Home Any stairs in or around the home?: No If so, are there any without handrails?: No Home free of loose throw rugs in walkways, pet beds, electrical cords, etc?: Yes Adequate lighting in your home to reduce risk of falls?: Yes Life alert?: No Use of a cane, walker or w/c?: Yes (CANE, WALKER) Grab bars in the bathroom?: Yes Shower chair or bench in  shower?: Yes Elevated toilet seat or a handicapped toilet?: Yes  TIMED UP AND GO:  Was the test performed?  No  Cognitive Function: Impaired: Patient has current diagnosis of cognitive impairment.    01/15/2024    8:41 AM 08/19/2023    8:39 AM 02/10/2023    2:11 PM  MMSE - Mini Mental State Exam  Not completed: Unable to complete    Orientation  to time  5 3  Orientation to Place  4 5  Registration  2 3  Attention/ Calculation  3 5  Recall  2 2  Language- name 2 objects  2 2  Language- repeat  1 0  Language- follow 3 step command  3 3  Language- read & follow direction  1 1  Write a sentence  1 1  Copy design  1 1  Total score  25 26      02/10/2023    2:17 PM 09/11/2020    1:24 PM  Montreal Cognitive Assessment   Visuospatial/ Executive (0/5) 2 5  Naming (0/3) 2 2  Attention: Read list of digits (0/2) 1 2  Attention: Read list of letters (0/1) 0 1  Attention: Serial 7 subtraction starting at 100 (0/3) 3 3  Language: Repeat phrase (0/2) 1 1  Language : Fluency (0/1) 0 1  Abstraction (0/2) 2 1  Delayed Recall (0/5) 3 4  Orientation (0/6) 4 6  Total 18 26  Adjusted Score (based on education)  27      01/10/2023    2:01 PM 06/12/2021   10:00 AM 11/10/2018   10:11 AM  6CIT Screen  What Year? 0 points 0 points 0 points  What month? 0 points 0 points 0 points  What time? 0 points 0 points 0 points  Count back from 20 0 points 0 points 0 points  Months in reverse 2 points 0 points 0 points  Repeat phrase 2 points 0 points 0 points  Total Score 4 points 0 points 0 points    Immunizations Immunization History  Administered Date(s) Administered   Fluad Quad(high Dose 65+) 03/16/2021, 04/17/2022   Fluad Trivalent(High Dose 65+) 04/28/2023   Influenza Split 04/23/2011, 04/03/2012   Influenza,inj,Quad PF,6+ Mos 03/01/2013, 04/14/2014, 07/25/2015, 02/20/2016, 02/25/2017, 03/04/2018, 02/19/2019, 04/04/2020   Moderna Sars-Covid-2 Vaccination 09/04/2019, 10/06/2019,  04/08/2020   PFIZER Comirnaty(Gray Top)Covid-19 Tri-Sucrose Vaccine 11/16/2020   PNEUMOCOCCAL CONJUGATE-20 06/01/2021   Pfizer(Comirnaty)Fall Seasonal Vaccine 12 years and older 06/28/2022, 04/28/2023   Pneumococcal Conjugate-13 04/03/2020   Pneumococcal Polysaccharide-23 07/12/2010, 11/17/2015   Td 01/18/2005   Tdap 09/17/2011   Zoster Recombinant(Shingrix) 11/12/2021    Screening Tests Health Maintenance  Topic Date Due   DTaP/Tdap/Td (3 - Td or Tdap) 09/16/2021   Zoster Vaccines- Shingrix (2 of 2) 01/07/2022   MAMMOGRAM  10/12/2022   OPHTHALMOLOGY EXAM  01/29/2023   FOOT EXAM  06/29/2023   COVID-19 Vaccine (7 - Moderna risk 2024-25 season) 10/26/2023   Colonoscopy  12/17/2023   INFLUENZA VACCINE  01/23/2024   HEMOGLOBIN A1C  02/28/2024   Diabetic kidney evaluation - Urine ACR  04/27/2024   Diabetic kidney evaluation - eGFR measurement  12/22/2024   Medicare Annual Wellness (AWV)  01/14/2025   Pneumococcal Vaccine: 50+ Years  Completed   DEXA SCAN  Completed   Hepatitis C Screening  Completed   Hepatitis B Vaccines  Aged Out   HPV VACCINES  Aged Out   Meningococcal B Vaccine  Aged Out    Health Maintenance  Health Maintenance Due  Topic Date Due   DTaP/Tdap/Td (3 - Td or Tdap) 09/16/2021   Zoster Vaccines- Shingrix (2 of 2) 01/07/2022   MAMMOGRAM  10/12/2022   OPHTHALMOLOGY EXAM  01/29/2023   FOOT EXAM  06/29/2023   COVID-19 Vaccine (7 - Moderna risk 2024-25 season) 10/26/2023   Colonoscopy  12/17/2023   Health Maintenance Items Addressed: Yes Patient aware of current care gaps.  Immunization record  was verified by [facility].    Additional Screening:  Vision Screening: Recommended annual ophthalmology exams for early detection of glaucoma and other disorders of the eye. Would you like a referral to an eye doctor? Yes    Dental Screening: Recommended annual dental exams for proper oral hygiene  Community Resource Referral / Chronic Care Management: CRR required  this visit?  No   CCM required this visit?  No   Plan:    I have personally reviewed and noted the following in the patient's chart:   Medical and social history Use of alcohol, tobacco or illicit drugs  Current medications and supplements including opioid prescriptions. Patient is not currently taking opioid prescriptions. Functional ability and status Nutritional status Physical activity Advanced directives List of other physicians Hospitalizations, surgeries, and ER visits in previous 12 months Vitals Screenings to include cognitive, depression, and falls Referrals and appointments  In addition, I have reviewed and discussed with patient certain preventive protocols, quality metrics, and best practice recommendations. A written personalized care plan for preventive services as well as general preventive health recommendations were provided to patient.   Roz LOISE Fuller, LPN   [id number]   After Visit Summary: (MyChart) Due to this being a telephonic visit, the after visit summary with patients personalized plan was offered to patient via MyChart   Notes: Patient aware of current care gaps.  Immunization record was verified by [employer].

## 2024-01-15 NOTE — Telephone Encounter (Signed)
 Patient had AWV.  Last seen in July and was recommended to follow up and bring all medications with her in August. Please call patient and help her schedule visit with Dr. Lennie Suzann Daring, MD  [facility] Teaching Service

## 2024-01-16 ENCOUNTER — Encounter (HOSPITAL_BASED_OUTPATIENT_CLINIC_OR_DEPARTMENT_OTHER): Admitting: General Surgery

## 2024-01-16 DIAGNOSIS — R600 Localized edema: Secondary | ICD-10-CM | POA: Diagnosis not present

## 2024-01-16 DIAGNOSIS — S51812A Laceration without foreign body of left forearm, initial encounter: Secondary | ICD-10-CM | POA: Diagnosis not present

## 2024-01-16 DIAGNOSIS — L98492 Non-pressure chronic ulcer of skin of other sites with fat layer exposed: Secondary | ICD-10-CM | POA: Diagnosis not present

## 2024-01-16 DIAGNOSIS — L97815 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 without evidence of necrosis: Secondary | ICD-10-CM | POA: Diagnosis not present

## 2024-01-16 DIAGNOSIS — E11621 Type 2 diabetes mellitus with foot ulcer: Secondary | ICD-10-CM | POA: Diagnosis not present

## 2024-01-16 DIAGNOSIS — E11622 Type 2 diabetes mellitus with other skin ulcer: Secondary | ICD-10-CM | POA: Diagnosis not present

## 2024-01-16 DIAGNOSIS — L97422 Non-pressure chronic ulcer of left heel and midfoot with fat layer exposed: Secondary | ICD-10-CM | POA: Diagnosis not present

## 2024-01-23 ENCOUNTER — Encounter (HOSPITAL_BASED_OUTPATIENT_CLINIC_OR_DEPARTMENT_OTHER): Attending: General Surgery | Admitting: General Surgery

## 2024-01-23 ENCOUNTER — Ambulatory Visit: Admitting: Podiatry

## 2024-01-23 DIAGNOSIS — R600 Localized edema: Secondary | ICD-10-CM | POA: Insufficient documentation

## 2024-01-23 DIAGNOSIS — L97421 Non-pressure chronic ulcer of left heel and midfoot limited to breakdown of skin: Secondary | ICD-10-CM | POA: Diagnosis not present

## 2024-01-23 DIAGNOSIS — C44722 Squamous cell carcinoma of skin of right lower limb, including hip: Secondary | ICD-10-CM | POA: Insufficient documentation

## 2024-01-23 DIAGNOSIS — E11622 Type 2 diabetes mellitus with other skin ulcer: Secondary | ICD-10-CM | POA: Diagnosis not present

## 2024-01-23 DIAGNOSIS — E11621 Type 2 diabetes mellitus with foot ulcer: Secondary | ICD-10-CM | POA: Diagnosis not present

## 2024-01-23 DIAGNOSIS — L97815 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 without evidence of necrosis: Secondary | ICD-10-CM | POA: Diagnosis not present

## 2024-01-23 DIAGNOSIS — L97422 Non-pressure chronic ulcer of left heel and midfoot with fat layer exposed: Secondary | ICD-10-CM | POA: Diagnosis not present

## 2024-01-23 DIAGNOSIS — L98492 Non-pressure chronic ulcer of skin of other sites with fat layer exposed: Secondary | ICD-10-CM | POA: Diagnosis not present

## 2024-01-23 DIAGNOSIS — L97812 Non-pressure chronic ulcer of other part of right lower leg with fat layer exposed: Secondary | ICD-10-CM | POA: Diagnosis not present

## 2024-01-23 DIAGNOSIS — I872 Venous insufficiency (chronic) (peripheral): Secondary | ICD-10-CM | POA: Diagnosis not present

## 2024-01-23 NOTE — Progress Notes (Signed)
 Subjective:  Patient ID: [name], female    DOB: [birth year],  MRN: [id number]  Chief Complaint  Patient presents with   Benign skin lesion    Benign skin lesion    68 y.o. female presents with the above complaint.  Patient presents with complaint left heel superficial wound.  She states that she is being seen at the wound care center for the wound management she just wanted get it evaluated.  She originally had a plantar verruca underwent Cantharone treatment for benign skin lesion.  Leading to ulceration wanted to get it evaluated pain scale is 5 out of 10 dull aching nature.   Review of Systems: Negative except as noted in the HPI. Denies N/V/F/Ch.  Past Medical History:  Diagnosis Date   Advanced directives, counseling/discussion 04/12/2020   Allergy    Anemia    ANEMIA, PERNICIOUS, HX OF 05/13/2007   Anxiety    Arthritis    ASYMPTOMATIC POSTMENOPAUSAL STATUS 02/18/2008   B12 deficiency 12/13/2016   BACK PAIN, LUMBAR 11/19/2007   Blood in urine 07/19/2019   Bowel incontinence    Cataract    Cellulitis of left lower leg 10/28/2013   Cellulitis of leg, right 10/11/2010   Chronic depression 10/06/2007   Chronic dermatitis of hands 05/02/2009   Coronary artery disease involving native coronary artery of native heart without angina pectoris 07/07/2018   Last Assessment & Plan:  Formatting of this note might be different from the original. The patient reports remote cardiac catheterization with up to 40% plaque.  Subsequent Cardiolite  stress test in October 2019 did not show any evidence of active ischemia or prior infarction. Formatting of this note might be different from the original. medical  Last Assessment & Plan:  Formatting of this note mi   DEPRESSION, CHRONIC 10/06/2007   Diabetes mellitus without complication (HCC)    DIABETES MELLITUS, WITH NEUROLOGICAL COMPLICATIONS 02/18/2008   Diabetic neuropathy (HCC)    DYSLIPIDEMIA 05/13/2007   Dyslipidemia 04/15/2017    Last Assessment & Plan:  The patient will continue atorvastatin  for her dyslipidemia.   Dyslipidemia associated with type 2 diabetes mellitus (HCC) 07/11/2018   Essential hypertension 03/20/2010   Family history of colon cancer    Fecal incontinence 08/02/2015   Frozen shoulder    Lt   Full dentures    Gastroesophageal reflux disease without esophagitis 04/15/2017   Generalized abdominal pain 02/05/2018   Genetic testing 11/24/2017   Negative genetic testing on the common hereditary cancer panel.  The Hereditary Gene Panel offered by [facility] includes sequencing and/or deletion duplication testing of the following 47 genes: APC, ATM, AXIN2, BARD1, BMPR1A, BRCA1, BRCA2, BRIP1, CDH1, CDK4, CDKN2A (p14ARF), CDKN2A (p16INK4a), CHEK2, CTNNA1, DICER1, EPCAM (Deletion/duplication testing only), GREM1 (promoter region deletion/duplicat   GERD (gastroesophageal reflux disease)    GI bleed 03/15/2016   GOITER, MULTINODULAR 05/13/2007   GOITER, MULTINODULAR 05/13/2007    (last TSH 3.26; US  soft tissue neck in 09/2004 showed multinodular goiter with specific small nodule for which was recommended to be followed by repeat US  in 3-6 months 12/2016: Thyroid  US : recommend repeat in 1 year (one nodule 1.7cm on left thyroid )    Hallux rigidus of left foot 04/12/2020   Headache    History of blood transfusion    Hx of colonic polyps 12/04/2010   HYPERCHOLESTEROLEMIA 03/20/2010   Hyperparathyroidism (HCC) 04/11/2020   HYPERTENSION 03/20/2010   Hypertension associated with diabetes (HCC) 07/11/2018   Iron deficiency anemia 06/19/2012   Low back pain 03/05/2012  Completed PT-notes say very motivated and had good progress.   CT (03/2020: Mild curvature convex to the right in the upper lumbar region into left in lower lumbar region. No antero or retrolisthesis in the supine position. retrolisthesis at L3-4 of 3 mm with flexion which increases to 6 mm with neutral and extension. Multifactorial spinal stenosis at  L3-4 due to circumferential protrusion of the    Lower back pain    Major depression in remission (HCC) 07/07/2018   Last Assessment & Plan:  Mood has seemed good on sertraline .   Malabsorption 03/12/2016   NASH (nonalcoholic steatohepatitis)    Nausea 07/19/2019   Need for immunization against influenza 04/12/2020   No-show for appointment 03/08/2020   Nonobstructive CAD  08/26/2013   The patient has prior cardiac workup in 2006 when she underwent a stress test for abnormal EKG. The stress test was nonconclusive and she underwent cardiac catheterization that showed 30% stenosis in the LAD in 25% stenosis in RCA she was treated medically.     OA (osteoarthritis)    OBSTRUCTIVE SLEEP APNEA 06/12/2007   uses CPAP.   Pacemaker    Paroxysmal atrial fibrillation (HCC) 05/11/2018   Last Assessment & Plan:  Patient was noted to have episodes of paroxysmal atrial fibrillation that was predominantly rate controlled and at a relatively low burden.  She is chronically on anticoagulation now with Eliquis .  She does not take any negative chronotropic agents with her sinus bradycardia.  If she has any breakthrough more prolonged tachycardic episodes and requires negative chronotropi   Peripheral autonomic neuropathy due to diabetes mellitus (HCC) 07/11/2018   PERIPHERAL NEUROPATHY 01/12/2009   Peripheral neuropathy 01/12/2009   Peripheral vascular disease (HCC)    Presbycusis of both ears 04/11/2020   Primary osteoarthritis involving multiple joints 07/07/2018   Last Assessment & Plan:  Post op left tka and here for rehab as her husband is handicapped so she has limited help at home. Consitpation noted, some pain overnight, better with a pillow under her knee.   Primary osteoarthritis of left knee 04/23/2011   Previously seen by Dr. Glendia Hutchinson. Will plan on repeat referral after trial injection.      PSORIASIS 05/28/2010   Rectal prolapse 05/24/2015   Rectovaginal fistula 05/25/2014   Renal  insufficiency    stage 1 kd   S/P bariatric surgery-duodenal switch with sleeve gastrectomy 12/04/2013   S/P total knee arthroplasty, right 11/16/2019   Screening mammogram, encounter for 07/12/2019   Second degree AV block 02/08/2020   Serum calcium  elevated 04/12/2020   Short bowel syndrome 07/11/2018   Spigelian hernia 02/05/2018   Status post left knee replacement 07/11/2018   Stress 05/24/2015   Surgical counseling visit 10/02/2019   Tubular adenoma of colon 03/09/2016   Colonoscopy in 10/2015: 3  tubular adenomas; GI recommends repeat colonoscopy in 3 years Colonoscopy August 2012: 3 tubular adenomas.    Type 2 diabetes, controlled, with neuropathy (HCC) 02/18/2008   Umbilical hernia without obstruction and without gangrene 04/15/2017   Unilateral primary osteoarthritis, left knee    UNSPECIFIED VENOUS INSUFFICIENCY 05/28/2010   Patient with frequent ulceration related to venous stasis-seen at wound care. Edema and Venous stasis changes due to this . Echo 04/16/11 with EF 60%. No signs diastolic dysfunction.        Vaccination against Streptococcus pneumoniae within past 5 years 04/12/2020   Vaginal irritation 07/19/2019   Venous stasis dermatitis of right lower extremity 01/02/2018   Vitamin D  deficiency 03/09/2016  Wears glasses     Current Outpatient Medications:    Accu-Chek Softclix Lancets lancets, USE TO CHECK BLOOD SUGAR ONCE DAILY, Disp: 100 each, Rfl: 3   atorvastatin  (LIPITOR ) 40 MG tablet, TAKE 1 TABLET BY MOUTH EVERY DAY, Disp: 90 tablet, Rfl: 3   blood glucose meter kit and supplies KIT, Dispense based on patient and insurance preference. Use up to four times daily as directed., Disp: 1 each, Rfl: 0   Blood Glucose Monitoring Suppl (ACCU-CHEK GUIDE) w/Device KIT, Check blood sugar once daily, Disp: 1 kit, Rfl: 0   buPROPion  (WELLBUTRIN  XL) 150 MG 24 hr tablet, Take 1 tablet (150 mg total) by mouth daily., Disp: 90 tablet, Rfl: 0   ELIQUIS  5 MG TABS tablet, TAKE 1  TABLET BY MOUTH TWICE A DAY, Disp: 180 tablet, Rfl: 1   gabapentin  (NEURONTIN ) 300 MG capsule, Take 1 capsule (300 mg total) by mouth 2 (two) times daily., Disp: 180 capsule, Rfl: 3   glucose blood (ACCU-CHEK GUIDE TEST) test strip, Use as instructed, Disp: 100 each, Rfl: 12   glucose blood (ONETOUCH VERIO) test strip, 1 each by Other route as needed for other. Use as instructed, Disp: 100 each, Rfl: 12   Lancets Misc. (ACCU-CHEK SOFTCLIX LANCET DEV) KIT, Check blood sugar once daily, Disp: 1 kit, Rfl: 0   lisinopril -hydrochlorothiazide  (ZESTORETIC ) 20-25 MG tablet, Take 1 tablet by mouth daily., Disp: 90 tablet, Rfl: 3   Multiple Vitamins-Minerals (MULTIMINERAL PLUS PO), Take 1 tablet by mouth daily., Disp: , Rfl:    Semaglutide , 2 MG/DOSE, 8 MG/3ML SOPN, Inject 2 mg into the skin once a week. Once weekly, Disp: 3 mL, Rfl: 3   sertraline  (ZOLOFT ) 100 MG tablet, Take 1 tablet (100 mg total) by mouth daily., Disp: 90 tablet, Rfl: 1   spironolactone  (ALDACTONE ) 25 MG tablet, Take 1 tablet (25 mg total) by mouth daily., Disp: 90 tablet, Rfl: 1   Teduglutide , rDNA, 5 MG KIT, Inject 5 mg into the skin daily. Gattex  (Patient not taking: Reported on 01/08/2024), Disp: , Rfl:   Social History   Tobacco Use  Smoking Status Former   Current packs/day: 0.00   Average packs/day: 2.0 packs/day for 24.1 years (48.2 ttl pk-yrs)   Types: Cigarettes   Start date: 06/24/1970   Quit date: 08/06/1994   Years since quitting: 29.5  Smokeless Tobacco Never    Allergies  Allergen Reactions   Cephalexin Hives and Rash    Denies Airway involvement   Latex Rash   Neomycin-Bacitracin Zn-Polymyx Rash   Tape Rash    Latex Prefers paper tape   Objective:  There were no vitals filed for this visit. There is no height or weight on file to calculate BMI. Constitutional Well developed. Well nourished.  Vascular Dorsalis pedis pulses palpable bilaterally. Posterior tibial pulses palpable bilaterally. Capillary  refill normal to all digits.  No cyanosis or clubbing noted. Pedal hair growth normal.  Neurologic Normal speech. Oriented to person, place, and time. Epicritic sensation to light touch grossly present bilaterally.  Dermatologic Left heel ulceration superficial.  Does not probe down to deep tissue.  No other clinical signs of infection noted no erythema noted.  No malodor present most drainage noted  Orthopedic: Normal joint ROM without pain or crepitus bilaterally. No visible deformities. No bony tenderness.   Radiographs: None Assessment:   1. Heel ulcer, left, limited to breakdown of skin [facility])    Plan:  Patient was evaluated and treated and all questions answered.  Left heel  superficial wound - All questions and concerns were discussed with the patient in extensive detail given the presence of superficial ulcer at this time she has been primarily managed at the wound care center defer further management to them.  If there is a regression in wound or if there is surgical intervention is needed she will be seen by me.  She states understanding Plan-I discussed with her given the location of the wound if it regresses she is at high risk of undergoing major amputation she states understanding.

## 2024-01-27 ENCOUNTER — Ambulatory Visit: Admitting: Audiologist

## 2024-01-30 ENCOUNTER — Encounter (HOSPITAL_BASED_OUTPATIENT_CLINIC_OR_DEPARTMENT_OTHER): Admitting: General Surgery

## 2024-01-30 DIAGNOSIS — L97422 Non-pressure chronic ulcer of left heel and midfoot with fat layer exposed: Secondary | ICD-10-CM | POA: Diagnosis not present

## 2024-01-30 DIAGNOSIS — E11622 Type 2 diabetes mellitus with other skin ulcer: Secondary | ICD-10-CM | POA: Diagnosis not present

## 2024-01-30 DIAGNOSIS — C44722 Squamous cell carcinoma of skin of right lower limb, including hip: Secondary | ICD-10-CM | POA: Diagnosis not present

## 2024-01-30 DIAGNOSIS — L98492 Non-pressure chronic ulcer of skin of other sites with fat layer exposed: Secondary | ICD-10-CM | POA: Diagnosis not present

## 2024-01-30 DIAGNOSIS — I872 Venous insufficiency (chronic) (peripheral): Secondary | ICD-10-CM | POA: Diagnosis not present

## 2024-01-30 DIAGNOSIS — L97812 Non-pressure chronic ulcer of other part of right lower leg with fat layer exposed: Secondary | ICD-10-CM | POA: Diagnosis not present

## 2024-01-30 DIAGNOSIS — L97815 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 without evidence of necrosis: Secondary | ICD-10-CM | POA: Diagnosis not present

## 2024-01-30 DIAGNOSIS — E11621 Type 2 diabetes mellitus with foot ulcer: Secondary | ICD-10-CM | POA: Diagnosis not present

## 2024-02-04 ENCOUNTER — Telehealth: Payer: Self-pay

## 2024-02-04 NOTE — Telephone Encounter (Signed)
 Patients husband calls nurse line in regards to referral.   Referral information given to [facility] for [facility].

## 2024-02-05 ENCOUNTER — Ambulatory Visit (INDEPENDENT_AMBULATORY_CARE_PROVIDER_SITE_OTHER): Admitting: Neurology

## 2024-02-05 ENCOUNTER — Encounter: Payer: Self-pay | Admitting: Oncology

## 2024-02-05 ENCOUNTER — Ambulatory Visit (INDEPENDENT_AMBULATORY_CARE_PROVIDER_SITE_OTHER): Payer: Medicare Other

## 2024-02-05 VITALS — BP 128/76 | HR 64 | Resp 17 | Ht 68.0 in | Wt 160.1 lb

## 2024-02-05 DIAGNOSIS — I441 Atrioventricular block, second degree: Secondary | ICD-10-CM | POA: Diagnosis not present

## 2024-02-05 DIAGNOSIS — D509 Iron deficiency anemia, unspecified: Secondary | ICD-10-CM

## 2024-02-05 DIAGNOSIS — F03A0 Unspecified dementia, mild, without behavioral disturbance, psychotic disturbance, mood disturbance, and anxiety: Secondary | ICD-10-CM

## 2024-02-05 LAB — CUP PACEART REMOTE DEVICE CHECK
Battery Remaining Longevity: 96 mo
Battery Voltage: 3 V
Brady Statistic RV Percent Paced: 5.51 %
Date Time Interrogation Session: 20250814060800
Implantable Pulse Generator Implant Date: 20210817
Lead Channel Impedance Value: 420 Ohm
Lead Channel Pacing Threshold Amplitude: 0.5 V
Lead Channel Pacing Threshold Pulse Width: 0.4 ms
Lead Channel Sensing Intrinsic Amplitude: 16.65 mV
Lead Channel Setting Pacing Amplitude: 1 V
Lead Channel Setting Pacing Pulse Width: 0.4 ms
Lead Channel Setting Sensing Sensitivity: 2 mV

## 2024-02-05 MED ORDER — MEMANTINE HCL 10 MG PO TABS: 10.0000 mg | ORAL_TABLET | Freq: Two times a day (BID) | ORAL | 11 refills | Status: DC

## 2024-02-05 MED ORDER — DONEPEZIL HCL 10 MG PO TABS: 10.0000 mg | ORAL_TABLET | Freq: Every day | ORAL | 11 refills | Status: AC

## 2024-02-05 NOTE — Progress Notes (Signed)
 Chief Complaint  Patient presents with   Follow-up    Rm12, alone, pt is here to Discuss Treatment Options   Memory Loss    Rm12, alome, mmse score of 25   ASSESSMENT AND PLAN  Cognitive impairment   MMSE today 25/30    Amyloid PET scan was positive, consistent with Alzheimer's pathology,  Discussed with patient and the family, they are concerned the potential side effect of amyloid and removing agent side effect, especially in light of her atrial fibrillation on anticoagulation already, skin cancer, receiving treatment, in addition, there was no APO E genetic typing, we will draw her laboratory today,  At this stage, they prefer to start Aricept , and Namenda  10 mg twice a day,  Also encourage healthy diet, adequate sleep, moderate exercise  Return to clinic  in 9 to 12 months      DIAGNOSTIC DATA (LABS, IMAGING, TESTING) - I reviewed patient records, labs, notes, testing and imaging myself where available.  MRI brain 03/28/2023 IMPRESSION: 1. No acute intracranial abnormality. 2. Mild volume loss without lobar predilection.   MEDICAL HISTORY: [name] is a 68 year old female, seen in request by her primary care physician Dr. Keneth, Krystal BIRCH, for evaluation of memory loss, initial evaluation February 10, 2023  I reviewed and summarized the referring note. PMHX.  HTN DM HLD Depression Pacemaker,  Peripheral vascular disease. Bariatric surgery in May 2015, lost weight from 350  to 169, rapid weight loss History of tobacco abuse  She drove herself to clinic today, lives with her husband who suffered severe MS, disabled, required total care, has 2 children, daughter helps her regularly,  She complains of excessive stress, began to notice gradual onset word finding difficulties since 2023, 1 day she could not even recall her daughter's name, also complains of mild gait difficulty contributed to her bilateral knee pain, also reported 1 episode of sudden onset right arm  weakness, dropped things from her right hand, intermittent bilateral hands tremor,  She had a history of bariatric surgery in 2015, with rapid excessive weight loss,  She is also under the care of of cardiologist, pacemaker implant by Dr. Kelsie in August 2021 for bradycardia, also carries a diagnosis of atrial fibrillation, on Eliquis ,  Personally reviewed CT head in December 2022, no acute abnormality,  She had MRI lumbar spine January 2024, multilevel degenerative changes, severe spinal stenosis at L3-4, moderate bilateral foraminal narrowing  Laboratory evaluations vitamin B-12 163, normal TSH, BMP showed creatinine of 1.39, EGFR of 41, CBC showed hemoglobin of 10.6  UPDATE August 14th 2025: She is accompanied by her daughter, to review the result,  PET brain amyloid scan was positive, moderate to frequent neurotic beta amyloid plaque, demonstrated presence of Alzheimer's pathology  ATN was positive for neurofilament light chain, [id number], there was no APO E genetic typing,  Laboratory evaluation also showed iron deficiency anemia, hemoglobin of 11.1, ferritin level was 9 in December, she had a history of gastric bypass, receiving iron intermittently  Daughter works in Deal[employer], live close by, has been helping her mother, going on healthy diet, better sleep schedule, also supplement, this has made a difference, her brain is more clear,  She is going through right lower extremity skin cancer treatment, left foot callus treatment  History of severe L3-4 stenosis, status post decompression, still have low back pain radiating pain to bilateral lower extremity   PHYSICAL EXAM:   Vitals:   02/05/24 1612  BP: 128/76  Resp: 17  Weight: 160 lb 0.9 oz (72.6 kg)  Height: 5' 8 (1.727 m)   Body mass index is 24.34 kg/m.  PHYSICAL EXAMNIATION:  Gen: NAD, very pleasant Caucasian female, conversant, well nourised, well groomed                     Cardiovascular: Regular rate  rhythm, no peripheral edema, warm, nontender. Eyes: Conjunctivae clear without exudates or hemorrhage Neck: Supple, no carotid bruits. Pulmonary: Clear to auscultation bilaterally   NEUROLOGICAL EXAM:  MENTAL STATUS: Speech/cognition: Awake, alert, oriented to history taking and casual conversation     02/05/2024    4:04 PM 01/15/2024    8:41 AM 08/19/2023    8:39 AM  MMSE - Mini Mental State Exam  Not completed:  Unable to complete   Orientation to time 4  5  Orientation to Place 5  4  Registration 1  2  Attention/ Calculation 5  3  Recall 3  2  Language- name 2 objects 2  2  Language- repeat 0  1  Language- follow 3 step command 2  3  Language- read & follow direction 1  1  Write a sentence 1  1  Copy design 1  1  Total score 25  25    CRANIAL NERVES: CN II: Visual fields are full to confrontation. Pupils are round equal and briskly reactive to light. CN III, IV, VI: extraocular movement are normal. No ptosis. CN V: Facial sensation is intact to light touch CN VII: Face is symmetric with normal eye closure  CN VIII: Mild decreased hearing bilaterally CN IX, X: Phonation is normal. CN XI: Head turning and shoulder shrug are intact  MOTOR: Equal strength in all tested extremities  REFLEXES: Reflexes are1 and symmetric at the biceps, triceps, absent at  knees, and ankles. Plantar responses are flexor.  SENSORY: Length-dependent decreased light touch, vibratory sensation, needle prick,  COORDINATION: There is no trunk or limb dysmetria noted.  GAIT/STANCE: Need push-up to get up from seated position, have left of boot,   REVIEW OF SYSTEMS:  Full 14 system review of systems performed and notable only for as above All other review of systems were negative.   ALLERGIES: Allergies  Allergen Reactions   Cephalexin Hives and Rash    Denies Airway involvement   Latex Rash   Neomycin-Bacitracin Zn-Polymyx Rash   Tape Rash    Latex Prefers paper tape    HOME  MEDICATIONS: Current Outpatient Medications  Medication Sig Dispense Refill   Accu-Chek Softclix Lancets lancets USE TO CHECK BLOOD SUGAR ONCE DAILY 100 each 3   atorvastatin  (LIPITOR ) 40 MG tablet TAKE 1 TABLET BY MOUTH EVERY DAY 90 tablet 3   blood glucose meter kit and supplies KIT Dispense based on patient and insurance preference. Use up to four times daily as directed. 1 each 0   Blood Glucose Monitoring Suppl (ACCU-CHEK GUIDE) w/Device KIT Check blood sugar once daily 1 kit 0   buPROPion  (WELLBUTRIN  XL) 150 MG 24 hr tablet Take 1 tablet (150 mg total) by mouth daily. 90 tablet 0   ELIQUIS  5 MG TABS tablet TAKE 1 TABLET BY MOUTH TWICE A DAY 180 tablet 1   gabapentin  (NEURONTIN ) 300 MG capsule Take 1 capsule (300 mg total) by mouth 2 (two) times daily. 180 capsule 3   glucose blood (ACCU-CHEK GUIDE TEST) test strip Use as instructed 100 each 12   glucose blood (ONETOUCH VERIO) test strip 1 each by Other route as  needed for other. Use as instructed 100 each 12   Lancets Misc. (ACCU-CHEK SOFTCLIX LANCET DEV) KIT Check blood sugar once daily 1 kit 0   lisinopril -hydrochlorothiazide  (ZESTORETIC ) 20-25 MG tablet Take 1 tablet by mouth daily. 90 tablet 3   Multiple Vitamins-Minerals (MULTIMINERAL PLUS PO) Take 1 tablet by mouth daily.     Semaglutide , 2 MG/DOSE, 8 MG/3ML SOPN Inject 2 mg into the skin once a week. Once weekly 3 mL 3   sertraline  (ZOLOFT ) 100 MG tablet Take 1 tablet (100 mg total) by mouth daily. 90 tablet 1   spironolactone  (ALDACTONE ) 25 MG tablet Take 1 tablet (25 mg total) by mouth daily. 90 tablet 1   Teduglutide , rDNA, 5 MG KIT Inject 5 mg into the skin daily. Gattex      No current facility-administered medications for this visit.    PAST MEDICAL HISTORY: Past Medical History:  Diagnosis Date   Advanced directives, counseling/discussion 04/12/2020   Allergy    Anemia    ANEMIA, PERNICIOUS, HX OF 05/13/2007   Anxiety    Arthritis    ASYMPTOMATIC POSTMENOPAUSAL  STATUS 02/18/2008   B12 deficiency 12/13/2016   BACK PAIN, LUMBAR 11/19/2007   Blood in urine 07/19/2019   Bowel incontinence    Cataract    Cellulitis of left lower leg 10/28/2013   Cellulitis of leg, right 10/11/2010   Chronic depression 10/06/2007   Chronic dermatitis of hands 05/02/2009   Coronary artery disease involving native coronary artery of native heart without angina pectoris 07/07/2018   Last Assessment & Plan:  Formatting of this note might be different from the original. The patient reports remote cardiac catheterization with up to 40% plaque.  Subsequent Cardiolite  stress test in October 2019 did not show any evidence of active ischemia or prior infarction. Formatting of this note might be different from the original. medical  Last Assessment & Plan:  Formatting of this note mi   DEPRESSION, CHRONIC 10/06/2007   Diabetes mellitus without complication (HCC)    DIABETES MELLITUS, WITH NEUROLOGICAL COMPLICATIONS 02/18/2008   Diabetic neuropathy (HCC)    DYSLIPIDEMIA 05/13/2007   Dyslipidemia 04/15/2017   Last Assessment & Plan:  The patient will continue atorvastatin  for her dyslipidemia.   Dyslipidemia associated with type 2 diabetes mellitus (HCC) 07/11/2018   Essential hypertension 03/20/2010   Family history of colon cancer    Fecal incontinence 08/02/2015   Frozen shoulder    Lt   Full dentures    Gastroesophageal reflux disease without esophagitis 04/15/2017   Generalized abdominal pain 02/05/2018   Genetic testing 11/24/2017   Negative genetic testing on the common hereditary cancer panel.  The Hereditary Gene Panel offered by [facility] includes sequencing and/or deletion duplication testing of the following 47 genes: APC, ATM, AXIN2, BARD1, BMPR1A, BRCA1, BRCA2, BRIP1, CDH1, CDK4, CDKN2A (p14ARF), CDKN2A (p16INK4a), CHEK2, CTNNA1, DICER1, EPCAM (Deletion/duplication testing only), GREM1 (promoter region deletion/duplicat   GERD (gastroesophageal reflux disease)    GI  bleed 03/15/2016   GOITER, MULTINODULAR 05/13/2007   GOITER, MULTINODULAR 05/13/2007    (last TSH 3.26; US  soft tissue neck in 09/2004 showed multinodular goiter with specific small nodule for which was recommended to be followed by repeat US  in 3-6 months 12/2016: Thyroid  US : recommend repeat in 1 year (one nodule 1.7cm on left thyroid )    Hallux rigidus of left foot 04/12/2020   Headache    History of blood transfusion    Hx of colonic polyps 12/04/2010   HYPERCHOLESTEROLEMIA 03/20/2010   Hyperparathyroidism (HCC) 04/11/2020  HYPERTENSION 03/20/2010   Hypertension associated with diabetes (HCC) 07/11/2018   Iron deficiency anemia 06/19/2012   Low back pain 03/05/2012   Completed PT-notes say very motivated and had good progress.   CT (03/2020: Mild curvature convex to the right in the upper lumbar region into left in lower lumbar region. No antero or retrolisthesis in the supine position. retrolisthesis at L3-4 of 3 mm with flexion which increases to 6 mm with neutral and extension. Multifactorial spinal stenosis at L3-4 due to circumferential protrusion of the    Lower back pain    Major depression in remission (HCC) 07/07/2018   Last Assessment & Plan:  Mood has seemed good on sertraline.   Malabsorption 03/12/2016   NASH (nonalcoholic steatohepatitis)    Nausea 07/19/2019   Need for immunization against influenza 04/12/2020   No-show for appointment 03/08/2020   Nonobstructive CAD  08/26/2013   The patient has prior cardiac workup in 2006 when she underwent a stress test for abnormal EKG. The stress test was nonconclusive and she underwent cardiac catheterization that showed 30% stenosis in the LAD in 25% stenosis in RCA she was treated medically.     OA (osteoarthritis)    OBSTRUCTIVE SLEEP APNEA 06/12/2007   uses CPAP.   Pacemaker    Paroxysmal atrial fibrillation (HCC) 05/11/2018   Last Assessment & Plan:  Patient was noted to have episodes of paroxysmal atrial fibrillation that  was predominantly rate controlled and at a relatively low burden.  She is chronically on anticoagulation now with Eliquis.  She does not take any negative chronotropic agents with her sinus bradycardia.  If she has any breakthrough more prolonged tachycardic episodes and requires negative chronotropi   Peripheral autonomic neuropathy due to diabetes mellitus (HCC) 07/11/2018   PERIPHERAL NEUROPATHY 01/12/2009   Peripheral neuropathy 01/12/2009   Peripheral vascular disease (HCC)    Presbycusis of both ears 04/11/2020   Primary osteoarthritis involving multiple joints 07/07/2018   Last Assessment & Plan:  Post op left tka and here for rehab as her husband is handicapped so she has limited help at home. Consitpation noted, some pain overnight, better with a pillow under her knee.   Primary osteoarthritis of left knee 04/23/2011   Previously seen by Dr. Glendia Hutchinson. Will plan on repeat referral after trial injection.      PSORIASIS 05/28/2010   Rectal prolapse 05/24/2015   Rectovaginal fistula 05/25/2014   Renal insufficiency    stage 1 kd   S/P bariatric surgery-duodenal switch with sleeve gastrectomy 12/04/2013   S/P total knee arthroplasty, right 11/16/2019   Screening mammogram, encounter for 07/12/2019   Second degree AV block 02/08/2020   Serum calcium elevated 04/12/2020   Short bowel syndrome 07/11/2018   Spigelian hernia 02/05/2018   Status post left knee replacement 07/11/2018   Stress 05/24/2015   Surgical counseling visit 10/02/2019   Tubular adenoma of colon 03/09/2016   Colonoscopy in 10/2015: 3  tubular adenomas; GI recommends repeat colonoscopy in 3 years Colonoscopy August 2012: 3 tubular adenomas.    Type 2 diabetes, controlled, with neuropathy (HCC) 02/18/2008   Umbilical hernia without obstruction and without gangrene 04/15/2017   Unilateral primary osteoarthritis, left knee    UNSPECIFIED VENOUS INSUFFICIENCY 05/28/2010   Patient with frequent ulceration related to  venous stasis-seen at wound care. Edema and Venous stasis changes due to this . Echo 04/16/11 with EF 60%. No signs diastolic dysfunction.        Vaccination against Streptococcus pneumoniae within past 5  years 04/12/2020   Vaginal irritation 07/19/2019   Venous stasis dermatitis of right lower extremity 01/02/2018   Vitamin D  deficiency 03/09/2016   Wears glasses     PAST SURGICAL HISTORY: Past Surgical History:  Procedure Laterality Date   CARDIAC CATHETERIZATION  2006   CATARACT EXTRACTION W/ INTRAOCULAR LENS  IMPLANT, BILATERAL     COLONOSCOPY     DECOMPRESSIVE LUMBAR LAMINECTOMY LEVEL 2 N/A 11/05/2022   Procedure: L3-4, L4-5 DECOMPRESSIVE LUMBAR LAMINECTOMY LEVEL 2;  Surgeon: Georgina Ozell LABOR, MD;  Location: [facility] OR;  Service: Orthopedics;  Laterality: N/A;   ELECTROCARDIOGRAM  04/16/2006   EXAMINATION UNDER ANESTHESIA N/A 06/29/2014   Procedure: EXAM UNDER ANESTHESIA;  Surgeon: Ezzie Buba, MD;  Location: [facility] ORS;  Service: Gynecology;  Laterality: N/A;   Exercise myoview  01/24/2005   FLEXIBLE SIGMOIDOSCOPY     GASTRIC BYPASS  11/09/2013   JOINT REPLACEMENT     KNEE ARTHROSCOPY  2003   right   LAPAROSCOPY N/A 03/12/2016   Procedure: LAPAROSCOPIC ANASTOMOSIS OF INTESTINE (ENTEROENTEROSTOMY);  Surgeon: Ozell LABOR Mano, MD;  Location: [facility] ORS;  Service: General;  Laterality: N/A;   LEG SURGERY Left    metal and pins in lower left leg   mrsa Right    arm   MULTIPLE TOOTH EXTRACTIONS     PACEMAKER LEADLESS INSERTION N/A 02/08/2020   Procedure: PACEMAKER LEADLESS INSERTION;  Surgeon: Kelsie Agent, MD;  Location: [facility]AS[facility];  Service: Cardiovascular;  Laterality: N/A;   PARATHYROIDECTOMY Left 02/19/2021   Procedure: LEFT PARATHYROIDECTOMY WITH FROZEN SECTION;  Surgeon: Jesus Oliphant, MD;  Location: [facility] OR;  Service: ENT;  Laterality: Left;   SHOULDER ARTHROSCOPY W/ ROTATOR CUFF REPAIR Right    stab phlebectomy  Right 02/10/2019   stab phlebectomy > 20 incisions  right leg by Carlin Haddock MD    TONSILLECTOMY     TOTAL KNEE ARTHROPLASTY Left 06/30/2018   Procedure: LEFT TOTAL KNEE ARTHROPLASTY;  Surgeon: Addie Cordella Hamilton, MD;  Location: [facility] OR;  Service: Orthopedics;  Laterality: Left;   TOTAL KNEE ARTHROPLASTY Right 11/16/2019   Procedure: RIGHT TOTAL KNEE ARTHROPLASTY-CEMENTED;  Surgeon: Addie Cordella Hamilton, MD;  Location: [facility] OR;  Service: Orthopedics;  Laterality: Right;   VARICOSE VEIN SURGERY     Remotef    FAMILY HISTORY: Family History  Problem Relation Age of Onset   Hyperlipidemia Father    Hypertension Father    Cirrhosis Father    Colon cancer Father 16       d. 54   Colon cancer Sister 24       d. 70   Bipolar disorder Sister    Aneurysm Mother        brain   Cerebral aneurysm Mother    Colon cancer Sister 4       d. 24   Bipolar disorder Sister    Cancer Paternal Aunt        NOS, ? colon   Congestive Heart Failure Maternal Grandmother    Drug abuse Neg Hx    CAD Neg Hx    Stomach cancer Neg Hx    Hyperparathyroidism Neg Hx    Hypercalcemia Neg Hx     SOCIAL HISTORY: Social History   Socioeconomic History   Marital status: Married    Spouse name: Cathlyn   Number of children: 2   Years of education: 12   Highest education level: High school graduate  Occupational History   Occupation: Unemployed    Comment: disabled  Tobacco  Use   Smoking status: Former    Current packs/day: 0.00    Average packs/day: 2.0 packs/day for 24.1 years (48.2 ttl pk-yrs)    Types: Cigarettes    Start date: 06/24/1970    Quit date: 08/06/1994    Years since quitting: 29.5   Smokeless tobacco: Never  Vaping Use   Vaping status: Never Used  Substance and Sexual Activity   Alcohol use: No    Alcohol/week: 0.0 standard drinks of alcohol   Drug use: No   Sexual activity: Not Currently  Other Topics Concern   Not on file  Social History Narrative   Patient lives with husband in [place]. Cathlyn is a [facility] patient.    Patient is  primary caregiver to husband with MS, wheelchair bound.    Daughter lives in nearby and is a great help.   Son lives in [place] .    2 dogs and 1 cat.   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   Social Drivers of Corporate investment banker Strain: Low Risk  (01/15/2024)   Overall Financial Resource Strain (CARDIA)    Difficulty of Paying Living Expenses: Not hard at all  Food Insecurity: No Food Insecurity (01/15/2024)   Hunger Vital Sign    Worried About Running Out of Food in the Last Year: Never true    Ran Out of Food in the Last Year: Never true  Transportation Needs: No Transportation Needs (01/15/2024)   PRAPARE - Administrator, Civil Service (Medical): No    Lack of Transportation (Non-Medical): No  Physical Activity: Inactive (01/15/2024)   Exercise Vital Sign    Days of Exercise per Week: 0 days    Minutes of Exercise per Session: 0 min  Stress: No Stress Concern Present (01/15/2024)   [employer] of Occupational Health - Occupational Stress Questionnaire    Feeling of Stress: Not at all  Social Connections: Moderately Isolated (01/15/2024)   Social Connection and Isolation Panel    Frequency of Communication with Friends and Family: More than three times a week    Frequency of Social Gatherings with Friends and Family: Three times a week    Attends Religious Services: Never    Active Member of Clubs or Organizations: No    Attends Banker Meetings: Never    Marital Status: Married  Catering manager Violence: Not At Risk (01/15/2024)   Humiliation, Afraid, Rape, and Kick questionnaire    Fear of Current or Ex-Partner: No    Emotionally Abused: No    Physically Abused: No    Sexually Abused: No    Modena Callander. M.D. Ph.D.

## 2024-02-06 ENCOUNTER — Ambulatory Visit: Payer: Self-pay | Admitting: Cardiology

## 2024-02-06 ENCOUNTER — Encounter (HOSPITAL_BASED_OUTPATIENT_CLINIC_OR_DEPARTMENT_OTHER): Admitting: General Surgery

## 2024-02-06 DIAGNOSIS — C44722 Squamous cell carcinoma of skin of right lower limb, including hip: Secondary | ICD-10-CM | POA: Diagnosis not present

## 2024-02-06 DIAGNOSIS — E11622 Type 2 diabetes mellitus with other skin ulcer: Secondary | ICD-10-CM | POA: Diagnosis not present

## 2024-02-06 DIAGNOSIS — E11621 Type 2 diabetes mellitus with foot ulcer: Secondary | ICD-10-CM | POA: Diagnosis not present

## 2024-02-06 DIAGNOSIS — L97422 Non-pressure chronic ulcer of left heel and midfoot with fat layer exposed: Secondary | ICD-10-CM | POA: Diagnosis not present

## 2024-02-06 DIAGNOSIS — I872 Venous insufficiency (chronic) (peripheral): Secondary | ICD-10-CM | POA: Diagnosis not present

## 2024-02-06 DIAGNOSIS — L97815 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 without evidence of necrosis: Secondary | ICD-10-CM | POA: Diagnosis not present

## 2024-02-06 DIAGNOSIS — L97812 Non-pressure chronic ulcer of other part of right lower leg with fat layer exposed: Secondary | ICD-10-CM | POA: Diagnosis not present

## 2024-02-06 DIAGNOSIS — L98492 Non-pressure chronic ulcer of skin of other sites with fat layer exposed: Secondary | ICD-10-CM | POA: Diagnosis not present

## 2024-02-09 ENCOUNTER — Ambulatory Visit

## 2024-02-09 ENCOUNTER — Telehealth: Payer: Self-pay

## 2024-02-09 NOTE — Telephone Encounter (Signed)
 Request from [facility] [address] For CLOTRIMAZOLE  1% Topical Cream. I dont see this on current medication list. If you want patient to start this medication. Please send to Rx with instructions, dosage, and duration.Nelson Land, CMA

## 2024-02-11 ENCOUNTER — Ambulatory Visit: Attending: Family Medicine | Admitting: Audiologist

## 2024-02-11 DIAGNOSIS — H903 Sensorineural hearing loss, bilateral: Secondary | ICD-10-CM | POA: Insufficient documentation

## 2024-02-11 NOTE — Procedures (Signed)
  Outpatient Audiology and [facility] [address] [place], [place]  [address] [phone]  AUDIOLOGICAL  EVALUATION  NAME: [name]     DOB:   [birth year]      MRN: [id number]                                                                                     DATE: 02/11/2024     REFERENT: Lennie Raguel MATSU, DO STATUS: Outpatient DIAGNOSIS: Sensorineural Hearing Loss    History: Tika was seen for an audiological evaluation due to difficulty hearing on a daily basis. Loa was seen in 2021 by [facility]. She had a mild sloping to moderate sensorineural hearing loss bilaterally. Hearing aids were recommended.  Annalei denies pain, pressure, or tinnitus today.   Evaluation:  Otoscopy showed a clear view of the tympanic membranes, bilaterally Tympanometry results were consistent with normal middle ear function, bilaterally   Audiometric testing was completed using Conventional Audiometry techniques with insert earphones and supraural headphones. Test results are consistent with slight decrease from previous evaluation. Coriann has sloping mild to moderately severe sensorineural hearing loss. Speech Recognition Thresholds were obtained at 35dB HL in the right ear and at 35dB HL in the left ear. Word Recognition Testing was completed at  40dB SL and Margrit scored 100% in each ear.    Results:  The test results were reviewed with Camrin and her daughter. Zarayah needs hearing aids in both ears. She has had a slight decrease in hearing compared to 2021 test. Leeah counseled on results.  Audiogram printed and provided to [employer].    Recommendations: Hearing aids recommended for both ears. Patient given list of local hearing aid providers.  Annual audiometric testing recommended to monitor hearing loss for progression.    38 minutes spent testing and counseling on results.   If you have any questions please feel free to contact me at [phone].  Lauraine Ka Stalnaker  Au.D.  Audiologist   02/11/2024  3:34 PM  Cc: Lennie Raguel MATSU, DO

## 2024-02-13 ENCOUNTER — Encounter (HOSPITAL_BASED_OUTPATIENT_CLINIC_OR_DEPARTMENT_OTHER): Admitting: General Surgery

## 2024-02-13 DIAGNOSIS — L97812 Non-pressure chronic ulcer of other part of right lower leg with fat layer exposed: Secondary | ICD-10-CM | POA: Diagnosis not present

## 2024-02-13 DIAGNOSIS — I872 Venous insufficiency (chronic) (peripheral): Secondary | ICD-10-CM | POA: Diagnosis not present

## 2024-02-13 DIAGNOSIS — E11622 Type 2 diabetes mellitus with other skin ulcer: Secondary | ICD-10-CM | POA: Diagnosis not present

## 2024-02-13 DIAGNOSIS — L97422 Non-pressure chronic ulcer of left heel and midfoot with fat layer exposed: Secondary | ICD-10-CM | POA: Diagnosis not present

## 2024-02-13 DIAGNOSIS — C44722 Squamous cell carcinoma of skin of right lower limb, including hip: Secondary | ICD-10-CM | POA: Diagnosis not present

## 2024-02-13 DIAGNOSIS — E11621 Type 2 diabetes mellitus with foot ulcer: Secondary | ICD-10-CM | POA: Diagnosis not present

## 2024-02-13 DIAGNOSIS — L97815 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 without evidence of necrosis: Secondary | ICD-10-CM | POA: Diagnosis not present

## 2024-02-13 DIAGNOSIS — L98492 Non-pressure chronic ulcer of skin of other sites with fat layer exposed: Secondary | ICD-10-CM | POA: Diagnosis not present

## 2024-02-19 ENCOUNTER — Ambulatory Visit: Payer: Self-pay | Admitting: Neurology

## 2024-02-19 LAB — APOE ALZHEIMER'S RISK

## 2024-02-20 ENCOUNTER — Ambulatory Visit: Payer: Self-pay | Admitting: Family Medicine

## 2024-02-20 ENCOUNTER — Encounter (HOSPITAL_BASED_OUTPATIENT_CLINIC_OR_DEPARTMENT_OTHER): Admitting: General Surgery

## 2024-02-20 DIAGNOSIS — L97421 Non-pressure chronic ulcer of left heel and midfoot limited to breakdown of skin: Secondary | ICD-10-CM | POA: Diagnosis not present

## 2024-02-20 DIAGNOSIS — L97422 Non-pressure chronic ulcer of left heel and midfoot with fat layer exposed: Secondary | ICD-10-CM | POA: Diagnosis not present

## 2024-02-20 DIAGNOSIS — L97815 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 without evidence of necrosis: Secondary | ICD-10-CM | POA: Diagnosis not present

## 2024-02-20 DIAGNOSIS — E11622 Type 2 diabetes mellitus with other skin ulcer: Secondary | ICD-10-CM | POA: Diagnosis not present

## 2024-02-20 DIAGNOSIS — L98492 Non-pressure chronic ulcer of skin of other sites with fat layer exposed: Secondary | ICD-10-CM | POA: Diagnosis not present

## 2024-02-20 DIAGNOSIS — C44722 Squamous cell carcinoma of skin of right lower limb, including hip: Secondary | ICD-10-CM | POA: Diagnosis not present

## 2024-02-20 DIAGNOSIS — I872 Venous insufficiency (chronic) (peripheral): Secondary | ICD-10-CM | POA: Diagnosis not present

## 2024-02-20 DIAGNOSIS — E11621 Type 2 diabetes mellitus with foot ulcer: Secondary | ICD-10-CM | POA: Diagnosis not present

## 2024-02-24 ENCOUNTER — Telehealth: Payer: Self-pay

## 2024-02-24 NOTE — Telephone Encounter (Signed)
 In process of completing [facility] refill form for patients OZEMPIC  2MG  DOSE PENS.  In Dr. Tiajuana box

## 2024-02-27 ENCOUNTER — Encounter (HOSPITAL_BASED_OUTPATIENT_CLINIC_OR_DEPARTMENT_OTHER): Attending: General Surgery | Admitting: General Surgery

## 2024-02-27 DIAGNOSIS — L84 Corns and callosities: Secondary | ICD-10-CM | POA: Insufficient documentation

## 2024-02-27 DIAGNOSIS — I872 Venous insufficiency (chronic) (peripheral): Secondary | ICD-10-CM | POA: Diagnosis not present

## 2024-02-27 DIAGNOSIS — L97815 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 without evidence of necrosis: Secondary | ICD-10-CM | POA: Diagnosis not present

## 2024-02-27 DIAGNOSIS — R600 Localized edema: Secondary | ICD-10-CM | POA: Insufficient documentation

## 2024-02-27 DIAGNOSIS — E11622 Type 2 diabetes mellitus with other skin ulcer: Secondary | ICD-10-CM | POA: Insufficient documentation

## 2024-02-27 DIAGNOSIS — C44722 Squamous cell carcinoma of skin of right lower limb, including hip: Secondary | ICD-10-CM | POA: Diagnosis not present

## 2024-02-28 ENCOUNTER — Other Ambulatory Visit: Payer: Self-pay | Admitting: Neurology

## 2024-03-02 NOTE — Telephone Encounter (Signed)
 Completed form faxed to [facility].

## 2024-03-05 ENCOUNTER — Encounter (HOSPITAL_BASED_OUTPATIENT_CLINIC_OR_DEPARTMENT_OTHER): Admitting: General Surgery

## 2024-03-05 DIAGNOSIS — E11622 Type 2 diabetes mellitus with other skin ulcer: Secondary | ICD-10-CM | POA: Diagnosis not present

## 2024-03-05 DIAGNOSIS — I872 Venous insufficiency (chronic) (peripheral): Secondary | ICD-10-CM | POA: Diagnosis not present

## 2024-03-05 DIAGNOSIS — L84 Corns and callosities: Secondary | ICD-10-CM | POA: Diagnosis not present

## 2024-03-05 DIAGNOSIS — L97815 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 without evidence of necrosis: Secondary | ICD-10-CM | POA: Diagnosis not present

## 2024-03-05 DIAGNOSIS — L97812 Non-pressure chronic ulcer of other part of right lower leg with fat layer exposed: Secondary | ICD-10-CM | POA: Diagnosis not present

## 2024-03-05 DIAGNOSIS — R600 Localized edema: Secondary | ICD-10-CM | POA: Diagnosis not present

## 2024-03-05 DIAGNOSIS — C44722 Squamous cell carcinoma of skin of right lower limb, including hip: Secondary | ICD-10-CM | POA: Diagnosis not present

## 2024-03-12 ENCOUNTER — Encounter (HOSPITAL_BASED_OUTPATIENT_CLINIC_OR_DEPARTMENT_OTHER): Admitting: General Surgery

## 2024-03-12 DIAGNOSIS — E11622 Type 2 diabetes mellitus with other skin ulcer: Secondary | ICD-10-CM | POA: Diagnosis not present

## 2024-03-12 DIAGNOSIS — L84 Corns and callosities: Secondary | ICD-10-CM | POA: Diagnosis not present

## 2024-03-12 DIAGNOSIS — I872 Venous insufficiency (chronic) (peripheral): Secondary | ICD-10-CM | POA: Diagnosis not present

## 2024-03-12 DIAGNOSIS — L97815 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 without evidence of necrosis: Secondary | ICD-10-CM | POA: Diagnosis not present

## 2024-03-12 DIAGNOSIS — L97812 Non-pressure chronic ulcer of other part of right lower leg with fat layer exposed: Secondary | ICD-10-CM | POA: Diagnosis not present

## 2024-03-12 DIAGNOSIS — R600 Localized edema: Secondary | ICD-10-CM | POA: Diagnosis not present

## 2024-03-12 DIAGNOSIS — C44722 Squamous cell carcinoma of skin of right lower limb, including hip: Secondary | ICD-10-CM | POA: Diagnosis not present

## 2024-03-18 NOTE — Progress Notes (Signed)
 Remote PPM Transmission

## 2024-03-19 ENCOUNTER — Encounter (HOSPITAL_BASED_OUTPATIENT_CLINIC_OR_DEPARTMENT_OTHER): Admitting: General Surgery

## 2024-03-19 DIAGNOSIS — L97815 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 without evidence of necrosis: Secondary | ICD-10-CM | POA: Diagnosis not present

## 2024-03-19 DIAGNOSIS — I872 Venous insufficiency (chronic) (peripheral): Secondary | ICD-10-CM | POA: Diagnosis not present

## 2024-03-19 DIAGNOSIS — C44722 Squamous cell carcinoma of skin of right lower limb, including hip: Secondary | ICD-10-CM | POA: Diagnosis not present

## 2024-03-19 DIAGNOSIS — R600 Localized edema: Secondary | ICD-10-CM | POA: Diagnosis not present

## 2024-03-19 DIAGNOSIS — L84 Corns and callosities: Secondary | ICD-10-CM | POA: Diagnosis not present

## 2024-03-19 DIAGNOSIS — L97812 Non-pressure chronic ulcer of other part of right lower leg with fat layer exposed: Secondary | ICD-10-CM | POA: Diagnosis not present

## 2024-03-19 DIAGNOSIS — E11622 Type 2 diabetes mellitus with other skin ulcer: Secondary | ICD-10-CM | POA: Diagnosis not present

## 2024-03-23 ENCOUNTER — Telehealth: Payer: Self-pay

## 2024-03-23 NOTE — Telephone Encounter (Signed)
 4 boxes of Ozempic  2mg  dose pens arrived 03/11/24

## 2024-03-26 ENCOUNTER — Encounter (HOSPITAL_BASED_OUTPATIENT_CLINIC_OR_DEPARTMENT_OTHER): Attending: Internal Medicine | Admitting: Internal Medicine

## 2024-03-26 ENCOUNTER — Telehealth: Payer: Self-pay

## 2024-03-26 DIAGNOSIS — Z872 Personal history of diseases of the skin and subcutaneous tissue: Secondary | ICD-10-CM | POA: Insufficient documentation

## 2024-03-26 DIAGNOSIS — Z09 Encounter for follow-up examination after completed treatment for conditions other than malignant neoplasm: Secondary | ICD-10-CM | POA: Insufficient documentation

## 2024-03-26 DIAGNOSIS — Z85828 Personal history of other malignant neoplasm of skin: Secondary | ICD-10-CM | POA: Diagnosis not present

## 2024-03-26 DIAGNOSIS — E11622 Type 2 diabetes mellitus with other skin ulcer: Secondary | ICD-10-CM | POA: Diagnosis present

## 2024-04-02 ENCOUNTER — Encounter (HOSPITAL_BASED_OUTPATIENT_CLINIC_OR_DEPARTMENT_OTHER): Admitting: General Surgery

## 2024-04-02 DIAGNOSIS — I872 Venous insufficiency (chronic) (peripheral): Secondary | ICD-10-CM | POA: Diagnosis not present

## 2024-04-02 DIAGNOSIS — Z872 Personal history of diseases of the skin and subcutaneous tissue: Secondary | ICD-10-CM | POA: Diagnosis not present

## 2024-04-02 DIAGNOSIS — E11622 Type 2 diabetes mellitus with other skin ulcer: Secondary | ICD-10-CM | POA: Diagnosis not present

## 2024-04-02 DIAGNOSIS — Z09 Encounter for follow-up examination after completed treatment for conditions other than malignant neoplasm: Secondary | ICD-10-CM | POA: Diagnosis not present

## 2024-04-02 DIAGNOSIS — Z85828 Personal history of other malignant neoplasm of skin: Secondary | ICD-10-CM | POA: Diagnosis not present

## 2024-04-02 DIAGNOSIS — L97815 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 without evidence of necrosis: Secondary | ICD-10-CM | POA: Diagnosis not present

## 2024-04-05 ENCOUNTER — Other Ambulatory Visit: Payer: Self-pay

## 2024-04-05 DIAGNOSIS — R4586 Emotional lability: Secondary | ICD-10-CM

## 2024-04-06 MED ORDER — SERTRALINE HCL 100 MG PO TABS: 100.0000 mg | ORAL_TABLET | Freq: Every day | ORAL | 1 refills | Status: AC

## 2024-04-08 ENCOUNTER — Telehealth: Payer: Self-pay

## 2024-04-08 NOTE — Telephone Encounter (Signed)
 Patients spouse calls nurse line requesting a referral for wound care.   He reports they noticed (2) separate areas on her right lower extremity that have opened up.   He reports no drainage, except for occasional blood.   He denies any fevers, chills or malodorous odors.  He reports both area started out as bruises. He reports she is unaware if trauma to the areas occurred.   Patient scheduled for tomorrow for evaluation.  Precautions discussed for worsening symptoms.

## 2024-04-09 ENCOUNTER — Ambulatory Visit (INDEPENDENT_AMBULATORY_CARE_PROVIDER_SITE_OTHER): Admitting: Student

## 2024-04-09 ENCOUNTER — Ambulatory Visit (HOSPITAL_BASED_OUTPATIENT_CLINIC_OR_DEPARTMENT_OTHER): Admitting: Internal Medicine

## 2024-04-09 VITALS — BP 113/51 | HR 64 | Ht 68.0 in | Wt 162.0 lb

## 2024-04-09 DIAGNOSIS — Z9884 Bariatric surgery status: Secondary | ICD-10-CM

## 2024-04-09 DIAGNOSIS — C4492 Squamous cell carcinoma of skin, unspecified: Secondary | ICD-10-CM | POA: Diagnosis not present

## 2024-04-09 NOTE — Progress Notes (Unsigned)
    SUBJECTIVE:   CHIEF COMPLAINT / HPI:   68 year old female with history of squamous cell cancer on her left lower extremity presenting today with daughter to request referral to wound care.  Patient reports after excision of the squamous cell she has been managed by months.  Left lower leg was initially dressed and provide 4 weeks.  Recently dressings were removed however last week like a referral to wound care to continue treatment/management.  Denies any pain, fever or chills.  PERTINENT  PMH / PSH: Reviewed   OBJECTIVE:   BP (!) 113/51   Pulse 64   Ht 5' 8 (1.727 m)   Wt 162 lb (73.5 kg)   SpO2 100%   BMI 24.63 kg/m    Physical Exam General: Alert, well appearing, NAD Cardiovascular: RRR, No Murmurs, Normal S2/S2 Respiratory: CTAB, No wheezing or Rales Abdomen: No distension or tenderness Extremities: LLE abrasion, active bleed or draining.  ASSESSMENT/PLAN:   Cancer of skin, squamous cell LLE lesion doesn't appear infected. Placed referral to wound care for continued treatment.  S/P bariatric surgery-duodenal switch with sleeve gastrectomy Place referral to [facility] for management of patient postop supplements and medication     Norleen April, MD [facility]

## 2024-04-09 NOTE — Patient Instructions (Addendum)
 Pleasure to meet you today.  Sent in referral for bariatric surgery to [facility].  Please make sure to look out for a call from them.  I have also sent in referral for wound care.  You can call them to schedule an appointment for her to be seen.  Her leg does not look infected I recommend making sure to keep the wound area clean and can use Vaseline to keep the surface moisturized.

## 2024-04-10 NOTE — Assessment & Plan Note (Signed)
 LLE lesion doesn't appear infected. Placed referral to wound care for continued treatment.

## 2024-04-10 NOTE — Assessment & Plan Note (Addendum)
 Place referral to [facility] Bariatric clinic for management of patient postop supplements and medication

## 2024-04-12 NOTE — Telephone Encounter (Signed)
 Patient presents to clinic. Provided medication shipment to patient.   Chiquita JAYSON English, RN

## 2024-04-13 ENCOUNTER — Other Ambulatory Visit: Payer: Self-pay

## 2024-04-13 MED ORDER — ATORVASTATIN CALCIUM 40 MG PO TABS: 40.0000 mg | ORAL_TABLET | Freq: Every day | ORAL | 3 refills | Status: AC

## 2024-04-16 ENCOUNTER — Ambulatory Visit (HOSPITAL_BASED_OUTPATIENT_CLINIC_OR_DEPARTMENT_OTHER): Admitting: General Surgery

## 2024-04-16 ENCOUNTER — Encounter (HOSPITAL_BASED_OUTPATIENT_CLINIC_OR_DEPARTMENT_OTHER): Admitting: General Surgery

## 2024-04-16 DIAGNOSIS — L97815 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 without evidence of necrosis: Secondary | ICD-10-CM | POA: Diagnosis not present

## 2024-04-16 DIAGNOSIS — I872 Venous insufficiency (chronic) (peripheral): Secondary | ICD-10-CM | POA: Diagnosis not present

## 2024-04-16 DIAGNOSIS — Z09 Encounter for follow-up examination after completed treatment for conditions other than malignant neoplasm: Secondary | ICD-10-CM | POA: Diagnosis not present

## 2024-04-16 DIAGNOSIS — Z85828 Personal history of other malignant neoplasm of skin: Secondary | ICD-10-CM | POA: Diagnosis not present

## 2024-04-16 DIAGNOSIS — Z872 Personal history of diseases of the skin and subcutaneous tissue: Secondary | ICD-10-CM | POA: Diagnosis not present

## 2024-04-23 NOTE — Telephone Encounter (Signed)
 NO 2026 RENEWAL FOR OZEMPIC 

## 2024-04-26 ENCOUNTER — Encounter: Payer: Self-pay | Admitting: Radiology

## 2024-05-05 ENCOUNTER — Ambulatory Visit (INDEPENDENT_AMBULATORY_CARE_PROVIDER_SITE_OTHER)

## 2024-05-05 VITALS — BP 128/64 | HR 73 | Wt 162.0 lb

## 2024-05-05 DIAGNOSIS — E1140 Type 2 diabetes mellitus with diabetic neuropathy, unspecified: Secondary | ICD-10-CM | POA: Diagnosis present

## 2024-05-05 DIAGNOSIS — I152 Hypertension secondary to endocrine disorders: Secondary | ICD-10-CM | POA: Diagnosis not present

## 2024-05-05 DIAGNOSIS — Z23 Encounter for immunization: Secondary | ICD-10-CM

## 2024-05-05 DIAGNOSIS — E1159 Type 2 diabetes mellitus with other circulatory complications: Secondary | ICD-10-CM | POA: Diagnosis not present

## 2024-05-05 DIAGNOSIS — B379 Candidiasis, unspecified: Secondary | ICD-10-CM | POA: Diagnosis not present

## 2024-05-05 LAB — POCT GLYCOSYLATED HEMOGLOBIN (HGB A1C): HbA1c, POC (controlled diabetic range): 5.7 % (ref 0.0–7.0)

## 2024-05-05 MED ORDER — NYSTATIN 100000 UNIT/GM EX OINT: TOPICAL_OINTMENT | Freq: Two times a day (BID) | CUTANEOUS | Status: AC

## 2024-05-05 NOTE — Patient Instructions (Signed)
 Your A1c was 5.7 today. This is fantastic. This put you in the pre-diabetes range. Since your A1c is doing so well, we will stop you semaglutide  today. We will also stop you spironolactone , your blood pressure medicine. Please check it at home, in the morning before any caffeine. Please come back and see me in 1-2 weeks to recheck your blood pressure.

## 2024-05-05 NOTE — Progress Notes (Signed)
    SUBJECTIVE:   CHIEF COMPLAINT / HPI: Diabetes follow-up  Patient presents today for follow-up on her diabetes.  Patient has been on semaglutide  and has been tolerating it well.  Her daughter has been helping cook for her.  At this visit her daughter states that she will help mail prep for her mom on the weekends.  She often makes a fish or chicken with vegetables on the side.  Her daughter is also introduced some supplements including fish oil, turmeric, sea moss, black seed oil, and Lions main.  [name] has been doing well.  She states she has an appointment in December with the bariatric surgeon for her Teduglutide .  This is something she has been trying to get set up for quite some time.  Her 1 complaint today is that the rash around her bellybutton is still present.  She was previously prescribed a topical steroid which did not help.  She states it somewhat itches and that it smells poorly.  PERTINENT  PMH / PSH: Diabetes, history of bariatric surgery, Alzheimer's  OBJECTIVE:   BP 128/64   Pulse 73   Wt 162 lb (73.5 kg)   SpO2 99%   BMI 24.63 kg/m   General: Well appearing, NAD Cardiovascular: RRR, no M/R/G Respiratory: CTAB, normal work of breathing on room air Abdomen: Bowel sounds present, soft, nontender to palpation, nondistended Skin: Erythematous periumbilical rash with irregular borders and satellite lesions  ASSESSMENT/PLAN:   Assessment & Plan Type 2 diabetes, controlled, with neuropathy (HCC) A1c 5.7 today.  Patient only on semaglutide .  Has been eating a healthy diet with help from her daughter. - Discontinued semaglutide  today as patient's A1c has been so well-controlled - Encouraged patient to continue her current diet Yeast infection Erythematous periumbilical rash with irregular borders and satellite lesions. - Prescribed nystatin ointment Hypertension associated with diabetes (HCC) Blood pressure 128/64 today.  Patient currently on spironolactone . -  Discontinue spironolactone  today - Patient's daughter said she would be able to help take her blood pressures at home, advised them to check it in the morning prior to having any caffeine - Follow-up in 1 to 2 weeks for blood pressure recheck Encounter for immunization Flu shot administered today.     [name] KANDICE Lee, DO [facility]

## 2024-05-06 ENCOUNTER — Telehealth: Payer: Self-pay

## 2024-05-06 DIAGNOSIS — B379 Candidiasis, unspecified: Secondary | ICD-10-CM

## 2024-05-06 MED ORDER — NYSTATIN-TRIAMCINOLONE 100000-0.1 UNIT/GM-% EX OINT
1.0000 | TOPICAL_OINTMENT | Freq: Two times a day (BID) | CUTANEOUS | 0 refills | Status: AC
Start: 2024-05-06 — End: ?

## 2024-05-06 NOTE — Telephone Encounter (Signed)
 Patient calls nurse line in regards to Nystatin prescription.   She reports she was not able to pick up this up at the pharmacy. She reports she was told no prescription was sent in.   It appears the prescription was sent in as clinic administered and not electronically to the pharmacy.   Will forward to PCP to resend in.

## 2024-05-07 NOTE — Assessment & Plan Note (Signed)
 Blood pressure 128/64 today.  Patient currently on spironolactone . - Discontinue spironolactone  today - Patient's daughter said she would be able to help take her blood pressures at home, advised them to check it in the morning prior to having any caffeine - Follow-up in 1 to 2 weeks for blood pressure recheck

## 2024-05-07 NOTE — Assessment & Plan Note (Signed)
 A1c 5.7 today.  Patient only on semaglutide .  Has been eating a healthy diet with help from her daughter. - Discontinued semaglutide  today as patient's A1c has been so well-controlled - Encouraged patient to continue her current diet

## 2024-05-14 ENCOUNTER — Ambulatory Visit: Admitting: Family Medicine

## 2024-05-14 VITALS — BP 138/83 | HR 69 | Wt 162.0 lb

## 2024-05-14 DIAGNOSIS — B309 Viral conjunctivitis, unspecified: Secondary | ICD-10-CM

## 2024-05-14 NOTE — Progress Notes (Signed)
    SUBJECTIVE:   CHIEF COMPLAINT / HPI:   Pink Eye - 4-5 days eye hurting and then started to turn pink - Crusting in the morning - No itching or tearing - Cough, runny nose as well - Has tried OTC eye drops - Denies any fevers  PERTINENT  PMH / PSH: Reviewed  OBJECTIVE:   BP 138/83   Pulse 69   Wt 162 lb (73.5 kg)   SpO2 100%   BMI 24.63 kg/m   General: Awake and Alert in NAD HEENT: NCAT. Bilateral conjunctivitis. No rhinorrhea. Cardiovascular: RRR. No M/R/G Respiratory: CTAB, normal WOB on RA. No wheezing, crackles, rhonchi, or diminished breath sounds.  ASSESSMENT/PLAN:   Assessment & Plan Viral conjunctivitis Bilateral conjunctivitis likely secondary to viral causes with associated symptoms. - Supportive care instructions provided in AVS: cool compresses, eye drops, and proper hand hygiene   Kathrine Melena, DO [facility]

## 2024-05-14 NOTE — Patient Instructions (Addendum)
 It was great to see you today! Thank you for choosing [facility] for your primary care. [name] was seen for pink eye.  Today we addressed: Pink eye - normally viral and treated supportively Cool compresses: Apply a clean, cool, lint-free cloth to your closed eyelids to soothe irritation and inflammation.  Artificial tears: Use over-the-counter lubricating eye drops to relieve dryness and irritation. Keeping them in the refrigerator can make them feel more soothing.  Hygiene: Wash your hands frequently to avoid spreading the infection to your other eye or other people. Avoid touching your eyes, and do not share towels, washcloths, or other personal items.   You should return to our clinic Return if symptoms worsen or fail to improve. Please arrive 15 minutes before your appointment to ensure smooth check in process.  We appreciate your efforts in making this happen.  Thank you for allowing me to participate in your care, Kathrine Melena, DO 05/14/2024, 3:14 PM PGY-2, [facility]

## 2024-05-17 ENCOUNTER — Ambulatory Visit

## 2024-05-17 VITALS — BP 121/55 | HR 71 | Temp 97.4°F | Ht 68.0 in | Wt 160.6 lb

## 2024-05-17 DIAGNOSIS — J019 Acute sinusitis, unspecified: Secondary | ICD-10-CM | POA: Diagnosis not present

## 2024-05-17 DIAGNOSIS — H44003 Unspecified purulent endophthalmitis, bilateral: Secondary | ICD-10-CM | POA: Diagnosis not present

## 2024-05-17 DIAGNOSIS — I152 Hypertension secondary to endocrine disorders: Secondary | ICD-10-CM

## 2024-05-17 DIAGNOSIS — E1159 Type 2 diabetes mellitus with other circulatory complications: Secondary | ICD-10-CM | POA: Diagnosis not present

## 2024-05-17 MED ORDER — ERYTHROMYCIN 5 MG/GM OP OINT: 1.0000 | TOPICAL_OINTMENT | Freq: Four times a day (QID) | OPHTHALMIC | 0 refills | Status: AC

## 2024-05-17 MED ORDER — DOXYCYCLINE HYCLATE 100 MG PO TABS: 100.0000 mg | ORAL_TABLET | Freq: Two times a day (BID) | ORAL | 0 refills | Status: AC

## 2024-05-17 NOTE — Progress Notes (Signed)
    SUBJECTIVE:   CHIEF COMPLAINT / HPI: BP f/u, eye drainage, sore throat  Patient's daughter helps her with her medications and has been checking it at home. Patient states it has been doing well at home.   2 weeks ago she started having drainage from her left eye, which then moved to her right eye. She now has yellow-green drainage from both eyes. She states it is not just in the morning, but throughout the day. She has been using warm compresses at home without improvement. Her eyes are somewhat painful and she has intermitted blurry vision. She also has floaters. She states she used to get shots in her eyes.   During these last two weeks she has also had a runny nose with mostly clear discharge, but some coloration. She endorses a sore throat and a sensation of her throat closing that comes and goes. Pain with swallowing, denies difficulty swallowing or choking. She has had a cough as well with intermittent brown phlegm. She smoked from 26 to 67 years old. She states she does not typically have a cough. Denies SOB, fevers, N/V. Has chronic diarrhea.    PERTINENT  PMH / PSH: HTN  OBJECTIVE:   BP (!) 145/72   Pulse 79   Ht 5' 8 (1.727 m)   Wt 160 lb 9.6 oz (72.8 kg)   SpO2 99%   BMI 24.42 kg/m   General: ill-appearing, NAD HEENT: head atraumatic, normocephalic, eyes injected, yellow-green dried discharge present on bilateral eyes, ears with moderate cerumen, nasal passages erythematous/edematous, throat erythematous without tonsillar swelling Neck: no lymphadenopathy  Cardiovascular: RRR, no M/R/G Respiratory: CTAB, normal work of breathing on room air   ASSESSMENT/PLAN:   Assessment & Plan Acute non-recurrent sinusitis, unspecified location Eye infection, bilateral - Ordered Doxycyline 100 mg BID for 5 days  - Ordered Erythromycin  ophthalmic ointment  Hypertension associated with diabetes (HCC) F/u after discontinuing spironolactone . Blood pressure well controlled today.   - Will continue without spironolactone  at this time      Raguel KANDICE Lee, DO [facility]

## 2024-05-17 NOTE — Patient Instructions (Addendum)
 Hello, today we re-checked your blood pressure and it is doing great today. We do not need to restart your medicine at this time.   You are also feeling sick today. I have sent in an ointment you can put on your eyes every 6 hours. Please use this until they are doing better.  For your other symptoms, I am going to send in an antibiotic called doxycyline.   For ear wax, you can buy Debrox over the counter to help loosen it. It is not recommended to use Q-tips as they can push wax in further.

## 2024-05-17 NOTE — Assessment & Plan Note (Signed)
 F/u after discontinuing spironolactone . Blood pressure well controlled today.  - Will continue without spironolactone  at this time

## 2024-05-27 DIAGNOSIS — Z800 Family history of malignant neoplasm of digestive organs: Secondary | ICD-10-CM | POA: Diagnosis not present

## 2024-05-27 DIAGNOSIS — D508 Other iron deficiency anemias: Secondary | ICD-10-CM | POA: Diagnosis not present

## 2024-05-27 DIAGNOSIS — Z860100 Personal history of colon polyps, unspecified: Secondary | ICD-10-CM | POA: Diagnosis not present

## 2024-05-27 DIAGNOSIS — K90829 Short bowel syndrome, unspecified: Secondary | ICD-10-CM | POA: Diagnosis not present

## 2024-05-27 DIAGNOSIS — Z9884 Bariatric surgery status: Secondary | ICD-10-CM | POA: Diagnosis not present

## 2024-05-27 DIAGNOSIS — R152 Fecal urgency: Secondary | ICD-10-CM | POA: Diagnosis not present

## 2024-05-28 ENCOUNTER — Ambulatory Visit: Attending: Cardiology

## 2024-05-28 DIAGNOSIS — I441 Atrioventricular block, second degree: Secondary | ICD-10-CM | POA: Diagnosis not present

## 2024-05-29 ENCOUNTER — Other Ambulatory Visit: Payer: Self-pay | Admitting: Family Medicine

## 2024-05-29 DIAGNOSIS — R4586 Emotional lability: Secondary | ICD-10-CM

## 2024-05-31 LAB — CUP PACEART REMOTE DEVICE CHECK
Battery Remaining Longevity: 96 mo
Battery Voltage: 3 V
Brady Statistic RV Percent Paced: 5.73 %
Date Time Interrogation Session: 20251205155900
Implantable Pulse Generator Implant Date: 20210817
Lead Channel Impedance Value: 410 Ohm
Lead Channel Pacing Threshold Amplitude: 0.5 V
Lead Channel Pacing Threshold Pulse Width: 0.4 ms
Lead Channel Sensing Intrinsic Amplitude: 16.538 mV
Lead Channel Setting Pacing Amplitude: 1 V
Lead Channel Setting Pacing Pulse Width: 0.4 ms
Lead Channel Setting Sensing Sensitivity: 2 mV

## 2024-06-01 NOTE — Progress Notes (Signed)
 Remote PPM Transmission

## 2024-06-03 NOTE — Telephone Encounter (Signed)
 Daughter confirmed that the patient is still taking the medication.

## 2024-06-11 ENCOUNTER — Ambulatory Visit: Payer: Self-pay | Admitting: Cardiology

## 2024-07-09 ENCOUNTER — Ambulatory Visit: Payer: Self-pay

## 2024-07-09 VITALS — BP 140/70 | HR 75 | Wt 179.0 lb

## 2024-07-09 DIAGNOSIS — Q644 Malformation of urachus: Secondary | ICD-10-CM

## 2024-07-09 NOTE — Progress Notes (Signed)
" ° ° °  SUBJECTIVE:   CHIEF COMPLAINT / HPI: Bellybutton rash  Patient presents today with her daughter who assists in her care.  She has had a bellybutton rash present since at least July 2025 when I first saw her in clinic.  Per chart review, she was seen on December 15, 2023 and at that time she stated she had had the umbilical issue for about 7 months.  She has tried clotrimazole  cream and recently nystatin -triamcinolone  ointment.  She states even when using the nystatin -triamcinolone  3-4 times a day there is no improvement in her bellybutton rash.  She also has 2 lesions up into the left of her bellybutton that she states showed up maybe a month ago.  PERTINENT  PMH / PSH: S/p bariatric surgery, Alzheimer's  OBJECTIVE:   BP (!) 140/70   Pulse 75   Wt 81.2 kg   SpO2 100%   BMI 27.22 kg/m   General: Well-appearing, NAD Cardiovascular: RRR, no murmurs, rubs, or gallops Respiratory: CTAB, normal work of breathing on room air Skin: Periumbilical rash, erythematous, well-demarcated, with pinpoint areas that appear to be eroded/ulcerative, brown slough appreciated around umbilicus rash, cephalad and left sided lateral to the umbilicus or to separate lesions that appear erythematous with central scabbing    ASSESSMENT/PLAN:   Assessment & Plan Urachal cyst Given persistent nonhealing periumbilical rash, concern for urachal cyst.  Has failed clotrimazole  cream and nystatin -triamcinolone  ointment. - Will call radiology about best imaging modality - Will call patient or patient's daughter to update them about recommended imaging      Raguel KANDICE Lee, DO [facility] "

## 2024-07-09 NOTE — Patient Instructions (Signed)
 I am going to call radiology and figure out what type of imaging would be best to figure out what is going on with your belly button. Once I hear from them I will call you to let you know and we will make sure you have a time to get the scan.

## 2024-07-13 ENCOUNTER — Telehealth: Payer: Self-pay

## 2024-07-13 DIAGNOSIS — Q644 Malformation of urachus: Secondary | ICD-10-CM

## 2024-07-13 NOTE — Telephone Encounter (Signed)
 Spoke with Radiologist at [facility] today about best imaging modality for [name] given concern for urachal fistula. CTAP with contrast was recommended as the best imaging modality.   I called [name]' daughter, [name], to inform her of this and let her know I would be ordering the CT scan.

## 2024-07-21 ENCOUNTER — Ambulatory Visit: Admitting: Podiatry

## 2024-07-23 ENCOUNTER — Telehealth: Payer: Self-pay | Admitting: Cardiology

## 2024-07-23 NOTE — Telephone Encounter (Signed)
 Pt c/o medication issue:  1. Name of Medication: ELIQUIS  5 MG TABS tablet   2. How are you currently taking this medication (dosage and times per day)? N/A  3. Are you having a reaction (difficulty breathing--STAT)? No  4. What is your medication issue? Pt requesting a different medication that's more affordable or would like some samples and the pt assistance program.

## 2024-07-23 NOTE — Telephone Encounter (Signed)
 Patient called in - she is scheduled with [facility] EP APP for 2/3.

## 2024-07-27 ENCOUNTER — Encounter: Payer: Self-pay | Admitting: Pulmonary Disease

## 2024-07-27 ENCOUNTER — Ambulatory Visit: Admitting: Medical

## 2024-07-27 VITALS — BP 120/74 | HR 86 | Ht 68.0 in | Wt 164.0 lb

## 2024-07-27 DIAGNOSIS — Z950 Presence of cardiac pacemaker: Secondary | ICD-10-CM

## 2024-07-27 DIAGNOSIS — I4891 Unspecified atrial fibrillation: Secondary | ICD-10-CM

## 2024-07-27 DIAGNOSIS — I441 Atrioventricular block, second degree: Secondary | ICD-10-CM

## 2024-07-27 DIAGNOSIS — I480 Paroxysmal atrial fibrillation: Secondary | ICD-10-CM

## 2024-07-27 DIAGNOSIS — Z79899 Other long term (current) drug therapy: Secondary | ICD-10-CM

## 2024-07-27 DIAGNOSIS — E1159 Type 2 diabetes mellitus with other circulatory complications: Secondary | ICD-10-CM

## 2024-07-27 LAB — CUP PACEART INCLINIC DEVICE CHECK
Battery Remaining Longevity: 96 mo
Battery Voltage: 3 V
Brady Statistic AS VP Percent: 93.8 %
Brady Statistic RV Percent Paced: 6.22 %
Date Time Interrogation Session: 20260203142528
Implantable Pulse Generator Implant Date: 20210817
Lead Channel Impedance Value: 430 Ohm
Lead Channel Pacing Threshold Amplitude: 0.5 V
Lead Channel Pacing Threshold Pulse Width: 0.4 ms
Lead Channel Sensing Intrinsic Amplitude: 23.288 mV
Lead Channel Setting Pacing Amplitude: 1 V
Lead Channel Setting Pacing Pulse Width: 0.4 ms
Lead Channel Setting Sensing Sensitivity: 2 mV

## 2024-07-27 LAB — BASIC METABOLIC PANEL WITH GFR
BUN/Creatinine Ratio: 23 (ref 12–28)
BUN: 33 mg/dL — ABNORMAL HIGH (ref 8–27)
CO2: 23 mmol/L (ref 20–29)
Calcium: 9.1 mg/dL (ref 8.7–10.3)
Chloride: 103 mmol/L (ref 96–106)
Creatinine, Ser: 1.42 mg/dL — ABNORMAL HIGH (ref 0.57–1.00)
Glucose: 83 mg/dL (ref 70–99)
Potassium: 4.6 mmol/L (ref 3.5–5.2)
Sodium: 140 mmol/L (ref 134–144)
eGFR: 40 mL/min/{1.73_m2} — ABNORMAL LOW

## 2024-07-27 LAB — MAGNESIUM: Magnesium: 2.3 mg/dL (ref 1.6–2.3)

## 2024-07-27 MED ORDER — DABIGATRAN ETEXILATE MESYLATE 150 MG PO CAPS: 150.0000 mg | ORAL_CAPSULE | Freq: Two times a day (BID) | ORAL | 11 refills | Status: AC

## 2024-07-28 ENCOUNTER — Other Ambulatory Visit: Payer: Self-pay | Admitting: Family Medicine

## 2024-07-29 ENCOUNTER — Ambulatory Visit: Payer: Self-pay | Admitting: Medical

## 2024-07-30 ENCOUNTER — Inpatient Hospital Stay: Admission: RE | Admit: 2024-07-30

## 2024-08-03 ENCOUNTER — Other Ambulatory Visit

## 2024-08-05 ENCOUNTER — Ambulatory Visit

## 2024-08-12 ENCOUNTER — Encounter (HOSPITAL_BASED_OUTPATIENT_CLINIC_OR_DEPARTMENT_OTHER): Admitting: General Surgery

## 2024-08-23 ENCOUNTER — Ambulatory Visit: Payer: Self-pay

## 2024-08-27 ENCOUNTER — Ambulatory Visit

## 2024-11-04 ENCOUNTER — Ambulatory Visit

## 2024-11-08 ENCOUNTER — Ambulatory Visit: Admitting: Adult Health

## 2024-11-26 ENCOUNTER — Ambulatory Visit

## 2025-01-17 ENCOUNTER — Encounter

## 2025-02-03 ENCOUNTER — Ambulatory Visit

## 2025-02-25 ENCOUNTER — Ambulatory Visit

## 2025-05-05 ENCOUNTER — Ambulatory Visit

## 2025-05-27 ENCOUNTER — Ambulatory Visit

## 2025-08-04 ENCOUNTER — Ambulatory Visit
